# Patient Record
Sex: Male | Born: 1945 | Race: White | Hispanic: No | Marital: Married | Smoking: Former smoker
Health system: Southern US, Community
[De-identification: ages and names within clinical notes are randomized; demographics above are authoritative.]

## PROBLEM LIST (undated history)

## (undated) DIAGNOSIS — C3490 Malignant neoplasm of unspecified part of unspecified bronchus or lung: Secondary | ICD-10-CM

## (undated) DIAGNOSIS — Z9289 Personal history of other medical treatment: Secondary | ICD-10-CM

## (undated) DIAGNOSIS — IMO0001 Reserved for inherently not codable concepts without codable children: Secondary | ICD-10-CM

## (undated) DIAGNOSIS — M1990 Unspecified osteoarthritis, unspecified site: Secondary | ICD-10-CM

## (undated) DIAGNOSIS — IMO0002 Reserved for concepts with insufficient information to code with codable children: Secondary | ICD-10-CM

## (undated) DIAGNOSIS — J189 Pneumonia, unspecified organism: Secondary | ICD-10-CM

## (undated) DIAGNOSIS — R42 Dizziness and giddiness: Principal | ICD-10-CM

## (undated) DIAGNOSIS — H269 Unspecified cataract: Secondary | ICD-10-CM

## (undated) DIAGNOSIS — I10 Essential (primary) hypertension: Secondary | ICD-10-CM

## (undated) DIAGNOSIS — S83209A Unspecified tear of unspecified meniscus, current injury, unspecified knee, initial encounter: Secondary | ICD-10-CM

## (undated) DIAGNOSIS — D6481 Anemia due to antineoplastic chemotherapy: Secondary | ICD-10-CM

## (undated) DIAGNOSIS — T451X5A Adverse effect of antineoplastic and immunosuppressive drugs, initial encounter: Secondary | ICD-10-CM

## (undated) DIAGNOSIS — R1310 Dysphagia, unspecified: Secondary | ICD-10-CM

## (undated) DIAGNOSIS — I951 Orthostatic hypotension: Secondary | ICD-10-CM

## (undated) DIAGNOSIS — R269 Unspecified abnormalities of gait and mobility: Secondary | ICD-10-CM

## (undated) DIAGNOSIS — Z931 Gastrostomy status: Secondary | ICD-10-CM

## (undated) DIAGNOSIS — H9190 Unspecified hearing loss, unspecified ear: Secondary | ICD-10-CM

## (undated) DIAGNOSIS — N400 Enlarged prostate without lower urinary tract symptoms: Secondary | ICD-10-CM

## (undated) DIAGNOSIS — R251 Tremor, unspecified: Secondary | ICD-10-CM

## (undated) DIAGNOSIS — N179 Acute kidney failure, unspecified: Secondary | ICD-10-CM

## (undated) DIAGNOSIS — A419 Sepsis, unspecified organism: Secondary | ICD-10-CM

## (undated) HISTORY — PX: PORTACATH PLACEMENT: SHX2246

## (undated) HISTORY — PX: LARYNGOSCOPY: SUR817

## (undated) HISTORY — DX: Reserved for concepts with insufficient information to code with codable children: IMO0002

## (undated) HISTORY — PX: CATARACT EXTRACTION: SUR2

## (undated) HISTORY — DX: Pneumonia, unspecified organism: J18.9

## (undated) HISTORY — DX: Reserved for inherently not codable concepts without codable children: IMO0001

## (undated) HISTORY — DX: Unspecified hearing loss, unspecified ear: H91.90

## (undated) HISTORY — DX: Unspecified abnormalities of gait and mobility: R26.9

## (undated) HISTORY — PX: OTHER SURGICAL HISTORY: SHX169

## (undated) HISTORY — DX: Anemia due to antineoplastic chemotherapy: D64.81

## (undated) HISTORY — DX: Dizziness and giddiness: R42

## (undated) HISTORY — PX: TONSILLECTOMY: SUR1361

## (undated) HISTORY — DX: Unspecified osteoarthritis, unspecified site: M19.90

## (undated) HISTORY — DX: Tremor, unspecified: R25.1

## (undated) HISTORY — PX: BILE DUCT STENT PLACEMENT: SHX1227

## (undated) HISTORY — DX: Adverse effect of antineoplastic and immunosuppressive drugs, initial encounter: T45.1X5A

## (undated) HISTORY — DX: Sepsis, unspecified organism: A41.9

---

## 2003-08-28 ENCOUNTER — Encounter (INDEPENDENT_AMBULATORY_CARE_PROVIDER_SITE_OTHER): Payer: Self-pay | Admitting: *Deleted

## 2003-08-28 ENCOUNTER — Ambulatory Visit (HOSPITAL_COMMUNITY): Admission: RE | Admit: 2003-08-28 | Discharge: 2003-08-28 | Payer: Self-pay | Admitting: Gastroenterology

## 2006-03-29 HISTORY — PX: ROTATOR CUFF REPAIR: SHX139

## 2007-02-16 ENCOUNTER — Inpatient Hospital Stay (HOSPITAL_COMMUNITY): Admission: EM | Admit: 2007-02-16 | Discharge: 2007-02-18 | Payer: Self-pay | Admitting: Emergency Medicine

## 2007-02-16 HISTORY — PX: OTHER SURGICAL HISTORY: SHX169

## 2007-02-17 ENCOUNTER — Encounter (INDEPENDENT_AMBULATORY_CARE_PROVIDER_SITE_OTHER): Payer: Self-pay | Admitting: General Surgery

## 2008-12-05 ENCOUNTER — Ambulatory Visit (HOSPITAL_BASED_OUTPATIENT_CLINIC_OR_DEPARTMENT_OTHER): Admission: RE | Admit: 2008-12-05 | Discharge: 2008-12-05 | Payer: Self-pay | Admitting: Family Medicine

## 2008-12-05 ENCOUNTER — Encounter: Payer: Self-pay | Admitting: Pulmonary Disease

## 2008-12-05 ENCOUNTER — Ambulatory Visit: Payer: Self-pay | Admitting: Diagnostic Radiology

## 2008-12-10 DIAGNOSIS — M129 Arthropathy, unspecified: Secondary | ICD-10-CM | POA: Insufficient documentation

## 2008-12-11 ENCOUNTER — Ambulatory Visit: Payer: Self-pay | Admitting: Pulmonary Disease

## 2008-12-11 DIAGNOSIS — R599 Enlarged lymph nodes, unspecified: Secondary | ICD-10-CM | POA: Insufficient documentation

## 2008-12-11 DIAGNOSIS — C3492 Malignant neoplasm of unspecified part of left bronchus or lung: Secondary | ICD-10-CM

## 2008-12-17 ENCOUNTER — Ambulatory Visit (HOSPITAL_COMMUNITY): Admission: RE | Admit: 2008-12-17 | Discharge: 2008-12-17 | Payer: Self-pay | Admitting: Pulmonary Disease

## 2008-12-19 ENCOUNTER — Telehealth: Payer: Self-pay | Admitting: Internal Medicine

## 2008-12-20 ENCOUNTER — Ambulatory Visit: Payer: Self-pay | Admitting: Internal Medicine

## 2008-12-23 ENCOUNTER — Encounter: Payer: Self-pay | Admitting: Pulmonary Disease

## 2008-12-23 ENCOUNTER — Encounter (INDEPENDENT_AMBULATORY_CARE_PROVIDER_SITE_OTHER): Payer: Self-pay | Admitting: Interventional Radiology

## 2008-12-23 ENCOUNTER — Ambulatory Visit (HOSPITAL_COMMUNITY): Admission: RE | Admit: 2008-12-23 | Discharge: 2008-12-23 | Payer: Self-pay | Admitting: Pulmonary Disease

## 2008-12-26 ENCOUNTER — Ambulatory Visit: Payer: Self-pay | Admitting: Emergency Medicine

## 2008-12-26 ENCOUNTER — Encounter: Payer: Self-pay | Admitting: Pulmonary Disease

## 2008-12-26 ENCOUNTER — Ambulatory Visit: Payer: Self-pay | Admitting: Internal Medicine

## 2008-12-27 ENCOUNTER — Encounter: Payer: Self-pay | Admitting: Pulmonary Disease

## 2008-12-30 ENCOUNTER — Ambulatory Visit: Admission: RE | Admit: 2008-12-30 | Discharge: 2009-02-11 | Payer: Self-pay | Admitting: Radiation Oncology

## 2008-12-31 ENCOUNTER — Ambulatory Visit (HOSPITAL_COMMUNITY): Admission: RE | Admit: 2008-12-31 | Discharge: 2008-12-31 | Payer: Self-pay | Admitting: Internal Medicine

## 2008-12-31 ENCOUNTER — Encounter (INDEPENDENT_AMBULATORY_CARE_PROVIDER_SITE_OTHER): Payer: Self-pay | Admitting: Interventional Radiology

## 2008-12-31 ENCOUNTER — Encounter: Payer: Self-pay | Admitting: Pulmonary Disease

## 2009-01-01 ENCOUNTER — Ambulatory Visit (HOSPITAL_COMMUNITY): Admission: RE | Admit: 2009-01-01 | Discharge: 2009-01-01 | Payer: Self-pay | Admitting: Internal Medicine

## 2009-01-02 LAB — COMPREHENSIVE METABOLIC PANEL
ALT: 18 U/L (ref 0–53)
AST: 14 U/L (ref 0–37)
Albumin: 4 g/dL (ref 3.5–5.2)
BUN: 12 mg/dL (ref 6–23)
CO2: 25 mEq/L (ref 19–32)
Calcium: 9.4 mg/dL (ref 8.4–10.5)
Chloride: 102 mEq/L (ref 96–112)
Potassium: 4.2 mEq/L (ref 3.5–5.3)

## 2009-01-02 LAB — CBC WITH DIFFERENTIAL/PLATELET
BASO%: 0.6 % (ref 0.0–2.0)
Basophils Absolute: 0 10*3/uL (ref 0.0–0.1)
EOS%: 1.4 % (ref 0.0–7.0)
HCT: 33.1 % — ABNORMAL LOW (ref 38.4–49.9)
HGB: 11.3 g/dL — ABNORMAL LOW (ref 13.0–17.1)
MCH: 30.2 pg (ref 27.2–33.4)
MONO#: 1 10*3/uL — ABNORMAL HIGH (ref 0.1–0.9)
RDW: 12.6 % (ref 11.0–14.6)
WBC: 8 10*3/uL (ref 4.0–10.3)
lymph#: 1.5 10*3/uL (ref 0.9–3.3)

## 2009-01-03 ENCOUNTER — Ambulatory Visit: Payer: Self-pay | Admitting: Pulmonary Disease

## 2009-01-06 ENCOUNTER — Encounter: Payer: Self-pay | Admitting: Pulmonary Disease

## 2009-01-06 LAB — CBC WITH DIFFERENTIAL/PLATELET
BASO%: 0.1 % (ref 0.0–2.0)
EOS%: 0 % (ref 0.0–7.0)
HCT: 33.8 % — ABNORMAL LOW (ref 38.4–49.9)
MCH: 29.5 pg (ref 27.2–33.4)
MCHC: 33.7 g/dL (ref 32.0–36.0)
MONO#: 0.8 10*3/uL (ref 0.1–0.9)
RBC: 3.87 10*6/uL — ABNORMAL LOW (ref 4.20–5.82)
RDW: 12.8 % (ref 11.0–14.6)
WBC: 13.9 10*3/uL — ABNORMAL HIGH (ref 4.0–10.3)
lymph#: 1 10*3/uL (ref 0.9–3.3)
nRBC: 0 % (ref 0–0)

## 2009-01-06 LAB — COMPREHENSIVE METABOLIC PANEL
AST: 15 U/L (ref 0–37)
Albumin: 4.3 g/dL (ref 3.5–5.2)
Alkaline Phosphatase: 115 U/L (ref 39–117)
Potassium: 4.3 mEq/L (ref 3.5–5.3)
Sodium: 138 mEq/L (ref 135–145)
Total Bilirubin: 0.2 mg/dL — ABNORMAL LOW (ref 0.3–1.2)
Total Protein: 7.5 g/dL (ref 6.0–8.3)

## 2009-01-06 LAB — UA PROTEIN, DIPSTICK - CHCC: Protein, Urine: NEGATIVE mg/dL

## 2009-01-13 LAB — COMPREHENSIVE METABOLIC PANEL
AST: 23 U/L (ref 0–37)
Albumin: 3.4 g/dL — ABNORMAL LOW (ref 3.5–5.2)
Alkaline Phosphatase: 94 U/L (ref 39–117)
BUN: 14 mg/dL (ref 6–23)
Glucose, Bld: 117 mg/dL — ABNORMAL HIGH (ref 70–99)
Potassium: 3.7 mEq/L (ref 3.5–5.3)
Sodium: 134 mEq/L — ABNORMAL LOW (ref 135–145)
Total Bilirubin: 0.5 mg/dL (ref 0.3–1.2)

## 2009-01-13 LAB — CBC WITH DIFFERENTIAL/PLATELET
Basophils Absolute: 0 10*3/uL (ref 0.0–0.1)
EOS%: 8.6 % — ABNORMAL HIGH (ref 0.0–7.0)
Eosinophils Absolute: 0.4 10*3/uL (ref 0.0–0.5)
LYMPH%: 26.9 % (ref 14.0–49.0)
MCH: 30.1 pg (ref 27.2–33.4)
MCV: 86.4 fL (ref 79.3–98.0)
MONO%: 13.7 % (ref 0.0–14.0)
NEUT#: 2.1 10*3/uL (ref 1.5–6.5)
Platelets: 256 10*3/uL (ref 140–400)
RBC: 3.8 10*6/uL — ABNORMAL LOW (ref 4.20–5.82)
RDW: 12.7 % (ref 11.0–14.6)

## 2009-01-16 ENCOUNTER — Ambulatory Visit (HOSPITAL_COMMUNITY): Admission: RE | Admit: 2009-01-16 | Discharge: 2009-01-16 | Payer: Self-pay | Admitting: Internal Medicine

## 2009-01-20 ENCOUNTER — Ambulatory Visit: Payer: Self-pay | Admitting: Internal Medicine

## 2009-01-20 LAB — CBC WITH DIFFERENTIAL/PLATELET
Basophils Absolute: 0 10*3/uL (ref 0.0–0.1)
Eosinophils Absolute: 0.3 10*3/uL (ref 0.0–0.5)
HCT: 32 % — ABNORMAL LOW (ref 38.4–49.9)
HGB: 10.9 g/dL — ABNORMAL LOW (ref 13.0–17.1)
LYMPH%: 21.3 % (ref 14.0–49.0)
MCV: 88.3 fL (ref 79.3–98.0)
MONO#: 0.5 10*3/uL (ref 0.1–0.9)
MONO%: 10.1 % (ref 0.0–14.0)
NEUT#: 3 10*3/uL (ref 1.5–6.5)
Platelets: 111 10*3/uL — ABNORMAL LOW (ref 140–400)
WBC: 4.8 10*3/uL (ref 4.0–10.3)

## 2009-01-20 LAB — COMPREHENSIVE METABOLIC PANEL
Albumin: 3.7 g/dL (ref 3.5–5.2)
Alkaline Phosphatase: 99 U/L (ref 39–117)
BUN: 13 mg/dL (ref 6–23)
CO2: 26 mEq/L (ref 19–32)
Glucose, Bld: 123 mg/dL — ABNORMAL HIGH (ref 70–99)
Potassium: 3.8 mEq/L (ref 3.5–5.3)
Sodium: 138 mEq/L (ref 135–145)
Total Bilirubin: 0.5 mg/dL (ref 0.3–1.2)
Total Protein: 6.4 g/dL (ref 6.0–8.3)

## 2009-01-28 ENCOUNTER — Encounter: Payer: Self-pay | Admitting: Pulmonary Disease

## 2009-01-28 LAB — COMPREHENSIVE METABOLIC PANEL
Albumin: 4.5 g/dL (ref 3.5–5.2)
Alkaline Phosphatase: 74 U/L (ref 39–117)
BUN: 16 mg/dL (ref 6–23)
Creatinine, Ser: 0.72 mg/dL (ref 0.40–1.50)
Glucose, Bld: 164 mg/dL — ABNORMAL HIGH (ref 70–99)
Total Bilirubin: 0.4 mg/dL (ref 0.3–1.2)

## 2009-01-28 LAB — CBC WITH DIFFERENTIAL/PLATELET
Basophils Absolute: 0 10*3/uL (ref 0.0–0.1)
Eosinophils Absolute: 0 10*3/uL (ref 0.0–0.5)
HCT: 34.5 % — ABNORMAL LOW (ref 38.4–49.9)
HGB: 11.6 g/dL — ABNORMAL LOW (ref 13.0–17.1)
MCV: 87.1 fL (ref 79.3–98.0)
MONO%: 4.2 % (ref 0.0–14.0)
NEUT#: 5.3 10*3/uL (ref 1.5–6.5)
NEUT%: 89.7 % — ABNORMAL HIGH (ref 39.0–75.0)
Platelets: 186 10*3/uL (ref 140–400)
RDW: 15.1 % — ABNORMAL HIGH (ref 11.0–14.6)

## 2009-01-29 ENCOUNTER — Encounter: Payer: Self-pay | Admitting: Pulmonary Disease

## 2009-01-31 ENCOUNTER — Ambulatory Visit: Payer: Self-pay | Admitting: Internal Medicine

## 2009-02-04 LAB — COMPREHENSIVE METABOLIC PANEL
Alkaline Phosphatase: 74 U/L (ref 39–117)
BUN: 13 mg/dL (ref 6–23)
Glucose, Bld: 97 mg/dL (ref 70–99)
Sodium: 134 mEq/L — ABNORMAL LOW (ref 135–145)
Total Bilirubin: 0.7 mg/dL (ref 0.3–1.2)

## 2009-02-04 LAB — CBC WITH DIFFERENTIAL/PLATELET
Basophils Absolute: 0 10*3/uL (ref 0.0–0.1)
Eosinophils Absolute: 0.2 10*3/uL (ref 0.0–0.5)
LYMPH%: 25.6 % (ref 14.0–49.0)
MCV: 87.7 fL (ref 79.3–98.0)
MONO%: 16.9 % — ABNORMAL HIGH (ref 0.0–14.0)
NEUT#: 1.4 10*3/uL — ABNORMAL LOW (ref 1.5–6.5)
Platelets: 121 10*3/uL — ABNORMAL LOW (ref 140–400)
RBC: 3.69 10*6/uL — ABNORMAL LOW (ref 4.20–5.82)

## 2009-02-11 LAB — CBC WITH DIFFERENTIAL/PLATELET
Basophils Absolute: 0 10*3/uL (ref 0.0–0.1)
Eosinophils Absolute: 0.5 10*3/uL (ref 0.0–0.5)
HCT: 30.7 % — ABNORMAL LOW (ref 38.4–49.9)
HGB: 10.7 g/dL — ABNORMAL LOW (ref 13.0–17.1)
LYMPH%: 16.1 % (ref 14.0–49.0)
MCV: 87.8 fL (ref 79.3–98.0)
MONO%: 13.2 % (ref 0.0–14.0)
NEUT#: 2.8 10*3/uL (ref 1.5–6.5)
Platelets: 100 10*3/uL — ABNORMAL LOW (ref 140–400)

## 2009-02-11 LAB — UA PROTEIN, DIPSTICK - CHCC: Protein, Urine: NEGATIVE mg/dL

## 2009-02-11 LAB — COMPREHENSIVE METABOLIC PANEL
Albumin: 4 g/dL (ref 3.5–5.2)
Alkaline Phosphatase: 65 U/L (ref 39–117)
BUN: 14 mg/dL (ref 6–23)
CO2: 25 mEq/L (ref 19–32)
Glucose, Bld: 122 mg/dL — ABNORMAL HIGH (ref 70–99)
Total Bilirubin: 0.6 mg/dL (ref 0.3–1.2)

## 2009-02-13 ENCOUNTER — Encounter: Payer: Self-pay | Admitting: Pulmonary Disease

## 2009-02-18 ENCOUNTER — Encounter: Payer: Self-pay | Admitting: Pulmonary Disease

## 2009-02-18 LAB — CBC WITH DIFFERENTIAL/PLATELET
BASO%: 0.1 % (ref 0.0–2.0)
Eosinophils Absolute: 0 10*3/uL (ref 0.0–0.5)
HCT: 31.6 % — ABNORMAL LOW (ref 38.4–49.9)
LYMPH%: 6.2 % — ABNORMAL LOW (ref 14.0–49.0)
MONO#: 0.3 10*3/uL (ref 0.1–0.9)
NEUT#: 4.7 10*3/uL (ref 1.5–6.5)
Platelets: 217 10*3/uL (ref 140–400)
RBC: 3.49 10*6/uL — ABNORMAL LOW (ref 4.20–5.82)
WBC: 5.3 10*3/uL (ref 4.0–10.3)
lymph#: 0.3 10*3/uL — ABNORMAL LOW (ref 0.9–3.3)

## 2009-02-18 LAB — COMPREHENSIVE METABOLIC PANEL
ALT: 44 U/L (ref 0–53)
Albumin: 4.7 g/dL (ref 3.5–5.2)
CO2: 19 mEq/L (ref 19–32)
Calcium: 10.1 mg/dL (ref 8.4–10.5)
Chloride: 104 mEq/L (ref 96–112)
Glucose, Bld: 165 mg/dL — ABNORMAL HIGH (ref 70–99)
Sodium: 139 mEq/L (ref 135–145)
Total Bilirubin: 0.4 mg/dL (ref 0.3–1.2)
Total Protein: 6.8 g/dL (ref 6.0–8.3)

## 2009-02-18 LAB — UA PROTEIN, DIPSTICK - CHCC: Protein, Urine: NEGATIVE mg/dL

## 2009-02-25 LAB — COMPREHENSIVE METABOLIC PANEL
AST: 23 U/L (ref 0–37)
Albumin: 4.1 g/dL (ref 3.5–5.2)
BUN: 18 mg/dL (ref 6–23)
CO2: 25 mEq/L (ref 19–32)
Calcium: 9.8 mg/dL (ref 8.4–10.5)
Chloride: 105 mEq/L (ref 96–112)
Creatinine, Ser: 0.9 mg/dL (ref 0.40–1.50)
Glucose, Bld: 103 mg/dL — ABNORMAL HIGH (ref 70–99)
Potassium: 3.8 mEq/L (ref 3.5–5.3)

## 2009-02-25 LAB — CBC WITH DIFFERENTIAL/PLATELET
Basophils Absolute: 0 10*3/uL (ref 0.0–0.1)
EOS%: 8.8 % — ABNORMAL HIGH (ref 0.0–7.0)
Eosinophils Absolute: 0.3 10*3/uL (ref 0.0–0.5)
HCT: 30.9 % — ABNORMAL LOW (ref 38.4–49.9)
HGB: 11 g/dL — ABNORMAL LOW (ref 13.0–17.1)
MCH: 31.7 pg (ref 27.2–33.4)
MONO#: 0.5 10*3/uL (ref 0.1–0.9)
NEUT#: 1.3 10*3/uL — ABNORMAL LOW (ref 1.5–6.5)
NEUT%: 45.6 % (ref 39.0–75.0)
RDW: 20.4 % — ABNORMAL HIGH (ref 11.0–14.6)
lymph#: 0.8 10*3/uL — ABNORMAL LOW (ref 0.9–3.3)

## 2009-02-25 LAB — UA PROTEIN, DIPSTICK - CHCC: Protein, Urine: NEGATIVE mg/dL

## 2009-03-04 ENCOUNTER — Ambulatory Visit: Payer: Self-pay | Admitting: Internal Medicine

## 2009-03-04 LAB — CBC WITH DIFFERENTIAL/PLATELET
BASO%: 0.1 % (ref 0.0–2.0)
LYMPH%: 13.7 % — ABNORMAL LOW (ref 14.0–49.0)
MCH: 32.5 pg (ref 27.2–33.4)
MCHC: 35.5 g/dL (ref 32.0–36.0)
MCV: 91.6 fL (ref 79.3–98.0)
MONO%: 13.4 % (ref 0.0–14.0)
NEUT%: 64.4 % (ref 39.0–75.0)
Platelets: 114 10*3/uL — ABNORMAL LOW (ref 140–400)
RBC: 3.12 10*6/uL — ABNORMAL LOW (ref 4.20–5.82)

## 2009-03-04 LAB — COMPREHENSIVE METABOLIC PANEL
ALT: 38 U/L (ref 0–53)
Alkaline Phosphatase: 61 U/L (ref 39–117)
Creatinine, Ser: 1.29 mg/dL (ref 0.40–1.50)
Sodium: 140 mEq/L (ref 135–145)
Total Bilirubin: 0.6 mg/dL (ref 0.3–1.2)
Total Protein: 6.3 g/dL (ref 6.0–8.3)

## 2009-03-07 ENCOUNTER — Emergency Department (HOSPITAL_COMMUNITY): Admission: EM | Admit: 2009-03-07 | Discharge: 2009-03-07 | Payer: Self-pay | Admitting: Emergency Medicine

## 2009-03-11 ENCOUNTER — Ambulatory Visit (HOSPITAL_COMMUNITY): Admission: RE | Admit: 2009-03-11 | Discharge: 2009-03-11 | Payer: Self-pay | Admitting: Internal Medicine

## 2009-03-12 ENCOUNTER — Encounter: Payer: Self-pay | Admitting: Pulmonary Disease

## 2009-03-12 LAB — CBC WITH DIFFERENTIAL/PLATELET
Eosinophils Absolute: 0 10*3/uL (ref 0.0–0.5)
HCT: 29 % — ABNORMAL LOW (ref 38.4–49.9)
LYMPH%: 9.2 % — ABNORMAL LOW (ref 14.0–49.0)
MCV: 93.2 fL (ref 79.3–98.0)
MONO%: 10.4 % (ref 0.0–14.0)
NEUT#: 4.7 10*3/uL (ref 1.5–6.5)
NEUT%: 80.2 % — ABNORMAL HIGH (ref 39.0–75.0)
Platelets: 240 10*3/uL (ref 140–400)
RBC: 3.11 10*6/uL — ABNORMAL LOW (ref 4.20–5.82)
nRBC: 0 % (ref 0–0)

## 2009-03-12 LAB — COMPREHENSIVE METABOLIC PANEL
AST: 24 U/L (ref 0–37)
Albumin: 3.9 g/dL (ref 3.5–5.2)
BUN: 16 mg/dL (ref 6–23)
CO2: 25 mEq/L (ref 19–32)
Calcium: 9.8 mg/dL (ref 8.4–10.5)
Chloride: 103 mEq/L (ref 96–112)
Creatinine, Ser: 0.83 mg/dL (ref 0.40–1.50)
Glucose, Bld: 150 mg/dL — ABNORMAL HIGH (ref 70–99)
Potassium: 4 mEq/L (ref 3.5–5.3)

## 2009-03-18 LAB — CBC WITH DIFFERENTIAL/PLATELET
Basophils Absolute: 0 10*3/uL (ref 0.0–0.1)
EOS%: 7.5 % — ABNORMAL HIGH (ref 0.0–7.0)
Eosinophils Absolute: 0.3 10*3/uL (ref 0.0–0.5)
HCT: 27.2 % — ABNORMAL LOW (ref 38.4–49.9)
HGB: 9.6 g/dL — ABNORMAL LOW (ref 13.0–17.1)
MONO#: 0.3 10*3/uL (ref 0.1–0.9)
NEUT#: 2.2 10*3/uL (ref 1.5–6.5)
NEUT%: 62.8 % (ref 39.0–75.0)
RDW: 24 % — ABNORMAL HIGH (ref 11.0–14.6)
WBC: 3.6 10*3/uL — ABNORMAL LOW (ref 4.0–10.3)
lymph#: 0.7 10*3/uL — ABNORMAL LOW (ref 0.9–3.3)

## 2009-03-18 LAB — COMPREHENSIVE METABOLIC PANEL
AST: 21 U/L (ref 0–37)
Albumin: 3.8 g/dL (ref 3.5–5.2)
BUN: 12 mg/dL (ref 6–23)
CO2: 27 mEq/L (ref 19–32)
Calcium: 9.4 mg/dL (ref 8.4–10.5)
Chloride: 108 mEq/L (ref 96–112)
Creatinine, Ser: 0.76 mg/dL (ref 0.40–1.50)
Potassium: 3.9 mEq/L (ref 3.5–5.3)

## 2009-03-18 LAB — UA PROTEIN, DIPSTICK - CHCC: Protein, Urine: NEGATIVE mg/dL

## 2009-03-26 LAB — COMPREHENSIVE METABOLIC PANEL
ALT: 46 U/L (ref 0–53)
AST: 31 U/L (ref 0–37)
Albumin: 4 g/dL (ref 3.5–5.2)
Alkaline Phosphatase: 75 U/L (ref 39–117)
BUN: 9 mg/dL (ref 6–23)
CO2: 22 mEq/L (ref 19–32)
Calcium: 8.6 mg/dL (ref 8.4–10.5)
Chloride: 106 mEq/L (ref 96–112)
Creatinine, Ser: 0.82 mg/dL (ref 0.40–1.50)
Glucose, Bld: 99 mg/dL (ref 70–99)
Potassium: 4.2 mEq/L (ref 3.5–5.3)
Sodium: 139 mEq/L (ref 135–145)
Total Bilirubin: 0.6 mg/dL (ref 0.3–1.2)
Total Protein: 5.8 g/dL — ABNORMAL LOW (ref 6.0–8.3)

## 2009-03-26 LAB — CBC WITH DIFFERENTIAL/PLATELET
EOS%: 11.3 % — ABNORMAL HIGH (ref 0.0–7.0)
HGB: 10.2 g/dL — ABNORMAL LOW (ref 13.0–17.1)
MCH: 34.4 pg — ABNORMAL HIGH (ref 27.2–33.4)
MCV: 96.9 fL (ref 79.3–98.0)
MONO%: 13.1 % (ref 0.0–14.0)
NEUT#: 2.3 10*3/uL (ref 1.5–6.5)
RBC: 2.96 10*6/uL — ABNORMAL LOW (ref 4.20–5.82)
RDW: 24.2 % — ABNORMAL HIGH (ref 11.0–14.6)
lymph#: 0.5 10*3/uL — ABNORMAL LOW (ref 0.9–3.3)

## 2009-03-27 ENCOUNTER — Ambulatory Visit: Payer: Self-pay | Admitting: Internal Medicine

## 2009-04-02 ENCOUNTER — Encounter: Payer: Self-pay | Admitting: Pulmonary Disease

## 2009-04-02 LAB — COMPREHENSIVE METABOLIC PANEL
Albumin: 4.1 g/dL (ref 3.5–5.2)
Alkaline Phosphatase: 75 U/L (ref 39–117)
BUN: 15 mg/dL (ref 6–23)
CO2: 23 mEq/L (ref 19–32)
Calcium: 8.8 mg/dL (ref 8.4–10.5)
Chloride: 100 mEq/L (ref 96–112)
Glucose, Bld: 147 mg/dL — ABNORMAL HIGH (ref 70–99)
Potassium: 4.1 mEq/L (ref 3.5–5.3)
Sodium: 134 mEq/L — ABNORMAL LOW (ref 135–145)
Total Protein: 5.9 g/dL — ABNORMAL LOW (ref 6.0–8.3)

## 2009-04-02 LAB — CBC WITH DIFFERENTIAL/PLATELET
EOS%: 0.2 % (ref 0.0–7.0)
Eosinophils Absolute: 0 10*3/uL (ref 0.0–0.5)
MCH: 35.4 pg — ABNORMAL HIGH (ref 27.2–33.4)
MCV: 99.5 fL — ABNORMAL HIGH (ref 79.3–98.0)
MONO%: 9.9 % (ref 0.0–14.0)
NEUT#: 5.1 10*3/uL (ref 1.5–6.5)
RBC: 2.81 10*6/uL — ABNORMAL LOW (ref 4.20–5.82)
RDW: 24.7 % — ABNORMAL HIGH (ref 11.0–14.6)
lymph#: 0.5 10*3/uL — ABNORMAL LOW (ref 0.9–3.3)

## 2009-04-02 LAB — UA PROTEIN, DIPSTICK - CHCC: Protein, Urine: NEGATIVE mg/dL

## 2009-04-08 LAB — CBC WITH DIFFERENTIAL/PLATELET
BASO%: 0.5 % (ref 0.0–2.0)
Eosinophils Absolute: 0.5 10*3/uL (ref 0.0–0.5)
MCHC: 34.6 g/dL (ref 32.0–36.0)
MONO#: 0.3 10*3/uL (ref 0.1–0.9)
NEUT#: 2.2 10*3/uL (ref 1.5–6.5)
RBC: 2.89 10*6/uL — ABNORMAL LOW (ref 4.20–5.82)
WBC: 3.8 10*3/uL — ABNORMAL LOW (ref 4.0–10.3)
lymph#: 0.8 10*3/uL — ABNORMAL LOW (ref 0.9–3.3)

## 2009-04-08 LAB — COMPREHENSIVE METABOLIC PANEL
ALT: 17 U/L (ref 0–53)
Albumin: 3.9 g/dL (ref 3.5–5.2)
CO2: 24 mEq/L (ref 19–32)
Calcium: 9.3 mg/dL (ref 8.4–10.5)
Chloride: 108 mEq/L (ref 96–112)
Potassium: 3.7 mEq/L (ref 3.5–5.3)
Sodium: 143 mEq/L (ref 135–145)
Total Protein: 5.7 g/dL — ABNORMAL LOW (ref 6.0–8.3)

## 2009-04-08 LAB — UA PROTEIN, DIPSTICK - CHCC: Protein, Urine: NEGATIVE mg/dL

## 2009-04-15 ENCOUNTER — Inpatient Hospital Stay (HOSPITAL_COMMUNITY): Admission: EM | Admit: 2009-04-15 | Discharge: 2009-04-17 | Payer: Self-pay | Admitting: Emergency Medicine

## 2009-04-23 ENCOUNTER — Encounter: Payer: Self-pay | Admitting: Pulmonary Disease

## 2009-04-23 LAB — CBC WITH DIFFERENTIAL/PLATELET
BASO%: 0.2 % (ref 0.0–2.0)
EOS%: 0 % (ref 0.0–7.0)
HCT: 27.8 % — ABNORMAL LOW (ref 38.4–49.9)
MCH: 33.3 pg (ref 27.2–33.4)
MCHC: 33.1 g/dL (ref 32.0–36.0)
NEUT%: 82.9 % — ABNORMAL HIGH (ref 39.0–75.0)
RBC: 2.76 10*6/uL — ABNORMAL LOW (ref 4.20–5.82)
RDW: 17.4 % — ABNORMAL HIGH (ref 11.0–14.6)
lymph#: 0.4 10*3/uL — ABNORMAL LOW (ref 0.9–3.3)

## 2009-04-23 LAB — COMPREHENSIVE METABOLIC PANEL
Alkaline Phosphatase: 78 U/L (ref 39–117)
Glucose, Bld: 164 mg/dL — ABNORMAL HIGH (ref 70–99)
Sodium: 138 mEq/L (ref 135–145)
Total Bilirubin: 0.4 mg/dL (ref 0.3–1.2)
Total Protein: 6 g/dL (ref 6.0–8.3)

## 2009-04-28 ENCOUNTER — Ambulatory Visit: Payer: Self-pay | Admitting: Internal Medicine

## 2009-04-30 LAB — CBC WITH DIFFERENTIAL/PLATELET
Basophils Absolute: 0 10*3/uL (ref 0.0–0.1)
EOS%: 5.7 % (ref 0.0–7.0)
HCT: 28 % — ABNORMAL LOW (ref 38.4–49.9)
HGB: 10 g/dL — ABNORMAL LOW (ref 13.0–17.1)
MCH: 36.2 pg — ABNORMAL HIGH (ref 27.2–33.4)
MCV: 101.8 fL — ABNORMAL HIGH (ref 79.3–98.0)
NEUT%: 53.3 % (ref 39.0–75.0)
lymph#: 0.8 10*3/uL — ABNORMAL LOW (ref 0.9–3.3)

## 2009-04-30 LAB — UA PROTEIN, DIPSTICK - CHCC: Protein, Urine: NEGATIVE mg/dL

## 2009-04-30 LAB — COMPREHENSIVE METABOLIC PANEL
AST: 18 U/L (ref 0–37)
BUN: 22 mg/dL (ref 6–23)
Calcium: 10 mg/dL (ref 8.4–10.5)
Chloride: 105 mEq/L (ref 96–112)
Creatinine, Ser: 0.87 mg/dL (ref 0.40–1.50)
Glucose, Bld: 93 mg/dL (ref 70–99)

## 2009-05-07 LAB — CBC WITH DIFFERENTIAL/PLATELET
BASO%: 0.6 % (ref 0.0–2.0)
Eosinophils Absolute: 0.3 10*3/uL (ref 0.0–0.5)
MONO#: 0.7 10*3/uL (ref 0.1–0.9)
NEUT#: 2 10*3/uL (ref 1.5–6.5)
Platelets: 81 10*3/uL — ABNORMAL LOW (ref 140–400)
RBC: 2.57 10*6/uL — ABNORMAL LOW (ref 4.20–5.82)
RDW: 15.3 % — ABNORMAL HIGH (ref 11.0–14.6)
WBC: 3.8 10*3/uL — ABNORMAL LOW (ref 4.0–10.3)
lymph#: 0.8 10*3/uL — ABNORMAL LOW (ref 0.9–3.3)

## 2009-05-07 LAB — COMPREHENSIVE METABOLIC PANEL
ALT: 25 U/L (ref 0–53)
AST: 21 U/L (ref 0–37)
Albumin: 4.1 g/dL (ref 3.5–5.2)
Alkaline Phosphatase: 84 U/L (ref 39–117)
Glucose, Bld: 71 mg/dL (ref 70–99)
Potassium: 3.3 mEq/L — ABNORMAL LOW (ref 3.5–5.3)
Sodium: 141 mEq/L (ref 135–145)
Total Bilirubin: 0.5 mg/dL (ref 0.3–1.2)
Total Protein: 6.2 g/dL (ref 6.0–8.3)

## 2009-05-07 LAB — UA PROTEIN, DIPSTICK - CHCC: Protein, Urine: NEGATIVE mg/dL

## 2009-05-13 ENCOUNTER — Encounter: Payer: Self-pay | Admitting: Pulmonary Disease

## 2009-05-13 LAB — CBC WITH DIFFERENTIAL/PLATELET
Basophils Absolute: 0 10*3/uL (ref 0.0–0.1)
EOS%: 0 % (ref 0.0–7.0)
Eosinophils Absolute: 0 10*3/uL (ref 0.0–0.5)
HCT: 27.1 % — ABNORMAL LOW (ref 38.4–49.9)
HGB: 9.2 g/dL — ABNORMAL LOW (ref 13.0–17.1)
MCH: 34.5 pg — ABNORMAL HIGH (ref 27.2–33.4)
MCV: 101.5 fL — ABNORMAL HIGH (ref 79.3–98.0)
MONO%: 4.5 % (ref 0.0–14.0)
NEUT#: 3.9 10*3/uL (ref 1.5–6.5)
NEUT%: 87.9 % — ABNORMAL HIGH (ref 39.0–75.0)
RDW: 17.3 % — ABNORMAL HIGH (ref 11.0–14.6)

## 2009-05-13 LAB — COMPREHENSIVE METABOLIC PANEL
AST: 29 U/L (ref 0–37)
Albumin: 4.6 g/dL (ref 3.5–5.2)
Alkaline Phosphatase: 74 U/L (ref 39–117)
BUN: 13 mg/dL (ref 6–23)
Creatinine, Ser: 0.8 mg/dL (ref 0.40–1.50)
Glucose, Bld: 111 mg/dL — ABNORMAL HIGH (ref 70–99)
Total Bilirubin: 0.5 mg/dL (ref 0.3–1.2)

## 2009-05-21 LAB — CBC WITH DIFFERENTIAL/PLATELET
Eosinophils Absolute: 0.1 10*3/uL (ref 0.0–0.5)
HCT: 28.2 % — ABNORMAL LOW (ref 38.4–49.9)
LYMPH%: 38 % (ref 14.0–49.0)
MCHC: 34.8 g/dL (ref 32.0–36.0)
MCV: 99.6 fL — ABNORMAL HIGH (ref 79.3–98.0)
MONO#: 0.6 10*3/uL (ref 0.1–0.9)
MONO%: 26.8 % — ABNORMAL HIGH (ref 0.0–14.0)
NEUT#: 0.6 10*3/uL — ABNORMAL LOW (ref 1.5–6.5)
NEUT%: 28.3 % — ABNORMAL LOW (ref 39.0–75.0)
Platelets: 120 10*3/uL — ABNORMAL LOW (ref 140–400)
WBC: 2.1 10*3/uL — ABNORMAL LOW (ref 4.0–10.3)

## 2009-05-21 LAB — COMPREHENSIVE METABOLIC PANEL
BUN: 18 mg/dL (ref 6–23)
CO2: 27 mEq/L (ref 19–32)
Calcium: 9.8 mg/dL (ref 8.4–10.5)
Chloride: 102 mEq/L (ref 96–112)
Creatinine, Ser: 0.9 mg/dL (ref 0.40–1.50)
Total Bilirubin: 0.5 mg/dL (ref 0.3–1.2)

## 2009-05-30 ENCOUNTER — Ambulatory Visit: Payer: Self-pay | Admitting: Internal Medicine

## 2009-06-03 ENCOUNTER — Encounter: Payer: Self-pay | Admitting: Pulmonary Disease

## 2009-06-03 LAB — CBC WITH DIFFERENTIAL/PLATELET
BASO%: 0 % (ref 0.0–2.0)
LYMPH%: 8.4 % — ABNORMAL LOW (ref 14.0–49.0)
MCHC: 34.5 g/dL (ref 32.0–36.0)
MONO#: 0.3 10*3/uL (ref 0.1–0.9)
Platelets: 341 10*3/uL (ref 140–400)
RBC: 2.95 10*6/uL — ABNORMAL LOW (ref 4.20–5.82)
RDW: 17.7 % — ABNORMAL HIGH (ref 11.0–14.6)
WBC: 4.8 10*3/uL (ref 4.0–10.3)

## 2009-06-03 LAB — COMPREHENSIVE METABOLIC PANEL
ALT: 19 U/L (ref 0–53)
AST: 21 U/L (ref 0–37)
Calcium: 10.4 mg/dL (ref 8.4–10.5)
Chloride: 100 mEq/L (ref 96–112)
Creatinine, Ser: 0.93 mg/dL (ref 0.40–1.50)
Total Bilirubin: 0.4 mg/dL (ref 0.3–1.2)

## 2009-06-03 LAB — UA PROTEIN, DIPSTICK - CHCC: Protein, Urine: NEGATIVE mg/dL

## 2009-06-25 LAB — UA PROTEIN, DIPSTICK - CHCC: Protein, Urine: NEGATIVE mg/dL

## 2009-06-25 LAB — CBC WITH DIFFERENTIAL/PLATELET
Eosinophils Absolute: 0 10*3/uL (ref 0.0–0.5)
HCT: 30.9 % — ABNORMAL LOW (ref 38.4–49.9)
LYMPH%: 6.8 % — ABNORMAL LOW (ref 14.0–49.0)
MONO#: 0.2 10*3/uL (ref 0.1–0.9)
NEUT#: 6.5 10*3/uL (ref 1.5–6.5)
NEUT%: 89.8 % — ABNORMAL HIGH (ref 39.0–75.0)
Platelets: 253 10*3/uL (ref 140–400)
WBC: 7.2 10*3/uL (ref 4.0–10.3)

## 2009-06-25 LAB — COMPREHENSIVE METABOLIC PANEL
CO2: 23 mEq/L (ref 19–32)
Creatinine, Ser: 0.94 mg/dL (ref 0.40–1.50)
Glucose, Bld: 177 mg/dL — ABNORMAL HIGH (ref 70–99)
Total Bilirubin: 0.3 mg/dL (ref 0.3–1.2)

## 2009-07-14 ENCOUNTER — Ambulatory Visit (HOSPITAL_COMMUNITY): Admission: RE | Admit: 2009-07-14 | Discharge: 2009-07-14 | Payer: Self-pay | Admitting: Internal Medicine

## 2009-07-14 ENCOUNTER — Ambulatory Visit: Payer: Self-pay | Admitting: Internal Medicine

## 2009-07-16 ENCOUNTER — Encounter: Payer: Self-pay | Admitting: Pulmonary Disease

## 2009-07-16 LAB — COMPREHENSIVE METABOLIC PANEL
ALT: 24 U/L (ref 0–53)
Alkaline Phosphatase: 66 U/L (ref 39–117)
CO2: 23 mEq/L (ref 19–32)
Sodium: 137 mEq/L (ref 135–145)
Total Bilirubin: 0.5 mg/dL (ref 0.3–1.2)
Total Protein: 6.9 g/dL (ref 6.0–8.3)

## 2009-07-16 LAB — CBC WITH DIFFERENTIAL/PLATELET
BASO%: 0 % (ref 0.0–2.0)
LYMPH%: 8.7 % — ABNORMAL LOW (ref 14.0–49.0)
MCHC: 33 g/dL (ref 32.0–36.0)
MCV: 101.2 fL — ABNORMAL HIGH (ref 79.3–98.0)
MONO%: 3.4 % (ref 0.0–14.0)
Platelets: 285 10*3/uL (ref 140–400)
RBC: 3.23 10*6/uL — ABNORMAL LOW (ref 4.20–5.82)

## 2009-07-16 LAB — UA PROTEIN, DIPSTICK - CHCC: Protein, Urine: NEGATIVE mg/dL

## 2009-08-06 LAB — COMPREHENSIVE METABOLIC PANEL
AST: 33 U/L (ref 0–37)
Albumin: 4.2 g/dL (ref 3.5–5.2)
BUN: 13 mg/dL (ref 6–23)
CO2: 30 mEq/L (ref 19–32)
Calcium: 9.7 mg/dL (ref 8.4–10.5)
Chloride: 101 mEq/L (ref 96–112)
Glucose, Bld: 154 mg/dL — ABNORMAL HIGH (ref 70–99)
Potassium: 3.7 mEq/L (ref 3.5–5.3)

## 2009-08-06 LAB — CBC WITH DIFFERENTIAL/PLATELET
BASO%: 0.2 % (ref 0.0–2.0)
Basophils Absolute: 0 10*3/uL (ref 0.0–0.1)
EOS%: 0 % (ref 0.0–7.0)
HGB: 11.1 g/dL — ABNORMAL LOW (ref 13.0–17.1)
MCH: 33.8 pg — ABNORMAL HIGH (ref 27.2–33.4)
MONO#: 0 10*3/uL — ABNORMAL LOW (ref 0.1–0.9)
RDW: 15.4 % — ABNORMAL HIGH (ref 11.0–14.6)
WBC: 4.6 10*3/uL (ref 4.0–10.3)
lymph#: 0.3 10*3/uL — ABNORMAL LOW (ref 0.9–3.3)

## 2009-08-26 ENCOUNTER — Ambulatory Visit: Payer: Self-pay | Admitting: Internal Medicine

## 2009-08-27 LAB — COMPREHENSIVE METABOLIC PANEL
ALT: 31 U/L (ref 0–53)
AST: 26 U/L (ref 0–37)
CO2: 28 mEq/L (ref 19–32)
Calcium: 10.1 mg/dL (ref 8.4–10.5)
Chloride: 104 mEq/L (ref 96–112)
Creatinine, Ser: 1.12 mg/dL (ref 0.40–1.50)
Sodium: 140 mEq/L (ref 135–145)
Total Bilirubin: 0.7 mg/dL (ref 0.3–1.2)
Total Protein: 6.7 g/dL (ref 6.0–8.3)

## 2009-08-27 LAB — UA PROTEIN, DIPSTICK - CHCC: Protein, Urine: NEGATIVE mg/dL

## 2009-08-27 LAB — CBC WITH DIFFERENTIAL/PLATELET
BASO%: 0.2 % (ref 0.0–2.0)
EOS%: 0 % (ref 0.0–7.0)
MCH: 33.6 pg — ABNORMAL HIGH (ref 27.2–33.4)
MCV: 100.6 fL — ABNORMAL HIGH (ref 79.3–98.0)
MONO%: 7.8 % (ref 0.0–14.0)
NEUT#: 4.6 10*3/uL (ref 1.5–6.5)
RBC: 3.39 10*6/uL — ABNORMAL LOW (ref 4.20–5.82)
RDW: 15.9 % — ABNORMAL HIGH (ref 11.0–14.6)

## 2009-09-12 ENCOUNTER — Ambulatory Visit (HOSPITAL_COMMUNITY): Admission: RE | Admit: 2009-09-12 | Discharge: 2009-09-12 | Payer: Self-pay | Admitting: Internal Medicine

## 2009-09-12 ENCOUNTER — Encounter: Payer: Self-pay | Admitting: Pulmonary Disease

## 2009-09-17 ENCOUNTER — Encounter: Payer: Self-pay | Admitting: Pulmonary Disease

## 2009-09-17 LAB — COMPREHENSIVE METABOLIC PANEL
AST: 21 U/L (ref 0–37)
Alkaline Phosphatase: 66 U/L (ref 39–117)
BUN: 21 mg/dL (ref 6–23)
Creatinine, Ser: 0.93 mg/dL (ref 0.40–1.50)
Glucose, Bld: 154 mg/dL — ABNORMAL HIGH (ref 70–99)
Total Bilirubin: 0.3 mg/dL (ref 0.3–1.2)

## 2009-09-17 LAB — CBC WITH DIFFERENTIAL/PLATELET
BASO%: 0.2 % (ref 0.0–2.0)
EOS%: 0.2 % (ref 0.0–7.0)
HCT: 33.8 % — ABNORMAL LOW (ref 38.4–49.9)
LYMPH%: 12 % — ABNORMAL LOW (ref 14.0–49.0)
MCH: 34.1 pg — ABNORMAL HIGH (ref 27.2–33.4)
MCHC: 33.7 g/dL (ref 32.0–36.0)
NEUT%: 83.9 % — ABNORMAL HIGH (ref 39.0–75.0)
Platelets: 248 10*3/uL (ref 140–400)
RBC: 3.34 10*6/uL — ABNORMAL LOW (ref 4.20–5.82)
WBC: 6.2 10*3/uL (ref 4.0–10.3)
nRBC: 0 % (ref 0–0)

## 2009-10-06 ENCOUNTER — Ambulatory Visit: Payer: Self-pay | Admitting: Internal Medicine

## 2009-10-08 LAB — CBC WITH DIFFERENTIAL/PLATELET
Eosinophils Absolute: 0 10*3/uL (ref 0.0–0.5)
HGB: 11.2 g/dL — ABNORMAL LOW (ref 13.0–17.1)
MCV: 101.2 fL — ABNORMAL HIGH (ref 79.3–98.0)
MONO%: 10.3 % (ref 0.0–14.0)
NEUT#: 5.3 10*3/uL (ref 1.5–6.5)
RBC: 3.27 10*6/uL — ABNORMAL LOW (ref 4.20–5.82)
RDW: 16.1 % — ABNORMAL HIGH (ref 11.0–14.6)
WBC: 6.6 10*3/uL (ref 4.0–10.3)
lymph#: 0.6 10*3/uL — ABNORMAL LOW (ref 0.9–3.3)

## 2009-10-08 LAB — COMPREHENSIVE METABOLIC PANEL
AST: 27 U/L (ref 0–37)
Albumin: 4.5 g/dL (ref 3.5–5.2)
Alkaline Phosphatase: 61 U/L (ref 39–117)
Calcium: 9.9 mg/dL (ref 8.4–10.5)
Chloride: 104 mEq/L (ref 96–112)
Glucose, Bld: 120 mg/dL — ABNORMAL HIGH (ref 70–99)
Potassium: 3.5 mEq/L (ref 3.5–5.3)
Sodium: 140 mEq/L (ref 135–145)
Total Protein: 6.7 g/dL (ref 6.0–8.3)

## 2009-10-08 LAB — UA PROTEIN, DIPSTICK - CHCC: Protein, Urine: NEGATIVE mg/dL

## 2009-10-10 ENCOUNTER — Ambulatory Visit: Payer: Self-pay | Admitting: Pulmonary Disease

## 2009-10-10 DIAGNOSIS — R05 Cough: Secondary | ICD-10-CM

## 2009-10-10 DIAGNOSIS — J984 Other disorders of lung: Secondary | ICD-10-CM

## 2009-10-21 ENCOUNTER — Telehealth (INDEPENDENT_AMBULATORY_CARE_PROVIDER_SITE_OTHER): Payer: Self-pay | Admitting: *Deleted

## 2009-10-24 ENCOUNTER — Ambulatory Visit: Payer: Self-pay | Admitting: Cardiology

## 2009-10-27 ENCOUNTER — Encounter: Payer: Self-pay | Admitting: Pulmonary Disease

## 2009-10-27 ENCOUNTER — Ambulatory Visit (HOSPITAL_COMMUNITY): Admission: RE | Admit: 2009-10-27 | Discharge: 2009-10-27 | Payer: Self-pay | Admitting: Pulmonary Disease

## 2009-10-29 ENCOUNTER — Encounter: Payer: Self-pay | Admitting: Pulmonary Disease

## 2009-10-29 LAB — CBC WITH DIFFERENTIAL/PLATELET
BASO%: 0.2 % (ref 0.0–2.0)
Eosinophils Absolute: 0.1 10*3/uL (ref 0.0–0.5)
HCT: 33.8 % — ABNORMAL LOW (ref 38.4–49.9)
LYMPH%: 12.5 % — ABNORMAL LOW (ref 14.0–49.0)
MCHC: 34.6 g/dL (ref 32.0–36.0)
MONO#: 0.3 10*3/uL (ref 0.1–0.9)
NEUT#: 3.6 10*3/uL (ref 1.5–6.5)
Platelets: 202 10*3/uL (ref 140–400)
RBC: 3.39 10*6/uL — ABNORMAL LOW (ref 4.20–5.82)
WBC: 4.6 10*3/uL (ref 4.0–10.3)
lymph#: 0.6 10*3/uL — ABNORMAL LOW (ref 0.9–3.3)

## 2009-10-29 LAB — UA PROTEIN, DIPSTICK - CHCC: Protein, Urine: <30 mg/dL

## 2009-10-31 ENCOUNTER — Ambulatory Visit (HOSPITAL_COMMUNITY): Admission: RE | Admit: 2009-10-31 | Discharge: 2009-10-31 | Payer: Self-pay | Admitting: Internal Medicine

## 2009-10-31 ENCOUNTER — Ambulatory Visit: Payer: Self-pay | Admitting: Pulmonary Disease

## 2009-11-05 ENCOUNTER — Ambulatory Visit: Payer: Self-pay | Admitting: Internal Medicine

## 2009-11-05 ENCOUNTER — Encounter: Payer: Self-pay | Admitting: Pulmonary Disease

## 2009-11-05 LAB — CBC WITH DIFFERENTIAL/PLATELET
Basophils Absolute: 0.1 10*3/uL (ref 0.0–0.1)
Eosinophils Absolute: 0.8 10*3/uL — ABNORMAL HIGH (ref 0.0–0.5)
HCT: 33.5 % — ABNORMAL LOW (ref 38.4–49.9)
HGB: 11.8 g/dL — ABNORMAL LOW (ref 13.0–17.1)
MCH: 36 pg — ABNORMAL HIGH (ref 27.2–33.4)
MONO#: 0.8 10*3/uL (ref 0.1–0.9)
NEUT#: 3.4 10*3/uL (ref 1.5–6.5)
NEUT%: 53.3 % (ref 39.0–75.0)
RDW: 15.3 % — ABNORMAL HIGH (ref 11.0–14.6)
lymph#: 1.4 10*3/uL (ref 0.9–3.3)

## 2009-11-05 LAB — COMPREHENSIVE METABOLIC PANEL
AST: 22 U/L (ref 0–37)
Albumin: 4.6 g/dL (ref 3.5–5.2)
BUN: 15 mg/dL (ref 6–23)
CO2: 23 mEq/L (ref 19–32)
Calcium: 9.5 mg/dL (ref 8.4–10.5)
Chloride: 104 mEq/L (ref 96–112)
Creatinine, Ser: 1.15 mg/dL (ref 0.40–1.50)
Glucose, Bld: 121 mg/dL — ABNORMAL HIGH (ref 70–99)
Potassium: 3.8 mEq/L (ref 3.5–5.3)

## 2009-11-18 ENCOUNTER — Ambulatory Visit: Admission: RE | Admit: 2009-11-18 | Discharge: 2009-12-26 | Payer: Self-pay | Admitting: Radiation Oncology

## 2009-11-19 ENCOUNTER — Encounter: Payer: Self-pay | Admitting: Pulmonary Disease

## 2009-12-04 ENCOUNTER — Encounter: Payer: Self-pay | Admitting: Pulmonary Disease

## 2009-12-04 LAB — COMPREHENSIVE METABOLIC PANEL
ALT: 15 U/L (ref 0–53)
AST: 17 U/L (ref 0–37)
Albumin: 4.7 g/dL (ref 3.5–5.2)
Calcium: 9.9 mg/dL (ref 8.4–10.5)
Chloride: 101 mEq/L (ref 96–112)
Creatinine, Ser: 0.87 mg/dL (ref 0.40–1.50)
Potassium: 3.8 mEq/L (ref 3.5–5.3)

## 2009-12-04 LAB — CBC WITH DIFFERENTIAL/PLATELET
BASO%: 0.2 % (ref 0.0–2.0)
EOS%: 0 % (ref 0.0–7.0)
HCT: 39.1 % (ref 38.4–49.9)
MCH: 34.3 pg — ABNORMAL HIGH (ref 27.2–33.4)
MCHC: 34.5 g/dL (ref 32.0–36.0)
NEUT%: 87.1 % — ABNORMAL HIGH (ref 39.0–75.0)
lymph#: 0.9 10*3/uL (ref 0.9–3.3)

## 2009-12-10 ENCOUNTER — Telehealth: Payer: Self-pay | Admitting: Pulmonary Disease

## 2009-12-23 ENCOUNTER — Ambulatory Visit: Payer: Self-pay | Admitting: Internal Medicine

## 2009-12-25 ENCOUNTER — Encounter: Payer: Self-pay | Admitting: Pulmonary Disease

## 2009-12-25 LAB — COMPREHENSIVE METABOLIC PANEL
Alkaline Phosphatase: 60 U/L (ref 39–117)
BUN: 20 mg/dL (ref 6–23)
CO2: 26 mEq/L (ref 19–32)
Glucose, Bld: 123 mg/dL — ABNORMAL HIGH (ref 70–99)
Total Bilirubin: 0.5 mg/dL (ref 0.3–1.2)

## 2009-12-25 LAB — CBC WITH DIFFERENTIAL/PLATELET
Basophils Absolute: 0 10*3/uL (ref 0.0–0.1)
EOS%: 0 % (ref 0.0–7.0)
Eosinophils Absolute: 0 10*3/uL (ref 0.0–0.5)
HCT: 36.4 % — ABNORMAL LOW (ref 38.4–49.9)
HGB: 12.5 g/dL — ABNORMAL LOW (ref 13.0–17.1)
LYMPH%: 11.2 % — ABNORMAL LOW (ref 14.0–49.0)
MCH: 33.3 pg (ref 27.2–33.4)
MCV: 97.1 fL (ref 79.3–98.0)
MONO%: 13.5 % (ref 0.0–14.0)
NEUT%: 75.1 % — ABNORMAL HIGH (ref 39.0–75.0)
Platelets: 212 10*3/uL (ref 140–400)

## 2009-12-25 LAB — UA PROTEIN, DIPSTICK - CHCC: Protein, Urine: NEGATIVE mg/dL

## 2010-01-12 ENCOUNTER — Ambulatory Visit (HOSPITAL_COMMUNITY): Admission: RE | Admit: 2010-01-12 | Discharge: 2010-01-12 | Payer: Self-pay | Admitting: Internal Medicine

## 2010-01-15 ENCOUNTER — Encounter: Payer: Self-pay | Admitting: Pulmonary Disease

## 2010-01-15 LAB — CBC WITH DIFFERENTIAL/PLATELET
EOS%: 0 % (ref 0.0–7.0)
Eosinophils Absolute: 0 10*3/uL (ref 0.0–0.5)
LYMPH%: 6 % — ABNORMAL LOW (ref 14.0–49.0)
MCH: 34.3 pg — ABNORMAL HIGH (ref 27.2–33.4)
MCHC: 35.4 g/dL (ref 32.0–36.0)
MCV: 96.7 fL (ref 79.3–98.0)
MONO%: 6.4 % (ref 0.0–14.0)
NEUT#: 5.4 10*3/uL (ref 1.5–6.5)
Platelets: 241 10*3/uL (ref 140–400)
RBC: 3.94 10*6/uL — ABNORMAL LOW (ref 4.20–5.82)
nRBC: 0 % (ref 0–0)

## 2010-01-15 LAB — COMPREHENSIVE METABOLIC PANEL
AST: 23 U/L (ref 0–37)
BUN: 16 mg/dL (ref 6–23)
Calcium: 10.3 mg/dL (ref 8.4–10.5)
Chloride: 103 mEq/L (ref 96–112)
Creatinine, Ser: 0.89 mg/dL (ref 0.40–1.50)
Total Bilirubin: 0.6 mg/dL (ref 0.3–1.2)

## 2010-02-03 ENCOUNTER — Ambulatory Visit: Payer: Self-pay | Admitting: Internal Medicine

## 2010-02-05 ENCOUNTER — Encounter: Payer: Self-pay | Admitting: Pulmonary Disease

## 2010-02-05 LAB — COMPREHENSIVE METABOLIC PANEL
ALT: 32 U/L (ref 0–53)
AST: 24 U/L (ref 0–37)
CO2: 26 mEq/L (ref 19–32)
Calcium: 9.8 mg/dL (ref 8.4–10.5)
Chloride: 100 mEq/L (ref 96–112)
Potassium: 3.9 mEq/L (ref 3.5–5.3)
Sodium: 136 mEq/L (ref 135–145)
Total Protein: 6.8 g/dL (ref 6.0–8.3)

## 2010-02-05 LAB — CBC WITH DIFFERENTIAL/PLATELET
BASO%: 0.5 % (ref 0.0–2.0)
MCHC: 34.7 g/dL (ref 32.0–36.0)
MONO#: 0.3 10*3/uL (ref 0.1–0.9)
RBC: 3.8 10*6/uL — ABNORMAL LOW (ref 4.20–5.82)
RDW: 16 % — ABNORMAL HIGH (ref 11.0–14.6)
WBC: 6 10*3/uL (ref 4.0–10.3)
lymph#: 0.6 10*3/uL — ABNORMAL LOW (ref 0.9–3.3)
nRBC: 0 % (ref 0–0)

## 2010-02-05 LAB — UA PROTEIN, DIPSTICK - CHCC: Protein, Urine: NEGATIVE mg/dL

## 2010-02-26 ENCOUNTER — Encounter: Payer: Self-pay | Admitting: Pulmonary Disease

## 2010-02-26 ENCOUNTER — Ambulatory Visit: Payer: Self-pay | Admitting: Internal Medicine

## 2010-02-26 LAB — CBC WITH DIFFERENTIAL/PLATELET
BASO%: 0.1 % (ref 0.0–2.0)
Basophils Absolute: 0 10*3/uL (ref 0.0–0.1)
Eosinophils Absolute: 0 10*3/uL (ref 0.0–0.5)
HCT: 36.3 % — ABNORMAL LOW (ref 38.4–49.9)
HGB: 12.6 g/dL — ABNORMAL LOW (ref 13.0–17.1)
MCHC: 34.7 g/dL (ref 32.0–36.0)
MONO#: 0.8 10*3/uL (ref 0.1–0.9)
NEUT#: 6.1 10*3/uL (ref 1.5–6.5)
NEUT%: 77.5 % — ABNORMAL HIGH (ref 39.0–75.0)
WBC: 7.8 10*3/uL (ref 4.0–10.3)
lymph#: 1 10*3/uL (ref 0.9–3.3)

## 2010-03-13 ENCOUNTER — Ambulatory Visit (HOSPITAL_COMMUNITY)
Admission: RE | Admit: 2010-03-13 | Discharge: 2010-03-13 | Payer: Self-pay | Source: Home / Self Care | Attending: Internal Medicine | Admitting: Internal Medicine

## 2010-03-19 ENCOUNTER — Encounter: Payer: Self-pay | Admitting: Pulmonary Disease

## 2010-03-19 LAB — CBC WITH DIFFERENTIAL/PLATELET
EOS%: 0.2 % (ref 0.0–7.0)
Eosinophils Absolute: 0 10*3/uL (ref 0.0–0.5)
MCH: 34.9 pg — ABNORMAL HIGH (ref 27.2–33.4)
MCV: 99.2 fL — ABNORMAL HIGH (ref 79.3–98.0)
MONO%: 2.3 % (ref 0.0–14.0)
NEUT#: 4.7 10*3/uL (ref 1.5–6.5)
RBC: 3.7 10*6/uL — ABNORMAL LOW (ref 4.20–5.82)
RDW: 15.3 % — ABNORMAL HIGH (ref 11.0–14.6)
nRBC: 0 % (ref 0–0)

## 2010-03-19 LAB — COMPREHENSIVE METABOLIC PANEL
Albumin: 4.4 g/dL (ref 3.5–5.2)
Alkaline Phosphatase: 69 U/L (ref 39–117)
Chloride: 103 mEq/L (ref 96–112)
Glucose, Bld: 154 mg/dL — ABNORMAL HIGH (ref 70–99)
Potassium: 4.2 mEq/L (ref 3.5–5.3)
Sodium: 138 mEq/L (ref 135–145)
Total Protein: 6.6 g/dL (ref 6.0–8.3)

## 2010-04-07 ENCOUNTER — Ambulatory Visit (HOSPITAL_BASED_OUTPATIENT_CLINIC_OR_DEPARTMENT_OTHER): Payer: Medicare Other | Admitting: Internal Medicine

## 2010-04-09 LAB — COMPREHENSIVE METABOLIC PANEL
ALT: 35 U/L (ref 0–53)
AST: 27 U/L (ref 0–37)
Albumin: 4.8 g/dL (ref 3.5–5.2)
Alkaline Phosphatase: 71 U/L (ref 39–117)
BUN: 16 mg/dL (ref 6–23)
CO2: 24 mEq/L (ref 19–32)
Calcium: 9.6 mg/dL (ref 8.4–10.5)
Chloride: 101 mEq/L (ref 96–112)
Creatinine, Ser: 0.93 mg/dL (ref 0.40–1.50)
Glucose, Bld: 191 mg/dL — ABNORMAL HIGH (ref 70–99)
Potassium: 3.7 mEq/L (ref 3.5–5.3)
Sodium: 137 mEq/L (ref 135–145)
Total Bilirubin: 0.5 mg/dL (ref 0.3–1.2)
Total Protein: 6.8 g/dL (ref 6.0–8.3)

## 2010-04-09 LAB — CBC WITH DIFFERENTIAL/PLATELET
BASO%: 0.1 % (ref 0.0–2.0)
Basophils Absolute: 0 10*3/uL (ref 0.0–0.1)
EOS%: 0 % (ref 0.0–7.0)
Eosinophils Absolute: 0 10*3/uL (ref 0.0–0.5)
HCT: 37.6 % — ABNORMAL LOW (ref 38.4–49.9)
HGB: 13 g/dL (ref 13.0–17.1)
LYMPH%: 5.5 % — ABNORMAL LOW (ref 14.0–49.0)
MCH: 34.7 pg — ABNORMAL HIGH (ref 27.2–33.4)
MCHC: 34.6 g/dL (ref 32.0–36.0)
MCV: 100.3 fL — ABNORMAL HIGH (ref 79.3–98.0)
MONO#: 0.3 10*3/uL (ref 0.1–0.9)
MONO%: 3.1 % (ref 0.0–14.0)
NEUT#: 7.6 10*3/uL — ABNORMAL HIGH (ref 1.5–6.5)
NEUT%: 91.3 % — ABNORMAL HIGH (ref 39.0–75.0)
Platelets: 246 10*3/uL (ref 140–400)
RBC: 3.75 10*6/uL — ABNORMAL LOW (ref 4.20–5.82)
RDW: 15.5 % — ABNORMAL HIGH (ref 11.0–14.6)
WBC: 8.4 10*3/uL (ref 4.0–10.3)
lymph#: 0.5 10*3/uL — ABNORMAL LOW (ref 0.9–3.3)
nRBC: 0 % (ref 0–0)

## 2010-04-09 LAB — UA PROTEIN, DIPSTICK - CHCC: Protein, Urine: NEGATIVE mg/dL

## 2010-04-28 NOTE — Letter (Signed)
[facility]   Imported By: Sherian Rein 04/29/2009 09:33:59  _____________________________________________________________________  External Attachment:    Type:   Image     Comment:   External Document

## 2010-04-28 NOTE — Letter (Signed)
Summary: [facility]  [facility]   Imported By: Sherian Rein 01/19/2010 13:56:38  _____________________________________________________________________  External Attachment:    Type:   Image     Comment:   External Document

## 2010-04-28 NOTE — Letter (Signed)
[facility]   Imported By: Sherian Rein 08/11/2009 07:28:52  _____________________________________________________________________  External Attachment:    Type:   Image     Comment:   External Document

## 2010-04-28 NOTE — Letter (Signed)
[facility]   Imported By: Sherian Rein 06/05/2009 10:29:16  _____________________________________________________________________  External Attachment:    Type:   Image     Comment:   External Document

## 2010-04-28 NOTE — Progress Notes (Signed)
Summary: AFB finalized in error but will be corrected  Phone Note Other Incoming   Caller: Gasper Lloyd with [facility] Summary of Call: Lawson Fiscal with [facility] calling to inform Dr. Vassie Loll that AFB was finalized as no fungus in 4 wks by mistake.  This should actually be no AFB in 6 wks.  THis will be corrected and will also send correct report.  Will forward to RA so he is aware.   Initial call taken by: Gweneth Dimitri RN,  December 10, 2009 2:06 PM

## 2010-04-28 NOTE — Letter (Signed)
[facility]   Imported By: Lester [place] 04/15/2009 07:34:22  _____________________________________________________________________  External Attachment:    Type:   Image     Comment:   External Document

## 2010-04-28 NOTE — Progress Notes (Signed)
Summary: cough/ runny  Phone Note Call from Patient Call back at [phone]   Caller: Patient Call For: [name] Summary of Call: cough runny nose .dr Vassie Loll told pt if he wasn't better to call him Initial call taken by: Rickard Patience,  October 21, 2009 9:14 AM  Follow-up for Phone Call        called and spoke with pt.  pt recently saw RA on 10-10-2009.  Pt was told to call back if symptoms didn't improve or worsened.  Pt states there has been no change since last visit.  Pt states he still has a runny nose with clear drainage and a non-productive cough.  Pt states he is taking Zyrtec D, nasal spray ( azelastine) and promethazine with codeine cough syrup with no relief of symptoms.  Pt states RA mentioned possibly doing a sinus scan.  Please advise of recs.  Pt does have pending appt with RA on 10-31-2009.  Aundra Millet Reynolds LPN  October 21, 2009 9:22 AM     Additional Follow-up for Phone Call Additional follow up Details #1::        ok to proceed with limited ct sinuses Additional Follow-up by: Comer Locket. Vassie Loll MD,  October 21, 2009 10:39 AM    Additional Follow-up for Phone Call Additional follow up Details #2::    Called, spoke with pt. Pt informed RA would like to proceed with limited CT of sinuses.  Advised he would receive another call regarding CT appt date/time/location.  He verbalized understanding. Gweneth Dimitri RN  October 21, 2009 10:57 AM

## 2010-04-28 NOTE — Letter (Signed)
[facility]   Imported By: Sherian Rein 05/09/2009 14:56:01  _____________________________________________________________________  External Attachment:    Type:   Image     Comment:   External Document

## 2010-04-28 NOTE — Assessment & Plan Note (Signed)
Summary: follow up per dr mohamad/cb   Visit Type:  Follow-up Primary Provider/Referring Provider:  Dr. Lenise Arena  CC:  Pt c/o non-productive cough worse x 4 weeks after taking Avelox .  History of Present Illness: 63/M , ex smoker with metastatic adenocarcinoma lung. He developed pain in his Rt shoulder & suprascapular region in 2/10 . CXR and then CT scan showed apical RUL mass 4.0 x 2.6 cm in contact with Rt [place] artery with rt pratracheal & Rt hilar Lymphadenopathy.  PET scan shows mass abutting the sigmoid colon as well as enlarged portocaval & periaortic LN. Colonoscopy ( Dr Linda Hedges not show intraluminal lesion (looks extraluminal on PET).  Biopsy of porta hepatis LN >>adenoCA -MRI brain neg Underwent chemo-R T, last oct 2010.  Ct chest 09/12/09 for restaging showed new cavitary lesion in LUL 2.3 cm  & smaller cavitary lesion in the RUL measuring 9mm.  He c/o persistent cough & nasal drainage since jan 2011, mild relief with narcotic cough syrup but now requiring increasing doses. Claritin did not help. Zantac did not help. Completed 10 ds of avelox. denies fevers, productive cough     Preventive Screening-Counseling & Management  Alcohol-Tobacco     Smoking Status: quit     Packs/Day: 2.0     Year Started: 1965     Year Quit: 1987  Current Medications (verified): 1)  Aspirin 81 Mg  Tabs (Aspirin) .... Take 1 Tablet By Mouth Once A Day(On Hold As of 01-03-2009) 2)  Multivitamins  Tabs (Multiple Vitamin) .... Once Daily 3)  Folivane-Plus  Caps (Fefum-Fepoly-Fa-B Cmp-C-Biot) .... Take 1 Tablet By Mouth Once A Day 4)  Folic Acid 1 Mg Tabs (Folic Acid) .... Take 1 Tablet By Mouth Once A Day 5)  Metoprolol Tartrate 25 Mg Tabs (Metoprolol Tartrate) .... Take 1 Tablet By Mouth Once A Day 6)  Lamisil .... Take 1 Tablet By Mouth Once A Day 7)  Senokot S 8.6-50 Mg Tabs (Sennosides-Docusate Sodium) .... As Directed 8)  Ibuprofen 200 Mg Tabs (Ibuprofen) .... As Needed 9)  Zantac 150 Mg Tabs  (Ranitidine Hcl) .... Take 1 Tablet By Mouth Once A Day  Allergies (verified): 1)  Percocet 2)  * Ambien  Past History:  Social History: Last updated: 12/11/2008 3 children alcohol-5drinks/day occupation-electrician, Gwyndolyn Kaufman Patient states former smoker.  Married  Past Medical History: ARTHRITIS (ICD-716.90) Lung Cancer  Review of Systems       The patient complains of prolonged cough.  The patient denies anorexia, fever, weight loss, weight gain, vision loss, decreased hearing, hoarseness, chest pain, syncope, dyspnea on exertion, peripheral edema, headaches, hemoptysis, abdominal pain, melena, hematochezia, severe indigestion/heartburn, hematuria, muscle weakness, suspicious skin lesions, difficulty walking, depression, unusual weight change, and abnormal bleeding.    Vital Signs:  Patient profile:   65 year old male Height:      66 inches Weight:      138.6 pounds BMI:     22.45 O2 Sat:      98 % on Room air Temp:     98.3 degrees F oral Pulse rate:   62 / minute BP sitting:   110 / 72  (left arm) Cuff size:   regular  Vitals Entered By: Zackery Barefoot CMA (October 10, 2009 10:25 AM)  O2 Flow:  Room air CC: Pt c/o non-productive cough worse x 4 weeks after taking Avelox  Comments Medications reviewed with patient Verified contact number and pharmacy with patient Zackery Barefoot [facility]  October 10, 2009 10:25  AM    Physical Exam  Additional Exam:  Gen. Pleasant, well-nourished, in no distress, normal affect ENT - no lesions, no post nasal drip Neck: No JVD, no thyromegaly, no carotid bruits Lungs: no use of accessory muscles, no dullness to percussion, clear without rales or rhonchi  Cardiovascular: Rhythm regular, heart sounds  normal, no murmurs or gallops, no peripheral edema Musculoskeletal: No deformities, no cyanosis , clubbing + no supraclavicular LNs     Impression & Recommendations:  Problem # 1:  COUGH (ICD-786.2)  Will treat for usual suspects  including post nasal drip & GERD Take zyrtec-D x 2 weeks Nasal spray sample (azelastine) each nare at bedtime  Stay on cough syrup 5 ml by mouth two times a day as needed  Call if no better in 2 weeks - & if so, proceed with CT sinuses  Orders: Prescription Created Electronically [phone]) Est. 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  Medications Added to Medication List This Visit: 1)  Folivane-plus Caps (Fefum-fepoly-fa-b cmp-c-biot) .... Take 1 tablet by mouth once a day 2)  Folic Acid 1 Mg Tabs (Folic acid) .... Take 1 tablet by mouth once a day 3)  Metoprolol Tartrate 25 Mg Tabs (Metoprolol tartrate) .... Take 1 tablet by mouth once a day 4)  Lamisil  .... Take 1 tablet by mouth once a day 5)  Senokot S 8.6-50 Mg Tabs (Sennosides-docusate sodium) .... As directed 6)  Ibuprofen 200 Mg Tabs (Ibuprofen) .... As needed 7)  Zantac 150 Mg Tabs (Ranitidine hcl) .... Take 1 tablet by mouth once a day 8)  Omeprazole 40 Mg Cpdr (Omeprazole) .... Once daily  Other Orders: Radiology Referral (Radiology)  Patient Instructions: 1)  Copy sent to:Dr Mohammed 2)  Please schedule a follow-up appointment in 3 weeks. 3)  Take zyrtec-D x 2 weeks 4)  Nasal spray sample (azelastine) each nare at bedtime  5)  Stay on cough syrup 5 ml by mouth two times a day as needed  6)  Call if no better in 2 weeks 7)  Very likely, we will proceed with biopsy of lt nodule - after I discuss with Dr Shirline Frees Prescriptions: OMEPRAZOLE 40 MG CPDR (OMEPRAZOLE) once daily  #30 x 1   Entered and Authorized by:   Comer Locket Vassie Loll MD   Signed by:   Comer Locket Vassie Loll MD on 10/10/2009   Method used:   Electronically to        [facility]  [place] [address] (retail)       [address]       [place], [place]  [address]       Ph: [phone] or  [phone]       Fax: [phone]   RxID:   [id number]     Appended Document: follow up per dr mohamad/cb pl let him know - I have d/w Dr Shirline Frees & OK to proceed with biopsy

## 2010-04-28 NOTE — Letter (Signed)
[facility]   Imported By: Sherian Rein 10/24/2009 10:36:17  _____________________________________________________________________  External Attachment:    Type:   Image     Comment:   External Document

## 2010-04-28 NOTE — Letter (Signed)
[facility]   Imported By: Sherian Rein 10/07/2009 09:39:44  _____________________________________________________________________  External Attachment:    Type:   Image     Comment:   External Document

## 2010-04-28 NOTE — Letter (Signed)
Summary: [facility]  [facility]   Imported By: Sherian Rein 12/15/2009 08:40:47  _____________________________________________________________________  External Attachment:    Type:   Image     Comment:   External Document

## 2010-04-28 NOTE — Letter (Signed)
[facility]   Imported By: Sherian Rein 04/29/2009 09:33:00  _____________________________________________________________________  External Attachment:    Type:   Image     Comment:   External Document

## 2010-04-28 NOTE — Letter (Signed)
[facility]   Imported By: Sherian Rein 12/02/2009 09:03:55  _____________________________________________________________________  External Attachment:    Type:   Image     Comment:   External Document

## 2010-04-28 NOTE — Letter (Signed)
Summary: [facility]  [facility]   Imported By: Sherian Rein 02/16/2010 12:57:18  _____________________________________________________________________  External Attachment:    Type:   Image     Comment:   External Document

## 2010-04-28 NOTE — Letter (Signed)
Summary: [facility]  [facility]   Imported By: Sherian Rein 12/22/2009 08:29:48  _____________________________________________________________________  External Attachment:    Type:   Image     Comment:   External Document

## 2010-04-28 NOTE — Assessment & Plan Note (Signed)
Summary: 3 weeks/apc   Copy to:  [facility] Primary Provider/Referring Provider:  Dr. Lenise Arena  CC:  Pt is here for a f/u appt to discuss CT and biopsy results.  Pt denied any changes to breathing.   Pt states occ non-productive cough and clear nasal drainage.  Marland Kitchen  History of Present Illness: 63/M , ex smoker with metastatic adenocarcinoma lung. He developed pain in his Rt shoulder & suprascapular region in 2/10 . CXR and then CT scan showed apical RUL mass 4.0 x 2.6 cm in contact with Rt [place] artery with rt pratracheal & Rt hilar Lymphadenopathy.  PET scan shows mass abutting the sigmoid colon as well as enlarged portocaval & periaortic LN. Colonoscopy ( Dr Linda Hedges not show intraluminal lesion (looks extraluminal on PET).  Biopsy of porta hepatis LN >>adenoCA -MRI brain neg Underwent chemo-R T, last oct 2010.  Ct chest 09/12/09 for restaging showed new cavitary lesion in LUL 2.3 cm  & smaller cavitary lesion in the RUL measuring 9mm.  He c/o persistent cough & nasal drainage since jan 2011, mild relief with narcotic cough syrup but now requiring increasing doses. Claritin did not help. Zantac did not help. Completed 10 ds of avelox.  October 31, 2009 10:34 AM  Discussed bx results of LUL lesion - -> adenoCA denies fevers, c/o dry cough relieved for a few hours by hydrocodone syrup     Current Medications (verified): 1)  Aspirin 81 Mg  Tabs (Aspirin) .... Take 1 Tablet By Mouth Once A Day(On Hold As of 01-03-2009) 2)  Multivitamins  Tabs (Multiple Vitamin) .... Once Daily 3)  Folivane-Plus  Caps (Fefum-Fepoly-Fa-B Cmp-C-Biot) .... Take 1 Tablet By Mouth Once A Day 4)  Folic Acid 1 Mg Tabs (Folic Acid) .... Take 1 Tablet By Mouth Once A Day 5)  Metoprolol Tartrate 25 Mg Tabs (Metoprolol Tartrate) .... Take 1 Tablet By Mouth Once A Day 6)  Terbinafine Hcl 250 Mg Tabs (Terbinafine Hcl) .... Take 1 Tablet By Mouth Once A Day 7)  Senokot S 8.6-50 Mg Tabs (Sennosides-Docusate Sodium) ....  As Directed 8)  Ibuprofen 200 Mg Tabs (Ibuprofen) .... As Needed 9)  Omeprazole 40 Mg Cpdr (Omeprazole) .... Once Daily 10)  Tamsulosin Hcl 0.4 Mg Caps (Tamsulosin Hcl) .... Take 1 Tablet By Mouth Once A Day 11)  Dexamethasone 4 Mg Tabs (Dexamethasone) .... Take 2 Tabs By Mouth Daily 1 Day Before Chemo, Day of Chemo, and 1 Day After Chemo 12)  Prochlorperazine Maleate 10 Mg Tabs (Prochlorperazine Maleate) .... Take By Mouth As Needed For Nausea 13)  Hydrocodone-Homatropine 5-1.5 Mg/36ml Syrp (Hydrocodone-Homatropine) .... Take As Needed For Cough 14)  Astelin 137 Mcg/spray Soln (Azelastine Hcl) .... 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  Married  Risk Factors: Smoking Status: quit (10/10/2009) Packs/Day: 2.0 (10/10/2009)  Review of Systems       The patient complains of prolonged cough.  The patient denies anorexia, fever, weight loss, weight gain, vision loss, decreased hearing, hoarseness, chest pain, syncope, dyspnea on exertion, peripheral edema, headaches, hemoptysis, abdominal pain, melena, hematochezia, severe indigestion/heartburn, hematuria, muscle weakness, transient blindness, difficulty walking, depression, unusual weight change, and abnormal bleeding.    Vital Signs:  Patient profile:   65 year old male Height:  66 inches Weight:      137.25 pounds BMI:     22.23 O2 Sat:      100 % on Room air Temp:     97.8 degrees F oral Pulse rate:   58 / minute BP sitting:   106 / 68  (left arm) Cuff size:   regular  Vitals Entered By: Arman Filter LPN (October 31, 2009 10:03 AM)  O2 Flow:  Room air CC: Pt is here for a f/u appt to discuss CT and biopsy results.  Pt denied any changes  to breathing.   Pt states occ non-productive cough and clear nasal drainage.   Comments Medications reviewed with patient Arman Filter LPN  October 31, 2009 10:03 AM    Physical Exam  Additional Exam:  Gen. 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 Further chemoRx is planned by dr Shirline Frees based on restaging scan He will keep FU with [place] in 6 months  should further pulmonary diagnostic issues arise. Use AFRIN as needed for nasal congestion Take Omeprazole/ Zantac   Medications Added to Medication List This Visit: 1)  Terbinafine Hcl 250 Mg Tabs (Terbinafine hcl) .... Take 1 tablet by mouth once a day 2)  Tamsulosin Hcl 0.4 Mg Caps (Tamsulosin hcl) .... Take 1 tablet by mouth once a day 3)  Dexamethasone 4 Mg Tabs (Dexamethasone) .... Take 2 tabs by mouth daily 1 day before chemo, day of chemo, and 1 day after chemo 4)  Prochlorperazine Maleate 10 Mg Tabs (Prochlorperazine maleate) .... Take by mouth as needed for nausea 5)  Hydrocodone-homatropine 5-1.5 Mg/26ml Syrp (Hydrocodone-homatropine) .... Take as needed for cough 6)  Astelin 137 Mcg/spray Soln (Azelastine hcl) .... 2 sprays in each nostril once daily  Other Orders: Est. Patient Level III (52841)  Patient Instructions: 1)  Copy sent to: Dr Shirline Frees 2)  Please schedule a follow-up appointment in 6 months. 3)  Use AFRIN as needed for nasal congestion 4)  Take  Omeprazole/ Zantac

## 2010-04-28 NOTE — Letter (Signed)
Summary: [facility]  [facility]   Imported By: Sherian Rein 02/09/2010 13:20:21  _____________________________________________________________________  External Attachment:    Type:   Image     Comment:   External Document

## 2010-04-28 NOTE — Letter (Signed)
[facility]   Imported By: Sherian Rein 12/02/2009 09:04:51  _____________________________________________________________________  External Attachment:    Type:   Image     Comment:   External Document

## 2010-04-28 NOTE — Letter (Signed)
[facility]   Imported By: Lester [place] 04/15/2009 07:31:10  _____________________________________________________________________  External Attachment:    Type:   Image     Comment:   External Document

## 2010-04-30 ENCOUNTER — Ambulatory Visit (HOSPITAL_BASED_OUTPATIENT_CLINIC_OR_DEPARTMENT_OTHER): Payer: Medicare Other | Admitting: Internal Medicine

## 2010-04-30 DIAGNOSIS — Z5112 Encounter for antineoplastic immunotherapy: Secondary | ICD-10-CM

## 2010-04-30 DIAGNOSIS — C3410 Malignant neoplasm of upper lobe, unspecified bronchus or lung: Secondary | ICD-10-CM

## 2010-04-30 DIAGNOSIS — Z5111 Encounter for antineoplastic chemotherapy: Secondary | ICD-10-CM

## 2010-04-30 LAB — CBC WITH DIFFERENTIAL/PLATELET
Basophils Absolute: 0 10*3/uL (ref 0.0–0.1)
HCT: 36.7 % — ABNORMAL LOW (ref 38.4–49.9)
HGB: 12.7 g/dL — ABNORMAL LOW (ref 13.0–17.1)
MCH: 34.6 pg — ABNORMAL HIGH (ref 27.2–33.4)
MONO#: 0.5 10*3/uL (ref 0.1–0.9)
NEUT%: 75.1 % — ABNORMAL HIGH (ref 39.0–75.0)
WBC: 5.1 10*3/uL (ref 4.0–10.3)
lymph#: 0.7 10*3/uL — ABNORMAL LOW (ref 0.9–3.3)

## 2010-04-30 LAB — COMPREHENSIVE METABOLIC PANEL
Albumin: 4.5 g/dL (ref 3.5–5.2)
BUN: 16 mg/dL (ref 6–23)
Calcium: 9.7 mg/dL (ref 8.4–10.5)
Chloride: 101 mEq/L (ref 96–112)
Glucose, Bld: 133 mg/dL — ABNORMAL HIGH (ref 70–99)
Potassium: 3.9 mEq/L (ref 3.5–5.3)

## 2010-04-30 NOTE — Letter (Signed)
Summary: [facility]  [facility]   Imported By: Lester [place] 04/01/2010 10:19:57  _____________________________________________________________________  External Attachment:    Type:   Image     Comment:   External Document

## 2010-04-30 NOTE — Letter (Signed)
Summary: [facility]  [facility]   Imported By: Sherian Rein 03/13/2010 10:42:09  _____________________________________________________________________  External Attachment:    Type:   Image     Comment:   External Document

## 2010-05-21 ENCOUNTER — Encounter: Payer: Self-pay | Admitting: Pulmonary Disease

## 2010-05-21 ENCOUNTER — Encounter (HOSPITAL_BASED_OUTPATIENT_CLINIC_OR_DEPARTMENT_OTHER): Payer: Medicare Other | Admitting: Internal Medicine

## 2010-05-21 ENCOUNTER — Other Ambulatory Visit: Payer: Self-pay | Admitting: Internal Medicine

## 2010-05-21 DIAGNOSIS — Z5111 Encounter for antineoplastic chemotherapy: Secondary | ICD-10-CM

## 2010-05-21 DIAGNOSIS — Z1389 Encounter for screening for other disorder: Secondary | ICD-10-CM

## 2010-05-21 DIAGNOSIS — C3490 Malignant neoplasm of unspecified part of unspecified bronchus or lung: Secondary | ICD-10-CM

## 2010-05-21 DIAGNOSIS — C3410 Malignant neoplasm of upper lobe, unspecified bronchus or lung: Secondary | ICD-10-CM

## 2010-05-21 DIAGNOSIS — Z5112 Encounter for antineoplastic immunotherapy: Secondary | ICD-10-CM

## 2010-05-21 LAB — COMPREHENSIVE METABOLIC PANEL
AST: 30 U/L (ref 0–37)
Alkaline Phosphatase: 65 U/L (ref 39–117)
BUN: 16 mg/dL (ref 6–23)
Glucose, Bld: 102 mg/dL — ABNORMAL HIGH (ref 70–99)
Potassium: 3.8 mEq/L (ref 3.5–5.3)
Total Bilirubin: 0.7 mg/dL (ref 0.3–1.2)

## 2010-05-21 LAB — CBC WITH DIFFERENTIAL/PLATELET
BASO%: 1.2 % (ref 0.0–2.0)
Basophils Absolute: 0.1 10*3/uL (ref 0.0–0.1)
EOS%: 4.5 % (ref 0.0–7.0)
HGB: 13.1 g/dL (ref 13.0–17.1)
MCH: 34.6 pg — ABNORMAL HIGH (ref 27.2–33.4)
MCHC: 34.6 g/dL (ref 32.0–36.0)
MCV: 100 fL — ABNORMAL HIGH (ref 79.3–98.0)
MONO%: 17.5 % — ABNORMAL HIGH (ref 0.0–14.0)
NEUT%: 60.7 % (ref 39.0–75.0)
RDW: 15.6 % — ABNORMAL HIGH (ref 11.0–14.6)
lymph#: 1 10*3/uL (ref 0.9–3.3)

## 2010-05-21 LAB — UA PROTEIN, DIPSTICK - CHCC: Protein, Urine: NEGATIVE mg/dL

## 2010-06-04 ENCOUNTER — Other Ambulatory Visit: Payer: Self-pay | Admitting: Internal Medicine

## 2010-06-04 ENCOUNTER — Encounter (HOSPITAL_COMMUNITY): Payer: Self-pay

## 2010-06-04 ENCOUNTER — Ambulatory Visit (HOSPITAL_COMMUNITY)
Admission: RE | Admit: 2010-06-04 | Discharge: 2010-06-04 | Disposition: A | Discharge status: Home / Self Care | Payer: Medicare Other | Source: Ambulatory Visit | Attending: Internal Medicine | Admitting: Internal Medicine

## 2010-06-04 DIAGNOSIS — C3490 Malignant neoplasm of unspecified part of unspecified bronchus or lung: Secondary | ICD-10-CM

## 2010-06-04 DIAGNOSIS — I658 Occlusion and stenosis of other precerebral arteries: Secondary | ICD-10-CM | POA: Insufficient documentation

## 2010-06-04 DIAGNOSIS — Z923 Personal history of irradiation: Secondary | ICD-10-CM | POA: Insufficient documentation

## 2010-06-04 MED ORDER — IOHEXOL 300 MG/ML  SOLN
100.0000 mL | Freq: Once | INTRAMUSCULAR | Status: AC | PRN
  Administered 2010-06-04: 100 mL via INTRAVENOUS

## 2010-06-11 ENCOUNTER — Other Ambulatory Visit: Payer: Self-pay | Admitting: Internal Medicine

## 2010-06-11 ENCOUNTER — Encounter (HOSPITAL_BASED_OUTPATIENT_CLINIC_OR_DEPARTMENT_OTHER): Payer: Medicare Other | Admitting: Internal Medicine

## 2010-06-11 DIAGNOSIS — C3410 Malignant neoplasm of upper lobe, unspecified bronchus or lung: Secondary | ICD-10-CM

## 2010-06-11 DIAGNOSIS — C801 Malignant (primary) neoplasm, unspecified: Secondary | ICD-10-CM

## 2010-06-11 DIAGNOSIS — Z5111 Encounter for antineoplastic chemotherapy: Secondary | ICD-10-CM

## 2010-06-11 DIAGNOSIS — Z5112 Encounter for antineoplastic immunotherapy: Secondary | ICD-10-CM

## 2010-06-11 LAB — CBC WITH DIFFERENTIAL/PLATELET
Basophils Absolute: 0 10*3/uL (ref 0.0–0.1)
Eosinophils Absolute: 0 10*3/uL (ref 0.0–0.5)
HGB: 12.5 g/dL — ABNORMAL LOW (ref 13.0–17.1)
LYMPH%: 8.9 % — ABNORMAL LOW (ref 14.0–49.0)
MCV: 103.3 fL — ABNORMAL HIGH (ref 79.3–98.0)
MONO#: 0.2 10*3/uL (ref 0.1–0.9)
NEUT#: 5.5 10*3/uL (ref 1.5–6.5)
Platelets: 247 10*3/uL (ref 140–400)
RBC: 3.5 10*6/uL — ABNORMAL LOW (ref 4.20–5.82)
RDW: 16.5 % — ABNORMAL HIGH (ref 11.0–14.6)
WBC: 6.3 10*3/uL (ref 4.0–10.3)

## 2010-06-11 LAB — COMPREHENSIVE METABOLIC PANEL
Albumin: 4.7 g/dL (ref 3.5–5.2)
BUN: 19 mg/dL (ref 6–23)
CO2: 22 mEq/L (ref 19–32)
Glucose, Bld: 140 mg/dL — ABNORMAL HIGH (ref 70–99)
Potassium: 3.7 mEq/L (ref 3.5–5.3)
Sodium: 135 mEq/L (ref 135–145)
Total Protein: 6.6 g/dL (ref 6.0–8.3)

## 2010-06-12 LAB — AFB CULTURE WITH SMEAR (NOT AT ARMC)

## 2010-06-12 LAB — APTT: aPTT: 25 seconds (ref 24–37)

## 2010-06-12 LAB — BODY FLUID CULTURE: Gram Stain: NONE SEEN

## 2010-06-12 LAB — PROTIME-INR
INR: 1.05 (ref 0.00–1.49)
Prothrombin Time: 13.6 seconds (ref 11.6–15.2)

## 2010-06-12 LAB — CBC
Hemoglobin: 13.4 g/dL (ref 13.0–17.0)
Platelets: 205 10*3/uL (ref 150–400)
RBC: 3.72 MIL/uL — ABNORMAL LOW (ref 4.22–5.81)
WBC: 4.9 10*3/uL (ref 4.0–10.5)

## 2010-06-12 LAB — FUNGUS CULTURE W SMEAR: Fungal Smear: NONE SEEN

## 2010-06-14 LAB — COMPREHENSIVE METABOLIC PANEL
AST: 42 U/L — ABNORMAL HIGH (ref 0–37)
CO2: 23 mEq/L (ref 19–32)
Calcium: 9.2 mg/dL (ref 8.4–10.5)
Creatinine, Ser: 0.97 mg/dL (ref 0.4–1.5)
GFR calc Af Amer: >60 mL/min (ref >60)
GFR calc non Af Amer: >60 mL/min (ref >60)
Sodium: 139 mEq/L (ref 135–145)
Total Protein: 6.2 g/dL (ref 6.0–8.3)

## 2010-06-14 LAB — BASIC METABOLIC PANEL
BUN: 15 mg/dL (ref 6–23)
CO2: 25 mEq/L (ref 19–32)
Chloride: 105 mEq/L (ref 96–112)
Creatinine, Ser: 1 mg/dL (ref 0.4–1.5)
GFR calc Af Amer: >60 mL/min (ref >60)
Glucose, Bld: 109 mg/dL — ABNORMAL HIGH (ref 70–99)

## 2010-06-14 LAB — DIFFERENTIAL
Basophils Absolute: 0 10*3/uL (ref 0.0–0.1)
Basophils Relative: 0 % (ref 0–1)
Basophils Relative: 0 % (ref 0–1)
Eosinophils Absolute: 0.1 10*3/uL (ref 0.0–0.7)
Eosinophils Absolute: 0.1 10*3/uL (ref 0.0–0.7)
Eosinophils Relative: 4 % (ref 0–5)
Eosinophils Relative: 4 % (ref 0–5)
Lymphs Abs: 0.6 10*3/uL — ABNORMAL LOW (ref 0.7–4.0)
Monocytes Absolute: 0.5 10*3/uL (ref 0.1–1.0)
Monocytes Absolute: 0.6 10*3/uL (ref 0.1–1.0)
Monocytes Relative: 19 % — ABNORMAL HIGH (ref 3–12)
Neutrophils Relative %: 49 % (ref 43–77)

## 2010-06-14 LAB — CBC
Hemoglobin: 8.7 g/dL — ABNORMAL LOW (ref 13.0–17.0)
MCHC: 34.6 g/dL (ref 30.0–36.0)
MCHC: 34.8 g/dL (ref 30.0–36.0)
MCHC: 34.9 g/dL (ref 30.0–36.0)
MCHC: 35.2 g/dL (ref 30.0–36.0)
MCV: 99.5 fL (ref 78.0–100.0)
MCV: 99.6 fL (ref 78.0–100.0)
MCV: 99.7 fL (ref 78.0–100.0)
Platelets: 63 10*3/uL — ABNORMAL LOW (ref 150–400)
RBC: 2.49 MIL/uL — ABNORMAL LOW (ref 4.22–5.81)
RBC: 2.63 MIL/uL — ABNORMAL LOW (ref 4.22–5.81)
RBC: 2.69 MIL/uL — ABNORMAL LOW (ref 4.22–5.81)
RBC: 2.87 MIL/uL — ABNORMAL LOW (ref 4.22–5.81)
RDW: 19.4 % — ABNORMAL HIGH (ref 11.5–15.5)

## 2010-06-14 LAB — URINALYSIS, ROUTINE W REFLEX MICROSCOPIC
Bilirubin Urine: NEGATIVE
Nitrite: NEGATIVE
Specific Gravity, Urine: 1.013 (ref 1.005–1.030)
Urobilinogen, UA: 0.2 mg/dL (ref 0.0–1.0)
pH: 6 (ref 5.0–8.0)

## 2010-06-14 LAB — POCT CARDIAC MARKERS
CKMB, poc: <1 ng/mL — ABNORMAL LOW (ref 1.0–8.0)
Troponin i, poc: <0.05 ng/mL (ref 0.00–0.09)

## 2010-06-14 LAB — CK TOTAL AND CKMB (NOT AT ARMC)
CK, MB: 0.6 ng/mL (ref 0.3–4.0)
Relative Index: INVALID (ref 0.0–2.5)
Total CK: 40 U/L (ref 7–232)

## 2010-06-14 LAB — URINE CULTURE: Colony Count: NO GROWTH

## 2010-06-14 LAB — D-DIMER, QUANTITATIVE: D-Dimer, Quant: 0.41 ug/mL-FEU (ref 0.00–0.48)

## 2010-06-16 NOTE — Letter (Signed)
Summary: [facility]  [facility]   Imported By: Lennie Odor 06/11/2010 12:11:47  _____________________________________________________________________  External Attachment:    Type:   Image     Comment:   External Document

## 2010-06-30 LAB — DIFFERENTIAL
Basophils Absolute: 0 10*3/uL (ref 0.0–0.1)
Eosinophils Absolute: 0.4 10*3/uL (ref 0.0–0.7)
Lymphocytes Relative: 21 % (ref 12–46)
Neutrophils Relative %: 49 % (ref 43–77)

## 2010-06-30 LAB — CBC
MCHC: 33.8 g/dL (ref 30.0–36.0)
Platelets: 185 10*3/uL (ref 150–400)
RBC: 3.11 MIL/uL — ABNORMAL LOW (ref 4.22–5.81)
RDW: 23 % — ABNORMAL HIGH (ref 11.5–15.5)

## 2010-06-30 LAB — PROTIME-INR
INR: 1.08 (ref 0.00–1.49)
Prothrombin Time: 13.9 seconds (ref 11.6–15.2)

## 2010-07-02 ENCOUNTER — Other Ambulatory Visit: Payer: Self-pay | Admitting: Internal Medicine

## 2010-07-02 ENCOUNTER — Encounter (HOSPITAL_BASED_OUTPATIENT_CLINIC_OR_DEPARTMENT_OTHER): Payer: Medicare Other | Admitting: Internal Medicine

## 2010-07-02 DIAGNOSIS — C3410 Malignant neoplasm of upper lobe, unspecified bronchus or lung: Secondary | ICD-10-CM

## 2010-07-02 DIAGNOSIS — Z5112 Encounter for antineoplastic immunotherapy: Secondary | ICD-10-CM

## 2010-07-02 DIAGNOSIS — Z5111 Encounter for antineoplastic chemotherapy: Secondary | ICD-10-CM

## 2010-07-02 DIAGNOSIS — Z1389 Encounter for screening for other disorder: Secondary | ICD-10-CM

## 2010-07-02 LAB — CBC
HCT: 34.7 % — ABNORMAL LOW (ref 39.0–52.0)
Hemoglobin: 11.8 g/dL — ABNORMAL LOW (ref 13.0–17.0)
MCV: 89.9 fL (ref 78.0–100.0)
Platelets: 305 10*3/uL (ref 150–400)
WBC: 8.2 10*3/uL (ref 4.0–10.5)

## 2010-07-02 LAB — APTT: aPTT: 35 seconds (ref 24–37)

## 2010-07-02 LAB — COMPREHENSIVE METABOLIC PANEL
ALT: 39 U/L (ref 0–53)
BUN: 18 mg/dL (ref 6–23)
CO2: 23 mEq/L (ref 19–32)
Calcium: 9.8 mg/dL (ref 8.4–10.5)
Creatinine, Ser: 1.24 mg/dL (ref 0.40–1.50)
Total Bilirubin: 0.5 mg/dL (ref 0.3–1.2)

## 2010-07-02 LAB — CBC WITH DIFFERENTIAL/PLATELET
Basophils Absolute: 0 10*3/uL (ref 0.0–0.1)
EOS%: 2.4 % (ref 0.0–7.0)
LYMPH%: 13.1 % — ABNORMAL LOW (ref 14.0–49.0)
MCH: 33.9 pg — ABNORMAL HIGH (ref 27.2–33.4)
MCV: 101.3 fL — ABNORMAL HIGH (ref 79.3–98.0)
MONO%: 4.8 % (ref 0.0–14.0)
Platelets: 217 10*3/uL (ref 140–400)
RBC: 3.83 10*6/uL — ABNORMAL LOW (ref 4.20–5.82)
RDW: 15 % — ABNORMAL HIGH (ref 11.0–14.6)
nRBC: 0 % (ref 0–0)

## 2010-07-02 LAB — UA PROTEIN, DIPSTICK - CHCC: Protein, Urine: NEGATIVE mg/dL

## 2010-07-03 LAB — BASIC METABOLIC PANEL
BUN: 9 mg/dL (ref 6–23)
Chloride: 105 mEq/L (ref 96–112)
GFR calc Af Amer: >60 mL/min (ref >60)
GFR calc non Af Amer: >60 mL/min (ref >60)
Potassium: 3.9 mEq/L (ref 3.5–5.1)
Sodium: 140 mEq/L (ref 135–145)

## 2010-07-03 LAB — CBC
HCT: 36.5 % — ABNORMAL LOW (ref 39.0–52.0)
Hemoglobin: 12.2 g/dL — ABNORMAL LOW (ref 13.0–17.0)
MCV: 90.4 fL (ref 78.0–100.0)
Platelets: 351 10*3/uL (ref 150–400)
RBC: 4.04 MIL/uL — ABNORMAL LOW (ref 4.22–5.81)
WBC: 8.1 10*3/uL (ref 4.0–10.5)

## 2010-07-03 LAB — APTT: aPTT: 34 seconds (ref 24–37)

## 2010-07-03 LAB — GLUCOSE, CAPILLARY: Glucose-Capillary: 107 mg/dL — ABNORMAL HIGH (ref 70–99)

## 2010-07-23 ENCOUNTER — Other Ambulatory Visit: Payer: Self-pay | Admitting: Internal Medicine

## 2010-07-23 ENCOUNTER — Encounter (HOSPITAL_BASED_OUTPATIENT_CLINIC_OR_DEPARTMENT_OTHER): Payer: Medicare Other | Admitting: Internal Medicine

## 2010-07-23 DIAGNOSIS — C3410 Malignant neoplasm of upper lobe, unspecified bronchus or lung: Secondary | ICD-10-CM

## 2010-07-23 DIAGNOSIS — Z5112 Encounter for antineoplastic immunotherapy: Secondary | ICD-10-CM

## 2010-07-23 DIAGNOSIS — Z1389 Encounter for screening for other disorder: Secondary | ICD-10-CM

## 2010-07-23 DIAGNOSIS — C3490 Malignant neoplasm of unspecified part of unspecified bronchus or lung: Secondary | ICD-10-CM

## 2010-07-23 LAB — COMPREHENSIVE METABOLIC PANEL
ALT: 34 U/L (ref 0–53)
AST: 32 U/L (ref 0–37)
Albumin: 4.7 g/dL (ref 3.5–5.2)
BUN: 22 mg/dL (ref 6–23)
CO2: 24 mEq/L (ref 19–32)
Calcium: 10.7 mg/dL — ABNORMAL HIGH (ref 8.4–10.5)
Chloride: 102 mEq/L (ref 96–112)
Creatinine, Ser: 1.06 mg/dL (ref 0.40–1.50)
Potassium: 4.2 mEq/L (ref 3.5–5.3)

## 2010-07-23 LAB — CBC WITH DIFFERENTIAL/PLATELET
BASO%: 0.1 % (ref 0.0–2.0)
Eosinophils Absolute: 0 10*3/uL (ref 0.0–0.5)
HCT: 39.4 % (ref 38.4–49.9)
LYMPH%: 10.9 % — ABNORMAL LOW (ref 14.0–49.0)
MCHC: 34 g/dL (ref 32.0–36.0)
MONO#: 0.7 10*3/uL (ref 0.1–0.9)
NEUT#: 5.5 10*3/uL (ref 1.5–6.5)
NEUT%: 78.6 % — ABNORMAL HIGH (ref 39.0–75.0)
Platelets: 225 10*3/uL (ref 140–400)
WBC: 7.1 10*3/uL (ref 4.0–10.3)
lymph#: 0.8 10*3/uL — ABNORMAL LOW (ref 0.9–3.3)

## 2010-07-23 LAB — UA PROTEIN, DIPSTICK - CHCC: Protein, Urine: NEGATIVE mg/dL

## 2010-08-10 ENCOUNTER — Ambulatory Visit (HOSPITAL_COMMUNITY)
Admission: RE | Admit: 2010-08-10 | Discharge: 2010-08-10 | Disposition: A | Discharge status: Home / Self Care | Payer: Medicare Other | Source: Ambulatory Visit | Attending: Internal Medicine | Admitting: Internal Medicine

## 2010-08-10 ENCOUNTER — Encounter (HOSPITAL_COMMUNITY): Payer: Self-pay

## 2010-08-10 DIAGNOSIS — C3490 Malignant neoplasm of unspecified part of unspecified bronchus or lung: Secondary | ICD-10-CM | POA: Insufficient documentation

## 2010-08-10 MED ORDER — IOHEXOL 300 MG/ML  SOLN
100.0000 mL | Freq: Once | INTRAMUSCULAR | Status: AC | PRN
  Administered 2010-08-10: 100 mL via INTRAVENOUS

## 2010-08-11 NOTE — Op Note (Signed)
NAMESABIEN, UMLAND             ACCOUNT NO.:  [id number]   MEDICAL RECORD NO.:  [id number]          PATIENT TYPE:  INP   LOCATION:  1621                         FACILITY[facility]  [facility]   PHYSICIAN:  Johnette Abraham, MD    DATE OF BIRTH:  March 30, 1945   DATE OF PROCEDURE:  02/17/2007  DATE OF DISCHARGE:                               OPERATIVE REPORT   PREOPERATIVE DIAGNOSIS:  Status post degloving injury to the left ring  finger; status post revascularization of the left ring finger on  February 16, 2007, currently with ischemia of the left ring finger.   POSTOPERATIVE DIAGNOSIS:  Status post degloving injury to the left ring  finger status post revascularization of the left ring finger on February 16, 2007, currently with ischemia of the left ring finger.   PROCEDURE:  Exploration of the wound in the left ring finger and  amputation of the left ring finger through the proximal phalanx.   SURGEON:  Harrill C. Izora Ribas, MD.   ASSISTANT:  None.   ANESTHESIA:  General.   FINDINGS:  The skin both proximally and distally to the injury was  ischemic due to the nature of the degloving-type injury.  The artery,  which was anastomosed less than 24 hours ago, was thrombosed.  There  were no other suitable vessels identified on this exploration for  reanastomosis or revascularization.   SPECIMENS:  Left ring finger sent to Pathology for identification.   ESTIMATED BLOOD LOSS:  Minimal at 5 ml.   COMPLICATIONS:  No acute complications.  Patient taken to the PACU in  good condition.   INDICATIONS:  Mr. Moreland is a pleasant gentleman, who suffered a  devastating injury to his left ring finger on February 16, 2007.  Please  see the operative note for details.  Briefly, he had an ischemic  fingertip with near amputation, which was revascularized.  Overnight he  was watched and taken today for reexploration due to ischemic changes.  A long discussion was had with the patient and patient's  family both  after the procedure yesterday and before the procedure today regarding  the likelihood that this injury would result in amputation, all  questions were answered, consents obtained.   PROCEDURE:  The patient was taken to the operating room, general  anesthesia was administered.  The arm was prepped and draped in a normal  sterile fashion.  The tourniquet was placed on the arm, however, not  inflated to assess the blood flow to the extremity.  The external  appearance of the left ring finger was ischemic.  There were some  ischemic skin changes both proximally and distally to the injury.  The  sutures that were previously placed were removed.  The arterial  anastomosis was evaluated and found to be thrombosed.  The area of  thrombosis and the condition of that particular vessel on the radial  side was not suitable for repair or reanastomosis or revascularization.  The ulnar side was evaluated.  There was a smaller, proximally-based,  ulnar artery; however, the distal portion was mangled due to the injury  for this was  the side of more significant degloving of the skin and  subcutaneous tissues.  Therefore, no suitable vessels were found for  reanastomosis or revascularization.  The decision was then made to  proceed with amputation.  The skin and subcutaneous tissues were  debrided back to healthy, undamaged-appearing skin.  The neurovascular  bundles were cauterized and sharply divided proximally.  The flexor  tendon was retracted and cut sharply.  The bone was then resected back  to the proximal base of the proximal phalanx.  The condition of the skin  edges appeared to be good with some minor oozing.  Hemostasis was  obtained with direct pressure and then the skin flap both dorsally and  volarly was fashioned to create a nice closure.  Afterwards, a Xeroform  dressing as well as a sterile soft dressing was applied.  The patient  awakened from anesthesia and was taken to the  PACU in stable condition.      Johnette Abraham, MD  Electronically Signed     HCC/MEDQ  D:  02/17/2007  T:  02/18/2007  Job:  [id number]

## 2010-08-11 NOTE — Op Note (Signed)
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   PROCEDURE:  1. Exploration of complex wound.  2. Open reduction, internal fixation of the left ring finger middle      phalanx with a 0.45 K-wire.  3. Revascularization of the left ring finger, radial side.   SURGEON:  Dr. Izora Ribas   ANESTHESIA:  General.   FINDINGS:  Bilateral digital nerves were contused and stretched but  intact.  There is an open fracture of the proximal portion of the middle  phalanx.  The extensor tendon was completely lacerated.  The sagittal  bands were completely lacerated.  The finger was essentially held on by  the flexor tendon and the digital nerves.  Both vascular bundles on the  radial and ulnar side were transected.  The flexor tendon, again, was  intact.   SPECIMENS:  None.   ESTIMATED BLOOD LOSS:  20 mL.   INDICATIONS:  [name] is a pleasant 65 year old gentleman who was  working and fell and got his ring on his ring finger caught as he was  falling and suffered a degloving-type injury to his left ring finger.  He presented to the emergency department.  On examination, his finger  was insensate without any motor function present and was ischemic.  The  injury occurred, the patient states, approximately 2 p.m.  He was seen  in the emergency department approximately 6 p.m.  Risks, benefits, and  alternatives of surgery were discussed with the patient and the  patient's wife.  This injury was explained to the patient, and the  severity  was explained to the patient and the likelihood of not being  able to salvage the digit due to the avulsion and degloving-type injury  which stretched the neurovascular bundles.  The patient was then  consented for revascularization and possible amputation.   PROCEDURE:  The patient was taken to the operating room.  General  anesthesia was administered.  The arm was prepped and draped in the  normal sterile fashion.  An Esmarch was used.  The tourniquet was  inflated to 250 mmHg.  The wound was examined.  The patient's wedding  band was still on the finger.  This had to be cut off.  Following the  circumferential laceration was lengthened proximally and distally on  both the radial and ulnar sides to obtain exposure to the neurovascular  structures.  The radial side was isolated first.  The nerve appeared to  be contused and stretched, however, intact.  Both ends of the vessels  were isolated.  Following, the ulnar-sided neurovascular structures were  evaluated.  The nerve  was intact, however, contused and stretched.  The  vascular structures were less readily identifiable on the ulnar side.  The proximal portion was then identified, and the distal portion was  involved in a complex skin laceration avulsion, and the ends of the  vessel were much more distal with a gap of greater than 1 cm.  Following, the wound was irrigated.  The skin and subcutaneous tissue  were debrided.  The flexor tendons were intact.  On the dorsal side, the  extensor tendon was completely lacerated, and there was an open fracture  with comminution to the very proximal portion of the middle phalanx.  The distal portion of the bone was resected approximately 3 mm to aid in  revascularization and to more easily reduce the fracture.  With a  rongeur, this was performed.  Next, in a retrograde fashion, a 0.45 K-  wire was placed through the middle and proximal phalanx, reducing the  fracture.  Following, the hand was  turned, and a microscope was brought  into place.  The radial digital artery was again isolated and with  several interrupted 10-0 nylon sutures, the artery was approximated very  nicely.  The ulnar-sided vessels again were evaluated underneath the  microscope and again with the tissue loss, the revascularization on 1  side was not possible.  Afterwards, the tourniquet was released, and  there was flow through the anastomosis.  The hand was then turned, and  the dorsal side of the digit evaluated.  The skin flap extended from the  mid proximal phalanx essentially to the DIP joint.  This was a complex  laceration and skin flap that was denuded from bone.  Extensor tendon  was shredded.  Careful dissection proximally revealed 2 nice venous  structures; however, a prolonged attempt to find a suitable venous  structure distally was unsuccessful due to the complex nature of the  wound.  Skin edges circumferentially were then approximated with 5-0  nylon.  The K-wire was cut to length.  At the conclusion of the  procedure, the vascularity of the ring finger was questionable.  The  patient was placed in a large bulky dressing and posterior splint.  He  will be watched overnight and his family and the patient counseled on  the severity of the wound and the poor prognosis for successful  revascularization.      Johnette Abraham, MD  Electronically Signed     HCC/MEDQ  D:  02/16/2007  T:  02/17/2007  Job:  [id number]

## 2010-08-13 ENCOUNTER — Other Ambulatory Visit: Payer: Self-pay | Admitting: Internal Medicine

## 2010-08-13 ENCOUNTER — Encounter (HOSPITAL_BASED_OUTPATIENT_CLINIC_OR_DEPARTMENT_OTHER): Payer: Medicare Other | Admitting: Internal Medicine

## 2010-08-13 DIAGNOSIS — C3410 Malignant neoplasm of upper lobe, unspecified bronchus or lung: Secondary | ICD-10-CM

## 2010-08-13 DIAGNOSIS — Z5111 Encounter for antineoplastic chemotherapy: Secondary | ICD-10-CM

## 2010-08-13 DIAGNOSIS — Z5112 Encounter for antineoplastic immunotherapy: Secondary | ICD-10-CM

## 2010-08-13 DIAGNOSIS — Z1389 Encounter for screening for other disorder: Secondary | ICD-10-CM

## 2010-08-13 DIAGNOSIS — C3490 Malignant neoplasm of unspecified part of unspecified bronchus or lung: Secondary | ICD-10-CM

## 2010-08-13 LAB — COMPREHENSIVE METABOLIC PANEL
Albumin: 4.7 g/dL (ref 3.5–5.2)
Alkaline Phosphatase: 54 U/L (ref 39–117)
BUN: 21 mg/dL (ref 6–23)
Calcium: 10.4 mg/dL (ref 8.4–10.5)
Chloride: 101 mEq/L (ref 96–112)
Creatinine, Ser: 1.12 mg/dL (ref 0.40–1.50)
Glucose, Bld: 123 mg/dL — ABNORMAL HIGH (ref 70–99)
Potassium: 3.9 mEq/L (ref 3.5–5.3)

## 2010-08-13 LAB — CBC WITH DIFFERENTIAL/PLATELET
Basophils Absolute: 0 10*3/uL (ref 0.0–0.1)
EOS%: 0 % (ref 0.0–7.0)
HCT: 38.6 % (ref 38.4–49.9)
HGB: 13.4 g/dL (ref 13.0–17.1)
LYMPH%: 11.1 % — ABNORMAL LOW (ref 14.0–49.0)
MCH: 35 pg — ABNORMAL HIGH (ref 27.2–33.4)
MCHC: 34.7 g/dL (ref 32.0–36.0)
NEUT%: 78.7 % — ABNORMAL HIGH (ref 39.0–75.0)
Platelets: 219 10*3/uL (ref 140–400)
lymph#: 0.8 10*3/uL — ABNORMAL LOW (ref 0.9–3.3)

## 2010-08-13 LAB — UA PROTEIN, DIPSTICK - CHCC: Protein, Urine: <30 mg/dL

## 2010-09-03 ENCOUNTER — Other Ambulatory Visit: Payer: Self-pay | Admitting: Internal Medicine

## 2010-09-03 ENCOUNTER — Encounter (HOSPITAL_BASED_OUTPATIENT_CLINIC_OR_DEPARTMENT_OTHER): Payer: Medicare Other | Admitting: Internal Medicine

## 2010-09-03 DIAGNOSIS — Z1389 Encounter for screening for other disorder: Secondary | ICD-10-CM

## 2010-09-03 DIAGNOSIS — C3410 Malignant neoplasm of upper lobe, unspecified bronchus or lung: Secondary | ICD-10-CM

## 2010-09-03 DIAGNOSIS — C3490 Malignant neoplasm of unspecified part of unspecified bronchus or lung: Secondary | ICD-10-CM

## 2010-09-03 DIAGNOSIS — Z5111 Encounter for antineoplastic chemotherapy: Secondary | ICD-10-CM

## 2010-09-03 LAB — CBC WITH DIFFERENTIAL/PLATELET
Basophils Absolute: 0 10*3/uL (ref 0.0–0.1)
Eosinophils Absolute: 0 10*3/uL (ref 0.0–0.5)
HCT: 37.9 % — ABNORMAL LOW (ref 38.4–49.9)
HGB: 13.3 g/dL (ref 13.0–17.1)
NEUT#: 5.3 10*3/uL (ref 1.5–6.5)
RDW: 16.2 % — ABNORMAL HIGH (ref 11.0–14.6)
lymph#: 0.9 10*3/uL (ref 0.9–3.3)

## 2010-09-03 LAB — COMPREHENSIVE METABOLIC PANEL
AST: 31 U/L (ref 0–37)
Albumin: 4.4 g/dL (ref 3.5–5.2)
Alkaline Phosphatase: 59 U/L (ref 39–117)
BUN: 17 mg/dL (ref 6–23)
Calcium: 9.8 mg/dL (ref 8.4–10.5)
Chloride: 102 mEq/L (ref 96–112)
Creatinine, Ser: 1.06 mg/dL (ref 0.50–1.35)
Glucose, Bld: 102 mg/dL — ABNORMAL HIGH (ref 70–99)
Potassium: 4.1 mEq/L (ref 3.5–5.3)

## 2010-09-03 LAB — UA PROTEIN, DIPSTICK - CHCC: Protein, Urine: NEGATIVE mg/dL

## 2010-09-23 ENCOUNTER — Other Ambulatory Visit: Payer: Self-pay | Admitting: Internal Medicine

## 2010-09-23 ENCOUNTER — Encounter (HOSPITAL_BASED_OUTPATIENT_CLINIC_OR_DEPARTMENT_OTHER): Payer: Medicare Other | Admitting: Internal Medicine

## 2010-09-23 DIAGNOSIS — C3490 Malignant neoplasm of unspecified part of unspecified bronchus or lung: Secondary | ICD-10-CM

## 2010-09-23 DIAGNOSIS — Z5111 Encounter for antineoplastic chemotherapy: Secondary | ICD-10-CM

## 2010-09-23 DIAGNOSIS — Z1389 Encounter for screening for other disorder: Secondary | ICD-10-CM

## 2010-09-23 DIAGNOSIS — C3410 Malignant neoplasm of upper lobe, unspecified bronchus or lung: Secondary | ICD-10-CM

## 2010-09-23 DIAGNOSIS — Z5112 Encounter for antineoplastic immunotherapy: Secondary | ICD-10-CM

## 2010-09-23 LAB — COMPREHENSIVE METABOLIC PANEL
ALT: 35 U/L (ref 0–53)
Albumin: 4.1 g/dL (ref 3.5–5.2)
CO2: 27 mEq/L (ref 19–32)
Calcium: 10.4 mg/dL (ref 8.4–10.5)
Chloride: 99 mEq/L (ref 96–112)
Glucose, Bld: 135 mg/dL — ABNORMAL HIGH (ref 70–99)
Potassium: 3.6 mEq/L (ref 3.5–5.3)
Sodium: 139 mEq/L (ref 135–145)
Total Protein: 6.7 g/dL (ref 6.0–8.3)

## 2010-09-23 LAB — CBC WITH DIFFERENTIAL/PLATELET
Basophils Absolute: 0 10*3/uL (ref 0.0–0.1)
Eosinophils Absolute: 0 10*3/uL (ref 0.0–0.5)
HCT: 36.3 % — ABNORMAL LOW (ref 38.4–49.9)
HGB: 12.6 g/dL — ABNORMAL LOW (ref 13.0–17.1)
NEUT#: 4.7 10*3/uL (ref 1.5–6.5)
NEUT%: 85 % — ABNORMAL HIGH (ref 39.0–75.0)
RDW: 15.6 % — ABNORMAL HIGH (ref 11.0–14.6)
lymph#: 0.5 10*3/uL — ABNORMAL LOW (ref 0.9–3.3)

## 2010-09-23 LAB — UA PROTEIN, DIPSTICK - CHCC: Protein, Urine: 30 mg/dL

## 2010-10-12 ENCOUNTER — Ambulatory Visit (HOSPITAL_COMMUNITY)
Admission: RE | Admit: 2010-10-12 | Discharge: 2010-10-12 | Disposition: A | Discharge status: Home / Self Care | Payer: Medicare Other | Source: Ambulatory Visit | Attending: Internal Medicine | Admitting: Internal Medicine

## 2010-10-12 ENCOUNTER — Encounter (HOSPITAL_COMMUNITY): Payer: Self-pay

## 2010-10-12 DIAGNOSIS — C3490 Malignant neoplasm of unspecified part of unspecified bronchus or lung: Secondary | ICD-10-CM | POA: Insufficient documentation

## 2010-10-12 DIAGNOSIS — Q619 Cystic kidney disease, unspecified: Secondary | ICD-10-CM | POA: Insufficient documentation

## 2010-10-12 HISTORY — DX: Essential (primary) hypertension: I10

## 2010-10-12 MED ORDER — IOHEXOL 300 MG/ML  SOLN
100.0000 mL | Freq: Once | INTRAMUSCULAR | Status: AC | PRN
  Administered 2010-10-12: 100 mL via INTRAVENOUS

## 2010-10-15 ENCOUNTER — Other Ambulatory Visit: Payer: Self-pay | Admitting: Internal Medicine

## 2010-10-15 ENCOUNTER — Encounter (HOSPITAL_BASED_OUTPATIENT_CLINIC_OR_DEPARTMENT_OTHER): Payer: Medicare Other | Admitting: Internal Medicine

## 2010-10-15 DIAGNOSIS — C3410 Malignant neoplasm of upper lobe, unspecified bronchus or lung: Secondary | ICD-10-CM

## 2010-10-15 DIAGNOSIS — Z1389 Encounter for screening for other disorder: Secondary | ICD-10-CM

## 2010-10-15 DIAGNOSIS — Z5112 Encounter for antineoplastic immunotherapy: Secondary | ICD-10-CM

## 2010-10-15 DIAGNOSIS — Z5111 Encounter for antineoplastic chemotherapy: Secondary | ICD-10-CM

## 2010-10-15 LAB — COMPREHENSIVE METABOLIC PANEL
ALT: 27 U/L (ref 0–53)
BUN: 22 mg/dL (ref 6–23)
CO2: 22 mEq/L (ref 19–32)
Calcium: 9.8 mg/dL (ref 8.4–10.5)
Chloride: 97 mEq/L (ref 96–112)
Creatinine, Ser: 1.23 mg/dL (ref 0.50–1.35)

## 2010-10-15 LAB — CBC WITH DIFFERENTIAL/PLATELET
Basophils Absolute: 0 10*3/uL (ref 0.0–0.1)
Eosinophils Absolute: 0 10*3/uL (ref 0.0–0.5)
HGB: 13.4 g/dL (ref 13.0–17.1)
LYMPH%: 13.1 % — ABNORMAL LOW (ref 14.0–49.0)
MCV: 101.6 fL — ABNORMAL HIGH (ref 79.3–98.0)
MONO%: 4 % (ref 0.0–14.0)
NEUT#: 3.3 10*3/uL (ref 1.5–6.5)
NEUT%: 82.6 % — ABNORMAL HIGH (ref 39.0–75.0)
Platelets: 209 10*3/uL (ref 140–400)

## 2010-10-15 LAB — UA PROTEIN, DIPSTICK - CHCC: Protein, Urine: <30 mg/dL

## 2010-11-05 ENCOUNTER — Other Ambulatory Visit: Payer: Self-pay | Admitting: Internal Medicine

## 2010-11-05 ENCOUNTER — Encounter (HOSPITAL_BASED_OUTPATIENT_CLINIC_OR_DEPARTMENT_OTHER): Payer: Medicare Other | Admitting: Internal Medicine

## 2010-11-05 DIAGNOSIS — Z1389 Encounter for screening for other disorder: Secondary | ICD-10-CM

## 2010-11-05 DIAGNOSIS — Z5112 Encounter for antineoplastic immunotherapy: Secondary | ICD-10-CM

## 2010-11-05 DIAGNOSIS — Z5111 Encounter for antineoplastic chemotherapy: Secondary | ICD-10-CM

## 2010-11-05 DIAGNOSIS — C3410 Malignant neoplasm of upper lobe, unspecified bronchus or lung: Secondary | ICD-10-CM

## 2010-11-05 LAB — COMPREHENSIVE METABOLIC PANEL
ALT: 25 U/L (ref 0–53)
AST: 30 U/L (ref 0–37)
Albumin: 4.6 g/dL (ref 3.5–5.2)
Alkaline Phosphatase: 78 U/L (ref 39–117)
Glucose, Bld: 158 mg/dL — ABNORMAL HIGH (ref 70–99)
Potassium: 4.1 mEq/L (ref 3.5–5.3)
Sodium: 137 mEq/L (ref 135–145)
Total Protein: 6.5 g/dL (ref 6.0–8.3)

## 2010-11-05 LAB — UA PROTEIN, DIPSTICK - CHCC: Protein, Urine: <30 mg/dL

## 2010-11-05 LAB — CBC WITH DIFFERENTIAL/PLATELET
BASO%: 0.1 % (ref 0.0–2.0)
HCT: 35.6 % — ABNORMAL LOW (ref 38.4–49.9)
MCHC: 34.3 g/dL (ref 32.0–36.0)
MONO#: 0.3 10*3/uL (ref 0.1–0.9)
NEUT%: 88.9 % — ABNORMAL HIGH (ref 39.0–75.0)
WBC: 6.9 10*3/uL (ref 4.0–10.3)
lymph#: 0.5 10*3/uL — ABNORMAL LOW (ref 0.9–3.3)
nRBC: 0 % (ref 0–0)

## 2010-11-26 ENCOUNTER — Encounter (HOSPITAL_BASED_OUTPATIENT_CLINIC_OR_DEPARTMENT_OTHER): Payer: Medicare Other | Admitting: Internal Medicine

## 2010-11-26 ENCOUNTER — Other Ambulatory Visit: Payer: Self-pay | Admitting: Internal Medicine

## 2010-11-26 DIAGNOSIS — Z5111 Encounter for antineoplastic chemotherapy: Secondary | ICD-10-CM

## 2010-11-26 DIAGNOSIS — C3490 Malignant neoplasm of unspecified part of unspecified bronchus or lung: Secondary | ICD-10-CM

## 2010-11-26 DIAGNOSIS — C3410 Malignant neoplasm of upper lobe, unspecified bronchus or lung: Secondary | ICD-10-CM

## 2010-11-26 DIAGNOSIS — Z5112 Encounter for antineoplastic immunotherapy: Secondary | ICD-10-CM

## 2010-11-26 DIAGNOSIS — Z1389 Encounter for screening for other disorder: Secondary | ICD-10-CM

## 2010-11-26 LAB — CBC WITH DIFFERENTIAL/PLATELET
Basophils Absolute: 0 10*3/uL (ref 0.0–0.1)
Eosinophils Absolute: 0 10*3/uL (ref 0.0–0.5)
HGB: 12.3 g/dL — ABNORMAL LOW (ref 13.0–17.1)
MCV: 106.3 fL — ABNORMAL HIGH (ref 79.3–98.0)
MONO#: 0.7 10*3/uL (ref 0.1–0.9)
NEUT#: 5.5 10*3/uL (ref 1.5–6.5)
RBC: 3.34 10*6/uL — ABNORMAL LOW (ref 4.20–5.82)
RDW: 17.2 % — ABNORMAL HIGH (ref 11.0–14.6)
WBC: 6.8 10*3/uL (ref 4.0–10.3)

## 2010-11-26 LAB — COMPREHENSIVE METABOLIC PANEL
ALT: 27 U/L (ref 0–53)
Albumin: 4.5 g/dL (ref 3.5–5.2)
CO2: 22 mEq/L (ref 19–32)
Calcium: 10.1 mg/dL (ref 8.4–10.5)
Chloride: 103 mEq/L (ref 96–112)
Glucose, Bld: 126 mg/dL — ABNORMAL HIGH (ref 70–99)
Potassium: 4.2 mEq/L (ref 3.5–5.3)
Sodium: 137 mEq/L (ref 135–145)
Total Protein: 6.6 g/dL (ref 6.0–8.3)

## 2010-11-26 LAB — UA PROTEIN, DIPSTICK - CHCC: Protein, Urine: <30 mg/dL

## 2010-12-03 ENCOUNTER — Encounter: Payer: Self-pay | Admitting: *Deleted

## 2010-12-04 ENCOUNTER — Ambulatory Visit (INDEPENDENT_AMBULATORY_CARE_PROVIDER_SITE_OTHER): Payer: Medicare Other | Admitting: Pulmonary Disease

## 2010-12-04 ENCOUNTER — Encounter: Payer: Self-pay | Admitting: Pulmonary Disease

## 2010-12-04 VITALS — BP 120/82 | HR 64 | Temp 97.9°F | Ht 66.0 in | Wt 139.2 lb

## 2010-12-04 DIAGNOSIS — R0989 Other specified symptoms and signs involving the circulatory and respiratory systems: Secondary | ICD-10-CM

## 2010-12-04 DIAGNOSIS — C3410 Malignant neoplasm of upper lobe, unspecified bronchus or lung: Secondary | ICD-10-CM

## 2010-12-04 DIAGNOSIS — R05 Cough: Secondary | ICD-10-CM

## 2010-12-04 DIAGNOSIS — Z23 Encounter for immunization: Secondary | ICD-10-CM

## 2010-12-04 DIAGNOSIS — R0600 Dyspnea, unspecified: Secondary | ICD-10-CM

## 2010-12-04 NOTE — Progress Notes (Signed)
Addended by: Julaine Hua on: 12/04/2010 05:31 PM   Modules accepted: Orders

## 2010-12-04 NOTE — Progress Notes (Signed)
  Subjective:    Patient ID: [name], male    DOB: [birth year], 65 y.o.   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 He reports DOE x 1 yr , progressive, eg on climbing stairs,  cannot swing a golf club Last CT chest/abd 7/12 shows mild pericardial fluid thickening & radiation changes in medial lung fields , no evidence of metastatic disease C/o occ sinus congestion, no heartburn on omeprazole, no wheeze Spirometry showed mild obstruction but preserved FEv1 at 97%  Review of Systems Pt denies any significant  nasal congestion or excess secretions, fever, chills, sweats, unintended wt loss, pleuritic or exertional cp, orthopnea pnd or leg swelling.  Pt also denies any obvious fluctuation in symptoms with weather or environmental change or other alleviating or aggravating factors.    Pt denies any increase in rescue therapy over baseline, denies waking up needing it or having early am exacerbations or coughing/wheezing/ or dyspnea      Objective:   Physical Exam  Gen. Pleasant, well-nourished, in no distress ENT - no lesions, no post nasal drip Neck: No JVD, no thyromegaly, no carotid bruits Lungs: no use of accessory muscles, no dullness to percussion, clear without rales or rhonchi  Cardiovascular: Rhythm regular, heart sounds  normal, no murmurs or gallops, no peripheral edema Musculoskeletal: No deformities, no cyanosis or clubbing         Assessment & Plan:

## 2010-12-04 NOTE — Assessment & Plan Note (Signed)
Trial of albuterol MDI prn If no improvement, consider  pulm rehab for deconditioning - he is hesitant but will call back

## 2010-12-04 NOTE — Patient Instructions (Signed)
Lung functoion appears OK Trial of albuterol inhaler 2 puffs as needed upto thrice daily If nobetter, call back & we can enroll in rehab/ exercise program

## 2010-12-18 ENCOUNTER — Ambulatory Visit (HOSPITAL_COMMUNITY)
Admission: RE | Admit: 2010-12-18 | Discharge: 2010-12-18 | Disposition: A | Discharge status: Home / Self Care | Payer: Medicare Other | Source: Ambulatory Visit | Attending: Internal Medicine | Admitting: Internal Medicine

## 2010-12-18 ENCOUNTER — Encounter (HOSPITAL_COMMUNITY): Payer: Self-pay

## 2010-12-18 DIAGNOSIS — Q619 Cystic kidney disease, unspecified: Secondary | ICD-10-CM | POA: Insufficient documentation

## 2010-12-18 DIAGNOSIS — I700 Atherosclerosis of aorta: Secondary | ICD-10-CM | POA: Insufficient documentation

## 2010-12-18 DIAGNOSIS — I77811 Abdominal aortic ectasia: Secondary | ICD-10-CM | POA: Insufficient documentation

## 2010-12-18 DIAGNOSIS — I2510 Atherosclerotic heart disease of native coronary artery without angina pectoris: Secondary | ICD-10-CM | POA: Insufficient documentation

## 2010-12-18 DIAGNOSIS — C3490 Malignant neoplasm of unspecified part of unspecified bronchus or lung: Secondary | ICD-10-CM | POA: Insufficient documentation

## 2010-12-18 DIAGNOSIS — J438 Other emphysema: Secondary | ICD-10-CM | POA: Insufficient documentation

## 2010-12-18 MED ORDER — IOHEXOL 300 MG/ML  SOLN
100.0000 mL | Freq: Once | INTRAMUSCULAR | Status: AC | PRN
  Administered 2010-12-18: 100 mL via INTRAVENOUS

## 2010-12-22 ENCOUNTER — Other Ambulatory Visit: Payer: Self-pay | Admitting: Internal Medicine

## 2010-12-22 ENCOUNTER — Encounter (HOSPITAL_BASED_OUTPATIENT_CLINIC_OR_DEPARTMENT_OTHER): Payer: Medicare Other | Admitting: Internal Medicine

## 2010-12-22 DIAGNOSIS — Z1389 Encounter for screening for other disorder: Secondary | ICD-10-CM

## 2010-12-22 DIAGNOSIS — C3410 Malignant neoplasm of upper lobe, unspecified bronchus or lung: Secondary | ICD-10-CM

## 2010-12-22 DIAGNOSIS — Z5111 Encounter for antineoplastic chemotherapy: Secondary | ICD-10-CM

## 2010-12-22 DIAGNOSIS — Z5112 Encounter for antineoplastic immunotherapy: Secondary | ICD-10-CM

## 2010-12-22 LAB — CBC WITH DIFFERENTIAL/PLATELET
Basophils Absolute: 0 10*3/uL (ref 0.0–0.1)
Eosinophils Absolute: 0 10*3/uL (ref 0.0–0.5)
HGB: 12.4 g/dL — ABNORMAL LOW (ref 13.0–17.1)
NEUT#: 6 10*3/uL (ref 1.5–6.5)
RDW: 15.1 % — ABNORMAL HIGH (ref 11.0–14.6)
lymph#: 0.6 10*3/uL — ABNORMAL LOW (ref 0.9–3.3)

## 2010-12-22 LAB — UA PROTEIN, DIPSTICK - CHCC: Protein, Urine: NEGATIVE mg/dL

## 2010-12-22 LAB — COMPREHENSIVE METABOLIC PANEL
ALT: 23 U/L (ref 0–53)
AST: 30 U/L (ref 0–37)
Alkaline Phosphatase: 60 U/L (ref 39–117)
Creatinine, Ser: 1.19 mg/dL (ref 0.50–1.35)
Sodium: 136 mEq/L (ref 135–145)
Total Bilirubin: 0.7 mg/dL (ref 0.3–1.2)

## 2010-12-30 ENCOUNTER — Encounter: Payer: Self-pay | Admitting: *Deleted

## 2011-01-05 LAB — BASIC METABOLIC PANEL
BUN: 12
CO2: 22
Chloride: 111
Creatinine, Ser: 0.83
GFR calc Af Amer: >60
Glucose, Bld: 97

## 2011-01-05 LAB — DIFFERENTIAL
Basophils Relative: 0
Eosinophils Absolute: 0 — ABNORMAL LOW
Monocytes Relative: 6
Neutrophils Relative %: 80 — ABNORMAL HIGH

## 2011-01-05 LAB — CBC
MCHC: 35.7
MCV: 90.7
RBC: 3.63 — ABNORMAL LOW
RDW: 12.5

## 2011-01-06 ENCOUNTER — Encounter: Payer: Self-pay | Admitting: *Deleted

## 2011-01-08 ENCOUNTER — Telehealth: Payer: Self-pay | Admitting: Pulmonary Disease

## 2011-01-08 DIAGNOSIS — R0600 Dyspnea, unspecified: Secondary | ICD-10-CM

## 2011-01-08 DIAGNOSIS — C3410 Malignant neoplasm of upper lobe, unspecified bronchus or lung: Secondary | ICD-10-CM

## 2011-01-08 NOTE — Telephone Encounter (Signed)
[email or web address]  

## 2011-01-08 NOTE — Telephone Encounter (Signed)
PT RETURNED CALL . Kathleen W Perdue  °

## 2011-01-08 NOTE — Telephone Encounter (Signed)
Pt called to let RA know that using the Albuterol inhaler before activity did not help his sob. He would like to go ahead and get started in pulmonary rehab. Per OV note on 9/7/2012m RA stated this could be done if the inhaler did not help. Will send order to [facility] and forward msg to RA so he is aware.

## 2011-01-08 NOTE — Telephone Encounter (Signed)
ok 

## 2011-01-12 ENCOUNTER — Other Ambulatory Visit: Payer: Self-pay | Admitting: Internal Medicine

## 2011-01-12 ENCOUNTER — Encounter (HOSPITAL_BASED_OUTPATIENT_CLINIC_OR_DEPARTMENT_OTHER): Payer: Medicare Other | Admitting: Internal Medicine

## 2011-01-12 DIAGNOSIS — Z1389 Encounter for screening for other disorder: Secondary | ICD-10-CM

## 2011-01-12 DIAGNOSIS — Z5112 Encounter for antineoplastic immunotherapy: Secondary | ICD-10-CM

## 2011-01-12 DIAGNOSIS — Z5111 Encounter for antineoplastic chemotherapy: Secondary | ICD-10-CM

## 2011-01-12 DIAGNOSIS — C3410 Malignant neoplasm of upper lobe, unspecified bronchus or lung: Secondary | ICD-10-CM

## 2011-01-12 LAB — CBC WITH DIFFERENTIAL/PLATELET
BASO%: 0.2 % (ref 0.0–2.0)
Basophils Absolute: 0 10*3/uL (ref 0.0–0.1)
Eosinophils Absolute: 0 10*3/uL (ref 0.0–0.5)
HCT: 36 % — ABNORMAL LOW (ref 38.4–49.9)
HGB: 12.5 g/dL — ABNORMAL LOW (ref 13.0–17.1)
LYMPH%: 11.4 % — ABNORMAL LOW (ref 14.0–49.0)
MONO#: 0.8 10*3/uL (ref 0.1–0.9)
NEUT#: 4.9 10*3/uL (ref 1.5–6.5)
NEUT%: 76.4 % — ABNORMAL HIGH (ref 39.0–75.0)
Platelets: 241 10*3/uL (ref 140–400)
WBC: 6.4 10*3/uL (ref 4.0–10.3)
lymph#: 0.7 10*3/uL — ABNORMAL LOW (ref 0.9–3.3)

## 2011-01-12 LAB — COMPREHENSIVE METABOLIC PANEL
AST: 29 U/L (ref 0–37)
Albumin: 4.5 g/dL (ref 3.5–5.2)
BUN: 28 mg/dL — ABNORMAL HIGH (ref 6–23)
CO2: 20 mEq/L (ref 19–32)
Calcium: 9.9 mg/dL (ref 8.4–10.5)
Chloride: 101 mEq/L (ref 96–112)
Creatinine, Ser: 1.26 mg/dL (ref 0.50–1.35)
Glucose, Bld: 150 mg/dL — ABNORMAL HIGH (ref 70–99)
Potassium: 4.1 mEq/L (ref 3.5–5.3)

## 2011-01-12 LAB — UA PROTEIN, DIPSTICK - CHCC: Protein, Urine: <30 mg/dL

## 2011-02-01 ENCOUNTER — Other Ambulatory Visit: Payer: Self-pay | Admitting: Physician Assistant

## 2011-02-01 DIAGNOSIS — C3410 Malignant neoplasm of upper lobe, unspecified bronchus or lung: Secondary | ICD-10-CM

## 2011-02-02 ENCOUNTER — Other Ambulatory Visit: Payer: Self-pay | Admitting: Internal Medicine

## 2011-02-02 ENCOUNTER — Telehealth: Payer: Self-pay | Admitting: Internal Medicine

## 2011-02-02 ENCOUNTER — Ambulatory Visit (HOSPITAL_BASED_OUTPATIENT_CLINIC_OR_DEPARTMENT_OTHER): Payer: Medicare Other

## 2011-02-02 ENCOUNTER — Ambulatory Visit (HOSPITAL_BASED_OUTPATIENT_CLINIC_OR_DEPARTMENT_OTHER): Payer: Medicare Other | Admitting: Physician Assistant

## 2011-02-02 ENCOUNTER — Other Ambulatory Visit (HOSPITAL_BASED_OUTPATIENT_CLINIC_OR_DEPARTMENT_OTHER): Payer: Medicare Other | Admitting: Lab

## 2011-02-02 VITALS — BP 132/75 | HR 60 | Temp 97.5°F | Ht 67.0 in | Wt 142.2 lb

## 2011-02-02 DIAGNOSIS — C3410 Malignant neoplasm of upper lobe, unspecified bronchus or lung: Secondary | ICD-10-CM

## 2011-02-02 DIAGNOSIS — C3490 Malignant neoplasm of unspecified part of unspecified bronchus or lung: Secondary | ICD-10-CM

## 2011-02-02 DIAGNOSIS — Z5111 Encounter for antineoplastic chemotherapy: Secondary | ICD-10-CM

## 2011-02-02 DIAGNOSIS — I10 Essential (primary) hypertension: Secondary | ICD-10-CM

## 2011-02-02 DIAGNOSIS — R05 Cough: Secondary | ICD-10-CM

## 2011-02-02 DIAGNOSIS — Z5112 Encounter for antineoplastic immunotherapy: Secondary | ICD-10-CM

## 2011-02-02 LAB — CBC WITH DIFFERENTIAL/PLATELET
EOS%: 0 % (ref 0.0–7.0)
Eosinophils Absolute: 0 10*3/uL (ref 0.0–0.5)
HGB: 12.6 g/dL — ABNORMAL LOW (ref 13.0–17.1)
MCV: 104.9 fL — ABNORMAL HIGH (ref 79.3–98.0)
MONO%: 6 % (ref 0.0–14.0)
NEUT#: 5.9 10*3/uL (ref 1.5–6.5)
RBC: 3.47 10*6/uL — ABNORMAL LOW (ref 4.20–5.82)
RDW: 15.7 % — ABNORMAL HIGH (ref 11.0–14.6)
lymph#: 0.7 10*3/uL — ABNORMAL LOW (ref 0.9–3.3)
nRBC: 0 % (ref 0–0)

## 2011-02-02 LAB — COMPREHENSIVE METABOLIC PANEL
AST: 33 U/L (ref 0–37)
BUN: 29 mg/dL — ABNORMAL HIGH (ref 6–23)
Calcium: 10.3 mg/dL (ref 8.4–10.5)
Chloride: 102 mEq/L (ref 96–112)
Creatinine, Ser: 1.13 mg/dL (ref 0.50–1.35)
Total Bilirubin: 0.6 mg/dL (ref 0.3–1.2)

## 2011-02-02 LAB — UA PROTEIN, DIPSTICK - CHCC: Protein, Urine: <30 mg/dL

## 2011-02-02 MED ORDER — SODIUM CHLORIDE 0.9 % IJ SOLN
10.0000 mL | INTRAMUSCULAR | Status: DC | PRN
  Administered 2011-02-02: 10 mL
  Filled 2011-02-02: qty 10

## 2011-02-02 MED ORDER — SODIUM CHLORIDE 0.9 % IV SOLN
Freq: Once | INTRAVENOUS | Status: AC
  Administered 2011-02-02: 14:00:00 via INTRAVENOUS

## 2011-02-02 MED ORDER — SODIUM CHLORIDE 0.9 % IV SOLN
15.0000 mg/kg | Freq: Once | INTRAVENOUS | Status: AC
  Administered 2011-02-02: 975 mg via INTRAVENOUS
  Filled 2011-02-02: qty 39

## 2011-02-02 MED ORDER — DEXAMETHASONE SODIUM PHOSPHATE 10 MG/ML IJ SOLN
10.0000 mg | Freq: Once | INTRAMUSCULAR | Status: AC
  Administered 2011-02-02: 10 mg via INTRAVENOUS

## 2011-02-02 MED ORDER — FOLIC ACID 1 MG PO TABS: 1.0000 mg | ORAL_TABLET | Freq: Every day | ORAL | Status: AC

## 2011-02-02 MED ORDER — FOLIVANE-PLUS PO CAPS
1.0000 | ORAL_CAPSULE | Freq: Every day | ORAL | Status: DC
Start: 2011-02-02 — End: 2011-03-17

## 2011-02-02 MED ORDER — SODIUM CHLORIDE 0.9 % IV SOLN
500.0000 mg/m2 | Freq: Once | INTRAVENOUS | Status: AC
  Administered 2011-02-02: 875 mg via INTRAVENOUS
  Filled 2011-02-02: qty 35

## 2011-02-02 MED ORDER — OMEPRAZOLE 40 MG PO CPDR: 40.0000 mg | DELAYED_RELEASE_CAPSULE | Freq: Every day | ORAL | Status: DC

## 2011-02-02 MED ORDER — HEPARIN SOD (PORK) LOCK FLUSH 100 UNIT/ML IV SOLN
500.0000 [IU] | Freq: Once | INTRAVENOUS | Status: AC | PRN
  Administered 2011-02-02: 500 [IU]
  Filled 2011-02-02: qty 5

## 2011-02-02 MED ORDER — ONDANSETRON 8 MG/50ML IVPB (CHCC)
8.0000 mg | Freq: Once | INTRAVENOUS | Status: AC
  Administered 2011-02-02: 8 mg via INTRAVENOUS

## 2011-02-02 NOTE — Progress Notes (Signed)
Hematology and Oncology Follow Up Visit  [name] [id number] 1945-12-15 65 y.o. 02/02/2011 4:39 PM  Principle Diagnosis: Metastatic non-small cell lung cancer adenocarcinoma diagnosed in September 2010   Prior Therapy: #1 status post 6 cycles of systemic chemotherapy with carboplatin, Alimta and Avastin given every 3 weeks last dose given 04/23/2009 with disease stabilization. #2 status post palliative radiotherapy to the mediastinum under the care of Dr. Michell Heinrich. The patient received a total dose of 3000 CT Y. and 12 fractions completed 01/21/2009.  Current therapy: Maintenance chemotherapy with Alimta at 500 mg per meter squared and Avastin at 15 mg per kilogram given every 3 weeks status post 29 cycles  Interim History:  Patient presents accompanied by his wife Velna Hatchet for scheduled followup appointment. In the interim he was seen by his primary care physician Dr. Lenise Arena and was started on Norvasc at a dose of 2.5 mg daily for his hypertension. Thus far he is tolerating this well with no change in his baseline cough. His medication list is reviewed and updated. He reports that he has been accepted into the pulmonary rehabilitation program. This will take place on Tuesdays and Fridays. He requests that his appointments and chemotherapy be moved to Wednesdays. He continues to have some shortness of breath with exertion which is intermittent. He requests prescription is for his folic acid his iron tablet and his omeprazole. He will need a 90 day supply.  Medications: I have reviewed the patient's current medications.  Allergies:  Allergies  Allergen Reactions  . Percocet (Oxycodone-Acetaminophen)     REACTION: vomit  . Zolpidem Tartrate     REACTION: diarrhea    Past Medical History, Surgical history, Social history, and Family History were reviewed and updated.  Review of Systems: Constitutional:  Negative for fever, chills, night sweats, anorexia, weight loss,  pain. Cardiovascular: dyspnea on exertion which is intermittent  Respiratory: positive for - shortness of breath Neurological: negative Dermatological: negative ENT: negative Skin negative Gastrointestinal: no abdominal pain, change in bowel habits, or black or bloody stools Genito-Urinary: no dysuria, trouble voiding, or hematuria Hematological and Lymphatic: negative Breast: negative Musculoskeletal: negative Remaining ROS negative.  Physical Exam: Blood pressure 132/75, pulse 60, temperature 97.5 F (36.4 C), temperature source Oral, height 5\' 7"  (1.702 m), weight 142 lb 3.2 oz (64.501 kg). ECOG:  General appearance: alert, cooperative and no distress Head: Normocephalic, without obvious abnormality, atraumatic, scalp lesions Mouth:no evidence of thrush or mucositis Neck: no adenopathy, no carotid bruit, no JVD, supple, symmetrical, trachea midline and thyroid not enlarged, symmetric, no tenderness/mass/nodules Lymph nodes: Cervical, supraclavicular, and axillary nodes normal. Resp: clear to auscultation bilaterally Cardio: regular rate and rhythm, S1, S2 normal, no murmur, click, rub or gallop GI: soft, non-tender; bowel sounds normal; no masses,  no organomegaly Extremities: extremities normal, atraumatic, no cyanosis or edema    Lab Results: Lab Results  Component Value Date   WBC 4.9 10/27/2009   HGB 12.6* 02/02/2011   HCT 36.4* 02/02/2011   MCV 104.9* 02/02/2011   PLT 196 02/02/2011     Chemistry      Component Value Date/Time   NA 136 01/12/2011 1001   K 4.1 01/12/2011 1001   CL 101 01/12/2011 1001   CO2 20 01/12/2011 1001   BUN 28* 01/12/2011 1001   CREATININE 1.26 01/12/2011 1001      Component Value Date/Time   CALCIUM 9.9 01/12/2011 1001   ALKPHOS 69 01/12/2011 1001   AST 29 01/12/2011 1001   ALT 28 01/12/2011 1001  BILITOT 0.6 01/12/2011 1001       Radiological Studies: chest X-ray none  Impression and Plan: This is a  pleasant 65 year old  white male with metastatic non-small cell lung cancer currently being treated with maintenance chemotherapy in the form of Alimta at 500 mg per meter squared and Avastin at 15 mg per kilogram now status post 29 cycles He will proceed with a scheduled cycle of maintenance chemotherapy today.Marland KitchenHe will followup with Dr. Arbutus Ped in 3 weeks with repeat CBC differential CMET, urine protein dipstick and CT of the chest abdomen and pelvis with contrast to reevaluate his disease.  Spent more than half the time coordinating care.    Conni Slipper, PA-C 11/6/20124:39 PM

## 2011-02-02 NOTE — Progress Notes (Signed)
Addended by: Conni Slipper on: 02/02/2011 06:24 PM   Modules accepted: Orders

## 2011-02-02 NOTE — Telephone Encounter (Signed)
gve the pt his nov 2012 appt calendar along with the ct scan appt °

## 2011-02-10 ENCOUNTER — Encounter (HOSPITAL_COMMUNITY): Payer: Self-pay

## 2011-02-10 ENCOUNTER — Encounter (HOSPITAL_COMMUNITY): Payer: Medicare Other

## 2011-02-10 DIAGNOSIS — C3410 Malignant neoplasm of upper lobe, unspecified bronchus or lung: Secondary | ICD-10-CM | POA: Insufficient documentation

## 2011-02-10 DIAGNOSIS — R059 Cough, unspecified: Secondary | ICD-10-CM | POA: Insufficient documentation

## 2011-02-10 DIAGNOSIS — R05 Cough: Secondary | ICD-10-CM | POA: Insufficient documentation

## 2011-02-10 DIAGNOSIS — J984 Other disorders of lung: Secondary | ICD-10-CM | POA: Insufficient documentation

## 2011-02-10 DIAGNOSIS — Z5189 Encounter for other specified aftercare: Secondary | ICD-10-CM | POA: Insufficient documentation

## 2011-02-10 NOTE — Progress Notes (Signed)
Demonstration and practice of PLB using pulse oximeter.  Patient able to return demonstration satisfactorily. Safety and hand hygiene in the exercise area reviewed with patient.  Patient voices understanding. 

## 2011-02-11 ENCOUNTER — Encounter (HOSPITAL_COMMUNITY)
Admission: RE | Admit: 2011-02-11 | Discharge: 2011-02-11 | Disposition: A | Discharge status: Home / Self Care | Payer: Medicare Other | Source: Ambulatory Visit | Attending: Pulmonary Disease | Admitting: Pulmonary Disease

## 2011-02-11 NOTE — Progress Notes (Signed)
First day of exercise in [facility]. Orientation to equipment use and safety, RPE and Dyspnea scale, rest breaks. Demonstration and practice of PLB At each exercise station. Tolerated exercise well. VSS.

## 2011-02-11 NOTE — Progress Notes (Signed)
First day of exercise in [facility].  Patient oriented to equipment use, safety, RPE and Dyspnea scale and rest breaks.   Demonstration and practice PLB technique on each exercise station.  Tolerated exercise well, vital signs stable.

## 2011-02-16 ENCOUNTER — Encounter (HOSPITAL_COMMUNITY)
Admission: RE | Admit: 2011-02-16 | Discharge: 2011-02-16 | Disposition: A | Discharge status: Home / Self Care | Payer: Medicare Other | Source: Ambulatory Visit | Attending: Pulmonary Disease | Admitting: Pulmonary Disease

## 2011-02-18 ENCOUNTER — Encounter (HOSPITAL_COMMUNITY): Payer: Medicare Other

## 2011-02-22 ENCOUNTER — Ambulatory Visit (HOSPITAL_COMMUNITY)
Admission: RE | Admit: 2011-02-22 | Discharge: 2011-02-22 | Disposition: A | Discharge status: Home / Self Care | Payer: Medicare Other | Source: Ambulatory Visit | Attending: Physician Assistant | Admitting: Physician Assistant

## 2011-02-22 ENCOUNTER — Telehealth: Payer: Self-pay | Admitting: Internal Medicine

## 2011-02-22 DIAGNOSIS — I700 Atherosclerosis of aorta: Secondary | ICD-10-CM | POA: Insufficient documentation

## 2011-02-22 DIAGNOSIS — C3410 Malignant neoplasm of upper lobe, unspecified bronchus or lung: Secondary | ICD-10-CM

## 2011-02-22 DIAGNOSIS — N281 Cyst of kidney, acquired: Secondary | ICD-10-CM | POA: Insufficient documentation

## 2011-02-22 DIAGNOSIS — J984 Other disorders of lung: Secondary | ICD-10-CM | POA: Insufficient documentation

## 2011-02-22 DIAGNOSIS — C3490 Malignant neoplasm of unspecified part of unspecified bronchus or lung: Secondary | ICD-10-CM | POA: Insufficient documentation

## 2011-02-22 MED ORDER — IOHEXOL 300 MG/ML  SOLN
100.0000 mL | Freq: Once | INTRAMUSCULAR | Status: AC | PRN
  Administered 2011-02-22: 100 mL via INTRAVENOUS

## 2011-02-22 NOTE — Telephone Encounter (Signed)
Returned pt call who wants to postpone jan chemo to 1 week later due to going out of town.  He should be due for chemo on jan 9th but wants to schedule it jan 16th -OK per Dr. Donnald Garre -pt notified

## 2011-02-23 ENCOUNTER — Ambulatory Visit: Payer: Medicare Other

## 2011-02-23 ENCOUNTER — Other Ambulatory Visit: Payer: Medicare Other | Admitting: Lab

## 2011-02-24 ENCOUNTER — Ambulatory Visit (HOSPITAL_BASED_OUTPATIENT_CLINIC_OR_DEPARTMENT_OTHER): Payer: Medicare Other

## 2011-02-24 ENCOUNTER — Ambulatory Visit: Payer: Medicare Other

## 2011-02-24 ENCOUNTER — Other Ambulatory Visit: Payer: Self-pay | Admitting: Internal Medicine

## 2011-02-24 ENCOUNTER — Ambulatory Visit (HOSPITAL_BASED_OUTPATIENT_CLINIC_OR_DEPARTMENT_OTHER): Payer: Medicare Other | Admitting: Internal Medicine

## 2011-02-24 ENCOUNTER — Other Ambulatory Visit (HOSPITAL_BASED_OUTPATIENT_CLINIC_OR_DEPARTMENT_OTHER): Payer: Medicare Other | Admitting: Lab

## 2011-02-24 DIAGNOSIS — C3410 Malignant neoplasm of upper lobe, unspecified bronchus or lung: Secondary | ICD-10-CM

## 2011-02-24 DIAGNOSIS — Z5112 Encounter for antineoplastic immunotherapy: Secondary | ICD-10-CM

## 2011-02-24 DIAGNOSIS — C3490 Malignant neoplasm of unspecified part of unspecified bronchus or lung: Secondary | ICD-10-CM

## 2011-02-24 DIAGNOSIS — Z5111 Encounter for antineoplastic chemotherapy: Secondary | ICD-10-CM

## 2011-02-24 LAB — COMPREHENSIVE METABOLIC PANEL
ALT: 28 U/L (ref 0–53)
AST: 33 U/L (ref 0–37)
Alkaline Phosphatase: 71 U/L (ref 39–117)
BUN: 21 mg/dL (ref 6–23)
Calcium: 10.4 mg/dL (ref 8.4–10.5)
Chloride: 102 mEq/L (ref 96–112)
Creatinine, Ser: 1.2 mg/dL (ref 0.50–1.35)
Total Bilirubin: 0.6 mg/dL (ref 0.3–1.2)

## 2011-02-24 LAB — CBC WITH DIFFERENTIAL/PLATELET
BASO%: 0.7 % (ref 0.0–2.0)
Basophils Absolute: 0.1 10*3/uL (ref 0.0–0.1)
EOS%: 0.9 % (ref 0.0–7.0)
HCT: 35.5 % — ABNORMAL LOW (ref 38.4–49.9)
HGB: 12.3 g/dL — ABNORMAL LOW (ref 13.0–17.1)
LYMPH%: 12.4 % — ABNORMAL LOW (ref 14.0–49.0)
MCH: 36.9 pg — ABNORMAL HIGH (ref 27.2–33.4)
MCHC: 34.5 g/dL (ref 32.0–36.0)
MCV: 106.8 fL — ABNORMAL HIGH (ref 79.3–98.0)
MONO%: 10.8 % (ref 0.0–14.0)
NEUT%: 75.2 % — ABNORMAL HIGH (ref 39.0–75.0)
lymph#: 1 10*3/uL (ref 0.9–3.3)

## 2011-02-24 LAB — UA PROTEIN, DIPSTICK - CHCC: Protein, Urine: NEGATIVE mg/dL

## 2011-02-24 MED ORDER — HEPARIN SOD (PORK) LOCK FLUSH 100 UNIT/ML IV SOLN
500.0000 [IU] | Freq: Once | INTRAVENOUS | Status: AC | PRN
  Administered 2011-02-24: 500 [IU]
  Filled 2011-02-24: qty 5

## 2011-02-24 MED ORDER — DEXAMETHASONE SODIUM PHOSPHATE 10 MG/ML IJ SOLN
10.0000 mg | Freq: Once | INTRAMUSCULAR | Status: AC
  Administered 2011-02-24: 10 mg via INTRAVENOUS

## 2011-02-24 MED ORDER — SODIUM CHLORIDE 0.9 % IV SOLN
Freq: Once | INTRAVENOUS | Status: AC
  Administered 2011-02-24: 13:00:00 via INTRAVENOUS

## 2011-02-24 MED ORDER — SODIUM CHLORIDE 0.9 % IV SOLN
500.0000 mg/m2 | Freq: Once | INTRAVENOUS | Status: AC
  Administered 2011-02-24: 875 mg via INTRAVENOUS
  Filled 2011-02-24: qty 35

## 2011-02-24 MED ORDER — SODIUM CHLORIDE 0.9 % IJ SOLN
10.0000 mL | INTRAMUSCULAR | Status: DC | PRN
  Administered 2011-02-24: 10 mL
  Filled 2011-02-24: qty 10

## 2011-02-24 MED ORDER — BEVACIZUMAB CHEMO INJECTION 400 MG/16ML
15.0000 mg/kg | Freq: Once | INTRAVENOUS | Status: AC
  Administered 2011-02-24: 975 mg via INTRAVENOUS
  Filled 2011-02-24: qty 39

## 2011-02-24 MED ORDER — ONDANSETRON 8 MG/50ML IVPB (CHCC)
8.0000 mg | Freq: Once | INTRAVENOUS | Status: AC
  Administered 2011-02-24: 8 mg via INTRAVENOUS

## 2011-02-24 MED ORDER — CYANOCOBALAMIN 1000 MCG/ML IJ SOLN: 1000.0000 ug | Freq: Once | INTRAMUSCULAR | Status: DC

## 2011-02-24 NOTE — Progress Notes (Signed)
[facility] OFFICE PROGRESS NOTE  Cynda Familia, MD [address] [facility] [address] [facility] [place], [place]. [place] [address]  DIAGNOSIS: Metastatic non-small cell lung cancer adenocarcinoma diagnosed in September 2010  PRIOR THERAPY: #1 status post 6 cycles of systemic chemotherapy with carboplatin, Alimta and Avastin given every 3 weeks last dose given 04/23/2009 with disease stabilization.  #2 status post palliative radiotherapy to the mediastinum under the care of Dr. Michell Heinrich. The patient received a total dose of 3000 cGY and 12 fractions completed 01/21/2009.   CURRENT THERAPY: Maintenance chemotherapy with Alimta at 500 mg per meter squared and Avastin at 15 mg per kilogram given every 3 weeks status post 30 cycles.   INTERVAL HISTORY: [name] 65 y.o. male returns to the clinic today for followup visit. The patient has no complaints today. He denied having any significant chest pain, shortness breath, cough or hemoptysis. He has no nausea or vomiting and no significant weight loss. The patient tolerated the last cycle of his chemotherapy fairly well. He has repeat CT scan of the chest, abdomen and pelvis performed on 02/22/2011 and he is here for evaluation and discussion of his scan results.  MEDICAL HISTORY: Past Medical History  Diagnosis Date  . Hypertension   . Arthritis   . Lung cancer     lung ca dx 11/2008  . Syncope and collapse     ALLERGIES:  is allergic to percocet and zolpidem tartrate.  MEDICATIONS:  Current Outpatient Prescriptions  Medication Sig Dispense Refill  . amLODipine (NORVASC) 2.5 MG tablet Take 2.5 mg by mouth daily.        . B Complex-Biotin-FA (B-COMPLEX PO) Take by mouth. 1500mg  daily       . dexamethasone (DECADRON) 4 MG tablet Take 4 mg by mouth. 2 days before, day of, and after chemotherapy       . doxazosin (CARDURA) 2 MG tablet Take 2 mg by mouth at bedtime.        . FeFum-FePoly-FA-B Cmp-C-Biot (FOLIVANE-PLUS)  CAPS Take 1 capsule by mouth daily.  90 capsule  1  . fexofenadine (ALLEGRA) 180 MG tablet Take 180 mg by mouth daily.        . folic acid (FOLVITE) 1 MG tablet Take 1 tablet (1 mg total) by mouth daily.  90 tablet  3  . ibuprofen (ADVIL,MOTRIN) 200 MG tablet Take 200 mg by mouth every 6 (six) hours as needed.        . metoprolol-hydrochlorothiazide (LOPRESSOR HCT) 50-25 MG per tablet Take 1.5 tablets by mouth daily.      . Multiple Vitamin (MULTIVITAMIN) tablet Take 1 tablet by mouth daily.        Marland Kitchen omeprazole (PRILOSEC) 40 MG capsule Take 1 capsule (40 mg total) by mouth daily.  90 capsule  1  . prochlorperazine (COMPAZINE) 10 MG tablet Take 10 mg by mouth every 6 (six) hours as needed.        Bernadette Hoit Sodium (SENOKOT S PO) Take by mouth. 3 tabs daily       . Tamsulosin HCl (FLOMAX) 0.4 MG CAPS Take 1 tablet by mouth daily.        SURGICAL HISTORY:  Past Surgical History  Procedure Date  . 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 Rotator cuff repair     REVIEW OF SYSTEMS:  A comprehensive review of systems was negative.   PHYSICAL EXAMINATION: General appearance: alert, cooperative and no distress  Head: Normocephalic, without obvious abnormality, atraumatic Neck: no adenopathy Lymph nodes: Cervical, supraclavicular, and axillary nodes normal. Resp: clear to auscultation bilaterally Cardio: regular rate and rhythm, S1, S2 normal, no murmur, click, rub or gallop GI: soft, non-tender; bowel sounds normal; no masses,  no organomegaly Extremities: extremities normal, atraumatic, no cyanosis or edema Neurologic: Alert and oriented X 3, normal strength and tone. Normal symmetric reflexes. Normal coordination and gait  ECOG PERFORMANCE STATUS: 0 - Asymptomatic  Blood pressure 122/79, pulse 63, temperature 96.7 F (35.9 C), height 5\' 7"  (1.702 m), weight 139 lb 8 oz (63.277 kg).  LABORATORY DATA: Lab Results  Component Value Date   WBC 8.4 02/24/2011    HGB 12.3* 02/24/2011   HCT 35.5* 02/24/2011   MCV 106.8* 02/24/2011   PLT 235 02/24/2011      Chemistry      Component Value Date/Time   NA 138 02/02/2011 1123   NA 138 02/02/2011 1123   K 4.0 02/02/2011 1123   K 4.0 02/02/2011 1123   CL 102 02/02/2011 1123   CL 102 02/02/2011 1123   CO2 24 02/02/2011 1123   CO2 24 02/02/2011 1123   BUN 29* 02/02/2011 1123   BUN 29* 02/02/2011 1123   CREATININE 1.13 02/02/2011 1123   CREATININE 1.13 02/02/2011 1123      Component Value Date/Time   CALCIUM 10.3 02/02/2011 1123   CALCIUM 10.3 02/02/2011 1123   ALKPHOS 77 02/02/2011 1123   ALKPHOS 77 02/02/2011 1123   AST 33 02/02/2011 1123   AST 33 02/02/2011 1123   ALT 34 02/02/2011 1123   ALT 34 02/02/2011 1123   BILITOT 0.6 02/02/2011 1123   BILITOT 0.6 02/02/2011 1123       RADIOGRAPHIC STUDIES: Ct Chest W Contrast  02/22/2011  *RADIOLOGY REPORT*  Clinical Data:  Restaging lung cancer status post completion of radiation therapy.  Chemotherapy in progress.  CT CHEST, ABDOMEN AND PELVIS WITH CONTRAST  Technique:  Multidetector CT imaging of the chest, abdomen and pelvis was performed following the standard protocol during bolus administration of intravenous contrast.  Contrast: OMNIPAQUE IOHEXOL 300 MG/ML IV SOLN  Comparison:  Prior examinations 12/18/2010 and 10/12/2010.  CT CHEST  Findings:  There are stable paramediastinal changes medially in the right lung.  No recurrent mass or endobronchial lesion is demonstrated.  There is no confluent airspace opacity.  A small amount of pleural thickening appears unchanged.  There is no pleural or pericardial effusion.  The mediastinum appears unchanged.  There are no enlarged mediastinal or hilar lymph nodes.  Mild atherosclerosis appears stable.  A right IJ Port-A-Cath extends to the SVC right atrial junction.  There are no suspicious osseous findings.  IMPRESSION: Stable chest CT with stable paramediastinal radiation changes.  No evidence of local recurrence or  metastatic disease.  CT ABDOMEN AND PELVIS  Findings:  The liver, gallbladder, biliary system and pancreas remain unremarkable in appearance.  The spleen appears normal. There is no adrenal mass.  The kidneys are stable with a small cyst in the interpolar region of the left kidney and scarring in its upper pole.  The right kidney appears normal.  There is stable perinephric and generalized retroperitoneal soft tissue stranding.  There is no ascites or omental nodularity. There is questionable mild diffuse colonic wall thickening without associated distension.  The small bowel appears normal.  There is stable diffuse aortic atherosclerosis with irregular intramural thrombus.  No large vessel occlusion is identified. There are no enlarged abdominal pelvic  lymph nodes.  The urinary bladder, prostate gland and seminal vesicles appear unremarkable.  There are no acute or suspicious osseous findings.  IMPRESSION:  1.  Questionable mild diffuse colonic wall thickening may be infestation of incomplete distension.  If the patient has diarrhea, this could represent mild colitis. 2.  No evidence of abdominal pelvic metastatic disease. 3.  Stable left renal scarring and simple cyst. 4.  Stable diffuse atherosclerosis.  Original Report Authenticated By: Gerrianne Scale, M.D.   Ct Abdomen Pelvis W Contrast  02/22/2011  *RADIOLOGY REPORT*  Clinical Data:  Restaging lung cancer status post completion of radiation therapy.  Chemotherapy in progress.  CT CHEST, ABDOMEN AND PELVIS WITH CONTRAST  Technique:  Multidetector CT imaging of the chest, abdomen and pelvis was performed following the standard protocol during bolus administration of intravenous contrast.  Contrast: OMNIPAQUE IOHEXOL 300 MG/ML IV SOLN  Comparison:  Prior examinations 12/18/2010 and 10/12/2010.  CT CHEST  Findings:  There are stable paramediastinal changes medially in the right lung.  No recurrent mass or endobronchial lesion is demonstrated.  There  is no confluent airspace opacity.  A small amount of pleural thickening appears unchanged.  There is no pleural or pericardial effusion.  The mediastinum appears unchanged.  There are no enlarged mediastinal or hilar lymph nodes.  Mild atherosclerosis appears stable.  A right IJ Port-A-Cath extends to the SVC right atrial junction.  There are no suspicious osseous findings.  IMPRESSION: Stable chest CT with stable paramediastinal radiation changes.  No evidence of local recurrence or metastatic disease.  CT ABDOMEN AND PELVIS  Findings:  The liver, gallbladder, biliary system and pancreas remain unremarkable in appearance.  The spleen appears normal. There is no adrenal mass.  The kidneys are stable with a small cyst in the interpolar region of the left kidney and scarring in its upper pole.  The right kidney appears normal.  There is stable perinephric and generalized retroperitoneal soft tissue stranding.  There is no ascites or omental nodularity. There is questionable mild diffuse colonic wall thickening without associated distension.  The small bowel appears normal.  There is stable diffuse aortic atherosclerosis with irregular intramural thrombus.  No large vessel occlusion is identified. There are no enlarged abdominal pelvic lymph nodes.  The urinary bladder, prostate gland and seminal vesicles appear unremarkable.  There are no acute or suspicious osseous findings.  IMPRESSION:  1.  Questionable mild diffuse colonic wall thickening may be infestation of incomplete distension.  If the patient has diarrhea, this could represent mild colitis. 2.  No evidence of abdominal pelvic metastatic disease. 3.  Stable left renal scarring and simple cyst. 4.  Stable diffuse atherosclerosis.  Original Report Authenticated By: Gerrianne Scale, M.D.    ASSESSMENT: This is a very pleasant 65 years old white male with metastatic non-small cell lung cancer currently on maintenance treatment with Alimta and Avastin. The  patient is tolerating his treatment fairly well. He has no evidence for disease progression on his recent scans. I discussed the scan results with [name].  PLAN: recommended for him to continue on the current treatment for now. The patient will come back for followup visit in 3 weeks with the next cycle of his chemotherapy.   All questions were answered. The patient knows to call the clinic with any problems, questions or concerns. We can certainly see the patient much sooner if necessary.

## 2011-02-25 ENCOUNTER — Telehealth: Payer: Self-pay | Admitting: Internal Medicine

## 2011-02-25 ENCOUNTER — Encounter (HOSPITAL_COMMUNITY): Payer: Medicare Other

## 2011-02-25 NOTE — Telephone Encounter (Signed)
Called pt ,left message regarding appt for 12/18th and 12/19th

## 2011-02-27 ENCOUNTER — Telehealth: Payer: Self-pay | Admitting: Internal Medicine

## 2011-02-27 NOTE — Telephone Encounter (Signed)
Pt called and left vm Friday 11/30, called pt back, left message

## 2011-03-01 ENCOUNTER — Telehealth: Payer: Self-pay | Admitting: Internal Medicine

## 2011-03-01 NOTE — Telephone Encounter (Signed)
Called pt ,left message appt for 12/18th

## 2011-03-02 ENCOUNTER — Encounter (HOSPITAL_COMMUNITY)
Admission: RE | Admit: 2011-03-02 | Discharge: 2011-03-02 | Disposition: A | Discharge status: Home / Self Care | Payer: Medicare Other | Source: Ambulatory Visit | Attending: Pulmonary Disease | Admitting: Pulmonary Disease

## 2011-03-02 DIAGNOSIS — R059 Cough, unspecified: Secondary | ICD-10-CM | POA: Insufficient documentation

## 2011-03-02 DIAGNOSIS — C3410 Malignant neoplasm of upper lobe, unspecified bronchus or lung: Secondary | ICD-10-CM | POA: Insufficient documentation

## 2011-03-02 DIAGNOSIS — Z5189 Encounter for other specified aftercare: Secondary | ICD-10-CM | POA: Insufficient documentation

## 2011-03-02 DIAGNOSIS — J984 Other disorders of lung: Secondary | ICD-10-CM | POA: Insufficient documentation

## 2011-03-02 DIAGNOSIS — R05 Cough: Secondary | ICD-10-CM | POA: Insufficient documentation

## 2011-03-04 ENCOUNTER — Encounter (HOSPITAL_COMMUNITY)
Admission: RE | Admit: 2011-03-04 | Discharge: 2011-03-04 | Disposition: A | Discharge status: Home / Self Care | Payer: Medicare Other | Source: Ambulatory Visit | Attending: Pulmonary Disease | Admitting: Pulmonary Disease

## 2011-03-09 ENCOUNTER — Encounter (HOSPITAL_COMMUNITY)
Admission: RE | Admit: 2011-03-09 | Discharge: 2011-03-09 | Disposition: A | Discharge status: Home / Self Care | Payer: Medicare Other | Source: Ambulatory Visit | Attending: Pulmonary Disease | Admitting: Pulmonary Disease

## 2011-03-09 ENCOUNTER — Other Ambulatory Visit: Payer: Self-pay | Admitting: Certified Registered Nurse Anesthetist

## 2011-03-11 ENCOUNTER — Encounter (HOSPITAL_COMMUNITY)
Admission: RE | Admit: 2011-03-11 | Discharge: 2011-03-11 | Disposition: A | Discharge status: Home / Self Care | Payer: Medicare Other | Source: Ambulatory Visit | Attending: Pulmonary Disease | Admitting: Pulmonary Disease

## 2011-03-15 ENCOUNTER — Ambulatory Visit: Payer: Medicare Other | Admitting: Physician Assistant

## 2011-03-16 ENCOUNTER — Ambulatory Visit (HOSPITAL_BASED_OUTPATIENT_CLINIC_OR_DEPARTMENT_OTHER): Payer: Medicare Other | Admitting: Physician Assistant

## 2011-03-16 ENCOUNTER — Ambulatory Visit (HOSPITAL_BASED_OUTPATIENT_CLINIC_OR_DEPARTMENT_OTHER): Payer: Medicare Other

## 2011-03-16 ENCOUNTER — Other Ambulatory Visit (HOSPITAL_BASED_OUTPATIENT_CLINIC_OR_DEPARTMENT_OTHER): Payer: Medicare Other

## 2011-03-16 ENCOUNTER — Other Ambulatory Visit: Payer: Self-pay | Admitting: Physician Assistant

## 2011-03-16 ENCOUNTER — Encounter: Payer: Self-pay | Admitting: Physician Assistant

## 2011-03-16 ENCOUNTER — Encounter (HOSPITAL_COMMUNITY): Payer: Medicare Other

## 2011-03-16 ENCOUNTER — Other Ambulatory Visit: Payer: Medicare Other | Admitting: Lab

## 2011-03-16 ENCOUNTER — Other Ambulatory Visit: Payer: Self-pay | Admitting: Internal Medicine

## 2011-03-16 ENCOUNTER — Other Ambulatory Visit: Payer: Self-pay | Admitting: Certified Registered Nurse Anesthetist

## 2011-03-16 ENCOUNTER — Telehealth: Payer: Self-pay | Admitting: Internal Medicine

## 2011-03-16 VITALS — BP 131/83 | HR 72 | Temp 97.1°F | Ht 67.0 in | Wt 143.4 lb

## 2011-03-16 DIAGNOSIS — C3410 Malignant neoplasm of upper lobe, unspecified bronchus or lung: Secondary | ICD-10-CM

## 2011-03-16 DIAGNOSIS — C3490 Malignant neoplasm of unspecified part of unspecified bronchus or lung: Secondary | ICD-10-CM

## 2011-03-16 DIAGNOSIS — I10 Essential (primary) hypertension: Secondary | ICD-10-CM

## 2011-03-16 DIAGNOSIS — Z5111 Encounter for antineoplastic chemotherapy: Secondary | ICD-10-CM

## 2011-03-16 DIAGNOSIS — Z5112 Encounter for antineoplastic immunotherapy: Secondary | ICD-10-CM

## 2011-03-16 DIAGNOSIS — Z1389 Encounter for screening for other disorder: Secondary | ICD-10-CM

## 2011-03-16 LAB — CBC WITH DIFFERENTIAL/PLATELET
BASO%: 0.4 % (ref 0.0–2.0)
Basophils Absolute: 0 10*3/uL (ref 0.0–0.1)
EOS%: 3.5 % (ref 0.0–7.0)
HCT: 34 % — ABNORMAL LOW (ref 38.4–49.9)
HGB: 12 g/dL — ABNORMAL LOW (ref 13.0–17.1)
LYMPH%: 21 % (ref 14.0–49.0)
MCH: 37.1 pg — ABNORMAL HIGH (ref 27.2–33.4)
MCHC: 35.2 g/dL (ref 32.0–36.0)
MCV: 105.4 fL — ABNORMAL HIGH (ref 79.3–98.0)
NEUT%: 63.3 % (ref 39.0–75.0)
Platelets: 191 10*3/uL (ref 140–400)
lymph#: 1 10*3/uL (ref 0.9–3.3)

## 2011-03-16 LAB — COMPREHENSIVE METABOLIC PANEL
AST: 33 U/L (ref 0–37)
BUN: 20 mg/dL (ref 6–23)
Calcium: 9.1 mg/dL (ref 8.4–10.5)
Chloride: 102 mEq/L (ref 96–112)
Creatinine, Ser: 1.17 mg/dL (ref 0.50–1.35)
Total Bilirubin: <0.1 mg/dL — ABNORMAL LOW (ref 0.3–1.2)

## 2011-03-16 LAB — UA PROTEIN, DIPSTICK - CHCC: Protein, Urine: NEGATIVE mg/dL

## 2011-03-16 MED ORDER — CYANOCOBALAMIN 1000 MCG/ML IJ SOLN
1000.0000 ug | Freq: Once | INTRAMUSCULAR | Status: AC
  Administered 2011-03-16: 1000 ug via INTRAMUSCULAR

## 2011-03-16 MED ORDER — DEXAMETHASONE SODIUM PHOSPHATE 10 MG/ML IJ SOLN
10.0000 mg | Freq: Once | INTRAMUSCULAR | Status: AC
  Administered 2011-03-16: 10 mg via INTRAVENOUS

## 2011-03-16 MED ORDER — HEPARIN SOD (PORK) LOCK FLUSH 100 UNIT/ML IV SOLN
500.0000 [IU] | Freq: Once | INTRAVENOUS | Status: AC | PRN
  Administered 2011-03-16: 500 [IU]
  Filled 2011-03-16: qty 5

## 2011-03-16 MED ORDER — SODIUM CHLORIDE 0.9 % IV SOLN
500.0000 mg/m2 | Freq: Once | INTRAVENOUS | Status: AC
  Administered 2011-03-16: 875 mg via INTRAVENOUS
  Filled 2011-03-16: qty 35

## 2011-03-16 MED ORDER — SODIUM CHLORIDE 0.9 % IV SOLN
15.0000 mg/kg | Freq: Once | INTRAVENOUS | Status: AC
  Administered 2011-03-16: 975 mg via INTRAVENOUS
  Filled 2011-03-16: qty 39

## 2011-03-16 MED ORDER — ONDANSETRON 8 MG/50ML IVPB (CHCC)
8.0000 mg | Freq: Once | INTRAVENOUS | Status: AC
  Administered 2011-03-16: 8 mg via INTRAVENOUS

## 2011-03-16 MED ORDER — SODIUM CHLORIDE 0.9 % IV SOLN
Freq: Once | INTRAVENOUS | Status: AC
  Administered 2011-03-16: 11:00:00 via INTRAVENOUS

## 2011-03-16 MED ORDER — SODIUM CHLORIDE 0.9 % IJ SOLN
10.0000 mL | INTRAMUSCULAR | Status: DC | PRN
  Administered 2011-03-16: 10 mL
  Filled 2011-03-16: qty 10

## 2011-03-16 NOTE — Telephone Encounter (Signed)
gve the pt his jan 2013 appt calendar °

## 2011-03-16 NOTE — Patient Instructions (Signed)
[facility] Discharge Instructions for Patients Receiving Chemotherapy  Today you received the following chemotherapy agents: Alimta , Avastin  To help prevent nausea and vomiting after your treatment, we encourage you to take your nausea medication as instructed. Begin taking it as needed  and take it as often as prescribed .   If you develop nausea and vomiting that is not controlled by your nausea medication, call the clinic. If it is after clinic hours your family physician or the after hours number for the clinic or go to the Emergency Department.   BELOW ARE SYMPTOMS THAT SHOULD BE REPORTED IMMEDIATELY:  *FEVER GREATER THAN 100.5 F  *CHILLS WITH OR WITHOUT FEVER  NAUSEA AND VOMITING THAT IS NOT CONTROLLED WITH YOUR NAUSEA MEDICATION  *UNUSUAL SHORTNESS OF BREATH  *UNUSUAL BRUISING OR BLEEDING  TENDERNESS IN MOUTH AND THROAT WITH OR WITHOUT PRESENCE OF ULCERS  *URINARY PROBLEMS  *BOWEL PROBLEMS  UNUSUAL RASH Items with * indicate a potential emergency and should be followed up as soon as possible.  . Please let the nurse know about any problems that you may have experienced. Feel free to call the clinic you have any questions or concerns. The clinic phone number is [phone].   I have been informed and understand all the instructions given to me. I know to contact the clinic, my physician, or go to the Emergency Department if any problems should occur. I do not have any questions at this time, but understand that I may call the clinic during office hours   should I have any questions or need assistance in obtaining follow up care.    __________________________________________  _____________  __________ Signature of Patient or Authorized Representative            Date                   Time    __________________________________________ Nurse's Signature

## 2011-03-16 NOTE — Progress Notes (Signed)
[facility] OFFICE PROGRESS NOTE  Nathan Familia, MD [address] [facility] [address] [place], [place]. [place] [address]  DIAGNOSIS: Metastatic non-small cell lung cancer adenocarcinoma diagnosed in September 2010  PRIOR THERAPY: #1 status post 6 cycles of systemic chemotherapy with carboplatin, Alimta and Avastin given every 3 weeks last dose given 04/23/2009 with disease stabilization.  #2 status post palliative radiotherapy to the mediastinum under the care of Dr. Michell Heinrich. The patient received a total dose of 3000 cGY and 12 fractions completed 01/21/2009.   CURRENT THERAPY: Maintenance chemotherapy with Alimta at 500 mg per meter squared and Avastin at 15 mg per kilogram given every 3 weeks status post 31 cycles.   INTERVAL HISTORY: [name] 65 y.o. male returns to the clinic today for followup visit. The patient has no complaints today. He denied having any significant chest pain, shortness breath, cough or hemoptysis. He has no nausea or vomiting and no significant weight loss. He continues to tolerate his maintenance chemotherapy without difficulty. He denied any active bleeding or bruising. His blood pressure is under much better control. She reports that is soon as he runs out of his current prescription of metoprolol he will be changed to atenolol 50/25. He does not anticipate this atenolol being started and chills sometime in March. The changes being made primarily due to the rising cost of the metoprolol. He has changed mail-order prescription companies and is using [facility] however they  did not receive or seem to not have received his last refill on his [place]. He will call the mail-order company and a double check on this. If the prescription is indeed loss we will renew meds with the corrected information or if he's able to provide that for [place].  MEDICAL HISTORY: Past Medical History  Diagnosis Date  . Hypertension   . Arthritis   . Lung  cancer     lung ca dx 11/2008  . Syncope and collapse     ALLERGIES:  is allergic to percocet and zolpidem tartrate.  MEDICATIONS:  Current Outpatient Prescriptions  Medication Sig Dispense Refill  . amLODipine (NORVASC) 2.5 MG tablet Take 2.5 mg by mouth daily.        Marland Kitchen atenolol-chlorthalidone (TENORETIC) 50-25 MG per tablet       . B Complex-Biotin-FA (B-COMPLEX PO) Take by mouth. 1500mg  daily       . dexamethasone (DECADRON) 4 MG tablet Take 4 mg by mouth. 2 days before, day of, and after chemotherapy       . doxazosin (CARDURA) 2 MG tablet Take 2 mg by mouth at bedtime.        . FeFum-FePoly-FA-B Cmp-C-Biot (FOLIVANE-PLUS) CAPS Take 1 capsule by mouth daily.  90 capsule  1  . fexofenadine (ALLEGRA) 180 MG tablet Take 180 mg by mouth daily.        . folic acid (FOLVITE) 1 MG tablet Take 1 tablet (1 mg total) by mouth daily.  90 tablet  3  . HYDROcodone-acetaminophen (NORCO) 10-325 MG per tablet       . ibuprofen (ADVIL,MOTRIN) 200 MG tablet Take 200 mg by mouth every 6 (six) hours as needed.        . metoprolol-hydrochlorothiazide (LOPRESSOR HCT) 50-25 MG per tablet Take 1.5 tablets by mouth daily.      . Multiple Vitamin (MULTIVITAMIN) tablet Take 1 tablet by mouth daily.        Marland Kitchen omeprazole (PRILOSEC) 40 MG capsule Take 1 capsule (40 mg total)  by mouth daily.  90 capsule  1  . prochlorperazine (COMPAZINE) 10 MG tablet Take 10 mg by mouth every 6 (six) hours as needed.        Bernadette Hoit Sodium (SENOKOT S PO) Take by mouth. 3 tabs daily       . Tamsulosin HCl (FLOMAX) 0.4 MG CAPS Take 1 tablet by mouth daily.       No current facility-administered medications for this visit.   Facility-Administered Medications Ordered in Other Visits  Medication Dose Route Frequency Provider Last Rate Last Dose  . 0.9 %  sodium chloride infusion   Intravenous Once Mohamed K. Mohamed, MD      . bevacizumab (AVASTIN) 975 mg in sodium chloride 0.9 % 100 mL chemo infusion  15 mg/kg (Treatment  Plan Actual) Intravenous Once Mohamed K. Mohamed, MD   975 mg at 03/16/11 1304  . cyanocobalamin ((VITAMIN B-12)) injection 1,000 mcg  1,000 mcg Intramuscular Once Mohamed K. Mohamed, MD   1,000 mcg at 03/16/11 1145  . dexamethasone (DECADRON) injection 10 mg  10 mg Intravenous Once Mohamed K. Mohamed, MD   10 mg at 03/16/11 1140  . heparin lock flush 100 unit/mL  500 Units Intracatheter Once PRN Mohamed K. Mohamed, MD   500 Units at 03/16/11 1357  . ondansetron (ZOFRAN) IVPB 8 mg  8 mg Intravenous Once Mohamed K. Mohamed, MD   8 mg at 03/16/11 1140  . PEMEtrexed (ALIMTA) 875 mg in sodium chloride 0.9 % 100 mL chemo infusion  500 mg/m2 (Treatment Plan Actual) Intravenous Once Mohamed K. Mohamed, MD   875 mg at 03/16/11 1205  . sodium chloride 0.9 % injection 10 mL  10 mL Intracatheter PRN Mohamed K. Arbutus Ped, MD   10 mL at 03/16/11 1357    SURGICAL HISTORY:  Past Surgical History  Procedure Date  . 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 Rotator cuff repair     REVIEW OF SYSTEMS:  A comprehensive review of systems was negative.   PHYSICAL EXAMINATION: General appearance: alert, cooperative and no distress Head: Normocephalic, without obvious abnormality, atraumatic Neck: no adenopathy Lymph nodes: Cervical, supraclavicular, and axillary nodes normal. Resp: clear to auscultation bilaterally Cardio: regular rate and rhythm, S1, S2 normal, no murmur, click, rub or gallop GI: soft, non-tender; bowel sounds normal; no masses,  no organomegaly Extremities: extremities normal, atraumatic, no cyanosis or edema Neurologic: Alert and oriented X 3, normal strength and tone. Normal symmetric reflexes. Normal coordination and gait  ECOG PERFORMANCE STATUS: 0 - Asymptomatic  Blood pressure 131/83, pulse 72, temperature 97.1 F (36.2 C), temperature source Oral, height 5\' 7"  (1.702 m), weight 143 lb 6.4 oz (65.046 kg).  LABORATORY DATA: Lab Results  Component Value Date   WBC  4.8 03/16/2011   HGB 12.0* 03/16/2011   HCT 34.0* 03/16/2011   MCV 105.4* 03/16/2011   PLT 191 03/16/2011      Chemistry      Component Value Date/Time   NA 136 02/24/2011 1130   K 3.8 02/24/2011 1130   CL 102 02/24/2011 1130   CO2 26 02/24/2011 1130   BUN 21 02/24/2011 1130   CREATININE 1.20 02/24/2011 1130      Component Value Date/Time   CALCIUM 10.4 02/24/2011 1130   ALKPHOS 71 02/24/2011 1130   AST 33 02/24/2011 1130   ALT 28 02/24/2011 1130   BILITOT 0.6 02/24/2011 1130       RADIOGRAPHIC STUDIES: Ct Chest W Contrast  02/22/2011  *RADIOLOGY REPORT*  Clinical Data:  Restaging lung cancer status post completion of radiation therapy.  Chemotherapy in progress.  CT CHEST, ABDOMEN AND PELVIS WITH CONTRAST  Technique:  Multidetector CT imaging of the chest, abdomen and pelvis was performed following the standard protocol during bolus administration of intravenous contrast.  Contrast: OMNIPAQUE IOHEXOL 300 MG/ML IV SOLN  Comparison:  Prior examinations 12/18/2010 and 10/12/2010.  CT CHEST  Findings:  There are stable paramediastinal changes medially in the right lung.  No recurrent mass or endobronchial lesion is demonstrated.  There is no confluent airspace opacity.  A small amount of pleural thickening appears unchanged.  There is no pleural or pericardial effusion.  The mediastinum appears unchanged.  There are no enlarged mediastinal or hilar lymph nodes.  Mild atherosclerosis appears stable.  A right IJ Port-A-Cath extends to the SVC right atrial junction.  There are no suspicious osseous findings.  IMPRESSION: Stable chest CT with stable paramediastinal radiation changes.  No evidence of local recurrence or metastatic disease.  CT ABDOMEN AND PELVIS  Findings:  The liver, gallbladder, biliary system and pancreas remain unremarkable in appearance.  The spleen appears normal. There is no adrenal mass.  The kidneys are stable with a small cyst in the interpolar region of the  left kidney and scarring in its upper pole.  The right kidney appears normal.  There is stable perinephric and generalized retroperitoneal soft tissue stranding.  There is no ascites or omental nodularity. There is questionable mild diffuse colonic wall thickening without associated distension.  The small bowel appears normal.  There is stable diffuse aortic atherosclerosis with irregular intramural thrombus.  No large vessel occlusion is identified. There are no enlarged abdominal pelvic lymph nodes.  The urinary bladder, prostate gland and seminal vesicles appear unremarkable.  There are no acute or suspicious osseous findings.  IMPRESSION:  1.  Questionable mild diffuse colonic wall thickening may be infestation of incomplete distension.  If the patient has diarrhea, this could represent mild colitis. 2.  No evidence of abdominal pelvic metastatic disease. 3.  Stable left renal scarring and simple cyst. 4.  Stable diffuse atherosclerosis.  Original Report Authenticated By: Gerrianne Scale, M.D.   Ct Abdomen Pelvis W Contrast  02/22/2011  *RADIOLOGY REPORT*  Clinical Data:  Restaging lung cancer status post completion of radiation therapy.  Chemotherapy in progress.  CT CHEST, ABDOMEN AND PELVIS WITH CONTRAST  Technique:  Multidetector CT imaging of the chest, abdomen and pelvis was performed following the standard protocol during bolus administration of intravenous contrast.  Contrast: OMNIPAQUE IOHEXOL 300 MG/ML IV SOLN  Comparison:  Prior examinations 12/18/2010 and 10/12/2010.  CT CHEST  Findings:  There are stable paramediastinal changes medially in the right lung.  No recurrent mass or endobronchial lesion is demonstrated.  There is no confluent airspace opacity.  A small amount of pleural thickening appears unchanged.  There is no pleural or pericardial effusion.  The mediastinum appears unchanged.  There are no enlarged mediastinal or hilar lymph nodes.  Mild atherosclerosis appears stable.  A  right IJ Port-A-Cath extends to the SVC right atrial junction.  There are no suspicious osseous findings.  IMPRESSION: Stable chest CT with stable paramediastinal radiation changes.  No evidence of local recurrence or metastatic disease.  CT ABDOMEN AND PELVIS  Findings:  The liver, gallbladder, biliary system and pancreas remain unremarkable in appearance.  The spleen appears normal. There is no adrenal mass.  The kidneys are stable with a  small cyst in the interpolar region of the left kidney and scarring in its upper pole.  The right kidney appears normal.  There is stable perinephric and generalized retroperitoneal soft tissue stranding.  There is no ascites or omental nodularity. There is questionable mild diffuse colonic wall thickening without associated distension.  The small bowel appears normal.  There is stable diffuse aortic atherosclerosis with irregular intramural thrombus.  No large vessel occlusion is identified. There are no enlarged abdominal pelvic lymph nodes.  The urinary bladder, prostate gland and seminal vesicles appear unremarkable.  There are no acute or suspicious osseous findings.  IMPRESSION:  1.  Questionable mild diffuse colonic wall thickening may be infestation of incomplete distension.  If the patient has diarrhea, this could represent mild colitis. 2.  No evidence of abdominal pelvic metastatic disease. 3.  Stable left renal scarring and simple cyst. 4.  Stable diffuse atherosclerosis.  Original Report Authenticated By: Gerrianne Scale, M.D.    ASSESSMENT/PLAN: This is a very pleasant 65 years old white male with metastatic non-small cell lung cancer currently on maintenance treatment with Alimta and Avastin. The patient is tolerating his treatment fairly well. He has no evidence for disease progression on his recent scans. Patient was discussed with Dr. Gwenyth Bouillon and he will proceed with his next a scheduled cycle of maintenance chemotherapy with Alimta and Avastin as  scheduled today. He will followup in 3 weeks with repeat CBC differential C. met and urine protein dipstick prior to his next scheduled cycle of maintenance chemotherapy. As previously stated he will investigate what happened to the refill for his Renda Rolls and let us know.   All questions were answered. The patient knows to call the clinic with any problems, questions or concerns. We can certainly see the patient much sooner if necessary.    Conni Slipper, PA-C

## 2011-03-17 ENCOUNTER — Ambulatory Visit: Payer: Medicare Other

## 2011-03-17 ENCOUNTER — Other Ambulatory Visit: Payer: Self-pay | Admitting: *Deleted

## 2011-03-17 DIAGNOSIS — C3410 Malignant neoplasm of upper lobe, unspecified bronchus or lung: Secondary | ICD-10-CM

## 2011-03-17 MED ORDER — FOLIVANE-PLUS PO CAPS: 1.0000 | ORAL_CAPSULE | Freq: Every day | ORAL | Status: DC

## 2011-03-18 ENCOUNTER — Other Ambulatory Visit: Payer: Medicare Other | Admitting: Lab

## 2011-03-18 ENCOUNTER — Ambulatory Visit: Payer: Medicare Other | Admitting: Physician Assistant

## 2011-03-18 ENCOUNTER — Encounter (HOSPITAL_COMMUNITY)
Admission: RE | Admit: 2011-03-18 | Discharge: 2011-03-18 | Disposition: A | Discharge status: Home / Self Care | Payer: Medicare Other | Source: Ambulatory Visit | Attending: Pulmonary Disease | Admitting: Pulmonary Disease

## 2011-03-23 ENCOUNTER — Encounter (HOSPITAL_COMMUNITY): Payer: Medicare Other

## 2011-03-25 ENCOUNTER — Encounter (HOSPITAL_COMMUNITY): Payer: Medicare Other

## 2011-03-30 ENCOUNTER — Encounter (HOSPITAL_COMMUNITY): Payer: Medicare Other

## 2011-03-30 DIAGNOSIS — C3410 Malignant neoplasm of upper lobe, unspecified bronchus or lung: Secondary | ICD-10-CM | POA: Insufficient documentation

## 2011-03-30 DIAGNOSIS — R059 Cough, unspecified: Secondary | ICD-10-CM | POA: Insufficient documentation

## 2011-03-30 DIAGNOSIS — Z5189 Encounter for other specified aftercare: Secondary | ICD-10-CM | POA: Insufficient documentation

## 2011-03-30 DIAGNOSIS — R05 Cough: Secondary | ICD-10-CM | POA: Insufficient documentation

## 2011-03-30 DIAGNOSIS — J984 Other disorders of lung: Secondary | ICD-10-CM | POA: Insufficient documentation

## 2011-04-01 ENCOUNTER — Encounter (HOSPITAL_COMMUNITY)
Admission: RE | Admit: 2011-04-01 | Discharge: 2011-04-01 | Disposition: A | Discharge status: Home / Self Care | Payer: Medicare Other | Source: Ambulatory Visit | Attending: Pulmonary Disease | Admitting: Pulmonary Disease

## 2011-04-06 ENCOUNTER — Encounter (HOSPITAL_COMMUNITY): Admission: RE | Admit: 2011-04-06 | Payer: Medicare Other | Source: Ambulatory Visit

## 2011-04-07 ENCOUNTER — Other Ambulatory Visit: Payer: Medicare Other | Admitting: Lab

## 2011-04-08 ENCOUNTER — Encounter (HOSPITAL_COMMUNITY): Payer: Medicare Other

## 2011-04-12 ENCOUNTER — Telehealth: Payer: Self-pay | Admitting: *Deleted

## 2011-04-12 ENCOUNTER — Other Ambulatory Visit: Payer: Self-pay | Admitting: Internal Medicine

## 2011-04-12 NOTE — Telephone Encounter (Signed)
Pt called wanting advice about his cold and what to take for a decongestant.  Pt stated he went to PCP and they gave him and abx.  Per Dr Donnald Garre okay to use claritan as well. Pt verbalized understanding  SLJ

## 2011-04-13 ENCOUNTER — Encounter (HOSPITAL_COMMUNITY): Payer: Medicare Other

## 2011-04-13 ENCOUNTER — Ambulatory Visit (HOSPITAL_COMMUNITY): Payer: Medicare Other

## 2011-04-14 ENCOUNTER — Ambulatory Visit (HOSPITAL_BASED_OUTPATIENT_CLINIC_OR_DEPARTMENT_OTHER): Payer: Medicare Other

## 2011-04-14 ENCOUNTER — Other Ambulatory Visit (HOSPITAL_BASED_OUTPATIENT_CLINIC_OR_DEPARTMENT_OTHER): Payer: Medicare Other | Admitting: Lab

## 2011-04-14 ENCOUNTER — Telehealth: Payer: Self-pay | Admitting: Internal Medicine

## 2011-04-14 ENCOUNTER — Ambulatory Visit: Payer: Medicare Other | Admitting: Internal Medicine

## 2011-04-14 ENCOUNTER — Other Ambulatory Visit: Payer: Self-pay | Admitting: Internal Medicine

## 2011-04-14 VITALS — BP 149/93 | HR 64 | Temp 96.9°F | Ht 67.0 in | Wt 144.4 lb

## 2011-04-14 DIAGNOSIS — Z5111 Encounter for antineoplastic chemotherapy: Secondary | ICD-10-CM

## 2011-04-14 DIAGNOSIS — C3410 Malignant neoplasm of upper lobe, unspecified bronchus or lung: Secondary | ICD-10-CM

## 2011-04-14 DIAGNOSIS — C3490 Malignant neoplasm of unspecified part of unspecified bronchus or lung: Secondary | ICD-10-CM

## 2011-04-14 DIAGNOSIS — R0989 Other specified symptoms and signs involving the circulatory and respiratory systems: Secondary | ICD-10-CM

## 2011-04-14 DIAGNOSIS — J029 Acute pharyngitis, unspecified: Secondary | ICD-10-CM

## 2011-04-14 LAB — COMPREHENSIVE METABOLIC PANEL
AST: 30 U/L (ref 0–37)
Alkaline Phosphatase: 57 U/L (ref 39–117)
BUN: 26 mg/dL — ABNORMAL HIGH (ref 6–23)
Creatinine, Ser: 1.38 mg/dL — ABNORMAL HIGH (ref 0.50–1.35)
Potassium: 3.7 mEq/L (ref 3.5–5.3)
Total Bilirubin: 0.5 mg/dL (ref 0.3–1.2)

## 2011-04-14 LAB — CBC WITH DIFFERENTIAL/PLATELET
BASO%: 0.2 % (ref 0.0–2.0)
EOS%: 0 % (ref 0.0–7.0)
HCT: 36.6 % — ABNORMAL LOW (ref 38.4–49.9)
LYMPH%: 10.1 % — ABNORMAL LOW (ref 14.0–49.0)
MCH: 35.2 pg — ABNORMAL HIGH (ref 27.2–33.4)
MCHC: 34.2 g/dL (ref 32.0–36.0)
NEUT%: 71.2 % (ref 39.0–75.0)
lymph#: 0.5 10*3/uL — ABNORMAL LOW (ref 0.9–3.3)

## 2011-04-14 LAB — UA PROTEIN, DIPSTICK - CHCC: Protein, Urine: 30 mg/dL

## 2011-04-14 MED ORDER — SODIUM CHLORIDE 0.9 % IV SOLN
500.0000 mg/m2 | Freq: Once | INTRAVENOUS | Status: AC
  Administered 2011-04-14: 875 mg via INTRAVENOUS
  Filled 2011-04-14: qty 35

## 2011-04-14 MED ORDER — SODIUM CHLORIDE 0.9 % IJ SOLN
10.0000 mL | INTRAMUSCULAR | Status: DC | PRN
Start: 2011-04-14 — End: 2011-04-14
  Administered 2011-04-14: 10 mL
  Filled 2011-04-14: qty 10

## 2011-04-14 MED ORDER — HEPARIN SOD (PORK) LOCK FLUSH 100 UNIT/ML IV SOLN
500.0000 [IU] | Freq: Once | INTRAVENOUS | Status: AC | PRN
  Administered 2011-04-14: 500 [IU]
  Filled 2011-04-14: qty 5

## 2011-04-14 MED ORDER — SODIUM CHLORIDE 0.9 % IV SOLN
15.0000 mg/kg | Freq: Once | INTRAVENOUS | Status: AC
  Administered 2011-04-14: 975 mg via INTRAVENOUS
  Filled 2011-04-14: qty 39

## 2011-04-14 MED ORDER — SODIUM CHLORIDE 0.9 % IV SOLN
Freq: Once | INTRAVENOUS | Status: AC
  Administered 2011-04-14: 13:00:00 via INTRAVENOUS

## 2011-04-14 MED ORDER — ONDANSETRON 8 MG/50ML IVPB (CHCC)
8.0000 mg | Freq: Once | INTRAVENOUS | Status: AC
  Administered 2011-04-14: 8 mg via INTRAVENOUS

## 2011-04-14 MED ORDER — DEXAMETHASONE SODIUM PHOSPHATE 10 MG/ML IJ SOLN
10.0000 mg | Freq: Once | INTRAMUSCULAR | Status: AC
  Administered 2011-04-14: 10 mg via INTRAVENOUS

## 2011-04-14 NOTE — Progress Notes (Signed)
[facility] OFFICE PROGRESS NOTE  Cynda Familia, MD, MD [address] [place] [address]  DIAGNOSIS: Metastatic non-small cell lung cancer adenocarcinoma diagnosed in September 2010   PRIOR THERAPY:  #1 status post 6 cycles of systemic chemotherapy with carboplatin, Alimta and Avastin given every 3 weeks last dose given 04/23/2009 with disease stabilization.  #2 status post palliative radiotherapy to the mediastinum under the care of Dr. Michell Heinrich. The patient received a total dose of 3000 cGY and 12 fractions completed 01/21/2009.   CURRENT THERAPY: Maintenance chemotherapy with Alimta at 500 mg per meter squared and Avastin at 15 mg per kilogram given every 3 weeks status post 32 cycles.   INTERVAL HISTORY: [name] 66 y.o. male returns to the clinic today for followup visit. The patient came back from his trip to [place] with sore throat. He was seen by his primary care physician on Monday and was started on amoxicillin. He is feeling fine he denied having any specific complaints today. He has no fever or chills, no chest pain or shortness breath, no cough or hemoptysis. No nausea or vomiting. He is to start the next cycle of his chemotherapy  MEDICAL HISTORY: Past Medical History  Diagnosis Date  . Hypertension   . Arthritis   . Lung cancer     lung ca dx 11/2008  . Syncope and collapse     ALLERGIES:  is allergic to percocet and zolpidem tartrate.  MEDICATIONS:  Current Outpatient Prescriptions  Medication Sig Dispense Refill  . amLODipine (NORVASC) 2.5 MG tablet Take 2.5 mg by mouth daily.        Marland Kitchen atenolol-chlorthalidone (TENORETIC) 50-25 MG per tablet       . B Complex-Biotin-FA (B-COMPLEX PO) Take by mouth. 1500mg  daily       . dexamethasone (DECADRON) 4 MG tablet Take 4 mg by mouth. 2 days before, day of, and after chemotherapy       . doxazosin (CARDURA) 2 MG tablet Take 2 mg by mouth at bedtime.        . FeFum-FePoly-FA-B Cmp-C-Biot  (FOLIVANE-PLUS) CAPS Take 1 capsule by mouth daily.  90 capsule  1  . fexofenadine (ALLEGRA) 180 MG tablet Take 180 mg by mouth daily.        . folic acid (FOLVITE) 1 MG tablet Take 1 tablet (1 mg total) by mouth daily.  90 tablet  3  . HYDROcodone-acetaminophen (NORCO) 10-325 MG per tablet       . ibuprofen (ADVIL,MOTRIN) 200 MG tablet Take 200 mg by mouth every 6 (six) hours as needed.        . metoprolol-hydrochlorothiazide (LOPRESSOR HCT) 50-25 MG per tablet Take 1.5 tablets by mouth daily.      . Multiple Vitamin (MULTIVITAMIN) tablet Take 1 tablet by mouth daily.        Marland Kitchen omeprazole (PRILOSEC) 40 MG capsule Take 1 capsule (40 mg total) by mouth daily.  90 capsule  1  . prochlorperazine (COMPAZINE) 10 MG tablet Take 10 mg by mouth every 6 (six) hours as needed.        Bernadette Hoit Sodium (SENOKOT S PO) Take by mouth. 3 tabs daily       . Tamsulosin HCl (FLOMAX) 0.4 MG CAPS Take 1 tablet by mouth daily.        SURGICAL HISTORY:  Past Surgical History  Procedure Date  . 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 Rotator cuff repair  REVIEW OF SYSTEMS:  A comprehensive review of systems was negative except for: Constitutional: positive for fatigue Ears, nose, mouth, throat, and face: positive for sore throat   PHYSICAL EXAMINATION: General appearance: alert, cooperative and no distress Head: Normocephalic, without obvious abnormality, atraumatic Neck: no adenopathy Resp: clear to auscultation bilaterally Cardio: regular rate and rhythm, S1, S2 normal, no murmur, click, rub or gallop GI: soft, non-tender; bowel sounds normal; no masses,  no organomegaly Extremities: extremities normal, atraumatic, no cyanosis or edema  ECOG PERFORMANCE STATUS: 1 - Symptomatic but completely ambulatory  Blood pressure 149/93, pulse 64, temperature 96.9 F (36.1 C), temperature source Oral, height 5\' 7"  (1.702 m), weight 144 lb 6.4 oz (65.499 kg).  LABORATORY DATA: Lab  Results  Component Value Date   WBC 5.0 04/14/2011   HGB 12.5* 04/14/2011   HCT 36.6* 04/14/2011   MCV 103.1* 04/14/2011   PLT 143 04/14/2011      Chemistry      Component Value Date/Time   NA 133* 03/16/2011 1000   K 3.7 03/16/2011 1000   CL 102 03/16/2011 1000   CO2 29 03/16/2011 1000   BUN 20 03/16/2011 1000   CREATININE 1.17 03/16/2011 1000      Component Value Date/Time   CALCIUM 9.1 03/16/2011 1000   ALKPHOS 65 03/16/2011 1000   AST 33 03/16/2011 1000   ALT <8 03/16/2011 1000   BILITOT <0.1* 03/16/2011 1000       RADIOGRAPHIC STUDIES: No results found.  ASSESSMENT: This is a very pleasant 66 years old white male with metastatic non-small cell lung cancer, adenocarcinoma. He is currently on maintenance chemotherapy with Alimta status post 32 cycles. Patient is doing fine except for mild chest congestion and sore throat and he is currently on treatment with amoxicillin. He is feeling fine and ready to proceed with his systemic chemotherapy.  PLAN: He'll start cycle #33 today. The patient would come back for followup visit in 3 weeks with repeat CT scan of the chest, abdomen and pelvis for restaging of his disease. He was advised to call me immediately  If he has any concerning symptoms in the interval.   All questions were answered. The patient knows to call the clinic with any problems, questions or concerns. We can certainly see the patient much sooner if necessary.

## 2011-04-14 NOTE — Telephone Encounter (Signed)
appt made for 2/1 ct and 2/6 md visit  Printed for pt  aom

## 2011-04-14 NOTE — Patient Instructions (Signed)
1450 Pt ambulatory upon discharge and verbalized understanding of next appt date.

## 2011-04-15 ENCOUNTER — Ambulatory Visit (HOSPITAL_COMMUNITY): Payer: Medicare Other

## 2011-04-15 ENCOUNTER — Encounter (HOSPITAL_COMMUNITY): Payer: Medicare Other

## 2011-04-20 ENCOUNTER — Encounter (HOSPITAL_COMMUNITY): Payer: Medicare Other

## 2011-04-21 ENCOUNTER — Telehealth (HOSPITAL_COMMUNITY): Payer: Self-pay | Admitting: *Deleted

## 2011-04-22 ENCOUNTER — Encounter (HOSPITAL_COMMUNITY): Admission: RE | Admit: 2011-04-22 | Discharge: 2011-04-22 | Payer: Medicare Other | Source: Ambulatory Visit

## 2011-04-22 NOTE — Progress Notes (Signed)
Pulmonary Rehab Discharge   Patient did well exercising in the rehab program. Pt maintained oxygen sats above or equal to 90% RA. Unable to come back to finish final documentation and final walk test. Pleasant patient to work with and if needs to come to rehab, he is more than welcome to.

## 2011-04-27 ENCOUNTER — Encounter (HOSPITAL_COMMUNITY): Payer: Medicare Other

## 2011-04-28 ENCOUNTER — Other Ambulatory Visit: Payer: Medicare Other | Admitting: Lab

## 2011-04-29 ENCOUNTER — Encounter (HOSPITAL_COMMUNITY): Payer: Medicare Other

## 2011-04-30 ENCOUNTER — Ambulatory Visit (HOSPITAL_COMMUNITY)
Admission: RE | Admit: 2011-04-30 | Discharge: 2011-04-30 | Disposition: A | Discharge status: Home / Self Care | Payer: Medicare Other | Source: Ambulatory Visit | Attending: Internal Medicine | Admitting: Internal Medicine

## 2011-04-30 DIAGNOSIS — C3490 Malignant neoplasm of unspecified part of unspecified bronchus or lung: Secondary | ICD-10-CM | POA: Insufficient documentation

## 2011-04-30 DIAGNOSIS — N289 Disorder of kidney and ureter, unspecified: Secondary | ICD-10-CM | POA: Insufficient documentation

## 2011-04-30 MED ORDER — IOHEXOL 300 MG/ML  SOLN
100.0000 mL | Freq: Once | INTRAMUSCULAR | Status: AC | PRN
  Administered 2011-04-30: 100 mL via INTRAVENOUS

## 2011-05-04 ENCOUNTER — Encounter (HOSPITAL_COMMUNITY): Payer: Medicare Other

## 2011-05-05 ENCOUNTER — Telehealth: Payer: Self-pay | Admitting: Internal Medicine

## 2011-05-05 ENCOUNTER — Ambulatory Visit (HOSPITAL_BASED_OUTPATIENT_CLINIC_OR_DEPARTMENT_OTHER): Payer: Medicare Other | Admitting: Internal Medicine

## 2011-05-05 ENCOUNTER — Other Ambulatory Visit: Payer: Medicare Other | Admitting: Lab

## 2011-05-05 ENCOUNTER — Other Ambulatory Visit: Payer: Self-pay | Admitting: Internal Medicine

## 2011-05-05 ENCOUNTER — Ambulatory Visit: Payer: Medicare Other

## 2011-05-05 DIAGNOSIS — R0989 Other specified symptoms and signs involving the circulatory and respiratory systems: Secondary | ICD-10-CM

## 2011-05-05 DIAGNOSIS — C3490 Malignant neoplasm of unspecified part of unspecified bronchus or lung: Secondary | ICD-10-CM

## 2011-05-05 DIAGNOSIS — C3410 Malignant neoplasm of upper lobe, unspecified bronchus or lung: Secondary | ICD-10-CM

## 2011-05-05 DIAGNOSIS — R0600 Dyspnea, unspecified: Secondary | ICD-10-CM

## 2011-05-05 LAB — CBC WITH DIFFERENTIAL/PLATELET
BASO%: 0.2 % (ref 0.0–2.0)
HCT: 29.4 % — ABNORMAL LOW (ref 38.4–49.9)
MCHC: 34 g/dL (ref 32.0–36.0)
MONO#: 0.3 10*3/uL (ref 0.1–0.9)
RBC: 2.9 10*6/uL — ABNORMAL LOW (ref 4.20–5.82)
WBC: 5 10*3/uL (ref 4.0–10.3)
lymph#: 0.5 10*3/uL — ABNORMAL LOW (ref 0.9–3.3)
nRBC: 0 % (ref 0–0)

## 2011-05-05 LAB — UA PROTEIN, DIPSTICK - CHCC: Protein, Urine: <30 mg/dL

## 2011-05-05 MED ORDER — HYDROCODONE-HOMATROPINE 5-1.5 MG/5ML PO SYRP: ORAL_SOLUTION | ORAL | Status: DC

## 2011-05-05 NOTE — Progress Notes (Signed)
[facility] OFFICE PROGRESS NOTE  Cynda Familia, MD, MD [address] [place] [address]  DIAGNOSIS: Metastatic non-small cell lung cancer adenocarcinoma diagnosed in September 2010   PRIOR THERAPY:  #1 status post 6 cycles of systemic chemotherapy with carboplatin, Alimta and Avastin given every 3 weeks last dose given 04/23/2009 with disease stabilization.  #2 status post palliative radiotherapy to the mediastinum under the care of Dr. Michell Heinrich. The patient received a total dose of 3000 cGY and 12 fractions completed 01/21/2009.   CURRENT THERAPY: Maintenance chemotherapy with Alimta at 500 mg per meter squared and Avastin at 15 mg per kilogram given every 3 weeks status post 33 cycles.   INTERVAL HISTORY: [name] 66 y.o. male returns to the clinic today for followup visit accompanied by his wife. The patient is feeling fine today except for flulike symptoms and bronchitis. He was treated with a course of amoxicillin by his primary care physician almost 2 months ago but no significant improvement in his condition. He continues to have cough with significant fatigue. He has no chest pain but continues to have shortness of breath with exertion. No headache or blurry vision. The patient has repeat CT scan of the chest, abdomen and pelvis performed recently and he is here today for evaluation and discussion of his scan results.  MEDICAL HISTORY: Past Medical History  Diagnosis Date  . Hypertension   . Arthritis   . Lung cancer     lung ca dx 11/2008  . Syncope and collapse     ALLERGIES:  is allergic to percocet and zolpidem tartrate.  MEDICATIONS:  Current Outpatient Prescriptions  Medication Sig Dispense Refill  . amLODipine (NORVASC) 2.5 MG tablet Take 2.5 mg by mouth daily.        Marland Kitchen atenolol-chlorthalidone (TENORETIC) 50-25 MG per tablet       . B Complex-Biotin-FA (B-COMPLEX PO) Take by mouth. 1500mg  daily       . dexamethasone (DECADRON)  4 MG tablet Take 4 mg by mouth. 2 days before, day of, and after chemotherapy       . doxazosin (CARDURA) 2 MG tablet Take 2 mg by mouth at bedtime.        . FeFum-FePoly-FA-B Cmp-C-Biot (FOLIVANE-PLUS) CAPS Take 1 capsule by mouth daily.  90 capsule  1  . fexofenadine (ALLEGRA) 180 MG tablet Take 180 mg by mouth daily.        . folic acid (FOLVITE) 1 MG tablet Take 1 tablet (1 mg total) by mouth daily.  90 tablet  3  . HYDROcodone-acetaminophen (NORCO) 10-325 MG per tablet       . ibuprofen (ADVIL,MOTRIN) 200 MG tablet Take 200 mg by mouth every 6 (six) hours as needed.        . metoprolol-hydrochlorothiazide (LOPRESSOR HCT) 50-25 MG per tablet Take 1.5 tablets by mouth daily.      . Multiple Vitamin (MULTIVITAMIN) tablet Take 1 tablet by mouth daily.        Marland Kitchen omeprazole (PRILOSEC) 40 MG capsule Take 1 capsule (40 mg total) by mouth daily.  90 capsule  1  . prochlorperazine (COMPAZINE) 10 MG tablet Take 10 mg by mouth every 6 (six) hours as needed.        Bernadette Hoit Sodium (SENOKOT S PO) Take by mouth. 3 tabs daily       . Tamsulosin HCl (FLOMAX) 0.4 MG CAPS Take 1 tablet by mouth daily.  SURGICAL HISTORY:  Past Surgical History  Procedure Date  . 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 Rotator cuff repair     REVIEW OF SYSTEMS:  A comprehensive review of systems was negative except for: Respiratory: positive for chronic bronchitis and cough   PHYSICAL EXAMINATION: General appearance: alert, cooperative and no distress Head: Normocephalic, without obvious abnormality, atraumatic Neck: no adenopathy Lymph nodes: Cervical, supraclavicular, and axillary nodes normal. Resp: clear to auscultation bilaterally Cardio: regular rate and rhythm, S1, S2 normal, no murmur, click, rub or gallop GI: soft, non-tender; bowel sounds normal; no masses,  no organomegaly Extremities: extremities normal, atraumatic, no cyanosis or edema Neurologic: Alert and oriented X  3, normal strength and tone. Normal symmetric reflexes. Normal coordination and gait  ECOG PERFORMANCE STATUS: 1 - Symptomatic but completely ambulatory  There were no vitals taken for this visit.  LABORATORY DATA: Lab Results  Component Value Date   WBC 5.0 05/05/2011   HGB 10.0* 05/05/2011   HCT 29.4* 05/05/2011   MCV 101.4* 05/05/2011   PLT 248 05/05/2011      Chemistry      Component Value Date/Time   NA 137 04/14/2011 1117   K 3.7 04/14/2011 1117   CL 100 04/14/2011 1117   CO2 23 04/14/2011 1117   BUN 26* 04/14/2011 1117   CREATININE 1.38* 04/14/2011 1117      Component Value Date/Time   CALCIUM 9.7 04/14/2011 1117   ALKPHOS 57 04/14/2011 1117   AST 30 04/14/2011 1117   ALT 23 04/14/2011 1117   BILITOT 0.5 04/14/2011 1117       RADIOGRAPHIC STUDIES: Ct Chest W Contrast  04/30/2011  *RADIOLOGY REPORT*  Clinical Data:  Metastatic non-small cell lung cancer diagnosed September 2010.  Patient is status post six cycles chemo therapy completed and January 2011 and radiotherapy completed in 2010. The patient currently on maintenance chemotherapy.   CT CHEST, ABDOMEN AND PELVIS WITH CONTRAST  Technique:  Multidetector CT imaging of the chest, abdomen and pelvis was performed following the standard protocol during bolus administration of intravenous contrast.  Contrast: OMNIPAQUE IOHEXOL 300 MG/ML IV SOLN  Comparison:  CT 02/22/2011  CT CHEST  Findings:  Port right anterior chest wall.  No axillary or supraclavicular lymphadenopathy.  No mediastinal lymphadenopathy. No pericardial fluid.  Esophagus is normal.  Review of the lung windows demonstrates bilateral lower lobe ground- glass nodules of varying sizes which are new from prior.  These nodules number approximately 10-20 in the lower lobes and range in size from 5 mm.  There are similar nodules in the inferior aspect of the right middle lobe and lingula.  The more superior upper lobes are spared. These lesions were not present on comparison CT  from 02/22/2011.  There is paramediastinal fibrotic thickening consistent radiation change in the right upper lobe and a stable pleural based nodular thickening in the left upper lobe (image 11).  IMPRESSION:  1.  Multiple new ground-glass nodules in the lower lobes. The rapid onset of these lesions  suggest infectious or inflammatory etiology.  Cannot exclude multifocal adenocarcinoma although this is felt less likely.  Recommend short-term follow-up CT for reevaluation.  2.  Stable radiation change in the right upper lobe.   CT ABDOMEN AND PELVIS  Findings:  No focal hepatic lesion.  The gallbladder, pancreas, spleen, adrenal glands, and kidneys are unchanged.  There is a low density lesion in the left kidney likely representing cyst.  The  stomach, small bowel, and colon are normal.  Abdominal aorta is normal caliber. There is atherosclerotic change. No retroperitoneal lymphadenopathy.  No free fluid the pelvis.  Bladder prostate gland are normal.  No pelvic lymphadenopathy. Review of  bone windows demonstrates no aggressive osseous lesions.  IMPRESSION:  1.  No evidence of  metastasis within the abdomen or  pelvis. 2.  Atherosclerotic change in the aorta.  Original Report Authenticated By: Genevive Bi, M.D.    ASSESSMENT:  #1 metastatic non-small cell lung cancer, adenocarcinoma: He is currently on maintenance chemotherapy with Alimta and Avastin is status post 33 cycles. No evidence for disease progression on recent scan. 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 There is no significant evidence for disease progression but questionable inflammatory process in the lung. I will delay the next cycle of his chemotherapy for 2 weeks until his general condition improves. He is expected to start cycle #34 on 05/19/2011. #2 for the inflammatory lung disease, I recommended for the patient to see Dr. Vassie Loll  for evaluation in the next few days. I also gave the patient the option of treatment with Avelox 400 mg by mouth daily if he can't see Dr. Vassie Loll in the next few days. The patient will come back for followup visit in 2 weeks. She was advised to call me immediately if he has any concerning symptoms in the interval.    All questions were answered. The patient knows to call the clinic with any problems, questions or concerns. We can certainly see the patient much sooner if necessary.  I spent 20 minutes counseling the patient face to face. The total time spent in the appointment was 40 minutes.

## 2011-05-05 NOTE — Telephone Encounter (Signed)
gv pt appt for [id number].  scheduled appt for Dr. Vassie Loll for 05/06/2011

## 2011-05-06 ENCOUNTER — Encounter: Payer: Self-pay | Admitting: Pulmonary Disease

## 2011-05-06 ENCOUNTER — Ambulatory Visit (INDEPENDENT_AMBULATORY_CARE_PROVIDER_SITE_OTHER): Payer: Medicare Other | Admitting: Pulmonary Disease

## 2011-05-06 ENCOUNTER — Encounter (HOSPITAL_COMMUNITY): Payer: Medicare Other

## 2011-05-06 VITALS — BP 120/82 | HR 72 | Temp 97.9°F | Ht 68.0 in | Wt 137.8 lb

## 2011-05-06 DIAGNOSIS — R918 Other nonspecific abnormal finding of lung field: Secondary | ICD-10-CM

## 2011-05-06 MED ORDER — MOXIFLOXACIN HCL 400 MG PO TABS: 400.0000 mg | ORAL_TABLET | Freq: Every day | ORAL | Status: DC

## 2011-05-06 MED ORDER — PREDNISONE 10 MG PO TABS: ORAL_TABLET | ORAL | Status: DC

## 2011-05-06 NOTE — Progress Notes (Signed)
  Subjective:    Patient ID: Jeanpierre Thebeau, male    DOB: 06-04-1945, 66 y.o.   MRN: [id number]  HPI 65/M with metastatic adenoCA diagnosed in sep '10 s/p chemo  & RT to mediastinum last 10/10  He is on maintenance chemo alimta & avastin for evaluation of pulmonary infx  He reports DOE x 1 yr , progressive, eg on climbing stairs, cannot swing a golf club  C/o occ sinus congestion, no heartburn on omeprazole, no wheeze  Spirometry showed mild obstruction but preserved FEv1 at 97%   05/06/2011 4 mnth FU  CT chest 2/13 showed Multiple new ground-glass nodules in the lower lobes - not seen in 11/12. The rapid onset of these lesions suggest infectious or inflammatory etiology. Cannot exclude multifocal adenocarcinoma although this is felt less likely. Stable radiation change in the right upper lobe.  He opted out of pulm rehab Dyspnea persists    Review of Systems c/o cough relieved by hycodan Patient denies significant dyspnea, hemoptysis,  chest pain, palpitations, pedal edema, orthopnea, paroxysmal nocturnal dyspnea, lightheadedness, nausea, vomiting, abdominal or  leg pains      Objective:   Physical Exam  Gen. Pleasant, well-nourished, in no distress, normal affect ENT - no lesions, no post nasal drip Neck: No JVD, no thyromegaly, no carotid bruits Lungs: no use of accessory muscles, no dullness to percussion, clear without rales or rhonchi  Cardiovascular: Rhythm regular, heart sounds  normal, no murmurs or gallops, no peripheral edema Abdomen: soft and non-tender, no hepatosplenomegaly, BS normal. Musculoskeletal: No deformities, no cyanosis or clubbing Neuro:  alert, non focal       Assessment & Plan:

## 2011-05-06 NOTE — Assessment & Plan Note (Signed)
Unclear cause - favor infectious vs inflammatory given acute appearance within 2 months Avelox x 7 days Prednisone 40 mg daily x 7 days,  Then 30 mg x 7ds, then 20 mg x 7ds If no improvement in 2 weeks will proceed with bronchoscopy  Would suggest we hold off chemotherapy until these infiltrates are clarified.

## 2011-05-06 NOTE — Progress Notes (Signed)
Addended by: Julaine Hua on: 05/06/2011 04:06 PM   Modules accepted: Orders

## 2011-05-06 NOTE — Patient Instructions (Signed)
Avelox x 7 days Prednisone 40 mg daily x 7 days,  Then 30 mg x 7ds, then 20 mg x 7ds If no improvement in 2 weeks will proceed with bronchoscopy

## 2011-05-11 ENCOUNTER — Encounter (HOSPITAL_COMMUNITY): Payer: Medicare Other

## 2011-05-13 ENCOUNTER — Telehealth: Payer: Self-pay | Admitting: Pulmonary Disease

## 2011-05-13 ENCOUNTER — Encounter (HOSPITAL_COMMUNITY): Payer: Medicare Other

## 2011-05-13 DIAGNOSIS — R918 Other nonspecific abnormal finding of lung field: Secondary | ICD-10-CM

## 2011-05-13 MED ORDER — MOXIFLOXACIN HCL 400 MG PO TABS: 400.0000 mg | ORAL_TABLET | Freq: Every day | ORAL | Status: DC

## 2011-05-13 NOTE — Telephone Encounter (Signed)
Called, spoke with pt.    Pt states he is slightly better but "still feels pretty crappy from time to time."  States he is still having chest congestion, nonprod cough, chest tightness from time to time, and feels lethargic.  He believes these symptoms have improved some since last OV with RA.  He finished avelox yesterday.    He is scheduled to see Dr. Arville Go on Thursday, Feb 21.  He states RA had mentioned having a CXR done in 2 wks from last OV, which would be the time he is scheduled to see Mohommed.  He thinks this should be done prior to oncology appt and would like to know RA's thoughts on this.  He would also like to know if RA would like to extend the avelox.  Dr. Vassie Loll, please advise.  Thanks!

## 2011-05-13 NOTE — Telephone Encounter (Signed)
I spoke with Dr. Vassie Loll regarding this.   Per Dr. Vassie Loll, extend Avelox 400 mg qd for 5 more days.  Also, have pt come in on Feb 19 with CXR prior.     I have sent the Avelox rx to [facility] and scheduled OV on Feb 19 at 3pm with Dr. Vassie Loll -- pt aware of this and aware he will need to come in for CXR prior to OV.  He verbalized understanding and nothing further was needed at this time.  CXR order placed as well.

## 2011-05-18 ENCOUNTER — Ambulatory Visit (INDEPENDENT_AMBULATORY_CARE_PROVIDER_SITE_OTHER): Payer: Medicare Other | Admitting: Pulmonary Disease

## 2011-05-18 ENCOUNTER — Encounter: Payer: Self-pay | Admitting: Pulmonary Disease

## 2011-05-18 ENCOUNTER — Ambulatory Visit (INDEPENDENT_AMBULATORY_CARE_PROVIDER_SITE_OTHER)
Admission: RE | Admit: 2011-05-18 | Discharge: 2011-05-18 | Disposition: A | Discharge status: Home / Self Care | Payer: Medicare Other | Source: Ambulatory Visit | Attending: Pulmonary Disease | Admitting: Pulmonary Disease

## 2011-05-18 ENCOUNTER — Encounter (HOSPITAL_COMMUNITY): Payer: Medicare Other

## 2011-05-18 VITALS — BP 142/90 | HR 75 | Temp 97.6°F | Ht 68.0 in | Wt 136.2 lb

## 2011-05-18 DIAGNOSIS — R918 Other nonspecific abnormal finding of lung field: Secondary | ICD-10-CM

## 2011-05-18 MED ORDER — PREDNISONE 10 MG PO TABS: ORAL_TABLET | ORAL | Status: DC

## 2011-05-18 NOTE — Patient Instructions (Addendum)
Xray looks better Drop Prednisone to 10 mg on march 15 Schedule PFTS next visit OK to resume chemoRx OK to not take prednisone -days you take dexamethasone

## 2011-05-18 NOTE — Progress Notes (Signed)
  Subjective:    Patient ID: [name], male    DOB: 11/09/1945, 66 y.o.   MRN: [id number]  HPI 65/M, ex smoker with metastatic adenoCA diagnosed in sep '10 s/p chemo & RT to mediastinum last 10/10  He is on maintenance chemo alimta & avastin  He reports DOE x 1 yr , progressive, eg on climbing stairs, cannot swing a golf club  C/o occ sinus congestion, no heartburn on omeprazole, no wheeze  Spirometry showed mild obstruction but preserved FEv1 at 97%   05/06/2011 4 mnth FU for evaluation of pulmonary infx  CT chest 2/13 showed Multiple new ground-glass nodules in the lower lobes - not seen in 11/12. The rapid onset of these lesions suggest infectious or inflammatory etiology. Cannot exclude multifocal adenocarcinoma although this is felt less likely. Stable radiation change in the right upper lobe.  >> avelox & prednisone  05/18/2011  pt states cough is better, pt c/o feeling chest pressure when changing positions from laying down to sitting up and reverse. Is currently taking Prednisone 30mg  daily Foot Burn c/o tingling, cold, burning sensation in feet at night time x 1 week  CXR improved BL infiltrates He opted out of pulm rehab  Dyspnea persists  Review of Systems Patient denies significant dyspnea,cough, hemoptysis,  chest pain, palpitations, pedal edema, orthopnea, paroxysmal nocturnal dyspnea, lightheadedness, nausea, vomiting, abdominal or  leg pains      Objective:   Physical Exam  Gen. Pleasant, well-nourished, in no distress ENT - no lesions, no post nasal drip Neck: No JVD, no thyromegaly, no carotid bruits Lungs: no use of accessory muscles, no dullness to percussion, clear without rales or rhonchi  Cardiovascular: Rhythm regular, heart sounds  normal, no murmurs or gallops, no peripheral edema Musculoskeletal: No deformities, no cyanosis or clubbing        Assessment & Plan:

## 2011-05-19 ENCOUNTER — Ambulatory Visit: Payer: Medicare Other

## 2011-05-19 ENCOUNTER — Ambulatory Visit (HOSPITAL_BASED_OUTPATIENT_CLINIC_OR_DEPARTMENT_OTHER): Payer: Medicare Other

## 2011-05-19 ENCOUNTER — Ambulatory Visit (HOSPITAL_BASED_OUTPATIENT_CLINIC_OR_DEPARTMENT_OTHER): Payer: Medicare Other | Admitting: Internal Medicine

## 2011-05-19 ENCOUNTER — Other Ambulatory Visit: Payer: Medicare Other

## 2011-05-19 VITALS — BP 155/87 | HR 66 | Temp 94.6°F | Ht 68.0 in | Wt 134.4 lb

## 2011-05-19 DIAGNOSIS — C3490 Malignant neoplasm of unspecified part of unspecified bronchus or lung: Secondary | ICD-10-CM

## 2011-05-19 DIAGNOSIS — Z5111 Encounter for antineoplastic chemotherapy: Secondary | ICD-10-CM

## 2011-05-19 DIAGNOSIS — C3410 Malignant neoplasm of upper lobe, unspecified bronchus or lung: Secondary | ICD-10-CM

## 2011-05-19 LAB — CBC WITH DIFFERENTIAL/PLATELET
BASO%: 0.2 % (ref 0.0–2.0)
Eosinophils Absolute: 0 10*3/uL (ref 0.0–0.5)
MCHC: 34.6 g/dL (ref 32.0–36.0)
MONO#: 0.7 10*3/uL (ref 0.1–0.9)
NEUT#: 23 10*3/uL — ABNORMAL HIGH (ref 1.5–6.5)
RBC: 3.7 10*6/uL — ABNORMAL LOW (ref 4.20–5.82)
RDW: 15.5 % — ABNORMAL HIGH (ref 11.0–14.6)
WBC: 25.3 10*3/uL — ABNORMAL HIGH (ref 4.0–10.3)
nRBC: 0 % (ref 0–0)

## 2011-05-19 LAB — COMPREHENSIVE METABOLIC PANEL
ALT: 26 U/L (ref 0–53)
AST: 21 U/L (ref 0–37)
CO2: 21 mEq/L (ref 19–32)
Chloride: 101 mEq/L (ref 96–112)
Sodium: 134 mEq/L — ABNORMAL LOW (ref 135–145)
Total Bilirubin: 0.7 mg/dL (ref 0.3–1.2)
Total Protein: 6.9 g/dL (ref 6.0–8.3)

## 2011-05-19 LAB — TECHNOLOGIST REVIEW

## 2011-05-19 MED ORDER — ONDANSETRON 8 MG/50ML IVPB (CHCC)
8.0000 mg | Freq: Once | INTRAVENOUS | Status: AC
  Administered 2011-05-19: 8 mg via INTRAVENOUS

## 2011-05-19 MED ORDER — SODIUM CHLORIDE 0.9 % IV SOLN
500.0000 mg/m2 | Freq: Once | INTRAVENOUS | Status: AC
  Administered 2011-05-19: 875 mg via INTRAVENOUS
  Filled 2011-05-19: qty 35

## 2011-05-19 MED ORDER — DEXAMETHASONE SODIUM PHOSPHATE 10 MG/ML IJ SOLN
10.0000 mg | Freq: Once | INTRAMUSCULAR | Status: AC
  Administered 2011-05-19: 10 mg via INTRAVENOUS

## 2011-05-19 MED ORDER — SODIUM CHLORIDE 0.9 % IV SOLN
15.0000 mg/kg | Freq: Once | INTRAVENOUS | Status: AC
  Administered 2011-05-19: 975 mg via INTRAVENOUS
  Filled 2011-05-19: qty 39

## 2011-05-19 NOTE — Assessment & Plan Note (Addendum)
Unclear cause - favor infectious vs inflammatory given acute appearance within 2 months Improved with avelox + prednisone WIll taper pred gradually over next few weeks & reassess with CXR/ symptoms - Drop Prednisone to 10 mg on march 15 Schedule PFTS next visit OK to resume chemoRx OK to not take prednisone -days you take dexamethasone

## 2011-05-19 NOTE — Progress Notes (Signed)
[facility] Telephone:(336) [phone]   Fax:([phone]  OFFICE PROGRESS NOTE  Cynda Familia, MD, MD [address] [place] [address]  DIAGNOSIS: Metastatic non-small cell lung cancer adenocarcinoma diagnosed in September 2010   PRIOR THERAPY:  #1 status post 6 cycles of systemic chemotherapy with carboplatin, Alimta and Avastin given every 3 weeks last dose given 04/23/2009 with disease stabilization.  #2 status post palliative radiotherapy to the mediastinum under the care of Dr. Michell Heinrich. The patient received a total dose of 3000 cGY and 12 fractions completed 01/21/2009.   CURRENT THERAPY: Maintenance chemotherapy with Alimta at 500 mg per meter squared and Avastin at 15 mg per kilogram given every 3 weeks status post 33 cycles.   INTERVAL HISTORY: [name] 66 y.o. male returns to the clinic today for followup visit accompanied by his wife. The patient was seen recently by Dr. Vassie Loll and he was treated with a course of Avelox and prednisone for the questionable inflammatory lesions in his lung. He is feeling much better today but still has mild cough. He denied having any significant fever or chills. He is currently on prednisone 20 mg by mouth daily and this will be tapered gradually by Dr. Vassie Loll. He is here today to resume his systemic chemotherapy with single agent Alimta. He denied having any significant chest pain or shortness of breath except with exertion. No significant weight loss.  MEDICAL HISTORY: Past Medical History  Diagnosis Date  . Hypertension   . Arthritis   . Lung cancer     lung ca dx 11/2008  . Syncope and collapse     ALLERGIES:  is allergic to percocet and zolpidem tartrate.  MEDICATIONS:  Current Outpatient Prescriptions  Medication Sig Dispense Refill  . amLODipine (NORVASC) 2.5 MG tablet Take 2.5 mg by mouth daily.        . ATENOLOL PO Take 1 tablet by mouth daily.      . B Complex-Biotin-FA (B-COMPLEX PO) Take by  mouth. 1500mg  daily       . dexamethasone (DECADRON) 4 MG tablet Take 4 mg by mouth. 1 days before, day of, and after chemotherapy      . doxazosin (CARDURA) 2 MG tablet Take 2 mg by mouth at bedtime.        . FeFum-FePoly-FA-B Cmp-C-Biot (FOLIVANE-PLUS) CAPS Take 1 capsule by mouth daily.  90 capsule  1  . fexofenadine (ALLEGRA) 180 MG tablet Take 180 mg by mouth daily.        . folic acid (FOLVITE) 1 MG tablet Take 1 tablet (1 mg total) by mouth daily.  90 tablet  3  . HYDROcodone-homatropine (HYCODAN) 5-1.5 MG/5ML syrup take 1-2 tsp every 6 hours prn cough  120 mL  0  . ibuprofen (ADVIL,MOTRIN) 200 MG tablet Take 200 mg by mouth every 6 (six) hours as needed.        . Multiple Vitamin (MULTIVITAMIN) tablet Take 1 tablet by mouth daily.        Marland Kitchen omeprazole (PRILOSEC) 40 MG capsule Take 1 capsule (40 mg total) by mouth daily.  90 capsule  1  . predniSONE (DELTASONE) 10 MG tablet As directed.  60 tablet  0  . prochlorperazine (COMPAZINE) 10 MG tablet Take 10 mg by mouth every 6 (six) hours as needed.        Bernadette Hoit Sodium (SENOKOT S PO) Take by mouth. 3 tabs daily        No current  facility-administered medications for this visit.   Facility-Administered Medications Ordered in Other Visits  Medication Dose Route Frequency Provider Last Rate Last Dose  . bevacizumab (AVASTIN) 975 mg in sodium chloride 0.9 % 100 mL chemo infusion  15 mg/kg (Treatment Plan Actual) Intravenous Once Rashiya Lofland K. Pharell Rolfson, MD   975 mg at 05/19/11 1415  . dexamethasone (DECADRON) injection 10 mg  10 mg Intravenous Once Eyob Godlewski K. Barbra Miner, MD   10 mg at 05/19/11 1335  . ondansetron (ZOFRAN) IVPB 8 mg  8 mg Intravenous Once Prathik Aman K. Marjorie Deprey, MD   8 mg at 05/19/11 1335  . PEMEtrexed (ALIMTA) 875 mg in sodium chloride 0.9 % 100 mL chemo infusion  500 mg/m2 (Treatment Plan Actual) Intravenous Once Lynore Coscia K. Arbutus Ped, MD   875 mg at 05/19/11 1451    SURGICAL HISTORY:  Past Surgical History  Procedure Date    . 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 Rotator cuff repair     REVIEW OF SYSTEMS:  A comprehensive review of systems was negative except for: Constitutional: positive for fatigue Respiratory: positive for chronic bronchitis, cough and dyspnea on exertion   PHYSICAL EXAMINATION: General appearance: alert, cooperative and no distress Neck: no adenopathy Lymph nodes: Cervical, supraclavicular, and axillary nodes normal. Resp: clear to auscultation bilaterally Cardio: regular rate and rhythm, S1, S2 normal, no murmur, click, rub or gallop GI: soft, non-tender; bowel sounds normal; no masses,  no organomegaly Extremities: extremities normal, atraumatic, no cyanosis or edema Neurologic: Alert and oriented X 3, normal strength and tone. Normal symmetric reflexes. Normal coordination and gait  ECOG PERFORMANCE STATUS: 1 - Symptomatic but completely ambulatory  Blood pressure 155/87, pulse 66, temperature 94.6 F (34.8 C), temperature source Oral, height 5\' 8"  (1.727 m), weight 134 lb 6.4 oz (60.963 kg).  LABORATORY DATA: Lab Results  Component Value Date   WBC 25.3* 05/19/2011   HGB 12.8* 05/19/2011   HCT 37.0* 05/19/2011   MCV 100.0* 05/19/2011   PLT 192 05/19/2011      Chemistry      Component Value Date/Time   NA 134* 05/19/2011 1131   K 4.2 05/19/2011 1131   CL 101 05/19/2011 1131   CO2 21 05/19/2011 1131   BUN 34* 05/19/2011 1131   CREATININE 1.07 05/19/2011 1131      Component Value Date/Time   CALCIUM 9.6 05/19/2011 1131   ALKPHOS 85 05/19/2011 1131   AST 21 05/19/2011 1131   ALT 26 05/19/2011 1131   BILITOT 0.7 05/19/2011 1131       RADIOGRAPHIC STUDIES: Dg Chest 2 View  05/18/2011  *RADIOLOGY REPORT*  Clinical Data: Follow up pulmonary infiltrate, lung cancer  CHEST - 2 VIEW  Comparison: CT chest dated 04/30/2011  Findings: Mild patchy opacity at the left lung base.  Otherwise, the majority of the prior basilar nodular opacities are not radiographically  apparent on the current study. No pleural effusion or pneumothorax.  Cardiomediastinal silhouette is within normal limits.  Right chest power port.  Mild degenerative changes of the visualized thoracolumbar spine.  IMPRESSION: Mild patchy opacity at the left lung base.  Otherwise, the majority of the prior basilar nodular opacities are not radiographically apparent on the current study.  Original Report Authenticated By: Charline Bills, M.D.   Ct Chest W Contrast  04/30/2011  *RADIOLOGY REPORT*  Clinical Data:  Metastatic non-small cell lung cancer diagnosed September 2010.  Patient is status post six cycles chemo therapy completed and January 2011  and radiotherapy completed in 2010. The patient currently on maintenance chemotherapy.  CT CHEST, ABDOMEN AND PELVIS WITH CONTRAST  Technique:  Multidetector CT imaging of the chest, abdomen and pelvis was performed following the standard protocol during bolus administration of intravenous contrast.  Contrast: OMNIPAQUE IOHEXOL 300 MG/ML IV SOLN  Comparison:  CT 02/22/2011  CT CHEST  Findings:  Port right anterior chest wall.  No axillary or supraclavicular lymphadenopathy.  No mediastinal lymphadenopathy. No pericardial fluid.  Esophagus is normal.  Review of the lung windows demonstrates bilateral lower lobe ground- glass nodules of varying sizes which are new from prior.  These nodules number approximately 10-20 in the lower lobes and range in size from 5 mm.  There are similar nodules in the inferior aspect of the right middle lobe and lingula.  The more superior upper lobes are spared. These lesions were not present on comparison CT from 02/22/2011.  There is paramediastinal fibrotic thickening consistent radiation change in the right upper lobe and a stable pleural based nodular thickening in the left upper lobe (image 11).  IMPRESSION:  1.  Multiple new ground-glass nodules in the lower lobes. The rapid onset of these lesions  suggest infectious or  inflammatory etiology.  Cannot exclude multifocal adenocarcinoma although this is felt less likely.  Recommend short-term follow-up CT for reevaluation.  2.  Stable radiation change in the right upper lobe.  CT ABDOMEN AND PELVIS  Findings:  No focal hepatic lesion.  The gallbladder, pancreas, spleen, adrenal glands, and kidneys are unchanged.  There is a low density lesion in the left kidney likely representing cyst.  The stomach, small bowel, and colon are normal.  Abdominal aorta is normal caliber. There is atherosclerotic change. No retroperitoneal lymphadenopathy.  No free fluid the pelvis.  Bladder prostate gland are normal.  No pelvic lymphadenopathy. Review of  bone windows demonstrates no aggressive osseous lesions.  IMPRESSION:  1.  No evidence of  metastasis within the abdomen or  pelvis. 2.  Atherosclerotic change in the aorta.  Original Report Authenticated By: Genevive Bi, M.D.    ASSESSMENT: This is a very pleasant 66 years old white male with metastatic non-small cell lung cancer currently on maintenance chemotherapy with Alimta was no evidence for disease progression. He was found on recent scan to have inflammatory lesions in his lung and he was treated with a course of Avelox and prednisone. He is feeling much better today and ready to resume his systemic chemotherapy after seeing Dr. Vassie Loll yesterday.   PLAN: We will proceed with cycle #34 of his chemotherapy today as scheduled. For cough I gave the patient refill of Hycodan. The patient will come back for followup visit in 3 weeks for reevaluation before starting the next cycle of his chemotherapy. He will continue with the prednisone taper under the care of Dr. Vassie Loll. The patient was advised to call immediately if he has any concerning symptoms in the interval.  All questions were answered. The patient knows to call the clinic with any problems, questions or concerns. We can certainly see the patient much sooner if necessary.

## 2011-05-20 ENCOUNTER — Encounter (HOSPITAL_COMMUNITY): Payer: Medicare Other

## 2011-05-21 ENCOUNTER — Encounter: Payer: Self-pay | Admitting: Internal Medicine

## 2011-05-25 ENCOUNTER — Encounter (HOSPITAL_COMMUNITY): Payer: Medicare Other

## 2011-05-25 ENCOUNTER — Ambulatory Visit: Payer: Medicare Other | Admitting: Pulmonary Disease

## 2011-05-26 ENCOUNTER — Other Ambulatory Visit: Payer: Medicare Other | Admitting: Lab

## 2011-05-26 ENCOUNTER — Ambulatory Visit: Payer: Medicare Other

## 2011-05-27 ENCOUNTER — Encounter (HOSPITAL_COMMUNITY): Payer: Medicare Other

## 2011-06-01 ENCOUNTER — Encounter (HOSPITAL_COMMUNITY): Payer: Medicare Other

## 2011-06-09 ENCOUNTER — Ambulatory Visit: Payer: Medicare Other

## 2011-06-09 ENCOUNTER — Ambulatory Visit (HOSPITAL_BASED_OUTPATIENT_CLINIC_OR_DEPARTMENT_OTHER): Payer: Medicare Other

## 2011-06-09 ENCOUNTER — Encounter: Payer: Self-pay | Admitting: Physician Assistant

## 2011-06-09 ENCOUNTER — Ambulatory Visit (HOSPITAL_BASED_OUTPATIENT_CLINIC_OR_DEPARTMENT_OTHER): Payer: Medicare Other | Admitting: Physician Assistant

## 2011-06-09 ENCOUNTER — Other Ambulatory Visit (HOSPITAL_BASED_OUTPATIENT_CLINIC_OR_DEPARTMENT_OTHER): Payer: Medicare Other | Admitting: Lab

## 2011-06-09 VITALS — BP 150/88 | HR 70 | Temp 97.1°F | Ht 68.0 in | Wt 136.9 lb

## 2011-06-09 DIAGNOSIS — Z5111 Encounter for antineoplastic chemotherapy: Secondary | ICD-10-CM

## 2011-06-09 DIAGNOSIS — C3410 Malignant neoplasm of upper lobe, unspecified bronchus or lung: Secondary | ICD-10-CM

## 2011-06-09 DIAGNOSIS — J42 Unspecified chronic bronchitis: Secondary | ICD-10-CM

## 2011-06-09 DIAGNOSIS — R05 Cough: Secondary | ICD-10-CM

## 2011-06-09 DIAGNOSIS — R0989 Other specified symptoms and signs involving the circulatory and respiratory systems: Secondary | ICD-10-CM

## 2011-06-09 LAB — CBC WITH DIFFERENTIAL/PLATELET
BASO%: 0.2 % (ref 0.0–2.0)
MCHC: 35.2 g/dL (ref 32.0–36.0)
MONO#: 0.2 10*3/uL (ref 0.1–0.9)
RBC: 3.42 10*6/uL — ABNORMAL LOW (ref 4.20–5.82)
WBC: 6.5 10*3/uL (ref 4.0–10.3)
lymph#: 0.4 10*3/uL — ABNORMAL LOW (ref 0.9–3.3)
nRBC: 0 % (ref 0–0)

## 2011-06-09 LAB — COMPREHENSIVE METABOLIC PANEL
ALT: 54 U/L — ABNORMAL HIGH (ref 0–53)
AST: 32 U/L (ref 0–37)
Calcium: 9.8 mg/dL (ref 8.4–10.5)
Chloride: 98 mEq/L (ref 96–112)
Creatinine, Ser: 1.1 mg/dL (ref 0.50–1.35)
Potassium: 4 mEq/L (ref 3.5–5.3)

## 2011-06-09 MED ORDER — DEXAMETHASONE SODIUM PHOSPHATE 10 MG/ML IJ SOLN
10.0000 mg | Freq: Once | INTRAMUSCULAR | Status: AC
  Administered 2011-06-09: 10 mg via INTRAVENOUS

## 2011-06-09 MED ORDER — HEPARIN SOD (PORK) LOCK FLUSH 100 UNIT/ML IV SOLN
500.0000 [IU] | Freq: Once | INTRAVENOUS | Status: AC | PRN
  Administered 2011-06-09: 500 [IU]
  Filled 2011-06-09: qty 5

## 2011-06-09 MED ORDER — CYANOCOBALAMIN 1000 MCG/ML IJ SOLN
1000.0000 ug | Freq: Once | INTRAMUSCULAR | Status: AC
  Administered 2011-06-09: 1000 ug via INTRAMUSCULAR

## 2011-06-09 MED ORDER — SODIUM CHLORIDE 0.9 % IV SOLN
500.0000 mg/m2 | Freq: Once | INTRAVENOUS | Status: AC
  Administered 2011-06-09: 875 mg via INTRAVENOUS
  Filled 2011-06-09: qty 35

## 2011-06-09 MED ORDER — ONDANSETRON 8 MG/50ML IVPB (CHCC)
8.0000 mg | Freq: Once | INTRAVENOUS | Status: AC
  Administered 2011-06-09: 8 mg via INTRAVENOUS

## 2011-06-09 MED ORDER — SODIUM CHLORIDE 0.9 % IJ SOLN
10.0000 mL | INTRAMUSCULAR | Status: DC | PRN
  Administered 2011-06-09: 10 mL
  Filled 2011-06-09: qty 10

## 2011-06-09 MED ORDER — SODIUM CHLORIDE 0.9 % IV SOLN
Freq: Once | INTRAVENOUS | Status: AC
  Administered 2011-06-09: 15:00:00 via INTRAVENOUS

## 2011-06-09 MED ORDER — SODIUM CHLORIDE 0.9 % IV SOLN
15.0000 mg/kg | Freq: Once | INTRAVENOUS | Status: AC
  Administered 2011-06-09: 975 mg via INTRAVENOUS
  Filled 2011-06-09: qty 39

## 2011-06-10 ENCOUNTER — Ambulatory Visit: Payer: Medicare Other

## 2011-06-10 ENCOUNTER — Other Ambulatory Visit: Payer: Self-pay | Admitting: Certified Registered Nurse Anesthetist

## 2011-06-10 ENCOUNTER — Telehealth: Payer: Self-pay | Admitting: Internal Medicine

## 2011-06-10 NOTE — Telephone Encounter (Signed)
called pt lmovm for appt on 04/03 and i informed pt to pick up scheduled pt for [id number]

## 2011-06-14 NOTE — Progress Notes (Signed)
[facility] Telephone:(336) [phone]   Fax:([phone]  OFFICE PROGRESS NOTE  Cynda Familia, MD, MD [address] [place] [address]  DIAGNOSIS: Metastatic non-small cell lung cancer adenocarcinoma diagnosed in September 2010   PRIOR THERAPY:  #1 status post 6 cycles of systemic chemotherapy with carboplatin, Alimta and Avastin given every 3 weeks last dose given 04/23/2009 with disease stabilization.  #2 status post palliative radiotherapy to the mediastinum under the care of Dr. Michell Heinrich. The patient received a total dose of 3000 cGY and 12 fractions completed 01/21/2009.   CURRENT THERAPY: Maintenance chemotherapy with Alimta at 500 mg per meter squared and Avastin at 15 mg per kilogram given every 3 weeks status post 34 cycles.   INTERVAL HISTORY: [name] 66 y.o. male returns to the clinic today for followup visit accompanied by his wife. The patient remains of prednisone 10 mg per day per Dr. Vassie Loll through April when he is to follow up with him.He is feeling much better today but still has mild cough. He denied having any significant fever or chills. He has lost some weight through this illness. He denied having any significant chest pain or shortness of breath except with exertion.   MEDICAL HISTORY: Past Medical History  Diagnosis Date  . Hypertension   . Arthritis   . Lung cancer     lung ca dx 11/2008  . Syncope and collapse     ALLERGIES:  is allergic to percocet and zolpidem tartrate.  MEDICATIONS:  Current Outpatient Prescriptions  Medication Sig Dispense Refill  . amLODipine (NORVASC) 2.5 MG tablet Take 2.5 mg by mouth daily.        . ATENOLOL PO Take 1 tablet by mouth daily.      . B Complex-Biotin-FA (B-COMPLEX PO) Take by mouth. 1500mg  daily       . dexamethasone (DECADRON) 4 MG tablet Take 4 mg by mouth. 1 days before, day of, and after chemotherapy      . doxazosin (CARDURA) 2 MG tablet Take 2 mg by mouth at bedtime.         . FeFum-FePoly-FA-B Cmp-C-Biot (FOLIVANE-PLUS) CAPS Take 1 capsule by mouth daily.  90 capsule  1  . fexofenadine (ALLEGRA) 180 MG tablet Take 180 mg by mouth daily.        . folic acid (FOLVITE) 1 MG tablet Take 1 tablet (1 mg total) by mouth daily.  90 tablet  3  . HYDROcodone-homatropine (HYCODAN) 5-1.5 MG/5ML syrup Take 5 mLs by mouth every 6 (six) hours as needed.      Marland Kitchen ibuprofen (ADVIL,MOTRIN) 200 MG tablet Take 200 mg by mouth every 6 (six) hours as needed.        . Multiple Vitamin (MULTIVITAMIN) tablet Take 1 tablet by mouth daily.        Marland Kitchen omeprazole (PRILOSEC) 40 MG capsule Take 1 capsule (40 mg total) by mouth daily.  90 capsule  1  . predniSONE (DELTASONE) 10 MG tablet As directed.  60 tablet  0  . prochlorperazine (COMPAZINE) 10 MG tablet Take 10 mg by mouth every 6 (six) hours as needed.        Bernadette Hoit Sodium (SENOKOT S PO) Take by mouth. 3 tabs daily         SURGICAL HISTORY:  Past Surgical History  Procedure Date  . 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 Rotator cuff repair  REVIEW OF SYSTEMS:  A comprehensive review of systems was negative except for: Constitutional: positive for fatigue Respiratory: positive for chronic bronchitis, cough and dyspnea on exertion   PHYSICAL EXAMINATION: General appearance: alert, cooperative and no distress Neck: no adenopathy Lymph nodes: Cervical, supraclavicular, and axillary nodes normal. Resp: clear to auscultation bilaterally Cardio: regular rate and rhythm, S1, S2 normal, no murmur, click, rub or gallop GI: soft, non-tender; bowel sounds normal; no masses,  no organomegaly Extremities: extremities normal, atraumatic, no cyanosis or edema Neurologic: Alert and oriented X 3, normal strength and tone. Normal symmetric reflexes. Normal coordination and gait  ECOG PERFORMANCE STATUS: 1 - Symptomatic but completely ambulatory  Blood pressure 150/88, pulse 70, temperature 97.1 F (36.2 C),  temperature source Oral, height 5\' 8"  (1.727 m), weight 136 lb 14.4 oz (62.097 kg).  LABORATORY DATA: Lab Results  Component Value Date   WBC 6.5 06/09/2011   HGB 12.0* 06/09/2011   HCT 34.1* 06/09/2011   MCV 99.7* 06/09/2011   PLT 118* 06/09/2011      Chemistry      Component Value Date/Time   NA 134* 06/09/2011 1321   K 4.0 06/09/2011 1321   CL 98 06/09/2011 1321   CO2 28 06/09/2011 1321   BUN 22 06/09/2011 1321   CREATININE 1.10 06/09/2011 1321      Component Value Date/Time   CALCIUM 9.8 06/09/2011 1321   ALKPHOS 64 06/09/2011 1321   AST 32 06/09/2011 1321   ALT 54* 06/09/2011 1321   BILITOT 0.5 06/09/2011 1321       RADIOGRAPHIC STUDIES: Dg Chest 2 View  05/18/2011  *RADIOLOGY REPORT*  Clinical Data: Follow up pulmonary infiltrate, lung cancer  CHEST - 2 VIEW  Comparison: CT chest dated 04/30/2011  Findings: Mild patchy opacity at the left lung base.  Otherwise, the majority of the prior basilar nodular opacities are not radiographically apparent on the current study. No pleural effusion or pneumothorax.  Cardiomediastinal silhouette is within normal limits.  Right chest power port.  Mild degenerative changes of the visualized thoracolumbar spine.  IMPRESSION: Mild patchy opacity at the left lung base.  Otherwise, the majority of the prior basilar nodular opacities are not radiographically apparent on the current study.  Original Report Authenticated By: Charline Bills, M.D.   Ct Chest W Contrast  04/30/2011  *RADIOLOGY REPORT*  Clinical Data:  Metastatic non-small cell lung cancer diagnosed September 2010.  Patient is status post six cycles chemo therapy completed and January 2011 and radiotherapy completed in 2010. The patient currently on maintenance chemotherapy.  CT CHEST, ABDOMEN AND PELVIS WITH CONTRAST  Technique:  Multidetector CT imaging of the chest, abdomen and pelvis was performed following the standard protocol during bolus administration of intravenous contrast.  Contrast:  OMNIPAQUE IOHEXOL 300 MG/ML IV SOLN  Comparison:  CT 02/22/2011  CT CHEST  Findings:  Port right anterior chest wall.  No axillary or supraclavicular lymphadenopathy.  No mediastinal lymphadenopathy. No pericardial fluid.  Esophagus is normal.  Review of the lung windows demonstrates bilateral lower lobe ground- glass nodules of varying sizes which are new from prior.  These nodules number approximately 10-20 in the lower lobes and range in size from 5 mm.  There are similar nodules in the inferior aspect of the right middle lobe and lingula.  The more superior upper lobes are spared. These lesions were not present on comparison CT from 02/22/2011.  There is paramediastinal fibrotic thickening consistent radiation change in the right upper lobe and a  stable pleural based nodular thickening in the left upper lobe (image 11).  IMPRESSION:  1.  Multiple new ground-glass nodules in the lower lobes. The rapid onset of these lesions  suggest infectious or inflammatory etiology.  Cannot exclude multifocal adenocarcinoma although this is felt less likely.  Recommend short-term follow-up CT for reevaluation.  2.  Stable radiation change in the right upper lobe.  CT ABDOMEN AND PELVIS  Findings:  No focal hepatic lesion.  The gallbladder, pancreas, spleen, adrenal glands, and kidneys are unchanged.  There is a low density lesion in the left kidney likely representing cyst.  The stomach, small bowel, and colon are normal.  Abdominal aorta is normal caliber. There is atherosclerotic change. No retroperitoneal lymphadenopathy.  No free fluid the pelvis.  Bladder prostate gland are normal.  No pelvic lymphadenopathy. Review of  bone windows demonstrates no aggressive osseous lesions.  IMPRESSION:  1.  No evidence of  metastasis within the abdomen or  pelvis. 2.  Atherosclerotic change in the aorta.  Original Report Authenticated By: Genevive Bi, M.D.    ASSESSMENT/PLAN: This is a very pleasant 66 years old white male  with metastatic non-small cell lung cancer currently on maintenance chemotherapy with Alimta was no evidence for disease progression. He was found on recent scan to have inflammatory lesions in his lung and he was treated with a course of Avelox and prednisone. He is continuing his prednisone taper per Dr. Vassie Loll and will follow up with him as scheduled in April. The patient was discussed with Dr. Arbutus Ped. He will proceed with his next scheduled of maintenance chemotherapy with Alimta and Avastin today. He'll followup with Dr. Arbutus Ped in 3 weeks with repeat CBC differential C. met and urine protein dipstick and CT of the chest abdomen and pelvis with contrast to reevaluate his disease. He is encouraged to followup with Dr. Vassie Loll as scheduled.  Laural Benes, Areyana Leoni E, PA-C   All questions were answered. The patient knows to call the clinic with any problems, questions or concerns. We can certainly see the patient much sooner if necessary.

## 2011-06-16 ENCOUNTER — Other Ambulatory Visit: Payer: Medicare Other | Admitting: Lab

## 2011-06-16 ENCOUNTER — Ambulatory Visit: Payer: Medicare Other

## 2011-06-25 ENCOUNTER — Encounter (HOSPITAL_COMMUNITY): Payer: Self-pay

## 2011-06-25 ENCOUNTER — Ambulatory Visit (HOSPITAL_COMMUNITY)
Admission: RE | Admit: 2011-06-25 | Discharge: 2011-06-25 | Disposition: A | Discharge status: Home / Self Care | Payer: Medicare Other | Source: Ambulatory Visit | Attending: Physician Assistant | Admitting: Physician Assistant

## 2011-06-25 DIAGNOSIS — C3410 Malignant neoplasm of upper lobe, unspecified bronchus or lung: Secondary | ICD-10-CM | POA: Insufficient documentation

## 2011-06-25 MED ORDER — IOHEXOL 300 MG/ML  SOLN: 100.0000 mL | Freq: Once | INTRAMUSCULAR | Status: AC | PRN

## 2011-06-30 ENCOUNTER — Other Ambulatory Visit: Payer: Medicare Other | Admitting: Lab

## 2011-06-30 ENCOUNTER — Ambulatory Visit (HOSPITAL_BASED_OUTPATIENT_CLINIC_OR_DEPARTMENT_OTHER): Payer: Medicare Other | Admitting: Internal Medicine

## 2011-06-30 ENCOUNTER — Ambulatory Visit (HOSPITAL_BASED_OUTPATIENT_CLINIC_OR_DEPARTMENT_OTHER): Payer: Medicare Other

## 2011-06-30 ENCOUNTER — Other Ambulatory Visit (HOSPITAL_BASED_OUTPATIENT_CLINIC_OR_DEPARTMENT_OTHER): Payer: Medicare Other | Admitting: Lab

## 2011-06-30 DIAGNOSIS — C3410 Malignant neoplasm of upper lobe, unspecified bronchus or lung: Secondary | ICD-10-CM

## 2011-06-30 DIAGNOSIS — C3490 Malignant neoplasm of unspecified part of unspecified bronchus or lung: Secondary | ICD-10-CM

## 2011-06-30 DIAGNOSIS — Z5111 Encounter for antineoplastic chemotherapy: Secondary | ICD-10-CM

## 2011-06-30 LAB — CBC WITH DIFFERENTIAL/PLATELET
BASO%: 0.1 % (ref 0.0–2.0)
EOS%: 0 % (ref 0.0–7.0)
Eosinophils Absolute: 0 10*3/uL (ref 0.0–0.5)
LYMPH%: 6.1 % — ABNORMAL LOW (ref 14.0–49.0)
MCHC: 35 g/dL (ref 32.0–36.0)
MCV: 100 fL — ABNORMAL HIGH (ref 79.3–98.0)
MONO%: 8.4 % (ref 0.0–14.0)
NEUT#: 8 10*3/uL — ABNORMAL HIGH (ref 1.5–6.5)
Platelets: 210 10*3/uL (ref 140–400)
RBC: 3.26 10*6/uL — ABNORMAL LOW (ref 4.20–5.82)
RDW: 17.3 % — ABNORMAL HIGH (ref 11.0–14.6)
nRBC: 0 % (ref 0–0)

## 2011-06-30 LAB — COMPREHENSIVE METABOLIC PANEL
ALT: 23 U/L (ref 0–53)
AST: 24 U/L (ref 0–37)
Alkaline Phosphatase: 60 U/L (ref 39–117)
Creatinine, Ser: 1.14 mg/dL (ref 0.50–1.35)
Sodium: 138 mEq/L (ref 135–145)
Total Bilirubin: 0.6 mg/dL (ref 0.3–1.2)
Total Protein: 6.7 g/dL (ref 6.0–8.3)

## 2011-06-30 MED ORDER — DEXAMETHASONE SODIUM PHOSPHATE 10 MG/ML IJ SOLN
10.0000 mg | Freq: Once | INTRAMUSCULAR | Status: AC
  Administered 2011-06-30: 10 mg via INTRAVENOUS

## 2011-06-30 MED ORDER — SODIUM CHLORIDE 0.9 % IV SOLN
500.0000 mg/m2 | Freq: Once | INTRAVENOUS | Status: AC
  Administered 2011-06-30: 875 mg via INTRAVENOUS
  Filled 2011-06-30: qty 35

## 2011-06-30 MED ORDER — SODIUM CHLORIDE 0.9 % IV SOLN
Freq: Once | INTRAVENOUS | Status: AC
  Administered 2011-06-30: 14:00:00 via INTRAVENOUS

## 2011-06-30 MED ORDER — ONDANSETRON 8 MG/50ML IVPB (CHCC)
8.0000 mg | Freq: Once | INTRAVENOUS | Status: AC
Start: 2011-06-30 — End: 2011-06-30
  Administered 2011-06-30: 8 mg via INTRAVENOUS

## 2011-06-30 MED ORDER — SODIUM CHLORIDE 0.9 % IV SOLN
15.0000 mg/kg | Freq: Once | INTRAVENOUS | Status: AC
  Administered 2011-06-30: 975 mg via INTRAVENOUS
  Filled 2011-06-30: qty 39

## 2011-06-30 NOTE — Progress Notes (Signed)
[facility] [phone]   Fax:([phone]  OFFICE PROGRESS NOTE  Nathan Rua, MD, MD [address] [place] [address]  DIAGNOSIS: Metastatic non-small cell lung cancer adenocarcinoma diagnosed in September 2010   PRIOR THERAPY:  #1 status post 6 cycles of systemic chemotherapy with carboplatin, Alimta and Avastin given every 3 weeks last dose given 04/23/2009 with disease stabilization.  #2 status post palliative radiotherapy to the mediastinum under the care of Dr. Michell Heinrich. The patient received a total dose of 3000 cGY and 12 fractions completed 01/21/2009.  #3 Maintenance chemotherapy with Alimta at 500 mg per meter squared and Avastin at 15 mg per kilogram given every 3 weeks status post 35 cycles.  CURRENT THERAPY: The patient will start today the first cycle of maintenance chemotherapy with Alimta 500 mg/M2 and Avastin 15 mg/kg every 4 weeks.   INTERVAL HISTORY: [name] 66 y.o. male returns to the clinic today for followup visit accompanied by his wife. The patient is tolerating his treatment with maintenance Alimta and Avastin fairly well. He has no significant complaints except for fatigue. He has improvement in the previously inflammatory process his lung with less shortness of breath. The patient denied having any significant chest pain, no weight loss or night sweats. He is wondering if it would be okay to decrease the frequency of his chemotherapy cycles to every 4 weeks because of the fatigue and also to have less frequent scan monitoring because of concern about the radiation exposure. Otherwise he is doing fine. He has repeat CT scan of the chest, abdomen and pelvis performed recently and he is here today for evaluation and discussion of his scan results.  MEDICAL HISTORY: Past Medical History  Diagnosis Date  . Hypertension   . Arthritis   . Syncope and collapse   . Lung cancer     lung ca dx 11/2008     ALLERGIES:  is allergic to percocet and zolpidem tartrate.  MEDICATIONS:  Current Outpatient Prescriptions  Medication Sig Dispense Refill  . amLODipine (NORVASC) 2.5 MG tablet Take 2.5 mg by mouth daily.        . ATENOLOL PO Take 1 tablet by mouth daily.      . B Complex-Biotin-FA (B-COMPLEX PO) Take by mouth. 1500mg  daily       . dexamethasone (DECADRON) 4 MG tablet Take 4 mg by mouth. 1 days before, day of, and after chemotherapy      . doxazosin (CARDURA) 2 MG tablet Take 2 mg by mouth at bedtime.        . FeFum-FePoly-FA-B Cmp-C-Biot (FOLIVANE-PLUS) CAPS Take 1 capsule by mouth daily.  90 capsule  1  . fexofenadine (ALLEGRA) 180 MG tablet Take 180 mg by mouth daily.        . folic acid (FOLVITE) 1 MG tablet Take 1 tablet (1 mg total) by mouth daily.  90 tablet  3  . HYDROcodone-homatropine (HYCODAN) 5-1.5 MG/5ML syrup Take 5 mLs by mouth every 6 (six) hours as needed.      Marland Kitchen ibuprofen (ADVIL,MOTRIN) 200 MG tablet Take 200 mg by mouth every 6 (six) hours as needed.        . Multiple Vitamin (MULTIVITAMIN) tablet Take 1 tablet by mouth daily.        Marland Kitchen omeprazole (PRILOSEC) 40 MG capsule Take 1 capsule (40 mg total) by mouth daily.  90 capsule  1  . predniSONE (DELTASONE) 10 MG tablet As directed.  60 tablet  0  . prochlorperazine (COMPAZINE) 10 MG tablet Take 10 mg by mouth every 6 (six) hours as needed.        Bernadette Hoit Sodium (SENOKOT S PO) Take by mouth. 3 tabs daily        No current facility-administered medications for this visit.   Facility-Administered Medications Ordered in Other Visits  Medication Dose Route Frequency Provider Last Rate Last Dose  . 0.9 %  sodium chloride infusion   Intravenous Once Si Gaul, MD      . bevacizumab (AVASTIN) 975 mg in sodium chloride 0.9 % 100 mL chemo infusion  15 mg/kg (Treatment Plan Actual) Intravenous Once Si Gaul, MD   975 mg at 06/30/11 1436  . dexamethasone (DECADRON) injection 10 mg  10 mg Intravenous  Once Si Gaul, MD   10 mg at 06/30/11 1400  . ondansetron (ZOFRAN) IVPB 8 mg  8 mg Intravenous Once Si Gaul, MD   8 mg at 06/30/11 1400  . PEMEtrexed (ALIMTA) 875 mg in sodium chloride 0.9 % 100 mL chemo infusion  500 mg/m2 (Treatment Plan Actual) Intravenous Once Si Gaul, MD   875 mg at 06/30/11 1518    SURGICAL HISTORY:  Past Surgical History  Procedure Date  . 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 Rotator cuff repair     REVIEW OF SYSTEMS:  A comprehensive review of systems was negative except for: Constitutional: positive for fatigue   PHYSICAL EXAMINATION: General appearance: alert, cooperative and no distress Head: Normocephalic, without obvious abnormality, atraumatic Neck: no adenopathy Lymph nodes: Cervical, supraclavicular, and axillary nodes normal. Resp: clear to auscultation bilaterally Cardio: regular rate and rhythm, S1, S2 normal, no murmur, click, rub or gallop GI: soft, non-tender; bowel sounds normal; no masses,  no organomegaly Extremities: extremities normal, atraumatic, no cyanosis or edema Neurologic: Alert and oriented X 3, normal strength and tone. Normal symmetric reflexes. Normal coordination and gait  ECOG PERFORMANCE STATUS: 1 - Symptomatic but completely ambulatory  There were no vitals taken for this visit.  LABORATORY DATA: Lab Results  Component Value Date   WBC 9.4 06/30/2011   HGB 11.4* 06/30/2011   HCT 32.6* 06/30/2011   MCV 100.0* 06/30/2011   PLT 210 06/30/2011      Chemistry      Component Value Date/Time   NA 134* 06/09/2011 1321   K 4.0 06/09/2011 1321   CL 98 06/09/2011 1321   CO2 28 06/09/2011 1321   BUN 22 06/09/2011 1321   CREATININE 1.10 06/09/2011 1321      Component Value Date/Time   CALCIUM 9.8 06/09/2011 1321   ALKPHOS 64 06/09/2011 1321   AST 32 06/09/2011 1321   ALT 54* 06/09/2011 1321   BILITOT 0.5 06/09/2011 1321       RADIOGRAPHIC STUDIES: Ct Chest W Contrast  06/25/2011   *RADIOLOGY REPORT*  Clinical Data:  Lung cancer diagnosed 11/2008, chemotherapy ongoing, XRT completed in 02/2009.  CT CHEST, ABDOMEN AND PELVIS WITH CONTRAST  Technique:  Multidetector CT imaging of the chest, abdomen and pelvis was performed following the standard protocol during bolus administration of intravenous contrast.  Contrast:  100 ml Omnipaque-300 IV  Comparison:  04/30/2011   CT CHEST  Findings:  Stable right paramediastinal radiation changes.  Stable suspected pleural parenchymal nodularity in the left lung apex (series 5/image 11).  Prior right middle lobe and left lower lobe nodular opacities have resolved.  Mild residual ground-glass opacity in the  posterior right lower lobe and lingula, improved, likely infectious/inflammatory.  Underlying mild subpleural reticulation/paraseptal emphysematous changes in the lower lobes.  No new/suspicious pulmonary nodules. No pleural effusion or pneumothorax.  Visualized thyroid is unremarkable.  The heart is normal in size.  No pericardial effusion.   Coronary atherosclerosis.  Atherosclerotic calcifications of the aortic arch.  Right chest port.  Small mediastinal lymph nodes which do not meet pathologic CT size criteria.  Degenerative changes of the thoracic spine.  IMPRESSION: Stable right paramediastinal radiation changes.  No evidence of recurrent or metastatic disease in the chest.  Near complete resolution of prior lower lobe predominant nodular ground-glass opacities, likely infectious/inflammatory.   CT ABDOMEN AND PELVIS  Findings:  Liver, spleen, pancreas, and adrenal glands are within normal limits.  Gallbladder is unremarkable.  No intrahepatic or extrahepatic ductal dilatation.  1.4 cm left upper pole cysts.  Right kidney is unremarkable.  No hydronephrosis.  No evidence of bowel obstruction.  Atherosclerotic calcifications of the abdominal aorta and branch vessels.  No abdominopelvic ascites.  Small retroperitoneal lymph nodes which do not meet  pathologic CT size criteria.  No suspicious abdominopelvic lymphadenopathy.  Prostate is unremarkable.  Bladder is within normal limits.  Mild degenerative changes of the lumbar spine.  IMPRESSION: No evidence of metastatic disease in the abdomen/pelvis.  Original Report Authenticated By: Charline Bills, M.D.    ASSESSMENT: This is a very pleasant 66 years old white male with metastatic non-small cell lung cancer , adenocarcinoma. The patient is currently on maintenance chemotherapy with Alimta and Avastin every 3 weeks. He is doing fine and tolerating his treatment well except for the fatigue. The recent CT scan of the chest abdomen and pelvis showed no evidence for disease progression.  PLAN: I discussed the scan results with the patient and his wife. I agreed with his request to change the frequency of his maintenance therapy to every 4 weeks with Alimta 500 mg/M2 and Avastin 15 mg/kilogram. The patient to start the first cycle of this regimen today. He would come back for followup visit in 4 weeks with the next cycle of this treatment. He is monitoring scans will be done every three-month. The patient was advised to call me immediately if he has any concerning symptoms in the interval.  All questions were answered. The patient knows to call the clinic with any problems, questions or concerns. We can certainly see the patient much sooner if necessary.  I spent 20 minutes counseling the patient face to face. The total time spent in the appointment was 30 minutes.

## 2011-07-01 ENCOUNTER — Telehealth: Payer: Self-pay | Admitting: Internal Medicine

## 2011-07-01 NOTE — Telephone Encounter (Signed)
pt aware of 5/1 appt and will p/u sch at that time   aom

## 2011-07-05 ENCOUNTER — Other Ambulatory Visit: Payer: Self-pay | Admitting: Certified Registered Nurse Anesthetist

## 2011-07-07 ENCOUNTER — Encounter: Payer: Self-pay | Admitting: Pulmonary Disease

## 2011-07-07 ENCOUNTER — Ambulatory Visit (INDEPENDENT_AMBULATORY_CARE_PROVIDER_SITE_OTHER): Payer: Medicare Other | Admitting: Pulmonary Disease

## 2011-07-07 VITALS — BP 128/72 | HR 72 | Temp 97.9°F | Ht 68.0 in | Wt 135.0 lb

## 2011-07-07 DIAGNOSIS — R918 Other nonspecific abnormal finding of lung field: Secondary | ICD-10-CM

## 2011-07-07 DIAGNOSIS — Z23 Encounter for immunization: Secondary | ICD-10-CM

## 2011-07-07 DIAGNOSIS — R05 Cough: Secondary | ICD-10-CM

## 2011-07-07 MED ORDER — PREDNISONE 5 MG PO TABS: 5.0000 mg | ORAL_TABLET | Freq: Every day | ORAL | Status: AC

## 2011-07-07 NOTE — Progress Notes (Signed)
  Subjective:    Patient ID: [name], male    DOB: 1945/07/20, 66 y.o.   MRN: [id number]  HPI 65/M, ex smoker with metastatic adenoCA diagnosed in sep '10 s/p chemo & RT to mediastinum last 10/10  He is on maintenance chemo alimta & avastin  He reports DOE x 1 yr , progressive, eg on climbing stairs, cannot swing a golf club  C/o occ sinus congestion, no heartburn on omeprazole, no wheeze  Spirometry showed mild obstruction but preserved FEv1 at 97%   05/06/2011 4 mnth FU for evaluation of pulmonary infx  CT chest 2/13 showed Multiple new ground-glass nodules in the lower lobes - not seen in 11/12. The rapid onset of these lesions suggest infectious or inflammatory etiology. Cannot exclude multifocal adenocarcinoma although this is felt less likely. Stable radiation change in the right upper lobe.  >>symptoms & CXR improved BL infiltrates on avelox & prednisone   He opted out of pulm rehab  Dyspnea persists  07/07/2011 CT chest 3/29 showed resolution of infiltrates  Remains short of breath on minimal exertion, could feel it when he stopped prednisone x 1 day dropped to 10mg  on 3/15 Now on 4 wkly chemo No heartburn or sinus drainage, wt remains low Spirometry >> mild airway obstruction, FEv1 93%    Review of Systems Patient denies significant dyspnea,cough, hemoptysis,  chest pain, palpitations, pedal edema, orthopnea, paroxysmal nocturnal dyspnea, lightheadedness, nausea, vomiting, abdominal or  leg pains      Objective:   Physical Exam  Gen. Pleasant, well-nourished, in no distress ENT - no lesions, no post nasal drip Neck: No JVD, no thyromegaly, no carotid bruits Lungs: no use of accessory muscles, no dullness to percussion, decreased without rales or rhonchi  Cardiovascular: Rhythm regular, heart sounds  normal, no murmurs or gallops, no peripheral edema Musculoskeletal: No deformities, no cyanosis or clubbing        Assessment & Plan:

## 2011-07-07 NOTE — Assessment & Plan Note (Signed)
Favor infectious vs inflammatory given acute appearance within 2 months & resolution with prednisone Drop prednisone to 5 mg in April & alternate day in May Dyspnea seems tobe out of proportion to degree of airway obstruction on spirometry >>Trial of symbicort 160 2 puffs twice daily Use albuterol as needed only May have to consider alternative etiologies if persistent, deconditioning also contributing

## 2011-07-07 NOTE — Progress Notes (Signed)
Addended by: Darrell Jewel on: 07/07/2011 02:34 PM   Modules accepted: Orders

## 2011-07-07 NOTE — Patient Instructions (Signed)
Trial of symbicort 160 2 puffs twice daily Use albuterol as needed only Drop prednisone to 5 mg in April & alternate day in May

## 2011-07-07 NOTE — Progress Notes (Deleted)
Patient ID: [name], male   DOB: Jul 25, 1945, 66 y.o.   MRN: [id number]

## 2011-07-08 ENCOUNTER — Ambulatory Visit: Payer: Medicare Other | Admitting: Pulmonary Disease

## 2011-07-21 ENCOUNTER — Ambulatory Visit: Payer: Medicare Other

## 2011-07-21 ENCOUNTER — Other Ambulatory Visit: Payer: Medicare Other | Admitting: Lab

## 2011-07-28 ENCOUNTER — Ambulatory Visit (HOSPITAL_BASED_OUTPATIENT_CLINIC_OR_DEPARTMENT_OTHER): Payer: Medicare Other | Admitting: Physician Assistant

## 2011-07-28 ENCOUNTER — Encounter: Payer: Self-pay | Admitting: Physician Assistant

## 2011-07-28 ENCOUNTER — Other Ambulatory Visit (HOSPITAL_BASED_OUTPATIENT_CLINIC_OR_DEPARTMENT_OTHER): Payer: Medicare Other | Admitting: Lab

## 2011-07-28 ENCOUNTER — Telehealth: Payer: Self-pay | Admitting: Internal Medicine

## 2011-07-28 ENCOUNTER — Ambulatory Visit (HOSPITAL_BASED_OUTPATIENT_CLINIC_OR_DEPARTMENT_OTHER): Payer: Medicare Other

## 2011-07-28 VITALS — BP 136/86 | HR 68 | Temp 97.8°F | Ht 68.0 in | Wt 141.2 lb

## 2011-07-28 DIAGNOSIS — C3410 Malignant neoplasm of upper lobe, unspecified bronchus or lung: Secondary | ICD-10-CM

## 2011-07-28 DIAGNOSIS — C3490 Malignant neoplasm of unspecified part of unspecified bronchus or lung: Secondary | ICD-10-CM

## 2011-07-28 DIAGNOSIS — Z5111 Encounter for antineoplastic chemotherapy: Secondary | ICD-10-CM

## 2011-07-28 LAB — CBC WITH DIFFERENTIAL/PLATELET
BASO%: 0 % (ref 0.0–2.0)
HCT: 33.5 % — ABNORMAL LOW (ref 38.4–49.9)
LYMPH%: 8.6 % — ABNORMAL LOW (ref 14.0–49.0)
MCH: 35.9 pg — ABNORMAL HIGH (ref 27.2–33.4)
MCHC: 34.3 g/dL (ref 32.0–36.0)
MCV: 104.7 fL — ABNORMAL HIGH (ref 79.3–98.0)
MONO#: 0.6 10*3/uL (ref 0.1–0.9)
MONO%: 6.6 % (ref 0.0–14.0)
NEUT%: 84.7 % — ABNORMAL HIGH (ref 39.0–75.0)
Platelets: 168 10*3/uL (ref 140–400)
RBC: 3.2 10*6/uL — ABNORMAL LOW (ref 4.20–5.82)
WBC: 8.6 10*3/uL (ref 4.0–10.3)

## 2011-07-28 LAB — COMPREHENSIVE METABOLIC PANEL
AST: 26 U/L (ref 0–37)
BUN: 27 mg/dL — ABNORMAL HIGH (ref 6–23)
Calcium: 9.8 mg/dL (ref 8.4–10.5)
Chloride: 98 mEq/L (ref 96–112)
Creatinine, Ser: 1.29 mg/dL (ref 0.50–1.35)
Total Bilirubin: 0.5 mg/dL (ref 0.3–1.2)

## 2011-07-28 LAB — UA PROTEIN, DIPSTICK - CHCC: Protein, ur: 30 mg/dL

## 2011-07-28 MED ORDER — SODIUM CHLORIDE 0.9 % IJ SOLN
10.0000 mL | INTRAMUSCULAR | Status: DC | PRN
  Administered 2011-07-28: 10 mL
  Filled 2011-07-28: qty 10

## 2011-07-28 MED ORDER — SODIUM CHLORIDE 0.9 % IV SOLN
Freq: Once | INTRAVENOUS | Status: AC
  Administered 2011-07-28: 10:00:00 via INTRAVENOUS

## 2011-07-28 MED ORDER — PEMETREXED DISODIUM CHEMO INJECTION 500 MG
500.0000 mg/m2 | Freq: Once | INTRAVENOUS | Status: AC
  Administered 2011-07-28: 875 mg via INTRAVENOUS
  Filled 2011-07-28: qty 35

## 2011-07-28 MED ORDER — HEPARIN SOD (PORK) LOCK FLUSH 100 UNIT/ML IV SOLN
500.0000 [IU] | Freq: Once | INTRAVENOUS | Status: AC | PRN
  Administered 2011-07-28: 500 [IU]
  Filled 2011-07-28: qty 5

## 2011-07-28 MED ORDER — ONDANSETRON 8 MG/50ML IVPB (CHCC)
8.0000 mg | Freq: Once | INTRAVENOUS | Status: AC
  Administered 2011-07-28: 8 mg via INTRAVENOUS

## 2011-07-28 MED ORDER — DEXAMETHASONE SODIUM PHOSPHATE 10 MG/ML IJ SOLN
10.0000 mg | Freq: Once | INTRAMUSCULAR | Status: AC
  Administered 2011-07-28: 10 mg via INTRAVENOUS

## 2011-07-28 MED ORDER — SODIUM CHLORIDE 0.9 % IV SOLN
15.0000 mg/kg | Freq: Once | INTRAVENOUS | Status: AC
  Administered 2011-07-28: 975 mg via INTRAVENOUS
  Filled 2011-07-28: qty 39

## 2011-07-28 NOTE — Telephone Encounter (Signed)
gv pt appt for [id number] 

## 2011-07-28 NOTE — Patient Instructions (Signed)
[facility] Discharge Instructions for Patients Receiving Chemotherapy  Today you received the following chemotherapy agents; Avastin and Alimta  To help prevent nausea and vomiting after your treatment, we encourage you to take your nausea medication.  Begin taking it as often as prescribed by your physician.    If you develop nausea and vomiting that is not controlled by your nausea medication, call the clinic at [phone]. If it is after clinic hours your family physician or the after hours number for the clinic or go to the Emergency Department.   BELOW ARE SYMPTOMS THAT SHOULD BE REPORTED IMMEDIATELY:  *FEVER GREATER THAN 100.5 F  *CHILLS WITH OR WITHOUT FEVER  NAUSEA AND VOMITING THAT IS NOT CONTROLLED WITH YOUR NAUSEA MEDICATION  *UNUSUAL SHORTNESS OF BREATH  *UNUSUAL BRUISING OR BLEEDING  TENDERNESS IN MOUTH AND THROAT WITH OR WITHOUT PRESENCE OF ULCERS  *URINARY PROBLEMS  *BOWEL PROBLEMS  UNUSUAL RASH Items with * indicate a potential emergency and should be followed up as soon as possible.  One of the nurses will contact you 24 hours after your treatment. Please let the nurse know about any problems that you may have experienced. Feel free to call the clinic you have any questions or concerns. The clinic phone number is [phone].   I have been informed and understand all the instructions given to me. I know to contact the clinic, my physician, or go to the Emergency Department if any problems should occur. I do not have any questions at this time, but understand that I may call the clinic during office hours   should I have any questions or need assistance in obtaining follow up care.    __________________________________________  _____________  __________ Signature of Patient or Authorized Representative            Date                   Time    __________________________________________ Nurse's Signature

## 2011-07-29 NOTE — Progress Notes (Signed)
[facility] Telephone:([phone]   [phone]  OFFICE PROGRESS NOTE  [name], MD, MD [address] [place] [address]  DIAGNOSIS: Metastatic non-small cell lung cancer adenocarcinoma diagnosed in September 2010   PRIOR THERAPY:  #1 status post 6 cycles of systemic chemotherapy with carboplatin, Alimta and Avastin given every 3 weeks last dose given 04/23/2009 with disease stabilization.  #2 status post palliative radiotherapy to the mediastinum under the care of Dr. Michell Heinrich. The patient received a total dose of 3000 cGY and 12 fractions completed 01/21/2009.  #3 Maintenance chemotherapy with Alimta at 500 mg per meter squared and Avastin at 15 mg per kilogram given every 3 weeks status post 35 cycles.  CURRENT THERAPY:f maintenance chemotherapy with Alimta 500 mg/M2 and Avastin 15 mg/kg every 4 weeks,status post 1 cycle.   INTERVAL HISTORY: Jaeveon Ashland 66 y.o. male returns to the clinic today for followup visit. He is tolerating his maintenance chemotherapy well and much prefers the every 4 week interval. He does report some occasional tremors in his hands. He is currently on a steroid taper that was prescribed by Dr. Vassie Loll. He discuss these tremors with his primary care physician who felt like they were a result of the steroids. He reports that he has the beginnings of a cataract in his right eye but his doctors keeping an close watch on this. He hurt his right knee as he was turning towards the door. His primary care physician as evaluated as well and is keeping a watch on its resolution. He voices no other complaints his does not requiring prescription refills today. The patient denied having any significant chest pain, no weight loss or night sweats.   MEDICAL HISTORY: Past Medical History  Diagnosis Date  . Hypertension   . Arthritis   . Syncope and collapse   . Lung cancer     lung ca dx 11/2008    ALLERGIES:  is allergic to  percocet and zolpidem tartrate.  MEDICATIONS:  Current Outpatient Prescriptions  Medication Sig Dispense Refill  . amLODipine (NORVASC) 2.5 MG tablet Take 2.5 mg by mouth daily.        Marland Kitchen atenolol-chlorthalidone (TENORETIC) 50-25 MG per tablet Take 1 tablet by mouth Daily.      . B Complex-Biotin-FA (B-COMPLEX PO) Take by mouth. 1500mg  daily       . dexamethasone (DECADRON) 4 MG tablet Take 4 mg by mouth. 1 days before, day of, and after chemotherapy      . doxazosin (CARDURA) 2 MG tablet Take 2 mg by mouth at bedtime.        . FeFum-FePoly-FA-B Cmp-C-Biot (FOLIVANE-PLUS) CAPS Take 1 capsule by mouth daily.  90 capsule  1  . fexofenadine (ALLEGRA) 180 MG tablet Take 180 mg by mouth daily.        . folic acid (FOLVITE) 1 MG tablet Take 1 tablet (1 mg total) by mouth daily.  90 tablet  3  . HYDROcodone-homatropine (HYCODAN) 5-1.5 MG/5ML syrup Take 5 mLs by mouth every 6 (six) hours as needed.      Marland Kitchen ibuprofen (ADVIL,MOTRIN) 200 MG tablet Take 200 mg by mouth every 6 (six) hours as needed.        . Multiple Vitamin (MULTIVITAMIN) tablet Take 1 tablet by mouth daily.        Marland Kitchen omeprazole (PRILOSEC) 40 MG capsule Take 1 capsule (40 mg total) by mouth daily.  90 capsule  1  .  predniSONE (DELTASONE) 10 MG tablet Take 5 mg by mouth daily. As directed.      . prochlorperazine (COMPAZINE) 10 MG tablet Take 10 mg by mouth every 6 (six) hours as needed.        Bernadette Hoit Sodium (SENOKOT S PO) Take by mouth. 3 tabs daily        No current facility-administered medications for this visit.   Facility-Administered Medications Ordered in Other Visits  Medication Dose Route Frequency Provider Last Rate Last Dose  . 0.9 %  sodium chloride infusion   Intravenous Once Si Gaul, MD      . bevacizumab (AVASTIN) 975 mg in sodium chloride 0.9 % 100 mL chemo infusion  15 mg/kg (Treatment Plan Actual) Intravenous Once Si Gaul, MD   975 mg at 07/28/11 1056  . dexamethasone (DECADRON) injection  10 mg  10 mg Intravenous Once Si Gaul, MD   10 mg at 07/28/11 1027  . heparin lock flush 100 unit/mL  500 Units Intracatheter Once PRN Si Gaul, MD   500 Units at 07/28/11 1153  . ondansetron (ZOFRAN) IVPB 8 mg  8 mg Intravenous Once Si Gaul, MD   8 mg at 07/28/11 1027  . PEMEtrexed (ALIMTA) 875 mg in sodium chloride 0.9 % 100 mL chemo infusion  500 mg/m2 (Treatment Plan Actual) Intravenous Once Si Gaul, MD   875 mg at 07/28/11 1139  . DISCONTD: sodium chloride 0.9 % injection 10 mL  10 mL Intracatheter PRN Si Gaul, MD   10 mL at 07/28/11 1153    SURGICAL HISTORY:  Past Surgical History  Procedure Date  . 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 Rotator cuff repair     REVIEW OF SYSTEMS:  Pertinent items are noted in HPI.   PHYSICAL EXAMINATION: General appearance: alert, cooperative and no distress Head: Normocephalic, without obvious abnormality, atraumatic Neck: no adenopathy Lymph nodes: Cervical, supraclavicular, and axillary nodes normal. Resp: clear to auscultation bilaterally Cardio: regular rate and rhythm, S1, S2 normal, no murmur, click, rub or gallop GI: soft, non-tender; bowel sounds normal; no masses,  no organomegaly Extremities: extremities normal, atraumatic, no cyanosis or edema Neurologic: Alert and oriented X 3, normal strength and tone. Normal symmetric reflexes. Normal coordination and gait  ECOG PERFORMANCE STATUS: 1 - Symptomatic but completely ambulatory  Blood pressure 136/86, pulse 68, temperature 97.8 F (36.6 C), temperature source Oral, height 5\' 8"  (1.727 m), weight 141 lb 3.2 oz (64.048 kg).  LABORATORY DATA: Lab Results  Component Value Date   WBC 8.6 07/28/2011   HGB 11.5* 07/28/2011   HCT 33.5* 07/28/2011   MCV 104.7* 07/28/2011   PLT 168 07/28/2011      Chemistry      Component Value Date/Time   NA 132* 07/28/2011 0856   K 4.3 07/28/2011 0856   CL 98 07/28/2011 0856   CO2 22 07/28/2011 0856    BUN 27* 07/28/2011 0856   CREATININE 1.29 07/28/2011 0856      Component Value Date/Time   CALCIUM 9.8 07/28/2011 0856   ALKPHOS 70 07/28/2011 0856   AST 26 07/28/2011 0856   ALT 23 07/28/2011 0856   BILITOT 0.5 07/28/2011 0856       RADIOGRAPHIC STUDIES: Ct Chest W Contrast  06/25/2011  *RADIOLOGY REPORT*  Clinical Data:  Lung cancer diagnosed 11/2008, chemotherapy ongoing, XRT completed in 02/2009.  CT CHEST, ABDOMEN AND PELVIS WITH CONTRAST  Technique:  Multidetector CT imaging of the chest, abdomen  and pelvis was performed following the standard protocol during bolus administration of intravenous contrast.  Contrast:  100 ml Omnipaque-300 IV  Comparison:  04/30/2011   CT CHEST  Findings:  Stable right paramediastinal radiation changes.  Stable suspected pleural parenchymal nodularity in the left lung apex (series 5/image 11).  Prior right middle lobe and left lower lobe nodular opacities have resolved.  Mild residual ground-glass opacity in the posterior right lower lobe and lingula, improved, likely infectious/inflammatory.  Underlying mild subpleural reticulation/paraseptal emphysematous changes in the lower lobes.  No new/suspicious pulmonary nodules. No pleural effusion or pneumothorax.  Visualized thyroid is unremarkable.  The heart is normal in size.  No pericardial effusion.   Coronary atherosclerosis.  Atherosclerotic calcifications of the aortic arch.  Right chest port.  Small mediastinal lymph nodes which do not meet pathologic CT size criteria.  Degenerative changes of the thoracic spine.  IMPRESSION: Stable right paramediastinal radiation changes.  No evidence of recurrent or metastatic disease in the chest.  Near complete resolution of prior lower lobe predominant nodular ground-glass opacities, likely infectious/inflammatory.   CT ABDOMEN AND PELVIS  Findings:  Liver, spleen, pancreas, and adrenal glands are within normal limits.  Gallbladder is unremarkable.  No intrahepatic or extrahepatic  ductal dilatation.  1.4 cm left upper pole cysts.  Right kidney is unremarkable.  No hydronephrosis.  No evidence of bowel obstruction.  Atherosclerotic calcifications of the abdominal aorta and branch vessels.  No abdominopelvic ascites.  Small retroperitoneal lymph nodes which do not meet pathologic CT size criteria.  No suspicious abdominopelvic lymphadenopathy.  Prostate is unremarkable.  Bladder is within normal limits.  Mild degenerative changes of the lumbar spine.  IMPRESSION: No evidence of metastatic disease in the abdomen/pelvis.  Original Report Authenticated By: Charline Bills, M.D.    ASSESSMENT/PLAN: This is a very pleasant 66 years old white male with metastatic non-small cell lung cancer , adenocarcinoma. The patient is currently on maintenance chemotherapy with Alimta and Avastin, now every 4 weeks. He is doing fine and tolerating his treatment well with less fatigue now that his chemotherapy is every 4 weeks.. The recent CT scan of the chest abdomen and pelvis showed no evidence for disease progression. The patient was discussed with Dr. Arbutus Ped. He will proceed with his scheduled cycle of maintenance chemotherapy with Alimta and Avastin. He'll return in 4 weeks with repeat CBC differential C. met and urine protein dipstick prior to his next scheduled cycle of maintenance chemotherapy with Alimta and Avastin.  He is monitoring scans will be done every three-month.  Laural Benes, Zamiah Tollett E, PA-C   All questions were answered. The patient knows to call the clinic with any problems, questions or concerns. We can certainly see the patient much sooner if necessary.

## 2011-08-04 ENCOUNTER — Ambulatory Visit: Payer: Medicare Other | Admitting: Physician Assistant

## 2011-08-04 ENCOUNTER — Other Ambulatory Visit: Payer: Medicare Other | Admitting: Lab

## 2011-08-11 ENCOUNTER — Ambulatory Visit: Payer: Medicare Other

## 2011-08-11 ENCOUNTER — Other Ambulatory Visit: Payer: Medicare Other | Admitting: Lab

## 2011-08-25 ENCOUNTER — Ambulatory Visit (HOSPITAL_BASED_OUTPATIENT_CLINIC_OR_DEPARTMENT_OTHER): Payer: Medicare Other | Admitting: Physician Assistant

## 2011-08-25 ENCOUNTER — Other Ambulatory Visit (HOSPITAL_BASED_OUTPATIENT_CLINIC_OR_DEPARTMENT_OTHER): Payer: Medicare Other

## 2011-08-25 ENCOUNTER — Other Ambulatory Visit: Payer: Medicare Other | Admitting: Lab

## 2011-08-25 ENCOUNTER — Telehealth: Payer: Self-pay | Admitting: Internal Medicine

## 2011-08-25 ENCOUNTER — Ambulatory Visit (HOSPITAL_BASED_OUTPATIENT_CLINIC_OR_DEPARTMENT_OTHER): Payer: Medicare Other

## 2011-08-25 ENCOUNTER — Ambulatory Visit: Payer: Medicare Other

## 2011-08-25 VITALS — BP 146/95 | HR 77 | Temp 97.1°F | Ht 68.0 in | Wt 142.9 lb

## 2011-08-25 DIAGNOSIS — C3410 Malignant neoplasm of upper lobe, unspecified bronchus or lung: Secondary | ICD-10-CM

## 2011-08-25 DIAGNOSIS — M25569 Pain in unspecified knee: Secondary | ICD-10-CM

## 2011-08-25 DIAGNOSIS — Z5112 Encounter for antineoplastic immunotherapy: Secondary | ICD-10-CM

## 2011-08-25 DIAGNOSIS — Z5111 Encounter for antineoplastic chemotherapy: Secondary | ICD-10-CM

## 2011-08-25 LAB — COMPREHENSIVE METABOLIC PANEL
Albumin: 4.2 g/dL (ref 3.5–5.2)
BUN: 21 mg/dL (ref 6–23)
CO2: 25 mEq/L (ref 19–32)
Calcium: 9.1 mg/dL (ref 8.4–10.5)
Chloride: 102 mEq/L (ref 96–112)
Glucose, Bld: 116 mg/dL — ABNORMAL HIGH (ref 70–99)
Potassium: 3.9 mEq/L (ref 3.5–5.3)

## 2011-08-25 LAB — CBC WITH DIFFERENTIAL/PLATELET
Basophils Absolute: 0 10*3/uL (ref 0.0–0.1)
Eosinophils Absolute: 0.1 10*3/uL (ref 0.0–0.5)
HCT: 34.2 % — ABNORMAL LOW (ref 38.4–49.9)
HGB: 11.7 g/dL — ABNORMAL LOW (ref 13.0–17.1)
NEUT#: 3.7 10*3/uL (ref 1.5–6.5)
RDW: 15 % — ABNORMAL HIGH (ref 11.0–14.6)
lymph#: 1.4 10*3/uL (ref 0.9–3.3)

## 2011-08-25 LAB — UA PROTEIN, DIPSTICK - CHCC: Protein, ur: <30 mg/dL

## 2011-08-25 MED ORDER — FOLIVANE-PLUS PO CAPS: 1.0000 | ORAL_CAPSULE | Freq: Every day | ORAL | Status: DC

## 2011-08-25 MED ORDER — ONDANSETRON 8 MG/50ML IVPB (CHCC)
8.0000 mg | Freq: Once | INTRAVENOUS | Status: AC
  Administered 2011-08-25: 8 mg via INTRAVENOUS

## 2011-08-25 MED ORDER — DEXAMETHASONE SODIUM PHOSPHATE 10 MG/ML IJ SOLN
10.0000 mg | Freq: Once | INTRAMUSCULAR | Status: AC
  Administered 2011-08-25: 10 mg via INTRAVENOUS

## 2011-08-25 MED ORDER — SODIUM CHLORIDE 0.9 % IJ SOLN
10.0000 mL | INTRAMUSCULAR | Status: DC | PRN
  Administered 2011-08-25: 10 mL
  Filled 2011-08-25: qty 10

## 2011-08-25 MED ORDER — PEMETREXED DISODIUM CHEMO INJECTION 500 MG
500.0000 mg/m2 | Freq: Once | INTRAVENOUS | Status: AC
  Administered 2011-08-25: 875 mg via INTRAVENOUS
  Filled 2011-08-25: qty 35

## 2011-08-25 MED ORDER — HEPARIN SOD (PORK) LOCK FLUSH 100 UNIT/ML IV SOLN
500.0000 [IU] | Freq: Once | INTRAVENOUS | Status: AC | PRN
  Administered 2011-08-25: 500 [IU]
  Filled 2011-08-25: qty 5

## 2011-08-25 MED ORDER — SODIUM CHLORIDE 0.9 % IV SOLN
Freq: Once | INTRAVENOUS | Status: AC
  Administered 2011-08-25: 11:00:00 via INTRAVENOUS

## 2011-08-25 MED ORDER — CYANOCOBALAMIN 1000 MCG/ML IJ SOLN
1000.0000 ug | Freq: Once | INTRAMUSCULAR | Status: AC
  Administered 2011-08-25: 1000 ug via INTRAMUSCULAR

## 2011-08-25 MED ORDER — SODIUM CHLORIDE 0.9 % IV SOLN
15.0000 mg/kg | Freq: Once | INTRAVENOUS | Status: AC
  Administered 2011-08-25: 975 mg via INTRAVENOUS
  Filled 2011-08-25: qty 39

## 2011-08-25 NOTE — Patient Instructions (Signed)
[facility] Discharge Instructions for Patients Receiving Chemotherapy  Today you received the following chemotherapy agents avastin/alimta  To help prevent nausea and vomiting after your treatment, we encourage you to take your nausea medicatio and take it as often as prescribed   If you develop nausea and vomiting that is not controlled by your nausea medication, call the clinic. If it is after clinic hours your family physician or the after hours number for the clinic or go to the Emergency Department.   BELOW ARE SYMPTOMS THAT SHOULD BE REPORTED IMMEDIATELY:  *FEVER GREATER THAN 100.5 F  *CHILLS WITH OR WITHOUT FEVER  NAUSEA AND VOMITING THAT IS NOT CONTROLLED WITH YOUR NAUSEA MEDICATION  *UNUSUAL SHORTNESS OF BREATH  *UNUSUAL BRUISING OR BLEEDING  TENDERNESS IN MOUTH AND THROAT WITH OR WITHOUT PRESENCE OF ULCERS  *URINARY PROBLEMS  *BOWEL PROBLEMS  UNUSUAL RASH Items with * indicate a potential emergency and should be followed up as soon as possible.  One of the nurses will contact you 24 hours after your treatment. Please let the nurse know about any problems that you may have experienced. Feel free to call the clinic you have any questions or concerns. The clinic phone number is [phone].   I have been informed and understand all the instructions given to me. I know to contact the clinic, my physician, or go to the Emergency Department if any problems should occur. I do not have any questions at this time, but understand that I may call the clinic during office hours   should I have any questions or need assistance in obtaining follow up care.    __________________________________________  _____________  __________ Signature of Patient or Authorized Representative            Date                   Time    __________________________________________ Nurse's Signature

## 2011-08-25 NOTE — Progress Notes (Signed)
[facility] Telephone:([phone]   Fax:([phone]  OFFICE PROGRESS NOTE  [name], MD, MD [address] [place] [address]  DIAGNOSIS: Metastatic non-small cell lung cancer adenocarcinoma diagnosed in September 2010   PRIOR THERAPY:  #1 status post 6 cycles of systemic chemotherapy with carboplatin, Alimta and Avastin given every 3 weeks last dose given 04/23/2009 with disease stabilization.  #2 status post palliative radiotherapy to the mediastinum under the care of Dr. Michell Heinrich. The patient received a total dose of 3000 cGY and 12 fractions completed 01/21/2009.  #3 Maintenance chemotherapy with Alimta at 500 mg per meter squared and Avastin at 15 mg per kilogram given every 3 weeks status post 35 cycles.  CURRENT THERAPY:f maintenance chemotherapy with Alimta 500 mg/M2 and Avastin 15 mg/kg every 4 weeks,status post 2 cycles.   INTERVAL HISTORY: [name] 66 y.o. male returns to the clinic today for followup visit accompanied by his wife. He complains of right knee pain. He began in April when he was reaching for doorknob. He states that the knee just popped. Again on Saturday when he was shifting his weight he states that the popped again. He has an appointment on Saturday at [facility] to see about the knee. He and his wife are going to [place] for a week next Friday to bury his mother. Regarding his lung cancer he is generally feeling well. He is reports that he is breathing better. His prednisone taper is now down to 5 mg every other day and he has a followup appointment with Dr. Vassie Loll next week. He has not had any bleeding or bruising. Blood pressure is generally been under good control.Marland Kitchen He is tolerating his maintenance chemotherapy well and much prefers the every 4 week interval.  turning towards the door. The patient denied having any significant chest pain, no weight loss or night sweats. He requests a refill for his  folding plus. He gets a 90 day supply from his mail order pharmacy.  MEDICAL HISTORY: Past Medical History  Diagnosis Date  . Hypertension   . Arthritis   . Syncope and collapse   . Lung cancer     lung ca dx 11/2008    ALLERGIES:  is allergic to percocet and zolpidem tartrate.  MEDICATIONS:  Current Outpatient Prescriptions  Medication Sig Dispense Refill  . amLODipine (NORVASC) 2.5 MG tablet Take 2.5 mg by mouth daily.        Marland Kitchen atenolol-chlorthalidone (TENORETIC) 50-25 MG per tablet Take 1 tablet by mouth Daily.      . B Complex-Biotin-FA (B-COMPLEX PO) Take by mouth. 1500mg  daily       . dexamethasone (DECADRON) 4 MG tablet Take 4 mg by mouth. 1 days before, day of, and after chemotherapy      . doxazosin (CARDURA) 2 MG tablet Take 2 mg by mouth at bedtime.        . FeFum-FePoly-FA-B Cmp-C-Biot (FOLIVANE-PLUS) CAPS Take 1 capsule by mouth daily.  90 capsule  3  . fexofenadine (ALLEGRA) 180 MG tablet Take 180 mg by mouth daily.        . folic acid (FOLVITE) 1 MG tablet Take 1 tablet (1 mg total) by mouth daily.  90 tablet  3  . HYDROcodone-homatropine (HYCODAN) 5-1.5 MG/5ML syrup Take 5 mLs by mouth every 6 (six) hours as needed.      Marland Kitchen ibuprofen (ADVIL,MOTRIN) 200 MG tablet Take 200 mg by mouth every 6 (six) hours as  needed.        . Multiple Vitamin (MULTIVITAMIN) tablet Take 1 tablet by mouth daily.        Marland Kitchen omeprazole (PRILOSEC) 40 MG capsule Take 1 capsule (40 mg total) by mouth daily.  90 capsule  1  . predniSONE (DELTASONE) 10 MG tablet Take 5 mg by mouth daily. As directed.      . prochlorperazine (COMPAZINE) 10 MG tablet Take 10 mg by mouth every 6 (six) hours as needed.        Bernadette Hoit Sodium (SENOKOT S PO) Take by mouth. 3 tabs daily        No current facility-administered medications for this visit.   Facility-Administered Medications Ordered in Other Visits  Medication Dose Route Frequency Provider Last Rate Last Dose  . 0.9 %  sodium chloride infusion    Intravenous Once Si Gaul, MD      . bevacizumab (AVASTIN) 975 mg in sodium chloride 0.9 % 100 mL chemo infusion  15 mg/kg (Treatment Plan Actual) Intravenous Once Si Gaul, MD   975 mg at 08/25/11 1205  . cyanocobalamin ((VITAMIN B-12)) injection 1,000 mcg  1,000 mcg Intramuscular Once Si Gaul, MD   1,000 mcg at 08/25/11 1156  . dexamethasone (DECADRON) injection 10 mg  10 mg Intravenous Once Si Gaul, MD   10 mg at 08/25/11 1124  . heparin lock flush 100 unit/mL  500 Units Intracatheter Once PRN Si Gaul, MD   500 Units at 08/25/11 1236  . ondansetron (ZOFRAN) IVPB 8 mg  8 mg Intravenous Once Si Gaul, MD   8 mg at 08/25/11 1124  . PEMEtrexed (ALIMTA) 875 mg in sodium chloride 0.9 % 100 mL chemo infusion  500 mg/m2 (Treatment Plan Actual) Intravenous Once Si Gaul, MD   875 mg at 08/25/11 1151  . DISCONTD: sodium chloride 0.9 % injection 10 mL  10 mL Intracatheter PRN Si Gaul, MD   10 mL at 08/25/11 1236    SURGICAL HISTORY:  Past Surgical History  Procedure Date  . 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 Rotator cuff repair     REVIEW OF SYSTEMS:  Pertinent items are noted in HPI.   PHYSICAL EXAMINATION: General appearance: alert, cooperative and no distress Head: Normocephalic, without obvious abnormality, atraumatic Neck: no adenopathy Lymph nodes: Cervical, supraclavicular, and axillary nodes normal. Resp: clear to auscultation bilaterally Cardio: regular rate and rhythm, S1, S2 normal, no murmur, click, rub or gallop GI: soft, non-tender; bowel sounds normal; no masses,  no organomegaly Extremities: extremities normal, atraumatic, no cyanosis or edema Neurologic: Alert and oriented X 3, normal strength and tone. Normal symmetric reflexes. Normal coordination and gait  ECOG PERFORMANCE STATUS: 1 - Symptomatic but completely ambulatory  Blood pressure 146/95, pulse 77, temperature 97.1 F (36.2 C),  temperature source Oral, height 5\' 8"  (1.727 m), weight 142 lb 14.4 oz (64.819 kg).  LABORATORY DATA: Lab Results  Component Value Date   WBC 5.9 08/25/2011   HGB 11.7* 08/25/2011   HCT 34.2* 08/25/2011   MCV 104.3* 08/25/2011   PLT 156 08/25/2011      Chemistry      Component Value Date/Time   NA 137 08/25/2011 0916   K 3.9 08/25/2011 0916   CL 102 08/25/2011 0916   CO2 25 08/25/2011 0916   BUN 21 08/25/2011 0916   CREATININE 1.28 08/25/2011 0916      Component Value Date/Time   CALCIUM 9.1 08/25/2011 0916  ALKPHOS 71 08/25/2011 0916   AST 26 08/25/2011 0916   ALT 19 08/25/2011 0916   BILITOT 0.4 08/25/2011 0916       RADIOGRAPHIC STUDIES: Ct Chest W Contrast  06/25/2011  *RADIOLOGY REPORT*  Clinical Data:  Lung cancer diagnosed 11/2008, chemotherapy ongoing, XRT completed in 02/2009.  CT CHEST, ABDOMEN AND PELVIS WITH CONTRAST  Technique:  Multidetector CT imaging of the chest, abdomen and pelvis was performed following the standard protocol during bolus administration of intravenous contrast.  Contrast:  100 ml Omnipaque-300 IV  Comparison:  04/30/2011   CT CHEST  Findings:  Stable right paramediastinal radiation changes.  Stable suspected pleural parenchymal nodularity in the left lung apex (series 5/image 11).  Prior right middle lobe and left lower lobe nodular opacities have resolved.  Mild residual ground-glass opacity in the posterior right lower lobe and lingula, improved, likely infectious/inflammatory.  Underlying mild subpleural reticulation/paraseptal emphysematous changes in the lower lobes.  No new/suspicious pulmonary nodules. No pleural effusion or pneumothorax.  Visualized thyroid is unremarkable.  The heart is normal in size.  No pericardial effusion.   Coronary atherosclerosis.  Atherosclerotic calcifications of the aortic arch.  Right chest port.  Small mediastinal lymph nodes which do not meet pathologic CT size criteria.  Degenerative changes of the thoracic spine.   IMPRESSION: Stable right paramediastinal radiation changes.  No evidence of recurrent or metastatic disease in the chest.  Near complete resolution of prior lower lobe predominant nodular ground-glass opacities, likely infectious/inflammatory.   CT ABDOMEN AND PELVIS  Findings:  Liver, spleen, pancreas, and adrenal glands are within normal limits.  Gallbladder is unremarkable.  No intrahepatic or extrahepatic ductal dilatation.  1.4 cm left upper pole cysts.  Right kidney is unremarkable.  No hydronephrosis.  No evidence of bowel obstruction.  Atherosclerotic calcifications of the abdominal aorta and branch vessels.  No abdominopelvic ascites.  Small retroperitoneal lymph nodes which do not meet pathologic CT size criteria.  No suspicious abdominopelvic lymphadenopathy.  Prostate is unremarkable.  Bladder is within normal limits.  Mild degenerative changes of the lumbar spine.  IMPRESSION: No evidence of metastatic disease in the abdomen/pelvis.  Original Report Authenticated By: Charline Bills, M.D.    ASSESSMENT/PLAN: This is a very pleasant 66 years old white male with metastatic non-small cell lung cancer , adenocarcinoma. The patient is currently on maintenance chemotherapy with Alimta and Avastin, now every 4 weeks.  The patient was discussed with Dr. Arbutus Ped. He will proceed with his scheduled cycle of maintenance chemotherapy with Alimta and Avastin.Should he need knee surgery we will hold his next cycle of Avastin. He'll return in 4 weeks with repeat CBC differential C. met and urine protein dipstick as well as as restaging CT of the chest , abdomen and pelvis with contrast to re-evaluate his disease prior to his next scheduled cycle of maintenance chemotherapy with Alimta and Avastin.  His monitoring CT scans will be done every three-months.  Laural Benes, Leith Szafranski E, PA-C   All questions were answered. The patient knows to call the clinic with any problems, questions or concerns. We can certainly  see the patient much sooner if necessary.

## 2011-08-25 NOTE — Telephone Encounter (Signed)
Gv pt appt for june2013.  scheduled pt for ct scan on 06/24 @ [facility]

## 2011-08-26 ENCOUNTER — Telehealth: Payer: Self-pay | Admitting: Pulmonary Disease

## 2011-08-26 NOTE — Telephone Encounter (Signed)
Decrease to 5 mg every mon/fri in June Ok to stop in july

## 2011-08-26 NOTE — Telephone Encounter (Signed)
PT RETURNED CALL. HE ASKS THAT NURSE NOW CALL HIM ON HIS CELL [phone]. Hazel Sams

## 2011-08-26 NOTE — Telephone Encounter (Signed)
lmomtcb x1 for pt 

## 2011-08-26 NOTE — Telephone Encounter (Signed)
Patient returning call.

## 2011-08-26 NOTE — Telephone Encounter (Signed)
Spoke with pt. He states that he has been taking 5 mg prednisone every other day since last visit in April. He had to reschedule June appt and not scheduled to see RA again until mid July. He is asking if should go ahead and stop pred since the cough is gone, or should he continue to take the 5 mg every other day until next appt. Please advise, thanks!

## 2011-08-26 NOTE — Telephone Encounter (Signed)
lmomtcb x1 

## 2011-08-26 NOTE — Telephone Encounter (Signed)
LMTCB

## 2011-08-27 NOTE — Telephone Encounter (Signed)
Spoke with pt and notified of recs per RA. He verbalized understanding and states nothing further needed.  

## 2011-08-31 ENCOUNTER — Telehealth: Payer: Self-pay | Admitting: Medical Oncology

## 2011-08-31 DIAGNOSIS — C3410 Malignant neoplasm of upper lobe, unspecified bronchus or lung: Secondary | ICD-10-CM

## 2011-08-31 MED ORDER — OMEPRAZOLE 40 MG PO CPDR: 40.0000 mg | DELAYED_RELEASE_CAPSULE | Freq: Every day | ORAL | Status: DC

## 2011-08-31 NOTE — Telephone Encounter (Signed)
Pt requests refill for omeperazole- sent to [facility] for approval then to [facility] via fax.

## 2011-09-03 ENCOUNTER — Ambulatory Visit: Payer: Medicare Other | Admitting: Pulmonary Disease

## 2011-09-07 ENCOUNTER — Telehealth: Payer: Self-pay | Admitting: Medical Oncology

## 2011-09-07 NOTE — Telephone Encounter (Signed)
Fine to refill omeprazole.

## 2011-09-07 NOTE — Telephone Encounter (Signed)
omeperazole shipped out to pt last week

## 2011-09-15 ENCOUNTER — Telehealth: Payer: Self-pay | Admitting: Internal Medicine

## 2011-09-15 NOTE — Telephone Encounter (Signed)
L/M  WITH APPT CHANGE TO [facility] AND WITH NEW TIME

## 2011-09-20 ENCOUNTER — Ambulatory Visit (HOSPITAL_COMMUNITY)
Admission: RE | Admit: 2011-09-20 | Discharge: 2011-09-20 | Disposition: A | Discharge status: Home / Self Care | Payer: Medicare Other | Source: Ambulatory Visit | Attending: Physician Assistant | Admitting: Physician Assistant

## 2011-09-20 DIAGNOSIS — Z9221 Personal history of antineoplastic chemotherapy: Secondary | ICD-10-CM | POA: Insufficient documentation

## 2011-09-20 DIAGNOSIS — I700 Atherosclerosis of aorta: Secondary | ICD-10-CM | POA: Insufficient documentation

## 2011-09-20 DIAGNOSIS — Z923 Personal history of irradiation: Secondary | ICD-10-CM | POA: Insufficient documentation

## 2011-09-20 DIAGNOSIS — N281 Cyst of kidney, acquired: Secondary | ICD-10-CM | POA: Insufficient documentation

## 2011-09-20 DIAGNOSIS — C3410 Malignant neoplasm of upper lobe, unspecified bronchus or lung: Secondary | ICD-10-CM

## 2011-09-20 DIAGNOSIS — C3490 Malignant neoplasm of unspecified part of unspecified bronchus or lung: Secondary | ICD-10-CM | POA: Insufficient documentation

## 2011-09-20 MED ORDER — IOHEXOL 300 MG/ML  SOLN
100.0000 mL | Freq: Once | INTRAMUSCULAR | Status: AC | PRN
  Administered 2011-09-20: 100 mL via INTRAVENOUS

## 2011-09-22 ENCOUNTER — Other Ambulatory Visit: Payer: Medicare Other | Admitting: Lab

## 2011-09-22 ENCOUNTER — Ambulatory Visit (HOSPITAL_BASED_OUTPATIENT_CLINIC_OR_DEPARTMENT_OTHER): Payer: Medicare Other | Admitting: Physician Assistant

## 2011-09-22 ENCOUNTER — Telehealth: Payer: Self-pay | Admitting: *Deleted

## 2011-09-22 ENCOUNTER — Ambulatory Visit (HOSPITAL_BASED_OUTPATIENT_CLINIC_OR_DEPARTMENT_OTHER): Payer: Medicare Other

## 2011-09-22 ENCOUNTER — Other Ambulatory Visit (HOSPITAL_BASED_OUTPATIENT_CLINIC_OR_DEPARTMENT_OTHER): Payer: Medicare Other | Admitting: Lab

## 2011-09-22 ENCOUNTER — Telehealth: Payer: Self-pay | Admitting: Internal Medicine

## 2011-09-22 ENCOUNTER — Other Ambulatory Visit: Payer: Self-pay | Admitting: Medical Oncology

## 2011-09-22 ENCOUNTER — Ambulatory Visit: Payer: Medicare Other | Admitting: Internal Medicine

## 2011-09-22 ENCOUNTER — Encounter: Payer: Self-pay | Admitting: Physician Assistant

## 2011-09-22 VITALS — BP 129/75 | HR 56 | Temp 97.0°F | Ht 68.0 in | Wt 141.2 lb

## 2011-09-22 DIAGNOSIS — Z5111 Encounter for antineoplastic chemotherapy: Secondary | ICD-10-CM

## 2011-09-22 DIAGNOSIS — C3490 Malignant neoplasm of unspecified part of unspecified bronchus or lung: Secondary | ICD-10-CM

## 2011-09-22 DIAGNOSIS — C3410 Malignant neoplasm of upper lobe, unspecified bronchus or lung: Secondary | ICD-10-CM

## 2011-09-22 DIAGNOSIS — R599 Enlarged lymph nodes, unspecified: Secondary | ICD-10-CM

## 2011-09-22 LAB — COMPREHENSIVE METABOLIC PANEL
Albumin: 3.7 g/dL (ref 3.5–5.2)
BUN: 23 mg/dL (ref 6–23)
CO2: 23 mEq/L (ref 19–32)
Calcium: 9.2 mg/dL (ref 8.4–10.5)
Chloride: 103 mEq/L (ref 96–112)
Glucose, Bld: 150 mg/dL — ABNORMAL HIGH (ref 70–99)
Potassium: 3.7 mEq/L (ref 3.5–5.3)
Sodium: 138 mEq/L (ref 135–145)
Total Protein: 6.5 g/dL (ref 6.0–8.3)

## 2011-09-22 LAB — CBC WITH DIFFERENTIAL/PLATELET
Basophils Absolute: 0 10*3/uL (ref 0.0–0.1)
Eosinophils Absolute: 0 10*3/uL (ref 0.0–0.5)
HGB: 11.7 g/dL — ABNORMAL LOW (ref 13.0–17.1)
MCV: 102.1 fL — ABNORMAL HIGH (ref 79.3–98.0)
MONO#: 0.5 10*3/uL (ref 0.1–0.9)
NEUT#: 4 10*3/uL (ref 1.5–6.5)
RBC: 3.38 10*6/uL — ABNORMAL LOW (ref 4.20–5.82)
RDW: 14.4 % (ref 11.0–14.6)
WBC: 5.3 10*3/uL (ref 4.0–10.3)

## 2011-09-22 MED ORDER — HYDROCODONE-HOMATROPINE 5-1.5 MG/5ML PO SYRP: 5.0000 mL | ORAL_SOLUTION | Freq: Four times a day (QID) | ORAL | Status: DC | PRN

## 2011-09-22 MED ORDER — SODIUM CHLORIDE 0.9 % IV SOLN
500.0000 mg/m2 | Freq: Once | INTRAVENOUS | Status: AC
  Administered 2011-09-22: 875 mg via INTRAVENOUS
  Filled 2011-09-22: qty 35

## 2011-09-22 MED ORDER — ALTEPLASE 2 MG IJ SOLR
2.0000 mg | Freq: Once | INTRAMUSCULAR | Status: AC | PRN
  Administered 2011-09-22: 2 mg
  Filled 2011-09-22: qty 2

## 2011-09-22 MED ORDER — DEXAMETHASONE SODIUM PHOSPHATE 10 MG/ML IJ SOLN
10.0000 mg | Freq: Once | INTRAMUSCULAR | Status: AC
  Administered 2011-09-22: 10 mg via INTRAVENOUS

## 2011-09-22 MED ORDER — SODIUM CHLORIDE 0.9 % IV SOLN
15.0000 mg/kg | Freq: Once | INTRAVENOUS | Status: AC
  Administered 2011-09-22: 975 mg via INTRAVENOUS
  Filled 2011-09-22: qty 39

## 2011-09-22 MED ORDER — SODIUM CHLORIDE 0.9 % IV SOLN
Freq: Once | INTRAVENOUS | Status: AC
  Administered 2011-09-22: 14:00:00 via INTRAVENOUS

## 2011-09-22 MED ORDER — HEPARIN SOD (PORK) LOCK FLUSH 100 UNIT/ML IV SOLN
500.0000 [IU] | Freq: Once | INTRAVENOUS | Status: AC | PRN
  Administered 2011-09-22: 500 [IU]
  Filled 2011-09-22: qty 5

## 2011-09-22 MED ORDER — ONDANSETRON 8 MG/50ML IVPB (CHCC)
8.0000 mg | Freq: Once | INTRAVENOUS | Status: AC
  Administered 2011-09-22: 8 mg via INTRAVENOUS

## 2011-09-22 NOTE — Progress Notes (Signed)
Pt discharged home.  No complaints

## 2011-09-22 NOTE — Patient Instructions (Addendum)
[facility] Discharge Instructions for Patients Receiving Chemotherapy  Today you received the following chemotherapy agents avastin and alimta.  To help prevent nausea and vomiting after your treatment, we encourage you to take your nausea medication.  Compazine every 6 hours as needed.     If you develop nausea and vomiting that is not controlled by your nausea medication, call the clinic. If it is after clinic hours your family physician or the after hours number for the clinic or go to the Emergency Department.   BELOW ARE SYMPTOMS THAT SHOULD BE REPORTED IMMEDIATELY:  *FEVER GREATER THAN 100.5 F  *CHILLS WITH OR WITHOUT FEVER  NAUSEA AND VOMITING THAT IS NOT CONTROLLED WITH YOUR NAUSEA MEDICATION  *UNUSUAL SHORTNESS OF BREATH  *UNUSUAL BRUISING OR BLEEDING  TENDERNESS IN MOUTH AND THROAT WITH OR WITHOUT PRESENCE OF ULCERS  *URINARY PROBLEMS  *BOWEL PROBLEMS  UNUSUAL RASH Items with * indicate a potential emergency and should be followed up as soon as possible.  Feel free to call the clinic if you have any questions or concerns. The clinic phone number is [phone].   I have been informed and understand all the instructions given to me. I know to contact the clinic, my physician, or go to the Emergency Department if any problems should occur. I do not have any questions at this time, but understand that I may call the clinic during office hours   should I have any questions or need assistance in obtaining follow up care.    __________________________________________  _____________  __________ Signature of Patient or Authorized Representative            Date                   Time    __________________________________________ Nurse's Signature

## 2011-09-22 NOTE — Telephone Encounter (Signed)
appts made and mw to add chemo and print   aom

## 2011-09-22 NOTE — Telephone Encounter (Signed)
Called in hycodan to pharmacy and pt notified.

## 2011-09-22 NOTE — Telephone Encounter (Signed)
Per staff message I have scheduled appts. JMW  

## 2011-09-23 NOTE — Progress Notes (Signed)
[facility] [phone]   [phone]  OFFICE PROGRESS NOTE  [name], MD [address] [place] [address]  DIAGNOSIS: Metastatic non-small cell lung cancer adenocarcinoma diagnosed in September 2010   PRIOR THERAPY:  #1 status post 6 cycles of systemic chemotherapy with carboplatin, Alimta and Avastin given every 3 weeks last dose given 04/23/2009 with disease stabilization.  #2 status post palliative radiotherapy to the mediastinum under the care of Dr. Michell Heinrich. The patient received a total dose of 3000 cGY and 12 fractions completed 01/21/2009.  #3 Maintenance chemotherapy with Alimta at 500 mg per meter squared and Avastin at 15 mg per kilogram given every 3 weeks status post 35 cycles.  CURRENT THERAPY:f maintenance chemotherapy with Alimta 500 mg/M2 and Avastin 15 mg/kg every 4 weeks,status post 3 cycles.   INTERVAL HISTORY: Inioluwa Baris 66 y.o. male returns to the clinic today for followup visit accompanied by his wife.He notes increased changes in his vision since his last eye exam within the last year. He has a known early cataract in his right eye but has noted blurry vision in his left eye. He has been on prednisone since the first of the year and this has been tapered by his pulmonologist. He is down to 5 mg on Tuesdays and Fridays. He continues to have some right knee pain. He also has noticed some scaly lesions on his scalp. He is willing to see a dermatologist regarding these lesions. He continues to have some mild self limiting nose bleeds primarily first thing in the mornings when he blows his nose.   He is tolerating his maintenance chemotherapy well and much prefers the every 4 week interval.  turning towards the door. The patient denied having any significant chest pain, no weight loss or night sweats. He requests a refill for his Hycodan cough syrup. He had a restaging CT of the chest, abdomen and pelvis with  contrast and is here to discuss the results.  MEDICAL HISTORY: Past Medical History  Diagnosis Date  . Hypertension   . Arthritis   . Syncope and collapse   . Lung cancer     lung ca dx 11/2008    ALLERGIES:  is allergic to percocet and zolpidem tartrate.  MEDICATIONS:  Current Outpatient Prescriptions  Medication Sig Dispense Refill  . amLODipine (NORVASC) 2.5 MG tablet Take 2.5 mg by mouth daily.        Marland Kitchen atenolol-chlorthalidone (TENORETIC) 50-25 MG per tablet Take 1 tablet by mouth Daily.      . B Complex-Biotin-FA (B-COMPLEX PO) Take by mouth. 1500mg  daily       . dexamethasone (DECADRON) 4 MG tablet Take 4 mg by mouth. 1 days before, day of, and after chemotherapy      . doxazosin (CARDURA) 2 MG tablet Take 2 mg by mouth at bedtime.        . FeFum-FePoly-FA-B Cmp-C-Biot (FOLIVANE-PLUS) CAPS Take 1 capsule by mouth daily.  90 capsule  3  . fexofenadine (ALLEGRA) 180 MG tablet Take 180 mg by mouth daily.        . folic acid (FOLVITE) 1 MG tablet Take 1 tablet (1 mg total) by mouth daily.  90 tablet  3  . HYDROcodone-homatropine (HYCODAN) 5-1.5 MG/5ML syrup Take 5 mLs by mouth every 6 (six) hours as needed.  120 mL  0  . ibuprofen (ADVIL,MOTRIN) 200 MG tablet Take 200 mg by mouth every 6 (six) hours as needed.        Marland Kitchen  Multiple Vitamin (MULTIVITAMIN) tablet Take 1 tablet by mouth daily.        Marland Kitchen omeprazole (PRILOSEC) 40 MG capsule Take 1 capsule (40 mg total) by mouth daily.  90 capsule  1  . predniSONE (DELTASONE) 10 MG tablet Take 5 mg by mouth daily. As directed.      . prochlorperazine (COMPAZINE) 10 MG tablet Take 10 mg by mouth every 6 (six) hours as needed.        Bernadette Hoit Sodium (SENOKOT S PO) Take by mouth. 3 tabs daily         SURGICAL HISTORY:  Past Surgical History  Procedure Date  . 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 Rotator cuff repair     REVIEW OF SYSTEMS:  Pertinent items are noted in HPI.   PHYSICAL EXAMINATION:  General appearance: alert, cooperative and no distress Head: Normocephalic, without obvious abnormality, atraumatic Neck: no adenopathy Lymph nodes: Cervical, supraclavicular, and axillary nodes normal. Resp: clear to auscultation bilaterally Cardio: regular rate and rhythm, S1, S2 normal, no murmur, click, rub or gallop GI: soft, non-tender; bowel sounds normal; no masses,  no organomegaly Extremities: extremities normal, atraumatic, no cyanosis or edema Neurologic: Alert and oriented X 3, normal strength and tone. Normal symmetric reflexes. Normal coordination and gait  ECOG PERFORMANCE STATUS: 1 - Symptomatic but completely ambulatory  Blood pressure 129/75, pulse 56, temperature 97 F (36.1 C), temperature source Oral, height 5\' 8"  (1.727 m), weight 141 lb 3.2 oz (64.048 kg).  LABORATORY DATA: Lab Results  Component Value Date   WBC 5.3 09/22/2011   HGB 11.7* 09/22/2011   HCT 34.5* 09/22/2011   MCV 102.1* 09/22/2011   PLT 180 09/22/2011      Chemistry      Component Value Date/Time   NA 138 09/22/2011 0958   K 3.7 09/22/2011 0958   CL 103 09/22/2011 0958   CO2 23 09/22/2011 0958   BUN 23 09/22/2011 0958   CREATININE 0.89 09/22/2011 0958      Component Value Date/Time   CALCIUM 9.2 09/22/2011 0958   ALKPHOS 69 09/22/2011 0958   AST 28 09/22/2011 0958   ALT 18 09/22/2011 0958   BILITOT 0.6 09/22/2011 0958       RADIOGRAPHIC STUDIES: Ct Chest W Contrast  06/25/2011  *RADIOLOGY REPORT*  Clinical Data:  Lung cancer diagnosed 11/2008, chemotherapy ongoing, XRT completed in 02/2009.  CT CHEST, ABDOMEN AND PELVIS WITH CONTRAST  Technique:  Multidetector CT imaging of the chest, abdomen and pelvis was performed following the standard protocol during bolus administration of intravenous contrast.  Contrast:  100 ml Omnipaque-300 IV  Comparison:  04/30/2011   CT CHEST  Findings:  Stable right paramediastinal radiation changes.  Stable suspected pleural parenchymal nodularity in the left lung  apex (series 5/image 11).  Prior right middle lobe and left lower lobe nodular opacities have resolved.  Mild residual ground-glass opacity in the posterior right lower lobe and lingula, improved, likely infectious/inflammatory.  Underlying mild subpleural reticulation/paraseptal emphysematous changes in the lower lobes.  No new/suspicious pulmonary nodules. No pleural effusion or pneumothorax.  Visualized thyroid is unremarkable.  The heart is normal in size.  No pericardial effusion.   Coronary atherosclerosis.  Atherosclerotic calcifications of the aortic arch.  Right chest port.  Small mediastinal lymph nodes which do not meet pathologic CT size criteria.  Degenerative changes of the thoracic spine.  IMPRESSION: Stable right paramediastinal radiation changes.  No evidence of recurrent or  metastatic disease in the chest.  Near complete resolution of prior lower lobe predominant nodular ground-glass opacities, likely infectious/inflammatory.   CT ABDOMEN AND PELVIS  Findings:  Liver, spleen, pancreas, and adrenal glands are within normal limits.  Gallbladder is unremarkable.  No intrahepatic or extrahepatic ductal dilatation.  1.4 cm left upper pole cysts.  Right kidney is unremarkable.  No hydronephrosis.  No evidence of bowel obstruction.  Atherosclerotic calcifications of the abdominal aorta and branch vessels.  No abdominopelvic ascites.  Small retroperitoneal lymph nodes which do not meet pathologic CT size criteria.  No suspicious abdominopelvic lymphadenopathy.  Prostate is unremarkable.  Bladder is within normal limits.  Mild degenerative changes of the lumbar spine.  IMPRESSION: No evidence of metastatic disease in the abdomen/pelvis.  Original Report Authenticated By: Charline Bills, M.D.   Ct Chest W Contrast  09/20/2011  *RADIOLOGY REPORT*  Clinical Data:  Lung cancer diagnosed 2010, chemotherapy ongoing, prior XRT  CT CHEST, ABDOMEN AND PELVIS WITH CONTRAST  Technique:  Multidetector CT imaging  of the chest, abdomen and pelvis was performed following the standard protocol during bolus administration of intravenous contrast.  Contrast: , OMNIPAQUE IOHEXOL 300 MG/ML  SOLN  Comparison:  06/25/2011  CT CHEST  Findings:  Stable right paramediastinal radiation changes.  Mild paraseptal emphysematous changes / subpleural reticulation.  Stable mild nodularity in the posterior left lung apex (series 5/image 11), favored to reflect pleural parenchymal scarring.  No new/suspicious pulmonary nodules.  No pleural effusion or pneumothorax.  Visualized thyroid is unremarkable.  The heart is normal in size.  No pericardial effusion.  Coronary atherosclerosis.  Atherosclerotic calcifications of the aortic arch.  Right chest port.  Small right paratracheal lymph nodes measuring up to 8 mm short axis, grossly unchanged.  No suspicious hilar or axillary lymphadenopathy.  Degenerative changes of the thoracic spine.  IMPRESSION: Stable right paramediastinal radiation changes.  No evidence of recurrent or metastatic disease in the chest.  CT ABDOMEN AND PELVIS  Findings:  Liver, spleen, pancreas, and adrenal glands are within normal limits.  Gallbladder is unremarkable.  No intrahepatic or extrahepatic ductal dilatation.  12 mm left renal cyst (series 2/image 68).  Right kidney is unremarkable.  No hydronephrosis.  No evidence of bowel obstruction.  Atherosclerotic calcifications of the abdominal aorta and branch vessels.  No abdominopelvic ascites.  Borderline enlarged retroperitoneal lymph nodes, the largest a 12 mm short axis aortocaval node (series 2/image 61), previously 9 mm.  Prostate is top normal in size.  Bladder is within normal limits.  Mild degenerative changes of the lumbar spine.  IMPRESSION: Borderline enlarged retroperitoneal lymph nodes measuring up to 12 mm short axis, mildly increased, suspicious.  Attention on follow- up is suggested.  Original Report Authenticated By: Charline Bills, M.D.   Ct  Abdomen Pelvis W Contrast  09/20/2011  *RADIOLOGY REPORT*  Clinical Data:  Lung cancer diagnosed 2010, chemotherapy ongoing, prior XRT  CT CHEST, ABDOMEN AND PELVIS WITH CONTRAST  Technique:  Multidetector CT imaging of the chest, abdomen and pelvis was performed following the standard protocol during bolus administration of intravenous contrast.  Contrast: , OMNIPAQUE IOHEXOL 300 MG/ML  SOLN  Comparison:  06/25/2011  CT CHEST  Findings:  Stable right paramediastinal radiation changes.  Mild paraseptal emphysematous changes / subpleural reticulation.  Stable mild nodularity in the posterior left lung apex (series 5/image 11), favored to reflect pleural parenchymal scarring.  No new/suspicious pulmonary nodules.  No pleural effusion or pneumothorax.  Visualized thyroid is unremarkable.  The heart  is normal in size.  No pericardial effusion.  Coronary atherosclerosis.  Atherosclerotic calcifications of the aortic arch.  Right chest port.  Small right paratracheal lymph nodes measuring up to 8 mm short axis, grossly unchanged.  No suspicious hilar or axillary lymphadenopathy.  Degenerative changes of the thoracic spine.  IMPRESSION: Stable right paramediastinal radiation changes.  No evidence of recurrent or metastatic disease in the chest.  CT ABDOMEN AND PELVIS  Findings:  Liver, spleen, pancreas, and adrenal glands are within normal limits.  Gallbladder is unremarkable.  No intrahepatic or extrahepatic ductal dilatation.  12 mm left renal cyst (series 2/image 68).  Right kidney is unremarkable.  No hydronephrosis.  No evidence of bowel obstruction.  Atherosclerotic calcifications of the abdominal aorta and branch vessels.  No abdominopelvic ascites.  Borderline enlarged retroperitoneal lymph nodes, the largest a 12 mm short axis aortocaval node (series 2/image 61), previously 9 mm.  Prostate is top normal in size.  Bladder is within normal limits.  Mild degenerative changes of the lumbar spine.  IMPRESSION:  Borderline enlarged retroperitoneal lymph nodes measuring up to 12 mm short axis, mildly increased, suspicious.  Attention on follow- up is suggested.  Original Report Authenticated By: Charline Bills, M.D.    ASSESSMENT/PLAN: This is a very pleasant 66 years old white male with metastatic non-small cell lung cancer , adenocarcinoma. The patient is currently on maintenance chemotherapy with Alimta and Avastin, now every 4 weeks.  The patient was discussed with Dr. Darrold Span who also reviewed the CT scan results in Dr. Asa Lente absence. There was no evidence for disease progression within the chest. In the abdomen there was  borderline enlarged retroperitoneal lymph nodes, the largest previously 9 mm increased to 12 mm, It was felt that he should continue on his current maintenance chemotherapy and closely monitor this area on his next restaging CT scan. The patient and his wife were in agreement with this plan.  He will proceed with his scheduled cycle of maintenance chemotherapy with Alimta and Avastin.He will follow up in 1 month with a repeat CBC Differential, CMET and u/a protein prior to his next schedule cycle of maintenance chemotherapy with Alimta and Avastin.   Laural Benes, Gentry Seeber E, PA-C   All questions were answered. The patient knows to call the clinic with any problems, questions or concerns. We can certainly see the patient much sooner if necessary.

## 2011-09-28 ENCOUNTER — Telehealth: Payer: Self-pay | Admitting: Pulmonary Disease

## 2011-09-28 NOTE — Telephone Encounter (Signed)
I spoke with pt and he is requesting to come off the prednisone completely. He states since he has started taking this in feb. He has noticed change in his vision and increase joint pain. Pt has upcoming apt 10/12/11 with RA at 3:00 but pt is wanting to go ahead and stop this now. I advised will have to get the okay from RA and he will be back in the office tomorrow. He was fine with this. Please advise Dr. Vassie Loll, thanks

## 2011-09-28 NOTE — Telephone Encounter (Signed)
He was down to twice a week -Ok to stop

## 2011-09-28 NOTE — Telephone Encounter (Signed)
I spoke with pt and is aware ok to stop the prednisone. Nothing further was needed

## 2011-10-12 ENCOUNTER — Encounter: Payer: Self-pay | Admitting: Pulmonary Disease

## 2011-10-12 ENCOUNTER — Ambulatory Visit (INDEPENDENT_AMBULATORY_CARE_PROVIDER_SITE_OTHER): Payer: Medicare Other | Admitting: Pulmonary Disease

## 2011-10-12 VITALS — BP 118/80 | HR 64 | Temp 97.8°F | Ht 67.0 in | Wt 140.4 lb

## 2011-10-12 DIAGNOSIS — R918 Other nonspecific abnormal finding of lung field: Secondary | ICD-10-CM

## 2011-10-12 DIAGNOSIS — C3410 Malignant neoplasm of upper lobe, unspecified bronchus or lung: Secondary | ICD-10-CM

## 2011-10-12 MED ORDER — HYDROCODONE-HOMATROPINE 5-1.5 MG/5ML PO SYRP: 5.0000 mL | ORAL_SOLUTION | Freq: Four times a day (QID) | ORAL | Status: DC | PRN

## 2011-10-12 NOTE — Patient Instructions (Addendum)
Inflammation in lung has subsided

## 2011-10-12 NOTE — Progress Notes (Signed)
  Subjective:    Patient ID: [name], male    DOB: 1946/01/09, 66 y.o.   MRN: [id number]  HPI 65/M, ex smoker with metastatic adenoCA diagnosed in sep '10 s/p chemo & RT to mediastinum last 10/10 CT chest 2/13 showed Multiple new ground-glass nodules in the lower lobes - not seen in 11/12.  These resolved with prednisone ? inflammatory  He is on maintenance chemo alimta & avastin  Spirometry 2012 showed mild obstruction but preserved FEv1 at 97%   07/07/2011  CT chest 3/13 showed resolution of infiltrates   Spirometry >> mild airway obstruction, FEv1 93%    10/12/2011 Tapered off pred - 7/13  Cough has unchanged since last visit. breathing has slightly worsened due to heat. denies any wheezing, chest tx . Pt stopped the symbicort/albuterol bc he did not notice a difference Knee injury Dry cough Ct chest/ abd 5/13 stable para mediastinal changes, Borderline enlarged retroperitoneal lymph nodes  Continues on 4 wkly maintenance chemo  No heartburn or sinus drainage, wt remains low   Review of Systems Pt denies any significant  nasal congestion or excess secretions, fever, chills, sweats, unintended wt loss, pleuritic or exertional cp, orthopnea pnd or leg swelling.  Pt also denies any obvious fluctuation in symptoms with weather or environmental change or other alleviating or aggravating factors.    Pt denies any increase in rescue therapy over baseline, denies waking up needing it or having early am exacerbations or coughing/wheezing/ or dyspnea `     Objective:   Physical Exam  Gen. Pleasant, well-nourished, in no distress ENT - no lesions, no post nasal drip Neck: No JVD, no thyromegaly, no carotid bruits Lungs: no use of accessory muscles, no dullness to percussion, lt basal rales , no rhonchi  Cardiovascular: Rhythm regular, heart sounds  normal, no murmurs or gallops, no peripheral edema Musculoskeletal: No deformities, no cyanosis or clubbing         Assessment &  Plan:

## 2011-10-14 NOTE — Assessment & Plan Note (Signed)
Favor infectious vs inflammatory given acute appearance 2/13 within 2 months & resolution with prednisone Stable changes of radiation otherwise on his last imaging

## 2011-10-14 NOTE — Assessment & Plan Note (Signed)
Metastatic adenoCA diagnosed 9/10 on maintenance chemo Some concern for recurrence in abdominal lymph nodes on last CT. Symptomatic Rx for cough/ dyspnea He is considering arthroscopic knee surgery - Ok from pulmonary standpt since lung function is preserved.

## 2011-10-20 ENCOUNTER — Ambulatory Visit (HOSPITAL_BASED_OUTPATIENT_CLINIC_OR_DEPARTMENT_OTHER): Payer: Medicare Other

## 2011-10-20 ENCOUNTER — Encounter: Payer: Self-pay | Admitting: Physician Assistant

## 2011-10-20 ENCOUNTER — Telehealth: Payer: Self-pay | Admitting: *Deleted

## 2011-10-20 ENCOUNTER — Other Ambulatory Visit (HOSPITAL_BASED_OUTPATIENT_CLINIC_OR_DEPARTMENT_OTHER): Payer: Medicare Other | Admitting: Lab

## 2011-10-20 ENCOUNTER — Ambulatory Visit (HOSPITAL_BASED_OUTPATIENT_CLINIC_OR_DEPARTMENT_OTHER): Payer: Medicare Other | Admitting: Physician Assistant

## 2011-10-20 VITALS — BP 142/86 | HR 63 | Temp 96.7°F | Ht 67.0 in | Wt 139.9 lb

## 2011-10-20 DIAGNOSIS — C3410 Malignant neoplasm of upper lobe, unspecified bronchus or lung: Secondary | ICD-10-CM

## 2011-10-20 DIAGNOSIS — C3490 Malignant neoplasm of unspecified part of unspecified bronchus or lung: Secondary | ICD-10-CM

## 2011-10-20 DIAGNOSIS — Z5111 Encounter for antineoplastic chemotherapy: Secondary | ICD-10-CM

## 2011-10-20 DIAGNOSIS — Z5112 Encounter for antineoplastic immunotherapy: Secondary | ICD-10-CM

## 2011-10-20 LAB — CBC WITH DIFFERENTIAL/PLATELET
EOS%: 0 % (ref 0.0–7.0)
Eosinophils Absolute: 0 10*3/uL (ref 0.0–0.5)
LYMPH%: 4.8 % — ABNORMAL LOW (ref 14.0–49.0)
MCH: 34.4 pg — ABNORMAL HIGH (ref 27.2–33.4)
MCV: 99.1 fL — ABNORMAL HIGH (ref 79.3–98.0)
MONO%: 7.2 % (ref 0.0–14.0)
NEUT#: 6.2 10*3/uL (ref 1.5–6.5)
Platelets: 183 10*3/uL (ref 140–400)
RBC: 3.34 10*6/uL — ABNORMAL LOW (ref 4.20–5.82)
RDW: 14.9 % — ABNORMAL HIGH (ref 11.0–14.6)

## 2011-10-20 LAB — COMPREHENSIVE METABOLIC PANEL
AST: 22 U/L (ref 0–37)
Alkaline Phosphatase: 60 U/L (ref 39–117)
BUN: 24 mg/dL — ABNORMAL HIGH (ref 6–23)
Glucose, Bld: 126 mg/dL — ABNORMAL HIGH (ref 70–99)
Potassium: 3.7 mEq/L (ref 3.5–5.3)
Sodium: 140 mEq/L (ref 135–145)
Total Bilirubin: 0.5 mg/dL (ref 0.3–1.2)
Total Protein: 6.6 g/dL (ref 6.0–8.3)

## 2011-10-20 MED ORDER — SODIUM CHLORIDE 0.9 % IV SOLN
15.0000 mg/kg | Freq: Once | INTRAVENOUS | Status: AC
  Administered 2011-10-20: 975 mg via INTRAVENOUS
  Filled 2011-10-20: qty 39

## 2011-10-20 MED ORDER — ONDANSETRON 8 MG/50ML IVPB (CHCC)
8.0000 mg | Freq: Once | INTRAVENOUS | Status: AC
  Administered 2011-10-20: 8 mg via INTRAVENOUS

## 2011-10-20 MED ORDER — HEPARIN SOD (PORK) LOCK FLUSH 100 UNIT/ML IV SOLN
500.0000 [IU] | Freq: Once | INTRAVENOUS | Status: AC | PRN
  Administered 2011-10-20: 500 [IU]
  Filled 2011-10-20: qty 5

## 2011-10-20 MED ORDER — SODIUM CHLORIDE 0.9 % IJ SOLN
10.0000 mL | INTRAMUSCULAR | Status: DC | PRN
  Administered 2011-10-20: 10 mL
  Filled 2011-10-20: qty 10

## 2011-10-20 MED ORDER — DEXAMETHASONE SODIUM PHOSPHATE 10 MG/ML IJ SOLN
10.0000 mg | Freq: Once | INTRAMUSCULAR | Status: AC
  Administered 2011-10-20: 10 mg via INTRAVENOUS

## 2011-10-20 MED ORDER — SODIUM CHLORIDE 0.9 % IV SOLN
Freq: Once | INTRAVENOUS | Status: AC
  Administered 2011-10-20: 12:00:00 via INTRAVENOUS

## 2011-10-20 MED ORDER — SODIUM CHLORIDE 0.9 % IV SOLN
500.0000 mg/m2 | Freq: Once | INTRAVENOUS | Status: AC
  Administered 2011-10-20: 875 mg via INTRAVENOUS
  Filled 2011-10-20: qty 35

## 2011-10-20 NOTE — Patient Instructions (Signed)
[facility] Discharge Instructions for Patients Receiving Chemotherapy  Today you received the following chemotherapy agents Avastin and Alimta  To help prevent nausea and vomiting after your treatment, we encourage you to take your nausea medication as prescribed.   If you develop nausea and vomiting that is not controlled by your nausea medication, call the clinic. If it is after clinic hours your family physician or the after hours number for the clinic or go to the Emergency Department.   BELOW ARE SYMPTOMS THAT SHOULD BE REPORTED IMMEDIATELY:  *FEVER GREATER THAN 100.5 F  *CHILLS WITH OR WITHOUT FEVER  NAUSEA AND VOMITING THAT IS NOT CONTROLLED WITH YOUR NAUSEA MEDICATION  *UNUSUAL SHORTNESS OF BREATH  *UNUSUAL BRUISING OR BLEEDING  TENDERNESS IN MOUTH AND THROAT WITH OR WITHOUT PRESENCE OF ULCERS  *URINARY PROBLEMS  *BOWEL PROBLEMS  UNUSUAL RASH Items with * indicate a potential emergency and should be followed up as soon as possible.  One of the nurses will contact you 24 hours after your treatment. Please let the nurse know about any problems that you may have experienced. Feel free to call the clinic you have any questions or concerns. The clinic phone number is [phone].   I have been informed and understand all the instructions given to me. I know to contact the clinic, my physician, or go to the Emergency Department if any problems should occur. I do not have any questions at this time, but understand that I may call the clinic during office hours   should I have any questions or need assistance in obtaining follow up care.    __________________________________________  _____________  __________ Signature of Patient or Authorized Representative            Date                   Time    __________________________________________ Nurse's Signature    

## 2011-10-20 NOTE — Telephone Encounter (Signed)
Gave patient appointment for 11-18-2011 starting at 8:45am lab sent [facility] email to set up patient treatment

## 2011-10-20 NOTE — Telephone Encounter (Signed)
Per staff message and POF I have scheduled appts.  JMW  

## 2011-10-21 NOTE — Progress Notes (Signed)
[facility] [phone]   [phone]  OFFICE PROGRESS NOTE  [name], MD [address] [place] [address]  DIAGNOSIS: Metastatic non-small cell lung cancer adenocarcinoma diagnosed in September 2010   PRIOR THERAPY:  #1 status post 6 cycles of systemic chemotherapy with carboplatin, Alimta and Avastin given every 3 weeks last dose given 04/23/2009 with disease stabilization.  #2 status post palliative radiotherapy to the mediastinum under the care of Dr. Michell Heinrich. The patient received a total dose of 3000 cGY and 12 fractions completed 01/21/2009.  #3 Maintenance chemotherapy with Alimta at 500 mg per meter squared and Avastin at 15 mg per kilogram given every 3 weeks status post 35 cycles.  CURRENT THERAPY:f maintenance chemotherapy with Alimta 500 mg/M2 and Avastin 15 mg/kg every 4 weeks,status post 4 cycles.   INTERVAL HISTORY: Lyall Faciane 66 y.o. male returns to the clinic today for followup visit accompanied by his wife. He was recently seen by [facility], he is completely off of his steroids now. His cough syrup, Hycodan, was refilled by Dr. Vassie Loll at his last visit. He feels as though his breathing is about the same as when he was on the steroids. He continues to have a cough that is well-controlled with the Hycodan cough syrup. He will need cataract surgery and possibly arthroscopic knee surgery. He is wondering if all of this can be done around the same time that he has to come off of his Avastin in order to have  the surgery. He might as well arrange them so that they can be done to take advantage of the Avastin break. He has some slight increase in his retroperitoneal nodes and is wondering if any surgical intervention can be done in this area. He continues to have some mild self limiting nose bleeds primarily first thing in the mornings when he blows his nose.   He is tolerating his maintenance chemotherapy well and much  prefers the every 4 week interval.  turning towards the door. The patient denied having any significant chest pain, no weight loss or night sweats.   MEDICAL HISTORY: Past Medical History  Diagnosis Date  . Hypertension   . Arthritis   . Syncope and collapse   . Lung cancer     lung ca dx 11/2008    ALLERGIES:  is allergic to percocet and zolpidem tartrate.  MEDICATIONS:  Current Outpatient Prescriptions  Medication Sig Dispense Refill  . amLODipine (NORVASC) 2.5 MG tablet Take 2.5 mg by mouth daily.        Marland Kitchen atenolol-chlorthalidone (TENORETIC) 50-25 MG per tablet Take 1 tablet by mouth Daily.      . B Complex-Biotin-FA (B-COMPLEX PO) Take by mouth. 1500mg  daily       . dexamethasone (DECADRON) 4 MG tablet Take 4 mg by mouth. 1 tablet day before, day of and day after chemo      . doxazosin (CARDURA) 2 MG tablet Take 2 mg by mouth at bedtime.        . FeFum-FePoly-FA-B Cmp-C-Biot (FOLIVANE-PLUS) CAPS Take 1 capsule by mouth daily.  90 capsule  3  . fexofenadine (ALLEGRA) 180 MG tablet Take 180 mg by mouth daily.        . folic acid (FOLVITE) 1 MG tablet Take 1 tablet (1 mg total) by mouth daily.  90 tablet  3  . HYDROcodone-homatropine (HYCODAN) 5-1.5 MG/5ML syrup Take 5 mLs by mouth every 6 (six) hours as needed.  120 mL  0  . ibuprofen (ADVIL,MOTRIN) 200 MG tablet Take 200 mg by mouth every 6 (six) hours as needed.        . Multiple Vitamin (MULTIVITAMIN) tablet Take 1 tablet by mouth daily.        Marland Kitchen omeprazole (PRILOSEC) 40 MG capsule Take 1 capsule (40 mg total) by mouth daily.  90 capsule  1  . prochlorperazine (COMPAZINE) 10 MG tablet Take 10 mg by mouth every 6 (six) hours as needed.        Bernadette Hoit Sodium (SENOKOT S PO) Take by mouth. 2 tabs daily       No current facility-administered medications for this visit.   Facility-Administered Medications Ordered in Other Visits  Medication Dose Route Frequency Provider Last Rate Last Dose  . 0.9 %  sodium chloride  infusion   Intravenous Once Conni Slipper, PA      . bevacizumab (AVASTIN) 975 mg in sodium chloride 0.9 % 100 mL chemo infusion  15 mg/kg (Treatment Plan Actual) Intravenous Once Conni Slipper, PA   975 mg at 10/20/11 1310  . dexamethasone (DECADRON) injection 10 mg  10 mg Intravenous Once Conni Slipper, PA   10 mg at 10/20/11 1206  . heparin lock flush 100 unit/mL  500 Units Intracatheter Once PRN Conni Slipper, PA   500 Units at 10/20/11 1405  . ondansetron (ZOFRAN) IVPB 8 mg  8 mg Intravenous Once Conni Slipper, PA   8 mg at 10/20/11 1206  . PEMEtrexed (ALIMTA) 875 mg in sodium chloride 0.9 % 100 mL chemo infusion  500 mg/m2 (Treatment Plan Actual) Intravenous Once Conni Slipper, PA   875 mg at 10/20/11 1347  . DISCONTD: sodium chloride 0.9 % injection 10 mL  10 mL Intracatheter PRN Conni Slipper, PA   10 mL at 10/20/11 1405    SURGICAL HISTORY:  Past Surgical History  Procedure Date  . 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 Rotator cuff repair     REVIEW OF SYSTEMS:  Pertinent items are noted in HPI.   PHYSICAL EXAMINATION: General appearance: alert, cooperative and no distress Head: Normocephalic, without obvious abnormality, atraumatic Neck: no adenopathy Lymph nodes: Cervical, supraclavicular, and axillary nodes normal. Resp: clear to auscultation bilaterally Cardio: regular rate and rhythm, S1, S2 normal, no murmur, click, rub or gallop GI: soft, non-tender; bowel sounds normal; no masses,  no organomegaly Extremities: extremities normal, atraumatic, no cyanosis or edema Neurologic: Alert and oriented X 3, normal strength and tone. Normal symmetric reflexes. Normal coordination and gait  ECOG PERFORMANCE STATUS: 1 - Symptomatic but completely ambulatory  Blood pressure 142/86, pulse 63, temperature 96.7 F (35.9 C), temperature source Oral, height 5\' 7"  (1.702 m), weight 139 lb 14.4 oz (63.458 kg).  LABORATORY DATA: Lab Results    Component Value Date   WBC 7.1 10/20/2011   HGB 11.5* 10/20/2011   HCT 33.1* 10/20/2011   MCV 99.1* 10/20/2011   PLT 183 10/20/2011      Chemistry      Component Value Date/Time   NA 140 10/20/2011 1011   K 3.7 10/20/2011 1011   CL 103 10/20/2011 1011   CO2 23 10/20/2011 1011   BUN 24* 10/20/2011 1011   CREATININE 1.30 10/20/2011 1011      Component Value Date/Time   CALCIUM 9.8 10/20/2011 1011   ALKPHOS 60 10/20/2011 1011   AST 22 10/20/2011 1011   ALT 15  10/20/2011 1011   BILITOT 0.5 10/20/2011 1011       RADIOGRAPHIC STUDIES: Ct Chest W Contrast  06/25/2011  *RADIOLOGY REPORT*  Clinical Data:  Lung cancer diagnosed 11/2008, chemotherapy ongoing, XRT completed in 02/2009.  CT CHEST, ABDOMEN AND PELVIS WITH CONTRAST  Technique:  Multidetector CT imaging of the chest, abdomen and pelvis was performed following the standard protocol during bolus administration of intravenous contrast.  Contrast:  100 ml Omnipaque-300 IV  Comparison:  04/30/2011   CT CHEST  Findings:  Stable right paramediastinal radiation changes.  Stable suspected pleural parenchymal nodularity in the left lung apex (series 5/image 11).  Prior right middle lobe and left lower lobe nodular opacities have resolved.  Mild residual ground-glass opacity in the posterior right lower lobe and lingula, improved, likely infectious/inflammatory.  Underlying mild subpleural reticulation/paraseptal emphysematous changes in the lower lobes.  No new/suspicious pulmonary nodules. No pleural effusion or pneumothorax.  Visualized thyroid is unremarkable.  The heart is normal in size.  No pericardial effusion.   Coronary atherosclerosis.  Atherosclerotic calcifications of the aortic arch.  Right chest port.  Small mediastinal lymph nodes which do not meet pathologic CT size criteria.  Degenerative changes of the thoracic spine.  IMPRESSION: Stable right paramediastinal radiation changes.  No evidence of recurrent or metastatic disease in the chest.   Near complete resolution of prior lower lobe predominant nodular ground-glass opacities, likely infectious/inflammatory.   CT ABDOMEN AND PELVIS  Findings:  Liver, spleen, pancreas, and adrenal glands are within normal limits.  Gallbladder is unremarkable.  No intrahepatic or extrahepatic ductal dilatation.  1.4 cm left upper pole cysts.  Right kidney is unremarkable.  No hydronephrosis.  No evidence of bowel obstruction.  Atherosclerotic calcifications of the abdominal aorta and branch vessels.  No abdominopelvic ascites.  Small retroperitoneal lymph nodes which do not meet pathologic CT size criteria.  No suspicious abdominopelvic lymphadenopathy.  Prostate is unremarkable.  Bladder is within normal limits.  Mild degenerative changes of the lumbar spine.  IMPRESSION: No evidence of metastatic disease in the abdomen/pelvis.  Original Report Authenticated By: Charline Bills, M.D.   Ct Chest W Contrast  09/20/2011  *RADIOLOGY REPORT*  Clinical Data:  Lung cancer diagnosed 2010, chemotherapy ongoing, prior XRT  CT CHEST, ABDOMEN AND PELVIS WITH CONTRAST  Technique:  Multidetector CT imaging of the chest, abdomen and pelvis was performed following the standard protocol during bolus administration of intravenous contrast.  Contrast: , OMNIPAQUE IOHEXOL 300 MG/ML  SOLN  Comparison:  06/25/2011  CT CHEST  Findings:  Stable right paramediastinal radiation changes.  Mild paraseptal emphysematous changes / subpleural reticulation.  Stable mild nodularity in the posterior left lung apex (series 5/image 11), favored to reflect pleural parenchymal scarring.  No new/suspicious pulmonary nodules.  No pleural effusion or pneumothorax.  Visualized thyroid is unremarkable.  The heart is normal in size.  No pericardial effusion.  Coronary atherosclerosis.  Atherosclerotic calcifications of the aortic arch.  Right chest port.  Small right paratracheal lymph nodes measuring up to 8 mm short axis, grossly unchanged.  No  suspicious hilar or axillary lymphadenopathy.  Degenerative changes of the thoracic spine.  IMPRESSION: Stable right paramediastinal radiation changes.  No evidence of recurrent or metastatic disease in the chest.  CT ABDOMEN AND PELVIS  Findings:  Liver, spleen, pancreas, and adrenal glands are within normal limits.  Gallbladder is unremarkable.  No intrahepatic or extrahepatic ductal dilatation.  12 mm left renal cyst (series 2/image 68).  Right kidney is unremarkable.  No hydronephrosis.  No evidence of bowel obstruction.  Atherosclerotic calcifications of the abdominal aorta and branch vessels.  No abdominopelvic ascites.  Borderline enlarged retroperitoneal lymph nodes, the largest a 12 mm short axis aortocaval node (series 2/image 61), previously 9 mm.  Prostate is top normal in size.  Bladder is within normal limits.  Mild degenerative changes of the lumbar spine.  IMPRESSION: Borderline enlarged retroperitoneal lymph nodes measuring up to 12 mm short axis, mildly increased, suspicious.  Attention on follow- up is suggested.  Original Report Authenticated By: Charline Bills, M.D.   Ct Abdomen Pelvis W Contrast  09/20/2011  *RADIOLOGY REPORT*  Clinical Data:  Lung cancer diagnosed 2010, chemotherapy ongoing, prior XRT  CT CHEST, ABDOMEN AND PELVIS WITH CONTRAST  Technique:  Multidetector CT imaging of the chest, abdomen and pelvis was performed following the standard protocol during bolus administration of intravenous contrast.  Contrast: , OMNIPAQUE IOHEXOL 300 MG/ML  SOLN  Comparison:  06/25/2011  CT CHEST  Findings:  Stable right paramediastinal radiation changes.  Mild paraseptal emphysematous changes / subpleural reticulation.  Stable mild nodularity in the posterior left lung apex (series 5/image 11), favored to reflect pleural parenchymal scarring.  No new/suspicious pulmonary nodules.  No pleural effusion or pneumothorax.  Visualized thyroid is unremarkable.  The heart is normal in size.  No  pericardial effusion.  Coronary atherosclerosis.  Atherosclerotic calcifications of the aortic arch.  Right chest port.  Small right paratracheal lymph nodes measuring up to 8 mm short axis, grossly unchanged.  No suspicious hilar or axillary lymphadenopathy.  Degenerative changes of the thoracic spine.  IMPRESSION: Stable right paramediastinal radiation changes.  No evidence of recurrent or metastatic disease in the chest.  CT ABDOMEN AND PELVIS  Findings:  Liver, spleen, pancreas, and adrenal glands are within normal limits.  Gallbladder is unremarkable.  No intrahepatic or extrahepatic ductal dilatation.  12 mm left renal cyst (series 2/image 68).  Right kidney is unremarkable.  No hydronephrosis.  No evidence of bowel obstruction.  Atherosclerotic calcifications of the abdominal aorta and branch vessels.  No abdominopelvic ascites.  Borderline enlarged retroperitoneal lymph nodes, the largest a 12 mm short axis aortocaval node (series 2/image 61), previously 9 mm.  Prostate is top normal in size.  Bladder is within normal limits.  Mild degenerative changes of the lumbar spine.  IMPRESSION: Borderline enlarged retroperitoneal lymph nodes measuring up to 12 mm short axis, mildly increased, suspicious.  Attention on follow- up is suggested.  Original Report Authenticated By: Charline Bills, M.D.    ASSESSMENT/PLAN: This is a very pleasant 66 years old white male with metastatic non-small cell lung cancer , adenocarcinoma. The patient is currently on maintenance chemotherapy with Alimta and Avastin, now every 4 weeks.  The patient was discussed with Dr. Arbutus Ped. On his most recent restaging CT scan there was no evidence for disease progression within the chest. In the abdomen there was  borderline enlarged retroperitoneal lymph nodes, the largest previously 9 mm increased to 12 mm, It was felt that he should continue on his current maintenance chemotherapy and closely monitor this area on his next restaging CT  scan.  He will proceed with his scheduled cycle of maintenance chemotherapy with Alimta and Avastin.He will follow up in 1 month with a repeat CBC Differential, CMET and u/a protein prior to his next schedule cycle of maintenance chemotherapy with Alimta and Avastin. He'll be seen by Dr. Gwenyth Bouillon on his next visit and he will will discuss his upcoming surgical needs  and concerns with Dr. Arbutus Ped at that followup visit.   Laural Benes, Mckell Riecke E, PA-C   All questions were answered. The patient knows to call the clinic with any problems, questions or concerns. We can certainly see the patient much sooner if necessary.

## 2011-11-16 ENCOUNTER — Telehealth: Payer: Self-pay | Admitting: Internal Medicine

## 2011-11-16 NOTE — Telephone Encounter (Signed)
pt had called to ck on appts and his tx was put on 8/21 in error,left a vm for mw to move tx to 8/22 and l/m for pt to be here at 8;45 on 8/22

## 2011-11-17 ENCOUNTER — Ambulatory Visit: Payer: Medicare Other

## 2011-11-18 ENCOUNTER — Other Ambulatory Visit (HOSPITAL_BASED_OUTPATIENT_CLINIC_OR_DEPARTMENT_OTHER): Payer: Medicare Other | Admitting: Lab

## 2011-11-18 ENCOUNTER — Ambulatory Visit (HOSPITAL_BASED_OUTPATIENT_CLINIC_OR_DEPARTMENT_OTHER): Payer: Medicare Other | Admitting: Internal Medicine

## 2011-11-18 ENCOUNTER — Ambulatory Visit (HOSPITAL_BASED_OUTPATIENT_CLINIC_OR_DEPARTMENT_OTHER): Payer: Medicare Other

## 2011-11-18 VITALS — BP 141/88 | HR 63 | Temp 96.9°F | Resp 18 | Ht 67.0 in | Wt 141.3 lb

## 2011-11-18 DIAGNOSIS — Z5111 Encounter for antineoplastic chemotherapy: Secondary | ICD-10-CM

## 2011-11-18 DIAGNOSIS — C3490 Malignant neoplasm of unspecified part of unspecified bronchus or lung: Secondary | ICD-10-CM

## 2011-11-18 DIAGNOSIS — C3410 Malignant neoplasm of upper lobe, unspecified bronchus or lung: Secondary | ICD-10-CM

## 2011-11-18 LAB — CBC WITH DIFFERENTIAL/PLATELET
BASO%: 0.3 % (ref 0.0–2.0)
HCT: 37.7 % — ABNORMAL LOW (ref 38.4–49.9)
MCHC: 34.6 g/dL (ref 32.0–36.0)
MONO#: 0.1 10*3/uL (ref 0.1–0.9)
RBC: 3.61 10*6/uL — ABNORMAL LOW (ref 4.20–5.82)
RDW: 16.4 % — ABNORMAL HIGH (ref 11.0–14.6)
WBC: 4.6 10*3/uL (ref 4.0–10.3)
lymph#: 0.4 10*3/uL — ABNORMAL LOW (ref 0.9–3.3)

## 2011-11-18 LAB — COMPREHENSIVE METABOLIC PANEL
ALT: 17 U/L (ref 0–53)
CO2: 22 mEq/L (ref 19–32)
Calcium: 9.5 mg/dL (ref 8.4–10.5)
Chloride: 101 mEq/L (ref 96–112)
Potassium: 4.2 mEq/L (ref 3.5–5.3)
Sodium: 133 mEq/L — ABNORMAL LOW (ref 135–145)
Total Protein: 6.3 g/dL (ref 6.0–8.3)

## 2011-11-18 LAB — UA PROTEIN, DIPSTICK - CHCC: Protein, ur: 30 mg/dL

## 2011-11-18 MED ORDER — SODIUM CHLORIDE 0.9 % IJ SOLN
10.0000 mL | INTRAMUSCULAR | Status: DC | PRN
  Administered 2011-11-18: 10 mL
  Filled 2011-11-18: qty 10

## 2011-11-18 MED ORDER — DEXAMETHASONE SODIUM PHOSPHATE 10 MG/ML IJ SOLN
10.0000 mg | Freq: Once | INTRAMUSCULAR | Status: AC
Start: 2011-11-18 — End: 2011-11-18
  Administered 2011-11-18: 10 mg via INTRAVENOUS

## 2011-11-18 MED ORDER — CYANOCOBALAMIN 1000 MCG/ML IJ SOLN
1000.0000 ug | Freq: Once | INTRAMUSCULAR | Status: AC
  Administered 2011-11-18: 1000 ug via INTRAMUSCULAR

## 2011-11-18 MED ORDER — SODIUM CHLORIDE 0.9 % IV SOLN
15.0000 mg/kg | Freq: Once | INTRAVENOUS | Status: AC
  Administered 2011-11-18: 975 mg via INTRAVENOUS
  Filled 2011-11-18: qty 39

## 2011-11-18 MED ORDER — SODIUM CHLORIDE 0.9 % IV SOLN
500.0000 mg/m2 | Freq: Once | INTRAVENOUS | Status: AC
  Administered 2011-11-18: 875 mg via INTRAVENOUS
  Filled 2011-11-18: qty 35

## 2011-11-18 MED ORDER — HEPARIN SOD (PORK) LOCK FLUSH 100 UNIT/ML IV SOLN
500.0000 [IU] | Freq: Once | INTRAVENOUS | Status: AC | PRN
  Administered 2011-11-18: 500 [IU]
  Filled 2011-11-18: qty 5

## 2011-11-18 MED ORDER — ONDANSETRON 8 MG/50ML IVPB (CHCC)
8.0000 mg | Freq: Once | INTRAVENOUS | Status: AC
  Administered 2011-11-18: 8 mg via INTRAVENOUS

## 2011-11-18 MED ORDER — SODIUM CHLORIDE 0.9 % IV SOLN
Freq: Once | INTRAVENOUS | Status: AC
  Administered 2011-11-18: 10:00:00 via INTRAVENOUS

## 2011-11-18 NOTE — Progress Notes (Signed)
[facility] [phone]   Fax:([phone]  OFFICE PROGRESS NOTE  [name], MD [address] [place] [address]  DIAGNOSIS: Metastatic non-small cell lung cancer adenocarcinoma diagnosed in September 2010   PRIOR THERAPY:  #1 status post 6 cycles of systemic chemotherapy with carboplatin, Alimta and Avastin given every 3 weeks last dose given 04/23/2009 with disease stabilization.  #2 status post palliative radiotherapy to the mediastinum under the care of Dr. Michell Heinrich. The patient received a total dose of 3000 cGY and 12 fractions completed 01/21/2009.  #3 Maintenance chemotherapy with Alimta at 500 mg per meter squared and Avastin at 15 mg per kilogram given every 3 weeks status post 35 cycles.   CURRENT THERAPY:f maintenance chemotherapy with Alimta 500 mg/M2 and Avastin 15 mg/kg every 4 weeks,status post 5  cycles.  INTERVAL HISTORY: [name] 66 y.o. male returns to the clinic today for followup visit and to receive cycle #6 of his maintenance chemotherapy with monthly Alimta and Avastin. The patient is doing fine today with no specific complaints. He tolerated the last cycle of his systemic chemotherapy fairly well with no significant adverse effects. He denied having any significant weight loss or night sweats. He denied having any chest pain, shortness breath, cough or hemoptysis. He is expected to have surgical intervention was cataract surgery in 4-6 weeks and he would like to reschedule his treatment next time to avoid any interruption of his planned surgery.   MEDICAL HISTORY: Past Medical History  Diagnosis Date  . Hypertension   . Arthritis   . Syncope and collapse   . Lung cancer     lung ca dx 11/2008    ALLERGIES:  is allergic to percocet and zolpidem tartrate.  MEDICATIONS:  Current Outpatient Prescriptions  Medication Sig Dispense Refill  . amLODipine (NORVASC) 2.5 MG tablet Take 2.5 mg by mouth daily.         Marland Kitchen atenolol-chlorthalidone (TENORETIC) 50-25 MG per tablet Take 1 tablet by mouth Daily.      . B Complex-Biotin-FA (B-COMPLEX PO) Take by mouth. 1500mg  daily       . dexamethasone (DECADRON) 4 MG tablet Take 4 mg by mouth. 1 tablet day before, day of and day after chemo      . doxazosin (CARDURA) 2 MG tablet Take 2 mg by mouth at bedtime.        . FeFum-FePoly-FA-B Cmp-C-Biot (FOLIVANE-PLUS) CAPS Take 1 capsule by mouth daily.  90 capsule  3  . fexofenadine (ALLEGRA) 180 MG tablet Take 180 mg by mouth daily as needed.       . folic acid (FOLVITE) 1 MG tablet Take 1 tablet (1 mg total) by mouth daily.  90 tablet  3  . HYDROcodone-homatropine (HYCODAN) 5-1.5 MG/5ML syrup Take 5 mLs by mouth every 6 (six) hours as needed.  120 mL  0  . ibuprofen (ADVIL,MOTRIN) 200 MG tablet Take 200 mg by mouth every 6 (six) hours as needed.        . Multiple Vitamin (MULTIVITAMIN) tablet Take 1 tablet by mouth daily.        Marland Kitchen omeprazole (PRILOSEC) 40 MG capsule Take 1 capsule (40 mg total) by mouth daily.  90 capsule  1  . prochlorperazine (COMPAZINE) 10 MG tablet Take 10 mg by mouth every 6 (six) hours as needed.        Bernadette Hoit Sodium (SENOKOT S PO) Take by mouth. 2 tabs daily  SURGICAL HISTORY:  Past Surgical History  Procedure Date  . 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 Rotator cuff repair     REVIEW OF SYSTEMS:  A comprehensive review of systems was negative.   PHYSICAL EXAMINATION: General appearance: alert, cooperative and no distress Head: Normocephalic, without obvious abnormality, atraumatic Neck: no adenopathy Lymph nodes: Cervical, supraclavicular, and axillary nodes normal. Resp: clear to auscultation bilaterally Cardio: regular rate and rhythm, S1, S2 normal, no murmur, click, rub or gallop GI: soft, non-tender; bowel sounds normal; no masses,  no organomegaly Extremities: extremities normal, atraumatic, no cyanosis or edema Neurologic:  Alert and oriented X 3, normal strength and tone. Normal symmetric reflexes. Normal coordination and gait  ECOG PERFORMANCE STATUS: 0 - Asymptomatic  Blood pressure 141/88, pulse 63, temperature 96.9 F (36.1 C), temperature source Oral, resp. rate 18, height 5\' 7"  (1.702 m), weight 141 lb 4.8 oz (64.093 kg).  LABORATORY DATA: Lab Results  Component Value Date   WBC 4.6 11/18/2011   HGB 13.1 11/18/2011   HCT 37.7* 11/18/2011   MCV 104.3* 11/18/2011   PLT 166 11/18/2011      Chemistry      Component Value Date/Time   NA 140 10/20/2011 1011   K 3.7 10/20/2011 1011   CL 103 10/20/2011 1011   CO2 23 10/20/2011 1011   BUN 24* 10/20/2011 1011   CREATININE 1.30 10/20/2011 1011      Component Value Date/Time   CALCIUM 9.8 10/20/2011 1011   ALKPHOS 60 10/20/2011 1011   AST 22 10/20/2011 1011   ALT 15 10/20/2011 1011   BILITOT 0.5 10/20/2011 1011       RADIOGRAPHIC STUDIES: No results found.  ASSESSMENT: This is a very pleasant 67 years old white male with metastatic non-small cell lung cancer, adenocarcinoma status post systemic chemotherapy and has been on maintenance chemotherapy with Alimta and Avastin for a total of 41 cycle. The patient is tolerating his treatment fairly well.  PLAN: We'll proceed with cycle #42 today as scheduled. The patient would come back for followup visit in 4 weeks with the start of cycle #43 after repeating CT scan of the chest, abdomen and pelvis for restaging of his disease. We may consider holding his Avastin dose next cycle to avoid any interruption of his planned surgery. The patient was advised to call me immediately she has any concerning symptoms in the interval.  All questions were answered. The patient knows to call the clinic with any problems, questions or concerns. We can certainly see the patient much sooner if necessary.  I spent 15 minutes counseling the patient face to face. The total time spent in the appointment was 25 minutes.

## 2011-11-18 NOTE — Patient Instructions (Signed)
[facility] Discharge Instructions for Patients Receiving Chemotherapy  Today you received the following chemotherapy agents Avastin and Alimta  To help prevent nausea and vomiting after your treatment, we encourage you to take your nausea medication as prescribed.   If you develop nausea and vomiting that is not controlled by your nausea medication, call the clinic. If it is after clinic hours your family physician or the after hours number for the clinic or go to the Emergency Department.   BELOW ARE SYMPTOMS THAT SHOULD BE REPORTED IMMEDIATELY:  *FEVER GREATER THAN 100.5 F  *CHILLS WITH OR WITHOUT FEVER  NAUSEA AND VOMITING THAT IS NOT CONTROLLED WITH YOUR NAUSEA MEDICATION  *UNUSUAL SHORTNESS OF BREATH  *UNUSUAL BRUISING OR BLEEDING  TENDERNESS IN MOUTH AND THROAT WITH OR WITHOUT PRESENCE OF ULCERS  *URINARY PROBLEMS  *BOWEL PROBLEMS  UNUSUAL RASH Items with * indicate a potential emergency and should be followed up as soon as possible.  One of the nurses will contact you 24 hours after your treatment. Please let the nurse know about any problems that you may have experienced. Feel free to call the clinic you have any questions or concerns. The clinic phone number is [phone].   I have been informed and understand all the instructions given to me. I know to contact the clinic, my physician, or go to the Emergency Department if any problems should occur. I do not have any questions at this time, but understand that I may call the clinic during office hours   should I have any questions or need assistance in obtaining follow up care.    __________________________________________  _____________  __________ Signature of Patient or Authorized Representative            Date                   Time    __________________________________________ Nurse's Signature    

## 2011-11-22 ENCOUNTER — Telehealth: Payer: Self-pay | Admitting: *Deleted

## 2011-11-22 NOTE — Telephone Encounter (Signed)
Pt called wanting to know if Dr Donnald Garre was okay with him getting his tx in 3 weeks instead of in 4 weeks because the week he was due (9/19) he is going to a wedding.  Per Dr Donnald Garre, okay to give ALIMTA ONLY on 9/12 with lab and [facility] visit, and CT scan 2-3 days before.  Pt is having surgery at the beginning on October so he will not get Avastin his next cycle.  Sent msg to [place] in scheduling with changes.  SLJ

## 2011-11-24 ENCOUNTER — Telehealth: Payer: Self-pay | Admitting: Internal Medicine

## 2011-11-24 NOTE — Telephone Encounter (Signed)
lmonvm for pt re appt for appts for 9/19 and ct on 9/17. Schedule/referral mailed.

## 2011-11-26 ENCOUNTER — Telehealth: Payer: Self-pay | Admitting: Internal Medicine

## 2011-11-26 ENCOUNTER — Other Ambulatory Visit: Payer: Self-pay | Admitting: *Deleted

## 2011-11-26 NOTE — Telephone Encounter (Signed)
S/w pt re new appts for 9/5 ct and 9/10 lb/fu/tx. Pt given d/t/location/instructions and is aware he will receive more appt after seeing MM. appts for 9/19 were cx'd per Judeth Cornfield, desk nurse and appts for [place] will remain as they are.

## 2011-12-02 ENCOUNTER — Ambulatory Visit (HOSPITAL_COMMUNITY)
Admission: RE | Admit: 2011-12-02 | Discharge: 2011-12-02 | Disposition: A | Discharge status: Home / Self Care | Payer: Medicare Other | Source: Ambulatory Visit | Attending: Internal Medicine | Admitting: Internal Medicine

## 2011-12-02 DIAGNOSIS — C3490 Malignant neoplasm of unspecified part of unspecified bronchus or lung: Secondary | ICD-10-CM | POA: Insufficient documentation

## 2011-12-02 DIAGNOSIS — I2510 Atherosclerotic heart disease of native coronary artery without angina pectoris: Secondary | ICD-10-CM | POA: Insufficient documentation

## 2011-12-02 DIAGNOSIS — C3410 Malignant neoplasm of upper lobe, unspecified bronchus or lung: Secondary | ICD-10-CM

## 2011-12-02 DIAGNOSIS — R599 Enlarged lymph nodes, unspecified: Secondary | ICD-10-CM | POA: Insufficient documentation

## 2011-12-02 DIAGNOSIS — C779 Secondary and unspecified malignant neoplasm of lymph node, unspecified: Secondary | ICD-10-CM | POA: Insufficient documentation

## 2011-12-02 DIAGNOSIS — J984 Other disorders of lung: Secondary | ICD-10-CM | POA: Insufficient documentation

## 2011-12-02 DIAGNOSIS — I700 Atherosclerosis of aorta: Secondary | ICD-10-CM | POA: Insufficient documentation

## 2011-12-02 MED ORDER — IOHEXOL 300 MG/ML  SOLN
100.0000 mL | Freq: Once | INTRAMUSCULAR | Status: AC | PRN
  Administered 2011-12-02: 100 mL via INTRAVENOUS

## 2011-12-06 ENCOUNTER — Other Ambulatory Visit: Payer: Self-pay | Admitting: Medical Oncology

## 2011-12-06 NOTE — Progress Notes (Signed)
Orders reviewed

## 2011-12-07 ENCOUNTER — Ambulatory Visit (HOSPITAL_BASED_OUTPATIENT_CLINIC_OR_DEPARTMENT_OTHER): Payer: Medicare Other

## 2011-12-07 ENCOUNTER — Other Ambulatory Visit (HOSPITAL_BASED_OUTPATIENT_CLINIC_OR_DEPARTMENT_OTHER): Payer: Medicare Other | Admitting: Lab

## 2011-12-07 ENCOUNTER — Telehealth: Payer: Self-pay | Admitting: Internal Medicine

## 2011-12-07 ENCOUNTER — Ambulatory Visit (HOSPITAL_BASED_OUTPATIENT_CLINIC_OR_DEPARTMENT_OTHER): Payer: Medicare Other | Admitting: Internal Medicine

## 2011-12-07 VITALS — BP 143/85 | HR 64 | Temp 97.9°F | Resp 18 | Ht 67.0 in | Wt 141.2 lb

## 2011-12-07 DIAGNOSIS — C3410 Malignant neoplasm of upper lobe, unspecified bronchus or lung: Secondary | ICD-10-CM

## 2011-12-07 DIAGNOSIS — Z5111 Encounter for antineoplastic chemotherapy: Secondary | ICD-10-CM

## 2011-12-07 LAB — CBC WITH DIFFERENTIAL/PLATELET
BASO%: 0 % (ref 0.0–2.0)
EOS%: 0 % (ref 0.0–7.0)
HCT: 33.9 % — ABNORMAL LOW (ref 38.4–49.9)
LYMPH%: 7.3 % — ABNORMAL LOW (ref 14.0–49.0)
MCH: 34.7 pg — ABNORMAL HIGH (ref 27.2–33.4)
MCHC: 35.1 g/dL (ref 32.0–36.0)
MONO#: 0.2 10*3/uL (ref 0.1–0.9)
NEUT%: 89.2 % — ABNORMAL HIGH (ref 39.0–75.0)
Platelets: 185 10*3/uL (ref 140–400)
RBC: 3.43 10*6/uL — ABNORMAL LOW (ref 4.20–5.82)
WBC: 5.8 10*3/uL (ref 4.0–10.3)
nRBC: 0 % (ref 0–0)

## 2011-12-07 LAB — COMPREHENSIVE METABOLIC PANEL (CC13)
Albumin: 3.9 g/dL (ref 3.5–5.0)
Alkaline Phosphatase: 63 U/L (ref 40–150)
BUN: 23 mg/dL (ref 7.0–26.0)
CO2: 22 mEq/L (ref 22–29)
Calcium: 9.5 mg/dL (ref 8.4–10.4)
Chloride: 100 mEq/L (ref 98–107)
Glucose: 153 mg/dl — ABNORMAL HIGH (ref 70–99)
Potassium: 4.1 mEq/L (ref 3.5–5.1)
Total Protein: 6.4 g/dL (ref 6.4–8.3)

## 2011-12-07 MED ORDER — HEPARIN SOD (PORK) LOCK FLUSH 100 UNIT/ML IV SOLN
500.0000 [IU] | Freq: Once | INTRAVENOUS | Status: AC | PRN
  Administered 2011-12-07: 500 [IU]
  Filled 2011-12-07: qty 5

## 2011-12-07 MED ORDER — SODIUM CHLORIDE 0.9 % IJ SOLN
10.0000 mL | INTRAMUSCULAR | Status: DC | PRN
  Administered 2011-12-07: 10 mL
  Filled 2011-12-07: qty 10

## 2011-12-07 MED ORDER — SODIUM CHLORIDE 0.9 % IV SOLN
Freq: Once | INTRAVENOUS | Status: AC
  Administered 2011-12-07: 13:00:00 via INTRAVENOUS

## 2011-12-07 MED ORDER — SODIUM CHLORIDE 0.9 % IV SOLN
500.0000 mg/m2 | Freq: Once | INTRAVENOUS | Status: AC
  Administered 2011-12-07: 875 mg via INTRAVENOUS
  Filled 2011-12-07: qty 35

## 2011-12-07 MED ORDER — ONDANSETRON 8 MG/50ML IVPB (CHCC)
8.0000 mg | Freq: Once | INTRAVENOUS | Status: AC
  Administered 2011-12-07: 8 mg via INTRAVENOUS

## 2011-12-07 MED ORDER — DEXAMETHASONE SODIUM PHOSPHATE 10 MG/ML IJ SOLN
10.0000 mg | Freq: Once | INTRAMUSCULAR | Status: AC
  Administered 2011-12-07: 10 mg via INTRAVENOUS

## 2011-12-07 NOTE — Telephone Encounter (Signed)
Gave pt appt for October lab, chemo and MD, pt requested that all appts be moved to Wednesday , he wants to all appt same day so he dont have to come back the next day

## 2011-12-07 NOTE — Patient Instructions (Signed)
[facility] Discharge Instructions for Patients Receiving Chemotherapy  Today you received the following chemotherapy agents alimita  To help prevent nausea and vomiting after your treatment, we encourage you to take your nausea medication .   If you develop nausea and vomiting that is not controlled by your nausea medication, call the clinic. If it is after clinic hours your family physician or the after hours number for the clinic or go to the Emergency Department.   BELOW ARE SYMPTOMS THAT SHOULD BE REPORTED IMMEDIATELY:  *FEVER GREATER THAN 100.5 F  *CHILLS WITH OR WITHOUT FEVER  NAUSEA AND VOMITING THAT IS NOT CONTROLLED WITH YOUR NAUSEA MEDICATION  *UNUSUAL SHORTNESS OF BREATH  *UNUSUAL BRUISING OR BLEEDING  TENDERNESS IN MOUTH AND THROAT WITH OR WITHOUT PRESENCE OF ULCERS  *URINARY PROBLEMS  *BOWEL PROBLEMS  UNUSUAL RASH Items with * indicate a potential emergency and should be followed up as soon as possible.  One of the nurses will contact you 24 hours after your treatment. Please let the nurse know about any problems that you may have experienced. Feel free to call the clinic you have any questions or concerns. The clinic phone number is [phone].   I have been informed and understand all the instructions given to me. I know to contact the clinic, my physician, or go to the Emergency Department if any problems should occur. I do not have any questions at this time, but understand that I may call the clinic during office hours   should I have any questions or need assistance in obtaining follow up care.    __________________________________________  _____________  __________ Signature of Patient or Authorized Representative            Date                   Time    __________________________________________ Nurse's Signature

## 2011-12-07 NOTE — Patient Instructions (Addendum)
Her scan showed no evidence for disease progression. Next chemotherapy scheduled for 01/12/2012. No treatment with the Avastin today in preparation for your surgery

## 2011-12-07 NOTE — Progress Notes (Signed)
[facility] Telephone:([phone]) [phone]   Fax:([phone]  OFFICE PROGRESS NOTE  [name], MD [address] [place] [address]  DIAGNOSIS: Metastatic non-small cell lung cancer adenocarcinoma diagnosed in September 2010   PRIOR THERAPY:  #1 status post 6 cycles of systemic chemotherapy with carboplatin, Alimta and Avastin given every 3 weeks last dose given 04/23/2009 with disease stabilization.  #2 status post palliative radiotherapy to the mediastinum under the care of Dr. Michell Heinrich. The patient received a total dose of 3000 cGY and 12 fractions completed 01/21/2009.  #3 Maintenance chemotherapy with Alimta at 500 mg per meter squared and Avastin at 15 mg per kilogram given every 3 weeks status post 35 cycles.   CURRENT THERAPY:f maintenance chemotherapy with Alimta 500 mg/M2 and Avastin 15 mg/kg every 4 weeks,status post 6 cycles.  INTERVAL HISTORY: [name] 66 y.o. male returns to the clinic today for followup visit accompanied by his wife. The patient is feeling fine today with no specific complaints. He denied having any significant weight loss or night sweats. Has no chest pain, shortness breath, cough or hemoptysis. He plans to have an eye surgery as well as surgery of his knee in few weeks and he would like to delay the next cycle of his treatment after his surgery. The patient has repeat CT scan of the chest, abdomen and pelvis performed recently and he is here for evaluation and discussion of his scan results.  MEDICAL HISTORY: Past Medical History  Diagnosis Date  . Hypertension   . Arthritis   . Syncope and collapse   . Lung cancer     lung ca dx 11/2008    ALLERGIES:  is allergic to percocet and zolpidem tartrate.  MEDICATIONS:  Current Outpatient Prescriptions  Medication Sig Dispense Refill  . amLODipine (NORVASC) 2.5 MG tablet Take 2.5 mg by mouth daily.        Marland Kitchen atenolol-chlorthalidone (TENORETIC) 50-25 MG per tablet  Take 1 tablet by mouth Daily.      . B Complex-Biotin-FA (B-COMPLEX PO) Take by mouth. 1500mg  daily       . dexamethasone (DECADRON) 4 MG tablet Take 4 mg by mouth. 1 tablet day before, day of and day after chemo      . doxazosin (CARDURA) 2 MG tablet Take 2 mg by mouth at bedtime.        . FeFum-FePoly-FA-B Cmp-C-Biot (FOLIVANE-PLUS) CAPS Take 1 capsule by mouth daily.  90 capsule  3  . fexofenadine (ALLEGRA) 180 MG tablet Take 180 mg by mouth daily as needed.       . folic acid (FOLVITE) 1 MG tablet Take 1 tablet (1 mg total) by mouth daily.  90 tablet  3  . HYDROcodone-homatropine (HYCODAN) 5-1.5 MG/5ML syrup Take 5 mLs by mouth every 6 (six) hours as needed.  120 mL  0  . ibuprofen (ADVIL,MOTRIN) 200 MG tablet Take 200 mg by mouth every 6 (six) hours as needed.        . Multiple Vitamin (MULTIVITAMIN) tablet Take 1 tablet by mouth daily.        Marland Kitchen omeprazole (PRILOSEC) 40 MG capsule Take 1 capsule (40 mg total) by mouth daily.  90 capsule  1  . prochlorperazine (COMPAZINE) 10 MG tablet Take 10 mg by mouth every 6 (six) hours as needed.        Bernadette Hoit Sodium (SENOKOT S PO) Take by mouth. 2 tabs daily  SURGICAL HISTORY:  Past Surgical History  Procedure Date  . 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 Rotator cuff repair     REVIEW OF SYSTEMS:  A comprehensive review of systems was negative.   PHYSICAL EXAMINATION: General appearance: alert, cooperative and no distress Head: Normocephalic, without obvious abnormality, atraumatic Neck: no adenopathy Lymph nodes: Cervical, supraclavicular, and axillary nodes normal. Resp: clear to auscultation bilaterally Back: negative, symmetric, no curvature. ROM normal. No CVA tenderness. Cardio: regular rate and rhythm, S1, S2 normal, no murmur, click, rub or gallop GI: soft, non-tender; bowel sounds normal; no masses,  no organomegaly Extremities: extremities normal, atraumatic, no cyanosis or  edema Neurologic: Alert and oriented X 3, normal strength and tone. Normal symmetric reflexes. Normal coordination and gait  ECOG PERFORMANCE STATUS: 0 - Asymptomatic  Blood pressure 143/85, pulse 64, temperature 97.9 F (36.6 C), temperature source Oral, resp. rate 18, height 5\' 7"  (1.702 m), weight 141 lb 3.2 oz (64.048 kg).  LABORATORY DATA: Lab Results  Component Value Date   WBC 5.8 12/07/2011   HGB 11.9* 12/07/2011   HCT 33.9* 12/07/2011   MCV 98.8* 12/07/2011   PLT 185 12/07/2011      Chemistry      Component Value Date/Time   NA 133* 11/18/2011 0858   K 4.2 11/18/2011 0858   CL 101 11/18/2011 0858   CO2 22 11/18/2011 0858   BUN 19 11/18/2011 0858   CREATININE 1.34 11/18/2011 0858      Component Value Date/Time   CALCIUM 9.5 11/18/2011 0858   ALKPHOS 55 11/18/2011 0858   AST 25 11/18/2011 0858   ALT 17 11/18/2011 0858   BILITOT 0.5 11/18/2011 0858       RADIOGRAPHIC STUDIES: Ct Chest W Contrast  12/02/2011  *RADIOLOGY REPORT*  Clinical Data:  Lung cancer with nodal metastases, chemotherapy ongoing, XRT complete 02/2009  CT CHEST, ABDOMEN AND PELVIS WITH CONTRAST  Technique:  Multidetector CT imaging of the chest, abdomen and pelvis was performed following the standard protocol during bolus administration of intravenous contrast.  Contrast: OMNIPAQUE IOHEXOL 300 MG/ML  SOLN  Comparison:  09/20/2011  CT CHEST  Findings:  Right paramediastinal radiation changes.  Pleural parenchymal scarring in the posterior left upper lobe (series 4/image 11).  Mild paraseptal emphysematous changes.  No pleural effusion or pneumothorax.  Visualized thyroid is unremarkable.  Heart is normal in size.  No pericardial effusion.  Coronary atherosclerosis.  Atherosclerotic calcifications of the aortic arch.  Small right paratracheal lymph nodes measuring up to 7 mm short axis, unchanged.  No suspicious hilar or axillary lymphadenopathy.  Right chest port.  Degenerate changes of the thoracic spine.   IMPRESSION: No evidence of recurrent or metastatic disease in the chest.  Stable right paramediastinal radiation changes.  CT ABDOMEN AND PELVIS  Findings:  Liver, spleen, pancreas, and adrenal glands are within normal limits.  Gallbladder is underdistended.  No intrahepatic or extrahepatic ductal dilatation.  12 mm interpolar left renal cyst (series 2/image 70). Left upper pole cortical scarring.  Right kidney is unremarkable.  No hydronephrosis.  No evidence of bowel obstruction.  Mild colonic diverticulosis, without associated inflammatory changes.  Atherosclerotic calcifications of the abdominal aorta and branch vessels.  Mildly prominent/borderline enlarged retroperitoneal lymph nodes in the aortocaval region, measuring up to 10 mm short axis (series 2/image 64), stable versus minimally decreased.  Prostate is notable for enlargement of the central gland, which indents the base of the bladder.  Bladder is mildly thick-walled, possibly reflecting chronic outlet obstruction.  Mild degenerative changes of the lumbar spine.  IMPRESSION: Borderline enlarged retroperitoneal lymph nodes measuring up to 10 mm short axis, stable versus minimally decreased.  Otherwise, no evidence of metastatic disease in the abdomen/pelvis.   Original Report Authenticated By: Charline Bills, M.D.    ASSESSMENT: This is a very pleasant 66 years old white male with metastatic non-small cell lung cancer, adenocarcinoma currently on maintenance treatment with Alimta and Avastin status post total of 41 cycles. The patient is doing fine and he has no evidence for disease progression.  PLAN: I discussed the scan results with the patient and his wife. I recommended for him to proceed with his treatment today as scheduled but with only single agent Alimta. We will hold the Avastin today because of the expected surgery in few weeks. I will schedule his next cycle of chemotherapy to be on 01/12/2012.  He would come back for followup visit  at that time. He was advised to call me immediately she has any concerning symptoms in the interval.  All questions were answered. The patient knows to call the clinic with any problems, questions or concerns. We can certainly see the patient much sooner if necessary.  I spent 15 minutes counseling the patient face to face. The total time spent in the appointment was 25 minutes.

## 2011-12-13 ENCOUNTER — Telehealth: Payer: Self-pay | Admitting: Medical Oncology

## 2011-12-13 NOTE — Telephone Encounter (Addendum)
I faxed oncology clearance to [facility] and pt notified .

## 2011-12-13 NOTE — Telephone Encounter (Signed)
Needs Dr Donnald Garre to complete from for orthopedic- given to Dr Donnald Garre

## 2011-12-14 ENCOUNTER — Other Ambulatory Visit (HOSPITAL_COMMUNITY): Payer: Medicare Other

## 2011-12-14 ENCOUNTER — Telehealth: Payer: Self-pay | Admitting: Internal Medicine

## 2011-12-14 NOTE — Telephone Encounter (Signed)
Called patient left message regarding appt in October 2013 lab , chemo and MD

## 2011-12-15 ENCOUNTER — Encounter (HOSPITAL_BASED_OUTPATIENT_CLINIC_OR_DEPARTMENT_OTHER): Payer: Self-pay | Admitting: *Deleted

## 2011-12-16 ENCOUNTER — Other Ambulatory Visit: Payer: Medicare Other | Admitting: Lab

## 2011-12-16 ENCOUNTER — Ambulatory Visit: Payer: Medicare Other | Admitting: Internal Medicine

## 2011-12-16 ENCOUNTER — Ambulatory Visit: Payer: Medicare Other

## 2011-12-17 ENCOUNTER — Encounter (HOSPITAL_BASED_OUTPATIENT_CLINIC_OR_DEPARTMENT_OTHER): Payer: Self-pay | Admitting: *Deleted

## 2011-12-17 NOTE — Progress Notes (Signed)
NPO AFTER MN WITH EXCEPTION WATER/ GATORADE/ BLACK COFFEE (NO CREAM/ MILK PRODUCTS) UNTIL 0730. ARRIVES AT 1345. NEEDS ISTAT AND EKG. CURRENT CHEST CT IN EPIC AND CHART. WILL TAKE NORVASC AM OF SURG W/ SIP OF WATER. 

## 2011-12-20 ENCOUNTER — Telehealth: Payer: Self-pay | Admitting: Medical Oncology

## 2011-12-20 ENCOUNTER — Other Ambulatory Visit: Payer: Self-pay | Admitting: Medical Oncology

## 2011-12-20 NOTE — Telephone Encounter (Signed)
Pt asking if we received med clearance form from [facility]. I told him no . I tried [facility] and they were closed.

## 2011-12-21 ENCOUNTER — Telehealth: Payer: Self-pay | Admitting: Internal Medicine

## 2011-12-21 ENCOUNTER — Ambulatory Visit (HOSPITAL_BASED_OUTPATIENT_CLINIC_OR_DEPARTMENT_OTHER)
Admission: RE | Admit: 2011-12-21 | Discharge: 2011-12-21 | Disposition: A | Discharge status: Home / Self Care | Payer: Medicare Other | Source: Ambulatory Visit | Attending: Orthopedic Surgery | Admitting: Orthopedic Surgery

## 2011-12-21 ENCOUNTER — Encounter (HOSPITAL_BASED_OUTPATIENT_CLINIC_OR_DEPARTMENT_OTHER): Payer: Self-pay | Admitting: Anesthesiology

## 2011-12-21 ENCOUNTER — Other Ambulatory Visit: Payer: Self-pay

## 2011-12-21 ENCOUNTER — Ambulatory Visit (HOSPITAL_BASED_OUTPATIENT_CLINIC_OR_DEPARTMENT_OTHER): Payer: Medicare Other | Admitting: Anesthesiology

## 2011-12-21 ENCOUNTER — Encounter (HOSPITAL_BASED_OUTPATIENT_CLINIC_OR_DEPARTMENT_OTHER): Payer: Self-pay

## 2011-12-21 ENCOUNTER — Encounter (HOSPITAL_BASED_OUTPATIENT_CLINIC_OR_DEPARTMENT_OTHER)
Admission: RE | Disposition: A | Discharge status: Home / Self Care | Payer: Self-pay | Source: Ambulatory Visit | Attending: Orthopedic Surgery

## 2011-12-21 DIAGNOSIS — IMO0002 Reserved for concepts with insufficient information to code with codable children: Secondary | ICD-10-CM | POA: Insufficient documentation

## 2011-12-21 DIAGNOSIS — X58XXXA Exposure to other specified factors, initial encounter: Secondary | ICD-10-CM | POA: Insufficient documentation

## 2011-12-21 DIAGNOSIS — K219 Gastro-esophageal reflux disease without esophagitis: Secondary | ICD-10-CM | POA: Insufficient documentation

## 2011-12-21 DIAGNOSIS — M1710 Unilateral primary osteoarthritis, unspecified knee: Secondary | ICD-10-CM | POA: Insufficient documentation

## 2011-12-21 DIAGNOSIS — M23329 Other meniscus derangements, posterior horn of medial meniscus, unspecified knee: Secondary | ICD-10-CM | POA: Diagnosis present

## 2011-12-21 DIAGNOSIS — Z79899 Other long term (current) drug therapy: Secondary | ICD-10-CM | POA: Insufficient documentation

## 2011-12-21 DIAGNOSIS — M1711 Unilateral primary osteoarthritis, right knee: Secondary | ICD-10-CM | POA: Diagnosis present

## 2011-12-21 HISTORY — DX: Unspecified cataract: H26.9

## 2011-12-21 HISTORY — DX: Malignant neoplasm of unspecified part of unspecified bronchus or lung: C34.90

## 2011-12-21 HISTORY — DX: Benign prostatic hyperplasia without lower urinary tract symptoms: N40.0

## 2011-12-21 HISTORY — DX: Unspecified tear of unspecified meniscus, current injury, unspecified knee, initial encounter: S83.209A

## 2011-12-21 HISTORY — PX: KNEE ARTHROSCOPY: SHX127

## 2011-12-21 LAB — POCT I-STAT 4, (NA,K, GLUC, HGB,HCT)
HCT: 34 % — ABNORMAL LOW (ref 39.0–52.0)
Hemoglobin: 11.6 g/dL — ABNORMAL LOW (ref 13.0–17.0)

## 2011-12-21 SURGERY — ARTHROSCOPY, KNEE
Anesthesia: General | Site: Knee | Laterality: Right | Wound class: Clean

## 2011-12-21 MED ORDER — PROMETHAZINE HCL 25 MG/ML IJ SOLN: 6.2500 mg | INTRAMUSCULAR | Status: DC | PRN

## 2011-12-21 MED ORDER — BACITRACIN-NEOMYCIN-POLYMYXIN 400-5-5000 EX OINT
TOPICAL_OINTMENT | CUTANEOUS | Status: DC | PRN
  Administered 2011-12-21: 1 via TOPICAL

## 2011-12-21 MED ORDER — ONDANSETRON HCL 4 MG/2ML IJ SOLN
INTRAMUSCULAR | Status: DC | PRN
  Administered 2011-12-21: 4 mg via INTRAVENOUS

## 2011-12-21 MED ORDER — PROPOFOL 10 MG/ML IV BOLUS
INTRAVENOUS | Status: DC | PRN
  Administered 2011-12-21: 200 mg via INTRAVENOUS
  Administered 2011-12-21: 50 mg via INTRAVENOUS

## 2011-12-21 MED ORDER — BUPIVACAINE-EPINEPHRINE 0.25% -1:200000 IJ SOLN
INTRAMUSCULAR | Status: DC | PRN
  Administered 2011-12-21: 30 mL

## 2011-12-21 MED ORDER — SODIUM CHLORIDE 0.9 % IR SOLN
Status: DC | PRN
  Administered 2011-12-21: 6000 mL

## 2011-12-21 MED ORDER — DEXAMETHASONE SODIUM PHOSPHATE 4 MG/ML IJ SOLN
INTRAMUSCULAR | Status: DC | PRN
  Administered 2011-12-21: 10 mg via INTRAVENOUS

## 2011-12-21 MED ORDER — CEFAZOLIN SODIUM-DEXTROSE 2-3 GM-% IV SOLR: 2.0000 g | INTRAVENOUS | Status: DC

## 2011-12-21 MED ORDER — LACTATED RINGERS IV SOLN
INTRAVENOUS | Status: DC
  Administered 2011-12-21 (×3): via INTRAVENOUS

## 2011-12-21 MED ORDER — LIDOCAINE HCL (CARDIAC) 20 MG/ML IV SOLN
INTRAVENOUS | Status: DC | PRN
  Administered 2011-12-21: 75 mg via INTRAVENOUS

## 2011-12-21 MED ORDER — FENTANYL CITRATE 0.05 MG/ML IJ SOLN
INTRAMUSCULAR | Status: DC | PRN
  Administered 2011-12-21: 50 ug via INTRAVENOUS
  Administered 2011-12-21: 25 ug via INTRAVENOUS
  Administered 2011-12-21: 50 ug via INTRAVENOUS

## 2011-12-21 MED ORDER — FENTANYL CITRATE 0.05 MG/ML IJ SOLN: 25.0000 ug | INTRAMUSCULAR | Status: DC | PRN

## 2011-12-21 MED ORDER — KETOROLAC TROMETHAMINE 30 MG/ML IJ SOLN: 15.0000 mg | Freq: Once | INTRAMUSCULAR | Status: DC | PRN

## 2011-12-21 MED ORDER — LACTATED RINGERS IV SOLN: INTRAVENOUS | Status: DC

## 2011-12-21 MED ORDER — CHLORHEXIDINE GLUCONATE 4 % EX LIQD: 60.0000 mL | Freq: Once | CUTANEOUS | Status: DC

## 2011-12-21 SURGICAL SUPPLY — 35 items
BANDAGE ELASTIC 4 VELCRO ST LF (GAUZE/BANDAGES/DRESSINGS) ×2 IMPLANT
BANDAGE ELASTIC 6 VELCRO ST LF (GAUZE/BANDAGES/DRESSINGS) ×2 IMPLANT
BLADE GREAT WHITE 4.2 (BLADE) ×2 IMPLANT
BNDG COHESIVE 6X5 TAN NS LF (GAUZE/BANDAGES/DRESSINGS) ×2 IMPLANT
CANISTER SUCT LVC 12 LTR MEDI- (MISCELLANEOUS) ×2 IMPLANT
CANISTER SUCTION 2500CC (MISCELLANEOUS) ×2 IMPLANT
CLOTH BEACON ORANGE TIMEOUT ST (SAFETY) ×2 IMPLANT
DRAPE ARTHROSCOPY W/POUCH 114 (DRAPES) ×2 IMPLANT
DRAPE LG THREE QUARTER DISP (DRAPES) ×2 IMPLANT
DRSG EMULSION OIL 3X3 NADH (GAUZE/BANDAGES/DRESSINGS) ×2 IMPLANT
DRSG PAD ABDOMINAL 8X10 ST (GAUZE/BANDAGES/DRESSINGS) ×5 IMPLANT
DURAPREP 26ML APPLICATOR (WOUND CARE) ×2 IMPLANT
GAUZE SPONGE 4X4 12PLY STRL LF (GAUZE/BANDAGES/DRESSINGS) ×1 IMPLANT
GLOVE ECLIPSE 8.0 STRL XLNG CF (GLOVE) ×2 IMPLANT
GLOVE INDICATOR 6.5 STRL GRN (GLOVE) ×1 IMPLANT
GLOVE INDICATOR 7.0 STRL GRN (GLOVE) ×1 IMPLANT
GLOVE INDICATOR 8.5 STRL (GLOVE) ×4 IMPLANT
GLOVE SURG ORTHO 6.0 STRL STRW (GLOVE) ×2 IMPLANT
GOWN PREVENTION PLUS LG XLONG (DISPOSABLE) ×2 IMPLANT
GOWN STRL REIN XL XLG (GOWN DISPOSABLE) ×2 IMPLANT
IV NS IRRIG 3000ML ARTHROMATIC (IV SOLUTION) ×4 IMPLANT
KNEE WRAP E Z 3 GEL PACK (MISCELLANEOUS) ×2 IMPLANT
MINI VAC (SURGICAL WAND) ×1 IMPLANT
PACK ARTHROSCOPY DSU (CUSTOM PROCEDURE TRAY) ×2 IMPLANT
PACK BASIN DAY SURGERY FS (CUSTOM PROCEDURE TRAY) ×2 IMPLANT
PADDING CAST ABS 6INX4YD NS (CAST SUPPLIES) ×1
PADDING CAST ABS COTTON 6X4 NS (CAST SUPPLIES) IMPLANT
PADDING CAST COTTON 6X4 STRL (CAST SUPPLIES) ×2 IMPLANT
SET ARTHROSCOPY TUBING (MISCELLANEOUS) ×2
SET ARTHROSCOPY TUBING LN (MISCELLANEOUS) ×1 IMPLANT
SPONGE GAUZE 4X4 12PLY (GAUZE/BANDAGES/DRESSINGS) ×2 IMPLANT
SUT ETHILON 3 0 PS 1 (SUTURE) ×2 IMPLANT
TOWEL OR 17X24 6PK STRL BLUE (TOWEL DISPOSABLE) ×2 IMPLANT
WAND 90 DEG TURBOVAC W/CORD (SURGICAL WAND) ×2 IMPLANT
WATER STERILE IRR 500ML POUR (IV SOLUTION) ×2 IMPLANT

## 2011-12-21 NOTE — Telephone Encounter (Signed)
lm with new appt info for 10/22    aom

## 2011-12-21 NOTE — Brief Op Note (Signed)
12/21/2011  2:34 PM  PATIENT:  Nicoletta Ba  66 y.o. male  PRE-OPERATIVE DIAGNOSIS:  RIGHT KNEE MEDIAL MENISCAL TEAR and severe Osteoarthritis  POST-OPERATIVE DIAGNOSIS:  RIGHT KNEE MEDIAL MENISCAL TEAR and severe Osteoarthritis  PROCEDURE:  Procedure(s) (LRB) with comments: ARTHROSCOPY KNEE (Right) - WITH MEDIAL MENISECTOMY and Abraision Chondroplasty and Microfracture of Medial Femoral Condyle. SURGEON:  Surgeon(s) and Role:    * Jacki Cones, MD - Primary   ASSISTANTS: OR Tech  ANESTHESIA:   general  EBL:  Total I/O In: 1100 [I.V.:1100] Out: -   BLOOD ADMINISTERED:none  DRAINS: none   LOCAL MEDICATIONS USED:  MARCAINE 0.25% , 30cc with Epinephrine.     SPECIMEN:  No Specimen  DISPOSITION OF SPECIMEN:  N/A  COUNTS:  YES  TOURNIQUET:  * No tourniquets in log *  DICTATION: .Other Dictation: Dictation Number [id number]  PLAN OF CARE: Discharge to home after PACU  PATIENT DISPOSITION:  PACU - hemodynamically stable.   Delay start of Pharmacological VTE agent (>24hrs) due to surgical blood loss or risk of bleeding: yes

## 2011-12-21 NOTE — Telephone Encounter (Signed)
I called [facility] and requested form for Dr Donnald Garre

## 2011-12-21 NOTE — Transfer of Care (Signed)
Immediate Anesthesia Transfer of Care Note  Patient: [name]  Procedure(s) Performed: Procedure(s) (LRB): ARTHROSCOPY KNEE (Right)  Patient Location: Patient transported to PACU with oxygen via face mask at 4 Liters / Min  Anesthesia Type: General  Level of Consciousness: awake and alert   Airway & Oxygen Therapy: Patient Spontanous Breathing and Patient connected to face mask oxygen  Post-op Assessment: Report given to PACU RN and Post -op Vital signs reviewed and stable  Post vital signs: Reviewed and stable  Dentition: Teeth and oropharynx remain in pre-op condition  Complications: No apparent anesthesia complications

## 2011-12-21 NOTE — Progress Notes (Signed)
Dentures returned to pt 

## 2011-12-21 NOTE — H&P (View-Only) (Signed)
NPO AFTER MN WITH EXCEPTION WATER/ GATORADE/ BLACK COFFEE (NO CREAM/ MILK PRODUCTS) UNTIL 0730. ARRIVES AT 1345. NEEDS ISTAT AND EKG. CURRENT CHEST CT IN EPIC AND CHART. WILL TAKE NORVASC AM OF SURG W/ SIP OF WATER.

## 2011-12-21 NOTE — Interval H&P Note (Signed)
History and Physical Interval Note:  12/21/2011 1:34 PM  [name]  has presented today for surgery, with the diagnosis of RIGHT KNEE MEDIAL MENISCAL TEAR  The various methods of treatment have been discussed with the patient and family. After consideration of risks, benefits and other options for treatment, the patient has consented to  Procedure(s) (LRB) with comments: ARTHROSCOPY KNEE (Right) - WITH MEDIAL MENISECTOMY as a surgical intervention .  The patient's history has been reviewed, patient examined, no change in status, stable for surgery.  I have reviewed the patient's chart and labs.  Questions were answered to the patient's satisfaction.     Va Broadwell A

## 2011-12-21 NOTE — H&P (Signed)
  [name] DOB: 02-Mar-1946  Chief Complaint: right knee pain  History of Present Illness The patient is a 66 year old male who is scheduled for a right knee arthroscopy with medial menisectomy by Dr. Darrelyn Hillock on Tuesday December 21, 2011. He presented with knee complaints. The patient reported right knee symptoms including: pain which began 2 months ago without any known injury.  Symptoms wee reported to be located in the right knee and include knee pain, swelling and audible pop at the time of onset. MRI revealed torn medial meniscus.   Allergies AMBIEN.  PERCOCET  Past Medical History Gastroesophageal Reflux Disease Lung Cancer  Family History Cancer. father, sister, grandmother mothers side and grandfather mothers side Congestive Heart Failure. mother Hypertension. mother   Social History Alcohol use. current drinker; drinks beer; 8-14 per week Children. 3 Current work status. retired Exercise. Exercises never Illicit drug use. no Living situation. live with spouse Marital status. married Tobacco / smoke exposure. no Tobacco use. former smoker; smoke(d) 1 1/2 pack(s) per day; uses less than half 1/2 can(s) smokeless per week   Medication History PredniSONE (5MG  Tablet, Oral) Active. Omeprazole (40MG  Capsule DR, Oral) Active. Doxazosin Mesylate (2MG  Tablet, Oral) Active. AmLODIPine Besylate (2.5MG  Tablet, Oral) Active. Atenolol-Chlorthalidone (50-25MG  Tablet, Oral) Active. Folic Acid (1MG  Tablet, Oral) Active. Folivane-Plus ( Oral) Active. Allegra (180MG  Tablet, Oral) Active. Ibuprofen (200MG  Capsule, Oral) Active. Senokot ( Oral) Specific dose unknown - Active. B Complex ( Oral) Active. Multiple Vitamin (1 Oral) Active. chemotherapy Active.   Past Surgical History Arthroscopy of Shoulder. left Colon Polyp Removal - Colonoscopy Rotator Cuff Repair. left Tonsillectomy Vasectomy   Review of Systems General:Not  Present- Chills, Fever, Night Sweats, Appetite Loss, Fatigue, Feeling sick, Weight Gain and Weight Loss. Skin:Not Present- Itching, Rash, Skin Color Changes, Ulcer, Psoriasis and Change in Hair or Nails. HEENT:Present- Sensitivity to light and Hearing problems. Not Present- Nose Bleed and Ringing in the Ears. Neck:Not Present- Swollen Glands and Neck Mass. Respiratory:Present- Dyspnea. Not Present- Snoring, Chronic Cough and Bloody sputum. Cardiovascular:Present- Shortness of Breath. Not Present- Chest Pain, Swelling of Extremities, Leg Cramps and Palpitations. Gastrointestinal:Present- Heartburn. Not Present- Bloody Stool, Abdominal Pain, Vomiting, Nausea and Incontinence of Stool. Male Genitourinary:Present- Frequency and Nocturia. Not Present- Blood in Urine and Incontinence. Musculoskeletal:Present- Joint Pain. Not Present- Muscle Weakness, Muscle Pain, Joint Stiffness, Joint Swelling and Back Pain. Neurological:Not Present- Tingling, Numbness, Burning, Tremor, Headaches and Dizziness. Psychiatric:Not Present- Anxiety, Depression and Memory Loss. Endocrine:Not Present- Cold Intolerance, Heat Intolerance, Excessive hunger and Excessive Thirst. Hematology:Present- Easy Bruising. Not Present- Abnormal Bleeding, Anemia and Blood Clots.   Vitals Weight: 138 lb Height: 67.5 in Body Surface Area: 1.73 m Body Mass Index: 21.29 kg/m Pulse: 64 (Regular) BP: 128/85 (Sitting, Left Arm, Standard)    Physical Exam Examination of the knee today looks ok.  He has good motion, mild genu varus, no effusion. Cruciate and collateral ligaments are intact. Popliteal space is normal. Calf is fine, no phlebitis. Circulation is intact.  Lungs: On auscultating his lungs, the lungs are clear. Heart: Normal sinus rhythm and no murmur. Neck: Supple, no bruits Oral Cavity: Free of any lesions.   Assessment & Plan Acute Medial Meniscal Tear (836.0) Right knee arthroscopy with  medial meniscus tear     Dimitri Ped, PA-C

## 2011-12-21 NOTE — Anesthesia Preprocedure Evaluation (Signed)
Anesthesia Evaluation  Patient identified by MRN, date of birth, ID band Patient awake    Reviewed: Allergy & Precautions, H&P , NPO status , Patient's Chart, lab work & pertinent test results  Airway Mallampati: II TM Distance: <3 FB Neck ROM: Full    Dental No notable dental hx.    Pulmonary former smoker,  H/O lung Ca 2010 breath sounds clear to auscultation  Pulmonary exam normal       Cardiovascular hypertension, Pt. on medications Rhythm:Regular Rate:Normal     Neuro/Psych negative neurological ROS  negative psych ROS   GI/Hepatic Neg liver ROS, GERD-  Medicated,  Endo/Other  negative endocrine ROS  Renal/GU negative Renal ROS  negative genitourinary   Musculoskeletal negative musculoskeletal ROS (+)   Abdominal   Peds negative pediatric ROS (+)  Hematology negative hematology ROS (+)   Anesthesia Other Findings   Reproductive/Obstetrics negative OB ROS                           Anesthesia Physical Anesthesia Plan  ASA: III  Anesthesia Plan: General   Post-op Pain Management:    Induction: Intravenous  Airway Management Planned: LMA  Additional Equipment:   Intra-op Plan:   Post-operative Plan:   Informed Consent: I have reviewed the patients History and Physical, chart, labs and discussed the procedure including the risks, benefits and alternatives for the proposed anesthesia with the patient or authorized representative who has indicated his/her understanding and acceptance.   Dental advisory given  Plan Discussed with: CRNA and Surgeon  Anesthesia Plan Comments:         Anesthesia Quick Evaluation

## 2011-12-21 NOTE — Anesthesia Procedure Notes (Signed)
Procedure Name: LMA Insertion Date/Time: 12/21/2011 1:42 PM Performed by: Fran Lowes Pre-anesthesia Checklist: Patient identified, Emergency Drugs available, Suction available and Patient being monitored Patient Re-evaluated:Patient Re-evaluated prior to inductionOxygen Delivery Method: Circle System Utilized Preoxygenation: Pre-oxygenation with 100% oxygen Intubation Type: IV induction Ventilation: Mask ventilation without difficulty LMA: LMA inserted LMA Size: 4.0 Number of attempts: 1 Airway Equipment and Method: bite block Placement Confirmation: positive ETCO2 Tube secured with: Tape Dental Injury: Teeth and Oropharynx as per pre-operative assessment

## 2011-12-22 ENCOUNTER — Encounter (HOSPITAL_BASED_OUTPATIENT_CLINIC_OR_DEPARTMENT_OTHER): Payer: Self-pay | Admitting: Orthopedic Surgery

## 2011-12-22 ENCOUNTER — Telehealth: Payer: Self-pay | Admitting: *Deleted

## 2011-12-22 NOTE — Op Note (Signed)
NAMEBERLIN, MOKRY             ACCOUNT NO.:  [id number]  MEDICAL RECORD NO.:  [id number]  LOCATION:                                 FACILITY:  PHYSICIAN:  Georges Lynch. Trevion Hoben, M.D.DATE OF BIRTH:  [birth year]  DATE OF PROCEDURE:  12/21/2011 DATE OF DISCHARGE:                              OPERATIVE REPORT   PREOPERATIVE DIAGNOSES: 1. Degenerative arthritis, right knee. 2. Torn medial meniscus, right knee.  POSTOPERATIVE DIAGNOSES: 1. Degenerative arthritis, right knee. 2. Torn medial meniscus, right knee.  OPERATION: 1. Diagnostic arthroscopy, right knee. 2. Abrasion chondroplasty, medial femoral condyle, right knee. 3. Microfracture technique, medial femoral condyle, right knee. 4. Partial medial meniscectomy, right knee.  PROCEDURE:  Under general anesthesia, routine orthopedic prepping and draping of the right lower extremity was carried out.  The patient had 2 g of IV Ancef.  The appropriate time-out was carried out in the operating room prior to making incision.  I also marked the appropriate right leg in the holding area.  At this particular time, small punctate incision was made in the suprapatellar pouch.  Inflow cannula was inserted.  Knee was distended with saline.  Another small punctate incision was made in the anterolateral joint.  The arthroscope was entered from the lateral approach, and I did a complete diagnostic arthroscopy on up in the suprapatellar pouch.  The patellofemoral joint looked fine.  No synovectomy was necessary.  The lateral joint showed some early arthritis. Meniscus was intact.  Cruciates were intact.  I went over the medial joint, and he had a severe osteoarthritic changes of the distal femoral condyle.  I first did an abrasion chondroplasty followed by microfracture technique.  This was carried out to the bleeding bone.  Following that, I went back to the posterior horn of the medial meniscus.  I did a partial medial meniscectomy.  I  thoroughly irrigated out the knee.  There were no other abnormalities noted.  I closed all 3 punctate incisions with 3-0 nylon suture.  I injected 30 mL of 0.25% Marcaine with epinephrine in the knee joint and a sterile Neosporin bundle dressing was applied.  POSTOP ORDERS:  He will have aspirin 325 mg b.i.d. starting today and b.i.d. for 2 weeks, that will be as an anticoagulant.  He will be on Dilaudid 2 mg every 4 hours p.r.n. for pain.  The remaining instructions were written.          ______________________________ Georges Lynch. Darrelyn Hillock, M.D.     RAG/MEDQ  D:  12/21/2011  T:  12/22/2011  Job:  [id number]

## 2011-12-22 NOTE — Telephone Encounter (Signed)
Per staff message and POF I have scheduled appt.  JMW  

## 2011-12-22 NOTE — Anesthesia Postprocedure Evaluation (Signed)
Anesthesia Post Note  Patient: [name]  Procedure(s) Performed: Procedure(s) (LRB): ARTHROSCOPY KNEE (Right)  Anesthesia type: General  Patient location: PACU  Post pain: Pain level controlled  Post assessment: Post-op Vital signs reviewed  Last Vitals:  Filed Vitals:   12/21/11 1615  BP: 126/76  Pulse: 60  Temp: 36.2 C  Resp: 20    Post vital signs: Reviewed  Level of consciousness: sedated  Complications: No apparent anesthesia complications

## 2011-12-23 ENCOUNTER — Telehealth: Payer: Self-pay | Admitting: Medical Oncology

## 2011-12-23 NOTE — Telephone Encounter (Signed)
Faxed medical clearance form signed by Dr Arbutus Ped, sent form to be scanned.

## 2011-12-23 NOTE — Telephone Encounter (Signed)
FORM FAXED BACK TO [facility]

## 2011-12-27 ENCOUNTER — Encounter (HOSPITAL_BASED_OUTPATIENT_CLINIC_OR_DEPARTMENT_OTHER): Payer: Self-pay

## 2012-01-12 ENCOUNTER — Ambulatory Visit: Payer: Medicare Other | Admitting: Physician Assistant

## 2012-01-12 ENCOUNTER — Ambulatory Visit: Payer: Medicare Other

## 2012-01-12 ENCOUNTER — Other Ambulatory Visit: Payer: Medicare Other | Admitting: Lab

## 2012-01-12 ENCOUNTER — Ambulatory Visit: Payer: Medicare Other | Admitting: Internal Medicine

## 2012-01-13 ENCOUNTER — Other Ambulatory Visit: Payer: Medicare Other | Admitting: Lab

## 2012-01-13 ENCOUNTER — Ambulatory Visit: Payer: Medicare Other

## 2012-01-18 ENCOUNTER — Ambulatory Visit (HOSPITAL_BASED_OUTPATIENT_CLINIC_OR_DEPARTMENT_OTHER): Payer: Medicare Other | Admitting: Internal Medicine

## 2012-01-18 ENCOUNTER — Telehealth: Payer: Self-pay | Admitting: *Deleted

## 2012-01-18 ENCOUNTER — Ambulatory Visit (HOSPITAL_BASED_OUTPATIENT_CLINIC_OR_DEPARTMENT_OTHER): Payer: Medicare Other

## 2012-01-18 ENCOUNTER — Telehealth: Payer: Self-pay | Admitting: Internal Medicine

## 2012-01-18 ENCOUNTER — Other Ambulatory Visit (HOSPITAL_BASED_OUTPATIENT_CLINIC_OR_DEPARTMENT_OTHER): Payer: Medicare Other | Admitting: Lab

## 2012-01-18 VITALS — BP 131/70 | HR 55 | Resp 16

## 2012-01-18 VITALS — BP 135/87 | HR 54 | Temp 97.4°F | Resp 20 | Ht 67.0 in | Wt 143.0 lb

## 2012-01-18 DIAGNOSIS — Z5111 Encounter for antineoplastic chemotherapy: Secondary | ICD-10-CM

## 2012-01-18 DIAGNOSIS — C3410 Malignant neoplasm of upper lobe, unspecified bronchus or lung: Secondary | ICD-10-CM

## 2012-01-18 DIAGNOSIS — C3490 Malignant neoplasm of unspecified part of unspecified bronchus or lung: Secondary | ICD-10-CM

## 2012-01-18 DIAGNOSIS — Z5112 Encounter for antineoplastic immunotherapy: Secondary | ICD-10-CM

## 2012-01-18 LAB — COMPREHENSIVE METABOLIC PANEL (CC13)
ALT: 23 U/L (ref 0–55)
CO2: 23 mEq/L (ref 22–29)
Calcium: 9.9 mg/dL (ref 8.4–10.4)
Chloride: 105 mEq/L (ref 98–107)
Sodium: 139 mEq/L (ref 136–145)
Total Bilirubin: 0.7 mg/dL (ref 0.20–1.20)
Total Protein: 6.3 g/dL — ABNORMAL LOW (ref 6.4–8.3)

## 2012-01-18 LAB — CBC WITH DIFFERENTIAL/PLATELET
Basophils Absolute: 0.1 10*3/uL (ref 0.0–0.1)
Eosinophils Absolute: 0.2 10*3/uL (ref 0.0–0.5)
HGB: 13 g/dL (ref 13.0–17.1)
NEUT#: 3.6 10*3/uL (ref 1.5–6.5)
RDW: 14.5 % (ref 11.0–14.6)
lymph#: 2 10*3/uL (ref 0.9–3.3)

## 2012-01-18 LAB — UA PROTEIN, DIPSTICK - CHCC: Protein, ur: <30 mg/dL

## 2012-01-18 MED ORDER — HEPARIN SOD (PORK) LOCK FLUSH 100 UNIT/ML IV SOLN
500.0000 [IU] | Freq: Once | INTRAVENOUS | Status: AC | PRN
  Administered 2012-01-18: 500 [IU]
  Filled 2012-01-18: qty 5

## 2012-01-18 MED ORDER — SODIUM CHLORIDE 0.9 % IV SOLN
15.0000 mg/kg | Freq: Once | INTRAVENOUS | Status: AC
  Administered 2012-01-18: 975 mg via INTRAVENOUS
  Filled 2012-01-18: qty 39

## 2012-01-18 MED ORDER — ONDANSETRON 8 MG/50ML IVPB (CHCC)
8.0000 mg | Freq: Once | INTRAVENOUS | Status: AC
  Administered 2012-01-18: 8 mg via INTRAVENOUS

## 2012-01-18 MED ORDER — SODIUM CHLORIDE 0.9 % IV SOLN
500.0000 mg/m2 | Freq: Once | INTRAVENOUS | Status: AC
  Administered 2012-01-18: 875 mg via INTRAVENOUS
  Filled 2012-01-18: qty 35

## 2012-01-18 MED ORDER — SODIUM CHLORIDE 0.9 % IJ SOLN
10.0000 mL | INTRAMUSCULAR | Status: DC | PRN
  Administered 2012-01-18: 10 mL
  Filled 2012-01-18: qty 10

## 2012-01-18 MED ORDER — SODIUM CHLORIDE 0.9 % IV SOLN
Freq: Once | INTRAVENOUS | Status: AC
  Administered 2012-01-18: 11:00:00 via INTRAVENOUS

## 2012-01-18 MED ORDER — DEXAMETHASONE SODIUM PHOSPHATE 10 MG/ML IJ SOLN
10.0000 mg | Freq: Once | INTRAMUSCULAR | Status: AC
  Administered 2012-01-18: 10 mg via INTRAVENOUS

## 2012-01-18 NOTE — Progress Notes (Signed)
[facility] [phone]   [phone]  OFFICE PROGRESS NOTE  [name], MD [address] [place] [address]  DIAGNOSIS: Metastatic non-small cell lung cancer adenocarcinoma diagnosed in September 2010   PRIOR THERAPY:  #1 status post 6 cycles of systemic chemotherapy with carboplatin, Alimta and Avastin given every 3 weeks last dose given 04/23/2009 with disease stabilization.  #2 status post palliative radiotherapy to the mediastinum under the care of Dr. Michell Heinrich. The patient received a total dose of 3000 cGY and 12 fractions completed 01/21/2009.  #3 Maintenance chemotherapy with Alimta at 500 mg per meter squared and Avastin at 15 mg per kilogram given every 3 weeks status post 35 cycles.   CURRENT THERAPY: Maintenance chemotherapy with Alimta 500 mg/M2 and Avastin 15 mg/kg every 4 weeks,status post 7 cycles.   INTERVAL HISTORY: [name] 66 y.o. male returns to the clinic today for followup visit accompanied by his wife. The patient recently underwent 3 surgeries including 2 cataract surgery in addition to right knee arthroscopy with medial menisectomy. His knee surgery was on 12/21/2011. The patient has been off his treatment since 12/07/2011. He is here today to resume her systemic chemotherapy with maintenance Alimta and Avastin. He is feeling fine with no specific complaints today. He denied having any significant chest pain, shortness breath, cough or hemoptysis. He denied having any bleeding issues. He denied having any significant weight loss or night sweats.  MEDICAL HISTORY: Past Medical History  Diagnosis Date  . Hypertension   . Arthritis RIGHT SHOULDER  . Immature cataract BILATERAL  . Non-small cell lung cancer DX SEPT 2010  W/ CHEMORADIATION AT THAT TIME -- NOW  W/ METS--  CURRENTLY ON MAINTENANCE CHEMO TX  EVERY 30 DAYS    ONCOLOGIST- DR [facility]  . Acute meniscal tear of knee RIGHT KNEE  . BPH (benign  prostatic hypertrophy)     ALLERGIES:  is allergic to percocet and zolpidem tartrate.  MEDICATIONS:  Current Outpatient Prescriptions  Medication Sig Dispense Refill  . amLODipine (NORVASC) 2.5 MG tablet Take 2.5 mg by mouth every morning.       Marland Kitchen atenolol-chlorthalidone (TENORETIC) 50-25 MG per tablet Take 1 tablet by mouth Daily.      . B Complex-Biotin-FA (B-COMPLEX PO) Take 1 tablet by mouth daily. 1500mg  daily      . dexamethasone (DECADRON) 4 MG tablet Take 4 mg by mouth as directed. 1 tablet day before, day of and day after chemo      . doxazosin (CARDURA) 2 MG tablet Take 2 mg by mouth at bedtime.       . DUREZOL 0.05 % EMUL Use as directed      . FeFum-FePoly-FA-B Cmp-C-Biot (FOLIVANE-PLUS) CAPS Take 1 capsule by mouth daily.  90 capsule  3  . fexofenadine (ALLEGRA) 180 MG tablet Take 180 mg by mouth daily as needed.       . folic acid (FOLVITE) 1 MG tablet Take 1 tablet (1 mg total) by mouth daily.  90 tablet  3  . HYDROcodone-homatropine (HYCODAN) 5-1.5 MG/5ML syrup Take 5 mLs by mouth every 6 (six) hours as needed.  120 mL  0  . ibuprofen (ADVIL,MOTRIN) 200 MG tablet Take 200 mg by mouth every 6 (six) hours as needed.       . Multiple Vitamin (MULTIVITAMIN) tablet Take 1 tablet by mouth daily.       Marland Kitchen ofloxacin (OCUFLOX) 0.3 % ophthalmic solution As directed      .  omeprazole (PRILOSEC) 40 MG capsule Take 40 mg by mouth every evening.      . prochlorperazine (COMPAZINE) 10 MG tablet Take 10 mg by mouth every 6 (six) hours as needed.       Marland Kitchen PROLENSA 0.07 % SOLN Use as directed      . Sennosides-Docusate Sodium (SENOKOT S PO) Take 2 tablets by mouth daily. 2 tabs daily        SURGICAL HISTORY:  Past Surgical History  Procedure Date  . 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 Rotator cuff repair 2008    LEFT SHOULDER  . Bilateral ear drum surgery 1960'S  . Orif left ring finger  and revascularization of  radial side 02-16-2007    DEGLOVING INJURY  . Knee arthroscopy 12/21/2011    Procedure: ARTHROSCOPY KNEE;  Surgeon: Jacki Cones, MD;  Location: [facility];  Service: Orthopedics;  Laterality: Right;  WITH MEDIAL MENISECTOMY    REVIEW OF SYSTEMS:  A comprehensive review of systems was negative.   PHYSICAL EXAMINATION: General appearance: alert, cooperative and no distress Head: Normocephalic, without obvious abnormality, atraumatic Lymph nodes: Cervical, supraclavicular, and axillary nodes normal. Resp: clear to auscultation bilaterally Cardio: regular rate and rhythm, S1, S2 normal, no murmur, click, rub or gallop GI: soft, non-tender; bowel sounds normal; no masses,  no organomegaly Extremities: extremities normal, atraumatic, no cyanosis or edema Neurologic: Alert and oriented X 3, normal strength and tone. Normal symmetric reflexes. Normal coordination and gait  ECOG PERFORMANCE STATUS: 1 - Symptomatic but completely ambulatory  Blood pressure 135/87, pulse 54, temperature 97.4 F (36.3 C), temperature source Oral, resp. rate 20, height 5\' 7"  (1.702 m), weight 143 lb (64.864 kg).  LABORATORY DATA: Lab Results  Component Value Date   WBC 6.7 01/18/2012   HGB 13.0 01/18/2012   HCT 37.7* 01/18/2012   MCV 101.3* 01/18/2012   PLT 149 01/18/2012      Chemistry      Component Value Date/Time   NA 140 12/21/2011 1252   NA 133* 12/07/2011 1133   K 3.3* 12/21/2011 1252   K 4.1 12/07/2011 1133   CL 100 12/07/2011 1133   CL 101 11/18/2011 0858   CO2 22 12/07/2011 1133   CO2 22 11/18/2011 0858   BUN 23.0 12/07/2011 1133   BUN 19 11/18/2011 0858   CREATININE 1.3 12/07/2011 1133   CREATININE 1.34 11/18/2011 0858      Component Value Date/Time   CALCIUM 9.5 12/07/2011 1133   CALCIUM 9.5 11/18/2011 0858   ALKPHOS 63 12/07/2011 1133   ALKPHOS 55 11/18/2011 0858   AST 34 12/07/2011 1133   AST 25 11/18/2011 0858   ALT 26 12/07/2011 1133   ALT 17 11/18/2011 0858   BILITOT 0.70  12/07/2011 1133   BILITOT 0.5 11/18/2011 0858       RADIOGRAPHIC STUDIES: No results found.  ASSESSMENT: This is a very pleasant 66 years old white male with metastatic non-small cell lung cancer, adenocarcinoma currently on maintenance chemotherapy with Alimta and Avastin that has been on hold for the last 6 weeks to give the patient time to have his cataract and knee surgery done. He is feeling fine today  PLAN: We will resume his systemic chemotherapy with Alimta and Avastin today as scheduled. He would come back for followup visit in 4 weeks with the start of the next cycle of his treatment. He was advised to call  me immediately she has any concerning symptoms in the interval.  All questions were answered. The patient knows to call the clinic with any problems, questions or concerns. We can certainly see the patient much sooner if necessary.  I spent 15 minutes counseling the patient face to face. The total time spent in the appointment was 25 minutes.

## 2012-01-18 NOTE — Telephone Encounter (Signed)
Gave pt apt for November and December 2013 lab ,chemo and MD

## 2012-01-18 NOTE — Patient Instructions (Signed)
We will resume your chemotherapy with Alimta and Avastin today. Followup in 4 weeks

## 2012-01-18 NOTE — Telephone Encounter (Signed)
Per staff message and POF I have scheduled appts.  JMW  

## 2012-01-18 NOTE — Patient Instructions (Signed)
[facility] Discharge Instructions for Patients Receiving Chemotherapy  Today you received the following chemotherapy agents Avastin and Alimta.  To help prevent nausea and vomiting after your treatment, we encourage you to take your nausea medication.   If you develop nausea and vomiting that is not controlled by your nausea medication, call the clinic. If it is after clinic hours your family physician or the after hours number for the clinic or go to the Emergency Department.   BELOW ARE SYMPTOMS THAT SHOULD BE REPORTED IMMEDIATELY:  *FEVER GREATER THAN 100.5 F  *CHILLS WITH OR WITHOUT FEVER  NAUSEA AND VOMITING THAT IS NOT CONTROLLED WITH YOUR NAUSEA MEDICATION  *UNUSUAL SHORTNESS OF BREATH  *UNUSUAL BRUISING OR BLEEDING  TENDERNESS IN MOUTH AND THROAT WITH OR WITHOUT PRESENCE OF ULCERS  *URINARY PROBLEMS  *BOWEL PROBLEMS  UNUSUAL RASH Items with * indicate a potential emergency and should be followed up as soon as possible.  One of the nurses will contact you 24 hours after your treatment. Please let the nurse know about any problems that you may have experienced. Feel free to call the clinic you have any questions or concerns. The clinic phone number is [phone].   I have been informed and understand all the instructions given to me. I know to contact the clinic, my physician, or go to the Emergency Department if any problems should occur. I do not have any questions at this time, but understand that I may call the clinic during office hours   should I have any questions or need assistance in obtaining follow up care.    __________________________________________  _____________  __________ Signature of Patient or Authorized Representative            Date                   Time    __________________________________________ Nurse's Signature

## 2012-01-25 ENCOUNTER — Telehealth: Payer: Self-pay | Admitting: Internal Medicine

## 2012-01-25 NOTE — Telephone Encounter (Signed)
Talked to patient, reminded him of appt for November 2013 lab, ML and chemo and advised patient to get calendar

## 2012-02-09 ENCOUNTER — Other Ambulatory Visit: Payer: Medicare Other | Admitting: Lab

## 2012-02-09 ENCOUNTER — Ambulatory Visit: Payer: Medicare Other

## 2012-02-10 ENCOUNTER — Ambulatory Visit: Payer: Medicare Other

## 2012-02-10 ENCOUNTER — Other Ambulatory Visit: Payer: Medicare Other | Admitting: Lab

## 2012-02-15 ENCOUNTER — Telehealth: Payer: Self-pay | Admitting: Internal Medicine

## 2012-02-15 ENCOUNTER — Ambulatory Visit (HOSPITAL_BASED_OUTPATIENT_CLINIC_OR_DEPARTMENT_OTHER): Payer: Medicare Other

## 2012-02-15 ENCOUNTER — Other Ambulatory Visit (HOSPITAL_BASED_OUTPATIENT_CLINIC_OR_DEPARTMENT_OTHER): Payer: Medicare Other

## 2012-02-15 ENCOUNTER — Ambulatory Visit (HOSPITAL_BASED_OUTPATIENT_CLINIC_OR_DEPARTMENT_OTHER): Payer: Medicare Other | Admitting: Physician Assistant

## 2012-02-15 VITALS — BP 142/82 | HR 56

## 2012-02-15 VITALS — BP 134/81 | HR 58 | Temp 97.3°F | Resp 20 | Ht 67.0 in | Wt 144.8 lb

## 2012-02-15 DIAGNOSIS — C3410 Malignant neoplasm of upper lobe, unspecified bronchus or lung: Secondary | ICD-10-CM

## 2012-02-15 DIAGNOSIS — C3490 Malignant neoplasm of unspecified part of unspecified bronchus or lung: Secondary | ICD-10-CM

## 2012-02-15 DIAGNOSIS — Z5112 Encounter for antineoplastic immunotherapy: Secondary | ICD-10-CM

## 2012-02-15 DIAGNOSIS — Z5111 Encounter for antineoplastic chemotherapy: Secondary | ICD-10-CM

## 2012-02-15 LAB — CBC WITH DIFFERENTIAL/PLATELET
Basophils Absolute: 0 10*3/uL (ref 0.0–0.1)
Eosinophils Absolute: 0.1 10*3/uL (ref 0.0–0.5)
HCT: 38.5 % (ref 38.4–49.9)
HGB: 13.2 g/dL (ref 13.0–17.1)
NEUT#: 3.1 10*3/uL (ref 1.5–6.5)
NEUT%: 57.6 % (ref 39.0–75.0)
RDW: 15 % — ABNORMAL HIGH (ref 11.0–14.6)
lymph#: 1.5 10*3/uL (ref 0.9–3.3)

## 2012-02-15 LAB — COMPREHENSIVE METABOLIC PANEL (CC13)
ALT: 22 U/L (ref 0–55)
AST: 30 U/L (ref 5–34)
Alkaline Phosphatase: 82 U/L (ref 40–150)
BUN: 26 mg/dL (ref 7.0–26.0)
Calcium: 9.9 mg/dL (ref 8.4–10.4)
Creatinine: 1.2 mg/dL (ref 0.7–1.3)
Total Bilirubin: 0.82 mg/dL (ref 0.20–1.20)

## 2012-02-15 LAB — UA PROTEIN, DIPSTICK - CHCC: Protein, ur: <30 mg/dL

## 2012-02-15 MED ORDER — SODIUM CHLORIDE 0.9 % IV SOLN
15.0000 mg/kg | Freq: Once | INTRAVENOUS | Status: AC
  Administered 2012-02-15: 975 mg via INTRAVENOUS
  Filled 2012-02-15: qty 39

## 2012-02-15 MED ORDER — SODIUM CHLORIDE 0.9 % IV SOLN
Freq: Once | INTRAVENOUS | Status: AC
  Administered 2012-02-15: 10:00:00 via INTRAVENOUS

## 2012-02-15 MED ORDER — ONDANSETRON 8 MG/50ML IVPB (CHCC)
8.0000 mg | Freq: Once | INTRAVENOUS | Status: AC
  Administered 2012-02-15: 8 mg via INTRAVENOUS

## 2012-02-15 MED ORDER — SODIUM CHLORIDE 0.9 % IV SOLN
500.0000 mg/m2 | Freq: Once | INTRAVENOUS | Status: AC
  Administered 2012-02-15: 875 mg via INTRAVENOUS
  Filled 2012-02-15: qty 35

## 2012-02-15 MED ORDER — CYANOCOBALAMIN 1000 MCG/ML IJ SOLN
1000.0000 ug | Freq: Once | INTRAMUSCULAR | Status: AC
  Administered 2012-02-15: 1000 ug via INTRAMUSCULAR

## 2012-02-15 MED ORDER — DEXAMETHASONE SODIUM PHOSPHATE 10 MG/ML IJ SOLN
10.0000 mg | Freq: Once | INTRAMUSCULAR | Status: AC
  Administered 2012-02-15: 10 mg via INTRAVENOUS

## 2012-02-15 NOTE — Telephone Encounter (Signed)
appts made and pritned for pt,pt aware that cen. Sch. Will call with scan appt       aom

## 2012-02-15 NOTE — Patient Instructions (Addendum)
[facility] Discharge Instructions for Patients Receiving Chemotherapy  Today you received the following chemotherapy agents Alimta and Avastin.  To help prevent nausea and vomiting after your treatment, we encourage you to take your nausea medication as prescribed.   If you develop nausea and vomiting that is not controlled by your nausea medication, call the clinic. If it is after clinic hours your family physician or the after hours number for the clinic or go to the Emergency Department.   BELOW ARE SYMPTOMS THAT SHOULD BE REPORTED IMMEDIATELY:  *FEVER GREATER THAN 100.5 F  *CHILLS WITH OR WITHOUT FEVER  NAUSEA AND VOMITING THAT IS NOT CONTROLLED WITH YOUR NAUSEA MEDICATION  *UNUSUAL SHORTNESS OF BREATH  *UNUSUAL BRUISING OR BLEEDING  TENDERNESS IN MOUTH AND THROAT WITH OR WITHOUT PRESENCE OF ULCERS  *URINARY PROBLEMS  *BOWEL PROBLEMS  UNUSUAL RASH Items with * indicate a potential emergency and should be followed up as soon as possible.  One of the nurses will contact you 24 hours after your treatment. Please let the nurse know about any problems that you may have experienced. Feel free to call the clinic you have any questions or concerns. The clinic phone number is [phone].   I have been informed and understand all the instructions given to me. I know to contact the clinic, my physician, or go to the Emergency Department if any problems should occur. I do not have any questions at this time, but understand that I may call the clinic during office hours   should I have any questions or need assistance in obtaining follow up care.    __________________________________________  _____________  __________ Signature of Patient or Authorized Representative            Date                   Time    __________________________________________ Nurse's Signature

## 2012-02-15 NOTE — Patient Instructions (Addendum)
You may discontinue taking your Integra Plus Follow up with Dr. Arbutus Ped in 1 month with a restaging CT scan of your chest, abdomen and pelvis to re-evaluate your disease

## 2012-02-16 ENCOUNTER — Other Ambulatory Visit: Payer: Self-pay | Admitting: Certified Registered Nurse Anesthetist

## 2012-02-21 NOTE — Progress Notes (Signed)
[facility] Telephone:([phone]) [phone]   Fax:([phone]  OFFICE PROGRESS NOTE  [name], MD [address] [place] [address]  DIAGNOSIS: Metastatic non-small cell lung cancer adenocarcinoma diagnosed in September 2010   PRIOR THERAPY:  #1 status post 6 cycles of systemic chemotherapy with carboplatin, Alimta and Avastin given every 3 weeks last dose given 04/23/2009 with disease stabilization.  #2 status post palliative radiotherapy to the mediastinum under the care of Dr. Michell Heinrich. The patient received a total dose of 3000 cGY and 12 fractions completed 01/21/2009.  #3 Maintenance chemotherapy with Alimta at 500 mg per meter squared and Avastin at 15 mg per kilogram given every 3 weeks status post 35 cycles.   CURRENT THERAPY: Maintenance chemotherapy with Alimta 500 mg/M2 and Avastin 15 mg/kg every 4 weeks,status post 8 cycles.   INTERVAL HISTORY: [name] 66 y.o. male returns to the clinic today for followup visit.  The patient recently underwent 3 surgeries including 2 cataract surgery in addition to right knee arthroscopy with medial menisectomy. His knee surgery was on 12/21/2011. The patient has been off his treatment since 12/07/2011. He resumed treatment 01/18/2012. Since having a surgeries he states that his knee is somewhat stiff but is overall better. Reports that his vision is significantly better since his cataract surgery. He is wondering if he should continue taking the Integra plus as the cost of this medication has gone up significantly. Overall he is tolerating his maintenance chemotherapy with Alimta and Avastin without difficulty. He denies any bleeding or bruising. He is feeling fine with no specific complaints today. He denied having any significant chest pain, shortness breath, cough or hemoptysis.  He denied having any significant weight loss or night sweats.  MEDICAL HISTORY: Past Medical History  Diagnosis Date  .  Hypertension   . Arthritis RIGHT SHOULDER  . Immature cataract BILATERAL  . Non-small cell lung cancer DX SEPT 2010  W/ CHEMORADIATION AT THAT TIME -- NOW  W/ METS--  CURRENTLY ON MAINTENANCE CHEMO TX  EVERY 30 DAYS    ONCOLOGIST- DR [facility]  . Acute meniscal tear of knee RIGHT KNEE  . BPH (benign prostatic hypertrophy)     ALLERGIES:  is allergic to percocet and zolpidem tartrate.  MEDICATIONS:  Current Outpatient Prescriptions  Medication Sig Dispense Refill  . amLODipine (NORVASC) 2.5 MG tablet Take 2.5 mg by mouth every morning.       Marland Kitchen atenolol-chlorthalidone (TENORETIC) 50-25 MG per tablet Take 1 tablet by mouth Daily.      . B Complex-Biotin-FA (B-COMPLEX PO) Take 1 tablet by mouth daily. 1500mg  daily      . dexamethasone (DECADRON) 4 MG tablet Take 4 mg by mouth 2 (two) times daily with a meal. 1 tablet twice a day, the day before, day of and day after chemo      . doxazosin (CARDURA) 2 MG tablet Take 2 mg by mouth at bedtime.       . FeFum-FePoly-FA-B Cmp-C-Biot (FOLIVANE-PLUS) CAPS Take 1 capsule by mouth daily.  90 capsule  3  . fexofenadine (ALLEGRA) 180 MG tablet Take 180 mg by mouth daily as needed.       Marland Kitchen HYDROcodone-homatropine (HYCODAN) 5-1.5 MG/5ML syrup Take 5 mLs by mouth every 6 (six) hours as needed.  120 mL  0  . ibuprofen (ADVIL,MOTRIN) 200 MG tablet Take 200 mg by mouth every 6 (six) hours as needed.       . Multiple Vitamin (  MULTIVITAMIN) tablet Take 1 tablet by mouth daily.       Marland Kitchen omeprazole (PRILOSEC) 40 MG capsule Take 40 mg by mouth every evening.      . prochlorperazine (COMPAZINE) 10 MG tablet Take 10 mg by mouth every 6 (six) hours as needed.       Bernadette Hoit Sodium (SENOKOT S PO) Take 2 tablets by mouth daily. 2 tabs daily        SURGICAL HISTORY:  Past Surgical History  Procedure Date  . 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 Rotator cuff repair 2008    LEFT SHOULDER   . Bilateral ear drum surgery 1960'S  . Orif left ring finger  and revascularization of radial side 02-16-2007    DEGLOVING INJURY  . Knee arthroscopy 12/21/2011    Procedure: ARTHROSCOPY KNEE;  Surgeon: Jacki Cones, MD;  Location: [facility];  Service: Orthopedics;  Laterality: Right;  WITH MEDIAL MENISECTOMY    REVIEW OF SYSTEMS:  A comprehensive review of systems was negative.   PHYSICAL EXAMINATION: General appearance: alert, cooperative and no distress Head: Normocephalic, without obvious abnormality, atraumatic Lymph nodes: Cervical, supraclavicular, and axillary nodes normal. Resp: clear to auscultation bilaterally Cardio: regular rate and rhythm, S1, S2 normal, no murmur, click, rub or gallop GI: soft, non-tender; bowel sounds normal; no masses,  no organomegaly Extremities: extremities normal, atraumatic, no cyanosis or edema Neurologic: Alert and oriented X 3, normal strength and tone. Normal symmetric reflexes. Normal coordination and gait  ECOG PERFORMANCE STATUS: 1 - Symptomatic but completely ambulatory  Blood pressure 134/81, pulse 58, temperature 97.3 F (36.3 C), temperature source Oral, resp. rate 20, height 5\' 7"  (1.702 m), weight 144 lb 12.8 oz (65.681 kg).  LABORATORY DATA: Lab Results  Component Value Date   WBC 5.4 02/15/2012   HGB 13.2 02/15/2012   HCT 38.5 02/15/2012   MCV 100.5* 02/15/2012   PLT 164 02/15/2012      Chemistry      Component Value Date/Time   NA 135* 02/15/2012 0858   NA 140 12/21/2011 1252   K 4.1 02/15/2012 0858   K 3.3* 12/21/2011 1252   CL 102 02/15/2012 0858   CL 101 11/18/2011 0858   CO2 25 02/15/2012 0858   CO2 22 11/18/2011 0858   BUN 26.0 02/15/2012 0858   BUN 19 11/18/2011 0858   CREATININE 1.2 02/15/2012 0858   CREATININE 1.34 11/18/2011 0858      Component Value Date/Time   CALCIUM 9.9 02/15/2012 0858   CALCIUM 9.5 11/18/2011 0858   ALKPHOS 82 02/15/2012 0858   ALKPHOS 55 11/18/2011 0858   AST 30  02/15/2012 0858   AST 25 11/18/2011 0858   ALT 22 02/15/2012 0858   ALT 17 11/18/2011 0858   BILITOT 0.82 02/15/2012 0858   BILITOT 0.5 11/18/2011 0858       RADIOGRAPHIC STUDIES: No results found.  ASSESSMENT/PLAN: This is a very pleasant 66 years old white male with metastatic non-small cell lung cancer, adenocarcinoma currently on maintenance chemotherapy with Alimta and Avastin. Patient resumed his maintenance chemotherapy 01/18/2012, after it had been on hold so that he could have his cataract in the surgery. Patient was discussed with Dr. Arbutus Ped. He will proceed with his scheduled cycle of maintenance chemotherapy with Alimta and Avastin today. He'll followup with Dr. Arbutus Ped in one month with a repeat CBC differential, C. met and CT the chest abdomen and  pelvis with contrast to reevaluate his disease. After careful review the patient may discontinue the Integra plus at this time. We will continue to follow his hemoglobin and hematocrit closely and will reinstitute this medication at a later date if indicated.   Laural Benes, Shemeika Starzyk E, PA-C   All questions were answered. The patient knows to call the clinic with any problems, questions or concerns. We can certainly see the patient much sooner if necessary.  I spent 15 minutes counseling the patient face to face. The total time spent in the appointment was 25 minutes.

## 2012-03-02 ENCOUNTER — Telehealth: Payer: Self-pay | Admitting: *Deleted

## 2012-03-02 NOTE — Telephone Encounter (Signed)
Pt called stating he is seeing his PCP tomorrow and he may be getting an ingrown toenail removed from his left big toe.  He wants to make sure Dr Donnald Garre is okay with him having this done.  Per Dr Donnald Garre, okay for pt to have ingrown toenail removed if necessary.  Last avastin/alimta 11/19.  Informed pt, he verbalized understanding.  SLJ

## 2012-03-07 ENCOUNTER — Other Ambulatory Visit: Payer: Medicare Other | Admitting: Lab

## 2012-03-09 ENCOUNTER — Ambulatory Visit (HOSPITAL_COMMUNITY)
Admission: RE | Admit: 2012-03-09 | Discharge: 2012-03-09 | Disposition: A | Discharge status: Home / Self Care | Payer: Medicare Other | Source: Ambulatory Visit | Attending: Physician Assistant | Admitting: Physician Assistant

## 2012-03-09 DIAGNOSIS — Z79899 Other long term (current) drug therapy: Secondary | ICD-10-CM | POA: Insufficient documentation

## 2012-03-09 DIAGNOSIS — C3410 Malignant neoplasm of upper lobe, unspecified bronchus or lung: Secondary | ICD-10-CM | POA: Insufficient documentation

## 2012-03-09 DIAGNOSIS — Z923 Personal history of irradiation: Secondary | ICD-10-CM | POA: Insufficient documentation

## 2012-03-09 MED ORDER — IOHEXOL 300 MG/ML  SOLN
100.0000 mL | Freq: Once | INTRAMUSCULAR | Status: AC | PRN
  Administered 2012-03-09: 100 mL via INTRAVENOUS

## 2012-03-14 ENCOUNTER — Telehealth: Payer: Self-pay | Admitting: Internal Medicine

## 2012-03-14 ENCOUNTER — Ambulatory Visit (HOSPITAL_BASED_OUTPATIENT_CLINIC_OR_DEPARTMENT_OTHER): Payer: Medicare Other | Admitting: Internal Medicine

## 2012-03-14 ENCOUNTER — Telehealth: Payer: Self-pay | Admitting: *Deleted

## 2012-03-14 ENCOUNTER — Other Ambulatory Visit: Payer: Medicare Other | Admitting: Lab

## 2012-03-14 ENCOUNTER — Other Ambulatory Visit (HOSPITAL_BASED_OUTPATIENT_CLINIC_OR_DEPARTMENT_OTHER): Payer: Medicare Other

## 2012-03-14 ENCOUNTER — Ambulatory Visit (HOSPITAL_BASED_OUTPATIENT_CLINIC_OR_DEPARTMENT_OTHER): Payer: Medicare Other

## 2012-03-14 VITALS — BP 129/82 | HR 62 | Temp 96.5°F | Resp 20 | Ht 67.0 in | Wt 141.7 lb

## 2012-03-14 DIAGNOSIS — Z5111 Encounter for antineoplastic chemotherapy: Secondary | ICD-10-CM

## 2012-03-14 DIAGNOSIS — C3490 Malignant neoplasm of unspecified part of unspecified bronchus or lung: Secondary | ICD-10-CM

## 2012-03-14 DIAGNOSIS — C779 Secondary and unspecified malignant neoplasm of lymph node, unspecified: Secondary | ICD-10-CM

## 2012-03-14 DIAGNOSIS — Z5112 Encounter for antineoplastic immunotherapy: Secondary | ICD-10-CM

## 2012-03-14 DIAGNOSIS — C3410 Malignant neoplasm of upper lobe, unspecified bronchus or lung: Secondary | ICD-10-CM

## 2012-03-14 LAB — CBC WITH DIFFERENTIAL/PLATELET
Basophils Absolute: 0.1 10*3/uL (ref 0.0–0.1)
Eosinophils Absolute: 0.2 10*3/uL (ref 0.0–0.5)
HGB: 12.7 g/dL — ABNORMAL LOW (ref 13.0–17.1)
MONO#: 1 10*3/uL — ABNORMAL HIGH (ref 0.1–0.9)
NEUT#: 4.2 10*3/uL (ref 1.5–6.5)
RBC: 3.74 10*6/uL — ABNORMAL LOW (ref 4.20–5.82)
RDW: 15.6 % — ABNORMAL HIGH (ref 11.0–14.6)
WBC: 6.9 10*3/uL (ref 4.0–10.3)
lymph#: 1.5 10*3/uL (ref 0.9–3.3)
nRBC: 0 % (ref 0–0)

## 2012-03-14 LAB — COMPREHENSIVE METABOLIC PANEL (CC13)
AST: 27 U/L (ref 5–34)
Alkaline Phosphatase: 76 U/L (ref 40–150)
BUN: 26 mg/dL (ref 7.0–26.0)
CO2: 28 mEq/L (ref 22–29)
Calcium: 10 mg/dL (ref 8.4–10.4)
Creatinine: 1.4 mg/dL — ABNORMAL HIGH (ref 0.7–1.3)
Potassium: 3.9 mEq/L (ref 3.5–5.1)
Sodium: 140 mEq/L (ref 136–145)

## 2012-03-14 LAB — UA PROTEIN, DIPSTICK - CHCC: Protein, ur: <30 mg/dL

## 2012-03-14 MED ORDER — OMEPRAZOLE 40 MG PO CPDR: 40.0000 mg | DELAYED_RELEASE_CAPSULE | Freq: Every evening | ORAL | Status: DC

## 2012-03-14 MED ORDER — ONDANSETRON 8 MG/50ML IVPB (CHCC)
8.0000 mg | Freq: Once | INTRAVENOUS | Status: AC
  Administered 2012-03-14: 8 mg via INTRAVENOUS

## 2012-03-14 MED ORDER — SODIUM CHLORIDE 0.9 % IV SOLN
15.0000 mg/kg | Freq: Once | INTRAVENOUS | Status: AC
  Administered 2012-03-14: 975 mg via INTRAVENOUS
  Filled 2012-03-14: qty 39

## 2012-03-14 MED ORDER — SODIUM CHLORIDE 0.9 % IV SOLN
500.0000 mg/m2 | Freq: Once | INTRAVENOUS | Status: AC
  Administered 2012-03-14: 875 mg via INTRAVENOUS
  Filled 2012-03-14: qty 35

## 2012-03-14 MED ORDER — SODIUM CHLORIDE 0.9 % IJ SOLN
10.0000 mL | INTRAMUSCULAR | Status: DC | PRN
  Administered 2012-03-14: 10 mL
  Filled 2012-03-14: qty 10

## 2012-03-14 MED ORDER — SODIUM CHLORIDE 0.9 % IV SOLN
Freq: Once | INTRAVENOUS | Status: AC
  Administered 2012-03-14: 10:00:00 via INTRAVENOUS

## 2012-03-14 MED ORDER — DEXAMETHASONE SODIUM PHOSPHATE 10 MG/ML IJ SOLN
10.0000 mg | Freq: Once | INTRAMUSCULAR | Status: AC
  Administered 2012-03-14: 10 mg via INTRAVENOUS

## 2012-03-14 MED ORDER — HEPARIN SOD (PORK) LOCK FLUSH 100 UNIT/ML IV SOLN
500.0000 [IU] | Freq: Once | INTRAVENOUS | Status: AC | PRN
  Administered 2012-03-14: 500 [IU]
  Filled 2012-03-14: qty 5

## 2012-03-14 MED ORDER — FOLIC ACID 1 MG PO TABS: 1.0000 mg | ORAL_TABLET | Freq: Every day | ORAL | Status: DC

## 2012-03-14 MED ORDER — DEXAMETHASONE 4 MG PO TABS: 4.0000 mg | ORAL_TABLET | Freq: Two times a day (BID) | ORAL | Status: DC

## 2012-03-14 NOTE — Telephone Encounter (Signed)
Per staff message and POF I have scheduled appts.  JMW  

## 2012-03-14 NOTE — Progress Notes (Signed)
[facility] [phone]   Fax:([phone]  OFFICE PROGRESS NOTE  [name], MD [address] [place] [address]  DIAGNOSIS: Metastatic non-small cell lung cancer adenocarcinoma diagnosed in September 2010   PRIOR THERAPY:  #1 status post 6 cycles of systemic chemotherapy with carboplatin, Alimta and Avastin given every 3 weeks last dose given 04/23/2009 with disease stabilization.  #2 status post palliative radiotherapy to the mediastinum under the care of Dr. Michell Heinrich. The patient received a total dose of 3000 cGY and 12 fractions completed 01/21/2009.  #3 Maintenance chemotherapy with Alimta at 500 mg per meter squared and Avastin at 15 mg per kilogram given every 3 weeks status post 35 cycles.   CURRENT THERAPY: Maintenance chemotherapy with Alimta 500 mg/M2 and Avastin 15 mg/kg every 4 weeks,status post 9 cycles.  INTERVAL HISTORY: [name] 66 y.o. male returns to the clinic today for followup visit accompanied by his wife. The patient is doing fine today with no specific complaints. He is tolerating his treatment with maintenance Alimta and Avastin fairly well. He denied having any significant nausea or vomiting, no fever or chills. He denied having any significant weight loss or night sweats. The patient has no chest pain, shortness breath, cough or hemoptysis. He had repeat CT scan of the chest, abdomen and pelvis performed recently and he is here today for evaluation and discussion of his scan results.  MEDICAL HISTORY: Past Medical History  Diagnosis Date  . Hypertension   . Arthritis RIGHT SHOULDER  . Immature cataract BILATERAL  . Non-small cell lung cancer DX SEPT 2010  W/ CHEMORADIATION AT THAT TIME -- NOW  W/ METS--  CURRENTLY ON MAINTENANCE CHEMO TX  EVERY 30 DAYS    ONCOLOGIST- DR [facility]  . Acute meniscal tear of knee RIGHT KNEE  . BPH (benign prostatic hypertrophy)     ALLERGIES:  is allergic to percocet  and zolpidem tartrate.  MEDICATIONS:  Current Outpatient Prescriptions  Medication Sig Dispense Refill  . amLODipine (NORVASC) 2.5 MG tablet Take 2.5 mg by mouth every morning.       Marland Kitchen atenolol-chlorthalidone (TENORETIC) 50-25 MG per tablet Take 1 tablet by mouth Daily.      . B Complex-Biotin-FA (B-COMPLEX PO) Take 1 tablet by mouth daily. 1500mg  daily      . dexamethasone (DECADRON) 4 MG tablet Take 4 mg by mouth 2 (two) times daily with a meal. 1 tablet twice a day, the day before, day of and day after chemo      . doxazosin (CARDURA) 2 MG tablet Take 2 mg by mouth at bedtime.       . fexofenadine (ALLEGRA) 180 MG tablet Take 180 mg by mouth daily as needed.       Marland Kitchen HYDROcodone-homatropine (HYCODAN) 5-1.5 MG/5ML syrup Take 5 mLs by mouth every 6 (six) hours as needed.  120 mL  0  . Multiple Vitamin (MULTIVITAMIN) tablet Take 1 tablet by mouth daily.       Marland Kitchen omeprazole (PRILOSEC) 40 MG capsule Take 40 mg by mouth every evening.      . prochlorperazine (COMPAZINE) 10 MG tablet Take 10 mg by mouth every 6 (six) hours as needed.       Bernadette Hoit Sodium (SENOKOT S PO) Take 2 tablets by mouth daily. 2 tabs daily      . ibuprofen (ADVIL,MOTRIN) 200 MG tablet Take 200 mg by mouth every 6 (six) hours as needed.  SURGICAL HISTORY:  Past Surgical History  Procedure Date  . 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 Rotator cuff repair 2008    LEFT SHOULDER  . Bilateral ear drum surgery 1960'S  . Orif left ring finger  and revascularization of radial side 02-16-2007    DEGLOVING INJURY  . Knee arthroscopy 12/21/2011    Procedure: ARTHROSCOPY KNEE;  Surgeon: Jacki Cones, MD;  Location: [facility];  Service: Orthopedics;  Laterality: Right;  WITH MEDIAL MENISECTOMY    REVIEW OF SYSTEMS:  A comprehensive review of systems was negative.   PHYSICAL EXAMINATION: General appearance: alert,  cooperative and no distress Head: Normocephalic, without obvious abnormality, atraumatic Neck: no adenopathy Lymph nodes: Cervical, supraclavicular, and axillary nodes normal. Resp: clear to auscultation bilaterally Cardio: regular rate and rhythm, S1, S2 normal, no murmur, click, rub or gallop GI: soft, non-tender; bowel sounds normal; no masses,  no organomegaly Extremities: extremities normal, atraumatic, no cyanosis or edema Neurologic: Alert and oriented X 3, normal strength and tone. Normal symmetric reflexes. Normal coordination and gait  ECOG PERFORMANCE STATUS: 1 - Symptomatic but completely ambulatory  Blood pressure 129/82, pulse 62, temperature 96.5 F (35.8 C), temperature source Oral, resp. rate 20, height 5\' 7"  (1.702 m), weight 141 lb 11.2 oz (64.275 kg).  LABORATORY DATA: Lab Results  Component Value Date   WBC 6.9 03/14/2012   HGB 12.7* 03/14/2012   HCT 37.1* 03/14/2012   MCV 99.2* 03/14/2012   PLT 158 03/14/2012      Chemistry      Component Value Date/Time   NA 135* 02/15/2012 0858   NA 140 12/21/2011 1252   K 4.1 02/15/2012 0858   K 3.3* 12/21/2011 1252   CL 102 02/15/2012 0858   CL 101 11/18/2011 0858   CO2 25 02/15/2012 0858   CO2 22 11/18/2011 0858   BUN 26.0 02/15/2012 0858   BUN 19 11/18/2011 0858   CREATININE 1.2 02/15/2012 0858   CREATININE 1.34 11/18/2011 0858      Component Value Date/Time   CALCIUM 9.9 02/15/2012 0858   CALCIUM 9.5 11/18/2011 0858   ALKPHOS 82 02/15/2012 0858   ALKPHOS 55 11/18/2011 0858   AST 30 02/15/2012 0858   AST 25 11/18/2011 0858   ALT 22 02/15/2012 0858   ALT 17 11/18/2011 0858   BILITOT 0.82 02/15/2012 0858   BILITOT 0.5 11/18/2011 0858       RADIOGRAPHIC STUDIES: Ct Chest W Contrast  03/09/2012  *RADIOLOGY REPORT*  Clinical Data:  Lung cancer diagnosed September 2010.  Chemotherapy ongoing.  Radiation therapy complete.  CT CHEST, ABDOMEN AND PELVIS WITH CONTRAST  Technique:  Multidetector CT imaging of the  chest, abdomen and pelvis was performed following the standard protocol during bolus administration of intravenous contrast.  Contrast: OMNIPAQUE IOHEXOL 300 MG/ML  SOLN  Comparison:  CT scan 12/02/2011   CT CHEST  Findings:  There is a port in the right anterior chest wall.  No axillary or supraclavicular lymphadenopathy.  No mediastinal or hilar lymphadenopathy.  No pericardial fluid.  Coronary calcifications are noted.  Esophagus is normal.  There are small paratracheal lymph nodes which are not pathologically enlarged.  Review of the lung parenchyma demonstrates paramediastinal fibrotic change in linear pattern in the right upper lobe consistent radiation injury.  Nodular pleural thickening at the left apex is unchanged.  No new or suspicious pulmonary nodules.  IMPRESSION: 1.  Stable exam chest. 2.  Post therapy change in right upper lobe. 3.  No evidence of lung cancer recurrence.   CT ABDOMEN AND PELVIS  Findings:  No focal hepatic lesion.  Gallbladder, pancreas, spleen, adrenal glands, and kidneys are normal.  The stomach, small bowel, and colon are normal.  Abdominal aorta normal caliber.  No retroperitoneal periportal lymphadenopathy.  No free fluid the pelvis.  Bladder prostate gland normal.  No pelvic lymphadenopathy. Review of  bone windows demonstrates no aggressive osseous lesions.  IMPRESSION: No evidence metastasis in the abdomen or pelvis.   Original Report Authenticated By: Genevive Bi, M.D.     ASSESSMENT: This is a very pleasant 66 years old white male with metastatic non-small cell lung cancer, adenocarcinoma currently undergoing maintenance chemotherapy with Alimta and Avastin. The patient is tolerating his treatment fairly well and he has no evidence for disease progression on his recent scan.  PLAN: I discussed the scan results with the patient and his wife. I recommended for him to continue with the same maintenance regimen with Alimta and Avastin every 4 weeks. He would  come back for followup visit in 4 weeks with the next cycle of his chemotherapy. The patient was given a refill today for his medication with folic acid, Decadron as well as Prilosec. He was advised to call me immediately if he has any concerning symptoms in the interval.  All questions were answered. The patient knows to call the clinic with any problems, questions or concerns. We can certainly see the patient much sooner if necessary.  I spent 15 minutes counseling the patient face to face. The total time spent in the appointment was 25 minutes.

## 2012-03-14 NOTE — Patient Instructions (Signed)
The scan showed stable disease. Continue maintenance treatment with Alimta and Avastin. Followup in 4 week

## 2012-03-14 NOTE — Telephone Encounter (Signed)
gv and printed appt schedule for pt for Jan 2014...emailed michelle to add chemo...the patient aware °

## 2012-03-14 NOTE — Patient Instructions (Signed)
[facility] Discharge Instructions for Patients Receiving Chemotherapy  Today you received the following chemotherapy agents Avastin and Alimta  To help prevent nausea and vomiting after your treatment, we encourage you to take your nausea medication as prescribed.   If you develop nausea and vomiting that is not controlled by your nausea medication, call the clinic. If it is after clinic hours your family physician or the after hours number for the clinic or go to the Emergency Department.   BELOW ARE SYMPTOMS THAT SHOULD BE REPORTED IMMEDIATELY:  *FEVER GREATER THAN 100.5 F  *CHILLS WITH OR WITHOUT FEVER  NAUSEA AND VOMITING THAT IS NOT CONTROLLED WITH YOUR NAUSEA MEDICATION  *UNUSUAL SHORTNESS OF BREATH  *UNUSUAL BRUISING OR BLEEDING  TENDERNESS IN MOUTH AND THROAT WITH OR WITHOUT PRESENCE OF ULCERS  *URINARY PROBLEMS  *BOWEL PROBLEMS  UNUSUAL RASH Items with * indicate a potential emergency and should be followed up as soon as possible.  Feel free to call the clinic you have any questions or concerns. The clinic phone number is [phone].   I have been informed and understand all the instructions given to me. I know to contact the clinic, my physician, or go to the Emergency Department if any problems should occur. I do not have any questions at this time, but understand that I may call the clinic during office hours   should I have any questions or need assistance in obtaining follow up care.  

## 2012-03-15 ENCOUNTER — Other Ambulatory Visit: Payer: Self-pay | Admitting: *Deleted

## 2012-03-15 DIAGNOSIS — C3410 Malignant neoplasm of upper lobe, unspecified bronchus or lung: Secondary | ICD-10-CM

## 2012-03-15 MED ORDER — OMEPRAZOLE 40 MG PO CPDR: 40.0000 mg | DELAYED_RELEASE_CAPSULE | Freq: Every evening | ORAL | Status: DC

## 2012-03-15 MED ORDER — DEXAMETHASONE 4 MG PO TABS: 4.0000 mg | ORAL_TABLET | Freq: Two times a day (BID) | ORAL | Status: DC

## 2012-03-15 MED ORDER — FOLIC ACID 1 MG PO TABS: 1.0000 mg | ORAL_TABLET | Freq: Every day | ORAL | Status: DC

## 2012-03-23 ENCOUNTER — Telehealth: Payer: Self-pay | Admitting: Medical Oncology

## 2012-03-23 DIAGNOSIS — C3410 Malignant neoplasm of upper lobe, unspecified bronchus or lung: Secondary | ICD-10-CM

## 2012-03-23 NOTE — Telephone Encounter (Signed)
Asking if his meds will be sent to [facility] not [facility]. Faxed 3 RX to [facility]

## 2012-03-23 NOTE — Telephone Encounter (Signed)
refaxed rx to new fax number [phone]

## 2012-03-28 ENCOUNTER — Other Ambulatory Visit: Payer: Medicare Other | Admitting: Lab

## 2012-04-05 ENCOUNTER — Other Ambulatory Visit: Payer: Self-pay | Admitting: Medical Oncology

## 2012-04-05 DIAGNOSIS — C3490 Malignant neoplasm of unspecified part of unspecified bronchus or lung: Secondary | ICD-10-CM

## 2012-04-05 MED ORDER — HYDROCODONE-HOMATROPINE 5-1.5 MG/5ML PO SYRP: 5.0000 mL | ORAL_SOLUTION | Freq: Four times a day (QID) | ORAL | Status: DC | PRN

## 2012-04-05 NOTE — Telephone Encounter (Signed)
Requests refill -needs it prn cough. I called it in to [facility]

## 2012-04-06 ENCOUNTER — Telehealth: Payer: Self-pay | Admitting: Pulmonary Disease

## 2012-04-06 NOTE — Telephone Encounter (Signed)
Called pt x 3 to make next ov per recall.  Pt never returned our calls.  Mailed recall letter 04/06/12. Leanora Ivanoff

## 2012-04-11 ENCOUNTER — Encounter: Payer: Self-pay | Admitting: Internal Medicine

## 2012-04-11 ENCOUNTER — Ambulatory Visit (HOSPITAL_BASED_OUTPATIENT_CLINIC_OR_DEPARTMENT_OTHER): Payer: Medicare Other | Admitting: Internal Medicine

## 2012-04-11 ENCOUNTER — Telehealth: Payer: Self-pay | Admitting: Internal Medicine

## 2012-04-11 ENCOUNTER — Other Ambulatory Visit (HOSPITAL_BASED_OUTPATIENT_CLINIC_OR_DEPARTMENT_OTHER): Payer: Medicare Other | Admitting: Lab

## 2012-04-11 ENCOUNTER — Ambulatory Visit (HOSPITAL_BASED_OUTPATIENT_CLINIC_OR_DEPARTMENT_OTHER): Payer: Medicare Other

## 2012-04-11 ENCOUNTER — Telehealth: Payer: Self-pay | Admitting: *Deleted

## 2012-04-11 ENCOUNTER — Other Ambulatory Visit: Payer: Medicare Other | Admitting: Lab

## 2012-04-11 VITALS — BP 139/83 | HR 73 | Temp 97.2°F | Resp 20 | Ht 67.0 in | Wt 147.4 lb

## 2012-04-11 DIAGNOSIS — Z5112 Encounter for antineoplastic immunotherapy: Secondary | ICD-10-CM

## 2012-04-11 DIAGNOSIS — C3410 Malignant neoplasm of upper lobe, unspecified bronchus or lung: Secondary | ICD-10-CM

## 2012-04-11 DIAGNOSIS — Z5111 Encounter for antineoplastic chemotherapy: Secondary | ICD-10-CM

## 2012-04-11 DIAGNOSIS — C3490 Malignant neoplasm of unspecified part of unspecified bronchus or lung: Secondary | ICD-10-CM

## 2012-04-11 LAB — CBC WITH DIFFERENTIAL/PLATELET
Basophils Absolute: 0 10*3/uL (ref 0.0–0.1)
EOS%: 0 % (ref 0.0–7.0)
HCT: 36.7 % — ABNORMAL LOW (ref 38.4–49.9)
HGB: 12.4 g/dL — ABNORMAL LOW (ref 13.0–17.1)
MCH: 34.3 pg — ABNORMAL HIGH (ref 27.2–33.4)
MONO#: 0.1 10*3/uL (ref 0.1–0.9)
NEUT%: 87.6 % — ABNORMAL HIGH (ref 39.0–75.0)
lymph#: 0.6 10*3/uL — ABNORMAL LOW (ref 0.9–3.3)

## 2012-04-11 LAB — COMPREHENSIVE METABOLIC PANEL (CC13)
Albumin: 3.2 g/dL — ABNORMAL LOW (ref 3.5–5.0)
CO2: 21 mEq/L — ABNORMAL LOW (ref 22–29)
Calcium: 9.6 mg/dL (ref 8.4–10.4)
Chloride: 107 mEq/L (ref 98–107)
Glucose: 137 mg/dl — ABNORMAL HIGH (ref 70–99)
Potassium: 4.6 mEq/L (ref 3.5–5.1)
Sodium: 137 mEq/L (ref 136–145)
Total Protein: 6.6 g/dL (ref 6.4–8.3)

## 2012-04-11 MED ORDER — SODIUM CHLORIDE 0.9 % IV SOLN
15.0000 mg/kg | Freq: Once | INTRAVENOUS | Status: AC
  Administered 2012-04-11: 975 mg via INTRAVENOUS
  Filled 2012-04-11: qty 39

## 2012-04-11 MED ORDER — ONDANSETRON 8 MG/50ML IVPB (CHCC)
8.0000 mg | Freq: Once | INTRAVENOUS | Status: AC
  Administered 2012-04-11: 8 mg via INTRAVENOUS

## 2012-04-11 MED ORDER — SODIUM CHLORIDE 0.9 % IV SOLN
500.0000 mg/m2 | Freq: Once | INTRAVENOUS | Status: AC
  Administered 2012-04-11: 875 mg via INTRAVENOUS
  Filled 2012-04-11: qty 35

## 2012-04-11 MED ORDER — SODIUM CHLORIDE 0.9 % IJ SOLN
10.0000 mL | INTRAMUSCULAR | Status: DC | PRN
  Administered 2012-04-11: 10 mL
  Filled 2012-04-11: qty 10

## 2012-04-11 MED ORDER — DEXAMETHASONE SODIUM PHOSPHATE 10 MG/ML IJ SOLN
10.0000 mg | Freq: Once | INTRAMUSCULAR | Status: AC
Start: 2012-04-11 — End: 2012-04-11
  Administered 2012-04-11: 10 mg via INTRAVENOUS

## 2012-04-11 MED ORDER — SODIUM CHLORIDE 0.9 % IV SOLN
Freq: Once | INTRAVENOUS | Status: AC
  Administered 2012-04-11: 10:00:00 via INTRAVENOUS

## 2012-04-11 MED ORDER — HEPARIN SOD (PORK) LOCK FLUSH 100 UNIT/ML IV SOLN
500.0000 [IU] | Freq: Once | INTRAVENOUS | Status: AC | PRN
  Administered 2012-04-11: 500 [IU]
  Filled 2012-04-11: qty 5

## 2012-04-11 NOTE — Patient Instructions (Signed)
[facility] Discharge Instructions for Patients Receiving Chemotherapy  Today you received the following chemotherapy agents Avastin and Alimta  To help prevent nausea and vomiting after your treatment, we encourage you to take your nausea medication as prescribed.   If you develop nausea and vomiting that is not controlled by your nausea medication, call the clinic. If it is after clinic hours your family physician or the after hours number for the clinic or go to the Emergency Department.   BELOW ARE SYMPTOMS THAT SHOULD BE REPORTED IMMEDIATELY:  *FEVER GREATER THAN 100.5 F  *CHILLS WITH OR WITHOUT FEVER  NAUSEA AND VOMITING THAT IS NOT CONTROLLED WITH YOUR NAUSEA MEDICATION  *UNUSUAL SHORTNESS OF BREATH  *UNUSUAL BRUISING OR BLEEDING  TENDERNESS IN MOUTH AND THROAT WITH OR WITHOUT PRESENCE OF ULCERS  *URINARY PROBLEMS  *BOWEL PROBLEMS  UNUSUAL RASH Items with * indicate a potential emergency and should be followed up as soon as possible.  Feel free to call the clinic you have any questions or concerns. The clinic phone number is [phone].   I have been informed and understand all the instructions given to me. I know to contact the clinic, my physician, or go to the Emergency Department if any problems should occur. I do not have any questions at this time, but understand that I may call the clinic during office hours   should I have any questions or need assistance in obtaining follow up care.  

## 2012-04-11 NOTE — Progress Notes (Signed)
[facility] Telephone:(336) [phone]   Fax:([phone]  OFFICE PROGRESS NOTE  [name], MD [address] [place] [address]  DIAGNOSIS: Metastatic non-small cell lung cancer adenocarcinoma diagnosed in September 2010   PRIOR THERAPY:  #1 status post 6 cycles of systemic chemotherapy with carboplatin, Alimta and Avastin given every 3 weeks last dose given 04/23/2009 with disease stabilization.  #2 status post palliative radiotherapy to the mediastinum under the care of Dr. Michell Heinrich. The patient received a total dose of 3000 cGY and 12 fractions completed 01/21/2009.  #3 Maintenance chemotherapy with Alimta at 500 mg per meter squared and Avastin at 15 mg per kilogram given every 3 weeks status post 35 cycles.   CURRENT THERAPY: Maintenance chemotherapy with Alimta 500 mg/M2 and Avastin 15 mg/kg every 4 weeks,status post 10 cycles.  INTERVAL HISTORY: [name] 67 y.o. male returns to the clinic today for followup visit. The patient is feeling fine today with no specific complaints. He denied having any significant chest pain, shortness breath, cough or hemoptysis. He denied having any weight loss or night sweats. He is tolerating his maintenance treatment with Alimta and Avastin fairly well with no significant adverse effects.   MEDICAL HISTORY: Past Medical History  Diagnosis Date  . Hypertension   . Arthritis RIGHT SHOULDER  . Immature cataract BILATERAL  . Non-small cell lung cancer DX SEPT 2010  W/ CHEMORADIATION AT THAT TIME -- NOW  W/ METS--  CURRENTLY ON MAINTENANCE CHEMO TX  EVERY 30 DAYS    ONCOLOGIST- DR [facility]  . Acute meniscal tear of knee RIGHT KNEE  . BPH (benign prostatic hypertrophy)     ALLERGIES:  is allergic to percocet and zolpidem tartrate.  MEDICATIONS:  Current Outpatient Prescriptions  Medication Sig Dispense Refill  . amLODipine (NORVASC) 2.5 MG tablet Take 2.5 mg by mouth every morning.       Marland Kitchen atenolol  (TENORMIN) 100 MG tablet Take 100 mg by mouth daily.      . B Complex-Biotin-FA (B-COMPLEX PO) Take 1 tablet by mouth daily. 1500mg  daily      . cetirizine (ZYRTEC) 10 MG tablet Take 10 mg by mouth daily.      Marland Kitchen dexamethasone (DECADRON) 4 MG tablet Take 1 tablet (4 mg total) by mouth 2 (two) times daily with a meal. 1 tablet twice a day, the day before, day of and day after chemo  50 tablet  1  . doxazosin (CARDURA) 2 MG tablet Take 2 mg by mouth at bedtime.       . fexofenadine (ALLEGRA) 180 MG tablet Take 180 mg by mouth daily as needed.       . folic acid (FOLVITE) 1 MG tablet Take 1 tablet (1 mg total) by mouth daily.  90 tablet  3  . HYDROcodone-homatropine (HYCODAN) 5-1.5 MG/5ML syrup Take 5 mLs by mouth every 6 (six) hours as needed.  120 mL  0  . Multiple Vitamin (MULTIVITAMIN) tablet Take 1 tablet by mouth daily.       Marland Kitchen omeprazole (PRILOSEC) 40 MG capsule Take 1 capsule (40 mg total) by mouth every evening.  90 capsule  3  . Sennosides-Docusate Sodium (SENOKOT S PO) Take 2 tablets by mouth daily. 2 tabs daily        SURGICAL HISTORY:  Past Surgical History  Procedure Date  . 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  Rotator cuff repair 2008    LEFT SHOULDER  . Bilateral ear drum surgery 1960'S  . Orif left ring finger  and revascularization of radial side 02-16-2007    DEGLOVING INJURY  . Knee arthroscopy 12/21/2011    Procedure: ARTHROSCOPY KNEE;  Surgeon: Jacki Cones, MD;  Location: [facility];  Service: Orthopedics;  Laterality: Right;  WITH MEDIAL MENISECTOMY    REVIEW OF SYSTEMS:  A comprehensive review of systems was negative.   PHYSICAL EXAMINATION: General appearance: alert, cooperative and no distress Head: Normocephalic, without obvious abnormality, atraumatic Neck: no adenopathy Lymph nodes: Cervical, supraclavicular, and axillary nodes normal. Resp: clear to auscultation  bilaterally Cardio: regular rate and rhythm, S1, S2 normal, no murmur, click, rub or gallop GI: soft, non-tender; bowel sounds normal; no masses,  no organomegaly Extremities: extremities normal, atraumatic, no cyanosis or edema Neurologic: Alert and oriented X 3, normal strength and tone. Normal symmetric reflexes. Normal coordination and gait  ECOG PERFORMANCE STATUS: 0 - Asymptomatic  Blood pressure 139/83, pulse 73, temperature 97.2 F (36.2 C), temperature source Oral, resp. rate 20, height 5\' 7"  (1.702 m), weight 147 lb 6.4 oz (66.86 kg).  LABORATORY DATA: Lab Results  Component Value Date   WBC 5.4 04/11/2012   HGB 12.4* 04/11/2012   HCT 36.7* 04/11/2012   MCV 101.7* 04/11/2012   PLT 162 04/11/2012      Chemistry      Component Value Date/Time   NA 140 03/14/2012 0912   NA 140 12/21/2011 1252   K 3.9 03/14/2012 0912   K 3.3* 12/21/2011 1252   CL 102 03/14/2012 0912   CL 101 11/18/2011 0858   CO2 28 03/14/2012 0912   CO2 22 11/18/2011 0858   BUN 26.0 03/14/2012 0912   BUN 19 11/18/2011 0858   CREATININE 1.4* 03/14/2012 0912   CREATININE 1.34 11/18/2011 0858      Component Value Date/Time   CALCIUM 10.0 03/14/2012 0912   CALCIUM 9.5 11/18/2011 0858   ALKPHOS 76 03/14/2012 0912   ALKPHOS 55 11/18/2011 0858   AST 27 03/14/2012 0912   AST 25 11/18/2011 0858   ALT 18 03/14/2012 0912   ALT 17 11/18/2011 0858   BILITOT 0.67 03/14/2012 0912   BILITOT 0.5 11/18/2011 0858       RADIOGRAPHIC STUDIES: No results found.  ASSESSMENT: This is a very pleasant 67 years old white male with metastatic non-small cell lung cancer, adenocarcinoma currently on maintenance therapy with Alimta and Avastin is status post total of 45 cycles.   PLAN: The patient is doing fine and tolerating his treatment fairly well. I recommended for him to proceed with cycle #46 today as scheduled. He would come back for followup visit in 4 weeks with the start of cycle #47. He was advised to call immediately  if he has any concerning symptoms in the interval.  All questions were answered. The patient knows to call the clinic with any problems, questions or concerns. We can certainly see the patient much sooner if necessary.  I spent 15 minutes counseling the patient face to face. The total time spent in the appointment was 25 minutes.

## 2012-04-11 NOTE — Patient Instructions (Signed)
Continue chemotherapy today as scheduled. Followup visit in 4 weeks.

## 2012-04-11 NOTE — Telephone Encounter (Signed)
gv and printed appt schedule for pt...emailed michelle to add tx.

## 2012-04-11 NOTE — Telephone Encounter (Signed)
Per staff message and POF I have scheduled appts.  JMW  

## 2012-04-11 NOTE — Progress Notes (Signed)
Dr. Arbutus Ped aware of urine protein results.  Okay to treat with Avastin today per Dr. Cathie Hoops, rn

## 2012-04-11 NOTE — Telephone Encounter (Signed)
Left Message - lvm for pt regarding 2.11.14 appt .Marland KitchenMarland KitchenMarland KitchenMarland Kitchenpt was already gone from tx room    By Collier Salina

## 2012-04-18 ENCOUNTER — Other Ambulatory Visit: Payer: Medicare Other | Admitting: Lab

## 2012-05-09 ENCOUNTER — Telehealth: Payer: Self-pay | Admitting: *Deleted

## 2012-05-09 ENCOUNTER — Telehealth: Payer: Self-pay | Admitting: Internal Medicine

## 2012-05-09 ENCOUNTER — Encounter: Payer: Self-pay | Admitting: Physician Assistant

## 2012-05-09 ENCOUNTER — Other Ambulatory Visit: Payer: Medicare Other | Admitting: Lab

## 2012-05-09 ENCOUNTER — Other Ambulatory Visit (HOSPITAL_BASED_OUTPATIENT_CLINIC_OR_DEPARTMENT_OTHER): Payer: Medicare Other | Admitting: Lab

## 2012-05-09 ENCOUNTER — Ambulatory Visit (HOSPITAL_BASED_OUTPATIENT_CLINIC_OR_DEPARTMENT_OTHER): Payer: Medicare Other | Admitting: Physician Assistant

## 2012-05-09 ENCOUNTER — Ambulatory Visit (HOSPITAL_BASED_OUTPATIENT_CLINIC_OR_DEPARTMENT_OTHER): Payer: Medicare Other

## 2012-05-09 ENCOUNTER — Ambulatory Visit: Payer: Medicare Other

## 2012-05-09 VITALS — BP 144/81 | HR 69 | Temp 97.4°F | Resp 18 | Ht 67.0 in | Wt 143.3 lb

## 2012-05-09 DIAGNOSIS — C3410 Malignant neoplasm of upper lobe, unspecified bronchus or lung: Secondary | ICD-10-CM

## 2012-05-09 DIAGNOSIS — Z5111 Encounter for antineoplastic chemotherapy: Secondary | ICD-10-CM

## 2012-05-09 DIAGNOSIS — C3490 Malignant neoplasm of unspecified part of unspecified bronchus or lung: Secondary | ICD-10-CM

## 2012-05-09 DIAGNOSIS — Z5112 Encounter for antineoplastic immunotherapy: Secondary | ICD-10-CM

## 2012-05-09 LAB — COMPREHENSIVE METABOLIC PANEL (CC13)
Albumin: 3.4 g/dL — ABNORMAL LOW (ref 3.5–5.0)
Alkaline Phosphatase: 78 U/L (ref 40–150)
BUN: 18.3 mg/dL (ref 7.0–26.0)
Calcium: 10 mg/dL (ref 8.4–10.4)
Chloride: 106 mEq/L (ref 98–107)
Glucose: 94 mg/dl (ref 70–99)
Potassium: 4.1 mEq/L (ref 3.5–5.1)

## 2012-05-09 LAB — CBC WITH DIFFERENTIAL/PLATELET
BASO%: 1.2 % (ref 0.0–2.0)
Eosinophils Absolute: 0.2 10*3/uL (ref 0.0–0.5)
MONO#: 0.6 10*3/uL (ref 0.1–0.9)
NEUT#: 3.9 10*3/uL (ref 1.5–6.5)
RBC: 3.52 10*6/uL — ABNORMAL LOW (ref 4.20–5.82)
RDW: 16.1 % — ABNORMAL HIGH (ref 11.0–14.6)
WBC: 5.9 10*3/uL (ref 4.0–10.3)
nRBC: 0 % (ref 0–0)

## 2012-05-09 LAB — UA PROTEIN, DIPSTICK - CHCC: Protein, ur: 30 mg/dL

## 2012-05-09 MED ORDER — ONDANSETRON 8 MG/50ML IVPB (CHCC)
8.0000 mg | Freq: Once | INTRAVENOUS | Status: AC
  Administered 2012-05-09: 8 mg via INTRAVENOUS

## 2012-05-09 MED ORDER — DEXAMETHASONE SODIUM PHOSPHATE 10 MG/ML IJ SOLN
10.0000 mg | Freq: Once | INTRAMUSCULAR | Status: AC
  Administered 2012-05-09: 10 mg via INTRAVENOUS

## 2012-05-09 MED ORDER — SODIUM CHLORIDE 0.9 % IJ SOLN
10.0000 mL | INTRAMUSCULAR | Status: DC | PRN
  Administered 2012-05-09: 10 mL
  Filled 2012-05-09: qty 10

## 2012-05-09 MED ORDER — CYANOCOBALAMIN 1000 MCG/ML IJ SOLN
1000.0000 ug | Freq: Once | INTRAMUSCULAR | Status: AC
  Administered 2012-05-09: 1000 ug via INTRAMUSCULAR

## 2012-05-09 MED ORDER — SODIUM CHLORIDE 0.9 % IV SOLN
Freq: Once | INTRAVENOUS | Status: AC
  Administered 2012-05-09: 11:00:00 via INTRAVENOUS

## 2012-05-09 MED ORDER — SODIUM CHLORIDE 0.9 % IV SOLN
15.0000 mg/kg | Freq: Once | INTRAVENOUS | Status: AC
  Administered 2012-05-09: 975 mg via INTRAVENOUS
  Filled 2012-05-09: qty 39

## 2012-05-09 MED ORDER — HEPARIN SOD (PORK) LOCK FLUSH 100 UNIT/ML IV SOLN
500.0000 [IU] | Freq: Once | INTRAVENOUS | Status: AC | PRN
  Administered 2012-05-09: 500 [IU]
  Filled 2012-05-09: qty 5

## 2012-05-09 MED ORDER — SODIUM CHLORIDE 0.9 % IV SOLN
500.0000 mg/m2 | Freq: Once | INTRAVENOUS | Status: AC
  Administered 2012-05-09: 875 mg via INTRAVENOUS
  Filled 2012-05-09: qty 35

## 2012-05-09 NOTE — Patient Instructions (Addendum)
[facility] Discharge Instructions for Patients Receiving Chemotherapy  Today you received the following chemotherapy agents avastin/alimta  To help prevent nausea and vomiting after your treatment, we encourage you to take your nausea medication  and take it as often as prescribed   If you develop nausea and vomiting that is not controlled by your nausea medication, call the clinic. If it is after clinic hours your family physician or the after hours number for the clinic or go to the Emergency Department.   BELOW ARE SYMPTOMS THAT SHOULD BE REPORTED IMMEDIATELY:  *FEVER GREATER THAN 100.5 F  *CHILLS WITH OR WITHOUT FEVER  NAUSEA AND VOMITING THAT IS NOT CONTROLLED WITH YOUR NAUSEA MEDICATION  *UNUSUAL SHORTNESS OF BREATH  *UNUSUAL BRUISING OR BLEEDING  TENDERNESS IN MOUTH AND THROAT WITH OR WITHOUT PRESENCE OF ULCERS  *URINARY PROBLEMS  *BOWEL PROBLEMS  UNUSUAL RASH Items with * indicate a potential emergency and should be followed up as soon as possible.  One of the nurses will contact you 24 hours after your treatment. Please let the nurse know about any problems that you may have experienced. Feel free to call the clinic you have any questions or concerns. The clinic phone number is [phone].   I have been informed and understand all the instructions given to me. I know to contact the clinic, my physician, or go to the Emergency Department if any problems should occur. I do not have any questions at this time, but understand that I may call the clinic during office hours   should I have any questions or need assistance in obtaining follow up care.    __________________________________________  _____________  __________ Signature of Patient or Authorized Representative            Date                   Time    __________________________________________ Nurse's Signature

## 2012-05-09 NOTE — Telephone Encounter (Signed)
Per staff message and POF I have scheduled appts.  JMW  

## 2012-05-09 NOTE — Patient Instructions (Addendum)
Follow up with Dr. Mohamed in 1 month with a restaging CT scan of your chest, abdomen and pelvis to re-evaluate your disease 

## 2012-05-09 NOTE — Progress Notes (Signed)
[facility] Telephone:(336) [phone]   Fax:(336) [phone]  OFFICE PROGRESS NOTE  [name], MD [address] [place] [address]  DIAGNOSIS: Metastatic non-small cell lung cancer adenocarcinoma diagnosed in September 2010   PRIOR THERAPY:  #1 status post 6 cycles of systemic chemotherapy with carboplatin, Alimta and Avastin given every 3 weeks last dose given 04/23/2009 with disease stabilization.  #2 status post palliative radiotherapy to the mediastinum under the care of Dr. Michell Heinrich. The patient received a total dose of 3000 cGY and 12 fractions completed 01/21/2009.  #3 Maintenance chemotherapy with Alimta at 500 mg per meter squared and Avastin at 15 mg per kilogram given every 3 weeks status post 35 cycles.   CURRENT THERAPY: Maintenance chemotherapy with Alimta 500 mg/M2 and Avastin 15 mg/kg every 4 weeks,status post 11 cycles.  INTERVAL HISTORY: [name] 67 y.o. male returns to the clinic today for followup visit. The patient is feeling fine today with no specific complaints. He denied having any significant chest pain, shortness breath, cough or hemoptysis. He denied having any weight loss or night sweats. He reports he has resumed taking his folvane plus. He also was discontinued the Zyrtec and is now taking Allegra. He is tolerating his maintenance treatment with Alimta and Avastin fairly well with no significant adverse effects.   MEDICAL HISTORY: Past Medical History  Diagnosis Date  . Hypertension   . Arthritis RIGHT SHOULDER  . Immature cataract BILATERAL  . Non-small cell lung cancer DX SEPT 2010  W/ CHEMORADIATION AT THAT TIME -- NOW  W/ METS--  CURRENTLY ON MAINTENANCE CHEMO TX  EVERY 30 DAYS    ONCOLOGIST- DR [facility]  . Acute meniscal tear of knee RIGHT KNEE  . BPH (benign prostatic hypertrophy)     ALLERGIES:  is allergic to percocet and zolpidem tartrate.  MEDICATIONS:  Current Outpatient Prescriptions  Medication  Sig Dispense Refill  . Ascorbic Acid (VITAMIN C) 1000 MG tablet Take 1,000 mg by mouth daily.      Marland Kitchen FeFum-FePoly-FA-B Cmp-C-Biot (INTEGRA PLUS) CAPS Take 1 capsule by mouth daily.      Marland Kitchen amLODipine (NORVASC) 2.5 MG tablet Take 2.5 mg by mouth every morning.       Marland Kitchen atenolol (TENORMIN) 100 MG tablet Take 100 mg by mouth daily.      . B Complex-Biotin-FA (B-COMPLEX PO) Take 1 tablet by mouth daily. 1500mg  daily      . dexamethasone (DECADRON) 4 MG tablet Take 1 tablet (4 mg total) by mouth 2 (two) times daily with a meal. 1 tablet twice a day, the day before, day of and day after chemo  50 tablet  1  . doxazosin (CARDURA) 2 MG tablet Take 2 mg by mouth at bedtime.       . fexofenadine (ALLEGRA) 180 MG tablet Take 180 mg by mouth daily as needed.       . folic acid (FOLVITE) 1 MG tablet Take 1 tablet (1 mg total) by mouth daily.  90 tablet  3  . HYDROcodone-homatropine (HYCODAN) 5-1.5 MG/5ML syrup Take 5 mLs by mouth every 6 (six) hours as needed.  120 mL  0  . Multiple Vitamin (MULTIVITAMIN) tablet Take 1 tablet by mouth daily.       Marland Kitchen omeprazole (PRILOSEC) 40 MG capsule Take 1 capsule (40 mg total) by mouth every evening.  90 capsule  3  . Sennosides-Docusate Sodium (SENOKOT S PO) Take 2 tablets by mouth daily. 2  tabs daily       No current facility-administered medications for this visit.    SURGICAL HISTORY:  Past Surgical History  Procedure Laterality Date  . 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 Rotator cuff repair  2008    LEFT SHOULDER  . Bilateral ear drum surgery  1960'S  . Orif left ring finger  and revascularization of radial side  02-16-2007    DEGLOVING INJURY  . Knee arthroscopy  12/21/2011    Procedure: ARTHROSCOPY KNEE;  Surgeon: Jacki Cones, MD;  Location: [facility];  Service: Orthopedics;  Laterality: Right;  WITH MEDIAL MENISECTOMY    REVIEW OF SYSTEMS:  A comprehensive review  of systems was negative.   PHYSICAL EXAMINATION: General appearance: alert, cooperative and no distress Head: Normocephalic, without obvious abnormality, atraumatic Neck: no adenopathy Lymph nodes: Cervical, supraclavicular, and axillary nodes normal. Resp: clear to auscultation bilaterally Cardio: regular rate and rhythm, S1, S2 normal, no murmur, click, rub or gallop GI: soft, non-tender; bowel sounds normal; no masses,  no organomegaly Extremities: extremities normal, atraumatic, no cyanosis or edema Neurologic: Alert and oriented X 3, normal strength and tone. Normal symmetric reflexes. Normal coordination and gait  ECOG PERFORMANCE STATUS: 0 - Asymptomatic  Blood pressure 144/81, pulse 69, temperature 97.4 F (36.3 C), temperature source Oral, resp. rate 18, height 5\' 7"  (1.702 m), weight 143 lb 4.8 oz (65 kg).  LABORATORY DATA: Lab Results  Component Value Date   WBC 5.9 05/09/2012   HGB 11.9* 05/09/2012   HCT 35.6* 05/09/2012   MCV 101.1* 05/09/2012   PLT 142 05/09/2012      Chemistry      Component Value Date/Time   NA 137 04/11/2012 0836   NA 140 12/21/2011 1252   K 4.6 04/11/2012 0836   K 3.3* 12/21/2011 1252   CL 107 04/11/2012 0836   CL 101 11/18/2011 0858   CO2 21* 04/11/2012 0836   CO2 22 11/18/2011 0858   BUN 20.0 04/11/2012 0836   BUN 19 11/18/2011 0858   CREATININE 1.4* 04/11/2012 0836   CREATININE 1.34 11/18/2011 0858      Component Value Date/Time   CALCIUM 9.6 04/11/2012 0836   CALCIUM 9.5 11/18/2011 0858   ALKPHOS 89 04/11/2012 0836   ALKPHOS 55 11/18/2011 0858   AST 27 04/11/2012 0836   AST 25 11/18/2011 0858   ALT 18 04/11/2012 0836   ALT 17 11/18/2011 0858   BILITOT 0.47 04/11/2012 0836   BILITOT 0.5 11/18/2011 0858       RADIOGRAPHIC STUDIES: No results found.  ASSESSMENT/PLAN: This is a very pleasant 67 years old white male with metastatic non-small cell lung cancer, adenocarcinoma currently on maintenance therapy with Alimta and Avastin is status post  total of 46 cycles. The patient was discussed with Dr. Arbutus Ped. He'll proceed with cycle #47 today as scheduled. He will followup with Dr. Arbutus Ped in one month with a repeat CBC differential, C. met and CT of the chest, abdomen and pelvis with contrast to reevaluate his disease.  Alandria Butkiewicz E, PA-C   He was advised to call immediately if he has any concerning symptoms in the interval.  All questions were answered. The patient knows to call the clinic with any problems, questions or concerns. We can certainly see the patient much sooner if necessary.  I spent 20 minutes counseling the patient face to face. The total time spent  in the appointment was 30 minutes.

## 2012-05-13 ENCOUNTER — Other Ambulatory Visit: Payer: Self-pay

## 2012-05-30 ENCOUNTER — Other Ambulatory Visit: Payer: Medicare Other | Admitting: Lab

## 2012-06-02 ENCOUNTER — Ambulatory Visit (HOSPITAL_COMMUNITY)
Admission: RE | Admit: 2012-06-02 | Discharge: 2012-06-02 | Disposition: A | Discharge status: Home / Self Care | Payer: Medicare Other | Source: Ambulatory Visit | Attending: Physician Assistant | Admitting: Physician Assistant

## 2012-06-02 DIAGNOSIS — J701 Chronic and other pulmonary manifestations due to radiation: Secondary | ICD-10-CM | POA: Insufficient documentation

## 2012-06-02 DIAGNOSIS — C3490 Malignant neoplasm of unspecified part of unspecified bronchus or lung: Secondary | ICD-10-CM | POA: Insufficient documentation

## 2012-06-02 DIAGNOSIS — I700 Atherosclerosis of aorta: Secondary | ICD-10-CM | POA: Insufficient documentation

## 2012-06-02 DIAGNOSIS — C3410 Malignant neoplasm of upper lobe, unspecified bronchus or lung: Secondary | ICD-10-CM

## 2012-06-02 DIAGNOSIS — R599 Enlarged lymph nodes, unspecified: Secondary | ICD-10-CM | POA: Insufficient documentation

## 2012-06-02 DIAGNOSIS — Y842 Radiological procedure and radiotherapy as the cause of abnormal reaction of the patient, or of later complication, without mention of misadventure at the time of the procedure: Secondary | ICD-10-CM | POA: Insufficient documentation

## 2012-06-02 DIAGNOSIS — R911 Solitary pulmonary nodule: Secondary | ICD-10-CM | POA: Insufficient documentation

## 2012-06-02 MED ORDER — IOHEXOL 300 MG/ML  SOLN
100.0000 mL | Freq: Once | INTRAMUSCULAR | Status: AC | PRN
  Administered 2012-06-02: 100 mL via INTRAVENOUS

## 2012-06-06 ENCOUNTER — Ambulatory Visit (HOSPITAL_BASED_OUTPATIENT_CLINIC_OR_DEPARTMENT_OTHER): Payer: Medicare Other | Admitting: Internal Medicine

## 2012-06-06 ENCOUNTER — Ambulatory Visit (HOSPITAL_BASED_OUTPATIENT_CLINIC_OR_DEPARTMENT_OTHER): Payer: Medicare Other

## 2012-06-06 ENCOUNTER — Other Ambulatory Visit (HOSPITAL_BASED_OUTPATIENT_CLINIC_OR_DEPARTMENT_OTHER): Payer: Medicare Other | Admitting: Lab

## 2012-06-06 ENCOUNTER — Telehealth: Payer: Self-pay | Admitting: Internal Medicine

## 2012-06-06 ENCOUNTER — Encounter: Payer: Self-pay | Admitting: Internal Medicine

## 2012-06-06 VITALS — BP 151/89 | HR 62 | Temp 97.4°F | Resp 18 | Ht 67.0 in | Wt 145.1 lb

## 2012-06-06 DIAGNOSIS — Z5112 Encounter for antineoplastic immunotherapy: Secondary | ICD-10-CM

## 2012-06-06 DIAGNOSIS — Z452 Encounter for adjustment and management of vascular access device: Secondary | ICD-10-CM

## 2012-06-06 DIAGNOSIS — C7A090 Malignant carcinoid tumor of the bronchus and lung: Secondary | ICD-10-CM

## 2012-06-06 DIAGNOSIS — Z5111 Encounter for antineoplastic chemotherapy: Secondary | ICD-10-CM

## 2012-06-06 DIAGNOSIS — C3410 Malignant neoplasm of upper lobe, unspecified bronchus or lung: Secondary | ICD-10-CM

## 2012-06-06 DIAGNOSIS — C3490 Malignant neoplasm of unspecified part of unspecified bronchus or lung: Secondary | ICD-10-CM

## 2012-06-06 LAB — COMPREHENSIVE METABOLIC PANEL (CC13)
Alkaline Phosphatase: 81 U/L (ref 40–150)
Glucose: 141 mg/dl — ABNORMAL HIGH (ref 70–99)
Sodium: 140 mEq/L (ref 136–145)
Total Bilirubin: 0.68 mg/dL (ref 0.20–1.20)
Total Protein: 6.6 g/dL (ref 6.4–8.3)

## 2012-06-06 LAB — CBC WITH DIFFERENTIAL/PLATELET
BASO%: 0.7 % (ref 0.0–2.0)
HCT: 38.7 % (ref 38.4–49.9)
HGB: 12.8 g/dL — ABNORMAL LOW (ref 13.0–17.1)
MCHC: 33.1 g/dL (ref 32.0–36.0)
MONO#: 0.8 10*3/uL (ref 0.1–0.9)
NEUT#: 3.9 10*3/uL (ref 1.5–6.5)
NEUT%: 57.7 % (ref 39.0–75.0)
WBC: 6.7 10*3/uL (ref 4.0–10.3)
lymph#: 1.8 10*3/uL (ref 0.9–3.3)

## 2012-06-06 MED ORDER — ONDANSETRON 8 MG/50ML IVPB (CHCC)
8.0000 mg | Freq: Once | INTRAVENOUS | Status: AC
  Administered 2012-06-06: 8 mg via INTRAVENOUS

## 2012-06-06 MED ORDER — BEVACIZUMAB CHEMO INJECTION 400 MG/16ML
15.0000 mg/kg | Freq: Once | INTRAVENOUS | Status: AC
  Administered 2012-06-06: 975 mg via INTRAVENOUS
  Filled 2012-06-06: qty 39

## 2012-06-06 MED ORDER — ALTEPLASE 2 MG IJ SOLR
2.0000 mg | Freq: Once | INTRAMUSCULAR | Status: AC | PRN
  Administered 2012-06-06: 2 mg
  Filled 2012-06-06: qty 2

## 2012-06-06 MED ORDER — SODIUM CHLORIDE 0.9 % IV SOLN: Freq: Once | INTRAVENOUS | Status: DC

## 2012-06-06 MED ORDER — SODIUM CHLORIDE 0.9 % IJ SOLN
10.0000 mL | INTRAMUSCULAR | Status: DC | PRN
  Administered 2012-06-06: 10 mL
  Filled 2012-06-06: qty 10

## 2012-06-06 MED ORDER — HEPARIN SOD (PORK) LOCK FLUSH 100 UNIT/ML IV SOLN
500.0000 [IU] | Freq: Once | INTRAVENOUS | Status: AC | PRN
  Administered 2012-06-06: 500 [IU]
  Filled 2012-06-06: qty 5

## 2012-06-06 MED ORDER — DEXAMETHASONE SODIUM PHOSPHATE 10 MG/ML IJ SOLN
10.0000 mg | Freq: Once | INTRAMUSCULAR | Status: AC
  Administered 2012-06-06: 10 mg via INTRAVENOUS

## 2012-06-06 MED ORDER — SODIUM CHLORIDE 0.9 % IV SOLN
Freq: Once | INTRAVENOUS | Status: AC
  Administered 2012-06-06: 11:00:00 via INTRAVENOUS

## 2012-06-06 MED ORDER — SODIUM CHLORIDE 0.9 % IV SOLN
500.0000 mg/m2 | Freq: Once | INTRAVENOUS | Status: AC
  Administered 2012-06-06: 875 mg via INTRAVENOUS
  Filled 2012-06-06: qty 35

## 2012-06-06 NOTE — Patient Instructions (Addendum)
[facility] Discharge Instructions for Patients Receiving Chemotherapy  Today you received the following chemotherapy agents alimata, avastin  If you develop nausea and vomiting that is not controlled by your nausea medication, call the clinic. If it is after clinic hours your family physician or the after hours number for the clinic or go to the Emergency Department.   BELOW ARE SYMPTOMS THAT SHOULD BE REPORTED IMMEDIATELY:  *FEVER GREATER THAN 100.5 F  *CHILLS WITH OR WITHOUT FEVER  NAUSEA AND VOMITING THAT IS NOT CONTROLLED WITH YOUR NAUSEA MEDICATION  *UNUSUAL SHORTNESS OF BREATH  *UNUSUAL BRUISING OR BLEEDING  TENDERNESS IN MOUTH AND THROAT WITH OR WITHOUT PRESENCE OF ULCERS  *URINARY PROBLEMS  *BOWEL PROBLEMS  UNUSUAL RASH Items with * indicate a potential emergency and should be followed up as soon as possible.   Feel free to call the clinic you have any questions or concerns. The clinic phone number is [phone].   I have been informed and understand all the instructions given to me. I know to contact the clinic, my physician, or go to the Emergency Department if any problems should occur. I do not have any questions at this time, but understand that I may call the clinic during office hours   should I have any questions or need assistance in obtaining follow up care.    __________________________________________  _____________  __________ Signature of Patient or Authorized Representative            Date                   Time    __________________________________________ Nurse's Signature

## 2012-06-06 NOTE — Progress Notes (Signed)
[facility] Telephone:(336) [phone]   Fax:(336) [phone]  OFFICE PROGRESS NOTE  [name], MD [address] [place] [address]  DIAGNOSIS: Metastatic non-small cell lung cancer adenocarcinoma diagnosed in September 2010   PRIOR THERAPY:  #1 status post 6 cycles of systemic chemotherapy with carboplatin, Alimta and Avastin given every 3 weeks last dose given 04/23/2009 with disease stabilization.  #2 status post palliative radiotherapy to the mediastinum under the care of Dr. Michell Heinrich. The patient received a total dose of 3000 cGY and 12 fractions completed 01/21/2009.  #3 Maintenance chemotherapy with Alimta at 500 mg per meter squared and Avastin at 15 mg per kilogram given every 3 weeks status post 35 cycles.   CURRENT THERAPY: Maintenance chemotherapy with Alimta 500 mg/M2 and Avastin 15 mg/kg every 4 weeks,status post 12 cycles.   INTERVAL HISTORY: [name] 67 y.o. male returns to the clinic today for followup visit accompanied by his wife. The patient is feeling fine today with no specific complaints except for mild puffiness of his eyes. He denied having any significant chest pain, shortness breath, cough or hemoptysis. He denied having any significant weight loss or night sweats. The patient denied having any significant nausea or vomiting, no fever or chills. He is rating his chemotherapy with maintenance Alimta fairly well. He has repeat CT scan of the chest, abdomen and pelvis performed recently and he is here for evaluation and discussion of his scan results.  MEDICAL HISTORY: Past Medical History  Diagnosis Date  . Hypertension   . Arthritis RIGHT SHOULDER  . Immature cataract BILATERAL  . Non-small cell lung cancer DX SEPT 2010  W/ CHEMORADIATION AT THAT TIME -- NOW  W/ METS--  CURRENTLY ON MAINTENANCE CHEMO TX  EVERY 30 DAYS    ONCOLOGIST- DR [facility]  . Acute meniscal tear of knee RIGHT KNEE  . BPH (benign prostatic  hypertrophy)     ALLERGIES:  is allergic to percocet and zolpidem tartrate.  MEDICATIONS:  Current Outpatient Prescriptions  Medication Sig Dispense Refill  . amLODipine (NORVASC) 2.5 MG tablet Take 2.5 mg by mouth every morning.       . Ascorbic Acid (VITAMIN C) 1000 MG tablet Take 1,000 mg by mouth daily.      Marland Kitchen atenolol (TENORMIN) 100 MG tablet Take 100 mg by mouth daily.      . B Complex-Biotin-FA (B-COMPLEX PO) Take 1 tablet by mouth daily. 1500mg  daily      . doxazosin (CARDURA) 2 MG tablet Take 2 mg by mouth at bedtime.       . FeFum-FePoly-FA-B Cmp-C-Biot (INTEGRA PLUS) CAPS Take 1 capsule by mouth daily.      . fexofenadine (ALLEGRA) 180 MG tablet Take 180 mg by mouth daily as needed.       . folic acid (FOLVITE) 1 MG tablet Take 1 tablet (1 mg total) by mouth daily.  90 tablet  3  . HYDROcodone-homatropine (HYCODAN) 5-1.5 MG/5ML syrup Take 5 mLs by mouth every 6 (six) hours as needed.  120 mL  0  . Multiple Vitamin (MULTIVITAMIN) tablet Take 1 tablet by mouth daily.       Marland Kitchen omeprazole (PRILOSEC) 40 MG capsule Take 1 capsule (40 mg total) by mouth every evening.  90 capsule  3  . Sennosides-Docusate Sodium (SENOKOT S PO) Take 2 tablets by mouth daily. 2 tabs daily      . dexamethasone (DECADRON) 4 MG tablet Take 1 tablet (4 mg  total) by mouth 2 (two) times daily with a meal. 1 tablet twice a day, the day before, day of and day after chemo  50 tablet  1   No current facility-administered medications for this visit.    SURGICAL HISTORY:  Past Surgical History  Procedure Laterality Date  . 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 Rotator cuff repair  2008    LEFT SHOULDER  . Bilateral ear drum surgery  1960'S  . Orif left ring finger  and revascularization of radial side  02-16-2007    DEGLOVING INJURY  . Knee arthroscopy  12/21/2011    Procedure: ARTHROSCOPY KNEE;  Surgeon: Jacki Cones, MD;  Location:  [facility];  Service: Orthopedics;  Laterality: Right;  WITH MEDIAL MENISECTOMY    REVIEW OF SYSTEMS:  A comprehensive review of systems was negative.   PHYSICAL EXAMINATION: General appearance: alert, cooperative and no distress Head: Normocephalic, without obvious abnormality, atraumatic Neck: no adenopathy Lymph nodes: Cervical, supraclavicular, and axillary nodes normal. Resp: clear to auscultation bilaterally Cardio: regular rate and rhythm, S1, S2 normal, no murmur, click, rub or gallop GI: soft, non-tender; bowel sounds normal; no masses,  no organomegaly Extremities: extremities normal, atraumatic, no cyanosis or edema Neurologic: Alert and oriented X 3, normal strength and tone. Normal symmetric reflexes. Normal coordination and gait  ECOG PERFORMANCE STATUS: 1 - Symptomatic but completely ambulatory  Blood pressure 151/89, pulse 62, temperature 97.4 F (36.3 C), temperature source Oral, resp. rate 18, height 5\' 7"  (1.702 m), weight 145 lb 1.6 oz (65.817 kg).  LABORATORY DATA: Lab Results  Component Value Date   WBC 6.7 06/06/2012   HGB 12.8* 06/06/2012   HCT 38.7 06/06/2012   MCV 102.7* 06/06/2012   PLT 155 06/06/2012      Chemistry      Component Value Date/Time   NA 140 05/09/2012 0923   NA 140 12/21/2011 1252   K 4.1 05/09/2012 0923   K 3.3* 12/21/2011 1252   CL 106 05/09/2012 0923   CL 101 11/18/2011 0858   CO2 28 05/09/2012 0923   CO2 22 11/18/2011 0858   BUN 18.3 05/09/2012 0923   BUN 19 11/18/2011 0858   CREATININE 1.2 05/09/2012 0923   CREATININE 1.34 11/18/2011 0858      Component Value Date/Time   CALCIUM 10.0 05/09/2012 0923   CALCIUM 9.5 11/18/2011 0858   ALKPHOS 78 05/09/2012 0923   ALKPHOS 55 11/18/2011 0858   AST 26 05/09/2012 0923   AST 25 11/18/2011 0858   ALT 19 05/09/2012 0923   ALT 17 11/18/2011 0858   BILITOT 0.59 05/09/2012 0923   BILITOT 0.5 11/18/2011 0858       RADIOGRAPHIC STUDIES: Ct Chest W Contrast  06/02/2012  *RADIOLOGY  REPORT*  Clinical Data:  Lung cancer.  Restaging.  CT CHEST, ABDOMEN AND PELVIS WITH CONTRAST  Technique:  Multidetector CT imaging of the chest, abdomen and pelvis was performed following the standard protocol during bolus administration of intravenous contrast.  Contrast: OMNIPAQUE IOHEXOL 300 MG/ML  SOLN  Comparison:  03/09/2012  CT CHEST  Findings:  The tip of the right-sided Port-A-Cath is positioned at the junction of the SVC and RA.  There is no axillary lymphadenopathy.  Scattered small mediastinal lymph nodes are again noted and are stable in size.  A right paratracheal lymph node measures 8 mm in short axis, upper normal.  No hilar  lymphadenopathy.  Heart size is normal.  Trace pericardial fluid or thickening is noted inferiorly.  Post radiation fibrosis is seen in the medial right upper lung.  No new or progressive lung disease on the right.  Left lung is also stable.  Tiny posterior left apical subpleural nodule is unchanged.  Bone windows reveal no worrisome lytic or sclerotic osseous lesions.  IMPRESSION: Stable exam.  No new or progressive disease.  CT ABDOMEN AND PELVIS  Findings:  No focal abnormalities seen in the liver or spleen.  The stomach, duodenum, pancreas, gallbladder, and adrenal glands are normal.  There is some persistent subtle bilateral perinephric stranding which is stable and can be a chronic finding.  No change in the left-sided small renal cyst.  Atherosclerotic calcification noted in the wall of the abdominal aorta which measures up to 3.0 cm in diameter, consistent with aneurysm.  1.7 cm short-axis aorto caval lymph node has increased in size.  9 mm short-axis aorto caval lymph node seen on image 77 has also increased in size.  This size progression is most noticeable when comparing back to a more remote study of 04/30/2011.  Imaging through the pelvis shows no free intraperitoneal fluid. There is no pelvic sidewall lymphadenopathy.  The bladder is normal in appearance.  No  substantial diverticular change in the colon. No colonic diverticulitis.  The terminal ileum is normal. The appendix is not visualized, but there is no edema or inflammation in the region of the cecum.  Bone windows reveal no worrisome lytic or sclerotic osseous lesions.  IMPRESSION: Mild retroperitoneal lymphadenopathy now identified.  Comparing back over previous studies back to 04/30/2011 provides the best demonstration of these slowly progressive lymph nodes of which one now meets CT criteria for pathologic enlargement.  Given the continued progression, metastatic disease is a concern.  PET CT may prove helpful to further evaluate.   Original Report Authenticated By: Kennith Center, M.D.    Ct Abdomen Pelvis W Contrast  06/02/2012  *RADIOLOGY REPORT*  Clinical Data:  Lung cancer.  Restaging.  CT CHEST, ABDOMEN AND PELVIS WITH CONTRAST  Technique:  Multidetector CT imaging of the chest, abdomen and pelvis was performed following the standard protocol during bolus administration of intravenous contrast.  Contrast: OMNIPAQUE IOHEXOL 300 MG/ML  SOLN  Comparison:  03/09/2012   CT CHEST  Findings:  The tip of the right-sided Port-A-Cath is positioned at the junction of the SVC and RA.  There is no axillary lymphadenopathy.  Scattered small mediastinal lymph nodes are again noted and are stable in size.  A right paratracheal lymph node measures 8 mm in short axis, upper normal.  No hilar lymphadenopathy.  Heart size is normal.  Trace pericardial fluid or thickening is noted inferiorly.  Post radiation fibrosis is seen in the medial right upper lung.  No new or progressive lung disease on the right.  Left lung is also stable.  Tiny posterior left apical subpleural nodule is unchanged.  Bone windows reveal no worrisome lytic or sclerotic osseous lesions.  IMPRESSION: Stable exam.  No new or progressive disease.   CT ABDOMEN AND PELVIS  Findings:  No focal abnormalities seen in the liver or spleen.  The stomach,  duodenum, pancreas, gallbladder, and adrenal glands are normal.  There is some persistent subtle bilateral perinephric stranding which is stable and can be a chronic finding.  No change in the left-sided small renal cyst.  Atherosclerotic calcification noted in the wall of the abdominal aorta which measures up  to 3.0 cm in diameter, consistent with aneurysm.  1.7 cm short-axis aorto caval lymph node has increased in size.  9 mm short-axis aorto caval lymph node seen on image 77 has also increased in size.  This size progression is most noticeable when comparing back to a more remote study of 04/30/2011.  Imaging through the pelvis shows no free intraperitoneal fluid. There is no pelvic sidewall lymphadenopathy.  The bladder is normal in appearance.  No substantial diverticular change in the colon. No colonic diverticulitis.  The terminal ileum is normal. The appendix is not visualized, but there is no edema or inflammation in the region of the cecum.  Bone windows reveal no worrisome lytic or sclerotic osseous lesions.  IMPRESSION: Mild retroperitoneal lymphadenopathy now identified.  Comparing back over previous studies back to 04/30/2011 provides the best demonstration of these slowly progressive lymph nodes of which one now meets CT criteria for pathologic enlargement.  Given the continued progression, metastatic disease is a concern.  PET CT may prove helpful to further evaluate.   Original Report Authenticated By: Kennith Center, M.D.     ASSESSMENT: This is a very pleasant 67 years old white male with metastatic non-small cell lung cancer, adenocarcinoma currently on maintenance chemotherapy with Alimta 500 mg/M2 and Avastin 15 mg/kg every 4 weeks. The patient is doing fine and tolerating his treatment fairly well. CT scan of the chest, abdomen and pelvis showed no significant evidence for disease progression except for slowly growing abdominal lymph nodes compared to a the scan performed a year  ago.   PLAN: I discussed the scan results with the patient and his wife. I recommended for him to continue on maintenance treatment with Alimta and Avastin as scheduled. We'll continue to monitor the abdominal lymph nodes on the upcoming scan closely.  He was advised to call immediately if he has any concerning symptoms in the interval. His puffiness of the eye could be related to some of his blood pressure medication and advise him to discuss with his primary care physician to see if he change is needed. He was advised to call me immediately if he has any concerning symptoms in the interval.  All questions were answered. The patient knows to call the clinic with any problems, questions or concerns. We can certainly see the patient much sooner if necessary.  I spent 15 minutes counseling the patient face to face. The total time spent in the appointment was 25 minutes.

## 2012-06-06 NOTE — Patient Instructions (Signed)
No evidence for disease progression except for slowly increased size of abdominal lymph node. Continue maintenance chemotherapy with Alimta as scheduled. Followup in 4 weeks

## 2012-06-06 NOTE — Progress Notes (Signed)
Cath flo indewt at 1025, at 15 min and 30 min no blood return, PIV started for treatment. At 1230 10cc blood return discarded. Port flushed and deaccessed.

## 2012-06-06 NOTE — Telephone Encounter (Signed)
gv and printed appt schedule for pt for April...printed paper for tx for Melissa...pt aware

## 2012-07-04 ENCOUNTER — Other Ambulatory Visit (HOSPITAL_BASED_OUTPATIENT_CLINIC_OR_DEPARTMENT_OTHER): Payer: Medicare Other | Admitting: Lab

## 2012-07-04 ENCOUNTER — Telehealth: Payer: Self-pay | Admitting: Internal Medicine

## 2012-07-04 ENCOUNTER — Ambulatory Visit (HOSPITAL_BASED_OUTPATIENT_CLINIC_OR_DEPARTMENT_OTHER): Payer: Medicare Other

## 2012-07-04 ENCOUNTER — Encounter: Payer: Self-pay | Admitting: Physician Assistant

## 2012-07-04 ENCOUNTER — Ambulatory Visit (HOSPITAL_BASED_OUTPATIENT_CLINIC_OR_DEPARTMENT_OTHER): Payer: Medicare Other | Admitting: Physician Assistant

## 2012-07-04 DIAGNOSIS — R599 Enlarged lymph nodes, unspecified: Secondary | ICD-10-CM

## 2012-07-04 DIAGNOSIS — Z5112 Encounter for antineoplastic immunotherapy: Secondary | ICD-10-CM

## 2012-07-04 DIAGNOSIS — C3410 Malignant neoplasm of upper lobe, unspecified bronchus or lung: Secondary | ICD-10-CM

## 2012-07-04 DIAGNOSIS — C7A090 Malignant carcinoid tumor of the bronchus and lung: Secondary | ICD-10-CM

## 2012-07-04 DIAGNOSIS — R7309 Other abnormal glucose: Secondary | ICD-10-CM

## 2012-07-04 DIAGNOSIS — Z5111 Encounter for antineoplastic chemotherapy: Secondary | ICD-10-CM

## 2012-07-04 LAB — COMPREHENSIVE METABOLIC PANEL (CC13)
Albumin: 3.3 g/dL — ABNORMAL LOW (ref 3.5–5.0)
BUN: 18.8 mg/dL (ref 7.0–26.0)
CO2: 23 mEq/L (ref 22–29)
Calcium: 9.7 mg/dL (ref 8.4–10.4)
Chloride: 109 mEq/L — ABNORMAL HIGH (ref 98–107)
Glucose: 94 mg/dl (ref 70–99)
Potassium: 4.6 mEq/L (ref 3.5–5.1)

## 2012-07-04 LAB — CBC WITH DIFFERENTIAL/PLATELET
Basophils Absolute: 0.1 10*3/uL (ref 0.0–0.1)
EOS%: 2.6 % (ref 0.0–7.0)
HGB: 12 g/dL — ABNORMAL LOW (ref 13.0–17.1)
MCH: 34.2 pg — ABNORMAL HIGH (ref 27.2–33.4)
MCV: 103.1 fL — ABNORMAL HIGH (ref 79.3–98.0)
MONO%: 12.3 % (ref 0.0–14.0)
NEUT#: 3.8 10*3/uL (ref 1.5–6.5)
RBC: 3.51 10*6/uL — ABNORMAL LOW (ref 4.20–5.82)
RDW: 15.5 % — ABNORMAL HIGH (ref 11.0–14.6)
lymph#: 1.7 10*3/uL (ref 0.9–3.3)
nRBC: 0 % (ref 0–0)

## 2012-07-04 MED ORDER — DEXAMETHASONE SODIUM PHOSPHATE 10 MG/ML IJ SOLN
10.0000 mg | Freq: Once | INTRAMUSCULAR | Status: AC
  Administered 2012-07-04: 10 mg via INTRAVENOUS

## 2012-07-04 MED ORDER — ONDANSETRON 8 MG/50ML IVPB (CHCC)
8.0000 mg | Freq: Once | INTRAVENOUS | Status: AC
  Administered 2012-07-04: 8 mg via INTRAVENOUS

## 2012-07-04 MED ORDER — SODIUM CHLORIDE 0.9 % IJ SOLN
10.0000 mL | INTRAMUSCULAR | Status: DC | PRN
  Administered 2012-07-04: 10 mL
  Filled 2012-07-04: qty 10

## 2012-07-04 MED ORDER — HEPARIN SOD (PORK) LOCK FLUSH 100 UNIT/ML IV SOLN
500.0000 [IU] | Freq: Once | INTRAVENOUS | Status: AC | PRN
  Administered 2012-07-04: 500 [IU]
  Filled 2012-07-04: qty 5

## 2012-07-04 MED ORDER — SODIUM CHLORIDE 0.9 % IV SOLN
15.0000 mg/kg | Freq: Once | INTRAVENOUS | Status: AC
  Administered 2012-07-04: 975 mg via INTRAVENOUS
  Filled 2012-07-04: qty 39

## 2012-07-04 MED ORDER — SODIUM CHLORIDE 0.9 % IV SOLN
Freq: Once | INTRAVENOUS | Status: AC
  Administered 2012-07-04: 10:00:00 via INTRAVENOUS

## 2012-07-04 MED ORDER — SODIUM CHLORIDE 0.9 % IV SOLN
500.0000 mg/m2 | Freq: Once | INTRAVENOUS | Status: AC
  Administered 2012-07-04: 875 mg via INTRAVENOUS
  Filled 2012-07-04: qty 35

## 2012-07-04 NOTE — Telephone Encounter (Signed)
GAve pt appt for lab, Ml and chemo for MAy 2014

## 2012-07-04 NOTE — Patient Instructions (Addendum)
Follow up in 1 month prior to your next scheduled cycle of maintenance chemotherapy

## 2012-07-04 NOTE — Progress Notes (Signed)
OK TO TREAT WITH AVASTIN TODAY AND URINE PROTEIN OF 100 ,

## 2012-07-04 NOTE — Patient Instructions (Addendum)
[facility] Discharge Instructions for Patients Receiving Chemotherapy/biotherapy  Today you received the following chemotherapy agents -Alimta and Avastin To help prevent nausea and vomiting after your treatment, we encourage you to take your nausea medication   Take it as often as prescribed.   If you develop nausea and vomiting that is not controlled by your nausea medication, call the clinic. If it is after clinic hours your family physician or the after hours number for the clinic or go to the Emergency Department.   BELOW ARE SYMPTOMS THAT SHOULD BE REPORTED IMMEDIATELY:  *FEVER GREATER THAN 100.5 F  *CHILLS WITH OR WITHOUT FEVER  NAUSEA AND VOMITING THAT IS NOT CONTROLLED WITH YOUR NAUSEA MEDICATION  *UNUSUAL SHORTNESS OF BREATH  *UNUSUAL BRUISING OR BLEEDING  TENDERNESS IN MOUTH AND THROAT WITH OR WITHOUT PRESENCE OF ULCERS  *URINARY PROBLEMS  *BOWEL PROBLEMS  UNUSUAL RASH Items with * indicate a potential emergency and should be followed up as soon as possible.  Feel free to call the clinic you have any questions or concerns. The clinic phone number is [phone].   I have been informed and understand all the instructions given to me. I know to contact the clinic, my physician, or go to the Emergency Department if any problems should occur. I do not have any questions at this time, but understand that I may call the clinic during office hours   should I have any questions or need assistance in obtaining follow up care.    __________________________________________  _____________  __________ Signature of Patient or Authorized Representative            Date                   Time    __________________________________________ Nurse's Signature

## 2012-07-05 ENCOUNTER — Telehealth: Payer: Self-pay | Admitting: Internal Medicine

## 2012-07-05 ENCOUNTER — Telehealth: Payer: Self-pay | Admitting: *Deleted

## 2012-07-05 NOTE — Telephone Encounter (Signed)
Called pt and left message regrding appt for lab, ML and chemo for May and June  2014

## 2012-07-05 NOTE — Telephone Encounter (Signed)
Per staff message I have adjusted 5/6 appts.  JMW

## 2012-07-08 NOTE — Progress Notes (Signed)
[facility] [phone]   [phone]  OFFICE PROGRESS NOTE  [name], MD [address] [place] [address]  DIAGNOSIS: Metastatic non-small cell lung cancer adenocarcinoma diagnosed in September 2010   PRIOR THERAPY:  #1 status post 6 cycles of systemic chemotherapy with carboplatin, Alimta and Avastin given every 3 weeks last dose given 04/23/2009 with disease stabilization.  #2 status post palliative radiotherapy to the mediastinum under the care of Dr. Michell Heinrich. The patient received a total dose of 3000 cGY and 12 fractions completed 01/21/2009.  #3 Maintenance chemotherapy with Alimta at 500 mg per meter squared and Avastin at 15 mg per kilogram given every 3 weeks status post 35 cycles.   CURRENT THERAPY: Maintenance chemotherapy with Alimta 500 mg/M2 and Avastin 15 mg/kg every 4 weeks,status post 13 cycles.   INTERVAL HISTORY: Timmy Bubeck 67 y.o. male returns to the clinic today for followup visit.  The patient is feeling fine today with no specific complaints.  He denied having any significant chest pain, shortness breath, cough or hemoptysis. He denied having any significant weight loss or night sweats. The patient denied having any significant nausea or vomiting, no fever or chills. He is rating his chemotherapy with maintenance Alimta fairly well. He expresses some concerns regarding some elevations in his glucose readings. He does admit to drinking 1 or 2 beers with some regularity. He will follow up with his primary care physician regarding this matter.  MEDICAL HISTORY: Past Medical History  Diagnosis Date  . Hypertension   . Arthritis RIGHT SHOULDER  . Immature cataract BILATERAL  . Non-small cell lung cancer DX SEPT 2010  W/ CHEMORADIATION AT THAT TIME -- NOW  W/ METS--  CURRENTLY ON MAINTENANCE CHEMO TX  EVERY 30 DAYS    ONCOLOGIST- [facility]  . Acute meniscal tear of knee RIGHT KNEE  . BPH (benign  prostatic hypertrophy)     ALLERGIES:  is allergic to percocet and zolpidem tartrate.  MEDICATIONS:  Current Outpatient Prescriptions  Medication Sig Dispense Refill  . amLODipine (NORVASC) 2.5 MG tablet Take 2.5 mg by mouth every morning.       . Ascorbic Acid (VITAMIN C) 1000 MG tablet Take 1,000 mg by mouth daily.      Marland Kitchen atenolol (TENORMIN) 100 MG tablet Take 100 mg by mouth daily.      . B Complex-Biotin-FA (B-COMPLEX PO) Take 1 tablet by mouth daily. 1500mg  daily      . dexamethasone (DECADRON) 4 MG tablet Take 1 tablet (4 mg total) by mouth 2 (two) times daily with a meal. 1 tablet twice a day, the day before, day of and day after chemo  50 tablet  1  . doxazosin (CARDURA) 2 MG tablet Take 2 mg by mouth at bedtime.       . FeFum-FePoly-FA-B Cmp-C-Biot (INTEGRA PLUS) CAPS Take 1 capsule by mouth daily.      . fexofenadine (ALLEGRA) 180 MG tablet Take 180 mg by mouth daily as needed.       . folic acid (FOLVITE) 1 MG tablet Take 1 tablet (1 mg total) by mouth daily.  90 tablet  3  . HYDROcodone-homatropine (HYCODAN) 5-1.5 MG/5ML syrup Take 5 mLs by mouth every 6 (six) hours as needed.  120 mL  0  . Multiple Vitamin (MULTIVITAMIN) tablet Take 1 tablet by mouth daily.       Marland Kitchen omeprazole (PRILOSEC) 40 MG capsule Take 1 capsule (40 mg  total) by mouth every evening.  90 capsule  3  . Sennosides-Docusate Sodium (SENOKOT S PO) Take 2 tablets by mouth daily. 2 tabs daily       No current facility-administered medications for this visit.    SURGICAL HISTORY:  Past Surgical History  Procedure Laterality Date  . 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 Rotator cuff repair  2008    LEFT SHOULDER  . Bilateral ear drum surgery  1960'S  . Orif left ring finger  and revascularization of radial side  02-16-2007    DEGLOVING INJURY  . Knee arthroscopy  12/21/2011    Procedure: ARTHROSCOPY KNEE;  Surgeon: Jacki Cones, MD;   Location: [facility];  Service: Orthopedics;  Laterality: Right;  WITH MEDIAL MENISECTOMY    REVIEW OF SYSTEMS:  A comprehensive review of systems was negative.   PHYSICAL EXAMINATION: General appearance: alert, cooperative and no distress Head: Normocephalic, without obvious abnormality, atraumatic Neck: no adenopathy Lymph nodes: Cervical, supraclavicular, and axillary nodes normal. Resp: clear to auscultation bilaterally Cardio: regular rate and rhythm, S1, S2 normal, no murmur, click, rub or gallop GI: soft, non-tender; bowel sounds normal; no masses,  no organomegaly Extremities: extremities normal, atraumatic, no cyanosis or edema Neurologic: Alert and oriented X 3, normal strength and tone. Normal symmetric reflexes. Normal coordination and gait  ECOG PERFORMANCE STATUS: 1 - Symptomatic but completely ambulatory  Blood pressure 154/87, pulse 66, temperature 97 F (36.1 C), temperature source Oral, resp. rate 20, height 5\' 7"  (1.702 m), weight 147 lb 4.8 oz (66.815 kg).  LABORATORY DATA: Lab Results  Component Value Date   WBC 6.5 07/04/2012   HGB 12.0* 07/04/2012   HCT 36.2* 07/04/2012   MCV 103.1* 07/04/2012   PLT 136* 07/04/2012      Chemistry      Component Value Date/Time   NA 140 07/04/2012 0908   NA 140 12/21/2011 1252   K 4.6 07/04/2012 0908   K 3.3* 12/21/2011 1252   CL 109* 07/04/2012 0908   CL 101 11/18/2011 0858   CO2 23 07/04/2012 0908   CO2 22 11/18/2011 0858   BUN 18.8 07/04/2012 0908   BUN 19 11/18/2011 0858   CREATININE 1.3 07/04/2012 0908   CREATININE 1.34 11/18/2011 0858      Component Value Date/Time   CALCIUM 9.7 07/04/2012 0908   CALCIUM 9.5 11/18/2011 0858   ALKPHOS 91 07/04/2012 0908   ALKPHOS 55 11/18/2011 0858   AST 29 07/04/2012 0908   AST 25 11/18/2011 0858   ALT 18 07/04/2012 0908   ALT 17 11/18/2011 0858   BILITOT 0.54 07/04/2012 0908   BILITOT 0.5 11/18/2011 0858       RADIOGRAPHIC STUDIES: Ct Chest W Contrast  06/02/2012  *RADIOLOGY REPORT*   Clinical Data:  Lung cancer.  Restaging.  CT CHEST, ABDOMEN AND PELVIS WITH CONTRAST  Technique:  Multidetector CT imaging of the chest, abdomen and pelvis was performed following the standard protocol during bolus administration of intravenous contrast.  Contrast: OMNIPAQUE IOHEXOL 300 MG/ML  SOLN  Comparison:  03/09/2012  CT CHEST  Findings:  The tip of the right-sided Port-A-Cath is positioned at the junction of the SVC and RA.  There is no axillary lymphadenopathy.  Scattered small mediastinal lymph nodes are again noted and are stable in size.  A right paratracheal lymph node measures 8 mm in short axis, upper normal.  No  hilar lymphadenopathy.  Heart size is normal.  Trace pericardial fluid or thickening is noted inferiorly.  Post radiation fibrosis is seen in the medial right upper lung.  No new or progressive lung disease on the right.  Left lung is also stable.  Tiny posterior left apical subpleural nodule is unchanged.  Bone windows reveal no worrisome lytic or sclerotic osseous lesions.  IMPRESSION: Stable exam.  No new or progressive disease.  CT ABDOMEN AND PELVIS  Findings:  No focal abnormalities seen in the liver or spleen.  The stomach, duodenum, pancreas, gallbladder, and adrenal glands are normal.  There is some persistent subtle bilateral perinephric stranding which is stable and can be a chronic finding.  No change in the left-sided small renal cyst.  Atherosclerotic calcification noted in the wall of the abdominal aorta which measures up to 3.0 cm in diameter, consistent with aneurysm.  1.7 cm short-axis aorto caval lymph node has increased in size.  9 mm short-axis aorto caval lymph node seen on image 77 has also increased in size.  This size progression is most noticeable when comparing back to a more remote study of 04/30/2011.  Imaging through the pelvis shows no free intraperitoneal fluid. There is no pelvic sidewall lymphadenopathy.  The bladder is normal in appearance.  No  substantial diverticular change in the colon. No colonic diverticulitis.  The terminal ileum is normal. The appendix is not visualized, but there is no edema or inflammation in the region of the cecum.  Bone windows reveal no worrisome lytic or sclerotic osseous lesions.  IMPRESSION: Mild retroperitoneal lymphadenopathy now identified.  Comparing back over previous studies back to 04/30/2011 provides the best demonstration of these slowly progressive lymph nodes of which one now meets CT criteria for pathologic enlargement.  Given the continued progression, metastatic disease is a concern.  PET CT may prove helpful to further evaluate.   Original Report Authenticated By: Kennith Center, M.D.    Ct Abdomen Pelvis W Contrast  06/02/2012  *RADIOLOGY REPORT*  Clinical Data:  Lung cancer.  Restaging.  CT CHEST, ABDOMEN AND PELVIS WITH CONTRAST  Technique:  Multidetector CT imaging of the chest, abdomen and pelvis was performed following the standard protocol during bolus administration of intravenous contrast.  Contrast: OMNIPAQUE IOHEXOL 300 MG/ML  SOLN  Comparison:  03/09/2012   CT CHEST  Findings:  The tip of the right-sided Port-A-Cath is positioned at the junction of the SVC and RA.  There is no axillary lymphadenopathy.  Scattered small mediastinal lymph nodes are again noted and are stable in size.  A right paratracheal lymph node measures 8 mm in short axis, upper normal.  No hilar lymphadenopathy.  Heart size is normal.  Trace pericardial fluid or thickening is noted inferiorly.  Post radiation fibrosis is seen in the medial right upper lung.  No new or progressive lung disease on the right.  Left lung is also stable.  Tiny posterior left apical subpleural nodule is unchanged.  Bone windows reveal no worrisome lytic or sclerotic osseous lesions.  IMPRESSION: Stable exam.  No new or progressive disease.   CT ABDOMEN AND PELVIS  Findings:  No focal abnormalities seen in the liver or spleen.  The stomach,  duodenum, pancreas, gallbladder, and adrenal glands are normal.  There is some persistent subtle bilateral perinephric stranding which is stable and can be a chronic finding.  No change in the left-sided small renal cyst.  Atherosclerotic calcification noted in the wall of the abdominal aorta which measures  up to 3.0 cm in diameter, consistent with aneurysm.  1.7 cm short-axis aorto caval lymph node has increased in size.  9 mm short-axis aorto caval lymph node seen on image 77 has also increased in size.  This size progression is most noticeable when comparing back to a more remote study of 04/30/2011.  Imaging through the pelvis shows no free intraperitoneal fluid. There is no pelvic sidewall lymphadenopathy.  The bladder is normal in appearance.  No substantial diverticular change in the colon. No colonic diverticulitis.  The terminal ileum is normal. The appendix is not visualized, but there is no edema or inflammation in the region of the cecum.  Bone windows reveal no worrisome lytic or sclerotic osseous lesions.  IMPRESSION: Mild retroperitoneal lymphadenopathy now identified.  Comparing back over previous studies back to 04/30/2011 provides the best demonstration of these slowly progressive lymph nodes of which one now meets CT criteria for pathologic enlargement.  Given the continued progression, metastatic disease is a concern.  PET CT may prove helpful to further evaluate.   Original Report Authenticated By: Kennith Center, M.D.     ASSESSMENT/PLAN: This is a very pleasant 67 years old white male with metastatic non-small cell lung cancer, adenocarcinoma currently on maintenance chemotherapy with Alimta 500 mg/M2 and Avastin 15 mg/kg every 4 weeks. The patient is doing fine and tolerating his treatment fairly well. CT scan of the chest, abdomen and pelvis showed no significant evidence for disease progression except for slowly growing abdominal lymph nodes compared to a the scan performed a year ago.  We'll continue to monitor the abdominal lymph nodes on the upcoming scan closely.  The patient was discussed with Dr. Arbutus Ped. He will continue on his maintenance chemotherapy with Alimta and Avastin given every 4 weeks. He will return in 1 month with a repeat CBC Differential, CMET and urine protein dipstick. He will follow up with his primary care provider regarding his blood sugar elevations.  Terez Montee E, PA-C   He was advised to call immediately if he has any concerning symptoms in the interval.  All questions were answered. The patient knows to call the clinic with any problems, questions or concerns. We can certainly see the patient much sooner if necessary.  I spent 20 minutes counseling the patient face to face. The total time spent in the appointment was 30 minutes.

## 2012-08-01 ENCOUNTER — Ambulatory Visit (HOSPITAL_BASED_OUTPATIENT_CLINIC_OR_DEPARTMENT_OTHER): Payer: Medicare Other

## 2012-08-01 ENCOUNTER — Telehealth: Payer: Self-pay | Admitting: Internal Medicine

## 2012-08-01 ENCOUNTER — Other Ambulatory Visit (HOSPITAL_BASED_OUTPATIENT_CLINIC_OR_DEPARTMENT_OTHER): Payer: Medicare Other | Admitting: Lab

## 2012-08-01 ENCOUNTER — Other Ambulatory Visit: Payer: Self-pay | Admitting: Medical Oncology

## 2012-08-01 ENCOUNTER — Ambulatory Visit (HOSPITAL_BASED_OUTPATIENT_CLINIC_OR_DEPARTMENT_OTHER): Payer: Medicare Other | Admitting: Physician Assistant

## 2012-08-01 DIAGNOSIS — Z5112 Encounter for antineoplastic immunotherapy: Secondary | ICD-10-CM

## 2012-08-01 DIAGNOSIS — Z5111 Encounter for antineoplastic chemotherapy: Secondary | ICD-10-CM

## 2012-08-01 DIAGNOSIS — C3410 Malignant neoplasm of upper lobe, unspecified bronchus or lung: Secondary | ICD-10-CM

## 2012-08-01 DIAGNOSIS — C3490 Malignant neoplasm of unspecified part of unspecified bronchus or lung: Secondary | ICD-10-CM

## 2012-08-01 LAB — COMPREHENSIVE METABOLIC PANEL (CC13)
AST: 26 U/L (ref 5–34)
Albumin: 3.4 g/dL — ABNORMAL LOW (ref 3.5–5.0)
BUN: 14.9 mg/dL (ref 7.0–26.0)
CO2: 23 mEq/L (ref 22–29)
Calcium: 9.4 mg/dL (ref 8.4–10.4)
Chloride: 109 mEq/L — ABNORMAL HIGH (ref 98–107)
Creatinine: 1.3 mg/dL (ref 0.7–1.3)
Glucose: 104 mg/dl — ABNORMAL HIGH (ref 70–99)
Potassium: 4.5 mEq/L (ref 3.5–5.1)

## 2012-08-01 LAB — CBC WITH DIFFERENTIAL/PLATELET
Basophils Absolute: 0.1 10*3/uL (ref 0.0–0.1)
Eosinophils Absolute: 0.1 10*3/uL (ref 0.0–0.5)
HCT: 37.4 % — ABNORMAL LOW (ref 38.4–49.9)
HGB: 12.4 g/dL — ABNORMAL LOW (ref 13.0–17.1)
LYMPH%: 26.5 % (ref 14.0–49.0)
MCV: 103.3 fL — ABNORMAL HIGH (ref 79.3–98.0)
MONO#: 0.8 10*3/uL (ref 0.1–0.9)
MONO%: 12.4 % (ref 0.0–14.0)
NEUT#: 3.6 10*3/uL (ref 1.5–6.5)
NEUT%: 58.7 % (ref 39.0–75.0)
Platelets: 140 10*3/uL (ref 140–400)
RBC: 3.62 10*6/uL — ABNORMAL LOW (ref 4.20–5.82)
WBC: 6.1 10*3/uL (ref 4.0–10.3)
nRBC: 0 % (ref 0–0)

## 2012-08-01 MED ORDER — SODIUM CHLORIDE 0.9 % IJ SOLN
10.0000 mL | INTRAMUSCULAR | Status: DC | PRN
  Administered 2012-08-01: 10 mL
  Filled 2012-08-01: qty 10

## 2012-08-01 MED ORDER — HEPARIN SOD (PORK) LOCK FLUSH 100 UNIT/ML IV SOLN
500.0000 [IU] | Freq: Once | INTRAVENOUS | Status: AC | PRN
  Administered 2012-08-01: 500 [IU]
  Filled 2012-08-01: qty 5

## 2012-08-01 MED ORDER — SODIUM CHLORIDE 0.9 % IV SOLN
15.0000 mg/kg | Freq: Once | INTRAVENOUS | Status: AC
  Administered 2012-08-01: 975 mg via INTRAVENOUS
  Filled 2012-08-01: qty 39

## 2012-08-01 MED ORDER — DEXAMETHASONE SODIUM PHOSPHATE 10 MG/ML IJ SOLN
10.0000 mg | Freq: Once | INTRAMUSCULAR | Status: AC
  Administered 2012-08-01: 10 mg via INTRAVENOUS

## 2012-08-01 MED ORDER — SODIUM CHLORIDE 0.9 % IV SOLN
Freq: Once | INTRAVENOUS | Status: AC
  Administered 2012-08-01: 10:00:00 via INTRAVENOUS

## 2012-08-01 MED ORDER — ONDANSETRON 8 MG/50ML IVPB (CHCC)
8.0000 mg | Freq: Once | INTRAVENOUS | Status: AC
  Administered 2012-08-01: 8 mg via INTRAVENOUS

## 2012-08-01 MED ORDER — CYANOCOBALAMIN 1000 MCG/ML IJ SOLN
1000.0000 ug | Freq: Once | INTRAMUSCULAR | Status: AC
  Administered 2012-08-01: 1000 ug via INTRAMUSCULAR

## 2012-08-01 MED ORDER — SODIUM CHLORIDE 0.9 % IV SOLN
500.0000 mg/m2 | Freq: Once | INTRAVENOUS | Status: AC
  Administered 2012-08-01: 875 mg via INTRAVENOUS
  Filled 2012-08-01: qty 35

## 2012-08-01 NOTE — Patient Instructions (Addendum)
[facility] Discharge Instructions for Patients Receiving Chemotherapy  Today you received the following chemotherapy agents;  Alimta and Avastin  To help prevent nausea and vomiting after your treatment, we encourage you to take your nausea medication as directed.   If you develop nausea and vomiting that is not controlled by your nausea medication, call the clinic. If it is after clinic hours your family physician or the after hours number for the clinic or go to the Emergency Department.   BELOW ARE SYMPTOMS THAT SHOULD BE REPORTED IMMEDIATELY:  *FEVER GREATER THAN 100.5 F  *CHILLS WITH OR WITHOUT FEVER  NAUSEA AND VOMITING THAT IS NOT CONTROLLED WITH YOUR NAUSEA MEDICATION  *UNUSUAL SHORTNESS OF BREATH  *UNUSUAL BRUISING OR BLEEDING  TENDERNESS IN MOUTH AND THROAT WITH OR WITHOUT PRESENCE OF ULCERS  *URINARY PROBLEMS  *BOWEL PROBLEMS  UNUSUAL RASH Items with * indicate a potential emergency and should be followed up as soon as possible.  Feel free to call the clinic you have any questions or concerns. The clinic phone number is [phone].   I have been informed and understand all the instructions given to me. I know to contact the clinic, my physician, or go to the Emergency Department if any problems should occur. I do not have any questions at this time, but understand that I may call the clinic during office hours   should I have any questions or need assistance in obtaining follow up care.    __________________________________________  _____________  __________ Signature of Patient or Authorized Representative            Date                   Time    __________________________________________ Nurse's Signature

## 2012-08-01 NOTE — Patient Instructions (Addendum)
Followup with Dr. Arbutus Ped in one month with a restaging CT scan of your chest, abdomen and pelvis to reevaluate your disease prior to your next scheduled cycle of maintenance Alimta and Avastin

## 2012-08-01 NOTE — Telephone Encounter (Signed)
gv and printed appt sched and avs for pt for June...emaield MB to add tx and move 6.2 after MD visit...pt aware

## 2012-08-01 NOTE — Progress Notes (Signed)
[facility] Telephone:([phone]) [phone]   Fax:([phone]) [phone]  OFFICE PROGRESS NOTE  [name], MD [address] [place] [address]  DIAGNOSIS: Metastatic non-small cell lung cancer adenocarcinoma diagnosed in September 2010   PRIOR THERAPY:  #1 status post 6 cycles of systemic chemotherapy with carboplatin, Alimta and Avastin given every 3 weeks last dose given 04/23/2009 with disease stabilization.  #2 status post palliative radiotherapy to the mediastinum under the care of Dr. Michell Heinrich. The patient received a total dose of 3000 cGY and 12 fractions completed 01/21/2009.  #3 Maintenance chemotherapy with Alimta at 500 mg per meter squared and Avastin at 15 mg per kilogram given every 3 weeks status post 35 cycles.   CURRENT THERAPY: Maintenance chemotherapy with Alimta 500 mg/M2 and Avastin 15 mg/kg every 4 weeks,status post 14 cycles.   INTERVAL HISTORY: [name] 67 y.o. male returns to the clinic today for followup visit accompanied by his wife.  The patient is feeling fine today with no specific complaints.  He denied having any significant chest pain, shortness breath, cough or hemoptysis. He denied having any significant weight loss or night sweats. The patient denied having any significant nausea or vomiting, no fever or chills. He is rating his chemotherapy with maintenance Alimta fairly well. He reports that he is able to do a fair amount of yard work although he does have to take breaks due to the shortness of breath with exertion. He is also able to play some golf.  MEDICAL HISTORY: Past Medical History  Diagnosis Date  . Hypertension   . Arthritis RIGHT SHOULDER  . Immature cataract BILATERAL  . Non-small cell lung cancer DX SEPT 2010  W/ CHEMORADIATION AT THAT TIME -- NOW  W/ METS--  CURRENTLY ON MAINTENANCE CHEMO TX  EVERY 30 DAYS    ONCOLOGIST- DR [facility]  . Acute meniscal tear of knee RIGHT KNEE  . BPH (benign prostatic  hypertrophy)     ALLERGIES:  is allergic to percocet and zolpidem tartrate.  MEDICATIONS:  Current Outpatient Prescriptions  Medication Sig Dispense Refill  . amLODipine (NORVASC) 2.5 MG tablet Take 2.5 mg by mouth every morning.       . Ascorbic Acid (VITAMIN C) 1000 MG tablet Take 1,000 mg by mouth daily.      Marland Kitchen atenolol (TENORMIN) 100 MG tablet Take 100 mg by mouth daily.      . B Complex-Biotin-FA (B-COMPLEX PO) Take 1 tablet by mouth daily. 1500mg  daily      . dexamethasone (DECADRON) 4 MG tablet Take 1 tablet (4 mg total) by mouth 2 (two) times daily with a meal. 1 tablet twice a day, the day before, day of and day after chemo  50 tablet  1  . doxazosin (CARDURA) 2 MG tablet Take 2 mg by mouth at bedtime.       . FeFum-FePoly-FA-B Cmp-C-Biot (INTEGRA PLUS) CAPS Take 1 capsule by mouth daily.      . fexofenadine (ALLEGRA) 180 MG tablet Take 180 mg by mouth daily as needed.       . folic acid (FOLVITE) 1 MG tablet Take 1 tablet (1 mg total) by mouth daily.  90 tablet  3  . HYDROcodone-homatropine (HYCODAN) 5-1.5 MG/5ML syrup Take 5 mLs by mouth every 6 (six) hours as needed.  120 mL  0  . Multiple Vitamin (MULTIVITAMIN) tablet Take 1 tablet by mouth daily.       Marland Kitchen omeprazole (PRILOSEC) 40 MG  capsule Take 1 capsule (40 mg total) by mouth every evening.  90 capsule  3  . Sennosides-Docusate Sodium (SENOKOT S PO) Take 2 tablets by mouth daily. 2 tabs daily       No current facility-administered medications for this visit.    SURGICAL HISTORY:  Past Surgical History  Procedure Laterality Date  . 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 Rotator cuff repair  2008    LEFT SHOULDER  . Bilateral ear drum surgery  1960'S  . Orif left ring finger  and revascularization of radial side  02-16-2007    DEGLOVING INJURY  . Knee arthroscopy  12/21/2011    Procedure: ARTHROSCOPY KNEE;  Surgeon: Jacki Cones, MD;  Location:  [facility];  Service: Orthopedics;  Laterality: Right;  WITH MEDIAL MENISECTOMY    REVIEW OF SYSTEMS:  A comprehensive review of systems was negative.   PHYSICAL EXAMINATION: General appearance: alert, cooperative and no distress Head: Normocephalic, without obvious abnormality, atraumatic Neck: no adenopathy Lymph nodes: Cervical, supraclavicular, and axillary nodes normal. Resp: clear to auscultation bilaterally Cardio: regular rate and rhythm, S1, S2 normal, no murmur, click, rub or gallop GI: soft, non-tender; bowel sounds normal; no masses,  no organomegaly Extremities: extremities normal, atraumatic, no cyanosis or edema Neurologic: Alert and oriented X 3, normal strength and tone. Normal symmetric reflexes. Normal coordination and gait  ECOG PERFORMANCE STATUS: 1 - Symptomatic but completely ambulatory  Blood pressure 138/85, pulse 55, temperature 97.1 F (36.2 C), temperature source Oral, resp. rate 18, height 5\' 7"  (1.702 m), weight 145 lb 8 oz (65.998 kg).  LABORATORY DATA: Lab Results  Component Value Date   WBC 6.1 08/01/2012   HGB 12.4* 08/01/2012   HCT 37.4* 08/01/2012   MCV 103.3* 08/01/2012   PLT 140 08/01/2012      Chemistry      Component Value Date/Time   NA 140 07/04/2012 0908   NA 140 12/21/2011 1252   K 4.6 07/04/2012 0908   K 3.3* 12/21/2011 1252   CL 109* 07/04/2012 0908   CL 101 11/18/2011 0858   CO2 23 07/04/2012 0908   CO2 22 11/18/2011 0858   BUN 18.8 07/04/2012 0908   BUN 19 11/18/2011 0858   CREATININE 1.3 07/04/2012 0908   CREATININE 1.34 11/18/2011 0858      Component Value Date/Time   CALCIUM 9.7 07/04/2012 0908   CALCIUM 9.5 11/18/2011 0858   ALKPHOS 91 07/04/2012 0908   ALKPHOS 55 11/18/2011 0858   AST 29 07/04/2012 0908   AST 25 11/18/2011 0858   ALT 18 07/04/2012 0908   ALT 17 11/18/2011 0858   BILITOT 0.54 07/04/2012 0908   BILITOT 0.5 11/18/2011 0858       RADIOGRAPHIC STUDIES: Ct Chest W Contrast  06/02/2012  *RADIOLOGY REPORT*  Clinical  Data:  Lung cancer.  Restaging.  CT CHEST, ABDOMEN AND PELVIS WITH CONTRAST  Technique:  Multidetector CT imaging of the chest, abdomen and pelvis was performed following the standard protocol during bolus administration of intravenous contrast.  Contrast: OMNIPAQUE IOHEXOL 300 MG/ML  SOLN  Comparison:  03/09/2012  CT CHEST  Findings:  The tip of the right-sided Port-A-Cath is positioned at the junction of the SVC and RA.  There is no axillary lymphadenopathy.  Scattered small mediastinal lymph nodes are again noted and are stable in size.  A right paratracheal lymph node measures 8 mm in  short axis, upper normal.  No hilar lymphadenopathy.  Heart size is normal.  Trace pericardial fluid or thickening is noted inferiorly.  Post radiation fibrosis is seen in the medial right upper lung.  No new or progressive lung disease on the right.  Left lung is also stable.  Tiny posterior left apical subpleural nodule is unchanged.  Bone windows reveal no worrisome lytic or sclerotic osseous lesions.  IMPRESSION: Stable exam.  No new or progressive disease.  CT ABDOMEN AND PELVIS  Findings:  No focal abnormalities seen in the liver or spleen.  The stomach, duodenum, pancreas, gallbladder, and adrenal glands are normal.  There is some persistent subtle bilateral perinephric stranding which is stable and can be a chronic finding.  No change in the left-sided small renal cyst.  Atherosclerotic calcification noted in the wall of the abdominal aorta which measures up to 3.0 cm in diameter, consistent with aneurysm.  1.7 cm short-axis aorto caval lymph node has increased in size.  9 mm short-axis aorto caval lymph node seen on image 77 has also increased in size.  This size progression is most noticeable when comparing back to a more remote study of 04/30/2011.  Imaging through the pelvis shows no free intraperitoneal fluid. There is no pelvic sidewall lymphadenopathy.  The bladder is normal in appearance.  No substantial  diverticular change in the colon. No colonic diverticulitis.  The terminal ileum is normal. The appendix is not visualized, but there is no edema or inflammation in the region of the cecum.  Bone windows reveal no worrisome lytic or sclerotic osseous lesions.  IMPRESSION: Mild retroperitoneal lymphadenopathy now identified.  Comparing back over previous studies back to 04/30/2011 provides the best demonstration of these slowly progressive lymph nodes of which one now meets CT criteria for pathologic enlargement.  Given the continued progression, metastatic disease is a concern.  PET CT may prove helpful to further evaluate.   Original Report Authenticated By: Kennith Center, M.D.    Ct Abdomen Pelvis W Contrast  06/02/2012  *RADIOLOGY REPORT*  Clinical Data:  Lung cancer.  Restaging.  CT CHEST, ABDOMEN AND PELVIS WITH CONTRAST  Technique:  Multidetector CT imaging of the chest, abdomen and pelvis was performed following the standard protocol during bolus administration of intravenous contrast.  Contrast: OMNIPAQUE IOHEXOL 300 MG/ML  SOLN  Comparison:  03/09/2012   CT CHEST  Findings:  The tip of the right-sided Port-A-Cath is positioned at the junction of the SVC and RA.  There is no axillary lymphadenopathy.  Scattered small mediastinal lymph nodes are again noted and are stable in size.  A right paratracheal lymph node measures 8 mm in short axis, upper normal.  No hilar lymphadenopathy.  Heart size is normal.  Trace pericardial fluid or thickening is noted inferiorly.  Post radiation fibrosis is seen in the medial right upper lung.  No new or progressive lung disease on the right.  Left lung is also stable.  Tiny posterior left apical subpleural nodule is unchanged.  Bone windows reveal no worrisome lytic or sclerotic osseous lesions.  IMPRESSION: Stable exam.  No new or progressive disease.   CT ABDOMEN AND PELVIS  Findings:  No focal abnormalities seen in the liver or spleen.  The stomach, duodenum,  pancreas, gallbladder, and adrenal glands are normal.  There is some persistent subtle bilateral perinephric stranding which is stable and can be a chronic finding.  No change in the left-sided small renal cyst.  Atherosclerotic calcification noted in the wall  of the abdominal aorta which measures up to 3.0 cm in diameter, consistent with aneurysm.  1.7 cm short-axis aorto caval lymph node has increased in size.  9 mm short-axis aorto caval lymph node seen on image 77 has also increased in size.  This size progression is most noticeable when comparing back to a more remote study of 04/30/2011.  Imaging through the pelvis shows no free intraperitoneal fluid. There is no pelvic sidewall lymphadenopathy.  The bladder is normal in appearance.  No substantial diverticular change in the colon. No colonic diverticulitis.  The terminal ileum is normal. The appendix is not visualized, but there is no edema or inflammation in the region of the cecum.  Bone windows reveal no worrisome lytic or sclerotic osseous lesions.  IMPRESSION: Mild retroperitoneal lymphadenopathy now identified.  Comparing back over previous studies back to 04/30/2011 provides the best demonstration of these slowly progressive lymph nodes of which one now meets CT criteria for pathologic enlargement.  Given the continued progression, metastatic disease is a concern.  PET CT may prove helpful to further evaluate.   Original Report Authenticated By: Kennith Center, M.D.     ASSESSMENT/PLAN: This is a very pleasant 67 years old white male with metastatic non-small cell lung cancer, adenocarcinoma currently on maintenance chemotherapy with Alimta 500 mg/M2 and Avastin 15 mg/kg every 4 weeks. The patient is doing fine and tolerating his treatment fairly well. Last CT scan of the chest, abdomen and pelvis showed no significant evidence for disease progression except for slowly growing abdominal lymph nodes compared to a the scan performed a year ago. We'll  continue to monitor the abdominal lymph nodes on the upcoming scan closely.  The patient was discussed with Dr. Arbutus Ped. He will continue on his maintenance chemotherapy with Alimta and Avastin given every 4 weeks. He will followup with Dr. Arbutus Ped in 1 month with a repeat CBC Differential, CMET and urine protein dipstick as well as a restaging CT scan of the chest, abdomen and pelvis with contrast to reevaluate his disease.   Shereda Graw E, PA-C   He was advised to call immediately if he has any concerning symptoms in the interval.  All questions were answered. The patient knows to call the clinic with any problems, questions or concerns. We can certainly see the patient much sooner if necessary.  I spent 20 minutes counseling the patient face to face. The total time spent in the appointment was 30 minutes.

## 2012-08-25 ENCOUNTER — Ambulatory Visit (HOSPITAL_COMMUNITY)
Admission: RE | Admit: 2012-08-25 | Discharge: 2012-08-25 | Disposition: A | Discharge status: Home / Self Care | Payer: Medicare Other | Source: Ambulatory Visit | Attending: Physician Assistant | Admitting: Physician Assistant

## 2012-08-25 DIAGNOSIS — I700 Atherosclerosis of aorta: Secondary | ICD-10-CM | POA: Insufficient documentation

## 2012-08-25 DIAGNOSIS — C3490 Malignant neoplasm of unspecified part of unspecified bronchus or lung: Secondary | ICD-10-CM | POA: Insufficient documentation

## 2012-08-25 MED ORDER — IOHEXOL 300 MG/ML  SOLN
100.0000 mL | Freq: Once | INTRAMUSCULAR | Status: AC | PRN
  Administered 2012-08-25: 100 mL via INTRAVENOUS

## 2012-08-29 ENCOUNTER — Other Ambulatory Visit (HOSPITAL_BASED_OUTPATIENT_CLINIC_OR_DEPARTMENT_OTHER): Payer: Medicare Other | Admitting: Lab

## 2012-08-29 ENCOUNTER — Ambulatory Visit (HOSPITAL_BASED_OUTPATIENT_CLINIC_OR_DEPARTMENT_OTHER): Payer: Medicare Other

## 2012-08-29 ENCOUNTER — Telehealth: Payer: Self-pay | Admitting: Internal Medicine

## 2012-08-29 ENCOUNTER — Other Ambulatory Visit: Payer: Medicare Other | Admitting: Lab

## 2012-08-29 ENCOUNTER — Encounter: Payer: Self-pay | Admitting: Internal Medicine

## 2012-08-29 ENCOUNTER — Ambulatory Visit (HOSPITAL_BASED_OUTPATIENT_CLINIC_OR_DEPARTMENT_OTHER): Payer: Medicare Other | Admitting: Internal Medicine

## 2012-08-29 DIAGNOSIS — Z5112 Encounter for antineoplastic immunotherapy: Secondary | ICD-10-CM

## 2012-08-29 DIAGNOSIS — R5381 Other malaise: Secondary | ICD-10-CM

## 2012-08-29 DIAGNOSIS — R599 Enlarged lymph nodes, unspecified: Secondary | ICD-10-CM

## 2012-08-29 DIAGNOSIS — C3410 Malignant neoplasm of upper lobe, unspecified bronchus or lung: Secondary | ICD-10-CM

## 2012-08-29 DIAGNOSIS — C3490 Malignant neoplasm of unspecified part of unspecified bronchus or lung: Secondary | ICD-10-CM

## 2012-08-29 DIAGNOSIS — Z5111 Encounter for antineoplastic chemotherapy: Secondary | ICD-10-CM

## 2012-08-29 LAB — COMPREHENSIVE METABOLIC PANEL (CC13)
ALT: 17 U/L (ref 0–55)
AST: 27 U/L (ref 5–34)
Alkaline Phosphatase: 87 U/L (ref 40–150)
Creatinine: 1.3 mg/dL (ref 0.7–1.3)
Total Bilirubin: 0.76 mg/dL (ref 0.20–1.20)

## 2012-08-29 LAB — CBC WITH DIFFERENTIAL/PLATELET
EOS%: 1.6 % (ref 0.0–7.0)
Eosinophils Absolute: 0.1 10*3/uL (ref 0.0–0.5)
MCH: 34.2 pg — ABNORMAL HIGH (ref 27.2–33.4)
MCV: 101.7 fL — ABNORMAL HIGH (ref 79.3–98.0)
MONO%: 11.2 % (ref 0.0–14.0)
NEUT#: 4.2 10*3/uL (ref 1.5–6.5)
RBC: 3.57 10*6/uL — ABNORMAL LOW (ref 4.20–5.82)
RDW: 15.7 % — ABNORMAL HIGH (ref 11.0–14.6)
nRBC: 0 % (ref 0–0)

## 2012-08-29 LAB — UA PROTEIN, DIPSTICK - CHCC: Protein, ur: 30 mg/dL

## 2012-08-29 MED ORDER — SODIUM CHLORIDE 0.9 % IV SOLN
500.0000 mg/m2 | Freq: Once | INTRAVENOUS | Status: AC
  Administered 2012-08-29: 875 mg via INTRAVENOUS
  Filled 2012-08-29: qty 35

## 2012-08-29 MED ORDER — HEPARIN SOD (PORK) LOCK FLUSH 100 UNIT/ML IV SOLN
500.0000 [IU] | Freq: Once | INTRAVENOUS | Status: AC | PRN
  Administered 2012-08-29: 500 [IU]
  Filled 2012-08-29: qty 5

## 2012-08-29 MED ORDER — SODIUM CHLORIDE 0.9 % IV SOLN
Freq: Once | INTRAVENOUS | Status: AC
  Administered 2012-08-29: 11:00:00 via INTRAVENOUS

## 2012-08-29 MED ORDER — HYDROCODONE-HOMATROPINE 5-1.5 MG/5ML PO SYRP: 5.0000 mL | ORAL_SOLUTION | Freq: Four times a day (QID) | ORAL | Status: DC | PRN

## 2012-08-29 MED ORDER — SODIUM CHLORIDE 0.9 % IJ SOLN
10.0000 mL | INTRAMUSCULAR | Status: DC | PRN
  Administered 2012-08-29: 10 mL
  Filled 2012-08-29: qty 10

## 2012-08-29 MED ORDER — SODIUM CHLORIDE 0.9 % IV SOLN
15.0000 mg/kg | Freq: Once | INTRAVENOUS | Status: AC
  Administered 2012-08-29: 975 mg via INTRAVENOUS
  Filled 2012-08-29: qty 39

## 2012-08-29 MED ORDER — ONDANSETRON 8 MG/50ML IVPB (CHCC)
8.0000 mg | Freq: Once | INTRAVENOUS | Status: AC
  Administered 2012-08-29: 8 mg via INTRAVENOUS

## 2012-08-29 MED ORDER — DEXAMETHASONE SODIUM PHOSPHATE 10 MG/ML IJ SOLN
10.0000 mg | Freq: Once | INTRAMUSCULAR | Status: AC
  Administered 2012-08-29: 10 mg via INTRAVENOUS

## 2012-08-29 NOTE — Patient Instructions (Signed)
Your scan showed slowly enlarging abdominal lymph nodes. I will order a PET scan as well as MRI of the brain for restaging of her disease. Continue chemotherapy as scheduled. Followup visit in one month

## 2012-08-29 NOTE — Patient Instructions (Addendum)
[facility] Discharge Instructions for Patients Receiving Chemotherapy  Today you received the following chemotherapy agents: Avastin, Alimta  To help prevent nausea and vomiting after your treatment, we encourage you to take your nausea medication as directed by your MD.  If you develop nausea and vomiting that is not controlled by your nausea medication, call the clinic. If it is after clinic hours your family physician or the after hours number for the clinic or go to the Emergency Department.   BELOW ARE SYMPTOMS THAT SHOULD BE REPORTED IMMEDIATELY:  *FEVER GREATER THAN 100.5 F  *CHILLS WITH OR WITHOUT FEVER  NAUSEA AND VOMITING THAT IS NOT CONTROLLED WITH YOUR NAUSEA MEDICATION  *UNUSUAL SHORTNESS OF BREATH  *UNUSUAL BRUISING OR BLEEDING  TENDERNESS IN MOUTH AND THROAT WITH OR WITHOUT PRESENCE OF ULCERS  *URINARY PROBLEMS  *BOWEL PROBLEMS  UNUSUAL RASH Items with * indicate a potential emergency and should be followed up as soon as possible.   Feel free to call the clinic you have any questions or concerns. The clinic phone number is [phone].

## 2012-08-29 NOTE — Telephone Encounter (Signed)
gv and printed appt sched and avs for pt  °

## 2012-08-29 NOTE — Progress Notes (Signed)
[facility] [phone]   Fax:([phone]  OFFICE PROGRESS NOTE  [name], MD [address] [place] [address]  DIAGNOSIS: Metastatic non-small cell lung cancer adenocarcinoma diagnosed in September 2010   PRIOR THERAPY:  #1 status post 6 cycles of systemic chemotherapy with carboplatin, Alimta and Avastin given every 3 weeks last dose given 04/23/2009 with disease stabilization.  #2 status post palliative radiotherapy to the mediastinum under the care of Dr. Michell Heinrich. The patient received a total dose of 3000 cGY and 12 fractions completed 01/21/2009.  #3 Maintenance chemotherapy with Alimta at 500 mg per meter squared and Avastin at 15 mg per kilogram given every 3 weeks status post 35 cycles.   CURRENT THERAPY: Maintenance chemotherapy with Alimta 500 mg/M2 and Avastin 15 mg/kg every 4 weeks,status post 15 cycles.  INTERVAL HISTORY: Semisi Biela 67 y.o. male returns to the clinic today for followup visit accompanied his wife. The patient is feeling fine today with no specific complaints except for increasing fatigue and weakness as well as lack of energy. He is rating his systemic treatment with single agent Alimta and Avastin fairly well with no significant adverse effects. He denied having any significant fever or chills. He has no nausea or vomiting. He has no bleeding issues. The patient denied having any significant chest pain but continues to have shortness of breath with exertion, no cough or hemoptysis. He had repeat CT scan of the chest, abdomen and pelvis performed recently and he is here for evaluation and discussion of his scan results.  MEDICAL HISTORY: Past Medical History  Diagnosis Date  . Hypertension   . Arthritis RIGHT SHOULDER  . Immature cataract BILATERAL  . Non-small cell lung cancer DX SEPT 2010  W/ CHEMORADIATION AT THAT TIME -- NOW  W/ METS--  CURRENTLY ON MAINTENANCE CHEMO TX  EVERY 30 DAYS   ONCOLOGIST- DR [facility]  . Acute meniscal tear of knee RIGHT KNEE  . BPH (benign prostatic hypertrophy)     ALLERGIES:  is allergic to percocet and zolpidem tartrate.  MEDICATIONS:  Current Outpatient Prescriptions  Medication Sig Dispense Refill  . amLODipine (NORVASC) 2.5 MG tablet Take 2.5 mg by mouth every morning.       . Ascorbic Acid (VITAMIN C) 1000 MG tablet Take 1,000 mg by mouth daily.      Marland Kitchen atenolol (TENORMIN) 100 MG tablet Take 100 mg by mouth daily.      . B Complex-Biotin-FA (B-COMPLEX PO) Take 1 tablet by mouth daily. 1500mg  daily      . dexamethasone (DECADRON) 4 MG tablet Take 1 tablet (4 mg total) by mouth 2 (two) times daily with a meal. 1 tablet twice a day, the day before, day of and day after chemo  50 tablet  1  . doxazosin (CARDURA) 2 MG tablet Take 2 mg by mouth at bedtime.       . FeFum-FePoly-FA-B Cmp-C-Biot (INTEGRA PLUS) CAPS Take 1 capsule by mouth daily.      . fexofenadine (ALLEGRA) 180 MG tablet Take 180 mg by mouth daily as needed.       . folic acid (FOLVITE) 1 MG tablet Take 1 tablet (1 mg total) by mouth daily.  90 tablet  3  . HYDROcodone-homatropine (HYCODAN) 5-1.5 MG/5ML syrup Take 5 mLs by mouth every 6 (six) hours as needed.  120 mL  0  . Multiple Vitamin (MULTIVITAMIN) tablet Take 1 tablet by mouth daily.       Marland Kitchen  omeprazole (PRILOSEC) 40 MG capsule Take 1 capsule (40 mg total) by mouth every evening.  90 capsule  3  . Sennosides-Docusate Sodium (SENOKOT S PO) Take 2 tablets by mouth daily. 2 tabs daily       No current facility-administered medications for this visit.    SURGICAL HISTORY:  Past Surgical History  Procedure Laterality Date  . 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 Rotator cuff repair  2008    LEFT SHOULDER  . Bilateral ear drum surgery  1960'S  . Orif left ring finger  and revascularization of radial side  02-16-2007    DEGLOVING INJURY  . Knee  arthroscopy  12/21/2011    Procedure: ARTHROSCOPY KNEE;  Surgeon: Jacki Cones, MD;  Location: [facility];  Service: Orthopedics;  Laterality: Right;  WITH MEDIAL MENISECTOMY    REVIEW OF SYSTEMS:  A comprehensive review of systems was negative except for: Constitutional: positive for fatigue Respiratory: positive for dyspnea on exertion   PHYSICAL EXAMINATION: General appearance: alert, cooperative, fatigued and no distress Head: Normocephalic, without obvious abnormality, atraumatic Neck: no adenopathy Lymph nodes: Cervical, supraclavicular, and axillary nodes normal. Resp: clear to auscultation bilaterally Cardio: regular rate and rhythm, S1, S2 normal, no murmur, click, rub or gallop GI: soft, non-tender; bowel sounds normal; no masses,  no organomegaly Extremities: extremities normal, atraumatic, no cyanosis or edema Neurologic: Alert and oriented X 3, normal strength and tone. Normal symmetric reflexes. Normal coordination and gait  ECOG PERFORMANCE STATUS: 1 - Symptomatic but completely ambulatory  Blood pressure 157/92, pulse 61, temperature 96.9 F (36.1 C), temperature source Oral, resp. rate 18, height 5\' 7"  (1.702 m), weight 141 lb (63.957 kg).  LABORATORY DATA: Lab Results  Component Value Date   WBC 6.8 08/29/2012   HGB 12.2* 08/29/2012   HCT 36.3* 08/29/2012   MCV 101.7* 08/29/2012   PLT 139* 08/29/2012      Chemistry      Component Value Date/Time   NA 141 08/01/2012 0849   NA 140 12/21/2011 1252   K 4.5 08/01/2012 0849   K 3.3* 12/21/2011 1252   CL 109* 08/01/2012 0849   CL 101 11/18/2011 0858   CO2 23 08/01/2012 0849   CO2 22 11/18/2011 0858   BUN 14.9 08/01/2012 0849   BUN 19 11/18/2011 0858   CREATININE 1.3 08/01/2012 0849   CREATININE 1.34 11/18/2011 0858      Component Value Date/Time   CALCIUM 9.4 08/01/2012 0849   CALCIUM 9.5 11/18/2011 0858   ALKPHOS 89 08/01/2012 0849   ALKPHOS 55 11/18/2011 0858   AST 26 08/01/2012 0849   AST 25 11/18/2011 0858   ALT 16  08/01/2012 0849   ALT 17 11/18/2011 0858   BILITOT 0.57 08/01/2012 0849   BILITOT 0.5 11/18/2011 0858       RADIOGRAPHIC STUDIES: Ct Chest W Contrast  08/25/2012   *RADIOLOGY REPORT*  Clinical Data:  Restaging scan for metastatic non-small cell carcinoma of the lung.  CT CHEST, ABDOMEN AND PELVIS WITH CONTRAST  Technique:  Multidetector CT imaging of the chest, abdomen and pelvis was performed following the standard protocol during bolus administration of intravenous contrast.  Contrast: OMNIPAQUE IOHEXOL 300 MG/ML  SOLN  Comparison:  Multiple priors, most recently a CT of the chest abdomen pelvis 06/02/2012.   CT CHEST  Findings:  Mediastinum: Heart size is normal. Small amount of anterior pericardial fluid and/or  thickening, unlikely to be of any hemodynamic significance of this time, and similar to prior studies. There is atherosclerosis of the thoracic aorta, the great vessels of the mediastinum and the coronary arteries, including calcified atherosclerotic plaque in the left main, left anterior descending, left circumflex and right coronary arteries. Mild ectasia of the ascending thoracic aorta (4.1 cm in diameter), unchanged.  Multiple borderline enlarged mediastinal and right hilar lymph nodes, similar to the prior examination.  No definite pathologic nodal enlargement in the mediastinum.  Esophagus is unremarkable in appearance. Right internal jugular single lumen Port-A-Cath with tip terminating at the superior cavoatrial junction.  Lungs/Pleura: Multiple small pulmonary nodules scattered throughout the lungs bilaterally appear very similar compared to prior examinations.  Specific examples include a 1 cm ovoid shaped nodule in the right apex (image 9 of series 5), and a 4 mm nodule in the lingula (image 44 of series 5), all of which are unchanged compared to prior examinations dating back to at least 06/04/2010, favored to be benign.  Post procedural changes of radiation therapy are noted with  chronic volume loss and architectural distortion in the medial aspect of the right upper lobe where there is also some chronic pleural parenchymal calcification.  No definite new pulmonary nodules are identified.  No acute consolidative air space disease.  No pleural effusions.  There is a background of mild centrilobular and paraseptal emphysema.  Musculoskeletal: There are no aggressive appearing lytic or blastic lesions noted in the visualized portions of the skeleton.  IMPRESSION:  1.  Postradiation changes in the medial aspect of the right upper lobe are again noted, without evidence to suggest local recurrence of disease or new metastatic disease in the thorax. 2. Atherosclerosis, including left main and three-vessel coronary artery disease. In addition, there is mild ectasia of the ascending thoracic aorta.  Assessment for potential risk factor modification, dietary therapy or pharmacologic therapy may be warranted, if clinically indicated. 3.  Additional incidental findings, similar to prior examinations, as above.   CT ABDOMEN AND PELVIS  Findings:  Abdomen/Pelvis: The appearance of the liver, gallbladder, pancreas, spleen, bilateral adrenal glands in the right kidney is unremarkable.  A well-defined 1.2 cm low attenuation nonenhancing lesion in the interpolar region of the left kidney is unchanged, compatible with a small simple cyst.  As with the prior examination, there are numerous borderline enlarged and mildly enlarged retroperitoneal lymph nodes, with the largest single node measuring up to 1.7 x 1.3 cm (image 73 of series 2) between the infrarenal abdominal aorta and the inferior vena cava.  These nodes appear very similar to the recent prior examination but have clearly increased in size compared to more remote prior studies. No definite new lymphadenopathy is otherwise noted.  Circumaortic left renal vein (normal anatomical variant) incidentally noted. Extensive atherosclerosis throughout the  abdominal and pelvic vasculature, including mild fusiform aneurysmal dilatation of the infrarenal abdominal aorta which measures up to 2.5 x 3.0 cm shortly above the level of the bifurcation.  Extensive retroperitoneal stranding is unchanged and nonspecific. No significant volume of ascites.  No pneumoperitoneum.  No pathologic distension of small bowel.  Prostate gland the urinary bladder are unremarkable in appearance.  Musculoskeletal: There are no aggressive appearing lytic or blastic lesions noted in the visualized portions of the skeleton.  IMPRESSION:  1.  Again noted are multiple borderline enlarged and mildly enlarged retroperitoneal lymph nodes.  These are stable compared to the most recent prior study, but have clearly increased compared to more  remote prior examinations.  The etiology and significance of these lymph nodes is uncertain, however, their slow growth over time is concerning.  Continued attention on follow-up studies is recommended. This would be an unusual pattern of spread for a primary lung cancer; however, the possibility of a developing lymphoproliferative disorder is not excluded.  Clinical correlation is suggested. 2.  Extensive atherosclerosis, including a small unchanged fusiform infrarenal abdominal aortic aneurysm. 3.  Additional incidental findings, similar to prior studies, as above.   Original Report Authenticated By: Trudie Reed, M.D.   ASSESSMENT: This is a very pleasant 67 years old white male with metastatic non-small cell lung cancer, adenocarcinoma currently on maintenance chemotherapy with single agent Alimta and Avastin every 3 weeks status post 15 cycles. The patient is rating his treatment fairly well but he has increasing fatigue and weakness recently. He has no evidence for disease progression except for slowly enlarging retroperitoneal lymph nodes.   PLAN: I have a lengthy discussion with the patient and his wife today about his condition. I personally  reviewed the images of the scan and I have some concern about possible evidence for slow disease progression or a different malignancy in the abdomen. I recommended for the patient to continue his current treatment with Alimta and Avastin as scheduled. I will arrange for the patient to have a PET scan for evaluation of the retroperitoneal lymphadenopathy. I would also consider the patient for MRI of the brain to rule out any brain metastasis especially with his significant decline in energy and activity.  I would see the patient back for followup visit in one month as scheduled. He was advised to call me immediately if he has any concerning symptoms in the interval. All questions were answered. The patient knows to call the clinic with any problems, questions or concerns. We can certainly see the patient much sooner if necessary.  I spent 15 minutes counseling the patient face to face. The total time spent in the appointment was 25 minutes.

## 2012-09-19 ENCOUNTER — Encounter (HOSPITAL_COMMUNITY)
Admission: RE | Admit: 2012-09-19 | Discharge: 2012-09-19 | Disposition: A | Discharge status: Home / Self Care | Payer: Medicare Other | Source: Ambulatory Visit | Attending: Internal Medicine | Admitting: Internal Medicine

## 2012-09-19 ENCOUNTER — Other Ambulatory Visit: Payer: Self-pay | Admitting: Internal Medicine

## 2012-09-19 DIAGNOSIS — I77811 Abdominal aortic ectasia: Secondary | ICD-10-CM | POA: Insufficient documentation

## 2012-09-19 DIAGNOSIS — I2510 Atherosclerotic heart disease of native coronary artery without angina pectoris: Secondary | ICD-10-CM | POA: Insufficient documentation

## 2012-09-19 DIAGNOSIS — C3410 Malignant neoplasm of upper lobe, unspecified bronchus or lung: Secondary | ICD-10-CM | POA: Insufficient documentation

## 2012-09-19 DIAGNOSIS — R599 Enlarged lymph nodes, unspecified: Secondary | ICD-10-CM | POA: Insufficient documentation

## 2012-09-19 MED ORDER — FLUDEOXYGLUCOSE F - 18 (FDG) INJECTION
16.7000 | Freq: Once | INTRAVENOUS | Status: AC | PRN
  Administered 2012-09-19: 16.7 via INTRAVENOUS

## 2012-09-19 MED ORDER — GADOBENATE DIMEGLUMINE 529 MG/ML IV SOLN
13.0000 mL | Freq: Once | INTRAVENOUS | Status: AC | PRN
  Administered 2012-09-19: 13 mL via INTRAVENOUS

## 2012-09-26 ENCOUNTER — Ambulatory Visit (HOSPITAL_BASED_OUTPATIENT_CLINIC_OR_DEPARTMENT_OTHER): Payer: Medicare Other | Admitting: Internal Medicine

## 2012-09-26 ENCOUNTER — Ambulatory Visit (HOSPITAL_BASED_OUTPATIENT_CLINIC_OR_DEPARTMENT_OTHER): Payer: Medicare Other

## 2012-09-26 ENCOUNTER — Encounter: Payer: Self-pay | Admitting: Internal Medicine

## 2012-09-26 ENCOUNTER — Telehealth: Payer: Self-pay | Admitting: Internal Medicine

## 2012-09-26 ENCOUNTER — Other Ambulatory Visit: Payer: Medicare Other | Admitting: Lab

## 2012-09-26 ENCOUNTER — Other Ambulatory Visit (HOSPITAL_BASED_OUTPATIENT_CLINIC_OR_DEPARTMENT_OTHER): Payer: Medicare Other | Admitting: Lab

## 2012-09-26 DIAGNOSIS — C3410 Malignant neoplasm of upper lobe, unspecified bronchus or lung: Secondary | ICD-10-CM

## 2012-09-26 DIAGNOSIS — Z5111 Encounter for antineoplastic chemotherapy: Secondary | ICD-10-CM

## 2012-09-26 DIAGNOSIS — Z5112 Encounter for antineoplastic immunotherapy: Secondary | ICD-10-CM

## 2012-09-26 DIAGNOSIS — R599 Enlarged lymph nodes, unspecified: Secondary | ICD-10-CM

## 2012-09-26 DIAGNOSIS — C7A090 Malignant carcinoid tumor of the bronchus and lung: Secondary | ICD-10-CM

## 2012-09-26 LAB — CBC WITH DIFFERENTIAL/PLATELET
Basophils Absolute: 0 10*3/uL (ref 0.0–0.1)
EOS%: 1.9 % (ref 0.0–7.0)
HCT: 34.1 % — ABNORMAL LOW (ref 38.4–49.9)
HGB: 11.4 g/dL — ABNORMAL LOW (ref 13.0–17.1)
MCH: 34.8 pg — ABNORMAL HIGH (ref 27.2–33.4)
MCV: 104 fL — ABNORMAL HIGH (ref 79.3–98.0)
MONO%: 10.8 % (ref 0.0–14.0)
NEUT%: 62.5 % (ref 39.0–75.0)
lymph#: 1.6 10*3/uL (ref 0.9–3.3)

## 2012-09-26 LAB — COMPREHENSIVE METABOLIC PANEL (CC13)
CO2: 23 mEq/L (ref 22–29)
Calcium: 9.3 mg/dL (ref 8.4–10.4)
Chloride: 110 mEq/L — ABNORMAL HIGH (ref 98–109)
Glucose: 95 mg/dl (ref 70–140)
Sodium: 138 mEq/L (ref 136–145)
Total Bilirubin: 0.39 mg/dL (ref 0.20–1.20)
Total Protein: 6.6 g/dL (ref 6.4–8.3)

## 2012-09-26 MED ORDER — ONDANSETRON 8 MG/50ML IVPB (CHCC)
8.0000 mg | Freq: Once | INTRAVENOUS | Status: AC
  Administered 2012-09-26: 8 mg via INTRAVENOUS

## 2012-09-26 MED ORDER — HEPARIN SOD (PORK) LOCK FLUSH 100 UNIT/ML IV SOLN
500.0000 [IU] | Freq: Once | INTRAVENOUS | Status: AC | PRN
  Administered 2012-09-26: 500 [IU]
  Filled 2012-09-26: qty 5

## 2012-09-26 MED ORDER — SODIUM CHLORIDE 0.9 % IV SOLN
15.0000 mg/kg | Freq: Once | INTRAVENOUS | Status: AC
  Administered 2012-09-26: 975 mg via INTRAVENOUS
  Filled 2012-09-26: qty 39

## 2012-09-26 MED ORDER — SODIUM CHLORIDE 0.9 % IJ SOLN
10.0000 mL | INTRAMUSCULAR | Status: DC | PRN
  Administered 2012-09-26: 10 mL
  Filled 2012-09-26: qty 10

## 2012-09-26 MED ORDER — SODIUM CHLORIDE 0.9 % IV SOLN
Freq: Once | INTRAVENOUS | Status: AC
  Administered 2012-09-26: 10:00:00 via INTRAVENOUS

## 2012-09-26 MED ORDER — DEXAMETHASONE SODIUM PHOSPHATE 10 MG/ML IJ SOLN
10.0000 mg | Freq: Once | INTRAMUSCULAR | Status: AC
  Administered 2012-09-26: 10 mg via INTRAVENOUS

## 2012-09-26 MED ORDER — SODIUM CHLORIDE 0.9 % IV SOLN
500.0000 mg/m2 | Freq: Once | INTRAVENOUS | Status: AC
  Administered 2012-09-26: 875 mg via INTRAVENOUS
  Filled 2012-09-26: qty 35

## 2012-09-26 NOTE — Patient Instructions (Signed)
Continue maintenance chemotherapy with Alimta and Avastin. I will refer you to interventional radiology for consideration of core biopsy of the  Enlarging retroperitoneal lymph nodes. Followup visit in one month.

## 2012-09-26 NOTE — Telephone Encounter (Signed)
gv pt appt schedule for July. [facility] will contact pt re bx appt - pt aware.

## 2012-09-26 NOTE — Progress Notes (Signed)
[facility] [phone]   [phone]  OFFICE PROGRESS NOTE  [name], MD [address] [place] [address]  DIAGNOSIS: Metastatic non-small cell lung cancer adenocarcinoma diagnosed in September 2010   PRIOR THERAPY:  #1 status post 6 cycles of systemic chemotherapy with carboplatin, Alimta and Avastin given every 3 weeks last dose given 04/23/2009 with disease stabilization.  #2 status post palliative radiotherapy to the mediastinum under the care of Dr. Michell Heinrich. The patient received a total dose of 3000 cGY and 12 fractions completed 01/21/2009.  #3 Maintenance chemotherapy with Alimta at 500 mg per meter squared and Avastin at 15 mg per kilogram given every 3 weeks status post 35 cycles.   CURRENT THERAPY: Maintenance chemotherapy with Alimta 500 mg/M2 and Avastin 15 mg/kg every 4 weeks,status post 17 cycles.   INTERVAL HISTORY: Niklaus Mamaril 67 y.o. male returns to the clinic today for followup visit accompanied his wife. The patient is tolerating his current maintenance chemotherapy with single agent Alimta fairly well with no significant adverse effects. He denied having any significant weight loss or night sweats. He denied having any nausea or vomiting. The patient has no significant chest pain, shortness breath, cough or hemoptysis. He had a recent PET scan for evaluation of enlarging retroperitoneal lymphadenopathy and he is here for evaluation and discussion of his scan results.  MEDICAL HISTORY: Past Medical History  Diagnosis Date  . Hypertension   . Arthritis RIGHT SHOULDER  . Immature cataract BILATERAL  . Non-small cell lung cancer DX SEPT 2010  W/ CHEMORADIATION AT THAT TIME -- NOW  W/ METS--  CURRENTLY ON MAINTENANCE CHEMO TX  EVERY 30 DAYS    ONCOLOGIST- DR [facility]  . Acute meniscal tear of knee RIGHT KNEE  . BPH (benign prostatic hypertrophy)     ALLERGIES:  is allergic to percocet and zolpidem  tartrate.  MEDICATIONS:  Current Outpatient Prescriptions  Medication Sig Dispense Refill  . amLODipine (NORVASC) 2.5 MG tablet Take 2.5 mg by mouth every morning.       . Ascorbic Acid (VITAMIN C) 1000 MG tablet Take 1,000 mg by mouth daily.      Marland Kitchen atenolol (TENORMIN) 100 MG tablet Take 100 mg by mouth daily.      . B Complex-Biotin-FA (B-COMPLEX PO) Take 1 tablet by mouth daily. 1500mg  daily      . dexamethasone (DECADRON) 4 MG tablet Take 1 tablet (4 mg total) by mouth 2 (two) times daily with a meal. 1 tablet twice a day, the day before, day of and day after chemo  50 tablet  1  . doxazosin (CARDURA) 2 MG tablet Take 2 mg by mouth at bedtime.       . FeFum-FePoly-FA-B Cmp-C-Biot (INTEGRA PLUS) CAPS Take 1 capsule by mouth daily.      . fexofenadine (ALLEGRA) 180 MG tablet Take 180 mg by mouth daily as needed.       . folic acid (FOLVITE) 1 MG tablet Take 1 tablet (1 mg total) by mouth daily.  90 tablet  3  . HYDROcodone-homatropine (HYCODAN) 5-1.5 MG/5ML syrup Take 5 mLs by mouth every 6 (six) hours as needed.  120 mL  0  . Multiple Vitamin (MULTIVITAMIN) tablet Take 1 tablet by mouth daily.       Marland Kitchen omeprazole (PRILOSEC) 40 MG capsule Take 1 capsule (40 mg total) by mouth every evening.  90 capsule  3  . Sennosides-Docusate Sodium (SENOKOT S PO)  Take 2 tablets by mouth daily. 2 tabs daily       No current facility-administered medications for this visit.    SURGICAL HISTORY:  Past Surgical History  Procedure Laterality Date  . 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 Rotator cuff repair  2008    LEFT SHOULDER  . Bilateral ear drum surgery  1960'S  . Orif left ring finger  and revascularization of radial side  02-16-2007    DEGLOVING INJURY  . Knee arthroscopy  12/21/2011    Procedure: ARTHROSCOPY KNEE;  Surgeon: Jacki Cones, MD;  Location: [facility];  Service: Orthopedics;  Laterality: Right;   WITH MEDIAL MENISECTOMY    REVIEW OF SYSTEMS:  A comprehensive review of systems was negative.   PHYSICAL EXAMINATION: General appearance: alert, cooperative and no distress Head: Normocephalic, without obvious abnormality, atraumatic Neck: no adenopathy Lymph nodes: Cervical, supraclavicular, and axillary nodes normal. Resp: clear to auscultation bilaterally Cardio: regular rate and rhythm, S1, S2 normal, no murmur, click, rub or gallop GI: soft, non-tender; bowel sounds normal; no masses,  no organomegaly Extremities: extremities normal, atraumatic, no cyanosis or edema Neurologic: Alert and oriented X 3, normal strength and tone. Normal symmetric reflexes. Normal coordination and gait  ECOG PERFORMANCE STATUS: 1 - Symptomatic but completely ambulatory  Blood pressure 130/73, pulse 64, temperature 97.7 F (36.5 C), temperature source Oral, resp. rate 18, height 5\' 7"  (1.702 m), weight 137 lb 8 oz (62.37 kg), SpO2 99.00%.  LABORATORY DATA: Lab Results  Component Value Date   WBC 6.4 09/26/2012   HGB 11.4* 09/26/2012   HCT 34.1* 09/26/2012   MCV 104.0* 09/26/2012   PLT 122* 09/26/2012      Chemistry      Component Value Date/Time   NA 142 08/29/2012 0856   NA 140 12/21/2011 1252   K 4.1 08/29/2012 0856   K 3.3* 12/21/2011 1252   CL 109* 08/29/2012 0856   CL 101 11/18/2011 0858   CO2 23 08/29/2012 0856   CO2 22 11/18/2011 0858   BUN 19.0 08/29/2012 0856   BUN 19 11/18/2011 0858   CREATININE 1.3 08/29/2012 0856   CREATININE 1.34 11/18/2011 0858      Component Value Date/Time   CALCIUM 9.4 08/29/2012 0856   CALCIUM 9.5 11/18/2011 0858   ALKPHOS 87 08/29/2012 0856   ALKPHOS 55 11/18/2011 0858   AST 27 08/29/2012 0856   AST 25 11/18/2011 0858   ALT 17 08/29/2012 0856   ALT 17 11/18/2011 0858   BILITOT 0.76 08/29/2012 0856   BILITOT 0.5 11/18/2011 0858       RADIOGRAPHIC STUDIES: Mr Laqueta Jean WU Contrast  2012-10-17   *RADIOLOGY REPORT*  Clinical Data: 67 year old male with metastatic non-small cell  lung cancer. Staging.  MRI HEAD WITHOUT AND WITH CONTRAST  Technique:  Multiplanar, multiecho pulse sequences of the brain and surrounding structures were obtained according to standard protocol without and with intravenous contrast  Contrast: 13mL MULTIHANCE GADOBENATE DIMEGLUMINE 529 MG/ML IV SOLN  Comparison: Head CT 06/04/2010.  Findings: No abnormal enhancement identified.  No dural thickening. No midline shift, mass effect, or evidence of mass lesion.  No restricted diffusion to suggest acute infarction.  No ventriculomegaly.  No acute or chronic intracranial hemorrhage identified.  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  Other Major intracranial vascular flow voids are preserved.  Minimal or mild  for age nonspecific cerebral white matter T2 and FLAIR hyperintensity.  No cortical encephalomalacia or chronic infarct.  Negative pituitary, cervicomedullary junction and visualized cervical spinal cord.  The  Visualized bone marrow signal is within normal limits. Postoperative changes to the globes.  Mild paranasal sinus mucosal thickening.  Trace mastoid fluid.  Negative scalp soft tissues.  IMPRESSION: 1. No acute or metastatic intracranial abnormality. 2.  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   Original Report Authenticated By: Erskine Speed, M.D.   Nm Pet Image Restag (ps) Skull Base To Thigh  09/19/2012   *RADIOLOGY REPORT*  Clinical Data: Subsequent treatment strategy for right upper lobe primary bronchogenic carcinoma.  NUCLEAR MEDICINE PET SKULL BASE TO THIGH  Fasting Blood Glucose:  96  Technique:  16.7 mCi F-18 FDG was injected intravenously. CT data was obtained and used for attenuation correction and anatomic localization only.  (This was not acquired as a diagnostic CT examination.) Additional exam technical data entered on technologist worksheet.   Comparison:  Diagnostic CTs of 08/25/2012.  Most recent PET of 12/17/2008.  Findings:  Neck: Left supraclavicular hypermetabolism which is adjacent to the left jugular vein and could be physiologic or related to a small adjacent node.  The node measures 6 mm image 57/series 2. Hypermetabolism measures a S.U.V. max of 4.6.  Chest:  Hypermetabolic right paratracheal node at 9 mm and a S.U.V. max of 6.5 on image 18/series 2.  No pulmonary parenchymal hypermetabolism identified.  Abdomen/Pelvis:  A hepatoduodenal ligament node which measures 9 mm and a S.U.V. max of 8.5 on image 139/series 2. Retroperitoneal hypermetabolic adenopathy.  Index aortocaval node measures 1.4 x 1.7 cm and a S.U.V. max of 12.6 on image 154/series 2.  High pelvic nodal hypermetabolism.  Index left common iliac node measures 9 mm and  a S.U.V. max of 8.1 on image 188 image 187/series 2.  Skeleton:  Hypermetabolism within the right glenoid which is favored to be degenerative, and is without definite CT correlate. This measures a S.U.V. max of 6.6.  CT  images performed for attenuation correction demonstrate no further findings within the neck.  Chest, abdomen, and pelvic findings deferred to recent diagnostic CTs.  Cardiomegaly and coronary artery atherosclerosis.  Radiation changes in the right upper lobe.  Infrarenal aortic dilatation, as detailed previously.  IMPRESSION:  1.  Hypermetabolism corresponding to slowly enlarging nodes within the chest, abdomen, and pelvis.  Suspicious for a lymphoproliferative process.  An atypical appearance of metastatic disease could look similar.  Tissue sampling should be considered. 2.  Equivocal hypermetabolism within the left supraclavicular region.  Although this could be vascular, isolated nodal disease within the low left neck cannot be excluded. 3.  Probable degenerative right glenoid hypermetabolism. Recommend attention on follow-up.   Original Report Authenticated By: Jeronimo Greaves, M.D.     ASSESSMENT AND PLAN: This is a very pleasant 67 years old white male with metastatic non-small cell lung cancer, adenocarcinoma currently on maintenance chemotherapy with Alimta and Avastin every 4 weeks.  He is tolerating his treatment fairly well. The PET scan showed slowly enlarging hypermetabolic lymphadenopathy in the chest, abdomen and pelvis suspicious for lymphoproliferative disorder versus atypical appearance of metastatic disease. I have a lengthy discussion with the patient and his wife today about his scan results and further recommendation regarding evaluation of this abnormality. I recommended for the patient to proceed with CT guided  core biopsy of one of the enlarged retroperitoneal lymph nodes by interventional radiology. I would see the patient back for followup visit in one month for evaluation before starting the next cycle of his chemotherapy His MRI of the brain showed no evidence for metastatic disease. The patient was advised to call immediately if he has any concerning symptoms in the interval. All questions were answered. The patient knows to call the clinic with any problems, questions or concerns. We can certainly see the patient much sooner if necessary.  I spent 15 minutes counseling the patient face to face. The total time spent in the appointment was 25 minutes.

## 2012-09-26 NOTE — Patient Instructions (Addendum)
[facility] Discharge Instructions for Patients Receiving Chemotherapy  Today you received the following chemotherapy agents Avastin/Alimta  To help prevent nausea and vomiting after your treatment, we encourage you to take your nausea medication as prescribed.   If you develop nausea and vomiting that is not controlled by your nausea medication, call the clinic.   BELOW ARE SYMPTOMS THAT SHOULD BE REPORTED IMMEDIATELY:  *FEVER GREATER THAN 100.5 F  *CHILLS WITH OR WITHOUT FEVER  NAUSEA AND VOMITING THAT IS NOT CONTROLLED WITH YOUR NAUSEA MEDICATION  *UNUSUAL SHORTNESS OF BREATH  *UNUSUAL BRUISING OR BLEEDING  TENDERNESS IN MOUTH AND THROAT WITH OR WITHOUT PRESENCE OF ULCERS  *URINARY PROBLEMS  *BOWEL PROBLEMS  UNUSUAL RASH Items with * indicate a potential emergency and should be followed up as soon as possible.  Feel free to call the clinic you have any questions or concerns. The clinic phone number is [phone].    

## 2012-09-28 ENCOUNTER — Encounter (HOSPITAL_COMMUNITY): Payer: Self-pay | Admitting: Pharmacy Technician

## 2012-09-28 ENCOUNTER — Other Ambulatory Visit: Payer: Self-pay | Admitting: Radiology

## 2012-10-05 ENCOUNTER — Encounter (HOSPITAL_COMMUNITY): Payer: Self-pay

## 2012-10-05 ENCOUNTER — Ambulatory Visit (HOSPITAL_COMMUNITY)
Admission: RE | Admit: 2012-10-05 | Discharge: 2012-10-05 | Disposition: A | Discharge status: Home / Self Care | Payer: Medicare Other | Source: Ambulatory Visit | Attending: Internal Medicine | Admitting: Internal Medicine

## 2012-10-05 DIAGNOSIS — I10 Essential (primary) hypertension: Secondary | ICD-10-CM | POA: Insufficient documentation

## 2012-10-05 DIAGNOSIS — Z79899 Other long term (current) drug therapy: Secondary | ICD-10-CM | POA: Insufficient documentation

## 2012-10-05 DIAGNOSIS — C3490 Malignant neoplasm of unspecified part of unspecified bronchus or lung: Secondary | ICD-10-CM | POA: Insufficient documentation

## 2012-10-05 DIAGNOSIS — C770 Secondary and unspecified malignant neoplasm of lymph nodes of head, face and neck: Secondary | ICD-10-CM | POA: Insufficient documentation

## 2012-10-05 LAB — CBC
Hemoglobin: 10.5 g/dL — ABNORMAL LOW (ref 13.0–17.0)
MCH: 35 pg — ABNORMAL HIGH (ref 26.0–34.0)
MCV: 100.7 fL — ABNORMAL HIGH (ref 78.0–100.0)
Platelets: 71 10*3/uL — ABNORMAL LOW (ref 150–400)
RBC: 3 MIL/uL — ABNORMAL LOW (ref 4.22–5.81)
WBC: 2.4 10*3/uL — ABNORMAL LOW (ref 4.0–10.5)

## 2012-10-05 LAB — PROTIME-INR: Prothrombin Time: 13.4 seconds (ref 11.6–15.2)

## 2012-10-05 MED ORDER — FENTANYL CITRATE 0.05 MG/ML IJ SOLN
INTRAMUSCULAR | Status: AC | PRN
  Administered 2012-10-05: 100 ug via INTRAVENOUS
  Administered 2012-10-05: 50 ug via INTRAVENOUS

## 2012-10-05 MED ORDER — MIDAZOLAM HCL 2 MG/2ML IJ SOLN
INTRAMUSCULAR | Status: DC
Start: 2012-10-05 — End: 2012-10-06
  Filled 2012-10-05: qty 6

## 2012-10-05 MED ORDER — SODIUM CHLORIDE 0.9 % IV SOLN
Freq: Once | INTRAVENOUS | Status: AC
  Administered 2012-10-05: 11:00:00 via INTRAVENOUS

## 2012-10-05 MED ORDER — MIDAZOLAM HCL 2 MG/2ML IJ SOLN
INTRAMUSCULAR | Status: AC | PRN
  Administered 2012-10-05 (×3): 1 mg via INTRAVENOUS

## 2012-10-05 MED ORDER — FENTANYL CITRATE 0.05 MG/ML IJ SOLN
INTRAMUSCULAR | Status: AC
  Filled 2012-10-05: qty 6

## 2012-10-05 NOTE — Procedures (Signed)
SUCCESSFUL LT SUPRACLAVICULAR LN FNA NO COMP STABLE FULL REPORT IN PACS PATH PENDING

## 2012-10-05 NOTE — H&P (Signed)
Chief Complaint: "I'm here for a biopsy" Referring Physician:Mohamed HPI: [name] is an 67 y.o. male with hx of non-small cell adenocarcinoma of the lung. He had a recent PET which showed adenopathy. He is referred for bx of one of these nodes. Upon review of his imaging, he is scheduled for attempt at biopsy of a (L) supraclavicular node. PMHx and meds reviewed.   Past Medical History:  Past Medical History  Diagnosis Date  . Hypertension   . Arthritis RIGHT SHOULDER  . Immature cataract BILATERAL  . Non-small cell lung cancer DX SEPT 2010  W/ CHEMORADIATION AT THAT TIME -- NOW  W/ METS--  CURRENTLY ON MAINTENANCE CHEMO TX  EVERY 30 DAYS    ONCOLOGIST- DR [facility]  . Acute meniscal tear of knee RIGHT KNEE  . BPH (benign prostatic hypertrophy)     Past Surgical History:  Past Surgical History  Procedure Laterality Date  . 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 Rotator cuff repair  2008    LEFT SHOULDER  . Bilateral ear drum surgery  1960'S  . Orif left ring finger  and revascularization of radial side  02-16-2007    DEGLOVING INJURY  . Knee arthroscopy  12/21/2011    Procedure: ARTHROSCOPY KNEE;  Surgeon: Jacki Cones, MD;  Location: [facility];  Service: Orthopedics;  Laterality: Right;  WITH MEDIAL MENISECTOMY    Family History:  Family History  Problem Relation Age of Onset  . Colon cancer    . Heart attack    . Cancer    . Hypertension    . Hyperlipidemia      Social History:  reports that he quit smoking about 31 years ago. His smoking use included Cigarettes. He has a 40 pack-year smoking history. He has never used smokeless tobacco. He reports that he drinks about 3.5 ounces of alcohol per week. His drug history is not on file.  Allergies:  Allergies  Allergen Reactions  . Percocet (Oxycodone-Acetaminophen) Nausea And Vomiting  . Zolpidem Tartrate Diarrhea     Medications: amLODipine (NORVASC) 2.5 MG tablet (Taking) 01/13/2011 Sig - Route: Take 2.5 mg by mouth every morning. - Oral Class: Historical Med Number of times this order has been changed since signing: [address] Ascorbic Acid (VITAMIN C) 1000 MG tablet (Taking) Sig - Route: Take 1,000 mg by mouth daily. - Oral Class: Historical Med Number of times this order has been changed since signing: 1 [address] atenolol (TENORMIN) 100 MG tablet (Taking) Sig - Route: Take 100 mg by mouth every morning. - Oral Class: Historical Med Number of times this order has been changed since signing: 2 [address] B Complex-Biotin-FA (B-COMPLEX PO) (Taking) Sig - Route: Take 1 tablet by mouth daily. 1500mg  daily - Oral Class: Historical Med Number of times this order has been changed since signing: 4 [address] doxazosin (CARDURA) 2 MG tablet (Taking) 12/18/2010 Sig - Route: Take 2 mg by mouth at bedtime. - Oral Class: Historical Med Number of times this order has been changed since signing: 2 [address] FeFum-FePoly-FA-B Cmp-C-Biot (INTEGRA PLUS) CAPS (Taking) Sig - Route: Take 1 capsule by mouth daily. - Oral Class: Historical Med Number of times this order has been changed since signing: 1 [address] fexofenadine (ALLEGRA) 180 MG tablet (Taking) Sig - Route: Take 180 mg by mouth daily as needed. Allergies - Oral  Class: Historical Med Number of times this order has been changed since signing: 4 Order [address] Multiple Vitamin (MULTIVITAMIN) tablet (Taking) Sig - Route: Take 1 tablet by mouth daily. - Oral Class: Historical Med Number of times this order has been changed since signing: 3 Order [address] Sennosides-Docusate Sodium (SENOKOT S PO) (Taking) Sig - Route: Take 2 tablets by mouth daily. 2 tabs daily - Oral Class: Historical Med    Please HPI for pertinent positives, otherwise complete 10 system ROS negative.  Physical Exam: BP 130/87  Pulse 67  Temp(Src) 97.2  F (36.2 C) (Oral)  Resp 18  Ht 5\' 7"  (1.702 m)  Wt 137 lb (62.143 kg)  BMI 21.45 kg/m2  SpO2 99% Body mass index is 21.45 kg/(m^2).   General Appearance:  Alert, cooperative, no distress, appears stated age  Head:  Normocephalic, without obvious abnormality, atraumatic  ENT: Unremarkable  Neck: Supple, symmetrical, trachea midline. No palpable nodes  Lungs:   Clear to auscultation bilaterally, no w/r/r, respirations unlabored without use of accessory muscles.  Chest Wall:  No tenderness or deformity. Rt port palpable  Heart:  Regular rate and rhythm, S1, S2 normal, no murmur, rub or gallop.  Neurologic: Normal affect, no gross deficits.   Results for orders placed during the hospital encounter of 10/05/12 (from the past 48 hour(s))  APTT     Status: None   Collection Time    10/05/12 11:15 AM      Result Value Range   aPTT 35  24 - 37 seconds  CBC     Status: Abnormal   Collection Time    10/05/12 11:15 AM      Result Value Range   WBC 2.4 (*) 4.0 - 10.5 K/uL   RBC 3.00 (*) 4.22 - 5.81 MIL/uL   Hemoglobin 10.5 (*) 13.0 - 17.0 g/dL   HCT 16.1 (*) 09.6 - 04.5 %   MCV 100.7 (*) 78.0 - 100.0 fL   MCH 35.0 (*) 26.0 - 34.0 pg   MCHC 34.8  30.0 - 36.0 g/dL   RDW 40.9  81.1 - 91.4 %   Platelets 71 (*) 150 - 400 K/uL   Comment: REPEATED TO VERIFY     SPECIMEN CHECKED FOR CLOTS     PLATELET COUNT CONFIRMED BY SMEAR  PROTIME-INR     Status: None   Collection Time    10/05/12 11:15 AM      Result Value Range   Prothrombin Time 13.4  11.6 - 15.2 seconds   INR 1.04  0.00 - 1.49   No results found.  Assessment/Plan Hx of lung cancer New lymphadenopathy US guided biopsy of (L)[address] LN. Discussed procedure, risks, complications, use of sedation Labs reviewed, ok Consent signed in chart  Brayton El PA-C 10/05/2012, 12:45 PM

## 2012-10-24 ENCOUNTER — Ambulatory Visit: Payer: Medicare Other

## 2012-10-24 ENCOUNTER — Ambulatory Visit (HOSPITAL_BASED_OUTPATIENT_CLINIC_OR_DEPARTMENT_OTHER): Payer: Medicare Other | Admitting: Internal Medicine

## 2012-10-24 ENCOUNTER — Other Ambulatory Visit (HOSPITAL_BASED_OUTPATIENT_CLINIC_OR_DEPARTMENT_OTHER): Payer: Medicare Other | Admitting: Lab

## 2012-10-24 ENCOUNTER — Encounter: Payer: Self-pay | Admitting: Internal Medicine

## 2012-10-24 ENCOUNTER — Encounter: Payer: Self-pay | Admitting: *Deleted

## 2012-10-24 DIAGNOSIS — C3410 Malignant neoplasm of upper lobe, unspecified bronchus or lung: Secondary | ICD-10-CM

## 2012-10-24 DIAGNOSIS — C7A090 Malignant carcinoid tumor of the bronchus and lung: Secondary | ICD-10-CM

## 2012-10-24 LAB — CBC WITH DIFFERENTIAL/PLATELET
Basophils Absolute: 0 10*3/uL (ref 0.0–0.1)
Eosinophils Absolute: 0.1 10*3/uL (ref 0.0–0.5)
HGB: 11.4 g/dL — ABNORMAL LOW (ref 13.0–17.1)
MONO#: 0.7 10*3/uL (ref 0.1–0.9)
NEUT#: 2.8 10*3/uL (ref 1.5–6.5)
Platelets: 122 10*3/uL — ABNORMAL LOW (ref 140–400)
RBC: 3.24 10*6/uL — ABNORMAL LOW (ref 4.20–5.82)
RDW: 15.7 % — ABNORMAL HIGH (ref 11.0–14.6)
WBC: 5.2 10*3/uL (ref 4.0–10.3)

## 2012-10-24 LAB — UA PROTEIN, DIPSTICK - CHCC: Protein, ur: 100 mg/dL

## 2012-10-24 NOTE — Progress Notes (Addendum)
[facility] [phone]   Fax:(336) [phone]  OFFICE PROGRESS NOTE  [name], MD [address] [place] [address]  DIAGNOSIS AND DIAGNOSIS: Metastatic non-small cell lung cancer adenocarcinoma diagnosed in September 2010   PRIOR THERAPY:  #1 status post 6 cycles of systemic chemotherapy with carboplatin, Alimta and Avastin given every 3 weeks last dose given 04/23/2009 with disease stabilization.  #2 status post palliative radiotherapy to the mediastinum under the care of Dr. Michell Heinrich. The patient received a total dose of 3000 cGY and 12 fractions completed 01/21/2009.  #3 Maintenance chemotherapy with Alimta at 500 mg per meter squared and Avastin at 15 mg per kilogram given every 3 weeks status post 35 cycles.  #4 Maintenance chemotherapy with Alimta 500 mg/M2 and Avastin 15 mg/kg every 4 weeks,status post 17 cycles, discontinued secondary to disease progression.  CURRENT THERAPY: the patient is currently considered for clinical trial with anti-PD 1, Nivolumab.  CHEMOTHERAPY INTENT: palliative  CURRENT # OF CHEMOTHERAPY CYCLES: 0  CURRENT ANTIEMETICS: Compazine when necessary but clearly used.  CURRENT SMOKING STATUS: current nonsmoker  ORAL CHEMOTHERAPY AND CONSENT: None.  CURRENT BISPHOSPHONATES USE: None.  PAIN MANAGEMENT: No Pain  NARCOTICS INDUCED CONSTIPATION: N/A  LIVING WILL AND CODE STATUS: NCB  INTERVAL HISTORY: [name] 68 y.o. male returns to the clinic today for follow up visit accompanied by his wife. The patient is feeling fine today with no specific complaints except for mild fatigue. He denied having any significant chest pain, shortness of breath, cough or hemoptysis. He tolerated the last cycle of his systemic chemotherapy fairly well with no significant adverse effects. He denied having any nausea or vomiting. She has no fever or chills. The patient had ultrasound guided core biopsy of the left  supraclavicular lymph node and the final pathology was consistent with metastatic non-small cell carcinoma. He is here today for evaluation and discussion of his treatment options.  MEDICAL HISTORY: Past Medical History  Diagnosis Date  . Hypertension   . Arthritis RIGHT SHOULDER  . Immature cataract BILATERAL  . Non-small cell lung cancer DX SEPT 2010  W/ CHEMORADIATION AT THAT TIME -- NOW  W/ METS--  CURRENTLY ON MAINTENANCE CHEMO TX  EVERY 30 DAYS    ONCOLOGIST- DR [facility]  . Acute meniscal tear of knee RIGHT KNEE  . BPH (benign prostatic hypertrophy)     ALLERGIES:  is allergic to percocet and zolpidem tartrate.  MEDICATIONS:  Current Outpatient Prescriptions  Medication Sig Dispense Refill  . amLODipine (NORVASC) 2.5 MG tablet Take 2.5 mg by mouth every morning.       . Ascorbic Acid (VITAMIN C) 1000 MG tablet Take 1,000 mg by mouth daily.      Marland Kitchen atenolol (TENORMIN) 100 MG tablet Take 100 mg by mouth every morning.       . B Complex-Biotin-FA (B-COMPLEX PO) Take 1 tablet by mouth daily. 1500mg  daily      . dexamethasone (DECADRON) 4 MG tablet Take 4 mg by mouth 2 (two) times daily with a meal. 1 tablet twice a day, the day before, day of and day after chemo      . doxazosin (CARDURA) 2 MG tablet Take 2 mg by mouth at bedtime.       . FeFum-FePoly-FA-B Cmp-C-Biot (INTEGRA PLUS) CAPS Take 1 capsule by mouth daily.      . fexofenadine (ALLEGRA) 180 MG tablet Take 180 mg by mouth daily as needed. Allergies      .  folic acid (FOLVITE) 1 MG tablet Take 1 mg by mouth daily.      Marland Kitchen HYDROcodone-homatropine (HYCODAN) 5-1.5 MG/5ML syrup Take 5 mLs by mouth every 6 (six) hours as needed for cough.      . Multiple Vitamin (MULTIVITAMIN) tablet Take 1 tablet by mouth daily.       Marland Kitchen omeprazole (PRILOSEC) 40 MG capsule Take 40 mg by mouth every evening.      Bernadette Hoit Sodium (SENOKOT S PO) Take 2 tablets by mouth daily. 2 tabs daily       No current facility-administered medications  for this visit.    SURGICAL HISTORY:  Past Surgical History  Procedure Laterality Date  . 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 Rotator cuff repair  2008    LEFT SHOULDER  . Bilateral ear drum surgery  1960'S  . Orif left ring finger  and revascularization of radial side  02-16-2007    DEGLOVING INJURY  . Knee arthroscopy  12/21/2011    Procedure: ARTHROSCOPY KNEE;  Surgeon: Jacki Cones, MD;  Location: [facility];  Service: Orthopedics;  Laterality: Right;  WITH MEDIAL MENISECTOMY    REVIEW OF SYSTEMS:  A comprehensive review of systems was negative except for: Constitutional: positive for fatigue   PHYSICAL EXAMINATION: General appearance: alert, cooperative, fatigued and no distress Head: Normocephalic, without obvious abnormality, atraumatic Neck: no adenopathy Lymph nodes: Cervical, supraclavicular, and axillary nodes normal. Resp: clear to auscultation bilaterally Cardio: regular rate and rhythm, S1, S2 normal, no murmur, click, rub or gallop GI: soft, non-tender; bowel sounds normal; no masses,  no organomegaly Extremities: extremities normal, atraumatic, no cyanosis or edema Neurologic: Alert and oriented X 3, normal strength and tone. Normal symmetric reflexes. Normal coordination and gait  ECOG PERFORMANCE STATUS: 1 - Symptomatic but completely ambulatory  Blood pressure 153/76, pulse 63, temperature 97.3 F (36.3 C), temperature source Oral, resp. rate 20, height 5\' 7"  (1.702 m), weight 140 lb 12.8 oz (63.866 kg).  LABORATORY DATA: Lab Results  Component Value Date   WBC 5.2 10/24/2012   HGB 11.4* 10/24/2012   HCT 33.6* 10/24/2012   MCV 103.7* 10/24/2012   PLT 122* 10/24/2012      Chemistry      Component Value Date/Time   NA 138 09/26/2012 0901   NA 140 12/21/2011 1252   K 4.4 09/26/2012 0901   K 3.3* 12/21/2011 1252   CL 109* 08/29/2012 0856   CL 101 11/18/2011 0858   CO2 23  09/26/2012 0901   CO2 22 11/18/2011 0858   BUN 26.1* 09/26/2012 0901   BUN 19 11/18/2011 0858   CREATININE 1.5* 09/26/2012 0901   CREATININE 1.34 11/18/2011 0858      Component Value Date/Time   CALCIUM 9.3 09/26/2012 0901   CALCIUM 9.5 11/18/2011 0858   ALKPHOS 85 09/26/2012 0901   ALKPHOS 55 11/18/2011 0858   AST 24 09/26/2012 0901   AST 25 11/18/2011 0858   ALT 13 09/26/2012 0901   ALT 17 11/18/2011 0858   BILITOT 0.39 09/26/2012 0901   BILITOT 0.5 11/18/2011 0858       RADIOGRAPHIC STUDIES: US Biopsy  10/05/2012   *RADIOLOGY REPORT*  Clinical Data: Non-small cell lung cancer, PET positive left cervical lymph node.  ULTRASOUND LEFT SUPRACLAVICULAR LEFT NODE 20 GAUGE FNA BIOPSY  Date:  10/05/2012 13:00:00  Radiologist:  M. Ruel Favors, M.D.  Medications:  Versed Fentanyl  for conscious sedation  Guidance:  Ultrasound  Sedation time:  20 minutes  PROCEDURE/FINDINGS:  Informed consent was obtained from the patient following explanation of the procedure, risks, benefits and alternatives. The patient understands, agrees and consents for the procedure. All questions were addressed.  A time out was performed.  Maximal barrier sterile technique utilized including caps, mask, sterile gowns, sterile gloves, large sterile drape, hand hygiene, and betadine  Previous imaging reviewed. Preliminary ultrasound performed.  An irregular hypoechoic solid 10 mm left supraclavicular lymph node is demonstrated.  This correlates with the PET finding.  Under sterile conditions and local anesthesia, 20 gauge franseen needles were utilized to perform FNA biopsy three times.  Samples reviewed by pathology.  Malignant cells demonstrated.  Cellularity adequate. The patient tolerated the biopsy well.  No immediate complication.  IMPRESSION: Ultrasound left supraclavicular abnormal lymph node FNA biopsy.   Original Report Authenticated By: Judie Petit. Shick, M.D.    ASSESSMENT AND PLAN: this is a very pleasant 67 years old white male with metastatic  non-small cell lung cancer diagnosed in September of 2000 and status post systemic chemotherapy with carboplatin, Alimta and Avastin followed by maintenance chemotherapy with Alimta and Avastin for almost 4 years.  Unfortunately the patient has evidence for 6 to disease progression with increase in supraclavicular, mediastinal as well as abdominal lymphadenopathy. The biopsy of the left supraclavicular lymph node was consistent with metastatic non-small cell carcinoma. I discussed the biopsy results as well as the previous scan results with the patient and his wife. I  Give him a few options for management of his condition including adding carboplatin to his current maintenance therapy with Alimta and Avastin for 6 cycles versus considering the patient for the immunotherapy with Anti PD-1 antibody, Nivolumab which we just few days ago at the [facility]. The patient was interested in the clinical trial and he will be seen by the clinical research nurse today for evaluation of eligibility. His followup visit and treatment would be according to the protocol if the patient is eligible for the clinical trial. I gave the patient and his wife the time to ask questions and I answered them completely to their satisfaction.  The patient voices understanding of current disease status and treatment options and is in agreement with the current care plan.  All questions were answered. The patient knows to call the clinic with any problems, questions or concerns. We can certainly see the patient much sooner if necessary.  I spent 15 minutes counseling the patient face to face. The total time spent in the appointment was 25 minutes.

## 2012-10-24 NOTE — Patient Instructions (Signed)
CHEMOTHERAPY INTENT: palliative  CURRENT # OF CHEMOTHERAPY CYCLES: 0  CURRENT ANTIEMETICS: Compazine when necessary but clearly used.  CURRENT SMOKING STATUS: current nonsmoker  ORAL CHEMOTHERAPY AND CONSENT: None.  CURRENT BISPHOSPHONATES USE: None.  PAIN MANAGEMENT: No Pain  NARCOTICS INDUCED CONSTIPATION: N/A  LIVING WILL AND CODE STATUS: NCB

## 2012-10-25 ENCOUNTER — Encounter: Payer: Self-pay | Admitting: *Deleted

## 2012-10-26 ENCOUNTER — Ambulatory Visit (HOSPITAL_COMMUNITY)
Admission: RE | Admit: 2012-10-26 | Discharge: 2012-10-26 | Disposition: A | Discharge status: Home / Self Care | Payer: Medicare Other | Source: Ambulatory Visit | Attending: Internal Medicine | Admitting: Internal Medicine

## 2012-10-26 DIAGNOSIS — R599 Enlarged lymph nodes, unspecified: Secondary | ICD-10-CM | POA: Insufficient documentation

## 2012-10-26 DIAGNOSIS — C3490 Malignant neoplasm of unspecified part of unspecified bronchus or lung: Secondary | ICD-10-CM | POA: Insufficient documentation

## 2012-10-26 MED ORDER — IOHEXOL 300 MG/ML  SOLN
100.0000 mL | Freq: Once | INTRAMUSCULAR | Status: AC | PRN
  Administered 2012-10-26: 100 mL via INTRAVENOUS

## 2012-10-30 ENCOUNTER — Other Ambulatory Visit: Payer: Self-pay | Admitting: Internal Medicine

## 2012-10-31 ENCOUNTER — Telehealth: Payer: Self-pay | Admitting: Internal Medicine

## 2012-10-31 NOTE — Telephone Encounter (Signed)
lvm for pt regarding to 8.7.14 appt.Marland Kitchen

## 2012-11-02 ENCOUNTER — Ambulatory Visit: Payer: Medicare Other | Admitting: Internal Medicine

## 2012-11-02 ENCOUNTER — Other Ambulatory Visit: Payer: Medicare Other | Admitting: Lab

## 2012-11-07 ENCOUNTER — Telehealth: Payer: Self-pay | Admitting: Medical Oncology

## 2012-11-07 ENCOUNTER — Other Ambulatory Visit (HOSPITAL_BASED_OUTPATIENT_CLINIC_OR_DEPARTMENT_OTHER): Payer: Medicare Other | Admitting: Lab

## 2012-11-07 ENCOUNTER — Encounter: Payer: Self-pay | Admitting: Internal Medicine

## 2012-11-07 ENCOUNTER — Ambulatory Visit (HOSPITAL_BASED_OUTPATIENT_CLINIC_OR_DEPARTMENT_OTHER): Payer: Medicare Other | Admitting: Internal Medicine

## 2012-11-07 ENCOUNTER — Telehealth: Payer: Self-pay | Admitting: Internal Medicine

## 2012-11-07 ENCOUNTER — Telehealth: Payer: Self-pay | Admitting: *Deleted

## 2012-11-07 ENCOUNTER — Ambulatory Visit (HOSPITAL_BASED_OUTPATIENT_CLINIC_OR_DEPARTMENT_OTHER): Payer: Medicare Other

## 2012-11-07 VITALS — BP 171/82 | HR 59 | Resp 19 | Ht 67.0 in | Wt 139.8 lb

## 2012-11-07 DIAGNOSIS — R112 Nausea with vomiting, unspecified: Secondary | ICD-10-CM

## 2012-11-07 DIAGNOSIS — Z5112 Encounter for antineoplastic immunotherapy: Secondary | ICD-10-CM

## 2012-11-07 DIAGNOSIS — C3491 Malignant neoplasm of unspecified part of right bronchus or lung: Secondary | ICD-10-CM

## 2012-11-07 DIAGNOSIS — C3410 Malignant neoplasm of upper lobe, unspecified bronchus or lung: Secondary | ICD-10-CM

## 2012-11-07 DIAGNOSIS — Z5111 Encounter for antineoplastic chemotherapy: Secondary | ICD-10-CM

## 2012-11-07 DIAGNOSIS — C770 Secondary and unspecified malignant neoplasm of lymph nodes of head, face and neck: Secondary | ICD-10-CM

## 2012-11-07 DIAGNOSIS — C7A090 Malignant carcinoid tumor of the bronchus and lung: Secondary | ICD-10-CM

## 2012-11-07 LAB — COMPREHENSIVE METABOLIC PANEL (CC13)
AST: 24 U/L (ref 5–34)
BUN: 19.6 mg/dL (ref 7.0–26.0)
Calcium: 9.6 mg/dL (ref 8.4–10.4)
Chloride: 110 mEq/L — ABNORMAL HIGH (ref 98–109)
Creatinine: 1.3 mg/dL (ref 0.7–1.3)
Glucose: 99 mg/dl (ref 70–140)

## 2012-11-07 LAB — CBC WITH DIFFERENTIAL/PLATELET
BASO%: 0.6 % (ref 0.0–2.0)
Basophils Absolute: 0.1 10*3/uL (ref 0.0–0.1)
Eosinophils Absolute: 0.2 10*3/uL (ref 0.0–0.5)
HCT: 37.7 % — ABNORMAL LOW (ref 38.4–49.9)
HGB: 12.7 g/dL — ABNORMAL LOW (ref 13.0–17.1)
MONO#: 1 10*3/uL — ABNORMAL HIGH (ref 0.1–0.9)
NEUT#: 6 10*3/uL (ref 1.5–6.5)
NEUT%: 68.5 % (ref 39.0–75.0)
Platelets: 143 10*3/uL (ref 140–400)
WBC: 8.8 10*3/uL (ref 4.0–10.3)
lymph#: 1.5 10*3/uL (ref 0.9–3.3)

## 2012-11-07 MED ORDER — SODIUM CHLORIDE 0.9 % IJ SOLN
10.0000 mL | INTRAMUSCULAR | Status: DC | PRN
  Administered 2012-11-07: 10 mL
  Filled 2012-11-07: qty 10

## 2012-11-07 MED ORDER — DEXAMETHASONE SODIUM PHOSPHATE 20 MG/5ML IJ SOLN
20.0000 mg | Freq: Once | INTRAMUSCULAR | Status: AC
  Administered 2012-11-07: 20 mg via INTRAVENOUS

## 2012-11-07 MED ORDER — CYANOCOBALAMIN 1000 MCG/ML IJ SOLN
1000.0000 ug | Freq: Once | INTRAMUSCULAR | Status: AC
  Administered 2012-11-07: 1000 ug via INTRAMUSCULAR

## 2012-11-07 MED ORDER — SODIUM CHLORIDE 0.9 % IV SOLN
Freq: Once | INTRAVENOUS | Status: AC
  Administered 2012-11-07: 11:00:00 via INTRAVENOUS

## 2012-11-07 MED ORDER — CARBOPLATIN CHEMO INTRADERMAL TEST DOSE 100MCG/0.02ML
100.0000 ug | Freq: Once | INTRADERMAL | Status: AC
  Administered 2012-11-07: 100 ug via INTRADERMAL
  Filled 2012-11-07: qty 0.01

## 2012-11-07 MED ORDER — PROCHLORPERAZINE MALEATE 10 MG PO TABS: 10.0000 mg | ORAL_TABLET | Freq: Four times a day (QID) | ORAL | Status: DC | PRN

## 2012-11-07 MED ORDER — SODIUM CHLORIDE 0.9 % IV SOLN
370.0000 mg | Freq: Once | INTRAVENOUS | Status: AC
  Administered 2012-11-07: 370 mg via INTRAVENOUS
  Filled 2012-11-07: qty 37

## 2012-11-07 MED ORDER — ONDANSETRON 16 MG/50ML IVPB (CHCC)
16.0000 mg | Freq: Once | INTRAVENOUS | Status: AC
  Administered 2012-11-07: 16 mg via INTRAVENOUS

## 2012-11-07 MED ORDER — SODIUM CHLORIDE 0.9 % IV SOLN
500.0000 mg/m2 | Freq: Once | INTRAVENOUS | Status: AC
  Administered 2012-11-07: 875 mg via INTRAVENOUS
  Filled 2012-11-07: qty 35

## 2012-11-07 MED ORDER — SODIUM CHLORIDE 0.9 % IV SOLN
15.0000 mg/kg | Freq: Once | INTRAVENOUS | Status: AC
  Administered 2012-11-07: 975 mg via INTRAVENOUS
  Filled 2012-11-07: qty 39

## 2012-11-07 MED ORDER — HEPARIN SOD (PORK) LOCK FLUSH 100 UNIT/ML IV SOLN
500.0000 [IU] | Freq: Once | INTRAVENOUS | Status: AC | PRN
  Administered 2012-11-07: 500 [IU]
  Filled 2012-11-07: qty 5

## 2012-11-07 NOTE — Progress Notes (Signed)
[facility] Telephone:(336) [phone]   Fax:(336) [phone]  OFFICE PROGRESS NOTE  [name], MD [address] [place] [address]  DIAGNOSIS AND DIAGNOSIS: Metastatic non-small cell lung cancer adenocarcinoma diagnosed in September 2010   PRIOR THERAPY:  #1 status post 6 cycles of systemic chemotherapy with carboplatin, Alimta and Avastin given every 3 weeks last dose given 04/23/2009 with disease stabilization.  #2 status post palliative radiotherapy to the mediastinum under the care of Dr. Michell Heinrich. The patient received a total dose of 3000 cGY and 12 fractions completed 01/21/2009.  #3 Maintenance chemotherapy with Alimta at 500 mg per meter squared and Avastin at 15 mg per kilogram given every 3 weeks status post 35 cycles.  #4 Maintenance chemotherapy with Alimta 500 mg/M2 and Avastin 15 mg/kg every 4 weeks,status post 17 cycles, discontinued secondary to disease progression.   CURRENT THERAPY: Systemic chemotherapy with carboplatin for AUC of 5, Alimta 500 mg/M2 and Avastin 15 mg/kg every 3 weeks, first dose today.  CHEMOTHERAPY INTENT: palliative  CURRENT # OF CHEMOTHERAPY CYCLES: 0  CURRENT ANTIEMETICS: Compazine when necessary but clearly used.  CURRENT SMOKING STATUS: current nonsmoker  ORAL CHEMOTHERAPY AND CONSENT: None.  CURRENT BISPHOSPHONATES USE: None.  PAIN MANAGEMENT: No Pain  NARCOTICS INDUCED CONSTIPATION: N/A  LIVING WILL AND CODE STATUS: NCB   INTERVAL HISTORY: [name] 67 y.o. male returns to the clinic today for followup visit. The patient is feeling fine today with no specific complaints. He was evaluated for enrollment in the clinical trial with Anti PD-1, Nivolumab but he was not eligible for the trial because he does not have measurable disease that fits the criteria for the trial. He is here today for evaluation and discussion of other treatment options. He denied having any significant chest pain, shortness  breath, cough or hemoptysis. The patient denied having any significant weight loss or night sweats.  MEDICAL HISTORY: Past Medical History  Diagnosis Date  . Hypertension   . Arthritis RIGHT SHOULDER  . Immature cataract BILATERAL  . Non-small cell lung cancer DX SEPT 2010  W/ CHEMORADIATION AT THAT TIME -- NOW  W/ METS--  CURRENTLY ON MAINTENANCE CHEMO TX  EVERY 30 DAYS    ONCOLOGIST- DR [facility]  . Acute meniscal tear of knee RIGHT KNEE  . BPH (benign prostatic hypertrophy)     ALLERGIES:  is allergic to percocet and zolpidem tartrate.  MEDICATIONS:  Current Outpatient Prescriptions  Medication Sig Dispense Refill  . amLODipine (NORVASC) 2.5 MG tablet Take 2.5 mg by mouth every morning.       . Ascorbic Acid (VITAMIN C) 1000 MG tablet Take 1,000 mg by mouth daily.      Marland Kitchen atenolol (TENORMIN) 100 MG tablet Take 100 mg by mouth every morning.       . B Complex-Biotin-FA (B-COMPLEX PO) Take 1 tablet by mouth daily. 1500mg  daily      . doxazosin (CARDURA) 2 MG tablet Take 2 mg by mouth at bedtime.       . FeFum-FePoly-FA-B Cmp-C-Biot (INTEGRA PLUS) CAPS Take 1 capsule by mouth daily.      . fexofenadine (ALLEGRA) 180 MG tablet Take 180 mg by mouth daily as needed. Allergies      . folic acid (FOLVITE) 1 MG tablet Take 1 mg by mouth daily.      . Multiple Vitamin (MULTIVITAMIN) tablet Take 1 tablet by mouth daily.       Marland Kitchen omeprazole (PRILOSEC) 40 MG  capsule Take 40 mg by mouth every evening.      Bernadette Hoit Sodium (SENOKOT S PO) Take 2 tablets by mouth daily. 2 tabs daily      . HYDROcodone-homatropine (HYCODAN) 5-1.5 MG/5ML syrup Take 5 mLs by mouth every 6 (six) hours as needed for cough.       No current facility-administered medications for this visit.    SURGICAL HISTORY:  Past Surgical History  Procedure Laterality Date  . 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 Rotator cuff repair  2008    LEFT  SHOULDER  . Bilateral ear drum surgery  1960'S  . Orif left ring finger  and revascularization of radial side  02-16-2007    DEGLOVING INJURY  . Knee arthroscopy  12/21/2011    Procedure: ARTHROSCOPY KNEE;  Surgeon: Jacki Cones, MD;  Location: [facility];  Service: Orthopedics;  Laterality: Right;  WITH MEDIAL MENISECTOMY    REVIEW OF SYSTEMS:  A comprehensive review of systems was negative.   PHYSICAL EXAMINATION: General appearance: alert, cooperative and no distress Head: Normocephalic, without obvious abnormality, atraumatic Neck: no adenopathy Lymph nodes: Cervical, supraclavicular, and axillary nodes normal. Resp: clear to auscultation bilaterally Back: symmetric, no curvature. ROM normal. No CVA tenderness. Cardio: regular rate and rhythm, S1, S2 normal, no murmur, click, rub or gallop GI: soft, non-tender; bowel sounds normal; no masses,  no organomegaly Extremities: extremities normal, atraumatic, no cyanosis or edema Neurologic: Alert and oriented X 3, normal strength and tone. Normal symmetric reflexes. Normal coordination and gait  ECOG PERFORMANCE STATUS: 1 - Symptomatic but completely ambulatory  Blood pressure 171/82, pulse 59, resp. rate 19, height 5\' 7"  (1.702 m), weight 139 lb 12.8 oz (63.413 kg).  LABORATORY DATA: Lab Results  Component Value Date   WBC 8.8 11/07/2012   HGB 12.7* 11/07/2012   HCT 37.7* 11/07/2012   MCV 103.0* 11/07/2012   PLT 143 11/07/2012      Chemistry      Component Value Date/Time   NA 138 09/26/2012 0901   NA 140 12/21/2011 1252   K 4.4 09/26/2012 0901   K 3.3* 12/21/2011 1252   CL 109* 08/29/2012 0856   CL 101 11/18/2011 0858   CO2 23 09/26/2012 0901   CO2 22 11/18/2011 0858   BUN 26.1* 09/26/2012 0901   BUN 19 11/18/2011 0858   CREATININE 1.5* 09/26/2012 0901   CREATININE 1.34 11/18/2011 0858      Component Value Date/Time   CALCIUM 9.3 09/26/2012 0901   CALCIUM 9.5 11/18/2011 0858   ALKPHOS 85 09/26/2012 0901   ALKPHOS 55  11/18/2011 0858   AST 24 09/26/2012 0901   AST 25 11/18/2011 0858   ALT 13 09/26/2012 0901   ALT 17 11/18/2011 0858   BILITOT 0.39 09/26/2012 0901   BILITOT 0.5 11/18/2011 0858       RADIOGRAPHIC STUDIES: Ct Chest W Contrast  10/26/2012   *RADIOLOGY REPORT*  Clinical Data:  Restaging lung cancer.  CT CHEST AND ABDOMEN WITH CONTRAST  Technique:  Multidetector CT imaging of the chest and abdomen was performed following the standard protocol during bolus administration of intravenous contrast.  Contrast: OMNIPAQUE IOHEXOL 300 MG/ML  SOLN  Comparison:  CT 08/25/2012, PET CT 09/19/2012 of the  CT CHEST  Findings:  No axillary or supraclavicular lymphadenopathy.  No mediastinal or hilar lymphadenopathy.  Small paratracheal lymph nodes are less than 10 mm.  No pericardial fluid.  Review of the lung parenchyma demonstrates no suspicious pulmonary nodules.  IMPRESSION:  1.  No mediastinal or supraclavicular lymphadenopathy.  2.   No suspicious pulmonary nodules.    CT ABDOMEN  Findings:  No focal hepatic lesion.  The gallbladder, pancreas, spleen, adrenal glands, and kidneys are normal.  The stomach and limited view of small bowel and colon unremarkable.  The mildly enlarged retroperitoneal lymph nodes are again demonstrated.  Lymph node between the aorta and IVC on image 71 measures 10 mm compared to 14 mm on prior.  Lymph nodes posterior to the aorta on image 77 measures 8 mm compared to 9 mm on prior. These lymph nodes were hypermetabolic on comparison PET scan.  The gastrohepatic ligament lymph node is less well defined measuring 8 mm compared to a 9 mm on prior (image 62).  Review bone windows demonstrates no aggressive osseous lesions.  IMPRESSION: 1.  No evidence of metastatic disease progression in the abdomen. 2.  Retroperitoneally and gastrohepatic ligament lymph nodes are similar to slightly decreased in size compared to prior.  These were hypermetabolic on comparison PET CT scan.   Original Report  Authenticated By: Genevive Bi, M.D.   ASSESSMENT AND PLAN: This is a very pleasant 67 years old white male with metastatic non-small cell lung cancer, adenocarcinoma status post maintenance chemotherapy with Alimta and Avastin for several years but unfortunately has evidence for disease progression on the recent scans.  I discussed the treatment option with the patient today after he was found to be ineligible for the clinical trial. I recommended for him treatment with systemic chemotherapy the form of carboplatin for AUC of 5, Alimta 500 mg/M2 and Avastin 15 mg/kilogram every 3 weeks. He was started for a cycle of this treatment today. The patient received this treatment in the past and he is very familiar with the adverse effects including but not limited to mild alopecia, myelosuppression, nausea and vomiting, liver or renal dysfunction. He would come back for followup visit in 3 weeks for evaluation and management any adverse effect of his chemotherapy. He was advised to call immediately if he has any concerning symptoms in the interval.  The patient voices understanding of current disease status and treatment options and is in agreement with the current care plan.  All questions were answered. The patient knows to call the clinic with any problems, questions or concerns. We can certainly see the patient much sooner if necessary.  I spent 15 minutes counseling the patient face to face. The total time spent in the appointment was 25 minutes.

## 2012-11-07 NOTE — Telephone Encounter (Signed)
Compazine escribed to pt pharmacy 

## 2012-11-07 NOTE — Patient Instructions (Signed)
[facility] Discharge Instructions for Patients Receiving Chemotherapy  Today you received the following chemotherapy agents:  Avastin, Alimta and Carboplatin.  To help prevent nausea and vomiting after your treatment, we encourage you to take your nausea medication as ordered per MD.   If you develop nausea and vomiting that is not controlled by your nausea medication, call the clinic.   BELOW ARE SYMPTOMS THAT SHOULD BE REPORTED IMMEDIATELY:  *FEVER GREATER THAN 100.5 F  *CHILLS WITH OR WITHOUT FEVER  NAUSEA AND VOMITING THAT IS NOT CONTROLLED WITH YOUR NAUSEA MEDICATION  *UNUSUAL SHORTNESS OF BREATH  *UNUSUAL BRUISING OR BLEEDING  TENDERNESS IN MOUTH AND THROAT WITH OR WITHOUT PRESENCE OF ULCERS  *URINARY PROBLEMS  *BOWEL PROBLEMS  UNUSUAL RASH Items with * indicate a potential emergency and should be followed up as soon as possible.  Feel free to call the clinic you have any questions or concerns. The clinic phone number is [phone].

## 2012-11-07 NOTE — Progress Notes (Signed)
Vital signs stable pre and post Avastin 

## 2012-11-07 NOTE — Progress Notes (Signed)
1100-OK to give Avastin today with urine protein of 300 per Dr. Arbutus Ped.

## 2012-11-07 NOTE — Telephone Encounter (Signed)
Per staff message and POF I have scheduled appts.  JMW  

## 2012-11-07 NOTE — Telephone Encounter (Signed)
gv adn printed appt sched and avs for pt...emailed MW to add tx.   °

## 2012-11-07 NOTE — Patient Instructions (Signed)
We discussed treatment with carboplatin, Alimta and Avastin.  Followup visit in 3 weeks.

## 2012-11-08 ENCOUNTER — Telehealth: Payer: Self-pay | Admitting: *Deleted

## 2012-11-08 NOTE — Telephone Encounter (Signed)
Called & left message at pt's home # to call back & let us know how he did with his carboplatin yest.

## 2012-11-13 ENCOUNTER — Other Ambulatory Visit: Payer: Self-pay | Admitting: Certified Registered Nurse Anesthetist

## 2012-11-14 ENCOUNTER — Other Ambulatory Visit (HOSPITAL_BASED_OUTPATIENT_CLINIC_OR_DEPARTMENT_OTHER): Payer: Medicare Other | Admitting: Lab

## 2012-11-14 DIAGNOSIS — C3491 Malignant neoplasm of unspecified part of right bronchus or lung: Secondary | ICD-10-CM

## 2012-11-14 DIAGNOSIS — C3410 Malignant neoplasm of upper lobe, unspecified bronchus or lung: Secondary | ICD-10-CM

## 2012-11-14 LAB — COMPREHENSIVE METABOLIC PANEL (CC13)
BUN: 36.2 mg/dL — ABNORMAL HIGH (ref 7.0–26.0)
CO2: 19 mEq/L — ABNORMAL LOW (ref 22–29)
Creatinine: 1.7 mg/dL — ABNORMAL HIGH (ref 0.7–1.3)
Glucose: 107 mg/dl (ref 70–140)
Total Bilirubin: 0.98 mg/dL (ref 0.20–1.20)
Total Protein: 6.4 g/dL (ref 6.4–8.3)

## 2012-11-14 LAB — CBC WITH DIFFERENTIAL/PLATELET
Basophils Absolute: 0 10*3/uL (ref 0.0–0.1)
Eosinophils Absolute: 0.2 10*3/uL (ref 0.0–0.5)
HCT: 33.1 % — ABNORMAL LOW (ref 38.4–49.9)
LYMPH%: 34.3 % (ref 14.0–49.0)
MCV: 103.1 fL — ABNORMAL HIGH (ref 79.3–98.0)
MONO#: 0.4 10*3/uL (ref 0.1–0.9)
NEUT#: 1.8 10*3/uL (ref 1.5–6.5)
NEUT%: 49.6 % (ref 39.0–75.0)
Platelets: 77 10*3/uL — ABNORMAL LOW (ref 140–400)
WBC: 3.5 10*3/uL — ABNORMAL LOW (ref 4.0–10.3)

## 2012-11-21 ENCOUNTER — Other Ambulatory Visit (HOSPITAL_BASED_OUTPATIENT_CLINIC_OR_DEPARTMENT_OTHER): Payer: Medicare Other | Admitting: Lab

## 2012-11-21 DIAGNOSIS — C3491 Malignant neoplasm of unspecified part of right bronchus or lung: Secondary | ICD-10-CM

## 2012-11-21 DIAGNOSIS — C3490 Malignant neoplasm of unspecified part of unspecified bronchus or lung: Secondary | ICD-10-CM

## 2012-11-21 LAB — COMPREHENSIVE METABOLIC PANEL (CC13)
ALT: 17 U/L (ref 0–55)
AST: 23 U/L (ref 5–34)
BUN: 22.8 mg/dL (ref 7.0–26.0)
Calcium: 9.3 mg/dL (ref 8.4–10.4)
Chloride: 110 mEq/L — ABNORMAL HIGH (ref 98–109)
Creatinine: 1.5 mg/dL — ABNORMAL HIGH (ref 0.7–1.3)
Total Bilirubin: 0.98 mg/dL (ref 0.20–1.20)

## 2012-11-21 LAB — CBC WITH DIFFERENTIAL/PLATELET
Eosinophils Absolute: 0.1 10*3/uL (ref 0.0–0.5)
HCT: 31.1 % — ABNORMAL LOW (ref 38.4–49.9)
HGB: 10.9 g/dL — ABNORMAL LOW (ref 13.0–17.1)
LYMPH%: 26.3 % (ref 14.0–49.0)
MONO#: 0.5 10*3/uL (ref 0.1–0.9)
NEUT#: 2.2 10*3/uL (ref 1.5–6.5)
Platelets: 35 10*3/uL — ABNORMAL LOW (ref 140–400)
RBC: 3.08 10*6/uL — ABNORMAL LOW (ref 4.20–5.82)
WBC: 3.9 10*3/uL — ABNORMAL LOW (ref 4.0–10.3)

## 2012-11-28 ENCOUNTER — Encounter: Payer: Self-pay | Admitting: Physician Assistant

## 2012-11-28 ENCOUNTER — Telehealth: Payer: Self-pay | Admitting: *Deleted

## 2012-11-28 ENCOUNTER — Ambulatory Visit (HOSPITAL_BASED_OUTPATIENT_CLINIC_OR_DEPARTMENT_OTHER): Payer: Medicare Other | Admitting: Physician Assistant

## 2012-11-28 ENCOUNTER — Telehealth: Payer: Self-pay | Admitting: Internal Medicine

## 2012-11-28 ENCOUNTER — Other Ambulatory Visit (HOSPITAL_BASED_OUTPATIENT_CLINIC_OR_DEPARTMENT_OTHER): Payer: Medicare Other | Admitting: Lab

## 2012-11-28 ENCOUNTER — Ambulatory Visit: Payer: Medicare Other

## 2012-11-28 DIAGNOSIS — C3410 Malignant neoplasm of upper lobe, unspecified bronchus or lung: Secondary | ICD-10-CM

## 2012-11-28 DIAGNOSIS — C3490 Malignant neoplasm of unspecified part of unspecified bronchus or lung: Secondary | ICD-10-CM

## 2012-11-28 DIAGNOSIS — C3491 Malignant neoplasm of unspecified part of right bronchus or lung: Secondary | ICD-10-CM

## 2012-11-28 DIAGNOSIS — C7A090 Malignant carcinoid tumor of the bronchus and lung: Secondary | ICD-10-CM

## 2012-11-28 LAB — CBC WITH DIFFERENTIAL/PLATELET
Basophils Absolute: 0 10*3/uL (ref 0.0–0.1)
HCT: 30.9 % — ABNORMAL LOW (ref 38.4–49.9)
HGB: 10.7 g/dL — ABNORMAL LOW (ref 13.0–17.1)
LYMPH%: 30.7 % (ref 14.0–49.0)
MCHC: 34.6 g/dL (ref 32.0–36.0)
MONO#: 0.6 10*3/uL (ref 0.1–0.9)
NEUT%: 51.2 % (ref 39.0–75.0)
Platelets: 91 10*3/uL — ABNORMAL LOW (ref 140–400)
WBC: 3.6 10*3/uL — ABNORMAL LOW (ref 4.0–10.3)
lymph#: 1.1 10*3/uL (ref 0.9–3.3)

## 2012-11-28 NOTE — Patient Instructions (Addendum)
Chemotherapy has been postponed by 1 week due to your low platelet count  Follow up in 4 weeks prior to next scheduled cycle of chemotherapy

## 2012-11-28 NOTE — Progress Notes (Addendum)
[facility] [phone]   [phone]  SHARED VISIT PROGRESS NOTE  [name], MD [address] [place] [address]  DIAGNOSIS AND DIAGNOSIS: Metastatic non-small cell lung cancer adenocarcinoma diagnosed in September 2010   PRIOR THERAPY:  #1 status post 6 cycles of systemic chemotherapy with carboplatin, Alimta and Avastin given every 3 weeks last dose given 04/23/2009 with disease stabilization.  #2 status post palliative radiotherapy to the mediastinum under the care of Dr. Michell Heinrich. The patient received a total dose of 3000 cGY and 12 fractions completed 01/21/2009.  #3 Maintenance chemotherapy with Alimta at 500 mg per meter squared and Avastin at 15 mg per kilogram given every 3 weeks status post 35 cycles.  #4 Maintenance chemotherapy with Alimta 500 mg/M2 and Avastin 15 mg/kg every 4 weeks,status post 17 cycles, discontinued secondary to disease progression.   CURRENT THERAPY: Systemic chemotherapy with carboplatin for AUC of 5, Alimta 500 mg/M2 and Avastin 15 mg/kg every 3 weeks, status post 1 cycle.  CHEMOTHERAPY INTENT: palliative  CURRENT # OF CHEMOTHERAPY CYCLES: 1 CURRENT ANTIEMETICS: Compazine when necessary but clearly used.  CURRENT SMOKING STATUS: current nonsmoker  ORAL CHEMOTHERAPY AND CONSENT: None.  CURRENT BISPHOSPHONATES USE: None.  PAIN MANAGEMENT: No Pain  NARCOTICS INDUCED CONSTIPATION: N/A  LIVING WILL AND CODE STATUS: NCB   INTERVAL HISTORY: [name] 67 y.o. male returns to the clinic today for followup visit. The patient is feeling fine today with no specific complaints. He was evaluated for enrollment in the clinical trial with Anti PD-1, Nivolumab but he was not eligible for the trial because he does not have measurable disease that fits the criteria for the trial. He is now being treated with systemic chemotherapy in the form of carboplatin, Alimta and Avastin, status post 1 cycle. He  reports having fatigue but last 7-10 days after chemotherapy. Also notes that the area where he was given his test dose of carboplatin on his left forearm is somewhat discolored although not read itching or painful. He requests if possible if he can get his weekly labs at his primary care physician's office at [facility] in [facility] as it is a 50 mile round-trip for him to come to [place]. He denied having any significant chest pain, shortness breath, cough or hemoptysis. The patient denied having any significant weight loss or night sweats.  MEDICAL HISTORY: Past Medical History  Diagnosis Date  . Hypertension   . Arthritis RIGHT SHOULDER  . Immature cataract BILATERAL  . Non-small cell lung cancer DX SEPT 2010  W/ CHEMORADIATION AT THAT TIME -- NOW  W/ METS--  CURRENTLY ON MAINTENANCE CHEMO TX  EVERY 30 DAYS    ONCOLOGIST- DR [facility]  . Acute meniscal tear of knee RIGHT KNEE  . BPH (benign prostatic hypertrophy)     ALLERGIES:  is allergic to percocet and zolpidem tartrate.  MEDICATIONS:  Current Outpatient Prescriptions  Medication Sig Dispense Refill  . amLODipine (NORVASC) 2.5 MG tablet Take 2.5 mg by mouth every morning.       . Ascorbic Acid (VITAMIN C) 1000 MG tablet Take 1,000 mg by mouth daily.      Marland Kitchen atenolol (TENORMIN) 100 MG tablet Take 100 mg by mouth every morning.       . B Complex-Biotin-FA (B-COMPLEX PO) Take 1 tablet by mouth daily. 1500mg  daily      . doxazosin (CARDURA) 2 MG tablet Take 2 mg by mouth at bedtime.       Marland Kitchen  FeFum-FePoly-FA-B Cmp-C-Biot (INTEGRA PLUS) CAPS Take 1 capsule by mouth daily.      . fexofenadine (ALLEGRA) 180 MG tablet Take 180 mg by mouth daily as needed. Allergies      . folic acid (FOLVITE) 1 MG tablet Take 1 mg by mouth daily.      Marland Kitchen HYDROcodone-homatropine (HYCODAN) 5-1.5 MG/5ML syrup Take 5 mLs by mouth every 6 (six) hours as needed for cough.      . Multiple Vitamin (MULTIVITAMIN) tablet Take 1 tablet by mouth daily.        Marland Kitchen omeprazole (PRILOSEC) 40 MG capsule Take 40 mg by mouth every evening.      . prochlorperazine (COMPAZINE) 10 MG tablet Take 1 tablet (10 mg total) by mouth every 6 (six) hours as needed.  30 tablet  0  . Sennosides-Docusate Sodium (SENOKOT S PO) Take 2 tablets by mouth daily. 2 tabs daily       No current facility-administered medications for this visit.    SURGICAL HISTORY:  Past Surgical History  Procedure Laterality Date  . 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 Rotator cuff repair  2008    LEFT SHOULDER  . Bilateral ear drum surgery  1960'S  . Orif left ring finger  and revascularization of radial side  02-16-2007    DEGLOVING INJURY  . Knee arthroscopy  12/21/2011    Procedure: ARTHROSCOPY KNEE;  Surgeon: Jacki Cones, MD;  Location: [facility];  Service: Orthopedics;  Laterality: Right;  WITH MEDIAL MENISECTOMY    REVIEW OF SYSTEMS:  A comprehensive review of systems was negative.   PHYSICAL EXAMINATION: General appearance: alert, cooperative and no distress Head: Normocephalic, without obvious abnormality, atraumatic Neck: no adenopathy Lymph nodes: Cervical, supraclavicular, and axillary nodes normal. Resp: clear to auscultation bilaterally Back: symmetric, no curvature. ROM normal. No CVA tenderness. Cardio: regular rate and rhythm, S1, S2 normal, no murmur, click, rub or gallop GI: soft, non-tender; bowel sounds normal; no masses,  no organomegaly Extremities: extremities normal, atraumatic, no cyanosis or edema Neurologic: Alert and oriented X 3, normal strength and tone. Normal symmetric reflexes. Normal coordination and gait  ECOG PERFORMANCE STATUS: 1 - Symptomatic but completely ambulatory  Blood pressure 148/83, pulse 55, temperature 96.9 F (36.1 C), temperature source Oral, resp. rate 18, height 5\' 7"  (1.702 m), weight 136 lb 9.6 oz (61.961 kg).  LABORATORY DATA: Lab  Results  Component Value Date   WBC 3.6* 11/28/2012   HGB 10.7* 11/28/2012   HCT 30.9* 11/28/2012   MCV 100.7* 11/28/2012   PLT 91* 11/28/2012      Chemistry      Component Value Date/Time   NA 139 11/21/2012 0953   NA 140 12/21/2011 1252   K 4.6 11/21/2012 0953   K 3.3* 12/21/2011 1252   CL 109* 08/29/2012 0856   CL 101 11/18/2011 0858   CO2 20* 11/21/2012 0953   CO2 22 11/18/2011 0858   BUN 22.8 11/21/2012 0953   BUN 19 11/18/2011 0858   CREATININE 1.5* 11/21/2012 0953   CREATININE 1.34 11/18/2011 0858      Component Value Date/Time   CALCIUM 9.3 11/21/2012 0953   CALCIUM 9.5 11/18/2011 0858   ALKPHOS 72 11/21/2012 0953   ALKPHOS 55 11/18/2011 0858   AST 23 11/21/2012 0953   AST 25 11/18/2011 0858   ALT 17 11/21/2012 0953   ALT 17 11/18/2011 0858   BILITOT  0.98 11/21/2012 0953   BILITOT 0.5 11/18/2011 0858       RADIOGRAPHIC STUDIES: Ct Chest W Contrast  10/26/2012   *RADIOLOGY REPORT*  Clinical Data:  Restaging lung cancer.  CT CHEST AND ABDOMEN WITH CONTRAST  Technique:  Multidetector CT imaging of the chest and abdomen was performed following the standard protocol during bolus administration of intravenous contrast.  Contrast: OMNIPAQUE IOHEXOL 300 MG/ML  SOLN  Comparison:  CT 08/25/2012, PET CT 09/19/2012 of the  CT CHEST  Findings:  No axillary or supraclavicular lymphadenopathy.  No mediastinal or hilar lymphadenopathy.  Small paratracheal lymph nodes are less than 10 mm.  No pericardial fluid.  Review of the lung parenchyma demonstrates no suspicious pulmonary nodules.  IMPRESSION:  1.  No mediastinal or supraclavicular lymphadenopathy.  2.   No suspicious pulmonary nodules.    CT ABDOMEN  Findings:  No focal hepatic lesion.  The gallbladder, pancreas, spleen, adrenal glands, and kidneys are normal.  The stomach and limited view of small bowel and colon unremarkable.  The mildly enlarged retroperitoneal lymph nodes are again demonstrated.  Lymph node between the aorta and IVC on image 71  measures 10 mm compared to 14 mm on prior.  Lymph nodes posterior to the aorta on image 77 measures 8 mm compared to 9 mm on prior. These lymph nodes were hypermetabolic on comparison PET scan.  The gastrohepatic ligament lymph node is less well defined measuring 8 mm compared to a 9 mm on prior (image 62).  Review bone windows demonstrates no aggressive osseous lesions.  IMPRESSION: 1.  No evidence of metastatic disease progression in the abdomen. 2.  Retroperitoneally and gastrohepatic ligament lymph nodes are similar to slightly decreased in size compared to prior.  These were hypermetabolic on comparison PET CT scan.   Original Report Authenticated By: Genevive Bi, M.D.   ASSESSMENT AND PLAN: This is a very pleasant 67 years old white male with metastatic non-small cell lung cancer, adenocarcinoma status post maintenance chemotherapy with Alimta and Avastin for several years but unfortunately has evidence for disease progression on the recent scans. He was found to be ineligible for the clinical trial. He is currently being treated with systemic chemotherapy the form of carboplatin for AUC of 5, Alimta 500 mg/M2 and Avastin 15 mg/kilogram every 3 weeks, status post 1 cycle. The patient was discussed with also seen by Dr. Arbutus Ped. He had significant fatigue related to his first cycle of systemic chemotherapy with carboplatin, Alimta and Avastin. Also his platelet count has been slow to recover. We will postpone his chemotherapy by one week and also decrease his carboplatin to an AUC of 4. He'll return in one week with repeat labs and proceed with chemotherapy as long as his platelet and other parameters are within normal treatable ranges. He'll followup with Dr. Arbutus Ped in 4 weeks prior to his next scheduled cycle of chemotherapy with a repeat CBC differential and C. met. We will honor his request to have his weekly labs done at his primary care physician's office with them fax them the results to Dr.  Asa Lente attention.  Laural Benes, Pheonix Wisby E, PA-C   He was advised to call immediately if he has any concerning symptoms in the interval.  The patient voices understanding of current disease status and treatment options and is in agreement with the current care plan.  All questions were answered. The patient knows to call the clinic with any problems, questions or concerns. We can certainly see the patient much  sooner if necessary.  ADDENDUM: Hematology/Oncology Attending: I have a face to face encounter with the patient today. I recommended his care plan. He is currently undergoing systemic chemotherapy with carboplatin, Alimta and Avastin for metastatic non-small cell lung cancer. The patient tolerated the first cycle fairly well except for increasing fatigue and thrombocytopenia. His platelets count are still low today. I will delay his chemotherapy by one week to give him more time for platelet recovery.  He would come back for followup visit in 4 weeks with the next cycle of his chemotherapy. I will reduce the dose of carboplatin to AUC of 4 starting from cycle #2 secondary to his thrombocytopenia. Lajuana Matte., MD 11/28/2012

## 2012-11-28 NOTE — Telephone Encounter (Signed)
gv andprinted appt sched and avs for pt for Sept...emailed MW to add tx...pt is having labs at primary physian.Marland KitchenMarland KitchenMarland Kitchen

## 2012-11-28 NOTE — Telephone Encounter (Signed)
Per staff message and POF I have scheduled appts.  JMW  

## 2012-11-30 ENCOUNTER — Other Ambulatory Visit: Payer: Medicare Other | Admitting: Lab

## 2012-12-04 ENCOUNTER — Telehealth: Payer: Self-pay | Admitting: *Deleted

## 2012-12-04 ENCOUNTER — Telehealth: Payer: Self-pay | Admitting: Internal Medicine

## 2012-12-04 NOTE — Telephone Encounter (Signed)
Per staff message and POF I have scheduled appts.  JMW  

## 2012-12-04 NOTE — Telephone Encounter (Signed)
s.w. pt and he needed all of his weekly lab added back on due to primary physician not accepting prescription to have labs there.  pt needed to move 9.30.14 appts to 10.2.14 due to going out og town...emailed MW and Dr. Kerry Fort to make aware

## 2012-12-05 ENCOUNTER — Ambulatory Visit (HOSPITAL_BASED_OUTPATIENT_CLINIC_OR_DEPARTMENT_OTHER): Payer: Medicare Other

## 2012-12-05 ENCOUNTER — Other Ambulatory Visit (HOSPITAL_BASED_OUTPATIENT_CLINIC_OR_DEPARTMENT_OTHER): Payer: Medicare Other

## 2012-12-05 ENCOUNTER — Other Ambulatory Visit: Payer: Medicare Other | Admitting: Lab

## 2012-12-05 DIAGNOSIS — Z5111 Encounter for antineoplastic chemotherapy: Secondary | ICD-10-CM

## 2012-12-05 DIAGNOSIS — C3410 Malignant neoplasm of upper lobe, unspecified bronchus or lung: Secondary | ICD-10-CM

## 2012-12-05 DIAGNOSIS — C3491 Malignant neoplasm of unspecified part of right bronchus or lung: Secondary | ICD-10-CM

## 2012-12-05 DIAGNOSIS — Z5112 Encounter for antineoplastic immunotherapy: Secondary | ICD-10-CM

## 2012-12-05 LAB — CBC WITH DIFFERENTIAL/PLATELET
Basophils Absolute: 0 10*3/uL (ref 0.0–0.1)
Eosinophils Absolute: 0 10*3/uL (ref 0.0–0.5)
HCT: 32.1 % — ABNORMAL LOW (ref 38.4–49.9)
HGB: 11 g/dL — ABNORMAL LOW (ref 13.0–17.1)
LYMPH%: 26.1 % (ref 14.0–49.0)
MCV: 102.6 fL — ABNORMAL HIGH (ref 79.3–98.0)
MONO#: 0.6 10*3/uL (ref 0.1–0.9)
MONO%: 11.9 % (ref 0.0–14.0)
NEUT#: 3.2 10*3/uL (ref 1.5–6.5)
NEUT%: 60.8 % (ref 39.0–75.0)
Platelets: 133 10*3/uL — ABNORMAL LOW (ref 140–400)
WBC: 5.2 10*3/uL (ref 4.0–10.3)
nRBC: 0 % (ref 0–0)

## 2012-12-05 LAB — COMPREHENSIVE METABOLIC PANEL (CC13)
ALT: 13 U/L (ref 0–55)
BUN: 14.9 mg/dL (ref 7.0–26.0)
CO2: 21 mEq/L — ABNORMAL LOW (ref 22–29)
Calcium: 8.8 mg/dL (ref 8.4–10.4)
Chloride: 114 mEq/L — ABNORMAL HIGH (ref 98–109)
Creatinine: 1.2 mg/dL (ref 0.7–1.3)
Glucose: 141 mg/dl — ABNORMAL HIGH (ref 70–140)

## 2012-12-05 MED ORDER — SODIUM CHLORIDE 0.9 % IV SOLN
500.0000 mg/m2 | Freq: Once | INTRAVENOUS | Status: AC
  Administered 2012-12-05: 875 mg via INTRAVENOUS
  Filled 2012-12-05: qty 35

## 2012-12-05 MED ORDER — SODIUM CHLORIDE 0.9 % IV SOLN
15.0000 mg/kg | Freq: Once | INTRAVENOUS | Status: AC
  Administered 2012-12-05: 975 mg via INTRAVENOUS
  Filled 2012-12-05: qty 39

## 2012-12-05 MED ORDER — ONDANSETRON 16 MG/50ML IVPB (CHCC)
INTRAVENOUS | Status: AC
  Filled 2012-12-05: qty 16

## 2012-12-05 MED ORDER — DEXAMETHASONE SODIUM PHOSPHATE 20 MG/5ML IJ SOLN
INTRAMUSCULAR | Status: AC
  Filled 2012-12-05: qty 5

## 2012-12-05 MED ORDER — CARBOPLATIN CHEMO INTRADERMAL TEST DOSE 100MCG/0.02ML
100.0000 ug | Freq: Once | INTRADERMAL | Status: AC
  Administered 2012-12-05: 100 ug via INTRADERMAL
  Filled 2012-12-05: qty 0.01

## 2012-12-05 MED ORDER — ONDANSETRON 16 MG/50ML IVPB (CHCC)
16.0000 mg | Freq: Once | INTRAVENOUS | Status: AC
  Administered 2012-12-05: 16 mg via INTRAVENOUS

## 2012-12-05 MED ORDER — DIPHENHYDRAMINE HCL 25 MG PO TABS
25.0000 mg | ORAL_TABLET | Freq: Once | ORAL | Status: AC
  Administered 2012-12-05: 25 mg via ORAL
  Filled 2012-12-05: qty 1

## 2012-12-05 MED ORDER — DEXAMETHASONE SODIUM PHOSPHATE 20 MG/5ML IJ SOLN
20.0000 mg | Freq: Once | INTRAMUSCULAR | Status: AC
  Administered 2012-12-05: 20 mg via INTRAVENOUS

## 2012-12-05 MED ORDER — SODIUM CHLORIDE 0.9 % IJ SOLN
10.0000 mL | INTRAMUSCULAR | Status: DC | PRN
  Administered 2012-12-05: 10 mL
  Filled 2012-12-05: qty 10

## 2012-12-05 MED ORDER — HEPARIN SOD (PORK) LOCK FLUSH 100 UNIT/ML IV SOLN
500.0000 [IU] | Freq: Once | INTRAVENOUS | Status: AC | PRN
  Administered 2012-12-05: 500 [IU]
  Filled 2012-12-05: qty 5

## 2012-12-05 MED ORDER — DIPHENHYDRAMINE HCL 25 MG PO CAPS
ORAL_CAPSULE | ORAL | Status: AC
  Filled 2012-12-05: qty 1

## 2012-12-05 MED ORDER — SODIUM CHLORIDE 0.9 % IV SOLN
Freq: Once | INTRAVENOUS | Status: AC
  Administered 2012-12-05: 12:00:00 via INTRAVENOUS

## 2012-12-05 NOTE — Progress Notes (Signed)
Carbo test dose positive after 5 minutes.  Site raised, itching and thin red streak up arm ~ 4 inches. Carbo held per Dr Arbutus Ped

## 2012-12-05 NOTE — Patient Instructions (Addendum)
[facility] Discharge Instructions for Patients Receiving Chemotherapy  Today you received the following chemotherapy agents Alimta Avastin  To help prevent nausea and vomiting after your treatment, we encourage you to take your nausea medication as needed   If you develop nausea and vomiting that is not controlled by your nausea medication, call the clinic.   BELOW ARE SYMPTOMS THAT SHOULD BE REPORTED IMMEDIATELY:  *FEVER GREATER THAN 100.5 F  *CHILLS WITH OR WITHOUT FEVER  NAUSEA AND VOMITING THAT IS NOT CONTROLLED WITH YOUR NAUSEA MEDICATION  *UNUSUAL SHORTNESS OF BREATH  *UNUSUAL BRUISING OR BLEEDING  TENDERNESS IN MOUTH AND THROAT WITH OR WITHOUT PRESENCE OF ULCERS  *URINARY PROBLEMS  *BOWEL PROBLEMS  UNUSUAL RASH Items with * indicate a potential emergency and should be followed up as soon as possible.  Feel free to call the clinic you have any questions or concerns. The clinic phone number is [phone].

## 2012-12-12 ENCOUNTER — Other Ambulatory Visit (HOSPITAL_BASED_OUTPATIENT_CLINIC_OR_DEPARTMENT_OTHER): Payer: Medicare Other | Admitting: Lab

## 2012-12-12 ENCOUNTER — Other Ambulatory Visit: Payer: Medicare Other

## 2012-12-12 DIAGNOSIS — C3491 Malignant neoplasm of unspecified part of right bronchus or lung: Secondary | ICD-10-CM

## 2012-12-12 DIAGNOSIS — C3490 Malignant neoplasm of unspecified part of unspecified bronchus or lung: Secondary | ICD-10-CM

## 2012-12-12 LAB — CBC WITH DIFFERENTIAL/PLATELET
BASO%: 1.3 % (ref 0.0–2.0)
EOS%: 1.9 % (ref 0.0–7.0)
LYMPH%: 33.8 % (ref 14.0–49.0)
MCH: 36.2 pg — ABNORMAL HIGH (ref 27.2–33.4)
MCHC: 35.1 g/dL (ref 32.0–36.0)
MCV: 103.2 fL — ABNORMAL HIGH (ref 79.3–98.0)
MONO%: 5.9 % (ref 0.0–14.0)
Platelets: 75 10*3/uL — ABNORMAL LOW (ref 140–400)
RBC: 2.74 10*6/uL — ABNORMAL LOW (ref 4.20–5.82)
WBC: 2.2 10*3/uL — ABNORMAL LOW (ref 4.0–10.3)

## 2012-12-12 LAB — COMPREHENSIVE METABOLIC PANEL (CC13)
ALT: 23 U/L (ref 0–55)
Alkaline Phosphatase: 65 U/L (ref 40–150)
Sodium: 139 mEq/L (ref 136–145)
Total Bilirubin: 1.76 mg/dL — ABNORMAL HIGH (ref 0.20–1.20)
Total Protein: 6 g/dL — ABNORMAL LOW (ref 6.4–8.3)

## 2012-12-19 ENCOUNTER — Other Ambulatory Visit (HOSPITAL_BASED_OUTPATIENT_CLINIC_OR_DEPARTMENT_OTHER): Payer: Medicare Other

## 2012-12-19 ENCOUNTER — Other Ambulatory Visit: Payer: Medicare Other | Admitting: Lab

## 2012-12-19 DIAGNOSIS — C3410 Malignant neoplasm of upper lobe, unspecified bronchus or lung: Secondary | ICD-10-CM

## 2012-12-19 DIAGNOSIS — C3491 Malignant neoplasm of unspecified part of right bronchus or lung: Secondary | ICD-10-CM

## 2012-12-19 LAB — COMPREHENSIVE METABOLIC PANEL (CC13)
ALT: 23 U/L (ref 0–55)
AST: 33 U/L (ref 5–34)
Albumin: 3.5 g/dL (ref 3.5–5.0)
BUN: 15.9 mg/dL (ref 7.0–26.0)
CO2: 19 mEq/L — ABNORMAL LOW (ref 22–29)
Calcium: 9.5 mg/dL (ref 8.4–10.4)
Chloride: 112 mEq/L — ABNORMAL HIGH (ref 98–109)
Potassium: 4 mEq/L (ref 3.5–5.1)

## 2012-12-19 LAB — TECHNOLOGIST REVIEW

## 2012-12-19 LAB — CBC WITH DIFFERENTIAL/PLATELET
BASO%: 0.1 % (ref 0.0–2.0)
EOS%: 2 % (ref 0.0–7.0)
Eosinophils Absolute: 0.1 10*3/uL (ref 0.0–0.5)
HCT: 28.2 % — ABNORMAL LOW (ref 38.4–49.9)
MCHC: 35.4 g/dL (ref 32.0–36.0)
MCV: 103.1 fL — ABNORMAL HIGH (ref 79.3–98.0)
NEUT#: 1.2 10*3/uL — ABNORMAL LOW (ref 1.5–6.5)
Platelets: 79 10*3/uL — ABNORMAL LOW (ref 140–400)
RBC: 2.74 10*6/uL — ABNORMAL LOW (ref 4.20–5.82)

## 2012-12-22 ENCOUNTER — Other Ambulatory Visit: Payer: Self-pay | Admitting: *Deleted

## 2012-12-22 MED ORDER — FOLIC ACID 1 MG PO TABS: 1.0000 mg | ORAL_TABLET | Freq: Every day | ORAL | Status: DC

## 2012-12-22 MED ORDER — FOLIVANE-PLUS PO CAPS: 1.0000 | ORAL_CAPSULE | Freq: Every day | ORAL | Status: DC

## 2012-12-26 ENCOUNTER — Ambulatory Visit: Payer: Medicare Other

## 2012-12-26 ENCOUNTER — Other Ambulatory Visit: Payer: Medicare Other | Admitting: Lab

## 2012-12-26 ENCOUNTER — Ambulatory Visit: Payer: Medicare Other | Admitting: Internal Medicine

## 2012-12-28 ENCOUNTER — Ambulatory Visit (HOSPITAL_BASED_OUTPATIENT_CLINIC_OR_DEPARTMENT_OTHER): Payer: Medicare Other

## 2012-12-28 ENCOUNTER — Telehealth: Payer: Self-pay | Admitting: *Deleted

## 2012-12-28 ENCOUNTER — Encounter: Payer: Self-pay | Admitting: Internal Medicine

## 2012-12-28 ENCOUNTER — Ambulatory Visit (HOSPITAL_BASED_OUTPATIENT_CLINIC_OR_DEPARTMENT_OTHER): Payer: Medicare Other | Admitting: Internal Medicine

## 2012-12-28 ENCOUNTER — Telehealth: Payer: Self-pay | Admitting: Medical Oncology

## 2012-12-28 ENCOUNTER — Other Ambulatory Visit (HOSPITAL_BASED_OUTPATIENT_CLINIC_OR_DEPARTMENT_OTHER): Payer: Medicare Other

## 2012-12-28 DIAGNOSIS — Z5111 Encounter for antineoplastic chemotherapy: Secondary | ICD-10-CM

## 2012-12-28 DIAGNOSIS — C3410 Malignant neoplasm of upper lobe, unspecified bronchus or lung: Secondary | ICD-10-CM

## 2012-12-28 DIAGNOSIS — C3491 Malignant neoplasm of unspecified part of right bronchus or lung: Secondary | ICD-10-CM

## 2012-12-28 DIAGNOSIS — C7A090 Malignant carcinoid tumor of the bronchus and lung: Secondary | ICD-10-CM

## 2012-12-28 DIAGNOSIS — Z5112 Encounter for antineoplastic immunotherapy: Secondary | ICD-10-CM

## 2012-12-28 LAB — CBC WITH DIFFERENTIAL/PLATELET
BASO%: 0.2 % (ref 0.0–2.0)
EOS%: 2.9 % (ref 0.0–7.0)
LYMPH%: 27.3 % (ref 14.0–49.0)
MCHC: 33.5 g/dL (ref 32.0–36.0)
MCV: 106.9 fL — ABNORMAL HIGH (ref 79.3–98.0)
MONO%: 14.8 % — ABNORMAL HIGH (ref 0.0–14.0)
Platelets: 151 10*3/uL (ref 140–400)
RBC: 3.04 10*6/uL — ABNORMAL LOW (ref 4.20–5.82)
nRBC: 0 % (ref 0–0)

## 2012-12-28 LAB — COMPREHENSIVE METABOLIC PANEL (CC13)
ALT: 23 U/L (ref 0–55)
AST: 36 U/L — ABNORMAL HIGH (ref 5–34)
Alkaline Phosphatase: 73 U/L (ref 40–150)
CO2: 20 mEq/L — ABNORMAL LOW (ref 22–29)
Calcium: 9.5 mg/dL (ref 8.4–10.4)
Chloride: 112 mEq/L — ABNORMAL HIGH (ref 98–109)
Creatinine: 1.5 mg/dL — ABNORMAL HIGH (ref 0.7–1.3)
Sodium: 142 mEq/L (ref 136–145)

## 2012-12-28 MED ORDER — SODIUM CHLORIDE 0.9 % IV SOLN
Freq: Once | INTRAVENOUS | Status: AC
  Administered 2012-12-28: 11:00:00 via INTRAVENOUS

## 2012-12-28 MED ORDER — CYANOCOBALAMIN 1000 MCG/ML IJ SOLN
INTRAMUSCULAR | Status: AC
  Filled 2012-12-28: qty 1

## 2012-12-28 MED ORDER — SODIUM CHLORIDE 0.9 % IJ SOLN
10.0000 mL | INTRAMUSCULAR | Status: DC | PRN
  Administered 2012-12-28: 10 mL
  Filled 2012-12-28: qty 10

## 2012-12-28 MED ORDER — SODIUM CHLORIDE 0.9 % IV SOLN
500.0000 mg/m2 | Freq: Once | INTRAVENOUS | Status: AC
  Administered 2012-12-28: 875 mg via INTRAVENOUS
  Filled 2012-12-28: qty 35

## 2012-12-28 MED ORDER — DEXAMETHASONE SODIUM PHOSPHATE 20 MG/5ML IJ SOLN
20.0000 mg | Freq: Once | INTRAMUSCULAR | Status: AC
  Administered 2012-12-28: 20 mg via INTRAVENOUS

## 2012-12-28 MED ORDER — BEVACIZUMAB CHEMO INJECTION 400 MG/16ML
15.0000 mg/kg | Freq: Once | INTRAVENOUS | Status: AC
  Administered 2012-12-28: 975 mg via INTRAVENOUS
  Filled 2012-12-28: qty 39

## 2012-12-28 MED ORDER — CYANOCOBALAMIN 1000 MCG/ML IJ SOLN: 1000.0000 ug | Freq: Once | INTRAMUSCULAR | Status: DC

## 2012-12-28 MED ORDER — HEPARIN SOD (PORK) LOCK FLUSH 100 UNIT/ML IV SOLN
500.0000 [IU] | Freq: Once | INTRAVENOUS | Status: AC | PRN
  Administered 2012-12-28: 500 [IU]
  Filled 2012-12-28: qty 5

## 2012-12-28 MED ORDER — ONDANSETRON 16 MG/50ML IVPB (CHCC)
16.0000 mg | Freq: Once | INTRAVENOUS | Status: AC
  Administered 2012-12-28: 16 mg via INTRAVENOUS

## 2012-12-28 MED ORDER — ONDANSETRON 16 MG/50ML IVPB (CHCC)
INTRAVENOUS | Status: AC
  Filled 2012-12-28: qty 16

## 2012-12-28 MED ORDER — DEXAMETHASONE SODIUM PHOSPHATE 20 MG/5ML IJ SOLN
INTRAMUSCULAR | Status: AC
  Filled 2012-12-28: qty 5

## 2012-12-28 NOTE — Telephone Encounter (Signed)
Per staff message and POF I have scheduled appts.  JMW  

## 2012-12-28 NOTE — Patient Instructions (Signed)
[facility] Discharge Instructions for Patients Receiving Chemotherapy  Today you received the following chemotherapy agents:  Avastin and Alimta  To help prevent nausea and vomiting after your treatment, we encourage you to take your nausea medication as ordered per MD.   If you develop nausea and vomiting that is not controlled by your nausea medication, call the clinic.   BELOW ARE SYMPTOMS THAT SHOULD BE REPORTED IMMEDIATELY:  *FEVER GREATER THAN 100.5 F  *CHILLS WITH OR WITHOUT FEVER  NAUSEA AND VOMITING THAT IS NOT CONTROLLED WITH YOUR NAUSEA MEDICATION  *UNUSUAL SHORTNESS OF BREATH  *UNUSUAL BRUISING OR BLEEDING  TENDERNESS IN MOUTH AND THROAT WITH OR WITHOUT PRESENCE OF ULCERS  *URINARY PROBLEMS  *BOWEL PROBLEMS  UNUSUAL RASH Items with * indicate a potential emergency and should be followed up as soon as possible.  Feel free to call the clinic you have any questions or concerns. The clinic phone number is [phone].    

## 2012-12-28 NOTE — Progress Notes (Signed)
[facility] [phone]   Fax:([phone]  OFFICE PROGRESS NOTE  [name], MD [address] [place] [address]  DIAGNOSIS AND DIAGNOSIS: Metastatic non-small cell lung cancer adenocarcinoma diagnosed in September 2010   PRIOR THERAPY:  #1 status post 6 cycles of systemic chemotherapy with carboplatin, Alimta and Avastin given every 3 weeks last dose given 04/23/2009 with disease stabilization.  #2 status post palliative radiotherapy to the mediastinum under the care of Dr. Michell Heinrich. The patient received a total dose of 3000 cGY and 12 fractions completed 01/21/2009.  #3 Maintenance chemotherapy with Alimta at 500 mg per meter squared and Avastin at 15 mg per kilogram given every 3 weeks status post 35 cycles.  #4 Maintenance chemotherapy with Alimta 500 mg/M2 and Avastin 15 mg/kg every 4 weeks,status post 17 cycles, discontinued secondary to disease progression.   CURRENT THERAPY: Systemic chemotherapy with carboplatin for AUC of 5, Alimta 500 mg/M2 and Avastin 15 mg/kg every 3 weeks, status post 2 cycles. Carboplatin was discontinued starting cycle #2 secondary to hypersensitivity reaction  CHEMOTHERAPY INTENT: palliative  CURRENT # OF CHEMOTHERAPY CYCLES: 3 CURRENT ANTIEMETICS: Compazine when necessary but clearly used.  CURRENT SMOKING STATUS: current nonsmoker  ORAL CHEMOTHERAPY AND CONSENT: None.  CURRENT BISPHOSPHONATES USE: None.  PAIN MANAGEMENT: No Pain  NARCOTICS INDUCED CONSTIPATION: N/A  LIVING WILL AND CODE STATUS: NCB   INTERVAL HISTORY: Dannel Rafter 67 y.o. male returns to the clinic today for follow up visit. The patient is feeling fine today with no specific complaints except for mild fatigue. He tolerated the last cycle of his treatment with Alimta and Avastin fairly well with no significant adverse effects. The patient denied having any fever or chills. He has no nausea or vomiting. He denied having any  significant chest pain, shortness breath, cough or hemoptysis. He developed hypersensitivity reaction to the test dose of carboplatin with cycle #2 of his chemotherapy and carboplatin was discontinued at that time.  MEDICAL HISTORY: Past Medical History  Diagnosis Date  . Hypertension   . Arthritis RIGHT SHOULDER  . Immature cataract BILATERAL  . Non-small cell lung cancer DX SEPT 2010  W/ CHEMORADIATION AT THAT TIME -- NOW  W/ METS--  CURRENTLY ON MAINTENANCE CHEMO TX  EVERY 30 DAYS    ONCOLOGIST- DR [facility]  . Acute meniscal tear of knee RIGHT KNEE  . BPH (benign prostatic hypertrophy)     ALLERGIES:  is allergic to carboplatin; percocet; and zolpidem tartrate.  MEDICATIONS:  Current Outpatient Prescriptions  Medication Sig Dispense Refill  . amLODipine (NORVASC) 2.5 MG tablet Take 2.5 mg by mouth every morning.       . Ascorbic Acid (VITAMIN C) 1000 MG tablet Take 1,000 mg by mouth daily.      Marland Kitchen atenolol (TENORMIN) 100 MG tablet Take 100 mg by mouth every morning.       . B Complex-Biotin-FA (B-COMPLEX PO) Take 1 tablet by mouth daily. 1500mg  daily      . doxazosin (CARDURA) 2 MG tablet Take 2 mg by mouth at bedtime.       . FeFum-FePoly-FA-B Cmp-C-Biot (FOLIVANE-PLUS) CAPS Take 1 capsule by mouth daily.  90 capsule  3  . fexofenadine (ALLEGRA) 180 MG tablet Take 180 mg by mouth daily as needed. Allergies      . folic acid (FOLVITE) 1 MG tablet Take 1 tablet (1 mg total) by mouth daily.  90 tablet  1  . Multiple Vitamin (MULTIVITAMIN)  tablet Take 1 tablet by mouth daily.       Marland Kitchen omeprazole (PRILOSEC) 40 MG capsule Take 40 mg by mouth every evening.      Bernadette Hoit Sodium (SENOKOT S PO) Take 2 tablets by mouth daily. 2 tabs daily      . HYDROcodone-homatropine (HYCODAN) 5-1.5 MG/5ML syrup Take 5 mLs by mouth every 6 (six) hours as needed for cough.      . prochlorperazine (COMPAZINE) 10 MG tablet Take 1 tablet (10 mg total) by mouth every 6 (six) hours as needed.  30  tablet  0   No current facility-administered medications for this visit.    SURGICAL HISTORY:  Past Surgical History  Procedure Laterality Date  . 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 Rotator cuff repair  2008    LEFT SHOULDER  . Bilateral ear drum surgery  1960'S  . Orif left ring finger  and revascularization of radial side  02-16-2007    DEGLOVING INJURY  . Knee arthroscopy  12/21/2011    Procedure: ARTHROSCOPY KNEE;  Surgeon: Jacki Cones, MD;  Location: [facility];  Service: Orthopedics;  Laterality: Right;  WITH MEDIAL MENISECTOMY    REVIEW OF SYSTEMS:  A comprehensive review of systems was negative except for: Constitutional: positive for fatigue   PHYSICAL EXAMINATION: General appearance: alert, cooperative, fatigued and no distress Head: Normocephalic, without obvious abnormality, atraumatic Neck: no adenopathy, no JVD, supple, symmetrical, trachea midline and thyroid not enlarged, symmetric, no tenderness/mass/nodules Lymph nodes: Cervical, supraclavicular, and axillary nodes normal. Resp: clear to auscultation bilaterally Back: symmetric, no curvature. ROM normal. No CVA tenderness. Cardio: regular rate and rhythm, S1, S2 normal, no murmur, click, rub or gallop GI: soft, non-tender; bowel sounds normal; no masses,  no organomegaly Extremities: extremities normal, atraumatic, no cyanosis or edema  ECOG PERFORMANCE STATUS: 1 - Symptomatic but completely ambulatory  Blood pressure 143/90, pulse 53, temperature 97.6 F (36.4 C), temperature source Oral, resp. rate 18, height 5\' 7"  (1.702 m), weight 135 lb 14.4 oz (61.644 kg), SpO2 100.00%.  LABORATORY DATA: Lab Results  Component Value Date   WBC 5.6 12/28/2012   HGB 10.9* 12/28/2012   HCT 32.5* 12/28/2012   MCV 106.9* 12/28/2012   PLT 151 12/28/2012      Chemistry      Component Value Date/Time   NA 141 12/19/2012 0901   NA  140 12/21/2011 1252   K 4.0 12/19/2012 0901   K 3.3* 12/21/2011 1252   CL 109* 08/29/2012 0856   CL 101 11/18/2011 0858   CO2 19* 12/19/2012 0901   CO2 22 11/18/2011 0858   BUN 15.9 12/19/2012 0901   BUN 19 11/18/2011 0858   CREATININE 1.6* 12/19/2012 0901   CREATININE 1.34 11/18/2011 0858      Component Value Date/Time   CALCIUM 9.5 12/19/2012 0901   CALCIUM 9.5 11/18/2011 0858   ALKPHOS 78 12/19/2012 0901   ALKPHOS 55 11/18/2011 0858   AST 33 12/19/2012 0901   AST 25 11/18/2011 0858   ALT 23 12/19/2012 0901   ALT 17 11/18/2011 0858   BILITOT 0.78 12/19/2012 0901   BILITOT 0.5 11/18/2011 0858       RADIOGRAPHIC STUDIES: No results found.  ASSESSMENT AND PLAN: this is a very pleasant 67 years old white male with metastatic non-small cell lung cancer, adenocarcinoma currently undergoing systemic chemotherapy with Alimta and Avastin status post 2 cycles.  The patient tolerated the last cycle of his treatment fairly well. We'll proceed with cycle #3 today as scheduled. The patient would come back for follow up visit in 3 weeks with repeat CT scan of the chest, abdomen and pelvis for restaging of his disease. He was advised to call immediately if he has any concerning symptoms in the interval. The patient voices understanding of current disease status and treatment options and is in agreement with the current care plan.  All questions were answered. The patient knows to call the clinic with any problems, questions or concerns. We can certainly see the patient much sooner if necessary.

## 2012-12-29 ENCOUNTER — Telehealth: Payer: Self-pay | Admitting: *Deleted

## 2012-12-29 NOTE — Telephone Encounter (Signed)
Per staff message I have moved appts to 10/28.

## 2012-12-30 ENCOUNTER — Encounter: Payer: Self-pay | Admitting: Internal Medicine

## 2012-12-30 NOTE — Patient Instructions (Signed)
Followup visit in 3 weeks with repeat CT scan of the chest, abdomen and pelvis. 

## 2013-01-16 ENCOUNTER — Ambulatory Visit (HOSPITAL_COMMUNITY)
Admission: RE | Admit: 2013-01-16 | Discharge: 2013-01-16 | Disposition: A | Discharge status: Home / Self Care | Payer: Medicare Other | Source: Ambulatory Visit | Attending: Internal Medicine | Admitting: Internal Medicine

## 2013-01-16 ENCOUNTER — Encounter (HOSPITAL_COMMUNITY): Payer: Self-pay

## 2013-01-16 DIAGNOSIS — J438 Other emphysema: Secondary | ICD-10-CM | POA: Insufficient documentation

## 2013-01-16 DIAGNOSIS — Z79899 Other long term (current) drug therapy: Secondary | ICD-10-CM | POA: Insufficient documentation

## 2013-01-16 DIAGNOSIS — Z923 Personal history of irradiation: Secondary | ICD-10-CM | POA: Insufficient documentation

## 2013-01-16 DIAGNOSIS — I2510 Atherosclerotic heart disease of native coronary artery without angina pectoris: Secondary | ICD-10-CM | POA: Insufficient documentation

## 2013-01-16 DIAGNOSIS — I700 Atherosclerosis of aorta: Secondary | ICD-10-CM | POA: Insufficient documentation

## 2013-01-16 DIAGNOSIS — C3490 Malignant neoplasm of unspecified part of unspecified bronchus or lung: Secondary | ICD-10-CM | POA: Insufficient documentation

## 2013-01-16 DIAGNOSIS — N289 Disorder of kidney and ureter, unspecified: Secondary | ICD-10-CM | POA: Insufficient documentation

## 2013-01-16 MED ORDER — IOHEXOL 300 MG/ML  SOLN
100.0000 mL | Freq: Once | INTRAMUSCULAR | Status: AC | PRN
  Administered 2013-01-16: 100 mL via INTRAVENOUS

## 2013-01-18 ENCOUNTER — Ambulatory Visit: Payer: Medicare Other

## 2013-01-18 ENCOUNTER — Ambulatory Visit: Payer: Medicare Other | Admitting: Internal Medicine

## 2013-01-23 ENCOUNTER — Other Ambulatory Visit (HOSPITAL_BASED_OUTPATIENT_CLINIC_OR_DEPARTMENT_OTHER): Payer: Medicare Other | Admitting: Lab

## 2013-01-23 ENCOUNTER — Ambulatory Visit (HOSPITAL_BASED_OUTPATIENT_CLINIC_OR_DEPARTMENT_OTHER): Payer: Medicare Other

## 2013-01-23 ENCOUNTER — Encounter: Payer: Self-pay | Admitting: Internal Medicine

## 2013-01-23 ENCOUNTER — Ambulatory Visit (HOSPITAL_BASED_OUTPATIENT_CLINIC_OR_DEPARTMENT_OTHER): Payer: Medicare Other | Admitting: Internal Medicine

## 2013-01-23 ENCOUNTER — Telehealth: Payer: Self-pay | Admitting: *Deleted

## 2013-01-23 ENCOUNTER — Telehealth: Payer: Self-pay | Admitting: Internal Medicine

## 2013-01-23 DIAGNOSIS — Z5112 Encounter for antineoplastic immunotherapy: Secondary | ICD-10-CM

## 2013-01-23 DIAGNOSIS — C3410 Malignant neoplasm of upper lobe, unspecified bronchus or lung: Secondary | ICD-10-CM

## 2013-01-23 DIAGNOSIS — Z5111 Encounter for antineoplastic chemotherapy: Secondary | ICD-10-CM

## 2013-01-23 LAB — CBC WITH DIFFERENTIAL/PLATELET
BASO%: 0.5 % (ref 0.0–2.0)
Basophils Absolute: 0 10*3/uL (ref 0.0–0.1)
EOS%: 2 % (ref 0.0–7.0)
Eosinophils Absolute: 0.1 10*3/uL (ref 0.0–0.5)
HGB: 10.5 g/dL — ABNORMAL LOW (ref 13.0–17.1)
LYMPH%: 24.2 % (ref 14.0–49.0)
MCH: 36.3 pg — ABNORMAL HIGH (ref 27.2–33.4)
MCV: 110 fL — ABNORMAL HIGH (ref 79.3–98.0)
MONO%: 12.1 % (ref 0.0–14.0)
NEUT#: 3.7 10*3/uL (ref 1.5–6.5)
RBC: 2.89 10*6/uL — ABNORMAL LOW (ref 4.20–5.82)
RDW: 18 % — ABNORMAL HIGH (ref 11.0–14.6)
lymph#: 1.4 10*3/uL (ref 0.9–3.3)
nRBC: 0 % (ref 0–0)

## 2013-01-23 LAB — COMPREHENSIVE METABOLIC PANEL (CC13)
ALT: 17 U/L (ref 0–55)
AST: 28 U/L (ref 5–34)
Albumin: 3.4 g/dL — ABNORMAL LOW (ref 3.5–5.0)
Alkaline Phosphatase: 70 U/L (ref 40–150)
Anion Gap: 9 mEq/L (ref 3–11)
BUN: 17.9 mg/dL (ref 7.0–26.0)
CO2: 23 mEq/L (ref 22–29)
Creatinine: 1.5 mg/dL — ABNORMAL HIGH (ref 0.7–1.3)
Glucose: 88 mg/dl (ref 70–140)
Potassium: 4.4 mEq/L (ref 3.5–5.1)
Sodium: 142 mEq/L (ref 136–145)
Total Bilirubin: 0.53 mg/dL (ref 0.20–1.20)
Total Protein: 6.5 g/dL (ref 6.4–8.3)

## 2013-01-23 MED ORDER — SODIUM CHLORIDE 0.9 % IV SOLN
500.0000 mg/m2 | Freq: Once | INTRAVENOUS | Status: AC
  Administered 2013-01-23: 850 mg via INTRAVENOUS
  Filled 2013-01-23: qty 34

## 2013-01-23 MED ORDER — DEXAMETHASONE SODIUM PHOSPHATE 10 MG/ML IJ SOLN
INTRAMUSCULAR | Status: AC
  Filled 2013-01-23: qty 1

## 2013-01-23 MED ORDER — DEXAMETHASONE SODIUM PHOSPHATE 10 MG/ML IJ SOLN
10.0000 mg | Freq: Once | INTRAMUSCULAR | Status: AC
  Administered 2013-01-23: 10 mg via INTRAVENOUS

## 2013-01-23 MED ORDER — SODIUM CHLORIDE 0.9 % IV SOLN
15.0000 mg/kg | Freq: Once | INTRAVENOUS | Status: AC
  Administered 2013-01-23: 975 mg via INTRAVENOUS
  Filled 2013-01-23: qty 39

## 2013-01-23 MED ORDER — ONDANSETRON 8 MG/50ML IVPB (CHCC)
8.0000 mg | Freq: Once | INTRAVENOUS | Status: AC
  Administered 2013-01-23: 8 mg via INTRAVENOUS

## 2013-01-23 MED ORDER — ONDANSETRON 8 MG/NS 50 ML IVPB
INTRAVENOUS | Status: AC
  Filled 2013-01-23: qty 8

## 2013-01-23 NOTE — Telephone Encounter (Signed)
Per staff message and POF I have scheduled appts.  JMW  

## 2013-01-23 NOTE — Progress Notes (Signed)
[facility] [phone]   [phone]  OFFICE PROGRESS NOTE  [name], MD [address] [place] [address]  DIAGNOSIS AND DIAGNOSIS: Metastatic non-small cell lung cancer adenocarcinoma diagnosed in September 2010   PRIOR THERAPY:  #1 status post 6 cycles of systemic chemotherapy with carboplatin, Alimta and Avastin given every 3 weeks last dose given 04/23/2009 with disease stabilization.  #2 status post palliative radiotherapy to the mediastinum under the care of Dr. Michell Heinrich. The patient received a total dose of 3000 cGY and 12 fractions completed 01/21/2009.  #3 Maintenance chemotherapy with Alimta at 500 mg per meter squared and Avastin at 15 mg per kilogram given every 3 weeks status post 35 cycles.  #4 Maintenance chemotherapy with Alimta 500 mg/M2 and Avastin 15 mg/kg every 4 weeks,status post 17 cycles, discontinued secondary to disease progression.  #5 Systemic chemotherapy with carboplatin for AUC of 5, Alimta 500 mg/M2 and Avastin 15 mg/kg every 3 weeks, status post 3 cycles, last cycle was given 12/28/2012. Carboplatin was discontinued starting cycle #2 secondary to hypersensitivity reaction.   CURRENT THERAPY: Maintenance chemotherapy again with Alimta 500 mg/M2 and Avastin 15 mg/kg every 3 weeks, first cycle 01/23/2013.  CHEMOTHERAPY INTENT: palliative/maintenance.  CURRENT # OF CHEMOTHERAPY CYCLES: 1  CURRENT ANTIEMETICS: Compazine when necessary but clearly used.  CURRENT SMOKING STATUS: current nonsmoker  ORAL CHEMOTHERAPY AND CONSENT: None.  CURRENT BISPHOSPHONATES USE: None.  PAIN MANAGEMENT: No Pain  NARCOTICS INDUCED CONSTIPATION: N/A  LIVING WILL AND CODE STATUS: NCB  INTERVAL HISTORY:  Davit Vassar 67 y.o. male returns to the clinic today for follow up visit. The patient is feeling fine today with no specific complaints except for mild fatigue. He tolerated the last cycle of his treatment with Alimta  and Avastin fairly well with no significant adverse effects. The patient denied having any fever or chills. He has no nausea or vomiting. He denied having any significant chest pain, shortness breath, cough or hemoptysis. He developed hypersensitivity reaction to the test dose of carboplatin with cycle #2 of his chemotherapy and carboplatin was discontinued at that time. The patient had repeat CT scan of the chest, abdomen and pelvis performed recently and he is here for evaluation and discussion of his scan results.   MEDICAL HISTORY: Past Medical History  Diagnosis Date  . Hypertension   . Arthritis RIGHT SHOULDER  . Immature cataract BILATERAL  . Non-small cell lung cancer DX SEPT 2010  W/ CHEMORADIATION AT THAT TIME -- NOW  W/ METS--  CURRENTLY ON MAINTENANCE CHEMO TX  EVERY 30 DAYS    ONCOLOGIST- DR [facility]  . Acute meniscal tear of knee RIGHT KNEE  . BPH (benign prostatic hypertrophy)     ALLERGIES:  is allergic to carboplatin; percocet; and zolpidem tartrate.  MEDICATIONS:  Current Outpatient Prescriptions  Medication Sig Dispense Refill  . amLODipine (NORVASC) 2.5 MG tablet Take 2.5 mg by mouth every morning.       . Ascorbic Acid (VITAMIN C) 1000 MG tablet Take 1,000 mg by mouth daily.      Marland Kitchen atenolol (TENORMIN) 100 MG tablet Take 100 mg by mouth every morning.       . B Complex-Biotin-FA (B-COMPLEX PO) Take 1 tablet by mouth daily. 1500mg  daily      . doxazosin (CARDURA) 2 MG tablet Take 2 mg by mouth at bedtime.       . FeFum-FePoly-FA-B Cmp-C-Biot (FOLIVANE-PLUS) CAPS Take 1 capsule by mouth daily.  90 capsule  3  . fexofenadine (ALLEGRA) 180 MG tablet Take 180 mg by mouth daily as needed. Allergies      . folic acid (FOLVITE) 1 MG tablet Take 1 tablet (1 mg total) by mouth daily.  90 tablet  1  . Multiple Vitamin (MULTIVITAMIN) tablet Take 1 tablet by mouth daily.       Marland Kitchen omeprazole (PRILOSEC) 40 MG capsule Take 40 mg by mouth every evening.      Bernadette Hoit  Sodium (SENOKOT S PO) Take 2 tablets by mouth daily. 2 tabs daily      . HYDROcodone-homatropine (HYCODAN) 5-1.5 MG/5ML syrup Take 5 mLs by mouth every 6 (six) hours as needed for cough.      . prochlorperazine (COMPAZINE) 10 MG tablet Take 1 tablet (10 mg total) by mouth every 6 (six) hours as needed.  30 tablet  0   No current facility-administered medications for this visit.    SURGICAL HISTORY:  Past Surgical History  Procedure Laterality Date  . 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 Rotator cuff repair  2008    LEFT SHOULDER  . Bilateral ear drum surgery  1960'S  . Orif left ring finger  and revascularization of radial side  02-16-2007    DEGLOVING INJURY  . Knee arthroscopy  12/21/2011    Procedure: ARTHROSCOPY KNEE;  Surgeon: Jacki Cones, MD;  Location: [facility];  Service: Orthopedics;  Laterality: Right;  WITH MEDIAL MENISECTOMY    REVIEW OF SYSTEMS:  Constitutional: positive for fatigue Eyes: negative Ears, nose, mouth, throat, and face: negative Respiratory: negative Cardiovascular: negative Gastrointestinal: negative Genitourinary:negative Integument/breast: negative Hematologic/lymphatic: negative Musculoskeletal:negative Neurological: negative Behavioral/Psych: negative Endocrine: negative Allergic/Immunologic: negative   PHYSICAL EXAMINATION: General appearance: alert, cooperative, fatigued and no distress Head: Normocephalic, without obvious abnormality, atraumatic Neck: no adenopathy, no JVD, supple, symmetrical, trachea midline and thyroid not enlarged, symmetric, no tenderness/mass/nodules Lymph nodes: Cervical, supraclavicular, and axillary nodes normal. Resp: clear to auscultation bilaterally Back: symmetric, no curvature. ROM normal. No CVA tenderness. Cardio: regular rate and rhythm, S1, S2 normal, no murmur, click, rub or gallop GI: soft, non-tender; bowel  sounds normal; no masses,  no organomegaly Extremities: extremities normal, atraumatic, no cyanosis or edema Neurologic: Alert and oriented X 3, normal strength and tone. Normal symmetric reflexes. Normal coordination and gait  ECOG PERFORMANCE STATUS: 1 - Symptomatic but completely ambulatory  Blood pressure 144/83, pulse 57, temperature 97.4 F (36.3 C), temperature source Oral, resp. rate 18, weight 138 lb 6.4 oz (62.778 kg).  LABORATORY DATA: Lab Results  Component Value Date   WBC 6.0 01/23/2013   HGB 10.5* 01/23/2013   HCT 31.8* 01/23/2013   MCV 110.0* 01/23/2013   PLT 114* 01/23/2013      Chemistry      Component Value Date/Time   NA 142 12/28/2012 0940   NA 140 12/21/2011 1252   K 4.5 12/28/2012 0940   K 3.3* 12/21/2011 1252   CL 109* 08/29/2012 0856   CL 101 11/18/2011 0858   CO2 20* 12/28/2012 0940   CO2 22 11/18/2011 0858   BUN 23.2 12/28/2012 0940   BUN 19 11/18/2011 0858   CREATININE 1.5* 12/28/2012 0940   CREATININE 1.34 11/18/2011 0858      Component Value Date/Time   CALCIUM 9.5 12/28/2012 0940   CALCIUM 9.5 11/18/2011 0858   ALKPHOS 73 12/28/2012 0940   ALKPHOS 55 11/18/2011  0858   AST 36* 12/28/2012 0940   AST 25 11/18/2011 0858   ALT 23 12/28/2012 0940   ALT 17 11/18/2011 0858   BILITOT 1.01 12/28/2012 0940   BILITOT 0.5 11/18/2011 0858       RADIOGRAPHIC STUDIES: Ct Chest W Contrast  01/16/2013   CLINICAL DATA:  History of lung cancer status post chemotherapy and radiation therapy. Currently on chemotherapy.  EXAM: CT CHEST, ABDOMEN, AND PELVIS WITH CONTRAST  TECHNIQUE: Multidetector CT imaging of the chest, abdomen and pelvis was performed following the standard protocol during bolus administration of intravenous contrast.  CONTRAST:  OMNIPAQUE IOHEXOL 300 MG/ML  SOLN  COMPARISON:  CT of the chest, abdomen and pelvis 10/26/2012.  FINDINGS: CT CHEST FINDINGS  Mediastinum: Heart size is normal. Small amount of anterior pericardial fluid and or thickening,  unchanged compared to the prior study, and unlikely to be of any hemodynamic significance at this time. No associated pericardial calcification. There is atherosclerosis of the thoracic aorta, the great vessels of the mediastinum and the coronary arteries, including calcified atherosclerotic plaque in the left main, left anterior descending, left circumflex and right coronary arteries. No pathologically enlarged mediastinal or hilar lymph nodes. There are numerous nonenlarged mediastinal lymph nodes which are similar to the prior examination, and are nonspecific. Esophagus is unremarkable in appearance. Right internal jugular single-lumen porta cath with tip terminating at the superior cavoatrial junction.  Lungs/Pleura: Chronic volume loss and architectural distortion is again noted in the paramediastinal aspect of the right upper lobe where there is also a dystrophic calcification, related to prior mediastinal radiation therapy. No definite suspicious appearing pulmonary nodule or mass is identified on today's examination. No acute consolidative airspace disease. Mild paraseptal emphysema. Bilateral apical pleuroparenchymal thickening, similar to prior studies, compatible with chronic post infectious or inflammatory scarring.  Musculoskeletal: There are no aggressive appearing lytic or blastic lesions noted in the visualized portions of the skeleton.  CT ABDOMEN AND PELVIS FINDINGS  Abdomen/Pelvis: The appearance of the liver, gallbladder, pancreas, spleen and bilateral adrenal glands is unremarkable. There is extensive bilateral perinephric stranding which is nonspecific. Mild parenchymal atrophy in both of the kidneys. Left-sided extra renal pelvis again noted. There appears to be some enhancement of the urothelium in the left extra renal pelvis, without a definite new enhancing mural nodule. No hydroureteronephrosis. 1.2 cm low-attenuation nonenhancing lesion in the interpolar region of the left kidney  anteriorly is compatible with a small simple cyst.  Extensive atherosclerosis throughout the abdominal and pelvic vasculature, including a small ulcerated plaque in the distal abdominal aorta (image 86 of series 2) which is unchanged. No significant volume of ascites. No pneumoperitoneum. No pathologic distention of small bowel. No definite lymphadenopathy in the abdomen or pelvis. Urinary bladder is unremarkable in appearance. Prostate calcifications (nonspecific).  Musculoskeletal: There are no aggressive appearing lytic or blastic lesions noted in the visualized portions of the skeleton.  IMPRESSION: 1. No signs of residual or recurrent disease in the thorax, and no signs of metastatic disease to the abdomen or pelvis. 2. Extensive bilateral perinephric stranding is again noted, but is nonspecific. There does appear to be some avid enhancement of the left urothelium in the patient's extra renal pelvis. This is of uncertain etiology and significance. Correlation with urinalysis and urine cytology may be warranted. 3. Atherosclerosis, including left main and 3 vessel coronary artery disease. Please note that although the presence of coronary artery calcium documents the presence of coronary artery disease, the severity of this disease  and any potential stenosis cannot be assessed on this non-gated CT examination. Assessment for potential risk factor modification, dietary therapy or pharmacologic therapy may be warranted, if clinically indicated. 4. Small amount of pericardial fluid and or thickening, unchanged. This is unlikely to be of hemodynamic significance at this time. 5. Additional incidental findings, as above.   Electronically Signed   By: Trudie Reed M.D.   On: 01/16/2013 11:29   Ct Abdomen Pelvis W Contrast  01/16/2013   CLINICAL DATA:  History of lung cancer status post chemotherapy and radiation therapy. Currently on chemotherapy.  EXAM: CT CHEST, ABDOMEN, AND PELVIS WITH CONTRAST  TECHNIQUE:  Multidetector CT imaging of the chest, abdomen and pelvis was performed following the standard protocol during bolus administration of intravenous contrast.  CONTRAST:  OMNIPAQUE IOHEXOL 300 MG/ML  SOLN  COMPARISON:  CT of the chest, abdomen and pelvis 10/26/2012.  FINDINGS: CT CHEST FINDINGS  Mediastinum: Heart size is normal. Small amount of anterior pericardial fluid and or thickening, unchanged compared to the prior study, and unlikely to be of any hemodynamic significance at this time. No associated pericardial calcification. There is atherosclerosis of the thoracic aorta, the great vessels of the mediastinum and the coronary arteries, including calcified atherosclerotic plaque in the left main, left anterior descending, left circumflex and right coronary arteries. No pathologically enlarged mediastinal or hilar lymph nodes. There are numerous nonenlarged mediastinal lymph nodes which are similar to the prior examination, and are nonspecific. Esophagus is unremarkable in appearance. Right internal jugular single-lumen porta cath with tip terminating at the superior cavoatrial junction.  Lungs/Pleura: Chronic volume loss and architectural distortion is again noted in the paramediastinal aspect of the right upper lobe where there is also a dystrophic calcification, related to prior mediastinal radiation therapy. No definite suspicious appearing pulmonary nodule or mass is identified on today's examination. No acute consolidative airspace disease. Mild paraseptal emphysema. Bilateral apical pleuroparenchymal thickening, similar to prior studies, compatible with chronic post infectious or inflammatory scarring.  Musculoskeletal: There are no aggressive appearing lytic or blastic lesions noted in the visualized portions of the skeleton.  CT ABDOMEN AND PELVIS FINDINGS  Abdomen/Pelvis: The appearance of the liver, gallbladder, pancreas, spleen and bilateral adrenal glands is unremarkable. There is extensive  bilateral perinephric stranding which is nonspecific. Mild parenchymal atrophy in both of the kidneys. Left-sided extra renal pelvis again noted. There appears to be some enhancement of the urothelium in the left extra renal pelvis, without a definite new enhancing mural nodule. No hydroureteronephrosis. 1.2 cm low-attenuation nonenhancing lesion in the interpolar region of the left kidney anteriorly is compatible with a small simple cyst.  Extensive atherosclerosis throughout the abdominal and pelvic vasculature, including a small ulcerated plaque in the distal abdominal aorta (image 86 of series 2) which is unchanged. No significant volume of ascites. No pneumoperitoneum. No pathologic distention of small bowel. No definite lymphadenopathy in the abdomen or pelvis. Urinary bladder is unremarkable in appearance. Prostate calcifications (nonspecific).  Musculoskeletal: There are no aggressive appearing lytic or blastic lesions noted in the visualized portions of the skeleton.  IMPRESSION: 1. No signs of residual or recurrent disease in the thorax, and no signs of metastatic disease to the abdomen or pelvis. 2. Extensive bilateral perinephric stranding is again noted, but is nonspecific. There does appear to be some avid enhancement of the left urothelium in the patient's extra renal pelvis. This is of uncertain etiology and significance. Correlation with urinalysis and urine cytology may be warranted. 3. Atherosclerosis, including  left main and 3 vessel coronary artery disease. Please note that although the presence of coronary artery calcium documents the presence of coronary artery disease, the severity of this disease and any potential stenosis cannot be assessed on this non-gated CT examination. Assessment for potential risk factor modification, dietary therapy or pharmacologic therapy may be warranted, if clinically indicated. 4. Small amount of pericardial fluid and or thickening, unchanged. This is unlikely  to be of hemodynamic significance at this time. 5. Additional incidental findings, as above.   Electronically Signed   By: Trudie Reed M.D.   On: 01/16/2013 11:29    ASSESSMENT AND PLAN: this is a very pleasant 67 years old white male with metastatic non-small cell lung cancer, adenocarcinoma currently undergoing systemic chemotherapy with Alimta and Avastin status post 3 cycles. The patient tolerated the last cycle of his treatment fairly well. His recent CT scan of the chest, abdomen and pelvis showed no evidence for disease progression. I discussed the scan results with the patient and his wife and personally reviewed the images. I recommended for the patient to continue his current treatment with Alimta and Avastin. He would come back for follow up visit in 3 weeks with the next cycle of his chemotherapy.  He was advised to call immediately if he has any concerning symptoms in the interval. The patient voices understanding of current disease status and treatment options and is in agreement with the current care plan.  All questions were answered. The patient knows to call the clinic with any problems, questions or concerns. We can certainly see the patient much sooner if necessary.

## 2013-01-23 NOTE — Patient Instructions (Signed)
Cancer Center Discharge Instructions for Patients Receiving Chemotherapy  Today you received the following chemotherapy agents: Alimta and Avastin. To help prevent nausea and vomiting after your treatment, we encourage you to take your nausea medication.   If you develop nausea and vomiting that is not controlled by your nausea medication, call the clinic.   BELOW ARE SYMPTOMS THAT SHOULD BE REPORTED IMMEDIATELY:  *FEVER GREATER THAN 100.5 F  *CHILLS WITH OR WITHOUT FEVER  NAUSEA AND VOMITING THAT IS NOT CONTROLLED WITH YOUR NAUSEA MEDICATION  *UNUSUAL SHORTNESS OF BREATH  *UNUSUAL BRUISING OR BLEEDING  TENDERNESS IN MOUTH AND THROAT WITH OR WITHOUT PRESENCE OF ULCERS  *URINARY PROBLEMS  *BOWEL PROBLEMS  UNUSUAL RASH Items with * indicate a potential emergency and should be followed up as soon as possible.  Feel free to call the clinic you have any questions or concerns. The clinic phone number is [phone].    

## 2013-01-23 NOTE — Telephone Encounter (Signed)
gv and printed appt sched and avs for pt....MW added tx   °

## 2013-01-23 NOTE — Patient Instructions (Signed)
Continue chemotherapy with Alimta and Avastin as scheduled.  Followup visit in 3 weeks.

## 2013-01-25 ENCOUNTER — Ambulatory Visit: Payer: Medicare Other

## 2013-02-01 ENCOUNTER — Other Ambulatory Visit: Payer: Self-pay

## 2013-02-13 ENCOUNTER — Ambulatory Visit (HOSPITAL_BASED_OUTPATIENT_CLINIC_OR_DEPARTMENT_OTHER): Payer: Medicare Other

## 2013-02-13 ENCOUNTER — Other Ambulatory Visit: Payer: Self-pay | Admitting: Physician Assistant

## 2013-02-13 ENCOUNTER — Telehealth: Payer: Self-pay | Admitting: Internal Medicine

## 2013-02-13 ENCOUNTER — Other Ambulatory Visit (HOSPITAL_BASED_OUTPATIENT_CLINIC_OR_DEPARTMENT_OTHER): Payer: Medicare Other

## 2013-02-13 ENCOUNTER — Ambulatory Visit (HOSPITAL_BASED_OUTPATIENT_CLINIC_OR_DEPARTMENT_OTHER): Payer: Medicare Other | Admitting: Physician Assistant

## 2013-02-13 ENCOUNTER — Encounter: Payer: Self-pay | Admitting: Physician Assistant

## 2013-02-13 DIAGNOSIS — C3410 Malignant neoplasm of upper lobe, unspecified bronchus or lung: Secondary | ICD-10-CM

## 2013-02-13 DIAGNOSIS — C7A090 Malignant carcinoid tumor of the bronchus and lung: Secondary | ICD-10-CM

## 2013-02-13 DIAGNOSIS — R5381 Other malaise: Secondary | ICD-10-CM

## 2013-02-13 DIAGNOSIS — Z5112 Encounter for antineoplastic immunotherapy: Secondary | ICD-10-CM

## 2013-02-13 DIAGNOSIS — Z23 Encounter for immunization: Secondary | ICD-10-CM

## 2013-02-13 DIAGNOSIS — Z5111 Encounter for antineoplastic chemotherapy: Secondary | ICD-10-CM

## 2013-02-13 LAB — CBC WITH DIFFERENTIAL/PLATELET
BASO%: 0.3 % (ref 0.0–2.0)
EOS%: 2.1 % (ref 0.0–7.0)
Eosinophils Absolute: 0.1 10*3/uL (ref 0.0–0.5)
HCT: 29.3 % — ABNORMAL LOW (ref 38.4–49.9)
LYMPH%: 19.1 % (ref 14.0–49.0)
MCH: 37.1 pg — ABNORMAL HIGH (ref 27.2–33.4)
MCHC: 33.8 g/dL (ref 32.0–36.0)
MCV: 109.7 fL — ABNORMAL HIGH (ref 79.3–98.0)
MONO#: 0.7 10*3/uL (ref 0.1–0.9)
MONO%: 11.3 % (ref 0.0–14.0)
NEUT#: 4.2 10*3/uL (ref 1.5–6.5)
NEUT%: 67.2 % (ref 39.0–75.0)
Platelets: 132 10*3/uL — ABNORMAL LOW (ref 140–400)
RBC: 2.67 10*6/uL — ABNORMAL LOW (ref 4.20–5.82)
RDW: 17.8 % — ABNORMAL HIGH (ref 11.0–14.6)
WBC: 6.3 10*3/uL (ref 4.0–10.3)
nRBC: 0 % (ref 0–0)

## 2013-02-13 LAB — COMPREHENSIVE METABOLIC PANEL (CC13)
ALT: 15 U/L (ref 0–55)
AST: 28 U/L (ref 5–34)
Anion Gap: 9 mEq/L (ref 3–11)
BUN: 20.7 mg/dL (ref 7.0–26.0)
CO2: 22 mEq/L (ref 22–29)
Calcium: 9.9 mg/dL (ref 8.4–10.4)
Chloride: 110 mEq/L — ABNORMAL HIGH (ref 98–109)
Creatinine: 1.6 mg/dL — ABNORMAL HIGH (ref 0.7–1.3)
Sodium: 140 mEq/L (ref 136–145)
Total Bilirubin: 0.5 mg/dL (ref 0.20–1.20)

## 2013-02-13 LAB — MAGNESIUM (CC13): Magnesium: 2.1 mg/dl (ref 1.5–2.5)

## 2013-02-13 LAB — UA PROTEIN, DIPSTICK - CHCC: Protein, ur: 100 mg/dL

## 2013-02-13 MED ORDER — PNEUMOCOCCAL VAC POLYVALENT 25 MCG/0.5ML IJ INJ
0.5000 mL | INJECTION | INTRAMUSCULAR | Status: AC
  Administered 2013-02-13: 0.5 mL via INTRAMUSCULAR
  Filled 2013-02-13: qty 0.5

## 2013-02-13 MED ORDER — DEXAMETHASONE SODIUM PHOSPHATE 10 MG/ML IJ SOLN
10.0000 mg | Freq: Once | INTRAMUSCULAR | Status: AC
  Administered 2013-02-13: 10 mg via INTRAVENOUS

## 2013-02-13 MED ORDER — SODIUM CHLORIDE 0.9 % IV SOLN
Freq: Once | INTRAVENOUS | Status: AC
  Administered 2013-02-13: 11:00:00 via INTRAVENOUS

## 2013-02-13 MED ORDER — SODIUM CHLORIDE 0.9 % IV SOLN
15.0000 mg/kg | Freq: Once | INTRAVENOUS | Status: AC
  Administered 2013-02-13: 975 mg via INTRAVENOUS
  Filled 2013-02-13: qty 39

## 2013-02-13 MED ORDER — SODIUM CHLORIDE 0.9 % IV SOLN
500.0000 mg/m2 | Freq: Once | INTRAVENOUS | Status: AC
  Administered 2013-02-13: 850 mg via INTRAVENOUS
  Filled 2013-02-13: qty 34

## 2013-02-13 MED ORDER — ONDANSETRON 8 MG/NS 50 ML IVPB
INTRAVENOUS | Status: AC
  Filled 2013-02-13: qty 8

## 2013-02-13 MED ORDER — INFLUENZA VAC SPLIT QUAD 0.5 ML IM SUSP
0.5000 mL | Freq: Once | INTRAMUSCULAR | Status: AC
  Administered 2013-02-13: 0.5 mL via INTRAMUSCULAR
  Filled 2013-02-13: qty 0.5

## 2013-02-13 MED ORDER — HEPARIN SOD (PORK) LOCK FLUSH 100 UNIT/ML IV SOLN
500.0000 [IU] | Freq: Once | INTRAVENOUS | Status: AC | PRN
  Administered 2013-02-13: 500 [IU]
  Filled 2013-02-13: qty 5

## 2013-02-13 MED ORDER — ONDANSETRON 8 MG/50ML IVPB (CHCC)
8.0000 mg | Freq: Once | INTRAVENOUS | Status: AC
  Administered 2013-02-13: 8 mg via INTRAVENOUS

## 2013-02-13 MED ORDER — SODIUM CHLORIDE 0.9 % IJ SOLN
10.0000 mL | INTRAMUSCULAR | Status: DC | PRN
  Administered 2013-02-13: 10 mL
  Filled 2013-02-13: qty 10

## 2013-02-13 MED ORDER — DEXAMETHASONE SODIUM PHOSPHATE 10 MG/ML IJ SOLN
INTRAMUSCULAR | Status: AC
  Filled 2013-02-13: qty 1

## 2013-02-13 MED ORDER — OMEPRAZOLE 40 MG PO CPDR: 40.0000 mg | DELAYED_RELEASE_CAPSULE | Freq: Every evening | ORAL | Status: DC

## 2013-02-13 NOTE — Progress Notes (Addendum)
[facility] [phone]   [phone]  SHARED VISIT PROGRESS NOTE  [name], MD [address] [place] [address]  DIAGNOSIS AND DIAGNOSIS: Metastatic non-small cell lung cancer adenocarcinoma diagnosed in September 2010   PRIOR THERAPY:  #1 status post 6 cycles of systemic chemotherapy with carboplatin, Alimta and Avastin given every 3 weeks last dose given 04/23/2009 with disease stabilization.  #2 status post palliative radiotherapy to the mediastinum under the care of Dr. Michell Heinrich. The patient received a total dose of 3000 cGY and 12 fractions completed 01/21/2009.  #3 Maintenance chemotherapy with Alimta at 500 mg per meter squared and Avastin at 15 mg per kilogram given every 3 weeks status post 35 cycles.  #4 Maintenance chemotherapy with Alimta 500 mg/M2 and Avastin 15 mg/kg every 4 weeks,status post 17 cycles, discontinued secondary to disease progression.  #5 Systemic chemotherapy with carboplatin for AUC of 5, Alimta 500 mg/M2 and Avastin 15 mg/kg every 3 weeks, status post 3 cycles, last cycle was given 12/28/2012. Carboplatin was discontinued starting cycle #2 secondary to hypersensitivity reaction.   CURRENT THERAPY: Maintenance chemotherapy again with Alimta 500 mg/M2 and Avastin 15 mg/kg every 3 weeks, first cycle 01/23/2013. Status post 1 cycle  CHEMOTHERAPY INTENT: palliative/maintenance.  CURRENT # OF CHEMOTHERAPY CYCLES: 2 CURRENT ANTIEMETICS: Compazine when necessary but clearly used.  CURRENT SMOKING STATUS: current nonsmoker  ORAL CHEMOTHERAPY AND CONSENT: None.  CURRENT BISPHOSPHONATES USE: None.  PAIN MANAGEMENT: No Pain  NARCOTICS INDUCED CONSTIPATION: N/A  LIVING WILL AND CODE STATUS: NCB  INTERVAL HISTORY:  [name] 67 y.o. male returns to the clinic today for follow up visit. The patient is feeling fine today with no specific complaints except for mild fatigue. He also has mild cough and  shortness of breath that he states his baseline. He tolerated the last cycle of his treatment with Alimta and Avastin fairly well with no significant adverse effects. The patient denied having any fever or chills. He has no nausea or vomiting. He denied having any significant chest pain, worsening shortness breath, productive cough or hemoptysis. He was restarted on maintenance chemotherapy with Alimta and Avastin and is status post 1 cycle. He presents today to proceed to cycle #2 of his maintenance chemotherapy with Alimta 500 mg per meter squared and Avastin at 15 mg per meter squared given every 3 weeks. He requests a refill for his omeprazole. He would like a flu vaccine and Pneumovax vaccine today.   MEDICAL HISTORY: Past Medical History  Diagnosis Date  . Hypertension   . Arthritis RIGHT SHOULDER  . Immature cataract BILATERAL  . Non-small cell lung cancer DX SEPT 2010  W/ CHEMORADIATION AT THAT TIME -- NOW  W/ METS--  CURRENTLY ON MAINTENANCE CHEMO TX  EVERY 30 DAYS    ONCOLOGIST- [facility]  . Acute meniscal tear of knee RIGHT KNEE  . BPH (benign prostatic hypertrophy)     ALLERGIES:  is allergic to carboplatin; percocet; and zolpidem tartrate.  MEDICATIONS:  Current Outpatient Prescriptions  Medication Sig Dispense Refill  . amLODipine (NORVASC) 2.5 MG tablet Take 2.5 mg by mouth every morning.       . Ascorbic Acid (VITAMIN C) 1000 MG tablet Take 1,000 mg by mouth daily.      Marland Kitchen atenolol (TENORMIN) 100 MG tablet Take 100 mg by mouth every morning.       . B Complex-Biotin-FA (B-COMPLEX PO) Take 1 tablet by mouth daily. 1500mg  daily      .  doxazosin (CARDURA) 2 MG tablet Take 2 mg by mouth at bedtime.       . FeFum-FePoly-FA-B Cmp-C-Biot (FOLIVANE-PLUS) CAPS Take 1 capsule by mouth daily.  90 capsule  3  . fexofenadine (ALLEGRA) 180 MG tablet Take 180 mg by mouth daily as needed. Allergies      . folic acid (FOLVITE) 1 MG tablet Take 1 tablet (1 mg total) by mouth daily.  90 tablet   1  . HYDROcodone-homatropine (HYCODAN) 5-1.5 MG/5ML syrup Take 5 mLs by mouth every 6 (six) hours as needed for cough.      . Multiple Vitamin (MULTIVITAMIN) tablet Take 1 tablet by mouth daily.       Marland Kitchen omeprazole (PRILOSEC) 40 MG capsule Take 1 capsule (40 mg total) by mouth every evening.  90 capsule  3  . prochlorperazine (COMPAZINE) 10 MG tablet Take 1 tablet (10 mg total) by mouth every 6 (six) hours as needed.  30 tablet  0  . Sennosides-Docusate Sodium (SENOKOT S PO) Take 2 tablets by mouth daily. 2 tabs daily      . nystatin (MYCOSTATIN) 100000 UNIT/ML suspension        Current Facility-Administered Medications  Medication Dose Route Frequency Provider Last Rate Last Dose  . influenza vac split quadrivalent PF (FLUARIX) injection 0.5 mL  0.5 mL Intramuscular Once Nathan Seger [name] Wille Aubuchon, PA-C      . [START ON 02/14/2013] pneumococcal 23 valent vaccine (PNU-IMMUNE) injection 0.5 mL  0.5 mL Intramuscular Tomorrow-1000 Nathan Slipper, PA-C        SURGICAL HISTORY:  Past Surgical History  Procedure Laterality Date  . 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 Rotator cuff repair  2008    LEFT SHOULDER  . Bilateral ear drum surgery  1960'S  . Orif left ring finger  and revascularization of radial side  02-16-2007    DEGLOVING INJURY  . Knee arthroscopy  12/21/2011    Procedure: ARTHROSCOPY KNEE;  Surgeon: Jacki Cones, MD;  Location: [facility];  Service: Orthopedics;  Laterality: Right;  WITH MEDIAL MENISECTOMY    REVIEW OF SYSTEMS:  Constitutional: positive for fatigue Eyes: negative Ears, nose, mouth, throat, and face: negative Respiratory: positive for cough and dyspnea on exertion Cardiovascular: negative Gastrointestinal: negative Genitourinary:negative Integument/breast: negative Hematologic/lymphatic: negative Musculoskeletal:negative Neurological: negative Behavioral/Psych:  negative Endocrine: negative Allergic/Immunologic: negative   PHYSICAL EXAMINATION: General appearance: alert, cooperative, fatigued and no distress Head: Normocephalic, without obvious abnormality, atraumatic Neck: no adenopathy, no JVD, supple, symmetrical, trachea midline and thyroid not enlarged, symmetric, no tenderness/mass/nodules Lymph nodes: Cervical, supraclavicular, and axillary nodes normal. Resp: clear to auscultation bilaterally Back: symmetric, no curvature. ROM normal. No CVA tenderness. Cardio: regular rate and rhythm, S1, S2 normal, no murmur, click, rub or gallop GI: soft, non-tender; bowel sounds normal; no masses,  no organomegaly Extremities: extremities normal, atraumatic, no cyanosis or edema Neurologic: Alert and oriented X 3, normal strength and tone. Normal symmetric reflexes. Normal coordination and gait  ECOG PERFORMANCE STATUS: 1 - Symptomatic but completely ambulatory  Blood pressure 167/85, pulse 63, temperature 97.2 F (36.2 C), temperature source Oral, resp. rate 18, height 5\' 7"  (1.702 m), weight 138 lb 4.8 oz (62.732 kg).  LABORATORY DATA: Lab Results  Component Value Date   WBC 6.3 02/13/2013   HGB 9.9* 02/13/2013   HCT 29.3* 02/13/2013   MCV 109.7* 02/13/2013   PLT 132* 02/13/2013  Chemistry      Component Value Date/Time   NA 142 01/23/2013 0938   NA 140 12/21/2011 1252   K 4.4 01/23/2013 0938   K 3.3* 12/21/2011 1252   CL 109* 08/29/2012 0856   CL 101 11/18/2011 0858   CO2 23 01/23/2013 0938   CO2 22 11/18/2011 0858   BUN 17.9 01/23/2013 0938   BUN 19 11/18/2011 0858   CREATININE 1.5* 01/23/2013 0938   CREATININE 1.34 11/18/2011 0858      Component Value Date/Time   CALCIUM 9.6 01/23/2013 0938   CALCIUM 9.5 11/18/2011 0858   ALKPHOS 70 01/23/2013 0938   ALKPHOS 55 11/18/2011 0858   AST 28 01/23/2013 0938   AST 25 11/18/2011 0858   ALT 17 01/23/2013 0938   ALT 17 11/18/2011 0858   BILITOT 0.53 01/23/2013 0938   BILITOT 0.5  11/18/2011 0858       RADIOGRAPHIC STUDIES: Ct Chest W Contrast  01/16/2013   CLINICAL DATA:  History of lung cancer status post chemotherapy and radiation therapy. Currently on chemotherapy.  EXAM: CT CHEST, ABDOMEN, AND PELVIS WITH CONTRAST  TECHNIQUE: Multidetector CT imaging of the chest, abdomen and pelvis was performed following the standard protocol during bolus administration of intravenous contrast.  CONTRAST:  OMNIPAQUE IOHEXOL 300 MG/ML  SOLN  COMPARISON:  CT of the chest, abdomen and pelvis 10/26/2012.  FINDINGS: CT CHEST FINDINGS  Mediastinum: Heart size is normal. Small amount of anterior pericardial fluid and or thickening, unchanged compared to the prior study, and unlikely to be of any hemodynamic significance at this time. No associated pericardial calcification. There is atherosclerosis of the thoracic aorta, the great vessels of the mediastinum and the coronary arteries, including calcified atherosclerotic plaque in the left main, left anterior descending, left circumflex and right coronary arteries. No pathologically enlarged mediastinal or hilar lymph nodes. There are numerous nonenlarged mediastinal lymph nodes which are similar to the prior examination, and are nonspecific. Esophagus is unremarkable in appearance. Right internal jugular single-lumen porta cath with tip terminating at the superior cavoatrial junction.  Lungs/Pleura: Chronic volume loss and architectural distortion is again noted in the paramediastinal aspect of the right upper lobe where there is also a dystrophic calcification, related to prior mediastinal radiation therapy. No definite suspicious appearing pulmonary nodule or mass is identified on today's examination. No acute consolidative airspace disease. Mild paraseptal emphysema. Bilateral apical pleuroparenchymal thickening, similar to prior studies, compatible with chronic post infectious or inflammatory scarring.  Musculoskeletal: There are no aggressive  appearing lytic or blastic lesions noted in the visualized portions of the skeleton.  CT ABDOMEN AND PELVIS FINDINGS  Abdomen/Pelvis: The appearance of the liver, gallbladder, pancreas, spleen and bilateral adrenal glands is unremarkable. There is extensive bilateral perinephric stranding which is nonspecific. Mild parenchymal atrophy in both of the kidneys. Left-sided extra renal pelvis again noted. There appears to be some enhancement of the urothelium in the left extra renal pelvis, without a definite new enhancing mural nodule. No hydroureteronephrosis. 1.2 cm low-attenuation nonenhancing lesion in the interpolar region of the left kidney anteriorly is compatible with a small simple cyst.  Extensive atherosclerosis throughout the abdominal and pelvic vasculature, including a small ulcerated plaque in the distal abdominal aorta (image 86 of series 2) which is unchanged. No significant volume of ascites. No pneumoperitoneum. No pathologic distention of small bowel. No definite lymphadenopathy in the abdomen or pelvis. Urinary bladder is unremarkable in appearance. Prostate calcifications (nonspecific).  Musculoskeletal: There are no aggressive appearing lytic or  blastic lesions noted in the visualized portions of the skeleton.  IMPRESSION: 1. No signs of residual or recurrent disease in the thorax, and no signs of metastatic disease to the abdomen or pelvis. 2. Extensive bilateral perinephric stranding is again noted, but is nonspecific. There does appear to be some avid enhancement of the left urothelium in the patient's extra renal pelvis. This is of uncertain etiology and significance. Correlation with urinalysis and urine cytology may be warranted. 3. Atherosclerosis, including left main and 3 vessel coronary artery disease. Please note that although the presence of coronary artery calcium documents the presence of coronary artery disease, the severity of this disease and any potential stenosis cannot be  assessed on this non-gated CT examination. Assessment for potential risk factor modification, dietary therapy or pharmacologic therapy may be warranted, if clinically indicated. 4. Small amount of pericardial fluid and or thickening, unchanged. This is unlikely to be of hemodynamic significance at this time. 5. Additional incidental findings, as above.   Electronically Signed   By: Trudie Reed M.D.   On: 01/16/2013 11:29   Ct Abdomen Pelvis W Contrast  01/16/2013   CLINICAL DATA:  History of lung cancer status post chemotherapy and radiation therapy. Currently on chemotherapy.  EXAM: CT CHEST, ABDOMEN, AND PELVIS WITH CONTRAST  TECHNIQUE: Multidetector CT imaging of the chest, abdomen and pelvis was performed following the standard protocol during bolus administration of intravenous contrast.  CONTRAST:  OMNIPAQUE IOHEXOL 300 MG/ML  SOLN  COMPARISON:  CT of the chest, abdomen and pelvis 10/26/2012.  FINDINGS: CT CHEST FINDINGS  Mediastinum: Heart size is normal. Small amount of anterior pericardial fluid and or thickening, unchanged compared to the prior study, and unlikely to be of any hemodynamic significance at this time. No associated pericardial calcification. There is atherosclerosis of the thoracic aorta, the great vessels of the mediastinum and the coronary arteries, including calcified atherosclerotic plaque in the left main, left anterior descending, left circumflex and right coronary arteries. No pathologically enlarged mediastinal or hilar lymph nodes. There are numerous nonenlarged mediastinal lymph nodes which are similar to the prior examination, and are nonspecific. Esophagus is unremarkable in appearance. Right internal jugular single-lumen porta cath with tip terminating at the superior cavoatrial junction.  Lungs/Pleura: Chronic volume loss and architectural distortion is again noted in the paramediastinal aspect of the right upper lobe where there is also a dystrophic  calcification, related to prior mediastinal radiation therapy. No definite suspicious appearing pulmonary nodule or mass is identified on today's examination. No acute consolidative airspace disease. Mild paraseptal emphysema. Bilateral apical pleuroparenchymal thickening, similar to prior studies, compatible with chronic post infectious or inflammatory scarring.  Musculoskeletal: There are no aggressive appearing lytic or blastic lesions noted in the visualized portions of the skeleton.  CT ABDOMEN AND PELVIS FINDINGS  Abdomen/Pelvis: The appearance of the liver, gallbladder, pancreas, spleen and bilateral adrenal glands is unremarkable. There is extensive bilateral perinephric stranding which is nonspecific. Mild parenchymal atrophy in both of the kidneys. Left-sided extra renal pelvis again noted. There appears to be some enhancement of the urothelium in the left extra renal pelvis, without a definite new enhancing mural nodule. No hydroureteronephrosis. 1.2 cm low-attenuation nonenhancing lesion in the interpolar region of the left kidney anteriorly is compatible with a small simple cyst.  Extensive atherosclerosis throughout the abdominal and pelvic vasculature, including a small ulcerated plaque in the distal abdominal aorta (image 86 of series 2) which is unchanged. No significant volume of ascites. No pneumoperitoneum. No pathologic  distention of small bowel. No definite lymphadenopathy in the abdomen or pelvis. Urinary bladder is unremarkable in appearance. Prostate calcifications (nonspecific).  Musculoskeletal: There are no aggressive appearing lytic or blastic lesions noted in the visualized portions of the skeleton.  IMPRESSION: 1. No signs of residual or recurrent disease in the thorax, and no signs of metastatic disease to the abdomen or pelvis. 2. Extensive bilateral perinephric stranding is again noted, but is nonspecific. There does appear to be some avid enhancement of the left urothelium in the  patient's extra renal pelvis. This is of uncertain etiology and significance. Correlation with urinalysis and urine cytology may be warranted. 3. Atherosclerosis, including left main and 3 vessel coronary artery disease. Please note that although the presence of coronary artery calcium documents the presence of coronary artery disease, the severity of this disease and any potential stenosis cannot be assessed on this non-gated CT examination. Assessment for potential risk factor modification, dietary therapy or pharmacologic therapy may be warranted, if clinically indicated. 4. Small amount of pericardial fluid and or thickening, unchanged. This is unlikely to be of hemodynamic significance at this time. 5. Additional incidental findings, as above.   Electronically Signed   By: Trudie Reed M.D.   On: 01/16/2013 11:29    ASSESSMENT AND PLAN: this is a very pleasant 67 years old white male with metastatic non-small cell lung cancer, adenocarcinoma currently undergoing systemic chemotherapy with Alimta and Avastin status post 3 cycles. Carboplatin was removed after cycle 2 due to hypersensitivity reaction. The patient tolerated the last cycle of his treatment fairly well. His recent CT scan of the chest, abdomen and pelvis showed no evidence for disease progression. He is now receiving maintenance chemotherapy in the form of Alimta at 500 mg per meter squared and Avastin at 15 mg per meter squared given every 3 weeks status post 1 cycle. Patient was discussed with also seen by Dr. Arbutus Ped. He'll proceed with cycle #2 of his maintenance chemotherapy with Alimta and Avastin. He'll receive both the flu and Pneumovax vaccines today as requested. His omeprazole 40 mg capsule one by mouth daily total of 90 with 3 refills was sent to his mail-order pharmacy of record. As he is been on this medication for quite sometime we will check a magnesium level to ensure that this is not decreasing. He'll followup in 3 weeks  prior to cycle #3 of his resumed maintenance chemotherapy with Alimta and Avastin.   [name], [name], [name]   He was advised to call immediately if he has any concerning symptoms in the interval. The patient voices understanding of current disease status and treatment options and is in agreement with the current care plan.  All questions were answered. The patient knows to call the clinic with any problems, questions or concerns. We can certainly see the patient much sooner if necessary.  ADDENDUM: Hematology/Oncology Attending: I had a face to face encounter with the patient today. I recommended his care plan. This is a very pleasant 67 years old white male with history of metastatic non-small cell lung cancer currently undergoing maintenance therapy with Alimta and Avastin. The patient is tolerating his treatment fairly well with no significant adverse effects. He denied having any bleeding issues. He has no nausea or vomiting, no fever or chills. We will proceed with the treatment today as scheduled. The patient would come back for followup visit in 3 weeks with the next cycle of his chemotherapy. The patient will be getting flu vaccine as well  as Pneumovax today. He was advised to call immediately if he has any concerning symptoms in the interval. Lajuana Matte., MD 02/13/2013

## 2013-02-13 NOTE — Patient Instructions (Signed)
[facility] Discharge Instructions for Patients Receiving Chemotherapy  Today you received the following chemotherapy agents: Alimta and Avastin. To help prevent nausea and vomiting after your treatment, we encourage you to take your nausea medication.   If you develop nausea and vomiting that is not controlled by your nausea medication, call the clinic.   BELOW ARE SYMPTOMS THAT SHOULD BE REPORTED IMMEDIATELY:  *FEVER GREATER THAN 100.5 F  *CHILLS WITH OR WITHOUT FEVER  NAUSEA AND VOMITING THAT IS NOT CONTROLLED WITH YOUR NAUSEA MEDICATION  *UNUSUAL SHORTNESS OF BREATH  *UNUSUAL BRUISING OR BLEEDING  TENDERNESS IN MOUTH AND THROAT WITH OR WITHOUT PRESENCE OF ULCERS  *URINARY PROBLEMS  *BOWEL PROBLEMS  UNUSUAL RASH Items with * indicate a potential emergency and should be followed up as soon as possible.  Feel free to call the clinic you have any questions or concerns. The clinic phone number is [phone].    

## 2013-02-13 NOTE — Telephone Encounter (Signed)
gv and printed appt sched and avs for pt for pt for Dec...sed added tx.

## 2013-02-13 NOTE — Patient Instructions (Signed)
Follow-up in 3 weeks

## 2013-02-20 ENCOUNTER — Ambulatory Visit: Payer: Medicare Other

## 2013-02-20 ENCOUNTER — Other Ambulatory Visit: Payer: Medicare Other | Admitting: Lab

## 2013-03-06 ENCOUNTER — Telehealth: Payer: Self-pay | Admitting: *Deleted

## 2013-03-06 ENCOUNTER — Telehealth: Payer: Self-pay | Admitting: Internal Medicine

## 2013-03-06 ENCOUNTER — Ambulatory Visit: Payer: Medicare Other

## 2013-03-06 ENCOUNTER — Ambulatory Visit (HOSPITAL_BASED_OUTPATIENT_CLINIC_OR_DEPARTMENT_OTHER): Payer: Medicare Other | Admitting: Internal Medicine

## 2013-03-06 ENCOUNTER — Encounter: Payer: Self-pay | Admitting: Internal Medicine

## 2013-03-06 ENCOUNTER — Other Ambulatory Visit (HOSPITAL_BASED_OUTPATIENT_CLINIC_OR_DEPARTMENT_OTHER): Payer: Medicare Other

## 2013-03-06 ENCOUNTER — Other Ambulatory Visit: Payer: Self-pay | Admitting: Medical Oncology

## 2013-03-06 DIAGNOSIS — C3491 Malignant neoplasm of unspecified part of right bronchus or lung: Secondary | ICD-10-CM

## 2013-03-06 DIAGNOSIS — I10 Essential (primary) hypertension: Secondary | ICD-10-CM

## 2013-03-06 DIAGNOSIS — C3410 Malignant neoplasm of upper lobe, unspecified bronchus or lung: Secondary | ICD-10-CM

## 2013-03-06 LAB — CBC WITH DIFFERENTIAL/PLATELET
BASO%: 0.5 % (ref 0.0–2.0)
EOS%: 2.5 % (ref 0.0–7.0)
HGB: 8.2 g/dL — ABNORMAL LOW (ref 13.0–17.1)
MCH: 39.2 pg — ABNORMAL HIGH (ref 27.2–33.4)
MCHC: 34 g/dL (ref 32.0–36.0)
MONO#: 0.8 10*3/uL (ref 0.1–0.9)
RBC: 2.1 10*6/uL — ABNORMAL LOW (ref 4.20–5.82)
RDW: 17.8 % — ABNORMAL HIGH (ref 11.0–14.6)
WBC: 6.5 10*3/uL (ref 4.0–10.3)
lymph#: 1 10*3/uL (ref 0.9–3.3)

## 2013-03-06 LAB — COMPREHENSIVE METABOLIC PANEL (CC13)
ALT: 18 U/L (ref 0–55)
AST: 27 U/L (ref 5–34)
Albumin: 3 g/dL — ABNORMAL LOW (ref 3.5–5.0)
Alkaline Phosphatase: 85 U/L (ref 40–150)
CO2: 18 mEq/L — ABNORMAL LOW (ref 22–29)
Chloride: 110 mEq/L — ABNORMAL HIGH (ref 98–109)
Creatinine: 1.6 mg/dL — ABNORMAL HIGH (ref 0.7–1.3)
Glucose: 122 mg/dl (ref 70–140)
Potassium: 4.4 mEq/L (ref 3.5–5.1)
Sodium: 138 mEq/L (ref 136–145)
Total Protein: 6.3 g/dL — ABNORMAL LOW (ref 6.4–8.3)

## 2013-03-06 MED ORDER — AZITHROMYCIN 250 MG PO TABS: ORAL_TABLET | ORAL | Status: DC

## 2013-03-06 MED ORDER — HYDROCODONE-HOMATROPINE 5-1.5 MG/5ML PO SYRP: 5.0000 mL | ORAL_SOLUTION | Freq: Four times a day (QID) | ORAL | Status: DC | PRN

## 2013-03-06 NOTE — Telephone Encounter (Signed)
worked 12/9 POF adv MW no chemo today and rs for Jan 6 Cannot make lab appt due to lab schedule being worked on today will add this lab on tomorrow shh

## 2013-03-06 NOTE — Telephone Encounter (Signed)
Per staff message and POF I have scheduled appts.  JMW  

## 2013-03-06 NOTE — Telephone Encounter (Signed)
Dr Arbutus Ped escribed zpak to [facility] so I cancelled it and called it to [facility] [place]. Pt notified.

## 2013-03-06 NOTE — Patient Instructions (Signed)
We will delay chemotherapy until next week. Followup visit in 4 weeks with repeat CT scan of the chest, abdomen and pelvis.

## 2013-03-06 NOTE — Progress Notes (Signed)
[facility] [phone]   Fax:([phone]  OFFICE PROGRESS NOTE  [name], MD [address] [place] [address]  DIAGNOSIS AND DIAGNOSIS: Metastatic non-small cell lung cancer adenocarcinoma diagnosed in September 2010   PRIOR THERAPY:  #1 status post 6 cycles of systemic chemotherapy with carboplatin, Alimta and Avastin given every 3 weeks last dose given 04/23/2009 with disease stabilization.  #2 status post palliative radiotherapy to the mediastinum under the care of Dr. Michell Heinrich. The patient received a total dose of 3000 cGY and 12 fractions completed 01/21/2009.  #3 Maintenance chemotherapy with Alimta at 500 mg per meter squared and Avastin at 15 mg per kilogram given every 3 weeks status post 35 cycles.  #4 Maintenance chemotherapy with Alimta 500 mg/M2 and Avastin 15 mg/kg every 4 weeks,status post 17 cycles, discontinued secondary to disease progression.  #5 Systemic chemotherapy with carboplatin for AUC of 5, Alimta 500 mg/M2 and Avastin 15 mg/kg every 3 weeks, status post 3 cycles, last cycle was given 12/28/2012. Carboplatin was discontinued starting cycle #2 secondary to hypersensitivity reaction.   CURRENT THERAPY: Maintenance chemotherapy again with Alimta 500 mg/M2 and Avastin 15 mg/kg every 3 weeks, first cycle 01/23/2013. Status post 2 cycles.  CHEMOTHERAPY INTENT: palliative/maintenance.  CURRENT # OF CHEMOTHERAPY CYCLES: 2 CURRENT ANTIEMETICS: Compazine when necessary but clearly used.  CURRENT SMOKING STATUS: current nonsmoker  ORAL CHEMOTHERAPY AND CONSENT: None.  CURRENT BISPHOSPHONATES USE: None.  PAIN MANAGEMENT: No Pain  NARCOTICS INDUCED CONSTIPATION: N/A  LIVING WILL AND CODE STATUS: NCB  INTERVAL HISTORY:  [name] 67 y.o. male returns to the clinic today for follow up visit accompanied by his wife. The patient is complaining today of increasing fatigue and weakness as well as sore throat and  chest congestion. He also has mild headache. He was seen by his primary care physician as well as ENT and was treated with nasal spray. He denied having any significant fever or chills. He is to have mild cough and requested a refill of Hycodan. He tolerated the last cycle of his treatment with Alimta and Avastin fairly well with no significant adverse effects. He has no nausea or vomiting. He denied having any significant chest pain, shortness of breath, or hemoptysis.   MEDICAL HISTORY: Past Medical History  Diagnosis Date  . Hypertension   . Arthritis RIGHT SHOULDER  . Immature cataract BILATERAL  . Non-small cell lung cancer DX SEPT 2010  W/ CHEMORADIATION AT THAT TIME -- NOW  W/ METS--  CURRENTLY ON MAINTENANCE CHEMO TX  EVERY 30 DAYS    ONCOLOGIST- DR [facility]  . Acute meniscal tear of knee RIGHT KNEE  . BPH (benign prostatic hypertrophy)     ALLERGIES:  is allergic to carboplatin; percocet; and zolpidem tartrate.  MEDICATIONS:  Current Outpatient Prescriptions  Medication Sig Dispense Refill  . amLODipine (NORVASC) 2.5 MG tablet Take 2.5 mg by mouth every morning.       . Ascorbic Acid (VITAMIN C) 1000 MG tablet Take 1,000 mg by mouth daily.      Marland Kitchen atenolol (TENORMIN) 100 MG tablet Take 100 mg by mouth every morning.       . B Complex-Biotin-FA (B-COMPLEX PO) Take 1 tablet by mouth daily. 1500mg  daily      . doxazosin (CARDURA) 2 MG tablet Take 2 mg by mouth at bedtime.       . FeFum-FePoly-FA-B Cmp-C-Biot (FOLIVANE-PLUS) CAPS Take 1 capsule by mouth daily.  90 capsule  3  . fexofenadine (ALLEGRA) 180 MG tablet Take 180 mg by mouth daily as needed. Allergies      . folic acid (FOLVITE) 1 MG tablet Take 1 tablet (1 mg total) by mouth daily.  90 tablet  1  . HYDROcodone-homatropine (HYCODAN) 5-1.5 MG/5ML syrup Take 5 mLs by mouth every 6 (six) hours as needed for cough.      . Multiple Vitamin (MULTIVITAMIN) tablet Take 1 tablet by mouth daily.       Marland Kitchen omeprazole (PRILOSEC) 40 MG  capsule Take 1 capsule (40 mg total) by mouth every evening.  90 capsule  3  . Sennosides-Docusate Sodium (SENOKOT S PO) Take 2 tablets by mouth daily. 2 tabs daily      . prochlorperazine (COMPAZINE) 10 MG tablet Take 1 tablet (10 mg total) by mouth every 6 (six) hours as needed.  30 tablet  0   No current facility-administered medications for this visit.    SURGICAL HISTORY:  Past Surgical History  Procedure Laterality Date  . 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 Rotator cuff repair  2008    LEFT SHOULDER  . Bilateral ear drum surgery  1960'S  . Orif left ring finger  and revascularization of radial side  02-16-2007    DEGLOVING INJURY  . Knee arthroscopy  12/21/2011    Procedure: ARTHROSCOPY KNEE;  Surgeon: Jacki Cones, MD;  Location: [facility];  Service: Orthopedics;  Laterality: Right;  WITH MEDIAL MENISECTOMY    REVIEW OF SYSTEMS:  Constitutional: positive for fatigue Eyes: negative Ears, nose, mouth, throat, and face: negative Respiratory: negative Cardiovascular: negative Gastrointestinal: negative Genitourinary:negative Integument/breast: negative Hematologic/lymphatic: negative Musculoskeletal:negative Neurological: negative Behavioral/Psych: negative Endocrine: negative Allergic/Immunologic: negative   PHYSICAL EXAMINATION: General appearance: alert, cooperative, fatigued and no distress Head: Normocephalic, without obvious abnormality, atraumatic Neck: no adenopathy, no JVD, supple, symmetrical, trachea midline and thyroid not enlarged, symmetric, no tenderness/mass/nodules Lymph nodes: Cervical, supraclavicular, and axillary nodes normal. Resp: clear to auscultation bilaterally Back: symmetric, no curvature. ROM normal. No CVA tenderness. Cardio: regular rate and rhythm, S1, S2 normal, no murmur, click, rub or gallop GI: soft, non-tender; bowel sounds normal; no masses,   no organomegaly Extremities: extremities normal, atraumatic, no cyanosis or edema Neurologic: Alert and oriented X 3, normal strength and tone. Normal symmetric reflexes. Normal coordination and gait  ECOG PERFORMANCE STATUS: 1 - Symptomatic but completely ambulatory  Blood pressure 152/63, pulse 62, temperature 97.4 F (36.3 C), temperature source Oral, resp. rate 18, height 5\' 7"  (1.702 m), weight 136 lb 12.8 oz (62.052 kg), SpO2 100.00%.  LABORATORY DATA: Lab Results  Component Value Date   WBC 6.5 03/06/2013   HGB 8.2* 03/06/2013   HCT 24.2* 03/06/2013   MCV 115.3* 03/06/2013   PLT 178 03/06/2013      Chemistry      Component Value Date/Time   NA 138 03/06/2013 0929   NA 140 12/21/2011 1252   K 4.4 03/06/2013 0929   K 3.3* 12/21/2011 1252   CL 109* 08/29/2012 0856   CL 101 11/18/2011 0858   CO2 18* 03/06/2013 0929   CO2 22 11/18/2011 0858   BUN 19.2 03/06/2013 0929   BUN 19 11/18/2011 0858   CREATININE 1.6* 03/06/2013 0929   CREATININE 1.34 11/18/2011 0858      Component Value Date/Time   CALCIUM 9.5 03/06/2013 0929   CALCIUM 9.5 11/18/2011 0858   ALKPHOS  85 03/06/2013 0929   ALKPHOS 55 11/18/2011 0858   AST 27 03/06/2013 0929   AST 25 11/18/2011 0858   ALT 18 03/06/2013 0929   ALT 17 11/18/2011 0858   BILITOT 0.24 03/06/2013 0929   BILITOT 0.5 11/18/2011 0858       RADIOGRAPHIC STUDIES:   ASSESSMENT AND PLAN: This is a very pleasant 67 years old white male with metastatic non-small cell lung cancer, adenocarcinoma currently undergoing systemic chemotherapy with Alimta and Avastin status post 2 cycles. The patient tolerated the last cycle of his treatment fairly well. He is not feeling well today with chest congestion and sore throat. I will delay his treatment till next week. For this throat and chest congestion, I ordered Z-Pak. He would come back for followup visit in 4 weeks with repeat CT scan of the chest, abdomen and pelvis for restaging of his disease He was given a refill  for Hycodan for cough He was advised to call immediately if he has any concerning symptoms in the interval. The patient voices understanding of current disease status and treatment options and is in agreement with the current care plan.  All questions were answered. The patient knows to call the clinic with any problems, questions or concerns. We can certainly see the patient much sooner if necessary.  I spent 15 minutes counseling the patient face to face. The total time spent in the appointment was 25 minutes.

## 2013-03-12 ENCOUNTER — Other Ambulatory Visit: Payer: Self-pay | Admitting: *Deleted

## 2013-03-13 ENCOUNTER — Ambulatory Visit (HOSPITAL_BASED_OUTPATIENT_CLINIC_OR_DEPARTMENT_OTHER): Payer: Medicare Other

## 2013-03-13 ENCOUNTER — Other Ambulatory Visit (HOSPITAL_BASED_OUTPATIENT_CLINIC_OR_DEPARTMENT_OTHER): Payer: Medicare Other

## 2013-03-13 DIAGNOSIS — Z5111 Encounter for antineoplastic chemotherapy: Secondary | ICD-10-CM

## 2013-03-13 DIAGNOSIS — C3491 Malignant neoplasm of unspecified part of right bronchus or lung: Secondary | ICD-10-CM

## 2013-03-13 DIAGNOSIS — Z5112 Encounter for antineoplastic immunotherapy: Secondary | ICD-10-CM

## 2013-03-13 DIAGNOSIS — C3410 Malignant neoplasm of upper lobe, unspecified bronchus or lung: Secondary | ICD-10-CM

## 2013-03-13 LAB — CBC WITH DIFFERENTIAL/PLATELET
EOS%: 2.6 % (ref 0.0–7.0)
Eosinophils Absolute: 0.2 10*3/uL (ref 0.0–0.5)
HGB: 9.3 g/dL — ABNORMAL LOW (ref 13.0–17.1)
LYMPH%: 24.3 % (ref 14.0–49.0)
MCH: 37.3 pg — ABNORMAL HIGH (ref 27.2–33.4)
MCHC: 33.3 g/dL (ref 32.0–36.0)
MCV: 112 fL — ABNORMAL HIGH (ref 79.3–98.0)
MONO%: 9.6 % (ref 0.0–14.0)
NEUT#: 4.6 10*3/uL (ref 1.5–6.5)
Platelets: 171 10*3/uL (ref 140–400)
RBC: 2.49 10*6/uL — ABNORMAL LOW (ref 4.20–5.82)
lymph#: 1.8 10*3/uL (ref 0.9–3.3)
nRBC: 0 % (ref 0–0)

## 2013-03-13 LAB — COMPREHENSIVE METABOLIC PANEL (CC13)
ALT: 16 U/L (ref 0–55)
AST: 27 U/L (ref 5–34)
Albumin: 3.4 g/dL — ABNORMAL LOW (ref 3.5–5.0)
Anion Gap: 8 mEq/L (ref 3–11)
BUN: 21 mg/dL (ref 7.0–26.0)
Calcium: 9.8 mg/dL (ref 8.4–10.4)
Chloride: 109 mEq/L (ref 98–109)
Creatinine: 1.5 mg/dL — ABNORMAL HIGH (ref 0.7–1.3)
Sodium: 139 mEq/L (ref 136–145)

## 2013-03-13 LAB — UA PROTEIN, DIPSTICK - CHCC: Protein, ur: 30 mg/dL

## 2013-03-13 MED ORDER — SODIUM CHLORIDE 0.9 % IV SOLN
Freq: Once | INTRAVENOUS | Status: AC
  Administered 2013-03-13: 13:00:00 via INTRAVENOUS

## 2013-03-13 MED ORDER — DEXAMETHASONE SODIUM PHOSPHATE 10 MG/ML IJ SOLN
10.0000 mg | Freq: Once | INTRAMUSCULAR | Status: AC
  Administered 2013-03-13: 10 mg via INTRAVENOUS

## 2013-03-13 MED ORDER — SODIUM CHLORIDE 0.9 % IV SOLN
500.0000 mg/m2 | Freq: Once | INTRAVENOUS | Status: AC
  Administered 2013-03-13: 850 mg via INTRAVENOUS
  Filled 2013-03-13: qty 34

## 2013-03-13 MED ORDER — DEXAMETHASONE SODIUM PHOSPHATE 10 MG/ML IJ SOLN
INTRAMUSCULAR | Status: AC
  Filled 2013-03-13: qty 1

## 2013-03-13 MED ORDER — CYANOCOBALAMIN 1000 MCG/ML IJ SOLN: 1000.0000 ug | Freq: Once | INTRAMUSCULAR | Status: DC

## 2013-03-13 MED ORDER — ONDANSETRON 8 MG/50ML IVPB (CHCC)
8.0000 mg | Freq: Once | INTRAVENOUS | Status: AC
  Administered 2013-03-13: 8 mg via INTRAVENOUS

## 2013-03-13 MED ORDER — SODIUM CHLORIDE 0.9 % IJ SOLN
10.0000 mL | INTRAMUSCULAR | Status: DC | PRN
  Administered 2013-03-13: 10 mL
  Filled 2013-03-13: qty 10

## 2013-03-13 MED ORDER — BEVACIZUMAB CHEMO INJECTION 400 MG/16ML
15.0000 mg/kg | Freq: Once | INTRAVENOUS | Status: AC
  Administered 2013-03-13: 975 mg via INTRAVENOUS
  Filled 2013-03-13: qty 39

## 2013-03-13 MED ORDER — ONDANSETRON 8 MG/NS 50 ML IVPB
INTRAVENOUS | Status: AC
  Filled 2013-03-13: qty 8

## 2013-03-13 MED ORDER — HEPARIN SOD (PORK) LOCK FLUSH 100 UNIT/ML IV SOLN
500.0000 [IU] | Freq: Once | INTRAVENOUS | Status: AC | PRN
  Administered 2013-03-13: 500 [IU]
  Filled 2013-03-13: qty 5

## 2013-03-13 NOTE — Patient Instructions (Addendum)
[facility] Discharge Instructions for Patients Receiving Chemotherapy  Today you received the following chemotherapy agents: Alimta and Avastin.  To help prevent nausea and vomiting after your treatment, we encourage you to take your nausea medication Compazine 10 mg every 6 hours as needed. If you develop nausea and vomiting that is not controlled by your nausea medication, call the clinic.   BELOW ARE SYMPTOMS THAT SHOULD BE REPORTED IMMEDIATELY:  *FEVER GREATER THAN 100.5 F  *CHILLS WITH OR WITHOUT FEVER  NAUSEA AND VOMITING THAT IS NOT CONTROLLED WITH YOUR NAUSEA MEDICATION  *UNUSUAL SHORTNESS OF BREATH  *UNUSUAL BRUISING OR BLEEDING  TENDERNESS IN MOUTH AND THROAT WITH OR WITHOUT PRESENCE OF ULCERS  *URINARY PROBLEMS  *BOWEL PROBLEMS  UNUSUAL RASH Items with * indicate a potential emergency and should be followed up as soon as possible.  Feel free to call the clinic you have any questions or concerns. The clinic phone number is [phone].

## 2013-04-02 ENCOUNTER — Ambulatory Visit (HOSPITAL_COMMUNITY)
Admission: RE | Admit: 2013-04-02 | Discharge: 2013-04-02 | Disposition: A | Discharge status: Home / Self Care | Payer: Medicare Other | Source: Ambulatory Visit | Attending: Internal Medicine | Admitting: Internal Medicine

## 2013-04-02 ENCOUNTER — Encounter (HOSPITAL_COMMUNITY): Payer: Self-pay

## 2013-04-02 DIAGNOSIS — J984 Other disorders of lung: Secondary | ICD-10-CM | POA: Insufficient documentation

## 2013-04-02 DIAGNOSIS — C3410 Malignant neoplasm of upper lobe, unspecified bronchus or lung: Secondary | ICD-10-CM | POA: Insufficient documentation

## 2013-04-02 DIAGNOSIS — Y842 Radiological procedure and radiotherapy as the cause of abnormal reaction of the patient, or of later complication, without mention of misadventure at the time of the procedure: Secondary | ICD-10-CM | POA: Insufficient documentation

## 2013-04-02 DIAGNOSIS — Z923 Personal history of irradiation: Secondary | ICD-10-CM | POA: Insufficient documentation

## 2013-04-02 DIAGNOSIS — J438 Other emphysema: Secondary | ICD-10-CM | POA: Insufficient documentation

## 2013-04-02 DIAGNOSIS — Z9221 Personal history of antineoplastic chemotherapy: Secondary | ICD-10-CM | POA: Insufficient documentation

## 2013-04-02 DIAGNOSIS — N281 Cyst of kidney, acquired: Secondary | ICD-10-CM | POA: Insufficient documentation

## 2013-04-02 DIAGNOSIS — I77811 Abdominal aortic ectasia: Secondary | ICD-10-CM | POA: Insufficient documentation

## 2013-04-02 DIAGNOSIS — T66XXXS Radiation sickness, unspecified, sequela: Secondary | ICD-10-CM | POA: Insufficient documentation

## 2013-04-02 DIAGNOSIS — R599 Enlarged lymph nodes, unspecified: Secondary | ICD-10-CM | POA: Insufficient documentation

## 2013-04-02 DIAGNOSIS — R918 Other nonspecific abnormal finding of lung field: Secondary | ICD-10-CM | POA: Insufficient documentation

## 2013-04-02 DIAGNOSIS — I708 Atherosclerosis of other arteries: Secondary | ICD-10-CM | POA: Insufficient documentation

## 2013-04-02 DIAGNOSIS — I2510 Atherosclerotic heart disease of native coronary artery without angina pectoris: Secondary | ICD-10-CM | POA: Insufficient documentation

## 2013-04-03 ENCOUNTER — Ambulatory Visit (HOSPITAL_BASED_OUTPATIENT_CLINIC_OR_DEPARTMENT_OTHER): Payer: Medicare Other

## 2013-04-03 ENCOUNTER — Ambulatory Visit (HOSPITAL_BASED_OUTPATIENT_CLINIC_OR_DEPARTMENT_OTHER): Payer: Medicare Other | Admitting: Internal Medicine

## 2013-04-03 ENCOUNTER — Telehealth: Payer: Self-pay | Admitting: Internal Medicine

## 2013-04-03 ENCOUNTER — Encounter: Payer: Self-pay | Admitting: Internal Medicine

## 2013-04-03 ENCOUNTER — Other Ambulatory Visit (HOSPITAL_BASED_OUTPATIENT_CLINIC_OR_DEPARTMENT_OTHER): Payer: Medicare Other

## 2013-04-03 DIAGNOSIS — R0989 Other specified symptoms and signs involving the circulatory and respiratory systems: Secondary | ICD-10-CM

## 2013-04-03 DIAGNOSIS — M7989 Other specified soft tissue disorders: Secondary | ICD-10-CM

## 2013-04-03 DIAGNOSIS — Z5111 Encounter for antineoplastic chemotherapy: Secondary | ICD-10-CM

## 2013-04-03 DIAGNOSIS — R0609 Other forms of dyspnea: Secondary | ICD-10-CM

## 2013-04-03 DIAGNOSIS — Z5112 Encounter for antineoplastic immunotherapy: Secondary | ICD-10-CM

## 2013-04-03 DIAGNOSIS — C3410 Malignant neoplasm of upper lobe, unspecified bronchus or lung: Secondary | ICD-10-CM

## 2013-04-03 DIAGNOSIS — Z452 Encounter for adjustment and management of vascular access device: Secondary | ICD-10-CM

## 2013-04-03 LAB — CBC WITH DIFFERENTIAL/PLATELET
BASO%: 0.2 % (ref 0.0–2.0)
Basophils Absolute: 0 10*3/uL (ref 0.0–0.1)
EOS%: 1.7 % (ref 0.0–7.0)
Eosinophils Absolute: 0.1 10*3/uL (ref 0.0–0.5)
HEMATOCRIT: 30.6 % — AB (ref 38.4–49.9)
HGB: 10.1 g/dL — ABNORMAL LOW (ref 13.0–17.1)
LYMPH%: 18.8 % (ref 14.0–49.0)
MCH: 37 pg — AB (ref 27.2–33.4)
MCHC: 33 g/dL (ref 32.0–36.0)
MCV: 112.1 fL — ABNORMAL HIGH (ref 79.3–98.0)
MONO#: 0.6 10*3/uL (ref 0.1–0.9)
MONO%: 10.2 % (ref 0.0–14.0)
NEUT#: 4.2 10*3/uL (ref 1.5–6.5)
NEUT%: 69.1 % (ref 39.0–75.0)
NRBC: 0 % (ref 0–0)
PLATELETS: 126 10*3/uL — AB (ref 140–400)
RBC: 2.73 10*6/uL — AB (ref 4.20–5.82)
RDW: 16.7 % — ABNORMAL HIGH (ref 11.0–14.6)
WBC: 6.1 10*3/uL (ref 4.0–10.3)
lymph#: 1.1 10*3/uL (ref 0.9–3.3)

## 2013-04-03 LAB — COMPREHENSIVE METABOLIC PANEL (CC13)
ALK PHOS: 92 U/L (ref 40–150)
ALT: 23 U/L (ref 0–55)
AST: 33 U/L (ref 5–34)
Albumin: 3.8 g/dL (ref 3.5–5.0)
Anion Gap: 11 mEq/L (ref 3–11)
BILIRUBIN TOTAL: 0.44 mg/dL (ref 0.20–1.20)
BUN: 20.8 mg/dL (ref 7.0–26.0)
CO2: 20 mEq/L — ABNORMAL LOW (ref 22–29)
CREATININE: 1.6 mg/dL — AB (ref 0.7–1.3)
Calcium: 9.9 mg/dL (ref 8.4–10.4)
Chloride: 108 mEq/L (ref 98–109)
Glucose: 131 mg/dl (ref 70–140)
Potassium: 4.1 mEq/L (ref 3.5–5.1)
Sodium: 139 mEq/L (ref 136–145)
Total Protein: 7.4 g/dL (ref 6.4–8.3)

## 2013-04-03 LAB — UA PROTEIN, DIPSTICK - CHCC: Protein, ur: 300 mg/dL

## 2013-04-03 MED ORDER — ONDANSETRON 8 MG/NS 50 ML IVPB
INTRAVENOUS | Status: AC
  Filled 2013-04-03: qty 8

## 2013-04-03 MED ORDER — SODIUM CHLORIDE 0.9 % IV SOLN
Freq: Once | INTRAVENOUS | Status: AC
  Administered 2013-04-03: 13:00:00 via INTRAVENOUS

## 2013-04-03 MED ORDER — ALTEPLASE 2 MG IJ SOLR
2.0000 mg | Freq: Once | INTRAMUSCULAR | Status: AC | PRN
  Administered 2013-04-03: 2 mg
  Filled 2013-04-03: qty 2

## 2013-04-03 MED ORDER — HEPARIN SOD (PORK) LOCK FLUSH 100 UNIT/ML IV SOLN
500.0000 [IU] | Freq: Once | INTRAVENOUS | Status: AC | PRN
  Administered 2013-04-03: 500 [IU]
  Filled 2013-04-03: qty 5

## 2013-04-03 MED ORDER — DEXAMETHASONE SODIUM PHOSPHATE 10 MG/ML IJ SOLN
INTRAMUSCULAR | Status: AC
  Filled 2013-04-03: qty 1

## 2013-04-03 MED ORDER — SODIUM CHLORIDE 0.9 % IJ SOLN
10.0000 mL | INTRAMUSCULAR | Status: DC | PRN
  Administered 2013-04-03: 10 mL
  Filled 2013-04-03: qty 10

## 2013-04-03 MED ORDER — SODIUM CHLORIDE 0.9 % IV SOLN
500.0000 mg/m2 | Freq: Once | INTRAVENOUS | Status: AC
  Administered 2013-04-03: 850 mg via INTRAVENOUS
  Filled 2013-04-03: qty 34

## 2013-04-03 MED ORDER — DEXAMETHASONE SODIUM PHOSPHATE 10 MG/ML IJ SOLN
10.0000 mg | Freq: Once | INTRAMUSCULAR | Status: AC
  Administered 2013-04-03: 10 mg via INTRAVENOUS

## 2013-04-03 MED ORDER — ONDANSETRON 8 MG/50ML IVPB (CHCC)
8.0000 mg | Freq: Once | INTRAVENOUS | Status: AC
  Administered 2013-04-03: 8 mg via INTRAVENOUS

## 2013-04-03 MED ORDER — CYANOCOBALAMIN 1000 MCG/ML IJ SOLN
1000.0000 ug | Freq: Once | INTRAMUSCULAR | Status: AC
  Administered 2013-04-03: 1000 ug via INTRAMUSCULAR

## 2013-04-03 MED ORDER — CYANOCOBALAMIN 1000 MCG/ML IJ SOLN
INTRAMUSCULAR | Status: AC
  Filled 2013-04-03: qty 1

## 2013-04-03 MED ORDER — SODIUM CHLORIDE 0.9 % IV SOLN
15.0000 mg/kg | Freq: Once | INTRAVENOUS | Status: AC
  Administered 2013-04-03: 975 mg via INTRAVENOUS
  Filled 2013-04-03: qty 39

## 2013-04-03 NOTE — Progress Notes (Signed)
1311: Cathflo aspirated, brisk blood return noted. Lab obtained, PAC flushed without difficulty. PIV established by Lavella Lemons RN, prior to getting blood return from [facility], patient to receive treatment via R FA PIV. PAC to be flushed and deaccessed per protocol at the end of treatment.

## 2013-04-03 NOTE — Progress Notes (Signed)
[place] [phone]   Fax:(336) [place], MD [address] [place] [address]  DIAGNOSIS AND DIAGNOSIS: Metastatic non-small cell lung cancer adenocarcinoma diagnosed in September 2010   PRIOR THERAPY:  #1 status post 6 cycles of systemic chemotherapy with carboplatin, Alimta and Avastin given every 3 weeks last dose given 04/23/2009 with disease stabilization.  #2 status post palliative radiotherapy to the mediastinum under the care of Dr. Pablo Ledger. The patient received a total dose of 3000 cGY and 12 fractions completed 01/21/2009.  #3 Maintenance chemotherapy with Alimta at 500 mg per meter squared and Avastin at 15 mg per kilogram given every 3 weeks status post 35 cycles.  #4 Maintenance chemotherapy with Alimta 500 mg/M2 and Avastin 15 mg/kg every 4 weeks,status post 17 cycles, discontinued secondary to disease progression.  #5 Systemic chemotherapy with carboplatin for AUC of 5, Alimta 500 mg/M2 and Avastin 15 mg/kg every 3 weeks, status post 3 cycles, last cycle was given 12/28/2012. Carboplatin was discontinued starting cycle #2 secondary to hypersensitivity reaction.   CURRENT THERAPY: Maintenance chemotherapy again with Alimta 500 mg/M2 and Avastin 15 mg/kg every 3 weeks, first cycle 01/23/2013. Status post 3 cycles.  CHEMOTHERAPY INTENT: palliative/maintenance.  CURRENT # OF CHEMOTHERAPY CYCLES: 4 CURRENT ANTIEMETICS: Compazine when necessary but clearly used.  CURRENT SMOKING STATUS: current nonsmoker  ORAL CHEMOTHERAPY AND CONSENT: None.  CURRENT BISPHOSPHONATES USE: None.  PAIN MANAGEMENT: No Pain  NARCOTICS INDUCED CONSTIPATION: N/A  LIVING WILL AND CODE STATUS: NCB   INTERVAL HISTORY:  [name] 68 y.o. male returns to the clinic today for follow up visit accompanied by his wife. He has no significant complaints today and tolerating his treatment fairly well. He denied  having any significant chest pain but continues to have shortness breath with exertion with no cough or hemoptysis. He has mild swelling of the lower extremity especially by the end of the day. He denied having any significant fever or chills. He has no nausea or vomiting. He had repeat CT scan of the chest, abdomen and pelvis performed recently and he is here for evaluation and discussion of his scan results.  MEDICAL HISTORY: Past Medical History  Diagnosis Date  . Hypertension   . Arthritis RIGHT SHOULDER  . Immature cataract BILATERAL  . Non-small cell lung cancer DX SEPT 2010  W/ CHEMORADIATION AT THAT TIME -- NOW  W/ METS--  CURRENTLY ON MAINTENANCE CHEMO TX  EVERY 30 DAYS    ONCOLOGIST- DR [facility]  . Acute meniscal tear of knee RIGHT KNEE  . BPH (benign prostatic hypertrophy)     ALLERGIES:  is allergic to carboplatin; percocet; and zolpidem tartrate.  MEDICATIONS:  Current Outpatient Prescriptions  Medication Sig Dispense Refill  . amLODipine (NORVASC) 2.5 MG tablet Take 2.5 mg by mouth every morning.       . Ascorbic Acid (VITAMIN C) 1000 MG tablet Take 1,000 mg by mouth daily.      Marland Kitchen atenolol (TENORMIN) 100 MG tablet Take 100 mg by mouth every morning.       . B Complex-Biotin-FA (B-COMPLEX PO) Take 1 tablet by mouth daily. 1500mg  daily      . doxazosin (CARDURA) 2 MG tablet Take 2 mg by mouth at bedtime.       . FeFum-FePoly-FA-B Cmp-C-Biot (FOLIVANE-PLUS) CAPS Take 1 capsule by mouth daily.  90 capsule  3  . fexofenadine (ALLEGRA) 180 MG tablet Take 180 mg  by mouth daily as needed. Allergies      . folic acid (FOLVITE) 1 MG tablet Take 1 tablet (1 mg total) by mouth daily.  90 tablet  1  . HYDROcodone-homatropine (HYCODAN) 5-1.5 MG/5ML syrup Take 5 mLs by mouth every 6 (six) hours as needed for cough.  120 mL  0  . Multiple Vitamin (MULTIVITAMIN) tablet Take 1 tablet by mouth daily.       Marland Kitchen omeprazole (PRILOSEC) 40 MG capsule Take 1 capsule (40 mg total) by mouth every  evening.  90 capsule  3  . prochlorperazine (COMPAZINE) 10 MG tablet Take 1 tablet (10 mg total) by mouth every 6 (six) hours as needed.  30 tablet  0  . Sennosides-Docusate Sodium (SENOKOT S PO) Take 2 tablets by mouth daily. 2 tabs daily       No current facility-administered medications for this visit.    SURGICAL HISTORY:  Past Surgical History  Procedure Laterality Date  . 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 Rotator cuff repair  2008    LEFT SHOULDER  . Bilateral ear drum surgery  1960'S  . Orif left ring finger  and revascularization of radial side  02-16-2007    DEGLOVING INJURY  . Knee arthroscopy  12/21/2011    Procedure: ARTHROSCOPY KNEE;  Surgeon: Tobi Bastos, MD;  Location: [facility];  Service: Orthopedics;  Laterality: Right;  WITH MEDIAL MENISECTOMY    REVIEW OF SYSTEMS:  Constitutional: positive for fatigue Eyes: negative Ears, nose, mouth, throat, and face: negative Respiratory: negative Cardiovascular: negative Gastrointestinal: negative Genitourinary:negative Integument/breast: negative Hematologic/lymphatic: negative Musculoskeletal:negative Neurological: negative Behavioral/Psych: negative Endocrine: negative Allergic/Immunologic: negative   PHYSICAL EXAMINATION: General appearance: alert, cooperative, fatigued and no distress Head: Normocephalic, without obvious abnormality, atraumatic Neck: no adenopathy, no JVD, supple, symmetrical, trachea midline and thyroid not enlarged, symmetric, no tenderness/mass/nodules Lymph nodes: Cervical, supraclavicular, and axillary nodes normal. Resp: clear to auscultation bilaterally Back: symmetric, no curvature. ROM normal. No CVA tenderness. Cardio: regular rate and rhythm, S1, S2 normal, no murmur, click, rub or gallop GI: soft, non-tender; bowel sounds normal; no masses,  no organomegaly Extremities: extremities normal,  atraumatic, no cyanosis or edema Neurologic: Alert and oriented X 3, normal strength and tone. Normal symmetric reflexes. Normal coordination and gait  ECOG PERFORMANCE STATUS: 1 - Symptomatic but completely ambulatory  Blood pressure 143/85, pulse 78, temperature 96.8 F (36 C), resp. rate 18, height 5\' 7"  (1.702 m), weight 137 lb (62.143 kg), SpO2 100.00%.  LABORATORY DATA: Lab Results  Component Value Date   WBC 6.1 04/03/2013   HGB 10.1* 04/03/2013   HCT 30.6* 04/03/2013   MCV 112.1* 04/03/2013   PLT 126* 04/03/2013      Chemistry      Component Value Date/Time   NA 139 03/13/2013 1225   NA 140 12/21/2011 1252   K 4.3 03/13/2013 1225   K 3.3* 12/21/2011 1252   CL 109* 08/29/2012 0856   CL 101 11/18/2011 0858   CO2 22 03/13/2013 1225   CO2 22 11/18/2011 0858   BUN 21.0 03/13/2013 1225   BUN 19 11/18/2011 0858   CREATININE 1.5* 03/13/2013 1225   CREATININE 1.34 11/18/2011 0858      Component Value Date/Time   CALCIUM 9.8 03/13/2013 1225   CALCIUM 9.5 11/18/2011 0858   ALKPHOS 81 03/13/2013 1225   ALKPHOS 55 11/18/2011 0858   AST 27 03/13/2013 1225  AST 25 11/18/2011 0858   ALT 16 03/13/2013 1225   ALT 17 11/18/2011 0858   BILITOT 0.44 03/13/2013 1225   BILITOT 0.5 11/18/2011 0858       RADIOGRAPHIC STUDIES:  Ct Chest Wo Contrast  04/02/2013   CLINICAL DATA:  Right upper lobe lung cancer. Prior chemotherapy and radiation therapy. Current chemotherapy.  EXAM: CT CHEST, ABDOMEN AND PELVIS WITHOUT CONTRAST  TECHNIQUE: Multidetector CT imaging of the chest, abdomen and pelvis was performed following the standard protocol without IV contrast.  COMPARISON:  01/16/2013  FINDINGS:   CT CHEST FINDINGS  Right Port-A-Cath tip:  SVC.  Radiation therapy related scarring, right upper lobe medially, with dystrophic calcification. No change in contour or size of region of elevated density to suggest recurrent tumor in this location. Stable stranding in the mediastinum with scattered small mediastinal  lymph nodes, not changed. Coronary artery atherosclerosis. Stable small amount of anterior pericardial fluid. Faint opacity inferiorly in the right middle lobe likely reflects atelectasis but merits observation. There is some mild ground-glass nodularity peripherally in the right middle lobe on image 41 of series 4, including a 0.6 x 0.4 cm ground-glass density nodule.  Left apical scarring noted. Emphysema is present. Calcified granuloma, left lower lobe along the pleural margin, stable.    CT ABDOMEN AND PELVIS FINDINGS  Stable chronic bilateral para renal stranding. The noncontrast CT appearance of the liver, spleen, pancreas, and adrenal glands is within normal limits. Aortoiliac atherosclerotic calcification noted with mildly ectatic abdominal aorta. Indistinctly marginated aortic caval lymph node 1.0 cm in diameter, previously the same, this lymph node was previously hypermetabolic on the PET-CT from 09/19/2012. If additional smaller aortic caval and retroperitoneal lymph nodes are present including a 0.9 cm aortic caval node on image 77 of series 2  Urinary bladder unremarkable.  Slightly thickened wall of the left collecting system, stable the without an obvious focal mass. Stable appearance of small left renal cyst.    IMPRESSION: 1. Compared to the prior exam there is some faint ground-glass nodularity in the right middle lobe which is probably inflammatory but which warrants careful imaging observation to exclude progression/low grade malignancy. 2. Post therapy related findings medially in the right upper lobe, stable. 3. There is mild retroperitoneal adenopathy. On the prior PET-CT of 09/19/2012, this was shown to be hypermetabolic, suspicious for residual malignancy, but is not progressive. 4. Atherosclerosis. 5. Stable prints of chronic prominent perirenal stranding, and slight wall thickening in the collecting systems, left greater than right, of uncertain significance.   Electronically Signed    By: Sherryl Barters M.D.   On: 04/02/2013 08:37   ASSESSMENT AND PLAN: This is a very pleasant 68 years old white male with metastatic non-small cell lung cancer, adenocarcinoma currently undergoing systemic chemotherapy with Alimta and Avastin status post 3 cycles. The patient tolerated the last cycle of his treatment fairly well.  His recent CT scan of the chest, abdomen and pelvis showed no significant evidence for disease progression. I discussed the scan results with the patient and his wife. I recommended for him to continue his current treatment with maintenance Alimta and Avastin. The patient will start cycle #4 today. He was advised to call immediately if he has any concerning symptoms in the interval. The patient voices understanding of current disease status and treatment options and is in agreement with the current care plan.  All questions were answered. The patient knows to call the clinic with any problems, questions or concerns. We can  certainly see the patient much sooner if necessary.  I spent 15 minutes counseling the patient face to face. The total time spent in the appointment was 25 minutes.

## 2013-04-03 NOTE — Patient Instructions (Signed)
CURRENT THERAPY: Maintenance chemotherapy again with Alimta 500 mg/M2 and Avastin 15 mg/kg every 3 weeks, first cycle 01/23/2013. Status post 3 cycles.  CHEMOTHERAPY INTENT: palliative/maintenance.  CURRENT # OF CHEMOTHERAPY CYCLES: 4  CURRENT ANTIEMETICS: Compazine when necessary but clearly used.  CURRENT SMOKING STATUS: current nonsmoker  ORAL CHEMOTHERAPY AND CONSENT: None.  CURRENT BISPHOSPHONATES USE: None.  PAIN MANAGEMENT: No Pain  NARCOTICS INDUCED CONSTIPATION: N/A  LIVING WILL AND CODE STATUS: NCB

## 2013-04-03 NOTE — Telephone Encounter (Signed)
gv adn printed appt sched and avs fo rpt for Jan 2015....sed added tx.

## 2013-04-03 NOTE — Progress Notes (Signed)
Per Dr. [name] via Rudene Anda, RN, OK to treat with Avastin with urine protein 300.  1104: PAC accessed without difficulty, NS flushed without resistance or patient complaints, no blood return noted. cathflo instilled per policy. Will monitor patient.   1134: No blood return noted; Cathflo remains instilled. Will monitor.  1200: No blood return noted; cathflo remains instilled. Will continue to monitor.   1234: No blood return noted; Cathflo remains instilled per policy. Will continue to monitor.

## 2013-04-03 NOTE — Patient Instructions (Signed)
[place] Discharge Instructions for Patients Receiving Chemotherapy  Today you received the following chemotherapy agents: Avastin and Alimta   To help prevent nausea and vomiting after your treatment, we encourage you to take your nausea medication as prescribed.   If you develop nausea and vomiting that is not controlled by your nausea medication, call the clinic.   BELOW ARE SYMPTOMS THAT SHOULD BE REPORTED IMMEDIATELY:  *FEVER GREATER THAN 100.5 F  *CHILLS WITH OR WITHOUT FEVER  NAUSEA AND VOMITING THAT IS NOT CONTROLLED WITH YOUR NAUSEA MEDICATION  *UNUSUAL SHORTNESS OF BREATH  *UNUSUAL BRUISING OR BLEEDING  TENDERNESS IN MOUTH AND THROAT WITH OR WITHOUT PRESENCE OF ULCERS  *URINARY PROBLEMS  *BOWEL PROBLEMS  UNUSUAL RASH Items with * indicate a potential emergency and should be followed up as soon as possible.  Feel free to call the clinic you have any questions or concerns. The clinic phone number is [phone].

## 2013-04-05 ENCOUNTER — Ambulatory Visit: Payer: Medicare Other | Admitting: Internal Medicine

## 2013-04-06 ENCOUNTER — Telehealth: Payer: Self-pay | Admitting: *Deleted

## 2013-04-06 NOTE — Telephone Encounter (Signed)
Pt called stating he has a bump at the insertion site, no redness, no swelling in the forearm.  He said it feels like a little cyst.  Encouraged patient to apply heat prn and to call if it changes but just to keep watching it for now.  Dr Vista Mink aware.  Pt verbalized understanding.  SLJ

## 2013-04-09 ENCOUNTER — Telehealth: Payer: Self-pay | Admitting: Dietician

## 2013-04-09 NOTE — Telephone Encounter (Signed)
Brief Outpatient Oncology Nutrition Note  Patient has been identified to be at risk on malnutrition screen.  Wt Readings from Last 10 Encounters:  04/03/13 137 lb (62.143 kg)  03/06/13 136 lb 12.8 oz (62.052 kg)  02/13/13 138 lb 4.8 oz (62.732 kg)  01/23/13 138 lb 6.4 oz (62.778 kg)  12/28/12 135 lb 14.4 oz (61.644 kg)  11/28/12 136 lb 9.6 oz (61.961 kg)  11/07/12 139 lb 12.8 oz (63.413 kg)  10/24/12 140 lb 12.8 oz (63.866 kg)  10/05/12 137 lb (62.143 kg)  09/26/12 137 lb 8 oz (62.37 kg)    Patient with metastatic non-small cell lung cancer adenocarcinoma diagnosed in September 2010.  Patient of Dr. [name].    Called patient who states that he is eating as well as usual.  Loses weight easily.  Has Ensure but is not drinking this.  Discussed increased needs and tips to increase calories in diet.  Patient verbalized understanding.  Encouraged good intake of meals and snacks and adding Ensure or other su[pplement as needed daily.  Encouraged to call the [facility] as needed.  Antonieta Iba, RD, LDN

## 2013-04-24 ENCOUNTER — Ambulatory Visit: Payer: Medicare Other | Admitting: Physician Assistant

## 2013-04-24 ENCOUNTER — Encounter: Payer: Self-pay | Admitting: Physician Assistant

## 2013-04-24 ENCOUNTER — Ambulatory Visit (HOSPITAL_BASED_OUTPATIENT_CLINIC_OR_DEPARTMENT_OTHER): Payer: Medicare Other | Admitting: Physician Assistant

## 2013-04-24 ENCOUNTER — Telehealth: Payer: Self-pay | Admitting: *Deleted

## 2013-04-24 ENCOUNTER — Ambulatory Visit (HOSPITAL_BASED_OUTPATIENT_CLINIC_OR_DEPARTMENT_OTHER): Payer: Medicare Other

## 2013-04-24 ENCOUNTER — Other Ambulatory Visit (HOSPITAL_BASED_OUTPATIENT_CLINIC_OR_DEPARTMENT_OTHER): Payer: Medicare Other

## 2013-04-24 ENCOUNTER — Other Ambulatory Visit: Payer: Medicare Other

## 2013-04-24 ENCOUNTER — Telehealth: Payer: Self-pay | Admitting: Internal Medicine

## 2013-04-24 VITALS — BP 153/78 | HR 75 | Temp 97.3°F | Resp 18 | Ht 67.0 in | Wt 138.8 lb

## 2013-04-24 VITALS — BP 144/77 | HR 63

## 2013-04-24 DIAGNOSIS — C3491 Malignant neoplasm of unspecified part of right bronchus or lung: Secondary | ICD-10-CM

## 2013-04-24 DIAGNOSIS — C3410 Malignant neoplasm of upper lobe, unspecified bronchus or lung: Secondary | ICD-10-CM

## 2013-04-24 DIAGNOSIS — N400 Enlarged prostate without lower urinary tract symptoms: Secondary | ICD-10-CM

## 2013-04-24 DIAGNOSIS — R350 Frequency of micturition: Secondary | ICD-10-CM

## 2013-04-24 DIAGNOSIS — Z5111 Encounter for antineoplastic chemotherapy: Secondary | ICD-10-CM

## 2013-04-24 DIAGNOSIS — N401 Enlarged prostate with lower urinary tract symptoms: Secondary | ICD-10-CM

## 2013-04-24 DIAGNOSIS — Z5112 Encounter for antineoplastic immunotherapy: Secondary | ICD-10-CM

## 2013-04-24 LAB — COMPREHENSIVE METABOLIC PANEL (CC13)
ALT: 17 U/L (ref 0–55)
AST: 28 U/L (ref 5–34)
Albumin: 3.5 g/dL (ref 3.5–5.0)
Alkaline Phosphatase: 91 U/L (ref 40–150)
Anion Gap: 9 mEq/L (ref 3–11)
BILIRUBIN TOTAL: 0.45 mg/dL (ref 0.20–1.20)
BUN: 22.7 mg/dL (ref 7.0–26.0)
CO2: 22 meq/L (ref 22–29)
CREATININE: 1.8 mg/dL — AB (ref 0.7–1.3)
Calcium: 9.5 mg/dL (ref 8.4–10.4)
Chloride: 109 mEq/L (ref 98–109)
Glucose: 88 mg/dl (ref 70–140)
Potassium: 4.3 mEq/L (ref 3.5–5.1)
SODIUM: 140 meq/L (ref 136–145)
Total Protein: 6.6 g/dL (ref 6.4–8.3)

## 2013-04-24 LAB — CBC WITH DIFFERENTIAL/PLATELET
BASO%: 0.7 % (ref 0.0–2.0)
Basophils Absolute: 0 10*3/uL (ref 0.0–0.1)
EOS ABS: 0.1 10*3/uL (ref 0.0–0.5)
EOS%: 2.5 % (ref 0.0–7.0)
HCT: 27 % — ABNORMAL LOW (ref 38.4–49.9)
HGB: 8.9 g/dL — ABNORMAL LOW (ref 13.0–17.1)
LYMPH%: 29.1 % (ref 14.0–49.0)
MCH: 36.2 pg — ABNORMAL HIGH (ref 27.2–33.4)
MCHC: 33 g/dL (ref 32.0–36.0)
MCV: 109.8 fL — ABNORMAL HIGH (ref 79.3–98.0)
MONO#: 0.9 10*3/uL (ref 0.1–0.9)
MONO%: 15.1 % — AB (ref 0.0–14.0)
NEUT%: 52.6 % (ref 39.0–75.0)
NEUTROS ABS: 3 10*3/uL (ref 1.5–6.5)
Platelets: 165 10*3/uL (ref 140–400)
RBC: 2.46 10*6/uL — ABNORMAL LOW (ref 4.20–5.82)
RDW: 16.7 % — AB (ref 11.0–14.6)
WBC: 5.7 10*3/uL (ref 4.0–10.3)
lymph#: 1.7 10*3/uL (ref 0.9–3.3)

## 2013-04-24 LAB — UA PROTEIN, DIPSTICK - CHCC: PROTEIN: 100 mg/dL

## 2013-04-24 MED ORDER — ONDANSETRON 8 MG/NS 50 ML IVPB
INTRAVENOUS | Status: AC
Start: 2013-04-24 — End: 2013-04-24
  Filled 2013-04-24: qty 8

## 2013-04-24 MED ORDER — HEPARIN SOD (PORK) LOCK FLUSH 100 UNIT/ML IV SOLN
500.0000 [IU] | Freq: Once | INTRAVENOUS | Status: AC | PRN
  Administered 2013-04-24: 500 [IU]
  Filled 2013-04-24: qty 5

## 2013-04-24 MED ORDER — SODIUM CHLORIDE 0.9 % IJ SOLN
10.0000 mL | INTRAMUSCULAR | Status: DC | PRN
  Administered 2013-04-24: 10 mL
  Filled 2013-04-24: qty 10

## 2013-04-24 MED ORDER — SODIUM CHLORIDE 0.9 % IV SOLN
Freq: Once | INTRAVENOUS | Status: AC
  Administered 2013-04-24: 11:00:00 via INTRAVENOUS

## 2013-04-24 MED ORDER — BEVACIZUMAB CHEMO INJECTION 400 MG/16ML
15.0000 mg/kg | Freq: Once | INTRAVENOUS | Status: AC
  Administered 2013-04-24: 975 mg via INTRAVENOUS
  Filled 2013-04-24: qty 39

## 2013-04-24 MED ORDER — SODIUM CHLORIDE 0.9 % IV SOLN
500.0000 mg/m2 | Freq: Once | INTRAVENOUS | Status: AC
  Administered 2013-04-24: 850 mg via INTRAVENOUS
  Filled 2013-04-24: qty 34

## 2013-04-24 MED ORDER — DEXAMETHASONE SODIUM PHOSPHATE 10 MG/ML IJ SOLN
INTRAMUSCULAR | Status: AC
  Filled 2013-04-24: qty 1

## 2013-04-24 MED ORDER — DEXAMETHASONE SODIUM PHOSPHATE 10 MG/ML IJ SOLN
10.0000 mg | Freq: Once | INTRAMUSCULAR | Status: AC
Start: 2013-04-24 — End: 2013-04-24
  Administered 2013-04-24: 10 mg via INTRAVENOUS

## 2013-04-24 MED ORDER — ONDANSETRON 8 MG/50ML IVPB (CHCC)
8.0000 mg | Freq: Once | INTRAVENOUS | Status: AC
  Administered 2013-04-24: 8 mg via INTRAVENOUS

## 2013-04-24 NOTE — Patient Instructions (Signed)
You're being referred to a urologist to further evaluate your complaints of urinary frequency Followup in 3 weeks prior to her next scheduled cycle of maintenance chemotherapy

## 2013-04-24 NOTE — Progress Notes (Signed)
Ok to treat with urine protein 100 per Dr. [name]

## 2013-04-24 NOTE — Patient Instructions (Signed)
[facility] Discharge Instructions for Patients Receiving Chemotherapy  Today you received the following chemotherapy agents: Avastin/Alimta  To help prevent nausea and vomiting after your treatment, we encourage you to take your nausea medication as prescribed by your physician   If you develop nausea and vomiting that is not controlled by your nausea medication, call the clinic.   BELOW ARE SYMPTOMS THAT SHOULD BE REPORTED IMMEDIATELY:  *FEVER GREATER THAN 100.5 F  *CHILLS WITH OR WITHOUT FEVER  NAUSEA AND VOMITING THAT IS NOT CONTROLLED WITH YOUR NAUSEA MEDICATION  *UNUSUAL SHORTNESS OF BREATH  *UNUSUAL BRUISING OR BLEEDING  TENDERNESS IN MOUTH AND THROAT WITH OR WITHOUT PRESENCE OF ULCERS  *URINARY PROBLEMS  *BOWEL PROBLEMS  UNUSUAL RASH Items with * indicate a potential emergency and should be followed up as soon as possible.  Feel free to call the clinic you have any questions or concerns. The clinic phone number is [phone].

## 2013-04-24 NOTE — Progress Notes (Addendum)
[facility] [phone]   [phone]  SHARED VISIT PROGRESS NOTE  [name], MD [address] [place] [address]  DIAGNOSIS AND DIAGNOSIS: Metastatic non-small cell lung cancer adenocarcinoma diagnosed in September 2010   PRIOR THERAPY:  #1 status post 6 cycles of systemic chemotherapy with carboplatin, Alimta and Avastin given every 3 weeks last dose given 04/23/2009 with disease stabilization.  #2 status post palliative radiotherapy to the mediastinum under the care of Dr. Pablo Ledger. The patient received a total dose of 3000 cGY and 12 fractions completed 01/21/2009.  #3 Maintenance chemotherapy with Alimta at 500 mg per meter squared and Avastin at 15 mg per kilogram given every 3 weeks status post 35 cycles.  #4 Maintenance chemotherapy with Alimta 500 mg/M2 and Avastin 15 mg/kg every 4 weeks,status post 17 cycles, discontinued secondary to disease progression.  #5 Systemic chemotherapy with carboplatin for AUC of 5, Alimta 500 mg/M2 and Avastin 15 mg/kg every 3 weeks, status post 3 cycles, last cycle was given 12/28/2012. Carboplatin was discontinued starting cycle #2 secondary to hypersensitivity reaction.   CURRENT THERAPY: Maintenance chemotherapy again with Alimta 500 mg/M2 and Avastin 15 mg/kg every 3 weeks, first cycle 01/23/2013. Status post 4 cycles.  CHEMOTHERAPY INTENT: palliative/maintenance.  CURRENT # OF CHEMOTHERAPY CYCLES: 5 CURRENT ANTIEMETICS: Compazine when necessary but clearly used.  CURRENT SMOKING STATUS: current nonsmoker  ORAL CHEMOTHERAPY AND CONSENT: None.  CURRENT BISPHOSPHONATES USE: None.  PAIN MANAGEMENT: No Pain  NARCOTICS INDUCED CONSTIPATION: N/A  LIVING WILL AND CODE STATUS: NCB   INTERVAL HISTORY:  [name] 68 y.o. male returns to the clinic today for follow up visit accompanied by his wife. He complains of urinary frequency at night. He is on Cardura 2 mg at bedtime. He states  that he was told about 3 years ago that he had an enlarged prostate. He notes that he feels cold all the time. He also complains of increased fatigue and increased shortness of breath with exertion. He states that he has a history of hemorrhoids which seemed to be aggravated by his chemotherapy, likely the Avastin. He is not having any overt rectal bleeding. He denied chest pain, cough or hemoptysis.  He denied having any significant fever or chills. He has no nausea or vomiting.   MEDICAL HISTORY: Past Medical History  Diagnosis Date  . Hypertension   . Arthritis RIGHT SHOULDER  . Immature cataract BILATERAL  . Non-small cell lung cancer DX SEPT 2010  W/ CHEMORADIATION AT THAT TIME -- NOW  W/ METS--  CURRENTLY ON MAINTENANCE CHEMO TX  EVERY 30 DAYS    ONCOLOGIST- DR [facility]  . Acute meniscal tear of knee RIGHT KNEE  . BPH (benign prostatic hypertrophy)     ALLERGIES:  is allergic to carboplatin; percocet; and zolpidem tartrate.  MEDICATIONS:  Current Outpatient Prescriptions  Medication Sig Dispense Refill  . amLODipine (NORVASC) 2.5 MG tablet Take 2.5 mg by mouth every morning.       . Ascorbic Acid (VITAMIN C) 1000 MG tablet Take 1,000 mg by mouth daily.      Marland Kitchen atenolol (TENORMIN) 100 MG tablet Take 100 mg by mouth every morning.       . B Complex-Biotin-FA (B-COMPLEX PO) Take 1 tablet by mouth daily. 1500mg  daily      . cetirizine (ZYRTEC) 10 MG tablet Take 10 mg by mouth daily.      Marland Kitchen doxazosin (CARDURA) 2 MG tablet Take 2 mg  by mouth at bedtime.       . FeFum-FePoly-FA-B Cmp-C-Biot (FOLIVANE-PLUS) CAPS Take 1 capsule by mouth daily.  90 capsule  3  . folic acid (FOLVITE) 1 MG tablet Take 1 tablet (1 mg total) by mouth daily.  90 tablet  1  . HYDROcodone-homatropine (HYCODAN) 5-1.5 MG/5ML syrup Take 5 mLs by mouth every 6 (six) hours as needed for cough.  120 mL  0  . Multiple Vitamin (MULTIVITAMIN) tablet Take 1 tablet by mouth daily.       Marland Kitchen omeprazole (PRILOSEC) 40 MG capsule  Take 1 capsule (40 mg total) by mouth every evening.  90 capsule  3  . Sennosides-Docusate Sodium (SENOKOT S PO) Take 2 tablets by mouth daily. 2 tabs daily      . prochlorperazine (COMPAZINE) 10 MG tablet Take 1 tablet (10 mg total) by mouth every 6 (six) hours as needed.  30 tablet  0   No current facility-administered medications for this visit.    SURGICAL HISTORY:  Past Surgical History  Procedure Laterality Date  . 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 Rotator cuff repair  2008    LEFT SHOULDER  . Bilateral ear drum surgery  1960'S  . Orif left ring finger  and revascularization of radial side  02-16-2007    DEGLOVING INJURY  . Knee arthroscopy  12/21/2011    Procedure: ARTHROSCOPY KNEE;  Surgeon: Tobi Bastos, MD;  Location: [facility];  Service: Orthopedics;  Laterality: Right;  WITH MEDIAL MENISECTOMY    REVIEW OF SYSTEMS:  Constitutional: positive for fatigue Eyes: negative Ears, nose, mouth, throat, and face: negative Respiratory: positive for dyspnea on exertion Cardiovascular: negative Gastrointestinal: negative Genitourinary:positive for frequency and nocturia Integument/breast: negative Hematologic/lymphatic: negative Musculoskeletal:negative Neurological: negative Behavioral/Psych: negative Endocrine: negative Allergic/Immunologic: negative   PHYSICAL EXAMINATION: General appearance: alert, cooperative, fatigued and no distress Head: Normocephalic, without obvious abnormality, atraumatic Neck: no adenopathy, no JVD, supple, symmetrical, trachea midline and thyroid not enlarged, symmetric, no tenderness/mass/nodules Lymph nodes: Cervical, supraclavicular, and axillary nodes normal. Resp: clear to auscultation bilaterally Back: symmetric, no curvature. ROM normal. No CVA tenderness. Cardio: regular rate and rhythm, S1, S2 normal, no murmur, click, rub or gallop GI: soft,  non-tender; bowel sounds normal; no masses,  no organomegaly Extremities: extremities normal, atraumatic, no cyanosis or edema Neurologic: Alert and oriented X 3, normal strength and tone. Normal symmetric reflexes. Normal coordination and gait  ECOG PERFORMANCE STATUS: 1 - Symptomatic but completely ambulatory  Blood pressure 153/78, pulse 75, temperature 97.3 F (36.3 C), resp. rate 18, height 5\' 7"  (1.702 m), weight 138 lb 12.8 oz (62.959 kg), SpO2 100.00%.  LABORATORY DATA: Lab Results  Component Value Date   WBC 5.7 04/24/2013   HGB 8.9* 04/24/2013   HCT 27.0* 04/24/2013   MCV 109.8* 04/24/2013   PLT 165 04/24/2013      Chemistry      Component Value Date/Time   NA 140 04/24/2013 0953   NA 140 12/21/2011 1252   K 4.3 04/24/2013 0953   K 3.3* 12/21/2011 1252   CL 109* 08/29/2012 0856   CL 101 11/18/2011 0858   CO2 22 04/24/2013 0953   CO2 22 11/18/2011 0858   BUN 22.7 04/24/2013 0953   BUN 19 11/18/2011 0858   CREATININE 1.8* 04/24/2013 0953   CREATININE 1.34 11/18/2011 0858      Component Value Date/Time   CALCIUM 9.5  04/24/2013 0953   CALCIUM 9.5 11/18/2011 0858   ALKPHOS 91 04/24/2013 0953   ALKPHOS 55 11/18/2011 0858   AST 28 04/24/2013 0953   AST 25 11/18/2011 0858   ALT 17 04/24/2013 0953   ALT 17 11/18/2011 0858   BILITOT 0.45 04/24/2013 0953   BILITOT 0.5 11/18/2011 0858       RADIOGRAPHIC STUDIES:  Ct Chest Wo Contrast  04/02/2013   CLINICAL DATA:  Right upper lobe lung cancer. Prior chemotherapy and radiation therapy. Current chemotherapy.  EXAM: CT CHEST, ABDOMEN AND PELVIS WITHOUT CONTRAST  TECHNIQUE: Multidetector CT imaging of the chest, abdomen and pelvis was performed following the standard protocol without IV contrast.  COMPARISON:  01/16/2013  FINDINGS:   CT CHEST FINDINGS  Right Port-A-Cath tip:  SVC.  Radiation therapy related scarring, right upper lobe medially, with dystrophic calcification. No change in contour or size of region of elevated density to suggest  recurrent tumor in this location. Stable stranding in the mediastinum with scattered small mediastinal lymph nodes, not changed. Coronary artery atherosclerosis. Stable small amount of anterior pericardial fluid. Faint opacity inferiorly in the right middle lobe likely reflects atelectasis but merits observation. There is some mild ground-glass nodularity peripherally in the right middle lobe on image 41 of series 4, including a 0.6 x 0.4 cm ground-glass density nodule.  Left apical scarring noted. Emphysema is present. Calcified granuloma, left lower lobe along the pleural margin, stable.    CT ABDOMEN AND PELVIS FINDINGS  Stable chronic bilateral para renal stranding. The noncontrast CT appearance of the liver, spleen, pancreas, and adrenal glands is within normal limits. Aortoiliac atherosclerotic calcification noted with mildly ectatic abdominal aorta. Indistinctly marginated aortic caval lymph node 1.0 cm in diameter, previously the same, this lymph node was previously hypermetabolic on the PET-CT from 09/19/2012. If additional smaller aortic caval and retroperitoneal lymph nodes are present including a 0.9 cm aortic caval node on image 77 of series 2  Urinary bladder unremarkable.  Slightly thickened wall of the left collecting system, stable the without an obvious focal mass. Stable appearance of small left renal cyst.    IMPRESSION: 1. Compared to the prior exam there is some faint ground-glass nodularity in the right middle lobe which is probably inflammatory but which warrants careful imaging observation to exclude progression/low grade malignancy. 2. Post therapy related findings medially in the right upper lobe, stable. 3. There is mild retroperitoneal adenopathy. On the prior PET-CT of 09/19/2012, this was shown to be hypermetabolic, suspicious for residual malignancy, but is not progressive. 4. Atherosclerosis. 5. Stable prints of chronic prominent perirenal stranding, and slight wall thickening in  the collecting systems, left greater than right, of uncertain significance.   Electronically Signed   By: Sherryl Barters M.D.   On: 04/02/2013 08:37   ASSESSMENT AND PLAN: This is a very pleasant 68 years old white male with metastatic non-small cell lung cancer, adenocarcinoma currently undergoing systemic chemotherapy with Alimta and Avastin status post 4 cycles. The patient tolerated the last cycle of his treatment fairly well. The patient was discussed with and also seen by Dr. [name]. His recent CT scan of the chest, abdomen and pelvis showed no significant evidence for disease progression. He'll receive cycle #5 of his systemic chemotherapy with Alimta and Avastin today as scheduled. To further evaluate and possibly treat his nighttime urinary frequency with a history of prostate enlargement, the patient will be deferred to urology for further evaluation and management. The patient is in agreement with  this plan. He will return in 3 weeks prior to the next scheduled cycle of maintenance chemotherapy with Alimta and Avastin. He was advised to call immediately if he has any concerning symptoms in the interval. The patient voices understanding of current disease status and treatment options and is in agreement with the current care plan.  All questions were answered. The patient knows to call the clinic with any problems, questions or concerns. We can certainly see the patient much sooner if necessary.  Carlton Adam PA-C   ADDENDUM: Hematology/Oncology Attending: I had the face to face encounter with the patient. I recommended his care plan. This is a very pleasant 68 years old white male with metastatic non-small cell lung cancer, adenocarcinoma currently on maintenance treatment with Alimta and Avastin. He is tolerating his treatment fairly well with no significant adverse effects today. He has some issues with benign prostatic hypertrophy but he is very reluctant to see a urologist  because he doesn't feel that other doctors are doing much for him when he sees him. I have a lengthy discussion with the patient and was able to convince him to see a urologist for evaluation of his condition. He'll continue his current treatment with Alimta and Avastin as scheduled. He would come back for follow up visit in 3 weeks with the next cycle of his treatment. He was advised to call immediately if he has any concerning symptoms in the interval.  Disclaimer: This note was dictated with voice recognition software. Similar sounding words can inadvertently be transcribed and may not be corrected upon review. Eilleen Kempf., MD 04/24/2013

## 2013-04-24 NOTE — Telephone Encounter (Signed)
Per staff message and POF I have scheduled appts.  JMW  

## 2013-04-24 NOTE — Telephone Encounter (Signed)
Gave pt appt for lab and MD emailed  Sharyn Lull regarding chemo for February and March 2015

## 2013-04-25 ENCOUNTER — Telehealth: Payer: Self-pay | Admitting: Internal Medicine

## 2013-04-25 NOTE — Telephone Encounter (Signed)
Talked to pt , lab,md and chemo for February and March 2015

## 2013-05-07 ENCOUNTER — Encounter: Payer: Self-pay | Admitting: Adult Health

## 2013-05-07 ENCOUNTER — Ambulatory Visit (INDEPENDENT_AMBULATORY_CARE_PROVIDER_SITE_OTHER)
Admission: RE | Admit: 2013-05-07 | Discharge: 2013-05-07 | Disposition: A | Discharge status: Home / Self Care | Payer: Medicare Other | Source: Ambulatory Visit | Attending: Adult Health | Admitting: Adult Health

## 2013-05-07 ENCOUNTER — Ambulatory Visit (INDEPENDENT_AMBULATORY_CARE_PROVIDER_SITE_OTHER): Payer: Medicare Other | Admitting: Adult Health

## 2013-05-07 VITALS — BP 124/76 | HR 85 | Temp 97.7°F | Ht 68.0 in | Wt 139.0 lb

## 2013-05-07 DIAGNOSIS — R0600 Dyspnea, unspecified: Secondary | ICD-10-CM

## 2013-05-07 DIAGNOSIS — R0609 Other forms of dyspnea: Secondary | ICD-10-CM

## 2013-05-07 DIAGNOSIS — R0989 Other specified symptoms and signs involving the circulatory and respiratory systems: Secondary | ICD-10-CM

## 2013-05-07 DIAGNOSIS — J209 Acute bronchitis, unspecified: Secondary | ICD-10-CM

## 2013-05-07 MED ORDER — HYDROCODONE-HOMATROPINE 5-1.5 MG/5ML PO SYRP: 5.0000 mL | ORAL_SOLUTION | Freq: Four times a day (QID) | ORAL | Status: DC | PRN

## 2013-05-07 MED ORDER — AMOXICILLIN-POT CLAVULANATE 875-125 MG PO TABS: 1.0000 | ORAL_TABLET | Freq: Two times a day (BID) | ORAL | Status: AC

## 2013-05-07 MED ORDER — PREDNISONE 10 MG PO TABS: ORAL_TABLET | ORAL | Status: DC

## 2013-05-07 NOTE — Patient Instructions (Addendum)
Augmentin 875mg  Twice daily  For 7 days -take with food.  Prednisone taper. Over the next week. Mucinex DM twice daily as needed. For cough, congestion, and some fluids and rest. Hydromet 1/2-1 teaspoon every 6-8 hours as needed. For cough, may make you sleepy Return in 2 weeks with pulmonary function test with Dr. Elsworth Soho and as needed. Please contact office for sooner follow up if symptoms do not improve or worsen or seek emergency care  Labs today

## 2013-05-08 DIAGNOSIS — J209 Acute bronchitis, unspecified: Secondary | ICD-10-CM | POA: Insufficient documentation

## 2013-05-08 NOTE — Assessment & Plan Note (Signed)
Dyspnea progressively worsened in setting of underlying lung cancer stable w/ ongoing chemo. Suspect symptoms are multifactoral w/ deconditioning.  No significant desats with ambulation in office .  Will repeat PFT on return if able  Check labs today , check d dimer, if positive consider VQ scan as renal fxn if poor.   Plan  Return in 2 weeks with pulmonary function test with Dr. Elsworth Soho and as needed. Please contact office for sooner follow up if symptoms do not improve or worsen or seek emergency care  Labs today

## 2013-05-08 NOTE — Progress Notes (Signed)
  Subjective:    Patient ID: [name], male    DOB: 05/04/1945, 68 y.o.   MRN: [id number]  HPI  6M, ex smoker with metastatic adenoCA diagnosed in sep '10 s/p chemo & RT to mediastinum last 10/10 CT chest 2/13 showed Multiple new ground-glass nodules in the lower lobes - not seen in 11/12.  These resolved with prednisone ? inflammatory  He is on maintenance chemo alimta & avastin  Spirometry 2012 showed mild obstruction but preserved FEv1 at 97%   07/07/2011  CT chest 3/13 showed resolution of infiltrates   Spirometry >> mild airway obstruction, FEv1 93%    10/12/11  Tapered off pred - 7/13 Cough has unchanged since last visit. breathing has slightly worsened due to heat. denies any wheezing, chest tx . Pt stopped the symbicort/albuterol bc he did not notice a difference Knee injury Dry cough Ct chest/ abd 5/13 stable para mediastinal changes, Borderline enlarged retroperitoneal lymph nodes  Continues on 4 wkly maintenance chemo  No heartburn or sinus drainage, wt remains low >>no changes   05/08/2013 Acute OV  Complains of head congestion w/ clear drainage, PND, prod cough, chest tightness, increased SOB x1.5weeks.   Denies f/c/s, wheezing, hemoptysis, nausea, vomiting.   Has been exposed to paint fumes within the home, doing some repairs in home.   Currently followed by Oncology on Maintenance chemotherapy again with Alimta 500 mg/M2 and Avastin 15 mg/kg every 3 weeks, first cycle 01/23/2013. His recent CT scan (04/02/13)of the chest, abdomen and pelvis showed no significant evidence for disease progression.  He is a former smoker, previous spirometry in 2012 showed only mild aiflow obstruction w/ preserved FEV1. Says that over last year , breathing, DOE seem to be getting worse. Wears out easily with fatigue and dyspnea.  No chest pain, orthopnea, edema , fever, hemoptysis.  Wt is stable w/ no n/v/d.      Review of Systems  Pt denies any significant  nasal congestion or  excess secretions, fever, chills, sweats, unintended wt loss, pleuritic or exertional cp, orthopnea pnd or leg swelling.  Pt also denies any obvious fluctuation in symptoms with weather or environmental change or other alleviating or aggravating factors.    Pt denies any increase in rescue therapy over baseline, denies waking up needing it or having early am exacerbations or coughing/wheezing/ or dyspnea `     Objective:   Physical Exam   Gen. Pleasant, well-nourished, in no distress ENT - no lesions, no post nasal drip Neck: No JVD, no thyromegaly, no carotid bruits Lungs: no use of accessory muscles, no dullness to percussion, lt basal rales , no rhonchi  Cardiovascular: Rhythm regular, heart sounds  normal, no murmurs or gallops, no peripheral edema Musculoskeletal: No deformities, no cyanosis or clubbing         Assessment & Plan:

## 2013-05-08 NOTE — Assessment & Plan Note (Signed)
Augmentin 875mg  Twice daily  For 7 days -take with food.  Prednisone taper. Over the next week. Mucinex DM twice daily as needed. For cough, congestion, and some fluids and rest. Hydromet 1/2-1 teaspoon every 6-8 hours as needed. For cough, may make you sleepy Return in 2 weeks with pulmonary function test with Dr. Elsworth Soho and as needed. Please contact office for sooner follow up if symptoms do not improve or worsen or seek emergency care  Labs today

## 2013-05-09 ENCOUNTER — Telehealth: Payer: Self-pay | Admitting: Internal Medicine

## 2013-05-09 ENCOUNTER — Telehealth: Payer: Self-pay | Admitting: Pulmonary Disease

## 2013-05-09 NOTE — Telephone Encounter (Signed)
Spoke with pt. He reports when he went downstairs to have lab and cxr done both depts were open. After having CXR done he went to lab dept and they would not take him. He reports he was told by a lady they were closed and he would have to come back another day. They would not draw his blood. Pt lives in [place] and he reports he is not going to drive all the way here and then back total of 60 miles for labs done. He is scheduled for PFT on 05/22/13 and then appt with RA on 05/29/13. He wants to know if this is okay to wait until then? Please advise TP thanks

## 2013-05-09 NOTE — Telephone Encounter (Signed)
Error.[name] ° °

## 2013-05-09 NOTE — Telephone Encounter (Signed)
I offered pt to send order to [facility] or [facility] and he reports where he lives in [place] either way he goes it is about 30 miles for him to get there. He has pending appt 05/15/13 w/ cancer center and wants to know if he can just wait. Pt is not happy about this bc it has became a real pain for him. I apologized to pt. Please advise TP thanks

## 2013-05-10 NOTE — Telephone Encounter (Signed)
[email or web address].Reganne Messerschmidt, CMA  

## 2013-05-10 NOTE — Telephone Encounter (Signed)
Spoke with the pt and notified of recs per TP  He verbalized understanding  He states that he will go to the lab [facility] and have them call us

## 2013-05-10 NOTE — Telephone Encounter (Signed)
LMTCB

## 2013-05-10 NOTE — Telephone Encounter (Signed)
Pt returning triage nurse call.[name]

## 2013-05-10 NOTE — Telephone Encounter (Signed)
Unclear why this happened as he went downstairs prior to lab closing.  Glass blower/designer has been notified.  Would be better to have labs done sooner however if not possible can have done on 05/15/13 as he is going to cancer center .please have them faxed to our office  Thanks so much  Sorry for the inconvenience

## 2013-05-10 NOTE — Telephone Encounter (Signed)
Pt is returning call.  [name] ° °

## 2013-05-15 ENCOUNTER — Encounter: Payer: Self-pay | Admitting: Internal Medicine

## 2013-05-15 ENCOUNTER — Ambulatory Visit (HOSPITAL_BASED_OUTPATIENT_CLINIC_OR_DEPARTMENT_OTHER): Payer: Medicare Other | Admitting: Internal Medicine

## 2013-05-15 ENCOUNTER — Ambulatory Visit (HOSPITAL_BASED_OUTPATIENT_CLINIC_OR_DEPARTMENT_OTHER): Payer: Medicare Other

## 2013-05-15 ENCOUNTER — Other Ambulatory Visit (HOSPITAL_BASED_OUTPATIENT_CLINIC_OR_DEPARTMENT_OTHER): Payer: Medicare Other

## 2013-05-15 VITALS — BP 163/82 | HR 66 | Temp 97.8°F | Resp 18 | Ht 68.0 in | Wt 137.6 lb

## 2013-05-15 DIAGNOSIS — R0602 Shortness of breath: Secondary | ICD-10-CM

## 2013-05-15 DIAGNOSIS — C3410 Malignant neoplasm of upper lobe, unspecified bronchus or lung: Secondary | ICD-10-CM

## 2013-05-15 DIAGNOSIS — Z5112 Encounter for antineoplastic immunotherapy: Secondary | ICD-10-CM

## 2013-05-15 DIAGNOSIS — C3491 Malignant neoplasm of unspecified part of right bronchus or lung: Secondary | ICD-10-CM

## 2013-05-15 DIAGNOSIS — I10 Essential (primary) hypertension: Secondary | ICD-10-CM

## 2013-05-15 DIAGNOSIS — Z5111 Encounter for antineoplastic chemotherapy: Secondary | ICD-10-CM

## 2013-05-15 LAB — COMPREHENSIVE METABOLIC PANEL (CC13)
ALBUMIN: 3.5 g/dL (ref 3.5–5.0)
ALT: 24 U/L (ref 0–55)
ANION GAP: 7 meq/L (ref 3–11)
AST: 28 U/L (ref 5–34)
Alkaline Phosphatase: 84 U/L (ref 40–150)
BUN: 27.4 mg/dL — ABNORMAL HIGH (ref 7.0–26.0)
CALCIUM: 9.6 mg/dL (ref 8.4–10.4)
CHLORIDE: 113 meq/L — AB (ref 98–109)
CO2: 20 meq/L — AB (ref 22–29)
Creatinine: 1.5 mg/dL — ABNORMAL HIGH (ref 0.7–1.3)
GLUCOSE: 82 mg/dL (ref 70–140)
POTASSIUM: 4.2 meq/L (ref 3.5–5.1)
Sodium: 141 mEq/L (ref 136–145)
Total Bilirubin: 0.41 mg/dL (ref 0.20–1.20)
Total Protein: 5.9 g/dL — ABNORMAL LOW (ref 6.4–8.3)

## 2013-05-15 LAB — CBC WITH DIFFERENTIAL/PLATELET
BASO%: 0.1 % (ref 0.0–2.0)
BASOS ABS: 0 10*3/uL (ref 0.0–0.1)
EOS%: 1.6 % (ref 0.0–7.0)
Eosinophils Absolute: 0.1 10*3/uL (ref 0.0–0.5)
HEMATOCRIT: 25.7 % — AB (ref 38.4–49.9)
HGB: 8.5 g/dL — ABNORMAL LOW (ref 13.0–17.1)
LYMPH#: 1.1 10*3/uL (ref 0.9–3.3)
LYMPH%: 14.8 % (ref 14.0–49.0)
MCH: 37.4 pg — ABNORMAL HIGH (ref 27.2–33.4)
MCHC: 33.1 g/dL (ref 32.0–36.0)
MCV: 113.2 fL — ABNORMAL HIGH (ref 79.3–98.0)
MONO#: 1.3 10*3/uL — AB (ref 0.1–0.9)
MONO%: 17.3 % — ABNORMAL HIGH (ref 0.0–14.0)
NEUT#: 4.9 10*3/uL (ref 1.5–6.5)
NEUT%: 66.2 % (ref 39.0–75.0)
PLATELETS: 143 10*3/uL (ref 140–400)
RBC: 2.27 10*6/uL — ABNORMAL LOW (ref 4.20–5.82)
RDW: 19.3 % — ABNORMAL HIGH (ref 11.0–14.6)
WBC: 7.5 10*3/uL (ref 4.0–10.3)

## 2013-05-15 LAB — UA PROTEIN, DIPSTICK - CHCC: Protein, ur: 300 mg/dL

## 2013-05-15 MED ORDER — HEPARIN SOD (PORK) LOCK FLUSH 100 UNIT/ML IV SOLN
500.0000 [IU] | Freq: Once | INTRAVENOUS | Status: AC | PRN
  Administered 2013-05-15: 500 [IU]
  Filled 2013-05-15: qty 5

## 2013-05-15 MED ORDER — SODIUM CHLORIDE 0.9 % IJ SOLN
10.0000 mL | INTRAMUSCULAR | Status: DC | PRN
  Administered 2013-05-15: 10 mL
  Filled 2013-05-15: qty 10

## 2013-05-15 MED ORDER — ONDANSETRON 8 MG/50ML IVPB (CHCC)
8.0000 mg | Freq: Once | INTRAVENOUS | Status: AC
  Administered 2013-05-15: 8 mg via INTRAVENOUS

## 2013-05-15 MED ORDER — ONDANSETRON 8 MG/NS 50 ML IVPB
INTRAVENOUS | Status: AC
  Filled 2013-05-15: qty 8

## 2013-05-15 MED ORDER — SODIUM CHLORIDE 0.9 % IV SOLN
15.0000 mg/kg | Freq: Once | INTRAVENOUS | Status: AC
  Administered 2013-05-15: 975 mg via INTRAVENOUS
  Filled 2013-05-15: qty 39

## 2013-05-15 MED ORDER — SODIUM CHLORIDE 0.9 % IV SOLN
500.0000 mg/m2 | Freq: Once | INTRAVENOUS | Status: AC
  Administered 2013-05-15: 850 mg via INTRAVENOUS
  Filled 2013-05-15: qty 34

## 2013-05-15 MED ORDER — DEXAMETHASONE SODIUM PHOSPHATE 10 MG/ML IJ SOLN
10.0000 mg | Freq: Once | INTRAMUSCULAR | Status: AC
  Administered 2013-05-15: 10 mg via INTRAVENOUS

## 2013-05-15 MED ORDER — SODIUM CHLORIDE 0.9 % IV SOLN
Freq: Once | INTRAVENOUS | Status: AC
  Administered 2013-05-15: 10:00:00 via INTRAVENOUS

## 2013-05-15 MED ORDER — DEXAMETHASONE SODIUM PHOSPHATE 10 MG/ML IJ SOLN
INTRAMUSCULAR | Status: AC
  Filled 2013-05-15: qty 1

## 2013-05-15 NOTE — Patient Instructions (Signed)
[name] Discharge Instructions for Patients Receiving Chemotherapy  Today you received the following chemotherapy agents: Alimta, Avastin   To help prevent nausea and vomiting after your treatment, we encourage you to take your nausea medication as prescribed.    If you develop nausea and vomiting that is not controlled by your nausea medication, call the clinic.   BELOW ARE SYMPTOMS THAT SHOULD BE REPORTED IMMEDIATELY:  *FEVER GREATER THAN 100.5 F  *CHILLS WITH OR WITHOUT FEVER  NAUSEA AND VOMITING THAT IS NOT CONTROLLED WITH YOUR NAUSEA MEDICATION  *UNUSUAL SHORTNESS OF BREATH  *UNUSUAL BRUISING OR BLEEDING  TENDERNESS IN MOUTH AND THROAT WITH OR WITHOUT PRESENCE OF ULCERS  *URINARY PROBLEMS  *BOWEL PROBLEMS  UNUSUAL RASH Items with * indicate a potential emergency and should be followed up as soon as possible.  Feel free to call the clinic you have any questions or concerns. The clinic phone number is [phone].

## 2013-05-15 NOTE — Progress Notes (Signed)
Per Dr. [name], okay to treat today with Hgb 8.5 and urine protein 300. Cindi Carbon, RN

## 2013-05-15 NOTE — Progress Notes (Signed)
[place] Telephone:([phone]) [phone]   Fax:(336) [place], [place] [address] [place] [address]  DIAGNOSIS AND DIAGNOSIS: Metastatic non-small cell lung cancer adenocarcinoma diagnosed in September 2010   PRIOR THERAPY:  #1 status post 6 cycles of systemic chemotherapy with carboplatin, Alimta and Avastin given every 3 weeks last dose given 04/23/2009 with disease stabilization.  #2 status post palliative radiotherapy to the mediastinum under the care of Dr. Pablo Ledger. The patient received a total dose of 3000 cGY and 12 fractions completed 01/21/2009.  #3 Maintenance chemotherapy with Alimta at 500 mg per meter squared and Avastin at 15 mg per kilogram given every 3 weeks status post 35 cycles.  #4 Maintenance chemotherapy with Alimta 500 mg/M2 and Avastin 15 mg/kg every 4 weeks,status post 17 cycles, discontinued secondary to disease progression.  #5 Systemic chemotherapy with carboplatin for AUC of 5, Alimta 500 mg/M2 and Avastin 15 mg/kg every 3 weeks, status post 3 cycles, last cycle was given 12/28/2012. Carboplatin was discontinued starting cycle #2 secondary to hypersensitivity reaction.   CURRENT THERAPY: Maintenance chemotherapy again with Alimta 500 mg/M2 and Avastin 15 mg/kg every 3 weeks, first cycle 01/23/2013. Status post 5 cycles.  CHEMOTHERAPY INTENT: palliative/maintenance.  CURRENT # OF CHEMOTHERAPY CYCLES: 6 CURRENT ANTIEMETICS: Compazine when necessary but clearly used.  CURRENT SMOKING STATUS: current nonsmoker  ORAL CHEMOTHERAPY AND CONSENT: None.  CURRENT BISPHOSPHONATES USE: None.  PAIN MANAGEMENT: No Pain  NARCOTICS INDUCED CONSTIPATION: N/A  LIVING WILL AND CODE STATUS: NCB   INTERVAL HISTORY:  [name] 68 y.o. male returns to the clinic today for follow up visit accompanied by his wife. He has no significant complaints today and tolerating his treatment fairly well. He denied  having any significant chest pain but continues to have shortness breath with exertion with mild cough or hemoptysis. He was seen recently at Dr. Bari Mantis office and was started on treatment with prednisone taper in addition to Augmentin and Mucinex DM. He felt a little bit better few days after starting the treatment He denied having any significant fever or chills. He has no nausea or vomiting. He is here today to start cycle #6 of his chemotherapy.  MEDICAL HISTORY: Past Medical History  Diagnosis Date  . Hypertension   . Arthritis RIGHT SHOULDER  . Immature cataract BILATERAL  . Non-small cell lung cancer DX SEPT 2010  W/ CHEMORADIATION AT THAT TIME -- NOW  W/ METS--  CURRENTLY ON MAINTENANCE CHEMO TX  EVERY 30 DAYS    ONCOLOGIST- DR [facility]  . Acute meniscal tear of knee RIGHT KNEE  . BPH (benign prostatic hypertrophy)     ALLERGIES:  is allergic to carboplatin; percocet; and zolpidem tartrate.  MEDICATIONS:  Current Outpatient Prescriptions  Medication Sig Dispense Refill  . amLODipine (NORVASC) 2.5 MG tablet Take 2.5 mg by mouth every morning.       . Ascorbic Acid (VITAMIN C) 1000 MG tablet Take 1,000 mg by mouth daily.      Marland Kitchen atenolol (TENORMIN) 100 MG tablet Take 100 mg by mouth every morning.       . B Complex-Biotin-FA (B-COMPLEX PO) Take 1 tablet by mouth daily. 1500mg  daily      . cetirizine (ZYRTEC) 10 MG tablet Take 10 mg by mouth daily.      Marland Kitchen doxazosin (CARDURA) 2 MG tablet Take 2 mg by mouth at bedtime.       . FeFum-FePoly-FA-B Cmp-C-Biot (  FOLIVANE-PLUS) CAPS Take 1 capsule by mouth daily.  90 capsule  3  . folic acid (FOLVITE) 1 MG tablet Take 1 tablet (1 mg total) by mouth daily.  90 tablet  1  . HYDROcodone-homatropine (HYDROMET) 5-1.5 MG/5ML syrup Take 5 mLs by mouth every 6 (six) hours as needed for cough.  240 mL  0  . Multiple Vitamin (MULTIVITAMIN) tablet Take 1 tablet by mouth daily.       Marland Kitchen omeprazole (PRILOSEC) 40 MG capsule Take 1 capsule (40 mg total) by  mouth every evening.  90 capsule  3  . prochlorperazine (COMPAZINE) 10 MG tablet Take 1 tablet (10 mg total) by mouth every 6 (six) hours as needed.  30 tablet  0  . Sennosides-Docusate Sodium (SENOKOT S PO) Take 2 tablets by mouth daily. 2 tabs daily      . tamsulosin (FLOMAX) 0.4 MG CAPS capsule Take 1 capsule by mouth daily.       No current facility-administered medications for this visit.    SURGICAL HISTORY:  Past Surgical History  Procedure Laterality Date  . 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 Rotator cuff repair  2008    LEFT SHOULDER  . Bilateral ear drum surgery  1960'S  . Orif left ring finger  and revascularization of radial side  02-16-2007    DEGLOVING INJURY  . Knee arthroscopy  12/21/2011    Procedure: ARTHROSCOPY KNEE;  Surgeon: Tobi Bastos, MD;  Location: [facility];  Service: Orthopedics;  Laterality: Right;  WITH MEDIAL MENISECTOMY    REVIEW OF SYSTEMS:  Constitutional: positive for fatigue Eyes: negative Ears, nose, mouth, throat, and face: negative Respiratory: negative Cardiovascular: negative Gastrointestinal: negative Genitourinary:negative Integument/breast: negative Hematologic/lymphatic: negative Musculoskeletal:negative Neurological: negative Behavioral/Psych: negative Endocrine: negative Allergic/Immunologic: negative   PHYSICAL EXAMINATION: General appearance: alert, cooperative, fatigued and no distress Head: Normocephalic, without obvious abnormality, atraumatic Neck: no adenopathy, no JVD, supple, symmetrical, trachea midline and thyroid not enlarged, symmetric, no tenderness/mass/nodules Lymph nodes: Cervical, supraclavicular, and axillary nodes normal. Resp: clear to auscultation bilaterally Back: symmetric, no curvature. ROM normal. No CVA tenderness. Cardio: regular rate and rhythm, S1, S2 normal, no murmur, click, rub or gallop GI: soft,  non-tender; bowel sounds normal; no masses,  no organomegaly Extremities: extremities normal, atraumatic, no cyanosis or edema Neurologic: Alert and oriented X 3, normal strength and tone. Normal symmetric reflexes. Normal coordination and gait  ECOG PERFORMANCE STATUS: 1 - Symptomatic but completely ambulatory  Blood pressure 163/82, pulse 66, temperature 97.8 F (36.6 C), temperature source Oral, resp. rate 18, height 5\' 8"  (1.727 m), weight 137 lb 9.6 oz (62.415 kg).  LABORATORY DATA: Lab Results  Component Value Date   WBC 7.5 05/15/2013   HGB 8.5* 05/15/2013   HCT 25.7* 05/15/2013   MCV 113.2* 05/15/2013   PLT 143 05/15/2013      Chemistry      Component Value Date/Time   NA 140 04/24/2013 0953   NA 140 12/21/2011 1252   K 4.3 04/24/2013 0953   K 3.3* 12/21/2011 1252   CL 109* 08/29/2012 0856   CL 101 11/18/2011 0858   CO2 22 04/24/2013 0953   CO2 22 11/18/2011 0858   BUN 22.7 04/24/2013 0953   BUN 19 11/18/2011 0858   CREATININE 1.8* 04/24/2013 0953   CREATININE 1.34 11/18/2011 0858      Component Value Date/Time   CALCIUM 9.5 04/24/2013 0953  CALCIUM 9.5 11/18/2011 0858   ALKPHOS 91 04/24/2013 0953   ALKPHOS 55 11/18/2011 0858   AST 28 04/24/2013 0953   AST 25 11/18/2011 0858   ALT 17 04/24/2013 0953   ALT 17 11/18/2011 0858   BILITOT 0.45 04/24/2013 0953   BILITOT 0.5 11/18/2011 0858       RADIOGRAPHIC STUDIES:   ASSESSMENT AND PLAN: This is a very pleasant 68 years old white male with metastatic non-small cell lung cancer, adenocarcinoma currently undergoing systemic chemotherapy with Alimta and Avastin status post 5 cycles. The patient tolerated the last cycle of his treatment fairly well.  I recommended for him to continue his current treatment with maintenance Alimta and Avastin. The patient will start cycle #6 today. He would come back for followup visit in 3 weeks with repeat CT scan of the chest, abdomen and pelvis for restaging of his disease. He was advised to call  immediately if he has any concerning symptoms in the interval. The patient voices understanding of current disease status and treatment options and is in agreement with the current care plan.  All questions were answered. The patient knows to call the clinic with any problems, questions or concerns. We can certainly see the patient much sooner if necessary.  Disclaimer: This note was dictated with voice recognition software. Similar sounding words can inadvertently be transcribed and may not be corrected upon review.

## 2013-05-15 NOTE — Patient Instructions (Signed)
Followup visit in 3 weeks with repeat CT scan of the chest, abdomen and pelvis.

## 2013-05-16 ENCOUNTER — Ambulatory Visit: Payer: Medicare Other | Admitting: Internal Medicine

## 2013-05-17 ENCOUNTER — Other Ambulatory Visit (INDEPENDENT_AMBULATORY_CARE_PROVIDER_SITE_OTHER): Payer: Medicare Other

## 2013-05-17 DIAGNOSIS — R0609 Other forms of dyspnea: Secondary | ICD-10-CM

## 2013-05-17 DIAGNOSIS — R0989 Other specified symptoms and signs involving the circulatory and respiratory systems: Secondary | ICD-10-CM

## 2013-05-17 DIAGNOSIS — R0600 Dyspnea, unspecified: Secondary | ICD-10-CM

## 2013-05-17 LAB — BASIC METABOLIC PANEL
BUN: 31 mg/dL — ABNORMAL HIGH (ref 6–23)
CALCIUM: 9.5 mg/dL (ref 8.4–10.5)
CO2: 22 mEq/L (ref 19–32)
Chloride: 109 mEq/L (ref 96–112)
Creatinine, Ser: 1.7 mg/dL — ABNORMAL HIGH (ref 0.4–1.5)
GFR: 43.98 mL/min — AB (ref >60.00)
Glucose, Bld: 113 mg/dL — ABNORMAL HIGH (ref 70–99)
Potassium: 4.4 mEq/L (ref 3.5–5.1)
SODIUM: 137 meq/L (ref 135–145)

## 2013-05-17 LAB — BRAIN NATRIURETIC PEPTIDE: Pro B Natriuretic peptide (BNP): 222 pg/mL — ABNORMAL HIGH (ref 0.0–100.0)

## 2013-05-17 LAB — TSH: TSH: 3.51 u[IU]/mL (ref 0.35–5.50)

## 2013-05-18 ENCOUNTER — Encounter: Payer: Self-pay | Admitting: *Deleted

## 2013-05-18 ENCOUNTER — Encounter: Payer: Self-pay | Admitting: Adult Health

## 2013-05-18 LAB — D-DIMER, QUANTITATIVE: D-Dimer, Quant: 1.25 ug/mL-FEU — ABNORMAL HIGH (ref 0.00–0.48)

## 2013-05-18 NOTE — Progress Notes (Signed)
Quick Note:  Called, spoke with pt. Informed him of results and recs per TP. He verbalized understanding of this. Given the time of the day, the VQ scan will not be able to get scheduled until Monday. I have advised pt to go to the ED if breathing worsens. He verbalized understanding of this. Will hold msg in Jessica's box to ensure VQ scan gets scheduled on Monday. ______

## 2013-05-18 NOTE — Progress Notes (Signed)
Quick Note:  Pt never returned call. Mychart message sent to pt informing pt of results. ______

## 2013-05-21 ENCOUNTER — Other Ambulatory Visit: Payer: Self-pay | Admitting: Internal Medicine

## 2013-05-21 ENCOUNTER — Encounter (HOSPITAL_COMMUNITY)
Admission: RE | Admit: 2013-05-21 | Discharge: 2013-05-21 | Disposition: A | Discharge status: Home / Self Care | Payer: Medicare Other | Source: Ambulatory Visit | Attending: Adult Health | Admitting: Adult Health

## 2013-05-21 ENCOUNTER — Other Ambulatory Visit: Payer: Self-pay | Admitting: Adult Health

## 2013-05-21 ENCOUNTER — Ambulatory Visit (HOSPITAL_COMMUNITY)
Admission: RE | Admit: 2013-05-21 | Discharge: 2013-05-21 | Disposition: A | Discharge status: Home / Self Care | Payer: Medicare Other | Source: Ambulatory Visit | Attending: Adult Health | Admitting: Adult Health

## 2013-05-21 DIAGNOSIS — R0600 Dyspnea, unspecified: Secondary | ICD-10-CM

## 2013-05-21 DIAGNOSIS — R0602 Shortness of breath: Secondary | ICD-10-CM | POA: Insufficient documentation

## 2013-05-21 DIAGNOSIS — Z85118 Personal history of other malignant neoplasm of bronchus and lung: Secondary | ICD-10-CM | POA: Insufficient documentation

## 2013-05-21 DIAGNOSIS — R0989 Other specified symptoms and signs involving the circulatory and respiratory systems: Principal | ICD-10-CM

## 2013-05-21 DIAGNOSIS — R0609 Other forms of dyspnea: Secondary | ICD-10-CM

## 2013-05-21 MED ORDER — TECHNETIUM TC 99M DIETHYLENETRIAME-PENTAACETIC ACID: 40.0000 | Freq: Once | INTRAVENOUS | Status: DC | PRN

## 2013-05-21 MED ORDER — TECHNETIUM TO 99M ALBUMIN AGGREGATED
6.0000 | Freq: Once | INTRAVENOUS | Status: AC | PRN
  Administered 2013-05-21: 6 via INTRAVENOUS

## 2013-05-21 NOTE — Progress Notes (Signed)
Quick Note:  Orders only encounter created for STAT VQ scan. ______

## 2013-05-21 NOTE — Progress Notes (Signed)
Result Notes    Notes Recorded by Jonelle Sports, RN on 05/18/2013 at 5:38 PM Called, spoke with pt. Informed him of results and recs per TP. He verbalized understanding of this. Given the time of the day, the VQ scan will not be able to get scheduled until Monday. I have advised pt to go to the ED if breathing worsens. He verbalized understanding of this. Will hold msg in [place] box to ensure VQ scan gets scheduled on Monday. ------  Notes Recorded by Melvenia Needles, NP on 05/18/2013 at 2:45 PM D. Dimer is elevated.  Would set up for VQ scan -Dyspnea, and elevated d .dimer.  Has ov with Dr. Elsworth Soho In 2 weeks -keep this ov to review test.  Please contact office for sooner follow up if symptoms do not improve or worsen or seek emergency care     Orders only encounter created for STAT VQ scan order.

## 2013-05-21 NOTE — Progress Notes (Signed)
Per [facility], in order for VQ scan to be scheduled pt must have cxr order placed and be done prior to test. Cxr ordered at [facility].

## 2013-05-22 ENCOUNTER — Encounter: Payer: Self-pay | Admitting: Adult Health

## 2013-05-23 ENCOUNTER — Emergency Department (HOSPITAL_COMMUNITY): Payer: Medicare Other

## 2013-05-23 ENCOUNTER — Other Ambulatory Visit: Payer: Self-pay

## 2013-05-23 ENCOUNTER — Observation Stay (HOSPITAL_COMMUNITY)
Admission: EM | Admit: 2013-05-23 | Discharge: 2013-05-25 | Disposition: A | Discharge status: Home / Self Care | Payer: Medicare Other | Attending: Internal Medicine | Admitting: Internal Medicine

## 2013-05-23 DIAGNOSIS — D61810 Antineoplastic chemotherapy induced pancytopenia: Secondary | ICD-10-CM | POA: Insufficient documentation

## 2013-05-23 DIAGNOSIS — N400 Enlarged prostate without lower urinary tract symptoms: Secondary | ICD-10-CM | POA: Insufficient documentation

## 2013-05-23 DIAGNOSIS — C3492 Malignant neoplasm of unspecified part of left bronchus or lung: Secondary | ICD-10-CM | POA: Diagnosis present

## 2013-05-23 DIAGNOSIS — T451X5A Adverse effect of antineoplastic and immunosuppressive drugs, initial encounter: Secondary | ICD-10-CM | POA: Insufficient documentation

## 2013-05-23 DIAGNOSIS — R0609 Other forms of dyspnea: Principal | ICD-10-CM | POA: Insufficient documentation

## 2013-05-23 DIAGNOSIS — C3410 Malignant neoplasm of upper lobe, unspecified bronchus or lung: Secondary | ICD-10-CM | POA: Insufficient documentation

## 2013-05-23 DIAGNOSIS — Z923 Personal history of irradiation: Secondary | ICD-10-CM | POA: Insufficient documentation

## 2013-05-23 DIAGNOSIS — R0602 Shortness of breath: Secondary | ICD-10-CM

## 2013-05-23 DIAGNOSIS — R0989 Other specified symptoms and signs involving the circulatory and respiratory systems: Principal | ICD-10-CM | POA: Insufficient documentation

## 2013-05-23 DIAGNOSIS — S68118A Complete traumatic metacarpophalangeal amputation of other finger, initial encounter: Secondary | ICD-10-CM | POA: Insufficient documentation

## 2013-05-23 DIAGNOSIS — I10 Essential (primary) hypertension: Secondary | ICD-10-CM | POA: Diagnosis present

## 2013-05-23 DIAGNOSIS — N189 Chronic kidney disease, unspecified: Secondary | ICD-10-CM | POA: Insufficient documentation

## 2013-05-23 DIAGNOSIS — D61818 Other pancytopenia: Secondary | ICD-10-CM

## 2013-05-23 DIAGNOSIS — Z87891 Personal history of nicotine dependence: Secondary | ICD-10-CM | POA: Insufficient documentation

## 2013-05-23 DIAGNOSIS — J209 Acute bronchitis, unspecified: Secondary | ICD-10-CM

## 2013-05-23 DIAGNOSIS — N179 Acute kidney failure, unspecified: Secondary | ICD-10-CM | POA: Insufficient documentation

## 2013-05-23 DIAGNOSIS — R040 Epistaxis: Secondary | ICD-10-CM | POA: Insufficient documentation

## 2013-05-23 DIAGNOSIS — Z79899 Other long term (current) drug therapy: Secondary | ICD-10-CM | POA: Insufficient documentation

## 2013-05-23 DIAGNOSIS — D649 Anemia, unspecified: Secondary | ICD-10-CM | POA: Insufficient documentation

## 2013-05-23 DIAGNOSIS — I129 Hypertensive chronic kidney disease with stage 1 through stage 4 chronic kidney disease, or unspecified chronic kidney disease: Secondary | ICD-10-CM | POA: Insufficient documentation

## 2013-05-23 LAB — CBC
HEMATOCRIT: 22.9 % — AB (ref 39.0–52.0)
Hemoglobin: 7.9 g/dL — ABNORMAL LOW (ref 13.0–17.0)
MCH: 37.4 pg — ABNORMAL HIGH (ref 26.0–34.0)
MCHC: 34.5 g/dL (ref 30.0–36.0)
MCV: 108.5 fL — AB (ref 78.0–100.0)
Platelets: 59 10*3/uL — ABNORMAL LOW (ref 150–400)
RBC: 2.11 MIL/uL — AB (ref 4.22–5.81)
RDW: 16.5 % — ABNORMAL HIGH (ref 11.5–15.5)
WBC: 2.3 10*3/uL — ABNORMAL LOW (ref 4.0–10.5)

## 2013-05-23 LAB — I-STAT TROPONIN, ED: Troponin i, poc: 0 ng/mL (ref 0.00–0.08)

## 2013-05-23 LAB — BASIC METABOLIC PANEL
BUN: 44 mg/dL — ABNORMAL HIGH (ref 6–23)
CHLORIDE: 101 meq/L (ref 96–112)
CO2: 20 meq/L (ref 19–32)
Calcium: 9.4 mg/dL (ref 8.4–10.5)
Creatinine, Ser: 2.49 mg/dL — ABNORMAL HIGH (ref 0.50–1.35)
GFR calc Af Amer: 29 mL/min — ABNORMAL LOW (ref >90)
GFR calc non Af Amer: 25 mL/min — ABNORMAL LOW (ref >90)
Glucose, Bld: 111 mg/dL — ABNORMAL HIGH (ref 70–99)
Potassium: 4.6 mEq/L (ref 3.7–5.3)
SODIUM: 135 meq/L — AB (ref 137–147)

## 2013-05-23 NOTE — ED Notes (Signed)
Pt c/o SOB x 4 years.  Pt unable to finish sentences.  Seems very out of breath.  States he has been having doctor's appts trying to figure out what is wrong with him.  No CP.

## 2013-05-23 NOTE — ED Notes (Signed)
Patient transported to X-ray 

## 2013-05-24 ENCOUNTER — Ambulatory Visit: Payer: Medicare Other | Admitting: Adult Health

## 2013-05-24 ENCOUNTER — Encounter (HOSPITAL_COMMUNITY): Payer: Self-pay | Admitting: Emergency Medicine

## 2013-05-24 DIAGNOSIS — N179 Acute kidney failure, unspecified: Secondary | ICD-10-CM | POA: Diagnosis present

## 2013-05-24 DIAGNOSIS — I10 Essential (primary) hypertension: Secondary | ICD-10-CM | POA: Diagnosis present

## 2013-05-24 DIAGNOSIS — D61818 Other pancytopenia: Secondary | ICD-10-CM | POA: Diagnosis present

## 2013-05-24 DIAGNOSIS — N189 Chronic kidney disease, unspecified: Secondary | ICD-10-CM

## 2013-05-24 DIAGNOSIS — R0609 Other forms of dyspnea: Secondary | ICD-10-CM

## 2013-05-24 LAB — CBC
HCT: 21.5 % — ABNORMAL LOW (ref 39.0–52.0)
Hemoglobin: 7.5 g/dL — ABNORMAL LOW (ref 13.0–17.0)
MCH: 35.7 pg — ABNORMAL HIGH (ref 26.0–34.0)
MCHC: 34.9 g/dL (ref 30.0–36.0)
MCV: 102.4 fL — ABNORMAL HIGH (ref 78.0–100.0)
PLATELETS: 36 10*3/uL — AB (ref 150–400)
RBC: 2.1 MIL/uL — AB (ref 4.22–5.81)
RDW: 20 % — ABNORMAL HIGH (ref 11.5–15.5)
WBC: 1.8 10*3/uL — AB (ref 4.0–10.5)

## 2013-05-24 LAB — URINALYSIS, ROUTINE W REFLEX MICROSCOPIC
BILIRUBIN URINE: NEGATIVE
Glucose, UA: NEGATIVE mg/dL
Hgb urine dipstick: NEGATIVE
KETONES UR: NEGATIVE mg/dL
Leukocytes, UA: NEGATIVE
NITRITE: NEGATIVE
PROTEIN: 100 mg/dL — AB
Specific Gravity, Urine: 1.009 (ref 1.005–1.030)
UROBILINOGEN UA: 0.2 mg/dL (ref 0.0–1.0)
pH: 5.5 (ref 5.0–8.0)

## 2013-05-24 LAB — BLOOD GAS, ARTERIAL
Acid-base deficit: 5 mmol/L — ABNORMAL HIGH (ref 0.0–2.0)
Bicarbonate: 17.8 mEq/L — ABNORMAL LOW (ref 20.0–24.0)
DRAWN BY: 232811
FIO2: 0.21 %
O2 SAT: 96.9 %
PCO2 ART: 27.3 mmHg — AB (ref 35.0–45.0)
PO2 ART: 88.2 mmHg (ref 80.0–100.0)
Patient temperature: 98.6
TCO2: 16.3 mmol/L (ref 0–100)
pH, Arterial: 7.43 (ref 7.350–7.450)

## 2013-05-24 LAB — BASIC METABOLIC PANEL
BUN: 42 mg/dL — ABNORMAL HIGH (ref 6–23)
CALCIUM: 8.6 mg/dL (ref 8.4–10.5)
CHLORIDE: 105 meq/L (ref 96–112)
CO2: 19 mEq/L (ref 19–32)
Creatinine, Ser: 2.19 mg/dL — ABNORMAL HIGH (ref 0.50–1.35)
GFR calc Af Amer: 34 mL/min — ABNORMAL LOW (ref >90)
GFR calc non Af Amer: 29 mL/min — ABNORMAL LOW (ref >90)
Glucose, Bld: 105 mg/dL — ABNORMAL HIGH (ref 70–99)
Potassium: 4.4 mEq/L (ref 3.7–5.3)
SODIUM: 137 meq/L (ref 137–147)

## 2013-05-24 LAB — PREPARE RBC (CROSSMATCH)

## 2013-05-24 LAB — LACTIC ACID, PLASMA: Lactic Acid, Venous: 0.6 mmol/L (ref 0.5–2.2)

## 2013-05-24 LAB — PRO B NATRIURETIC PEPTIDE: PRO B NATRI PEPTIDE: 841.8 pg/mL — AB (ref 0–125)

## 2013-05-24 LAB — ABO/RH: ABO/RH(D): O POS

## 2013-05-24 LAB — URINE MICROSCOPIC-ADD ON

## 2013-05-24 LAB — TROPONIN I

## 2013-05-24 LAB — SALICYLATE LEVEL: Salicylate Lvl: <2 mg/dL — ABNORMAL LOW (ref 2.8–20.0)

## 2013-05-24 MED ORDER — ALBUTEROL SULFATE (2.5 MG/3ML) 0.083% IN NEBU
5.0000 mg | INHALATION_SOLUTION | Freq: Once | RESPIRATORY_TRACT | Status: AC
  Administered 2013-05-24: 5 mg via RESPIRATORY_TRACT
  Filled 2013-05-24: qty 6

## 2013-05-24 MED ORDER — SODIUM CHLORIDE 0.9 % IJ SOLN: 3.0000 mL | INTRAMUSCULAR | Status: DC | PRN

## 2013-05-24 MED ORDER — SODIUM CHLORIDE 0.9 % IV BOLUS (SEPSIS)
500.0000 mL | Freq: Once | INTRAVENOUS | Status: AC
  Administered 2013-05-24: 500 mL via INTRAVENOUS

## 2013-05-24 MED ORDER — SODIUM CHLORIDE 0.9 % IV SOLN: 250.0000 mL | INTRAVENOUS | Status: DC | PRN

## 2013-05-24 MED ORDER — HYDROCODONE-HOMATROPINE 5-1.5 MG/5ML PO SYRP
5.0000 mL | ORAL_SOLUTION | Freq: Four times a day (QID) | ORAL | Status: DC | PRN
  Administered 2013-05-24: 5 mL via ORAL
  Filled 2013-05-24: qty 5

## 2013-05-24 MED ORDER — DOXAZOSIN MESYLATE 2 MG PO TABS
2.0000 mg | ORAL_TABLET | Freq: Every day | ORAL | Status: DC
Start: 2013-05-24 — End: 2013-05-25
  Administered 2013-05-24: 2 mg via ORAL
  Filled 2013-05-24 (×3): qty 1

## 2013-05-24 MED ORDER — DEXTROSE 5 % IV SOLN
1.0000 g | Freq: Once | INTRAVENOUS | Status: AC
  Administered 2013-05-24: 1 g via INTRAVENOUS
  Filled 2013-05-24: qty 10

## 2013-05-24 MED ORDER — IPRATROPIUM BROMIDE 0.02 % IN SOLN
0.5000 mg | Freq: Once | RESPIRATORY_TRACT | Status: AC
  Administered 2013-05-24: 0.5 mg via RESPIRATORY_TRACT
  Filled 2013-05-24: qty 2.5

## 2013-05-24 MED ORDER — SODIUM CHLORIDE 0.9 % IJ SOLN
10.0000 mL | Freq: Two times a day (BID) | INTRAMUSCULAR | Status: DC
  Administered 2013-05-24: 10 mL

## 2013-05-24 MED ORDER — SODIUM CHLORIDE 0.9 % IJ SOLN
3.0000 mL | Freq: Two times a day (BID) | INTRAMUSCULAR | Status: DC
  Administered 2013-05-24: 3 mL via INTRAVENOUS

## 2013-05-24 MED ORDER — PROCHLORPERAZINE MALEATE 10 MG PO TABS
10.0000 mg | ORAL_TABLET | Freq: Four times a day (QID) | ORAL | Status: DC | PRN
  Filled 2013-05-24: qty 1

## 2013-05-24 MED ORDER — SODIUM CHLORIDE 0.9 % IV SOLN
INTRAVENOUS | Status: DC
  Administered 2013-05-24 – 2013-05-25 (×3): via INTRAVENOUS

## 2013-05-24 MED ORDER — TAMSULOSIN HCL 0.4 MG PO CAPS
0.4000 mg | ORAL_CAPSULE | Freq: Every day | ORAL | Status: DC
  Administered 2013-05-24 – 2013-05-25 (×2): 0.4 mg via ORAL
  Filled 2013-05-24 (×2): qty 1

## 2013-05-24 MED ORDER — ATENOLOL 100 MG PO TABS
100.0000 mg | ORAL_TABLET | Freq: Every morning | ORAL | Status: DC
  Administered 2013-05-24 – 2013-05-25 (×2): 100 mg via ORAL
  Filled 2013-05-24 (×2): qty 1

## 2013-05-24 MED ORDER — AMLODIPINE BESYLATE 2.5 MG PO TABS
2.5000 mg | ORAL_TABLET | Freq: Every morning | ORAL | Status: DC
  Administered 2013-05-24 – 2013-05-25 (×2): 2.5 mg via ORAL
  Filled 2013-05-24 (×2): qty 1

## 2013-05-24 MED ORDER — SODIUM CHLORIDE 0.9 % IJ SOLN
10.0000 mL | INTRAMUSCULAR | Status: DC | PRN
  Administered 2013-05-24 – 2013-05-25 (×3): 10 mL

## 2013-05-24 MED ORDER — SODIUM CHLORIDE 0.9 % IJ SOLN: 3.0000 mL | Freq: Two times a day (BID) | INTRAMUSCULAR | Status: DC

## 2013-05-24 MED ORDER — PANTOPRAZOLE SODIUM 40 MG PO TBEC
40.0000 mg | DELAYED_RELEASE_TABLET | Freq: Every day | ORAL | Status: DC
  Administered 2013-05-24 – 2013-05-25 (×2): 40 mg via ORAL
  Filled 2013-05-24 (×2): qty 1

## 2013-05-24 NOTE — Progress Notes (Signed)
Patient O2 sats 98% on RA at rest.  Patient O2 sats 100% during ambulation on RA.  No c/o SOB or dyspnea noted during ambulation.  Pt ambulated x1 around unit.

## 2013-05-24 NOTE — ED Provider Notes (Signed)
CSN: [id number]     Arrival date & time 05/23/13  2045 History   First MD Initiated Contact with Patient 05/23/13 2300     Chief Complaint  Patient presents with  . Shortness of Breath     (Consider location/radiation/quality/duration/timing/severity/associated sxs/prior Treatment) Patient is a 68 y.o. male presenting with shortness of breath. The history is provided by the patient and the spouse.  Shortness of Breath Severity:  Severe Onset quality:  Gradual Timing:  Constant Progression:  Worsening Context: activity   Context: not smoke exposure   Relieved by:  Nothing Worsened by:  Nothing tried Ineffective treatments:  None tried Associated symptoms: cough   Associated symptoms: no chest pain, no fever, no sputum production and no vomiting   Associated symptoms comment:  Night sweats and chills Cough:    Cough characteristics:  Non-productive   Severity:  Moderate   Onset quality:  Gradual Risk factors: no recent surgery   Has been seen by pulmonary had a "URI" at Thanksgiving.  Is getting chemo with Avastatin and Alimpta.  Has had a negative VQ this week, but can't even walk a few steps without getting winded.    Past Medical History  Diagnosis Date  . Hypertension   . Arthritis RIGHT SHOULDER  . Immature cataract BILATERAL  . Non-small cell lung cancer DX SEPT 2010  W/ CHEMORADIATION AT THAT TIME -- NOW  W/ METS--  CURRENTLY ON MAINTENANCE CHEMO TX  EVERY 30 DAYS    ONCOLOGIST- DR [facility]  . Acute meniscal tear of knee RIGHT KNEE  . BPH (benign prostatic hypertrophy)    Past Surgical History  Procedure Laterality Date  . 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 Rotator cuff repair  2008    LEFT SHOULDER  . Bilateral ear drum surgery  1960'S  . Orif left ring finger  and revascularization of radial side  02-16-2007    DEGLOVING INJURY  . Knee arthroscopy  12/21/2011    Procedure: ARTHROSCOPY  KNEE;  Surgeon: Tobi Bastos, MD;  Location: [facility];  Service: Orthopedics;  Laterality: Right;  WITH MEDIAL MENISECTOMY   Family History  Problem Relation Age of Onset  . Colon cancer    . Heart attack    . Cancer    . Hypertension    . Hyperlipidemia     History  Substance Use Topics  . Smoking status: Former Smoker -- 2.00 packs/day for 20 years    Types: Cigarettes    Quit date: 03/17/1981  . Smokeless tobacco: Never Used  . Alcohol Use: 3.5 oz/week    7 drink(s) per week     Comment: beer daily    Review of Systems  Constitutional: Positive for chills and appetite change. Negative for fever.  Respiratory: Positive for cough and shortness of breath. Negative for sputum production.   Cardiovascular: Negative for chest pain and palpitations.  Gastrointestinal: Negative for vomiting.  All other systems reviewed and are negative.      Allergies  Carboplatin; Percocet; and Zolpidem tartrate  Home Medications   Current Outpatient Rx  Name  Route  Sig  Dispense  Refill  . amLODipine (NORVASC) 2.5 MG tablet   Oral   Take 2.5 mg by mouth every morning.          . Ascorbic Acid (VITAMIN C) 1000 MG tablet   Oral   Take 1,000 mg by mouth daily.         Marland Kitchen  atenolol (TENORMIN) 100 MG tablet   Oral   Take 100 mg by mouth every morning.          . B Complex-Biotin-FA (B-COMPLEX PO)   Oral   Take 1 tablet by mouth daily. 1500mg  daily         . cetirizine (ZYRTEC) 10 MG tablet   Oral   Take 10 mg by mouth daily.         . diphenhydrAMINE (BENADRYL) 25 mg capsule   Oral   Take 25 mg by mouth every 6 (six) hours as needed.         . doxazosin (CARDURA) 2 MG tablet   Oral   Take 2 mg by mouth at bedtime.          . FeFum-FePoly-FA-B Cmp-C-Biot (FOLIVANE-PLUS) CAPS   Oral   Take 1 capsule by mouth daily.   90 capsule   3   . folic acid (FOLVITE) 1 MG tablet   Oral   Take 1 tablet (1 mg total) by mouth daily.   90 tablet    1   . HYDROcodone-homatropine (HYDROMET) 5-1.5 MG/5ML syrup   Oral   Take 5 mLs by mouth every 6 (six) hours as needed for cough.   240 mL   0   . Multiple Vitamin (MULTIVITAMIN) tablet   Oral   Take 1 tablet by mouth daily.          Marland Kitchen omeprazole (PRILOSEC) 40 MG capsule   Oral   Take 1 capsule (40 mg total) by mouth every evening.   90 capsule   3   . prochlorperazine (COMPAZINE) 10 MG tablet   Oral   Take 1 tablet (10 mg total) by mouth every 6 (six) hours as needed.   30 tablet   0   . Sennosides-Docusate Sodium (SENOKOT S PO)   Oral   Take 2 tablets by mouth daily. 2 tabs daily         . tamsulosin (FLOMAX) 0.4 MG CAPS capsule   Oral   Take 1 capsule by mouth daily.          BP 116/66  Pulse 85  Temp(Src) 98.3 F (36.8 C) (Oral)  Resp 20  SpO2 100% Physical Exam  Constitutional: He is oriented to person, place, and time. He appears well-developed and well-nourished.  HENT:  Head: Normocephalic and atraumatic.  Mouth/Throat: Oropharynx is clear and moist.  Eyes: Conjunctivae are normal. Pupils are equal, round, and reactive to light.  Neck: Normal range of motion. Neck supple.  Cardiovascular: Normal rate, regular rhythm and intact distal pulses.   Pulmonary/Chest: He has decreased breath sounds. He has no wheezes.  Abdominal: Soft. Bowel sounds are normal. There is no tenderness. There is no rebound and no guarding.  Musculoskeletal: He exhibits no edema.  Neurological: He is alert and oriented to person, place, and time. He has normal reflexes.  Skin: Skin is warm and dry. There is pallor.  Psychiatric: He has a normal mood and affect.    ED Course  Procedures (including critical care time) Labs Review Labs Reviewed  BASIC METABOLIC PANEL - Abnormal; Notable for the following:    Sodium 135 (*)    Glucose, Bld 111 (*)    BUN 44 (*)    Creatinine, Ser 2.49 (*)    GFR calc non Af Amer 25 (*)    GFR calc Af Amer 29 (*)    All other components  within normal limits  CBC -  Abnormal; Notable for the following:    WBC 2.3 (*)    RBC 2.11 (*)    Hemoglobin 7.9 (*)    HCT 22.9 (*)    MCV 108.5 (*)    MCH 37.4 (*)    RDW 16.5 (*)    Platelets 59 (*)    All other components within normal limits  BLOOD GAS, ARTERIAL  I-STAT TROPOININ, ED   Imaging Review Dg Chest 2 View  05/23/2013   CLINICAL DATA:  Shortness of breath and weakness.  EXAM: CHEST  2 VIEW  COMPARISON:  Chest radiograph performed 05/21/2013  FINDINGS: The lungs are well-aerated and appear grossly clear. There is no evidence of focal opacification, pleural effusion or pneumothorax.  The heart is normal in size; the mediastinal contour is within normal limits. No acute osseous abnormalities are seen. A right-sided chest port is noted ending about the distal SVC.  IMPRESSION: No acute cardiopulmonary process seen.   Electronically Signed   By: Garald Balding M.D.   On: 05/23/2013 23:41    EKG Interpretation   None       MDM   Final diagnoses:  None     Date: 05/24/2013  Rate: 94  Rhythm: normal sinus rhythm  QRS Axis: normal  Intervals: normal  ST/T Wave abnormalities: normal  Conduction Disutrbances: none  Narrative Interpretation: unremarkable  Becomes tachypneic and tachycardic and extremely dyspneic on ambulation  Case d/w Dr. [place] Bells, admit to medicine, pulmonology will see in am   Medications  albuterol (PROVENTIL) (2.5 MG/3ML) 0.083% nebulizer solution 5 mg (5 mg Nebulization Given 05/24/13 0022)  ipratropium (ATROVENT) nebulizer solution 0.5 mg (0.5 mg Nebulization Given 05/24/13 0022)  sodium chloride 0.9 % bolus 500 mL (0 mLs Intravenous Stopped 05/24/13 0107)     Kelly Ranieri Alfonso Patten, MD 05/24/13 6440

## 2013-05-24 NOTE — Consult Note (Signed)
PCCM  Name: [name] MRN: [id number] DOB: [birth year]    ADMISSION DATE:  05/23/2013 CONSULTATION DATE:  05/24/2013  REFERRING MD :  Rockne Menghini PRIMARY SERVICE:  TRH  CHIEF COMPLAINT:  dyspnea  BRIEF PATIENT DESCRIPTION: 68 year old male with metastatic non-small cell lung cancer adenocarcinoma diagnosed in September 2010. Currently getting  maintenance chemo. Has been followed by Dr Elsworth Soho in the past. Admitted 2/26 with increased dyspnea.    SIGNIFICANT EVENTS / STUDIES:  2/23 - VQ scan > No evidence of PE 2/26 - 2D echo >  LINES / TUBES: R chest Port  CULTURES: Blood 2/26 >>>  ANTIBIOTICS: None  HISTORY OF PRESENT ILLNESS:  68 year old male with PMH as below, which includes metastatic non-small cell lung cancer adenocarcinoma. He was diagnosed in 2010 is currently on maintenance chemo of Alimta 500 mg/M2 and Avastin 15 mg/kg every 3 weeks. His recent CT scan (04/02/13)of the chest, abdomen and pelvis showed no significant evidence for disease progression.  He was previously followed by Dr. Elsworth Soho, but saw Tammy in the office 2/9. He had cough for 2 years but it and dyspnea have gotten significantly worse over the last several months, and was recently placed on prednisone and augmentin. Initially he had improvement and felt the best he had for some time, he quickly declined back to feeling SOB and cough again while still on meds. Came to ED 2/26 after symptoms progressed further.   He reports that on a good day he has very little limitation due to dyspnea, but the last few months and especially this past week he has been unable to complete even simple tasks without becoming very SOB. The day before admission it took him 4 hours to take a small nightstand out of the box.  PAST MEDICAL HISTORY :  Past Medical History  Diagnosis Date  . Hypertension   . Arthritis RIGHT SHOULDER  . Immature cataract BILATERAL  . Non-small cell lung cancer DX SEPT 2010  W/ CHEMORADIATION AT THAT TIME --  NOW  W/ METS--  CURRENTLY ON MAINTENANCE CHEMO TX  EVERY 30 DAYS    ONCOLOGIST- [facility]  . Acute meniscal tear of knee RIGHT KNEE  . BPH (benign prostatic hypertrophy)    Past Surgical History  Procedure Laterality Date  . 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 Rotator cuff repair  2008    LEFT SHOULDER  . Bilateral ear drum surgery  1960'S  . Orif left ring finger  and revascularization of radial side  02-16-2007    DEGLOVING INJURY  . Knee arthroscopy  12/21/2011    Procedure: ARTHROSCOPY KNEE;  Surgeon: Tobi Bastos, MD;  Location: [facility];  Service: Orthopedics;  Laterality: Right;  WITH MEDIAL MENISECTOMY   Prior to Admission medications   Medication Sig Start Date End Date Taking? Authorizing Provider  amLODipine (NORVASC) 2.5 MG tablet Take 2.5 mg by mouth every morning.  01/13/11  Yes Historical Provider, MD  Ascorbic Acid (VITAMIN C) 1000 MG tablet Take 1,000 mg by mouth daily.   Yes Historical Provider, MD  atenolol (TENORMIN) 100 MG tablet Take 100 mg by mouth every morning.    Yes Historical Provider, MD  B Complex-Biotin-FA (B-COMPLEX PO) Take 1 tablet by mouth daily. 1500mg  daily   Yes Historical Provider, MD  cetirizine (ZYRTEC) 10 MG tablet Take 10 mg by mouth daily.   Yes Historical Provider,  MD  diphenhydrAMINE (BENADRYL) 25 mg capsule Take 25 mg by mouth every 6 (six) hours as needed.   Yes Historical Provider, MD  doxazosin (CARDURA) 2 MG tablet Take 2 mg by mouth at bedtime.  12/18/10  Yes Historical Provider, MD  FeFum-FePoly-FA-B Cmp-C-Biot (FOLIVANE-PLUS) CAPS Take 1 capsule by mouth daily. 12/22/12  Yes [name], MD  folic acid (FOLVITE) 1 MG tablet Take 1 tablet (1 mg total) by mouth daily. 12/22/12  Yes [name], MD  HYDROcodone-homatropine (HYDROMET) 5-1.5 MG/5ML syrup Take 5 mLs by mouth every 6 (six) hours as needed for cough. 05/07/13  Yes Tammy S Parrett,  NP  Multiple Vitamin (MULTIVITAMIN) tablet Take 1 tablet by mouth daily.    Yes Historical Provider, MD  omeprazole (PRILOSEC) 40 MG capsule Take 1 capsule (40 mg total) by mouth every evening. 02/13/13  Yes Carlton Adam, PA-C  prochlorperazine (COMPAZINE) 10 MG tablet Take 1 tablet (10 mg total) by mouth every 6 (six) hours as needed. 11/07/12  Yes [name], MD  Sennosides-Docusate Sodium (SENOKOT S PO) Take 2 tablets by mouth daily. 2 tabs daily   Yes Historical Provider, MD  tamsulosin (FLOMAX) 0.4 MG CAPS capsule Take 1 capsule by mouth daily. 05/02/13  Yes Historical Provider, MD   Allergies  Allergen Reactions  . Carboplatin     Rash and itching after carbo test dose  . Percocet [Oxycodone-Acetaminophen] Nausea And Vomiting  . Zolpidem Tartrate Diarrhea    FAMILY HISTORY:  Family History  Problem Relation Age of Onset  . Colon cancer    . Heart attack    . Cancer    . Hypertension    . Hyperlipidemia     SOCIAL HISTORY:  reports that he quit smoking about 32 years ago. His smoking use included Cigarettes. He has a 40 pack-year smoking history. He has never used smokeless tobacco. He reports that he drinks about 3.5 ounces of alcohol per week. His drug history is not on file.  REVIEW OF SYSTEMS:   Bolds are positive  Constitutional: weight loss, gain, night sweats, Fevers, chills, fatigue .  HEENT: headaches, Sore throat, sneezing, nasal congestion, post nasal drip, Difficulty swallowing, Tooth/dental problems, visual complaints visual changes, ear ache CV:  chest pain, radiates: ,Orthopnea, PND, trace pedal edema, dizziness, palpitations, syncope.  GI  heartburn, indigestion, abdominal pain, nausea, vomiting, diarrhea, change in bowel habits, loss of appetite, bloody stools.  Resp: cough, productive: , hemoptysis, dyspnea, chest pain, pleuritic.  Skin: rash or itching or icterus GU: dysuria, change in color of urine, urgency or frequency. flank pain, hematuria  MS:  joint pain or swelling. decreased range of motion  Psych: change in mood or affect. depression or anxiety.  Neuro: difficulty with speech, weakness, numbness, ataxia  .  SUBJECTIVE:   VITAL SIGNS: Temp:  [97.9 F (36.6 C)-99.3 F (37.4 C)] 97.9 F (36.6 C) (02/26 0837) Pulse Rate:  [80-88] 84 (02/26 0837) Resp:  [13-20] 14 (02/26 0837) BP: (116-142)/(66-81) 128/78 mmHg (02/26 0837) SpO2:  [97 %-100 %] 100 % (02/26 0837) Weight:  [61.054 kg (134 lb 9.6 oz)] 61.054 kg (134 lb 9.6 oz) (02/26 0425)  PHYSICAL EXAMINATION: General: male of normal body habitus in no acute distress Neuro:  Alert, oriented HEENT:  [place]/AT no JVD noted Cardiovascular:  RRR Lungs:  Clear to auscultation entire posterior chest Abdomen:  Soft, non-tender Musculoskeletal:  Intact , no acute deformity Skin:  intact   Recent Labs Lab 05/23/13 2100 05/24/13 1048  NA 135*  137  K 4.6 4.4  CL 101 105  CO2 20 19  BUN 44* 42*  CREATININE 2.49* 2.19*  GLUCOSE 111* 105*    Recent Labs Lab 05/23/13 2100 05/24/13 1048  HGB 7.9* 7.5*  HCT 22.9* 21.5*  WBC 2.3* 1.8*  PLT 59* 36*   BNP    Component Value Date/Time   PROBNP 841.8* 05/24/2013 0325    CXR:.   ASSESSMENT / PLAN:  Dyspnea - not noted to be hypoxic at all during this admission or with ambulation during previous office visits. Likely not related to PE as VQ scan from 3 days ago is negative and similar symptoms were present. BNP is elevated, but there does not appear to be pulmonary edema on CXR.  Rec's:  -Full PFT today -Recheck CXR in am -F/u Echo  Georgann Housekeeper, ACNP [facility] Pulmonology/Critical Care Pager [phone] or [phone]  05/24/2013, 12:29 PM  PCCM ATTENDING: I have interviewed and examined the patient and reviewed the database. I have formulated the assessment and plan as reflected in the note above with amendments made by me.   The cause of his profound dyspnea has eluded diagnosis for several months and  despite thorough evaluation by Dr Elsworth Soho. It is possible that there are multiple factors contributing inc anemia, mild (by previous PFTs) obstructive lung disease and other as yet undefined contributors. He has a chronically mildly low HCO3 on chemistry panels. His ABG seems to show chronic resp alkalosis with compensation. Could there be a component of anxiety/hyperventilation syndrome? A recent V/Q scan failed to demonstrate evidence of PE  Will get full PFTs and check for exertional desaturation. If these are unrevealing, he can likely be discharged to home 2/27 with plans to F/U with Dr Elsworth Soho. Would consider CPST to better direct any further evaluation.    Merton Border, MD;  PCCM service; Mobile [phone]

## 2013-05-24 NOTE — H&P (Signed)
PCP:   [name], MD   Chief Complaint:  doe  HPI: 68 yo male with sob for past 2 years, and worsening doe for at least 2 months.  He has lung cancer for past 4 years, on palliative chemotherapy q 3 weeks.  Denies fevers.  Coughing for months.  Recently put on amoxil and prednisone by his pulmonologist, really helped for a couple days then back to same.  No hemoptysis.  C/o chills and always cold.  His sob gets much worse with exertion.  He has had his oxygen sats checked several times with ambulation and they are always normal.  Denies any le swelling or edema.  No cp.  No cardiac problems that he knows of.  He was suppose to get some PFTs done and pulm appt this past tues but was cancelled due to snow.  He was rescheduled for that for Thursday, today, but it is snowing again.  He has gotten more weak in the last couple weeks also, and very fatigued easily.  No bleeding issues.  Sometimes has nosebleeds, but none now.  Has chronic hemorrhoids that occasionally bleed mildly, but none now.  We are asked to admit for his doe.  No pnd or orthopnea.  Has also not noted any wheezing.  Review of Systems:  Positive and negative as per HPI otherwise all other systems are negative  Past Medical History: Past Medical History  Diagnosis Date  . Hypertension   . Arthritis RIGHT SHOULDER  . Immature cataract BILATERAL  . Non-small cell lung cancer DX SEPT 2010  W/ CHEMORADIATION AT THAT TIME -- NOW  W/ METS--  CURRENTLY ON MAINTENANCE CHEMO TX  EVERY 30 DAYS    ONCOLOGIST- DR [facility]  . Acute meniscal tear of knee RIGHT KNEE  . BPH (benign prostatic hypertrophy)    Past Surgical History  Procedure Laterality Date  . 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 Rotator cuff repair  2008    LEFT SHOULDER  . Bilateral ear drum surgery  1960'S  . Orif left ring finger  and revascularization of radial side  02-16-2007    DEGLOVING  INJURY  . Knee arthroscopy  12/21/2011    Procedure: ARTHROSCOPY KNEE;  Surgeon: Tobi Bastos, MD;  Location: [facility];  Service: Orthopedics;  Laterality: Right;  WITH MEDIAL MENISECTOMY    Medications: Prior to Admission medications   Medication Sig Start Date End Date Taking? Authorizing Provider  amLODipine (NORVASC) 2.5 MG tablet Take 2.5 mg by mouth every morning.  01/13/11  Yes Historical Provider, MD  Ascorbic Acid (VITAMIN C) 1000 MG tablet Take 1,000 mg by mouth daily.   Yes Historical Provider, MD  atenolol (TENORMIN) 100 MG tablet Take 100 mg by mouth every morning.    Yes Historical Provider, MD  B Complex-Biotin-FA (B-COMPLEX PO) Take 1 tablet by mouth daily. 1500mg  daily   Yes Historical Provider, MD  cetirizine (ZYRTEC) 10 MG tablet Take 10 mg by mouth daily.   Yes Historical Provider, MD  diphenhydrAMINE (BENADRYL) 25 mg capsule Take 25 mg by mouth every 6 (six) hours as needed.   Yes Historical Provider, MD  doxazosin (CARDURA) 2 MG tablet Take 2 mg by mouth at bedtime.  12/18/10  Yes Historical Provider, MD  FeFum-FePoly-FA-B Cmp-C-Biot (FOLIVANE-PLUS) CAPS Take 1 capsule by mouth daily. 12/22/12  Yes [name], MD  folic acid (FOLVITE) 1 MG tablet Take  1 tablet (1 mg total) by mouth daily. 12/22/12  Yes [name], MD  HYDROcodone-homatropine (HYDROMET) 5-1.5 MG/5ML syrup Take 5 mLs by mouth every 6 (six) hours as needed for cough. 05/07/13  Yes Tammy S Parrett, NP  Multiple Vitamin (MULTIVITAMIN) tablet Take 1 tablet by mouth daily.    Yes Historical Provider, MD  omeprazole (PRILOSEC) 40 MG capsule Take 1 capsule (40 mg total) by mouth every evening. 02/13/13  Yes Carlton Adam, PA-C  prochlorperazine (COMPAZINE) 10 MG tablet Take 1 tablet (10 mg total) by mouth every 6 (six) hours as needed. 11/07/12  Yes [name], MD  Sennosides-Docusate Sodium (SENOKOT S PO) Take 2 tablets by mouth daily. 2 tabs daily   Yes Historical Provider, MD   tamsulosin (FLOMAX) 0.4 MG CAPS capsule Take 1 capsule by mouth daily. 05/02/13  Yes Historical Provider, MD    Allergies:   Allergies  Allergen Reactions  . Carboplatin     Rash and itching after carbo test dose  . Percocet [Oxycodone-Acetaminophen] Nausea And Vomiting  . Zolpidem Tartrate Diarrhea    Social History:  reports that he quit smoking about 32 years ago. His smoking use included Cigarettes. He has a 40 pack-year smoking history. He has never used smokeless tobacco. He reports that he drinks about 3.5 ounces of alcohol per week. His drug history is not on file.  Family History: Family History  Problem Relation Age of Onset  . Colon cancer    . Heart attack    . Cancer    . Hypertension    . Hyperlipidemia      Physical Exam: Filed Vitals:   05/24/13 0022 05/24/13 0100 05/24/13 0200 05/24/13 0224  BP:    132/74  Pulse:  88 88 86  Temp:    98.4 F (36.9 C)  TempSrc:    Oral  Resp:  16 19 18   SpO2: 100% 100% 97% 98%   General appearance: alert, cooperative and no distress Head: Normocephalic, without obvious abnormality, atraumatic Eyes: negative Nose: Nares normal. Septum midline. Mucosa normal. No drainage or sinus tenderness. Neck: no JVD and supple, symmetrical, trachea midline Lungs: clear to auscultation bilaterally Heart: regular rate and rhythm, S1, S2 normal, no murmur, click, rub or gallop Abdomen: soft, non-tender; bowel sounds normal; no masses,  no organomegaly Extremities: extremities normal, atraumatic, no cyanosis or edema Pulses: 2+ and symmetric Skin: Skin color, texture, turgor normal. No rashes or lesions Neurologic: Grossly normal    Labs on Admission:   Recent Labs  05/23/13 2100  NA 135*  K 4.6  CL 101  CO2 20  GLUCOSE 111*  BUN 44*  CREATININE 2.49*  CALCIUM 9.4    Recent Labs  05/23/13 2100  WBC 2.3*  HGB 7.9*  HCT 22.9*  MCV 108.5*  PLT 59*    Radiological Exams on Admission: Dg Chest 2 View  05/23/2013    CLINICAL DATA:  Shortness of breath and weakness.  EXAM: CHEST  2 VIEW  COMPARISON:  Chest radiograph performed 05/21/2013  FINDINGS: The lungs are well-aerated and appear grossly clear. There is no evidence of focal opacification, pleural effusion or pneumothorax.  The heart is normal in size; the mediastinal contour is within normal limits. No acute osseous abnormalities are seen. A right-sided chest port is noted ending about the distal SVC.  IMPRESSION: No acute cardiopulmonary process seen.   Electronically Signed   By: Garald Balding M.D.   On: 05/23/2013 23:41   Nm Pulmonary Per &  Vent  05/21/2013   CLINICAL DATA:  Shortness of breath.  History of lung cancer.  EXAM: NUCLEAR MEDICINE VENTILATION - PERFUSION LUNG SCAN  TECHNIQUE: Ventilation images were obtained in multiple projections using inhaled aerosol technetium 99 M DTPA. Perfusion images were obtained in multiple projections after intravenous injection of Tc-47m MAA.  COMPARISON:  PA and lateral chest 05/21/2013 and 1319 hr.  RADIOPHARMACEUTICALS:  40 mCi Tc-77m DTPA aerosol and 6 mCi Tc-28m MAA  FINDINGS: Ventilation: No focal ventilation defect.  Perfusion: No wedge shaped peripheral perfusion defects to suggest acute pulmonary embolism.  IMPRESSION: Negative for pulmonary embolus.   Electronically Signed   By: Inge Rise M.D.   On: 05/21/2013 14:16    Assessment/Plan  68 yo male with worsening doe, fatigue, also new pancytopenia with h/o lung cancer adenocarcinoma  Principal Problem:   DOE (dyspnea on exertion)-  Unclear if this is due to his worsening anemia, or underlying cardiac dysfunction or from pulm standpoint.  Will transfuse one unit prbc overnight.  Serial cardiac enzymes and ck echo in am.  pulm has also been consulted and will see in am.  cxr unrevealing for any acute infection.   He has been told by his oncologist that this is not due to side effects from the chemo he is on.  Was r/o for PE a couple of days ago, so  doubt this.  Further recommendations and w/u pending above results and pulm eval.  Will hold off on any abx or steroids at this time, just completed round of both which did not seem to do anything for long.     Active Problems:   LUNG/BRONCUS CANCER, UPPER LOBE   Hypertension   Acute on chronic renal failure   Pancytopenia    Shirelle Tootle A 05/24/2013, 3:31 AM

## 2013-05-24 NOTE — Progress Notes (Signed)
UR completed 

## 2013-05-24 NOTE — Progress Notes (Signed)
Echo Lab  2D Echocardiogram completed.  Canal Fulton, RDCS 05/24/2013 10:41 AM

## 2013-05-24 NOTE — Progress Notes (Addendum)
PROGRESS NOTE   Sevon Rotert ZOX:[id number] DOB: 1945-11-14 DOA: 05/23/2013 PCP: [name], MD  Brief narrative: Fouad Taul is an 68 y.o. male with a PMH of metastatic non-small cell lung cancer diagnosed 11/2008, currently receiving palliative chemotherapy, with last chemotherapy given 05/15/13 (Alimta and Avastin), recently treated with amoxicillin prednisone for worsening shortness of breath and cough, was admitted 05/24/13 with worsening dyspnea. Upon initial evaluation in the ED, chest x-ray showed no acute abnormalities. He had a negative VQ scan 05/21/13. His BNP was elevated at 841. He was pancytopenic with a hemoglobin of 7.9.  Assessment/Plan: Principal Problem:   DOE (dyspnea on exertion) Likely multifactorial with underlying lung cancer, anemia, and possible CHF contributory. 2-D echocardiogram has been done, EF 55-60% with no regional wall motion abnormalities. He was given one unit of packed red blood cells to treat his anemia. Pulmonology consultation has been performed with recommendations for full PFTs. Recently completed a course of steroids and antibiotics, so these were not empirically ordered at this time. Active Problems:   LUNG/BRONCUS CANCER, UPPER LOBE Last chemotherapy given 05/15/13. We'll notify Dr. [name] of the patient's admission.   Hypertension Continue Norvasc, atenolol, and Cardura. Blood pressure controlled.   Acute on chronic renal failure Baseline creatinine 1.7. Current creatinine 2.49. May be induced from Alimta.  At this point, there is no evidence of heart failure radiographically or on 2 D Echo, so will gently hydrate.   Anti-neoplastic induced pancytopenia Monitor counts. Received one unit of packed red blood cells. No current indication for platelet transfusion or Neupogen.   DVT Prophylaxis SCDs.  Code Status: Full. Family Communication: No family at bedside. Disposition Plan: Home when stable.   IV  access:  Port-A-Cath  Medical Consultants:  Pulmonology  Other Consultants:  None  Anti-infectives: Rocephin 05/24/13---> 05/24/13  HPI/Subjective: Raynelle Chary still feels short of breath, but not as bad as yesterday.  He gets extremely fatigued with limited activity.  He has a dry cough and nasal congestion.  No nausea or vomiting.  Objective: Filed Vitals:   05/24/13 0425 05/24/13 0515 05/24/13 0539 05/24/13 0639  BP: 137/81 135/76 142/72 134/73  Pulse: 86 80 86 82  Temp: 98.1 F (36.7 C) 98.4 F (36.9 C) 99.3 F (37.4 C) 97.9 F (36.6 C)  TempSrc: Oral Oral Oral Oral  Resp: 16 16 18 14   Height: 5\' 8"  (1.727 m)     Weight: 61.054 kg (134 lb 9.6 oz)     SpO2: 99% 100% 100% 99%    Intake/Output Summary (Last 24 hours) at 05/24/13 8119 Last data filed at 05/24/13 0524  Gross per 24 hour  Intake    250 ml  Output      0 ml  Net    250 ml    Exam: Gen:  NAD Cardiovascular:  RRR, No M/R/G Respiratory:  Lungs CTAB, no wheezes or rales. Gastrointestinal:  Abdomen soft, NT/ND, + BS Extremities:  No C/E/C  Data Reviewed: Basic Metabolic Panel:  Recent Labs Lab 05/17/13 1127 05/23/13 2100  NA 137 135*  K 4.4 4.6  CL 109 101  CO2 22 20  GLUCOSE 113* 111*  BUN 31* 44*  CREATININE 1.7* 2.49*  CALCIUM 9.5 9.4   GFR Estimated Creatinine Clearance: 24.5 ml/min (by C-G formula based on Cr of 2.49).  CBC:  Recent Labs Lab 05/23/13 2100  WBC 2.3*  HGB 7.9*  HCT 22.9*  MCV 108.5*  PLT 59*   Cardiac Enzymes:  Recent Labs Lab 05/24/13  0325  TROPONINI <0.30   BNP (last 3 results)  Recent Labs  05/17/13 1127 05/24/13 0325  PROBNP 222.0* 841.8*   Microbiology No results found for this or any previous visit (from the past 240 hour(s)).   Procedures and Diagnostic Studies: Dg Chest 2 View  05/23/2013   CLINICAL DATA:  Shortness of breath and weakness.  EXAM: CHEST  2 VIEW  COMPARISON:  Chest radiograph performed 05/21/2013  FINDINGS: The  lungs are well-aerated and appear grossly clear. There is no evidence of focal opacification, pleural effusion or pneumothorax.  The heart is normal in size; the mediastinal contour is within normal limits. No acute osseous abnormalities are seen. A right-sided chest port is noted ending about the distal SVC.  IMPRESSION: No acute cardiopulmonary process seen.   Electronically Signed   By: Garald Balding M.D.   On: 05/23/2013 23:41     Scheduled Meds: . amLODipine  2.5 mg Oral q morning - 10a  . atenolol  100 mg Oral q morning - 10a  . doxazosin  2 mg Oral QHS  . pantoprazole  40 mg Oral Daily  . sodium chloride  3 mL Intravenous Q12H  . sodium chloride  3 mL Intravenous Q12H  . tamsulosin  0.4 mg Oral Daily   Continuous Infusions:   Time spent: 30 minutes.   LOS: 1 day   Cougar Imel  [facility] Pager [phone]. If unable to reach me by pager, please call my cell phone at [phone].  *Please note that the hospitalists switch teams on Wednesdays. Please call the flow manager at [phone] if you are having difficulty reaching the hospitalist taking care of this patient as she can update you and provide the most up-to-date pager number of provider caring for the patient. If 8PM-8AM, please contact night-coverage at [email or web address], password [facility]  05/24/2013, 7:28 AM    **Disclaimer: This note was dictated with voice recognition software. Similar sounding words can inadvertently be transcribed and this note may contain transcription errors which may not have been corrected upon publication of note.**

## 2013-05-25 ENCOUNTER — Other Ambulatory Visit (HOSPITAL_COMMUNITY): Payer: Self-pay | Admitting: Pulmonary Disease

## 2013-05-25 ENCOUNTER — Observation Stay (HOSPITAL_COMMUNITY): Payer: Medicare Other

## 2013-05-25 ENCOUNTER — Telehealth: Payer: Self-pay | Admitting: Pulmonary Disease

## 2013-05-25 DIAGNOSIS — R0989 Other specified symptoms and signs involving the circulatory and respiratory systems: Principal | ICD-10-CM

## 2013-05-25 DIAGNOSIS — R0600 Dyspnea, unspecified: Secondary | ICD-10-CM

## 2013-05-25 DIAGNOSIS — D61818 Other pancytopenia: Secondary | ICD-10-CM

## 2013-05-25 DIAGNOSIS — R0609 Other forms of dyspnea: Principal | ICD-10-CM

## 2013-05-25 DIAGNOSIS — J209 Acute bronchitis, unspecified: Secondary | ICD-10-CM

## 2013-05-25 DIAGNOSIS — N179 Acute kidney failure, unspecified: Secondary | ICD-10-CM

## 2013-05-25 DIAGNOSIS — C3410 Malignant neoplasm of upper lobe, unspecified bronchus or lung: Secondary | ICD-10-CM

## 2013-05-25 DIAGNOSIS — N189 Chronic kidney disease, unspecified: Secondary | ICD-10-CM

## 2013-05-25 LAB — PULMONARY FUNCTION TEST
DL/VA % PRED: 49 %
DL/VA: 2.23 ml/min/mmHg/L
DLCO COR % PRED: 45 %
DLCO UNC: 9.82 ml/min/mmHg
DLCO cor: 13.55 ml/min/mmHg
DLCO unc % pred: 33 %
FEF 25-75 Post: 2.03 L/sec
FEF 25-75 Pre: 1.77 L/sec
FEF2575-%Change-Post: 14 %
FEF2575-%PRED-POST: 85 %
FEF2575-%PRED-PRE: 74 %
FEV1-%Change-Post: 4 %
FEV1-%PRED-POST: 100 %
FEV1-%PRED-PRE: 96 %
FEV1-POST: 3.09 L
FEV1-PRE: 2.95 L
FEV1FVC-%Change-Post: 1 %
FEV1FVC-%PRED-PRE: 92 %
FEV6-%CHANGE-POST: 2 %
FEV6-%PRED-PRE: 109 %
FEV6-%Pred-Post: 112 %
FEV6-Post: 4.4 L
FEV6-Pre: 4.29 L
FEV6FVC-%Change-Post: 0 %
FEV6FVC-%PRED-POST: 105 %
FEV6FVC-%Pred-Pre: 105 %
FVC-%CHANGE-POST: 2 %
FVC-%PRED-POST: 106 %
FVC-%Pred-Pre: 103 %
FVC-Post: 4.44 L
FVC-Pre: 4.33 L
POST FEV1/FVC RATIO: 69 %
PRE FEV1/FVC RATIO: 68 %
Post FEV6/FVC ratio: 99 %
Pre FEV6/FVC Ratio: 99 %
RV % pred: 56 %
RV: 1.3 L
TLC % pred: 91 %
TLC: 6.08 L

## 2013-05-25 LAB — BASIC METABOLIC PANEL
BUN: 30 mg/dL — ABNORMAL HIGH (ref 6–23)
CALCIUM: 8.7 mg/dL (ref 8.4–10.5)
CO2: 19 mEq/L (ref 19–32)
CREATININE: 1.95 mg/dL — AB (ref 0.50–1.35)
Chloride: 109 mEq/L (ref 96–112)
GFR calc Af Amer: 39 mL/min — ABNORMAL LOW (ref >90)
GFR, EST NON AFRICAN AMERICAN: 34 mL/min — AB (ref >90)
GLUCOSE: 97 mg/dL (ref 70–99)
Potassium: 4.2 mEq/L (ref 3.7–5.3)
SODIUM: 140 meq/L (ref 137–147)

## 2013-05-25 LAB — CBC
HCT: 22.2 % — ABNORMAL LOW (ref 39.0–52.0)
HEMOGLOBIN: 7.6 g/dL — AB (ref 13.0–17.0)
MCH: 35.3 pg — AB (ref 26.0–34.0)
MCHC: 34.2 g/dL (ref 30.0–36.0)
MCV: 103.3 fL — AB (ref 78.0–100.0)
Platelets: 41 10*3/uL — ABNORMAL LOW (ref 150–400)
RBC: 2.15 MIL/uL — AB (ref 4.22–5.81)
RDW: 19.7 % — ABNORMAL HIGH (ref 11.5–15.5)
WBC: 1.9 10*3/uL — ABNORMAL LOW (ref 4.0–10.5)

## 2013-05-25 LAB — TYPE AND SCREEN
ABO/RH(D): O POS
Antibody Screen: NEGATIVE
Unit division: 0

## 2013-05-25 MED ORDER — AMOXICILLIN-POT CLAVULANATE 875-125 MG PO TABS: 1.0000 | ORAL_TABLET | Freq: Two times a day (BID) | ORAL | Status: DC

## 2013-05-25 MED ORDER — ALBUTEROL SULFATE (2.5 MG/3ML) 0.083% IN NEBU
2.5000 mg | INHALATION_SOLUTION | Freq: Once | RESPIRATORY_TRACT | Status: AC
  Administered 2013-05-25: 2.5 mg via RESPIRATORY_TRACT

## 2013-05-25 MED ORDER — HEPARIN SOD (PORK) LOCK FLUSH 100 UNIT/ML IV SOLN
500.0000 [IU] | INTRAVENOUS | Status: AC | PRN
  Administered 2013-05-25: 500 [IU]

## 2013-05-25 NOTE — Telephone Encounter (Signed)
Spoke with the pt He states his question was already answered about breathing test  Nothing further needed

## 2013-05-25 NOTE — Discharge Instructions (Signed)

## 2013-05-25 NOTE — Discharge Summary (Signed)
Physician Discharge Summary  [name] QDI:[id number] DOB: [birth year] DOA: 05/23/2013  PCP: [name], MD  Admit date: 05/23/2013 Discharge date: 05/25/2013  Recommendations for Outpatient Follow-up:  1. Followup results of pulmonary function tests. 2. Patient referred to Dr. Erik Obey for assessment and evaluation of recurrent epistaxis which is contributing to his anemia and dyspnea. 3. Followup final blood culture results.  Discharge Diagnoses:  Principal Problem:    DOE (dyspnea on exertion) Active Problems:    LUNG/BRONCUS CANCER, UPPER LOBE    Hypertension    Acute on chronic renal failure    Anti-neoplastic related Pancytopenia   Discharge Condition: Improved.  Diet recommendation: Low-sodium, heart healthy.  History of present illness:  [name] is an 68 y.o. male with a PMH of metastatic non-small cell lung cancer diagnosed 11/2008, currently receiving palliative chemotherapy, with last chemotherapy given 05/15/13 (Alimta and Avastin), recently treated with amoxicillin prednisone for worsening shortness of breath and cough, was admitted 05/24/13 with worsening dyspnea. Upon initial evaluation in the ED, chest x-ray showed no acute abnormalities. He had a negative VQ scan 05/21/13. His BNP was elevated at 841. He was pancytopenic with a hemoglobin of 7.9.   Hospital Course by problem:  Principal Problem:  DOE (dyspnea on exertion)  Likely multifactorial with underlying lung cancer, anemia, and possible CHF contributory. 2-D echocardiogram has been done, EF 55-60% with no regional wall motion abnormalities. He was given one unit of packed red blood cells to treat his anemia. Pulmonology consultation has been performed with recommendations for full PFTs, which were done prior to discharge. Recently completed a course of steroids and antibiotics, so these were not empirically ordered on admission. Patient was discharged on a seven-day course of  amoxicillin. Active Problems:  LUNG/BRONCUS CANCER, UPPER LOBE  Last chemotherapy given 05/15/13. Seen by Dr. [name] 05/25/13 who he will followup with post discharge.  Hypertension  Continue Norvasc, atenolol, and Cardura. Blood pressure controlled.  Acute on chronic renal failure  Baseline creatinine 1.7. Current creatinine 1.95. Transient rise may have been related to Alimta. At this point, there is no evidence of heart failure radiographically or on 2 D Echo, so he was gently hydrated.  Anti-neoplastic induced pancytopenia  Patient's blood counts were closely monitored. Received one unit of packed red blood cells. No current indication for platelet transfusion or Neupogen.  Procedures:  2-D echo 05/24/13: EF 55-60% with no regional wall motion abnormalities.  Consultations:  Dr. Merton Border, Pulmonology  Discharge Exam: Filed Vitals:   05/25/13 1316  BP: 160/81  Pulse: 80  Temp: 97.9 F (36.6 C)  Resp: 18   Filed Vitals:   05/25/13 0403 05/25/13 0606 05/25/13 1013 05/25/13 1316  BP: 94/66 142/74 145/72 160/81  Pulse: 92 79  80  Temp: 98.6 F (37 C) 98.5 F (36.9 C)  97.9 F (36.6 C)  TempSrc: Oral Oral  Oral  Resp: 18 18  18   Height:      Weight:  61.417 kg (135 lb 6.4 oz)    SpO2: 96% 99%  98%    Gen:  NAD Cardiovascular:  RRR, No M/R/G Respiratory: Lungs CTAB Gastrointestinal: Abdomen soft, NT/ND with normal active bowel sounds. Extremities: No C/E/C   Discharge Instructions  Discharge Orders   Future Appointments Provider Department Dept Phone   06/04/2013 7:30 AM [facility] [phone]   Liquids only 4 hours prior to your exam. Any medications can be taken as usual. Please arrive 15 min prior to your  scheduled exam time.   06/05/2013 8:15 AM [facility] [phone]   06/05/2013 8:45 AM [name], MD [facility] [phone]    06/05/2013 9:45 AM [facility] [phone]   06/26/2013 9:15 AM [facility] [phone]   06/26/2013 9:45 AM [facility] [phone]   Future Orders Complete By Expires   Call MD for:  persistant nausea and vomiting  As directed    Call MD for:  temperature >100.4  As directed    Call MD for:  As directed    Scheduling Instructions:     Worsening shortness of breath.   Diet - low sodium heart healthy  As directed    Discharge instructions  As directed    Comments:     You were cared for by Dr. Jacquelynn Cree  (a hospitalist) during your hospital stay. If you have any questions about your discharge medications or the care you received while you were in the hospital after you are discharged, you can call the unit and ask to speak with the hospitalist on call if the hospitalist that took care of you is not available. Once you are discharged, your primary care physician will handle any further medical issues. Please note that NO REFILLS for any discharge medications will be authorized once you are discharged, as it is imperative that you return to your primary care physician (or establish a relationship with a primary care physician if you do not have one) for your aftercare needs so that they can reassess your need for medications and monitor your lab values.  Any outstanding tests can be reviewed by your PCP at your follow up visit.  It is also important to review any medicine changes with your PCP.  Please bring these d/c instructions with you to your next visit so your physician can review these changes with you.  If you do not have a primary care physician, you can call [phone] for a physician referral.  It is highly recommended that you obtain a PCP for hospital follow up.   Increase activity slowly  As directed        Medication List         amLODipine  2.5 MG tablet  Commonly known as:  NORVASC  Take 2.5 mg by mouth every morning.     amoxicillin-clavulanate 875-125 MG per tablet  Commonly known as:  AUGMENTIN  Take 1 tablet by mouth 2 (two) times daily.     atenolol 100 MG tablet  Commonly known as:  TENORMIN  Take 100 mg by mouth every morning.     B-COMPLEX PO  Take 1 tablet by mouth daily. 1500mg  daily     cetirizine 10 MG tablet  Commonly known as:  ZYRTEC  Take 10 mg by mouth daily.     diphenhydrAMINE 25 mg capsule  Commonly known as:  BENADRYL  Take 25 mg by mouth every 6 (six) hours as needed.     doxazosin 2 MG tablet  Commonly known as:  CARDURA  Take 2 mg by mouth at bedtime.     folic acid 1 MG tablet  Commonly known as:  FOLVITE  Take 1 tablet (1 mg total) by mouth daily.     FOLIVANE-PLUS Caps  Take 1 capsule by mouth daily.     HYDROcodone-homatropine 5-1.5 MG/5ML syrup  Commonly known as:  HYDROMET  Take 5 mLs by mouth every 6 (six) hours as needed for cough.     multivitamin tablet  Take 1 tablet by mouth daily.     omeprazole 40 MG capsule  Commonly known as:  PRILOSEC  Take 1 capsule (40 mg total) by mouth every evening.     prochlorperazine 10 MG tablet  Commonly known as:  COMPAZINE  Take 1 tablet (10 mg total) by mouth every 6 (six) hours as needed.     SENOKOT S PO  Take 2 tablets by mouth daily. 2 tabs daily     tamsulosin 0.4 MG Caps capsule  Commonly known as:  FLOMAX  Take 1 capsule by mouth daily.     vitamin C 1000 MG tablet  Take 1,000 mg by mouth daily.           Follow-up Information   Schedule an appointment as soon as possible for a visit with [name], MD. (As needed)    Specialty:  Family Medicine   Contact information:   [place] [address] [phone]       Follow up with Eilleen Kempf., MD. (At your scheduled appt in 2 weeks.)    Specialty:  Oncology   Contact information:   [place]  [address] [phone]       Follow up with Rigoberto Noel., MD. (At your scheduled appt time.)    Specialty:  Pulmonary Disease   Contact information:   [address] [place] [address] [phone]       Follow up with Jodi Marble, MD On 09/30/1698. (4:10 pm)    Specialty:  Otolaryngology   Contact information:   [address] [place] [address] [phone]        The results of significant diagnostics from this hospitalization (including imaging, microbiology, ancillary and laboratory) are listed below for reference.    Significant Diagnostic Studies: Dg Chest 2 View  05/23/2013   CLINICAL DATA:  Shortness of breath and weakness.  EXAM: CHEST  2 VIEW  COMPARISON:  Chest radiograph performed 05/21/2013  FINDINGS: The lungs are well-aerated and appear grossly clear. There is no evidence of focal opacification, pleural effusion or pneumothorax.  The heart is normal in size; the mediastinal contour is within normal limits. No acute osseous abnormalities are seen. A right-sided chest port is noted ending about the distal SVC.  IMPRESSION: No acute cardiopulmonary process seen.   Electronically Signed   By: Garald Balding M.D.   On: 05/23/2013 23:41   Dg Chest 2 View  05/21/2013   CLINICAL DATA:  Dyspnea.  Elevated D-dimer.  EXAM: CHEST  2 VIEW  COMPARISON:  Two-view chest x-ray 05/07/2013.  FINDINGS: The heart size is normal. Emphysematous changes are again noted. No focal airspace disease is present. A right IJ Port-A-Cath is stable.  IMPRESSION: 1. Emphysema. 2. No acute cardiopulmonary disease.   Electronically Signed   By: Lawrence Santiago M.D.   On: 05/21/2013 15:20   Dg Chest 2 View  05/07/2013   CLINICAL DATA:  Cough, shortness of breath  EXAM: CHEST  2 VIEW  COMPARISON:  CT CHEST W/O CM dated 04/02/2013; DG CHEST 2 VIEW dated 05/18/2011  FINDINGS: Right-sided porta catheter is appreciated with tip projecting in the region of the distal superior vena cava. Cardiac  silhouette within normal limits. Atherosclerotic calcifications identified within the aorta. There is mild flattening of the  hemidiaphragms and increased lucency in the apical regions of the lungs bilaterally. No focal regions of consolidation or focal infiltrates are appreciated. The osseous structures are unremarkable.  IMPRESSION: Findings consistent with COPD without evidence of acute cardiopulmonary disease.   Electronically Signed   By: Margaree Mackintosh M.D.   On: 05/07/2013 17:45   Nm Pulmonary Per & Vent  05/21/2013   CLINICAL DATA:  Shortness of breath.  History of lung cancer.  EXAM: NUCLEAR MEDICINE VENTILATION - PERFUSION LUNG SCAN  TECHNIQUE: Ventilation images were obtained in multiple projections using inhaled aerosol technetium 99 M DTPA. Perfusion images were obtained in multiple projections after intravenous injection of Tc-13m MAA.  COMPARISON:  PA and lateral chest 05/21/2013 and 1319 hr.  RADIOPHARMACEUTICALS:  40 mCi Tc-51m DTPA aerosol and 6 mCi Tc-22m MAA  FINDINGS: Ventilation: No focal ventilation defect.  Perfusion: No wedge shaped peripheral perfusion defects to suggest acute pulmonary embolism.  IMPRESSION: Negative for pulmonary embolus.   Electronically Signed   By: Inge Rise M.D.   On: 05/21/2013 14:16    Labs:  Basic Metabolic Panel:  Recent Labs Lab 05/23/13 2100 05/24/13 1048 05/25/13 0445  NA 135* 137 140  K 4.6 4.4 4.2  CL 101 105 109  CO2 20 19 19   GLUCOSE 111* 105* 97  BUN 44* 42* 30*  CREATININE 2.49* 2.19* 1.95*  CALCIUM 9.4 8.6 8.7   GFR Estimated Creatinine Clearance: 31.5 ml/min (by C-G formula based on Cr of 1.95).  CBC:  Recent Labs Lab 05/23/13 2100 05/24/13 1048 05/25/13 0445  WBC 2.3* 1.8* 1.9*  HGB 7.9* 7.5* 7.6*  HCT 22.9* 21.5* 22.2*  MCV 108.5* 102.4* 103.3*  PLT 59* 36* 41*   Cardiac Enzymes:  Recent Labs Lab 05/24/13 0325 05/24/13 1048 05/24/13 1610  TROPONINI <0.30 <0.30 <0.30   Microbiology Recent Results  (from the past 240 hour(s))  CULTURE, BLOOD (ROUTINE X 2)     Status: None   Collection Time    05/24/13 12:26 AM      Result Value Ref Range Status   Specimen Description BLOOD LEFT HAND   Final   Special Requests BOTTLES DRAWN AEROBIC AND ANAEROBIC 3CC   Final   Culture  Setup Time     Final   Value: 05/24/2013 04:05     Performed at [facility]   Culture     Final   Value:        BLOOD CULTURE RECEIVED NO GROWTH TO DATE CULTURE WILL BE HELD FOR 5 DAYS BEFORE ISSUING A FINAL NEGATIVE REPORT     Performed at [facility]   Report Status PENDING   Incomplete  CULTURE, BLOOD (ROUTINE X 2)     Status: None   Collection Time    05/24/13 12:37 AM      Result Value Ref Range Status   Specimen Description BLOOD PORT   Final   Special Requests BOTTLES DRAWN AEROBIC AND ANAEROBIC 5CC   Final   Culture  Setup Time     Final   Value: 05/24/2013 04:04     Performed at [facility]   Culture     Final   Value:        BLOOD CULTURE RECEIVED NO GROWTH TO DATE CULTURE WILL BE HELD FOR 5 DAYS BEFORE ISSUING A FINAL NEGATIVE REPORT     Performed at [facility]   Report Status PENDING   Incomplete    Time coordinating discharge: 35 minutes.  Signed:  Romaine Neville  Pager [phone] [facility] 05/25/2013, 3:39 PM

## 2013-05-28 ENCOUNTER — Telehealth: Payer: Self-pay | Admitting: Pulmonary Disease

## 2013-05-28 NOTE — Telephone Encounter (Signed)
Notes Recorded by Melvenia Needles, NP on 05/22/2013 at 10:55 AM Neg perfusion test  Cont w/ ov recs  Spoke with pt and notified of results per Rexene Edison, NP. Pt verbalized understanding and denied any questions.

## 2013-05-28 NOTE — Telephone Encounter (Signed)
lmtcb x1 I believe this is in regards to his VQ scan results.

## 2013-05-28 NOTE — Progress Notes (Signed)
Quick Note:  Pt aware ______ 

## 2013-05-28 NOTE — Telephone Encounter (Signed)
Pt returning call

## 2013-05-29 ENCOUNTER — Ambulatory Visit: Payer: Medicare Other | Admitting: Pulmonary Disease

## 2013-05-30 LAB — CULTURE, BLOOD (ROUTINE X 2)
CULTURE: NO GROWTH
Culture: NO GROWTH

## 2013-06-01 ENCOUNTER — Encounter: Payer: Self-pay | Admitting: Pulmonary Disease

## 2013-06-01 ENCOUNTER — Ambulatory Visit (INDEPENDENT_AMBULATORY_CARE_PROVIDER_SITE_OTHER): Payer: Medicare Other | Admitting: Pulmonary Disease

## 2013-06-01 VITALS — BP 160/112 | HR 85 | Temp 96.8°F | Wt 138.8 lb

## 2013-06-01 DIAGNOSIS — R918 Other nonspecific abnormal finding of lung field: Secondary | ICD-10-CM

## 2013-06-01 DIAGNOSIS — R0600 Dyspnea, unspecified: Secondary | ICD-10-CM

## 2013-06-01 DIAGNOSIS — R0989 Other specified symptoms and signs involving the circulatory and respiratory systems: Secondary | ICD-10-CM

## 2013-06-01 DIAGNOSIS — R0609 Other forms of dyspnea: Secondary | ICD-10-CM

## 2013-06-01 MED ORDER — PREDNISONE 10 MG PO TABS: 20.0000 mg | ORAL_TABLET | Freq: Every day | ORAL | Status: DC

## 2013-06-01 NOTE — Patient Instructions (Signed)
Prednisone 10 mg tabs  Take 2 tabs daily with food  Trial of spiriva respimat - call me for Rx if this works If not, we will go to [facility]bul[facility] with meds

## 2013-06-01 NOTE — Progress Notes (Signed)
   Subjective:    Patient ID: [name], male    DOB: June 26, 1945, 68 y.o.   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 These resolved with prednisone ? inflammatory   07/07/2011 Spirometry >> mild airway obstruction, FEv1 93%   Tapered off pred - 7/13  Pt stopped the symbicort/albuterol bc he did not notice a difference       Post hosp OV - Adm 2/25-2/27/15 with increased dyspnea.   [place]ravelled [place] of head congestion w/ clear drainage, PND, prod cough, chest tightness, increased SOB x1.5weeks.  Denies f/c/s, wheezing, hemoptysis, nausea, vomiting.  Has been exposed to paint fumes within the home, doing some repairs in home.  His recent CT scan (04/02/13)of the chest, abdomen and pelvis showed no significant evidence for disease progression.  Echo - nml LV fn, trivial pericardial effusion  over last year , breathing, DOE seem to be getting worse. Wears out easily with fatigue and dyspnea.  No chest pain, orthopnea, edema , fever, hemoptysis.  Wt is stable w/ no n/v/d.   PFTs 3/2 /15 nml spirometry, FEV 1 96%, DLCO 33% VQ neg Transfusion helped Amox helps  It is possible that there are multiple factors contributing inc anemia, mild (by previous PFTs) obstructive lung disease and other as yet undefined contributors. He has a chronically mildly low HCO3 on chemistry panels. His ABG seems to show chronic resp alkalosis with compensation. Could there be a component of anxiety/hyperventilation syndrome?    Past Medical History  Diagnosis Date  . Hypertension   . Arthritis RIGHT SHOULDER  . Immature cataract BILATERAL  . Non-small cell lung cancer DX SEPT 2010  W/ CHEMORADIATION AT THAT TIME -- NOW  W/ METS--  CURRENTLY ON  MAINTENANCE CHEMO TX  EVERY 30 DAYS    ONCOLOGIST- [facility]  . Acute meniscal tear of knee RIGHT KNEE  . BPH (benign prostatic hypertrophy)      Review of Systems neg for any significant sore throat, dysphagia, itching, sneezing, nasal congestion or excess/ purulent secretions, fever, chills, sweats, unintended wt loss, pleuritic or exertional cp, hempoptysis, orthopnea pnd or change in chronic leg swelling. Also denies presyncope, palpitations, heartburn, abdominal pain, nausea, vomiting, diarrhea or change in bowel or urinary habits, dysuria,hematuria, rash, arthralgias, visual complaints, headache, numbness weakness or ataxia.     Objective:   Physical Exam Gen. Pleasant, well-nourished, in no distress ENT - no lesions, no post nasal drip Neck: No JVD, no thyromegaly, no carotid bruits Lungs: no use of accessory muscles, no dullness to percussion, decreased without rales or rhonchi  Cardiovascular: Rhythm regular, heart sounds  normal, no murmurs or gallops, no peripheral edema Musculoskeletal: No deformities, no cyanosis or clubbing         Assessment & Plan:

## 2013-06-04 ENCOUNTER — Encounter (HOSPITAL_COMMUNITY): Payer: Self-pay

## 2013-06-04 ENCOUNTER — Ambulatory Visit (HOSPITAL_COMMUNITY)
Admission: RE | Admit: 2013-06-04 | Discharge: 2013-06-04 | Disposition: A | Discharge status: Home / Self Care | Payer: Medicare Other | Source: Ambulatory Visit | Attending: Internal Medicine | Admitting: Internal Medicine

## 2013-06-04 DIAGNOSIS — R599 Enlarged lymph nodes, unspecified: Secondary | ICD-10-CM | POA: Insufficient documentation

## 2013-06-04 DIAGNOSIS — Z923 Personal history of irradiation: Secondary | ICD-10-CM | POA: Insufficient documentation

## 2013-06-04 DIAGNOSIS — C3490 Malignant neoplasm of unspecified part of unspecified bronchus or lung: Secondary | ICD-10-CM | POA: Insufficient documentation

## 2013-06-04 DIAGNOSIS — C3410 Malignant neoplasm of upper lobe, unspecified bronchus or lung: Secondary | ICD-10-CM

## 2013-06-04 DIAGNOSIS — Z9221 Personal history of antineoplastic chemotherapy: Secondary | ICD-10-CM | POA: Insufficient documentation

## 2013-06-04 MED ORDER — IOHEXOL 300 MG/ML  SOLN
80.0000 mL | Freq: Once | INTRAMUSCULAR | Status: AC | PRN
  Administered 2013-06-04: 80 mL via INTRAVENOUS

## 2013-06-04 NOTE — Assessment & Plan Note (Addendum)
It is possible that there are multiple factors contributing inc anemia, mild (by previous PFTs) obstructive lung disease and other as yet undefined contributors. He has a chronically mildly low HCO3 on chemistry panels. His ABG seems to show chronic resp alkalosis with compensation.  More likely ,this is related to sideeffects of long term chemotherapy?  Empiric trial of prednisone Prednisone 10 mg tabs  Take 2 tabs daily with food  Trial of spiriva respimat - call me for Rx if this works If not, we will go to [facility]bul[facility] with meds

## 2013-06-04 NOTE — Assessment & Plan Note (Signed)
resolved 

## 2013-06-05 ENCOUNTER — Other Ambulatory Visit: Payer: Self-pay

## 2013-06-05 ENCOUNTER — Telehealth: Payer: Self-pay | Admitting: Internal Medicine

## 2013-06-05 ENCOUNTER — Other Ambulatory Visit: Payer: Self-pay | Admitting: *Deleted

## 2013-06-05 ENCOUNTER — Ambulatory Visit (HOSPITAL_COMMUNITY)
Admission: RE | Admit: 2013-06-05 | Discharge: 2013-06-05 | Disposition: A | Discharge status: Home / Self Care | Payer: Medicare Other | Source: Ambulatory Visit | Attending: Internal Medicine | Admitting: Internal Medicine

## 2013-06-05 ENCOUNTER — Ambulatory Visit (HOSPITAL_BASED_OUTPATIENT_CLINIC_OR_DEPARTMENT_OTHER): Payer: Medicare Other | Admitting: Internal Medicine

## 2013-06-05 ENCOUNTER — Other Ambulatory Visit (HOSPITAL_COMMUNITY): Payer: Self-pay | Admitting: Internal Medicine

## 2013-06-05 ENCOUNTER — Encounter: Payer: Self-pay | Admitting: Internal Medicine

## 2013-06-05 ENCOUNTER — Ambulatory Visit (HOSPITAL_BASED_OUTPATIENT_CLINIC_OR_DEPARTMENT_OTHER): Payer: Medicare Other

## 2013-06-05 ENCOUNTER — Other Ambulatory Visit (HOSPITAL_BASED_OUTPATIENT_CLINIC_OR_DEPARTMENT_OTHER): Payer: Medicare Other

## 2013-06-05 VITALS — BP 172/90 | HR 69 | Temp 97.6°F | Resp 16

## 2013-06-05 VITALS — BP 168/80 | HR 70 | Temp 97.0°F | Resp 18 | Ht 68.0 in | Wt 135.4 lb

## 2013-06-05 DIAGNOSIS — D6481 Anemia due to antineoplastic chemotherapy: Secondary | ICD-10-CM

## 2013-06-05 DIAGNOSIS — T451X5A Adverse effect of antineoplastic and immunosuppressive drugs, initial encounter: Secondary | ICD-10-CM

## 2013-06-05 DIAGNOSIS — C3491 Malignant neoplasm of unspecified part of right bronchus or lung: Secondary | ICD-10-CM

## 2013-06-05 DIAGNOSIS — D649 Anemia, unspecified: Secondary | ICD-10-CM

## 2013-06-05 DIAGNOSIS — C3410 Malignant neoplasm of upper lobe, unspecified bronchus or lung: Secondary | ICD-10-CM

## 2013-06-05 DIAGNOSIS — D61818 Other pancytopenia: Secondary | ICD-10-CM

## 2013-06-05 LAB — COMPREHENSIVE METABOLIC PANEL (CC13)
ALK PHOS: 93 U/L (ref 40–150)
ALT: 30 U/L (ref 0–55)
AST: 35 U/L — ABNORMAL HIGH (ref 5–34)
Albumin: 3.1 g/dL — ABNORMAL LOW (ref 3.5–5.0)
Anion Gap: 9 mEq/L (ref 3–11)
BILIRUBIN TOTAL: 0.31 mg/dL (ref 0.20–1.20)
BUN: 30.2 mg/dL — ABNORMAL HIGH (ref 7.0–26.0)
CO2: 20 mEq/L — ABNORMAL LOW (ref 22–29)
Calcium: 9.3 mg/dL (ref 8.4–10.4)
Chloride: 112 mEq/L — ABNORMAL HIGH (ref 98–109)
Creatinine: 1.8 mg/dL — ABNORMAL HIGH (ref 0.7–1.3)
Glucose: 102 mg/dl (ref 70–140)
Potassium: 4 mEq/L (ref 3.5–5.1)
SODIUM: 141 meq/L (ref 136–145)
TOTAL PROTEIN: 6 g/dL — AB (ref 6.4–8.3)

## 2013-06-05 LAB — CBC WITH DIFFERENTIAL/PLATELET
BASO%: 0.1 % (ref 0.0–2.0)
Basophils Absolute: 0 10*3/uL (ref 0.0–0.1)
EOS%: 0.6 % (ref 0.0–7.0)
Eosinophils Absolute: 0.1 10*3/uL (ref 0.0–0.5)
HCT: 25.5 % — ABNORMAL LOW (ref 38.4–49.9)
HGB: 8.3 g/dL — ABNORMAL LOW (ref 13.0–17.1)
LYMPH#: 1.7 10*3/uL (ref 0.9–3.3)
LYMPH%: 20 % (ref 14.0–49.0)
MCH: 35.2 pg — AB (ref 27.2–33.4)
MCHC: 32.5 g/dL (ref 32.0–36.0)
MCV: 108.1 fL — AB (ref 79.3–98.0)
MONO#: 0.9 10*3/uL (ref 0.1–0.9)
MONO%: 10.2 % (ref 0.0–14.0)
NEUT#: 5.9 10*3/uL (ref 1.5–6.5)
NEUT%: 69.1 % (ref 39.0–75.0)
Platelets: 147 10*3/uL (ref 140–400)
RBC: 2.36 10*6/uL — AB (ref 4.20–5.82)
RDW: 20.2 % — AB (ref 11.0–14.6)
WBC: 8.6 10*3/uL (ref 4.0–10.3)

## 2013-06-05 LAB — PREPARE RBC (CROSSMATCH)

## 2013-06-05 LAB — UA PROTEIN, DIPSTICK - CHCC: PROTEIN: 100 mg/dL

## 2013-06-05 LAB — TECHNOLOGIST REVIEW

## 2013-06-05 LAB — HOLD TUBE, BLOOD BANK

## 2013-06-05 MED ORDER — ACETAMINOPHEN 325 MG PO TABS
ORAL_TABLET | ORAL | Status: AC
  Filled 2013-06-05: qty 2

## 2013-06-05 MED ORDER — DIPHENHYDRAMINE HCL 25 MG PO CAPS
ORAL_CAPSULE | ORAL | Status: AC
  Filled 2013-06-05: qty 1

## 2013-06-05 MED ORDER — SODIUM CHLORIDE 0.9 % IV SOLN
250.0000 mL | Freq: Once | INTRAVENOUS | Status: AC
  Administered 2013-06-05: 250 mL via INTRAVENOUS

## 2013-06-05 MED ORDER — HEPARIN SOD (PORK) LOCK FLUSH 100 UNIT/ML IV SOLN
500.0000 [IU] | Freq: Every day | INTRAVENOUS | Status: AC | PRN
  Administered 2013-06-05: 500 [IU]
  Filled 2013-06-05: qty 5

## 2013-06-05 MED ORDER — SODIUM CHLORIDE 0.9 % IJ SOLN
10.0000 mL | INTRAMUSCULAR | Status: AC | PRN
  Administered 2013-06-05: 10 mL
  Filled 2013-06-05: qty 10

## 2013-06-05 MED ORDER — ACETAMINOPHEN 325 MG PO TABS
650.0000 mg | ORAL_TABLET | Freq: Once | ORAL | Status: AC
  Administered 2013-06-05: 650 mg via ORAL

## 2013-06-05 MED ORDER — DIPHENHYDRAMINE HCL 25 MG PO CAPS
25.0000 mg | ORAL_CAPSULE | Freq: Once | ORAL | Status: AC
  Administered 2013-06-05: 25 mg via ORAL

## 2013-06-05 NOTE — Patient Instructions (Signed)
Blood Transfusion Information WHAT IS A BLOOD TRANSFUSION? A transfusion is the replacement of blood or some of its parts. Blood is made up of multiple cells which provide different functions.  Red blood cells carry oxygen and are used for blood loss replacement.  White blood cells fight against infection.  Platelets control bleeding.  Plasma helps clot blood.  Other blood products are available for specialized needs, such as hemophilia or other clotting disorders. BEFORE THE TRANSFUSION  Who gives blood for transfusions?   You may be able to donate blood to be used at a later date on yourself (autologous donation).  Relatives can be asked to donate blood. This is generally not any safer than if you have received blood from a stranger. The same precautions are taken to ensure safety when a relative's blood is donated.  Healthy volunteers who are fully evaluated to make sure their blood is safe. This is blood bank blood. Transfusion therapy is the safest it has ever been in the practice of medicine. Before blood is taken from a donor, a complete history is taken to make sure that person has no history of diseases nor engages in risky social behavior (examples are intravenous drug use or sexual activity with multiple partners). The donor's travel history is screened to minimize risk of transmitting infections, such as malaria. The donated blood is tested for signs of infectious diseases, such as HIV and hepatitis. The blood is then tested to be sure it is compatible with you in order to minimize the chance of a transfusion reaction. If you or a relative donates blood, this is often done in anticipation of surgery and is not appropriate for emergency situations. It takes many days to process the donated blood. RISKS AND COMPLICATIONS Although transfusion therapy is very safe and saves many lives, the main dangers of transfusion include:   Getting an infectious disease.  Developing a  transfusion reaction. This is an allergic reaction to something in the blood you were given. Every precaution is taken to prevent this. The decision to have a blood transfusion has been considered carefully by your caregiver before blood is given. Blood is not given unless the benefits outweigh the risks. AFTER THE TRANSFUSION  Right after receiving a blood transfusion, you will usually feel much better and more energetic. This is especially true if your red blood cells have gotten low (anemic). The transfusion raises the level of the red blood cells which carry oxygen, and this usually causes an energy increase.  The nurse administering the transfusion will monitor you carefully for complications. HOME CARE INSTRUCTIONS  No special instructions are needed after a transfusion. You may find your energy is better. Speak with your caregiver about any limitations on activity for underlying diseases you may have. SEEK MEDICAL CARE IF:   Your condition is not improving after your transfusion.  You develop redness or irritation at the intravenous (IV) site. SEEK IMMEDIATE MEDICAL CARE IF:  Any of the following symptoms occur over the next 12 hours:  Shaking chills.  You have a temperature by mouth above 102 F (38.9 C), not controlled by medicine.  Chest, back, or muscle pain.  People around you feel you are not acting correctly or are confused.  Shortness of breath or difficulty breathing.  Dizziness and fainting.  You get a rash or develop hives.  You have a decrease in urine output.  Your urine turns a dark color or changes to pink, red, or brown. Any of the following   symptoms occur over the next 10 days:  You have a temperature by mouth above 102 F (38.9 C), not controlled by medicine.  Shortness of breath.  Weakness after normal activity.  The white part of the eye turns yellow (jaundice).  You have a decrease in the amount of urine or are urinating less often.  Your  urine turns a dark color or changes to pink, red, or brown. Document Released: 03/12/2000 Document Revised: 06/07/2011 Document Reviewed: 10/30/2007 Exit[employer] Patient Information 2014 [employer].  

## 2013-06-05 NOTE — Telephone Encounter (Signed)
Gave pt appt for MAy with oral contrast for CT today

## 2013-06-05 NOTE — Telephone Encounter (Signed)
trashed old mailing and mailed pt updated appt in May.Marland KitchenMarland Kitchen

## 2013-06-05 NOTE — Telephone Encounter (Signed)
lvm for pt regarding to May appt....mailed pt appt sched avs and letter

## 2013-06-05 NOTE — Progress Notes (Signed)
1225-Pt complaining of feeling lightheaded and "slight stabbing left chest pain"  "2" out of 10 on pain scale.  Rollene Rotunda PA notified and order received for EKG now.  1300-EKG results showed to Dr. [name].  No new orders at this time.  Pt.'s pain is unchanged from prior assessment.  MD aware of pt.'s BP.

## 2013-06-05 NOTE — Progress Notes (Signed)
[place] [phone]   Fax:([phone]) [place], MD [address] [place] [address]  DIAGNOSIS AND DIAGNOSIS: Metastatic non-small cell lung cancer adenocarcinoma diagnosed in September 2010   PRIOR THERAPY:  #1 status post 6 cycles of systemic chemotherapy with carboplatin, Alimta and Avastin given every 3 weeks last dose given 04/23/2009 with disease stabilization.  #2 status post palliative radiotherapy to the mediastinum under the care of Dr. Pablo Ledger. The patient received a total dose of 3000 cGY and 12 fractions completed 01/21/2009.  #3 Maintenance chemotherapy with Alimta at 500 mg per meter squared and Avastin at 15 mg per kilogram given every 3 weeks status post 35 cycles.  #4 Maintenance chemotherapy with Alimta 500 mg/M2 and Avastin 15 mg/kg every 4 weeks,status post 17 cycles, discontinued secondary to disease progression.  #5 Systemic chemotherapy with carboplatin for AUC of 5, Alimta 500 mg/M2 and Avastin 15 mg/kg every 3 weeks, status post 3 cycles, last cycle was given 12/28/2012. Carboplatin was discontinued starting cycle #2 secondary to hypersensitivity reaction. #6  Maintenance chemotherapy again with Alimta 500 mg/M2 and Avastin 15 mg/kg every 3 weeks, first cycle 01/23/2013. Status post 6 cycles.   CURRENT THERAPY: observation  CHEMOTHERAPY INTENT: palliative/maintenance.  CURRENT # OF CHEMOTHERAPY CYCLES: 0 CURRENT ANTIEMETICS: Compazine when necessary but clearly used.  CURRENT SMOKING STATUS: current nonsmoker  ORAL CHEMOTHERAPY AND CONSENT: None.  CURRENT BISPHOSPHONATES USE: None.  PAIN MANAGEMENT: No Pain  NARCOTICS INDUCED CONSTIPATION: N/A  LIVING WILL AND CODE STATUS: NCB   INTERVAL HISTORY:  [name] 68 y.o. male returns to the clinic today for follow up visit accompanied by his wife. The patient was recently admitted to [facility] complaining of  worsening dyspnea as well as fatigue and weakness. He also has recurrent epistaxis and anemia. His hemoglobin was down to 7.5 G./DL and the patient received 1 unit of PRBCs transfusion. He has 2-D echo during his hospitalization that showed normal ejection fraction of 55-60%. He has transient increase in his serum creatinine which improved with hydration. He was treated with steroids and antibiotics during his hospitalization. He still complaining of increasing fatigue and weakness. He is worried about continuing systemic chemotherapy with the same regimen. He had repeat CT scan of the chest, abdomen and pelvis performed recently and he is here for evaluation and discussion of his scan results.  MEDICAL HISTORY: Past Medical History  Diagnosis Date  . Hypertension   . Arthritis RIGHT SHOULDER  . Immature cataract BILATERAL  . Non-small cell lung cancer DX SEPT 2010  W/ CHEMORADIATION AT THAT TIME -- NOW  W/ METS--  CURRENTLY ON MAINTENANCE CHEMO TX  EVERY 30 DAYS    ONCOLOGIST- [facility]  . Acute meniscal tear of knee RIGHT KNEE  . BPH (benign prostatic hypertrophy)     ALLERGIES:  is allergic to carboplatin; percocet; and zolpidem tartrate.  MEDICATIONS:  Current Outpatient Prescriptions  Medication Sig Dispense Refill  . amLODipine (NORVASC) 2.5 MG tablet Take 2.5 mg by mouth every morning.       . Ascorbic Acid (VITAMIN C) 1000 MG tablet Take 1,000 mg by mouth daily.      Marland Kitchen atenolol (TENORMIN) 100 MG tablet Take 100 mg by mouth every morning.       . B Complex-Biotin-FA (B-COMPLEX PO) Take 1 tablet by mouth daily. 1500mg  daily      . doxazosin (CARDURA) 2 MG tablet  Take 2 mg by mouth at bedtime.       . FeFum-FePoly-FA-B Cmp-C-Biot (FOLIVANE-PLUS) CAPS Take 1 capsule by mouth daily.  90 capsule  3  . fexofenadine (ALLEGRA) 180 MG tablet Take 180 mg by mouth daily.      . folic acid (FOLVITE) 1 MG tablet Take 1 tablet (1 mg total) by mouth daily.  90 tablet  1  . HYDROcodone-homatropine  (HYDROMET) 5-1.5 MG/5ML syrup Take 5 mLs by mouth every 6 (six) hours as needed for cough.  240 mL  0  . Multiple Vitamin (MULTIVITAMIN) tablet Take 1 tablet by mouth daily.       Marland Kitchen omeprazole (PRILOSEC) 40 MG capsule Take 1 capsule (40 mg total) by mouth every evening.  90 capsule  3  . predniSONE (DELTASONE) 10 MG tablet Take 2 tablets (20 mg total) by mouth daily with breakfast.  60 tablet  0  . prochlorperazine (COMPAZINE) 10 MG tablet Take 1 tablet (10 mg total) by mouth every 6 (six) hours as needed.  30 tablet  0  . Sennosides-Docusate Sodium (SENOKOT S PO) Take 2 tablets by mouth daily. 2 tabs daily      . tamsulosin (FLOMAX) 0.4 MG CAPS capsule Take 1 capsule by mouth daily.      Marland Kitchen tiotropium (SPIRIVA) 18 MCG inhalation capsule Place 18 mcg into inhaler and inhale daily.       No current facility-administered medications for this visit.    SURGICAL HISTORY:  Past Surgical History  Procedure Laterality Date  . 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 Rotator cuff repair  2008    LEFT SHOULDER  . Bilateral ear drum surgery  1960'S  . Orif left ring finger  and revascularization of radial side  02-16-2007    DEGLOVING INJURY  . Knee arthroscopy  12/21/2011    Procedure: ARTHROSCOPY KNEE;  Surgeon: Tobi Bastos, MD;  Location: [facility];  Service: Orthopedics;  Laterality: Right;  WITH MEDIAL MENISECTOMY    REVIEW OF SYSTEMS:  Constitutional: positive for fatigue Eyes: negative Ears, nose, mouth, throat, and face: negative Respiratory: negative Cardiovascular: negative Gastrointestinal: negative Genitourinary:negative Integument/breast: negative Hematologic/lymphatic: negative Musculoskeletal:negative Neurological: negative Behavioral/Psych: negative Endocrine: negative Allergic/Immunologic: negative   PHYSICAL EXAMINATION: General appearance: alert, cooperative, fatigued and no  distress Head: Normocephalic, without obvious abnormality, atraumatic Neck: no adenopathy, no JVD, supple, symmetrical, trachea midline and thyroid not enlarged, symmetric, no tenderness/mass/nodules Lymph nodes: Cervical, supraclavicular, and axillary nodes normal. Resp: clear to auscultation bilaterally Back: symmetric, no curvature. ROM normal. No CVA tenderness. Cardio: regular rate and rhythm, S1, S2 normal, no murmur, click, rub or gallop GI: soft, non-tender; bowel sounds normal; no masses,  no organomegaly Extremities: extremities normal, atraumatic, no cyanosis or edema Neurologic: Alert and oriented X 3, normal strength and tone. Normal symmetric reflexes. Normal coordination and gait  ECOG PERFORMANCE STATUS: 1 - Symptomatic but completely ambulatory  Blood pressure 168/80, pulse 70, temperature 97 F (36.1 C), temperature source Oral, resp. rate 18, height 5\' 8"  (1.727 m), weight 135 lb 6.4 oz (61.417 kg), SpO2 100.00%.  LABORATORY DATA: Lab Results  Component Value Date   WBC 8.6 06/05/2013   HGB 8.3* 06/05/2013   HCT 25.5* 06/05/2013   MCV 108.1* 06/05/2013   PLT 147 06/05/2013      Chemistry      Component Value Date/Time   NA 140 05/25/2013 0445  NA 141 05/15/2013 0822   K 4.2 05/25/2013 0445   K 4.2 05/15/2013 0822   CL 109 05/25/2013 0445   CL 109* 08/29/2012 0856   CO2 19 05/25/2013 0445   CO2 20* 05/15/2013 0822   BUN 30* 05/25/2013 0445   BUN 27.4* 05/15/2013 0822   CREATININE 1.95* 05/25/2013 0445   CREATININE 1.5* 05/15/2013 0822      Component Value Date/Time   CALCIUM 8.7 05/25/2013 0445   CALCIUM 9.6 05/15/2013 0822   ALKPHOS 84 05/15/2013 0822   ALKPHOS 55 11/18/2011 0858   AST 28 05/15/2013 0822   AST 25 11/18/2011 0858   ALT 24 05/15/2013 0822   ALT 17 11/18/2011 0858   BILITOT 0.41 05/15/2013 0822   BILITOT 0.5 11/18/2011 0858       RADIOGRAPHIC STUDIES: Dg Chest 2 View  05/23/2013   CLINICAL DATA:  Shortness of breath and weakness.  EXAM: CHEST  2 VIEW   COMPARISON:  Chest radiograph performed 05/21/2013  FINDINGS: The lungs are well-aerated and appear grossly clear. There is no evidence of focal opacification, pleural effusion or pneumothorax.  The heart is normal in size; the mediastinal contour is within normal limits. No acute osseous abnormalities are seen. A right-sided chest port is noted ending about the distal SVC.  IMPRESSION: No acute cardiopulmonary process seen.   Electronically Signed   By: Garald Balding M.D.   On: 05/23/2013 23:41   Dg Chest 2 View  05/21/2013   CLINICAL DATA:  Dyspnea.  Elevated D-dimer.  EXAM: CHEST  2 VIEW  COMPARISON:  Two-view chest x-ray 05/07/2013.  FINDINGS: The heart size is normal. Emphysematous changes are again noted. No focal airspace disease is present. A right IJ Port-A-Cath is stable.  IMPRESSION: 1. Emphysema. 2. No acute cardiopulmonary disease.   Electronically Signed   By: Lawrence Santiago M.D.   On: 05/21/2013 15:20   Dg Chest 2 View  05/07/2013   CLINICAL DATA:  Cough, shortness of breath  EXAM: CHEST  2 VIEW  COMPARISON:  CT CHEST W/O CM dated 04/02/2013; DG CHEST 2 VIEW dated 05/18/2011  FINDINGS: Right-sided porta catheter is appreciated with tip projecting in the region of the distal superior vena cava. Cardiac silhouette within normal limits. Atherosclerotic calcifications identified within the aorta. There is mild flattening of the hemidiaphragms and increased lucency in the apical regions of the lungs bilaterally. No focal regions of consolidation or focal infiltrates are appreciated. The osseous structures are unremarkable.  IMPRESSION: Findings consistent with COPD without evidence of acute cardiopulmonary disease.   Electronically Signed   By: Margaree Mackintosh M.D.   On: 05/07/2013 17:45   Ct Chest W Contrast  06/04/2013   CLINICAL DATA:  History of metastatic non small cell lung cancer diagnosed in 2010. History of chemotherapy. Radiation therapy complete.  EXAM: CT CHEST, ABDOMEN, AND PELVIS WITH  CONTRAST  TECHNIQUE: Multidetector CT imaging of the chest, abdomen and pelvis was performed following the standard protocol during bolus administration of intravenous contrast.  CONTRAST:  49mL OMNIPAQUE IOHEXOL 300 MG/ML  SOLN  COMPARISON:  DG CHEST 2 VIEW dated 05/23/2013; CT CHEST W/O CM dated 04/02/2013; CT CHEST W/CM dated 12/02/2011; CT CHEST W/CM dated 06/25/2011; CT CHEST W/CM dated 09/12/2009  FINDINGS: CT CHEST FINDINGS  Right anterior chest wall Port-A-Cath is present with tip terminating in the superior vena cava. No enlarged axillary, mediastinal or hilar lymphadenopathy. Prominent sub centimeter mediastinal lymph nodes are unchanged from prior examination. Reference right paratracheal lymph node  measured 0.8 mm (image 23; series 2), previously the same. Normal heart size. Dense coronary arterial calcifications. Normal caliber aorta and main pulmonary artery.  Re- demonstrated scarring and postradiation change within the medial aspect of the right upper lobe. This is unchanged in appearance from prior examination.Interval development of peripheral consolidation and ground-glass opacity measuring up to 2 cm in the lingula and adjacent lower lobe. Stable 5 mm lingular nodule. Interval development of ground-glass and irregular linear opacities within the posterior dependent aspect of the bilateral lower lobes. New 3 mm subpleural nodule right upper lobe (image 19; series 4). Two new areas irregular ground-glass opacity within the right middle lobe with the larger area measuring 1.5 cm (image 35; series 4). Previously described irregular ground-glass opacity measuring 0.5 cm within the right middle lobe is stable (image 41; series 4). Suggestion of new 7 mm focus of ground-glass opacity within the right lower lobe (image 45; series 4). No pleural effusion or pneumothorax.  CT ABDOMEN AND PELVIS FINDINGS  Liver is normal in size and contour without focal hepatic lesion identified. Portal vein is patent.  Gallbladder is unremarkable. No intrahepatic or extrahepatic biliary ductal dilatation. The spleen, pancreas and bilateral adrenal glands are unremarkable. Kidneys enhance symmetrically with contrast. Unchanged nonspecific bilateral perinephric fat stranding. Unchanged simple cyst within the interpolar region of left kidney. Focal atrophy superior pole left kidney.  Scattered calcified atherosclerotic plaque of the abdominal aorta including at the origin of the celiac and superior mesenteric vessels. Unchanged 1 cm aortocaval lymph node (image 88; series 2) and 0.9 cm aortocaval lymph node along the posterior aspect (image 77; series 2). Urinary bladder is unremarkable. Prostate contains central dystrophic calcifications.  Oral contrast material is demonstrated throughout the bowel. No evidence for abnormal bowel wall thickening or bowel obstruction. No free fluid or free intraperitoneal air  No aggressive or acute appearing osseous lesions.  IMPRESSION: 1. Interval development of additional areas of ground-glass nodularity within the right middle and lower lobes. Additionally there is new peripheral ground-glass and consolidative opacity within the lingula and left lower lobe. While these are likely infectious/ inflammatory or posttreatment in etiology, progression of disease is not excluded. 2. Stable post therapeutic changes within the medial right upper lobe. 3. Unchanged mild retroperitoneal lymphadenopathy.   Electronically Signed   By: Lovey Newcomer M.D.   On: 06/04/2013 09:09   Nm Pulmonary Per & Vent  05/21/2013   CLINICAL DATA:  Shortness of breath.  History of lung cancer.  EXAM: NUCLEAR MEDICINE VENTILATION - PERFUSION LUNG SCAN  TECHNIQUE: Ventilation images were obtained in multiple projections using inhaled aerosol technetium 99 M DTPA. Perfusion images were obtained in multiple projections after intravenous injection of Tc-58m MAA.  COMPARISON:  PA and lateral chest 05/21/2013 and 1319 hr.   RADIOPHARMACEUTICALS:  40 mCi Tc-65m DTPA aerosol and 6 mCi Tc-21m MAA  FINDINGS: Ventilation: No focal ventilation defect.  Perfusion: No wedge shaped peripheral perfusion defects to suggest acute pulmonary embolism.  IMPRESSION: Negative for pulmonary embolus.   Electronically Signed   By: Inge Rise M.D.   On: 05/21/2013 14:16   ASSESSMENT AND PLAN: This is a very pleasant 68 years old white male with metastatic non-small cell lung cancer, adenocarcinoma currently undergoing systemic chemotherapy with Alimta and Avastin status post 6 cycles. The patient tolerated the last cycle of his treatment fairly well except for the increasing fatigue and weakness as well as pancytopenia. His recent scan showed no significant evidence for disease progression but continues  in the ground glass opacity at the lung bases questionable to be inflammatory in origin. I discussed the scan results and give the patient a copy of his report. I gave him several options for management of his condition including continuing systemic chemotherapy with the same regimen with Alimta and Avastin but reducing the dose, versus observation versus treatment with oral Tarceva. The patient is complaining of increasing fatigue and weakness and he would like to take a break of chemotherapy for the next few months. I will arrange for him to come back for follow up visit in 2 months with repeat CT scan of the chest, abdomen and pelvis for restaging of his disease. For the chemotherapy-induced anemia, I will arrange for the patient to receive 2 units of PRBCs transfusion today.  He was advised to call immediately if he has any concerning symptoms in the interval. The patient voices understanding of current disease status and treatment options and is in agreement with the current care plan.  All questions were answered. The patient knows to call the clinic with any problems, questions or concerns. We can certainly see the patient much  sooner if necessary. I spent 20 minutes of face-to-face counseling with the patient and his wife out of the total visit time 30 minutes. Disclaimer: This note was dictated with voice recognition software. Similar sounding words can inadvertently be transcribed and may not be corrected upon review.

## 2013-06-06 ENCOUNTER — Telehealth: Payer: Self-pay | Admitting: Pulmonary Disease

## 2013-06-06 DIAGNOSIS — C3410 Malignant neoplasm of upper lobe, unspecified bronchus or lung: Secondary | ICD-10-CM

## 2013-06-06 LAB — TYPE AND SCREEN
ABO/RH(D): O POS
Antibody Screen: NEGATIVE
UNIT DIVISION: 0
Unit division: 0

## 2013-06-06 MED ORDER — TIOTROPIUM BROMIDE MONOHYDRATE 18 MCG IN CAPS: 18.0000 ug | ORAL_CAPSULE | Freq: Every day | RESPIRATORY_TRACT | Status: DC

## 2013-06-06 NOTE — Telephone Encounter (Signed)
Called spoke with pt. Aware RX has been sent. Nothing further needed

## 2013-06-18 ENCOUNTER — Telehealth: Payer: Self-pay | Admitting: Pulmonary Disease

## 2013-06-18 MED ORDER — TIOTROPIUM BROMIDE MONOHYDRATE 2.5 MCG/ACT IN AERS: 2.0000 | INHALATION_SPRAY | Freq: Every day | RESPIRATORY_TRACT | Status: DC

## 2013-06-18 NOTE — Telephone Encounter (Signed)
LMTCB x 1 

## 2013-06-19 NOTE — Telephone Encounter (Signed)
OK with me. Use as needed only

## 2013-06-19 NOTE — Telephone Encounter (Signed)
Called and made pt aware of recs. He voiced his understanding and needed nothing further

## 2013-06-19 NOTE — Telephone Encounter (Signed)
Spoke with patient-states that RA gave him a sample of Spiriva Respimat but Rx was sent to his mail in pharmacy as original Spiriva. Pt is aware of how to use the Spiriva inhaler (egg shaped) he received as his mother used this as well.    Pt states that his breathing has gotten better since having his transfusion and wonders is RA will allow him to come off Spiriva as he feels he may not need it at all and does not want meds he doesn't need.   RA please advise. Thanks.

## 2013-06-22 ENCOUNTER — Telehealth: Payer: Self-pay | Admitting: Pulmonary Disease

## 2013-06-22 NOTE — Telephone Encounter (Signed)
Called and spoke with pt. He reports he has noticed since using the spiriva PRN. He wants to go back on the spiriva resp. I advised I will leave samples for him to pick up. He voiced his understanding and needed nothing further.

## 2013-06-26 ENCOUNTER — Ambulatory Visit: Payer: Medicare Other

## 2013-06-26 ENCOUNTER — Other Ambulatory Visit: Payer: Medicare Other

## 2013-07-02 ENCOUNTER — Encounter: Payer: Self-pay | Admitting: Pulmonary Disease

## 2013-07-02 ENCOUNTER — Ambulatory Visit: Payer: Medicare Other | Admitting: Pulmonary Disease

## 2013-07-02 VITALS — BP 124/76 | HR 68 | Temp 97.7°F | Ht 68.0 in | Wt 140.4 lb

## 2013-07-02 DIAGNOSIS — C3410 Malignant neoplasm of upper lobe, unspecified bronchus or lung: Secondary | ICD-10-CM

## 2013-07-02 DIAGNOSIS — R0600 Dyspnea, unspecified: Secondary | ICD-10-CM

## 2013-07-02 NOTE — Patient Instructions (Signed)
OK to stop prednisone Stay on spiriva Call if worse

## 2013-07-02 NOTE — Progress Notes (Signed)
   Subjective:    Patient ID: [name], male    DOB: [birth year], 68 y.o.   MRN: [id number]  HPI  74M, ex smoker with metastatic adenoCA diagnosed in sep '10 s/p chemo & RT to mediastinum last 10/10  He is on maintenance chemo alimta & avastin since  CT chest 2/13 showed Multiple new ground-glass nodules in the lower lobes - not seen in 11/12. These resolved with prednisone ? inflammatory  07/07/2011 Spirometry >> mild airway obstruction, FEv1 93%  Tapered off pred - 7/13  Pt stopped the symbicort/albuterol bc he did not notice a difference   Post hosp OV - Adm 2/25-2/27/15 with increased dyspnea.  Travelled to [place] for Tgiving   Has been exposed to paint fumes within the home, doing some repairs in home.  His CT scan (04/02/13)of the chest, abdomen and pelvis showed no significant evidence for disease progression.  Echo - nml LV fn, trivial pericardial effusion  over last year , breathing, DOE seem to be getting worse. Wears out easily with fatigue and dyspnea.  No chest pain, orthopnea, edema , fever, hemoptysis.  Wt is stable w/ no n/v/d.  PFTs 3/2 /15 nml spirometry, FEV 1 96%, DLCO 33% VQ neg  Transfusion helped    07/02/2013  Chief Complaint  Patient presents with  . Follow-up    per pt breathing is generally pretty good. No wheezing, no chest tx. C/o dry cough, nasal congestion, PND.    Breathing worse after stopping spiriva- restarted completed prednisone Hold off nebs Knee pain better, plts improved, got 1 U prbcs C/o hand cramps   Review of Systems neg for any significant sore throat, dysphagia, itching, sneezing, nasal congestion or excess/ purulent secretions, fever, chills, sweats, unintended wt loss, pleuritic or exertional cp, hempoptysis, orthopnea pnd or change in chronic leg swelling. Also denies presyncope, palpitations, heartburn, abdominal pain, nausea, vomiting, diarrhea or change in bowel or urinary habits, dysuria,hematuria, rash, arthralgias,  visual complaints, headache, numbness weakness or ataxia.     Objective:   Physical Exam  Gen. Pleasant, well-nourished, in no distress ENT - no lesions, no post nasal drip Neck: No JVD, no thyromegaly, no carotid bruits Lungs: no use of accessory muscles, no dullness to percussion, clear without rales or rhonchi  Cardiovascular: Rhythm regular, heart sounds  normal, no murmurs or gallops, no peripheral edema Musculoskeletal: No deformities, no cyanosis or clubbing        Assessment & Plan:

## 2013-07-03 ENCOUNTER — Other Ambulatory Visit: Payer: Self-pay | Admitting: *Deleted

## 2013-07-03 MED ORDER — TIOTROPIUM BROMIDE MONOHYDRATE 2.5 MCG/ACT IN AERS: 2.0000 | INHALATION_SPRAY | Freq: Every day | RESPIRATORY_TRACT | Status: DC

## 2013-07-04 NOTE — Assessment & Plan Note (Signed)
Clearly concern here is of disease progression with stopping/decreasing avastin He would prefer QOL without dyspnea rather than more chemo

## 2013-07-04 NOTE — Assessment & Plan Note (Signed)
Side effects of long term avastin OK to stop prednisone Stay on spiriva Call if worse, doubt that nebs would help him much

## 2013-07-11 ENCOUNTER — Telehealth: Payer: Self-pay | Admitting: *Deleted

## 2013-07-11 ENCOUNTER — Encounter: Payer: Self-pay | Admitting: *Deleted

## 2013-07-11 NOTE — Telephone Encounter (Signed)
Pt called stating that he has been using allegra and wanted to see if he could use flonase instead.  He has also been feeling much better and wanted to let Dr Vista Mink know.  Informed Dr Vista Mink, Per Dr Vista Mink, okay to take flonase.  Pt verbalized understanding.  SLJ

## 2013-07-23 ENCOUNTER — Other Ambulatory Visit: Payer: Self-pay | Admitting: Medical Oncology

## 2013-07-23 ENCOUNTER — Other Ambulatory Visit: Payer: Self-pay | Admitting: Internal Medicine

## 2013-07-23 ENCOUNTER — Telehealth: Payer: Self-pay | Admitting: Medical Oncology

## 2013-07-23 DIAGNOSIS — C3410 Malignant neoplasm of upper lobe, unspecified bronchus or lung: Secondary | ICD-10-CM

## 2013-07-23 NOTE — Telephone Encounter (Signed)
Ct scans  without contrast and pt notified.

## 2013-07-30 ENCOUNTER — Ambulatory Visit (HOSPITAL_COMMUNITY)
Admission: RE | Admit: 2013-07-30 | Discharge: 2013-07-30 | Disposition: A | Discharge status: Home / Self Care | Payer: Medicare Other | Source: Ambulatory Visit | Attending: Internal Medicine | Admitting: Internal Medicine

## 2013-07-30 ENCOUNTER — Other Ambulatory Visit (HOSPITAL_BASED_OUTPATIENT_CLINIC_OR_DEPARTMENT_OTHER): Payer: Medicare Other

## 2013-07-30 DIAGNOSIS — Z923 Personal history of irradiation: Secondary | ICD-10-CM | POA: Insufficient documentation

## 2013-07-30 DIAGNOSIS — Z79899 Other long term (current) drug therapy: Secondary | ICD-10-CM | POA: Insufficient documentation

## 2013-07-30 DIAGNOSIS — C3410 Malignant neoplasm of upper lobe, unspecified bronchus or lung: Secondary | ICD-10-CM

## 2013-07-30 LAB — COMPREHENSIVE METABOLIC PANEL (CC13)
ALK PHOS: 77 U/L (ref 40–150)
ALT: 12 U/L (ref 0–55)
AST: 23 U/L (ref 5–34)
Albumin: 3.1 g/dL — ABNORMAL LOW (ref 3.5–5.0)
Anion Gap: 9 mEq/L (ref 3–11)
BILIRUBIN TOTAL: 0.88 mg/dL (ref 0.20–1.20)
BUN: 28.8 mg/dL — ABNORMAL HIGH (ref 7.0–26.0)
CO2: 19 mEq/L — ABNORMAL LOW (ref 22–29)
CREATININE: 2 mg/dL — AB (ref 0.7–1.3)
Calcium: 9.5 mg/dL (ref 8.4–10.4)
Chloride: 108 mEq/L (ref 98–109)
Glucose: 95 mg/dl (ref 70–140)
Potassium: 4.7 mEq/L (ref 3.5–5.1)
Sodium: 136 mEq/L (ref 136–145)
Total Protein: 6.4 g/dL (ref 6.4–8.3)

## 2013-07-30 LAB — CBC WITH DIFFERENTIAL/PLATELET
BASO%: 1 % (ref 0.0–2.0)
BASOS ABS: 0.1 10*3/uL (ref 0.0–0.1)
EOS ABS: 0.2 10*3/uL (ref 0.0–0.5)
EOS%: 3 % (ref 0.0–7.0)
HCT: 30.4 % — ABNORMAL LOW (ref 38.4–49.9)
HEMOGLOBIN: 10.4 g/dL — AB (ref 13.0–17.1)
LYMPH%: 19.3 % (ref 14.0–49.0)
MCH: 35 pg — ABNORMAL HIGH (ref 27.2–33.4)
MCHC: 34.3 g/dL (ref 32.0–36.0)
MCV: 101.9 fL — AB (ref 79.3–98.0)
MONO#: 1.1 10*3/uL — ABNORMAL HIGH (ref 0.1–0.9)
MONO%: 18 % — ABNORMAL HIGH (ref 0.0–14.0)
NEUT%: 58.7 % (ref 39.0–75.0)
NEUTROS ABS: 3.6 10*3/uL (ref 1.5–6.5)
Platelets: 176 10*3/uL (ref 140–400)
RBC: 2.99 10*6/uL — ABNORMAL LOW (ref 4.20–5.82)
RDW: 17.1 % — ABNORMAL HIGH (ref 11.0–14.6)
WBC: 6.2 10*3/uL (ref 4.0–10.3)
lymph#: 1.2 10*3/uL (ref 0.9–3.3)

## 2013-07-31 ENCOUNTER — Ambulatory Visit: Payer: Medicare Other | Admitting: Internal Medicine

## 2013-07-31 ENCOUNTER — Other Ambulatory Visit: Payer: Medicare Other

## 2013-08-07 ENCOUNTER — Ambulatory Visit (HOSPITAL_BASED_OUTPATIENT_CLINIC_OR_DEPARTMENT_OTHER): Payer: Medicare Other

## 2013-08-07 ENCOUNTER — Encounter: Payer: Self-pay | Admitting: Internal Medicine

## 2013-08-07 ENCOUNTER — Ambulatory Visit (HOSPITAL_BASED_OUTPATIENT_CLINIC_OR_DEPARTMENT_OTHER): Payer: Medicare Other | Admitting: Internal Medicine

## 2013-08-07 ENCOUNTER — Telehealth: Payer: Self-pay | Admitting: Internal Medicine

## 2013-08-07 VITALS — BP 136/75 | HR 60 | Temp 97.6°F | Resp 18 | Ht 68.0 in | Wt 137.1 lb

## 2013-08-07 DIAGNOSIS — C3410 Malignant neoplasm of upper lobe, unspecified bronchus or lung: Secondary | ICD-10-CM

## 2013-08-07 DIAGNOSIS — R5381 Other malaise: Secondary | ICD-10-CM

## 2013-08-07 DIAGNOSIS — J984 Other disorders of lung: Secondary | ICD-10-CM

## 2013-08-07 DIAGNOSIS — N289 Disorder of kidney and ureter, unspecified: Secondary | ICD-10-CM

## 2013-08-07 DIAGNOSIS — R5383 Other fatigue: Secondary | ICD-10-CM

## 2013-08-07 DIAGNOSIS — Z95828 Presence of other vascular implants and grafts: Secondary | ICD-10-CM

## 2013-08-07 DIAGNOSIS — R0602 Shortness of breath: Secondary | ICD-10-CM

## 2013-08-07 MED ORDER — OMEPRAZOLE 40 MG PO CPDR: 40.0000 mg | DELAYED_RELEASE_CAPSULE | Freq: Every evening | ORAL | Status: DC

## 2013-08-07 MED ORDER — SODIUM CHLORIDE 0.9 % IJ SOLN
10.0000 mL | INTRAMUSCULAR | Status: DC | PRN
Start: 2013-08-07 — End: 2013-08-07
  Filled 2013-08-07: qty 10

## 2013-08-07 MED ORDER — HEPARIN SOD (PORK) LOCK FLUSH 100 UNIT/ML IV SOLN
500.0000 [IU] | Freq: Once | INTRAVENOUS | Status: AC
  Administered 2013-08-07: 500 [IU] via INTRAVENOUS
  Filled 2013-08-07: qty 5

## 2013-08-07 MED ORDER — HYDROCODONE-HOMATROPINE 5-1.5 MG/5ML PO SYRP: 5.0000 mL | ORAL_SOLUTION | Freq: Four times a day (QID) | ORAL | Status: DC | PRN

## 2013-08-07 MED ORDER — FOLIC ACID 1 MG PO TABS: 1.0000 mg | ORAL_TABLET | Freq: Every day | ORAL | Status: DC

## 2013-08-07 MED ORDER — FOLIVANE-PLUS PO CAPS: 1.0000 | ORAL_CAPSULE | Freq: Every day | ORAL | Status: DC

## 2013-08-07 NOTE — Patient Instructions (Signed)

## 2013-08-07 NOTE — Telephone Encounter (Signed)
gv and printed appts ched and avs for opt for June thru Dec..Marland Kitchen

## 2013-08-07 NOTE — Progress Notes (Signed)
[facility] [phone]   Fax:([phone]) [place], [place] [address] [place] [address]  DIAGNOSIS AND DIAGNOSIS: Metastatic non-small cell lung cancer adenocarcinoma diagnosed in September 2010   PRIOR THERAPY:  #1 status post 6 cycles of systemic chemotherapy with carboplatin, Alimta and Avastin given every 3 weeks last dose given 04/23/2009 with disease stabilization.  #2 status post palliative radiotherapy to the mediastinum under the care of Dr. Pablo Ledger. The patient received a total dose of 3000 cGY and 12 fractions completed 01/21/2009.  #3 Maintenance chemotherapy with Alimta at 500 mg per meter squared and Avastin at 15 mg per kilogram given every 3 weeks status post 35 cycles.  #4 Maintenance chemotherapy with Alimta 500 mg/M2 and Avastin 15 mg/kg every 4 weeks,status post 17 cycles, discontinued secondary to disease progression.  #5 Systemic chemotherapy with carboplatin for AUC of 5, Alimta 500 mg/M2 and Avastin 15 mg/kg every 3 weeks, status post 3 cycles, last cycle was given 12/28/2012. Carboplatin was discontinued starting cycle #2 secondary to hypersensitivity reaction. #6  Maintenance chemotherapy again with Alimta 500 mg/M2 and Avastin 15 mg/kg every 3 weeks, first cycle 01/23/2013. Status post 6 cycles.   CURRENT THERAPY: Observation  CHEMOTHERAPY INTENT: palliative/maintenance.  CURRENT # OF CHEMOTHERAPY CYCLES: 0 CURRENT ANTIEMETICS: Compazine when necessary but clearly used.  CURRENT SMOKING STATUS: current nonsmoker  ORAL CHEMOTHERAPY AND CONSENT: None.  CURRENT BISPHOSPHONATES USE: None.  PAIN MANAGEMENT: No Pain  NARCOTICS INDUCED CONSTIPATION: N/A  LIVING WILL AND CODE STATUS: NCB   INTERVAL HISTORY:  [name] 68 y.o. male returns to the clinic today for follow up visit accompanied by his wife. The patient has been observation for the last 2 months. He continues to  complain of increasing fatigue and weakness as well as shortness breath with exertion. He is followed closely by Dr. Elsworth Soho for his shortness of breath and he discontinued his treatment with prednisone recently. He denied having any significant chest pain, cough or hemoptysis. He denied having any nausea or vomiting, no fever or chills. He has no significant weight loss or night sweats. He had repeat CT scan of the chest, abdomen and pelvis performed recently and he is here for evaluation and discussion of his scan results.  MEDICAL HISTORY: Past Medical History  Diagnosis Date  . Hypertension   . Arthritis RIGHT SHOULDER  . Immature cataract BILATERAL  . Non-small cell lung cancer DX SEPT 2010  W/ CHEMORADIATION AT THAT TIME -- NOW  W/ METS--  CURRENTLY ON MAINTENANCE CHEMO TX  EVERY 30 DAYS    ONCOLOGIST- DR [facility]  . Acute meniscal tear of knee RIGHT KNEE  . BPH (benign prostatic hypertrophy)     ALLERGIES:  is allergic to carboplatin; percocet; and zolpidem tartrate.  MEDICATIONS:  Current Outpatient Prescriptions  Medication Sig Dispense Refill  . amLODipine (NORVASC) 2.5 MG tablet Take 2.5 mg by mouth every morning.       . Ascorbic Acid (VITAMIN C) 1000 MG tablet Take 1,000 mg by mouth daily.      Marland Kitchen atenolol (TENORMIN) 100 MG tablet Take 100 mg by mouth every morning.       . B Complex-Biotin-FA (B-COMPLEX PO) Take 1 tablet by mouth daily. 1500mg  daily      . doxazosin (CARDURA) 2 MG tablet Take 2 mg by mouth at bedtime.       . FeFum-FePoly-FA-B Cmp-C-Biot (FOLIVANE-PLUS) CAPS Take 1 capsule  by mouth daily.  90 capsule  3  . folic acid (FOLVITE) 1 MG tablet Take 1 tablet by mouth  daily  90 tablet  0  . HYDROcodone-homatropine (HYDROMET) 5-1.5 MG/5ML syrup Take 5 mLs by mouth every 6 (six) hours as needed for cough.  240 mL  0  . Multiple Vitamin (MULTIVITAMIN) tablet Take 1 tablet by mouth daily.       Marland Kitchen omeprazole (PRILOSEC) 40 MG capsule Take 1 capsule (40 mg total) by mouth  every evening.  90 capsule  3  . Sennosides-Docusate Sodium (SENOKOT S PO) Take 2 tablets by mouth daily. 2 tabs daily      . tamsulosin (FLOMAX) 0.4 MG CAPS capsule Take 1 capsule by mouth daily.      . Tiotropium Bromide Monohydrate (SPIRIVA RESPIMAT) 2.5 MCG/ACT AERS Inhale 2 puffs into the lungs daily.  3 Inhaler  1  . triamcinolone (NASACORT) 55 MCG/ACT AERO nasal inhaler Place 2 sprays into the nose daily.       No current facility-administered medications for this visit.    SURGICAL HISTORY:  Past Surgical History  Procedure Laterality Date  . 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 Rotator cuff repair  2008    LEFT SHOULDER  . Bilateral ear drum surgery  1960'S  . Orif left ring finger  and revascularization of radial side  02-16-2007    DEGLOVING INJURY  . Knee arthroscopy  12/21/2011    Procedure: ARTHROSCOPY KNEE;  Surgeon: Tobi Bastos, MD;  Location: [facility];  Service: Orthopedics;  Laterality: Right;  WITH MEDIAL MENISECTOMY    REVIEW OF SYSTEMS:  Constitutional: positive for fatigue Eyes: negative Ears, nose, mouth, throat, and face: negative Respiratory: negative Cardiovascular: negative Gastrointestinal: negative Genitourinary:negative Integument/breast: negative Hematologic/lymphatic: negative Musculoskeletal:negative Neurological: negative Behavioral/Psych: negative Endocrine: negative Allergic/Immunologic: negative   PHYSICAL EXAMINATION: General appearance: alert, cooperative, fatigued and no distress Head: Normocephalic, without obvious abnormality, atraumatic Neck: no adenopathy, no JVD, supple, symmetrical, trachea midline and thyroid not enlarged, symmetric, no tenderness/mass/nodules Lymph nodes: Cervical, supraclavicular, and axillary nodes normal. Resp: clear to auscultation bilaterally Back: symmetric, no curvature. ROM normal. No CVA tenderness. Cardio:  regular rate and rhythm, S1, S2 normal, no murmur, click, rub or gallop GI: soft, non-tender; bowel sounds normal; no masses,  no organomegaly Extremities: extremities normal, atraumatic, no cyanosis or edema Neurologic: Alert and oriented X 3, normal strength and tone. Normal symmetric reflexes. Normal coordination and gait  ECOG PERFORMANCE STATUS: 1 - Symptomatic but completely ambulatory  Blood pressure 136/75, pulse 60, temperature 97.6 F (36.4 C), temperature source Oral, resp. rate 18, height 5\' 8"  (1.727 m), weight 137 lb 1.6 oz (62.188 kg).  LABORATORY DATA: Lab Results  Component Value Date   WBC 6.2 07/30/2013   HGB 10.4* 07/30/2013   HCT 30.4* 07/30/2013   MCV 101.9* 07/30/2013   PLT 176 07/30/2013      Chemistry      Component Value Date/Time   NA 136 07/30/2013 1039   NA 140 05/25/2013 0445   K 4.7 07/30/2013 1039   K 4.2 05/25/2013 0445   CL 109 05/25/2013 0445   CL 109* 08/29/2012 0856   CO2 19* 07/30/2013 1039   CO2 19 05/25/2013 0445   BUN 28.8* 07/30/2013 1039   BUN 30* 05/25/2013 0445   CREATININE 2.0* 07/30/2013 1039   CREATININE 1.95* 05/25/2013 0445  Component Value Date/Time   CALCIUM 9.5 07/30/2013 1039   CALCIUM 8.7 05/25/2013 0445   ALKPHOS 77 07/30/2013 1039   ALKPHOS 55 11/18/2011 0858   AST 23 07/30/2013 1039   AST 25 11/18/2011 0858   ALT 12 07/30/2013 1039   ALT 17 11/18/2011 0858   BILITOT 0.88 07/30/2013 1039   BILITOT 0.5 11/18/2011 0858       RADIOGRAPHIC STUDIES: Ct Chest Wo Contrast  07/30/2013   CLINICAL DATA:  Non-small-cell lung cancer diagnosed in 2010. Chemotherapy in progress.  EXAM: CT CHEST AND ABDOMEN WITHOUT CONTRAST  TECHNIQUE: Multidetector CT imaging of the chest and abdomen was performed following the standard protocol without IV contrast.  COMPARISON:  Prior examinations 04/02/2013 and 06/04/2013.  FINDINGS:   CT CHEST FINDINGS  Right IJ Port-A-Cath appears unchanged with the tip at the SVC right atrial junction. Small pretracheal and right  paratracheal lymph nodes are stable. There are no pathologically enlarged mediastinal, hilar or axillary lymph nodes.  Diffuse atherosclerosis of the aorta, great vessels and coronary arteries is noted. The heart size is normal. There is no significant pleural or pericardial effusion.  Postradiation changes medially in the right hemithorax are stable with scarring, bronchiectasis and calcification, primarily in the upper lobe. Patchy ground-glass densities are again noted in both lower lobes and within the lingula. These appear somewhat progressive over the last 2 months, although there is no consolidation or focal mass.    CT ABDOMEN FINDINGS  As evaluated in the noncontrast state, the liver, spleen, gallbladder, pancreas and adrenal glands appear stable without suspicious findings. Both kidneys demonstrate moderate perinephric soft tissue stranding, although are stable. There is no hydronephrosis. A cyst in the upper pole of the left kidney is stable.  Diffuse aortoiliac atherosclerosis and multiple prominent retroperitoneal lymph nodes are grossly stable. There is no ascites or peritoneal nodularity. The stomach and visualized portions of the small bowel and colon demonstrate no significant findings. The pelvis was not imaged.  There are no worrisome osseous findings.    IMPRESSION: 1. Slightly progressive patchy ground-glass opacities at both lung bases, again likely inflammatory (question drug reaction). There is no focal nodularity to suggest metastatic disease. 2. Stable retroperitoneal lymphadenopathy. No thoracic adenopathy demonstrated. 3. Stable radiation changes in the right hemithorax. 4. Grossly stable atherosclerosis and bilateral perinephric soft tissue stranding.   Electronically Signed   By: Camie Patience M.D.   On: 07/30/2013 13:05   ASSESSMENT AND PLAN: This is a very pleasant 68 years old white male with metastatic non-small cell lung cancer, adenocarcinoma currently undergoing systemic  chemotherapy with Alimta and Avastin status post 6 cycles. The patient tolerated his previous treatment fairly well except for the increasing fatigue and weakness as well as pancytopenia. He has been on observation for the last 2 months but no significant improvement in his fatigue and shortness of breath. His recent scan showed no significant evidence for disease progression except for the patchy groundglass opacities in both lung bases likely inflammatory. I discussed the scan results and give the patient a copy of his report. He'll continue on observation for now with repeat CT scan of the chest, abdomen and pelvis in 3 months for restaging of his disease. For the persistent renal insufficiency, I will refer the patient to nephrology for evaluation of his condition. He was advised to call immediately if he has any concerning symptoms in the interval. The patient voices understanding of current disease status and treatment options and is in agreement with  the current care plan.  All questions were answered. The patient knows to call the clinic with any problems, questions or concerns. We can certainly see the patient much sooner if necessary.  Disclaimer: This note was dictated with voice recognition software. Similar sounding words can inadvertently be transcribed and may not be corrected upon review.

## 2013-08-09 ENCOUNTER — Telehealth: Payer: Self-pay | Admitting: Internal Medicine

## 2013-08-09 NOTE — Telephone Encounter (Signed)
Faxed pt medical records to [facility]

## 2013-08-17 ENCOUNTER — Telehealth: Payer: Self-pay | Admitting: Internal Medicine

## 2013-08-17 NOTE — Telephone Encounter (Signed)
Pt lvm for me regarding not being contacted by [facility] yet...i called [facility] and left another vm with pt info...i lvm for pt with [facility] # and advising that i left [facility] another msg.

## 2013-08-23 ENCOUNTER — Telehealth: Payer: Self-pay | Admitting: Internal Medicine

## 2013-08-23 ENCOUNTER — Telehealth: Payer: Self-pay

## 2013-08-23 NOTE — Telephone Encounter (Signed)
Faxed pt medical records to [facility]

## 2013-08-23 NOTE — Telephone Encounter (Signed)
Fax received from [facility] 08/23/13 notifying this ofc it may take 3-4 months before the patients referral can be scheduled.  Dr [name] notified.  Original sent to scan.

## 2013-09-18 ENCOUNTER — Ambulatory Visit (HOSPITAL_BASED_OUTPATIENT_CLINIC_OR_DEPARTMENT_OTHER): Payer: Medicare Other

## 2013-09-18 VITALS — BP 135/70 | HR 61 | Temp 97.0°F

## 2013-09-18 DIAGNOSIS — C3410 Malignant neoplasm of upper lobe, unspecified bronchus or lung: Secondary | ICD-10-CM

## 2013-09-18 DIAGNOSIS — Z452 Encounter for adjustment and management of vascular access device: Secondary | ICD-10-CM

## 2013-09-18 DIAGNOSIS — Z95828 Presence of other vascular implants and grafts: Secondary | ICD-10-CM

## 2013-09-18 MED ORDER — SODIUM CHLORIDE 0.9 % IJ SOLN
10.0000 mL | INTRAMUSCULAR | Status: DC | PRN
  Administered 2013-09-18: 10 mL via INTRAVENOUS
  Filled 2013-09-18: qty 10

## 2013-09-18 MED ORDER — HEPARIN SOD (PORK) LOCK FLUSH 100 UNIT/ML IV SOLN
500.0000 [IU] | Freq: Once | INTRAVENOUS | Status: AC
  Administered 2013-09-18: 500 [IU] via INTRAVENOUS
  Filled 2013-09-18: qty 5

## 2013-09-18 NOTE — Patient Instructions (Signed)

## 2013-10-01 ENCOUNTER — Telehealth: Payer: Self-pay | Admitting: Internal Medicine

## 2013-10-01 NOTE — Telephone Encounter (Signed)
s.w. pt and advised on 8.11 appt moved to 8.18 due to MD on pal....pt ok and aware

## 2013-10-30 ENCOUNTER — Ambulatory Visit (HOSPITAL_COMMUNITY)
Admission: RE | Admit: 2013-10-30 | Discharge: 2013-10-30 | Disposition: A | Discharge status: Home / Self Care | Payer: Medicare Other | Source: Ambulatory Visit | Attending: Internal Medicine | Admitting: Internal Medicine

## 2013-10-30 ENCOUNTER — Other Ambulatory Visit (HOSPITAL_BASED_OUTPATIENT_CLINIC_OR_DEPARTMENT_OTHER): Payer: Medicare Other

## 2013-10-30 ENCOUNTER — Ambulatory Visit: Payer: Medicare Other

## 2013-10-30 ENCOUNTER — Encounter (HOSPITAL_COMMUNITY): Payer: Self-pay

## 2013-10-30 VITALS — BP 146/74 | HR 54 | Temp 98.4°F

## 2013-10-30 DIAGNOSIS — C3490 Malignant neoplasm of unspecified part of unspecified bronchus or lung: Secondary | ICD-10-CM | POA: Insufficient documentation

## 2013-10-30 DIAGNOSIS — Z9221 Personal history of antineoplastic chemotherapy: Secondary | ICD-10-CM | POA: Diagnosis not present

## 2013-10-30 DIAGNOSIS — Y842 Radiological procedure and radiotherapy as the cause of abnormal reaction of the patient, or of later complication, without mention of misadventure at the time of the procedure: Secondary | ICD-10-CM | POA: Diagnosis not present

## 2013-10-30 DIAGNOSIS — N289 Disorder of kidney and ureter, unspecified: Secondary | ICD-10-CM | POA: Insufficient documentation

## 2013-10-30 DIAGNOSIS — T66XXXA Radiation sickness, unspecified, initial encounter: Secondary | ICD-10-CM | POA: Insufficient documentation

## 2013-10-30 DIAGNOSIS — I77811 Abdominal aortic ectasia: Secondary | ICD-10-CM | POA: Diagnosis not present

## 2013-10-30 DIAGNOSIS — J438 Other emphysema: Secondary | ICD-10-CM | POA: Diagnosis not present

## 2013-10-30 DIAGNOSIS — I2510 Atherosclerotic heart disease of native coronary artery without angina pectoris: Secondary | ICD-10-CM | POA: Insufficient documentation

## 2013-10-30 DIAGNOSIS — Z923 Personal history of irradiation: Secondary | ICD-10-CM | POA: Insufficient documentation

## 2013-10-30 DIAGNOSIS — C3491 Malignant neoplasm of unspecified part of right bronchus or lung: Secondary | ICD-10-CM

## 2013-10-30 DIAGNOSIS — C3410 Malignant neoplasm of upper lobe, unspecified bronchus or lung: Secondary | ICD-10-CM

## 2013-10-30 DIAGNOSIS — Z95828 Presence of other vascular implants and grafts: Secondary | ICD-10-CM

## 2013-10-30 LAB — COMPREHENSIVE METABOLIC PANEL (CC13)
ALT: 13 U/L (ref 0–55)
ANION GAP: 9 meq/L (ref 3–11)
AST: 23 U/L (ref 5–34)
Albumin: 3.7 g/dL (ref 3.5–5.0)
Alkaline Phosphatase: 70 U/L (ref 40–150)
BUN: 32 mg/dL — AB (ref 7.0–26.0)
CALCIUM: 9.5 mg/dL (ref 8.4–10.4)
CHLORIDE: 110 meq/L — AB (ref 98–109)
CO2: 21 mEq/L — ABNORMAL LOW (ref 22–29)
CREATININE: 1.7 mg/dL — AB (ref 0.7–1.3)
GLUCOSE: 103 mg/dL (ref 70–140)
Potassium: 4.6 mEq/L (ref 3.5–5.1)
Sodium: 140 mEq/L (ref 136–145)
Total Bilirubin: 0.71 mg/dL (ref 0.20–1.20)
Total Protein: 6.9 g/dL (ref 6.4–8.3)

## 2013-10-30 LAB — CBC WITH DIFFERENTIAL/PLATELET
BASO%: 0.5 % (ref 0.0–2.0)
BASOS ABS: 0 10*3/uL (ref 0.0–0.1)
EOS ABS: 0.2 10*3/uL (ref 0.0–0.5)
EOS%: 2.5 % (ref 0.0–7.0)
HEMATOCRIT: 33.3 % — AB (ref 38.4–49.9)
HEMOGLOBIN: 11 g/dL — AB (ref 13.0–17.1)
LYMPH%: 19.8 % (ref 14.0–49.0)
MCH: 32.7 pg (ref 27.2–33.4)
MCHC: 33.1 g/dL (ref 32.0–36.0)
MCV: 98.8 fL — ABNORMAL HIGH (ref 79.3–98.0)
MONO#: 0.8 10*3/uL (ref 0.1–0.9)
MONO%: 12 % (ref 0.0–14.0)
NEUT%: 65.2 % (ref 39.0–75.0)
NEUTROS ABS: 4.2 10*3/uL (ref 1.5–6.5)
PLATELETS: 155 10*3/uL (ref 140–400)
RBC: 3.37 10*6/uL — ABNORMAL LOW (ref 4.20–5.82)
RDW: 13.5 % (ref 11.0–14.6)
WBC: 6.4 10*3/uL (ref 4.0–10.3)
lymph#: 1.3 10*3/uL (ref 0.9–3.3)

## 2013-10-30 LAB — UA PROTEIN, DIPSTICK - CHCC: Protein, ur: NEGATIVE mg/dL

## 2013-10-30 MED ORDER — HEPARIN SOD (PORK) LOCK FLUSH 100 UNIT/ML IV SOLN
500.0000 [IU] | Freq: Once | INTRAVENOUS | Status: AC
  Administered 2013-10-30: 500 [IU] via INTRAVENOUS
  Filled 2013-10-30: qty 5

## 2013-10-30 MED ORDER — SODIUM CHLORIDE 0.9 % IJ SOLN
10.0000 mL | INTRAMUSCULAR | Status: DC | PRN
  Administered 2013-10-30: 10 mL via INTRAVENOUS
  Filled 2013-10-30: qty 10

## 2013-10-30 NOTE — Patient Instructions (Signed)

## 2013-11-06 ENCOUNTER — Ambulatory Visit: Payer: Medicare Other | Admitting: Internal Medicine

## 2013-11-13 ENCOUNTER — Telehealth: Payer: Self-pay | Admitting: Internal Medicine

## 2013-11-13 ENCOUNTER — Ambulatory Visit (HOSPITAL_BASED_OUTPATIENT_CLINIC_OR_DEPARTMENT_OTHER): Payer: Medicare Other | Admitting: Internal Medicine

## 2013-11-13 ENCOUNTER — Encounter: Payer: Self-pay | Admitting: Internal Medicine

## 2013-11-13 VITALS — BP 135/72 | HR 60 | Temp 97.6°F | Resp 18 | Ht 68.0 in | Wt 140.6 lb

## 2013-11-13 DIAGNOSIS — D61818 Other pancytopenia: Secondary | ICD-10-CM

## 2013-11-13 DIAGNOSIS — C3410 Malignant neoplasm of upper lobe, unspecified bronchus or lung: Secondary | ICD-10-CM

## 2013-11-13 DIAGNOSIS — R5383 Other fatigue: Secondary | ICD-10-CM

## 2013-11-13 DIAGNOSIS — N189 Chronic kidney disease, unspecified: Secondary | ICD-10-CM

## 2013-11-13 DIAGNOSIS — R42 Dizziness and giddiness: Secondary | ICD-10-CM

## 2013-11-13 DIAGNOSIS — R0602 Shortness of breath: Secondary | ICD-10-CM

## 2013-11-13 DIAGNOSIS — R5381 Other malaise: Secondary | ICD-10-CM

## 2013-11-13 DIAGNOSIS — N289 Disorder of kidney and ureter, unspecified: Secondary | ICD-10-CM

## 2013-11-13 DIAGNOSIS — N179 Acute kidney failure, unspecified: Secondary | ICD-10-CM

## 2013-11-13 MED ORDER — FOLIC ACID 1 MG PO TABS: 1.0000 mg | ORAL_TABLET | Freq: Every day | ORAL | Status: DC

## 2013-11-13 MED ORDER — FOLIVANE-PLUS PO CAPS: 1.0000 | ORAL_CAPSULE | Freq: Every day | ORAL | Status: DC

## 2013-11-13 MED ORDER — OMEPRAZOLE 40 MG PO CPDR: 40.0000 mg | DELAYED_RELEASE_CAPSULE | Freq: Every evening | ORAL | Status: DC

## 2013-11-13 NOTE — Progress Notes (Signed)
[place] Telephone:([phone]) [phone]   Fax:(336) [place], MD [address] [place] [address]  DIAGNOSIS AND DIAGNOSIS: Metastatic non-small cell lung cancer adenocarcinoma diagnosed in September 2010   PRIOR THERAPY:  #1 status post 6 cycles of systemic chemotherapy with carboplatin, Alimta and Avastin given every 3 weeks last dose given 04/23/2009 with disease stabilization.  #2 status post palliative radiotherapy to the mediastinum under the care of Dr. Pablo Ledger. The patient received a total dose of 3000 cGY and 12 fractions completed 01/21/2009.  #3 Maintenance chemotherapy with Alimta at 500 mg per meter squared and Avastin at 15 mg per kilogram given every 3 weeks status post 35 cycles.  #4 Maintenance chemotherapy with Alimta 500 mg/M2 and Avastin 15 mg/kg every 4 weeks,status post 17 cycles, discontinued secondary to disease progression.  #5 Systemic chemotherapy with carboplatin for AUC of 5, Alimta 500 mg/M2 and Avastin 15 mg/kg every 3 weeks, status post 3 cycles, last cycle was given 12/28/2012. Carboplatin was discontinued starting cycle #2 secondary to hypersensitivity reaction. #6  Maintenance chemotherapy again with Alimta 500 mg/M2 and Avastin 15 mg/kg every 3 weeks, first cycle 01/23/2013. Status post 6 cycles.  CURRENT THERAPY: Observation  CHEMOTHERAPY INTENT: palliative/maintenance.  CURRENT # OF CHEMOTHERAPY CYCLES: 0 CURRENT ANTIEMETICS: Compazine when necessary but clearly used.  CURRENT SMOKING STATUS: current nonsmoker  ORAL CHEMOTHERAPY AND CONSENT: None.  CURRENT BISPHOSPHONATES USE: None.  PAIN MANAGEMENT: No Pain  NARCOTICS INDUCED CONSTIPATION: N/A  LIVING WILL AND CODE STATUS: NCB   INTERVAL HISTORY:  [name] 68 y.o. male returns to the clinic today for follow up visit accompanied by his wife. The patient has been observation for the last 5 months. He has occasional  episodes of vertigo attributed it to some chronic hearing problems. He is scheduled to see his ENT physician in the next few weeks. He denied having any significant chest pain, cough or hemoptysis. He denied having any nausea or vomiting, no fever or chills. He has no significant weight loss or night sweats. He was referred to [place] [facility] several months ago for evaluation of his renal insufficiency but the patient was not given an appointment yet. He is not happy about it and requested referral to a different nephrology practice. He had repeat CT scan of the chest, abdomen and pelvis performed recently and he is here for evaluation and discussion of his scan results.  MEDICAL HISTORY: Past Medical History  Diagnosis Date  . Hypertension   . Arthritis RIGHT SHOULDER  . Immature cataract BILATERAL  . Non-small cell lung cancer DX SEPT 2010  W/ CHEMORADIATION AT THAT TIME -- NOW  W/ METS--  CURRENTLY ON MAINTENANCE CHEMO TX  EVERY 30 DAYS    ONCOLOGIST- [facility]  . Acute meniscal tear of knee RIGHT KNEE  . BPH (benign prostatic hypertrophy)     ALLERGIES:  is allergic to carboplatin; percocet; and zolpidem tartrate.  MEDICATIONS:  Current Outpatient Prescriptions  Medication Sig Dispense Refill  . amLODipine (NORVASC) 2.5 MG tablet Take 2.5 mg by mouth every morning.       . Ascorbic Acid (VITAMIN C) 1000 MG tablet Take 1,000 mg by mouth daily.      Marland Kitchen atenolol (TENORMIN) 100 MG tablet Take 100 mg by mouth every morning.       . B Complex-Biotin-FA (B-COMPLEX PO) Take 1 tablet by mouth daily. 1500mg  daily      .  doxazosin (CARDURA) 2 MG tablet Take 2 mg by mouth at bedtime.       . FeFum-FePoly-FA-B Cmp-C-Biot (FOLIVANE-PLUS) CAPS Take 1 capsule by mouth daily.  90 capsule  3  . folic acid (FOLVITE) 1 MG tablet Take 1 tablet (1 mg total) by mouth daily.  90 tablet  0  . HYDROcodone-homatropine (HYDROMET) 5-1.5 MG/5ML syrup Take 5 mLs by mouth every 6 (six) hours as needed for cough.  240  mL  0  . Multiple Vitamin (MULTIVITAMIN) tablet Take 1 tablet by mouth daily.       Marland Kitchen omeprazole (PRILOSEC) 40 MG capsule Take 1 capsule (40 mg total) by mouth every evening.  90 capsule  3  . Sennosides-Docusate Sodium (SENOKOT S PO) Take 2 tablets by mouth daily. 2 tabs daily      . tamsulosin (FLOMAX) 0.4 MG CAPS capsule Take 1 capsule by mouth daily.      Marland Kitchen triamcinolone (NASACORT) 55 MCG/ACT AERO nasal inhaler Place 2 sprays into the nose daily.       No current facility-administered medications for this visit.    SURGICAL HISTORY:  Past Surgical History  Procedure Laterality Date  . 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 Rotator cuff repair  2008    LEFT SHOULDER  . Bilateral ear drum surgery  1960'S  . Orif left ring finger  and revascularization of radial side  02-16-2007    DEGLOVING INJURY  . Knee arthroscopy  12/21/2011    Procedure: ARTHROSCOPY KNEE;  Surgeon: Tobi Bastos, MD;  Location[facility];  Service: Orthopedics;  Laterality: Right;  WITH MEDIAL MENISECTOMY    REVIEW OF SYSTEMS:  Constitutional: positive for fatigue Eyes: negative Ears, nose, mouth, throat, and face: negative Respiratory: negative Cardiovascular: negative Gastrointestinal: negative Genitourinary:negative Integument/breast: negative Hematologic/lymphatic: negative Musculoskeletal:negative Neurological: negative Behavioral/Psych: negative Endocrine: negative Allergic/Immunologic: negative   PHYSICAL EXAMINATION: General appearance: alert, cooperative, fatigued and no distress Head: Normocephalic, without obvious abnormality, atraumatic Neck: no adenopathy, no JVD, supple, symmetrical, trachea midline and thyroid not enlarged, symmetric, no tenderness/mass/nodules Lymph nodes: Cervical, supraclavicular, and axillary nodes normal. Resp: clear to auscultation bilaterally Back: symmetric, no curvature.  ROM normal. No CVA tenderness. Cardio: regular rate and rhythm, S1, S2 normal, no murmur, click, rub or gallop GI: soft, non-tender; bowel sounds normal; no masses,  no organomegaly Extremities: extremities normal, atraumatic, no cyanosis or edema Neurologic: Alert and oriented X 3, normal strength and tone. Normal symmetric reflexes. Normal coordination and gait  ECOG PERFORMANCE STATUS: 1 - Symptomatic but completely ambulatory  Blood pressure 135/72, pulse 60, temperature 97.6 F (36.4 C), temperature source Oral, resp. rate 18, height 5\' 8"  (1.727 m), weight 140 lb 9.6 oz (63.776 kg), SpO2 99.00%.  LABORATORY DATA: Lab Results  Component Value Date   WBC 6.4 10/30/2013   HGB 11.0* 10/30/2013   HCT 33.3* 10/30/2013   MCV 98.8* 10/30/2013   PLT 155 10/30/2013      Chemistry      Component Value Date/Time   NA 140 10/30/2013 1058   NA 140 05/25/2013 0445   K 4.6 10/30/2013 1058   K 4.2 05/25/2013 0445   CL 109 05/25/2013 0445   CL 109* 08/29/2012 0856   CO2 21* 10/30/2013 1058   CO2 19 05/25/2013 0445   BUN 32.0* 10/30/2013 1058   BUN 30* 05/25/2013 0445   CREATININE 1.7* 10/30/2013 1058   CREATININE  1.95* 05/25/2013 0445      Component Value Date/Time   CALCIUM 9.5 10/30/2013 1058   CALCIUM 8.7 05/25/2013 0445   ALKPHOS 70 10/30/2013 1058   ALKPHOS 55 11/18/2011 0858   AST 23 10/30/2013 1058   AST 25 11/18/2011 0858   ALT 13 10/30/2013 1058   ALT 17 11/18/2011 0858   BILITOT 0.71 10/30/2013 1058   BILITOT 0.5 11/18/2011 0858       RADIOGRAPHIC STUDIES: Ct Abdomen Pelvis Wo Contrast  10/30/2013   CLINICAL DATA:  Lung cancer diagnosed 2010. Prior radiation and chemotherapy.  EXAM: CT CHEST, ABDOMEN AND PELVIS WITHOUT CONTRAST  TECHNIQUE: Multidetector CT imaging of the chest, abdomen and pelvis was performed following the standard protocol without IV contrast.  COMPARISON:  None.  FINDINGS: CT CHEST FINDINGS  Right Port-A-Cath remains in place with the tip in the lower SVC. Heart is normal size. Densely  calcified coronary arteries, stable. Scattered aortic calcifications without aneurysm.  Small scattered mediastinal lymph nodes are stable. Index right paratracheal lymph node has a short axis diameter of 9 mm on image 26, stable. No mediastinal, hilar or visible axillary adenopathy on this unenhanced study.  Post radiation changes noted in the paramediastinal right upper lobe, stable. Mild paraseptal emphysema. No new or enlarging pulmonary nodules. 4 mm nodule in the lingula on image 46 is stable. Previously seen ground-glass opacities in the lower lobes not as well visualized on today's study. No pleural effusions.  No acute bony abnormality. Degenerative changes in the thoracic spine.  CT ABDOMEN AND PELVIS FINDINGS  Liver, gallbladder, spleen, pancreas, adrenals have an unremarkable unenhanced appearance. Small low-density lesion in the midpole of the left kidney is stable and cannot be characterized without intravenous contrast but most likely represents a small cyst. No hydronephrosis. Bilateral perinephric stranding noted, stable.  Mild ectasia of the abdominal aorta which is heavily calcified. No free fluid, free air or adenopathy. Urinary bladder and prostate grossly unremarkable. Stomach, large and small bowel grossly unremarkable.  No acute bony abnormality or focal bone lesion.  IMPRESSION: Postradiation changes in the right medial hemithorax.  Mild emphysema.  Stable 4 mm lingular nodule.  No evidence of recurrent or metastatic disease.   Electronically Signed   By: Rolm Baptise M.D.   On: 10/30/2013 14:57   Ct Chest Wo Contrast  10/30/2013   CLINICAL DATA:  Lung cancer diagnosed 2010. Prior radiation and chemotherapy.  EXAM: CT CHEST, ABDOMEN AND PELVIS WITHOUT CONTRAST  TECHNIQUE: Multidetector CT imaging of the chest, abdomen and pelvis was performed following the standard protocol without IV contrast.  COMPARISON:  None.  FINDINGS: CT CHEST FINDINGS  Right Port-A-Cath remains in place with the  tip in the lower SVC. Heart is normal size. Densely calcified coronary arteries, stable. Scattered aortic calcifications without aneurysm.  Small scattered mediastinal lymph nodes are stable. Index right paratracheal lymph node has a short axis diameter of 9 mm on image 26, stable. No mediastinal, hilar or visible axillary adenopathy on this unenhanced study.  Post radiation changes noted in the paramediastinal right upper lobe, stable. Mild paraseptal emphysema. No new or enlarging pulmonary nodules. 4 mm nodule in the lingula on image 46 is stable. Previously seen ground-glass opacities in the lower lobes not as well visualized on today's study. No pleural effusions.  No acute bony abnormality. Degenerative changes in the thoracic spine.  CT ABDOMEN AND PELVIS FINDINGS  Liver, gallbladder, spleen, pancreas, adrenals have an unremarkable unenhanced appearance. Small low-density lesion in the  midpole of the left kidney is stable and cannot be characterized without intravenous contrast but most likely represents a small cyst. No hydronephrosis. Bilateral perinephric stranding noted, stable.  Mild ectasia of the abdominal aorta which is heavily calcified. No free fluid, free air or adenopathy. Urinary bladder and prostate grossly unremarkable. Stomach, large and small bowel grossly unremarkable.  No acute bony abnormality or focal bone lesion.  IMPRESSION: Postradiation changes in the right medial hemithorax.  Mild emphysema.  Stable 4 mm lingular nodule.  No evidence of recurrent or metastatic disease.   Electronically Signed   By: Rolm Baptise M.D.   On: 10/30/2013 14:57   ASSESSMENT AND PLAN: This is a very pleasant 68 years old white male with metastatic non-small cell lung cancer, adenocarcinoma currently undergoing systemic chemotherapy with Alimta and Avastin status post 6 cycles. The patient tolerated his previous treatment fairly well except for the increasing fatigue and weakness as well as  pancytopenia. He has been on observation for the last 5 months but no significant improvement in his fatigue and shortness of breath. His recent CT scan of the chest, abdomen and pelvis showed no significant evidence for disease progression. I discussed the scan results with the patient and his wife I recommended for him to continue on observation with repeat CT scan of the chest, abdomen and pelvis without contrast in 3 months. For the vertigo, the patient would see his ENT physician soon and if there is no significant abnormalities I may consider him for repeat MRI of the brain to rule out brain metastasis. For the persistent renal insufficiency, I will refer the patient to a different neph[place] [facility] [place] since he was unable to get an appointment with [place] [facility]. He was advised to call immediately if he has any concerning symptoms in the interval. The patient voices understanding of current disease status and treatment options and is in agreement with the current care plan.  All questions were answered. The patient knows to call the clinic with any problems, questions or concerns. We can certainly see the patient much sooner if necessary.  Disclaimer: This note was dictated with voice recognition software. Similar sounding words can inadvertently be transcribed and may not be corrected upon review.

## 2013-11-13 NOTE — Telephone Encounter (Signed)
gv adn printed appt sched and avs for pt for Sept thru Dec

## 2013-11-23 ENCOUNTER — Telehealth: Payer: Self-pay | Admitting: Internal Medicine

## 2013-11-23 NOTE — Telephone Encounter (Signed)
Faxed pt medical records to [facility]. Nurse will review records to determine how soon pt needs to be seen and call pt with appt. Pt is aware.

## 2013-11-28 ENCOUNTER — Telehealth: Payer: Self-pay | Admitting: Internal Medicine

## 2013-11-28 NOTE — Telephone Encounter (Signed)
Pt appt. To see Dr. Durene Romans @ [facility]  Is 12/10/13@10 :00. Medical records faxed. Pt is aware

## 2013-11-29 ENCOUNTER — Telehealth: Payer: Self-pay | Admitting: Medical Oncology

## 2013-11-29 NOTE — Telephone Encounter (Signed)
Pt refused referral to [employer].

## 2013-12-06 ENCOUNTER — Other Ambulatory Visit: Payer: Self-pay | Admitting: Otolaryngology

## 2013-12-06 DIAGNOSIS — H9190 Unspecified hearing loss, unspecified ear: Secondary | ICD-10-CM

## 2013-12-06 DIAGNOSIS — R42 Dizziness and giddiness: Secondary | ICD-10-CM

## 2013-12-11 ENCOUNTER — Ambulatory Visit (HOSPITAL_BASED_OUTPATIENT_CLINIC_OR_DEPARTMENT_OTHER): Payer: Medicare Other

## 2013-12-11 VITALS — BP 145/80 | HR 72 | Temp 97.9°F

## 2013-12-11 DIAGNOSIS — Z95828 Presence of other vascular implants and grafts: Secondary | ICD-10-CM

## 2013-12-11 DIAGNOSIS — Z452 Encounter for adjustment and management of vascular access device: Secondary | ICD-10-CM

## 2013-12-11 DIAGNOSIS — C3410 Malignant neoplasm of upper lobe, unspecified bronchus or lung: Secondary | ICD-10-CM

## 2013-12-11 MED ORDER — SODIUM CHLORIDE 0.9 % IJ SOLN
10.0000 mL | INTRAMUSCULAR | Status: DC | PRN
  Administered 2013-12-11: 10 mL via INTRAVENOUS
  Filled 2013-12-11: qty 10

## 2013-12-11 MED ORDER — HEPARIN SOD (PORK) LOCK FLUSH 100 UNIT/ML IV SOLN
500.0000 [IU] | Freq: Once | INTRAVENOUS | Status: AC
  Administered 2013-12-11: 500 [IU] via INTRAVENOUS
  Filled 2013-12-11: qty 5

## 2013-12-11 NOTE — Patient Instructions (Signed)

## 2013-12-13 ENCOUNTER — Ambulatory Visit
Admission: RE | Admit: 2013-12-13 | Discharge: 2013-12-13 | Disposition: A | Discharge status: Home / Self Care | Payer: Medicare Other | Source: Ambulatory Visit | Attending: Otolaryngology | Admitting: Otolaryngology

## 2013-12-13 DIAGNOSIS — R42 Dizziness and giddiness: Secondary | ICD-10-CM

## 2013-12-13 DIAGNOSIS — H9190 Unspecified hearing loss, unspecified ear: Secondary | ICD-10-CM

## 2013-12-13 MED ORDER — GADOBENATE DIMEGLUMINE 529 MG/ML IV SOLN
7.0000 mL | Freq: Once | INTRAVENOUS | Status: AC | PRN
  Administered 2013-12-13: 7 mL via INTRAVENOUS

## 2014-01-22 ENCOUNTER — Ambulatory Visit (HOSPITAL_BASED_OUTPATIENT_CLINIC_OR_DEPARTMENT_OTHER): Payer: Medicare Other

## 2014-01-22 ENCOUNTER — Telehealth: Payer: Self-pay | Admitting: Medical Oncology

## 2014-01-22 VITALS — BP 151/84 | HR 68 | Temp 97.8°F | Resp 16

## 2014-01-22 DIAGNOSIS — Z95828 Presence of other vascular implants and grafts: Secondary | ICD-10-CM

## 2014-01-22 DIAGNOSIS — C3411 Malignant neoplasm of upper lobe, right bronchus or lung: Secondary | ICD-10-CM

## 2014-01-22 DIAGNOSIS — Z452 Encounter for adjustment and management of vascular access device: Secondary | ICD-10-CM

## 2014-01-22 MED ORDER — HEPARIN SOD (PORK) LOCK FLUSH 100 UNIT/ML IV SOLN
500.0000 [IU] | Freq: Once | INTRAVENOUS | Status: AC
  Administered 2014-01-22: 500 [IU] via INTRAVENOUS
  Filled 2014-01-22: qty 5

## 2014-01-22 MED ORDER — SODIUM CHLORIDE 0.9 % IJ SOLN
10.0000 mL | INTRAMUSCULAR | Status: DC | PRN
  Administered 2014-01-22: 10 mL via INTRAVENOUS
  Filled 2014-01-22: qty 10

## 2014-01-22 NOTE — Patient Instructions (Signed)

## 2014-01-22 NOTE — Telephone Encounter (Signed)
Wife reports pt still dizzy with "vertigo". She stated his MRI was negative . Asking what  to do next. Note to [place].

## 2014-01-23 ENCOUNTER — Telehealth: Payer: Self-pay | Admitting: Medical Oncology

## 2014-01-23 ENCOUNTER — Other Ambulatory Visit: Payer: Self-pay | Admitting: Nurse Practitioner

## 2014-01-23 NOTE — Telephone Encounter (Signed)
Message copied by Ardeen Garland on Wed Jan 23, 2014  2:37 PM ------      Message from: [name]      Created: Tue Jan 22, 2014  9:49 PM       ENT evaluation      ----- Message -----         From: Ardeen Garland, RN         Sent: 01/22/2014  12:43 PM           To: [name], MD            Wife reports pt still dizzy with "vertigo". She stated his MRI was negative . Asking what  to do next.             ------

## 2014-01-23 NOTE — Telephone Encounter (Signed)
Left message for pt to f/u with [facility]

## 2014-01-23 NOTE — Telephone Encounter (Signed)
Pt has follow up with Dr Warren Danes in 2 weeks. "My wife got a little over anxious and called you"

## 2014-02-05 ENCOUNTER — Ambulatory Visit (HOSPITAL_BASED_OUTPATIENT_CLINIC_OR_DEPARTMENT_OTHER): Payer: Medicare Other

## 2014-02-05 ENCOUNTER — Other Ambulatory Visit (HOSPITAL_BASED_OUTPATIENT_CLINIC_OR_DEPARTMENT_OTHER): Payer: Medicare Other

## 2014-02-05 ENCOUNTER — Ambulatory Visit (HOSPITAL_COMMUNITY)
Admission: RE | Admit: 2014-02-05 | Discharge: 2014-02-05 | Disposition: A | Discharge status: Home / Self Care | Payer: Medicare Other | Source: Ambulatory Visit | Attending: Internal Medicine | Admitting: Internal Medicine

## 2014-02-05 DIAGNOSIS — C3411 Malignant neoplasm of upper lobe, right bronchus or lung: Secondary | ICD-10-CM

## 2014-02-05 DIAGNOSIS — C3491 Malignant neoplasm of unspecified part of right bronchus or lung: Secondary | ICD-10-CM | POA: Diagnosis present

## 2014-02-05 DIAGNOSIS — N281 Cyst of kidney, acquired: Secondary | ICD-10-CM | POA: Insufficient documentation

## 2014-02-05 DIAGNOSIS — C3410 Malignant neoplasm of upper lobe, unspecified bronchus or lung: Secondary | ICD-10-CM | POA: Insufficient documentation

## 2014-02-05 DIAGNOSIS — J432 Centrilobular emphysema: Secondary | ICD-10-CM | POA: Diagnosis not present

## 2014-02-05 DIAGNOSIS — I700 Atherosclerosis of aorta: Secondary | ICD-10-CM | POA: Insufficient documentation

## 2014-02-05 DIAGNOSIS — N400 Enlarged prostate without lower urinary tract symptoms: Secondary | ICD-10-CM | POA: Diagnosis not present

## 2014-02-05 DIAGNOSIS — J8410 Pulmonary fibrosis, unspecified: Secondary | ICD-10-CM | POA: Insufficient documentation

## 2014-02-05 DIAGNOSIS — J984 Other disorders of lung: Secondary | ICD-10-CM | POA: Insufficient documentation

## 2014-02-05 DIAGNOSIS — I2510 Atherosclerotic heart disease of native coronary artery without angina pectoris: Secondary | ICD-10-CM | POA: Insufficient documentation

## 2014-02-05 DIAGNOSIS — Z95828 Presence of other vascular implants and grafts: Secondary | ICD-10-CM

## 2014-02-05 LAB — CBC WITH DIFFERENTIAL/PLATELET
BASO%: 0.9 % (ref 0.0–2.0)
Basophils Absolute: 0.1 10*3/uL (ref 0.0–0.1)
EOS ABS: 0.3 10*3/uL (ref 0.0–0.5)
EOS%: 3.6 % (ref 0.0–7.0)
HEMATOCRIT: 35.6 % — AB (ref 38.4–49.9)
HEMOGLOBIN: 11.8 g/dL — AB (ref 13.0–17.1)
LYMPH#: 1.8 10*3/uL (ref 0.9–3.3)
LYMPH%: 20.1 % (ref 14.0–49.0)
MCH: 31.4 pg (ref 27.2–33.4)
MCHC: 33.2 g/dL (ref 32.0–36.0)
MCV: 94.5 fL (ref 79.3–98.0)
MONO#: 0.9 10*3/uL (ref 0.1–0.9)
MONO%: 9.8 % (ref 0.0–14.0)
NEUT#: 5.9 10*3/uL (ref 1.5–6.5)
NEUT%: 65.6 % (ref 39.0–75.0)
Platelets: 200 10*3/uL (ref 140–400)
RBC: 3.77 10*6/uL — ABNORMAL LOW (ref 4.20–5.82)
RDW: 12.8 % (ref 11.0–14.6)
WBC: 9 10*3/uL (ref 4.0–10.3)

## 2014-02-05 LAB — COMPREHENSIVE METABOLIC PANEL (CC13)
ALT: 17 U/L (ref 0–55)
ANION GAP: 7 meq/L (ref 3–11)
AST: 18 U/L (ref 5–34)
Albumin: 3.8 g/dL (ref 3.5–5.0)
Alkaline Phosphatase: 92 U/L (ref 40–150)
BUN: 25.1 mg/dL (ref 7.0–26.0)
CALCIUM: 9.9 mg/dL (ref 8.4–10.4)
CHLORIDE: 107 meq/L (ref 98–109)
CO2: 26 meq/L (ref 22–29)
CREATININE: 1.5 mg/dL — AB (ref 0.7–1.3)
GLUCOSE: 102 mg/dL (ref 70–140)
Potassium: 4.3 mEq/L (ref 3.5–5.1)
Sodium: 140 mEq/L (ref 136–145)
Total Bilirubin: 0.71 mg/dL (ref 0.20–1.20)
Total Protein: 7.1 g/dL (ref 6.4–8.3)

## 2014-02-05 MED ORDER — HEPARIN SOD (PORK) LOCK FLUSH 100 UNIT/ML IV SOLN
500.0000 [IU] | Freq: Once | INTRAVENOUS | Status: AC
  Administered 2014-02-05: 500 [IU] via INTRAVENOUS
  Filled 2014-02-05: qty 5

## 2014-02-05 MED ORDER — SODIUM CHLORIDE 0.9 % IJ SOLN
10.0000 mL | INTRAMUSCULAR | Status: DC | PRN
  Administered 2014-02-05: 10 mL via INTRAVENOUS
  Filled 2014-02-05: qty 10

## 2014-02-05 NOTE — Patient Instructions (Signed)

## 2014-02-12 ENCOUNTER — Ambulatory Visit (HOSPITAL_BASED_OUTPATIENT_CLINIC_OR_DEPARTMENT_OTHER): Payer: Medicare Other | Admitting: Internal Medicine

## 2014-02-12 ENCOUNTER — Encounter: Payer: Self-pay | Admitting: Internal Medicine

## 2014-02-12 ENCOUNTER — Telehealth: Payer: Self-pay | Admitting: Internal Medicine

## 2014-02-12 VITALS — BP 131/81 | HR 58 | Temp 97.7°F | Resp 18 | Ht 68.0 in | Wt 135.2 lb

## 2014-02-12 DIAGNOSIS — D61818 Other pancytopenia: Secondary | ICD-10-CM

## 2014-02-12 DIAGNOSIS — C3410 Malignant neoplasm of upper lobe, unspecified bronchus or lung: Secondary | ICD-10-CM

## 2014-02-12 DIAGNOSIS — C3492 Malignant neoplasm of unspecified part of left bronchus or lung: Secondary | ICD-10-CM

## 2014-02-12 DIAGNOSIS — N289 Disorder of kidney and ureter, unspecified: Secondary | ICD-10-CM

## 2014-02-12 NOTE — Telephone Encounter (Signed)
Pt confirmed labs/ov per 11/17 POF, gave pt AVS..... KJ

## 2014-02-12 NOTE — Progress Notes (Signed)
[place] [phone]   Fax:([phone]) [place], [place] [address] [place] [address]  DIAGNOSIS AND DIAGNOSIS: Metastatic non-small cell lung cancer adenocarcinoma diagnosed in September 2010   PRIOR THERAPY:  #1 status post 6 cycles of systemic chemotherapy with carboplatin, Alimta and Avastin given every 3 weeks last dose given 04/23/2009 with disease stabilization.  #2 status post palliative radiotherapy to the mediastinum under the care of Dr. Pablo Ledger. The patient received a total dose of 3000 cGY and 12 fractions completed 01/21/2009.  #3 Maintenance chemotherapy with Alimta at 500 mg per meter squared and Avastin at 15 mg per kilogram given every 3 weeks status post 35 cycles.  #4 Maintenance chemotherapy with Alimta 500 mg/M2 and Avastin 15 mg/kg every 4 weeks,status post 17 cycles, discontinued secondary to disease progression.  #5 Systemic chemotherapy with carboplatin for AUC of 5, Alimta 500 mg/M2 and Avastin 15 mg/kg every 3 weeks, status post 3 cycles, last cycle was given 12/28/2012. Carboplatin was discontinued starting cycle #2 secondary to hypersensitivity reaction. #6  Maintenance chemotherapy again with Alimta 500 mg/M2 and Avastin 15 mg/kg every 3 weeks, first cycle 01/23/2013. Status post 6 cycles.  CURRENT THERAPY: Observation  CHEMOTHERAPY INTENT: palliative/maintenance.  CURRENT # OF CHEMOTHERAPY CYCLES: 0 CURRENT ANTIEMETICS: Compazine when necessary but clearly used.  CURRENT SMOKING STATUS: current nonsmoker  ORAL CHEMOTHERAPY AND CONSENT: None.  CURRENT BISPHOSPHONATES USE: None.  PAIN MANAGEMENT: No Pain  NARCOTICS INDUCED CONSTIPATION: N/A  LIVING WILL AND CODE STATUS: NCB   INTERVAL HISTORY:  [name] 68 y.o. male returns to the clinic today for follow up visit accompanied by his wife. The patient has been observation for the last 8 months. He has occasional  episodes of vertigo attributed it to some chronic hearing problems. He is currently under evaluation by Dr. Erik Obey and being treated for middle ear infection. He denied having any significant chest pain, cough or hemoptysis. He denied having any nausea or vomiting, no fever or chills. He has no significant weight loss or night sweats. He had repeat CT scan of the chest, abdomen and pelvis performed recently and he is here for evaluation and discussion of his scan results.  MEDICAL HISTORY: Past Medical History  Diagnosis Date  . Hypertension   . Arthritis RIGHT SHOULDER  . Immature cataract BILATERAL  . Non-small cell lung cancer DX SEPT 2010  W/ CHEMORADIATION AT THAT TIME -- NOW  W/ METS--  CURRENTLY ON MAINTENANCE CHEMO TX  EVERY 30 DAYS    ONCOLOGIST- DR [facility]  . Acute meniscal tear of knee RIGHT KNEE  . BPH (benign prostatic hypertrophy)     ALLERGIES:  is allergic to carboplatin; percocet; and zolpidem tartrate.  MEDICATIONS:  Current Outpatient Prescriptions  Medication Sig Dispense Refill  . amLODipine (NORVASC) 2.5 MG tablet Take 2.5 mg by mouth every morning.     . Ascorbic Acid (VITAMIN C) 1000 MG tablet Take 1,000 mg by mouth daily.    Marland Kitchen atenolol (TENORMIN) 100 MG tablet Take 100 mg by mouth every morning.     . B Complex-Biotin-FA (B-COMPLEX PO) Take 1 tablet by mouth daily. 1500mg  daily    . CIPRODEX otic suspension Place 5 drops into the left ear daily. X 2 weeks  5  . ciprofloxacin (CIPRO) 500 MG tablet Take 500 mg by mouth 2 (two) times daily.    Marland Kitchen FeFum-FePoly-FA-B Cmp-C-Biot (FOLIVANE-PLUS) CAPS Take 1 capsule  by mouth daily. 90 capsule 3  . HYDROcodone-homatropine (HYDROMET) 5-1.5 MG/5ML syrup Take 5 mLs by mouth every 6 (six) hours as needed for cough. 240 mL 0  . Multiple Vitamin (MULTIVITAMIN) tablet Take 1 tablet by mouth daily.     Marland Kitchen MYRBETRIQ 50 MG TB24 tablet Take 50 mg by mouth daily.    Marland Kitchen omeprazole (PRILOSEC) 40 MG capsule Take 1 capsule (40 mg total) by  mouth every evening. 90 capsule 3  . senna (SENOKOT) 8.6 MG tablet Take 1 tablet by mouth.    Orlie Dakin Sodium (SENOKOT S PO) Take 2 tablets by mouth daily. 2 tabs daily    . triamcinolone (NASACORT) 55 MCG/ACT AERO nasal inhaler Place 2 sprays into the nose daily.     No current facility-administered medications for this visit.    SURGICAL HISTORY:  Past Surgical History  Procedure Laterality Date  . 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 Rotator cuff repair  2008    LEFT SHOULDER  . Bilateral ear drum surgery  1960'S  . Orif left ring finger  and revascularization of radial side  02-16-2007    DEGLOVING INJURY  . Knee arthroscopy  12/21/2011    Procedure: ARTHROSCOPY KNEE;  Surgeon: Tobi Bastos, MD;  Location: [facility];  Service: Orthopedics;  Laterality: Right;  WITH MEDIAL MENISECTOMY    REVIEW OF SYSTEMS:  Constitutional: positive for fatigue Eyes: negative Ears, nose, mouth, throat, and face: negative Respiratory: negative Cardiovascular: negative Gastrointestinal: negative Genitourinary:negative Integument/breast: negative Hematologic/lymphatic: negative Musculoskeletal:negative Neurological: negative Behavioral/Psych: negative Endocrine: negative Allergic/Immunologic: negative   PHYSICAL EXAMINATION: General appearance: alert, cooperative, fatigued and no distress Head: Normocephalic, without obvious abnormality, atraumatic Neck: no adenopathy, no JVD, supple, symmetrical, trachea midline and thyroid not enlarged, symmetric, no tenderness/mass/nodules Lymph nodes: Cervical, supraclavicular, and axillary nodes normal. Resp: clear to auscultation bilaterally Back: symmetric, no curvature. ROM normal. No CVA tenderness. Cardio: regular rate and rhythm, S1, S2 normal, no murmur, click, rub or gallop GI: soft, non-tender; bowel sounds normal; no masses,  no  organomegaly Extremities: extremities normal, atraumatic, no cyanosis or edema Neurologic: Alert and oriented X 3, normal strength and tone. Normal symmetric reflexes. Normal coordination and gait  ECOG PERFORMANCE STATUS: 1 - Symptomatic but completely ambulatory  Blood pressure 131/81, pulse 58, temperature 97.7 F (36.5 C), temperature source Oral, resp. rate 18, height 5\' 8"  (1.727 m), weight 135 lb 3.2 oz (61.326 kg), SpO2 99 %.  LABORATORY DATA: Lab Results  Component Value Date   WBC 9.0 02/05/2014   HGB 11.8* 02/05/2014   HCT 35.6* 02/05/2014   MCV 94.5 02/05/2014   PLT 200 02/05/2014      Chemistry      Component Value Date/Time   NA 140 02/05/2014 0959   NA 140 05/25/2013 0445   K 4.3 02/05/2014 0959   K 4.2 05/25/2013 0445   CL 109 05/25/2013 0445   CL 109* 08/29/2012 0856   CO2 26 02/05/2014 0959   CO2 19 05/25/2013 0445   BUN 25.1 02/05/2014 0959   BUN 30* 05/25/2013 0445   CREATININE 1.5* 02/05/2014 0959   CREATININE 1.95* 05/25/2013 0445      Component Value Date/Time   CALCIUM 9.9 02/05/2014 0959   CALCIUM 8.7 05/25/2013 0445   ALKPHOS 92 02/05/2014 0959   ALKPHOS 55 11/18/2011 0858   AST 18 02/05/2014 0959   AST 25 11/18/2011 0858  ALT 17 02/05/2014 0959   ALT 17 11/18/2011 0858   BILITOT 0.71 02/05/2014 0959   BILITOT 0.5 11/18/2011 0858       RADIOGRAPHIC STUDIES: Ct Abdomen Pelvis Wo Contrast  02/05/2014   CLINICAL DATA:  Right-sided lung cancer diagnosed in 2010. Less chemotherapy earlier this year. Restaging.Malignant neoplasm of upper lobe of lung C34.10 (ICD-10-CM)  EXAM: CT CHEST, ABDOMEN AND PELVIS WITHOUT CONTRAST  TECHNIQUE: Multidetector CT imaging of the chest, abdomen and pelvis was performed following the standard protocol without IV contrast.  COMPARISON:  10/30/2013  FINDINGS: CT CHEST FINDINGS  Lungs/Pleura: Mild paraseptal and centrilobular emphysema.  Left apical pleural parenchymal scarring.  Calcified posterior left lower  lobe granuloma on image 49.  Right paramediastinal radiation fibrosis which is not significantly changed.  No pleural fluid.  Heart/Mediastinum: A left supraclavicular node measures 7 mm and is unchanged on image 7. Not pathologic by size criteria. Right-sided Port-A-Cath which terminates at the low SVC versus cavoatrial junction.  Aortic and branch vessel atherosclerosis. Normal heart size, without pericardial effusion. Multivessel coronary artery atherosclerosis. No change in a 1.0 cm right paratracheal node on image 19.  Hilar regions poorly evaluated without intravenous contrast.  CT ABDOMEN AND PELVIS FINDINGS  Hepatobiliary: Normal liver and gallbladder, without biliary ductal dilatation.  Pancreas: Normal, without mass or pancreatic ductal dilatation.  Spleen: Normal  Adrenals/Urinary tract: Normal adrenal glands. Mild renal cortical thinning. An interpolar left renal cyst. No hydronephrosis. No bladder calculi.  Stomach/Bowel: Normal stomach, without wall thickening. Normal colon and terminal ileum. Normal small bowel without abdominal ascites.  Vascular/Lymphatic: Advanced aortic and branch vessel atherosclerosis. Left periaortic node measures 1.3 cm on image 82 versus 1.1 cm on the prior.  A retrocaval node measures 8 mm on image 76 versus similar on the prior.  More cephalad left periaortic node measures 1.2 cm on image 75 and is newly enlarged since the prior exam. Prominent aortic caval node on image 71.  No pelvic adenopathy.  Reproductive: Mild prostatomegaly.  Other:  No significant free fluid.  Musculoskeletal: No focal osseous abnormality.  IMPRESSION: CT CHEST IMPRESSION  1. No significant change in right paramediastinal radiation fibrosis. 2. Similar borderline mediastinal adenopathy. Indeterminate. Recommend attention on follow-up. 3.  Atherosclerosis, including within the coronary arteries.  CT ABDOMEN AND PELVIS IMPRESSION  1. Progression of retroperitoneal adenopathy. As per the 04/02/2013  report, this was hypermetabolic on prior PET. This is therefore suspicious for metastatic disease. A lymphoproliferative process could look similar. 2. No other areas of metastatic disease identified within the abdomen or pelvis. 3. Advanced atherosclerosis. 4. Mild prostatomegaly.   Electronically Signed   By: Abigail Miyamoto M.D.   On: 02/05/2014 13:08   Ct Chest Wo Contrast  02/05/2014   CLINICAL DATA:  Right-sided lung cancer diagnosed in 2010. Less chemotherapy earlier this year. Restaging.Malignant neoplasm of upper lobe of lung C34.10 (ICD-10-CM)  EXAM: CT CHEST, ABDOMEN AND PELVIS WITHOUT CONTRAST  TECHNIQUE: Multidetector CT imaging of the chest, abdomen and pelvis was performed following the standard protocol without IV contrast.  COMPARISON:  10/30/2013  FINDINGS: CT CHEST FINDINGS  Lungs/Pleura: Mild paraseptal and centrilobular emphysema.  Left apical pleural parenchymal scarring.  Calcified posterior left lower lobe granuloma on image 49.  Right paramediastinal radiation fibrosis which is not significantly changed.  No pleural fluid.  Heart/Mediastinum: A left supraclavicular node measures 7 mm and is unchanged on image 7. Not pathologic by size criteria. Right-sided Port-A-Cath which terminates at the low SVC versus cavoatrial  junction.  Aortic and branch vessel atherosclerosis. Normal heart size, without pericardial effusion. Multivessel coronary artery atherosclerosis. No change in a 1.0 cm right paratracheal node on image 19.  Hilar regions poorly evaluated without intravenous contrast.  CT ABDOMEN AND PELVIS FINDINGS  Hepatobiliary: Normal liver and gallbladder, without biliary ductal dilatation.  Pancreas: Normal, without mass or pancreatic ductal dilatation.  Spleen: Normal  Adrenals/Urinary tract: Normal adrenal glands. Mild renal cortical thinning. An interpolar left renal cyst. No hydronephrosis. No bladder calculi.  Stomach/Bowel: Normal stomach, without wall thickening. Normal colon and  terminal ileum. Normal small bowel without abdominal ascites.  Vascular/Lymphatic: Advanced aortic and branch vessel atherosclerosis. Left periaortic node measures 1.3 cm on image 82 versus 1.1 cm on the prior.  A retrocaval node measures 8 mm on image 76 versus similar on the prior.  More cephalad left periaortic node measures 1.2 cm on image 75 and is newly enlarged since the prior exam. Prominent aortic caval node on image 71.  No pelvic adenopathy.  Reproductive: Mild prostatomegaly.  Other:  No significant free fluid.  Musculoskeletal: No focal osseous abnormality.  IMPRESSION: CT CHEST IMPRESSION  1. No significant change in right paramediastinal radiation fibrosis. 2. Similar borderline mediastinal adenopathy. Indeterminate. Recommend attention on follow-up. 3.  Atherosclerosis, including within the coronary arteries.  CT ABDOMEN AND PELVIS IMPRESSION  1. Progression of retroperitoneal adenopathy. As per the 04/02/2013 report, this was hypermetabolic on prior PET. This is therefore suspicious for metastatic disease. A lymphoproliferative process could look similar. 2. No other areas of metastatic disease identified within the abdomen or pelvis. 3. Advanced atherosclerosis. 4. Mild prostatomegaly.   Electronically Signed   By: Abigail Miyamoto M.D.   On: 02/05/2014 13:08   ASSESSMENT AND PLAN: This is a very pleasant 68 years old white male with metastatic non-small cell lung cancer, adenocarcinoma currently undergoing systemic chemotherapy with Alimta and Avastin status post 6 cycles. The patient tolerated his previous treatment fairly well except for the increasing fatigue and weakness as well as pancytopenia. He has been on observation for the last 8 months. He is feeling fine except for mild fatigue and vertigo. His recent CT scan of the chest, abdomen and pelvis showed stable disease in the chest but mild progression of the retroperitoneal adenopathy. I discussed the scan results with the patient and  his wife and showed them the images. To disease progression in the retroperitoneal lymph node is very minimal and you are not sure if it is related to his lung cancer or secondary to other myeloproliferative disorder. He is not interested in considering a biopsy of any of this lesion at this point. I recommended for him to continue on observation with repeat CT scan of the chest, abdomen and pelvis without contrast in 3 months. For the persistent renal insufficiency, he was seen by nephrology at [facility] and he is currently on observation and close monitoring. He was advised to call immediately if he has any concerning symptoms in the interval. The patient voices understanding of current disease status and treatment options and is in agreement with the current care plan.  All questions were answered. The patient knows to call the clinic with any problems, questions or concerns. We can certainly see the patient much sooner if necessary.  Disclaimer: This note was dictated with voice recognition software. Similar sounding words can inadvertently be transcribed and may not be corrected upon review.

## 2014-03-05 ENCOUNTER — Ambulatory Visit (HOSPITAL_BASED_OUTPATIENT_CLINIC_OR_DEPARTMENT_OTHER): Payer: Medicare Other

## 2014-03-05 DIAGNOSIS — Z452 Encounter for adjustment and management of vascular access device: Secondary | ICD-10-CM

## 2014-03-05 DIAGNOSIS — Z95828 Presence of other vascular implants and grafts: Secondary | ICD-10-CM

## 2014-03-05 DIAGNOSIS — C3411 Malignant neoplasm of upper lobe, right bronchus or lung: Secondary | ICD-10-CM

## 2014-03-05 MED ORDER — SODIUM CHLORIDE 0.9 % IJ SOLN
10.0000 mL | INTRAMUSCULAR | Status: DC | PRN
  Administered 2014-03-05: 10 mL via INTRAVENOUS
  Filled 2014-03-05: qty 10

## 2014-03-05 MED ORDER — HEPARIN SOD (PORK) LOCK FLUSH 100 UNIT/ML IV SOLN
500.0000 [IU] | Freq: Once | INTRAVENOUS | Status: AC
  Administered 2014-03-05: 500 [IU] via INTRAVENOUS
  Filled 2014-03-05: qty 5

## 2014-03-05 NOTE — Patient Instructions (Signed)

## 2014-04-09 ENCOUNTER — Ambulatory Visit (HOSPITAL_BASED_OUTPATIENT_CLINIC_OR_DEPARTMENT_OTHER): Payer: Medicare Other

## 2014-04-09 VITALS — BP 162/91 | HR 59

## 2014-04-09 DIAGNOSIS — Z95828 Presence of other vascular implants and grafts: Secondary | ICD-10-CM

## 2014-04-09 DIAGNOSIS — C3411 Malignant neoplasm of upper lobe, right bronchus or lung: Secondary | ICD-10-CM

## 2014-04-09 DIAGNOSIS — Z452 Encounter for adjustment and management of vascular access device: Secondary | ICD-10-CM

## 2014-04-09 MED ORDER — SODIUM CHLORIDE 0.9 % IJ SOLN
10.0000 mL | INTRAMUSCULAR | Status: DC | PRN
  Administered 2014-04-09: 10 mL via INTRAVENOUS
  Filled 2014-04-09: qty 10

## 2014-04-09 MED ORDER — HEPARIN SOD (PORK) LOCK FLUSH 100 UNIT/ML IV SOLN
500.0000 [IU] | Freq: Once | INTRAVENOUS | Status: AC
  Administered 2014-04-09: 500 [IU] via INTRAVENOUS
  Filled 2014-04-09: qty 5

## 2014-04-09 NOTE — Patient Instructions (Signed)

## 2014-04-18 ENCOUNTER — Other Ambulatory Visit: Payer: Self-pay | Admitting: Orthopedic Surgery

## 2014-04-18 ENCOUNTER — Ambulatory Visit (INDEPENDENT_AMBULATORY_CARE_PROVIDER_SITE_OTHER): Payer: Medicare Other

## 2014-04-18 ENCOUNTER — Other Ambulatory Visit: Payer: Medicare Other

## 2014-04-18 DIAGNOSIS — M25511 Pain in right shoulder: Secondary | ICD-10-CM

## 2014-04-18 DIAGNOSIS — R52 Pain, unspecified: Secondary | ICD-10-CM

## 2014-04-23 ENCOUNTER — Ambulatory Visit (INDEPENDENT_AMBULATORY_CARE_PROVIDER_SITE_OTHER): Payer: Medicare Other | Admitting: Neurology

## 2014-04-23 ENCOUNTER — Encounter: Payer: Self-pay | Admitting: Neurology

## 2014-04-23 VITALS — BP 143/78 | HR 58 | Ht 68.0 in | Wt 138.6 lb

## 2014-04-23 DIAGNOSIS — R251 Tremor, unspecified: Secondary | ICD-10-CM

## 2014-04-23 DIAGNOSIS — R42 Dizziness and giddiness: Secondary | ICD-10-CM

## 2014-04-23 HISTORY — DX: Tremor, unspecified: R25.1

## 2014-04-23 HISTORY — DX: Dizziness and giddiness: R42

## 2014-04-23 MED ORDER — DIAZEPAM 2 MG PO TABS: 2.0000 mg | ORAL_TABLET | Freq: Three times a day (TID) | ORAL | Status: DC

## 2014-04-23 NOTE — Progress Notes (Signed)
Reason for visit: Dizziness  [name] is a 69 y.o. male  History of present illness:  [name] is a 69 year old right-handed white male with a history of dizziness that began around April 2015. The patient had just completed chemotherapy for lung cancer 2 months prior, he was on Avastin. He had been on carboplatin previously. The patient indicated that the dizziness has been associated with rapid head movements, not any particular body position. If he turns his head side to side, looks down or looks up rapidly, he may have brief episodes of true vertigo that last only a few seconds. The patient has some sensation of a lightheaded, floaty feeling in between, and he has noted some slight gait instability, but no falls. The patient indicates that the dizziness is worse in the morning, better as the day goes on. He has not had to alter any of his activities of daily living because of the dizziness. The patient denies any slurred speech, difficulty swallowing, double vision, or focal numbness or weakness of the face, arms, or legs. Denies any issues controlling the bowels or the bladder. The patient has not had any blackout episodes. He was seen by Dr. Polly Cobia in June 0938, and he was found to have a Pseudomonas middle ear infection requiring antibiotic therapy. The patient did not improve with the dizziness. MRI evaluation of the brain was done, and this was unremarkable. The patient denies any headache, or neck stiffness. He has developed a tremor that involves the right arm over the last several months. He denies any nausea or vomiting with the dizziness. He does not relate the onset of the dizziness to any particular medication that was started or stopped around the time of onset. He is sent to this office for further evaluation.  Past Medical History  Diagnosis Date  . Hypertension   . Arthritis RIGHT SHOULDER  . Immature cataract BILATERAL  . Non-small cell lung cancer DX SEPT 2010   W/ CHEMORADIATION AT THAT TIME -- NOW  W/ METS--  CURRENTLY ON MAINTENANCE CHEMO TX  EVERY 30 DAYS    ONCOLOGIST- DR [facility]  . Acute meniscal tear of knee RIGHT KNEE  . BPH (benign prostatic hypertrophy)   . Dizziness and giddiness 04/23/2014  . Tremor 04/23/2014    Right hand  . HOH (hard of hearing)     bilateral hearing aids    Past Surgical History  Procedure Laterality Date  . 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 Rotator cuff repair  2008    LEFT SHOULDER  . Bilateral ear drum surgery  1960'S  . Orif left ring finger  and revascularization of radial side  02-16-2007    DEGLOVING INJURY  . Knee arthroscopy  12/21/2011    Procedure: ARTHROSCOPY KNEE;  Surgeon: Tobi Bastos, MD;  Location[facility];  Service: Orthopedics;  Laterality: Right;  WITH MEDIAL MENISECTOMY  . Cataract extraction Bilateral     Family History  Problem Relation Age of Onset  . Colon cancer    . Heart attack    . Cancer    . Hypertension    . Hyperlipidemia    . Congestive Heart Failure Mother   . Cancer Sister   . Cancer Maternal Grandfather   . Colon cancer Father   . Heart attack Brother     Social history:  reports that he quit smoking about 38  years ago. His smoking use included Cigarettes. He has a 40 pack-year smoking history. He has never used smokeless tobacco. He reports that he drinks about 3.5 oz of alcohol per week. He reports that he does not use illicit drugs.  Medications:  Current Outpatient Prescriptions on File Prior to Visit  Medication Sig Dispense Refill  . amLODipine (NORVASC) 2.5 MG tablet Take 2.5 mg by mouth every morning.     . Ascorbic Acid (VITAMIN C) 1000 MG tablet Take 1,000 mg by mouth daily.    Marland Kitchen atenolol (TENORMIN) 100 MG tablet Take 100 mg by mouth every morning.     . B Complex-Biotin-FA (B-COMPLEX PO) Take 1 tablet by mouth daily. 1500mg  daily    . FeFum-FePoly-FA-B  Cmp-C-Biot (FOLIVANE-PLUS) CAPS Take 1 capsule by mouth daily. 90 capsule 3  . HYDROcodone-homatropine (HYDROMET) 5-1.5 MG/5ML syrup Take 5 mLs by mouth every 6 (six) hours as needed for cough. 240 mL 0  . Multiple Vitamin (MULTIVITAMIN) tablet Take 1 tablet by mouth daily.     Marland Kitchen MYRBETRIQ 50 MG TB24 tablet Take 50 mg by mouth daily.    Marland Kitchen omeprazole (PRILOSEC) 40 MG capsule Take 1 capsule (40 mg total) by mouth every evening. 90 capsule 3  . Sennosides-Docusate Sodium (SENOKOT S PO) Take 2 tablets by mouth daily. 2 tabs daily     No current facility-administered medications on file prior to visit.      Allergies  Allergen Reactions  . Carboplatin     Rash and itching after carbo test dose  . Percocet [Oxycodone-Acetaminophen] Nausea And Vomiting  . Zolpidem Tartrate Diarrhea    ROS:  Out of a complete 14 system review of symptoms, the patient complains only of the following symptoms, and all other reviewed systems are negative.  Hearing loss, dizziness Skin rash, itching Blurred vision, double vision Urinary incontinence Easy bruising, joint pain Memory loss, dizziness Insomnia  Blood pressure 143/78, pulse 58, height 5\' 8"  (1.727 m), weight 138 lb 9.6 oz (62.869 kg).   Blood pressure, sitting, right arm is 164/84. Blood pressure, standing, right arm is 144/84.  Physical Exam  General: The patient is alert and cooperative at the time of the examination.  Eyes: Pupils are equal, round, and reactive to light. Discs are flat bilaterally.   Ears: Tympanic membranes are sclerotic bilaterally, left greater than right. The patient wears hearing aids bilaterally.  Neck: The neck is supple, no carotid bruits are noted.  Respiratory: The respiratory examination is clear.  Cardiovascular: The cardiovascular examination reveals a regular rate and rhythm, no obvious murmurs or rubs are noted.  Skin: Extremities are without significant edema.  Neurologic Exam  Mental status:  The patient is alert and oriented x 3 at the time of the examination. The patient has apparent normal recent and remote memory, with an apparently normal attention span and concentration ability.  Cranial nerves: Facial symmetry is present. There is good sensation of the face to pinprick and soft touch bilaterally on the forehead, decreased on the left lower face. The strength of the facial muscles and the muscles to head turning and shoulder shrug are normal bilaterally. Speech is well enunciated, no aphasia or dysarthria is noted. Extraocular movements are full. Visual fields are full. The tongue is midline, and the patient has symmetric elevation of the soft palate. No obvious hearing deficits are noted.  Motor: The motor testing reveals 5 over 5 strength of all 4 extremities. Good symmetric motor tone is noted throughout.  Sensory:  Sensory testing is intact to pinprick, soft touch, vibration sensation, and position sense on all 4 extremities, with exception that there is some decrease in pinprick sensation on the left arm. No evidence of extinction is noted.  Coordination: Cerebellar testing reveals good finger-nose-finger and heel-to-shin bilaterally. The Nyan-Barrany procedure revealed bilateral horizontal and rotatory nystagmus with the supine posture. No single head position induced nystagmus and dizziness. An intention tremor is seen with the right upper extremity greater than the left upper extremity.  Gait and station: Gait is normal. Tandem gait is unsteady. Romberg is negative. No drift is seen.  Reflexes: Deep tendon reflexes are symmetric and normal bilaterally. Toes are downgoing bilaterally.   MRI brain 12/13/13:  IMPRESSION:  Negative for metastatic disease to brain  No cause for hearing loss.    Assessment/Plan:  1. Dizziness   2. Right upper extremity tremor  The patient has had onset of dizziness. He was treated with antibiotics for a Pseudomonas infection of the  left ear, but this did not improve his symptoms. The patient has had MRI evaluation of the brain that was unremarkable. Clinical examination today does show some horizontal and rotatory nystagmus, without any single head position that brings on the dizziness. Any rapid head movement may induce dizziness. The patient likely has a peripheral source of his symptoms. The patient will be given a trial on diazepam, he claims that meclizine has not helped previously. The patient will undergo blood work evaluation today. He will follow-up in 4 months. Treatment of this dizziness may be difficult. Vestibular rehabilitation may be considered in the future.   Jill Alexanders MD 04/23/2014 6:50 PM  [facility] [address] [place], [place] [address]  Phone [phone] Fax [phone]

## 2014-04-23 NOTE — Patient Instructions (Signed)

## 2014-04-24 LAB — COPPER, SERUM

## 2014-04-24 LAB — SPECIMEN STATUS REPORT

## 2014-04-29 ENCOUNTER — Telehealth: Payer: Self-pay

## 2014-04-29 ENCOUNTER — Telehealth: Payer: Self-pay | Admitting: Pulmonary Disease

## 2014-04-29 LAB — VITAMIN B12: VITAMIN B 12: 494 pg/mL (ref 211–946)

## 2014-04-29 LAB — ANGIOTENSIN CONVERTING ENZYME: ANGIO CONVERT ENZYME: 55 U/L (ref 14–82)

## 2014-04-29 LAB — TSH: TSH: 2.91 u[IU]/mL (ref 0.450–4.500)

## 2014-04-29 LAB — ANA W/REFLEX: Anti Nuclear Antibody(ANA): NEGATIVE

## 2014-04-29 NOTE — Telephone Encounter (Signed)
LMOVM - returned pt call re: port.  Pt to return call to clinic.

## 2014-04-29 NOTE — Telephone Encounter (Signed)
Called [name] W/ Dr. Lorie Apley office. Pt is scheduled to have colonoscopy on 05/01/14. Wants okay to sedate pt for this. Please advise RA thanks  Fax # [phone]

## 2014-04-30 ENCOUNTER — Telehealth: Payer: Self-pay | Admitting: *Deleted

## 2014-04-30 ENCOUNTER — Encounter: Payer: Self-pay | Admitting: *Deleted

## 2014-04-30 NOTE — Telephone Encounter (Signed)
OK to proceed with due risk

## 2014-04-30 NOTE — Telephone Encounter (Signed)
Call received to [facility]AGE from pt inquiring about obtaining MRI per Dr Gladstone Lighter  " and he stated concern about the port interfering with the MRI "  " he mentioned something about the it being made of metal "  Pt has had port since 2010 and has had previous MRI without problems.  This RN informed pt ports are not an issue with MRIs.  No further concerns or questions.

## 2014-04-30 NOTE — Telephone Encounter (Signed)
Spoke with [employer]. Gave her the verbal per RA. States that she needs something sent over stating this. Will draft letter and have this sent to [phone].

## 2014-05-14 ENCOUNTER — Other Ambulatory Visit (HOSPITAL_BASED_OUTPATIENT_CLINIC_OR_DEPARTMENT_OTHER): Payer: Medicare Other

## 2014-05-14 ENCOUNTER — Ambulatory Visit (HOSPITAL_BASED_OUTPATIENT_CLINIC_OR_DEPARTMENT_OTHER): Payer: Medicare Other

## 2014-05-14 ENCOUNTER — Encounter (HOSPITAL_COMMUNITY): Payer: Self-pay

## 2014-05-14 ENCOUNTER — Ambulatory Visit (HOSPITAL_COMMUNITY)
Admission: RE | Admit: 2014-05-14 | Discharge: 2014-05-14 | Disposition: A | Discharge status: Home / Self Care | Payer: Medicare Other | Source: Ambulatory Visit | Attending: Internal Medicine | Admitting: Internal Medicine

## 2014-05-14 VITALS — BP 139/85 | HR 59 | Temp 97.8°F

## 2014-05-14 DIAGNOSIS — Z452 Encounter for adjustment and management of vascular access device: Secondary | ICD-10-CM

## 2014-05-14 DIAGNOSIS — C3492 Malignant neoplasm of unspecified part of left bronchus or lung: Secondary | ICD-10-CM | POA: Diagnosis not present

## 2014-05-14 DIAGNOSIS — Z95828 Presence of other vascular implants and grafts: Secondary | ICD-10-CM

## 2014-05-14 DIAGNOSIS — Z9221 Personal history of antineoplastic chemotherapy: Secondary | ICD-10-CM | POA: Diagnosis not present

## 2014-05-14 DIAGNOSIS — C3410 Malignant neoplasm of upper lobe, unspecified bronchus or lung: Secondary | ICD-10-CM

## 2014-05-14 LAB — CBC WITH DIFFERENTIAL/PLATELET
BASO%: 0.4 % (ref 0.0–2.0)
Basophils Absolute: 0 10*3/uL (ref 0.0–0.1)
EOS%: 5.2 % (ref 0.0–7.0)
Eosinophils Absolute: 0.4 10*3/uL (ref 0.0–0.5)
HCT: 34.9 % — ABNORMAL LOW (ref 38.4–49.9)
HGB: 11.7 g/dL — ABNORMAL LOW (ref 13.0–17.1)
LYMPH#: 1.6 10*3/uL (ref 0.9–3.3)
LYMPH%: 23.4 % (ref 14.0–49.0)
MCH: 31.3 pg (ref 27.2–33.4)
MCHC: 33.5 g/dL (ref 32.0–36.0)
MCV: 93.3 fL (ref 79.3–98.0)
MONO#: 0.8 10*3/uL (ref 0.1–0.9)
MONO%: 11.3 % (ref 0.0–14.0)
NEUT%: 59.7 % (ref 39.0–75.0)
NEUTROS ABS: 4.1 10*3/uL (ref 1.5–6.5)
PLATELETS: 159 10*3/uL (ref 140–400)
RBC: 3.74 10*6/uL — ABNORMAL LOW (ref 4.20–5.82)
RDW: 13.7 % (ref 11.0–14.6)
WBC: 6.9 10*3/uL (ref 4.0–10.3)

## 2014-05-14 LAB — COMPREHENSIVE METABOLIC PANEL (CC13)
ALBUMIN: 3.7 g/dL (ref 3.5–5.0)
ALT: 22 U/L (ref 0–55)
AST: 20 U/L (ref 5–34)
Alkaline Phosphatase: 104 U/L (ref 40–150)
Anion Gap: 10 mEq/L (ref 3–11)
BUN: 22.5 mg/dL (ref 7.0–26.0)
CHLORIDE: 107 meq/L (ref 98–109)
CO2: 24 mEq/L (ref 22–29)
Calcium: 9.7 mg/dL (ref 8.4–10.4)
Creatinine: 1.5 mg/dL — ABNORMAL HIGH (ref 0.7–1.3)
EGFR: 48 mL/min/{1.73_m2} — ABNORMAL LOW (ref >90)
GLUCOSE: 104 mg/dL (ref 70–140)
Potassium: 4.2 mEq/L (ref 3.5–5.1)
Sodium: 141 mEq/L (ref 136–145)
Total Bilirubin: 0.85 mg/dL (ref 0.20–1.20)
Total Protein: 7.1 g/dL (ref 6.4–8.3)

## 2014-05-14 MED ORDER — SODIUM CHLORIDE 0.9 % IJ SOLN
10.0000 mL | INTRAMUSCULAR | Status: DC | PRN
  Administered 2014-05-14: 10 mL via INTRAVENOUS
  Filled 2014-05-14: qty 10

## 2014-05-14 MED ORDER — HEPARIN SOD (PORK) LOCK FLUSH 100 UNIT/ML IV SOLN
500.0000 [IU] | Freq: Once | INTRAVENOUS | Status: AC
  Administered 2014-05-14: 500 [IU] via INTRAVENOUS
  Filled 2014-05-14: qty 5

## 2014-05-14 NOTE — Patient Instructions (Signed)

## 2014-05-20 ENCOUNTER — Telehealth: Payer: Self-pay | Admitting: Pulmonary Disease

## 2014-05-20 NOTE — Telephone Encounter (Signed)
Spoke with pt, states he is having rotator cuff sx, but orthopedist states pt needs clearance from either PCP or RA for this.  Sx isn't scheduled yet but is expected within the next 2-3 weeks.  I advised pt that RA's next available appt isn't until 4/7 in [place] and 4/11 in [place].  I advised we can get pt in to see TP in a reasonable time for sx clearance if needed.  Pt states he'd prefer to go through his PCP at this time but wanted to know what would be needed on our end.  Nothing further needed at this time.

## 2014-05-21 ENCOUNTER — Encounter: Payer: Self-pay | Admitting: Internal Medicine

## 2014-05-21 ENCOUNTER — Ambulatory Visit (HOSPITAL_BASED_OUTPATIENT_CLINIC_OR_DEPARTMENT_OTHER): Payer: Medicare Other | Admitting: Internal Medicine

## 2014-05-21 ENCOUNTER — Telehealth: Payer: Self-pay | Admitting: Internal Medicine

## 2014-05-21 VITALS — BP 126/75 | HR 58 | Temp 97.7°F | Resp 18 | Ht 68.0 in | Wt 140.1 lb

## 2014-05-21 DIAGNOSIS — C3492 Malignant neoplasm of unspecified part of left bronchus or lung: Secondary | ICD-10-CM

## 2014-05-21 DIAGNOSIS — C3411 Malignant neoplasm of upper lobe, right bronchus or lung: Secondary | ICD-10-CM

## 2014-05-21 NOTE — Progress Notes (Signed)
[place] [phone]   Fax:([phone]) Victor, MD [address] [place] [address]  DIAGNOSIS AND DIAGNOSIS: Metastatic non-small cell lung cancer adenocarcinoma diagnosed in September 2010   PRIOR THERAPY:  #1 status post 6 cycles of systemic chemotherapy with carboplatin, Alimta and Avastin given every 3 weeks last dose given 04/23/2009 with disease stabilization.  #2 status post palliative radiotherapy to the mediastinum under the care of Dr. Pablo Ledger. The patient received a total dose of 3000 cGY and 12 fractions completed 01/21/2009.  #3 Maintenance chemotherapy with Alimta at 500 mg per meter squared and Avastin at 15 mg per kilogram given every 3 weeks status post 35 cycles.  #4 Maintenance chemotherapy with Alimta 500 mg/M2 and Avastin 15 mg/kg every 4 weeks,status post 17 cycles, discontinued secondary to disease progression.  #5 Systemic chemotherapy with carboplatin for AUC of 5, Alimta 500 mg/M2 and Avastin 15 mg/kg every 3 weeks, status post 3 cycles, last cycle was given 12/28/2012. Carboplatin was discontinued starting cycle #2 secondary to hypersensitivity reaction. #6  Maintenance chemotherapy again with Alimta 500 mg/M2 and Avastin 15 mg/kg every 3 weeks, first cycle 01/23/2013. Status post 6 cycles.  CURRENT THERAPY: Observation  CHEMOTHERAPY INTENT: palliative/maintenance.  CURRENT # OF CHEMOTHERAPY CYCLES: 0 CURRENT ANTIEMETICS: Compazine when necessary but clearly used.  CURRENT SMOKING STATUS: current nonsmoker  ORAL CHEMOTHERAPY AND CONSENT: None.  CURRENT BISPHOSPHONATES USE: None.  PAIN MANAGEMENT: No Pain  NARCOTICS INDUCED CONSTIPATION: N/A  LIVING WILL AND CODE STATUS: NCB   INTERVAL HISTORY:  [name] 69 y.o. male returns to the clinic today for follow up visit accompanied by his wife. The patient has a right shoulder pain and he is expected to have rotator cuff  surgery soon. He denied having any significant chest pain, cough or hemoptysis. He denied having any nausea or vomiting, no fever or chills. He has no significant weight loss or night sweats. He had repeat CT scan of the chest, abdomen and pelvis performed recently and he is here for evaluation and discussion of his scan results.   MEDICAL HISTORY: Past Medical History  Diagnosis Date  . Hypertension   . Arthritis RIGHT SHOULDER  . Immature cataract BILATERAL  . Non-small cell lung cancer DX SEPT 2010  W/ CHEMORADIATION AT THAT TIME -- NOW  W/ METS--  CURRENTLY ON MAINTENANCE CHEMO TX  EVERY 30 DAYS    ONCOLOGIST- DR [facility]  . Acute meniscal tear of knee RIGHT KNEE  . BPH (benign prostatic hypertrophy)   . Dizziness and giddiness 04/23/2014  . Tremor 04/23/2014    Right hand  . HOH (hard of hearing)     bilateral hearing aids    ALLERGIES:  is allergic to carboplatin; percocet; and zolpidem tartrate.  MEDICATIONS:  Current Outpatient Prescriptions  Medication Sig Dispense Refill  . amLODipine (NORVASC) 2.5 MG tablet Take 2.5 mg by mouth every morning.     . Ascorbic Acid (VITAMIN C) 1000 MG tablet Take 1,000 mg by mouth daily.    Marland Kitchen atenolol (TENORMIN) 100 MG tablet Take 100 mg by mouth every morning.     . B Complex-Biotin-FA (B-COMPLEX PO) Take 1 tablet by mouth daily. 1500mg  daily    . diazepam (VALIUM) 2 MG tablet Take 1 tablet (2 mg total) by mouth 3 (three) times daily. 90 tablet 1  . FeFum-FePoly-FA-B Cmp-C-Biot (FOLIVANE-PLUS) CAPS Take 1 capsule by mouth daily. 90 capsule 3  .  HYDROcodone-homatropine (HYDROMET) 5-1.5 MG/5ML syrup Take 5 mLs by mouth every 6 (six) hours as needed for cough. 240 mL 0  . Multiple Vitamin (MULTIVITAMIN) tablet Take 1 tablet by mouth daily.     Marland Kitchen MYRBETRIQ 50 MG TB24 tablet Take 50 mg by mouth daily.    Marland Kitchen omeprazole (PRILOSEC) 40 MG capsule Take 1 capsule (40 mg total) by mouth every evening. 90 capsule 3  . Sennosides-Docusate Sodium (SENOKOT S  PO) Take 2 tablets by mouth daily. 2 tabs daily     No current facility-administered medications for this visit.    SURGICAL HISTORY:  Past Surgical History  Procedure Laterality Date  . 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 Rotator cuff repair  2008    LEFT SHOULDER  . Bilateral ear drum surgery  1960'S  . Orif left ring finger  and revascularization of radial side  02-16-2007    DEGLOVING INJURY  . Knee arthroscopy  12/21/2011    Procedure: ARTHROSCOPY KNEE;  Surgeon: Tobi Bastos, MD;  Location: [facility];  Service: Orthopedics;  Laterality: Right;  WITH MEDIAL MENISECTOMY  . Cataract extraction Bilateral     REVIEW OF SYSTEMS:  A comprehensive review of systems was negative.   PHYSICAL EXAMINATION: General appearance: alert, cooperative, fatigued and no distress Head: Normocephalic, without obvious abnormality, atraumatic Neck: no adenopathy, no JVD, supple, symmetrical, trachea midline and thyroid not enlarged, symmetric, no tenderness/mass/nodules Lymph nodes: Cervical, supraclavicular, and axillary nodes normal. Resp: clear to auscultation bilaterally Back: symmetric, no curvature. ROM normal. No CVA tenderness. Cardio: regular rate and rhythm, S1, S2 normal, no murmur, click, rub or gallop GI: soft, non-tender; bowel sounds normal; no masses,  no organomegaly Extremities: extremities normal, atraumatic, no cyanosis or edema Neurologic: Alert and oriented X 3, normal strength and tone. Normal symmetric reflexes. Normal coordination and gait  ECOG PERFORMANCE STATUS: 1 - Symptomatic but completely ambulatory  Blood pressure 126/75, pulse 58, temperature 97.7 F (36.5 C), temperature source Oral, resp. rate 18, height 5\' 8"  (1.727 m), weight 140 lb 1.6 oz (63.549 kg), SpO2 100 %.  LABORATORY DATA: Lab Results  Component Value Date   WBC 6.9 05/14/2014   HGB 11.7* 05/14/2014   HCT  34.9* 05/14/2014   MCV 93.3 05/14/2014   PLT 159 05/14/2014      Chemistry      Component Value Date/Time   NA 141 05/14/2014 1021   NA 140 05/25/2013 0445   K 4.2 05/14/2014 1021   K 4.2 05/25/2013 0445   CL 109 05/25/2013 0445   CL 109* 08/29/2012 0856   CO2 24 05/14/2014 1021   CO2 19 05/25/2013 0445   BUN 22.5 05/14/2014 1021   BUN 30* 05/25/2013 0445   CREATININE 1.5* 05/14/2014 1021   CREATININE 1.95* 05/25/2013 0445      Component Value Date/Time   CALCIUM 9.7 05/14/2014 1021   CALCIUM 8.7 05/25/2013 0445   ALKPHOS 104 05/14/2014 1021   ALKPHOS 55 11/18/2011 0858   AST 20 05/14/2014 1021   AST 25 11/18/2011 0858   ALT 22 05/14/2014 1021   ALT 17 11/18/2011 0858   BILITOT 0.85 05/14/2014 1021   BILITOT 0.5 11/18/2011 0858       RADIOGRAPHIC STUDIES: Ct Abdomen Pelvis Wo Contrast  05/14/2014   CLINICAL DATA:  Followup non-small cell left lung carcinoma. Status post chemotherapy.  EXAM:  CT CHEST, ABDOMEN AND PELVIS  WITHOUT CONTRAST  TECHNIQUE: Multidetector CT imaging of the chest, abdomen and pelvis was performed following the standard protocol without IV contrast.  COMPARISON:  02/05/2014  FINDINGS: CT CHEST FINDINGS  Mediastinum/Lymph Nodes: Mild lymphadenopathy in right paratracheal region shows no significant change with largest lymph node measuring approximately 11 mm. No definite hilar lymphadenopathy seen on this noncontrast study. No other sites of thoracic lymphadenopathy identified.  Lungs/Pleura: Mild paramediastinal radiation changes are stable. No suspicious pulmonary nodules or masses are identified. No evidence of pulmonary infiltrate or pleural effusion.  Musculoskeletal/Soft Tissues: No suspicious bone lesions or other significant chest wall abnormality.  CT ABDOMEN AND PELVIS FINDINGS  Hepatobiliary: No masses or other significant abnormality identified without IV contrast.  Pancreas: No mass, inflammatory changes, or other significant abnormality  identified without IV contrast.  Spleen:  Within normal limits in size.  Adrenals:  No masses identified.  Kidneys/Urinary Tract: No evidence of urolithiasis or hydronephrosis.  Stomach/Bowel/Peritoneum: No evidence of wall thickening, mass, or obstruction.  Vascular/Lymphatic: Mild retroperitoneal lymphadenopathy in the aorta caval and left paraaortic space is shows no significant change, with largest lymph nodes measuring 12 mm. No new sites of lymphadenopathy identified within the abdomen or pelvis.  Reproductive:  No mass or other significant abnormality identified.  Other:  None.  Musculoskeletal:  No suspicious bone lesions identified.  IMPRESSION: Stable mild mediastinal lymphadenopathy.  Stable mild abdominal retroperitoneal lymphadenopathy.  No new or progressive disease identified within the chest, abdomen or pelvis.   Electronically Signed   By: Earle Gell M.D.   On: 05/14/2014 13:22   Ct Chest Wo Contrast  05/14/2014   CLINICAL DATA:  Followup non-small cell left lung carcinoma. Status post chemotherapy.  EXAM:  CT CHEST, ABDOMEN AND PELVIS WITHOUT CONTRAST  TECHNIQUE: Multidetector CT imaging of the chest, abdomen and pelvis was performed following the standard protocol without IV contrast.  COMPARISON:  02/05/2014  FINDINGS: CT CHEST FINDINGS  Mediastinum/Lymph Nodes: Mild lymphadenopathy in right paratracheal region shows no significant change with largest lymph node measuring approximately 11 mm. No definite hilar lymphadenopathy seen on this noncontrast study. No other sites of thoracic lymphadenopathy identified.  Lungs/Pleura: Mild paramediastinal radiation changes are stable. No suspicious pulmonary nodules or masses are identified. No evidence of pulmonary infiltrate or pleural effusion.  Musculoskeletal/Soft Tissues: No suspicious bone lesions or other significant chest wall abnormality.  CT ABDOMEN AND PELVIS FINDINGS  Hepatobiliary: No masses or other significant abnormality identified  without IV contrast.  Pancreas: No mass, inflammatory changes, or other significant abnormality identified without IV contrast.  Spleen:  Within normal limits in size.  Adrenals:  No masses identified.  Kidneys/Urinary Tract: No evidence of urolithiasis or hydronephrosis.  Stomach/Bowel/Peritoneum: No evidence of wall thickening, mass, or obstruction.  Vascular/Lymphatic: Mild retroperitoneal lymphadenopathy in the aorta caval and left paraaortic space is shows no significant change, with largest lymph nodes measuring 12 mm. No new sites of lymphadenopathy identified within the abdomen or pelvis.  Reproductive:  No mass or other significant abnormality identified.  Other:  None.  Musculoskeletal:  No suspicious bone lesions identified.  IMPRESSION: Stable mild mediastinal lymphadenopathy.  Stable mild abdominal retroperitoneal lymphadenopathy.  No new or progressive disease identified within the chest, abdomen or pelvis.   Electronically Signed   By: Earle Gell M.D.   On: 05/14/2014 13:22   ASSESSMENT AND PLAN: This is a very pleasant 69 years old white male with metastatic non-small cell lung cancer, adenocarcinoma underwent several chemotherapy regimens including recent treatment with  systemic chemotherapy with Alimta and Avastin status post 6 cycles. The patient tolerated his previous treatment fairly well except for the increasing fatigue and weakness as well as pancytopenia. He has been observation for the last 12 months. The patient is feeling fine and the recent CT scan of the chest, abdomen and pelvis showed no evidence for disease progression. I discussed the scan results with the patient and his wife. I recommended for him to continue on observation with repeat CT scan of the chest, abdomen and pelvis without contrast in 3 months. He was advised to call immediately if he has any concerning symptoms in the interval. The patient voices understanding of current disease status and treatment options and  is in agreement with the current care plan.  All questions were answered. The patient knows to call the clinic with any problems, questions or concerns. We can certainly see the patient much sooner if necessary.  Disclaimer: This note was dictated with voice recognition software. Similar sounding words can inadvertently be transcribed and may not be corrected upon review.

## 2014-05-21 NOTE — Telephone Encounter (Signed)
Pt confirmed labs/ov per 02/23 POF, gave pt AVS... KJ, pt request water base not barium.Marland KitchenMarland KitchenMarland Kitchen

## 2014-06-03 ENCOUNTER — Encounter (HOSPITAL_COMMUNITY): Payer: Self-pay

## 2014-06-03 ENCOUNTER — Encounter (HOSPITAL_COMMUNITY)
Admission: RE | Admit: 2014-06-03 | Discharge: 2014-06-03 | Disposition: A | Discharge status: Home / Self Care | Payer: Medicare Other | Source: Ambulatory Visit | Attending: Orthopedic Surgery | Admitting: Orthopedic Surgery

## 2014-06-03 ENCOUNTER — Other Ambulatory Visit: Payer: Self-pay | Admitting: Surgical

## 2014-06-03 DIAGNOSIS — M75101 Unspecified rotator cuff tear or rupture of right shoulder, not specified as traumatic: Secondary | ICD-10-CM | POA: Diagnosis not present

## 2014-06-03 DIAGNOSIS — N400 Enlarged prostate without lower urinary tract symptoms: Secondary | ICD-10-CM | POA: Diagnosis not present

## 2014-06-03 DIAGNOSIS — K219 Gastro-esophageal reflux disease without esophagitis: Secondary | ICD-10-CM | POA: Diagnosis not present

## 2014-06-03 DIAGNOSIS — I10 Essential (primary) hypertension: Secondary | ICD-10-CM | POA: Diagnosis not present

## 2014-06-03 DIAGNOSIS — Z881 Allergy status to other antibiotic agents status: Secondary | ICD-10-CM | POA: Diagnosis not present

## 2014-06-03 DIAGNOSIS — M7541 Impingement syndrome of right shoulder: Secondary | ICD-10-CM | POA: Diagnosis not present

## 2014-06-03 DIAGNOSIS — Z85118 Personal history of other malignant neoplasm of bronchus and lung: Secondary | ICD-10-CM | POA: Diagnosis not present

## 2014-06-03 DIAGNOSIS — Z87891 Personal history of nicotine dependence: Secondary | ICD-10-CM | POA: Diagnosis not present

## 2014-06-03 DIAGNOSIS — Z888 Allergy status to other drugs, medicaments and biological substances status: Secondary | ICD-10-CM | POA: Diagnosis not present

## 2014-06-03 DIAGNOSIS — R0602 Shortness of breath: Secondary | ICD-10-CM | POA: Diagnosis not present

## 2014-06-03 DIAGNOSIS — Z886 Allergy status to analgesic agent status: Secondary | ICD-10-CM | POA: Diagnosis not present

## 2014-06-03 LAB — PROTIME-INR
INR: 1.17 (ref 0.00–1.49)
Prothrombin Time: 15 seconds (ref 11.6–15.2)

## 2014-06-03 LAB — URINALYSIS, ROUTINE W REFLEX MICROSCOPIC
Bilirubin Urine: NEGATIVE
Glucose, UA: NEGATIVE mg/dL
Hgb urine dipstick: NEGATIVE
Ketones, ur: NEGATIVE mg/dL
Leukocytes, UA: NEGATIVE
Nitrite: NEGATIVE
Protein, ur: NEGATIVE mg/dL
Specific Gravity, Urine: 1.012 (ref 1.005–1.030)
Urobilinogen, UA: 0.2 mg/dL (ref 0.0–1.0)
pH: 5.5 (ref 5.0–8.0)

## 2014-06-03 LAB — APTT: aPTT: 37 seconds (ref 24–37)

## 2014-06-03 NOTE — Patient Instructions (Addendum)
[name]  06/03/2014   Your procedure is scheduled on: [facility] 06/05/2014  Report to [name].  Call this number if you have problems the morning of surgery [phone]   Remember:  Do not eat food  :After Midnight. MAY HAVE CLEAR LIQUIDS FROM MIDNIGHT UP UNTIL 0730 AM THEN NOTHING UNTIL AFTER SURGERY!     Take these medicines the morning of surgery with A SIP OF WATER: Amlodipine, Atenolol                               You may not have any metal on your body including hair pins and              piercings  Do not wear jewelry, make-up, lotions, powders or perfumes.             Do not wear nail polish.  Do not shave  48 hours prior to surgery.              Men may shave face and neck.   Do not bring valuables to the hospital. [place].  Contacts, dentures or bridgework may not be worn into surgery.  Leave suitcase in the car. After surgery it may be brought to your room.     Patients discharged the day of surgery will not be allowed to drive home.  Name and phone number of your driver:  Special Instructions: N/A              Please read over the following fact sheets you were given: _____________________________________________________________________             [name] Before surgery, you can play an important role.  Because skin is not sterile, your skin needs to be as free of germs as possible.  You can reduce the number of germs on your skin by washing with CHG (chlorahexidine gluconate) soap before surgery.  CHG is an antiseptic cleaner which kills germs and bonds with the skin to continue killing germs even after washing. Please DO NOT use if you have an allergy to CHG or antibacterial soaps.  If your skin becomes reddened/irritated stop using the CHG and inform your nurse when you  arrive at Short Stay. Do not shave (including legs and underarms) for at least 48 hours prior to the first CHG shower.  You may shave your face/neck. Please follow these instructions carefully:  1.  Shower with CHG Soap the night before surgery and the  morning of Surgery.  2.  If you choose to wash your hair, wash your hair first as usual with your  normal  shampoo.  3.  After you shampoo, rinse your hair and body thoroughly to remove the  shampoo.                           4.  Use CHG as you would any other liquid soap.  You can apply chg directly  to the skin and wash  Gently with a scrungie or clean washcloth.  5.  Apply the CHG Soap to your body ONLY FROM THE NECK DOWN.   Do not use on face/ open                           Wound or open sores. Avoid contact with eyes, ears mouth and genitals (private parts).                       Wash face,  Genitals (private parts) with your normal soap.             6.  Wash thoroughly, paying special attention to the area where your surgery  will be performed.  7.  Thoroughly rinse your body with warm water from the neck down.  8.  DO NOT shower/wash with your normal soap after using and rinsing off  the CHG Soap.                9.  Pat yourself dry with a clean towel.            10.  Wear clean pajamas.            11.  Place clean sheets on your bed the night of your first shower and do not  sleep with pets. Day of Surgery : Do not apply any lotions/deodorants the morning of surgery.  Please wear clean clothes to the hospital/surgery center.  FAILURE TO FOLLOW THESE INSTRUCTIONS MAY RESULT IN THE CANCELLATION OF YOUR SURGERY PATIENT SIGNATURE_________________________________  NURSE SIGNATURE__________________________________  ________________________________________________________________________   [name]  An incentive spirometer is a tool that can help keep your lungs clear and active. This tool measures how  well you are filling your lungs with each breath. Taking long deep breaths may help reverse or decrease the chance of developing breathing (pulmonary) problems (especially infection) following:  A long period of time when you are unable to move or be active. BEFORE THE PROCEDURE   If the spirometer includes an indicator to show your best effort, your nurse or respiratory therapist will set it to a desired goal.  If possible, sit up straight or lean slightly forward. Try not to slouch.  Hold the incentive spirometer in an upright position. INSTRUCTIONS FOR USE  1. Sit on the edge of your bed if possible, or sit up as far as you can in bed or on a chair. 2. Hold the incentive spirometer in an upright position. 3. Breathe out normally. 4. Place the mouthpiece in your mouth and seal your lips tightly around it. 5. Breathe in slowly and as deeply as possible, raising the piston or the ball toward the top of the column. 6. Hold your breath for 3-5 seconds or for as long as possible. Allow the piston or ball to fall to the bottom of the column. 7. Remove the mouthpiece from your mouth and breathe out normally. 8. Rest for a few seconds and repeat Steps 1 through 7 at least 10 times every 1-2 hours when you are awake. Take your time and take a few normal breaths between deep breaths. 9. The spirometer may include an indicator to show your best effort. Use the indicator as a goal to work toward during each repetition. 10. After each set of 10 deep breaths, practice coughing to be sure your lungs are clear. If you have an incision (the cut made at the time of surgery),  support your incision when coughing by placing a pillow or rolled up towels firmly against it. Once you are able to get out of bed, walk around indoors and cough well. You may stop using the incentive spirometer when instructed by your caregiver.  RISKS AND COMPLICATIONS  Take your time so you do not get dizzy or light-headed.  If you  are in pain, you may need to take or ask for pain medication before doing incentive spirometry. It is harder to take a deep breath if you are having pain. AFTER USE  Rest and breathe slowly and easily.  It can be helpful to keep track of a log of your progress. Your caregiver can provide you with a simple table to help with this. If you are using the spirometer at home, follow these instructions: [place] IF:   You are having difficultly using the spirometer.  You have trouble using the spirometer as often as instructed.  Your pain medication is not giving enough relief while using the spirometer.  You develop fever of 100.5 F (38.1 C) or higher. SEEK IMMEDIATE MEDICAL CARE IF:   You cough up bloody sputum that had not been present before.  You develop fever of 102 F (38.9 C) or greater.  You develop worsening pain at or near the incision site. MAKE SURE YOU:   Understand these instructions.  Will watch your condition.  Will get help right away if you are not doing well or get worse. Document Released: 07/26/2006 Document Revised: 06/07/2011 Document Reviewed: 09/26/2006 ExitCare Patient Information 2014 [place].   ________________________________________________________________________    CLEAR LIQUID DIET   Foods Allowed                                                                     Foods Excluded  Coffee and tea, regular and decaf                             liquids that you cannot  Plain Jell-O in any flavor                                             see through such as: Fruit ices (not with fruit pulp)                                     milk, soups, orange juice  Iced Popsicles                                    All solid food Carbonated beverages, regular and diet                                    Cranberry, grape and apple juices Sports drinks like Gatorade Lightly seasoned clear broth or consume(fat free) Sugar, honey  syrup  Sample Menu Breakfast  Lunch                                     Supper Cranberry juice                    Beef broth                            Chicken broth Jell-O                                     Grape juice                           Apple juice Coffee or tea                        Jell-O                                      Popsicle                                                Coffee or tea                        Coffee or tea  _____________________________________________________________________

## 2014-06-03 NOTE — Progress Notes (Addendum)
05/23/2014-EKG from [facility] (Dr. [name]) on chart along with last office note which includes pre-operative clearance in note.

## 2014-06-03 NOTE — Progress Notes (Signed)
Pleas put order in [place] [facility] 06-05-14 pre op 06-03-14 Thanks

## 2014-06-04 NOTE — H&P (Signed)
[name] is an 69 y.o. male.   Chief Complaint: right shoulder pain HPI: The patient presented with the chief complaint of right shoulder pain. He reports that his right shoulder has given him trouble over the past five years. No specific injury. He reports that in the past few months the pain has worsened. No change in activity. He reports pain and stiffness in the right shoulder. No numbness or tingling in the arm. MRI of the right shoulder showed a tear of the right rotator cuff. He has a history of lung cancer.  Past Medical History  Diagnosis Date  . Hypertension   . Arthritis RIGHT SHOULDER  . Immature cataract BILATERAL  . Non-small cell lung cancer DX SEPT 2010  W/ CHEMORADIATION AT THAT TIME -- NOW  W/ METS--  CURRENTLY ON MAINTENANCE CHEMO TX  EVERY 30 DAYS    ONCOLOGIST- DR [facility]  . Acute meniscal tear of knee RIGHT KNEE  . BPH (benign prostatic hypertrophy)   . Dizziness and giddiness 04/23/2014  . Tremor 04/23/2014    Right hand  . HOH (hard of hearing)     bilateral hearing aids    Past Surgical History  Procedure Laterality Date  . 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 Rotator cuff repair  2008    LEFT SHOULDER  . Bilateral ear drum surgery  1960'S  . Orif left ring finger  and revascularization of radial side  02-16-2007    DEGLOVING INJURY  . Knee arthroscopy  12/21/2011    Procedure: ARTHROSCOPY KNEE;  Surgeon: Tobi Bastos, MD;  Location: [facility];  Service: Orthopedics;  Laterality: Right;  WITH MEDIAL MENISECTOMY  . Cataract extraction Bilateral   . Tonsillectomy      as child  . Ovarian cyst surgery      removed from left jaw area- benign    Family History  Problem Relation Age of Onset  . Colon cancer    . Heart attack    . Cancer    . Hypertension    . Hyperlipidemia    . Congestive Heart Failure Mother   . Cancer Sister   . Cancer Maternal  Grandfather   . Colon cancer Father   . Heart attack Brother    Social History:  reports that he quit smoking about 33 years ago. His smoking use included Cigarettes. He has a 40 pack-year smoking history. He has never used smokeless tobacco. He reports that he drinks about 3.5 oz of alcohol per week. He reports that he does not use illicit drugs.  Allergies:  Allergies  Allergen Reactions  . Augmentin [Amoxicillin-Pot Clavulanate] Diarrhea  . Carboplatin     Rash and itching after carbo test dose  . Levaquin [Levofloxacin In D5w]     Knee pain  . Percocet [Oxycodone-Acetaminophen] Nausea And Vomiting  . Zolpidem Tartrate Diarrhea    Current outpatient prescriptions:  .  amLODipine (NORVASC) 2.5 MG tablet, Take 2.5 mg by mouth every morning. , Disp: , Rfl:  .  Ascorbic Acid (VITAMIN C) 1000 MG tablet, Take 1,000 mg by mouth daily., Disp: , Rfl:  .  atenolol (TENORMIN) 100 MG tablet, Take 100 mg by mouth every morning. , Disp: , Rfl:  .  B Complex-Biotin-FA (B-COMPLEX PO), Take 1 tablet by mouth daily. 1500mg  daily, Disp: , Rfl:  .  clotrimazole-betamethasone (LOTRISONE) cream, Apply 1 application topically 2 (two) times  daily. , Disp: , Rfl: 0 .  FeFum-FePoly-FA-B Cmp-C-Biot (FOLIVANE-PLUS) CAPS, Take 1 capsule by mouth daily., Disp: 90 capsule, Rfl: 3 .  Multiple Vitamin (MULTIVITAMIN) tablet, Take 1 tablet by mouth daily. , Disp: , Rfl:  .  MYRBETRIQ 50 MG TB24 tablet, Take 50 mg by mouth daily., Disp: , Rfl:  .  omeprazole (PRILOSEC) 40 MG capsule, Take 1 capsule (40 mg total) by mouth every evening., Disp: 90 capsule, Rfl: 3 .  Sennosides-Docusate Sodium (SENOKOT S PO), Take 1 tablet by mouth daily. 2 tabs daily, Disp: , Rfl:    Results for orders placed or performed during the hospital encounter of 06/03/14 (from the past 48 hour(s))  APTT     Status: None   Collection Time: 06/03/14  1:35 PM  Result Value Ref Range   aPTT 37 24 - 37 seconds    Comment:        IF BASELINE  aPTT IS ELEVATED, SUGGEST PATIENT RISK ASSESSMENT BE USED TO DETERMINE APPROPRIATE ANTICOAGULANT THERAPY.   Protime-INR     Status: None   Collection Time: 06/03/14  1:35 PM  Result Value Ref Range   Prothrombin Time 15.0 11.6 - 15.2 seconds   INR 1.17 0.00 - 1.49  Urinalysis, Routine w reflex microscopic     Status: None   Collection Time: 06/03/14  2:06 PM  Result Value Ref Range   Color, Urine YELLOW YELLOW   APPearance CLEAR CLEAR   Specific Gravity, Urine 1.012 1.005 - 1.030   pH 5.5 5.0 - 8.0   Glucose, UA NEGATIVE NEGATIVE mg/dL   Hgb urine dipstick NEGATIVE NEGATIVE   Bilirubin Urine NEGATIVE NEGATIVE   Ketones, ur NEGATIVE NEGATIVE mg/dL   Protein, ur NEGATIVE NEGATIVE mg/dL   Urobilinogen, UA 0.2 0.0 - 1.0 mg/dL   Nitrite NEGATIVE NEGATIVE   Leukocytes, UA NEGATIVE NEGATIVE    Comment: MICROSCOPIC NOT DONE ON URINES WITH NEGATIVE PROTEIN, BLOOD, LEUKOCYTES, NITRITE, OR GLUCOSE <1000 mg/dL.    Review of Systems  Constitutional: Negative.   HENT: Positive for hearing loss. Negative for congestion, ear discharge, ear pain, nosebleeds, sore throat and tinnitus.   Eyes: Positive for photophobia. Negative for blurred vision, double vision, pain, discharge and redness.  Respiratory: Positive for shortness of breath. Negative for cough, hemoptysis, sputum production, wheezing and stridor.        SOB with exertion  Cardiovascular: Negative.   Gastrointestinal: Positive for heartburn.  Genitourinary: Negative.   Musculoskeletal: Positive for joint pain. Negative for myalgias, back pain, falls and neck pain.       Right shoulder pain  Skin: Negative.   Neurological: Negative.  Negative for headaches.  Endo/Heme/Allergies: Negative for environmental allergies and polydipsia. Bruises/bleeds easily.  Psychiatric/Behavioral: Negative.     Physical Exam  Constitutional: He is oriented to person, place, and time. He appears well-developed and well-nourished. No distress.   HENT:  Head: Normocephalic and atraumatic.  Right Ear: External ear normal.  Left Ear: External ear normal.  Nose: Nose normal.  Mouth/Throat: Oropharynx is clear and moist.  Eyes: Conjunctivae and EOM are normal.  Neck: Normal range of motion. Neck supple.  Cardiovascular: Normal rate, regular rhythm, normal heart sounds and intact distal pulses.   No murmur heard. Respiratory: Effort normal and breath sounds normal. No respiratory distress. He has no wheezes.  GI: Soft. Bowel sounds are normal. He exhibits no distension. There is no tenderness.  Musculoskeletal:       Right shoulder: He exhibits decreased range of  motion, tenderness, crepitus, pain and decreased strength.       Left shoulder: Normal.       Right elbow: Normal.      Left elbow: Normal.       Right wrist: Normal.       Left wrist: Normal.  Neurological: He is alert and oriented to person, place, and time. He has normal reflexes. No sensory deficit.  Skin: No rash noted. He is not diaphoretic. No erythema.  Psychiatric: He has a normal mood and affect. His behavior is normal.     Assessment/Plan Right shoulder, rotator cuff tear. He needs an open rotator cuff repair with graft and anchors as well as acrominonectomy. We may or may not need to use a graft material that is made from calf skin. This is approved by the FDA and I have not had a rejection of that graft yet. Also, we may need to use anchors. These are polyethylene anchors that stay in the bone and we use those anchors to suture the tendon down in certain cases where the tendon is completely pulled off the bone. There is always a chance of a secondary infection obviously with any surgery but we do use antibiotics preop.   H&P performed by Dr. Latanya Maudlin Documented by Ardeen Jourdain, PA-C   [place], Fortune Brannigan Ander Purpura 06/04/2014, 9:56 AM

## 2014-06-05 ENCOUNTER — Observation Stay (HOSPITAL_COMMUNITY)
Admission: RE | Admit: 2014-06-05 | Discharge: 2014-06-06 | Disposition: A | Discharge status: Home / Self Care | Payer: Medicare Other | Source: Ambulatory Visit | Attending: Orthopedic Surgery | Admitting: Orthopedic Surgery

## 2014-06-05 ENCOUNTER — Ambulatory Visit (HOSPITAL_COMMUNITY): Payer: Medicare Other | Admitting: Anesthesiology

## 2014-06-05 ENCOUNTER — Encounter (HOSPITAL_COMMUNITY)
Admission: RE | Disposition: A | Discharge status: Home / Self Care | Payer: Self-pay | Source: Ambulatory Visit | Attending: Orthopedic Surgery

## 2014-06-05 ENCOUNTER — Encounter (HOSPITAL_COMMUNITY): Payer: Self-pay | Admitting: *Deleted

## 2014-06-05 DIAGNOSIS — Z881 Allergy status to other antibiotic agents status: Secondary | ICD-10-CM | POA: Insufficient documentation

## 2014-06-05 DIAGNOSIS — M7541 Impingement syndrome of right shoulder: Secondary | ICD-10-CM | POA: Insufficient documentation

## 2014-06-05 DIAGNOSIS — Z888 Allergy status to other drugs, medicaments and biological substances status: Secondary | ICD-10-CM | POA: Insufficient documentation

## 2014-06-05 DIAGNOSIS — R0602 Shortness of breath: Secondary | ICD-10-CM | POA: Insufficient documentation

## 2014-06-05 DIAGNOSIS — Z85118 Personal history of other malignant neoplasm of bronchus and lung: Secondary | ICD-10-CM | POA: Insufficient documentation

## 2014-06-05 DIAGNOSIS — M75101 Unspecified rotator cuff tear or rupture of right shoulder, not specified as traumatic: Secondary | ICD-10-CM | POA: Diagnosis not present

## 2014-06-05 DIAGNOSIS — Z886 Allergy status to analgesic agent status: Secondary | ICD-10-CM | POA: Insufficient documentation

## 2014-06-05 DIAGNOSIS — N400 Enlarged prostate without lower urinary tract symptoms: Secondary | ICD-10-CM | POA: Insufficient documentation

## 2014-06-05 DIAGNOSIS — K219 Gastro-esophageal reflux disease without esophagitis: Secondary | ICD-10-CM | POA: Insufficient documentation

## 2014-06-05 DIAGNOSIS — I10 Essential (primary) hypertension: Secondary | ICD-10-CM | POA: Diagnosis not present

## 2014-06-05 DIAGNOSIS — M75100 Unspecified rotator cuff tear or rupture of unspecified shoulder, not specified as traumatic: Secondary | ICD-10-CM | POA: Diagnosis present

## 2014-06-05 DIAGNOSIS — Z87891 Personal history of nicotine dependence: Secondary | ICD-10-CM | POA: Insufficient documentation

## 2014-06-05 HISTORY — PX: SHOULDER OPEN ROTATOR CUFF REPAIR: SHX2407

## 2014-06-05 SURGERY — REPAIR, ROTATOR CUFF, OPEN
Anesthesia: General | Site: Shoulder | Laterality: Right

## 2014-06-05 MED ORDER — ATENOLOL 100 MG PO TABS
100.0000 mg | ORAL_TABLET | Freq: Every morning | ORAL | Status: DC
  Administered 2014-06-06: 100 mg via ORAL
  Filled 2014-06-05: qty 1

## 2014-06-05 MED ORDER — DEXTROSE 5 % IV SOLN
500.0000 mg | Freq: Four times a day (QID) | INTRAVENOUS | Status: DC | PRN
  Administered 2014-06-05: 500 mg via INTRAVENOUS
  Filled 2014-06-05 (×2): qty 5

## 2014-06-05 MED ORDER — HYDROMORPHONE HCL 2 MG PO TABS
2.0000 mg | ORAL_TABLET | ORAL | Status: DC | PRN
  Administered 2014-06-05 – 2014-06-06 (×6): 2 mg via ORAL
  Filled 2014-06-05 (×6): qty 1

## 2014-06-05 MED ORDER — FENTANYL CITRATE 0.05 MG/ML IJ SOLN
INTRAMUSCULAR | Status: DC | PRN
  Administered 2014-06-05 (×2): 50 ug via INTRAVENOUS

## 2014-06-05 MED ORDER — LIDOCAINE HCL (CARDIAC) 20 MG/ML IV SOLN
INTRAVENOUS | Status: AC
  Filled 2014-06-05: qty 5

## 2014-06-05 MED ORDER — HYDROMORPHONE HCL 1 MG/ML IJ SOLN: 1.0000 mg | INTRAMUSCULAR | Status: DC | PRN

## 2014-06-05 MED ORDER — MIDAZOLAM HCL 2 MG/2ML IJ SOLN
INTRAMUSCULAR | Status: AC
  Filled 2014-06-05: qty 2

## 2014-06-05 MED ORDER — ACETAMINOPHEN 650 MG RE SUPP: 650.0000 mg | Freq: Four times a day (QID) | RECTAL | Status: DC | PRN

## 2014-06-05 MED ORDER — AMLODIPINE BESYLATE 2.5 MG PO TABS
2.5000 mg | ORAL_TABLET | Freq: Every morning | ORAL | Status: DC
  Administered 2014-06-06: 2.5 mg via ORAL
  Filled 2014-06-05: qty 1

## 2014-06-05 MED ORDER — ASPIRIN EC 325 MG PO TBEC
325.0000 mg | DELAYED_RELEASE_TABLET | Freq: Every day | ORAL | Status: DC
  Administered 2014-06-06: 325 mg via ORAL
  Filled 2014-06-05: qty 1

## 2014-06-05 MED ORDER — PHENYLEPHRINE HCL 10 MG/ML IJ SOLN
INTRAMUSCULAR | Status: DC | PRN
  Administered 2014-06-05 (×2): 80 ug via INTRAVENOUS

## 2014-06-05 MED ORDER — PROPOFOL 10 MG/ML IV BOLUS
INTRAVENOUS | Status: AC
  Filled 2014-06-05: qty 20

## 2014-06-05 MED ORDER — ONDANSETRON HCL 4 MG PO TABS: 4.0000 mg | ORAL_TABLET | Freq: Four times a day (QID) | ORAL | Status: DC | PRN

## 2014-06-05 MED ORDER — HYDROMORPHONE HCL 1 MG/ML IJ SOLN
0.2500 mg | INTRAMUSCULAR | Status: DC | PRN
  Administered 2014-06-05 (×2): 0.5 mg via INTRAVENOUS

## 2014-06-05 MED ORDER — METHOCARBAMOL 500 MG PO TABS
500.0000 mg | ORAL_TABLET | Freq: Four times a day (QID) | ORAL | Status: DC | PRN
  Administered 2014-06-05 – 2014-06-06 (×2): 500 mg via ORAL
  Filled 2014-06-05 (×2): qty 1

## 2014-06-05 MED ORDER — ONDANSETRON HCL 4 MG/2ML IJ SOLN: 4.0000 mg | Freq: Four times a day (QID) | INTRAMUSCULAR | Status: DC | PRN

## 2014-06-05 MED ORDER — ONDANSETRON HCL 4 MG/2ML IJ SOLN
INTRAMUSCULAR | Status: DC | PRN
  Administered 2014-06-05: 4 mg via INTRAVENOUS

## 2014-06-05 MED ORDER — BISACODYL 5 MG PO TBEC: 5.0000 mg | DELAYED_RELEASE_TABLET | Freq: Every day | ORAL | Status: DC | PRN

## 2014-06-05 MED ORDER — LIDOCAINE HCL (CARDIAC) 20 MG/ML IV SOLN
INTRAVENOUS | Status: DC | PRN
  Administered 2014-06-05: 10 mg via INTRAVENOUS

## 2014-06-05 MED ORDER — POLYETHYLENE GLYCOL 3350 17 G PO PACK: 17.0000 g | PACK | Freq: Every day | ORAL | Status: DC | PRN

## 2014-06-05 MED ORDER — MIDAZOLAM HCL 5 MG/5ML IJ SOLN
INTRAMUSCULAR | Status: DC | PRN
  Administered 2014-06-05: 2 mg via INTRAVENOUS

## 2014-06-05 MED ORDER — ACETAMINOPHEN 325 MG PO TABS: 650.0000 mg | ORAL_TABLET | Freq: Four times a day (QID) | ORAL | Status: DC | PRN

## 2014-06-05 MED ORDER — DEXAMETHASONE SODIUM PHOSPHATE 10 MG/ML IJ SOLN
INTRAMUSCULAR | Status: DC | PRN
  Administered 2014-06-05: 10 mg via INTRAVENOUS

## 2014-06-05 MED ORDER — LACTATED RINGERS IV SOLN: INTRAVENOUS | Status: DC

## 2014-06-05 MED ORDER — MENTHOL 3 MG MT LOZG: 1.0000 | LOZENGE | OROMUCOSAL | Status: DC | PRN

## 2014-06-05 MED ORDER — PANTOPRAZOLE SODIUM 40 MG PO TBEC
40.0000 mg | DELAYED_RELEASE_TABLET | Freq: Every day | ORAL | Status: DC
  Administered 2014-06-05: 40 mg via ORAL
  Filled 2014-06-05 (×2): qty 1

## 2014-06-05 MED ORDER — PROPOFOL 10 MG/ML IV BOLUS
INTRAVENOUS | Status: DC | PRN
  Administered 2014-06-05: 160 mg via INTRAVENOUS

## 2014-06-05 MED ORDER — FOLIVANE-PLUS PO CAPS: 1.0000 | ORAL_CAPSULE | Freq: Every day | ORAL | Status: DC

## 2014-06-05 MED ORDER — LACTATED RINGERS IV SOLN
INTRAVENOUS | Status: DC
  Administered 2014-06-05: 15:00:00 via INTRAVENOUS
  Administered 2014-06-05: 1000 mL via INTRAVENOUS

## 2014-06-05 MED ORDER — THROMBIN 5000 UNITS EX SOLR
CUTANEOUS | Status: AC
  Filled 2014-06-05: qty 5000

## 2014-06-05 MED ORDER — PHENOL 1.4 % MT LIQD
1.0000 | OROMUCOSAL | Status: DC | PRN
  Filled 2014-06-05: qty 177

## 2014-06-05 MED ORDER — THROMBIN 5000 UNITS EX SOLR
OROMUCOSAL | Status: DC | PRN
  Administered 2014-06-05: 15:00:00 via TOPICAL

## 2014-06-05 MED ORDER — SODIUM CHLORIDE 0.9 % IR SOLN
Status: DC | PRN
  Administered 2014-06-05: 500 mL

## 2014-06-05 MED ORDER — FENTANYL CITRATE 0.05 MG/ML IJ SOLN
INTRAMUSCULAR | Status: AC
  Filled 2014-06-05: qty 2

## 2014-06-05 MED ORDER — HYDROMORPHONE HCL 1 MG/ML IJ SOLN
INTRAMUSCULAR | Status: AC
  Filled 2014-06-05: qty 1

## 2014-06-05 MED ORDER — FLEET ENEMA 7-19 GM/118ML RE ENEM: 1.0000 | ENEMA | Freq: Once | RECTAL | Status: AC | PRN

## 2014-06-05 MED ORDER — SODIUM CHLORIDE 0.9 % IR SOLN
Status: AC
  Filled 2014-06-05: qty 1

## 2014-06-05 MED ORDER — SUCCINYLCHOLINE CHLORIDE 20 MG/ML IJ SOLN
INTRAMUSCULAR | Status: DC | PRN
  Administered 2014-06-05: 100 mg via INTRAVENOUS

## 2014-06-05 MED ORDER — EPHEDRINE SULFATE 50 MG/ML IJ SOLN
INTRAMUSCULAR | Status: DC | PRN
  Administered 2014-06-05: 10 mg via INTRAVENOUS

## 2014-06-05 MED ORDER — MIRABEGRON ER 50 MG PO TB24
50.0000 mg | ORAL_TABLET | Freq: Every day | ORAL | Status: DC
  Administered 2014-06-05: 50 mg via ORAL
  Filled 2014-06-05 (×2): qty 1

## 2014-06-05 MED ORDER — BUPIVACAINE LIPOSOME 1.3 % IJ SUSP
20.0000 mL | Freq: Once | INTRAMUSCULAR | Status: AC
  Administered 2014-06-05: 20 mL
  Filled 2014-06-05: qty 20

## 2014-06-05 MED ORDER — CEFAZOLIN SODIUM-DEXTROSE 2-3 GM-% IV SOLR
2.0000 g | INTRAVENOUS | Status: AC
  Administered 2014-06-05: 2 g via INTRAVENOUS

## 2014-06-05 MED ORDER — ADULT MULTIVITAMIN W/MINERALS CH
1.0000 | ORAL_TABLET | Freq: Every day | ORAL | Status: DC
  Filled 2014-06-05: qty 1

## 2014-06-05 MED ORDER — CEFAZOLIN SODIUM-DEXTROSE 2-3 GM-% IV SOLR
INTRAVENOUS | Status: AC
  Filled 2014-06-05: qty 50

## 2014-06-05 MED ORDER — CEFAZOLIN SODIUM 1-5 GM-% IV SOLN
1.0000 g | Freq: Four times a day (QID) | INTRAVENOUS | Status: AC
  Administered 2014-06-05 – 2014-06-06 (×3): 1 g via INTRAVENOUS
  Filled 2014-06-05 (×3): qty 50

## 2014-06-05 MED ORDER — HYDROCODONE-HOMATROPINE 5-1.5 MG/5ML PO SYRP: 5.0000 mL | ORAL_SOLUTION | Freq: Four times a day (QID) | ORAL | Status: DC | PRN

## 2014-06-05 SURGICAL SUPPLY — 45 items
ATTRACTOMAT 16X20 MAGNETIC DRP (DRAPES) ×3 IMPLANT
BAG SPEC THK2 15X12 ZIP CLS (MISCELLANEOUS)
BAG ZIPLOCK 12X15 (MISCELLANEOUS) IMPLANT
BLADE OSCILLATING/SAGITTAL (BLADE) ×3
BLADE SW THK.38XMED LNG THN (BLADE) ×1 IMPLANT
BNDG COHESIVE 6X5 TAN STRL LF (GAUZE/BANDAGES/DRESSINGS) ×3 IMPLANT
BUR OVAL CARBIDE 4.0 (BURR) ×3 IMPLANT
DRAPE POUCH INSTRU U-SHP 10X18 (DRAPES) ×3 IMPLANT
DRSG AQUACEL AG ADV 3.5X 4 (GAUZE/BANDAGES/DRESSINGS) ×2 IMPLANT
DRSG AQUACEL AG ADV 3.5X 6 (GAUZE/BANDAGES/DRESSINGS) ×3 IMPLANT
DURAPREP 26ML APPLICATOR (WOUND CARE) ×3 IMPLANT
ELECT BLADE TIP CTD 4 INCH (ELECTRODE) IMPLANT
ELECT REM PT RETURN 9FT ADLT (ELECTROSURGICAL) ×3
ELECTRODE REM PT RTRN 9FT ADLT (ELECTROSURGICAL) ×1 IMPLANT
GLOVE BIOGEL PI IND STRL 6.5 (GLOVE) ×1 IMPLANT
GLOVE BIOGEL PI IND STRL 8 (GLOVE) ×1 IMPLANT
GLOVE BIOGEL PI INDICATOR 6.5 (GLOVE) ×2
GLOVE BIOGEL PI INDICATOR 8 (GLOVE) ×2
GLOVE ECLIPSE 8.0 STRL XLNG CF (GLOVE) ×3 IMPLANT
GLOVE SURG SS PI 6.5 STRL IVOR (GLOVE) ×3 IMPLANT
GOWN STRL REUS W/TWL LRG LVL3 (GOWN DISPOSABLE) ×3 IMPLANT
GOWN STRL REUS W/TWL XL LVL3 (GOWN DISPOSABLE) ×3 IMPLANT
KIT BASIN OR (CUSTOM PROCEDURE TRAY) ×3 IMPLANT
KIT POSITION SHOULDER SCHLEI (MISCELLANEOUS) ×3 IMPLANT
LIQUID BAND (GAUZE/BANDAGES/DRESSINGS) ×3 IMPLANT
MANIFOLD NEPTUNE II (INSTRUMENTS) ×3 IMPLANT
NEEDLE MA TROC 1/2 (NEEDLE) IMPLANT
NS IRRIG 1000ML POUR BTL (IV SOLUTION) IMPLANT
PACK SHOULDER (CUSTOM PROCEDURE TRAY) ×3 IMPLANT
POSITIONER SURGICAL ARM (MISCELLANEOUS) ×3 IMPLANT
SLING ARM IMMOBILIZER LRG (SOFTGOODS) ×3 IMPLANT
SPONGE LAP 4X18 X RAY DECT (DISPOSABLE) IMPLANT
SPONGE SURGIFOAM ABS GEL 100 (HEMOSTASIS) ×3 IMPLANT
STAPLER VISISTAT 35W (STAPLE) IMPLANT
SUCTION FRAZIER 12FR DISP (SUCTIONS) ×3 IMPLANT
SUT BONE WAX W31G (SUTURE) ×3 IMPLANT
SUT ETHIBOND NAB CT1 #1 30IN (SUTURE) ×3 IMPLANT
SUT MNCRL AB 4-0 PS2 18 (SUTURE) ×3 IMPLANT
SUT VIC AB 0 CT1 27 (SUTURE)
SUT VIC AB 0 CT1 27XBRD ANTBC (SUTURE) IMPLANT
SUT VIC AB 1 CT1 27 (SUTURE) ×6
SUT VIC AB 1 CT1 27XBRD ANTBC (SUTURE) ×2 IMPLANT
SUT VIC AB 2-0 CT1 27 (SUTURE) ×6
SUT VIC AB 2-0 CT1 TAPERPNT 27 (SUTURE) ×2 IMPLANT
TOWEL OR 17X26 10 PK STRL BLUE (TOWEL DISPOSABLE) ×3 IMPLANT

## 2014-06-05 NOTE — H&P (View-Only) (Signed)
Pleas put order in [facility] 06-05-14 pre op 06-03-14 Thanks

## 2014-06-05 NOTE — Plan of Care (Signed)
Problem: Consults Goal: Diagnosis - Shoulder Surgery Rotator Cuff

## 2014-06-05 NOTE — Transfer of Care (Signed)
Immediate Anesthesia Transfer of Care Note  Patient: [name]  Procedure(s) Performed: Procedure(s): RIGHT ROTATOR CUFF REPAIR SHOULDER OPEN (Right)  Patient Location: PACU  Anesthesia Type:General  Level of Consciousness: sedated  Airway & Oxygen Therapy: Patient Spontanous Breathing and Patient connected to face mask oxygen  Post-op Assessment: Report given to RN and Post -op Vital signs reviewed and stable  Post vital signs: Reviewed and stable  Last Vitals:  Filed Vitals:   06/05/14 1131  BP: 165/83  Pulse: 57  Temp: 36.2 C  Resp: 18    Complications: No apparent anesthesia complications

## 2014-06-05 NOTE — Anesthesia Preprocedure Evaluation (Addendum)
Anesthesia Evaluation  Patient identified by MRN, date of birth, ID band Patient awake    Reviewed: Allergy & Precautions, H&P , NPO status , Patient's Chart, lab work & pertinent test results  Airway Mallampati: II  TM Distance: >3 FB Neck ROM: full    Dental  (+) Missing, Dental Advisory Given All front upper teeth are missing:   Pulmonary shortness of breath and with exertion, former smoker,  Lung cancer with mets left lung.  On chemo breath sounds clear to auscultation  Pulmonary exam normal       Cardiovascular Exercise Tolerance: Good hypertension, Pt. on medications and Pt. on home beta blockers Rhythm:regular Rate:Normal     Neuro/Psych tremor negative neurological ROS  negative psych ROS   GI/Hepatic negative GI ROS, Neg liver ROS, GERD-  Medicated and Controlled,  Endo/Other  negative endocrine ROS  Renal/GU negative Renal ROS  negative genitourinary   Musculoskeletal   Abdominal   Peds  Hematology negative hematology ROS (+) pancytopenia   Anesthesia Other Findings   Reproductive/Obstetrics negative OB ROS                            Anesthesia Physical Anesthesia Plan  ASA: III  Anesthesia Plan: General   Post-op Pain Management:    Induction: Intravenous  Airway Management Planned: Oral ETT  Additional Equipment:   Intra-op Plan:   Post-operative Plan: Extubation in OR  Informed Consent: I have reviewed the patients History and Physical, chart, labs and discussed the procedure including the risks, benefits and alternatives for the proposed anesthesia with the patient or authorized representative who has indicated his/her understanding and acceptance.   Dental Advisory Given  Plan Discussed with: CRNA and Surgeon  Anesthesia Plan Comments:         Anesthesia Quick Evaluation

## 2014-06-05 NOTE — Interval H&P Note (Signed)
History and Physical Interval Note:  06/05/2014 1:54 PM  [name]  has presented today for surgery, with the diagnosis of RIGHT SHOULDER ROTATOR CUFF TEAR  The various methods of treatment have been discussed with the patient and family. After consideration of risks, benefits and other options for treatment, the patient has consented to  Procedure(s): [place] (Right) as a surgical intervention .  The patient's history has been reviewed, patient examined, no change in status, stable for surgery.  I have reviewed the patient's chart and labs.  Questions were answered to the patient's satisfaction.     [name] A

## 2014-06-05 NOTE — Anesthesia Postprocedure Evaluation (Signed)
  Anesthesia Post-op Note  Patient: [name]  Procedure(s) Performed: Procedure(s) (LRB): RIGHT ROTATOR CUFF REPAIR SHOULDER OPEN (Right)  Patient Location: PACU  Anesthesia Type: General  Level of Consciousness: awake and alert   Airway and Oxygen Therapy: Patient Spontanous Breathing  Post-op Pain: mild  Post-op Assessment: Post-op Vital signs reviewed, Patient's Cardiovascular Status Stable, Respiratory Function Stable, Patent Airway and No signs of Nausea or vomiting  Last Vitals:  Filed Vitals:   06/05/14 1131  BP: 165/83  Pulse: 57  Temp: 36.2 C  Resp: 18    Post-op Vital Signs: stable   Complications: No apparent anesthesia complications

## 2014-06-05 NOTE — Brief Op Note (Signed)
06/05/2014  3:19 PM  PATIENT:  [name]  69 y.o. male  PRE-OPERATIVE DIAGNOSIS:  RIGHT SHOULDER ROTATOR CUFF TEAR and Severe Impingement Syndrome.  POST-OPERATIVE DIAGNOSIS:  RIGHT SHOULDER ROTATOR CUFF TEAR and Severe Impingement Syndrome  PROCEDURE:  Procedure(s): RIGHT ROTATOR CUFF REPAIR SHOULDER OPEN (Right) and Open Acromionectomy  SURGEON:  Surgeon(s) and Role:    * Latanya Maudlin, MD - Primary  PHYSICIAN ASSISTANT: Ardeen Jourdain PA  ASSISTANTS:Amber Stanton PA   ANESTHESIA:   general  EBL:  Total I/O In: 1000 [I.V.:1000] Out: -   BLOOD ADMINISTERED:none  DRAINS: none   LOCAL MEDICATIONS USED:  BUPIVICAINE   SPECIMEN:  No Specimen  DISPOSITION OF SPECIMEN:  N/A  COUNTS:  YES  TOURNIQUET:  * No tourniquets in log *  DICTATION: .Other Dictation: Dictation Number [id number]  PLAN OF CARE: Admit for overnight observation  PATIENT DISPOSITION:  Stable in OR   Delay start of Pharmacological VTE agent (>24hrs) due to surgical blood loss or risk of bleeding: yes

## 2014-06-06 ENCOUNTER — Encounter (HOSPITAL_COMMUNITY): Payer: Self-pay | Admitting: Orthopedic Surgery

## 2014-06-06 DIAGNOSIS — M75101 Unspecified rotator cuff tear or rupture of right shoulder, not specified as traumatic: Secondary | ICD-10-CM | POA: Diagnosis not present

## 2014-06-06 MED ORDER — HYDROMORPHONE HCL 2 MG PO TABS: 2.0000 mg | ORAL_TABLET | ORAL | Status: DC | PRN

## 2014-06-06 MED ORDER — METHOCARBAMOL 500 MG PO TABS: 500.0000 mg | ORAL_TABLET | Freq: Four times a day (QID) | ORAL | Status: DC | PRN

## 2014-06-06 NOTE — Op Note (Signed)
[name], [name]             ACCOUNT NO.:  [id number]  MEDICAL RECORD NO.:  [id number]  LOCATION:  9622                         FACILITY:  [facility]  PHYSICIAN:  Kipp Brood. Sharda Keddy, M.D.DATE OF BIRTH:  May 08, 1945  DATE OF PROCEDURE:  06/05/2014 DATE OF DISCHARGE:                              OPERATIVE REPORT   SURGEON:  Kipp Brood. Gladstone Lighter, M.D.  ASSISTANT:  Ardeen Jourdain, [place].  PREOPERATIVE DIAGNOSIS: 1. Severe impingement syndrome, right shoulder. 2. Tear of the rotator cuff tendon, right shoulder.  POSTOPERATIVE DIAGNOSIS: 1. Severe impingement syndrome, right shoulder. 2. Tear of the rotator cuff tendon, right shoulder.  OPERATION: 1. Open acromionectomy and acromioplasty of right shoulder. 2. Repair of rotator cuff tendon, right shoulder by primary suture.  DESCRIPTION OF PROCEDURE:  Under general anesthesia, the patient in a beach chair position, a routine orthopedic prep and draping of the right shoulder was carried out.  He had 2 g of IV Ancef.  The appropriate time- out was first carried out.  I also marked the appropriate right shoulder in the holding area.  At this time, an incision was made over the anterior aspect of the right shoulder.  Bleeders were identified and cauterized.  At this particular time, I partially detached the deltoid tendon and split a small proximal part of the deltoid muscle in the usual fashion.  I then identified a large overhanging acromion.  I protected the underlying cuff with a Bennett retractor.  Utilized an oscillating saw, I did an open acromionectomy followed by acromioplasty utilizing a bur.  Thoroughly irrigated out the area and then bone waxed the undersurface of the acromion.  At this particular time, we went back and identified a large tear in the cuff just at the musculotendinous junction region and that was sutured in place.  No graft or anchor was necessary.  Thoroughly irrigated out the area, reinspected the shoulder by  internally and externally rotating the shoulder.  At that time, I then inserted some thrombin-soaked Gelfoam and reapproximated deltoid tendon and muscle in usual fashion.  Subcutaneous suture was utilized to close the wound with a running Monocryl suture.  He will be placed in a shoulder immobilizer.         ______________________________ Kipp Brood Gladstone Lighter, M.D.    RAG/MEDQ  D:  06/05/2014  T:  06/06/2014  Job:  [id number]

## 2014-06-06 NOTE — Progress Notes (Signed)
UR completed 

## 2014-06-06 NOTE — Progress Notes (Signed)
Subjective: 1 Day Post-Op Procedure(s) (LRB): RIGHT ROTATOR CUFF REPAIR SHOULDER OPEN (Right) Patient reports pain as 1 on 0-10 scale.Doing very well. Will DC    Objective: Vital signs in last 24 hours: Temp:  [97.2 F (36.2 C)-98.2 F (36.8 C)] 97.9 F (36.6 C) (03/10 0506) Pulse Rate:  [57-78] 59 (03/10 0506) Resp:  [13-18] 16 (03/10 0506) BP: (119-171)/(63-129) 125/77 mmHg (03/10 0506) SpO2:  [93 %-100 %] 98 % (03/10 0506) Weight:  [63.504 kg (140 lb)] 63.504 kg (140 lb) (03/09 1139)  Intake/Output from previous day: 03/09 0701 - 03/10 0700 In: 2180 [P.O.:240; I.V.:1890; IV Piggyback:50] Out: 2375 [Urine:2375] Intake/Output this shift:    No results for input(s): HGB in the last 72 hours. No results for input(s): WBC, RBC, HCT, PLT in the last 72 hours. No results for input(s): NA, K, CL, CO2, BUN, CREATININE, GLUCOSE, CALCIUM in the last 72 hours.  Recent Labs  06/03/14 1335  INR 1.17    Dorsiflexion/Plantar flexion intact  Assessment/Plan: 1 Day Post-Op Procedure(s) (LRB): RIGHT ROTATOR CUFF REPAIR SHOULDER OPEN (Right) Up with therapy discontinued tomorrow.  Adalaya Irion A 06/06/2014, 7:12 AM

## 2014-06-06 NOTE — Discharge Instructions (Signed)
Keep your sling on at all times, including sleeping in your sling. The only time you should remove your sling is to shower only but you need to keep your hand against your chest while you shower.  You may shower with the dressing on. Do not remove the dressing unless it appears compromised. Call Dr. Gladstone Lighter if any wound complications or temperature of 101 degrees F or over.  Call the office for an appointment to see Dr. Gladstone Lighter in two weeks: [phone] and ask for Dr. Charlestine Night nurse, Brunilda Payor.

## 2014-06-10 NOTE — Discharge Summary (Signed)
Physician Discharge Summary   Patient ID: [name] MRN: [id number] DOB/AGE: 1945/12/03 69 y.o.  Admit date: 06/05/2014 Discharge date: 06/06/2014  Primary Diagnosis: Right shoulder, impingement and partial rotator cuff tear   Admission Diagnoses:  Past Medical History  Diagnosis Date  . Hypertension   . Arthritis RIGHT SHOULDER  . Immature cataract BILATERAL  . Non-small cell lung cancer DX SEPT 2010  W/ CHEMORADIATION AT THAT TIME -- NOW  W/ METS--  CURRENTLY ON MAINTENANCE CHEMO TX  EVERY 30 DAYS    ONCOLOGIST- DR [facility]  . Acute meniscal tear of knee RIGHT KNEE  . BPH (benign prostatic hypertrophy)   . Dizziness and giddiness 04/23/2014  . Tremor 04/23/2014    Right hand  . HOH (hard of hearing)     bilateral hearing aids   Discharge Diagnoses:   Active Problems:   Rotator cuff tear, non-traumatic  Estimated body mass index is 21.29 kg/(m^2) as calculated from the following:   Height as of this encounter: 5' 8" (1.727 m).   Weight as of this encounter: 63.504 kg (140 lb).  Procedure:  Procedure(s) (LRB): RIGHT ROTATOR CUFF REPAIR SHOULDER OPEN (Right)   Consults: None  HPI: The patient presented with the chief complaint of right shoulder pain. He reports that his right shoulder has given him trouble over the past five years. No specific injury. He reports that in the past few months the pain has worsened. No change in activity. He reports pain and stiffness in the right shoulder. No numbness or tingling in the arm. MRI of the right shoulder showed a tear of the right rotator cuff. He has a history of lung cancer.  Laboratory Data: Hospital Outpatient Visit on 06/03/2014  Component Date Value Ref Range Status  . aPTT 06/03/2014 37  24 - 37 seconds Final   Comment:        IF BASELINE aPTT IS ELEVATED, SUGGEST PATIENT RISK ASSESSMENT BE USED TO DETERMINE APPROPRIATE ANTICOAGULANT THERAPY.   . Prothrombin Time 06/03/2014 15.0  11.6 - 15.2 seconds Final  . INR  06/03/2014 1.17  0.00 - 1.49 Final  . Color, Urine 06/03/2014 YELLOW  YELLOW Final  . APPearance 06/03/2014 CLEAR  CLEAR Final  . Specific Gravity, Urine 06/03/2014 1.012  1.005 - 1.030 Final  . pH 06/03/2014 5.5  5.0 - 8.0 Final  . Glucose, UA 06/03/2014 NEGATIVE  NEGATIVE mg/dL Final  . Hgb urine dipstick 06/03/2014 NEGATIVE  NEGATIVE Final  . Bilirubin Urine 06/03/2014 NEGATIVE  NEGATIVE Final  . Ketones, ur 06/03/2014 NEGATIVE  NEGATIVE mg/dL Final  . Protein, ur 06/03/2014 NEGATIVE  NEGATIVE mg/dL Final  . Urobilinogen, UA 06/03/2014 0.2  0.0 - 1.0 mg/dL Final  . Nitrite 06/03/2014 NEGATIVE  NEGATIVE Final  . Leukocytes, UA 06/03/2014 NEGATIVE  NEGATIVE Final   MICROSCOPIC NOT DONE ON URINES WITH NEGATIVE PROTEIN, BLOOD, LEUKOCYTES, NITRITE, OR GLUCOSE <1000 mg/dL.  Appointment on 05/14/2014  Component Date Value Ref Range Status  . Sodium 05/14/2014 141  136 - 145 mEq/L Final  . Potassium 05/14/2014 4.2  3.5 - 5.1 mEq/L Final  . Chloride 05/14/2014 107  98 - 109 mEq/L Final  . CO2 05/14/2014 24  22 - 29 mEq/L Final  . Glucose 05/14/2014 104  70 - 140 mg/dl Final  . BUN 05/14/2014 22.5  7.0 - 26.0 mg/dL Final  . Creatinine 05/14/2014 1.5* 0.7 - 1.3 mg/dL Final  . Total Bilirubin 05/14/2014 0.85  0.20 - 1.20 mg/dL Final  . Alkaline Phosphatase 05/14/2014  104  40 - 150 U/L Final  . AST 05/14/2014 20  5 - 34 U/L Final  . ALT 05/14/2014 22  0 - 55 U/L Final  . Total Protein 05/14/2014 7.1  6.4 - 8.3 g/dL Final  . Albumin 05/14/2014 3.7  3.5 - 5.0 g/dL Final  . Calcium 05/14/2014 9.7  8.4 - 10.4 mg/dL Final  . Anion Gap 05/14/2014 10  3 - 11 mEq/L Final  . EGFR 05/14/2014 48* >90 ml/min/1.73 m2 Final   eGFR is calculated using the CKD-EPI Creatinine Equation (2009)  . WBC 05/14/2014 6.9  4.0 - 10.3 10e3/uL Final  . NEUT# 05/14/2014 4.1  1.5 - 6.5 10e3/uL Final  . HGB 05/14/2014 11.7* 13.0 - 17.1 g/dL Final  . HCT 05/14/2014 34.9* 38.4 - 49.9 % Final  . Platelets 05/14/2014 159   140 - 400 10e3/uL Final  . MCV 05/14/2014 93.3  79.3 - 98.0 fL Final  . MCH 05/14/2014 31.3  27.2 - 33.4 pg Final  . MCHC 05/14/2014 33.5  32.0 - 36.0 g/dL Final  . RBC 05/14/2014 3.74* 4.20 - 5.82 10e6/uL Final  . RDW 05/14/2014 13.7  11.0 - 14.6 % Final  . lymph# 05/14/2014 1.6  0.9 - 3.3 10e3/uL Final  . MONO# 05/14/2014 0.8  0.1 - 0.9 10e3/uL Final  . Eosinophils Absolute 05/14/2014 0.4  0.0 - 0.5 10e3/uL Final  . Basophils Absolute 05/14/2014 0.0  0.0 - 0.1 10e3/uL Final  . NEUT% 05/14/2014 59.7  39.0 - 75.0 % Final  . LYMPH% 05/14/2014 23.4  14.0 - 49.0 % Final  . MONO% 05/14/2014 11.3  0.0 - 14.0 % Final  . EOS% 05/14/2014 5.2  0.0 - 7.0 % Final  . BASO% 05/14/2014 0.4  0.0 - 2.0 % Final  Office Visit on 04/23/2014  Component Date Value Ref Range Status  . Copper 04/23/2014 CANCELED   Final-Edited   Comment: [facility] was unable to obtain a suitable specimen for the following test(s), and is providing the patient with recollection instructions. received: royal blue requires: to be separated from cells                                 Detection Limit = 5  Result canceled by the ancillary   . Vitamin B-12 04/23/2014 494  211 - 946 pg/mL Final  . Angio Convert Enzyme 04/23/2014 55  14 - 82 U/L Final  . Anit Nuclear Antibody(ANA) 04/23/2014 Negative  Negative Final  . TSH 04/23/2014 2.910  0.450 - 4.500 uIU/mL Final  . specimen status report 04/23/2014 Comment   Preliminary   [facility]     X-Rays:Ct Abdomen Pelvis Wo Contrast  05/14/2014   CLINICAL DATA:  Followup non-small cell left lung carcinoma. Status post chemotherapy.  EXAM:  CT CHEST, ABDOMEN AND PELVIS WITHOUT CONTRAST  TECHNIQUE: Multidetector CT imaging of the chest, abdomen and pelvis was performed following the standard protocol without IV contrast.  COMPARISON:  02/05/2014  FINDINGS: CT CHEST FINDINGS  Mediastinum/Lymph Nodes: Mild lymphadenopathy in right paratracheal region shows no significant change with  largest lymph node measuring approximately 11 mm. No definite hilar lymphadenopathy seen on this noncontrast study. No other sites of thoracic lymphadenopathy identified.  Lungs/Pleura: Mild paramediastinal radiation changes are stable. No suspicious pulmonary nodules or masses are identified. No evidence of pulmonary infiltrate or pleural effusion.  Musculoskeletal/Soft Tissues: No suspicious bone lesions or other significant chest wall abnormality.  CT ABDOMEN AND PELVIS FINDINGS  Hepatobiliary: No masses or other significant abnormality identified without IV contrast.  Pancreas: No mass, inflammatory changes, or other significant abnormality identified without IV contrast.  Spleen:  Within normal limits in size.  Adrenals:  No masses identified.  Kidneys/Urinary Tract: No evidence of urolithiasis or hydronephrosis.  Stomach/Bowel/Peritoneum: No evidence of wall thickening, mass, or obstruction.  Vascular/Lymphatic: Mild retroperitoneal lymphadenopathy in the aorta caval and left paraaortic space is shows no significant change, with largest lymph nodes measuring 12 mm. No new sites of lymphadenopathy identified within the abdomen or pelvis.  Reproductive:  No mass or other significant abnormality identified.  Other:  None.  Musculoskeletal:  No suspicious bone lesions identified.  IMPRESSION: Stable mild mediastinal lymphadenopathy.  Stable mild abdominal retroperitoneal lymphadenopathy.  No new or progressive disease identified within the chest, abdomen or pelvis.   Electronically Signed   By: Earle Gell M.D.   On: 05/14/2014 13:22   Ct Chest Wo Contrast  05/14/2014   CLINICAL DATA:  Followup non-small cell left lung carcinoma. Status post chemotherapy.  EXAM:  CT CHEST, ABDOMEN AND PELVIS WITHOUT CONTRAST  TECHNIQUE: Multidetector CT imaging of the chest, abdomen and pelvis was performed following the standard protocol without IV contrast.  COMPARISON:  02/05/2014  FINDINGS: CT CHEST FINDINGS   Mediastinum/Lymph Nodes: Mild lymphadenopathy in right paratracheal region shows no significant change with largest lymph node measuring approximately 11 mm. No definite hilar lymphadenopathy seen on this noncontrast study. No other sites of thoracic lymphadenopathy identified.  Lungs/Pleura: Mild paramediastinal radiation changes are stable. No suspicious pulmonary nodules or masses are identified. No evidence of pulmonary infiltrate or pleural effusion.  Musculoskeletal/Soft Tissues: No suspicious bone lesions or other significant chest wall abnormality.  CT ABDOMEN AND PELVIS FINDINGS  Hepatobiliary: No masses or other significant abnormality identified without IV contrast.  Pancreas: No mass, inflammatory changes, or other significant abnormality identified without IV contrast.  Spleen:  Within normal limits in size.  Adrenals:  No masses identified.  Kidneys/Urinary Tract: No evidence of urolithiasis or hydronephrosis.  Stomach/Bowel/Peritoneum: No evidence of wall thickening, mass, or obstruction.  Vascular/Lymphatic: Mild retroperitoneal lymphadenopathy in the aorta caval and left paraaortic space is shows no significant change, with largest lymph nodes measuring 12 mm. No new sites of lymphadenopathy identified within the abdomen or pelvis.  Reproductive:  No mass or other significant abnormality identified.  Other:  None.  Musculoskeletal:  No suspicious bone lesions identified.  IMPRESSION: Stable mild mediastinal lymphadenopathy.  Stable mild abdominal retroperitoneal lymphadenopathy.  No new or progressive disease identified within the chest, abdomen or pelvis.   Electronically Signed   By: Earle Gell M.D.   On: 05/14/2014 13:22    EKG: Orders placed or performed in visit on 06/05/13  . EKG 12-Lead     Hospital Course: [name] is a 69 y.o. who was admitted to [facility]. They were brought to the operating room on 06/05/2014 and underwent Procedure(s): [place].  Patient tolerated the procedure well and was later transferred to the recovery room and then to the orthopaedic floor for postoperative care.  They were given PO and IV analgesics for pain control following their surgery.  They were given 24 hours of postoperative antibiotics of  Anti-infectives    Start     Dose/Rate Route Frequency Ordered Stop   06/05/14 2000  ceFAZolin (ANCEF) IVPB 1 g/50 mL premix     1 g 100 mL/hr over  30 Minutes Intravenous Every 6 hours 06/05/14 1643 06/06/14 0751   06/05/14 1459  polymyxin B 500,000 Units, bacitracin 50,000 Units in sodium chloride irrigation 0.9 % 500 mL irrigation  Status:  Discontinued       As needed 06/05/14 1459 06/05/14 1535   06/05/14 1137  ceFAZolin (ANCEF) IVPB 2 g/50 mL premix     2 g 100 mL/hr over 30 Minutes Intravenous On call to O.R. 06/05/14 1137 06/05/14 1434     and started on DVT prophylaxis in the form of Aspirin.  Discharge planning consulted to help with postop disposition and equipment needs.  Patient had a fair night on the evening of surgery. Patient was seen in rounds and was ready to go home after discussion of restrictions and follow up instructons.   Diet: Cardiac diet Activity:Wear sling at all times Follow-up:in 2 weeks Disposition - Home Discharged Condition: stable   Discharge Instructions    Call MD / Call 911    Complete by:  As directed   If you experience chest pain or shortness of breath, CALL 911 and be transported to the hospital emergency room.  If you develope a fever above 101 F, pus (white drainage) or increased drainage or redness at the wound, or calf pain, call your surgeon's office.     Constipation Prevention    Complete by:  As directed   Drink plenty of fluids.  Prune juice may be helpful.  You may use a stool softener, such as Colace (over the counter) 100 mg twice a day.  Use MiraLax (over the counter) for constipation as needed.     Diet - low sodium heart healthy    Complete  by:  As directed      Discharge instructions    Complete by:  As directed   Keep your sling on at all times, including sleeping in your sling. The only time you should remove your sling is to shower only but you need to keep your hand against your chest while you shower.  You may shower with the dressing on. Do not remove the dressing unless it appears compromised. Call Dr. Gladstone Lighter if any wound complications or temperature of 101 degrees F or over.  Call the office for an appointment to see Dr. Gladstone Lighter in two weeks: [phone] and ask for Dr. Charlestine Night nurse, Brunilda Payor.     Driving restrictions    Complete by:  As directed   No driving     Increase activity slowly as tolerated    Complete by:  As directed      Lifting restrictions    Complete by:  As directed   No lifting            Medication List    STOP taking these medications        HYDROcodone-homatropine 5-1.5 MG/5ML syrup  Commonly known as:  HYDROMET      TAKE these medications        amLODipine 2.5 MG tablet  Commonly known as:  NORVASC  Take 2.5 mg by mouth every morning.     atenolol 100 MG tablet  Commonly known as:  TENORMIN  Take 100 mg by mouth every morning.     B-COMPLEX PO  Take 1 tablet by mouth daily. 1511m daily     clotrimazole-betamethasone cream  Commonly known as:  LOTRISONE  Apply 1 application topically 2 (two) times daily.     FOLIVANE-PLUS Caps  Take 1 capsule by mouth daily.  HYDROmorphone 2 MG tablet  Commonly known as:  DILAUDID  Take 1 tablet (2 mg total) by mouth every 3 (three) hours as needed for severe pain (Q4-6 hours PRN).     methocarbamol 500 MG tablet  Commonly known as:  ROBAXIN  Take 1 tablet (500 mg total) by mouth every 6 (six) hours as needed for muscle spasms.     multivitamin tablet  Take 1 tablet by mouth daily.     MYRBETRIQ 50 MG Tb24 tablet  Generic drug:  mirabegron ER  Take 50 mg by mouth daily.     omeprazole 40 MG capsule  Commonly  known as:  PRILOSEC  Take 1 capsule (40 mg total) by mouth every evening.     SENOKOT S PO  Take 1 tablet by mouth daily. 2 tabs daily     vitamin C 1000 MG tablet  Take 1,000 mg by mouth daily.           Follow-up Information    Follow up with GIOFFRE,RONALD A, MD. Schedule an appointment as soon as possible for a visit in 2 weeks.   Specialty:  Orthopedic Surgery   Contact information:   [address] [place] [address] [phone]       Signed: Ardeen Jourdain, PA-C Orthopaedic Surgery 06/10/2014, 8:57 AM

## 2014-07-02 ENCOUNTER — Other Ambulatory Visit (HOSPITAL_BASED_OUTPATIENT_CLINIC_OR_DEPARTMENT_OTHER): Payer: Medicare Other

## 2014-07-02 ENCOUNTER — Other Ambulatory Visit: Payer: Self-pay | Admitting: *Deleted

## 2014-07-02 ENCOUNTER — Ambulatory Visit (HOSPITAL_BASED_OUTPATIENT_CLINIC_OR_DEPARTMENT_OTHER): Payer: Medicare Other

## 2014-07-02 VITALS — BP 142/69 | HR 55 | Temp 97.8°F

## 2014-07-02 DIAGNOSIS — C3492 Malignant neoplasm of unspecified part of left bronchus or lung: Secondary | ICD-10-CM

## 2014-07-02 DIAGNOSIS — C3410 Malignant neoplasm of upper lobe, unspecified bronchus or lung: Secondary | ICD-10-CM

## 2014-07-02 DIAGNOSIS — C3411 Malignant neoplasm of upper lobe, right bronchus or lung: Secondary | ICD-10-CM | POA: Diagnosis present

## 2014-07-02 DIAGNOSIS — Z95828 Presence of other vascular implants and grafts: Secondary | ICD-10-CM

## 2014-07-02 LAB — CBC WITH DIFFERENTIAL/PLATELET
BASO%: 0.2 % (ref 0.0–2.0)
Basophils Absolute: 0 10*3/uL (ref 0.0–0.1)
EOS%: 3.3 % (ref 0.0–7.0)
Eosinophils Absolute: 0.3 10*3/uL (ref 0.0–0.5)
HEMATOCRIT: 34.9 % — AB (ref 38.4–49.9)
HEMOGLOBIN: 11.5 g/dL — AB (ref 13.0–17.1)
LYMPH%: 14.8 % (ref 14.0–49.0)
MCH: 30.8 pg (ref 27.2–33.4)
MCHC: 33 g/dL (ref 32.0–36.0)
MCV: 93.6 fL (ref 79.3–98.0)
MONO#: 1 10*3/uL — ABNORMAL HIGH (ref 0.1–0.9)
MONO%: 12.2 % (ref 0.0–14.0)
NEUT%: 69.5 % (ref 39.0–75.0)
NEUTROS ABS: 5.7 10*3/uL (ref 1.5–6.5)
Platelets: 161 10*3/uL (ref 140–400)
RBC: 3.73 10*6/uL — AB (ref 4.20–5.82)
RDW: 13.6 % (ref 11.0–14.6)
WBC: 8.2 10*3/uL (ref 4.0–10.3)
lymph#: 1.2 10*3/uL (ref 0.9–3.3)

## 2014-07-02 LAB — COMPREHENSIVE METABOLIC PANEL (CC13)
ALT: 23 U/L (ref 0–55)
ANION GAP: 10 meq/L (ref 3–11)
AST: 18 U/L (ref 5–34)
Albumin: 3.6 g/dL (ref 3.5–5.0)
Alkaline Phosphatase: 116 U/L (ref 40–150)
BILIRUBIN TOTAL: 0.57 mg/dL (ref 0.20–1.20)
BUN: 19.3 mg/dL (ref 7.0–26.0)
CALCIUM: 9.3 mg/dL (ref 8.4–10.4)
CHLORIDE: 106 meq/L (ref 98–109)
CO2: 23 mEq/L (ref 22–29)
CREATININE: 1.4 mg/dL — AB (ref 0.7–1.3)
EGFR: 51 mL/min/{1.73_m2} — ABNORMAL LOW (ref >90)
Glucose: 101 mg/dl (ref 70–140)
POTASSIUM: 4.2 meq/L (ref 3.5–5.1)
SODIUM: 140 meq/L (ref 136–145)
Total Protein: 7.1 g/dL (ref 6.4–8.3)

## 2014-07-02 MED ORDER — SODIUM CHLORIDE 0.9 % IJ SOLN
10.0000 mL | INTRAMUSCULAR | Status: DC | PRN
  Administered 2014-07-02: 10 mL via INTRAVENOUS
  Filled 2014-07-02: qty 10

## 2014-07-02 MED ORDER — HEPARIN SOD (PORK) LOCK FLUSH 100 UNIT/ML IV SOLN
500.0000 [IU] | Freq: Once | INTRAVENOUS | Status: AC
  Administered 2014-07-02: 500 [IU] via INTRAVENOUS
  Filled 2014-07-02: qty 5

## 2014-07-02 NOTE — Patient Instructions (Signed)

## 2014-08-01 ENCOUNTER — Telehealth: Payer: Self-pay | Admitting: *Deleted

## 2014-08-01 NOTE — Telephone Encounter (Signed)
Just pain medication for now until we repeat the scan. If getting worse, we may need to repeat the scan earlier.

## 2014-08-01 NOTE — Telephone Encounter (Signed)
Called pt per MD pt to continue pain medication until scan results. If pain gets worse prior to Scan, may need to move scan up. Pt verbalized he doesn't really take medication for pain. Pt states I'm not a big fan of drugs, but I do have some pain medicine I can take. No further concerns at this time.

## 2014-08-01 NOTE — Telephone Encounter (Signed)
Patient called stating that he's having abdominal pain since last week. Patient is rating his abdominal pain 2/10 currently with sharp like sensations. Patient knows that he has an upcoming CT scan on 08/21/14 but would like to know if MD [name] recommends anything for his abdominal pain. Message forwarded to MD and RN

## 2014-08-06 ENCOUNTER — Inpatient Hospital Stay (HOSPITAL_COMMUNITY)
Admission: EM | Admit: 2014-08-06 | Discharge: 2014-08-16 | DRG: 829 | Disposition: A | Discharge status: Home Health | Payer: Medicare Other | Attending: Internal Medicine | Admitting: Internal Medicine

## 2014-08-06 ENCOUNTER — Telehealth: Payer: Self-pay | Admitting: *Deleted

## 2014-08-06 ENCOUNTER — Emergency Department (HOSPITAL_COMMUNITY): Payer: Medicare Other

## 2014-08-06 ENCOUNTER — Encounter (HOSPITAL_COMMUNITY): Payer: Self-pay | Admitting: Emergency Medicine

## 2014-08-06 DIAGNOSIS — Z923 Personal history of irradiation: Secondary | ICD-10-CM | POA: Diagnosis not present

## 2014-08-06 DIAGNOSIS — Z9221 Personal history of antineoplastic chemotherapy: Secondary | ICD-10-CM | POA: Diagnosis not present

## 2014-08-06 DIAGNOSIS — R1900 Intra-abdominal and pelvic swelling, mass and lump, unspecified site: Secondary | ICD-10-CM | POA: Diagnosis not present

## 2014-08-06 DIAGNOSIS — I129 Hypertensive chronic kidney disease with stage 1 through stage 4 chronic kidney disease, or unspecified chronic kidney disease: Secondary | ICD-10-CM | POA: Diagnosis present

## 2014-08-06 DIAGNOSIS — Z85118 Personal history of other malignant neoplasm of bronchus and lung: Secondary | ICD-10-CM | POA: Diagnosis not present

## 2014-08-06 DIAGNOSIS — Z79899 Other long term (current) drug therapy: Secondary | ICD-10-CM | POA: Diagnosis not present

## 2014-08-06 DIAGNOSIS — Z8249 Family history of ischemic heart disease and other diseases of the circulatory system: Secondary | ICD-10-CM | POA: Diagnosis not present

## 2014-08-06 DIAGNOSIS — K5900 Constipation, unspecified: Secondary | ICD-10-CM | POA: Diagnosis present

## 2014-08-06 DIAGNOSIS — N400 Enlarged prostate without lower urinary tract symptoms: Secondary | ICD-10-CM | POA: Diagnosis present

## 2014-08-06 DIAGNOSIS — C3492 Malignant neoplasm of unspecified part of left bronchus or lung: Secondary | ICD-10-CM | POA: Diagnosis present

## 2014-08-06 DIAGNOSIS — Z888 Allergy status to other drugs, medicaments and biological substances status: Secondary | ICD-10-CM | POA: Diagnosis not present

## 2014-08-06 DIAGNOSIS — C801 Malignant (primary) neoplasm, unspecified: Secondary | ICD-10-CM | POA: Diagnosis present

## 2014-08-06 DIAGNOSIS — R1013 Epigastric pain: Secondary | ICD-10-CM | POA: Diagnosis present

## 2014-08-06 DIAGNOSIS — R945 Abnormal results of liver function studies: Secondary | ICD-10-CM

## 2014-08-06 DIAGNOSIS — D638 Anemia in other chronic diseases classified elsewhere: Secondary | ICD-10-CM | POA: Diagnosis present

## 2014-08-06 DIAGNOSIS — Z6820 Body mass index (BMI) 20.0-20.9, adult: Secondary | ICD-10-CM

## 2014-08-06 DIAGNOSIS — Z79891 Long term (current) use of opiate analgesic: Secondary | ICD-10-CM

## 2014-08-06 DIAGNOSIS — N183 Chronic kidney disease, stage 3 (moderate): Secondary | ICD-10-CM | POA: Diagnosis not present

## 2014-08-06 DIAGNOSIS — H9190 Unspecified hearing loss, unspecified ear: Secondary | ICD-10-CM | POA: Diagnosis present

## 2014-08-06 DIAGNOSIS — Z87891 Personal history of nicotine dependence: Secondary | ICD-10-CM

## 2014-08-06 DIAGNOSIS — K831 Obstruction of bile duct: Secondary | ICD-10-CM | POA: Diagnosis present

## 2014-08-06 DIAGNOSIS — K838 Other specified diseases of biliary tract: Secondary | ICD-10-CM | POA: Diagnosis not present

## 2014-08-06 DIAGNOSIS — R1010 Upper abdominal pain, unspecified: Secondary | ICD-10-CM

## 2014-08-06 DIAGNOSIS — C480 Malignant neoplasm of retroperitoneum: Principal | ICD-10-CM | POA: Diagnosis present

## 2014-08-06 DIAGNOSIS — N184 Chronic kidney disease, stage 4 (severe): Secondary | ICD-10-CM | POA: Diagnosis not present

## 2014-08-06 DIAGNOSIS — R935 Abnormal findings on diagnostic imaging of other abdominal regions, including retroperitoneum: Secondary | ICD-10-CM | POA: Diagnosis not present

## 2014-08-06 DIAGNOSIS — I10 Essential (primary) hypertension: Secondary | ICD-10-CM | POA: Diagnosis not present

## 2014-08-06 DIAGNOSIS — C3411 Malignant neoplasm of upper lobe, right bronchus or lung: Secondary | ICD-10-CM | POA: Diagnosis not present

## 2014-08-06 DIAGNOSIS — R1084 Generalized abdominal pain: Secondary | ICD-10-CM | POA: Diagnosis not present

## 2014-08-06 DIAGNOSIS — K869 Disease of pancreas, unspecified: Secondary | ICD-10-CM | POA: Diagnosis present

## 2014-08-06 DIAGNOSIS — Z885 Allergy status to narcotic agent status: Secondary | ICD-10-CM | POA: Diagnosis not present

## 2014-08-06 DIAGNOSIS — R17 Unspecified jaundice: Secondary | ICD-10-CM | POA: Diagnosis present

## 2014-08-06 DIAGNOSIS — L299 Pruritus, unspecified: Secondary | ICD-10-CM | POA: Diagnosis present

## 2014-08-06 DIAGNOSIS — E440 Moderate protein-calorie malnutrition: Secondary | ICD-10-CM | POA: Diagnosis not present

## 2014-08-06 DIAGNOSIS — C7989 Secondary malignant neoplasm of other specified sites: Secondary | ICD-10-CM | POA: Diagnosis not present

## 2014-08-06 DIAGNOSIS — D696 Thrombocytopenia, unspecified: Secondary | ICD-10-CM | POA: Diagnosis present

## 2014-08-06 DIAGNOSIS — R64 Cachexia: Secondary | ICD-10-CM | POA: Diagnosis not present

## 2014-08-06 DIAGNOSIS — Z800 Family history of malignant neoplasm of digestive organs: Secondary | ICD-10-CM

## 2014-08-06 DIAGNOSIS — M1990 Unspecified osteoarthritis, unspecified site: Secondary | ICD-10-CM | POA: Diagnosis present

## 2014-08-06 DIAGNOSIS — R100 Acute abdomen: Secondary | ICD-10-CM | POA: Diagnosis not present

## 2014-08-06 DIAGNOSIS — R109 Unspecified abdominal pain: Secondary | ICD-10-CM

## 2014-08-06 DIAGNOSIS — Z881 Allergy status to other antibiotic agents status: Secondary | ICD-10-CM

## 2014-08-06 DIAGNOSIS — R1011 Right upper quadrant pain: Secondary | ICD-10-CM | POA: Diagnosis not present

## 2014-08-06 DIAGNOSIS — N189 Chronic kidney disease, unspecified: Secondary | ICD-10-CM

## 2014-08-06 DIAGNOSIS — R7989 Other specified abnormal findings of blood chemistry: Secondary | ICD-10-CM

## 2014-08-06 LAB — URINALYSIS, ROUTINE W REFLEX MICROSCOPIC
Glucose, UA: NEGATIVE mg/dL
Hgb urine dipstick: NEGATIVE
KETONES UR: NEGATIVE mg/dL
LEUKOCYTES UA: NEGATIVE
NITRITE: NEGATIVE
Protein, ur: NEGATIVE mg/dL
Specific Gravity, Urine: 1.013 (ref 1.005–1.030)
Urobilinogen, UA: 1 mg/dL (ref 0.0–1.0)
pH: 6 (ref 5.0–8.0)

## 2014-08-06 LAB — CBC WITH DIFFERENTIAL/PLATELET
Basophils Absolute: 0 10*3/uL (ref 0.0–0.1)
Basophils Relative: 1 % (ref 0–1)
Eosinophils Absolute: 0.1 10*3/uL (ref 0.0–0.7)
Eosinophils Relative: 2 % (ref 0–5)
HCT: 35.8 % — ABNORMAL LOW (ref 39.0–52.0)
HEMOGLOBIN: 11.6 g/dL — AB (ref 13.0–17.0)
Lymphocytes Relative: 22 % (ref 12–46)
Lymphs Abs: 1.3 10*3/uL (ref 0.7–4.0)
MCH: 30.4 pg (ref 26.0–34.0)
MCHC: 32.4 g/dL (ref 30.0–36.0)
MCV: 94 fL (ref 78.0–100.0)
Monocytes Absolute: 0.9 10*3/uL (ref 0.1–1.0)
Monocytes Relative: 15 % — ABNORMAL HIGH (ref 3–12)
NEUTROS ABS: 3.7 10*3/uL (ref 1.7–7.7)
NEUTROS PCT: 60 % (ref 43–77)
Platelets: 163 10*3/uL (ref 150–400)
RBC: 3.81 MIL/uL — AB (ref 4.22–5.81)
RDW: 14.5 % (ref 11.5–15.5)
WBC: 6.1 10*3/uL (ref 4.0–10.5)

## 2014-08-06 LAB — COMPREHENSIVE METABOLIC PANEL
ALK PHOS: 749 U/L — AB (ref 38–126)
ALT: 520 U/L — ABNORMAL HIGH (ref 17–63)
AST: 178 U/L — ABNORMAL HIGH (ref 15–41)
Albumin: 3.7 g/dL (ref 3.5–5.0)
Anion gap: 7 (ref 5–15)
BUN: 22 mg/dL — AB (ref 6–20)
CO2: 26 mmol/L (ref 22–32)
Calcium: 9.4 mg/dL (ref 8.9–10.3)
Chloride: 105 mmol/L (ref 101–111)
Creatinine, Ser: 1.4 mg/dL — ABNORMAL HIGH (ref 0.61–1.24)
GFR calc Af Amer: 58 mL/min — ABNORMAL LOW (ref >60)
GFR, EST NON AFRICAN AMERICAN: 50 mL/min — AB (ref >60)
GLUCOSE: 117 mg/dL — AB (ref 70–99)
POTASSIUM: 3.6 mmol/L (ref 3.5–5.1)
Sodium: 138 mmol/L (ref 135–145)
TOTAL PROTEIN: 7.5 g/dL (ref 6.5–8.1)
Total Bilirubin: 5 mg/dL — ABNORMAL HIGH (ref 0.3–1.2)

## 2014-08-06 LAB — LIPASE, BLOOD: Lipase: 49 U/L (ref 22–51)

## 2014-08-06 LAB — BILIRUBIN, DIRECT: BILIRUBIN DIRECT: 3.5 mg/dL — AB (ref 0.1–0.5)

## 2014-08-06 MED ORDER — SODIUM CHLORIDE 0.9 % IV BOLUS (SEPSIS)
500.0000 mL | Freq: Once | INTRAVENOUS | Status: AC
  Administered 2014-08-06: 500 mL via INTRAVENOUS

## 2014-08-06 MED ORDER — IOHEXOL 300 MG/ML  SOLN: 80.0000 mL | Freq: Once | INTRAMUSCULAR | Status: AC | PRN

## 2014-08-06 MED ORDER — HYDROMORPHONE HCL 1 MG/ML IJ SOLN
1.0000 mg | Freq: Once | INTRAMUSCULAR | Status: AC
  Administered 2014-08-06: 1 mg via INTRAVENOUS
  Filled 2014-08-06: qty 1

## 2014-08-06 MED ORDER — SODIUM CHLORIDE 0.9 % IV SOLN
INTRAVENOUS | Status: DC
  Administered 2014-08-07: via INTRAVENOUS

## 2014-08-06 MED ORDER — ONDANSETRON HCL 4 MG/2ML IJ SOLN: 4.0000 mg | Freq: Three times a day (TID) | INTRAMUSCULAR | Status: DC | PRN

## 2014-08-06 NOTE — ED Notes (Signed)
Pt stated that he cannot urinate at this time.   Will try again in 1hr.

## 2014-08-06 NOTE — ED Provider Notes (Signed)
CSN: [id number]     Arrival date & time 08/06/14  1841 History   First MD Initiated Contact with Patient 08/06/14 1904     Chief Complaint  Patient presents with  . Abdominal Pain  . 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 Patient was seen in gastroenterology [place] office and was called to come to the ER due to abnormal liver function. This is new for the patient no history of this, mild jaundice recently. No fevers or chills. Mild pain currently. Past smoker. Mild alcohol use, intermittent pain with eating.  Patient is a 69 y.o. male presenting with abdominal pain. The history is provided by the patient.  Abdominal Pain Associated symptoms: nausea and vomiting   Associated symptoms: no chest pain, no chills, no dysuria, no fever and no shortness of breath     Past Medical History  Diagnosis Date  . Hypertension   . Arthritis RIGHT SHOULDER  . Immature cataract BILATERAL  . Non-small cell lung cancer DX SEPT 2010  W/ CHEMORADIATION AT THAT TIME -- NOW  W/ METS--  CURRENTLY ON MAINTENANCE CHEMO TX  EVERY 30 DAYS    ONCOLOGIST- DR [facility]  . Acute meniscal tear of knee RIGHT KNEE  . BPH (benign prostatic hypertrophy)   . Dizziness and giddiness 04/23/2014  . Tremor 04/23/2014    Right hand  . HOH (hard of hearing)     bilateral hearing aids   Past Surgical History  Procedure Laterality Date  . 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 Rotator cuff repair  2008    LEFT SHOULDER  . Bilateral ear drum surgery  1960'S  . Orif left ring finger  and revascularization of radial side  02-16-2007    DEGLOVING INJURY   . Knee arthroscopy  12/21/2011    Procedure: ARTHROSCOPY KNEE;  Surgeon: Tobi Bastos, MD;  Location: [facility];  Service: Orthopedics;  Laterality: Right;  WITH MEDIAL MENISECTOMY  . Cataract extraction Bilateral   . Tonsillectomy      as child  . Ovarian cyst surgery      removed from left jaw area- benign  . Ovarian cyst surgery       NOTE; pt unaware of why this is in his chart  . Shoulder open rotator cuff repair Right 06/05/2014    Procedure: RIGHT ROTATOR CUFF REPAIR SHOULDER OPEN;  Surgeon: Latanya Maudlin, MD;  Location: [facility];  Service: Orthopedics;  Laterality: Right;   Family History  Problem Relation Age of Onset  . Colon cancer    . Heart attack    . Cancer    . Hypertension    . Hyperlipidemia    . Congestive Heart Failure Mother   . Cancer Sister   . Cancer Maternal Grandfather   . Colon cancer Father   . Heart attack Brother    History  Substance Use Topics  . Smoking status: Former Smoker -- 2.00 packs/day for 20 years    Types: Cigarettes    Quit date: 03/17/1981  . Smokeless tobacco: Never Used  . Alcohol Use: 3.5 oz/week    7 Standard drinks or equivalent per week     Comment: beer daily  Review of Systems  Constitutional: Positive for appetite change. Negative for fever and chills.  HENT: Negative for congestion.   Eyes: Negative for visual disturbance.  Respiratory: Negative for shortness of breath.   Cardiovascular: Negative for chest pain.  Gastrointestinal: Positive for nausea, vomiting and abdominal pain.  Genitourinary: Negative for dysuria and flank pain.  Musculoskeletal: Positive for back pain. Negative for neck pain and neck stiffness.  Skin: Negative for rash.  Neurological: Positive for light-headedness. Negative for headaches.      Allergies  Augmentin; Carboplatin; Levaquin; Percocet; and Zolpidem tartrate  Home Medications   Prior to Admission medications   Medication Sig Start Date End Date Taking?  Authorizing Provider  amLODipine (NORVASC) 2.5 MG tablet Take 2.5 mg by mouth every morning.  01/13/11  Yes Historical Provider, MD  ampicillin (PRINCIPEN) 250 MG capsule Take 250 mg by mouth 4 (four) times daily.  07/30/14  Yes Historical Provider, MD  Ascorbic Acid (VITAMIN C) 1000 MG tablet Take 1,000 mg by mouth daily.   Yes Historical Provider, MD  atenolol (TENORMIN) 100 MG tablet Take 100 mg by mouth every morning.    Yes Historical Provider, MD  B Complex-Biotin-FA (B-COMPLEX PO) Take 1 tablet by mouth daily. '1500mg'$  daily   Yes Historical Provider, MD  FeFum-FePoly-FA-B Cmp-C-Biot (FOLIVANE-PLUS) CAPS Take 1 capsule by mouth daily. 11/13/13  Yes [name], MD  HYDROmorphone (DILAUDID) 2 MG tablet Take 1 tablet (2 mg total) by mouth every 3 (three) hours as needed for severe pain (Q4-6 hours PRN). 06/06/14  Yes Amber Cecilio Asper, PA-C  methocarbamol (ROBAXIN) 500 MG tablet Take 1 tablet (500 mg total) by mouth every 6 (six) hours as needed for muscle spasms. 06/06/14  Yes Amber Cecilio Asper, PA-C  Multiple Vitamin (MULTIVITAMIN) tablet Take 1 tablet by mouth daily.    Yes Historical Provider, MD  MYRBETRIQ 50 MG TB24 tablet Take 50 mg by mouth daily. 01/09/14  Yes Historical Provider, MD  omeprazole (PRILOSEC) 40 MG capsule Take 1 capsule (40 mg total) by mouth every evening. 11/13/13  Yes [name], MD  Sennosides-Docusate Sodium (SENOKOT S PO) Take 1 tablet by mouth daily. 2 tabs daily   Yes Historical Provider, MD   BP 174/88 mmHg  Pulse 62  Temp(Src) 97.9 F (36.6 C) (Oral)  Resp 18  SpO2 99% Physical Exam  Constitutional: He is oriented to person, place, and time. He appears well-developed and well-nourished.  HENT:  Head: Normocephalic and atraumatic.  Mild dry mucous membranes  Eyes: Right eye exhibits no discharge. Left eye exhibits no discharge. Scleral icterus is present.  Neck: Normal range of motion. Neck supple. No tracheal deviation present.  Cardiovascular: Normal  rate and regular rhythm.   Pulmonary/Chest: Effort normal and breath sounds normal.  Abdominal: Soft. He exhibits no distension. There is tenderness (mild central tenderness). There is no guarding.  Musculoskeletal: He exhibits no edema.  Neurological: He is alert and oriented to person, place, and time.  Skin: Skin is warm. No rash (jaundice) noted.  Psychiatric: He has a normal mood and affect.  Nursing note and vitals reviewed.   ED Course  Procedures (including critical care time) Labs Review Labs Reviewed  COMPREHENSIVE METABOLIC PANEL - Abnormal; Notable for the following:    Glucose, Bld 117 (*)    BUN 22 (*)    Creatinine, Ser 1.40 (*)    AST 178 (*)    ALT 520 (*)    Alkaline Phosphatase 749 (*)    Total Bilirubin 5.0 (*)  GFR calc non Af Amer 50 (*)    GFR calc Af Amer 58 (*)    All other components within normal limits  CBC WITH DIFFERENTIAL/PLATELET - Abnormal; Notable for the following:    RBC 3.81 (*)    Hemoglobin 11.6 (*)    HCT 35.8 (*)    Monocytes Relative 15 (*)    All other components within normal limits  BILIRUBIN, DIRECT - Abnormal; Notable for the following:    Bilirubin, Direct 3.5 (*)    All other components within normal limits  URINALYSIS, ROUTINE W REFLEX MICROSCOPIC - Abnormal; Notable for the following:    Color, Urine AMBER (*)    Bilirubin Urine MODERATE (*)    All other components within normal limits  LIPASE, BLOOD    Imaging Review Ct Abdomen Pelvis Wo Contrast  08/06/2014   CLINICAL DATA:  Abdominal pain. Abdominal pain with onset of symptoms 1 week ago. History of non-small cell lung carcinoma on maintenance chemotherapy.  EXAM: CT CHEST, ABDOMEN AND PELVIS WITHOUT CONTRAST  TECHNIQUE: Multidetector CT imaging of the chest, abdomen and pelvis was performed following the standard protocol without IV contrast.  COMPARISON:  CT 05/14/2014.  CT 02/05/2014.  FINDINGS: CT CHEST FINDINGS  Musculoskeletal: No thoracic spine compression  fractures. No aggressive osseous lesions.  Lungs: Scarring is present extending to the RIGHT apex with dystrophic calcifications. LEFT pleural apical nodularity is chronic and compatible with scarring. Emphysema.  Central airways: Patent.  Vasculature: Atherosclerosis and coronary artery disease. RIGHT IJ Port-A-Cath. 42 mm ascending aorta.  Effusions: None.  Lymphadenopathy: There is no axillary adenopathy. The high RIGHT pretracheal nodal mass has enlarged and now measures 27 mm x 20 mm (image 19 series 2).  Esophagus: Grossly normal.  CT ABDOMEN AND PELVIS FINDINGS  Musculoskeletal: Lumbar vertebral body height is preserved. No aggressive osseous lesions identified.  Liver: Unenhanced CT was performed per clinician order. Lack of IV contrast limits sensitivity and specificity, especially for evaluation of abdominal/pelvic solid viscera.  There is a mass at the porta hepatis. It is unclear whether this arises from the porta hepatis nodes, the caudate lobe of the liver or the head of the pancreas. Favor a nodal mass or from the head of the pancreas (image 64 series 2). This demonstrates central low attenuation consistent with central necrosis. The mass measures 5 cm x 5 cm on axial imaging and this is a new finding  Spleen:  Normal.  Gallbladder:  Contracted.  No calcified stones.  Common bile duct:  Obscured by a mass in the porta hepatis.  Pancreas: Body and tail the pancreas appear normal. The head and neck are difficult to evaluate because of mass and lack of IV contrast.  Adrenal glands:  Normal bilaterally.  Kidneys:  Within normal limits.  Stomach: Antrum and pylorus anteriorly displaced by the mass in the porta hepatis.  Small bowel:  No small bowel obstruction or inflammatory changes.  Colon:   Within normal limits.  Pelvic Genitourinary:  Normal bladder.  Peritoneum: No free fluid or free air.  Vascular/lymphatic: Tortuous aortoiliac system with dense atherosclerosis. No gross acute abnormality.  Body  Wall: Tiny fat containing periumbilical hernia.  IMPRESSION: 1. Enlarging RIGHT paratracheal nodal mass now measuring 27 mm x 20 mm. This is compatible with malignant adenopathy in a patient with non-small cell lung carcinoma. 2. 42 mm ascending aorta. Recommend annual imaging followup by CTA or MRA. This recommendation follows 2010 ACCF/AHA/AATS/ACR/ASA/SCA/SCAI/SIR/STS/SVM Guidelines for the Diagnosis and Management of Patients with  Thoracic Aortic Disease. Circulation. 2010; 121: K917-H150 3. New 5 cm x 5 cm abdominal mass centered in the porta hepatis, either arising from a node in the region or from the pancreatic head. This probably represents metastatic disease given the increased adenopathy in the chest.   Electronically Signed   By: Dereck Ligas M.D.   On: 08/06/2014 21:05   Ct Chest Wo Contrast  08/06/2014   CLINICAL DATA:  Abdominal pain. Abdominal pain with onset of symptoms 1 week ago. History of non-small cell lung carcinoma on maintenance chemotherapy.  EXAM: CT CHEST, ABDOMEN AND PELVIS WITHOUT CONTRAST  TECHNIQUE: Multidetector CT imaging of the chest, abdomen and pelvis was performed following the standard protocol without IV contrast.  COMPARISON:  CT 05/14/2014.  CT 02/05/2014.  FINDINGS: CT CHEST FINDINGS  Musculoskeletal: No thoracic spine compression fractures. No aggressive osseous lesions.  Lungs: Scarring is present extending to the RIGHT apex with dystrophic calcifications. LEFT pleural apical nodularity is chronic and compatible with scarring. Emphysema.  Central airways: Patent.  Vasculature: Atherosclerosis and coronary artery disease. RIGHT IJ Port-A-Cath. 42 mm ascending aorta.  Effusions: None.  Lymphadenopathy: There is no axillary adenopathy. The high RIGHT pretracheal nodal mass has enlarged and now measures 27 mm x 20 mm (image 19 series 2).  Esophagus: Grossly normal.  CT ABDOMEN AND PELVIS FINDINGS  Musculoskeletal: Lumbar vertebral body height is preserved. No aggressive  osseous lesions identified.  Liver: Unenhanced CT was performed per clinician order. Lack of IV contrast limits sensitivity and specificity, especially for evaluation of abdominal/pelvic solid viscera.  There is a mass at the porta hepatis. It is unclear whether this arises from the porta hepatis nodes, the caudate lobe of the liver or the head of the pancreas. Favor a nodal mass or from the head of the pancreas (image 64 series 2). This demonstrates central low attenuation consistent with central necrosis. The mass measures 5 cm x 5 cm on axial imaging and this is a new finding  Spleen:  Normal.  Gallbladder:  Contracted.  No calcified stones.  Common bile duct:  Obscured by a mass in the porta hepatis.  Pancreas: Body and tail the pancreas appear normal. The head and neck are difficult to evaluate because of mass and lack of IV contrast.  Adrenal glands:  Normal bilaterally.  Kidneys:  Within normal limits.  Stomach: Antrum and pylorus anteriorly displaced by the mass in the porta hepatis.  Small bowel:  No small bowel obstruction or inflammatory changes.  Colon:   Within normal limits.  Pelvic Genitourinary:  Normal bladder.  Peritoneum: No free fluid or free air.  Vascular/lymphatic: Tortuous aortoiliac system with dense atherosclerosis. No gross acute abnormality.  Body Wall: Tiny fat containing periumbilical hernia.  IMPRESSION: 1. Enlarging RIGHT paratracheal nodal mass now measuring 27 mm x 20 mm. This is compatible with malignant adenopathy in a patient with non-small cell lung carcinoma. 2. 42 mm ascending aorta. Recommend annual imaging followup by CTA or MRA. This recommendation follows 2010 ACCF/AHA/AATS/ACR/ASA/SCA/SCAI/SIR/STS/SVM Guidelines for the Diagnosis and Management of Patients with Thoracic Aortic Disease. Circulation. 2010; 121: V697-X480 3. New 5 cm x 5 cm abdominal mass centered in the porta hepatis, either arising from a node in the region or from the pancreatic head. This probably  represents metastatic disease given the increased adenopathy in the chest.   Electronically Signed   By: Dereck Ligas M.D.   On: 08/06/2014 21:05     EKG Interpretation None  MDM   Final diagnoses:  CRF (chronic renal failure), unspecified stage  LFT elevation  Elevated bilirubin  Upper abdominal pain  Jaundice  Bronchogenic cancer of left lung   Patient presents with concern for obstructive jaundice likely from cancer with history of malignancy. CT scan results reviewed consistent with 5 x 5 cm mass. Discussed with gastroenterology on-call for [place] will see the patient the morning. Discussed with [facility] hospitalist for admission. Pain meds in the ER and IV fluids.  The patients results and plan were reviewed and discussed.   Any x-rays performed were personally reviewed by myself.   Differential diagnosis were considered with the presenting HPI.  Medications  iohexol (OMNIPAQUE) 300 MG/ML solution 80 mL (not administered)  0.9 %  sodium chloride infusion (not administered)  ondansetron (ZOFRAN) injection 4 mg (not administered)  sodium chloride 0.9 % bolus 500 mL (500 mLs Intravenous New Bag/Given 08/06/14 2120)  HYDROmorphone (DILAUDID) injection 1 mg (1 mg Intravenous Given 08/06/14 2139)    Filed Vitals:   08/06/14 1853  BP: 174/88  Pulse: 62  Temp: 97.9 F (36.6 C)  TempSrc: Oral  Resp: 18  SpO2: 99%    Final diagnoses:  CRF (chronic renal failure), unspecified stage  LFT elevation  Elevated bilirubin  Upper abdominal pain  Jaundice  Bronchogenic cancer of left lung    Admission/ observation were discussed with the admitting physician, patient and/or family and they are comfortable with the plan.      Elnora Morrison, MD 08/06/14 2217

## 2014-08-06 NOTE — ED Notes (Signed)
Pt states his abdominal pain started a week ago. Went to his PCP where they did blood work and gave him some medicine to help ease the symptoms. Blood work came back to show an elevated bilirubin, and PCP stated to go to the ER. Abdominal pain a 6/10 at this time, pt does not appear to be in any obvious distress. Mild yellowing of the skin and sclera.

## 2014-08-06 NOTE — Telephone Encounter (Signed)
Message received from Triage, POF to scheduling to move scan to sooner date.

## 2014-08-06 NOTE — Telephone Encounter (Signed)
TC from patient stating he spoke with Dr. Collene Mares, who conferred with Dr. [name]. They both agree to re-schedule pt's next scan to a sooner date than is currently scheduled (08/21/14)

## 2014-08-06 NOTE — ED Notes (Signed)
Pt has back pain 4/10 ,

## 2014-08-06 NOTE — ED Notes (Signed)
Right chest port accessed, 19 gauge used, good blood return and flushed . Pt tolerated well. Noticed that there is no documentation on port placement in pt's chart, yet in chart review, it shows that it was placed on 10/2013

## 2014-08-07 ENCOUNTER — Telehealth: Payer: Self-pay | Admitting: Internal Medicine

## 2014-08-07 ENCOUNTER — Inpatient Hospital Stay (HOSPITAL_COMMUNITY): Payer: Medicare Other

## 2014-08-07 ENCOUNTER — Encounter (HOSPITAL_COMMUNITY): Payer: Self-pay | Admitting: Internal Medicine

## 2014-08-07 ENCOUNTER — Telehealth: Payer: Self-pay | Admitting: *Deleted

## 2014-08-07 DIAGNOSIS — K838 Other specified diseases of biliary tract: Secondary | ICD-10-CM

## 2014-08-07 DIAGNOSIS — E440 Moderate protein-calorie malnutrition: Secondary | ICD-10-CM | POA: Insufficient documentation

## 2014-08-07 DIAGNOSIS — R1084 Generalized abdominal pain: Secondary | ICD-10-CM

## 2014-08-07 DIAGNOSIS — Z85118 Personal history of other malignant neoplasm of bronchus and lung: Secondary | ICD-10-CM

## 2014-08-07 DIAGNOSIS — R17 Unspecified jaundice: Secondary | ICD-10-CM

## 2014-08-07 DIAGNOSIS — N183 Chronic kidney disease, stage 3 (moderate): Secondary | ICD-10-CM

## 2014-08-07 DIAGNOSIS — K831 Obstruction of bile duct: Secondary | ICD-10-CM | POA: Diagnosis present

## 2014-08-07 DIAGNOSIS — I10 Essential (primary) hypertension: Secondary | ICD-10-CM

## 2014-08-07 DIAGNOSIS — R1011 Right upper quadrant pain: Secondary | ICD-10-CM

## 2014-08-07 DIAGNOSIS — R935 Abnormal findings on diagnostic imaging of other abdominal regions, including retroperitoneum: Secondary | ICD-10-CM

## 2014-08-07 DIAGNOSIS — N189 Chronic kidney disease, unspecified: Secondary | ICD-10-CM

## 2014-08-07 LAB — BASIC METABOLIC PANEL
Anion gap: 11 (ref 5–15)
BUN: 17 mg/dL (ref 6–20)
CO2: 25 mmol/L (ref 22–32)
Calcium: 9 mg/dL (ref 8.9–10.3)
Chloride: 104 mmol/L (ref 101–111)
Creatinine, Ser: 1.25 mg/dL — ABNORMAL HIGH (ref 0.61–1.24)
GFR calc Af Amer: >60 mL/min (ref >60)
GFR calc non Af Amer: 57 mL/min — ABNORMAL LOW (ref >60)
Glucose, Bld: 115 mg/dL — ABNORMAL HIGH (ref 70–99)
Potassium: 3.7 mmol/L (ref 3.5–5.1)
SODIUM: 140 mmol/L (ref 135–145)

## 2014-08-07 LAB — GLUCOSE, CAPILLARY
Glucose-Capillary: 147 mg/dL — ABNORMAL HIGH (ref 70–99)
Glucose-Capillary: 100 mg/dL — ABNORMAL HIGH (ref 70–99)

## 2014-08-07 LAB — PROTIME-INR
INR: 1.04 (ref 0.00–1.49)
Prothrombin Time: 13.8 seconds (ref 11.6–15.2)

## 2014-08-07 LAB — CBC WITH DIFFERENTIAL/PLATELET
Basophils Absolute: 0 10*3/uL (ref 0.0–0.1)
Basophils Relative: 0 % (ref 0–1)
Eosinophils Absolute: 0.1 10*3/uL (ref 0.0–0.7)
Eosinophils Relative: 2 % (ref 0–5)
HCT: 31 % — ABNORMAL LOW (ref 39.0–52.0)
Hemoglobin: 10.4 g/dL — ABNORMAL LOW (ref 13.0–17.0)
LYMPHS PCT: 20 % (ref 12–46)
Lymphs Abs: 1.2 10*3/uL (ref 0.7–4.0)
MCH: 31.3 pg (ref 26.0–34.0)
MCHC: 33.5 g/dL (ref 30.0–36.0)
MCV: 93.4 fL (ref 78.0–100.0)
Monocytes Absolute: 0.9 10*3/uL (ref 0.1–1.0)
Monocytes Relative: 16 % — ABNORMAL HIGH (ref 3–12)
NEUTROS PCT: 62 % (ref 43–77)
Neutro Abs: 3.6 10*3/uL (ref 1.7–7.7)
PLATELETS: 148 10*3/uL — AB (ref 150–400)
RBC: 3.32 MIL/uL — AB (ref 4.22–5.81)
RDW: 14.5 % (ref 11.5–15.5)
WBC: 5.9 10*3/uL (ref 4.0–10.5)

## 2014-08-07 LAB — HEPATIC FUNCTION PANEL
ALT: 425 U/L — ABNORMAL HIGH (ref 17–63)
AST: 153 U/L — ABNORMAL HIGH (ref 15–41)
Albumin: 3.2 g/dL — ABNORMAL LOW (ref 3.5–5.0)
Alkaline Phosphatase: 667 U/L — ABNORMAL HIGH (ref 38–126)
BILIRUBIN TOTAL: 5.6 mg/dL — AB (ref 0.3–1.2)
Bilirubin, Direct: 3.8 mg/dL — ABNORMAL HIGH (ref 0.1–0.5)
Indirect Bilirubin: 1.8 mg/dL — ABNORMAL HIGH (ref 0.3–0.9)
Total Protein: 6.6 g/dL (ref 6.5–8.1)

## 2014-08-07 MED ORDER — MIRABEGRON ER 50 MG PO TB24
50.0000 mg | ORAL_TABLET | Freq: Every day | ORAL | Status: DC
  Administered 2014-08-07 – 2014-08-16 (×9): 50 mg via ORAL
  Filled 2014-08-07 (×10): qty 1

## 2014-08-07 MED ORDER — METHOCARBAMOL 500 MG PO TABS: 500.0000 mg | ORAL_TABLET | Freq: Four times a day (QID) | ORAL | Status: DC | PRN

## 2014-08-07 MED ORDER — DEXTROSE-NACL 5-0.9 % IV SOLN
INTRAVENOUS | Status: AC
  Administered 2014-08-07: 1000 mL via INTRAVENOUS
  Administered 2014-08-07: 01:00:00 via INTRAVENOUS

## 2014-08-07 MED ORDER — ATENOLOL 100 MG PO TABS
100.0000 mg | ORAL_TABLET | Freq: Every morning | ORAL | Status: DC
  Administered 2014-08-07 – 2014-08-16 (×9): 100 mg via ORAL
  Filled 2014-08-07 (×10): qty 1

## 2014-08-07 MED ORDER — ACETAMINOPHEN 650 MG RE SUPP: 650.0000 mg | Freq: Four times a day (QID) | RECTAL | Status: DC | PRN

## 2014-08-07 MED ORDER — ACETAMINOPHEN 325 MG PO TABS: 650.0000 mg | ORAL_TABLET | Freq: Four times a day (QID) | ORAL | Status: DC | PRN

## 2014-08-07 MED ORDER — AMPICILLIN 250 MG PO CAPS
250.0000 mg | ORAL_CAPSULE | Freq: Four times a day (QID) | ORAL | Status: DC
  Administered 2014-08-07 (×2): 250 mg via ORAL
  Filled 2014-08-07 (×4): qty 1

## 2014-08-07 MED ORDER — HYDROMORPHONE HCL 1 MG/ML IJ SOLN
1.0000 mg | INTRAMUSCULAR | Status: DC | PRN
  Administered 2014-08-07 – 2014-08-11 (×13): 1 mg via INTRAVENOUS
  Filled 2014-08-07 (×12): qty 1

## 2014-08-07 MED ORDER — ONDANSETRON HCL 4 MG/2ML IJ SOLN: 4.0000 mg | Freq: Four times a day (QID) | INTRAMUSCULAR | Status: DC | PRN

## 2014-08-07 MED ORDER — PANTOPRAZOLE SODIUM 40 MG PO TBEC
40.0000 mg | DELAYED_RELEASE_TABLET | Freq: Every day | ORAL | Status: DC
Start: 2014-08-07 — End: 2014-08-16
  Administered 2014-08-07 – 2014-08-16 (×9): 40 mg via ORAL
  Filled 2014-08-07 (×10): qty 1

## 2014-08-07 MED ORDER — ONDANSETRON HCL 4 MG PO TABS: 4.0000 mg | ORAL_TABLET | Freq: Four times a day (QID) | ORAL | Status: DC | PRN

## 2014-08-07 MED ORDER — ADULT MULTIVITAMIN W/MINERALS CH
ORAL_TABLET | Freq: Every day | ORAL | Status: DC
  Administered 2014-08-07 – 2014-08-16 (×9): 1 via ORAL
  Filled 2014-08-07 (×10): qty 1

## 2014-08-07 MED ORDER — AMLODIPINE BESYLATE 2.5 MG PO TABS
2.5000 mg | ORAL_TABLET | Freq: Every morning | ORAL | Status: DC
  Administered 2014-08-07 – 2014-08-16 (×9): 2.5 mg via ORAL
  Filled 2014-08-07 (×10): qty 1

## 2014-08-07 NOTE — Progress Notes (Signed)
Initial Nutrition Assessment  DOCUMENTATION CODES:  Non-severe (moderate) malnutrition in context of chronic illness  INTERVENTION:  Provide [employer] Instant Breakfast- each provides 220 kcal and 13 g of protein. Provide Magic cup TID with meals, each supplement provides 290 kcal and 9 grams of protein  Encourage PO intake RD to continue to monitor  NUTRITION DIAGNOSIS:  Malnutrition related to chronic illness as evidenced by energy intake < or equal to 75% for > or equal to 1 month, mild depletion of muscle mass.  GOAL:  Patient will meet greater than or equal to 90% of their needs   MONITOR:  PO intake, Supplement acceptance, Labs, Weight trends, Skin, I & O's  REASON FOR ASSESSMENT:  Malnutrition Screening Tool    ASSESSMENT: 69 y.o. male with known history of metastatic non-small cell lung cancer presently under observation has been experiencing abdominal pain over the last 1 week. Patient also has been having poor appetite with at least 10 pounds weight loss over the last 1 month.  Pt in room with wife at bedside. Pt just finished breakfast tray, ate 100%. Pt states he had pancakes and scrambled eggs. PTA pt was eating less d/t poor appetite and abdominal pain. Pt was able to tolerate eggs, pudding, milk, ice cream.  Pt with 7 lb weight loss over the last 2 months (5% weight loss x 2 months, insignificant for time frame).  Pt's wife states that he will drink Ensure supplements but only if they are regular Ensure (he does not like Ensure Complete, Plus or Enlive). Pt willing to try [employer] drinks and Magic cups. RD to order.   Labs reviewed:  Elevated LFTs Elevated Creatinine  Height:  Ht Readings from Last 1 Encounters:  08/06/14 '5\' 8"'$  (1.727 m)    Weight:  Wt Readings from Last 1 Encounters:  08/06/14 133 lb 2.5 oz (60.4 kg)    Ideal Body Weight:  70 kg  Wt Readings from Last 10 Encounters:  08/06/14 133 lb 2.5 oz (60.4 kg)   06/03/14 140 lb (63.504 kg)  05/21/14 140 lb 1.6 oz (63.549 kg)  04/23/14 138 lb 9.6 oz (62.869 kg)  02/12/14 135 lb 3.2 oz (61.326 kg)  11/13/13 140 lb 9.6 oz (63.776 kg)  08/07/13 137 lb 1.6 oz (62.188 kg)  07/02/13 140 lb 6.4 oz (63.685 kg)  06/05/13 135 lb 6.4 oz (61.417 kg)  06/01/13 138 lb 12.8 oz (62.959 kg)    BMI:  Body mass index is 20.25 kg/(m^2).  Estimated Nutritional Needs:  Kcal:  1600-1800  Protein:  75-85g  Fluid:  1.6L/day    Skin:  Reviewed, no issues  Diet Order:  Diet regular Room service appropriate?: Yes; Fluid consistency:: Thin  EDUCATION NEEDS:  No education needs identified at this time   Intake/Output Summary (Last 24 hours) at 08/07/14 1035 Last data filed at 08/07/14 0514  Gross per 24 hour  Intake      0 ml  Output    275 ml  Net   -275 ml    Last BM:  5/9  Clayton Bibles, MS, RD, LDN Pager: [phone] After Hours Pager: [phone]

## 2014-08-07 NOTE — Progress Notes (Signed)
DIAGNOSIS AND DIAGNOSIS: Metastatic non-small cell lung cancer adenocarcinoma diagnosed in September 2010   PRIOR THERAPY:  #1 status post 6 cycles of systemic chemotherapy with carboplatin, Alimta and Avastin given every 3 weeks last dose given 04/23/2009 with disease stabilization.  #2 status post palliative radiotherapy to the mediastinum under the care of Dr. Pablo Ledger. The patient received a total dose of 3000 cGY and 12 fractions completed 01/21/2009.  #3 Maintenance chemotherapy with Alimta at 500 mg per meter squared and Avastin at 15 mg per kilogram given every 3 weeks status post 35 cycles.  #4 Maintenance chemotherapy with Alimta 500 mg/M2 and Avastin 15 mg/kg every 4 weeks,status post 17 cycles, discontinued secondary to disease progression.  #5 Systemic chemotherapy with carboplatin for AUC of 5, Alimta 500 mg/M2 and Avastin 15 mg/kg every 3 weeks, status post 3 cycles, last cycle was given 12/28/2012. Carboplatin was discontinued starting cycle #2 secondary to hypersensitivity reaction. #6 Maintenance chemotherapy again with Alimta 500 mg/M2 and Avastin 15 mg/kg every 3 weeks, first cycle 01/23/2013. Status post 6 cycles.  CURRENT THERAPY: Observation  CHEMOTHERAPY INTENT: palliative/maintenance.  CURRENT # OF CHEMOTHERAPY CYCLES: 0 CURRENT ANTIEMETICS: Compazine when necessary but clearly used.  CURRENT SMOKING STATUS: current nonsmoker  ORAL CHEMOTHERAPY AND CONSENT: None.  CURRENT BISPHOSPHONATES USE: None.  PAIN MANAGEMENT: No Pain  NARCOTICS INDUCED CONSTIPATION: N/A  LIVING WILL AND CODE STATUS: NCB   Subjective: Mr. [name] is seen and examined today. He is feeling a little bit better but continues to have jaundice and pain on the right upper quadrant of his abdomen. He has a history of stage IV non-small cell lung cancer, adenocarcinoma diagnosed in September 2010 and has been on several treatment regimen including maintenance treatment with Alimta and  Avastin which was discontinued based on his request because of increasing fatigue and weakness.The patient has been observation for the last 15 months with no significant evidence for disease progression. He was last seen in February 2016 and CT scan of the chest, abdomen and pelvis at that time showed no significant findings for disease progression. Over the last 1-2 weeks the patient has been complaining of bloating as well as abdominal pain. He was seen by his primary care physician and treated with proton pump inhibitor was no improvement. He was referred to Dr. Collene [name] yesterday for consideration of upper endoscopy. During the visit he was noted to be jaundiced and repeat comprehensive metabolic panel showed significant elevation of the alkaline phosphatase and liver enzymes. He was admitted to [facility] for further evaluation and management of his condition. CT scan of the chest, abdomen and pelvis performed yesterday showed evidence for disease progression and the chest as well as a large mass between the pancreas and the duodenum. MRI of the abdomen confirmed this finding. He denied having any other significant complaints today.  Objective: Vital signs in last 24 hours: Temp:  [97.9 F (36.6 C)-98.1 F (36.7 C)] 98.1 F (36.7 C) (05/11 1430) Pulse Rate:  [60-70] 70 (05/11 1430) Resp:  [16-18] 18 (05/11 1430) BP: (105-174)/(71-88) 140/72 mmHg (05/11 1430) SpO2:  [99 %-100 %] 100 % (05/11 1430) Weight:  [133 lb 2.5 oz (60.4 kg)] 133 lb 2.5 oz (60.4 kg) (05/10 2247)  Intake/Output from previous day: 05/10 0701 - 05/11 0700 In: -  Out: 275 [Urine:275] Intake/Output this shift: Total I/O In: -  Out: 900 [Urine:900]  General appearance: alert, cooperative, fatigued, icteric and no distress Resp: clear to auscultation bilaterally Cardio: regular rate and  rhythm, S1, S2 normal, no murmur, click, rub or gallop GI: Tenderness to palpation in the epigastric and right upper  quadrant Extremities: extremities normal, atraumatic, no cyanosis or edema  Lab Results:   Recent Labs  08/06/14 2001 08/07/14 0530  WBC 6.1 5.9  HGB 11.6* 10.4*  HCT 35.8* 31.0*  PLT 163 148*   BMET  Recent Labs  08/06/14 2001 08/07/14 0530  NA 138 140  K 3.6 3.7  CL 105 104  CO2 26 25  GLUCOSE 117* 115*  BUN 22* 17  CREATININE 1.40* 1.25*  CALCIUM 9.4 9.0    Studies/Results: Ct Abdomen Pelvis Wo Contrast  08/06/2014   CLINICAL DATA:  Abdominal pain. Abdominal pain with onset of symptoms 1 week ago. History of non-small cell lung carcinoma on maintenance chemotherapy.  EXAM: CT CHEST, ABDOMEN AND PELVIS WITHOUT CONTRAST  TECHNIQUE: Multidetector CT imaging of the chest, abdomen and pelvis was performed following the standard protocol without IV contrast.  COMPARISON:  CT 05/14/2014.  CT 02/05/2014.  FINDINGS: CT CHEST FINDINGS  Musculoskeletal: No thoracic spine compression fractures. No aggressive osseous lesions.  Lungs: Scarring is present extending to the RIGHT apex with dystrophic calcifications. LEFT pleural apical nodularity is chronic and compatible with scarring. Emphysema.  Central airways: Patent.  Vasculature: Atherosclerosis and coronary artery disease. RIGHT IJ Port-A-Cath. 42 mm ascending aorta.  Effusions: None.  Lymphadenopathy: There is no axillary adenopathy. The high RIGHT pretracheal nodal mass has enlarged and now measures 27 mm x 20 mm (image 19 series 2).  Esophagus: Grossly normal.  CT ABDOMEN AND PELVIS FINDINGS  Musculoskeletal: Lumbar vertebral body height is preserved. No aggressive osseous lesions identified.  Liver: Unenhanced CT was performed per clinician order. Lack of IV contrast limits sensitivity and specificity, especially for evaluation of abdominal/pelvic solid viscera.  There is a mass at the porta hepatis. It is unclear whether this arises from the porta hepatis nodes, the caudate lobe of the liver or the head of the pancreas. Favor a nodal  mass or from the head of the pancreas (image 64 series 2). This demonstrates central low attenuation consistent with central necrosis. The mass measures 5 cm x 5 cm on axial imaging and this is a new finding  Spleen:  Normal.  Gallbladder:  Contracted.  No calcified stones.  Common bile duct:  Obscured by a mass in the porta hepatis.  Pancreas: Body and tail the pancreas appear normal. The head and neck are difficult to evaluate because of mass and lack of IV contrast.  Adrenal glands:  Normal bilaterally.  Kidneys:  Within normal limits.  Stomach: Antrum and pylorus anteriorly displaced by the mass in the porta hepatis.  Small bowel:  No small bowel obstruction or inflammatory changes.  Colon:   Within normal limits.  Pelvic Genitourinary:  Normal bladder.  Peritoneum: No free fluid or free air.  Vascular/lymphatic: Tortuous aortoiliac system with dense atherosclerosis. No gross acute abnormality.  Body Wall: Tiny fat containing periumbilical hernia.  IMPRESSION: 1. Enlarging RIGHT paratracheal nodal mass now measuring 27 mm x 20 mm. This is compatible with malignant adenopathy in a patient with non-small cell lung carcinoma. 2. 42 mm ascending aorta. Recommend annual imaging followup by CTA or MRA. This recommendation follows 2010 ACCF/AHA/AATS/ACR/ASA/SCA/SCAI/SIR/STS/SVM Guidelines for the Diagnosis and Management of Patients with Thoracic Aortic Disease. Circulation. 2010; 121: V616-W737 3. New 5 cm x 5 cm abdominal mass centered in the porta hepatis, either arising from a node in the region or from the pancreatic  head. This probably represents metastatic disease given the increased adenopathy in the chest.   Electronically Signed   By: Dereck Ligas M.D.   On: 08/06/2014 21:05   Ct Chest Wo Contrast  08/06/2014   CLINICAL DATA:  Abdominal pain. Abdominal pain with onset of symptoms 1 week ago. History of non-small cell lung carcinoma on maintenance chemotherapy.  EXAM: CT CHEST, ABDOMEN AND PELVIS WITHOUT  CONTRAST  TECHNIQUE: Multidetector CT imaging of the chest, abdomen and pelvis was performed following the standard protocol without IV contrast.  COMPARISON:  CT 05/14/2014.  CT 02/05/2014.  FINDINGS: CT CHEST FINDINGS  Musculoskeletal: No thoracic spine compression fractures. No aggressive osseous lesions.  Lungs: Scarring is present extending to the RIGHT apex with dystrophic calcifications. LEFT pleural apical nodularity is chronic and compatible with scarring. Emphysema.  Central airways: Patent.  Vasculature: Atherosclerosis and coronary artery disease. RIGHT IJ Port-A-Cath. 42 mm ascending aorta.  Effusions: None.  Lymphadenopathy: There is no axillary adenopathy. The high RIGHT pretracheal nodal mass has enlarged and now measures 27 mm x 20 mm (image 19 series 2).  Esophagus: Grossly normal.  CT ABDOMEN AND PELVIS FINDINGS  Musculoskeletal: Lumbar vertebral body height is preserved. No aggressive osseous lesions identified.  Liver: Unenhanced CT was performed per clinician order. Lack of IV contrast limits sensitivity and specificity, especially for evaluation of abdominal/pelvic solid viscera.  There is a mass at the porta hepatis. It is unclear whether this arises from the porta hepatis nodes, the caudate lobe of the liver or the head of the pancreas. Favor a nodal mass or from the head of the pancreas (image 64 series 2). This demonstrates central low attenuation consistent with central necrosis. The mass measures 5 cm x 5 cm on axial imaging and this is a new finding  Spleen:  Normal.  Gallbladder:  Contracted.  No calcified stones.  Common bile duct:  Obscured by a mass in the porta hepatis.  Pancreas: Body and tail the pancreas appear normal. The head and neck are difficult to evaluate because of mass and lack of IV contrast.  Adrenal glands:  Normal bilaterally.  Kidneys:  Within normal limits.  Stomach: Antrum and pylorus anteriorly displaced by the mass in the porta hepatis.  Small bowel:  No small  bowel obstruction or inflammatory changes.  Colon:   Within normal limits.  Pelvic Genitourinary:  Normal bladder.  Peritoneum: No free fluid or free air.  Vascular/lymphatic: Tortuous aortoiliac system with dense atherosclerosis. No gross acute abnormality.  Body Wall: Tiny fat containing periumbilical hernia.  IMPRESSION: 1. Enlarging RIGHT paratracheal nodal mass now measuring 27 mm x 20 mm. This is compatible with malignant adenopathy in a patient with non-small cell lung carcinoma. 2. 42 mm ascending aorta. Recommend annual imaging followup by CTA or MRA. This recommendation follows 2010 ACCF/AHA/AATS/ACR/ASA/SCA/SCAI/SIR/STS/SVM Guidelines for the Diagnosis and Management of Patients with Thoracic Aortic Disease. Circulation. 2010; 121: V785-Y850 3. New 5 cm x 5 cm abdominal mass centered in the porta hepatis, either arising from a node in the region or from the pancreatic head. This probably represents metastatic disease given the increased adenopathy in the chest.   Electronically Signed   By: Dereck Ligas M.D.   On: 08/06/2014 21:05   Mr Abdomen Mrcp Wo Cm  08/07/2014   CLINICAL DATA:  Evaluate abdominal mass  EXAM: MRI ABDOMEN WITHOUT CONTRAST  (INCLUDING MRCP)  TECHNIQUE: Multiplanar multisequence MR imaging of the abdomen was performed. Heavily T2-weighted images of the biliary and pancreatic  ducts were obtained, and three-dimensional MRCP images were rendered by post processing.  COMPARISON:  08/06/2014  FINDINGS: Lower chest:  No pleural or pericardial effusion noted.  Hepatobiliary: No focal liver abnormality identified. There is intrahepatic bile duct dilatation. There is fusiform dilatation of the common bile duct which measures up to 11 mm, image 24/series 10. The gallbladder appears normal. There is a large round mass within the right upper quadrant of the abdomen situated between the descending duodenum and head of pancreas. This measures 4.8 x 4.8 x 5.1 cm. This has mass effect upon the  distal common bile duct, portal vein at the venous confluence, IVC and descending duodenum.  Pancreas: There is no abnormal dilatation of the pancreatic duct. Body and tail of pancreas appear normal.  Spleen: Negative.  Adrenals/Urinary Tract: The adrenal glands are both normal. Normal appearance of the right kidney. Cyst in the left kidney measures 1.4 cm.  Stomach/Bowel: Unremarkable appearance of the stomach. Right upper quadrant mass exerts mass effect upon the descending duodenum. The visualize jejunum and colon are unremarkable.  Vascular/Lymphatic: Calcified atherosclerotic plaque involves the abdominal aorta. Multiple enlarged upper abdominal lymph nodes are identified. Index periaortic lymph node measures 1.3 cm, image 40/series 10.  Other: No free fluid or fluid collections identified within the upper abdomen.  Musculoskeletal: Normal signal from within the bone marrow.  IMPRESSION: 1. Large mass within the right upper quadrant of the abdomen is of on certain origin. This is situated between the head of pancreas and descending duodenum. This likely represents either a large nodal mass or lesion arising from the head of pancreas. The mass is partially obstructing the common bile duct and exerts mass effect upon the portal venous confluence, IVC, and descending duodenum. 2. Metastatic adenopathy noted within the left periaortic region.   Electronically Signed   By: Kerby Moors M.D.   On: 08/07/2014 09:58   Mr 3d Recon At Scanner  08/07/2014   CLINICAL DATA:  Evaluate abdominal mass  EXAM: MRI ABDOMEN WITHOUT CONTRAST  (INCLUDING MRCP)  TECHNIQUE: Multiplanar multisequence MR imaging of the abdomen was performed. Heavily T2-weighted images of the biliary and pancreatic ducts were obtained, and three-dimensional MRCP images were rendered by post processing.  COMPARISON:  08/06/2014  FINDINGS: Lower chest:  No pleural or pericardial effusion noted.  Hepatobiliary: No focal liver abnormality identified.  There is intrahepatic bile duct dilatation. There is fusiform dilatation of the common bile duct which measures up to 11 mm, image 24/series 10. The gallbladder appears normal. There is a large round mass within the right upper quadrant of the abdomen situated between the descending duodenum and head of pancreas. This measures 4.8 x 4.8 x 5.1 cm. This has mass effect upon the distal common bile duct, portal vein at the venous confluence, IVC and descending duodenum.  Pancreas: There is no abnormal dilatation of the pancreatic duct. Body and tail of pancreas appear normal.  Spleen: Negative.  Adrenals/Urinary Tract: The adrenal glands are both normal. Normal appearance of the right kidney. Cyst in the left kidney measures 1.4 cm.  Stomach/Bowel: Unremarkable appearance of the stomach. Right upper quadrant mass exerts mass effect upon the descending duodenum. The visualize jejunum and colon are unremarkable.  Vascular/Lymphatic: Calcified atherosclerotic plaque involves the abdominal aorta. Multiple enlarged upper abdominal lymph nodes are identified. Index periaortic lymph node measures 1.3 cm, image 40/series 10.  Other: No free fluid or fluid collections identified within the upper abdomen.  Musculoskeletal: Normal signal from within the bone  marrow.  IMPRESSION: 1. Large mass within the right upper quadrant of the abdomen is of on certain origin. This is situated between the head of pancreas and descending duodenum. This likely represents either a large nodal mass or lesion arising from the head of pancreas. The mass is partially obstructing the common bile duct and exerts mass effect upon the portal venous confluence, IVC, and descending duodenum. 2. Metastatic adenopathy noted within the left periaortic region.   Electronically Signed   By: Kerby Moors M.D.   On: 08/07/2014 09:58    Medications: I have reviewed the patient's current medications.  CODE STATUS: No CODE BLUE  Assessment/Plan: This is a  very pleasant 69 years old white male with metastatic non-small cell lung cancer initially diagnosed in September 2010 status post several chemotherapy regimens and has been observation for the last 15 months. Unfortunately the recent CT scan of the chest, abdomen and pelvis showed some evidence for disease progression in the chest but there was a new large mass located between the pancreatic head and the duodenum. This finding is concerning for metastatic lung cancer versus a lymphoproliferative disorder like lymphoma. I had a lengthy discussion with the patient today about his condition. He is scheduled for upper endoscopy later this week. I also recommended for the patient to undergo a CT-guided core biopsy of the large abdominal mass by interventional radiology for tissue diagnosis. I will discuss with the patient further recommendation regarding treatment of his condition once final tissue diagnosis is confirmed. The patient agreed to the current plan. For pain management, continue the current pain medication with Dilaudid. Thank you so much for taking good care of Mr. [name], I will continue to follow up the patient with you and assist in his management on as-needed basis.   LOS: 1 day    Shantavia Jha K. 08/07/2014

## 2014-08-07 NOTE — Progress Notes (Signed)
69 YEAR OLD male with h/ometastatic non small cell lung cancer admitted for abdominal pain. He was found to have a porta hepatis mass and he was admitted earlier this am. Please see Dr Moise Boring note for detailed H&P.  GI consulted and plan for MRCP today.  Continue to monitor.   Hosie Poisson, MD [phone]

## 2014-08-07 NOTE — Consult Note (Signed)
Reason for Consult:Jaundice with a mass on CT scan ?pancreatic mass. Referring Physician: THP.  [name] is an 69 y.o. male.  HPI: 69 year old white male, with a history of non-small cell lung cancer of the RUL diagnosed in 2010, was doing fair;y well, under the care of Dr. Betsey Holiday with chemo and radiation] , till about 2 months ago when he started having RUQ pain, worse over the last 2 weeks. When he was seen in the office he was found to be jaundiced and labs revealed abnormal LFT's with an alkaline phosphatase close to 900 prompting admission to the hospital. CT on admission revealed a nodal mass in the porta hepatis.   Past Medical History  Diagnosis Date  . Hypertension   . Arthritis RIGHT SHOULDER  . Immature cataract BILATERAL  . Non-small cell lung cancer DX SEPT 2010  W/ CHEMORADIATION AT THAT TIME -- NOW  W/ METS--  CURRENTLY ON MAINTENANCE CHEMO TX  EVERY 30 DAYS    ONCOLOGIST- DR [facility]  . Acute meniscal tear of knee RIGHT KNEE  . BPH (benign prostatic hypertrophy)   . Dizziness and giddiness 04/23/2014  . Tremor 04/23/2014    Right hand  . HOH (hard of hearing)     bilateral hearing aids   Past Surgical History  Procedure Laterality Date  . 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 Rotator cuff repair  2008    LEFT SHOULDER  . Bilateral ear drum surgery  1960'S  . Orif left ring finger  and revascularization of radial side  02-16-2007    DEGLOVING INJURY  . Knee arthroscopy  12/21/2011    Procedure: ARTHROSCOPY KNEE;  Surgeon: Tobi Bastos, MD;  Location: [facility];  Service: Orthopedics;  Laterality: Right;  WITH MEDIAL MENISECTOMY  . Cataract extraction Bilateral   . Tonsillectomy      as child  . Ovarian cyst surgery      removed from left jaw area- benign  . Ovarian cyst surgery       NOTE; pt unaware of why this is in his chart  . Shoulder open rotator cuff  repair Right 06/05/2014    Procedure: RIGHT ROTATOR CUFF REPAIR SHOULDER OPEN;  Surgeon: Latanya Maudlin, MD;  Location: [facility];  Service: Orthopedics;  Laterality: Right;   Family History  Problem Relation Age of Onset  . Colon cancer    . Heart attack    . Cancer    . Hypertension    . Hyperlipidemia    . Congestive Heart Failure Mother   . Cancer Sister   . Cancer Maternal Grandfather   . Colon cancer Father   . Heart attack Brother     Social History:  reports that he quit smoking about 33 years ago. His smoking use included Cigarettes. He has a 40 pack-year smoking history. He has never used smokeless tobacco. He reports that he drinks about 3.5 oz of alcohol per week. He reports that he does not use illicit drugs.  Allergies:  Allergies  Allergen Reactions  . Augmentin [Amoxicillin-Pot Clavulanate] Diarrhea  . Carboplatin     Rash and itching after carbo test dose  . Levaquin [Levofloxacin In D5w]     Knee pain  . Percocet [Oxycodone-Acetaminophen] Nausea And Vomiting  . Zolpidem Tartrate Diarrhea   Medications: I have reviewed the patient's current medications.  Results for orders placed or performed during  the hospital encounter of 08/06/14 (from the past 48 hour(s))  Comprehensive metabolic panel     Status: Abnormal   Collection Time: 08/06/14  8:01 PM  Result Value Ref Range   Sodium 138 135 - 145 mmol/L   Potassium 3.6 3.5 - 5.1 mmol/L   Chloride 105 101 - 111 mmol/L   CO2 26 22 - 32 mmol/L   Glucose, Bld 117 (H) 70 - 99 mg/dL   BUN 22 (H) 6 - 20 mg/dL   Creatinine, Ser 1.40 (H) 0.61 - 1.24 mg/dL   Calcium 9.4 8.9 - 10.3 mg/dL   Total Protein 7.5 6.5 - 8.1 g/dL   Albumin 3.7 3.5 - 5.0 g/dL   AST 178 (H) 15 - 41 U/L   ALT 520 (H) 17 - 63 U/L   Alkaline Phosphatase 749 (H) 38 - 126 U/L   Total Bilirubin 5.0 (H) 0.3 - 1.2 mg/dL   GFR calc non Af Amer 50 (L) >60 mL/min   GFR calc Af Amer 58 (L) >60 mL/min    Comment: (NOTE) The eGFR has been calculated using  the CKD EPI equation. This calculation has not been validated in all clinical situations. eGFR's persistently <60 mL/min signify possible Chronic Kidney Disease.    Anion gap 7 5 - 15  CBC with Differential/Platelet     Status: Abnormal   Collection Time: 08/06/14  8:01 PM  Result Value Ref Range   WBC 6.1 4.0 - 10.5 K/uL   RBC 3.81 (L) 4.22 - 5.81 MIL/uL   Hemoglobin 11.6 (L) 13.0 - 17.0 g/dL   HCT 35.8 (L) 39.0 - 52.0 %   MCV 94.0 78.0 - 100.0 fL   MCH 30.4 26.0 - 34.0 pg   MCHC 32.4 30.0 - 36.0 g/dL   RDW 14.5 11.5 - 15.5 %   Platelets 163 150 - 400 K/uL   Neutrophils Relative % 60 43 - 77 %   Neutro Abs 3.7 1.7 - 7.7 K/uL   Lymphocytes Relative 22 12 - 46 %   Lymphs Abs 1.3 0.7 - 4.0 K/uL   Monocytes Relative 15 (H) 3 - 12 %   Monocytes Absolute 0.9 0.1 - 1.0 K/uL   Eosinophils Relative 2 0 - 5 %   Eosinophils Absolute 0.1 0.0 - 0.7 K/uL   Basophils Relative 1 0 - 1 %   Basophils Absolute 0.0 0.0 - 0.1 K/uL  Lipase, blood     Status: None   Collection Time: 08/06/14  8:01 PM  Result Value Ref Range   Lipase 49 22 - 51 U/L  Bilirubin, direct     Status: Abnormal   Collection Time: 08/06/14  8:01 PM  Result Value Ref Range   Bilirubin, Direct 3.5 (H) 0.1 - 0.5 mg/dL  Urinalysis, Routine w reflex microscopic     Status: Abnormal   Collection Time: 08/06/14  9:42 PM  Result Value Ref Range   Color, Urine AMBER (A) YELLOW    Comment: BIOCHEMICALS MAY BE AFFECTED BY COLOR   APPearance CLEAR CLEAR   Specific Gravity, Urine 1.013 1.005 - 1.030   pH 6.0 5.0 - 8.0   Glucose, UA NEGATIVE NEGATIVE mg/dL   Hgb urine dipstick NEGATIVE NEGATIVE   Bilirubin Urine MODERATE (A) NEGATIVE   Ketones, ur NEGATIVE NEGATIVE mg/dL   Protein, ur NEGATIVE NEGATIVE mg/dL   Urobilinogen, UA 1.0 0.0 - 1.0 mg/dL   Nitrite NEGATIVE NEGATIVE   Leukocytes, UA NEGATIVE NEGATIVE    Comment: MICROSCOPIC NOT DONE ON  URINES WITH NEGATIVE PROTEIN, BLOOD, LEUKOCYTES, NITRITE, OR GLUCOSE <1000 mg/dL.   Hepatic function panel     Status: Abnormal   Collection Time: 08/07/14  5:30 AM  Result Value Ref Range   Total Protein 6.6 6.5 - 8.1 g/dL   Albumin 3.2 (L) 3.5 - 5.0 g/dL   AST 153 (H) 15 - 41 U/L   ALT 425 (H) 17 - 63 U/L   Alkaline Phosphatase 667 (H) 38 - 126 U/L   Total Bilirubin 5.6 (H) 0.3 - 1.2 mg/dL   Bilirubin, Direct 3.8 (H) 0.1 - 0.5 mg/dL   Indirect Bilirubin 1.8 (H) 0.3 - 0.9 mg/dL  Basic metabolic panel     Status: Abnormal   Collection Time: 08/07/14  5:30 AM  Result Value Ref Range   Sodium 140 135 - 145 mmol/L   Potassium 3.7 3.5 - 5.1 mmol/L   Chloride 104 101 - 111 mmol/L   CO2 25 22 - 32 mmol/L   Glucose, Bld 115 (H) 70 - 99 mg/dL   BUN 17 6 - 20 mg/dL   Creatinine, Ser 1.25 (H) 0.61 - 1.24 mg/dL   Calcium 9.0 8.9 - 10.3 mg/dL   GFR calc non Af Amer 57 (L) >60 mL/min   GFR calc Af Amer >60 >60 mL/min    Comment: (NOTE) The eGFR has been calculated using the CKD EPI equation. This calculation has not been validated in all clinical situations. eGFR's persistently <60 mL/min signify possible Chronic Kidney Disease.    Anion gap 11 5 - 15  CBC with Differential/Platelet     Status: Abnormal   Collection Time: 08/07/14  5:30 AM  Result Value Ref Range   WBC 5.9 4.0 - 10.5 K/uL   RBC 3.32 (L) 4.22 - 5.81 MIL/uL   Hemoglobin 10.4 (L) 13.0 - 17.0 g/dL   HCT 31.0 (L) 39.0 - 52.0 %   MCV 93.4 78.0 - 100.0 fL   MCH 31.3 26.0 - 34.0 pg   MCHC 33.5 30.0 - 36.0 g/dL   RDW 14.5 11.5 - 15.5 %   Platelets 148 (L) 150 - 400 K/uL   Neutrophils Relative % 62 43 - 77 %   Neutro Abs 3.6 1.7 - 7.7 K/uL   Lymphocytes Relative 20 12 - 46 %   Lymphs Abs 1.2 0.7 - 4.0 K/uL   Monocytes Relative 16 (H) 3 - 12 %   Monocytes Absolute 0.9 0.1 - 1.0 K/uL   Eosinophils Relative 2 0 - 5 %   Eosinophils Absolute 0.1 0.0 - 0.7 K/uL   Basophils Relative 0 0 - 1 %   Basophils Absolute 0.0 0.0 - 0.1 K/uL  Protime-INR     Status: None   Collection Time: 08/07/14  5:30 AM  Result  Value Ref Range   Prothrombin Time 13.8 11.6 - 15.2 seconds   INR 1.04 0.00 - 1.49  Glucose, capillary     Status: Abnormal   Collection Time: 08/07/14 12:11 PM  Result Value Ref Range   Glucose-Capillary 147 (H) 70 - 99 mg/dL   Comment 1 Notify RN    Comment 2 Document in Chart   Glucose, capillary     Status: Abnormal   Collection Time: 08/07/14  6:10 PM  Result Value Ref Range   Glucose-Capillary 100 (H) 70 - 99 mg/dL   Ct Abdomen Pelvis Wo Contrast  08/06/2014   CLINICAL DATA:  Abdominal pain. Abdominal pain with onset of symptoms 1 week ago. History of non-small cell lung  carcinoma on maintenance chemotherapy.  EXAM: CT CHEST, ABDOMEN AND PELVIS WITHOUT CONTRAST  TECHNIQUE: Multidetector CT imaging of the chest, abdomen and pelvis was performed following the standard protocol without IV contrast.  COMPARISON:  CT 05/14/2014.  CT 02/05/2014.  FINDINGS: CT CHEST FINDINGS  Musculoskeletal: No thoracic spine compression fractures. No aggressive osseous lesions.  Lungs: Scarring is present extending to the RIGHT apex with dystrophic calcifications. LEFT pleural apical nodularity is chronic and compatible with scarring. Emphysema.  Central airways: Patent.  Vasculature: Atherosclerosis and coronary artery disease. RIGHT IJ Port-A-Cath. 42 mm ascending aorta.  Effusions: None.  Lymphadenopathy: There is no axillary adenopathy. The high RIGHT pretracheal nodal mass has enlarged and now measures 27 mm x 20 mm (image 19 series 2).  Esophagus: Grossly normal.  CT ABDOMEN AND PELVIS FINDINGS  Musculoskeletal: Lumbar vertebral body height is preserved. No aggressive osseous lesions identified.  Liver: Unenhanced CT was performed per clinician order. Lack of IV contrast limits sensitivity and specificity, especially for evaluation of abdominal/pelvic solid viscera.  There is a mass at the porta hepatis. It is unclear whether this arises from the porta hepatis nodes, the caudate lobe of the liver or the head of  the pancreas. Favor a nodal mass or from the head of the pancreas (image 64 series 2). This demonstrates central low attenuation consistent with central necrosis. The mass measures 5 cm x 5 cm on axial imaging and this is a new finding  Spleen:  Normal.  Gallbladder:  Contracted.  No calcified stones.  Common bile duct:  Obscured by a mass in the porta hepatis.  Pancreas: Body and tail the pancreas appear normal. The head and neck are difficult to evaluate because of mass and lack of IV contrast.  Adrenal glands:  Normal bilaterally.  Kidneys:  Within normal limits.  Stomach: Antrum and pylorus anteriorly displaced by the mass in the porta hepatis.  Small bowel:  No small bowel obstruction or inflammatory changes.  Colon:   Within normal limits.  Pelvic Genitourinary:  Normal bladder.  Peritoneum: No free fluid or free air.  Vascular/lymphatic: Tortuous aortoiliac system with dense atherosclerosis. No gross acute abnormality.  Body Wall: Tiny fat containing periumbilical hernia.  IMPRESSION: 1. Enlarging RIGHT paratracheal nodal mass now measuring 27 mm x 20 mm. This is compatible with malignant adenopathy in a patient with non-small cell lung carcinoma. 2. 42 mm ascending aorta. Recommend annual imaging followup by CTA or MRA. This recommendation follows 2010 ACCF/AHA/AATS/ACR/ASA/SCA/SCAI/SIR/STS/SVM Guidelines for the Diagnosis and Management of Patients with Thoracic Aortic Disease. Circulation. 2010; 121: J194-R740 3. New 5 cm x 5 cm abdominal mass centered in the porta hepatis, either arising from a node in the region or from the pancreatic head. This probably represents metastatic disease given the increased adenopathy in the chest.   Electronically Signed   By: Dereck Ligas M.D.   On: 08/06/2014 21:05   Ct Chest Wo Contrast  08/06/2014   CLINICAL DATA:  Abdominal pain. Abdominal pain with onset of symptoms 1 week ago. History of non-small cell lung carcinoma on maintenance chemotherapy.  EXAM: CT  CHEST, ABDOMEN AND PELVIS WITHOUT CONTRAST  TECHNIQUE: Multidetector CT imaging of the chest, abdomen and pelvis was performed following the standard protocol without IV contrast.  COMPARISON:  CT 05/14/2014.  CT 02/05/2014.  FINDINGS: CT CHEST FINDINGS  Musculoskeletal: No thoracic spine compression fractures. No aggressive osseous lesions.  Lungs: Scarring is present extending to the RIGHT apex with dystrophic calcifications. LEFT pleural apical nodularity  is chronic and compatible with scarring. Emphysema.  Central airways: Patent.  Vasculature: Atherosclerosis and coronary artery disease. RIGHT IJ Port-A-Cath. 42 mm ascending aorta.  Effusions: None.  Lymphadenopathy: There is no axillary adenopathy. The high RIGHT pretracheal nodal mass has enlarged and now measures 27 mm x 20 mm (image 19 series 2).  Esophagus: Grossly normal.  CT ABDOMEN AND PELVIS FINDINGS  Musculoskeletal: Lumbar vertebral body height is preserved. No aggressive osseous lesions identified.  Liver: Unenhanced CT was performed per clinician order. Lack of IV contrast limits sensitivity and specificity, especially for evaluation of abdominal/pelvic solid viscera.  There is a mass at the porta hepatis. It is unclear whether this arises from the porta hepatis nodes, the caudate lobe of the liver or the head of the pancreas. Favor a nodal mass or from the head of the pancreas (image 64 series 2). This demonstrates central low attenuation consistent with central necrosis. The mass measures 5 cm x 5 cm on axial imaging and this is a new finding  Spleen:  Normal.  Gallbladder:  Contracted.  No calcified stones.  Common bile duct:  Obscured by a mass in the porta hepatis.  Pancreas: Body and tail the pancreas appear normal. The head and neck are difficult to evaluate because of mass and lack of IV contrast.  Adrenal glands:  Normal bilaterally.  Kidneys:  Within normal limits.  Stomach: Antrum and pylorus anteriorly displaced by the mass in the porta  hepatis.  Small bowel:  No small bowel obstruction or inflammatory changes.  Colon:   Within normal limits.  Pelvic Genitourinary:  Normal bladder.  Peritoneum: No free fluid or free air.  Vascular/lymphatic: Tortuous aortoiliac system with dense atherosclerosis. No gross acute abnormality.  Body Wall: Tiny fat containing periumbilical hernia.  IMPRESSION: 1. Enlarging RIGHT paratracheal nodal mass now measuring 27 mm x 20 mm. This is compatible with malignant adenopathy in a patient with non-small cell lung carcinoma. 2. 42 mm ascending aorta. Recommend annual imaging followup by CTA or MRA. This recommendation follows 2010 ACCF/AHA/AATS/ACR/ASA/SCA/SCAI/SIR/STS/SVM Guidelines for the Diagnosis and Management of Patients with Thoracic Aortic Disease. Circulation. 2010; 121: [id number] 3. New 5 cm x 5 cm abdominal mass centered in the porta hepatis, either arising from a node in the region or from the pancreatic head. This probably represents metastatic disease given the increased adenopathy in the chest.   Electronically Signed   By: Dereck Ligas M.D.   On: 08/06/2014 21:05   Mr Abdomen Mrcp Wo Cm  08/07/2014   CLINICAL DATA:  Evaluate abdominal mass  EXAM: MRI ABDOMEN WITHOUT CONTRAST  (INCLUDING MRCP)  TECHNIQUE: Multiplanar multisequence MR imaging of the abdomen was performed. Heavily T2-weighted images of the biliary and pancreatic ducts were obtained, and three-dimensional MRCP images were rendered by post processing.  COMPARISON:  08/06/2014  FINDINGS: Lower chest:  No pleural or pericardial effusion noted.  Hepatobiliary: No focal liver abnormality identified. There is intrahepatic bile duct dilatation. There is fusiform dilatation of the common bile duct which measures up to 11 mm, image 24/series 10. The gallbladder appears normal. There is a large round mass within the right upper quadrant of the abdomen situated between the descending duodenum and head of pancreas. This measures 4.8 x 4.8 x 5.1 cm.  This has mass effect upon the distal common bile duct, portal vein at the venous confluence, IVC and descending duodenum.  Pancreas: There is no abnormal dilatation of the pancreatic duct. Body and tail of pancreas appear normal.  Spleen: Negative.  Adrenals/Urinary Tract: The adrenal glands are both normal. Normal appearance of the right kidney. Cyst in the left kidney measures 1.4 cm.  Stomach/Bowel: Unremarkable appearance of the stomach. Right upper quadrant mass exerts mass effect upon the descending duodenum. The visualize jejunum and colon are unremarkable.  Vascular/Lymphatic: Calcified atherosclerotic plaque involves the abdominal aorta. Multiple enlarged upper abdominal lymph nodes are identified. Index periaortic lymph node measures 1.3 cm, image 40/series 10.  Other: No free fluid or fluid collections identified within the upper abdomen.  Musculoskeletal: Normal signal from within the bone marrow.  IMPRESSION: 1. Large mass within the right upper quadrant of the abdomen is of on certain origin. This is situated between the head of pancreas and descending duodenum. This likely represents either a large nodal mass or lesion arising from the head of pancreas. The mass is partially obstructing the common bile duct and exerts mass effect upon the portal venous confluence, IVC, and descending duodenum. 2. Metastatic adenopathy noted within the left periaortic region.   Electronically Signed   By: Kerby Moors M.D.   On: 08/07/2014 09:58   Mr 3d Recon At Scanner  08/07/2014   CLINICAL DATA:  Evaluate abdominal mass  EXAM: MRI ABDOMEN WITHOUT CONTRAST  (INCLUDING MRCP)  TECHNIQUE: Multiplanar multisequence MR imaging of the abdomen was performed. Heavily T2-weighted images of the biliary and pancreatic ducts were obtained, and three-dimensional MRCP images were rendered by post processing.  COMPARISON:  08/06/2014  FINDINGS: Lower chest:  No pleural or pericardial effusion noted.  Hepatobiliary: No focal  liver abnormality identified. There is intrahepatic bile duct dilatation. There is fusiform dilatation of the common bile duct which measures up to 11 mm, image 24/series 10. The gallbladder appears normal. There is a large round mass within the right upper quadrant of the abdomen situated between the descending duodenum and head of pancreas. This measures 4.8 x 4.8 x 5.1 cm. This has mass effect upon the distal common bile duct, portal vein at the venous confluence, IVC and descending duodenum.  Pancreas: There is no abnormal dilatation of the pancreatic duct. Body and tail of pancreas appear normal.  Spleen: Negative.  Adrenals/Urinary Tract: The adrenal glands are both normal. Normal appearance of the right kidney. Cyst in the left kidney measures 1.4 cm.  Stomach/Bowel: Unremarkable appearance of the stomach. Right upper quadrant mass exerts mass effect upon the descending duodenum. The visualize jejunum and colon are unremarkable.  Vascular/Lymphatic: Calcified atherosclerotic plaque involves the abdominal aorta. Multiple enlarged upper abdominal lymph nodes are identified. Index periaortic lymph node measures 1.3 cm, image 40/series 10.  Other: No free fluid or fluid collections identified within the upper abdomen.  Musculoskeletal: Normal signal from within the bone marrow.  IMPRESSION: 1. Large mass within the right upper quadrant of the abdomen is of on certain origin. This is situated between the head of pancreas and descending duodenum. This likely represents either a large nodal mass or lesion arising from the head of pancreas. The mass is partially obstructing the common bile duct and exerts mass effect upon the portal venous confluence, IVC, and descending duodenum. 2. Metastatic adenopathy noted within the left periaortic region.   Electronically Signed   By: Kerby Moors M.D.   On: 08/07/2014 09:58   Review of Systems  Constitutional: Positive for weight loss. Negative for fever, chills,  malaise/fatigue and diaphoresis.  HENT: Negative.   Eyes: Negative.   Respiratory: Negative.   Cardiovascular: Negative.   Gastrointestinal: Positive for nausea  and abdominal pain. Negative for heartburn, vomiting, diarrhea, constipation, blood in stool and melena.  Musculoskeletal: Positive for joint pain.  Skin: Negative.   Neurological: Positive for weakness.  Psychiatric/Behavioral: Negative.    Blood pressure 140/72, pulse 70, temperature 98.1 F (36.7 C), temperature source Oral, resp. rate 18, height 5' 8"  (1.727 m), weight 60.4 kg (133 lb 2.5 oz), SpO2 100 %. Physical Exam  Constitutional: He is oriented to person, place, and time. He appears ill.  HENT:  Head: Normocephalic and atraumatic.  Eyes: Lids are normal.  Scleral icterus  Neck: Trachea normal and normal range of motion. Neck supple.  Cardiovascular: Normal rate and regular rhythm.   Respiratory: Effort normal and breath sounds normal.  GI: Soft. Normal appearance. There is tenderness in the right upper quadrant and periumbilical area.  Neurological: He is alert and oriented to person, place, and time.   Assessment/Plan: 1?Metastatic lung cancer with mass in the porta hepatis causing obstructive jaundice: ERCP planned for 08/09/14.  2) Hypertension.  3) Malnutrition.Marland Kitchen  [name] 08/07/2014, 6:45 PM

## 2014-08-07 NOTE — H&P (Addendum)
[facility] History and Physical  Tage Feggins LPF:[id number] DOB: 14-Dec-1945 DOA: 08/06/2014  Referring physician: Dr. Reather Converse. PCP: [name], MD  Specialists: Dr. [name].  Chief Complaint: Abdominal pain.  HPI: [name] is a 69 y.o. male with known history of metastatic non-small cell lung cancer presently under observation has been experiencing abdominal pain over the last 1 week. Patient also has been having poor appetite with at least 10 pounds weight loss over the last 1 month. Patient's pain is mostly epigastric in area with nausea denies any vomiting or diarrhea. Patient was referred to gastroenterologist Dr. Collene Mares yesterday who referred patient to ER after patient's LFTs were found to be elevated. CT abdomen and pelvis done in the ER shows abdominal mass and patient has been admitted for further management. Patient had required IV Dilaudid for pain relief medications. Patient denies any chest pain or shortness of breath.   Review of Systems: As presented in the history of presenting illness, rest negative.  Past Medical History  Diagnosis Date  . Hypertension   . Arthritis RIGHT SHOULDER  . Immature cataract BILATERAL  . Non-small cell lung cancer DX SEPT 2010  W/ CHEMORADIATION AT THAT TIME -- NOW  W/ METS--  CURRENTLY ON MAINTENANCE CHEMO TX  EVERY 30 DAYS    ONCOLOGIST- DR [facility]  . Acute meniscal tear of knee RIGHT KNEE  . BPH (benign prostatic hypertrophy)   . Dizziness and giddiness 04/23/2014  . Tremor 04/23/2014    Right hand  . HOH (hard of hearing)     bilateral hearing aids   Past Surgical History  Procedure Laterality Date  . 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 Rotator cuff repair  2008    LEFT SHOULDER  . Bilateral ear drum surgery  1960'S  . Orif left ring finger  and revascularization of radial side  02-16-2007    DEGLOVING INJURY  . Knee arthroscopy   12/21/2011    Procedure: ARTHROSCOPY KNEE;  Surgeon: Tobi Bastos, MD;  Location: [facility];  Service: Orthopedics;  Laterality: Right;  WITH MEDIAL MENISECTOMY  . Cataract extraction Bilateral   . Tonsillectomy      as child  . Ovarian cyst surgery      removed from left jaw area- benign  . Ovarian cyst surgery       NOTE; pt unaware of why this is in his chart  . Shoulder open rotator cuff repair Right 06/05/2014    Procedure: RIGHT ROTATOR CUFF REPAIR SHOULDER OPEN;  Surgeon: Latanya Maudlin, MD;  Location: [facility];  Service: Orthopedics;  Laterality: Right;   Social History:  reports that he quit smoking about 33 years ago. His smoking use included Cigarettes. He has a 40 pack-year smoking history. He has never used smokeless tobacco. He reports that he drinks about 3.5 oz of alcohol per week. He reports that he does not use illicit drugs. Where does patient live home. Can patient participate in ADLs? Yes.  Allergies  Allergen Reactions  . Augmentin [Amoxicillin-Pot Clavulanate] Diarrhea  . Carboplatin     Rash and itching after carbo test dose  . Levaquin [Levofloxacin In D5w]     Knee pain  . Percocet [Oxycodone-Acetaminophen] Nausea And Vomiting  . Zolpidem Tartrate Diarrhea    Family History:  Family History  Problem Relation Age of Onset  . Colon cancer    . Heart attack    .  Cancer    . Hypertension    . Hyperlipidemia    . Congestive Heart Failure Mother   . Cancer Sister   . Cancer Maternal Grandfather   . Colon cancer Father   . Heart attack Brother       Prior to Admission medications   Medication Sig Start Date End Date Taking? Authorizing Provider  amLODipine (NORVASC) 2.5 MG tablet Take 2.5 mg by mouth every morning.  01/13/11  Yes Historical Provider, MD  ampicillin (PRINCIPEN) 250 MG capsule Take 250 mg by mouth 4 (four) times daily.  07/30/14  Yes Historical Provider, MD  Ascorbic Acid (VITAMIN C) 1000 MG tablet Take 1,000 mg by mouth  daily.   Yes Historical Provider, MD  atenolol (TENORMIN) 100 MG tablet Take 100 mg by mouth every morning.    Yes Historical Provider, MD  B Complex-Biotin-FA (B-COMPLEX PO) Take 1 tablet by mouth daily. '1500mg'$  daily   Yes Historical Provider, MD  FeFum-FePoly-FA-B Cmp-C-Biot (FOLIVANE-PLUS) CAPS Take 1 capsule by mouth daily. 11/13/13  Yes [name], MD  HYDROmorphone (DILAUDID) 2 MG tablet Take 1 tablet (2 mg total) by mouth every 3 (three) hours as needed for severe pain (Q4-6 hours PRN). 06/06/14  Yes Amber Cecilio Asper, PA-C  methocarbamol (ROBAXIN) 500 MG tablet Take 1 tablet (500 mg total) by mouth every 6 (six) hours as needed for muscle spasms. 06/06/14  Yes Amber Cecilio Asper, PA-C  Multiple Vitamin (MULTIVITAMIN) tablet Take 1 tablet by mouth daily.    Yes Historical Provider, MD  MYRBETRIQ 50 MG TB24 tablet Take 50 mg by mouth daily. 01/09/14  Yes Historical Provider, MD  omeprazole (PRILOSEC) 40 MG capsule Take 1 capsule (40 mg total) by mouth every evening. 11/13/13  Yes [name], MD  Sennosides-Docusate Sodium (SENOKOT S PO) Take 1 tablet by mouth daily. 2 tabs daily   Yes Historical Provider, MD    Physical Exam: Filed Vitals:   08/06/14 1853 08/06/14 2247  BP: 174/88 105/72  Pulse: 62 62  Temp: 97.9 F (36.6 C) 98 F (36.7 C)  TempSrc: Oral Oral  Resp: 18 18  Height:  '5\' 8"'$  (1.727 m)  Weight:  60.4 kg (133 lb 2.5 oz)  SpO2: 99% 100%     General:  Moderately built and nourished.  Eyes: Anicteric no pallor.  ENT: No discharge from the ears eyes nose or mouth.  Neck: No mass felt.  Cardiovascular: S1 and S2 heard.  Respiratory: No rhonchi or crepitations.  Abdomen: Soft mild epigastric tenderness no guarding or rigidity.  Skin: No rash.  Musculoskeletal: No edema.  Psychiatric: Appears normal.  Neurologic: Alert awake oriented to time place and person. Moves all extremities.  Labs on Admission:  Basic Metabolic Panel:  Recent Labs Lab  08/06/14 2001  NA 138  K 3.6  CL 105  CO2 26  GLUCOSE 117*  BUN 22*  CREATININE 1.40*  CALCIUM 9.4   Liver Function Tests:  Recent Labs Lab 08/06/14 2001  AST 178*  ALT 520*  ALKPHOS 749*  BILITOT 5.0*  PROT 7.5  ALBUMIN 3.7    Recent Labs Lab 08/06/14 2001  LIPASE 49   No results for input(s): AMMONIA in the last 168 hours. CBC:  Recent Labs Lab 08/06/14 2001  WBC 6.1  NEUTROABS 3.7  HGB 11.6*  HCT 35.8*  MCV 94.0  PLT 163   Cardiac Enzymes: No results for input(s): CKTOTAL, CKMB, CKMBINDEX, TROPONINI in the last 168 hours.  BNP (last 3 results) No results for  input(s): BNP in the last 8760 hours.  ProBNP (last 3 results) No results for input(s): PROBNP in the last 8760 hours.  CBG: No results for input(s): GLUCAP in the last 168 hours.  Radiological Exams on Admission: Ct Abdomen Pelvis Wo Contrast  08/06/2014   CLINICAL DATA:  Abdominal pain. Abdominal pain with onset of symptoms 1 week ago. History of non-small cell lung carcinoma on maintenance chemotherapy.  EXAM: CT CHEST, ABDOMEN AND PELVIS WITHOUT CONTRAST  TECHNIQUE: Multidetector CT imaging of the chest, abdomen and pelvis was performed following the standard protocol without IV contrast.  COMPARISON:  CT 05/14/2014.  CT 02/05/2014.  FINDINGS: CT CHEST FINDINGS  Musculoskeletal: No thoracic spine compression fractures. No aggressive osseous lesions.  Lungs: Scarring is present extending to the RIGHT apex with dystrophic calcifications. LEFT pleural apical nodularity is chronic and compatible with scarring. Emphysema.  Central airways: Patent.  Vasculature: Atherosclerosis and coronary artery disease. RIGHT IJ Port-A-Cath. 42 mm ascending aorta.  Effusions: None.  Lymphadenopathy: There is no axillary adenopathy. The high RIGHT pretracheal nodal mass has enlarged and now measures 27 mm x 20 mm (image 19 series 2).  Esophagus: Grossly normal.  CT ABDOMEN AND PELVIS FINDINGS  Musculoskeletal: Lumbar  vertebral body height is preserved. No aggressive osseous lesions identified.  Liver: Unenhanced CT was performed per clinician order. Lack of IV contrast limits sensitivity and specificity, especially for evaluation of abdominal/pelvic solid viscera.  There is a mass at the porta hepatis. It is unclear whether this arises from the porta hepatis nodes, the caudate lobe of the liver or the head of the pancreas. Favor a nodal mass or from the head of the pancreas (image 64 series 2). This demonstrates central low attenuation consistent with central necrosis. The mass measures 5 cm x 5 cm on axial imaging and this is a new finding  Spleen:  Normal.  Gallbladder:  Contracted.  No calcified stones.  Common bile duct:  Obscured by a mass in the porta hepatis.  Pancreas: Body and tail the pancreas appear normal. The head and neck are difficult to evaluate because of mass and lack of IV contrast.  Adrenal glands:  Normal bilaterally.  Kidneys:  Within normal limits.  Stomach: Antrum and pylorus anteriorly displaced by the mass in the porta hepatis.  Small bowel:  No small bowel obstruction or inflammatory changes.  Colon:   Within normal limits.  Pelvic Genitourinary:  Normal bladder.  Peritoneum: No free fluid or free air.  Vascular/lymphatic: Tortuous aortoiliac system with dense atherosclerosis. No gross acute abnormality.  Body Wall: Tiny fat containing periumbilical hernia.  IMPRESSION: 1. Enlarging RIGHT paratracheal nodal mass now measuring 27 mm x 20 mm. This is compatible with malignant adenopathy in a patient with non-small cell lung carcinoma. 2. 42 mm ascending aorta. Recommend annual imaging followup by CTA or MRA. This recommendation follows 2010 ACCF/AHA/AATS/ACR/ASA/SCA/SCAI/SIR/STS/SVM Guidelines for the Diagnosis and Management of Patients with Thoracic Aortic Disease. Circulation. 2010; 121: P295-J884 3. New 5 cm x 5 cm abdominal mass centered in the porta hepatis, either arising from a node in the region  or from the pancreatic head. This probably represents metastatic disease given the increased adenopathy in the chest.   Electronically Signed   By: Dereck Ligas M.D.   On: 08/06/2014 21:05   Ct Chest Wo Contrast  08/06/2014   CLINICAL DATA:  Abdominal pain. Abdominal pain with onset of symptoms 1 week ago. History of non-small cell lung carcinoma on maintenance chemotherapy.  EXAM: CT  CHEST, ABDOMEN AND PELVIS WITHOUT CONTRAST  TECHNIQUE: Multidetector CT imaging of the chest, abdomen and pelvis was performed following the standard protocol without IV contrast.  COMPARISON:  CT 05/14/2014.  CT 02/05/2014.  FINDINGS: CT CHEST FINDINGS  Musculoskeletal: No thoracic spine compression fractures. No aggressive osseous lesions.  Lungs: Scarring is present extending to the RIGHT apex with dystrophic calcifications. LEFT pleural apical nodularity is chronic and compatible with scarring. Emphysema.  Central airways: Patent.  Vasculature: Atherosclerosis and coronary artery disease. RIGHT IJ Port-A-Cath. 42 mm ascending aorta.  Effusions: None.  Lymphadenopathy: There is no axillary adenopathy. The high RIGHT pretracheal nodal mass has enlarged and now measures 27 mm x 20 mm (image 19 series 2).  Esophagus: Grossly normal.  CT ABDOMEN AND PELVIS FINDINGS  Musculoskeletal: Lumbar vertebral body height is preserved. No aggressive osseous lesions identified.  Liver: Unenhanced CT was performed per clinician order. Lack of IV contrast limits sensitivity and specificity, especially for evaluation of abdominal/pelvic solid viscera.  There is a mass at the porta hepatis. It is unclear whether this arises from the porta hepatis nodes, the caudate lobe of the liver or the head of the pancreas. Favor a nodal mass or from the head of the pancreas (image 64 series 2). This demonstrates central low attenuation consistent with central necrosis. The mass measures 5 cm x 5 cm on axial imaging and this is a new finding  Spleen:  Normal.   Gallbladder:  Contracted.  No calcified stones.  Common bile duct:  Obscured by a mass in the porta hepatis.  Pancreas: Body and tail the pancreas appear normal. The head and neck are difficult to evaluate because of mass and lack of IV contrast.  Adrenal glands:  Normal bilaterally.  Kidneys:  Within normal limits.  Stomach: Antrum and pylorus anteriorly displaced by the mass in the porta hepatis.  Small bowel:  No small bowel obstruction or inflammatory changes.  Colon:   Within normal limits.  Pelvic Genitourinary:  Normal bladder.  Peritoneum: No free fluid or free air.  Vascular/lymphatic: Tortuous aortoiliac system with dense atherosclerosis. No gross acute abnormality.  Body Wall: Tiny fat containing periumbilical hernia.  IMPRESSION: 1. Enlarging RIGHT paratracheal nodal mass now measuring 27 mm x 20 mm. This is compatible with malignant adenopathy in a patient with non-small cell lung carcinoma. 2. 42 mm ascending aorta. Recommend annual imaging followup by CTA or MRA. This recommendation follows 2010 ACCF/AHA/AATS/ACR/ASA/SCA/SCAI/SIR/STS/SVM Guidelines for the Diagnosis and Management of Patients with Thoracic Aortic Disease. Circulation. 2010; 121: [id number] 3. New 5 cm x 5 cm abdominal mass centered in the porta hepatis, either arising from a node in the region or from the pancreatic head. This probably represents metastatic disease given the increased adenopathy in the chest.   Electronically Signed   By: Dereck Ligas M.D.   On: 08/06/2014 21:05     Assessment/Plan Principal Problem:   Obstructive jaundice due to cancer Active Problems:   Bronchogenic cancer of left lung   Hypertension   Abdominal pain, generalized   CRF (chronic renal failure)   Obstructive jaundice   1. Obstructive jaundice with abdominal mass in a patient with known history of non-small cell lung cancer - at this time I have placed patient nothing by mouth in anticipation of possible GI procedure. Follow LFTs. I  have continued patient on gentle hydration and pain medications. Gastroenterologist Dr. Lorie Apley office is aware of patient's admission. 2. Hypertension - continue home medications. Patient is on metoprolol and Norvasc.  3. Chronic kidney disease stage IV - creatinine appears to be at baseline. Continue to monitor. 4. Recent oral infection and patient is on ampicillin has 2 more doses left.  Addendum - discussed with Dr. Benson Norway, on-call gastroenterologist who at this time advised to order MRCP. And they will be seeing patient in consult. GI is not planning to do any procedures so patient can eat.   DVT Prophylaxis SCDs in anticipation of procedure.  Code Status: Full code.  Family Communication: Patient's wife at the bedside.  Disposition Plan: Admit to inpatient. Likely stay 3-4 days.    Hailie Searight N. [facility] Pager [phone].  If 7PM-7AM, please contact night-coverage [email or web address] Password [facility] 08/07/2014, 12:43 AM

## 2014-08-07 NOTE — Telephone Encounter (Signed)
Patient family member states that patient is currently admitted into hospital and received a CT of chest  without contrast. Patient family member wants to cancel CT of chest scheduled for 08/14/14.

## 2014-08-07 NOTE — Telephone Encounter (Signed)
Left message to confirm lab/ct scan 05/18

## 2014-08-08 LAB — GLUCOSE, CAPILLARY
GLUCOSE-CAPILLARY: 112 mg/dL — AB (ref 65–99)
GLUCOSE-CAPILLARY: 152 mg/dL — AB (ref 65–99)
GLUCOSE-CAPILLARY: 94 mg/dL (ref 65–99)
Glucose-Capillary: 115 mg/dL — ABNORMAL HIGH (ref 65–99)

## 2014-08-08 MED ORDER — POLYETHYLENE GLYCOL 3350 17 G PO PACK
17.0000 g | PACK | Freq: Two times a day (BID) | ORAL | Status: DC
  Administered 2014-08-08 – 2014-08-12 (×3): 17 g via ORAL
  Filled 2014-08-08 (×17): qty 1

## 2014-08-08 MED ORDER — LACTULOSE 10 GM/15ML PO SOLN: 30.0000 g | Freq: Every day | ORAL | Status: DC | PRN

## 2014-08-08 NOTE — Progress Notes (Addendum)
PATIENT DETAILS Name: [name] Age: 69 y.o. Sex: male Date of Birth: 1945/09/01 Admit Date: 08/06/2014 Admitting Physician Rise Patience, MD [id number], Annie Main, MD  Subjective: Itching, still with scleral icterus. No other complaints.  Assessment/Plan: Principal Problem:   Obstructive jaundice: Secondary to a mass around the porta hepatis-given history of non-small cell lung cancer-suspicion for metastatic disease. Seen by GI, plans are for ERCP with stenting on 5/13. In interim continue with supportive care.   Active Problems:    History of non-small cell lung cancer: Previously was on several chemotherapy regimens-however for the past 15 months on just observation. Oncology consulted. See above.    Hypertension: Controlled, continue with atenolol and Norvasc    Constipation: Add MiraLAX. Suspect from narcotic use.      Abdominal pain, generalized: Chronic issue-suspect secondary to malignancy-continue with as needed narcotics.     Chronic kidney disease stage III : Creatinine close to her usual baseline. Monitor periodically     Malnutrition of moderate degree: Continue with supplements   Disposition: Remain inpatient-home likely on 5/14  Antimicrobial agents  See below  Anti-infectives    Start     Dose/Rate Route Frequency Ordered Stop   08/07/14 1000  ampicillin (PRINCIPEN) capsule 250 mg  Status:  Discontinued     250 mg Oral 4 times daily 08/07/14 0042 08/07/14 1551      DVT Prophylaxis: SCD's-resume pharmacological agents post ERCP   Code Status: Full code   Family Communication Spouse at bedside  Procedures: None  CONSULTS:  GI and hematology/oncology  Time spent 20 minutes-Greater than 50% of this time was spent in counseling, explanation of diagnosis, planning of further management, and coordination of care.  MEDICATIONS: Scheduled Meds: . amLODipine  2.5 mg Oral q morning - 10a  . atenolol  100 mg Oral q  morning - 10a  . mirabegron ER  50 mg Oral Daily  . multivitamin with minerals   Oral Daily  . pantoprazole  40 mg Oral Daily  . polyethylene glycol  17 g Oral BID   Continuous Infusions:  PRN Meds:.HYDROmorphone (DILAUDID) injection, lactulose, methocarbamol, ondansetron **OR** ondansetron (ZOFRAN) IV    PHYSICAL EXAM: Vital signs in last 24 hours: Filed Vitals:   08/07/14 1430 08/07/14 2125 08/08/14 0508 08/08/14 1021  BP: 140/72 129/71 136/79 148/79  Pulse: 70 73 71 70  Temp: 98.1 F (36.7 C) 98.2 F (36.8 C) 98.4 F (36.9 C)   TempSrc: Oral Oral Oral   Resp: '18 18 18   '$ Height:      Weight:      SpO2: 100% 98% 96%     Weight change:  Filed Weights   08/06/14 2247  Weight: 60.4 kg (133 lb 2.5 oz)   Body mass index is 20.25 kg/(m^2).   Gen Exam: Awake and alert with clear speech.  Scleral icterus Neck: Supple, No JVD.   Chest: B/L Clear.   CVS: S1 S2 Regular, no murmurs.  Abdomen: soft, BS +, non tender, non distended.  Extremities: no edema, lower extremities warm to touch. Neurologic: Non Focal.   Skin: No Rash.   Wounds: N/A.    Intake/Output from previous day:  Intake/Output Summary (Last 24 hours) at 08/08/14 1335 Last data filed at 08/08/14 0300  Gross per 24 hour  Intake    240 ml  Output   2100 ml  Net  -1860 ml     LAB  RESULTS: CBC  Recent Labs Lab 08/06/14 2001 08/07/14 0530  WBC 6.1 5.9  HGB 11.6* 10.4*  HCT 35.8* 31.0*  PLT 163 148*  MCV 94.0 93.4  MCH 30.4 31.3  MCHC 32.4 33.5  RDW 14.5 14.5  LYMPHSABS 1.3 1.2  MONOABS 0.9 0.9  EOSABS 0.1 0.1  BASOSABS 0.0 0.0    Chemistries   Recent Labs Lab 08/06/14 2001 08/07/14 0530  NA 138 140  K 3.6 3.7  CL 105 104  CO2 26 25  GLUCOSE 117* 115*  BUN 22* 17  CREATININE 1.40* 1.25*  CALCIUM 9.4 9.0    CBG:  Recent Labs Lab 08/07/14 1211 08/07/14 1810 08/08/14 0011 08/08/14 0623 08/08/14 1208  GLUCAP 147* 100* 115* 112* 152*    GFR Estimated Creatinine  Clearance: 47.6 mL/min (by C-G formula based on Cr of 1.25).  Coagulation profile  Recent Labs Lab 08/07/14 0530  INR 1.04    Cardiac Enzymes No results for input(s): CKMB, TROPONINI, MYOGLOBIN in the last 168 hours.  Invalid input(s): CK  Invalid input(s): POCBNP No results for input(s): DDIMER in the last 72 hours. No results for input(s): HGBA1C in the last 72 hours. No results for input(s): CHOL, HDL, LDLCALC, TRIG, CHOLHDL, LDLDIRECT in the last 72 hours. No results for input(s): TSH, T4TOTAL, T3FREE, THYROIDAB in the last 72 hours.  Invalid input(s): FREET3 No results for input(s): VITAMINB12, FOLATE, FERRITIN, TIBC, IRON, RETICCTPCT in the last 72 hours.  Recent Labs  08/06/14 2001  LIPASE 49    Urine Studies No results for input(s): UHGB, CRYS in the last 72 hours.  Invalid input(s): UACOL, UAPR, USPG, UPH, UTP, UGL, UKET, UBIL, UNIT, UROB, ULEU, UEPI, UWBC, URBC, UBAC, CAST, UCOM, BILUA  MICROBIOLOGY: No results found for this or any previous visit (from the past 240 hour(s)).  RADIOLOGY STUDIES/RESULTS: Ct Abdomen Pelvis Wo Contrast  08/06/2014   CLINICAL DATA:  Abdominal pain. Abdominal pain with onset of symptoms 1 week ago. History of non-small cell lung carcinoma on maintenance chemotherapy.  EXAM: CT CHEST, ABDOMEN AND PELVIS WITHOUT CONTRAST  TECHNIQUE: Multidetector CT imaging of the chest, abdomen and pelvis was performed following the standard protocol without IV contrast.  COMPARISON:  CT 05/14/2014.  CT 02/05/2014.  FINDINGS: CT CHEST FINDINGS  Musculoskeletal: No thoracic spine compression fractures. No aggressive osseous lesions.  Lungs: Scarring is present extending to the RIGHT apex with dystrophic calcifications. LEFT pleural apical nodularity is chronic and compatible with scarring. Emphysema.  Central airways: Patent.  Vasculature: Atherosclerosis and coronary artery disease. RIGHT IJ Port-A-Cath. 42 mm ascending aorta.  Effusions: None.   Lymphadenopathy: There is no axillary adenopathy. The high RIGHT pretracheal nodal mass has enlarged and now measures 27 mm x 20 mm (image 19 series 2).  Esophagus: Grossly normal.  CT ABDOMEN AND PELVIS FINDINGS  Musculoskeletal: Lumbar vertebral body height is preserved. No aggressive osseous lesions identified.  Liver: Unenhanced CT was performed per clinician order. Lack of IV contrast limits sensitivity and specificity, especially for evaluation of abdominal/pelvic solid viscera.  There is a mass at the porta hepatis. It is unclear whether this arises from the porta hepatis nodes, the caudate lobe of the liver or the head of the pancreas. Favor a nodal mass or from the head of the pancreas (image 64 series 2). This demonstrates central low attenuation consistent with central necrosis. The mass measures 5 cm x 5 cm on axial imaging and this is a new finding  Spleen:  Normal.  Gallbladder:  Contracted.  No calcified stones.  Common bile duct:  Obscured by a mass in the porta hepatis.  Pancreas: Body and tail the pancreas appear normal. The head and neck are difficult to evaluate because of mass and lack of IV contrast.  Adrenal glands:  Normal bilaterally.  Kidneys:  Within normal limits.  Stomach: Antrum and pylorus anteriorly displaced by the mass in the porta hepatis.  Small bowel:  No small bowel obstruction or inflammatory changes.  Colon:   Within normal limits.  Pelvic Genitourinary:  Normal bladder.  Peritoneum: No free fluid or free air.  Vascular/lymphatic: Tortuous aortoiliac system with dense atherosclerosis. No gross acute abnormality.  Body Wall: Tiny fat containing periumbilical hernia.  IMPRESSION: 1. Enlarging RIGHT paratracheal nodal mass now measuring 27 mm x 20 mm. This is compatible with malignant adenopathy in a patient with non-small cell lung carcinoma. 2. 42 mm ascending aorta. Recommend annual imaging followup by CTA or MRA. This recommendation follows 2010  ACCF/AHA/AATS/ACR/ASA/SCA/SCAI/SIR/STS/SVM Guidelines for the Diagnosis and Management of Patients with Thoracic Aortic Disease. Circulation. 2010; 121: B638-L373 3. New 5 cm x 5 cm abdominal mass centered in the porta hepatis, either arising from a node in the region or from the pancreatic head. This probably represents metastatic disease given the increased adenopathy in the chest.   Electronically Signed   By: Dereck Ligas M.D.   On: 08/06/2014 21:05   Ct Chest Wo Contrast  08/06/2014   CLINICAL DATA:  Abdominal pain. Abdominal pain with onset of symptoms 1 week ago. History of non-small cell lung carcinoma on maintenance chemotherapy.  EXAM: CT CHEST, ABDOMEN AND PELVIS WITHOUT CONTRAST  TECHNIQUE: Multidetector CT imaging of the chest, abdomen and pelvis was performed following the standard protocol without IV contrast.  COMPARISON:  CT 05/14/2014.  CT 02/05/2014.  FINDINGS: CT CHEST FINDINGS  Musculoskeletal: No thoracic spine compression fractures. No aggressive osseous lesions.  Lungs: Scarring is present extending to the RIGHT apex with dystrophic calcifications. LEFT pleural apical nodularity is chronic and compatible with scarring. Emphysema.  Central airways: Patent.  Vasculature: Atherosclerosis and coronary artery disease. RIGHT IJ Port-A-Cath. 42 mm ascending aorta.  Effusions: None.  Lymphadenopathy: There is no axillary adenopathy. The high RIGHT pretracheal nodal mass has enlarged and now measures 27 mm x 20 mm (image 19 series 2).  Esophagus: Grossly normal.  CT ABDOMEN AND PELVIS FINDINGS  Musculoskeletal: Lumbar vertebral body height is preserved. No aggressive osseous lesions identified.  Liver: Unenhanced CT was performed per clinician order. Lack of IV contrast limits sensitivity and specificity, especially for evaluation of abdominal/pelvic solid viscera.  There is a mass at the porta hepatis. It is unclear whether this arises from the porta hepatis nodes, the caudate lobe of the liver  or the head of the pancreas. Favor a nodal mass or from the head of the pancreas (image 64 series 2). This demonstrates central low attenuation consistent with central necrosis. The mass measures 5 cm x 5 cm on axial imaging and this is a new finding  Spleen:  Normal.  Gallbladder:  Contracted.  No calcified stones.  Common bile duct:  Obscured by a mass in the porta hepatis.  Pancreas: Body and tail the pancreas appear normal. The head and neck are difficult to evaluate because of mass and lack of IV contrast.  Adrenal glands:  Normal bilaterally.  Kidneys:  Within normal limits.  Stomach: Antrum and pylorus anteriorly displaced by the mass in the porta hepatis.  Small bowel:  No  small bowel obstruction or inflammatory changes.  Colon:   Within normal limits.  Pelvic Genitourinary:  Normal bladder.  Peritoneum: No free fluid or free air.  Vascular/lymphatic: Tortuous aortoiliac system with dense atherosclerosis. No gross acute abnormality.  Body Wall: Tiny fat containing periumbilical hernia.  IMPRESSION: 1. Enlarging RIGHT paratracheal nodal mass now measuring 27 mm x 20 mm. This is compatible with malignant adenopathy in a patient with non-small cell lung carcinoma. 2. 42 mm ascending aorta. Recommend annual imaging followup by CTA or MRA. This recommendation follows 2010 ACCF/AHA/AATS/ACR/ASA/SCA/SCAI/SIR/STS/SVM Guidelines for the Diagnosis and Management of Patients with Thoracic Aortic Disease. Circulation. 2010; 121: R443-X540 3. New 5 cm x 5 cm abdominal mass centered in the porta hepatis, either arising from a node in the region or from the pancreatic head. This probably represents metastatic disease given the increased adenopathy in the chest.   Electronically Signed   By: Dereck Ligas M.D.   On: 08/06/2014 21:05   Mr Abdomen Mrcp Wo Cm  08/07/2014   CLINICAL DATA:  Evaluate abdominal mass  EXAM: MRI ABDOMEN WITHOUT CONTRAST  (INCLUDING MRCP)  TECHNIQUE: Multiplanar multisequence MR imaging of the  abdomen was performed. Heavily T2-weighted images of the biliary and pancreatic ducts were obtained, and three-dimensional MRCP images were rendered by post processing.  COMPARISON:  08/06/2014  FINDINGS: Lower chest:  No pleural or pericardial effusion noted.  Hepatobiliary: No focal liver abnormality identified. There is intrahepatic bile duct dilatation. There is fusiform dilatation of the common bile duct which measures up to 11 mm, image 24/series 10. The gallbladder appears normal. There is a large round mass within the right upper quadrant of the abdomen situated between the descending duodenum and head of pancreas. This measures 4.8 x 4.8 x 5.1 cm. This has mass effect upon the distal common bile duct, portal vein at the venous confluence, IVC and descending duodenum.  Pancreas: There is no abnormal dilatation of the pancreatic duct. Body and tail of pancreas appear normal.  Spleen: Negative.  Adrenals/Urinary Tract: The adrenal glands are both normal. Normal appearance of the right kidney. Cyst in the left kidney measures 1.4 cm.  Stomach/Bowel: Unremarkable appearance of the stomach. Right upper quadrant mass exerts mass effect upon the descending duodenum. The visualize jejunum and colon are unremarkable.  Vascular/Lymphatic: Calcified atherosclerotic plaque involves the abdominal aorta. Multiple enlarged upper abdominal lymph nodes are identified. Index periaortic lymph node measures 1.3 cm, image 40/series 10.  Other: No free fluid or fluid collections identified within the upper abdomen.  Musculoskeletal: Normal signal from within the bone marrow.  IMPRESSION: 1. Large mass within the right upper quadrant of the abdomen is of on certain origin. This is situated between the head of pancreas and descending duodenum. This likely represents either a large nodal mass or lesion arising from the head of pancreas. The mass is partially obstructing the common bile duct and exerts mass effect upon the portal  venous confluence, IVC, and descending duodenum. 2. Metastatic adenopathy noted within the left periaortic region.   Electronically Signed   By: Kerby Moors M.D.   On: 08/07/2014 09:58   Mr 3d Recon At Scanner  08/07/2014   CLINICAL DATA:  Evaluate abdominal mass  EXAM: MRI ABDOMEN WITHOUT CONTRAST  (INCLUDING MRCP)  TECHNIQUE: Multiplanar multisequence MR imaging of the abdomen was performed. Heavily T2-weighted images of the biliary and pancreatic ducts were obtained, and three-dimensional MRCP images were rendered by post processing.  COMPARISON:  08/06/2014  FINDINGS: Lower chest:  No pleural or pericardial effusion noted.  Hepatobiliary: No focal liver abnormality identified. There is intrahepatic bile duct dilatation. There is fusiform dilatation of the common bile duct which measures up to 11 mm, image 24/series 10. The gallbladder appears normal. There is a large round mass within the right upper quadrant of the abdomen situated between the descending duodenum and head of pancreas. This measures 4.8 x 4.8 x 5.1 cm. This has mass effect upon the distal common bile duct, portal vein at the venous confluence, IVC and descending duodenum.  Pancreas: There is no abnormal dilatation of the pancreatic duct. Body and tail of pancreas appear normal.  Spleen: Negative.  Adrenals/Urinary Tract: The adrenal glands are both normal. Normal appearance of the right kidney. Cyst in the left kidney measures 1.4 cm.  Stomach/Bowel: Unremarkable appearance of the stomach. Right upper quadrant mass exerts mass effect upon the descending duodenum. The visualize jejunum and colon are unremarkable.  Vascular/Lymphatic: Calcified atherosclerotic plaque involves the abdominal aorta. Multiple enlarged upper abdominal lymph nodes are identified. Index periaortic lymph node measures 1.3 cm, image 40/series 10.  Other: No free fluid or fluid collections identified within the upper abdomen.  Musculoskeletal: Normal signal from  within the bone marrow.  IMPRESSION: 1. Large mass within the right upper quadrant of the abdomen is of on certain origin. This is situated between the head of pancreas and descending duodenum. This likely represents either a large nodal mass or lesion arising from the head of pancreas. The mass is partially obstructing the common bile duct and exerts mass effect upon the portal venous confluence, IVC, and descending duodenum. 2. Metastatic adenopathy noted within the left periaortic region.   Electronically Signed   By: Kerby Moors M.D.   On: 08/07/2014 09:58    Oren Binet, MD  [facility] Pager:[phone]  If 7PM-7AM, please contact night-coverage [email or web address] Password [email or web address] 08/08/2014, 1:35 PM   LOS: 2 days

## 2014-08-08 NOTE — Progress Notes (Signed)
I had a long discussion with the patient and his wife, approximately 30 minutes.  I discussed the details of both the EUS with FNA and the ERCP with stent placement.  The risks of bleeding, infection, perforation, and pancreatitis were discussed and they wish to proceed with the procedures tomorrow.  My main goal is to place a plastic biliary stent.  I feel this is the best option before placing a metallic stent as he may respond to treatment, if he is a candidate.  A metallic stent can always be placed in the future.  The right paratracheal node has enlarged, but it was previously noted.  The new lesion is the porta hepatis mass.  I will focus my biopsy efforts on this lesion.

## 2014-08-09 ENCOUNTER — Inpatient Hospital Stay (HOSPITAL_COMMUNITY): Payer: Medicare Other | Admitting: Anesthesiology

## 2014-08-09 ENCOUNTER — Encounter (HOSPITAL_COMMUNITY): Payer: Self-pay

## 2014-08-09 ENCOUNTER — Encounter (HOSPITAL_COMMUNITY)
Admission: EM | Disposition: A | Discharge status: Home Health | Payer: Self-pay | Source: Home / Self Care | Attending: Internal Medicine

## 2014-08-09 ENCOUNTER — Inpatient Hospital Stay (HOSPITAL_COMMUNITY): Payer: Medicare Other

## 2014-08-09 DIAGNOSIS — R1084 Generalized abdominal pain: Secondary | ICD-10-CM

## 2014-08-09 HISTORY — PX: ERCP: SHX5425

## 2014-08-09 HISTORY — PX: ESOPHAGOGASTRODUODENOSCOPY (EGD) WITH PROPOFOL: SHX5813

## 2014-08-09 HISTORY — PX: EUS: SHX5427

## 2014-08-09 HISTORY — PX: FINE NEEDLE ASPIRATION: SHX5430

## 2014-08-09 LAB — COMPREHENSIVE METABOLIC PANEL
ALBUMIN: 3.3 g/dL — AB (ref 3.5–5.0)
ALT: 321 U/L — AB (ref 17–63)
AST: 127 U/L — ABNORMAL HIGH (ref 15–41)
Alkaline Phosphatase: 671 U/L — ABNORMAL HIGH (ref 38–126)
Anion gap: 10 (ref 5–15)
BUN: 14 mg/dL (ref 6–20)
CO2: 24 mmol/L (ref 22–32)
CREATININE: 1.31 mg/dL — AB (ref 0.61–1.24)
Calcium: 9.1 mg/dL (ref 8.9–10.3)
Chloride: 107 mmol/L (ref 101–111)
GFR calc Af Amer: >60 mL/min (ref >60)
GFR calc non Af Amer: 54 mL/min — ABNORMAL LOW (ref >60)
Glucose, Bld: 104 mg/dL — ABNORMAL HIGH (ref 65–99)
POTASSIUM: 3.7 mmol/L (ref 3.5–5.1)
Sodium: 141 mmol/L (ref 135–145)
Total Bilirubin: 6.8 mg/dL — ABNORMAL HIGH (ref 0.3–1.2)
Total Protein: 6.9 g/dL (ref 6.5–8.1)

## 2014-08-09 LAB — GLUCOSE, CAPILLARY
Glucose-Capillary: 109 mg/dL — ABNORMAL HIGH (ref 65–99)
Glucose-Capillary: 129 mg/dL — ABNORMAL HIGH (ref 65–99)

## 2014-08-09 SURGERY — ERCP, WITH INTERVENTION IF INDICATED
Anesthesia: General

## 2014-08-09 MED ORDER — LIDOCAINE HCL (CARDIAC) 20 MG/ML IV SOLN
INTRAVENOUS | Status: AC
  Filled 2014-08-09: qty 5

## 2014-08-09 MED ORDER — LACTATED RINGERS IV SOLN
INTRAVENOUS | Status: DC
  Administered 2014-08-09: 10:00:00 via INTRAVENOUS

## 2014-08-09 MED ORDER — SODIUM CHLORIDE 0.9 % IV SOLN
1.5000 g | Freq: Three times a day (TID) | INTRAVENOUS | Status: DC
  Administered 2014-08-09 – 2014-08-15 (×18): 1.5 g via INTRAVENOUS
  Filled 2014-08-09 (×24): qty 1.5

## 2014-08-09 MED ORDER — HYDROMORPHONE HCL 1 MG/ML IJ SOLN
INTRAMUSCULAR | Status: AC
  Filled 2014-08-09: qty 1

## 2014-08-09 MED ORDER — PROPOFOL 10 MG/ML IV BOLUS
INTRAVENOUS | Status: AC
  Filled 2014-08-09: qty 20

## 2014-08-09 MED ORDER — PHENYLEPHRINE HCL 10 MG/ML IJ SOLN
INTRAMUSCULAR | Status: DC | PRN
  Administered 2014-08-09 (×3): 40 ug via INTRAVENOUS
  Administered 2014-08-09: 80 ug via INTRAVENOUS

## 2014-08-09 MED ORDER — GLUCAGON HCL RDNA (DIAGNOSTIC) 1 MG IJ SOLR
INTRAMUSCULAR | Status: DC | PRN
  Administered 2014-08-09: 1 mg via INTRAVENOUS

## 2014-08-09 MED ORDER — REMIFENTANIL HCL 1 MG IV SOLR
INTRAVENOUS | Status: AC
  Filled 2014-08-09: qty 1000

## 2014-08-09 MED ORDER — ONDANSETRON HCL 4 MG/2ML IJ SOLN
INTRAMUSCULAR | Status: AC
Start: 2014-08-09 — End: 2014-08-09
  Filled 2014-08-09: qty 2

## 2014-08-09 MED ORDER — FENTANYL CITRATE (PF) 100 MCG/2ML IJ SOLN
INTRAMUSCULAR | Status: AC
  Filled 2014-08-09: qty 2

## 2014-08-09 MED ORDER — SUCCINYLCHOLINE CHLORIDE 20 MG/ML IJ SOLN
INTRAMUSCULAR | Status: DC | PRN
  Administered 2014-08-09: 80 mg via INTRAVENOUS

## 2014-08-09 MED ORDER — FENTANYL CITRATE (PF) 100 MCG/2ML IJ SOLN
INTRAMUSCULAR | Status: DC | PRN
  Administered 2014-08-09 (×2): 50 ug via INTRAVENOUS
  Administered 2014-08-09: 100 ug via INTRAVENOUS

## 2014-08-09 MED ORDER — ONDANSETRON HCL 4 MG/2ML IJ SOLN
4.0000 mg | INTRAMUSCULAR | Status: AC
  Administered 2014-08-09: 4 mg via INTRAVENOUS

## 2014-08-09 MED ORDER — GLUCAGON HCL RDNA (DIAGNOSTIC) 1 MG IJ SOLR
INTRAMUSCULAR | Status: AC
  Filled 2014-08-09: qty 1

## 2014-08-09 MED ORDER — SODIUM CHLORIDE 0.9 % IV SOLN
1.5000 g | Freq: Four times a day (QID) | INTRAVENOUS | Status: DC
  Administered 2014-08-09: 1.5 g via INTRAVENOUS
  Filled 2014-08-09 (×8): qty 1.5

## 2014-08-09 MED ORDER — PHENYLEPHRINE 40 MCG/ML (10ML) SYRINGE FOR IV PUSH (FOR BLOOD PRESSURE SUPPORT)
PREFILLED_SYRINGE | INTRAVENOUS | Status: AC
  Filled 2014-08-09: qty 10

## 2014-08-09 MED ORDER — SODIUM CHLORIDE 0.9 % IV SOLN
INTRAVENOUS | Status: DC
  Administered 2014-08-09 (×2): via INTRAVENOUS

## 2014-08-09 MED ORDER — PHENYLEPHRINE HCL 10 MG/ML IJ SOLN
10.0000 mg | INTRAMUSCULAR | Status: DC | PRN
  Administered 2014-08-09: 10 ug/min via INTRAVENOUS

## 2014-08-09 MED ORDER — HYDROMORPHONE HCL 1 MG/ML IJ SOLN
1.0000 mg | INTRAMUSCULAR | Status: AC
Start: 2014-08-09 — End: 2014-08-09
  Administered 2014-08-09: 1 mg via INTRAVENOUS

## 2014-08-09 MED ORDER — PROPOFOL 10 MG/ML IV BOLUS
INTRAVENOUS | Status: DC | PRN
  Administered 2014-08-09: 130 mg via INTRAVENOUS

## 2014-08-09 MED ORDER — SODIUM CHLORIDE 0.9 % IV SOLN
INTRAVENOUS | Status: DC | PRN
  Administered 2014-08-09: 110 mL

## 2014-08-09 NOTE — Interval H&P Note (Signed)
History and Physical Interval Note:  08/09/2014 9:24 AM  [name]  has presented today for surgery, with the diagnosis of Obstructive jaundice  The various methods of treatment have been discussed with the patient and family. After consideration of risks, benefits and other options for treatment, the patient has consented to  Procedure(s): ENDOSCOPIC RETROGRADE CHOLANGIOPANCREATOGRAPHY (ERCP) (N/A) UPPER ENDOSCOPIC ULTRASOUND (EUS) RADIAL (N/A) FINE NEEDLE ASPIRATION (FNA) LINEAR (N/A) as a surgical intervention .  The patient's history has been reviewed, patient examined, no change in status, stable for surgery.  I have reviewed the patient's chart and labs.  Questions were answered to the patient's satisfaction.     Drey Shaff D

## 2014-08-09 NOTE — H&P (View-Only) (Signed)
I had a long discussion with the patient and his wife, approximately 30 minutes.  I discussed the details of both the EUS with FNA and the ERCP with stent placement.  The risks of bleeding, infection, perforation, and pancreatitis were discussed and they wish to proceed with the procedures tomorrow.  My main goal is to place a plastic biliary stent.  I feel this is the best option before placing a metallic stent as he may respond to treatment, if he is a candidate.  A metallic stent can always be placed in the future.  The right paratracheal node has enlarged, but it was previously noted.  The new lesion is the porta hepatis mass.  I will focus my biopsy efforts on this lesion.

## 2014-08-09 NOTE — Anesthesia Postprocedure Evaluation (Signed)
Anesthesia Post Note  Patient: [name]  Procedure(s) Performed: Procedure(s) (LRB): ENDOSCOPIC RETROGRADE CHOLANGIOPANCREATOGRAPHY (ERCP) (N/A) UPPER ENDOSCOPIC ULTRASOUND (EUS) RADIAL (N/A) FINE NEEDLE ASPIRATION (FNA) LINEAR (N/A)  Anesthesia type: General  Patient location: PACU  Post pain: Pain level controlled  Post assessment: Post-op Vital signs reviewed  Last Vitals: BP 160/74 mmHg  Pulse 61  Temp(Src) 36.5 C (Oral)  Resp 16  Ht '5\' 8"'$  (1.727 m)  Wt 133 lb (60.328 kg)  BMI 20.23 kg/m2  SpO2 99%  Post vital signs: Reviewed  Level of consciousness: sedated  Complications: No apparent anesthesia complications

## 2014-08-09 NOTE — Op Note (Addendum)
[facility] [place], [id number]   UPPER ENDOSCOPIC ULTRASOUND PROCEDURE REPORT     EXAM DATE: 08/09/2014  PATIENT NAME:          [name], [name]          MR#: [id number] BIRTHDATE:       03/24/1946     VISIT #:     [phone] ATTENDING:     Carol Ada, MD     STATUS:     inpatient ASSISTANT:      Alena Bills and Cletis Athens  REFERRING MD: ASA CLASS:        Class III  INDICATIONS:  The patient is a 69 yr old male here for a lower endoscopic ultrasound due to abnormal MRI of the GI tract. PROCEDURE PERFORMED:     EUS with FNA and attempted ERCP with stent placement  MEDICATIONS:     General Anesthesia, Unasyn, glucagon  CONSENT: The patient understands the risks and benefits of the procedure and understands that these risks include, but are not limited to: sedation, allergic reaction, infection, perforation and/or bleeding. Alternative means of evaluation and treatment include, among others: physical exam, x-rays, and/or surgical intervention. The patient elects to proceed with this endoscopic procedure.  DESCRIPTION OF PROCEDURE: During intra-op preparation period all mechanical & medical equipment was checked for proper function. Hand hygiene and appropriate measures for infection prevention was taken. After the risks, benefits and alternatives of the procedure were thoroughly explained, Informed consent was verified, confirmed and timeout was successfully executed by the treatment team. The patient was then placed in the left, lateral, decubitus position and IV sedation was administered. Throughout the procedure, the patients blood pressure, pulse and oxygen saturations were monitored continuously. Under direct visualization, the    was introduced through the mouth and advanced to the   .  Water was used as necessary to provide an acoustic interface. The pulse, BP, and O2 saturation were monitored and documented  by the physician and nursing staff throughout the procedure. Upon completion of the imaging, water was removed and the patient was then discharged to recovery in stable condition with the appropriate post procedure care. Estimated blood loss is zero unless otherwise noted in this procedure report.  FINDINGS: The echoendoscope was advanced to the gastric lumen without difficulty.  The gastric lumen was significant for residual gastric contents.  EUS revealed a large well-circumscribed hypoechoic mass.  The mass measured 43 x 72 mm.  there was compression of the CBD and the proximal portion of of the CBD was measured to be 11 mm.  The mass did not appear to be arising from the pancreas.  Because of the large mass and the regional distortion I was not able to obtain an adequate image of the IVC, aorta, and portal vein, however, one narrow view of the portal confluence appeared to be free of mass involvement.  Five passes with the 25 gauge FNA needle were performed and excellent samples were obtained.  Care was taken to avoid needle perforation of the CBD.  The celiac axis was normal.  The right paratracheal node was not adequately evaluated.  The procedure was converted to an ERCP and the ampulla was identified.  There was some difficulty with advancement of the duodenoscope as a result of the mass effect.  On multiple tries the CBD was able to be cannulated.  Guidewire advancement became difficult in the proximal portion of the distal CBD.  Initial contrast injection  did confirm correct cannulation of the CBD as there was some filling of the biliary tree.  Along the course of the CBD the duct was dilated wtih a 4 mm balloon.  The sphincterotome was then reinserted and contrast was injected, which revealed an abnormal dispersement of the contrast.  As a result the procedure was concluded.  Radiology evaluated the fluoroscopic images and the delayed follow up with conclusion that it  was intraparenchymal extravasation (intravasation).    ADVERSE EVENTS:     Parenchymal extravasation.  IMPRESSIONS:     1) 43 X 72 mm well-circumscribed porta hepatis mass. 2) CBD obstruction. 3) Parenchymal extravasation of contrast.  RECOMMENDATIONS:     1) Continue with Unasyn. 2) Follow up liver panel. 3) IR to attempt percutaneous drainage Monday.  I discussed the case with IR. 4) Await FNA.  ___________________________________ Carol Ada, MD eSigned:  Carol Ada, MD 08/09/2014 1:45 PM   cc:      PATIENT NAME:  Jamorion, Gomillion MR#: [id number]

## 2014-08-09 NOTE — Anesthesia Procedure Notes (Signed)
Procedure Name: Intubation Date/Time: 08/09/2014 11:36 AM Performed by: Noralyn Pick D Pre-anesthesia Checklist: Patient identified, Emergency Drugs available, Suction available and Patient being monitored Patient Re-evaluated:Patient Re-evaluated prior to inductionOxygen Delivery Method: Circle System Utilized Preoxygenation: Pre-oxygenation with 100% oxygen Intubation Type: IV induction Ventilation: Mask ventilation without difficulty Laryngoscope Size: Mac and 3 Grade View: Grade II Tube type: Oral Tube size: 7.5 mm Number of attempts: 1 Airway Equipment and Method: Stylet and Oral airway Placement Confirmation: ETT inserted through vocal cords under direct vision,  positive ETCO2 and breath sounds checked- equal and bilateral Secured at: 22 cm Tube secured with: Tape Dental Injury: Teeth and Oropharynx as per pre-operative assessment

## 2014-08-09 NOTE — Progress Notes (Signed)
PATIENT DETAILS Name: [name] Age: 69 y.o. Sex: male Date of Birth: 1945-11-23 Admit Date: 08/06/2014 Admitting Physician Rise Patience, MD B[facility]:SLHTDS, Annie Main, MD  Subjective: Itching, still with scleral icterus. No other complaints. Pain is controlled.   Assessment/Plan: Principal Problem:   Obstructive jaundice: Secondary to a mass around the porta hepatis-given history of non-small cell lung cancer-suspicion for metastatic disease.plan for ERCP with stenting on 5/13. Reports his pain is better controlled.   Active Problems:    History of non-small cell lung cancer: Previously was on several chemotherapy regimens-however for the past 15 months on just observation. Oncology consulted. Plan for IR guided biopsy of the mass this admission after the ERCP.    Hypertension: Controlled, continue with atenolol and Norvasc    Constipation: Add MiraLAX. Suspect from narcotic use. Improved.      Abdominal pain, generalized: Chronic issue-suspect secondary to malignancy-continue with as needed narcotics. Controlled with pain meds.     Chronic kidney disease stage III : Creatinine close to her usual baseline, much improved when compared to admission. Monitor periodically .    Malnutrition of moderate degree: Continue with supplements   Disposition: Remain inpatient-home likely on 5/14  Antimicrobial agents  See below  Anti-infectives    Start     Dose/Rate Route Frequency Ordered Stop   08/09/14 2000  ampicillin-sulbactam (UNASYN) 1.5 g in sodium chloride 0.9 % 50 mL IVPB     1.5 g 100 mL/hr over 30 Minutes Intravenous 3 times per day 08/09/14 1400     08/09/14 0945  ampicillin-sulbactam (UNASYN) 1.5 g in sodium chloride 0.9 % 50 mL IVPB  Status:  Discontinued     1.5 g 100 mL/hr over 30 Minutes Intravenous Every 6 hours 08/09/14 0944 08/09/14 1404   08/07/14 1000  ampicillin (PRINCIPEN) capsule 250 mg  Status:  Discontinued     250 mg Oral 4 times  daily 08/07/14 0042 08/07/14 1551      DVT Prophylaxis: SCD's-resume pharmacological agents post ERCP   Code Status: Full code   Family Communication Spouse at bedside  Procedures: ERCP  CONSULTS:  GI and hematology/oncology  Time spent 20 minutes-Greater than 50% of this time was spent in counseling, explanation of diagnosis, planning of further management, and coordination of care.  MEDICATIONS: Scheduled Meds: . [MAR Hold] amLODipine  2.5 mg Oral q morning - 10a  . ampicillin-sulbactam (UNASYN) IV  1.5 g Intravenous 3 times per day  . [MAR Hold] atenolol  100 mg Oral q morning - 10a  . [MAR Hold] mirabegron ER  50 mg Oral Daily  . [MAR Hold] multivitamin with minerals   Oral Daily  . [MAR Hold] pantoprazole  40 mg Oral Daily  . [MAR Hold] polyethylene glycol  17 g Oral BID   Continuous Infusions: . sodium chloride    . lactated ringers 20 mL/hr at 08/09/14 0941   PRN Meds:.[MAR Hold]  HYDROmorphone (DILAUDID) injection, [MAR Hold] lactulose, [MAR Hold] methocarbamol, [MAR Hold] ondansetron **OR** [MAR Hold] ondansetron (ZOFRAN) IV    PHYSICAL EXAM: Vital signs in last 24 hours: Filed Vitals:   08/08/14 2106 08/09/14 0646 08/09/14 0931 08/09/14 1324  BP: 93/64 125/70 166/82 177/71  Pulse: 74 63 66 62  Temp: 98.2 F (36.8 C) 98.4 F (36.9 C) 99.2 F (37.3 C) 97.7 F (36.5 C)  TempSrc: Oral Oral Oral Oral  Resp: '18 18 16 12  '$ Height:  $'5\' 8"'Z$  (1.727 m)   Weight:   60.328 kg (133 lb)   SpO2: 97% 98% 98% 100%    Weight change:  Filed Weights   08/06/14 2247 08/09/14 0931  Weight: 60.4 kg (133 lb 2.5 oz) 60.328 kg (133 lb)   Body mass index is 20.23 kg/(m^2).   Gen Exam: Awake and alert with clear speech.  Scleral icterus Neck: Supple, No JVD.   Chest: B/L Clear.   CVS: S1 S2 Regular, no murmurs.  Abdomen: soft, BS +, non tender, non distended.  Extremities: no edema, lower extremities warm to touch. Neurologic: Non Focal.   Skin: No Rash.   Wounds:  N/A.    Intake/Output from previous day:  Intake/Output Summary (Last 24 hours) at 08/09/14 1415 Last data filed at 08/09/14 1319  Gross per 24 hour  Intake    850 ml  Output   2050 ml  Net  -1200 ml     LAB RESULTS: CBC  Recent Labs Lab 08/06/14 2001 08/07/14 0530  WBC 6.1 5.9  HGB 11.6* 10.4*  HCT 35.8* 31.0*  PLT 163 148*  MCV 94.0 93.4  MCH 30.4 31.3  MCHC 32.4 33.5  RDW 14.5 14.5  LYMPHSABS 1.3 1.2  MONOABS 0.9 0.9  EOSABS 0.1 0.1  BASOSABS 0.0 0.0    Chemistries   Recent Labs Lab 08/06/14 2001 08/07/14 0530 08/09/14 0630  NA 138 140 141  K 3.6 3.7 3.7  CL 105 104 107  CO2 '26 25 24  '$ GLUCOSE 117* 115* 104*  BUN 22* 17 14  CREATININE 1.40* 1.25* 1.31*  CALCIUM 9.4 9.0 9.1    CBG:  Recent Labs Lab 08/08/14 0011 08/08/14 0623 08/08/14 1208 08/08/14 1843 08/09/14 0010  GLUCAP 115* 112* 152* 94 109*    GFR Estimated Creatinine Clearance: 45.4 mL/min (by C-G formula based on Cr of 1.31).  Coagulation profile  Recent Labs Lab 08/07/14 0530  INR 1.04    Cardiac Enzymes No results for input(s): CKMB, TROPONINI, MYOGLOBIN in the last 168 hours.  Invalid input(s): CK  Invalid input(s): POCBNP No results for input(s): DDIMER in the last 72 hours. No results for input(s): HGBA1C in the last 72 hours. No results for input(s): CHOL, HDL, LDLCALC, TRIG, CHOLHDL, LDLDIRECT in the last 72 hours. No results for input(s): TSH, T4TOTAL, T3FREE, THYROIDAB in the last 72 hours.  Invalid input(s): FREET3 No results for input(s): VITAMINB12, FOLATE, FERRITIN, TIBC, IRON, RETICCTPCT in the last 72 hours.  Recent Labs  08/06/14 2001  LIPASE 49    Urine Studies No results for input(s): UHGB, CRYS in the last 72 hours.  Invalid input(s): UACOL, UAPR, USPG, UPH, UTP, UGL, UKET, UBIL, UNIT, UROB, ULEU, UEPI, UWBC, URBC, UBAC, CAST, UCOM, BILUA  MICROBIOLOGY: No results found for this or any previous visit (from the past 240  hour(s)).  RADIOLOGY STUDIES/RESULTS: Ct Abdomen Pelvis Wo Contrast  08/06/2014   CLINICAL DATA:  Abdominal pain. Abdominal pain with onset of symptoms 1 week ago. History of non-small cell lung carcinoma on maintenance chemotherapy.  EXAM: CT CHEST, ABDOMEN AND PELVIS WITHOUT CONTRAST  TECHNIQUE: Multidetector CT imaging of the chest, abdomen and pelvis was performed following the standard protocol without IV contrast.  COMPARISON:  CT 05/14/2014.  CT 02/05/2014.  FINDINGS: CT CHEST FINDINGS  Musculoskeletal: No thoracic spine compression fractures. No aggressive osseous lesions.  Lungs: Scarring is present extending to the RIGHT apex with dystrophic calcifications. LEFT pleural apical nodularity is chronic and compatible with scarring. Emphysema.  Central airways: Patent.  Vasculature: Atherosclerosis and coronary artery disease. RIGHT IJ Port-A-Cath. 42 mm ascending aorta.  Effusions: None.  Lymphadenopathy: There is no axillary adenopathy. The high RIGHT pretracheal nodal mass has enlarged and now measures 27 mm x 20 mm (image 19 series 2).  Esophagus: Grossly normal.  CT ABDOMEN AND PELVIS FINDINGS  Musculoskeletal: Lumbar vertebral body height is preserved. No aggressive osseous lesions identified.  Liver: Unenhanced CT was performed per clinician order. Lack of IV contrast limits sensitivity and specificity, especially for evaluation of abdominal/pelvic solid viscera.  There is a mass at the porta hepatis. It is unclear whether this arises from the porta hepatis nodes, the caudate lobe of the liver or the head of the pancreas. Favor a nodal mass or from the head of the pancreas (image 64 series 2). This demonstrates central low attenuation consistent with central necrosis. The mass measures 5 cm x 5 cm on axial imaging and this is a new finding  Spleen:  Normal.  Gallbladder:  Contracted.  No calcified stones.  Common bile duct:  Obscured by a mass in the porta hepatis.  Pancreas: Body and tail the  pancreas appear normal. The head and neck are difficult to evaluate because of mass and lack of IV contrast.  Adrenal glands:  Normal bilaterally.  Kidneys:  Within normal limits.  Stomach: Antrum and pylorus anteriorly displaced by the mass in the porta hepatis.  Small bowel:  No small bowel obstruction or inflammatory changes.  Colon:   Within normal limits.  Pelvic Genitourinary:  Normal bladder.  Peritoneum: No free fluid or free air.  Vascular/lymphatic: Tortuous aortoiliac system with dense atherosclerosis. No gross acute abnormality.  Body Wall: Tiny fat containing periumbilical hernia.  IMPRESSION: 1. Enlarging RIGHT paratracheal nodal mass now measuring 27 mm x 20 mm. This is compatible with malignant adenopathy in a patient with non-small cell lung carcinoma. 2. 42 mm ascending aorta. Recommend annual imaging followup by CTA or MRA. This recommendation follows 2010 ACCF/AHA/AATS/ACR/ASA/SCA/SCAI/SIR/STS/SVM Guidelines for the Diagnosis and Management of Patients with Thoracic Aortic Disease. Circulation. 2010; 121: A213-Y865 3. New 5 cm x 5 cm abdominal mass centered in the porta hepatis, either arising from a node in the region or from the pancreatic head. This probably represents metastatic disease given the increased adenopathy in the chest.   Electronically Signed   By: Dereck Ligas M.D.   On: 08/06/2014 21:05   Ct Chest Wo Contrast  08/06/2014   CLINICAL DATA:  Abdominal pain. Abdominal pain with onset of symptoms 1 week ago. History of non-small cell lung carcinoma on maintenance chemotherapy.  EXAM: CT CHEST, ABDOMEN AND PELVIS WITHOUT CONTRAST  TECHNIQUE: Multidetector CT imaging of the chest, abdomen and pelvis was performed following the standard protocol without IV contrast.  COMPARISON:  CT 05/14/2014.  CT 02/05/2014.  FINDINGS: CT CHEST FINDINGS  Musculoskeletal: No thoracic spine compression fractures. No aggressive osseous lesions.  Lungs: Scarring is present extending to the RIGHT  apex with dystrophic calcifications. LEFT pleural apical nodularity is chronic and compatible with scarring. Emphysema.  Central airways: Patent.  Vasculature: Atherosclerosis and coronary artery disease. RIGHT IJ Port-A-Cath. 42 mm ascending aorta.  Effusions: None.  Lymphadenopathy: There is no axillary adenopathy. The high RIGHT pretracheal nodal mass has enlarged and now measures 27 mm x 20 mm (image 19 series 2).  Esophagus: Grossly normal.  CT ABDOMEN AND PELVIS FINDINGS  Musculoskeletal: Lumbar vertebral body height is preserved. No aggressive osseous lesions identified.  Liver: Unenhanced CT  was performed per clinician order. Lack of IV contrast limits sensitivity and specificity, especially for evaluation of abdominal/pelvic solid viscera.  There is a mass at the porta hepatis. It is unclear whether this arises from the porta hepatis nodes, the caudate lobe of the liver or the head of the pancreas. Favor a nodal mass or from the head of the pancreas (image 64 series 2). This demonstrates central low attenuation consistent with central necrosis. The mass measures 5 cm x 5 cm on axial imaging and this is a new finding  Spleen:  Normal.  Gallbladder:  Contracted.  No calcified stones.  Common bile duct:  Obscured by a mass in the porta hepatis.  Pancreas: Body and tail the pancreas appear normal. The head and neck are difficult to evaluate because of mass and lack of IV contrast.  Adrenal glands:  Normal bilaterally.  Kidneys:  Within normal limits.  Stomach: Antrum and pylorus anteriorly displaced by the mass in the porta hepatis.  Small bowel:  No small bowel obstruction or inflammatory changes.  Colon:   Within normal limits.  Pelvic Genitourinary:  Normal bladder.  Peritoneum: No free fluid or free air.  Vascular/lymphatic: Tortuous aortoiliac system with dense atherosclerosis. No gross acute abnormality.  Body Wall: Tiny fat containing periumbilical hernia.  IMPRESSION: 1. Enlarging RIGHT paratracheal  nodal mass now measuring 27 mm x 20 mm. This is compatible with malignant adenopathy in a patient with non-small cell lung carcinoma. 2. 42 mm ascending aorta. Recommend annual imaging followup by CTA or MRA. This recommendation follows 2010 ACCF/AHA/AATS/ACR/ASA/SCA/SCAI/SIR/STS/SVM Guidelines for the Diagnosis and Management of Patients with Thoracic Aortic Disease. Circulation. 2010; 121: Y563-S937 3. New 5 cm x 5 cm abdominal mass centered in the porta hepatis, either arising from a node in the region or from the pancreatic head. This probably represents metastatic disease given the increased adenopathy in the chest.   Electronically Signed   By: Dereck Ligas M.D.   On: 08/06/2014 21:05   Mr Abdomen Mrcp Wo Cm  08/07/2014   CLINICAL DATA:  Evaluate abdominal mass  EXAM: MRI ABDOMEN WITHOUT CONTRAST  (INCLUDING MRCP)  TECHNIQUE: Multiplanar multisequence MR imaging of the abdomen was performed. Heavily T2-weighted images of the biliary and pancreatic ducts were obtained, and three-dimensional MRCP images were rendered by post processing.  COMPARISON:  08/06/2014  FINDINGS: Lower chest:  No pleural or pericardial effusion noted.  Hepatobiliary: No focal liver abnormality identified. There is intrahepatic bile duct dilatation. There is fusiform dilatation of the common bile duct which measures up to 11 mm, image 24/series 10. The gallbladder appears normal. There is a large round mass within the right upper quadrant of the abdomen situated between the descending duodenum and head of pancreas. This measures 4.8 x 4.8 x 5.1 cm. This has mass effect upon the distal common bile duct, portal vein at the venous confluence, IVC and descending duodenum.  Pancreas: There is no abnormal dilatation of the pancreatic duct. Body and tail of pancreas appear normal.  Spleen: Negative.  Adrenals/Urinary Tract: The adrenal glands are both normal. Normal appearance of the right kidney. Cyst in the left kidney measures 1.4 cm.   Stomach/Bowel: Unremarkable appearance of the stomach. Right upper quadrant mass exerts mass effect upon the descending duodenum. The visualize jejunum and colon are unremarkable.  Vascular/Lymphatic: Calcified atherosclerotic plaque involves the abdominal aorta. Multiple enlarged upper abdominal lymph nodes are identified. Index periaortic lymph node measures 1.3 cm, image 40/series 10.  Other: No free fluid  or fluid collections identified within the upper abdomen.  Musculoskeletal: Normal signal from within the bone marrow.  IMPRESSION: 1. Large mass within the right upper quadrant of the abdomen is of on certain origin. This is situated between the head of pancreas and descending duodenum. This likely represents either a large nodal mass or lesion arising from the head of pancreas. The mass is partially obstructing the common bile duct and exerts mass effect upon the portal venous confluence, IVC, and descending duodenum. 2. Metastatic adenopathy noted within the left periaortic region.   Electronically Signed   By: Kerby Moors M.D.   On: 08/07/2014 09:58   Mr 3d Recon At Scanner  08/07/2014   CLINICAL DATA:  Evaluate abdominal mass  EXAM: MRI ABDOMEN WITHOUT CONTRAST  (INCLUDING MRCP)  TECHNIQUE: Multiplanar multisequence MR imaging of the abdomen was performed. Heavily T2-weighted images of the biliary and pancreatic ducts were obtained, and three-dimensional MRCP images were rendered by post processing.  COMPARISON:  08/06/2014  FINDINGS: Lower chest:  No pleural or pericardial effusion noted.  Hepatobiliary: No focal liver abnormality identified. There is intrahepatic bile duct dilatation. There is fusiform dilatation of the common bile duct which measures up to 11 mm, image 24/series 10. The gallbladder appears normal. There is a large round mass within the right upper quadrant of the abdomen situated between the descending duodenum and head of pancreas. This measures 4.8 x 4.8 x 5.1 cm. This has mass  effect upon the distal common bile duct, portal vein at the venous confluence, IVC and descending duodenum.  Pancreas: There is no abnormal dilatation of the pancreatic duct. Body and tail of pancreas appear normal.  Spleen: Negative.  Adrenals/Urinary Tract: The adrenal glands are both normal. Normal appearance of the right kidney. Cyst in the left kidney measures 1.4 cm.  Stomach/Bowel: Unremarkable appearance of the stomach. Right upper quadrant mass exerts mass effect upon the descending duodenum. The visualize jejunum and colon are unremarkable.  Vascular/Lymphatic: Calcified atherosclerotic plaque involves the abdominal aorta. Multiple enlarged upper abdominal lymph nodes are identified. Index periaortic lymph node measures 1.3 cm, image 40/series 10.  Other: No free fluid or fluid collections identified within the upper abdomen.  Musculoskeletal: Normal signal from within the bone marrow.  IMPRESSION: 1. Large mass within the right upper quadrant of the abdomen is of on certain origin. This is situated between the head of pancreas and descending duodenum. This likely represents either a large nodal mass or lesion arising from the head of pancreas. The mass is partially obstructing the common bile duct and exerts mass effect upon the portal venous confluence, IVC, and descending duodenum. 2. Metastatic adenopathy noted within the left periaortic region.   Electronically Signed   By: Kerby Moors M.D.   On: 08/07/2014 09:58   Dg Ercp Biliary & Pancreatic Ducts  08/09/2014   CLINICAL DATA:  Tumor obstructing the common bile duct.  EXAM: ERCP performed by Dr. Benson Norway.  TECHNIQUE: Multiple spot images obtained with the fluoroscopic device and submitted for interpretation post-procedure.  FLUOROSCOPY TIME:  Fluoroscopy Time:  5 minutes 20 seconds  Number of Acquired Images:  8  COMPARISON:  MRI dated 08/07/2014 and CT scan dated 08/06/2014  FINDINGS: Images demonstrate contrast in the distal common bile duct.  Twelve wire was passed to the area stricture. Contrast was injected after balloon dilatation. The contrast appears to be in trans stated into the liver parenchyma. Delayed imaging demonstrates slight diffusion of the contrast into the liver parenchyma  but there does not appear to be extravasation of the contrast from the liver.  IMPRESSION: Extravasated contrast in the liver parenchyma. No evidence of leakage into the peritoneal cavity.  These images were submitted for radiologic interpretation only. Please see the procedural report for the amount of contrast and the fluoroscopy time utilized.   Electronically Signed   By: Lorriane Shire M.D.   On: 08/09/2014 14:09    Robyn Nohr, MD  [facility] Pager:[phone]  If 7PM-7AM, please contact night-coverage [email or web address] Password [email or web address] 08/09/2014, 2:15 PM   LOS: 3 days

## 2014-08-09 NOTE — Transfer of Care (Signed)
Immediate Anesthesia Transfer of Care Note  Patient: [name]  Procedure(s) Performed: Procedure(s): ENDOSCOPIC RETROGRADE CHOLANGIOPANCREATOGRAPHY (ERCP) (N/A) UPPER ENDOSCOPIC ULTRASOUND (EUS) RADIAL (N/A) FINE NEEDLE ASPIRATION (FNA) LINEAR (N/A)  Patient Location: Endoscopy Unit  Anesthesia Type:General  Level of Consciousness: awake and alert   Airway & Oxygen Therapy: Patient Spontanous Breathing and Patient connected to face mask oxygen  Post-op Assessment: Report given to RN and Post -op Vital signs reviewed and stable  Post vital signs: Reviewed and stable  Last Vitals:  Filed Vitals:   08/09/14 0931  BP: 166/82  Pulse: 66  Temp: 37.3 C  Resp: 16    Complications: No apparent anesthesia complications

## 2014-08-09 NOTE — Anesthesia Preprocedure Evaluation (Addendum)
Anesthesia Evaluation  Patient identified by MRN, date of birth, ID band Patient awake    Reviewed: Allergy & Precautions, H&P , NPO status , Patient's Chart, lab work & pertinent test results  Airway Mallampati: II  TM Distance: >3 FB Neck ROM: full    Dental  (+) Missing, Dental Advisory Given All front upper teeth are missing:   Pulmonary shortness of breath and with exertion, former smoker,  Lung cancer with mets left lung.  On chemo breath sounds clear to auscultation  Pulmonary exam normal       Cardiovascular Exercise Tolerance: Good hypertension, Pt. on medications and Pt. on home beta blockers Normal cardiovascular examRhythm:regular Rate:Normal     Neuro/Psych tremor negative neurological ROS  negative psych ROS   GI/Hepatic negative GI ROS, Neg liver ROS, GERD-  Medicated and Controlled,  Endo/Other  negative endocrine ROS  Renal/GU Renal diseasenegative Renal ROS  negative genitourinary   Musculoskeletal  (+) Arthritis -,   Abdominal   Peds  Hematology  (+) anemia , pancytopenia   Anesthesia Other Findings   Reproductive/Obstetrics negative OB ROS                            Anesthesia Physical  Anesthesia Plan  ASA: III  Anesthesia Plan: General   Post-op Pain Management:    Induction: Intravenous  Airway Management Planned: Oral ETT  Additional Equipment:   Intra-op Plan:   Post-operative Plan: Extubation in OR  Informed Consent: I have reviewed the patients History and Physical, chart, labs and discussed the procedure including the risks, benefits and alternatives for the proposed anesthesia with the patient or authorized representative who has indicated his/her understanding and acceptance.   Dental Advisory Given  Plan Discussed with: CRNA and Surgeon  Anesthesia Plan Comments:         Anesthesia Quick Evaluation

## 2014-08-10 DIAGNOSIS — C3492 Malignant neoplasm of unspecified part of left bronchus or lung: Secondary | ICD-10-CM

## 2014-08-10 LAB — GLUCOSE, CAPILLARY
GLUCOSE-CAPILLARY: 109 mg/dL — AB (ref 65–99)
GLUCOSE-CAPILLARY: 163 mg/dL — AB (ref 65–99)
Glucose-Capillary: 114 mg/dL — ABNORMAL HIGH (ref 65–99)

## 2014-08-10 NOTE — Progress Notes (Signed)
PATIENT DETAILS Name: [name] Age: 69 y.o. Sex: male Date of Birth: 02-25-1946 Admit Date: 08/06/2014 Admitting Physician Rise Patience, MD DGL:OVFIEP, Annie Main, MD  Subjective: Itching, still with scleral icterus. No other complaints. Pain is controlled. Occasional nausea.   Assessment/Plan: Principal Problem:   Obstructive jaundice: Secondary to a mass around the porta hepatis-given history of non-small cell lung cancer-suspicion for metastatic disease.underwent ERCP with stenting on 5/13, and FNA samples taken, plan for IR guided biopsy on monday. Reports his pain is better controlled.   Active Problems:    History of non-small cell lung cancer: Previously was on several chemotherapy regimens-however for the past 15 months on just observation. Oncology consulted. Plan for IR guided biopsy of the mass this admission on Monday,.    Hypertension: Controlled, continue with atenolol and Norvasc    Constipation: Add MiraLAX. Suspect from narcotic use. Improved.      Abdominal pain, generalized: Chronic issue-suspect secondary to malignancy-continue with as needed narcotics. Controlled with pain meds.     Chronic kidney disease stage III : Creatinine close to her usual baseline, much improved when compared to admission. Monitor periodically .    Malnutrition of moderate degree: Continue with supplements   Disposition: Remain inpatient-home likely on 5/14  Antimicrobial agents  See below  Anti-infectives    Start     Dose/Rate Route Frequency Ordered Stop   08/09/14 2000  ampicillin-sulbactam (UNASYN) 1.5 g in sodium chloride 0.9 % 50 mL IVPB     1.5 g 100 mL/hr over 30 Minutes Intravenous 3 times per day 08/09/14 1400     08/09/14 0945  ampicillin-sulbactam (UNASYN) 1.5 g in sodium chloride 0.9 % 50 mL IVPB  Status:  Discontinued     1.5 g 100 mL/hr over 30 Minutes Intravenous Every 6 hours 08/09/14 0944 08/09/14 1404   08/07/14 1000  ampicillin  (PRINCIPEN) capsule 250 mg  Status:  Discontinued     250 mg Oral 4 times daily 08/07/14 0042 08/07/14 1551      DVT Prophylaxis: SCD's-resume pharmacological agents post ERCP   Code Status: Full code   Family Communication Spouse at bedside  Procedures: ERCP  CONSULTS:  GI and hematology/oncology  Time spent 20 minutes-Greater than 50% of this time was spent in counseling, explanation of diagnosis, planning of further management, and coordination of care.  MEDICATIONS: Scheduled Meds: . amLODipine  2.5 mg Oral q morning - 10a  . ampicillin-sulbactam (UNASYN) IV  1.5 g Intravenous 3 times per day  . atenolol  100 mg Oral q morning - 10a  . mirabegron ER  50 mg Oral Daily  . multivitamin with minerals   Oral Daily  . pantoprazole  40 mg Oral Daily  . polyethylene glycol  17 g Oral BID   Continuous Infusions:   PRN Meds:.HYDROmorphone (DILAUDID) injection, lactulose, methocarbamol, ondansetron **OR** ondansetron (ZOFRAN) IV    PHYSICAL EXAM: Vital signs in last 24 hours: Filed Vitals:   08/09/14 2102 08/10/14 0523 08/10/14 0922 08/10/14 1458  BP: 112/64 137/70 136/79 111/67  Pulse: 80 81  60  Temp: 98 F (36.7 C) 98.2 F (36.8 C)  98.1 F (36.7 C)  TempSrc: Oral Oral  Oral  Resp: '16 18  18  '$ Height:      Weight:      SpO2: 98% 99%  96%    Weight change:  Filed Weights   08/06/14 2247 08/09/14 0931  Weight: 60.4 kg (133 lb 2.5 oz) 60.328 kg (133 lb)   Body mass index is 20.23 kg/(m^2).   Gen Exam: Awake and alert with clear speech.  Scleral icterus Neck: Supple, No JVD.   Chest: B/L Clear.   CVS: S1 S2 Regular, no murmurs.  Abdomen: soft, BS +, non tender, non distended.  Extremities: no edema, lower extremities warm to touch. Neurologic: Non Focal.   Skin: No Rash.   Wounds: N/A.    Intake/Output from previous day:  Intake/Output Summary (Last 24 hours) at 08/10/14 1543 Last data filed at 08/10/14 1300  Gross per 24 hour  Intake    830 ml    Output   1150 ml  Net   -320 ml     LAB RESULTS: CBC  Recent Labs Lab 08/06/14 2001 08/07/14 0530  WBC 6.1 5.9  HGB 11.6* 10.4*  HCT 35.8* 31.0*  PLT 163 148*  MCV 94.0 93.4  MCH 30.4 31.3  MCHC 32.4 33.5  RDW 14.5 14.5  LYMPHSABS 1.3 1.2  MONOABS 0.9 0.9  EOSABS 0.1 0.1  BASOSABS 0.0 0.0    Chemistries   Recent Labs Lab 08/06/14 2001 08/07/14 0530 08/09/14 0630  NA 138 140 141  K 3.6 3.7 3.7  CL 105 104 107  CO2 '26 25 24  '$ GLUCOSE 117* 115* 104*  BUN 22* 17 14  CREATININE 1.40* 1.25* 1.31*  CALCIUM 9.4 9.0 9.1    CBG:  Recent Labs Lab 08/08/14 1208 08/08/14 1843 08/09/14 0010 08/09/14 1812 08/10/14 1203  GLUCAP 152* 94 109* 129* 163*    GFR Estimated Creatinine Clearance: 45.4 mL/min (by C-G formula based on Cr of 1.31).  Coagulation profile  Recent Labs Lab 08/07/14 0530  INR 1.04    Cardiac Enzymes No results for input(s): CKMB, TROPONINI, MYOGLOBIN in the last 168 hours.  Invalid input(s): CK  Invalid input(s): POCBNP No results for input(s): DDIMER in the last 72 hours. No results for input(s): HGBA1C in the last 72 hours. No results for input(s): CHOL, HDL, LDLCALC, TRIG, CHOLHDL, LDLDIRECT in the last 72 hours. No results for input(s): TSH, T4TOTAL, T3FREE, THYROIDAB in the last 72 hours.  Invalid input(s): FREET3 No results for input(s): VITAMINB12, FOLATE, FERRITIN, TIBC, IRON, RETICCTPCT in the last 72 hours. No results for input(s): LIPASE, AMYLASE in the last 72 hours.  Urine Studies No results for input(s): UHGB, CRYS in the last 72 hours.  Invalid input(s): UACOL, UAPR, USPG, UPH, UTP, UGL, UKET, UBIL, UNIT, UROB, ULEU, UEPI, UWBC, URBC, UBAC, CAST, UCOM, BILUA  MICROBIOLOGY: No results found for this or any previous visit (from the past 240 hour(s)).  RADIOLOGY STUDIES/RESULTS: Ct Abdomen Pelvis Wo Contrast  08/06/2014   CLINICAL DATA:  Abdominal pain. Abdominal pain with onset of symptoms 1 week ago. History  of non-small cell lung carcinoma on maintenance chemotherapy.  EXAM: CT CHEST, ABDOMEN AND PELVIS WITHOUT CONTRAST  TECHNIQUE: Multidetector CT imaging of the chest, abdomen and pelvis was performed following the standard protocol without IV contrast.  COMPARISON:  CT 05/14/2014.  CT 02/05/2014.  FINDINGS: CT CHEST FINDINGS  Musculoskeletal: No thoracic spine compression fractures. No aggressive osseous lesions.  Lungs: Scarring is present extending to the RIGHT apex with dystrophic calcifications. LEFT pleural apical nodularity is chronic and compatible with scarring. Emphysema.  Central airways: Patent.  Vasculature: Atherosclerosis and coronary artery disease. RIGHT IJ Port-A-Cath. 42 mm ascending aorta.  Effusions: None.  Lymphadenopathy: There is no axillary adenopathy. The high RIGHT pretracheal nodal mass  has enlarged and now measures 27 mm x 20 mm (image 19 series 2).  Esophagus: Grossly normal.  CT ABDOMEN AND PELVIS FINDINGS  Musculoskeletal: Lumbar vertebral body height is preserved. No aggressive osseous lesions identified.  Liver: Unenhanced CT was performed per clinician order. Lack of IV contrast limits sensitivity and specificity, especially for evaluation of abdominal/pelvic solid viscera.  There is a mass at the porta hepatis. It is unclear whether this arises from the porta hepatis nodes, the caudate lobe of the liver or the head of the pancreas. Favor a nodal mass or from the head of the pancreas (image 64 series 2). This demonstrates central low attenuation consistent with central necrosis. The mass measures 5 cm x 5 cm on axial imaging and this is a new finding  Spleen:  Normal.  Gallbladder:  Contracted.  No calcified stones.  Common bile duct:  Obscured by a mass in the porta hepatis.  Pancreas: Body and tail the pancreas appear normal. The head and neck are difficult to evaluate because of mass and lack of IV contrast.  Adrenal glands:  Normal bilaterally.  Kidneys:  Within normal limits.   Stomach: Antrum and pylorus anteriorly displaced by the mass in the porta hepatis.  Small bowel:  No small bowel obstruction or inflammatory changes.  Colon:   Within normal limits.  Pelvic Genitourinary:  Normal bladder.  Peritoneum: No free fluid or free air.  Vascular/lymphatic: Tortuous aortoiliac system with dense atherosclerosis. No gross acute abnormality.  Body Wall: Tiny fat containing periumbilical hernia.  IMPRESSION: 1. Enlarging RIGHT paratracheal nodal mass now measuring 27 mm x 20 mm. This is compatible with malignant adenopathy in a patient with non-small cell lung carcinoma. 2. 42 mm ascending aorta. Recommend annual imaging followup by CTA or MRA. This recommendation follows 2010 ACCF/AHA/AATS/ACR/ASA/SCA/SCAI/SIR/STS/SVM Guidelines for the Diagnosis and Management of Patients with Thoracic Aortic Disease. Circulation. 2010; 121: O270-J500 3. New 5 cm x 5 cm abdominal mass centered in the porta hepatis, either arising from a node in the region or from the pancreatic head. This probably represents metastatic disease given the increased adenopathy in the chest.   Electronically Signed   By: Dereck Ligas M.D.   On: 08/06/2014 21:05   Ct Chest Wo Contrast  08/06/2014   CLINICAL DATA:  Abdominal pain. Abdominal pain with onset of symptoms 1 week ago. History of non-small cell lung carcinoma on maintenance chemotherapy.  EXAM: CT CHEST, ABDOMEN AND PELVIS WITHOUT CONTRAST  TECHNIQUE: Multidetector CT imaging of the chest, abdomen and pelvis was performed following the standard protocol without IV contrast.  COMPARISON:  CT 05/14/2014.  CT 02/05/2014.  FINDINGS: CT CHEST FINDINGS  Musculoskeletal: No thoracic spine compression fractures. No aggressive osseous lesions.  Lungs: Scarring is present extending to the RIGHT apex with dystrophic calcifications. LEFT pleural apical nodularity is chronic and compatible with scarring. Emphysema.  Central airways: Patent.  Vasculature: Atherosclerosis and  coronary artery disease. RIGHT IJ Port-A-Cath. 42 mm ascending aorta.  Effusions: None.  Lymphadenopathy: There is no axillary adenopathy. The high RIGHT pretracheal nodal mass has enlarged and now measures 27 mm x 20 mm (image 19 series 2).  Esophagus: Grossly normal.  CT ABDOMEN AND PELVIS FINDINGS  Musculoskeletal: Lumbar vertebral body height is preserved. No aggressive osseous lesions identified.  Liver: Unenhanced CT was performed per clinician order. Lack of IV contrast limits sensitivity and specificity, especially for evaluation of abdominal/pelvic solid viscera.  There is a mass at the porta hepatis. It is unclear whether  this arises from the porta hepatis nodes, the caudate lobe of the liver or the head of the pancreas. Favor a nodal mass or from the head of the pancreas (image 64 series 2). This demonstrates central low attenuation consistent with central necrosis. The mass measures 5 cm x 5 cm on axial imaging and this is a new finding  Spleen:  Normal.  Gallbladder:  Contracted.  No calcified stones.  Common bile duct:  Obscured by a mass in the porta hepatis.  Pancreas: Body and tail the pancreas appear normal. The head and neck are difficult to evaluate because of mass and lack of IV contrast.  Adrenal glands:  Normal bilaterally.  Kidneys:  Within normal limits.  Stomach: Antrum and pylorus anteriorly displaced by the mass in the porta hepatis.  Small bowel:  No small bowel obstruction or inflammatory changes.  Colon:   Within normal limits.  Pelvic Genitourinary:  Normal bladder.  Peritoneum: No free fluid or free air.  Vascular/lymphatic: Tortuous aortoiliac system with dense atherosclerosis. No gross acute abnormality.  Body Wall: Tiny fat containing periumbilical hernia.  IMPRESSION: 1. Enlarging RIGHT paratracheal nodal mass now measuring 27 mm x 20 mm. This is compatible with malignant adenopathy in a patient with non-small cell lung carcinoma. 2. 42 mm ascending aorta. Recommend annual  imaging followup by CTA or MRA. This recommendation follows 2010 ACCF/AHA/AATS/ACR/ASA/SCA/SCAI/SIR/STS/SVM Guidelines for the Diagnosis and Management of Patients with Thoracic Aortic Disease. Circulation. 2010; 121: X735-H299 3. New 5 cm x 5 cm abdominal mass centered in the porta hepatis, either arising from a node in the region or from the pancreatic head. This probably represents metastatic disease given the increased adenopathy in the chest.   Electronically Signed   By: Dereck Ligas M.D.   On: 08/06/2014 21:05   Mr Abdomen Mrcp Wo Cm  08/07/2014   CLINICAL DATA:  Evaluate abdominal mass  EXAM: MRI ABDOMEN WITHOUT CONTRAST  (INCLUDING MRCP)  TECHNIQUE: Multiplanar multisequence MR imaging of the abdomen was performed. Heavily T2-weighted images of the biliary and pancreatic ducts were obtained, and three-dimensional MRCP images were rendered by post processing.  COMPARISON:  08/06/2014  FINDINGS: Lower chest:  No pleural or pericardial effusion noted.  Hepatobiliary: No focal liver abnormality identified. There is intrahepatic bile duct dilatation. There is fusiform dilatation of the common bile duct which measures up to 11 mm, image 24/series 10. The gallbladder appears normal. There is a large round mass within the right upper quadrant of the abdomen situated between the descending duodenum and head of pancreas. This measures 4.8 x 4.8 x 5.1 cm. This has mass effect upon the distal common bile duct, portal vein at the venous confluence, IVC and descending duodenum.  Pancreas: There is no abnormal dilatation of the pancreatic duct. Body and tail of pancreas appear normal.  Spleen: Negative.  Adrenals/Urinary Tract: The adrenal glands are both normal. Normal appearance of the right kidney. Cyst in the left kidney measures 1.4 cm.  Stomach/Bowel: Unremarkable appearance of the stomach. Right upper quadrant mass exerts mass effect upon the descending duodenum. The visualize jejunum and colon are  unremarkable.  Vascular/Lymphatic: Calcified atherosclerotic plaque involves the abdominal aorta. Multiple enlarged upper abdominal lymph nodes are identified. Index periaortic lymph node measures 1.3 cm, image 40/series 10.  Other: No free fluid or fluid collections identified within the upper abdomen.  Musculoskeletal: Normal signal from within the bone marrow.  IMPRESSION: 1. Large mass within the right upper quadrant of the abdomen is of on  certain origin. This is situated between the head of pancreas and descending duodenum. This likely represents either a large nodal mass or lesion arising from the head of pancreas. The mass is partially obstructing the common bile duct and exerts mass effect upon the portal venous confluence, IVC, and descending duodenum. 2. Metastatic adenopathy noted within the left periaortic region.   Electronically Signed   By: Kerby Moors M.D.   On: 08/07/2014 09:58   Mr 3d Recon At Scanner  08/07/2014   CLINICAL DATA:  Evaluate abdominal mass  EXAM: MRI ABDOMEN WITHOUT CONTRAST  (INCLUDING MRCP)  TECHNIQUE: Multiplanar multisequence MR imaging of the abdomen was performed. Heavily T2-weighted images of the biliary and pancreatic ducts were obtained, and three-dimensional MRCP images were rendered by post processing.  COMPARISON:  08/06/2014  FINDINGS: Lower chest:  No pleural or pericardial effusion noted.  Hepatobiliary: No focal liver abnormality identified. There is intrahepatic bile duct dilatation. There is fusiform dilatation of the common bile duct which measures up to 11 mm, image 24/series 10. The gallbladder appears normal. There is a large round mass within the right upper quadrant of the abdomen situated between the descending duodenum and head of pancreas. This measures 4.8 x 4.8 x 5.1 cm. This has mass effect upon the distal common bile duct, portal vein at the venous confluence, IVC and descending duodenum.  Pancreas: There is no abnormal dilatation of the  pancreatic duct. Body and tail of pancreas appear normal.  Spleen: Negative.  Adrenals/Urinary Tract: The adrenal glands are both normal. Normal appearance of the right kidney. Cyst in the left kidney measures 1.4 cm.  Stomach/Bowel: Unremarkable appearance of the stomach. Right upper quadrant mass exerts mass effect upon the descending duodenum. The visualize jejunum and colon are unremarkable.  Vascular/Lymphatic: Calcified atherosclerotic plaque involves the abdominal aorta. Multiple enlarged upper abdominal lymph nodes are identified. Index periaortic lymph node measures 1.3 cm, image 40/series 10.  Other: No free fluid or fluid collections identified within the upper abdomen.  Musculoskeletal: Normal signal from within the bone marrow.  IMPRESSION: 1. Large mass within the right upper quadrant of the abdomen is of on certain origin. This is situated between the head of pancreas and descending duodenum. This likely represents either a large nodal mass or lesion arising from the head of pancreas. The mass is partially obstructing the common bile duct and exerts mass effect upon the portal venous confluence, IVC, and descending duodenum. 2. Metastatic adenopathy noted within the left periaortic region.   Electronically Signed   By: Kerby Moors M.D.   On: 08/07/2014 09:58   Dg Ercp Biliary & Pancreatic Ducts  08/09/2014   CLINICAL DATA:  Tumor obstructing the common bile duct.  EXAM: ERCP performed by Dr. Benson Norway.  TECHNIQUE: Multiple spot images obtained with the fluoroscopic device and submitted for interpretation post-procedure.  FLUOROSCOPY TIME:  Fluoroscopy Time:  5 minutes 20 seconds  Number of Acquired Images:  8  COMPARISON:  MRI dated 08/07/2014 and CT scan dated 08/06/2014  FINDINGS: Images demonstrate contrast in the distal common bile duct. Twelve wire was passed to the area stricture. Contrast was injected after balloon dilatation. The contrast appears to be in trans stated into the liver  parenchyma. Delayed imaging demonstrates slight diffusion of the contrast into the liver parenchyma but there does not appear to be extravasation of the contrast from the liver.  IMPRESSION: Extravasated contrast in the liver parenchyma. No evidence of leakage into the peritoneal cavity.  These images  were submitted for radiologic interpretation only. Please see the procedural report for the amount of contrast and the fluoroscopy time utilized.   Electronically Signed   By: Lorriane Shire M.D.   On: 08/09/2014 14:09    Ladora Osterberg, MD  [facility] Pager:[phone]  If 7PM-7AM, please contact night-coverage [email or web address] Password [email or web address] 08/10/2014, 3:43 PM   LOS: 4 days

## 2014-08-10 NOTE — Progress Notes (Signed)
Subjective: Since I last evaluated the patient, he seems to be doing about the same. He is awaiting biopsy results. His wife is at bedside. He has an EUS with FNA and a failed ERCP as the stent could not be placed. He has had 3 BM's today. He took some Miralax to prevent narcotic induced constipation. Still has a lot of RUQ pain.   Objective: Vital signs in last 24 hours: Temp:  [98 F (36.7 C)-98.2 F (36.8 C)] 98.1 F (36.7 C) (05/14 1458) Pulse Rate:  [60-81] 60 (05/14 1458) Resp:  [16-18] 18 (05/14 1458) BP: (111-137)/(64-79) 111/67 mmHg (05/14 1458) SpO2:  [96 %-99 %] 96 % (05/14 1458) Last BM Date: 08/09/14  Intake/Output from previous day: 05/13 0701 - 05/14 0700 In: 1350 [P.O.:350; I.V.:1000] Out: 1450 [Urine:1450] Intake/Output this shift: Total I/O In: 530 [P.O.:480; IV Piggyback:50] Out: -   General appearance: alert, cooperative, icteric and mild distress Resp: clear to auscultation bilaterally Cardio: regular rate and rhythm, S1, S2 normal, no murmur, click, rub or gallop GI: soft with RUQ tenderness on palpation with gaurding but without rebound or rigiodity; bowel sounds normal; no masses,  no organomegaly Extremities: extremities normal, atraumatic, no cyanosis or edema  Lab Results: No results for input(s): WBC, HGB, HCT, PLT in the last 72 hours. BMET  Recent Labs  08/09/14 0630  NA 141  K 3.7  CL 107  CO2 24  GLUCOSE 104*  BUN 14  CREATININE 1.31*  CALCIUM 9.1   LFT  Recent Labs  08/09/14 0630  PROT 6.9  ALBUMIN 3.3*  AST 127*  ALT 321*  ALKPHOS 671*  BILITOT 6.8*   Studies/Results: Dg Ercp Biliary & Pancreatic Ducts  08/09/2014   CLINICAL DATA:  Tumor obstructing the common bile duct.  EXAM: ERCP performed by Dr. Benson Norway.  TECHNIQUE: Multiple spot images obtained with the fluoroscopic device and submitted for interpretation post-procedure.  FLUOROSCOPY TIME:  Fluoroscopy Time:  5 minutes 20 seconds  Number of Acquired Images:  8   COMPARISON:  MRI dated 08/07/2014 and CT scan dated 08/06/2014  FINDINGS: Images demonstrate contrast in the distal common bile duct. Twelve wire was passed to the area stricture. Contrast was injected after balloon dilatation. The contrast appears to be in trans stated into the liver parenchyma. Delayed imaging demonstrates slight diffusion of the contrast into the liver parenchyma but there does not appear to be extravasation of the contrast from the liver.  IMPRESSION: Extravasated contrast in the liver parenchyma. No evidence of leakage into the peritoneal cavity.  These images were submitted for radiologic interpretation only. Please see the procedural report for the amount of contrast and the fluoroscopy time utilized.   Electronically Signed   By: Lorriane Shire M.D.   On: 08/09/2014 14:09   Medications: I have reviewed the patient's current medications.  Assessment/Plan: 1) RUQ pain with jaundice-TB of 6.8 and a mass in the porta hepatis-biopsies pending. 2) Lung cancer diagnosed in 2010-s/p treatment with chemo and radiation. 3) HTN,.  LOS: 4 days   Kalika Smay 08/10/2014, 5:23 PM

## 2014-08-11 ENCOUNTER — Encounter (HOSPITAL_COMMUNITY): Payer: Self-pay | Admitting: Radiology

## 2014-08-11 LAB — GLUCOSE, CAPILLARY
GLUCOSE-CAPILLARY: 81 mg/dL (ref 65–99)
Glucose-Capillary: 131 mg/dL — ABNORMAL HIGH (ref 65–99)
Glucose-Capillary: 105 mg/dL — ABNORMAL HIGH (ref 65–99)

## 2014-08-11 MED ORDER — HYDROMORPHONE HCL 2 MG/ML IJ SOLN
2.0000 mg | INTRAMUSCULAR | Status: DC | PRN
  Administered 2014-08-11 – 2014-08-15 (×15): 2 mg via INTRAVENOUS
  Filled 2014-08-11 (×15): qty 1

## 2014-08-11 MED ORDER — DIPHENHYDRAMINE HCL 50 MG/ML IJ SOLN
25.0000 mg | Freq: Four times a day (QID) | INTRAMUSCULAR | Status: DC | PRN
  Administered 2014-08-11 – 2014-08-15 (×9): 25 mg via INTRAVENOUS
  Filled 2014-08-11 (×9): qty 1

## 2014-08-11 NOTE — Progress Notes (Addendum)
PATIENT DETAILS Name: [name] Age: 69 y.o. Sex: male Date of Birth: [birth year] Admit Date: 08/06/2014 Admitting Physician Rise Patience, MD [id number], Annie Main, MD  Subjective: Itching, still with scleral icterus. Appears more jaundiced today and sick. Reports worsening abdominal pain since last night, '1mg'$  of dilaudid not helping, increased the dose to 2 mg of dilaudid more frequently to every 3 hours.   Assessment/Plan:    Obstructive jaundice: Secondary to a mass around the porta hepatis-given history of non-small cell lung cancer-suspicion for metastatic disease.underwent ERCP with stenting on 5/13, and FNA samples taken, plan for IR guided biopsy on monday.  Recheck cmp . Active Problems:    History of non-small cell lung cancer: Previously was on several chemotherapy regimens-however for the past 15 months on just observation. Oncology consulted. Plan for IR guided biopsy of the mass this admission on Monday,.    Hypertension:  Sub optimal Controlled, probably from pain , would  continue with atenolol and Norvasc    Constipation: Add MiraLAX. Suspect from narcotic use. Improved.      Abdominal pain, generalized: Chronic issue-suspect secondary to malignancy-continue with as needed narcotics. Controlled with pain meds.     Chronic kidney disease stage III : Creatinine close to her usual baseline, much improved when compared to admission. Monitor periodically .    Malnutrition of moderate degree: Continue with supplements   Disposition: Remain inpatient-  Antimicrobial agents  See below  Anti-infectives    Start     Dose/Rate Route Frequency Ordered Stop   08/09/14 2000  ampicillin-sulbactam (UNASYN) 1.5 g in sodium chloride 0.9 % 50 mL IVPB     1.5 g 100 mL/hr over 30 Minutes Intravenous 3 times per day 08/09/14 1400     08/09/14 0945  ampicillin-sulbactam (UNASYN) 1.5 g in sodium chloride 0.9 % 50 mL IVPB  Status:  Discontinued     1.5  g 100 mL/hr over 30 Minutes Intravenous Every 6 hours 08/09/14 0944 08/09/14 1404   08/07/14 1000  ampicillin (PRINCIPEN) capsule 250 mg  Status:  Discontinued     250 mg Oral 4 times daily 08/07/14 0042 08/07/14 1551      DVT Prophylaxis: SCD's-resume pharmacological agents post ERCP   Code Status: Full code   Family Communication Spouse at bedside  Procedures: ERCP  CONSULTS:  GI and hematology/oncology  Time spent 20 minutes-Greater than 50% of this time was spent in counseling, explanation of diagnosis, planning of further management, and coordination of care.  MEDICATIONS: Scheduled Meds: . amLODipine  2.5 mg Oral q morning - 10a  . ampicillin-sulbactam (UNASYN) IV  1.5 g Intravenous 3 times per day  . atenolol  100 mg Oral q morning - 10a  . mirabegron ER  50 mg Oral Daily  . multivitamin with minerals   Oral Daily  . pantoprazole  40 mg Oral Daily  . polyethylene glycol  17 g Oral BID   Continuous Infusions:   PRN Meds:.HYDROmorphone (DILAUDID) injection, lactulose, methocarbamol, ondansetron **OR** ondansetron (ZOFRAN) IV    PHYSICAL EXAM: Vital signs in last 24 hours: Filed Vitals:   08/10/14 0523 08/10/14 0922 08/10/14 1458 08/10/14 2036  BP: 137/70 136/79 111/67 151/74  Pulse: 81  60 64  Temp: 98.2 F (36.8 C)  98.1 F (36.7 C)   TempSrc: Oral  Oral   Resp: '18  18 16  '$ Height:      Weight:  SpO2: 99%  96% 99%    Weight change:  Filed Weights   08/06/14 2247 08/09/14 0931  Weight: 60.4 kg (133 lb 2.5 oz) 60.328 kg (133 lb)   Body mass index is 20.23 kg/(m^2).   Gen Exam: Awake and alert with clear speech.  Scleral icterus Neck: Supple, No JVD.   Chest: B/L Clear.   CVS: S1 S2 Regular, no murmurs.  Abdomen: soft, BS +, non tender, non distended.  Extremities: no edema, lower extremities warm to touch. Neurologic: Non Focal.   Skin: No Rash.   Wounds: N/A.    Intake/Output from previous day:  Intake/Output Summary (Last 24 hours)  at 08/11/14 1118 Last data filed at 08/11/14 0605  Gross per 24 hour  Intake    790 ml  Output   1800 ml  Net  -1010 ml     LAB RESULTS: CBC  Recent Labs Lab 08/06/14 2001 08/07/14 0530  WBC 6.1 5.9  HGB 11.6* 10.4*  HCT 35.8* 31.0*  PLT 163 148*  MCV 94.0 93.4  MCH 30.4 31.3  MCHC 32.4 33.5  RDW 14.5 14.5  LYMPHSABS 1.3 1.2  MONOABS 0.9 0.9  EOSABS 0.1 0.1  BASOSABS 0.0 0.0    Chemistries   Recent Labs Lab 08/06/14 2001 08/07/14 0530 08/09/14 0630  NA 138 140 141  K 3.6 3.7 3.7  CL 105 104 107  CO2 '26 25 24  '$ GLUCOSE 117* 115* 104*  BUN 22* 17 14  CREATININE 1.40* 1.25* 1.31*  CALCIUM 9.4 9.0 9.1    CBG:  Recent Labs Lab 08/09/14 1812 08/10/14 1203 08/10/14 1721 08/10/14 2339 08/11/14 0601  GLUCAP 129* 163* 114* 109* 81    GFR Estimated Creatinine Clearance: 45.4 mL/min (by C-G formula based on Cr of 1.31).  Coagulation profile  Recent Labs Lab 08/07/14 0530  INR 1.04    Cardiac Enzymes No results for input(s): CKMB, TROPONINI, MYOGLOBIN in the last 168 hours.  Invalid input(s): CK  Invalid input(s): POCBNP No results for input(s): DDIMER in the last 72 hours. No results for input(s): HGBA1C in the last 72 hours. No results for input(s): CHOL, HDL, LDLCALC, TRIG, CHOLHDL, LDLDIRECT in the last 72 hours. No results for input(s): TSH, T4TOTAL, T3FREE, THYROIDAB in the last 72 hours.  Invalid input(s): FREET3 No results for input(s): VITAMINB12, FOLATE, FERRITIN, TIBC, IRON, RETICCTPCT in the last 72 hours. No results for input(s): LIPASE, AMYLASE in the last 72 hours.  Urine Studies No results for input(s): UHGB, CRYS in the last 72 hours.  Invalid input(s): UACOL, UAPR, USPG, UPH, UTP, UGL, UKET, UBIL, UNIT, UROB, ULEU, UEPI, UWBC, URBC, UBAC, CAST, UCOM, BILUA  MICROBIOLOGY: No results found for this or any previous visit (from the past 240 hour(s)).  RADIOLOGY STUDIES/RESULTS: Ct Abdomen Pelvis Wo Contrast  08/06/2014    CLINICAL DATA:  Abdominal pain. Abdominal pain with onset of symptoms 1 week ago. History of non-small cell lung carcinoma on maintenance chemotherapy.  EXAM: CT CHEST, ABDOMEN AND PELVIS WITHOUT CONTRAST  TECHNIQUE: Multidetector CT imaging of the chest, abdomen and pelvis was performed following the standard protocol without IV contrast.  COMPARISON:  CT 05/14/2014.  CT 02/05/2014.  FINDINGS: CT CHEST FINDINGS  Musculoskeletal: No thoracic spine compression fractures. No aggressive osseous lesions.  Lungs: Scarring is present extending to the RIGHT apex with dystrophic calcifications. LEFT pleural apical nodularity is chronic and compatible with scarring. Emphysema.  Central airways: Patent.  Vasculature: Atherosclerosis and coronary artery disease. RIGHT IJ Port-A-Cath. 42 mm  ascending aorta.  Effusions: None.  Lymphadenopathy: There is no axillary adenopathy. The high RIGHT pretracheal nodal mass has enlarged and now measures 27 mm x 20 mm (image 19 series 2).  Esophagus: Grossly normal.  CT ABDOMEN AND PELVIS FINDINGS  Musculoskeletal: Lumbar vertebral body height is preserved. No aggressive osseous lesions identified.  Liver: Unenhanced CT was performed per clinician order. Lack of IV contrast limits sensitivity and specificity, especially for evaluation of abdominal/pelvic solid viscera.  There is a mass at the porta hepatis. It is unclear whether this arises from the porta hepatis nodes, the caudate lobe of the liver or the head of the pancreas. Favor a nodal mass or from the head of the pancreas (image 64 series 2). This demonstrates central low attenuation consistent with central necrosis. The mass measures 5 cm x 5 cm on axial imaging and this is a new finding  Spleen:  Normal.  Gallbladder:  Contracted.  No calcified stones.  Common bile duct:  Obscured by a mass in the porta hepatis.  Pancreas: Body and tail the pancreas appear normal. The head and neck are difficult to evaluate because of mass and lack  of IV contrast.  Adrenal glands:  Normal bilaterally.  Kidneys:  Within normal limits.  Stomach: Antrum and pylorus anteriorly displaced by the mass in the porta hepatis.  Small bowel:  No small bowel obstruction or inflammatory changes.  Colon:   Within normal limits.  Pelvic Genitourinary:  Normal bladder.  Peritoneum: No free fluid or free air.  Vascular/lymphatic: Tortuous aortoiliac system with dense atherosclerosis. No gross acute abnormality.  Body Wall: Tiny fat containing periumbilical hernia.  IMPRESSION: 1. Enlarging RIGHT paratracheal nodal mass now measuring 27 mm x 20 mm. This is compatible with malignant adenopathy in a patient with non-small cell lung carcinoma. 2. 42 mm ascending aorta. Recommend annual imaging followup by CTA or MRA. This recommendation follows 2010 ACCF/AHA/AATS/ACR/ASA/SCA/SCAI/SIR/STS/SVM Guidelines for the Diagnosis and Management of Patients with Thoracic Aortic Disease. Circulation. 2010; 121: C588-F027 3. New 5 cm x 5 cm abdominal mass centered in the porta hepatis, either arising from a node in the region or from the pancreatic head. This probably represents metastatic disease given the increased adenopathy in the chest.   Electronically Signed   By: Dereck Ligas M.D.   On: 08/06/2014 21:05   Ct Chest Wo Contrast  08/06/2014   CLINICAL DATA:  Abdominal pain. Abdominal pain with onset of symptoms 1 week ago. History of non-small cell lung carcinoma on maintenance chemotherapy.  EXAM: CT CHEST, ABDOMEN AND PELVIS WITHOUT CONTRAST  TECHNIQUE: Multidetector CT imaging of the chest, abdomen and pelvis was performed following the standard protocol without IV contrast.  COMPARISON:  CT 05/14/2014.  CT 02/05/2014.  FINDINGS: CT CHEST FINDINGS  Musculoskeletal: No thoracic spine compression fractures. No aggressive osseous lesions.  Lungs: Scarring is present extending to the RIGHT apex with dystrophic calcifications. LEFT pleural apical nodularity is chronic and compatible  with scarring. Emphysema.  Central airways: Patent.  Vasculature: Atherosclerosis and coronary artery disease. RIGHT IJ Port-A-Cath. 42 mm ascending aorta.  Effusions: None.  Lymphadenopathy: There is no axillary adenopathy. The high RIGHT pretracheal nodal mass has enlarged and now measures 27 mm x 20 mm (image 19 series 2).  Esophagus: Grossly normal.  CT ABDOMEN AND PELVIS FINDINGS  Musculoskeletal: Lumbar vertebral body height is preserved. No aggressive osseous lesions identified.  Liver: Unenhanced CT was performed per clinician order. Lack of IV contrast limits sensitivity and specificity, especially for  evaluation of abdominal/pelvic solid viscera.  There is a mass at the porta hepatis. It is unclear whether this arises from the porta hepatis nodes, the caudate lobe of the liver or the head of the pancreas. Favor a nodal mass or from the head of the pancreas (image 64 series 2). This demonstrates central low attenuation consistent with central necrosis. The mass measures 5 cm x 5 cm on axial imaging and this is a new finding  Spleen:  Normal.  Gallbladder:  Contracted.  No calcified stones.  Common bile duct:  Obscured by a mass in the porta hepatis.  Pancreas: Body and tail the pancreas appear normal. The head and neck are difficult to evaluate because of mass and lack of IV contrast.  Adrenal glands:  Normal bilaterally.  Kidneys:  Within normal limits.  Stomach: Antrum and pylorus anteriorly displaced by the mass in the porta hepatis.  Small bowel:  No small bowel obstruction or inflammatory changes.  Colon:   Within normal limits.  Pelvic Genitourinary:  Normal bladder.  Peritoneum: No free fluid or free air.  Vascular/lymphatic: Tortuous aortoiliac system with dense atherosclerosis. No gross acute abnormality.  Body Wall: Tiny fat containing periumbilical hernia.  IMPRESSION: 1. Enlarging RIGHT paratracheal nodal mass now measuring 27 mm x 20 mm. This is compatible with malignant adenopathy in a patient  with non-small cell lung carcinoma. 2. 42 mm ascending aorta. Recommend annual imaging followup by CTA or MRA. This recommendation follows 2010 ACCF/AHA/AATS/ACR/ASA/SCA/SCAI/SIR/STS/SVM Guidelines for the Diagnosis and Management of Patients with Thoracic Aortic Disease. Circulation. 2010; 121: K998-P382 3. New 5 cm x 5 cm abdominal mass centered in the porta hepatis, either arising from a node in the region or from the pancreatic head. This probably represents metastatic disease given the increased adenopathy in the chest.   Electronically Signed   By: Dereck Ligas M.D.   On: 08/06/2014 21:05   Mr Abdomen Mrcp Wo Cm  08/07/2014   CLINICAL DATA:  Evaluate abdominal mass  EXAM: MRI ABDOMEN WITHOUT CONTRAST  (INCLUDING MRCP)  TECHNIQUE: Multiplanar multisequence MR imaging of the abdomen was performed. Heavily T2-weighted images of the biliary and pancreatic ducts were obtained, and three-dimensional MRCP images were rendered by post processing.  COMPARISON:  08/06/2014  FINDINGS: Lower chest:  No pleural or pericardial effusion noted.  Hepatobiliary: No focal liver abnormality identified. There is intrahepatic bile duct dilatation. There is fusiform dilatation of the common bile duct which measures up to 11 mm, image 24/series 10. The gallbladder appears normal. There is a large round mass within the right upper quadrant of the abdomen situated between the descending duodenum and head of pancreas. This measures 4.8 x 4.8 x 5.1 cm. This has mass effect upon the distal common bile duct, portal vein at the venous confluence, IVC and descending duodenum.  Pancreas: There is no abnormal dilatation of the pancreatic duct. Body and tail of pancreas appear normal.  Spleen: Negative.  Adrenals/Urinary Tract: The adrenal glands are both normal. Normal appearance of the right kidney. Cyst in the left kidney measures 1.4 cm.  Stomach/Bowel: Unremarkable appearance of the stomach. Right upper quadrant mass exerts mass  effect upon the descending duodenum. The visualize jejunum and colon are unremarkable.  Vascular/Lymphatic: Calcified atherosclerotic plaque involves the abdominal aorta. Multiple enlarged upper abdominal lymph nodes are identified. Index periaortic lymph node measures 1.3 cm, image 40/series 10.  Other: No free fluid or fluid collections identified within the upper abdomen.  Musculoskeletal: Normal signal from within the  bone marrow.  IMPRESSION: 1. Large mass within the right upper quadrant of the abdomen is of on certain origin. This is situated between the head of pancreas and descending duodenum. This likely represents either a large nodal mass or lesion arising from the head of pancreas. The mass is partially obstructing the common bile duct and exerts mass effect upon the portal venous confluence, IVC, and descending duodenum. 2. Metastatic adenopathy noted within the left periaortic region.   Electronically Signed   By: Kerby Moors M.D.   On: 08/07/2014 09:58   Mr 3d Recon At Scanner  08/07/2014   CLINICAL DATA:  Evaluate abdominal mass  EXAM: MRI ABDOMEN WITHOUT CONTRAST  (INCLUDING MRCP)  TECHNIQUE: Multiplanar multisequence MR imaging of the abdomen was performed. Heavily T2-weighted images of the biliary and pancreatic ducts were obtained, and three-dimensional MRCP images were rendered by post processing.  COMPARISON:  08/06/2014  FINDINGS: Lower chest:  No pleural or pericardial effusion noted.  Hepatobiliary: No focal liver abnormality identified. There is intrahepatic bile duct dilatation. There is fusiform dilatation of the common bile duct which measures up to 11 mm, image 24/series 10. The gallbladder appears normal. There is a large round mass within the right upper quadrant of the abdomen situated between the descending duodenum and head of pancreas. This measures 4.8 x 4.8 x 5.1 cm. This has mass effect upon the distal common bile duct, portal vein at the venous confluence, IVC and  descending duodenum.  Pancreas: There is no abnormal dilatation of the pancreatic duct. Body and tail of pancreas appear normal.  Spleen: Negative.  Adrenals/Urinary Tract: The adrenal glands are both normal. Normal appearance of the right kidney. Cyst in the left kidney measures 1.4 cm.  Stomach/Bowel: Unremarkable appearance of the stomach. Right upper quadrant mass exerts mass effect upon the descending duodenum. The visualize jejunum and colon are unremarkable.  Vascular/Lymphatic: Calcified atherosclerotic plaque involves the abdominal aorta. Multiple enlarged upper abdominal lymph nodes are identified. Index periaortic lymph node measures 1.3 cm, image 40/series 10.  Other: No free fluid or fluid collections identified within the upper abdomen.  Musculoskeletal: Normal signal from within the bone marrow.  IMPRESSION: 1. Large mass within the right upper quadrant of the abdomen is of on certain origin. This is situated between the head of pancreas and descending duodenum. This likely represents either a large nodal mass or lesion arising from the head of pancreas. The mass is partially obstructing the common bile duct and exerts mass effect upon the portal venous confluence, IVC, and descending duodenum. 2. Metastatic adenopathy noted within the left periaortic region.   Electronically Signed   By: Kerby Moors M.D.   On: 08/07/2014 09:58   Dg Ercp Biliary & Pancreatic Ducts  08/09/2014   CLINICAL DATA:  Tumor obstructing the common bile duct.  EXAM: ERCP performed by Dr. Benson Norway.  TECHNIQUE: Multiple spot images obtained with the fluoroscopic device and submitted for interpretation post-procedure.  FLUOROSCOPY TIME:  Fluoroscopy Time:  5 minutes 20 seconds  Number of Acquired Images:  8  COMPARISON:  MRI dated 08/07/2014 and CT scan dated 08/06/2014  FINDINGS: Images demonstrate contrast in the distal common bile duct. Twelve wire was passed to the area stricture. Contrast was injected after balloon  dilatation. The contrast appears to be in trans stated into the liver parenchyma. Delayed imaging demonstrates slight diffusion of the contrast into the liver parenchyma but there does not appear to be extravasation of the contrast from the liver.  IMPRESSION: Extravasated contrast in the liver parenchyma. No evidence of leakage into the peritoneal cavity.  These images were submitted for radiologic interpretation only. Please see the procedural report for the amount of contrast and the fluoroscopy time utilized.   Electronically Signed   By: Lorriane Shire M.D.   On: 08/09/2014 14:09    Verle Brillhart, MD  [facility] Pager:[phone]  If 7PM-7AM, please contact night-coverage [email or web address] Password [email or web address] 08/11/2014, 11:18 AM   LOS: 5 days

## 2014-08-11 NOTE — Progress Notes (Signed)
Subjective: Since I last evaluated the patient, he has been doing fairly well. Quite emotional about the new findings on the recent CT scan. He is wondering when is time for "diminishing returns" on his workup. He claims he wants to be realistic about his expectations. Radiology PA is in the room discussing the procedure PTC/with a biliary drain. Having some pruritis.   Objective: Vital signs in last 24 hours: Temp:  [98.1 F (36.7 C)] 98.1 F (36.7 C) (05/14 1458) Pulse Rate:  [60-64] 64 (05/14 2036) Resp:  [16-18] 16 (05/14 2036) BP: (111-151)/(67-74) 151/74 mmHg (05/14 2036) SpO2:  [96 %-99 %] 99 % (05/14 2036) Last BM Date: 08/10/14  Intake/Output from previous day: 05/14 0701 - 05/15 0700 In: 1030 [P.O.:980; IV Piggyback:50] Out: 1800 [Urine:1800] Intake/Output this shift:   General appearance: alert, cooperative, appears older than stated age, cachectic and icteric Resp: clear to auscultation bilaterally Cardio: regular rate and rhythm, S1, S2 normal, no murmur, click, rub or gallop GI: soft with RUQ tenderness on palpation; bowel sounds normal; no masses,  no organomegaly Extremities: extremities normal, atraumatic, no cyanosis or edema  Lab Results:  BMET  Recent Labs  08/09/14 0630  NA 141  K 3.7  CL 107  CO2 24  GLUCOSE 104*  BUN 14  CREATININE 1.31*  CALCIUM 9.1   LFT  Recent Labs  08/09/14 0630  PROT 6.9  ALBUMIN 3.3*  AST 127*  ALT 321*  ALKPHOS 671*  BILITOT 6.8*   Studies/Results: Dg Ercp Biliary & Pancreatic Ducts  08/09/2014   CLINICAL DATA:  Tumor obstructing the common bile duct.  EXAM: ERCP performed by Dr. Benson Norway.  TECHNIQUE: Multiple spot images obtained with the fluoroscopic device and submitted for interpretation post-procedure.  FLUOROSCOPY TIME:  Fluoroscopy Time:  5 minutes 20 seconds  Number of Acquired Images:  8  COMPARISON:  MRI dated 08/07/2014 and CT scan dated 08/06/2014  FINDINGS: Images demonstrate contrast in the distal common  bile duct. Twelve wire was passed to the area stricture. Contrast was injected after balloon dilatation. The contrast appears to be in trans stated into the liver parenchyma. Delayed imaging demonstrates slight diffusion of the contrast into the liver parenchyma but there does not appear to be extravasation of the contrast from the liver.  IMPRESSION: Extravasated contrast in the liver parenchyma. No evidence of leakage into the peritoneal cavity.  These images were submitted for radiologic interpretation only. Please see the procedural report for the amount of contrast and the fluoroscopy time utilized.   Electronically Signed   By: Lorriane Shire M.D.   On: 08/09/2014 14:09   Medications: I have reviewed the patient's current medications.  Assessment/Plan: 1) Jaundice with a mass in the porta hepatitis. PTC with placement of biliary drain planned for tomorrow by Dr. Wallene Dales.  2) History of lung cancer-s/p chemo and radiation.  3) Pruritis: patient does not want to take any medications for this at the present time. 4) Malnutrition.  LOS: 5 days   [name] 08/11/2014, 10:47 AM

## 2014-08-11 NOTE — Progress Notes (Signed)
.   Referring Physician(s): Dr. Benson Norway and Dr. Karleen Hampshire  Subjective: 69 yo male with obstructive jaundice. S/p ERCP with biopsies pending. IR requested to place perc biliary drain. LFTs/TBili cont to trend up. Imaging initially reviewed by Dr. Pascal Lux. Chart, PMHx, meds, labs, imaging reviewed.  Past Medical History  Diagnosis Date  . Hypertension   . Arthritis RIGHT SHOULDER  . Immature cataract BILATERAL  . Non-small cell lung cancer DX SEPT 2010  W/ CHEMORADIATION AT THAT TIME -- NOW  W/ METS--  CURRENTLY ON MAINTENANCE CHEMO TX  EVERY 30 DAYS    ONCOLOGIST- DR [facility]  . Acute meniscal tear of knee RIGHT KNEE  . BPH (benign prostatic hypertrophy)   . Dizziness and giddiness 04/23/2014  . Tremor 04/23/2014    Right hand  . HOH (hard of hearing)     bilateral hearing aids   Past Surgical History  Procedure Laterality Date  . 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 Rotator cuff repair  2008    LEFT SHOULDER  . Bilateral ear drum surgery  1960'S  . Orif left ring finger  and revascularization of radial side  02-16-2007    DEGLOVING INJURY  . Knee arthroscopy  12/21/2011    Procedure: ARTHROSCOPY KNEE;  Surgeon: Tobi Bastos, MD;  Location: [facility];  Service: Orthopedics;  Laterality: Right;  WITH MEDIAL MENISECTOMY  . Cataract extraction Bilateral   . Tonsillectomy      as child  . Ovarian cyst surgery      removed from left jaw area- benign  . Ovarian cyst surgery       NOTE; pt unaware of why this is in his chart  . Shoulder open rotator cuff repair Right 06/05/2014    Procedure: RIGHT ROTATOR CUFF REPAIR SHOULDER OPEN;  Surgeon: Latanya Maudlin, MD;  Location: [facility];  Service: Orthopedics;  Laterality: Right;   History   Social History  . Marital Status: Married    Spouse Name: N/A  . Number of Children: 3  . Years of Education: college   Occupational History  . ELECTRICIAN       Retired   Social History Main Topics  . Smoking status: Former Smoker -- 2.00 packs/day for 20 years    Types: Cigarettes    Quit date: 03/17/1981  . Smokeless tobacco: Never Used  . Alcohol Use: 3.5 oz/week    7 Standard drinks or equivalent per week     Comment: beer daily  . Drug Use: No  . Sexual Activity: Not on file   Other Topics Concern  . Not on file   Social History Narrative   Patient is right handed.   Patient drinks 1-2 cups caffeine daily.    Allergies: Augmentin; Carboplatin; Levaquin; Percocet; and Zolpidem tartrate  Medications:  Current facility-administered medications:  .  amLODipine (NORVASC) tablet 2.5 mg, 2.5 mg, Oral, q morning - 10a, Rise Patience, MD, 2.5 mg at 08/11/14 1012 .  ampicillin-sulbactam (UNASYN) 1.5 g in sodium chloride 0.9 % 50 mL IVPB, 1.5 g, Intravenous, 3 times per day, Carol Ada, MD, 1.5 g at 08/11/14 0600 .  atenolol (TENORMIN) tablet 100 mg, 100 mg, Oral, q morning - 10a, Rise Patience, MD, 100 mg at 08/11/14 1011 .  HYDROmorphone (DILAUDID) injection 2 mg, 2 mg, Intravenous, Q3H PRN, Hosie Poisson, MD, 2 mg at 08/11/14 1048 .  lactulose (CHRONULAC) 10 GM/15ML  solution 30 g, 30 g, Oral, Daily PRN, Jonetta Osgood, MD .  methocarbamol (ROBAXIN) tablet 500 mg, 500 mg, Oral, Q6H PRN, Rise Patience, MD .  mirabegron ER [facility]) tablet 50 mg, 50 mg, Oral, Daily, Rise Patience, MD, 50 mg at 08/11/14 1011 .  multivitamin with minerals tablet, , Oral, Daily, Rise Patience, MD, 1 tablet at 08/11/14 1011 .  ondansetron (ZOFRAN) tablet 4 mg, 4 mg, Oral, Q6H PRN **OR** ondansetron (ZOFRAN) injection 4 mg, 4 mg, Intravenous, Q6H PRN, Rise Patience, MD .  pantoprazole (PROTONIX) EC tablet 40 mg, 40 mg, Oral, Daily, Rise Patience, MD, 40 mg at 08/11/14 1011 .  polyethylene glycol (MIRALAX / GLYCOLAX) packet 17 g, 17 g, Oral, BID, Jonetta Osgood, MD, 17 g at 08/11/14 1011   Review of Systems   Constitutional: Positive for unexpected weight change. Negative for fever and activity change.  HENT: Negative.   Respiratory: Negative.   Cardiovascular: Negative.   Gastrointestinal:       Mild abd pain  Genitourinary: Negative.   Skin: Positive for color change.       Progressive jaundice  Neurological: Negative.      Vital Signs: BP 151/74 mmHg  Pulse 64  Temp(Src) 98.1 F (36.7 C) (Oral)  Resp 16  Ht 5' 8"  (1.727 m)  Wt 133 lb (60.328 kg)  BMI 20.23 kg/m2  SpO2 99%  Physical Exam  Constitutional: He is oriented to person, place, and time. He appears well-developed.  Eyes: Scleral icterus is present.  Neck: Normal range of motion. Neck supple. No tracheal deviation present. No thyromegaly present.  Cardiovascular: Normal rate, regular rhythm and normal heart sounds.   Pulmonary/Chest: Effort normal and breath sounds normal. No respiratory distress.  Abdominal: Soft. He exhibits no distension. There is no tenderness.  Neurological: He is alert and oriented to person, place, and time. No cranial nerve deficit.  Skin: He is not diaphoretic.  Jaundiced   Psychiatric: He has a normal mood and affect. Judgment normal.    Imaging: Dg Ercp Biliary & Pancreatic Ducts  08/09/2014   CLINICAL DATA:  Tumor obstructing the common bile duct.  EXAM: ERCP performed by Dr. Benson Norway.  TECHNIQUE: Multiple spot images obtained with the fluoroscopic device and submitted for interpretation post-procedure.  FLUOROSCOPY TIME:  Fluoroscopy Time:  5 minutes 20 seconds  Number of Acquired Images:  8  COMPARISON:  MRI dated 08/07/2014 and CT scan dated 08/06/2014  FINDINGS: Images demonstrate contrast in the distal common bile duct. Twelve wire was passed to the area stricture. Contrast was injected after balloon dilatation. The contrast appears to be in trans stated into the liver parenchyma. Delayed imaging demonstrates slight diffusion of the contrast into the liver parenchyma but there does not  appear to be extravasation of the contrast from the liver.  IMPRESSION: Extravasated contrast in the liver parenchyma. No evidence of leakage into the peritoneal cavity.  These images were submitted for radiologic interpretation only. Please see the procedural report for the amount of contrast and the fluoroscopy time utilized.   Electronically Signed   By: Lorriane Shire M.D.   On: 08/09/2014 14:09    Labs:  CBC:  Recent Labs  05/14/14 1021 07/02/14 1036 08/06/14 2001 08/07/14 0530  WBC 6.9 8.2 6.1 5.9  HGB 11.7* 11.5* 11.6* 10.4*  HCT 34.9* 34.9* 35.8* 31.0*  PLT 159 161 163 148*    COAGS:  Recent Labs  06/03/14 1335 08/07/14 0530  INR 1.17  1.04  APTT 37  --     BMP:  Recent Labs  07/02/14 1015 08/06/14 2001 08/07/14 0530 08/09/14 0630  NA 140 138 140 141  K 4.2 3.6 3.7 3.7  CL  --  105 104 107  CO2 23 26 25 24   GLUCOSE 101 117* 115* 104*  BUN 19.3 22* 17 14  CALCIUM 9.3 9.4 9.0 9.1  CREATININE 1.4* 1.40* 1.25* 1.31*  GFRNONAA  --  50* 57* 54*  GFRAA  --  58* >60 >60    LIVER FUNCTION TESTS:  Recent Labs  07/02/14 1015 08/06/14 2001 08/07/14 0530 08/09/14 0630  BILITOT 0.57 5.0* 5.6* 6.8*  AST 18 178* 153* 127*  ALT 23 520* 425* 321*  ALKPHOS 116 749* 667* 671*  PROT 7.1 7.5 6.6 6.9  ALBUMIN 3.6 3.7 3.2* 3.3*    Assessment and Plan: 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 Tentative plan for PTC/Bili drain placement tomorrow. Labs reviewed. Procedure, risks, complications, use of sedation discussed. Consent signed in chart    I spent a total of 20 minutes face to face in clinical consultation/evaluation, greater than 50% of which was counseling/coordinating care for obstructive jaundice requiring biliary drain.  SignedAscencion Dike 08/11/2014, 11:40 AM

## 2014-08-12 ENCOUNTER — Encounter (HOSPITAL_COMMUNITY): Payer: Self-pay | Admitting: Gastroenterology

## 2014-08-12 ENCOUNTER — Inpatient Hospital Stay (HOSPITAL_COMMUNITY): Payer: Medicare Other

## 2014-08-12 DIAGNOSIS — C7989 Secondary malignant neoplasm of other specified sites: Secondary | ICD-10-CM

## 2014-08-12 DIAGNOSIS — C3411 Malignant neoplasm of upper lobe, right bronchus or lung: Secondary | ICD-10-CM

## 2014-08-12 LAB — COMPREHENSIVE METABOLIC PANEL
ALBUMIN: 2.8 g/dL — AB (ref 3.5–5.0)
ALT: 178 U/L — ABNORMAL HIGH (ref 17–63)
AST: 96 U/L — AB (ref 15–41)
Alkaline Phosphatase: 615 U/L — ABNORMAL HIGH (ref 38–126)
Anion gap: 9 (ref 5–15)
BUN: 17 mg/dL (ref 6–20)
CALCIUM: 8.8 mg/dL — AB (ref 8.9–10.3)
CO2: 23 mmol/L (ref 22–32)
CREATININE: 1.25 mg/dL — AB (ref 0.61–1.24)
Chloride: 109 mmol/L (ref 101–111)
GFR calc Af Amer: >60 mL/min (ref >60)
GFR, EST NON AFRICAN AMERICAN: 57 mL/min — AB (ref >60)
Glucose, Bld: 97 mg/dL (ref 65–99)
Potassium: 3.6 mmol/L (ref 3.5–5.1)
Sodium: 141 mmol/L (ref 135–145)
Total Bilirubin: 9 mg/dL — ABNORMAL HIGH (ref 0.3–1.2)
Total Protein: 6.5 g/dL (ref 6.5–8.1)

## 2014-08-12 LAB — GLUCOSE, CAPILLARY
GLUCOSE-CAPILLARY: 93 mg/dL (ref 65–99)
GLUCOSE-CAPILLARY: 109 mg/dL — AB (ref 65–99)
Glucose-Capillary: 105 mg/dL — ABNORMAL HIGH (ref 65–99)
Glucose-Capillary: 83 mg/dL (ref 65–99)
Glucose-Capillary: 97 mg/dL (ref 65–99)

## 2014-08-12 MED ORDER — IOHEXOL 300 MG/ML  SOLN
50.0000 mL | Freq: Once | INTRAMUSCULAR | Status: AC | PRN
  Administered 2014-08-12: 20 mL

## 2014-08-12 MED ORDER — LIDOCAINE HCL 1 % IJ SOLN
INTRAMUSCULAR | Status: AC
  Filled 2014-08-12: qty 20

## 2014-08-12 MED ORDER — MIDAZOLAM HCL 2 MG/2ML IJ SOLN
INTRAMUSCULAR | Status: AC | PRN
  Administered 2014-08-12: 1 mg via INTRAVENOUS
  Administered 2014-08-12: 0.5 mg via INTRAVENOUS
  Administered 2014-08-12: 1 mg via INTRAVENOUS
  Administered 2014-08-12 (×3): 0.5 mg via INTRAVENOUS

## 2014-08-12 MED ORDER — MIDAZOLAM HCL 2 MG/2ML IJ SOLN
INTRAMUSCULAR | Status: AC
  Filled 2014-08-12: qty 6

## 2014-08-12 MED ORDER — FENTANYL CITRATE (PF) 100 MCG/2ML IJ SOLN
INTRAMUSCULAR | Status: AC | PRN
  Administered 2014-08-12 (×3): 25 ug via INTRAVENOUS

## 2014-08-12 MED ORDER — FENTANYL CITRATE (PF) 100 MCG/2ML IJ SOLN
INTRAMUSCULAR | Status: AC
  Filled 2014-08-12: qty 4

## 2014-08-12 NOTE — Progress Notes (Signed)
PATIENT DETAILS Name: [name] Age: 69 y.o. Sex: male Date of Birth: [birth year] Admit Date: 08/06/2014 Admitting Physician Rise Patience, MD IFO:YDXAJO, Annie Main, MD  Subjective: Itching, still with scleral icterus. Appears more jaundiced today and sick. Reports worsening abdominal pain since last night, '1mg'$  of dilaudid not helping, increased the dose to 2 mg of dilaudid more frequently to every 3 hours.   Assessment/Plan:    Obstructive jaundice: Secondary to a mass around the porta hepatis-given history of non-small cell lung cancer-suspicion for metastatic disease.underwent ERCP with stenting on 5/13, and FNA samples taken, plan for IR guided percutaneous transhepatic cholangiogram today.   Recheck cmp shows improvement in transminases but elevation in bilirubin. . Active Problems:    History of non-small cell lung cancer: Previously was on several chemotherapy regimens-however for the past 15 months on just observation. Oncology consulted. Plan for IR guided  Percutaneous transhepatic cholangiogram and drain put in .    Hypertension:  Sub optimal Controlled, probably from pain , would  continue with atenolol and Norvasc    Constipation: Add MiraLAX. Suspect from narcotic use. Improved.      Abdominal pain, generalized: Chronic issue-suspect secondary to malignancy-continue with as needed narcotics. Controlled with pain meds.     Chronic kidney disease stage III : Creatinine close to her usual baseline, much improved when compared to admission. Monitor periodically .    Malnutrition of moderate degree: Continue with supplements   Disposition: Remain inpatient-  Antimicrobial agents  See below  Anti-infectives    Start     Dose/Rate Route Frequency Ordered Stop   08/09/14 2000  ampicillin-sulbactam (UNASYN) 1.5 g in sodium chloride 0.9 % 50 mL IVPB     1.5 g 100 mL/hr over 30 Minutes Intravenous 3 times per day 08/09/14 1400     08/09/14 0945   ampicillin-sulbactam (UNASYN) 1.5 g in sodium chloride 0.9 % 50 mL IVPB  Status:  Discontinued     1.5 g 100 mL/hr over 30 Minutes Intravenous Every 6 hours 08/09/14 0944 08/09/14 1404   08/07/14 1000  ampicillin (PRINCIPEN) capsule 250 mg  Status:  Discontinued     250 mg Oral 4 times daily 08/07/14 0042 08/07/14 1551      DVT Prophylaxis: SCD's-resume pharmacological agents post ERCP   Code Status: Full code   Family Communication Spouse at bedside  Procedures: ERCP  CONSULTS:  GI and hematology/oncology  Time spent 20 minutes-Greater than 50% of this time was spent in counseling, explanation of diagnosis, planning of further management, and coordination of care.  MEDICATIONS: Scheduled Meds: . amLODipine  2.5 mg Oral q morning - 10a  . ampicillin-sulbactam (UNASYN) IV  1.5 g Intravenous 3 times per day  . atenolol  100 mg Oral q morning - 10a  . lidocaine      . mirabegron ER  50 mg Oral Daily  . multivitamin with minerals   Oral Daily  . pantoprazole  40 mg Oral Daily  . polyethylene glycol  17 g Oral BID   Continuous Infusions:   PRN Meds:.diphenhydrAMINE, HYDROmorphone (DILAUDID) injection, lactulose, methocarbamol, ondansetron **OR** ondansetron (ZOFRAN) IV    PHYSICAL EXAM: Vital signs in last 24 hours: Filed Vitals:   08/12/14 1131 08/12/14 1201 08/12/14 1232 08/12/14 1411  BP: 142/66 138/66 140/72 121/69  Pulse: 70 88 90 62  Temp: 98.9 F (37.2 C) 98.6 F (37 C) 98.8 F (37.1 C) 97.8 F (  36.6 C)  TempSrc: Oral Oral Oral Axillary  Resp: '16 18 16 16  '$ Height:      Weight:      SpO2: 98% 96% 98% 96%    Weight change:  Filed Weights   08/06/14 2247 08/09/14 0931  Weight: 60.4 kg (133 lb 2.5 oz) 60.328 kg (133 lb)   Body mass index is 20.23 kg/(m^2).   Gen Exam: Awake and alert with clear speech.  Scleral icterus Neck: Supple, No JVD.   Chest: B/L Clear.   CVS: S1 S2 Regular, no murmurs.  Abdomen: soft, BS +, tender int he right upper  quadrant, non distended.  Extremities: no edema, lower extremities warm to touch. Neurologic: Non Focal.   Skin: No Rash.   Wounds: N/A.    Intake/Output from previous day:  Intake/Output Summary (Last 24 hours) at 08/12/14 1426 Last data filed at 08/12/14 1412  Gross per 24 hour  Intake    340 ml  Output   1350 ml  Net  -1010 ml     LAB RESULTS: CBC  Recent Labs Lab 08/06/14 2001 08/07/14 0530  WBC 6.1 5.9  HGB 11.6* 10.4*  HCT 35.8* 31.0*  PLT 163 148*  MCV 94.0 93.4  MCH 30.4 31.3  MCHC 32.4 33.5  RDW 14.5 14.5  LYMPHSABS 1.3 1.2  MONOABS 0.9 0.9  EOSABS 0.1 0.1  BASOSABS 0.0 0.0    Chemistries   Recent Labs Lab 08/06/14 2001 08/07/14 0530 08/09/14 0630 08/12/14 0510  NA 138 140 141 141  K 3.6 3.7 3.7 3.6  CL 105 104 107 109  CO2 '26 25 24 23  '$ GLUCOSE 117* 115* 104* 97  BUN 22* '17 14 17  '$ CREATININE 1.40* 1.25* 1.31* 1.25*  CALCIUM 9.4 9.0 9.1 8.8*    CBG:  Recent Labs Lab 08/11/14 1219 08/11/14 1655 08/12/14 0006 08/12/14 0524 08/12/14 1143  GLUCAP 131* 105* 105* 93 83    GFR Estimated Creatinine Clearance: 47.6 mL/min (by C-G formula based on Cr of 1.25).  Coagulation profile  Recent Labs Lab 08/07/14 0530  INR 1.04    Cardiac Enzymes No results for input(s): CKMB, TROPONINI, MYOGLOBIN in the last 168 hours.  Invalid input(s): CK  Invalid input(s): POCBNP No results for input(s): DDIMER in the last 72 hours. No results for input(s): HGBA1C in the last 72 hours. No results for input(s): CHOL, HDL, LDLCALC, TRIG, CHOLHDL, LDLDIRECT in the last 72 hours. No results for input(s): TSH, T4TOTAL, T3FREE, THYROIDAB in the last 72 hours.  Invalid input(s): FREET3 No results for input(s): VITAMINB12, FOLATE, FERRITIN, TIBC, IRON, RETICCTPCT in the last 72 hours. No results for input(s): LIPASE, AMYLASE in the last 72 hours.  Urine Studies No results for input(s): UHGB, CRYS in the last 72 hours.  Invalid input(s): UACOL, UAPR,  USPG, UPH, UTP, UGL, UKET, UBIL, UNIT, UROB, ULEU, UEPI, UWBC, URBC, UBAC, CAST, UCOM, BILUA  MICROBIOLOGY: No results found for this or any previous visit (from the past 240 hour(s)).  RADIOLOGY STUDIES/RESULTS: Ct Abdomen Pelvis Wo Contrast  08/06/2014   CLINICAL DATA:  Abdominal pain. Abdominal pain with onset of symptoms 1 week ago. History of non-small cell lung carcinoma on maintenance chemotherapy.  EXAM: CT CHEST, ABDOMEN AND PELVIS WITHOUT CONTRAST  TECHNIQUE: Multidetector CT imaging of the chest, abdomen and pelvis was performed following the standard protocol without IV contrast.  COMPARISON:  CT 05/14/2014.  CT 02/05/2014.  FINDINGS: CT CHEST FINDINGS  Musculoskeletal: No thoracic spine compression fractures. No aggressive osseous  lesions.  Lungs: Scarring is present extending to the RIGHT apex with dystrophic calcifications. LEFT pleural apical nodularity is chronic and compatible with scarring. Emphysema.  Central airways: Patent.  Vasculature: Atherosclerosis and coronary artery disease. RIGHT IJ Port-A-Cath. 42 mm ascending aorta.  Effusions: None.  Lymphadenopathy: There is no axillary adenopathy. The high RIGHT pretracheal nodal mass has enlarged and now measures 27 mm x 20 mm (image 19 series 2).  Esophagus: Grossly normal.  CT ABDOMEN AND PELVIS FINDINGS  Musculoskeletal: Lumbar vertebral body height is preserved. No aggressive osseous lesions identified.  Liver: Unenhanced CT was performed per clinician order. Lack of IV contrast limits sensitivity and specificity, especially for evaluation of abdominal/pelvic solid viscera.  There is a mass at the porta hepatis. It is unclear whether this arises from the porta hepatis nodes, the caudate lobe of the liver or the head of the pancreas. Favor a nodal mass or from the head of the pancreas (image 64 series 2). This demonstrates central low attenuation consistent with central necrosis. The mass measures 5 cm x 5 cm on axial imaging and this is  a new finding  Spleen:  Normal.  Gallbladder:  Contracted.  No calcified stones.  Common bile duct:  Obscured by a mass in the porta hepatis.  Pancreas: Body and tail the pancreas appear normal. The head and neck are difficult to evaluate because of mass and lack of IV contrast.  Adrenal glands:  Normal bilaterally.  Kidneys:  Within normal limits.  Stomach: Antrum and pylorus anteriorly displaced by the mass in the porta hepatis.  Small bowel:  No small bowel obstruction or inflammatory changes.  Colon:   Within normal limits.  Pelvic Genitourinary:  Normal bladder.  Peritoneum: No free fluid or free air.  Vascular/lymphatic: Tortuous aortoiliac system with dense atherosclerosis. No gross acute abnormality.  Body Wall: Tiny fat containing periumbilical hernia.  IMPRESSION: 1. Enlarging RIGHT paratracheal nodal mass now measuring 27 mm x 20 mm. This is compatible with malignant adenopathy in a patient with non-small cell lung carcinoma. 2. 42 mm ascending aorta. Recommend annual imaging followup by CTA or MRA. This recommendation follows 2010 ACCF/AHA/AATS/ACR/ASA/SCA/SCAI/SIR/STS/SVM Guidelines for the Diagnosis and Management of Patients with Thoracic Aortic Disease. Circulation. 2010; 121: P102-H852 3. New 5 cm x 5 cm abdominal mass centered in the porta hepatis, either arising from a node in the region or from the pancreatic head. This probably represents metastatic disease given the increased adenopathy in the chest.   Electronically Signed   By: Dereck Ligas M.D.   On: 08/06/2014 21:05   Ct Chest Wo Contrast  08/06/2014   CLINICAL DATA:  Abdominal pain. Abdominal pain with onset of symptoms 1 week ago. History of non-small cell lung carcinoma on maintenance chemotherapy.  EXAM: CT CHEST, ABDOMEN AND PELVIS WITHOUT CONTRAST  TECHNIQUE: Multidetector CT imaging of the chest, abdomen and pelvis was performed following the standard protocol without IV contrast.  COMPARISON:  CT 05/14/2014.  CT 02/05/2014.   FINDINGS: CT CHEST FINDINGS  Musculoskeletal: No thoracic spine compression fractures. No aggressive osseous lesions.  Lungs: Scarring is present extending to the RIGHT apex with dystrophic calcifications. LEFT pleural apical nodularity is chronic and compatible with scarring. Emphysema.  Central airways: Patent.  Vasculature: Atherosclerosis and coronary artery disease. RIGHT IJ Port-A-Cath. 42 mm ascending aorta.  Effusions: None.  Lymphadenopathy: There is no axillary adenopathy. The high RIGHT pretracheal nodal mass has enlarged and now measures 27 mm x 20 mm (image 19 series 2).  Esophagus: Grossly normal.  CT ABDOMEN AND PELVIS FINDINGS  Musculoskeletal: Lumbar vertebral body height is preserved. No aggressive osseous lesions identified.  Liver: Unenhanced CT was performed per clinician order. Lack of IV contrast limits sensitivity and specificity, especially for evaluation of abdominal/pelvic solid viscera.  There is a mass at the porta hepatis. It is unclear whether this arises from the porta hepatis nodes, the caudate lobe of the liver or the head of the pancreas. Favor a nodal mass or from the head of the pancreas (image 64 series 2). This demonstrates central low attenuation consistent with central necrosis. The mass measures 5 cm x 5 cm on axial imaging and this is a new finding  Spleen:  Normal.  Gallbladder:  Contracted.  No calcified stones.  Common bile duct:  Obscured by a mass in the porta hepatis.  Pancreas: Body and tail the pancreas appear normal. The head and neck are difficult to evaluate because of mass and lack of IV contrast.  Adrenal glands:  Normal bilaterally.  Kidneys:  Within normal limits.  Stomach: Antrum and pylorus anteriorly displaced by the mass in the porta hepatis.  Small bowel:  No small bowel obstruction or inflammatory changes.  Colon:   Within normal limits.  Pelvic Genitourinary:  Normal bladder.  Peritoneum: No free fluid or free air.  Vascular/lymphatic: Tortuous  aortoiliac system with dense atherosclerosis. No gross acute abnormality.  Body Wall: Tiny fat containing periumbilical hernia.  IMPRESSION: 1. Enlarging RIGHT paratracheal nodal mass now measuring 27 mm x 20 mm. This is compatible with malignant adenopathy in a patient with non-small cell lung carcinoma. 2. 42 mm ascending aorta. Recommend annual imaging followup by CTA or MRA. This recommendation follows 2010 ACCF/AHA/AATS/ACR/ASA/SCA/SCAI/SIR/STS/SVM Guidelines for the Diagnosis and Management of Patients with Thoracic Aortic Disease. Circulation. 2010; 121: F621-H086 3. New 5 cm x 5 cm abdominal mass centered in the porta hepatis, either arising from a node in the region or from the pancreatic head. This probably represents metastatic disease given the increased adenopathy in the chest.   Electronically Signed   By: Dereck Ligas M.D.   On: 08/06/2014 21:05   Mr Abdomen Mrcp Wo Cm  08/07/2014   CLINICAL DATA:  Evaluate abdominal mass  EXAM: MRI ABDOMEN WITHOUT CONTRAST  (INCLUDING MRCP)  TECHNIQUE: Multiplanar multisequence MR imaging of the abdomen was performed. Heavily T2-weighted images of the biliary and pancreatic ducts were obtained, and three-dimensional MRCP images were rendered by post processing.  COMPARISON:  08/06/2014  FINDINGS: Lower chest:  No pleural or pericardial effusion noted.  Hepatobiliary: No focal liver abnormality identified. There is intrahepatic bile duct dilatation. There is fusiform dilatation of the common bile duct which measures up to 11 mm, image 24/series 10. The gallbladder appears normal. There is a large round mass within the right upper quadrant of the abdomen situated between the descending duodenum and head of pancreas. This measures 4.8 x 4.8 x 5.1 cm. This has mass effect upon the distal common bile duct, portal vein at the venous confluence, IVC and descending duodenum.  Pancreas: There is no abnormal dilatation of the pancreatic duct. Body and tail of pancreas  appear normal.  Spleen: Negative.  Adrenals/Urinary Tract: The adrenal glands are both normal. Normal appearance of the right kidney. Cyst in the left kidney measures 1.4 cm.  Stomach/Bowel: Unremarkable appearance of the stomach. Right upper quadrant mass exerts mass effect upon the descending duodenum. The visualize jejunum and colon are unremarkable.  Vascular/Lymphatic: Calcified atherosclerotic plaque involves  the abdominal aorta. Multiple enlarged upper abdominal lymph nodes are identified. Index periaortic lymph node measures 1.3 cm, image 40/series 10.  Other: No free fluid or fluid collections identified within the upper abdomen.  Musculoskeletal: Normal signal from within the bone marrow.  IMPRESSION: 1. Large mass within the right upper quadrant of the abdomen is of on certain origin. This is situated between the head of pancreas and descending duodenum. This likely represents either a large nodal mass or lesion arising from the head of pancreas. The mass is partially obstructing the common bile duct and exerts mass effect upon the portal venous confluence, IVC, and descending duodenum. 2. Metastatic adenopathy noted within the left periaortic region.   Electronically Signed   By: Kerby Moors M.D.   On: 08/07/2014 09:58   Mr 3d Recon At Scanner  08/07/2014   CLINICAL DATA:  Evaluate abdominal mass  EXAM: MRI ABDOMEN WITHOUT CONTRAST  (INCLUDING MRCP)  TECHNIQUE: Multiplanar multisequence MR imaging of the abdomen was performed. Heavily T2-weighted images of the biliary and pancreatic ducts were obtained, and three-dimensional MRCP images were rendered by post processing.  COMPARISON:  08/06/2014  FINDINGS: Lower chest:  No pleural or pericardial effusion noted.  Hepatobiliary: No focal liver abnormality identified. There is intrahepatic bile duct dilatation. There is fusiform dilatation of the common bile duct which measures up to 11 mm, image 24/series 10. The gallbladder appears normal. There is a  large round mass within the right upper quadrant of the abdomen situated between the descending duodenum and head of pancreas. This measures 4.8 x 4.8 x 5.1 cm. This has mass effect upon the distal common bile duct, portal vein at the venous confluence, IVC and descending duodenum.  Pancreas: There is no abnormal dilatation of the pancreatic duct. Body and tail of pancreas appear normal.  Spleen: Negative.  Adrenals/Urinary Tract: The adrenal glands are both normal. Normal appearance of the right kidney. Cyst in the left kidney measures 1.4 cm.  Stomach/Bowel: Unremarkable appearance of the stomach. Right upper quadrant mass exerts mass effect upon the descending duodenum. The visualize jejunum and colon are unremarkable.  Vascular/Lymphatic: Calcified atherosclerotic plaque involves the abdominal aorta. Multiple enlarged upper abdominal lymph nodes are identified. Index periaortic lymph node measures 1.3 cm, image 40/series 10.  Other: No free fluid or fluid collections identified within the upper abdomen.  Musculoskeletal: Normal signal from within the bone marrow.  IMPRESSION: 1. Large mass within the right upper quadrant of the abdomen is of on certain origin. This is situated between the head of pancreas and descending duodenum. This likely represents either a large nodal mass or lesion arising from the head of pancreas. The mass is partially obstructing the common bile duct and exerts mass effect upon the portal venous confluence, IVC, and descending duodenum. 2. Metastatic adenopathy noted within the left periaortic region.   Electronically Signed   By: Kerby Moors M.D.   On: 08/07/2014 09:58   Dg Ercp Biliary & Pancreatic Ducts  08/09/2014   CLINICAL DATA:  Tumor obstructing the common bile duct.  EXAM: ERCP performed by Dr. Benson [place].  TECHNIQUE: Multiple spot images obtained with the fluoroscopic device and submitted for interpretation post-procedure.  FLUOROSCOPY TIME:  Fluoroscopy Time:  5 minutes 20  seconds  Number of Acquired Images:  8  COMPARISON:  MRI dated 08/07/2014 and CT scan dated 08/06/2014  FINDINGS: Images demonstrate contrast in the distal common bile duct. Twelve wire was passed to the area stricture. Contrast was injected after  balloon dilatation. The contrast appears to be in trans stated into the liver parenchyma. Delayed imaging demonstrates slight diffusion of the contrast into the liver parenchyma but there does not appear to be extravasation of the contrast from the liver.  IMPRESSION: Extravasated contrast in the liver parenchyma. No evidence of leakage into the peritoneal cavity.  These images were submitted for radiologic interpretation only. Please see the procedural report for the amount of contrast and the fluoroscopy time utilized.   Electronically Signed   By: Lorriane Shire M.D.   On: 08/09/2014 14:09   Ir Int Lianne Cure Biliary Drain With Cholangiogram  08/12/2014   INDICATION: Biliary obstruction secondary to peripancreatic mass. There was inability to place a biliary stent via ERCP and request has been made to perform a percutaneous cholangiogram and biliary drainage procedure.  EXAM: 1. ULTRASOUND AND FLUOROSCOPIC GUIDED PERCUTANEOUS TRANSHEPATIC CHOLANGIOGRAM  2. INTERNAL/EXTERNAL PERCUTANEOUS BILIARY DRAINAGE CATHETER PLACEMENT  COMPARISON:  MRI and MRCP study on 08/07/2014  MEDICATIONS: The patient had received a scheduled dose of IV Unasyn approximately 3 hours prior to the procedure. No additional antibiotics were administered for the procedure itself.  CONTRAST:  92m OMNIPAQUE IOHEXOL 300 MG/ML  SOLN  ANESTHESIA/SEDATION: 4.0 mg IV Versed and 75 mcg IV fentanyl.  Total Moderate Sedation Time  39 minutes.  FLUOROSCOPY TIME:  4 minutes and 42 seconds.  COMPLICATIONS: None.  TECHNIQUE: Informed written consent was obtained from [place] a discussion of the risks, benefits and alternatives to treatment. Questions regarding the procedure were encouraged and  answered. A timeout was performed prior to the initiation of the procedure.  The right upper abdominal quadrant was prepped and draped in the usual sterile fashion, and a sterile drape was applied covering the operative field. Maximum barrier sterile technique with sterile gowns and gloves were used for the procedure. A timeout was performed prior to the initiation of the procedure.  Ultrasound scanning of the right upper abdominal quadrant was performed to delineate the anatomy and avoid transgression of the gallbladder or the pleura. After the overlying soft tissues were anesthetized with 1% Lidocaine, a 22 gauge Chiba needle was advanced under ultrasound guidance into the right lobe of the liver. Access was performed of a right lobe bile duct and contrast injected. A cholangiogram was performed. Multiple images were saved. A guidewire was then advanced into the bile ducts. A transitional dilator was placed. Additional contrast was injected and further central cholangiogram performed of the common bile duct.  The transitional dilator was exchanged for a Kumpe catheter over a Benson wire. The catheter was further advanced over a wire through the common bile duct and into the duodenum. The percutaneous tract was dilated and a 10.2 [place] drainage catheter was advanced with coil ultimately locked within the duodenum.  Contrast was injected and a completion radiograph was obtained. The catheter was connected to a gravity drainage bag. The catheter was secured to the skin with a Prolene retention suture.  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 A trickle of contrast was noted to enter the distal duct and duodenum. The stricture was able to be traversed with a catheter and guidewire, allowing placement of an internal/external biliary drainage catheter. This was initially  connected to gravity bag drainage. Output and laboratory tests will be followed.  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 A 10 French percutaneous internal/external biliary drainage catheter was able to be placed across the stricture and was formed at the level of the duodenum. The catheter was attached to gravity bag drainage.   Electronically Signed   By: Aletta Edouard M.D.   On: 08/12/2014 12:55    [place], MD  [facility] Pager:[phone]  If 7PM-7AM, please contact night-coverage [email or web address] Password [email or web address] 08/12/2014, 2:26 PM   LOS: 6 days

## 2014-08-12 NOTE — Progress Notes (Signed)
Subjective: The patient is seen and examined today. He is feeling a little bit better but continues to be jaundice. 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 He also has percutaneous biliary drainage. He denied having any significant fever or chills. He has no nausea or vomiting.  Objective: Vital signs in last 24 hours: Temp:  [97.8 F (36.6 C)-98.9 F (37.2 C)] 98.3 F (36.8 C) (05/16 2055) Pulse Rate:  [61-90] 70 (05/16 2055) Resp:  [8-18] 16 (05/16 2055) BP: (110-157)/(62-87) 114/62 mmHg (05/16 2055) SpO2:  [96 %-100 %] 98 % (05/16 2055)  Intake/Output from previous day: 05/15 0701 - 05/16 0700 In: 580 [P.O.:480; IV Piggyback:100] Out: 1100 [Urine:1100] Intake/Output this shift:    General appearance: alert, cooperative, appears stated age and icteric Resp: clear to auscultation bilaterally Cardio: regular rate and rhythm, S1, S2 normal, no murmur, click, rub or gallop GI: soft, non-tender; bowel sounds normal; no masses,  no organomegaly Extremities: extremities normal, atraumatic, no cyanosis or edema  Lab Results:  No results for input(s): WBC, HGB, HCT, PLT in the last 72 hours. BMET  Recent Labs  08/12/14 0510  NA 141  K 3.6  CL 109  CO2 23  GLUCOSE 97  BUN 17  CREATININE 1.25*  CALCIUM 8.8*    Studies/Results: Ir Int Ext Biliary Drain With Cholangiogram  08/12/2014   INDICATION: Biliary obstruction secondary to peripancreatic mass. There was inability to place a biliary stent via ERCP and request has been made to perform a percutaneous cholangiogram and biliary drainage procedure.  EXAM: 1. ULTRASOUND AND FLUOROSCOPIC GUIDED PERCUTANEOUS TRANSHEPATIC CHOLANGIOGRAM  2. INTERNAL/EXTERNAL PERCUTANEOUS BILIARY DRAINAGE CATHETER PLACEMENT  COMPARISON:  MRI and MRCP study on 08/07/2014  MEDICATIONS: The patient had received a scheduled dose of IV Unasyn  approximately 3 hours prior to the procedure. No additional antibiotics were administered for the procedure itself.  CONTRAST:  55m OMNIPAQUE IOHEXOL 300 MG/ML  SOLN  ANESTHESIA/SEDATION: 4.0 mg IV Versed and 75 mcg IV fentanyl.  Total Moderate Sedation Time  39 minutes.  FLUOROSCOPY TIME:  4 minutes and 42 seconds.  COMPLICATIONS: None.  TECHNIQUE: Informed written consent was obtained from [place] discussion of the risks, benefits and alternatives to treatment. Questions regarding the procedure were encouraged and answered. A timeout was performed prior to the initiation of the procedure.  The right upper abdominal quadrant was prepped and draped in the usual sterile fashion, and a sterile drape was applied covering the operative field. Maximum barrier sterile technique with sterile gowns and gloves were used for the procedure. A timeout was performed prior to the initiation of the procedure.  Ultrasound scanning of the right upper abdominal quadrant was performed to delineate the anatomy and avoid transgression of the gallbladder or the pleura. After the overlying soft tissues were anesthetized with 1% Lidocaine, a 22 gauge Chiba needle was advanced under ultrasound guidance into the right lobe of the liver. Access was performed of a right lobe bile duct and contrast injected. A cholangiogram was performed. Multiple images were saved. A guidewire was then advanced into the bile ducts. A transitional dilator was placed. Additional contrast was injected and further central cholangiogram performed of the common bile duct.  The transitional dilator was exchanged for a Kumpe catheter over a Benson wire. The catheter was further advanced over a wire through the common bile duct and into the duodenum. The percutaneous tract was dilated and  a 10.2 [place] biliary drainage catheter was advanced with coil ultimately locked within the duodenum.  Contrast was injected and a completion radiograph was  obtained. The catheter was connected to a gravity drainage bag. The catheter was secured to the skin with a Prolene retention suture.  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 A trickle of contrast was noted to enter the distal duct and duodenum. The stricture was able to be traversed with a catheter and guidewire, allowing placement of an internal/external biliary drainage catheter. This was initially connected to gravity bag drainage. Output and laboratory tests will be followed.  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 A 10 French percutaneous internal/external biliary drainage catheter was able to be placed across the stricture and was formed at the level of the duodenum. The catheter was attached to gravity bag drainage.   Electronically Signed   By: Aletta Edouard M.D.   On: 08/12/2014 12:55    Medications: I have reviewed the patient's current medications.  CODE STATUS: No CODE BLUE  Assessment/Plan: This is a very pleasant 69 years old white male with metastatic non-small cell lung cancer, adenocarcinoma diagnosed in September 2010 status post induction chemotherapy followed by maintenance treatment for several years and this was discontinued more than a year ago secondary to intolerance and request of the patient. He presented with progressive disease in the chest as well as abdomen with a large abdominal mass located between the pancreatic head and duodenum with obstructive jaundice. The pathology was consistent with metastatic carcinoma questionable adenocarcinoma consistent with his previous diagnosis of the lung cancer. I had a lengthy discussion with the patient today about his condition. I would consider the  patient to start systemic chemotherapy eventually after improvement of his condition. He has an appointment scheduled with me in 2 weeks for reevaluation and discussion of this option. I also discussed with the patient palliative radiotherapy to the large metastatic abdominal mass for relief of the obstructive jaundice. I will place a consult for Dr. Pablo Ledger to evaluate the patient during his hospitalization for discussion of this option. Thank you for taking good care of Mr. Paolo. I will continue to follow up the patient with you and assist in his management an as-needed basis.     LOS: 6 days    Andree Golphin K. 08/12/2014

## 2014-08-12 NOTE — Procedures (Signed)
Procedure:  PTC and internal/external biliary drainage Findings:  Moderate biliary dilatation by ultrasound.  Peripheral access obtained in a right lobe bile duct.  Cholangiogram shows high grade stricture of mid to distal common bile duct. 10 Fr biliary drainage catheter placed and advanced beyond stricture into duodenum.  Attached to gravity bag. Will follow catheter output and bilirubin.

## 2014-08-13 ENCOUNTER — Ambulatory Visit
Admit: 2014-08-13 | Discharge: 2014-08-13 | Disposition: A | Discharge status: Home / Self Care | Payer: Medicare Other | Attending: Radiation Oncology | Admitting: Radiation Oncology

## 2014-08-13 ENCOUNTER — Ambulatory Visit
Admit: 2014-08-13 | Discharge: 2014-08-13 | Disposition: A | Discharge status: Home / Self Care | Payer: Medicare Other | Source: Ambulatory Visit | Attending: Radiation Oncology | Admitting: Radiation Oncology

## 2014-08-13 DIAGNOSIS — Z87891 Personal history of nicotine dependence: Secondary | ICD-10-CM | POA: Insufficient documentation

## 2014-08-13 DIAGNOSIS — R1900 Intra-abdominal and pelvic swelling, mass and lump, unspecified site: Secondary | ICD-10-CM | POA: Insufficient documentation

## 2014-08-13 DIAGNOSIS — C3492 Malignant neoplasm of unspecified part of left bronchus or lung: Secondary | ICD-10-CM | POA: Insufficient documentation

## 2014-08-13 LAB — GLUCOSE, CAPILLARY
Glucose-Capillary: 121 mg/dL — ABNORMAL HIGH (ref 65–99)
Glucose-Capillary: 124 mg/dL — ABNORMAL HIGH (ref 65–99)

## 2014-08-13 NOTE — Progress Notes (Signed)
PATIENT DETAILS Name: Abdulmalik Darco Age: 69 y.o. Sex: male Date of Birth: Nov 19, 1945 Admit Date: 08/06/2014 Admitting Physician Rise Patience, MD AJO:INOMVE, Annie Main, MD Interim summary: Mostyn Varnell is a 69 y.o. male with known history of metastatic non-small cell lung cancer came in for abdominal pain. Was found to have obstructive jaundice secondary to a mass around the porta hepatis. FNA Revealed poorly differentiated ca. Underwent percutaneous, transhepatic drain and plan for radiation oncology evaluation.  Subjective: Itching, still with scleral icterus. Pain is better controlled. Wants to know when he can go home.   Assessment/Plan:    Obstructive jaundice: Secondary to a mass around the porta hepatis-given history of non-small cell lung cancer-suspicion for metastatic disease.underwent ERCP with stenting on 5/13, and FNA samples taken show poorly differentiated cancer, underwent IR guided percutaneous transhepatic cholangiogram and drain placement on 5.16  Recheck cmp shows improvement in transminases but elevation in bilirubin. .  Active Problems:    History of non-small cell lung cancer: Previously was on several chemotherapy regimens-however for the past 15 months on just observation. Oncology consulted. Plan for IR guided  Percutaneous transhepatic cholangiogram and drain put in on 5/16. Radiation oncology consult and plan for radiation treatment.      Hypertension: Controlled, , would  continue with atenolol and Norvasc    Constipation: Add MiraLAX. Suspect from narcotic use. Improved.       Abdominal pain, generalized: Chronic issue-suspect secondary to malignancy-continue with as needed narcotics. Controlled with pain meds.     Chronic kidney disease stage III : Creatinine close to her usual baseline, much improved when compared to admission. Monitor periodically .    Malnutrition of moderate degree: Continue with supplements    Disposition: Remain inpatient-  Antimicrobial agents  See below  Anti-infectives    Start     Dose/Rate Route Frequency Ordered Stop   08/09/14 2000  ampicillin-sulbactam (UNASYN) 1.5 g in sodium chloride 0.9 % 50 mL IVPB     1.5 g 100 mL/hr over 30 Minutes Intravenous 3 times per day 08/09/14 1400     08/09/14 0945  ampicillin-sulbactam (UNASYN) 1.5 g in sodium chloride 0.9 % 50 mL IVPB  Status:  Discontinued     1.5 g 100 mL/hr over 30 Minutes Intravenous Every 6 hours 08/09/14 0944 08/09/14 1404   08/07/14 1000  ampicillin (PRINCIPEN) capsule 250 mg  Status:  Discontinued     250 mg Oral 4 times daily 08/07/14 0042 08/07/14 1551      DVT Prophylaxis: SCD's-resume pharmacological agents post ERCP   Code Status: Full code   Family Communication Spouse at bedside  Procedures: ERCP  CONSULTS:  GI and hematology/oncology  Time spent 20 minutes-Greater than 50% of this time was spent in counseling, explanation of diagnosis, planning of further management, and coordination of care.  MEDICATIONS: Scheduled Meds: . amLODipine  2.5 mg Oral q morning - 10a  . ampicillin-sulbactam (UNASYN) IV  1.5 g Intravenous 3 times per day  . atenolol  100 mg Oral q morning - 10a  . mirabegron ER  50 mg Oral Daily  . multivitamin with minerals   Oral Daily  . pantoprazole  40 mg Oral Daily  . polyethylene glycol  17 g Oral BID   Continuous Infusions:   PRN Meds:.diphenhydrAMINE, HYDROmorphone (DILAUDID) injection, lactulose, methocarbamol, ondansetron **OR** ondansetron (ZOFRAN) IV    PHYSICAL EXAM: Vital signs in last 24 hours: Filed Vitals:  08/12/14 1411 08/12/14 2055 08/13/14 0518 08/13/14 1451  BP: 121/69 114/62 115/70 132/82  Pulse: 62 70 68 68  Temp: 97.8 F (36.6 C) 98.3 F (36.8 C) 97.9 F (36.6 C) 98.4 F (36.9 C)  TempSrc: Axillary Oral Oral Oral  Resp: '16 16 16 18  '$ Height:      Weight:      SpO2: 96% 98% 100% 96%    Weight change:  Filed Weights    08/06/14 2247 08/09/14 0931  Weight: 60.4 kg (133 lb 2.5 oz) 60.328 kg (133 lb)   Body mass index is 20.23 kg/(m^2).   Gen Exam: Awake and alert with clear speech.  Scleral icterus Neck: Supple, No JVD.   Chest: B/L Clear.   CVS: S1 S2 Regular, no murmurs.  Abdomen: soft, BS +, tender int he right upper quadrant, non distended.  Extremities: no edema, lower extremities warm to touch. Neurologic: Non Focal.   Skin: No Rash.   Wounds: N/A.    Intake/Output from previous day:  Intake/Output Summary (Last 24 hours) at 08/13/14 1833 Last data filed at 08/13/14 1803  Gross per 24 hour  Intake    240 ml  Output   1175 ml  Net   -935 ml     LAB RESULTS: CBC  Recent Labs Lab 08/06/14 2001 08/07/14 0530  WBC 6.1 5.9  HGB 11.6* 10.4*  HCT 35.8* 31.0*  PLT 163 148*  MCV 94.0 93.4  MCH 30.4 31.3  MCHC 32.4 33.5  RDW 14.5 14.5  LYMPHSABS 1.3 1.2  MONOABS 0.9 0.9  EOSABS 0.1 0.1  BASOSABS 0.0 0.0    Chemistries   Recent Labs Lab 08/06/14 2001 08/07/14 0530 08/09/14 0630 08/12/14 0510  NA 138 140 141 141  K 3.6 3.7 3.7 3.6  CL 105 104 107 109  CO2 '26 25 24 23  '$ GLUCOSE 117* 115* 104* 97  BUN 22* '17 14 17  '$ CREATININE 1.40* 1.25* 1.31* 1.25*  CALCIUM 9.4 9.0 9.1 8.8*    CBG:  Recent Labs Lab 08/12/14 1143 08/12/14 1704 08/12/14 2341 08/13/14 1150 08/13/14 1703  GLUCAP 83 109* 97 121* 124*    GFR Estimated Creatinine Clearance: 47.6 mL/min (by C-G formula based on Cr of 1.25).  Coagulation profile  Recent Labs Lab 08/07/14 0530  INR 1.04    Cardiac Enzymes No results for input(s): CKMB, TROPONINI, MYOGLOBIN in the last 168 hours.  Invalid input(s): CK  Invalid input(s): POCBNP No results for input(s): DDIMER in the last 72 hours. No results for input(s): HGBA1C in the last 72 hours. No results for input(s): CHOL, HDL, LDLCALC, TRIG, CHOLHDL, LDLDIRECT in the last 72 hours. No results for input(s): TSH, T4TOTAL, T3FREE, THYROIDAB in the last  72 hours.  Invalid input(s): FREET3 No results for input(s): VITAMINB12, FOLATE, FERRITIN, TIBC, IRON, RETICCTPCT in the last 72 hours. No results for input(s): LIPASE, AMYLASE in the last 72 hours.  Urine Studies No results for input(s): UHGB, CRYS in the last 72 hours.  Invalid input(s): UACOL, UAPR, USPG, UPH, UTP, UGL, UKET, UBIL, UNIT, UROB, ULEU, UEPI, UWBC, URBC, UBAC, CAST, UCOM, BILUA  MICROBIOLOGY: No results found for this or any previous visit (from the past 240 hour(s)).  RADIOLOGY STUDIES/RESULTS: Ct Abdomen Pelvis Wo Contrast  08/06/2014   CLINICAL DATA:  Abdominal pain. Abdominal pain with onset of symptoms 1 week ago. History of non-small cell lung carcinoma on maintenance chemotherapy.  EXAM: CT CHEST, ABDOMEN AND PELVIS WITHOUT CONTRAST  TECHNIQUE: Multidetector CT imaging of  the chest, abdomen and pelvis was performed following the standard protocol without IV contrast.  COMPARISON:  CT 05/14/2014.  CT 02/05/2014.  FINDINGS: CT CHEST FINDINGS  Musculoskeletal: No thoracic spine compression fractures. No aggressive osseous lesions.  Lungs: Scarring is present extending to the RIGHT apex with dystrophic calcifications. LEFT pleural apical nodularity is chronic and compatible with scarring. Emphysema.  Central airways: Patent.  Vasculature: Atherosclerosis and coronary artery disease. RIGHT IJ Port-A-Cath. 42 mm ascending aorta.  Effusions: None.  Lymphadenopathy: There is no axillary adenopathy. The high RIGHT pretracheal nodal mass has enlarged and now measures 27 mm x 20 mm (image 19 series 2).  Esophagus: Grossly normal.  CT ABDOMEN AND PELVIS FINDINGS  Musculoskeletal: Lumbar vertebral body height is preserved. No aggressive osseous lesions identified.  Liver: Unenhanced CT was performed per clinician order. Lack of IV contrast limits sensitivity and specificity, especially for evaluation of abdominal/pelvic solid viscera.  There is a mass at the porta hepatis. It is unclear  whether this arises from the porta hepatis nodes, the caudate lobe of the liver or the head of the pancreas. Favor a nodal mass or from the head of the pancreas (image 64 series 2). This demonstrates central low attenuation consistent with central necrosis. The mass measures 5 cm x 5 cm on axial imaging and this is a new finding  Spleen:  Normal.  Gallbladder:  Contracted.  No calcified stones.  Common bile duct:  Obscured by a mass in the porta hepatis.  Pancreas: Body and tail the pancreas appear normal. The head and neck are difficult to evaluate because of mass and lack of IV contrast.  Adrenal glands:  Normal bilaterally.  Kidneys:  Within normal limits.  Stomach: Antrum and pylorus anteriorly displaced by the mass in the porta hepatis.  Small bowel:  No small bowel obstruction or inflammatory changes.  Colon:   Within normal limits.  Pelvic Genitourinary:  Normal bladder.  Peritoneum: No free fluid or free air.  Vascular/lymphatic: Tortuous aortoiliac system with dense atherosclerosis. No gross acute abnormality.  Body Wall: Tiny fat containing periumbilical hernia.  IMPRESSION: 1. Enlarging RIGHT paratracheal nodal mass now measuring 27 mm x 20 mm. This is compatible with malignant adenopathy in a patient with non-small cell lung carcinoma. 2. 42 mm ascending aorta. Recommend annual imaging followup by CTA or MRA. This recommendation follows 2010 ACCF/AHA/AATS/ACR/ASA/SCA/SCAI/SIR/STS/SVM Guidelines for the Diagnosis and Management of Patients with Thoracic Aortic Disease. Circulation. 2010; 121: D622-W979 3. New 5 cm x 5 cm abdominal mass centered in the porta hepatis, either arising from a node in the region or from the pancreatic head. This probably represents metastatic disease given the increased adenopathy in the chest.   Electronically Signed   By: Dereck Ligas M.D.   On: 08/06/2014 21:05   Ct Chest Wo Contrast  08/06/2014   CLINICAL DATA:  Abdominal pain. Abdominal pain with onset of symptoms 1  week ago. History of non-small cell lung carcinoma on maintenance chemotherapy.  EXAM: CT CHEST, ABDOMEN AND PELVIS WITHOUT CONTRAST  TECHNIQUE: Multidetector CT imaging of the chest, abdomen and pelvis was performed following the standard protocol without IV contrast.  COMPARISON:  CT 05/14/2014.  CT 02/05/2014.  FINDINGS: CT CHEST FINDINGS  Musculoskeletal: No thoracic spine compression fractures. No aggressive osseous lesions.  Lungs: Scarring is present extending to the RIGHT apex with dystrophic calcifications. LEFT pleural apical nodularity is chronic and compatible with scarring. Emphysema.  Central airways: Patent.  Vasculature: Atherosclerosis and coronary artery disease. RIGHT  IJ Port-A-Cath. 42 mm ascending aorta.  Effusions: None.  Lymphadenopathy: There is no axillary adenopathy. The high RIGHT pretracheal nodal mass has enlarged and now measures 27 mm x 20 mm (image 19 series 2).  Esophagus: Grossly normal.  CT ABDOMEN AND PELVIS FINDINGS  Musculoskeletal: Lumbar vertebral body height is preserved. No aggressive osseous lesions identified.  Liver: Unenhanced CT was performed per clinician order. Lack of IV contrast limits sensitivity and specificity, especially for evaluation of abdominal/pelvic solid viscera.  There is a mass at the porta hepatis. It is unclear whether this arises from the porta hepatis nodes, the caudate lobe of the liver or the head of the pancreas. Favor a nodal mass or from the head of the pancreas (image 64 series 2). This demonstrates central low attenuation consistent with central necrosis. The mass measures 5 cm x 5 cm on axial imaging and this is a new finding  Spleen:  Normal.  Gallbladder:  Contracted.  No calcified stones.  Common bile duct:  Obscured by a mass in the porta hepatis.  Pancreas: Body and tail the pancreas appear normal. The head and neck are difficult to evaluate because of mass and lack of IV contrast.  Adrenal glands:  Normal bilaterally.  Kidneys:  Within  normal limits.  Stomach: Antrum and pylorus anteriorly displaced by the mass in the porta hepatis.  Small bowel:  No small bowel obstruction or inflammatory changes.  Colon:   Within normal limits.  Pelvic Genitourinary:  Normal bladder.  Peritoneum: No free fluid or free air.  Vascular/lymphatic: Tortuous aortoiliac system with dense atherosclerosis. No gross acute abnormality.  Body Wall: Tiny fat containing periumbilical hernia.  IMPRESSION: 1. Enlarging RIGHT paratracheal nodal mass now measuring 27 mm x 20 mm. This is compatible with malignant adenopathy in a patient with non-small cell lung carcinoma. 2. 42 mm ascending aorta. Recommend annual imaging followup by CTA or MRA. This recommendation follows 2010 ACCF/AHA/AATS/ACR/ASA/SCA/SCAI/SIR/STS/SVM Guidelines for the Diagnosis and Management of Patients with Thoracic Aortic Disease. Circulation. 2010; 121: O270-J500 3. New 5 cm x 5 cm abdominal mass centered in the porta hepatis, either arising from a node in the region or from the pancreatic head. This probably represents metastatic disease given the increased adenopathy in the chest.   Electronically Signed   By: Dereck Ligas M.D.   On: 08/06/2014 21:05   Mr Abdomen Mrcp Wo Cm  08/07/2014   CLINICAL DATA:  Evaluate abdominal mass  EXAM: MRI ABDOMEN WITHOUT CONTRAST  (INCLUDING MRCP)  TECHNIQUE: Multiplanar multisequence MR imaging of the abdomen was performed. Heavily T2-weighted images of the biliary and pancreatic ducts were obtained, and three-dimensional MRCP images were rendered by post processing.  COMPARISON:  08/06/2014  FINDINGS: Lower chest:  No pleural or pericardial effusion noted.  Hepatobiliary: No focal liver abnormality identified. There is intrahepatic bile duct dilatation. There is fusiform dilatation of the common bile duct which measures up to 11 mm, image 24/series 10. The gallbladder appears normal. There is a large round mass within the right upper quadrant of the abdomen  situated between the descending duodenum and head of pancreas. This measures 4.8 x 4.8 x 5.1 cm. This has mass effect upon the distal common bile duct, portal vein at the venous confluence, IVC and descending duodenum.  Pancreas: There is no abnormal dilatation of the pancreatic duct. Body and tail of pancreas appear normal.  Spleen: Negative.  Adrenals/Urinary Tract: The adrenal glands are both normal. Normal appearance of the right kidney. Cyst in  the left kidney measures 1.4 cm.  Stomach/Bowel: Unremarkable appearance of the stomach. Right upper quadrant mass exerts mass effect upon the descending duodenum. The visualize jejunum and colon are unremarkable.  Vascular/Lymphatic: Calcified atherosclerotic plaque involves the abdominal aorta. Multiple enlarged upper abdominal lymph nodes are identified. Index periaortic lymph node measures 1.3 cm, image 40/series 10.  Other: No free fluid or fluid collections identified within the upper abdomen.  Musculoskeletal: Normal signal from within the bone marrow.  IMPRESSION: 1. Large mass within the right upper quadrant of the abdomen is of on certain origin. This is situated between the head of pancreas and descending duodenum. This likely represents either a large nodal mass or lesion arising from the head of pancreas. The mass is partially obstructing the common bile duct and exerts mass effect upon the portal venous confluence, IVC, and descending duodenum. 2. Metastatic adenopathy noted within the left periaortic region.   Electronically Signed   By: Kerby Moors M.D.   On: 08/07/2014 09:58   Mr 3d Recon At Scanner  08/07/2014   CLINICAL DATA:  Evaluate abdominal mass  EXAM: MRI ABDOMEN WITHOUT CONTRAST  (INCLUDING MRCP)  TECHNIQUE: Multiplanar multisequence MR imaging of the abdomen was performed. Heavily T2-weighted images of the biliary and pancreatic ducts were obtained, and three-dimensional MRCP images were rendered by post processing.  COMPARISON:   08/06/2014  FINDINGS: Lower chest:  No pleural or pericardial effusion noted.  Hepatobiliary: No focal liver abnormality identified. There is intrahepatic bile duct dilatation. There is fusiform dilatation of the common bile duct which measures up to 11 mm, image 24/series 10. The gallbladder appears normal. There is a large round mass within the right upper quadrant of the abdomen situated between the descending duodenum and head of pancreas. This measures 4.8 x 4.8 x 5.1 cm. This has mass effect upon the distal common bile duct, portal vein at the venous confluence, IVC and descending duodenum.  Pancreas: There is no abnormal dilatation of the pancreatic duct. Body and tail of pancreas appear normal.  Spleen: Negative.  Adrenals/Urinary Tract: The adrenal glands are both normal. Normal appearance of the right kidney. Cyst in the left kidney measures 1.4 cm.  Stomach/Bowel: Unremarkable appearance of the stomach. Right upper quadrant mass exerts mass effect upon the descending duodenum. The visualize jejunum and colon are unremarkable.  Vascular/Lymphatic: Calcified atherosclerotic plaque involves the abdominal aorta. Multiple enlarged upper abdominal lymph nodes are identified. Index periaortic lymph node measures 1.3 cm, image 40/series 10.  Other: No free fluid or fluid collections identified within the upper abdomen.  Musculoskeletal: Normal signal from within the bone marrow.  IMPRESSION: 1. Large mass within the right upper quadrant of the abdomen is of on certain origin. This is situated between the head of pancreas and descending duodenum. This likely represents either a large nodal mass or lesion arising from the head of pancreas. The mass is partially obstructing the common bile duct and exerts mass effect upon the portal venous confluence, IVC, and descending duodenum. 2. Metastatic adenopathy noted within the left periaortic region.   Electronically Signed   By: Kerby Moors M.D.   On: 08/07/2014  09:58   Dg Ercp Biliary & Pancreatic Ducts  08/09/2014   CLINICAL DATA:  Tumor obstructing the common bile duct.  EXAM: ERCP performed by Dr. Benson [place].  TECHNIQUE: Multiple spot images obtained with the fluoroscopic device and submitted for interpretation post-procedure.  FLUOROSCOPY TIME:  Fluoroscopy Time:  5 minutes 20 seconds  Number of Acquired  Images:  8  COMPARISON:  MRI dated 08/07/2014 and CT scan dated 08/06/2014  FINDINGS: Images demonstrate contrast in the distal common bile duct. Twelve wire was passed to the area stricture. Contrast was injected after balloon dilatation. The contrast appears to be in trans stated into the liver parenchyma. Delayed imaging demonstrates slight diffusion of the contrast into the liver parenchyma but there does not appear to be extravasation of the contrast from the liver.  IMPRESSION: Extravasated contrast in the liver parenchyma. No evidence of leakage into the peritoneal cavity.  These images were submitted for radiologic interpretation only. Please see the procedural report for the amount of contrast and the fluoroscopy time utilized.   Electronically Signed   By: Lorriane Shire M.D.   On: 08/09/2014 14:09   Ir Int Lianne Cure Biliary Drain With Cholangiogram  08/12/2014   INDICATION: Biliary obstruction secondary to peripancreatic mass. There was inability to place a biliary stent via ERCP and request has been made to perform a percutaneous cholangiogram and biliary drainage procedure.  EXAM: 1. ULTRASOUND AND FLUOROSCOPIC GUIDED PERCUTANEOUS TRANSHEPATIC CHOLANGIOGRAM  2. INTERNAL/EXTERNAL PERCUTANEOUS BILIARY DRAINAGE CATHETER PLACEMENT  COMPARISON:  MRI and MRCP study on 08/07/2014  MEDICATIONS: The patient had received a scheduled dose of IV Unasyn approximately 3 hours prior to the procedure. No additional antibiotics were administered for the procedure itself.  CONTRAST:  67m OMNIPAQUE IOHEXOL 300 MG/ML  SOLN  ANESTHESIA/SEDATION: 4.0 mg IV Versed and 75 mcg IV  fentanyl.  Total Moderate Sedation Time  39 minutes.  FLUOROSCOPY TIME:  4 minutes and 42 seconds.  COMPLICATIONS: None.  TECHNIQUE: Informed written consent was obtained from [place] of the risks, benefits and alternatives to treatment. Questions regarding the procedure were encouraged and answered. A timeout was performed prior to the initiation of the procedure.  The right upper abdominal quadrant was prepped and draped in the usual sterile fashion, and a sterile drape was applied covering the operative field. Maximum barrier sterile technique with sterile gowns and gloves were used for the procedure. A timeout was performed prior to the initiation of the procedure.  Ultrasound scanning of the right upper abdominal quadrant was performed to delineate the anatomy and avoid transgression of the gallbladder or the pleura. After the overlying soft tissues were anesthetized with 1% Lidocaine, a 22 gauge Chiba needle was advanced under ultrasound guidance into the right lobe of the liver. Access was performed of a right lobe bile duct and contrast injected. A cholangiogram was performed. Multiple images were saved. A guidewire was then advanced into the bile ducts. A transitional dilator was placed. Additional contrast was injected and further central cholangiogram performed of the common bile duct.  The transitional dilator was exchanged for a Kumpe catheter over a Benson wire. The catheter was further advanced over a wire through the common bile duct and into the duodenum. The percutaneous tract was dilated and a 10.2 [place] drainage catheter was advanced with coil ultimately locked within the duodenum.  Contrast was injected and a completion radiograph was obtained. The catheter was connected to a gravity drainage bag. The catheter was secured to the skin with a Prolene retention suture.  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 A trickle of contrast was noted to enter the distal duct and duodenum. The stricture was able to be traversed with a catheter and guidewire,  allowing placement of an internal/external biliary drainage catheter. This was initially connected to gravity bag drainage. Output and laboratory tests will be followed.  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 A 10 French percutaneous internal/external biliary drainage catheter was able to be placed across the stricture and was formed at the level of the duodenum. The catheter was attached to gravity bag drainage.   Electronically Signed   By: Aletta Edouard M.D.   On: 08/12/2014 12:55    [place], MD  [facility] Pager:[phone]  If 7PM-7AM, please contact night-coverage [email or web address] Password [facility] 08/13/2014, 6:33 PM   LOS: 7 days

## 2014-08-13 NOTE — Progress Notes (Signed)
Patient ID: [name], male   DOB: 1945-05-28, 69 y.o.   MRN: [id number]    Referring Physician(s): Benson Norway  Subjective: Pt doing ok; jaundice persists; does have some epigastric discomfort, occ nausea, no vomiting; has had breakfast   Allergies: Augmentin; Carboplatin; Levaquin; Percocet; and Zolpidem tartrate  Medications: Prior to Admission medications   Medication Sig Start Date End Date Taking? Authorizing Provider  amLODipine (NORVASC) 2.5 MG tablet Take 2.5 mg by mouth every morning.  01/13/11  Yes Historical Provider, MD  ampicillin (PRINCIPEN) 250 MG capsule Take 250 mg by mouth 4 (four) times daily.  07/30/14  Yes Historical Provider, MD  Ascorbic Acid (VITAMIN C) 1000 MG tablet Take 1,000 mg by mouth daily.   Yes Historical Provider, MD  atenolol (TENORMIN) 100 MG tablet Take 100 mg by mouth every morning.    Yes Historical Provider, MD  B Complex-Biotin-FA (B-COMPLEX PO) Take 1 tablet by mouth daily. 158m daily   Yes Historical Provider, MD  FeFum-FePoly-FA-B Cmp-C-Biot (FOLIVANE-PLUS) CAPS Take 1 capsule by mouth daily. 11/13/13  Yes MCurt Bears MD  HYDROmorphone (DILAUDID) 2 MG tablet Take 1 tablet (2 mg total) by mouth every 3 (three) hours as needed for severe pain (Q4-6 hours PRN). 06/06/14  Yes Amber CCecilio Asper PA-C  methocarbamol (ROBAXIN) 500 MG tablet Take 1 tablet (500 mg total) by mouth every 6 (six) hours as needed for muscle spasms. 06/06/14  Yes Amber CCecilio Asper PA-C  Multiple Vitamin (MULTIVITAMIN) tablet Take 1 tablet by mouth daily.    Yes Historical Provider, MD  MYRBETRIQ 50 MG TB24 tablet Take 50 mg by mouth daily. 01/09/14  Yes Historical Provider, MD  omeprazole (PRILOSEC) 40 MG capsule Take 1 capsule (40 mg total) by mouth every evening. 11/13/13  Yes MCurt Bears MD  Sennosides-Docusate Sodium (SENOKOT S PO) Take 1 tablet by mouth daily. 2 tabs daily   Yes Historical Provider, MD     Vital Signs: BP 115/70 mmHg  Pulse 68  Temp(Src) 97.9 F  (36.6 C) (Oral)  Resp 16  Ht 5' 8"  (1.727 m)  Wt 133 lb (60.328 kg)  BMI 20.23 kg/m2  SpO2 100%  Physical Exam awake/alert; Biliary drain intact, insertion site ok, mildly tender; output 200 cc's today; cx's pend  Imaging: Dg Ercp Biliary & Pancreatic Ducts  08/09/2014   CLINICAL DATA:  Tumor obstructing the common bile duct.  EXAM: ERCP performed by Dr. HBenson Norway  TECHNIQUE: Multiple spot images obtained with the fluoroscopic device and submitted for interpretation post-procedure.  FLUOROSCOPY TIME:  Fluoroscopy Time:  5 minutes 20 seconds  Number of Acquired Images:  8  COMPARISON:  MRI dated 08/07/2014 and CT scan dated 08/06/2014  FINDINGS: Images demonstrate contrast in the distal common bile duct. Twelve wire was passed to the area stricture. Contrast was injected after balloon dilatation. The contrast appears to be in trans stated into the liver parenchyma. Delayed imaging demonstrates slight diffusion of the contrast into the liver parenchyma but there does not appear to be extravasation of the contrast from the liver.  IMPRESSION: Extravasated contrast in the liver parenchyma. No evidence of leakage into the peritoneal cavity.  These images were submitted for radiologic interpretation only. Please see the procedural report for the amount of contrast and the fluoroscopy time utilized.   Electronically Signed   By: JLorriane ShireM.D.   On: 08/09/2014 14:09   Ir Int ELianne CureBiliary Drain With Cholangiogram  08/12/2014   INDICATION: Biliary obstruction secondary to peripancreatic mass. There was inability to  place a biliary stent via ERCP and request has been made to perform a percutaneous cholangiogram and biliary drainage procedure.  EXAM: 1. ULTRASOUND AND FLUOROSCOPIC GUIDED PERCUTANEOUS TRANSHEPATIC CHOLANGIOGRAM  2. INTERNAL/EXTERNAL PERCUTANEOUS BILIARY DRAINAGE CATHETER PLACEMENT  COMPARISON:  MRI and MRCP study on 08/07/2014  MEDICATIONS: The patient had received a scheduled dose of IV Unasyn  approximately 3 hours prior to the procedure. No additional antibiotics were administered for the procedure itself.  CONTRAST:  63m OMNIPAQUE IOHEXOL 300 MG/ML  SOLN  ANESTHESIA/SEDATION: 4.0 mg IV Versed and 75 mcg IV fentanyl.  Total Moderate Sedation Time  39 minutes.  FLUOROSCOPY TIME:  4 minutes and 42 seconds.  COMPLICATIONS: None.  TECHNIQUE: Informed written consent was obtained from BHardyafter a discussion of the risks, benefits and alternatives to treatment. Questions regarding the procedure were encouraged and answered. A timeout was performed prior to the initiation of the procedure.  The right upper abdominal quadrant was prepped and draped in the usual sterile fashion, and a sterile drape was applied covering the operative field. Maximum barrier sterile technique with sterile gowns and gloves were used for the procedure. A timeout was performed prior to the initiation of the procedure.  Ultrasound scanning of the right upper abdominal quadrant was performed to delineate the anatomy and avoid transgression of the gallbladder or the pleura. After the overlying soft tissues were anesthetized with 1% Lidocaine, a 22 gauge Chiba needle was advanced under ultrasound guidance into the right lobe of the liver. Access was performed of a right lobe bile duct and contrast injected. A cholangiogram was performed. Multiple images were saved. A guidewire was then advanced into the bile ducts. A transitional dilator was placed. Additional contrast was injected and further central cholangiogram performed of the common bile duct.  The transitional dilator was exchanged for a Kumpe catheter over a Benson wire. The catheter was further advanced over a wire through the common bile duct and into the duodenum. The percutaneous tract was dilated and a 10.2 FPak[place]biliary drainage catheter was advanced with coil ultimately locked within the duodenum.  Contrast was injected and a completion radiograph was  obtained. The catheter was connected to a gravity drainage bag. The catheter was secured to the skin with a Prolene retention suture.  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 A trickle of contrast was noted to enter the distal duct and duodenum. The stricture was able to be traversed with a catheter and guidewire, allowing placement of an internal/external biliary drainage catheter. This was initially connected to gravity bag drainage. Output and laboratory tests will be followed.  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 A 10 French percutaneous internal/external biliary drainage catheter was able to be placed across the stricture and was formed at the level of the duodenum. The catheter was attached to gravity bag drainage.   Electronically Signed   By: GAletta EdouardM.D.   On: 08/12/2014 12:55    Labs:  CBC:  Recent Labs  05/14/14 1021 07/02/14 1036 08/06/14 2001 08/07/14 0530  WBC 6.9 8.2 6.1 5.9  HGB 11.7* 11.5* 11.6* 10.4*  HCT 34.9* 34.9* 35.8* 31.0*  PLT 159 161 163 148*    COAGS:  Recent Labs  06/03/14 1335 08/07/14 0530  INR 1.17 1.04  APTT 37  --     BMP:  Recent Labs  08/06/14 2001 08/07/14 0530 08/09/14 0630 08/12/14 0510  NA 138 140 141 141  K 3.6 3.7 3.7 3.6  CL 105 104 107 109  CO2 26 25 24 23   GLUCOSE 117* 115* 104* 97  BUN 22* 17 14 17   CALCIUM 9.4 9.0 9.1 8.8*  CREATININE 1.40* 1.25* 1.31* 1.25*  GFRNONAA 50* 57* 54* 57*  GFRAA 58* >60 >60 >60    LIVER FUNCTION TESTS:  Recent Labs  08/06/14 2001 08/07/14 0530 08/09/14 0630 08/12/14 0510  BILITOT 5.0* 5.6* 6.8* 9.0*  AST 178* 153* 127* 96*  ALT 520* 425* 321* 178*  ALKPHOS 749* 667* 671* 615*  PROT 7.5 6.6 6.9  6.5  ALBUMIN 3.7 3.2* 3.3* 2.8*    Assessment and Plan:  S/p PTC with I/E biliary drain 5/16 (distal CBD stricture/peripancreatic mass- poorly diff ca); afebrile; t bili still elevated at 9, other LFT's sl lower; drain irrigated this am; creat 1.25; hydrate, cont to monitor lytes/LFT's/CBC; other plans as per GI/Onc/IM  Signed: D. Rowe Robert 08/13/2014, 9:48 AM   I spent a total of 15 minutes  in clinical consultation/evaluation, greater than 50% of which was counseling/coordinating care for biliary drain

## 2014-08-13 NOTE — Progress Notes (Signed)
  Radiation Oncology         [phone]) [phone] ________________________________  Initial Inpatient Consultation - Date: 08/13/2014   Name: [name] MRN: [id number]   DOB: 1945/10/13  REFERRING PHYSICIAN: Hosie Poisson, MD  DIAGNOSIS AND STAGE: Bronchogenic cancer of left lung   Staging form: Lung, AJCC 7th Edition     Clinical: Metastatic non-small cell lung cancer, adenocarcinoma with negative EGFR mutation and negative ALK gene translocation - Signed by [name], MD on 05/15/2013     Pathologic: No stage assigned - Unsigned  HISTORY OF PRESENT ILLNESS::[name] is a 69 y.o. male  Who I know from 6 years ago when we completed 30 Gy in 12 fractions to his mediastinum.  He has done remarkably well since that time until recently when he developed jaundice.  He was found to have a porta hepatic mass. ON CT this measured 5 cm and had central necrosis. The common bile duct was dilated on MR. The mass appeared to be a porta hepatic lymph node. ERCP with stent placement was attempted but the stent could not be placed.  An external drain was placed to provide symptomatic relief and he is feeling better. I was asked to consult for consideration of radiation in the management of his disease.   PREVIOUS RADIATION THERAPY: Yes Right chest/mediastinum  Past medical, social and family history were reviewed in the electronic chart. Review of symptoms was reviewed in the electronic chart. Medications were reviewed in the electronic chart.   PHYSICAL EXAM: There were no vitals filed for this visit.. . Jaundice. Alert and oriented. No abdominal pain. 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 Risks and benefits discussed with patient and family. Simulation today. Start next week. Hope is to decompress duct for stent placement or permanently. Consent signed.    I spent 30 minutes  face to face  with the patient and more than 50% of that time was spent in counseling and/or coordination of care.   ------------------------------------------------  Thea Silversmith, MD

## 2014-08-13 NOTE — Progress Notes (Signed)
[facility] Simulation and Treatment Planning Note   Name: [name] MRN: [id number]  Date: 08/13/2014  DOB: Oct 03, 1945  Status: outpatient    DIAGNOSIS: Bronchogenic cancer of left lung   Staging form: Lung, AJCC 7th Edition     Clinical: Metastatic non-small cell lung cancer, adenocarcinoma with negative EGFR mutation and negative ALK gene translocation - Signed by [name], MD on 05/15/2013     Pathologic: No stage assigned - Unsigned   CONSENT VERIFIED: yes   SET UP AND IMMOBILIZATION: Patient is setup supine with arms in a wing board and legs in a vacloc.  NARRATIVE: The patient was brought to the [facility].  Identity was confirmed.  All relevant records and images related to the planned course of therapy were reviewed.  Then, the patient was positioned in a stable reproducible clinical set-up for radiation therapy.  CT images were obtained.  Skin markings were placed.  The CT images were loaded into the planning software where the target and avoidance structures were contoured.  The radiation prescription was entered and confirmed.   TREATMENT PLANNING NOTE:  Treatment planning then occurred. I have requested 3D simulation with [facility] of the spinal cord, total lungs and gross tumor volume. I have also requested mlcs and an isodose plan.   A total of 5 complex treatment devices will be utilized in the form of MLCs and the vacloc

## 2014-08-13 NOTE — Care Management Note (Signed)
Case Management Note  Patient Details  Name: Danon Lograsso MRN: [id number] Date of Birth: 04-21-1945    Expected Discharge Date:                  Expected Discharge Plan:  Home/Self Care  In-House Referral:     Discharge planning Services  CM Consult  Post Acute Care Choice:    Choice offered to:     DME Arranged:    DME Agency:     HH Arranged:    [place] Agency:     Status of Service:  In process, will continue to follow  Medicare Important Message Given:    Date Medicare IM Given:  08/13/14 Medicare IM give by:  Marney Doctor RN,BSN,NCM Date Additional Medicare IM Given:    Additional Medicare Important Message give by:     If discussed at [place] of Stay Meetings, dates discussed:    Additional Comments: Pt had drain placed on 08/12/14. This CM spoke to pt at bedside to evaluate for [facility] needs. Pt states that he does not need a HHRN to take care of the drain, that he could handle it himself at home. No other HH needs identified.  Lynnell Catalan, RN 08/13/2014, 10:42 AM

## 2014-08-14 ENCOUNTER — Other Ambulatory Visit: Payer: No Typology Code available for payment source

## 2014-08-14 ENCOUNTER — Ambulatory Visit (HOSPITAL_COMMUNITY): Payer: Medicare Other

## 2014-08-14 DIAGNOSIS — E440 Moderate protein-calorie malnutrition: Secondary | ICD-10-CM

## 2014-08-14 LAB — CBC
HCT: 26.6 % — ABNORMAL LOW (ref 39.0–52.0)
HEMATOCRIT: 25.6 % — AB (ref 39.0–52.0)
Hemoglobin: 8.7 g/dL — ABNORMAL LOW (ref 13.0–17.0)
Hemoglobin: 8.4 g/dL — ABNORMAL LOW (ref 13.0–17.0)
MCH: 30.5 pg (ref 26.0–34.0)
MCH: 30.7 pg (ref 26.0–34.0)
MCHC: 32.7 g/dL (ref 30.0–36.0)
MCHC: 32.8 g/dL (ref 30.0–36.0)
MCV: 93.3 fL (ref 78.0–100.0)
MCV: 93.4 fL (ref 78.0–100.0)
PLATELETS: 138 10*3/uL — AB (ref 150–400)
Platelets: 141 10*3/uL — ABNORMAL LOW (ref 150–400)
RBC: 2.85 MIL/uL — AB (ref 4.22–5.81)
RBC: 2.74 MIL/uL — ABNORMAL LOW (ref 4.22–5.81)
RDW: 16.2 % — ABNORMAL HIGH (ref 11.5–15.5)
RDW: 16.4 % — ABNORMAL HIGH (ref 11.5–15.5)
WBC: 7.7 10*3/uL (ref 4.0–10.5)
WBC: 6.4 10*3/uL (ref 4.0–10.5)

## 2014-08-14 LAB — GLUCOSE, CAPILLARY
GLUCOSE-CAPILLARY: 103 mg/dL — AB (ref 65–99)
Glucose-Capillary: 137 mg/dL — ABNORMAL HIGH (ref 65–99)
Glucose-Capillary: 119 mg/dL — ABNORMAL HIGH (ref 65–99)
Glucose-Capillary: 128 mg/dL — ABNORMAL HIGH (ref 65–99)

## 2014-08-14 LAB — COMPREHENSIVE METABOLIC PANEL
ALT: 131 U/L — ABNORMAL HIGH (ref 17–63)
ANION GAP: 5 (ref 5–15)
AST: 64 U/L — ABNORMAL HIGH (ref 15–41)
Albumin: 2.9 g/dL — ABNORMAL LOW (ref 3.5–5.0)
Alkaline Phosphatase: 536 U/L — ABNORMAL HIGH (ref 38–126)
BUN: 22 mg/dL — AB (ref 6–20)
CALCIUM: 8.8 mg/dL — AB (ref 8.9–10.3)
CO2: 22 mmol/L (ref 22–32)
Chloride: 113 mmol/L — ABNORMAL HIGH (ref 101–111)
Creatinine, Ser: 1.35 mg/dL — ABNORMAL HIGH (ref 0.61–1.24)
GFR calc Af Amer: >60 mL/min (ref >60)
GFR calc non Af Amer: 52 mL/min — ABNORMAL LOW (ref >60)
Glucose, Bld: 121 mg/dL — ABNORMAL HIGH (ref 65–99)
POTASSIUM: 3.6 mmol/L (ref 3.5–5.1)
SODIUM: 140 mmol/L (ref 135–145)
TOTAL PROTEIN: 6.6 g/dL (ref 6.5–8.1)
Total Bilirubin: 8.6 mg/dL — ABNORMAL HIGH (ref 0.3–1.2)

## 2014-08-14 MED ORDER — HYDROMORPHONE HCL 2 MG PO TABS
2.0000 mg | ORAL_TABLET | ORAL | Status: DC | PRN
  Administered 2014-08-14 – 2014-08-15 (×2): 2 mg via ORAL
  Filled 2014-08-14 (×2): qty 1

## 2014-08-14 NOTE — Progress Notes (Addendum)
PROGRESS NOTE    [name] YOV:[id number] DOB: 1945-08-30 DOA: 08/06/2014 PCP: [name], MD  HPI/Brief narrative 69 y.o. male with known history of metastatic non-small cell lung cancer came in for abdominal pain. Was found to have obstructive jaundice secondary to a mass around the porta hepatis. FNA Revealed poorly differentiated ca. Underwent percutaneous, transhepatic drain and plan for radiation oncology evaluation.    Assessment/Plan:  Obstructive jaundice secondary to malignant mass around the porta hepatis - Failed ERCP and unable to place stent 5/13.  - FNA showed poorly differentiated cancer  - Status post percutaneous transhepatic cholangiogram and drain placement 5/16  - LFTs gradually improving but patient still with substantial amount of pain  - Radiation oncology consultation appreciated: for palliative RT to large metastatic abdominal mass for relief of obstructive jaundice  - Patient was started on Unasyn perry ERCP attempt on 5/13: Will discuss with GI regarding discontinuing same.  Metastatic non-small cell lung cancer, adenocarcinoma  - Medical oncologist input appreciated: Will consider systemic chemotherapy eventually after improvement of his condition.  - Patient has follow-up appointment with oncology in 2 weeks.   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 - Stable  Thrombocytopenia - Stable  Malnutrition of moderate degree - Continue nutritional supplements   Code Status: Full Family Communication: None at bedside Disposition Plan: Charge home possibly 5/19 pending adequate pain control   Consultants:  GI  Medical and radiation oncology  Interventional radiology    Procedures:  Failed ERCP and stent  placement  Percutaneous biliary drain  Antibiotics:  Ampicillin 1 dose on 5/11  Unasyn 5/13 >   Subjective: Intermittent right upper quadrant pain rated at 6-7/10 in severity. No nausea or vomiting.   Objective: Filed Vitals:   08/13/14 1451 08/13/14 2127 08/14/14 0518 08/14/14 1415  BP: 132/82 127/73 122/71 115/76  Pulse: 68 70 70 67  Temp: 98.4 F (36.9 C) 98.5 F (36.9 C) 97.9 F (36.6 C) 97.9 F (36.6 C)  TempSrc: Oral Oral Oral Oral  Resp: '18 18 18 18  '$ Height:      Weight:      SpO2: 96% 97% 96% 97%    Intake/Output Summary (Last 24 hours) at 08/14/14 1704 Last data filed at 08/14/14 1416  Gross per 24 hour  Intake    580 ml  Output   2525 ml  Net  -1945 ml   Filed Weights   08/06/14 2247 08/09/14 0931  Weight: 60.4 kg (133 lb 2.5 oz) 60.328 kg (133 lb)     Exam:  General exam: Greatly built and nourished middle-aged male lying comfortably in bed Respiratory system: Clear. No increased work of breathing. Cardiovascular system: S1 & S2 heard, RRR. No JVD, murmurs, gallops, clicks or pedal edema. Gastrointestinal system: Abdomen is nondistended, soft. Mild RUQ & epigastric tenderness without peritoneal signs. Normal bowel sounds heard. right percutaneous biliary drain in place-draining bilious liquids Central nervous system: Alert and oriented. No focal neurological deficits. Extremities: Symmetric 5 x 5 power.   Data Reviewed: Basic Metabolic Panel:  Recent Labs Lab 08/09/14 0630 08/12/14 0510 08/14/14 0520  NA 141 141 140  K 3.7 3.6 3.6  CL 107 109 113*  CO2 '24 23 22  '$ GLUCOSE 104* 97 121*  BUN 14 17 22*  CREATININE 1.31* 1.25* 1.35*  CALCIUM 9.1 8.8* 8.8*   Liver Function Tests:  Recent Labs Lab 08/09/14 0630 08/12/14 0510 08/14/14 0520  AST 127* 96* 64*  ALT 321* 178* 131*  ALKPHOS 671* 615* 536*  BILITOT 6.8* 9.0* 8.6*  PROT 6.9 6.5 6.6  ALBUMIN 3.3* 2.8* 2.9*   No results for input(s): LIPASE, AMYLASE in the last 168  hours. No results for input(s): AMMONIA in the last 168 hours. CBC:  Recent Labs Lab 08/14/14 0520 08/14/14 1250  WBC 7.7 6.4  HGB 8.7* 8.4*  HCT 26.6* 25.6*  MCV 93.3 93.4  PLT 141* 138*   Cardiac Enzymes: No results for input(s): CKTOTAL, CKMB, CKMBINDEX, TROPONINI in the last 168 hours. BNP (last 3 results) No results for input(s): PROBNP in the last 8760 hours. CBG:  Recent Labs Lab 08/13/14 1150 08/13/14 1703 08/13/14 2311 08/14/14 0515 08/14/14 1204  GLUCAP 121* 124* 137* 119* 128*    No results found for this or any previous visit (from the past 240 hour(s)).        Studies: No results found.      Scheduled Meds: . amLODipine  2.5 mg Oral q morning - 10a  . ampicillin-sulbactam (UNASYN) IV  1.5 g Intravenous 3 times per day  . atenolol  100 mg Oral q morning - 10a  . mirabegron ER  50 mg Oral Daily  . multivitamin with minerals   Oral Daily  . pantoprazole  40 mg Oral Daily  . polyethylene glycol  17 g Oral BID   Continuous Infusions:   Principal Problem:   Obstructive jaundice due to cancer Active Problems:   Bronchogenic cancer of left lung   Hypertension   Abdominal pain, generalized   CRF (chronic renal failure)   Obstructive jaundice   Malnutrition of moderate degree   Abdominal mass    Time spent: 20 minutes.    Vernell Leep, MD, FACP, FHM. [facility] Pager [phone]  If 7PM-7AM, please contact night-coverage [email or web address] Password [email or web address] 08/14/2014, 5:04 PM    LOS: 8 days

## 2014-08-14 NOTE — Progress Notes (Signed)
Patient ID: [name], male   DOB: [birth year], 69 y.o.   MRN: [id number]     Referring Physician(s): Dr. Mayme Genta  Subjective:  Pt doing well today.  Feels pretty good.  States he feels he could probably go home tomorrow, only concerned about how to care for drain.  Allergies: Augmentin; Carboplatin; Levaquin; Percocet; and Zolpidem tartrate  Medications: Prior to Admission medications   Medication Sig Start Date End Date Taking? Authorizing Provider  amLODipine (NORVASC) 2.5 MG tablet Take 2.5 mg by mouth every morning.  01/13/11  Yes Historical Provider, MD  ampicillin (PRINCIPEN) 250 MG capsule Take 250 mg by mouth 4 (four) times daily.  07/30/14  Yes Historical Provider, MD  Ascorbic Acid (VITAMIN C) 1000 MG tablet Take 1,000 mg by mouth daily.   Yes Historical Provider, MD  atenolol (TENORMIN) 100 MG tablet Take 100 mg by mouth every morning.    Yes Historical Provider, MD  B Complex-Biotin-FA (B-COMPLEX PO) Take 1 tablet by mouth daily. 154m daily   Yes Historical Provider, MD  FeFum-FePoly-FA-B Cmp-C-Biot (FOLIVANE-PLUS) CAPS Take 1 capsule by mouth daily. 11/13/13  Yes MCurt Bears MD  HYDROmorphone (DILAUDID) 2 MG tablet Take 1 tablet (2 mg total) by mouth every 3 (three) hours as needed for severe pain (Q4-6 hours PRN). 06/06/14  Yes Amber CCecilio Asper PA-C  methocarbamol (ROBAXIN) 500 MG tablet Take 1 tablet (500 mg total) by mouth every 6 (six) hours as needed for muscle spasms. 06/06/14  Yes Amber CCecilio Asper PA-C  Multiple Vitamin (MULTIVITAMIN) tablet Take 1 tablet by mouth daily.    Yes Historical Provider, MD  MYRBETRIQ 50 MG TB24 tablet Take 50 mg by mouth daily. 01/09/14  Yes Historical Provider, MD  omeprazole (PRILOSEC) 40 MG capsule Take 1 capsule (40 mg total) by mouth every evening. 11/13/13  Yes MCurt Bears MD  Sennosides-Docusate Sodium (SENOKOT S PO) Take 1 tablet by mouth daily. 2 tabs daily   Yes Historical Provider, MD     Vital Signs: BP  122/71 mmHg  Pulse 70  Temp(Src) 97.9 F (36.6 C) (Oral)  Resp 18  Ht 5' 8"  (1.727 m)  Wt 133 lb (60.328 kg)  BMI 20.23 kg/m2  SpO2 96%  Physical Exam   A & O x 3 Abdomen soft, ND. Mild tenderness at drain entrance and epig. Area. Drain in place, no erythema at entrance site. Output ~ 875 mls bilious.  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 There was inability to place a biliary stent via ERCP and request has been made to perform a percutaneous cholangiogram and biliary drainage procedure.  EXAM: 1. ULTRASOUND AND FLUOROSCOPIC GUIDED PERCUTANEOUS TRANSHEPATIC CHOLANGIOGRAM  2. INTERNAL/EXTERNAL PERCUTANEOUS BILIARY DRAINAGE CATHETER PLACEMENT  COMPARISON:  MRI and MRCP study on 08/07/2014  MEDICATIONS: The patient had received a scheduled dose of IV Unasyn approximately 3 hours prior to the procedure. No additional antibiotics were administered for the procedure itself.  CONTRAST:  275mOMNIPAQUE IOHEXOL 300 MG/ML  SOLN  ANESTHESIA/SEDATION: 4.0 mg IV Versed and 75 mcg IV fentanyl.  Total Moderate Sedation Time  39 minutes.  FLUOROSCOPY TIME:  4 minutes and 42 seconds.  COMPLICATIONS: None.  TECHNIQUE: Informed written consent was obtained from [place] a discussion of the risks, benefits and alternatives to treatment. Questions regarding the procedure were encouraged and answered. A timeout was performed prior to the initiation of the procedure.  The right upper abdominal quadrant was  prepped and draped in the usual sterile fashion, and a sterile drape was applied covering the operative field. Maximum barrier sterile technique with sterile gowns and gloves were used for the procedure. A timeout was performed prior to the initiation of the procedure.  Ultrasound scanning of the right upper abdominal quadrant was performed to delineate the anatomy and avoid transgression of the gallbladder or  the pleura. After the overlying soft tissues were anesthetized with 1% Lidocaine, a 22 gauge Chiba needle was advanced under ultrasound guidance into the right lobe of the liver. Access was performed of a right lobe bile duct and contrast injected. A cholangiogram was performed. Multiple images were saved. A guidewire was then advanced into the bile ducts. A transitional dilator was placed. Additional contrast was injected and further central cholangiogram performed of the common bile duct.  The transitional dilator was exchanged for a Kumpe catheter over a Benson wire. The catheter was further advanced over a wire through the common bile duct and into the duodenum. The percutaneous tract was dilated and a 10.2 [place] biliary drainage catheter was advanced with coil ultimately locked within the duodenum.  Contrast was injected and a completion radiograph was obtained. The catheter was connected to a gravity drainage bag. The catheter was secured to the skin with a Prolene retention suture.  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 A trickle of contrast was noted to enter the distal duct and duodenum. The stricture was able to be traversed with a catheter and guidewire, allowing placement of an internal/external biliary drainage catheter. This was initially connected to gravity bag drainage. Output and laboratory tests will be followed.  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 A 10 French percutaneous internal/external biliary drainage catheter was able to be placed across the stricture and was formed at the level of the duodenum. The catheter was attached to gravity bag drainage.   Electronically Signed   By: Aletta Edouard M.D.   On:  08/12/2014 12:55    Labs:  CBC:  Recent Labs  07/02/14 1036 08/06/14 2001 08/07/14 0530 08/14/14 0520  WBC 8.2 6.1 5.9 7.7  HGB 11.5* 11.6* 10.4* 8.7*  HCT 34.9* 35.8* 31.0* 26.6*  PLT 161 163 148* 141*    COAGS:  Recent Labs  06/03/14 1335 08/07/14 0530  INR 1.17 1.04  APTT 37  --     BMP:  Recent Labs  08/07/14 0530 08/09/14 0630 08/12/14 0510 08/14/14 0520  NA 140 141 141 140  K 3.7 3.7 3.6 3.6  CL 104 107 109 113*  CO2 25 24 23 22   GLUCOSE 115* 104* 97 121*  BUN 17 14 17  22*  CALCIUM 9.0 9.1 8.8* 8.8*  CREATININE 1.25* 1.31* 1.25* 1.35*  GFRNONAA 57* 54* 57* 52*  GFRAA >60 >60 >60 >60    LIVER FUNCTION TESTS:  Recent Labs  08/07/14 0530 08/09/14 0630 08/12/14 0510 08/14/14 0520  BILITOT 5.6* 6.8* 9.0* 8.6*  AST 153* 127* 96* 64*  ALT 425* 321* 178* 131*  ALKPHOS 667* 671* 615* 536*  PROT 6.6 6.9 6.5 6.6  ALBUMIN 3.2* 3.3* 2.8* 2.9*    Assessment and Plan: S/p PTC with I/E biliary drain 5/16 (distal CBD stricture/peripancreatic mass- poorly diff ca) Afebrile Tbili still elevated at 8.6, other LFT's  lower Will need to f/u with Dr. [name] at D/C.  Will need to f/u with IR in about 2 weeks (after visit with Dr. [name]) and discuss possible  Need for stent. Nursing to instruct patient how to care for drain at home.  SignedNevin Bloodgood 08/14/2014, 10:22 AM   I spent a total of 15 minutes in face to face in clinical follow up of PTC w/ I/E Bil Drain placed 5/16.

## 2014-08-14 NOTE — Progress Notes (Signed)
Nutrition Follow-up  DOCUMENTATION CODES:  Non-severe (moderate) malnutrition in context of chronic illness  INTERVENTION:  Provide Carnation Instant Breakfast BID- each supplement provides 220 kcal and 13 g of protein. Provide Magic cup TID with meals, each supplement provides 290 kcal and 9 grams of protein  Encourage PO intake RD to continue to monitor  NUTRITION DIAGNOSIS:  Malnutrition related to chronic illness as evidenced by energy intake < or equal to 75% for > or equal to 1 month, mild depletion of muscle mass.  ongoing  GOAL:  Patient will meet greater than or equal to 90% of their needs  improved  MONITOR:  PO intake, Supplement acceptance, Labs, Weight trends, Skin, I & O's  REASON FOR ASSESSMENT:  Malnutrition Screening Tool    ASSESSMENT: 69 y.o. male with known history of metastatic non-small cell lung cancer presently under observation has been experiencing abdominal pain over the last 1 week. Patient also has been having poor appetite with at least 10 pounds weight loss over the last 1 month.  PTA pt was eating less d/t poor appetite and abdominal pain. Pt was able to tolerate eggs, pudding, milk, ice cream.  Pt with 7 lb weight loss over the last 2 months (5% weight loss x 2 months, insignificant for time frame).  Pt says that he has not been receiving nutritional supplements consistently. He does consume them when he receives them. Spoke with kitchen who will make sure supplements are sent with trays and between meals.   Pt with 100% po intake at meals. RD will continue to monitor.   Height:  Ht Readings from Last 1 Encounters:  08/09/14 '5\' 8"'$  (1.727 m)    Weight:  Wt Readings from Last 1 Encounters:  08/09/14 133 lb (60.328 kg)    Ideal Body Weight:  70 kg  Wt Readings from Last 10 Encounters:  08/09/14 133 lb (60.328 kg)  06/03/14 140 lb (63.504 kg)  05/21/14 140 lb 1.6 oz (63.549 kg)  04/23/14 138 lb 9.6 oz (62.869 kg)  02/12/14  135 lb 3.2 oz (61.326 kg)  11/13/13 140 lb 9.6 oz (63.776 kg)  08/07/13 137 lb 1.6 oz (62.188 kg)  07/02/13 140 lb 6.4 oz (63.685 kg)  06/05/13 135 lb 6.4 oz (61.417 kg)  06/01/13 138 lb 12.8 oz (62.959 kg)    BMI:  Body mass index is 20.23 kg/(m^2).  Estimated Nutritional Needs:  Kcal:  1600-1800  Protein:  75-85g  Fluid:  1.6L/day  Skin:  Reviewed, no issues  Diet Order:  Diet regular Room service appropriate?: Yes; Fluid consistency:: Thin  EDUCATION NEEDS:  No education needs identified at this time   Intake/Output Summary (Last 24 hours) at 08/14/14 1311 Last data filed at 08/14/14 1028  Gross per 24 hour  Intake    100 ml  Output   2175 ml  Net  -2075 ml    Last BM:  Prior to admission  [place], [place], [place]

## 2014-08-15 DIAGNOSIS — R100 Acute abdomen: Secondary | ICD-10-CM

## 2014-08-15 DIAGNOSIS — R109 Unspecified abdominal pain: Secondary | ICD-10-CM | POA: Insufficient documentation

## 2014-08-15 LAB — COMPREHENSIVE METABOLIC PANEL
ALBUMIN: 3 g/dL — AB (ref 3.5–5.0)
ALT: 116 U/L — ABNORMAL HIGH (ref 17–63)
ANION GAP: 14 (ref 5–15)
AST: 65 U/L — ABNORMAL HIGH (ref 15–41)
Alkaline Phosphatase: 486 U/L — ABNORMAL HIGH (ref 38–126)
BUN: 21 mg/dL — AB (ref 6–20)
CHLORIDE: 108 mmol/L (ref 101–111)
CO2: 22 mmol/L (ref 22–32)
Calcium: 9.4 mg/dL (ref 8.9–10.3)
Creatinine, Ser: 1.22 mg/dL (ref 0.61–1.24)
GFR calc non Af Amer: 59 mL/min — ABNORMAL LOW (ref >60)
GLUCOSE: 105 mg/dL — AB (ref 65–99)
POTASSIUM: 3.5 mmol/L (ref 3.5–5.1)
Sodium: 144 mmol/L (ref 135–145)
TOTAL PROTEIN: 7 g/dL (ref 6.5–8.1)
Total Bilirubin: 9 mg/dL — ABNORMAL HIGH (ref 0.3–1.2)

## 2014-08-15 MED ORDER — MUPIROCIN CALCIUM 2 % EX CREA
1.0000 "application " | TOPICAL_CREAM | Freq: Two times a day (BID) | CUTANEOUS | Status: DC
  Administered 2014-08-15 – 2014-08-16 (×2): 1 via TOPICAL
  Filled 2014-08-15: qty 15

## 2014-08-15 MED ORDER — HEPARIN SODIUM (PORCINE) 5000 UNIT/ML IJ SOLN
5000.0000 [IU] | Freq: Three times a day (TID) | INTRAMUSCULAR | Status: DC
  Administered 2014-08-15 – 2014-08-16 (×2): 5000 [IU] via SUBCUTANEOUS
  Filled 2014-08-15 (×5): qty 1

## 2014-08-15 MED ORDER — MORPHINE SULFATE ER 15 MG PO TBCR
15.0000 mg | EXTENDED_RELEASE_TABLET | Freq: Two times a day (BID) | ORAL | Status: DC
  Administered 2014-08-15 – 2014-08-16 (×2): 15 mg via ORAL
  Filled 2014-08-15 (×2): qty 1

## 2014-08-15 MED ORDER — HYDROMORPHONE HCL 4 MG PO TABS
4.0000 mg | ORAL_TABLET | ORAL | Status: DC | PRN
  Administered 2014-08-15 – 2014-08-16 (×3): 4 mg via ORAL
  Filled 2014-08-15 (×3): qty 1

## 2014-08-15 MED ORDER — HYDROMORPHONE HCL 2 MG PO TABS: 2.0000 mg | ORAL_TABLET | ORAL | Status: AC

## 2014-08-15 NOTE — Progress Notes (Signed)
PROGRESS NOTE    [name] VAN:[id number] DOB: 01/13/1946 DOA: 08/06/2014 PCP: [name], MD  HPI/Brief narrative 68 y.o. male with known history of metastatic non-small cell lung cancer came in for abdominal pain. Was found to have obstructive jaundice secondary to a mass around the porta hepatis. FNA Revealed poorly differentiated ca. Underwent percutaneous, transhepatic drain and plan for radiation oncology evaluation.    Assessment/Plan:  Obstructive jaundice secondary to malignant mass around the porta hepatis - Failed ERCP and unable to place stent 5/13.  - FNA showed poorly differentiated cancer  - Status post percutaneous transhepatic cholangiogram and drain placement 5/16  - LFTs gradually improving but patient still with substantial amount of pain - started MS Contin 5/20  - Radiation oncology consultation appreciated: for palliative RT to large metastatic abdominal mass for relief of obstructive jaundice  - Patient was started on Unasyn peri ERCP attempt on 5/13: Discussed with GI/Dr. Roxana Hires to DC Unasyn and no need for outpatient GI follow-up.  Metastatic non-small cell lung cancer, adenocarcinoma  - Medical oncologist input appreciated: Will consider systemic chemotherapy eventually after improvement of his condition.  - Patient has follow-up appointment with oncology in 2 weeks.   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 - Stable  Thrombocytopenia - Stable  Malnutrition of moderate degree - Continue nutritional supplements   Code Status: Full Family Communication: Discussed with spouse at bedside Disposition Plan: DC home when pain better controlled-possibly 5/20   Consultants:  GI  Medical and radiation  oncology  Interventional radiology    Procedures:  Failed ERCP and stent placement  Percutaneous biliary drain  Antibiotics:  Ampicillin 1 dose on 5/11  Unasyn 5/13 > 5/19  Subjective: Complains of significant RUQ/epigastric abdominal pain rated at 7/10, unable to sleep at night, associated with nausea but no vomiting, decreased appetite. Had BM today.  Objective: Filed Vitals:   08/14/14 1415 08/14/14 2058 08/15/14 0510 08/15/14 1347  BP: 115/76 121/71 142/66 114/62  Pulse: 67 73 69 70  Temp: 97.9 F (36.6 C) 98.6 F (37 C) 97.9 F (36.6 C) 98.4 F (36.9 C)  TempSrc: Oral Oral Oral Oral  Resp: '18 16 17 16  '$ Height:      Weight:      SpO2: 97% 98% 97% 99%    Intake/Output Summary (Last 24 hours) at 08/15/14 1637 Last data filed at 08/15/14 1348  Gross per 24 hour  Intake    440 ml  Output   2150 ml  Net  -1710 ml   Filed Weights   08/06/14 2247 08/09/14 0931  Weight: 60.4 kg (133 lb 2.5 oz) 60.328 kg (133 lb)     Exam:  General exam: moderately built and nourished middle-aged male lying comfortably in bed Respiratory system: Clear. No increased work of breathing. Cardiovascular system: S1 & S2 heard, RRR. No JVD, murmurs, gallops, clicks or pedal edema. Gastrointestinal system: Abdomen is nondistended, soft. Mild RUQ & epigastric tenderness without peritoneal signs. Normal bowel sounds heard. right percutaneous biliary drain in place-draining bilious liquids. Minimal erythema around the drain site entering skin Central nervous system: Alert and oriented. No focal neurological deficits. Extremities: Symmetric 5 x 5 power.   Data Reviewed: Basic Metabolic Panel:  Recent Labs Lab 08/09/14 0630 08/12/14 0510 08/14/14 0520 08/15/14 0514  NA 141  141 140 144  K 3.7 3.6 3.6 3.5  CL 107 109 113* 108  CO2 '24 23 22 22  '$ GLUCOSE 104* 97 121* 105*  BUN 14 17 22* 21*  CREATININE 1.31* 1.25* 1.35* 1.22  CALCIUM 9.1 8.8* 8.8* 9.4   Liver Function  Tests:  Recent Labs Lab 08/09/14 0630 08/12/14 0510 08/14/14 0520 08/15/14 0514  AST 127* 96* 64* 65*  ALT 321* 178* 131* 116*  ALKPHOS 671* 615* 536* 486*  BILITOT 6.8* 9.0* 8.6* 9.0*  PROT 6.9 6.5 6.6 7.0  ALBUMIN 3.3* 2.8* 2.9* 3.0*   No results for input(s): LIPASE, AMYLASE in the last 168 hours. No results for input(s): AMMONIA in the last 168 hours. CBC:  Recent Labs Lab 08/14/14 0520 08/14/14 1250  WBC 7.7 6.4  HGB 8.7* 8.4*  HCT 26.6* 25.6*  MCV 93.3 93.4  PLT 141* 138*   Cardiac Enzymes: No results for input(s): CKTOTAL, CKMB, CKMBINDEX, TROPONINI in the last 168 hours. BNP (last 3 results) No results for input(s): PROBNP in the last 8760 hours. CBG:  Recent Labs Lab 08/13/14 1703 08/13/14 2311 08/14/14 0515 08/14/14 1204 08/14/14 1736  GLUCAP 124* 137* 119* 128* 103*    No results found for this or any previous visit (from the past 240 hour(s)).        Studies: No results found.      Scheduled Meds: . amLODipine  2.5 mg Oral q morning - 10a  . ampicillin-sulbactam (UNASYN) IV  1.5 g Intravenous 3 times per day  . atenolol  100 mg Oral q morning - 10a  . HYDROmorphone  2 mg Oral NOW  . mirabegron ER  50 mg Oral Daily  . morphine  15 mg Oral Q12H  . multivitamin with minerals   Oral Daily  . pantoprazole  40 mg Oral Daily  . polyethylene glycol  17 g Oral BID   Continuous Infusions:   Principal Problem:   Obstructive jaundice due to cancer Active Problems:   Bronchogenic cancer of left lung   Hypertension   Abdominal pain, generalized   CRF (chronic renal failure)   Obstructive jaundice   Malnutrition of moderate degree   Abdominal mass    Time spent: 20 minutes.    Vernell Leep, MD, FACP, FHM. [facility] Pager [phone]  If 7PM-7AM, please contact night-coverage [email or web address] Password [facility] 08/15/2014, 4:37 PM    LOS: 9 days

## 2014-08-15 NOTE — Care Management Note (Signed)
Case Management Note  Patient Details  Name: Kevyn Boquet MRN: [id number] Date of Birth: 04-08-1945     Expected Discharge Date:                  Expected Discharge Plan:  [place]  In-House Referral:     Discharge planning Services  CM Consult  Post Acute Care Choice:  Home Health Choice offered to:  Patient  DME Arranged:    DME Agency:     HH Arranged:  RN [place] Agency:  [place]  Status of Service:  In process, will continue to follow  Medicare Important Message Given:    Date Medicare IM Given:  08/13/14 Medicare IM give by:  Marney Doctor RN,BSN,NCM Date Additional Medicare IM Given:    Additional Medicare Important Message give by:     If discussed at [place] of Stay Meetings, dates discussed:    Additional Comments: Pt to DC home with percutaneous drain. Nursing staff to teach pt how to flush, change dressing and empty/record drainage prior to DC. Pt offered choice for [facility]HR[facility]. Pt chose [facility]. [facility] rep called for referral. Pt appreciative of CM assistance. Lynnell Catalan, RN 08/15/2014, 3:44 PM

## 2014-08-15 NOTE — Progress Notes (Signed)
Referring Physician(s): GI  Subjective: Patient with continued RUQ sharp pain, jaundice persists, denies any fever or chills.   Allergies: Augmentin; Carboplatin; Levaquin; Percocet; and Zolpidem tartrate  Medications: Prior to Admission medications   Medication Sig Start Date End Date Taking? Authorizing Provider  amLODipine (NORVASC) 2.5 MG tablet Take 2.5 mg by mouth every morning.  01/13/11  Yes Historical Provider, MD  ampicillin (PRINCIPEN) 250 MG capsule Take 250 mg by mouth 4 (four) times daily.  07/30/14  Yes Historical Provider, MD  Ascorbic Acid (VITAMIN C) 1000 MG tablet Take 1,000 mg by mouth daily.   Yes Historical Provider, MD  atenolol (TENORMIN) 100 MG tablet Take 100 mg by mouth every morning.    Yes Historical Provider, MD  B Complex-Biotin-FA (B-COMPLEX PO) Take 1 tablet by mouth daily. '1500mg'$  daily   Yes Historical Provider, MD  FeFum-FePoly-FA-B Cmp-C-Biot (FOLIVANE-PLUS) CAPS Take 1 capsule by mouth daily. 11/13/13  Yes [name], MD  HYDROmorphone (DILAUDID) 2 MG tablet Take 1 tablet (2 mg total) by mouth every 3 (three) hours as needed for severe pain (Q4-6 hours PRN). 06/06/14  Yes Amber Cecilio Asper, PA-C  methocarbamol (ROBAXIN) 500 MG tablet Take 1 tablet (500 mg total) by mouth every 6 (six) hours as needed for muscle spasms. 06/06/14  Yes Amber Cecilio Asper, PA-C  Multiple Vitamin (MULTIVITAMIN) tablet Take 1 tablet by mouth daily.    Yes Historical Provider, MD  MYRBETRIQ 50 MG TB24 tablet Take 50 mg by mouth daily. 01/09/14  Yes Historical Provider, MD  omeprazole (PRILOSEC) 40 MG capsule Take 1 capsule (40 mg total) by mouth every evening. 11/13/13  Yes [name], MD  Sennosides-Docusate Sodium (SENOKOT S PO) Take 1 tablet by mouth daily. 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 There was inability to place a biliary stent via ERCP and request has been made to perform a percutaneous cholangiogram and biliary drainage procedure.  EXAM: 1. ULTRASOUND AND FLUOROSCOPIC GUIDED PERCUTANEOUS TRANSHEPATIC CHOLANGIOGRAM  2. INTERNAL/EXTERNAL PERCUTANEOUS BILIARY DRAINAGE CATHETER PLACEMENT  COMPARISON:  MRI and MRCP study on 08/07/2014  MEDICATIONS: The patient had received a scheduled dose of IV Unasyn approximately 3 hours prior to the procedure. No additional antibiotics were administered for the procedure itself.  CONTRAST:  60m OMNIPAQUE IOHEXOL 300 MG/ML  SOLN  ANESTHESIA/SEDATION: 4.0 mg IV Versed and 75 mcg IV fentanyl.  Total Moderate Sedation Time  39 minutes.  FLUOROSCOPY TIME:  4 minutes and 42 seconds.  COMPLICATIONS: None.  TECHNIQUE: Informed written consent was obtained from [place] a discussion of the risks, benefits and alternatives to treatment. Questions regarding the procedure were encouraged and answered. A timeout was performed prior to the initiation of the procedure.  The right upper abdominal quadrant was prepped and draped in the usual sterile fashion, and a sterile drape was applied covering the operative field. Maximum barrier sterile technique with sterile gowns and gloves were used for the procedure. A timeout was performed prior to the initiation of the procedure.  Ultrasound scanning of the right upper abdominal quadrant was performed to delineate the anatomy and avoid transgression of the gallbladder or the pleura. After the overlying soft tissues were anesthetized with 1% Lidocaine, a 22 gauge Chiba needle was advanced under ultrasound guidance into the right lobe of the  liver. Access was performed of a right lobe bile duct and contrast injected. A cholangiogram was performed. Multiple images were saved. A guidewire was then advanced into the bile ducts. A transitional dilator was placed. Additional contrast was injected and further central cholangiogram performed of the common bile duct.  The transitional dilator was exchanged for a Kumpe catheter over a Benson wire. The catheter was further advanced over a wire through the common bile duct and into the duodenum. The percutaneous tract was dilated and a 10.2 [place] biliary drainage catheter was advanced with coil ultimately locked within the duodenum.  Contrast was injected and a completion radiograph was obtained. The catheter was connected to a gravity drainage bag. The catheter was secured to the skin with a Prolene retention suture.  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 A trickle of contrast was noted to enter the distal duct and duodenum. The stricture was able to be traversed with a catheter and guidewire, allowing placement of an internal/external biliary drainage catheter. This was initially connected to gravity bag drainage. Output and laboratory tests will be followed.  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 A 10 French percutaneous internal/external biliary drainage catheter was able to be placed across the stricture and was formed at the level of the duodenum. The catheter was attached to gravity bag drainage.   Electronically Signed   By: Aletta Edouard M.D.   On: 08/12/2014 12:55    Labs:  CBC:  Recent Labs  08/06/14 2001 08/07/14 0530 08/14/14 0520 08/14/14 1250  WBC 6.1 5.9 7.7 6.4  HGB 11.6* 10.4* 8.7* 8.4*  HCT 35.8*  31.0* 26.6* 25.6*  PLT 163 148* 141* 138*    COAGS:  Recent Labs  06/03/14 1335 08/07/14 0530  INR 1.17 1.04  APTT 37  --     BMP:  Recent Labs  08/09/14 0630 08/12/14 0510 08/14/14 0520 08/15/14 0514  NA 141 141 140 144  K 3.7 3.6 3.6 3.5  CL 107 109 113* 108  CO2 '24 23 22 22  '$ GLUCOSE 104* 97 121* 105*  BUN 14 17 22* 21*  CALCIUM 9.1 8.8* 8.8* 9.4  CREATININE 1.31* 1.25* 1.35* 1.22  GFRNONAA 54* 57* 52* 59*  GFRAA >60 >60 >60 >60    LIVER FUNCTION TESTS:  Recent Labs  08/09/14 0630 08/12/14 0510 08/14/14 0520 08/15/14 0514  BILITOT 6.8* 9.0* 8.6* 9.0*  AST 127* 96* 64* 65*  ALT 321* 178* 131* 116*  ALKPHOS 671* 615* 536* 486*  PROT 6.9 6.5 6.6 7.0  ALBUMIN 3.3* 2.8* 2.9* 3.0*    Assessment and Plan: 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 Bili still elevated 9 (8.6), LFT's minimal trend down, wbc wnl, afberile Continue to follow labs closely, IR will follow Plans per Oncology and GI   Signed: Hedy Jacob 08/15/2014, 11:13 AM   I spent a total of  15 Minutes in face to face in clinical consultation/evaluation, greater than 50% of which was counseling/coordinating care for biliary obstruction secondary to peripancreatic tumor.

## 2014-08-16 ENCOUNTER — Other Ambulatory Visit: Payer: Self-pay | Admitting: Radiology

## 2014-08-16 DIAGNOSIS — K831 Obstruction of bile duct: Secondary | ICD-10-CM

## 2014-08-16 LAB — COMPREHENSIVE METABOLIC PANEL
ALT: 104 U/L — ABNORMAL HIGH (ref 17–63)
ANION GAP: 9 (ref 5–15)
AST: 76 U/L — ABNORMAL HIGH (ref 15–41)
Albumin: 2.9 g/dL — ABNORMAL LOW (ref 3.5–5.0)
Alkaline Phosphatase: 413 U/L — ABNORMAL HIGH (ref 38–126)
BUN: 20 mg/dL (ref 6–20)
CALCIUM: 9.2 mg/dL (ref 8.9–10.3)
CO2: 24 mmol/L (ref 22–32)
Chloride: 109 mmol/L (ref 101–111)
Creatinine, Ser: 1.29 mg/dL — ABNORMAL HIGH (ref 0.61–1.24)
GFR, EST NON AFRICAN AMERICAN: 55 mL/min — AB (ref >60)
GLUCOSE: 113 mg/dL — AB (ref 65–99)
Potassium: 3.7 mmol/L (ref 3.5–5.1)
SODIUM: 142 mmol/L (ref 135–145)
Total Bilirubin: 9.2 mg/dL — ABNORMAL HIGH (ref 0.3–1.2)
Total Protein: 7.1 g/dL (ref 6.5–8.1)

## 2014-08-16 LAB — CBC
HCT: 27.9 % — ABNORMAL LOW (ref 39.0–52.0)
Hemoglobin: 9.4 g/dL — ABNORMAL LOW (ref 13.0–17.0)
MCH: 31.3 pg (ref 26.0–34.0)
MCHC: 33.7 g/dL (ref 30.0–36.0)
MCV: 93 fL (ref 78.0–100.0)
PLATELETS: 220 10*3/uL (ref 150–400)
RBC: 3 MIL/uL — AB (ref 4.22–5.81)
RDW: 16.7 % — AB (ref 11.5–15.5)
WBC: 8.1 10*3/uL (ref 4.0–10.5)

## 2014-08-16 MED ORDER — POLYETHYLENE GLYCOL 3350 17 G PO PACK: 17.0000 g | PACK | Freq: Two times a day (BID) | ORAL | Status: DC

## 2014-08-16 MED ORDER — MUPIROCIN CALCIUM 2 % EX CREA: 1.0000 "application " | TOPICAL_CREAM | Freq: Two times a day (BID) | CUTANEOUS | Status: DC

## 2014-08-16 MED ORDER — HYDROMORPHONE HCL 4 MG PO TABS: 4.0000 mg | ORAL_TABLET | Freq: Four times a day (QID) | ORAL | Status: DC | PRN

## 2014-08-16 MED ORDER — DIPHENHYDRAMINE HCL 25 MG PO TABS: 25.0000 mg | ORAL_TABLET | Freq: Four times a day (QID) | ORAL | Status: DC | PRN

## 2014-08-16 MED ORDER — MORPHINE SULFATE ER 15 MG PO TBCR: 15.0000 mg | EXTENDED_RELEASE_TABLET | Freq: Two times a day (BID) | ORAL | Status: DC

## 2014-08-16 MED ORDER — HEPARIN SOD (PORK) LOCK FLUSH 100 UNIT/ML IV SOLN
500.0000 [IU] | INTRAVENOUS | Status: AC | PRN
  Administered 2014-08-16: 500 [IU]
  Filled 2014-08-16: qty 5

## 2014-08-16 NOTE — Progress Notes (Signed)
Nursing Discharge Summary  Patient ID: [name] MRN: [id number] DOB/AGE: 1945-07-07 69 y.o.  Admit date: 08/06/2014 Discharge date: 08/16/2014  Discharged Condition: good  Disposition: 01-Home or Self Care  Follow-up Information    Follow up with [name], MD.   Specialty:  Family Medicine   Why:  Flu appt is with Dr. Lysle Rubens on Wednesday May 25 at 10:45. The office was notified of the repeat labs which are a cbc and a cmet. Have pt bring his discharge papers to the f/u appt.. Thank you.   Contact information:   [address] [place] [address] [phone]       Follow up with [facility].   Contact information:   Our office will call the patient to setup a 1 week F/U biliary drain injection and CMP labs [phone]      Follow up with [facility].      Prescriptions Given: Prescriptions for dilaudid and ms contin given. Patient follow up appointments and medications discussed and patient verbalized understanding with wife at bedside. No further questions.   Means of Discharge: Patient to be taken downstairs via wheelchair to be discharged home.  Signed: Buel Ream 08/16/2014, 3:12 PM

## 2014-08-16 NOTE — Discharge Summary (Signed)
Physician Discharge Summary  [name] CBJ:[id number] DOB: [birth year] DOA: 08/06/2014  PCP: [name], MD  Admit date: 08/06/2014 Discharge date: 08/16/2014  Time spent: Greater than 30 minutes  Recommendations for Outpatient Follow-up:  1. Dr. [name], PCP in 3-5 days with repeat labs (CBC & CMP). Patient will need refill for his opioid pain medications either through PCP or his Oncologist. 2. Interventional radiology: There are office will call patient with follow-up appointment to be seen in one week 3. Dr. Thea Silversmith, Radiation oncology: Has appointment for 08/19/14 at 4:20 PM to start radiation treatment 4. Dr. [name], Medical oncology: Has appointment for 08/27/14 at 9 AM 5. Home health RN to assist with percutaneous biliary drain  Discharge Diagnoses:  Principal Problem:   Obstructive jaundice due to cancer Active Problems:   Bronchogenic cancer of left lung   Hypertension   Abdominal pain, generalized   CRF (chronic renal failure)   Obstructive jaundice   Malnutrition of moderate degree   Abdominal mass   Abdominal pain, acute   Discharge Condition: Improved & Stable  Diet recommendation: Regular diet  Filed Weights   08/06/14 2247 08/09/14 0931  Weight: 60.4 kg (133 lb 2.5 oz) 60.328 kg (133 lb)    History of present illness:  69 y.o. male with known history of metastatic non-small cell lung cancer came in for abdominal pain. Was found to have obstructive jaundice secondary to a mass around the porta hepatis. FNA Revealed poorly differentiated ca. Underwent percutaneous, transhepatic drain and plan for radiation oncology evaluation.   Hospital Course:   Obstructive jaundice secondary to malignant mass around the porta hepatis - Failed ERCP and unable to place stent 5/13.  - FNA showed poorly differentiated cancer  - Status post percutaneous transhepatic cholangiogram and drain placement 5/16  - LFTs gradually improving but  patient still with substantial amount of pain on 5/19 - started MS Contin 5/19. Pain is much better controlled. - Radiation oncology consultation appreciated: for palliative RT to large metastatic abdominal mass for relief of obstructive jaundice  - Patient was started on Unasyn peri ERCP attempt on 5/13: Discussed with GI/Dr. Roxana Hires to DC Unasyn and no need for outpatient GI follow-up. - Discussed with interventional radiology PA Ms. Tsosie Billing who has evaluated patient and provided instructions for management of percutaneous biliary drain and will arrange outpatient follow-up with their service in 1 week. - Home health RN also arranged for drain management. - Patient is supposed to start radiation treatment on 5/23  Metastatic non-small cell lung cancer, adenocarcinoma  - Medical oncologist input appreciated: Will consider systemic chemotherapy eventually after improvement of his condition.  - Patient has follow-up appointment with oncology in 2 weeks.   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 Some issues with biliary drain overnight which improved after flushing-interventional radiology has reassessed this  morning and discussed with patient.  Filed Vitals:   08/15/14  0510 08/15/14 1347 08/15/14 2034 08/16/14 0434  BP: 142/66 114/62 108/66 124/74  Pulse: 69 70 63 64  Temp: 97.9 F (36.6 C) 98.4 F (36.9 C) 98.8 F (37.1 C) 98.4 F (36.9 C)  TempSrc: Oral Oral Oral Oral  Resp: '17 16 16 16  '$ Height:      Weight:      SpO2: 97% 99% 99% 97%    General exam: moderately built and nourished middle-aged male lying comfortably in bed Respiratory system: Clear. No increased work of breathing. Cardiovascular system: S1 & S2 heard, RRR. No JVD, murmurs, gallops, clicks or pedal edema. Gastrointestinal system: Abdomen is nondistended, soft. Mild RUQ & epigastric tenderness without peritoneal signs. Normal bowel sounds heard. right percutaneous biliary drain in place-draining clear bilious liquids. Minimal erythema around the drain site entering skin-improved. Central nervous system: Alert and oriented. No focal neurological deficits. Extremities: Symmetric 5 x 5 power.  Discharge Instructions      Discharge Instructions    Call MD for:  difficulty breathing, headache or visual disturbances    Complete by:  As directed      Call MD for:  extreme fatigue    Complete by:  As directed      Call MD for:  persistant dizziness or light-headedness    Complete by:  As directed      Call MD for:  persistant nausea and vomiting    Complete by:  As directed      Call MD for:  redness, tenderness, or signs of infection (pain, swelling, redness, odor or green/yellow discharge around incision site)    Complete by:  As directed      Call MD for:  severe uncontrolled pain    Complete by:  As directed      Call MD for:  temperature >100.4    Complete by:  As directed      Diet general    Complete by:  As directed      Increase activity slowly    Complete by:  As directed             Medication List    STOP taking these medications        ampicillin 250 MG capsule  Commonly known as:  PRINCIPEN      TAKE these medications        amLODipine 2.5 MG tablet   Commonly known as:  NORVASC  Take 2.5 mg by mouth every morning.     atenolol 100 MG tablet  Commonly known as:  TENORMIN  Take 100 mg by mouth every morning.     B-COMPLEX PO  Take 1 tablet by mouth daily. '1500mg'$  daily     diphenhydrAMINE 25 MG tablet  Commonly known as:  BENADRYL  Take 1 tablet (25 mg total) by mouth every 6 (six) hours as needed for itching.     FOLIVANE-PLUS Caps  Take 1 capsule by mouth daily.     HYDROmorphone 4 MG tablet  Commonly known as:  DILAUDID  Take 1 tablet (4 mg total) by mouth every 6 (six) hours as needed for moderate pain or severe pain.     methocarbamol 500 MG tablet  Commonly known as:  ROBAXIN  Take 1 tablet (500 mg total) by mouth every 6 (six) hours as needed for muscle spasms.     morphine 15 MG 12 hr tablet  Commonly known as:  MS CONTIN  Take 1 tablet (15 mg total) by mouth every 12 (twelve) hours.     multivitamin tablet  Take 1 tablet by mouth daily.     mupirocin cream 2 %  Commonly known as:  BACTROBAN  Apply 1 application topically 2 (two) times daily. Apply around Percutaneous Biliary drain site.     MYRBETRIQ 50 MG Tb24 tablet  Generic drug:  mirabegron ER  Take 50 mg by mouth daily.     omeprazole 40 MG capsule  Commonly known as:  PRILOSEC  Take 1 capsule (40 mg total) by mouth every evening.     polyethylene glycol packet  Commonly known as:  MIRALAX / GLYCOLAX  Take 17 g by mouth 2 (two) times daily.     SENOKOT S PO  Take 1 tablet by mouth daily. 2 tabs daily     vitamin C 1000 MG tablet  Take 1,000 mg by mouth daily.          The results of significant diagnostics from this hospitalization (including imaging, microbiology, ancillary and laboratory) are listed below for reference.    Significant Diagnostic Studies: Ct Abdomen Pelvis Wo Contrast  08/06/2014   CLINICAL DATA:  Abdominal pain. Abdominal pain with onset of symptoms 1 week ago. History of non-small cell lung carcinoma on maintenance  chemotherapy.  EXAM: CT CHEST, ABDOMEN AND PELVIS WITHOUT CONTRAST  TECHNIQUE: Multidetector CT imaging of the chest, abdomen and pelvis was performed following the standard protocol without IV contrast.  COMPARISON:  CT 05/14/2014.  CT 02/05/2014.  FINDINGS: CT CHEST FINDINGS  Musculoskeletal: No thoracic spine compression fractures. No aggressive osseous lesions.  Lungs: Scarring is present extending to the RIGHT apex with dystrophic calcifications. LEFT pleural apical nodularity is chronic and compatible with scarring. Emphysema.  Central airways: Patent.  Vasculature: Atherosclerosis and coronary artery disease. RIGHT IJ Port-A-Cath. 42 mm ascending aorta.  Effusions: None.  Lymphadenopathy: There is no axillary adenopathy. The high RIGHT pretracheal nodal mass has enlarged and now measures 27 mm x 20 mm (image 19 series 2).  Esophagus: Grossly normal.  CT ABDOMEN AND PELVIS FINDINGS  Musculoskeletal: Lumbar vertebral body height is preserved. No aggressive osseous lesions identified.  Liver: Unenhanced CT was performed per clinician order. Lack of IV contrast limits sensitivity and specificity, especially for evaluation of abdominal/pelvic solid viscera.  There is a mass at the porta hepatis. It is unclear whether this arises from the porta hepatis nodes, the caudate lobe of the liver or the head of the pancreas. Favor a nodal mass or from the head of the pancreas (image 64 series 2). This demonstrates central low attenuation consistent with central necrosis. The mass measures 5 cm x 5 cm on axial imaging and this is a new finding  Spleen:  Normal.  Gallbladder:  Contracted.  No calcified stones.  Common bile duct:  Obscured by a mass in the porta hepatis.  Pancreas: Body and tail the pancreas appear normal. The head and neck are difficult to evaluate because of mass and lack of IV contrast.  Adrenal glands:  Normal bilaterally.  Kidneys:  Within normal limits.  Stomach: Antrum and pylorus anteriorly displaced  by the mass in the porta hepatis.  Small bowel:  No small bowel obstruction or inflammatory changes.  Colon:   Within normal limits.  Pelvic Genitourinary:  Normal bladder.  Peritoneum: No free fluid or free air.  Vascular/lymphatic: Tortuous aortoiliac system with dense atherosclerosis. No gross acute abnormality.  Body Wall: Tiny fat containing periumbilical hernia.  IMPRESSION: 1. Enlarging RIGHT paratracheal nodal mass now measuring 27 mm x 20 mm. This is compatible with malignant adenopathy in a patient with non-small cell lung carcinoma. 2. 42 mm ascending aorta. Recommend annual imaging followup by CTA or MRA. This recommendation follows 2010 ACCF/AHA/AATS/ACR/ASA/SCA/SCAI/SIR/STS/SVM Guidelines for the Diagnosis and Management of Patients with Thoracic Aortic Disease. Circulation. 2010; 121: Z169-C789 3. New 5 cm x 5 cm abdominal mass centered in the porta hepatis, either arising from a node in the region or from the pancreatic head. This probably represents metastatic disease given the increased adenopathy in the chest.   Electronically Signed   By: Dereck Ligas M.D.   On: 08/06/2014 21:05   Ct Chest Wo Contrast  08/06/2014   CLINICAL DATA:  Abdominal pain. Abdominal pain with onset of symptoms 1 week ago. History of non-small cell lung carcinoma on maintenance chemotherapy.  EXAM: CT CHEST, ABDOMEN AND PELVIS WITHOUT CONTRAST  TECHNIQUE: Multidetector CT imaging of the chest, abdomen and pelvis was performed following the standard protocol without IV contrast.  COMPARISON:  CT 05/14/2014.  CT 02/05/2014.  FINDINGS: CT CHEST FINDINGS  Musculoskeletal: No thoracic spine compression fractures. No aggressive osseous lesions.  Lungs: Scarring is present extending to the RIGHT apex with dystrophic calcifications. LEFT pleural apical nodularity is chronic and compatible with scarring. Emphysema.  Central airways: Patent.  Vasculature: Atherosclerosis and coronary artery disease. RIGHT IJ Port-A-Cath. 42 mm  ascending aorta.  Effusions: None.  Lymphadenopathy: There is no axillary adenopathy. The high RIGHT pretracheal nodal mass has enlarged and now measures 27 mm x 20 mm (image 19 series 2).  Esophagus: Grossly normal.  CT ABDOMEN AND PELVIS FINDINGS  Musculoskeletal: Lumbar vertebral body height is preserved. No aggressive osseous lesions identified.  Liver: Unenhanced CT was performed per clinician order. Lack of IV contrast limits sensitivity and specificity, especially for evaluation of abdominal/pelvic solid viscera.  There is a mass at the porta hepatis. It is unclear whether this arises from the porta hepatis nodes, the caudate lobe of the liver or the head of the pancreas. Favor a nodal mass or from the head of the pancreas (image 64 series 2). This demonstrates central low attenuation consistent with central necrosis. The mass measures 5 cm x 5 cm on axial imaging and this is a new finding  Spleen:  Normal.  Gallbladder:  Contracted.  No calcified stones.  Common bile duct:  Obscured by a mass in the porta hepatis.  Pancreas: Body and tail the pancreas appear normal. The head and neck are difficult to evaluate because of mass and lack of IV contrast.  Adrenal glands:  Normal bilaterally.  Kidneys:  Within normal limits.  Stomach: Antrum and pylorus anteriorly displaced by the mass in the porta hepatis.  Small bowel:  No small bowel obstruction or inflammatory changes.  Colon:   Within normal limits.  Pelvic Genitourinary:  Normal bladder.  Peritoneum: No free fluid or free air.  Vascular/lymphatic: Tortuous aortoiliac system with dense atherosclerosis. No gross acute abnormality.  Body Wall: Tiny fat containing periumbilical hernia.  IMPRESSION: 1. Enlarging RIGHT paratracheal nodal mass now measuring 27 mm x 20 mm. This is compatible with malignant adenopathy in a patient with non-small cell lung carcinoma. 2. 42 mm ascending aorta. Recommend annual imaging followup by CTA or MRA. This recommendation follows  2010 ACCF/AHA/AATS/ACR/ASA/SCA/SCAI/SIR/STS/SVM Guidelines for the Diagnosis and Management of Patients with Thoracic Aortic Disease. Circulation. 2010; 121: F810-F751 3. New 5 cm x 5 cm abdominal mass centered in the  porta hepatis, either arising from a node in the region or from the pancreatic head. This probably represents metastatic disease given the increased adenopathy in the chest.   Electronically Signed   By: Dereck Ligas M.D.   On: 08/06/2014 21:05   Mr Abdomen Mrcp Wo Cm  08/07/2014   CLINICAL DATA:  Evaluate abdominal mass  EXAM: MRI ABDOMEN WITHOUT CONTRAST  (INCLUDING MRCP)  TECHNIQUE: Multiplanar multisequence MR imaging of the abdomen was performed. Heavily T2-weighted images of the biliary and pancreatic ducts were obtained, and three-dimensional MRCP images were rendered by post processing.  COMPARISON:  08/06/2014  FINDINGS: Lower chest:  No pleural or pericardial effusion noted.  Hepatobiliary: No focal liver abnormality identified. There is intrahepatic bile duct dilatation. There is fusiform dilatation of the common bile duct which measures up to 11 mm, image 24/series 10. The gallbladder appears normal. There is a large round mass within the right upper quadrant of the abdomen situated between the descending duodenum and head of pancreas. This measures 4.8 x 4.8 x 5.1 cm. This has mass effect upon the distal common bile duct, portal vein at the venous confluence, IVC and descending duodenum.  Pancreas: There is no abnormal dilatation of the pancreatic duct. Body and tail of pancreas appear normal.  Spleen: Negative.  Adrenals/Urinary Tract: The adrenal glands are both normal. Normal appearance of the right kidney. Cyst in the left kidney measures 1.4 cm.  Stomach/Bowel: Unremarkable appearance of the stomach. Right upper quadrant mass exerts mass effect upon the descending duodenum. The visualize jejunum and colon are unremarkable.  Vascular/Lymphatic: Calcified atherosclerotic plaque  involves the abdominal aorta. Multiple enlarged upper abdominal lymph nodes are identified. Index periaortic lymph node measures 1.3 cm, image 40/series 10.  Other: No free fluid or fluid collections identified within the upper abdomen.  Musculoskeletal: Normal signal from within the bone marrow.  IMPRESSION: 1. Large mass within the right upper quadrant of the abdomen is of on certain origin. This is situated between the head of pancreas and descending duodenum. This likely represents either a large nodal mass or lesion arising from the head of pancreas. The mass is partially obstructing the common bile duct and exerts mass effect upon the portal venous confluence, IVC, and descending duodenum. 2. Metastatic adenopathy noted within the left periaortic region.   Electronically Signed   By: Kerby Moors M.D.   On: 08/07/2014 09:58   Mr 3d Recon At Scanner  08/07/2014   CLINICAL DATA:  Evaluate abdominal mass  EXAM: MRI ABDOMEN WITHOUT CONTRAST  (INCLUDING MRCP)  TECHNIQUE: Multiplanar multisequence MR imaging of the abdomen was performed. Heavily T2-weighted images of the biliary and pancreatic ducts were obtained, and three-dimensional MRCP images were rendered by post processing.  COMPARISON:  08/06/2014  FINDINGS: Lower chest:  No pleural or pericardial effusion noted.  Hepatobiliary: No focal liver abnormality identified. There is intrahepatic bile duct dilatation. There is fusiform dilatation of the common bile duct which measures up to 11 mm, image 24/series 10. The gallbladder appears normal. There is a large round mass within the right upper quadrant of the abdomen situated between the descending duodenum and head of pancreas. This measures 4.8 x 4.8 x 5.1 cm. This has mass effect upon the distal common bile duct, portal vein at the venous confluence, IVC and descending duodenum.  Pancreas: There is no abnormal dilatation of the pancreatic duct. Body and tail of pancreas appear normal.  Spleen: Negative.   Adrenals/Urinary Tract: The adrenal glands are both  normal. Normal appearance of the right kidney. Cyst in the left kidney measures 1.4 cm.  Stomach/Bowel: Unremarkable appearance of the stomach. Right upper quadrant mass exerts mass effect upon the descending duodenum. The visualize jejunum and colon are unremarkable.  Vascular/Lymphatic: Calcified atherosclerotic plaque involves the abdominal aorta. Multiple enlarged upper abdominal lymph nodes are identified. Index periaortic lymph node measures 1.3 cm, image 40/series 10.  Other: No free fluid or fluid collections identified within the upper abdomen.  Musculoskeletal: Normal signal from within the bone marrow.  IMPRESSION: 1. Large mass within the right upper quadrant of the abdomen is of on certain origin. This is situated between the head of pancreas and descending duodenum. This likely represents either a large nodal mass or lesion arising from the head of pancreas. The mass is partially obstructing the common bile duct and exerts mass effect upon the portal venous confluence, IVC, and descending duodenum. 2. Metastatic adenopathy noted within the left periaortic region.   Electronically Signed   By: Kerby Moors M.D.   On: 08/07/2014 09:58   Dg Ercp Biliary & Pancreatic Ducts  08/09/2014   CLINICAL DATA:  Tumor obstructing the common bile duct.  EXAM: ERCP performed by Dr. Benson [place].  TECHNIQUE: Multiple spot images obtained with the fluoroscopic device and submitted for interpretation post-procedure.  FLUOROSCOPY TIME:  Fluoroscopy Time:  5 minutes 20 seconds  Number of Acquired Images:  8  COMPARISON:  MRI dated 08/07/2014 and CT scan dated 08/06/2014  FINDINGS: Images demonstrate contrast in the distal common bile duct. Twelve wire was passed to the area stricture. Contrast was injected after balloon dilatation. The contrast appears to be in trans stated into the liver parenchyma. Delayed imaging demonstrates slight diffusion of the contrast into the  liver parenchyma but there does not appear to be extravasation of the contrast from the liver.  IMPRESSION: Extravasated contrast in the liver parenchyma. No evidence of leakage into the peritoneal cavity.  These images were submitted for radiologic interpretation only. Please see the procedural report for the amount of contrast and the fluoroscopy time utilized.   Electronically Signed   By: Lorriane Shire M.D.   On: 08/09/2014 14:09   Ir Int Lianne Cure Biliary Drain With Cholangiogram  08/12/2014   INDICATION: Biliary obstruction secondary to peripancreatic mass. There was inability to place a biliary stent via ERCP and request has been made to perform a percutaneous cholangiogram and biliary drainage procedure.  EXAM: 1. ULTRASOUND AND FLUOROSCOPIC GUIDED PERCUTANEOUS TRANSHEPATIC CHOLANGIOGRAM  2. INTERNAL/EXTERNAL PERCUTANEOUS BILIARY DRAINAGE CATHETER PLACEMENT  COMPARISON:  MRI and MRCP study on 08/07/2014  MEDICATIONS: The patient had received a scheduled dose of IV Unasyn approximately 3 hours prior to the procedure. No additional antibiotics were administered for the procedure itself.  CONTRAST:  67m OMNIPAQUE IOHEXOL 300 MG/ML  SOLN  ANESTHESIA/SEDATION: 4.0 mg IV Versed and 75 mcg IV fentanyl.  Total Moderate Sedation Time  39 minutes.  FLUOROSCOPY TIME:  4 minutes and 42 seconds.  COMPLICATIONS: None.  TECHNIQUE: Informed written consent was obtained from BHarrisafter a discussion of the risks, benefits and alternatives to treatment. Questions regarding the procedure were encouraged and answered. A timeout was performed prior to the initiation of the procedure.  The right upper abdominal quadrant was prepped and draped in the usual sterile fashion, and a sterile drape was applied covering the operative field. Maximum barrier sterile technique with sterile gowns and gloves were used for the procedure. A timeout was performed prior to the initiation  of the procedure.  Ultrasound scanning of the  right upper abdominal quadrant was performed to delineate the anatomy and avoid transgression of the gallbladder or the pleura. After the overlying soft tissues were anesthetized with 1% Lidocaine, a 22 gauge Chiba needle was advanced under ultrasound guidance into the right lobe of the liver. Access was performed of a right lobe bile duct and contrast injected. A cholangiogram was performed. Multiple images were saved. A guidewire was then advanced into the bile ducts. A transitional dilator was placed. Additional contrast was injected and further central cholangiogram performed of the common bile duct.  The transitional dilator was exchanged for a Kumpe catheter over a Benson wire. The catheter was further advanced over a wire through the common bile duct and into the duodenum. The percutaneous tract was dilated and a 10.2 [place] biliary drainage catheter was advanced with coil ultimately locked within the duodenum.  Contrast was injected and a completion radiograph was obtained. The catheter was connected to a gravity drainage bag. The catheter was secured to the skin with a Prolene retention suture.  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 A trickle of contrast was noted to enter the distal duct and duodenum. The stricture was able to be traversed with a catheter and guidewire, allowing placement of an internal/external biliary drainage catheter. This was initially connected to gravity bag drainage. Output and laboratory tests will be followed.  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 A 10 French percutaneous internal/external biliary drainage catheter was able to be placed across the stricture and was formed at the level of the  duodenum. The catheter was attached to gravity bag drainage.   Electronically Signed   By: Aletta Edouard M.D.   On: 08/12/2014 12:55    Microbiology: No results found for this or any previous visit (from the past 240 hour(s)).   Labs: Basic Metabolic Panel:  Recent Labs Lab 08/12/14 0510 08/14/14 0520 08/15/14 0514 08/16/14 0635  NA 141 140 144 142  K 3.6 3.6 3.5 3.7  CL 109 113* 108 109  CO2 '23 22 22 24  '$ GLUCOSE 97 121* 105* 113*  BUN 17 22* 21* 20  CREATININE 1.25* 1.35* 1.22 1.29*  CALCIUM 8.8* 8.8* 9.4 9.2   Liver Function Tests:  Recent Labs Lab 08/12/14 0510 08/14/14 0520 08/15/14 0514 08/16/14 0635  AST 96* 64* 65* 76*  ALT 178* 131* 116* 104*  ALKPHOS 615* 536* 486* 413*  BILITOT 9.0* 8.6* 9.0* 9.2*  PROT 6.5 6.6 7.0 7.1  ALBUMIN 2.8* 2.9* 3.0* 2.9*   No results for input(s): LIPASE, AMYLASE in the last 168 hours. No results for input(s): AMMONIA in the last 168 hours. CBC:  Recent Labs Lab 08/14/14 0520 08/14/14 1250 08/16/14 0635  WBC 7.7 6.4 8.1  HGB 8.7* 8.4* 9.4*  HCT 26.6* 25.6* 27.9*  MCV 93.3 93.4 93.0  PLT 141* 138* 220   Cardiac Enzymes: No results for input(s): CKTOTAL, CKMB, CKMBINDEX, TROPONINI in the last 168 hours. BNP: BNP (last 3 results) No results for input(s): BNP in the last 8760 hours.  ProBNP (last 3 results) No results for input(s): PROBNP in the last 8760 hours.  CBG:  Recent Labs Lab 08/13/14 1703 08/13/14 2311 08/14/14 0515 08/14/14 1204 08/14/14 1736  GLUCAP 124*  137* 119* 128* 103*        Signed:  Vernell Leep, MD, FACP, FHM. [facility] Pager [phone]  If 7PM-7AM, please contact night-coverage [email or web address] Password [email or web address] 08/16/2014, 11:22 AM

## 2014-08-16 NOTE — Discharge Instructions (Signed)
Jaundice Jaundice is a yellowish discoloration of the skin, whites of the eyes, and mucous membranes. It is caused by increased levels of bilirubin in the blood (hyperbilirubinemia). Bilirubin is produced by the normal breakdown of red blood cells. Jaundice may mean the liver or bile system is not working normally. CAUSES  The most common causes include:  Viral hepatitis.  Gallstones.  Excess use of alcohol.  Liver disease.  Certain cancers. SYMPTOMS   Yellow color to the skin, whites of the eyes, or mucous membranes.  Dark brown colored urine.  Stomach pain.  Light or clay colored stool.  Itchy skin. DIAGNOSIS   Your history will be taken along with a physical exam.  Urine and blood tests.  Abdominal ultrasound.  CT scans.  MRI.  Liver biopsy if the liver disease is suspected.  Endoscopic retrograde cholangiopancreatography (ERCP). TREATMENT  Treatment depends on the cause or related to the treatment of an underlying condition. For example, if jaundice is caused by gallstones, the stones or gallbladder may need to be removed. Other treatments may include:  Rest.  Stopping a certain medicine if it is causing the jaundice.  Giving fluid through the vein (IV fluids).  Surgery (removing gallstones, cancers). Some conditions that cause jaundice can be fatal if not treated. HOME CARE INSTRUCTIONS   Rest.  Drink enough fluids to keep your urine clear or pale yellow.  Avoid all alcoholic drinks.  Only take over-the-counter or prescription medicines for nausea, vomiting, itching, pain, discomfort, or fever as directed by your caregiver.  If jaundice is due to viral hepatitis or an infection:  Avoid close contact with people.  Avoid preparing food for others.  Avoid sharing utensils with others.  Wash your hands often.  Keep all follow-up appointments with your caregiver.  Use skin lotions to relieve itching. SEEK IMMEDIATE MEDICAL CARE IF:   You  have increased pain.  You have repeated vomiting.  You become dehydrated.  You have a fever or persistent symptoms for more than 72 hours.  You have a fever and your symptoms suddenly get worse.  You become weak or confused.  You develop a severe headache. MAKE SURE YOU:   Understand these instructions.  Will watch your condition.  Will get help right away if you are not doing well or get worse. Document Released: 03/15/2005 Document Revised: 06/07/2011 Document Reviewed: 02/27/2010 [facility] Patient Information 2015 [place], [place]. This information is not intended to replace advice given to you by your health care provider. Make sure you discuss any questions you have with your health care provider.

## 2014-08-16 NOTE — Progress Notes (Signed)
PT Cancellation Note  Patient Details Name: [name] MRN: [id number] DOB: 1945/05/02   Cancelled Treatment:    Reason Eval/Treat Not Completed: PT screened, no needs identified, will sign off. Spoke briefly with pt who denied need for PT services. Pt reports ambulating in room to and from bathroom unassisted. Will sign off. Thanks.    Weston Anna, MPT Pager: [phone]

## 2014-08-16 NOTE — Progress Notes (Signed)
Biliary drain flushed with 43m normal saline, tolerated well. Biliary drainage is thick and rusty brown in color. Will cont to monitor patient.

## 2014-08-16 NOTE — Progress Notes (Signed)
Patient will need biliary drain injection in approximately 1 week to evaluate for possible internalization. Since his T. Bili is still elevated (9.2), he will need CMP that day prior to decision of internalization. Discussed this with the patient today and also discussed flushing biliary drain TID with 81m of sterile saline.  KTsosie BillingPA-C Interventional Radiology  08/16/14  11:52 AM

## 2014-08-19 ENCOUNTER — Ambulatory Visit
Admit: 2014-08-19 | Discharge: 2014-08-19 | Disposition: A | Discharge status: Home / Self Care | Payer: Medicare Other | Attending: Radiation Oncology | Admitting: Radiation Oncology

## 2014-08-19 ENCOUNTER — Other Ambulatory Visit: Payer: Self-pay | Admitting: *Deleted

## 2014-08-19 DIAGNOSIS — Z87891 Personal history of nicotine dependence: Secondary | ICD-10-CM | POA: Diagnosis not present

## 2014-08-19 DIAGNOSIS — C3492 Malignant neoplasm of unspecified part of left bronchus or lung: Secondary | ICD-10-CM | POA: Diagnosis present

## 2014-08-19 MED ORDER — MORPHINE SULFATE ER 15 MG PO TBCR: 15.0000 mg | EXTENDED_RELEASE_TABLET | Freq: Two times a day (BID) | ORAL | Status: DC

## 2014-08-19 MED ORDER — HYDROMORPHONE HCL 4 MG PO TABS: 4.0000 mg | ORAL_TABLET | Freq: Four times a day (QID) | ORAL | Status: DC | PRN

## 2014-08-19 NOTE — Telephone Encounter (Signed)
Pt request refill on pain medication, reviewed with MD, ok to refill. Notified pt rx refills ready for pick up.

## 2014-08-20 ENCOUNTER — Telehealth: Payer: Self-pay | Admitting: Internal Medicine

## 2014-08-20 ENCOUNTER — Ambulatory Visit
Admit: 2014-08-20 | Discharge: 2014-08-20 | Disposition: A | Discharge status: Home / Self Care | Payer: Medicare Other | Attending: Radiation Oncology | Admitting: Radiation Oncology

## 2014-08-20 ENCOUNTER — Other Ambulatory Visit: Payer: No Typology Code available for payment source

## 2014-08-20 ENCOUNTER — Ambulatory Visit (HOSPITAL_COMMUNITY): Payer: No Typology Code available for payment source

## 2014-08-20 VITALS — BP 106/71 | HR 62 | Temp 97.8°F | Wt 125.1 lb

## 2014-08-20 DIAGNOSIS — C3492 Malignant neoplasm of unspecified part of left bronchus or lung: Secondary | ICD-10-CM

## 2014-08-20 NOTE — Progress Notes (Signed)
Weekly assessment of radiation to abdomen for pancreatic cancer.Completed 2 of 12 treatments.Denies pain.Had 3 to 4 episodes of nausea/vomiting on yesterday.He did take 1 compazine prior to treatment that was left over from chemotherapy which was not very effective.Requests zofran as this helped him before. Reviewed clinic routine and side effects of radiation.Patient is jaundiced and has a biliary drain.Marland Kitchen

## 2014-08-20 NOTE — Progress Notes (Signed)
Weekly Management Note Current Dose: 5 Gy  Projected Dose: 30 Gy   Narrative:  The patient presents for routine under treatment assessment.  CBCT/MVCT images/Port film x-rays were reviewed.  The chart was checked. Doing well. Nausea after treatment. Feeling ok today post compazine. No BM for 3-4 days. No appetite. Dry mouth. MSContin controlling pain.   Physical Findings:  Unchanged  Vitals:  Filed Vitals:   08/20/14 1609  BP: 106/71  Pulse: 62  Temp: 97.8 F (36.6 C)   Weight:  Wt Readings from Last 3 Encounters:  08/20/14 125 lb 1.6 oz (56.745 kg)  08/09/14 133 lb (60.328 kg)  06/03/14 140 lb (63.504 kg)   Lab Results  Component Value Date   WBC 8.1 08/16/2014   HGB 9.4* 08/16/2014   HCT 27.9* 08/16/2014   MCV 93.0 08/16/2014   PLT 220 08/16/2014   Lab Results  Component Value Date   CREATININE 1.29* 08/16/2014   BUN 20 08/16/2014   NA 142 08/16/2014   K 3.7 08/16/2014   CL 109 08/16/2014   CO2 24 08/16/2014     Impression:  The patient is tolerating radiation.  Plan:  Continue treatment as planned. Discussed side effects of narcotics.

## 2014-08-20 NOTE — Telephone Encounter (Signed)
returned call and s.w. pt and confirmed 5.31 MD visit....pt ok and aware

## 2014-08-21 ENCOUNTER — Ambulatory Visit (HOSPITAL_COMMUNITY): Payer: No Typology Code available for payment source

## 2014-08-21 ENCOUNTER — Other Ambulatory Visit: Payer: No Typology Code available for payment source

## 2014-08-21 ENCOUNTER — Encounter: Payer: Self-pay | Admitting: *Deleted

## 2014-08-21 ENCOUNTER — Ambulatory Visit
Admission: RE | Admit: 2014-08-21 | Discharge: 2014-08-21 | Disposition: A | Discharge status: Home / Self Care | Payer: Medicare Other | Source: Ambulatory Visit | Attending: Radiation Oncology | Admitting: Radiation Oncology

## 2014-08-21 DIAGNOSIS — C3492 Malignant neoplasm of unspecified part of left bronchus or lung: Secondary | ICD-10-CM | POA: Diagnosis not present

## 2014-08-21 NOTE — Progress Notes (Signed)
Mr. 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  He was unable to tolerate Miralax.  Advised to try an enema to evacuate the lower colon and he appeared reluctant. He took 2 Senokot-S today with no results. Other suggestions are warm prune,  in addition to starting Movantik which can take 3 days for results according to the [place].

## 2014-08-22 ENCOUNTER — Ambulatory Visit
Admit: 2014-08-22 | Discharge: 2014-08-22 | Disposition: A | Discharge status: Home / Self Care | Payer: Medicare Other | Attending: Radiation Oncology | Admitting: Radiation Oncology

## 2014-08-22 ENCOUNTER — Telehealth: Payer: Self-pay

## 2014-08-22 DIAGNOSIS — C3492 Malignant neoplasm of unspecified part of left bronchus or lung: Secondary | ICD-10-CM | POA: Diagnosis not present

## 2014-08-22 NOTE — Telephone Encounter (Signed)
Called patient to check on status as he hadn't had a bowel movement in several days.He had an enema yesterday with good results.Reiterated that patient push po fluids and continue to take miralax.

## 2014-08-23 ENCOUNTER — Encounter (HOSPITAL_COMMUNITY): Payer: Self-pay

## 2014-08-23 ENCOUNTER — Ambulatory Visit
Admit: 2014-08-23 | Discharge: 2014-08-23 | Disposition: A | Discharge status: Home / Self Care | Payer: Medicare Other | Attending: Radiation Oncology | Admitting: Radiation Oncology

## 2014-08-23 ENCOUNTER — Emergency Department (HOSPITAL_COMMUNITY): Payer: Medicare Other

## 2014-08-23 ENCOUNTER — Other Ambulatory Visit: Payer: Self-pay | Admitting: Medical Oncology

## 2014-08-23 ENCOUNTER — Telehealth: Payer: Self-pay

## 2014-08-23 ENCOUNTER — Emergency Department (HOSPITAL_COMMUNITY)
Admission: EM | Admit: 2014-08-23 | Discharge: 2014-08-23 | Disposition: A | Discharge status: Home / Self Care | Payer: Medicare Other | Attending: Emergency Medicine | Admitting: Emergency Medicine

## 2014-08-23 DIAGNOSIS — Z792 Long term (current) use of antibiotics: Secondary | ICD-10-CM | POA: Insufficient documentation

## 2014-08-23 DIAGNOSIS — C3492 Malignant neoplasm of unspecified part of left bronchus or lung: Secondary | ICD-10-CM | POA: Diagnosis not present

## 2014-08-23 DIAGNOSIS — Z9842 Cataract extraction status, left eye: Secondary | ICD-10-CM | POA: Insufficient documentation

## 2014-08-23 DIAGNOSIS — H9190 Unspecified hearing loss, unspecified ear: Secondary | ICD-10-CM | POA: Insufficient documentation

## 2014-08-23 DIAGNOSIS — I10 Essential (primary) hypertension: Secondary | ICD-10-CM | POA: Diagnosis not present

## 2014-08-23 DIAGNOSIS — E860 Dehydration: Secondary | ICD-10-CM | POA: Diagnosis not present

## 2014-08-23 DIAGNOSIS — Z87448 Personal history of other diseases of urinary system: Secondary | ICD-10-CM | POA: Insufficient documentation

## 2014-08-23 DIAGNOSIS — Z923 Personal history of irradiation: Secondary | ICD-10-CM | POA: Insufficient documentation

## 2014-08-23 DIAGNOSIS — R17 Unspecified jaundice: Secondary | ICD-10-CM | POA: Diagnosis not present

## 2014-08-23 DIAGNOSIS — Z79899 Other long term (current) drug therapy: Secondary | ICD-10-CM | POA: Insufficient documentation

## 2014-08-23 DIAGNOSIS — Z87891 Personal history of nicotine dependence: Secondary | ICD-10-CM | POA: Insufficient documentation

## 2014-08-23 DIAGNOSIS — Z9841 Cataract extraction status, right eye: Secondary | ICD-10-CM | POA: Insufficient documentation

## 2014-08-23 DIAGNOSIS — Z8739 Personal history of other diseases of the musculoskeletal system and connective tissue: Secondary | ICD-10-CM | POA: Insufficient documentation

## 2014-08-23 DIAGNOSIS — K5900 Constipation, unspecified: Secondary | ICD-10-CM | POA: Insufficient documentation

## 2014-08-23 DIAGNOSIS — R531 Weakness: Secondary | ICD-10-CM | POA: Diagnosis present

## 2014-08-23 DIAGNOSIS — C3490 Malignant neoplasm of unspecified part of unspecified bronchus or lung: Secondary | ICD-10-CM | POA: Insufficient documentation

## 2014-08-23 LAB — PROTIME-INR
INR: 1.33 (ref 0.00–1.49)
PROTHROMBIN TIME: 16.6 s — AB (ref 11.6–15.2)

## 2014-08-23 LAB — COMPREHENSIVE METABOLIC PANEL
ALT: 152 U/L — ABNORMAL HIGH (ref 17–63)
AST: 99 U/L — ABNORMAL HIGH (ref 15–41)
Albumin: 3.3 g/dL — ABNORMAL LOW (ref 3.5–5.0)
Alkaline Phosphatase: 339 U/L — ABNORMAL HIGH (ref 38–126)
Anion gap: 12 (ref 5–15)
BILIRUBIN TOTAL: 9.3 mg/dL — AB (ref 0.3–1.2)
BUN: 51 mg/dL — AB (ref 6–20)
CALCIUM: 9.6 mg/dL (ref 8.9–10.3)
CO2: 23 mmol/L (ref 22–32)
CREATININE: 2.06 mg/dL — AB (ref 0.61–1.24)
Chloride: 104 mmol/L (ref 101–111)
GFR calc Af Amer: 36 mL/min — ABNORMAL LOW (ref >60)
GFR calc non Af Amer: 31 mL/min — ABNORMAL LOW (ref >60)
Glucose, Bld: 159 mg/dL — ABNORMAL HIGH (ref 65–99)
POTASSIUM: 4.2 mmol/L (ref 3.5–5.1)
Sodium: 139 mmol/L (ref 135–145)
TOTAL PROTEIN: 7.8 g/dL (ref 6.5–8.1)

## 2014-08-23 LAB — CBC WITH DIFFERENTIAL/PLATELET
BASOS ABS: 0 10*3/uL (ref 0.0–0.1)
BASOS PCT: 0 % (ref 0–1)
EOS ABS: 0 10*3/uL (ref 0.0–0.7)
Eosinophils Relative: 0 % (ref 0–5)
HEMATOCRIT: 32.6 % — AB (ref 39.0–52.0)
Hemoglobin: 10.5 g/dL — ABNORMAL LOW (ref 13.0–17.0)
Lymphocytes Relative: 6 % — ABNORMAL LOW (ref 12–46)
Lymphs Abs: 0.6 10*3/uL — ABNORMAL LOW (ref 0.7–4.0)
MCH: 30.4 pg (ref 26.0–34.0)
MCHC: 32.2 g/dL (ref 30.0–36.0)
MCV: 94.5 fL (ref 78.0–100.0)
MONOS PCT: 12 % (ref 3–12)
Monocytes Absolute: 1.1 10*3/uL — ABNORMAL HIGH (ref 0.1–1.0)
NEUTROS ABS: 7.4 10*3/uL (ref 1.7–7.7)
NEUTROS PCT: 82 % — AB (ref 43–77)
PLATELETS: 303 10*3/uL (ref 150–400)
RBC: 3.45 MIL/uL — AB (ref 4.22–5.81)
RDW: 16.5 % — ABNORMAL HIGH (ref 11.5–15.5)
WBC: 9.1 10*3/uL (ref 4.0–10.5)

## 2014-08-23 MED ORDER — HYDROMORPHONE HCL 1 MG/ML IJ SOLN
1.0000 mg | Freq: Once | INTRAMUSCULAR | Status: AC
  Administered 2014-08-23: 1 mg via INTRAVENOUS
  Filled 2014-08-23: qty 1

## 2014-08-23 MED ORDER — SODIUM CHLORIDE 0.9 % IV BOLUS (SEPSIS)
2000.0000 mL | Freq: Once | INTRAVENOUS | Status: AC
  Administered 2014-08-23: 2000 mL via INTRAVENOUS

## 2014-08-23 MED ORDER — HEPARIN SOD (PORK) LOCK FLUSH 100 UNIT/ML IV SOLN
500.0000 [IU] | Freq: Once | INTRAVENOUS | Status: AC
  Administered 2014-08-23: 500 [IU]
  Filled 2014-08-23: qty 5

## 2014-08-23 NOTE — Telephone Encounter (Signed)
I called  [facility] per Dr Lindi Adie order for IVF. They  will give fluids to pt on Sunday 5/29 and Tuesday 08/27/14. Order faxed to Duwayne Heck.

## 2014-08-23 NOTE — ED Notes (Signed)
Patient transported to X-ray 

## 2014-08-23 NOTE — ED Provider Notes (Signed)
CSN: [id number]     Arrival date & time 08/23/14  0945 History   First MD Initiated Contact with Patient 08/23/14 [phone]     Chief Complaint  Patient presents with  . Weakness  . Nausea  . not eating      (Consider location/radiation/quality/duration/timing/severity/associated sxs/prior Treatment) HPI Complains of vague abdominal discomfort, nausea and diminished appetite for approximately 2 weeks. Associated symptoms include constipation though he does not feel constipated presently he's been treated with MiraLAX and Senokot. He has stopped his MS Contin due to constipating effects. Takes hydromorphone which intermittently helps his pain. He presently feels pain is under control. He is here today for diminished appetite and feels dehydrated. No fever. No other associated symptoms. Nothing makes symptoms better or worse. He does admit to diminished output in biliary drain over the past 2 weeks Past Medical History  Diagnosis Date  . Hypertension   . Arthritis RIGHT SHOULDER  . Immature cataract BILATERAL  . Non-small cell lung cancer DX SEPT 2010  W/ CHEMORADIATION AT THAT TIME -- NOW  W/ METS--  CURRENTLY ON MAINTENANCE CHEMO TX  EVERY 30 DAYS    ONCOLOGIST- [facility]  . Acute meniscal tear of knee RIGHT KNEE  . BPH (benign prostatic hypertrophy)   . Dizziness and giddiness 04/23/2014  . Tremor 04/23/2014    Right hand  . HOH (hard of hearing)     bilateral hearing aids  . Radiation 01/08/09-01/21/09    Mediastinum 30 Gy x 12 fractions   Past Surgical History  Procedure Laterality Date  . 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 Rotator cuff repair  2008    LEFT SHOULDER  . Bilateral ear drum surgery  1960'S  . Orif left ring finger  and revascularization of radial side  02-16-2007    DEGLOVING INJURY  . Knee arthroscopy  12/21/2011    Procedure: ARTHROSCOPY KNEE;  Surgeon: Tobi Bastos, MD;  Location:  [facility];  Service: Orthopedics;  Laterality: Right;  WITH MEDIAL MENISECTOMY  . Cataract extraction Bilateral   . Tonsillectomy      as child  . Ovarian cyst surgery      removed from left jaw area- benign  . Ovarian cyst surgery       NOTE; pt unaware of why this is in his chart  . Shoulder open rotator cuff repair Right 06/05/2014    Procedure: RIGHT ROTATOR CUFF REPAIR SHOULDER OPEN;  Surgeon: Latanya Maudlin, MD;  Location: [facility];  Service: Orthopedics;  Laterality: Right;  . Ercp N/A 08/09/2014    Procedure: ENDOSCOPIC RETROGRADE CHOLANGIOPANCREATOGRAPHY (ERCP);  Surgeon: Carol Ada, MD;  Location: [facility];  Service: Endoscopy;  Laterality: N/A;  . Eus N/A 08/09/2014    Procedure: UPPER ENDOSCOPIC ULTRASOUND (EUS) RADIAL;  Surgeon: Carol Ada, MD;  Location: [facility];  Service: Endoscopy;  Laterality: N/A;  . Fine needle aspiration N/A 08/09/2014    Procedure: FINE NEEDLE ASPIRATION (FNA) LINEAR;  Surgeon: Carol Ada, MD;  Location: [facility];  Service: Endoscopy;  Laterality: N/A;  . Esophagogastroduodenoscopy (egd) with propofol N/A 08/09/2014    Procedure: ESOPHAGOGASTRODUODENOSCOPY (EGD) WITH PROPOFOL;  Surgeon: Carol Ada, MD;  Location: [facility];  Service: Endoscopy;  Laterality: N/A;   Family History  Problem Relation Age of Onset  . Colon cancer    . Heart attack    . Cancer    . Hypertension    .  Hyperlipidemia    . Congestive Heart Failure Mother   . Cancer Sister   . Cancer Maternal Grandfather   . Colon cancer Father   . Heart attack Brother    History  Substance Use Topics  . Smoking status: Former Smoker -- 2.00 packs/day for 20 years    Types: Cigarettes    Quit date: 03/17/1981  . Smokeless tobacco: Never Used  . Alcohol Use: 3.5 oz/week    7 Standard drinks or equivalent per week     Comment: beer daily    Review of Systems  Constitutional: Positive for appetite change.  Gastrointestinal: Positive for nausea,  abdominal pain and constipation.  Allergic/Immunologic: Positive for immunocompromised state.       Cancer  Neurological: Positive for weakness.       Generalized weakness, feels dehydrated      Allergies  Augmentin; Carboplatin; Levaquin; Percocet; and Zolpidem tartrate  Home Medications   Prior to Admission medications   Medication Sig Start Date End Date Taking? Authorizing Provider  amLODipine (NORVASC) 2.5 MG tablet Take 2.5 mg by mouth every morning.  01/13/11   Historical Provider, MD  Ascorbic Acid (VITAMIN C) 1000 MG tablet Take 1,000 mg by mouth daily.    Historical Provider, MD  atenolol (TENORMIN) 100 MG tablet Take 100 mg by mouth every morning.     Historical Provider, MD  B Complex-Biotin-FA (B-COMPLEX PO) Take 1 tablet by mouth daily. '1500mg'$  daily    Historical Provider, MD  diphenhydrAMINE (BENADRYL) 25 MG tablet Take 1 tablet (25 mg total) by mouth every 6 (six) hours as needed for itching. 08/16/14   Modena Jansky, MD  FeFum-FePoly-FA-B Cmp-C-[facility]) CAPS Take 1 capsule by mouth daily. 11/13/13   [name], MD  HYDROmorphone (DILAUDID) 4 MG tablet Take 1 tablet (4 mg total) by mouth every 6 (six) hours as needed for moderate pain or severe pain. 08/19/14   [name], MD  methocarbamol (ROBAXIN) 500 MG tablet Take 1 tablet (500 mg total) by mouth every 6 (six) hours as needed for muscle spasms. Patient not taking: Reported on 08/20/2014 06/06/14   Ardeen Jourdain, PA-C  morphine (MS CONTIN) 15 MG 12 hr tablet Take 1 tablet (15 mg total) by mouth every 12 (twelve) hours. 08/19/14   [name], MD  Multiple Vitamin (MULTIVITAMIN) tablet Take 1 tablet by mouth daily.     Historical Provider, MD  mupirocin cream (BACTROBAN) 2 % Apply 1 application topically 2 (two) times daily. Apply around Percutaneous Biliary drain site. 08/16/14   Modena Jansky, MD  MYRBETRIQ 50 MG TB24 tablet Take 50 mg by mouth daily. 01/09/14   Historical Provider, MD   omeprazole (PRILOSEC) 40 MG capsule Take 1 capsule (40 mg total) by mouth every evening. 11/13/13   [name], MD  polyethylene glycol [facility] / GLYCOLAX) packet Take 17 g by mouth 2 (two) times daily. 08/16/14   Modena Jansky, MD  Sennosides-Docusate Sodium (SENOKOT S PO) Take 1 tablet by mouth daily. 2 tabs daily    Historical Provider, MD   BP 149/75 mmHg  Pulse 62  Temp(Src) 98 F (36.7 C) (Oral)  Resp 16  Ht '5\' 8"'$  (1.727 m)  Wt 116 lb (52.617 kg)  BMI 17.64 kg/m2  SpO2 99% Physical Exam  Constitutional: He is oriented to person, place, and time.  Chronically ill-appearing  HENT:  Head: Normocephalic and atraumatic.  Mucous membranes dry  Eyes: Conjunctivae are normal. Pupils are equal, round, and reactive to  light. Scleral icterus is present.  Neck: Neck supple. No tracheal deviation present. No thyromegaly present.  Cardiovascular: Normal rate and regular rhythm.   No murmur heard. Pulmonary/Chest: Effort normal and breath sounds normal.  Port-A-Cath at right anterior chest, not red warm or tender  Abdominal: Soft. Bowel sounds are normal. He exhibits no distension. There is no tenderness.  Drained from right abdomen, draining brownish bile  Genitourinary: Penis normal.  Musculoskeletal: Normal range of motion. He exhibits no edema or tenderness.  Neurological: He is alert and oriented to person, place, and time. No cranial nerve deficit. Coordination normal.  Skin: Skin is warm and dry. No rash noted.  Psychiatric: He has a normal mood and affect.  Nursing note and vitals reviewed.   ED Course  Procedures (including critical care time) Labs Review Labs Reviewed  COMPREHENSIVE METABOLIC PANEL  CBC WITH DIFFERENTIAL/PLATELET  PROTIME-INR    Imaging Review No results found.   EKG Interpretation None     11:15 AM requesting pain medicine for abdominal pain. Intravenous hydromorphone ordered. 11:55 AM pain much improved.  1:05 PM patient resting  comfortably  At after treatment with intravenous fluids Results for orders placed or performed during the hospital encounter of 08/23/14  Comprehensive metabolic panel  Result Value Ref Range   Sodium 139 135 - 145 mmol/L   Potassium 4.2 3.5 - 5.1 mmol/L   Chloride 104 101 - 111 mmol/L   CO2 23 22 - 32 mmol/L   Glucose, Bld 159 (H) 65 - 99 mg/dL   BUN 51 (H) 6 - 20 mg/dL   Creatinine, Ser 2.06 (H) 0.61 - 1.24 mg/dL   Calcium 9.6 8.9 - 10.3 mg/dL   Total Protein 7.8 6.5 - 8.1 g/dL   Albumin 3.3 (L) 3.5 - 5.0 g/dL   AST 99 (H) 15 - 41 U/L   ALT 152 (H) 17 - 63 U/L   Alkaline Phosphatase 339 (H) 38 - 126 U/L   Total Bilirubin 9.3 (H) 0.3 - 1.2 mg/dL   GFR calc non Af Amer 31 (L) >60 mL/min   GFR calc Af Amer 36 (L) >60 mL/min   Anion gap 12 5 - 15  CBC with Differential/Platelet  Result Value Ref Range   WBC 9.1 4.0 - 10.5 K/uL   RBC 3.45 (L) 4.22 - 5.81 MIL/uL   Hemoglobin 10.5 (L) 13.0 - 17.0 g/dL   HCT 32.6 (L) 39.0 - 52.0 %   MCV 94.5 78.0 - 100.0 fL   MCH 30.4 26.0 - 34.0 pg   MCHC 32.2 30.0 - 36.0 g/dL   RDW 16.5 (H) 11.5 - 15.5 %   Platelets 303 150 - 400 K/uL   Neutrophils Relative % 82 (H) 43 - 77 %   Neutro Abs 7.4 1.7 - 7.7 K/uL   Lymphocytes Relative 6 (L) 12 - 46 %   Lymphs Abs 0.6 (L) 0.7 - 4.0 K/uL   Monocytes Relative 12 3 - 12 %   Monocytes Absolute 1.1 (H) 0.1 - 1.0 K/uL   Eosinophils Relative 0 0 - 5 %   Eosinophils Absolute 0.0 0.0 - 0.7 K/uL   Basophils Relative 0 0 - 1 %   Basophils Absolute 0.0 0.0 - 0.1 K/uL  Protime-INR  Result Value Ref Range   Prothrombin Time 16.6 (H) 11.6 - 15.2 seconds   INR 1.33 0.00 - 1.49   Ct Abdomen Pelvis Wo Contrast  08/06/2014   CLINICAL DATA:  Abdominal pain. Abdominal pain with onset of symptoms  1 week ago. History of non-small cell lung carcinoma on maintenance chemotherapy.  EXAM: CT CHEST, ABDOMEN AND PELVIS WITHOUT CONTRAST  TECHNIQUE: Multidetector CT imaging of the chest, abdomen and pelvis was performed  following the standard protocol without IV contrast.  COMPARISON:  CT 05/14/2014.  CT 02/05/2014.  FINDINGS: CT CHEST FINDINGS  Musculoskeletal: No thoracic spine compression fractures. No aggressive osseous lesions.  Lungs: Scarring is present extending to the RIGHT apex with dystrophic calcifications. LEFT pleural apical nodularity is chronic and compatible with scarring. Emphysema.  Central airways: Patent.  Vasculature: Atherosclerosis and coronary artery disease. RIGHT IJ Port-A-Cath. 42 mm ascending aorta.  Effusions: None.  Lymphadenopathy: There is no axillary adenopathy. The high RIGHT pretracheal nodal mass has enlarged and now measures 27 mm x 20 mm (image 19 series 2).  Esophagus: Grossly normal.  CT ABDOMEN AND PELVIS FINDINGS  Musculoskeletal: Lumbar vertebral body height is preserved. No aggressive osseous lesions identified.  Liver: Unenhanced CT was performed per clinician order. Lack of IV contrast limits sensitivity and specificity, especially for evaluation of abdominal/pelvic solid viscera.  There is a mass at the porta hepatis. It is unclear whether this arises from the porta hepatis nodes, the caudate lobe of the liver or the head of the pancreas. Favor a nodal mass or from the head of the pancreas (image 64 series 2). This demonstrates central low attenuation consistent with central necrosis. The mass measures 5 cm x 5 cm on axial imaging and this is a new finding  Spleen:  Normal.  Gallbladder:  Contracted.  No calcified stones.  Common bile duct:  Obscured by a mass in the porta hepatis.  Pancreas: Body and tail the pancreas appear normal. The head and neck are difficult to evaluate because of mass and lack of IV contrast.  Adrenal glands:  Normal bilaterally.  Kidneys:  Within normal limits.  Stomach: Antrum and pylorus anteriorly displaced by the mass in the porta hepatis.  Small bowel:  No small bowel obstruction or inflammatory changes.  Colon:   Within normal limits.  Pelvic  Genitourinary:  Normal bladder.  Peritoneum: No free fluid or free air.  Vascular/lymphatic: Tortuous aortoiliac system with dense atherosclerosis. No gross acute abnormality.  Body Wall: Tiny fat containing periumbilical hernia.  IMPRESSION: 1. Enlarging RIGHT paratracheal nodal mass now measuring 27 mm x 20 mm. This is compatible with malignant adenopathy in a patient with non-small cell lung carcinoma. 2. 42 mm ascending aorta. Recommend annual imaging followup by CTA or MRA. This recommendation follows 2010 ACCF/AHA/AATS/ACR/ASA/SCA/SCAI/SIR/STS/SVM Guidelines for the Diagnosis and Management of Patients with Thoracic Aortic Disease. Circulation. 2010; 121: Z169-C789 3. New 5 cm x 5 cm abdominal mass centered in the porta hepatis, either arising from a node in the region or from the pancreatic head. This probably represents metastatic disease given the increased adenopathy in the chest.   Electronically Signed   By: Dereck Ligas M.D.   On: 08/06/2014 21:05   Ct Chest Wo Contrast  08/06/2014   CLINICAL DATA:  Abdominal pain. Abdominal pain with onset of symptoms 1 week ago. History of non-small cell lung carcinoma on maintenance chemotherapy.  EXAM: CT CHEST, ABDOMEN AND PELVIS WITHOUT CONTRAST  TECHNIQUE: Multidetector CT imaging of the chest, abdomen and pelvis was performed following the standard protocol without IV contrast.  COMPARISON:  CT 05/14/2014.  CT 02/05/2014.  FINDINGS: CT CHEST FINDINGS  Musculoskeletal: No thoracic spine compression fractures. No aggressive osseous lesions.  Lungs: Scarring is present extending to the RIGHT  apex with dystrophic calcifications. LEFT pleural apical nodularity is chronic and compatible with scarring. Emphysema.  Central airways: Patent.  Vasculature: Atherosclerosis and coronary artery disease. RIGHT IJ Port-A-Cath. 42 mm ascending aorta.  Effusions: None.  Lymphadenopathy: There is no axillary adenopathy. The high RIGHT pretracheal nodal mass has enlarged and  now measures 27 mm x 20 mm (image 19 series 2).  Esophagus: Grossly normal.  CT ABDOMEN AND PELVIS FINDINGS  Musculoskeletal: Lumbar vertebral body height is preserved. No aggressive osseous lesions identified.  Liver: Unenhanced CT was performed per clinician order. Lack of IV contrast limits sensitivity and specificity, especially for evaluation of abdominal/pelvic solid viscera.  There is a mass at the porta hepatis. It is unclear whether this arises from the porta hepatis nodes, the caudate lobe of the liver or the head of the pancreas. Favor a nodal mass or from the head of the pancreas (image 64 series 2). This demonstrates central low attenuation consistent with central necrosis. The mass measures 5 cm x 5 cm on axial imaging and this is a new finding  Spleen:  Normal.  Gallbladder:  Contracted.  No calcified stones.  Common bile duct:  Obscured by a mass in the porta hepatis.  Pancreas: Body and tail the pancreas appear normal. The head and neck are difficult to evaluate because of mass and lack of IV contrast.  Adrenal glands:  Normal bilaterally.  Kidneys:  Within normal limits.  Stomach: Antrum and pylorus anteriorly displaced by the mass in the porta hepatis.  Small bowel:  No small bowel obstruction or inflammatory changes.  Colon:   Within normal limits.  Pelvic Genitourinary:  Normal bladder.  Peritoneum: No free fluid or free air.  Vascular/lymphatic: Tortuous aortoiliac system with dense atherosclerosis. No gross acute abnormality.  Body Wall: Tiny fat containing periumbilical hernia.  IMPRESSION: 1. Enlarging RIGHT paratracheal nodal mass now measuring 27 mm x 20 mm. This is compatible with malignant adenopathy in a patient with non-small cell lung carcinoma. 2. 42 mm ascending aorta. Recommend annual imaging followup by CTA or MRA. This recommendation follows 2010 ACCF/AHA/AATS/ACR/ASA/SCA/SCAI/SIR/STS/SVM Guidelines for the Diagnosis and Management of Patients with Thoracic Aortic Disease.  Circulation. 2010; 121: [id number] 3. New 5 cm x 5 cm abdominal mass centered in the porta hepatis, either arising from a node in the region or from the pancreatic head. This probably represents metastatic disease given the increased adenopathy in the chest.   Electronically Signed   By: Dereck Ligas M.D.   On: 08/06/2014 21:05   Mr Abdomen Mrcp Wo Cm  08/07/2014   CLINICAL DATA:  Evaluate abdominal mass  EXAM: MRI ABDOMEN WITHOUT CONTRAST  (INCLUDING MRCP)  TECHNIQUE: Multiplanar multisequence MR imaging of the abdomen was performed. Heavily T2-weighted images of the biliary and pancreatic ducts were obtained, and three-dimensional MRCP images were rendered by post processing.  COMPARISON:  08/06/2014  FINDINGS: Lower chest:  No pleural or pericardial effusion noted.  Hepatobiliary: No focal liver abnormality identified. There is intrahepatic bile duct dilatation. There is fusiform dilatation of the common bile duct which measures up to 11 mm, image 24/series 10. The gallbladder appears normal. There is a large round mass within the right upper quadrant of the abdomen situated between the descending duodenum and head of pancreas. This measures 4.8 x 4.8 x 5.1 cm. This has mass effect upon the distal common bile duct, portal vein at the venous confluence, IVC and descending duodenum.  Pancreas: There is no abnormal dilatation of the pancreatic duct.  Body and tail of pancreas appear normal.  Spleen: Negative.  Adrenals/Urinary Tract: The adrenal glands are both normal. Normal appearance of the right kidney. Cyst in the left kidney measures 1.4 cm.  Stomach/Bowel: Unremarkable appearance of the stomach. Right upper quadrant mass exerts mass effect upon the descending duodenum. The visualize jejunum and colon are unremarkable.  Vascular/Lymphatic: Calcified atherosclerotic plaque involves the abdominal aorta. Multiple enlarged upper abdominal lymph nodes are identified. Index periaortic lymph node measures 1.3 cm,  image 40/series 10.  Other: No free fluid or fluid collections identified within the upper abdomen.  Musculoskeletal: Normal signal from within the bone marrow.  IMPRESSION: 1. Large mass within the right upper quadrant of the abdomen is of on certain origin. This is situated between the head of pancreas and descending duodenum. This likely represents either a large nodal mass or lesion arising from the head of pancreas. The mass is partially obstructing the common bile duct and exerts mass effect upon the portal venous confluence, IVC, and descending duodenum. 2. Metastatic adenopathy noted within the left periaortic region.   Electronically Signed   By: Kerby Moors M.D.   On: 08/07/2014 09:58   Mr 3d Recon At Scanner  08/07/2014   CLINICAL DATA:  Evaluate abdominal mass  EXAM: MRI ABDOMEN WITHOUT CONTRAST  (INCLUDING MRCP)  TECHNIQUE: Multiplanar multisequence MR imaging of the abdomen was performed. Heavily T2-weighted images of the biliary and pancreatic ducts were obtained, and three-dimensional MRCP images were rendered by post processing.  COMPARISON:  08/06/2014  FINDINGS: Lower chest:  No pleural or pericardial effusion noted.  Hepatobiliary: No focal liver abnormality identified. There is intrahepatic bile duct dilatation. There is fusiform dilatation of the common bile duct which measures up to 11 mm, image 24/series 10. The gallbladder appears normal. There is a large round mass within the right upper quadrant of the abdomen situated between the descending duodenum and head of pancreas. This measures 4.8 x 4.8 x 5.1 cm. This has mass effect upon the distal common bile duct, portal vein at the venous confluence, IVC and descending duodenum.  Pancreas: There is no abnormal dilatation of the pancreatic duct. Body and tail of pancreas appear normal.  Spleen: Negative.  Adrenals/Urinary Tract: The adrenal glands are both normal. Normal appearance of the right kidney. Cyst in the left kidney measures 1.4  cm.  Stomach/Bowel: Unremarkable appearance of the stomach. Right upper quadrant mass exerts mass effect upon the descending duodenum. The visualize jejunum and colon are unremarkable.  Vascular/Lymphatic: Calcified atherosclerotic plaque involves the abdominal aorta. Multiple enlarged upper abdominal lymph nodes are identified. Index periaortic lymph node measures 1.3 cm, image 40/series 10.  Other: No free fluid or fluid collections identified within the upper abdomen.  Musculoskeletal: Normal signal from within the bone marrow.  IMPRESSION: 1. Large mass within the right upper quadrant of the abdomen is of on certain origin. This is situated between the head of pancreas and descending duodenum. This likely represents either a large nodal mass or lesion arising from the head of pancreas. The mass is partially obstructing the common bile duct and exerts mass effect upon the portal venous confluence, IVC, and descending duodenum. 2. Metastatic adenopathy noted within the left periaortic region.   Electronically Signed   By: Kerby Moors M.D.   On: 08/07/2014 09:58   Dg Ercp Biliary & Pancreatic Ducts  08/09/2014   CLINICAL DATA:  Tumor obstructing the common bile duct.  EXAM: ERCP performed by Dr. Benson Norway.  TECHNIQUE: Multiple  spot images obtained with the fluoroscopic device and submitted for interpretation post-procedure.  FLUOROSCOPY TIME:  Fluoroscopy Time:  5 minutes 20 seconds  Number of Acquired Images:  8  COMPARISON:  MRI dated 08/07/2014 and CT scan dated 08/06/2014  FINDINGS: Images demonstrate contrast in the distal common bile duct. Twelve wire was passed to the area stricture. Contrast was injected after balloon dilatation. The contrast appears to be in trans stated into the liver parenchyma. Delayed imaging demonstrates slight diffusion of the contrast into the liver parenchyma but there does not appear to be extravasation of the contrast from the liver.  IMPRESSION: Extravasated contrast in the  liver parenchyma. No evidence of leakage into the peritoneal cavity.  These images were submitted for radiologic interpretation only. Please see the procedural report for the amount of contrast and the fluoroscopy time utilized.   Electronically Signed   By: Lorriane Shire M.D.   On: 08/09/2014 14:09   Dg Abd Acute W/chest  08/23/2014   CLINICAL DATA:  Mid abdominal pain. Nausea and vomiting. 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  EXAM: DG ABDOMEN ACUTE W/ 1V CHEST  COMPARISON:  CTs of 08/06/2014  FINDINGS: Frontal view of the chest demonstrates a right Port-A-Cath which terminates at the low SVC. Right hemidiaphragm elevation.  Emphysema at the apices, greater on the right. No pleural effusion or pneumothorax. No lobar consolidation. Midline trachea. Normal heart size. Atherosclerosis in the transverse aorta.  Abdominal films demonstrate right upper quadrant percutaneous biliary drain. But no free intraperitoneal air. Scattered small bowel air-fluid levels within the right side of the abdomen.  No bowel distension on supine imaging. Distal gas identified. Vascular calcifications.  IMPRESSION: Nonspecific small bowel air-fluid levels within the right-sided abdomen. No bowel distention to strongly suggest obstruction.  Right upper quadrant biliary drain.  No acute cardiopulmonary disease.   Electronically Signed   By: Abigail Miyamoto M.D.   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 There was inability to place a biliary stent via ERCP and request has been made to perform a percutaneous cholangiogram and biliary drainage procedure.  EXAM: 1. ULTRASOUND AND FLUOROSCOPIC GUIDED PERCUTANEOUS TRANSHEPATIC CHOLANGIOGRAM  2. INTERNAL/EXTERNAL PERCUTANEOUS BILIARY DRAINAGE CATHETER PLACEMENT  COMPARISON:  MRI and MRCP study on 08/07/2014  MEDICATIONS: The patient had received a scheduled dose of IV Unasyn  approximately 3 hours prior to the procedure. No additional antibiotics were administered for the procedure itself.  CONTRAST:  11m OMNIPAQUE IOHEXOL 300 MG/ML  SOLN  ANESTHESIA/SEDATION: 4.0 mg IV Versed and 75 mcg IV fentanyl.  Total Moderate Sedation Time  39 minutes.  FLUOROSCOPY TIME:  4 minutes and 42 seconds.  COMPLICATIONS: None.  TECHNIQUE: Informed written consent was obtained from [place] of the risks, benefits and alternatives to treatment. Questions regarding the procedure were encouraged and answered. A timeout was performed prior to the initiation of the procedure.  The right upper abdominal quadrant was prepped and draped in the usual sterile fashion, and a sterile drape was applied covering the operative field. Maximum barrier sterile technique with sterile gowns and gloves were used for the procedure. A timeout was performed prior to the initiation of the procedure.  Ultrasound scanning of the right upper abdominal quadrant was performed to delineate the anatomy and avoid transgression of the gallbladder or the pleura. After the overlying soft tissues were anesthetized with 1% Lidocaine, a 22 gauge  Chiba needle was advanced under ultrasound guidance into the right lobe of the liver. Access was performed of a right lobe bile duct and contrast injected. A cholangiogram was performed. Multiple images were saved. A guidewire was then advanced into the bile ducts. A transitional dilator was placed. Additional contrast was injected and further central cholangiogram performed of the common bile duct.  The transitional dilator was exchanged for a Kumpe catheter over a Benson wire. The catheter was further advanced over a wire through the common bile duct and into the duodenum. The percutaneous tract was dilated and a 10.2 [place] biliary drainage catheter was advanced with coil ultimately locked within the duodenum.  Contrast was injected and a completion radiograph was  obtained. The catheter was connected to a gravity drainage bag. The catheter was secured to the skin with a Prolene retention suture.  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 A trickle of contrast was noted to enter the distal duct and duodenum. The stricture was able to be traversed with a catheter and guidewire, allowing placement of an internal/external biliary drainage catheter. This was initially connected to gravity bag drainage. Output and laboratory tests will be followed.  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 A 10 French percutaneous internal/external biliary drainage catheter was able to be placed across the stricture and was formed at the level of the duodenum. The catheter was attached to gravity bag drainage.   Electronically Signed   By: Aletta Edouard M.D.   On: 08/12/2014 12:55    MDM  Spoke with dr Estanislado Spire, on call for dr Inda Merlin. He will arrange for home health nurse to arrange for intravenous hydration at home in 2 days. Patient is clinically dehydrated. He has appointment with Dr.Mohaamad in 4 days in the office. I also spoke with Dr.Yamagata who states that her bilirubin is not climbing significantly and patient has no signs of cholangitis. He can wait to have biliary stent adjusted at appointment in 6 days. Clinically patient has no signs of cholangitis. Bilirubin is stable Final diagnoses:  None  Dx #1 dehydration  #2 renal insufficiency #3 elevated liver function tests #4 hyperglycemia #5 anemia      Orlie Dakin, MD 08/23/14 1312

## 2014-08-23 NOTE — Telephone Encounter (Signed)
Patient's wife called to ask about taking patient to emergency department as his appetite is diminsihed, continued weight loss and weakness as well as sleeping excessively.Sympom management nurse not available today.Thought it best to speak with patient and decide whether to just go to ED.Mrs.Matteo did call back while I was with another patient and left message that they were leaving for the ED at 9:00 am.I will check status later if able to come for radiation.

## 2014-08-23 NOTE — Discharge Instructions (Signed)
The cancer center will arrange for you to have intravenous fluids in your home in 2 days. Keep your scheduled appointment with Dr.Mohammad in 4 days. Also keep your scheduled appointment at interventional radiology in 6 days. Return if your condition worsens for any reason.

## 2014-08-23 NOTE — Progress Notes (Addendum)
Pt active with advance home care for [facility] after recent d/c from [place] sent information to Advanced home care coordinator and iv therapy coordinator  Return call from [place] of advanced to state services set up through [facility] cancer center for pt

## 2014-08-23 NOTE — ED Notes (Signed)
Patient's wife reports that the patient was released from hospital a week ago and has had poor po intake and weakness x 1 week.

## 2014-08-25 ENCOUNTER — Emergency Department (HOSPITAL_COMMUNITY): Payer: Medicare Other

## 2014-08-25 ENCOUNTER — Encounter (HOSPITAL_COMMUNITY): Payer: Self-pay | Admitting: *Deleted

## 2014-08-25 ENCOUNTER — Emergency Department (HOSPITAL_COMMUNITY)
Admission: EM | Admit: 2014-08-25 | Discharge: 2014-08-25 | Disposition: A | Discharge status: Home / Self Care | Payer: Medicare Other | Attending: Emergency Medicine | Admitting: Emergency Medicine

## 2014-08-25 DIAGNOSIS — Z79899 Other long term (current) drug therapy: Secondary | ICD-10-CM | POA: Diagnosis not present

## 2014-08-25 DIAGNOSIS — Z9849 Cataract extraction status, unspecified eye: Secondary | ICD-10-CM | POA: Diagnosis not present

## 2014-08-25 DIAGNOSIS — H9190 Unspecified hearing loss, unspecified ear: Secondary | ICD-10-CM | POA: Insufficient documentation

## 2014-08-25 DIAGNOSIS — C787 Secondary malignant neoplasm of liver and intrahepatic bile duct: Secondary | ICD-10-CM | POA: Diagnosis not present

## 2014-08-25 DIAGNOSIS — Z87828 Personal history of other (healed) physical injury and trauma: Secondary | ICD-10-CM | POA: Insufficient documentation

## 2014-08-25 DIAGNOSIS — Z974 Presence of external hearing-aid: Secondary | ICD-10-CM | POA: Insufficient documentation

## 2014-08-25 DIAGNOSIS — Z87448 Personal history of other diseases of urinary system: Secondary | ICD-10-CM | POA: Diagnosis not present

## 2014-08-25 DIAGNOSIS — Z792 Long term (current) use of antibiotics: Secondary | ICD-10-CM | POA: Insufficient documentation

## 2014-08-25 DIAGNOSIS — R52 Pain, unspecified: Secondary | ICD-10-CM

## 2014-08-25 DIAGNOSIS — Z87891 Personal history of nicotine dependence: Secondary | ICD-10-CM | POA: Insufficient documentation

## 2014-08-25 DIAGNOSIS — R1012 Left upper quadrant pain: Secondary | ICD-10-CM

## 2014-08-25 DIAGNOSIS — Z8739 Personal history of other diseases of the musculoskeletal system and connective tissue: Secondary | ICD-10-CM | POA: Diagnosis not present

## 2014-08-25 DIAGNOSIS — I10 Essential (primary) hypertension: Secondary | ICD-10-CM | POA: Diagnosis not present

## 2014-08-25 DIAGNOSIS — R109 Unspecified abdominal pain: Secondary | ICD-10-CM

## 2014-08-25 DIAGNOSIS — C7800 Secondary malignant neoplasm of unspecified lung: Secondary | ICD-10-CM | POA: Insufficient documentation

## 2014-08-25 LAB — COMPREHENSIVE METABOLIC PANEL
ALT: 132 U/L — AB (ref 17–63)
ANION GAP: 9 (ref 5–15)
AST: 78 U/L — ABNORMAL HIGH (ref 15–41)
Albumin: 3.1 g/dL — ABNORMAL LOW (ref 3.5–5.0)
Alkaline Phosphatase: 287 U/L — ABNORMAL HIGH (ref 38–126)
BUN: 34 mg/dL — ABNORMAL HIGH (ref 6–20)
CO2: 23 mmol/L (ref 22–32)
Calcium: 9.4 mg/dL (ref 8.9–10.3)
Chloride: 108 mmol/L (ref 101–111)
Creatinine, Ser: 1.56 mg/dL — ABNORMAL HIGH (ref 0.61–1.24)
GFR calc Af Amer: 51 mL/min — ABNORMAL LOW (ref >60)
GFR, EST NON AFRICAN AMERICAN: 44 mL/min — AB (ref >60)
GLUCOSE: 122 mg/dL — AB (ref 65–99)
Potassium: 4 mmol/L (ref 3.5–5.1)
Sodium: 140 mmol/L (ref 135–145)
TOTAL PROTEIN: 7.1 g/dL (ref 6.5–8.1)
Total Bilirubin: 8.1 mg/dL — ABNORMAL HIGH (ref 0.3–1.2)

## 2014-08-25 LAB — CBC WITH DIFFERENTIAL/PLATELET
Basophils Absolute: 0 10*3/uL (ref 0.0–0.1)
Basophils Relative: 0 % (ref 0–1)
EOS ABS: 0 10*3/uL (ref 0.0–0.7)
Eosinophils Relative: 1 % (ref 0–5)
HEMATOCRIT: 30.8 % — AB (ref 39.0–52.0)
HEMOGLOBIN: 10 g/dL — AB (ref 13.0–17.0)
LYMPHS ABS: 0.5 10*3/uL — AB (ref 0.7–4.0)
LYMPHS PCT: 6 % — AB (ref 12–46)
MCH: 31 pg (ref 26.0–34.0)
MCHC: 32.5 g/dL (ref 30.0–36.0)
MCV: 95.4 fL (ref 78.0–100.0)
MONOS PCT: 13 % — AB (ref 3–12)
Monocytes Absolute: 1 10*3/uL (ref 0.1–1.0)
NEUTROS PCT: 80 % — AB (ref 43–77)
Neutro Abs: 6.2 10*3/uL (ref 1.7–7.7)
Platelets: 194 10*3/uL (ref 150–400)
RBC: 3.23 MIL/uL — AB (ref 4.22–5.81)
RDW: 16.4 % — ABNORMAL HIGH (ref 11.5–15.5)
WBC: 7.7 10*3/uL (ref 4.0–10.5)

## 2014-08-25 MED ORDER — FENTANYL CITRATE (PF) 100 MCG/2ML IJ SOLN
50.0000 ug | Freq: Once | INTRAMUSCULAR | Status: AC
  Administered 2014-08-25: 50 ug via INTRAVENOUS
  Filled 2014-08-25: qty 2

## 2014-08-25 MED ORDER — SODIUM CHLORIDE 0.9 % IV SOLN
Freq: Once | INTRAVENOUS | Status: AC
  Administered 2014-08-25: 01:00:00 via INTRAVENOUS

## 2014-08-25 MED ORDER — FENTANYL CITRATE (PF) 100 MCG/2ML IJ SOLN
50.0000 ug | Freq: Once | INTRAMUSCULAR | Status: AC
Start: 2014-08-25 — End: 2014-08-25
  Administered 2014-08-25: 50 ug via INTRAVENOUS

## 2014-08-25 NOTE — ED Notes (Signed)
Bed: TT27 Expected date:  Expected time:  Means of arrival:  Comments: EMS 69 yo M abd pain / CA

## 2014-08-25 NOTE — Discharge Instructions (Signed)
Abdominal Pain Many things can cause belly (abdominal) pain. Most times, the belly pain is not dangerous. Many cases of belly pain can be watched and treated at home. HOME CARE   Do not take medicines that help you go poop (laxatives) unless told to by your doctor.  Only take medicine as told by your doctor.  Eat or drink as told by your doctor. Your doctor will tell you if you should be on a special diet. GET HELP IF:  You do not know what is causing your belly pain.  You have belly pain while you are sick to your stomach (nauseous) or have runny poop (diarrhea).  You have pain while you pee or poop.  Your belly pain wakes you up at night.  You have belly pain that gets worse or better when you eat.  You have belly pain that gets worse when you eat fatty foods.  You have a fever. GET HELP RIGHT AWAY IF:   The pain does not go away within 2 hours.  You keep throwing up (vomiting).  The pain changes and is only in the right or left part of the belly.  You have bloody or tarry looking poop. MAKE SURE YOU:   Understand these instructions.  Will watch your condition.  Will get help right away if you are not doing well or get worse. Document Released: 09/01/2007 Document Revised: 03/20/2013 Document Reviewed: 11/22/2012 [facility] Patient Information 2015 [place], [place]. This information is not intended to replace advice given to you by your health care provider. Make sure you discuss any questions you have with your health care provider. Today your evaluated for a new worsening sharp pain in your left mid abdomen your x-ray shows that you have no obstruction or free air lab work is unchanged. You did get moderately in the with some IV medication I would recommend that you take your MS Contin as prescribed and increase the frequency that you use your Dilaudid from every 6 hours to 1 tablet every 4-6 hours as needed for breakthrough pain. Please make sure to keep your appointment  as scheduled next week with your oncologist

## 2014-08-25 NOTE — ED Provider Notes (Signed)
CSN: [id number]     Arrival date & time 08/25/14  0002 History   First MD Initiated Contact with Patient 08/25/14 0029     Chief Complaint  Patient presents with  . Abdominal Pain     (Consider location/radiation/quality/duration/timing/severity/associated sxs/prior Treatment) HPI Comments: This is a 69 year old male with a history of metastatic lung cancer with metastases to his liver he recently had a biliary drain placed he's been taking MS Contin and oxycodone for pain this last doses were approximately 9 PM he was awakened from sleep at 10 with acute stabbing left abdominal pain not relieved by his normal pain medicine he denies any fever nausea vomiting diarrhea constipation Patient reports no change in his biliary drainage  Patient is a 69 y.o. male presenting with abdominal pain. The history is provided by the patient.  Abdominal Pain Pain location:  LUQ and LLQ Pain quality: aching   Pain radiates to:  Does not radiate Pain severity:  Moderate Onset quality:  Gradual Duration:  2 hours Timing:  Constant Progression:  Unchanged Chronicity:  New Relieved by:  Nothing Associated symptoms: no chest pain, no constipation, no cough, no diarrhea, no fever, no nausea, no shortness of breath and no vomiting     Past Medical History  Diagnosis Date  . Hypertension   . Arthritis RIGHT SHOULDER  . Immature cataract BILATERAL  . Non-small cell lung cancer DX SEPT 2010  W/ CHEMORADIATION AT THAT TIME -- NOW  W/ METS--  CURRENTLY ON MAINTENANCE CHEMO TX  EVERY 30 DAYS    ONCOLOGIST- DR [facility]  . Acute meniscal tear of knee RIGHT KNEE  . BPH (benign prostatic hypertrophy)   . Dizziness and giddiness 04/23/2014  . Tremor 04/23/2014    Right hand  . HOH (hard of hearing)     bilateral hearing aids  . Radiation 01/08/09-01/21/09    Mediastinum 30 Gy x 12 fractions   Past Surgical History  Procedure Laterality Date  . Amputation finger / thumb  02-17-2007    THROUGH PROXIMAL  PHALANX OF LEFT RING  FINGER (DEGLOVING INJURY)  . Rotator cuff repair  2008    LEFT SHOULDER  . Bilateral ear drum surgery  1960'S  . Orif left ring finger  and revascularization of radial side  02-16-2007    DEGLOVING INJURY  . Knee arthroscopy  12/21/2011    Procedure: ARTHROSCOPY KNEE;  Surgeon: Tobi Bastos, MD;  Location: [facility];  Service: Orthopedics;  Laterality: Right;  WITH MEDIAL MENISECTOMY  . Cataract extraction Bilateral   . Tonsillectomy      as child  . Ovarian cyst surgery      removed from left jaw area- benign  . Ovarian cyst surgery       NOTE; pt unaware of why this is in his chart  . Shoulder open rotator cuff repair Right 06/05/2014    Procedure: RIGHT ROTATOR CUFF REPAIR SHOULDER OPEN;  Surgeon: Latanya Maudlin, MD;  Location: [facility];  Service: Orthopedics;  Laterality: Right;  . Ercp N/A 08/09/2014    Procedure: ENDOSCOPIC RETROGRADE CHOLANGIOPANCREATOGRAPHY (ERCP);  Surgeon: Carol Ada, MD;  Location: [facility];  Service: Endoscopy;  Laterality: N/A;  . Eus N/A 08/09/2014    Procedure: UPPER ENDOSCOPIC ULTRASOUND (EUS) RADIAL;  Surgeon: Carol Ada, MD;  Location: [facility];  Service: Endoscopy;  Laterality: N/A;  . Fine needle aspiration N/A 08/09/2014    Procedure: FINE NEEDLE ASPIRATION (FNA) LINEAR;  Surgeon: Carol Ada, MD;  Location: [facility];  Service: Endoscopy;  Laterality: N/A;  . Esophagogastroduodenoscopy (egd) with propofol N/A 08/09/2014    Procedure: ESOPHAGOGASTRODUODENOSCOPY (EGD) WITH PROPOFOL;  Surgeon: Carol Ada, MD;  Location: [facility];  Service: Endoscopy;  Laterality: N/A;   Family History  Problem Relation Age of Onset  . Colon cancer    . Heart attack    . Cancer    . Hypertension    . Hyperlipidemia    . Congestive Heart Failure Mother   . Cancer Sister   . Cancer Maternal Grandfather   . Colon cancer Father   . Heart attack Brother    History  Substance Use Topics  . Smoking status:  Former Smoker -- 2.00 packs/day for 20 years    Types: Cigarettes    Quit date: 03/17/1981  . Smokeless tobacco: Never Used  . Alcohol Use: 3.5 oz/week    7 Standard drinks or equivalent per week     Comment: beer daily    Review of Systems  Constitutional: Negative for fever.  Respiratory: Negative for cough and shortness of breath.   Cardiovascular: Negative for chest pain.  Gastrointestinal: Positive for abdominal pain. Negative for nausea, vomiting, diarrhea, constipation and abdominal distention.  Skin: Positive for color change.  All other systems reviewed and are negative.     Allergies  Augmentin; Carboplatin; Levaquin; Percocet; and Zolpidem tartrate  Home Medications   Prior to Admission medications   Medication Sig Start Date End Date Taking? Authorizing Provider  Ascorbic Acid (VITAMIN C) 1000 MG tablet Take 1,000 mg by mouth daily.   Yes Historical Provider, MD  atenolol (TENORMIN) 100 MG tablet Take 100 mg by mouth every morning.    Yes Historical Provider, MD  B Complex-Biotin-FA (B-COMPLEX PO) Take 1 tablet by mouth daily. '1500mg'$  daily   Yes Historical Provider, MD  diphenhydrAMINE (BENADRYL) 25 MG tablet Take 1 tablet (25 mg total) by mouth every 6 (six) hours as needed for itching. 08/16/14  Yes Modena Jansky, MD  docusate sodium (COLACE) 100 MG capsule Take 100-200 mg by mouth 2 (two) times daily as needed for mild constipation.   Yes Historical Provider, MD  FeFum-FePoly-FA-B Cmp-C-Biot (FOLIVANE-PLUS) CAPS Take 1 capsule by mouth daily. 11/13/13  Yes [name], MD  HYDROmorphone (DILAUDID) 4 MG tablet Take 1 tablet (4 mg total) by mouth every 6 (six) hours as needed for moderate pain or severe pain. 08/19/14  Yes [name], MD  morphine (MS CONTIN) 15 MG 12 hr tablet Take 1 tablet (15 mg total) by mouth every 12 (twelve) hours. 08/19/14  Yes [name], MD  Multiple Vitamin (MULTIVITAMIN) tablet Take 1 tablet by mouth daily.    Yes Historical  Provider, MD  mupirocin cream (BACTROBAN) 2 % Apply 1 application topically 2 (two) times daily. Apply around Percutaneous Biliary drain site. 08/16/14  Yes Modena Jansky, MD  MYRBETRIQ 50 MG TB24 tablet Take 50 mg by mouth every evening.  01/09/14  Yes Historical Provider, MD  omeprazole (PRILOSEC) 40 MG capsule Take 1 capsule (40 mg total) by mouth every evening. Patient taking differently: Take 40 mg by mouth every morning.  11/13/13  Yes [name], MD  polyethylene glycol [facility] / GLYCOLAX) packet Take 17 g by mouth 2 (two) times daily. Patient taking differently: Take 17 g by mouth 2 (two) times daily as needed for mild constipation or moderate constipation.  08/16/14  Yes Modena Jansky, MD  prochlorperazine (COMPAZINE) 10 MG tablet Take 10 mg by mouth every 6 (six)  hours as needed for nausea or vomiting.   Yes Historical Provider, MD  Sennosides-Docusate Sodium (SENOKOT S PO) Take 2 tablets by mouth daily as needed (constipation).    Yes Historical Provider, MD  methocarbamol (ROBAXIN) 500 MG tablet Take 1 tablet (500 mg total) by mouth every 6 (six) hours as needed for muscle spasms. Patient not taking: Reported on 08/20/2014 06/06/14   Amber Constable, PA-C   BP 117/70 mmHg  Pulse 67  Temp(Src) 97.7 F (36.5 C) (Oral)  Resp 16  SpO2 96% Physical Exam  Constitutional: He appears well-developed and well-nourished.  HENT:  Head: Normocephalic and atraumatic.  Eyes: Pupils are equal, round, and reactive to light.  Neck: Normal range of motion.  Cardiovascular: Normal rate.   Pulmonary/Chest: Effort normal.  Abdominal: He exhibits no distension. There is tenderness.  Musculoskeletal: Normal range of motion.  Neurological: He is alert.  Skin: Skin is warm and dry.  Nursing note and vitals reviewed.   ED Course  Procedures (including critical care time) Labs Review Labs Reviewed  CBC WITH DIFFERENTIAL/PLATELET - Abnormal; Notable for the following:    RBC 3.23 (*)     Hemoglobin 10.0 (*)    HCT 30.8 (*)    RDW 16.4 (*)    Neutrophils Relative % 80 (*)    Lymphocytes Relative 6 (*)    Lymphs Abs 0.5 (*)    Monocytes Relative 13 (*)    All other components within normal limits  COMPREHENSIVE METABOLIC PANEL - Abnormal; Notable for the following:    Glucose, Bld 122 (*)    BUN 34 (*)    Creatinine, Ser 1.56 (*)    Albumin 3.1 (*)    AST 78 (*)    ALT 132 (*)    Alkaline Phosphatase 287 (*)    Total Bilirubin 8.1 (*)    GFR calc non Af Amer 44 (*)    GFR calc Af Amer 51 (*)    All other components within normal limits    Imaging Review Dg Abd Acute W/chest  08/25/2014   CLINICAL DATA:  History of lung cancer, pancreatic mass with pain at hepatic drain site.  EXAM: DG ABDOMEN ACUTE W/ 1V CHEST  COMPARISON:  Acute abdominal series Aug 23, 2014  FINDINGS: Cardiomediastinal silhouette is unremarkable. Similarly elevated RIGHT hemidiaphragm. Lungs are clear, no pleural effusions. No pneumothorax. Soft tissue planes and included osseous structures are non acute; small calcifications projecting in LEFT humeral head may reflect calcific tendinopathy. Single lumen RIGHT chest Port-A-Cath with distal tip projecting at cavoatrial junction.  Bowel gas pattern is nondilated and nonobstructive. Intact pigtail drainage catheter consistent with biliary drain. Ovoid density in RIGHT upper quadrant is unchanged. No intra-abdominal mass effect, pathologic calcifications or free air. Soft tissue planes and included osseous structures are nonsuspicious. Mild aortoiliac vascular calcifications.  IMPRESSION: No acute cardiopulmonary process.  Stable appearance of biliary drain.  Normal bowel gas pattern.  No acute cardiopulmonary process.   Electronically Signed   By: Elon Alas M.D.   On: 08/25/2014 04:10     EKG Interpretation None     Instructed to increase frequency of pain medication to keep appontment as scheduled on Tuesday  MDM   Final diagnoses:  Pain   Left upper quadrant pain         Junius Creamer, NP 08/25/14 [place], NP 08/25/14 1956  Shanon Rosser, MD 08/25/14 2237

## 2014-08-25 NOTE — ED Notes (Signed)
Pt called EMS due to pain at site of drain for his liver,  Pt was asleep and it woke him up hurting,  Pt is being treated for lung CA presently,

## 2014-08-27 ENCOUNTER — Ambulatory Visit
Admission: RE | Admit: 2014-08-27 | Discharge: 2014-08-27 | Disposition: A | Discharge status: Home / Self Care | Payer: Medicare Other | Source: Ambulatory Visit | Attending: Radiation Oncology | Admitting: Radiation Oncology

## 2014-08-27 ENCOUNTER — Other Ambulatory Visit: Payer: Self-pay | Admitting: *Deleted

## 2014-08-27 ENCOUNTER — Ambulatory Visit (HOSPITAL_BASED_OUTPATIENT_CLINIC_OR_DEPARTMENT_OTHER): Payer: Medicare Other | Admitting: Internal Medicine

## 2014-08-27 ENCOUNTER — Encounter: Payer: Self-pay | Admitting: Internal Medicine

## 2014-08-27 ENCOUNTER — Telehealth: Payer: Self-pay | Admitting: Internal Medicine

## 2014-08-27 VITALS — BP 110/63 | HR 63 | Temp 97.7°F | Resp 18 | Ht 68.0 in | Wt 123.8 lb

## 2014-08-27 VITALS — BP 120/71 | HR 61 | Temp 97.7°F | Wt 123.9 lb

## 2014-08-27 DIAGNOSIS — C3411 Malignant neoplasm of upper lobe, right bronchus or lung: Secondary | ICD-10-CM | POA: Diagnosis present

## 2014-08-27 DIAGNOSIS — C3492 Malignant neoplasm of unspecified part of left bronchus or lung: Secondary | ICD-10-CM

## 2014-08-27 DIAGNOSIS — R5383 Other fatigue: Secondary | ICD-10-CM | POA: Insufficient documentation

## 2014-08-27 DIAGNOSIS — D61810 Antineoplastic chemotherapy induced pancytopenia: Secondary | ICD-10-CM | POA: Diagnosis not present

## 2014-08-27 DIAGNOSIS — R5382 Chronic fatigue, unspecified: Secondary | ICD-10-CM | POA: Diagnosis not present

## 2014-08-27 DIAGNOSIS — C3490 Malignant neoplasm of unspecified part of unspecified bronchus or lung: Secondary | ICD-10-CM

## 2014-08-27 MED ORDER — PROCHLORPERAZINE MALEATE 10 MG PO TABS: 10.0000 mg | ORAL_TABLET | Freq: Four times a day (QID) | ORAL | Status: DC | PRN

## 2014-08-27 NOTE — Progress Notes (Signed)
Weekly Management Note Current Dose:15 Gy  Projected Dose:30 Gy   Narrative:  The patient presents for routine under treatment assessment.  CBCT/MVCT images/Port film x-rays were reviewed.  The chart was checked. In ER Saturday night with severe abdominal pain. Since then, all symptoms have improved. Receiving IVF at home. Feels much much better. No nausea.   Physical Findings:  Unchanged. Less jaundice.  Vitals:  Filed Vitals:   08/27/14 1134  BP: 120/71  Pulse: 61  Temp: 97.7 F (36.5 C)   Weight:  Wt Readings from Last 3 Encounters:  08/27/14 123 lb 14.4 oz (56.201 kg)  08/27/14 123 lb 12.8 oz (56.155 kg)  08/23/14 116 lb (52.617 kg)   Lab Results  Component Value Date   WBC 7.7 08/25/2014   HGB 10.0* 08/25/2014   HCT 30.8* 08/25/2014   MCV 95.4 08/25/2014   PLT 194 08/25/2014   Lab Results  Component Value Date   CREATININE 1.56* 08/25/2014   BUN 34* 08/25/2014   NA 140 08/25/2014   K 4.0 08/25/2014   CL 108 08/25/2014   CO2 23 08/25/2014     Impression:  The patient is tolerating radiation.  Plan:  Continue treatment as planned. Glad he is feeling better.

## 2014-08-27 NOTE — Telephone Encounter (Signed)
Gave patient avs report and appointments for June. Per 5/31 pof lab every 2 weeks.

## 2014-08-27 NOTE — Progress Notes (Signed)
Telephone call received from spouse reporting the [facility] in [place] does not have a record of the compazine order on file.  This nurse sent order in and notified patient.  Wouldl ike it sent to [facility] in [place].  Re-sent order to [facility] and called [facility] to cancell.

## 2014-08-27 NOTE — Progress Notes (Addendum)
Weekly assessment of radiation to abdomen.Patient doing much better this week.Nausea and pain controlled.Getting intravenous fluids at home.Bowels working good.

## 2014-08-27 NOTE — Progress Notes (Signed)
[facility] [phone]   Fax:([phone]) [place], [place] [address] [place] [address]  DIAGNOSIS AND DIAGNOSIS: Metastatic non-small cell lung cancer adenocarcinoma diagnosed in September 2010   PRIOR THERAPY:  #1 status post 6 cycles of systemic chemotherapy with carboplatin, Alimta and Avastin given every 3 weeks last dose given 04/23/2009 with disease stabilization.  #2 status post palliative radiotherapy to the mediastinum under the care of Dr. Pablo Ledger. The patient received a total dose of 3000 cGY and 12 fractions completed 01/21/2009.  #3 Maintenance chemotherapy with Alimta at 500 mg per meter squared and Avastin at 15 mg per kilogram given every 3 weeks status post 35 cycles.  #4 Maintenance chemotherapy with Alimta 500 mg/M2 and Avastin 15 mg/kg every 4 weeks,status post 17 cycles, discontinued secondary to disease progression.  #5 Systemic chemotherapy with carboplatin for AUC of 5, Alimta 500 mg/M2 and Avastin 15 mg/kg every 3 weeks, status post 3 cycles, last cycle was given 12/28/2012. Carboplatin was discontinued starting cycle #2 secondary to hypersensitivity reaction. #6  Maintenance chemotherapy again with Alimta 500 mg/M2 and Avastin 15 mg/kg every 3 weeks, first cycle 01/23/2013. Status post 6 cycles.  CURRENT THERAPY:  1) palliative radiotherapy to the large abdominal mass under the care of Dr. Pablo Ledger. 2) immunotherapy with Nivolumab 3 MG/KG every 2 weeks. First dose 09/09/2014  CHEMOTHERAPY INTENT: palliative.  CURRENT # OF CHEMOTHERAPY CYCLES: 1 CURRENT ANTIEMETICS: Compazine when necessary but clearly used.  CURRENT SMOKING STATUS: current nonsmoker  ORAL CHEMOTHERAPY AND CONSENT: None.  CURRENT BISPHOSPHONATES USE: None.  PAIN MANAGEMENT: No Pain  NARCOTICS INDUCED CONSTIPATION: N/A  LIVING WILL AND CODE STATUS: NCB   INTERVAL HISTORY:  [name] 69 y.o. male returns  to the clinic today for follow up visit accompanied by his wife. The patient has been observation for the last 16 months with no significant evidence for disease progression. He was last seen in February 2016 and CT scan of the chest, abdomen and pelvis at that time showed no significant findings for disease progression. Over the last 1-2 weeks the patient has been complaining of bloating as well as abdominal pain. He was seen by his primary care physician and treated with proton pump inhibitor was no improvement. He was referred to Dr. Collene Mares for consideration of upper endoscopy. During the visit he was noted to be jaundiced and repeat comprehensive metabolic panel showed significant elevation of the alkaline phosphatase and liver enzymes. He was admitted to [facility] for further evaluation and management of his condition. CT scan of the chest, abdomen and pelvis performed during his admission showed evidence for disease progression and the chest as well as a large mass between the pancreas and the duodenum. MRI of the abdomen confirmed this finding. He underwent endoscopic ultrasound with biopsy of the large abdominal mass by Dr. Benson Norway. The final pathology was consistent with malignant cells consistent with poorly differentiated carcinoma with suggestion of adenocarcinoma. I requested consultation with Dr. Pablo Ledger for consideration of palliative radiotherapy to the large abdominal mass and the patient is currently undergoing palliative radiotherapy to that area. He has 7 more fractions of radiotherapy. He also has biliary drainage and feeling much better with less jaundice. His pain is also well controlled with the current pain medication. He denied having any significant chest pain, cough or hemoptysis. He denied having any nausea or vomiting, no fever or chills. He is here  today for evaluation and discussion of his treatment options.   MEDICAL HISTORY: Past Medical History  Diagnosis Date  .  Hypertension   . Arthritis RIGHT SHOULDER  . Immature cataract BILATERAL  . Non-small cell lung cancer DX SEPT 2010  W/ CHEMORADIATION AT THAT TIME -- NOW  W/ METS--  CURRENTLY ON MAINTENANCE CHEMO TX  EVERY 30 DAYS    ONCOLOGIST- DR [facility]  . Acute meniscal tear of knee RIGHT KNEE  . BPH (benign prostatic hypertrophy)   . Dizziness and giddiness 04/23/2014  . Tremor 04/23/2014    Right hand  . HOH (hard of hearing)     bilateral hearing aids  . Radiation 01/08/09-01/21/09    Mediastinum 30 Gy x 12 fractions    ALLERGIES:  is allergic to augmentin; carboplatin; levaquin; percocet; and zolpidem tartrate.  MEDICATIONS:  Current Outpatient Prescriptions  Medication Sig Dispense Refill  . Ascorbic Acid (VITAMIN C) 1000 MG tablet Take 1,000 mg by mouth daily.    Marland Kitchen atenolol (TENORMIN) 100 MG tablet Take 100 mg by mouth every morning.     . docusate sodium (COLACE) 100 MG capsule Take 100-200 mg by mouth 2 (two) times daily as needed for mild constipation.    . FeFum-FePoly-FA-B Cmp-C-Biot (FOLIVANE-PLUS) CAPS Take 1 capsule by mouth daily. 90 capsule 3  . HYDROmorphone (DILAUDID) 4 MG tablet Take 1 tablet (4 mg total) by mouth every 6 (six) hours as needed for moderate pain or severe pain. 30 tablet 0  . morphine (MS CONTIN) 15 MG 12 hr tablet Take 1 tablet (15 mg total) by mouth every 12 (twelve) hours. 15 tablet 0  . Multiple Vitamin (MULTIVITAMIN) tablet Take 1 tablet by mouth daily.     Marland Kitchen MYRBETRIQ 50 MG TB24 tablet Take 50 mg by mouth every evening.     Marland Kitchen omeprazole (PRILOSEC) 40 MG capsule Take 1 capsule (40 mg total) by mouth every evening. (Patient taking differently: Take 40 mg by mouth every morning. ) 90 capsule 3  . polyethylene glycol (MIRALAX / GLYCOLAX) packet Take 17 g by mouth 2 (two) times daily. (Patient taking differently: Take 17 g by mouth 2 (two) times daily as needed for mild constipation or moderate constipation. ) 14 each 0  . Sennosides-Docusate Sodium (SENOKOT S  PO) Take 2 tablets by mouth daily as needed (constipation).     . diphenhydrAMINE (BENADRYL) 25 MG tablet Take 1 tablet (25 mg total) by mouth every 6 (six) hours as needed for itching. (Patient not taking: Reported on 08/27/2014)    . mupirocin cream (BACTROBAN) 2 % Apply 1 application topically 2 (two) times daily. Apply around Percutaneous Biliary drain site. (Patient not taking: Reported on 08/27/2014) 15 g 0  . prochlorperazine (COMPAZINE) 10 MG tablet Take 10 mg by mouth every 6 (six) hours as needed for nausea or vomiting.     No current facility-administered medications for this visit.    SURGICAL HISTORY:  Past Surgical History  Procedure Laterality Date  . 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 Rotator cuff repair  2008    LEFT SHOULDER  . Bilateral ear drum surgery  1960'S  . Orif left ring finger  and revascularization of radial side  02-16-2007    DEGLOVING INJURY  . Knee arthroscopy  12/21/2011    Procedure: ARTHROSCOPY KNEE;  Surgeon: Tobi Bastos, MD;  Location: [facility];  Service: Orthopedics;  Laterality: Right;  WITH MEDIAL MENISECTOMY  . Cataract extraction Bilateral   . Tonsillectomy      as child  . Ovarian cyst surgery      removed from left jaw area- benign  . Ovarian cyst surgery       NOTE; pt unaware of why this is in his chart  . Shoulder open rotator cuff repair Right 06/05/2014    Procedure: RIGHT ROTATOR CUFF REPAIR SHOULDER OPEN;  Surgeon: Latanya Maudlin, MD;  Location: [facility];  Service: Orthopedics;  Laterality: Right;  . Ercp N/A 08/09/2014    Procedure: ENDOSCOPIC RETROGRADE CHOLANGIOPANCREATOGRAPHY (ERCP);  Surgeon: Carol Ada, MD;  Location: [facility];  Service: Endoscopy;  Laterality: N/A;  . Eus N/A 08/09/2014    Procedure: UPPER ENDOSCOPIC ULTRASOUND (EUS) RADIAL;  Surgeon: Carol Ada, MD;  Location: [facility];  Service: Endoscopy;  Laterality: N/A;    . Fine needle aspiration N/A 08/09/2014    Procedure: FINE NEEDLE ASPIRATION (FNA) LINEAR;  Surgeon: Carol Ada, MD;  Location: [facility];  Service: Endoscopy;  Laterality: N/A;  . Esophagogastroduodenoscopy (egd) with propofol N/A 08/09/2014    Procedure: ESOPHAGOGASTRODUODENOSCOPY (EGD) WITH PROPOFOL;  Surgeon: Carol Ada, MD;  Location: [facility];  Service: Endoscopy;  Laterality: N/A;    REVIEW OF SYSTEMS:  Constitutional: positive for anorexia, fatigue and weight loss Eyes: negative Ears, nose, mouth, throat, and face: negative Respiratory: negative Cardiovascular: negative Gastrointestinal: negative Genitourinary:negative Integument/breast: negative Hematologic/lymphatic: negative Musculoskeletal:negative Neurological: negative Behavioral/Psych: negative Endocrine: negative Allergic/Immunologic: negative   PHYSICAL EXAMINATION: General appearance: alert, cooperative, fatigued and no distress Head: Normocephalic, without obvious abnormality, atraumatic Neck: no adenopathy, no JVD, supple, symmetrical, trachea midline and thyroid not enlarged, symmetric, no tenderness/mass/nodules Lymph nodes: Cervical, supraclavicular, and axillary nodes normal. Resp: clear to auscultation bilaterally Back: symmetric, no curvature. ROM normal. No CVA tenderness. Cardio: regular rate and rhythm, S1, S2 normal, no murmur, click, rub or gallop GI: soft, non-tender; bowel sounds normal; no masses,  no organomegaly Extremities: extremities normal, atraumatic, no cyanosis or edema Neurologic: Alert and oriented X 3, normal strength and tone. Normal symmetric reflexes. Normal coordination and gait  ECOG PERFORMANCE STATUS: 1 - Symptomatic but completely ambulatory  Blood pressure 110/63, pulse 63, temperature 97.7 F (36.5 C), temperature source Oral, resp. rate 18, height '5\' 8"'$  (1.727 m), weight 123 lb 12.8 oz (56.155 kg), SpO2 99 %.  LABORATORY DATA: Lab Results  Component Value Date    WBC 7.7 08/25/2014   HGB 10.0* 08/25/2014   HCT 30.8* 08/25/2014   MCV 95.4 08/25/2014   PLT 194 08/25/2014      Chemistry      Component Value Date/Time   NA 140 08/25/2014 0123   NA 140 07/02/2014 1015   K 4.0 08/25/2014 0123   K 4.2 07/02/2014 1015   CL 108 08/25/2014 0123   CL 109* 08/29/2012 0856   CO2 23 08/25/2014 0123   CO2 23 07/02/2014 1015   BUN 34* 08/25/2014 0123   BUN 19.3 07/02/2014 1015   CREATININE 1.56* 08/25/2014 0123   CREATININE 1.4* 07/02/2014 1015      Component Value Date/Time   CALCIUM 9.4 08/25/2014 0123   CALCIUM 9.3 07/02/2014 1015   ALKPHOS 287* 08/25/2014 0123   ALKPHOS 116 07/02/2014 1015   AST 78* 08/25/2014 0123   AST 18 07/02/2014 1015   ALT 132* 08/25/2014 0123   ALT 23 07/02/2014 1015   BILITOT 8.1* 08/25/2014 0123   BILITOT 0.57 07/02/2014 1015  RADIOGRAPHIC STUDIES: Ct Abdomen Pelvis Wo Contrast  08/06/2014   CLINICAL DATA:  Abdominal pain. Abdominal pain with onset of symptoms 1 week ago. History of non-small cell lung carcinoma on maintenance chemotherapy.  EXAM: CT CHEST, ABDOMEN AND PELVIS WITHOUT CONTRAST  TECHNIQUE: Multidetector CT imaging of the chest, abdomen and pelvis was performed following the standard protocol without IV contrast.  COMPARISON:  CT 05/14/2014.  CT 02/05/2014.  FINDINGS: CT CHEST FINDINGS  Musculoskeletal: No thoracic spine compression fractures. No aggressive osseous lesions.  Lungs: Scarring is present extending to the RIGHT apex with dystrophic calcifications. LEFT pleural apical nodularity is chronic and compatible with scarring. Emphysema.  Central airways: Patent.  Vasculature: Atherosclerosis and coronary artery disease. RIGHT IJ Port-A-Cath. 42 mm ascending aorta.  Effusions: None.  Lymphadenopathy: There is no axillary adenopathy. The high RIGHT pretracheal nodal mass has enlarged and now measures 27 mm x 20 mm (image 19 series 2).  Esophagus: Grossly normal.  CT ABDOMEN AND PELVIS FINDINGS   Musculoskeletal: Lumbar vertebral body height is preserved. No aggressive osseous lesions identified.  Liver: Unenhanced CT was performed per clinician order. Lack of IV contrast limits sensitivity and specificity, especially for evaluation of abdominal/pelvic solid viscera.  There is a mass at the porta hepatis. It is unclear whether this arises from the porta hepatis nodes, the caudate lobe of the liver or the head of the pancreas. Favor a nodal mass or from the head of the pancreas (image 64 series 2). This demonstrates central low attenuation consistent with central necrosis. The mass measures 5 cm x 5 cm on axial imaging and this is a new finding  Spleen:  Normal.  Gallbladder:  Contracted.  No calcified stones.  Common bile duct:  Obscured by a mass in the porta hepatis.  Pancreas: Body and tail the pancreas appear normal. The head and neck are difficult to evaluate because of mass and lack of IV contrast.  Adrenal glands:  Normal bilaterally.  Kidneys:  Within normal limits.  Stomach: Antrum and pylorus anteriorly displaced by the mass in the porta hepatis.  Small bowel:  No small bowel obstruction or inflammatory changes.  Colon:   Within normal limits.  Pelvic Genitourinary:  Normal bladder.  Peritoneum: No free fluid or free air.  Vascular/lymphatic: Tortuous aortoiliac system with dense atherosclerosis. No gross acute abnormality.  Body Wall: Tiny fat containing periumbilical hernia.  IMPRESSION: 1. Enlarging RIGHT paratracheal nodal mass now measuring 27 mm x 20 mm. This is compatible with malignant adenopathy in a patient with non-small cell lung carcinoma. 2. 42 mm ascending aorta. Recommend annual imaging followup by CTA or MRA. This recommendation follows 2010 ACCF/AHA/AATS/ACR/ASA/SCA/SCAI/SIR/STS/SVM Guidelines for the Diagnosis and Management of Patients with Thoracic Aortic Disease. Circulation. 2010; 121: N170-Y174 3. New 5 cm x 5 cm abdominal mass centered in the porta hepatis, either arising  from a node in the region or from the pancreatic head. This probably represents metastatic disease given the increased adenopathy in the chest.   Electronically Signed   By: Dereck Ligas M.D.   On: 08/06/2014 21:05   Ct Chest Wo Contrast  08/06/2014   CLINICAL DATA:  Abdominal pain. Abdominal pain with onset of symptoms 1 week ago. History of non-small cell lung carcinoma on maintenance chemotherapy.  EXAM: CT CHEST, ABDOMEN AND PELVIS WITHOUT CONTRAST  TECHNIQUE: Multidetector CT imaging of the chest, abdomen and pelvis was performed following the standard protocol without IV contrast.  COMPARISON:  CT 05/14/2014.  CT 02/05/2014.  FINDINGS: CT  CHEST FINDINGS  Musculoskeletal: No thoracic spine compression fractures. No aggressive osseous lesions.  Lungs: Scarring is present extending to the RIGHT apex with dystrophic calcifications. LEFT pleural apical nodularity is chronic and compatible with scarring. Emphysema.  Central airways: Patent.  Vasculature: Atherosclerosis and coronary artery disease. RIGHT IJ Port-A-Cath. 42 mm ascending aorta.  Effusions: None.  Lymphadenopathy: There is no axillary adenopathy. The high RIGHT pretracheal nodal mass has enlarged and now measures 27 mm x 20 mm (image 19 series 2).  Esophagus: Grossly normal.  CT ABDOMEN AND PELVIS FINDINGS  Musculoskeletal: Lumbar vertebral body height is preserved. No aggressive osseous lesions identified.  Liver: Unenhanced CT was performed per clinician order. Lack of IV contrast limits sensitivity and specificity, especially for evaluation of abdominal/pelvic solid viscera.  There is a mass at the porta hepatis. It is unclear whether this arises from the porta hepatis nodes, the caudate lobe of the liver or the head of the pancreas. Favor a nodal mass or from the head of the pancreas (image 64 series 2). This demonstrates central low attenuation consistent with central necrosis. The mass measures 5 cm x 5 cm on axial imaging and this is a new  finding  Spleen:  Normal.  Gallbladder:  Contracted.  No calcified stones.  Common bile duct:  Obscured by a mass in the porta hepatis.  Pancreas: Body and tail the pancreas appear normal. The head and neck are difficult to evaluate because of mass and lack of IV contrast.  Adrenal glands:  Normal bilaterally.  Kidneys:  Within normal limits.  Stomach: Antrum and pylorus anteriorly displaced by the mass in the porta hepatis.  Small bowel:  No small bowel obstruction or inflammatory changes.  Colon:   Within normal limits.  Pelvic Genitourinary:  Normal bladder.  Peritoneum: No free fluid or free air.  Vascular/lymphatic: Tortuous aortoiliac system with dense atherosclerosis. No gross acute abnormality.  Body Wall: Tiny fat containing periumbilical hernia.  IMPRESSION: 1. Enlarging RIGHT paratracheal nodal mass now measuring 27 mm x 20 mm. This is compatible with malignant adenopathy in a patient with non-small cell lung carcinoma. 2. 42 mm ascending aorta. Recommend annual imaging followup by CTA or MRA. This recommendation follows 2010 ACCF/AHA/AATS/ACR/ASA/SCA/SCAI/SIR/STS/SVM Guidelines for the Diagnosis and Management of Patients with Thoracic Aortic Disease. Circulation. 2010; 121: [id number] 3. New 5 cm x 5 cm abdominal mass centered in the porta hepatis, either arising from a node in the region or from the pancreatic head. This probably represents metastatic disease given the increased adenopathy in the chest.   Electronically Signed   By: Dereck Ligas M.D.   On: 08/06/2014 21:05   Mr Abdomen Mrcp Wo Cm  08/07/2014   CLINICAL DATA:  Evaluate abdominal mass  EXAM: MRI ABDOMEN WITHOUT CONTRAST  (INCLUDING MRCP)  TECHNIQUE: Multiplanar multisequence MR imaging of the abdomen was performed. Heavily T2-weighted images of the biliary and pancreatic ducts were obtained, and three-dimensional MRCP images were rendered by post processing.  COMPARISON:  08/06/2014  FINDINGS: Lower chest:  No pleural or pericardial  effusion noted.  Hepatobiliary: No focal liver abnormality identified. There is intrahepatic bile duct dilatation. There is fusiform dilatation of the common bile duct which measures up to 11 mm, image 24/series 10. The gallbladder appears normal. There is a large round mass within the right upper quadrant of the abdomen situated between the descending duodenum and head of pancreas. This measures 4.8 x 4.8 x 5.1 cm. This has mass effect upon the distal common bile  duct, portal vein at the venous confluence, IVC and descending duodenum.  Pancreas: There is no abnormal dilatation of the pancreatic duct. Body and tail of pancreas appear normal.  Spleen: Negative.  Adrenals/Urinary Tract: The adrenal glands are both normal. Normal appearance of the right kidney. Cyst in the left kidney measures 1.4 cm.  Stomach/Bowel: Unremarkable appearance of the stomach. Right upper quadrant mass exerts mass effect upon the descending duodenum. The visualize jejunum and colon are unremarkable.  Vascular/Lymphatic: Calcified atherosclerotic plaque involves the abdominal aorta. Multiple enlarged upper abdominal lymph nodes are identified. Index periaortic lymph node measures 1.3 cm, image 40/series 10.  Other: No free fluid or fluid collections identified within the upper abdomen.  Musculoskeletal: Normal signal from within the bone marrow.  IMPRESSION: 1. Large mass within the right upper quadrant of the abdomen is of on certain origin. This is situated between the head of pancreas and descending duodenum. This likely represents either a large nodal mass or lesion arising from the head of pancreas. The mass is partially obstructing the common bile duct and exerts mass effect upon the portal venous confluence, IVC, and descending duodenum. 2. Metastatic adenopathy noted within the left periaortic region.   Electronically Signed   By: Kerby Moors M.D.   On: 08/07/2014 09:58   Mr 3d Recon At Scanner  08/07/2014   CLINICAL DATA:   Evaluate abdominal mass  EXAM: MRI ABDOMEN WITHOUT CONTRAST  (INCLUDING MRCP)  TECHNIQUE: Multiplanar multisequence MR imaging of the abdomen was performed. Heavily T2-weighted images of the biliary and pancreatic ducts were obtained, and three-dimensional MRCP images were rendered by post processing.  COMPARISON:  08/06/2014  FINDINGS: Lower chest:  No pleural or pericardial effusion noted.  Hepatobiliary: No focal liver abnormality identified. There is intrahepatic bile duct dilatation. There is fusiform dilatation of the common bile duct which measures up to 11 mm, image 24/series 10. The gallbladder appears normal. There is a large round mass within the right upper quadrant of the abdomen situated between the descending duodenum and head of pancreas. This measures 4.8 x 4.8 x 5.1 cm. This has mass effect upon the distal common bile duct, portal vein at the venous confluence, IVC and descending duodenum.  Pancreas: There is no abnormal dilatation of the pancreatic duct. Body and tail of pancreas appear normal.  Spleen: Negative.  Adrenals/Urinary Tract: The adrenal glands are both normal. Normal appearance of the right kidney. Cyst in the left kidney measures 1.4 cm.  Stomach/Bowel: Unremarkable appearance of the stomach. Right upper quadrant mass exerts mass effect upon the descending duodenum. The visualize jejunum and colon are unremarkable.  Vascular/Lymphatic: Calcified atherosclerotic plaque involves the abdominal aorta. Multiple enlarged upper abdominal lymph nodes are identified. Index periaortic lymph node measures 1.3 cm, image 40/series 10.  Other: No free fluid or fluid collections identified within the upper abdomen.  Musculoskeletal: Normal signal from within the bone marrow.  IMPRESSION: 1. Large mass within the right upper quadrant of the abdomen is of on certain origin. This is situated between the head of pancreas and descending duodenum. This likely represents either a large nodal mass or  lesion arising from the head of pancreas. The mass is partially obstructing the common bile duct and exerts mass effect upon the portal venous confluence, IVC, and descending duodenum. 2. Metastatic adenopathy noted within the left periaortic region.   Electronically Signed   By: Kerby Moors M.D.   On: 08/07/2014 09:58   Dg Ercp Biliary & Pancreatic Ducts  08/09/2014   CLINICAL DATA:  Tumor obstructing the common bile duct.  EXAM: ERCP performed by Dr. Benson Norway.  TECHNIQUE: Multiple spot images obtained with the fluoroscopic device and submitted for interpretation post-procedure.  FLUOROSCOPY TIME:  Fluoroscopy Time:  5 minutes 20 seconds  Number of Acquired Images:  8  COMPARISON:  MRI dated 08/07/2014 and CT scan dated 08/06/2014  FINDINGS: Images demonstrate contrast in the distal common bile duct. Twelve wire was passed to the area stricture. Contrast was injected after balloon dilatation. The contrast appears to be in trans stated into the liver parenchyma. Delayed imaging demonstrates slight diffusion of the contrast into the liver parenchyma but there does not appear to be extravasation of the contrast from the liver.  IMPRESSION: Extravasated contrast in the liver parenchyma. No evidence of leakage into the peritoneal cavity.  These images were submitted for radiologic interpretation only. Please see the procedural report for the amount of contrast and the fluoroscopy time utilized.   Electronically Signed   By: Lorriane Shire M.D.   On: 08/09/2014 14:09   Dg Abd Acute W/chest  08/25/2014   CLINICAL DATA:  History of lung cancer, pancreatic mass with pain at hepatic drain site.  EXAM: DG ABDOMEN ACUTE W/ 1V CHEST  COMPARISON:  Acute abdominal series Aug 23, 2014  FINDINGS: Cardiomediastinal silhouette is unremarkable. Similarly elevated RIGHT hemidiaphragm. Lungs are clear, no pleural effusions. No pneumothorax. Soft tissue planes and included osseous structures are non acute; small calcifications  projecting in LEFT humeral head may reflect calcific tendinopathy. Single lumen RIGHT chest Port-A-Cath with distal tip projecting at cavoatrial junction.  Bowel gas pattern is nondilated and nonobstructive. Intact pigtail drainage catheter consistent with biliary drain. Ovoid density in RIGHT upper quadrant is unchanged. No intra-abdominal mass effect, pathologic calcifications or free air. Soft tissue planes and included osseous structures are nonsuspicious. Mild aortoiliac vascular calcifications.  IMPRESSION: No acute cardiopulmonary process.  Stable appearance of biliary drain.  Normal bowel gas pattern.  No acute cardiopulmonary process.   Electronically Signed   By: Elon Alas M.D.   On: 08/25/2014 04:10   Dg Abd Acute W/chest  08/23/2014   CLINICAL DATA:  Mid abdominal pain. Nausea and vomiting. 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  EXAM: DG ABDOMEN ACUTE W/ 1V CHEST  COMPARISON:  CTs of 08/06/2014  FINDINGS: Frontal view of the chest demonstrates a right Port-A-Cath which terminates at the low SVC. Right hemidiaphragm elevation.  Emphysema at the apices, greater on the right. No pleural effusion or pneumothorax. No lobar consolidation. Midline trachea. Normal heart size. Atherosclerosis in the transverse aorta.  Abdominal films demonstrate right upper quadrant percutaneous biliary drain. But no free intraperitoneal air. Scattered small bowel air-fluid levels within the right side of the abdomen.  No bowel distension on supine imaging. Distal gas identified. Vascular calcifications.  IMPRESSION: Nonspecific small bowel air-fluid levels within the right-sided abdomen. No bowel distention to strongly suggest obstruction.  Right upper quadrant biliary drain.  No acute cardiopulmonary disease.   Electronically Signed   By: Abigail Miyamoto M.D.   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  There was inability to place a biliary stent via ERCP and request has been made to perform a percutaneous cholangiogram and biliary drainage procedure.  EXAM: 1. ULTRASOUND AND FLUOROSCOPIC GUIDED PERCUTANEOUS TRANSHEPATIC CHOLANGIOGRAM  2. INTERNAL/EXTERNAL PERCUTANEOUS BILIARY DRAINAGE CATHETER PLACEMENT  COMPARISON:  MRI and MRCP  study on 08/07/2014  MEDICATIONS: The patient had received a scheduled dose of IV Unasyn approximately 3 hours prior to the procedure. No additional antibiotics were administered for the procedure itself.  CONTRAST:  73m OMNIPAQUE IOHEXOL 300 MG/ML  SOLN  ANESTHESIA/SEDATION: 4.0 mg IV Versed and 75 mcg IV fentanyl.  Total Moderate Sedation Time  39 minutes.  FLUOROSCOPY TIME:  4 minutes and 42 seconds.  COMPLICATIONS: None.  TECHNIQUE: Informed written consent was obtained from [place] a discussion of the risks, benefits and alternatives to treatment. Questions regarding the procedure were encouraged and answered. A timeout was performed prior to the initiation of the procedure.  The right upper abdominal quadrant was prepped and draped in the usual sterile fashion, and a sterile drape was applied covering the operative field. Maximum barrier sterile technique with sterile gowns and gloves were used for the procedure. A timeout was performed prior to the initiation of the procedure.  Ultrasound scanning of the right upper abdominal quadrant was performed to delineate the anatomy and avoid transgression of the gallbladder or the pleura. After the overlying soft tissues were anesthetized with 1% Lidocaine, a 22 gauge Chiba needle was advanced under ultrasound guidance into the right lobe of the liver. Access was performed of a right lobe bile duct and contrast injected. A cholangiogram was performed. Multiple images were saved. A guidewire was then advanced into the bile ducts. A transitional dilator was placed. Additional contrast was injected and further central  cholangiogram performed of the common bile duct.  The transitional dilator was exchanged for a Kumpe catheter over a Benson wire. The catheter was further advanced over a wire through the common bile duct and into the duodenum. The percutaneous tract was dilated and a 10.2 [place] drainage catheter was advanced with coil ultimately locked within the duodenum.  Contrast was injected and a completion radiograph was obtained. The catheter was connected to a gravity drainage bag. The catheter was secured to the skin with a Prolene retention suture.  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 A trickle of contrast was noted to enter the distal duct and duodenum. The stricture was able to be traversed with a catheter and guidewire, allowing placement of an internal/external biliary drainage catheter. This was initially connected to gravity bag drainage. Output and laboratory tests will be followed.  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 A 10 French percutaneous internal/external biliary drainage catheter was able to be placed across the stricture and was formed at the level of the duodenum. The catheter was attached to gravity bag drainage.   Electronically Signed   By: GAletta EdouardM.D.   On: 08/12/2014 12:55   ASSESSMENT AND PLAN: This is a very pleasant 69years old white male with metastatic non-small cell lung cancer, adenocarcinoma underwent several chemotherapy regimens including recent treatment with systemic chemotherapy with Alimta and Avastin status post 6 cycles. The patient tolerated his previous treatment fairly well except for the increasing fatigue and weakness as well as pancytopenia. He has been observation for the  last 16 months.  Unfortunately the recent imaging studies showed evidence for significant disease progression involving the chest as well as abdominal masses that was biopsy proven to be poorly differentiated carcinoma favoring adenocarcinoma. He is currently undergoing  palliative radiotherapy to the large abdominal mass. I had a lengthy discussion with the patient and his wife about his current disease status and treatment options. I recommended for the patient to complete the course of palliative radiotherapy first. I discussed with him systemic treatment options including treatment with immunotherapy with Nivolumab 3 MG/KG every 2 weeks versus systemic chemotherapy with docetaxel and Cyramza.  The patient and his wife are interested in the immunotherapy. I discussed with them the adverse effect of this treatment including but not limited to immune mediated skin rash, diarrhea, inflammation of the lung, liver, kidney, hypothyroidism, hyperthyroidism as well as other endocrine dysfunction. He is expected to start the first dose of immunotherapy on 09/09/2014 after completion of the radiotherapy. He would come back for follow-up visit at that time for reevaluation. For pain management the patient will continue on the current pain medication. He was advised to call immediately if he has any concerning symptoms in the interval. The patient voices understanding of current disease status and treatment options and is in agreement with the current care plan.  All questions were answered. The patient knows to call the clinic with any problems, questions or concerns. We can certainly see the patient much sooner if necessary.  Disclaimer: This note was dictated with voice recognition software. Similar sounding words can inadvertently be transcribed and may not be corrected upon review.

## 2014-08-28 ENCOUNTER — Ambulatory Visit
Admit: 2014-08-28 | Discharge: 2014-08-28 | Disposition: A | Discharge status: Home / Self Care | Payer: Medicare Other | Attending: Radiation Oncology | Admitting: Radiation Oncology

## 2014-08-28 DIAGNOSIS — C3492 Malignant neoplasm of unspecified part of left bronchus or lung: Secondary | ICD-10-CM | POA: Diagnosis not present

## 2014-08-29 ENCOUNTER — Ambulatory Visit
Admission: RE | Admit: 2014-08-29 | Discharge: 2014-08-29 | Disposition: A | Discharge status: Home / Self Care | Payer: Medicare Other | Source: Ambulatory Visit | Attending: Radiation Oncology | Admitting: Radiation Oncology

## 2014-08-29 ENCOUNTER — Ambulatory Visit (HOSPITAL_COMMUNITY)
Admission: RE | Admit: 2014-08-29 | Discharge: 2014-08-29 | Disposition: A | Discharge status: Home / Self Care | Payer: Medicare Other | Source: Ambulatory Visit | Attending: Radiology | Admitting: Radiology

## 2014-08-29 ENCOUNTER — Encounter (HOSPITAL_COMMUNITY): Payer: Self-pay

## 2014-08-29 ENCOUNTER — Other Ambulatory Visit: Payer: Self-pay | Admitting: Physician Assistant

## 2014-08-29 DIAGNOSIS — K831 Obstruction of bile duct: Secondary | ICD-10-CM

## 2014-08-29 DIAGNOSIS — C3492 Malignant neoplasm of unspecified part of left bronchus or lung: Secondary | ICD-10-CM | POA: Diagnosis not present

## 2014-08-29 DIAGNOSIS — Z539 Procedure and treatment not carried out, unspecified reason: Secondary | ICD-10-CM | POA: Insufficient documentation

## 2014-08-29 MED ORDER — HEPARIN SOD (PORK) LOCK FLUSH 100 UNIT/ML IV SOLN
500.0000 [IU] | Freq: Once | INTRAVENOUS | Status: AC
  Administered 2014-08-29: 500 [IU] via INTRAVENOUS
  Filled 2014-08-29: qty 5

## 2014-08-29 MED ORDER — SODIUM CHLORIDE 0.9 % IV SOLN: INTRAVENOUS | Status: DC

## 2014-08-29 MED ORDER — SODIUM CHLORIDE 0.9 % IJ SOLN
10.0000 mL | INTRAMUSCULAR | Status: DC | PRN
  Administered 2014-08-29: 30 mL via INTRAVENOUS
  Filled 2014-08-29: qty 10

## 2014-08-29 NOTE — Progress Notes (Signed)
Patient's procedure is cancelled. Was already attempting to draw blood through accessed port when IR staff came in and told him so. Continued to attempt to draw enough blood for labs for rescheduled appt. Unable so PAC flushed and patient left   w needle intact. Out w RN via w/c to cancer center

## 2014-08-30 ENCOUNTER — Other Ambulatory Visit: Payer: Self-pay | Admitting: Radiology

## 2014-08-30 ENCOUNTER — Ambulatory Visit
Admission: RE | Admit: 2014-08-30 | Discharge: 2014-08-30 | Disposition: A | Discharge status: Home / Self Care | Payer: Medicare Other | Source: Ambulatory Visit | Attending: Radiation Oncology | Admitting: Radiation Oncology

## 2014-08-30 DIAGNOSIS — C3492 Malignant neoplasm of unspecified part of left bronchus or lung: Secondary | ICD-10-CM | POA: Diagnosis not present

## 2014-08-30 DIAGNOSIS — K831 Obstruction of bile duct: Secondary | ICD-10-CM

## 2014-09-02 ENCOUNTER — Ambulatory Visit
Admission: RE | Admit: 2014-09-02 | Discharge: 2014-09-02 | Disposition: A | Discharge status: Home / Self Care | Payer: Medicare Other | Source: Ambulatory Visit | Attending: Radiation Oncology | Admitting: Radiation Oncology

## 2014-09-02 ENCOUNTER — Telehealth: Payer: Self-pay | Admitting: *Deleted

## 2014-09-02 ENCOUNTER — Other Ambulatory Visit: Payer: Self-pay | Admitting: Physician Assistant

## 2014-09-02 DIAGNOSIS — C3492 Malignant neoplasm of unspecified part of left bronchus or lung: Secondary | ICD-10-CM | POA: Diagnosis not present

## 2014-09-02 MED ORDER — MORPHINE SULFATE ER 15 MG PO TBCR: 15.0000 mg | EXTENDED_RELEASE_TABLET | Freq: Two times a day (BID) | ORAL | Status: DC

## 2014-09-02 NOTE — Telephone Encounter (Signed)
Encounter closed.  Observed prescription ready for patient pick up.

## 2014-09-02 NOTE — Telephone Encounter (Signed)
Called requesting refill on MS Contin 15 mg.  Last refill was 08-19-2014 for 15 tablets.  Will notifyProvider.

## 2014-09-03 ENCOUNTER — Ambulatory Visit
Admission: RE | Admit: 2014-09-03 | Discharge: 2014-09-03 | Disposition: A | Discharge status: Home / Self Care | Payer: Medicare Other | Source: Ambulatory Visit | Attending: Radiation Oncology | Admitting: Radiation Oncology

## 2014-09-03 ENCOUNTER — Ambulatory Visit
Admit: 2014-09-03 | Discharge: 2014-09-03 | Disposition: A | Discharge status: Home / Self Care | Payer: Medicare Other | Attending: Radiation Oncology | Admitting: Radiation Oncology

## 2014-09-03 VITALS — BP 121/70 | HR 60 | Temp 97.7°F | Wt 121.2 lb

## 2014-09-03 DIAGNOSIS — C3492 Malignant neoplasm of unspecified part of left bronchus or lung: Secondary | ICD-10-CM

## 2014-09-03 NOTE — Progress Notes (Signed)
Weekly assessment of radiation to abdomen for pancreatic cancer.Completed 11 of 12 treatments.Pain well controlled with dilaudid.Denies nausea.Skin unchanged.Given card to schedule one month follow up.Knows to call sooner if needed.

## 2014-09-03 NOTE — Progress Notes (Signed)
Weekly Management Note Current Dose:27.5 Gy  Projected Dose:30 Gy   Narrative:  The patient presents for routine under treatment assessment.  CBCT/MVCT images/Port film x-rays were reviewed.  The chart was checked. Doing well. No nausea. Pain well controlled on dialudid and mscontin. Chemo on Monday.   Physical Findings:  Less jaundice. Alert and oriented.  Vitals:  Filed Vitals:   09/03/14 1612  BP: 121/70  Pulse: 60  Temp: 97.7 F (36.5 C)   Weight:  Wt Readings from Last 3 Encounters:  09/03/14 121 lb 3.2 oz (54.976 kg)  08/27/14 123 lb 14.4 oz (56.201 kg)  08/27/14 123 lb 12.8 oz (56.155 kg)   Lab Results  Component Value Date   WBC 7.7 08/25/2014   HGB 10.0* 08/25/2014   HCT 30.8* 08/25/2014   MCV 95.4 08/25/2014   PLT 194 08/25/2014   Lab Results  Component Value Date   CREATININE 1.56* 08/25/2014   BUN 34* 08/25/2014   NA 140 08/25/2014   K 4.0 08/25/2014   CL 108 08/25/2014   CO2 23 08/25/2014     Impression:  The patient is tolerating radiation.  Plan:  Continue treatment as planned. Follow up in 1 month. Discussed weaning off dilaudid.

## 2014-09-04 ENCOUNTER — Other Ambulatory Visit: Payer: Self-pay | Admitting: Physician Assistant

## 2014-09-04 ENCOUNTER — Other Ambulatory Visit (HOSPITAL_COMMUNITY): Payer: Self-pay | Admitting: Radiology

## 2014-09-04 ENCOUNTER — Encounter: Payer: Self-pay | Admitting: Radiation Oncology

## 2014-09-04 ENCOUNTER — Ambulatory Visit
Admit: 2014-09-04 | Discharge: 2014-09-04 | Disposition: A | Discharge status: Home / Self Care | Payer: Medicare Other | Attending: Radiation Oncology | Admitting: Radiation Oncology

## 2014-09-04 DIAGNOSIS — C3492 Malignant neoplasm of unspecified part of left bronchus or lung: Secondary | ICD-10-CM | POA: Diagnosis not present

## 2014-09-04 DIAGNOSIS — C772 Secondary and unspecified malignant neoplasm of intra-abdominal lymph nodes: Secondary | ICD-10-CM

## 2014-09-05 ENCOUNTER — Observation Stay (HOSPITAL_COMMUNITY)
Admission: RE | Admit: 2014-09-05 | Discharge: 2014-09-06 | Disposition: A | Discharge status: Home / Self Care | Payer: Medicare Other | Source: Ambulatory Visit | Attending: Interventional Radiology | Admitting: Interventional Radiology

## 2014-09-05 ENCOUNTER — Ambulatory Visit (HOSPITAL_COMMUNITY)
Admission: RE | Admit: 2014-09-05 | Discharge: 2014-09-05 | Disposition: A | Discharge status: Home / Self Care | Payer: Medicare Other | Source: Ambulatory Visit | Attending: Interventional Radiology | Admitting: Interventional Radiology

## 2014-09-05 ENCOUNTER — Ambulatory Visit (HOSPITAL_COMMUNITY)
Admission: RE | Admit: 2014-09-05 | Discharge: 2014-09-05 | Disposition: A | Discharge status: Home / Self Care | Payer: Medicare Other | Source: Ambulatory Visit

## 2014-09-05 ENCOUNTER — Encounter (HOSPITAL_COMMUNITY): Payer: Self-pay

## 2014-09-05 DIAGNOSIS — Z79899 Other long term (current) drug therapy: Secondary | ICD-10-CM | POA: Diagnosis not present

## 2014-09-05 DIAGNOSIS — K831 Obstruction of bile duct: Principal | ICD-10-CM | POA: Insufficient documentation

## 2014-09-05 DIAGNOSIS — I10 Essential (primary) hypertension: Secondary | ICD-10-CM | POA: Diagnosis not present

## 2014-09-05 DIAGNOSIS — N400 Enlarged prostate without lower urinary tract symptoms: Secondary | ICD-10-CM | POA: Diagnosis not present

## 2014-09-05 DIAGNOSIS — H269 Unspecified cataract: Secondary | ICD-10-CM | POA: Diagnosis not present

## 2014-09-05 DIAGNOSIS — Z85118 Personal history of other malignant neoplasm of bronchus and lung: Secondary | ICD-10-CM | POA: Diagnosis not present

## 2014-09-05 DIAGNOSIS — Z87828 Personal history of other (healed) physical injury and trauma: Secondary | ICD-10-CM | POA: Insufficient documentation

## 2014-09-05 DIAGNOSIS — M1990 Unspecified osteoarthritis, unspecified site: Secondary | ICD-10-CM | POA: Insufficient documentation

## 2014-09-05 DIAGNOSIS — H9190 Unspecified hearing loss, unspecified ear: Secondary | ICD-10-CM | POA: Insufficient documentation

## 2014-09-05 LAB — COMPREHENSIVE METABOLIC PANEL
ALK PHOS: 188 U/L — AB (ref 38–126)
ALT: 122 U/L — ABNORMAL HIGH (ref 17–63)
AST: 96 U/L — ABNORMAL HIGH (ref 15–41)
Albumin: 2.9 g/dL — ABNORMAL LOW (ref 3.5–5.0)
Anion gap: 11 (ref 5–15)
BUN: 13 mg/dL (ref 6–20)
CO2: 24 mmol/L (ref 22–32)
Calcium: 9.1 mg/dL (ref 8.9–10.3)
Chloride: 108 mmol/L (ref 101–111)
Creatinine, Ser: 1.14 mg/dL (ref 0.61–1.24)
GFR calc Af Amer: >60 mL/min (ref >60)
GFR calc non Af Amer: >60 mL/min (ref >60)
Glucose, Bld: 118 mg/dL — ABNORMAL HIGH (ref 65–99)
Potassium: 3.7 mmol/L (ref 3.5–5.1)
Sodium: 143 mmol/L (ref 135–145)
TOTAL PROTEIN: 6.9 g/dL (ref 6.5–8.1)
Total Bilirubin: 7 mg/dL — ABNORMAL HIGH (ref 0.3–1.2)

## 2014-09-05 LAB — CBC WITH DIFFERENTIAL/PLATELET
Basophils Absolute: 0 10*3/uL (ref 0.0–0.1)
Basophils Relative: 0 % (ref 0–1)
EOS ABS: 0 10*3/uL (ref 0.0–0.7)
EOS PCT: 1 % (ref 0–5)
HCT: 29.2 % — ABNORMAL LOW (ref 39.0–52.0)
Hemoglobin: 9.4 g/dL — ABNORMAL LOW (ref 13.0–17.0)
Lymphocytes Relative: 5 % — ABNORMAL LOW (ref 12–46)
Lymphs Abs: 0.2 10*3/uL — ABNORMAL LOW (ref 0.7–4.0)
MCH: 30.9 pg (ref 26.0–34.0)
MCHC: 32.2 g/dL (ref 30.0–36.0)
MCV: 96.1 fL (ref 78.0–100.0)
MONO ABS: 0.6 10*3/uL (ref 0.1–1.0)
MONOS PCT: 14 % — AB (ref 3–12)
NEUTROS PCT: 80 % — AB (ref 43–77)
Neutro Abs: 3.8 10*3/uL (ref 1.7–7.7)
Platelets: 111 10*3/uL — ABNORMAL LOW (ref 150–400)
RBC: 3.04 MIL/uL — ABNORMAL LOW (ref 4.22–5.81)
RDW: 16.2 % — ABNORMAL HIGH (ref 11.5–15.5)
WBC: 4.7 10*3/uL (ref 4.0–10.5)

## 2014-09-05 LAB — PROTIME-INR
INR: 1.32 (ref 0.00–1.49)
Prothrombin Time: 16.5 seconds — ABNORMAL HIGH (ref 11.6–15.2)

## 2014-09-05 LAB — APTT: APTT: 30 s (ref 24–37)

## 2014-09-05 MED ORDER — ENSURE ENLIVE PO LIQD: 237.0000 mL | Freq: Two times a day (BID) | ORAL | Status: DC

## 2014-09-05 MED ORDER — HYDROCODONE-ACETAMINOPHEN 5-325 MG PO TABS
1.0000 | ORAL_TABLET | ORAL | Status: DC | PRN
  Administered 2014-09-05: 1 via ORAL
  Administered 2014-09-05: 2 via ORAL
  Filled 2014-09-05: qty 1
  Filled 2014-09-05 (×2): qty 2

## 2014-09-05 MED ORDER — FENTANYL CITRATE (PF) 100 MCG/2ML IJ SOLN
INTRAMUSCULAR | Status: AC
  Filled 2014-09-05: qty 2

## 2014-09-05 MED ORDER — DIPHENHYDRAMINE HCL 50 MG/ML IJ SOLN: 12.5000 mg | Freq: Four times a day (QID) | INTRAMUSCULAR | Status: DC | PRN

## 2014-09-05 MED ORDER — SENNOSIDES-DOCUSATE SODIUM 8.6-50 MG PO TABS
2.0000 | ORAL_TABLET | Freq: Every day | ORAL | Status: DC
  Administered 2014-09-05: 2 via ORAL
  Filled 2014-09-05 (×2): qty 2

## 2014-09-05 MED ORDER — IOHEXOL 300 MG/ML  SOLN
25.0000 mL | Freq: Once | INTRAMUSCULAR | Status: AC | PRN
  Administered 2014-09-05: 25 mL

## 2014-09-05 MED ORDER — MIDAZOLAM HCL 2 MG/2ML IJ SOLN
INTRAMUSCULAR | Status: AC | PRN
  Administered 2014-09-05 (×3): 0.5 mg via INTRAVENOUS

## 2014-09-05 MED ORDER — ATENOLOL 100 MG PO TABS
100.0000 mg | ORAL_TABLET | Freq: Every morning | ORAL | Status: DC
  Administered 2014-09-06: 100 mg via ORAL
  Filled 2014-09-05: qty 1

## 2014-09-05 MED ORDER — HEPARIN SOD (PORK) LOCK FLUSH 100 UNIT/ML IV SOLN
500.0000 [IU] | Freq: Once | INTRAVENOUS | Status: DC
  Filled 2014-09-05: qty 5

## 2014-09-05 MED ORDER — VITAMIN C 500 MG PO TABS: 1000.0000 mg | ORAL_TABLET | Freq: Every day | ORAL | Status: DC

## 2014-09-05 MED ORDER — MORPHINE SULFATE 2 MG/ML IJ SOLN
4.0000 mg | INTRAMUSCULAR | Status: AC
  Administered 2014-09-05: 4 mg via INTRAVENOUS

## 2014-09-05 MED ORDER — MORPHINE SULFATE (PF) 1 MG/ML IV SOLN
INTRAVENOUS | Status: DC
  Administered 2014-09-05: 15 mg via INTRAVENOUS
  Administered 2014-09-05 – 2014-09-06 (×2): via INTRAVENOUS
  Administered 2014-09-06: 29.9 mg via INTRAVENOUS
  Filled 2014-09-05 (×3): qty 25

## 2014-09-05 MED ORDER — SODIUM CHLORIDE 0.9 % IV SOLN
INTRAVENOUS | Status: DC
  Administered 2014-09-05: 19:00:00 via INTRAVENOUS

## 2014-09-05 MED ORDER — ONDANSETRON HCL 4 MG/2ML IJ SOLN: 4.0000 mg | Freq: Four times a day (QID) | INTRAMUSCULAR | Status: DC

## 2014-09-05 MED ORDER — SENNOSIDES-DOCUSATE SODIUM 8.6-50 MG PO TABS: 2.0000 | ORAL_TABLET | Freq: Two times a day (BID) | ORAL | Status: DC | PRN

## 2014-09-05 MED ORDER — VITAMIN C 500 MG PO TABS
1000.0000 mg | ORAL_TABLET | Freq: Every day | ORAL | Status: DC
  Administered 2014-09-06: 1000 mg via ORAL
  Filled 2014-09-05: qty 2

## 2014-09-05 MED ORDER — MIDAZOLAM HCL 2 MG/2ML IJ SOLN
INTRAMUSCULAR | Status: AC
  Filled 2014-09-05: qty 4

## 2014-09-05 MED ORDER — ADULT MULTIVITAMIN W/MINERALS CH
1.0000 | ORAL_TABLET | Freq: Every day | ORAL | Status: DC
  Administered 2014-09-06: 1 via ORAL
  Filled 2014-09-05: qty 1

## 2014-09-05 MED ORDER — VANCOMYCIN HCL IN DEXTROSE 1-5 GM/200ML-% IV SOLN
1000.0000 mg | INTRAVENOUS | Status: AC
  Administered 2014-09-05: 1000 mg via INTRAVENOUS
  Filled 2014-09-05: qty 200

## 2014-09-05 MED ORDER — ONDANSETRON HCL 4 MG/2ML IJ SOLN: 4.0000 mg | Freq: Four times a day (QID) | INTRAMUSCULAR | Status: DC | PRN

## 2014-09-05 MED ORDER — SODIUM CHLORIDE 0.9 % IJ SOLN
9.0000 mL | INTRAMUSCULAR | Status: DC | PRN
  Administered 2014-09-06: 10 mL via INTRAVENOUS
  Filled 2014-09-05: qty 9

## 2014-09-05 MED ORDER — MIRABEGRON ER 50 MG PO TB24
50.0000 mg | ORAL_TABLET | Freq: Every evening | ORAL | Status: DC
  Administered 2014-09-05: 50 mg via ORAL
  Filled 2014-09-05 (×3): qty 1

## 2014-09-05 MED ORDER — ONE-DAILY MULTI VITAMINS PO TABS: 1.0000 | ORAL_TABLET | Freq: Every day | ORAL | Status: DC

## 2014-09-05 MED ORDER — MORPHINE SULFATE ER 15 MG PO TBCR
15.0000 mg | EXTENDED_RELEASE_TABLET | Freq: Two times a day (BID) | ORAL | Status: DC
  Administered 2014-09-05 – 2014-09-06 (×2): 15 mg via ORAL
  Filled 2014-09-05 (×2): qty 1

## 2014-09-05 MED ORDER — FENTANYL CITRATE (PF) 100 MCG/2ML IJ SOLN
INTRAMUSCULAR | Status: AC | PRN
  Administered 2014-09-05 (×3): 25 ug via INTRAVENOUS

## 2014-09-05 MED ORDER — SODIUM CHLORIDE 0.9 % IV SOLN
INTRAVENOUS | Status: DC
  Administered 2014-09-05: 14:00:00 via INTRAVENOUS

## 2014-09-05 MED ORDER — DIPHENHYDRAMINE HCL 12.5 MG/5ML PO ELIX: 12.5000 mg | ORAL_SOLUTION | Freq: Four times a day (QID) | ORAL | Status: DC | PRN

## 2014-09-05 MED ORDER — LIDOCAINE HCL 1 % IJ SOLN
INTRAMUSCULAR | Status: AC
Start: 2014-09-05 — End: 2014-09-06
  Filled 2014-09-05: qty 20

## 2014-09-05 MED ORDER — FOLIVANE-PLUS PO CAPS: 1.0000 | ORAL_CAPSULE | Freq: Every day | ORAL | Status: DC

## 2014-09-05 MED ORDER — PANTOPRAZOLE SODIUM 40 MG PO TBEC
40.0000 mg | DELAYED_RELEASE_TABLET | Freq: Every day | ORAL | Status: DC
  Administered 2014-09-06: 40 mg via ORAL
  Filled 2014-09-05: qty 1

## 2014-09-05 MED ORDER — NALOXONE HCL 0.4 MG/ML IJ SOLN: 0.4000 mg | INTRAMUSCULAR | Status: DC | PRN

## 2014-09-05 MED ORDER — BOOST / RESOURCE BREEZE PO LIQD
1.0000 | Freq: Three times a day (TID) | ORAL | Status: DC
  Administered 2014-09-05: 1 via ORAL

## 2014-09-05 MED ORDER — MORPHINE SULFATE 2 MG/ML IJ SOLN
INTRAMUSCULAR | Status: AC
  Filled 2014-09-05: qty 2

## 2014-09-05 NOTE — H&P (Signed)
Patient ID: [name], male   DOB: October 03, 1945, 69 y.o.   MRN: [id number]    Referring Physician(s): Hung,P Subjective: Patient familiar to IR service from prior PTC with internal/external biliary drain catheter placement on 08/12/14. Patient has known history of metastatic non-small cell lung cancer diagnosed  initially in September 2010.He presented recently with biliary obstruction secondary to a peripancreatic mass and inability to place a biliary stent via ERCP. 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 He presents today for follow-up cholangiogram and possible biliary stent placement. Patient is currently asymptomatic.    Allergies: Augmentin; Carboplatin; Levaquin; Percocet; and Zolpidem tartrate  Medications: Prior to Admission medications   Medication Sig Start Date End Date Taking? Authorizing Provider  Ascorbic Acid (VITAMIN C) 1000 MG tablet Take 1,000 mg by mouth daily.   Yes Historical Provider, MD  atenolol (TENORMIN) 100 MG tablet Take 100 mg by mouth every morning.    Yes Historical Provider, MD  FeFum-FePoly-FA-B Cmp-C-Biot (FOLIVANE-PLUS) CAPS Take 1 capsule by mouth daily. 11/13/13  Yes [name], MD  HYDROmorphone (DILAUDID) 4 MG tablet Take 1 tablet (4 mg total) by mouth every 6 (six) hours as needed for moderate pain or severe pain. 08/19/14  Yes [name], MD  morphine (MS CONTIN) 15 MG 12 hr tablet Take 1 tablet (15 mg total) by mouth every 12 (twelve) hours. 09/02/14  Yes Carlton Adam, PA-C  Multiple Vitamin (MULTIVITAMIN) tablet Take 1 tablet by mouth daily.    Yes Historical Provider, MD  MYRBETRIQ 50 MG TB24 tablet Take 50 mg by mouth every evening.  01/09/14  Yes Historical Provider, MD  omeprazole (PRILOSEC) 40 MG capsule Take 1 capsule (40 mg total) by mouth every evening. Patient taking differently: Take 40 mg by mouth every morning.  11/13/13  Yes [name], MD  prochlorperazine (COMPAZINE)  10 MG tablet Take 1 tablet (10 mg total) by mouth every 6 (six) hours as needed. Patient taking differently: Take 10 mg by mouth every 6 (six) hours as needed for nausea or vomiting.  08/27/14  Yes [name], MD  Sennosides-Docusate Sodium (SENOKOT S PO) Take 2 tablets by mouth daily as needed (constipation).    Yes Historical Provider, MD  diphenhydrAMINE (BENADRYL) 25 MG tablet Take 1 tablet (25 mg total) by mouth every 6 (six) hours as needed for itching. Patient not taking: Reported on 08/27/2014 08/16/14   Modena Jansky, MD  mupirocin cream (BACTROBAN) 2 % Apply 1 application topically 2 (two) times daily. Apply around Percutaneous Biliary drain site. Patient not taking: Reported on 08/27/2014 08/16/14   Modena Jansky, MD  polyethylene glycol (MIRALAX / Floria Raveling) packet Take 17 g by mouth 2 (two) times daily. Patient not taking: Reported on 09/03/2014 08/16/14   Modena Jansky, MD     Vital Signs: BP 148/68 mmHg  Pulse 60  Temp(Src) 97.7 F (36.5 C) (Oral)  Resp 18  SpO2 100%  Physical Exam patient awake, alert, jaundiced. Chest with distant breath sounds bilaterally,  no wheezes ;  heart with regular rate and rhythm. Abdomen soft, positive bowel sounds, nontender, intact right upper quadrant internal/external biliary drain; extremities with full range of motion and no edema   Imaging: No results found.  Labs:  CBC:  Recent Labs  08/16/14 0635 08/23/14 1045 08/25/14 0123 09/05/14 1330  WBC 8.1 9.1 7.7 4.7  HGB 9.4* 10.5* 10.0* 9.4*  HCT 27.9* 32.6* 30.8* 29.2*  PLT 220 303 194 PENDING  COAGS:  Recent Labs  06/03/14 1335 08/07/14 0530 08/23/14 1045 09/05/14 1330  INR 1.17 1.04 1.33 1.32  APTT 37  --   --  30    BMP:  Recent Labs  08/16/14 0635 08/23/14 1045 08/25/14 0123 09/05/14 1330  NA 142 139 140 143  K 3.7 4.2 4.0 3.7  CL 109 104 108 108  CO2 24 23 23 24   GLUCOSE 113* 159* 122* 118*  BUN 20 51* 34* 13  CALCIUM 9.2 9.6 9.4 9.1    CREATININE 1.29* 2.06* 1.56* 1.14  GFRNONAA 55* 31* 44* >60  GFRAA >60 36* 51* >60    LIVER FUNCTION TESTS:  Recent Labs  08/16/14 0635 08/23/14 1045 08/25/14 0123 09/05/14 1330  BILITOT 9.2* 9.3* 8.1* 7.0*  AST 76* 99* 78* 96*  ALT 104* 152* 132* 122*  ALKPHOS 413* 339* 287* 188*  PROT 7.1 7.8 7.1 6.9  ALBUMIN 2.9* 3.3* 3.1* 2.9*    Assessment and Plan: Patient familiar to IR service from prior PTC with internal/external biliary drain catheter placement on 08/12/14. Patient has known history of metastatic non-small cell lung cancer diagnosed  initially in September 2010.He presented recently with biliary obstruction secondary to a peripancreatic mass and inability to place a biliary stent via ERCP. 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 He presents today for follow-up cholangiogram and possible biliary stent placement. Patient is currently asymptomatic. Details/risks of procedure, including but not limited to, internal bleeding, infection, inability to place stent and need for prolonged external drainage discussed with patient and wife with their understanding and consent.    Signed: D. Lennette Bihari Allred 09/05/2014, 2:16 PM  15 minutes were spent evaluating patient for cholangiogram and possible biliary stent placement

## 2014-09-05 NOTE — Progress Notes (Addendum)
Pt here today for biliary stent placement in interventional radiology.  Procedure complete at 1535 and recovery started in short stay.  Pt c/o pain 7/10 at 1629, gave 1 vicoden. Pt c/o pain worse now, 10/10 to rt flank.  Pt states I can't breathe.  o2 sat. 100% room air, hr-70s.  bp elevated, 170s/110s.  Pt was able to void at this time.  But still bent over in pain.  Dr. Vernard Gambles notified at this time.

## 2014-09-05 NOTE — Progress Notes (Signed)
Pt and his wife were notified that he would be admitted to [facility], 1329.  Informed pt that [place] would be transferring him and she was his nurse now. Pt and his wife voiced understanding. Pt states his pain is 7/10.

## 2014-09-05 NOTE — Progress Notes (Signed)
Called bed control to see if a bed request had been made by radiology.  It had not, so made request and gave information.  [address] was assigned to pt.at this time.

## 2014-09-05 NOTE — Progress Notes (Signed)
Kris Mouton RN handoff report about pt.

## 2014-09-05 NOTE — Progress Notes (Signed)
Dr. Vernard Gambles at bedside.  Ordered now dose of morphine IV '4mg'$ .  Dr. Vernard Gambles assessed pt at this time.  Ordered for pt to be observed over night, he states he will put orders in.

## 2014-09-05 NOTE — Procedures (Signed)
Internal bili stent placement 10x60, removal int/ext drain No complication No blood loss. See complete dictation in [facility].

## 2014-09-05 NOTE — Discharge Instructions (Signed)
Conscious Sedation °Sedation is the use of medicines to promote relaxation and relieve discomfort and anxiety. Conscious sedation is a type of sedation. Under conscious sedation you are less alert than normal but are still able to respond to instructions or stimulation. Conscious sedation is used during short medical and dental procedures. It is milder than deep sedation or general anesthesia and allows you to return to your regular activities sooner.  °LET YOUR HEALTH CARE PROVIDER KNOW ABOUT:  °· Any allergies you have. °· All medicines you are taking, including vitamins, herbs, eye drops, creams, and over-the-counter medicines. °· Use of steroids (by mouth or creams). °· Previous problems you or members of your family have had with the use of anesthetics. °· Any blood disorders you have. °· Previous surgeries you have had. °· Medical conditions you have. °· Possibility of pregnancy, if this applies. °· Use of cigarettes, alcohol, or illegal drugs. °RISKS AND COMPLICATIONS °Generally, this is a safe procedure. However, as with any procedure, problems can occur. Possible problems include: °· Oversedation. °· Trouble breathing on your own. You may need to have a breathing tube until you are awake and breathing on your own. °· Allergic reaction to any of the medicines used for the procedure. °BEFORE THE PROCEDURE °· You may have blood tests done. These tests can help show how well your kidneys and liver are working. They can also show how well your blood clots. °· A physical exam will be done.   °· Only take medicines as directed by your health care provider. You may need to stop taking medicines (such as blood thinners, aspirin, or nonsteroidal anti-inflammatory drugs) before the procedure.   °· Do not eat or drink at least 6 hours before the procedure or as directed by your health care provider. °· Arrange for a responsible adult, family member, or friend to take you home after the procedure. He or she should stay  with you for at least 24 hours after the procedure, until the medicine has worn off. °PROCEDURE  °· An intravenous (IV) catheter will be inserted into one of your veins. Medicine will be able to flow directly into your body through this catheter. You may be given medicine through this tube to help prevent pain and help you relax. °· The medical or dental procedure will be done. °AFTER THE PROCEDURE °· You will stay in a recovery area until the medicine has worn off. Your blood pressure and pulse will be checked.   °·  Depending on the procedure you had, you may be allowed to go home when you can tolerate liquids and your pain is under control. °Document Released: 12/08/2000 Document Revised: 03/20/2013 Document Reviewed: 11/20/2012 °[employer]® Patient Information ©2015 [employer]. This information is not intended to replace advice given to you by your health care provider. Make sure you discuss any questions you have with your health care provider. ° °

## 2014-09-06 DIAGNOSIS — K831 Obstruction of bile duct: Secondary | ICD-10-CM | POA: Diagnosis not present

## 2014-09-06 LAB — COMPREHENSIVE METABOLIC PANEL
ALBUMIN: 2.4 g/dL — AB (ref 3.5–5.0)
ALT: 112 U/L — ABNORMAL HIGH (ref 17–63)
AST: 90 U/L — ABNORMAL HIGH (ref 15–41)
Alkaline Phosphatase: 188 U/L — ABNORMAL HIGH (ref 38–126)
Anion gap: 6 (ref 5–15)
BILIRUBIN TOTAL: 7.4 mg/dL — AB (ref 0.3–1.2)
BUN: 11 mg/dL (ref 6–20)
CHLORIDE: 110 mmol/L (ref 101–111)
CO2: 26 mmol/L (ref 22–32)
Calcium: 8.5 mg/dL — ABNORMAL LOW (ref 8.9–10.3)
Creatinine, Ser: 1.06 mg/dL (ref 0.61–1.24)
GFR calc Af Amer: >60 mL/min (ref >60)
Glucose, Bld: 115 mg/dL — ABNORMAL HIGH (ref 65–99)
POTASSIUM: 3.4 mmol/L — AB (ref 3.5–5.1)
Sodium: 142 mmol/L (ref 135–145)
Total Protein: 5.7 g/dL — ABNORMAL LOW (ref 6.5–8.1)

## 2014-09-06 LAB — CBC
HEMATOCRIT: 26.2 % — AB (ref 39.0–52.0)
HEMOGLOBIN: 8.6 g/dL — AB (ref 13.0–17.0)
MCH: 31.4 pg (ref 26.0–34.0)
MCHC: 32.8 g/dL (ref 30.0–36.0)
MCV: 95.6 fL (ref 78.0–100.0)
Platelets: 90 10*3/uL — ABNORMAL LOW (ref 150–400)
RBC: 2.74 MIL/uL — AB (ref 4.22–5.81)
RDW: 16.2 % — ABNORMAL HIGH (ref 11.5–15.5)
WBC: 7.5 10*3/uL (ref 4.0–10.5)

## 2014-09-06 MED ORDER — HEPARIN SOD (PORK) LOCK FLUSH 100 UNIT/ML IV SOLN
500.0000 [IU] | INTRAVENOUS | Status: DC | PRN
Start: 2014-09-06 — End: 2014-09-06
  Filled 2014-09-06: qty 5

## 2014-09-06 NOTE — Discharge Summary (Signed)
Patient ID: [name] MRN: [id number] DOB/AGE: [id number] y.o.  Admit date: 09/05/2014 Discharge date: 09/06/2014  Admission Diagnoses: Metastatic lung cancer, biliary obstruction  Discharge Diagnoses: Metastatic lung cancer, biliary obstruction, status post wall flex stent placement across distal common bile duct obstruction on 09/05/14 Active Problems:   Biliary obstruction  Past Medical History  Diagnosis Date  . Hypertension   . Arthritis RIGHT SHOULDER  . Immature cataract BILATERAL  . Non-small cell lung cancer DX SEPT 2010  W/ CHEMORADIATION AT THAT TIME -- NOW  W/ METS--  CURRENTLY ON MAINTENANCE CHEMO TX  EVERY 30 DAYS    ONCOLOGIST- DR [facility]  . Acute meniscal tear of knee RIGHT KNEE  . BPH (benign prostatic hypertrophy)   . Dizziness and giddiness 04/23/2014  . Tremor 04/23/2014    Right hand  . HOH (hard of hearing)     bilateral hearing aids  . Radiation 01/08/09-01/21/09    Mediastinum 30 Gy x 12 fractions   Past Surgical History  Procedure Laterality Date  . 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 Rotator cuff repair  2008    LEFT SHOULDER  . Bilateral ear drum surgery  1960'S  . Orif left ring finger  and revascularization of radial side  02-16-2007    DEGLOVING INJURY  . Knee arthroscopy  12/21/2011    Procedure: ARTHROSCOPY KNEE;  Surgeon: Tobi Bastos, MD;  Location: [facility];  Service: Orthopedics;  Laterality: Right;  WITH MEDIAL MENISECTOMY  . Cataract extraction Bilateral   . Tonsillectomy      as child  . Ovarian cyst surgery      removed from left jaw area- benign  . Ovarian cyst surgery       NOTE; pt unaware of why this is in his chart  . Shoulder open rotator cuff repair Right 06/05/2014    Procedure: RIGHT ROTATOR CUFF REPAIR SHOULDER OPEN;  Surgeon: Latanya Maudlin, MD;  Location: [facility];  Service: Orthopedics;  Laterality: Right;  . Ercp  N/A 08/09/2014    Procedure: ENDOSCOPIC RETROGRADE CHOLANGIOPANCREATOGRAPHY (ERCP);  Surgeon: Carol Ada, MD;  Location: [facility];  Service: Endoscopy;  Laterality: N/A;  . Eus N/A 08/09/2014    Procedure: UPPER ENDOSCOPIC ULTRASOUND (EUS) RADIAL;  Surgeon: Carol Ada, MD;  Location: [facility];  Service: Endoscopy;  Laterality: N/A;  . Fine needle aspiration N/A 08/09/2014    Procedure: FINE NEEDLE ASPIRATION (FNA) LINEAR;  Surgeon: Carol Ada, MD;  Location: [facility];  Service: Endoscopy;  Laterality: N/A;  . Esophagogastroduodenoscopy (egd) with propofol N/A 08/09/2014    Procedure: ESOPHAGOGASTRODUODENOSCOPY (EGD) WITH PROPOFOL;  Surgeon: Carol Ada, MD;  Location: [facility];  Service: Endoscopy;  Laterality: N/A;     Discharged Condition: good  Hospital Course: Mr. [name] is a 69 year old white male, patient of Dr. [name], with known history of metastatic non-small cell lung cancer diagnosed initially in September 2010. He presented recently with biliary obstruction secondary to a peripancreatic mass and inability to place a biliary stent via ERCP. 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 He underwent PTC with placement of an external/internal biliary drain catheter on 08/12/14 and returned on 09/05/14 for follow-up cholangiogram with stent placement. A 10 x 60 wall flex stent was placed across the distal common bile duct obstruction and the patient's external drain was removed. Prior to discharge the patient began to  complain of moderate to severe right upper quadrant pain. Vital signs were stable and he was afebrile. Secondary to persistent pain the patient was admitted for overnight observation and placed on morphine PCA pump. He did fairly well overnight with exception of some mild nausea. By the morning of discharge he was doing well. He was able to tolerate his diet without difficulty. He denied acute chest pain,  dyspnea, nausea, vomiting or significant abdominal pain. Follow-up laboratory values were stable and listed as below The patient was seen by Dr. Kathlene Cote and deemed stable for discharge at this time. He has an appointment to follow-up with Dr. Inda Merlin on 6/13 for chemotherapy. He will continue his current home medications.  Consults: none  Significant Diagnostic Studies:  Results for orders placed or performed during the hospital encounter of 09/05/14  APTT  Result Value Ref Range   aPTT 30 24 - 37 seconds  CBC with Differential/Platelet  Result Value Ref Range   WBC 4.7 4.0 - 10.5 K/uL   RBC 3.04 (L) 4.22 - 5.81 MIL/uL   Hemoglobin 9.4 (L) 13.0 - 17.0 g/dL   HCT 29.2 (L) 39.0 - 52.0 %   MCV 96.1 78.0 - 100.0 fL   MCH 30.9 26.0 - 34.0 pg   MCHC 32.2 30.0 - 36.0 g/dL   RDW 16.2 (H) 11.5 - 15.5 %   Platelets 111 (L) 150 - 400 K/uL   Neutrophils Relative % 80 (H) 43 - 77 %   Neutro Abs 3.8 1.7 - 7.7 K/uL   Lymphocytes Relative 5 (L) 12 - 46 %   Lymphs Abs 0.2 (L) 0.7 - 4.0 K/uL   Monocytes Relative 14 (H) 3 - 12 %   Monocytes Absolute 0.6 0.1 - 1.0 K/uL   Eosinophils Relative 1 0 - 5 %   Eosinophils Absolute 0.0 0.0 - 0.7 K/uL   Basophils Relative 0 0 - 1 %   Basophils Absolute 0.0 0.0 - 0.1 K/uL  Comprehensive metabolic panel  Result Value Ref Range   Sodium 143 135 - 145 mmol/L   Potassium 3.7 3.5 - 5.1 mmol/L   Chloride 108 101 - 111 mmol/L   CO2 24 22 - 32 mmol/L   Glucose, Bld 118 (H) 65 - 99 mg/dL   BUN 13 6 - 20 mg/dL   Creatinine, Ser 1.14 0.61 - 1.24 mg/dL   Calcium 9.1 8.9 - 10.3 mg/dL   Total Protein 6.9 6.5 - 8.1 g/dL   Albumin 2.9 (L) 3.5 - 5.0 g/dL   AST 96 (H) 15 - 41 U/L   ALT 122 (H) 17 - 63 U/L   Alkaline Phosphatase 188 (H) 38 - 126 U/L   Total Bilirubin 7.0 (H) 0.3 - 1.2 mg/dL   GFR calc non Af Amer >60 >60 mL/min   GFR calc Af Amer >60 >60 mL/min   Anion gap 11 5 - 15  Protime-INR  Result Value Ref Range   Prothrombin Time 16.5 (H) 11.6 - 15.2  seconds   INR 1.32 0.00 - 1.49  CBC  Result Value Ref Range   WBC 7.5 4.0 - 10.5 K/uL   RBC 2.74 (L) 4.22 - 5.81 MIL/uL   Hemoglobin 8.6 (L) 13.0 - 17.0 g/dL   HCT 26.2 (L) 39.0 - 52.0 %   MCV 95.6 78.0 - 100.0 fL   MCH 31.4 26.0 - 34.0 pg   MCHC 32.8 30.0 - 36.0 g/dL   RDW 16.2 (H) 11.5 - 15.5 %   Platelets 90 (L)  150 - 400 K/uL  Comprehensive metabolic panel  Result Value Ref Range   Sodium 142 135 - 145 mmol/L   Potassium 3.4 (L) 3.5 - 5.1 mmol/L   Chloride 110 101 - 111 mmol/L   CO2 26 22 - 32 mmol/L   Glucose, Bld 115 (H) 65 - 99 mg/dL   BUN 11 6 - 20 mg/dL   Creatinine, Ser 1.06 0.61 - 1.24 mg/dL   Calcium 8.5 (L) 8.9 - 10.3 mg/dL   Total Protein 5.7 (L) 6.5 - 8.1 g/dL   Albumin 2.4 (L) 3.5 - 5.0 g/dL   AST 90 (H) 15 - 41 U/L   ALT 112 (H) 17 - 63 U/L   Alkaline Phosphatase 188 (H) 38 - 126 U/L   Total Bilirubin 7.4 (H) 0.3 - 1.2 mg/dL   GFR calc non Af Amer >60 >60 mL/min   GFR calc Af Amer >60 >60 mL/min   Anion gap 6 5 - 15     Treatments: Ir Biliary Stent(s) Existing Access Inc Dilation Cath Exchange  09/05/2014   INDICATION: Common duct obstruction secondary arm metastatic adenopathy, status post internal external biliary drain catheter placement and radiation therapy.  EXAM: CHOLANGIOGRAM THROUGH EXISTING CATHETER  FLUOROSCOPIC GUIDED PERCUTANEOUS INTERNAL BILIARY STENT PLACEMENT  COMPARISON:  08/12/2014  MEDICATIONS: LIDOCAINE 1% SUBCUTANEOUS  CONTRAST:  39m OMNIPAQUE IOHEXOL 300 MG/ML  SOLN  ANESTHESIA/SEDATION: Intravenous Fentanyl and Versed were administered as conscious sedation during continuous cardiorespiratory monitoring by the radiology RN, with a total moderate sedation time of 18 minutes.  FLUOROSCOPY TIME:  3 MINUTES 36 SECONDS, 43 MGY  COMPLICATIONS: None immediate  TECHNIQUE: Informed written consent was obtained from [place] a discussion of the risks, benefits and alternatives to treatment. Questions regarding the procedure were  encouraged and answered. A timeout was performed prior to the initiation of the procedure.  Contrast was injected through the previously placed internal external biliary drainage catheter.  Catheter and surrounding skin were then prepped and draped in usual sterile fashion. Maximum barrier sterile technique with sterile gowns and gloves were used for the procedure. A timeout was performed prior to the initiation of the procedure.  The catheter was cut and exchanged over a Bentson wire for a 9 [place] sheath for additional cholangiographic images. A 5 [place] the catheter was advanced to the ligament Treitz. The length of the residual CBD obstruction was measured. An Amplatz wire was placed. Over this, a 10 mm x 60 mm wall flex stent was deployed across the CBD obstruction. Contrast injection was performed. The sheath was removed over a guidewire, and once it was clear that hemostasis had been achieved, the guidewire removed. Site was dressed with a sterile dressing.  The patient tolerated the procedure well without immediate postprocedural complication.  FINDINGS: Injection of the internal external drain demonstrated decompression of the biliary tree and good flow into the duodenum. Additional cholangiographic images after removal of the stent showed Residual long tapered narrowing of the mid and distal CBD, incompletely occlusive. Because of his persistently elevated biliRubin and long residual narrowing of the CBD, it was thought prudent to go ahead open this segment up completely, so the 10 x 60 wall flex stent was deployed. Post deployment imaging shows excellent flow through the length of the CBD without residual narrowing or obstruction. The intrahepatic biliary tree remains decompressed. Cystic duct is patent. Percutaneous access was therefore removed.  IMPRESSION: 1. Persistent narrowing in the mid and distal CBD. 2. 10 x 60  Wallflex stent placement across distal CBD obstruction, and removal of  percutaneous access.   Electronically Signed   By: Lucrezia Europe M.D.   On: 09/05/2014 16:14     Discharge Exam: Blood pressure 120/60, pulse 66, temperature 98.4 F (36.9 C), temperature source Oral, resp. rate 14, height _0  (1.727 m), weight 118 lb (53.524 kg), SpO2 94 %. Patient is awake, alert. Jaundiced. Chest with distant breath sounds bilaterally, no wheezes. Heart with regular rate and rhythm. Abdomen soft, positive bowel sounds, minimal tenderness at right upper quadrant puncture site, Tegaderm dressing intact; extremities with full range of motion and no edema.  Disposition: home  Discharge Instructions    Call MD for:  difficulty breathing, headache or visual disturbances    Complete by:  As directed      Call MD for:  extreme fatigue    Complete by:  As directed      Call MD for:  hives    Complete by:  As directed      Call MD for:  persistant dizziness or light-headedness    Complete by:  As directed      Call MD for:  persistant nausea and vomiting    Complete by:  As directed      Call MD for:  redness, tenderness, or signs of infection (pain, swelling, redness, odor or green/yellow discharge around incision site)    Complete by:  As directed      Call MD for:  severe uncontrolled pain    Complete by:  As directed      Call MD for:  temperature >100.4    Complete by:  As directed      Change dressing (specify)    Complete by:  As directed   Keep puncture site right upper abdomen clean and dry. Keep area covered with dressing and change daily . Avoid sitting in bathtub until puncture site is completely healed and closed.     Diet - low sodium heart healthy    Complete by:  As directed      Driving Restrictions    Complete by:  As directed   No driving for next 24 hours     Increase activity slowly    Complete by:  As directed      Lifting restrictions    Complete by:  As directed   No heavy lifting for the next 3-4 days     May shower / Bathe    Complete by:   As directed      May walk up steps    Complete by:  As directed             Medication List    STOP taking these medications        diphenhydrAMINE 25 MG tablet  Commonly known as:  BENADRYL     mupirocin cream 2 %  Commonly known as:  BACTROBAN     polyethylene glycol packet  Commonly known as:  MIRALAX / GLYCOLAX      TAKE these medications        atenolol 100 MG tablet  Commonly known as:  TENORMIN  Take 100 mg by mouth every morning.     FOLIVANE-PLUS Caps  Take 1 capsule by mouth daily.     HYDROmorphone 4 MG tablet  Commonly known as:  DILAUDID  Take 1 tablet (4 mg total) by mouth every 6 (six) hours as needed for moderate pain or severe pain.  morphine 15 MG 12 hr tablet  Commonly known as:  MS CONTIN  Take 1 tablet (15 mg total) by mouth every 12 (twelve) hours.     multivitamin tablet  Take 1 tablet by mouth daily.     MYRBETRIQ 50 MG Tb24 tablet  Generic drug:  mirabegron ER  Take 50 mg by mouth every evening.     omeprazole 40 MG capsule  Commonly known as:  PRILOSEC  Take 1 capsule (40 mg total) by mouth every evening.     prochlorperazine 10 MG tablet  Commonly known as:  COMPAZINE  Take 1 tablet (10 mg total) by mouth every 6 (six) hours as needed.     SENOKOT S PO  Take 2 tablets by mouth daily as needed (constipation).     vitamin C 1000 MG tablet  Take 1,000 mg by mouth daily.           Follow-up Information    Follow up with Eilleen Kempf., MD.   Specialty:  Oncology   Why:  Follow-up with Dr. [name] on 6/13 as previously scheduled   Contact information:   [place] [address] [phone]       I have spent (less than 30 minutes) coordinating discharge for Raynelle Chary.    SignedRowe Robert, PAC        09/06/2014, 10:18 AM

## 2014-09-08 NOTE — Progress Notes (Signed)
  Radiation Oncology         [phone] ________________________________  Name: [name] MRN: [id number]  Date: 09/04/2014  DOB: [birth year]  End of Treatment Note  Diagnosis:   Bronchogenic cancer of left lung   Staging form: Lung, AJCC 7th Edition     Clinical: Metastatic non-small cell lung cancer, adenocarcinoma with negative EGFR mutation and negative ALK gene translocation - Signed by [name], MD on 05/15/2013     Pathologic: No stage assigned - Unsigned  Indication for treatment:  Palliative      Radiation treatment dates:   08/19/2014-09/04/2014  Site/dose:   Porta hepatic lymph nodes/ 30 Gy in 10 fractions  Beams/energy:   30 Gy in 12 fractions at 2.5 Gy per fraction with 6 MV photons via a 3D conformal plan delivered with Tomotherapy.   Narrative: The patient tolerated radiation treatment relatively well.   His abdominal pain was getting better and his drain was putting out less bile at the end of his treatment.   Plan: The patient has completed radiation treatment. The patient will return to radiation oncology clinic for routine followup in one month. I advised them to call or return sooner if they have any questions or concerns related to their recovery or treatment.  ------------------------------------------------  Thea Silversmith, MD

## 2014-09-09 ENCOUNTER — Ambulatory Visit (HOSPITAL_COMMUNITY)
Admission: RE | Admit: 2014-09-09 | Discharge: 2014-09-09 | Disposition: A | Discharge status: Home / Self Care | Payer: Medicare Other | Source: Ambulatory Visit | Attending: Physician Assistant | Admitting: Physician Assistant

## 2014-09-09 ENCOUNTER — Encounter (HOSPITAL_COMMUNITY): Payer: Self-pay

## 2014-09-09 ENCOUNTER — Ambulatory Visit (HOSPITAL_BASED_OUTPATIENT_CLINIC_OR_DEPARTMENT_OTHER): Payer: Medicare Other | Admitting: Physician Assistant

## 2014-09-09 ENCOUNTER — Other Ambulatory Visit (HOSPITAL_BASED_OUTPATIENT_CLINIC_OR_DEPARTMENT_OTHER): Payer: Medicare Other

## 2014-09-09 ENCOUNTER — Ambulatory Visit: Payer: Medicare Other

## 2014-09-09 ENCOUNTER — Encounter: Payer: Self-pay | Admitting: Physician Assistant

## 2014-09-09 VITALS — BP 109/69 | HR 71 | Temp 97.8°F | Resp 17 | Ht 68.0 in | Wt 121.4 lb

## 2014-09-09 DIAGNOSIS — J90 Pleural effusion, not elsewhere classified: Secondary | ICD-10-CM | POA: Diagnosis not present

## 2014-09-09 DIAGNOSIS — C3492 Malignant neoplasm of unspecified part of left bronchus or lung: Secondary | ICD-10-CM

## 2014-09-09 DIAGNOSIS — C3411 Malignant neoplasm of upper lobe, right bronchus or lung: Secondary | ICD-10-CM | POA: Diagnosis present

## 2014-09-09 DIAGNOSIS — C772 Secondary and unspecified malignant neoplasm of intra-abdominal lymph nodes: Secondary | ICD-10-CM | POA: Insufficient documentation

## 2014-09-09 DIAGNOSIS — I714 Abdominal aortic aneurysm, without rupture: Secondary | ICD-10-CM | POA: Diagnosis not present

## 2014-09-09 DIAGNOSIS — R1084 Generalized abdominal pain: Secondary | ICD-10-CM

## 2014-09-09 DIAGNOSIS — R112 Nausea with vomiting, unspecified: Secondary | ICD-10-CM | POA: Diagnosis not present

## 2014-09-09 DIAGNOSIS — R945 Abnormal results of liver function studies: Secondary | ICD-10-CM

## 2014-09-09 DIAGNOSIS — C801 Malignant (primary) neoplasm, unspecified: Secondary | ICD-10-CM

## 2014-09-09 DIAGNOSIS — K838 Other specified diseases of biliary tract: Secondary | ICD-10-CM | POA: Insufficient documentation

## 2014-09-09 DIAGNOSIS — K831 Obstruction of bile duct: Secondary | ICD-10-CM

## 2014-09-09 DIAGNOSIS — Z08 Encounter for follow-up examination after completed treatment for malignant neoplasm: Secondary | ICD-10-CM | POA: Diagnosis not present

## 2014-09-09 DIAGNOSIS — C7802 Secondary malignant neoplasm of left lung: Secondary | ICD-10-CM | POA: Diagnosis not present

## 2014-09-09 DIAGNOSIS — R5382 Chronic fatigue, unspecified: Secondary | ICD-10-CM

## 2014-09-09 DIAGNOSIS — D61810 Antineoplastic chemotherapy induced pancytopenia: Secondary | ICD-10-CM

## 2014-09-09 DIAGNOSIS — R7989 Other specified abnormal findings of blood chemistry: Secondary | ICD-10-CM

## 2014-09-09 DIAGNOSIS — R1900 Intra-abdominal and pelvic swelling, mass and lump, unspecified site: Secondary | ICD-10-CM

## 2014-09-09 LAB — COMPREHENSIVE METABOLIC PANEL (CC13)
ALBUMIN: 2.1 g/dL — AB (ref 3.5–5.0)
ALK PHOS: 434 U/L — AB (ref 40–150)
ALT: 154 U/L — AB (ref 0–55)
AST: 156 U/L — ABNORMAL HIGH (ref 5–34)
Anion Gap: 9 mEq/L (ref 3–11)
BUN: 12.2 mg/dL (ref 7.0–26.0)
CO2: 24 meq/L (ref 22–29)
Calcium: 8.4 mg/dL (ref 8.4–10.4)
Chloride: 109 mEq/L (ref 98–109)
Creatinine: 1 mg/dL (ref 0.7–1.3)
EGFR: 73 mL/min/{1.73_m2} — ABNORMAL LOW (ref >90)
GLUCOSE: 133 mg/dL (ref 70–140)
POTASSIUM: 3.7 meq/L (ref 3.5–5.1)
Sodium: 142 mEq/L (ref 136–145)
TOTAL PROTEIN: 5.9 g/dL — AB (ref 6.4–8.3)
Total Bilirubin: 8.93 mg/dL (ref 0.20–1.20)

## 2014-09-09 LAB — CBC WITH DIFFERENTIAL/PLATELET
BASO%: 0.3 % (ref 0.0–2.0)
Basophils Absolute: 0 10*3/uL (ref 0.0–0.1)
EOS ABS: 0.1 10*3/uL (ref 0.0–0.5)
EOS%: 1.1 % (ref 0.0–7.0)
HEMATOCRIT: 27.9 % — AB (ref 38.4–49.9)
HGB: 9.2 g/dL — ABNORMAL LOW (ref 13.0–17.1)
LYMPH%: 3.1 % — AB (ref 14.0–49.0)
MCH: 31.2 pg (ref 27.2–33.4)
MCHC: 33.1 g/dL (ref 32.0–36.0)
MCV: 94.3 fL (ref 79.3–98.0)
MONO#: 0.7 10*3/uL (ref 0.1–0.9)
MONO%: 10.1 % (ref 0.0–14.0)
NEUT#: 5.7 10*3/uL (ref 1.5–6.5)
NEUT%: 85.4 % — AB (ref 39.0–75.0)
PLATELETS: 145 10*3/uL (ref 140–400)
RBC: 2.95 10*6/uL — AB (ref 4.20–5.82)
RDW: 16.8 % — ABNORMAL HIGH (ref 11.0–14.6)
WBC: 6.6 10*3/uL (ref 4.0–10.3)
lymph#: 0.2 10*3/uL — ABNORMAL LOW (ref 0.9–3.3)

## 2014-09-09 LAB — TSH CHCC: TSH: 1.473 m[IU]/L (ref 0.320–4.118)

## 2014-09-09 MED ORDER — IOHEXOL 300 MG/ML  SOLN
25.0000 mL | Freq: Once | INTRAMUSCULAR | Status: AC | PRN
  Administered 2014-09-09: 25 mL via ORAL

## 2014-09-09 MED ORDER — IOHEXOL 300 MG/ML  SOLN
100.0000 mL | Freq: Once | INTRAMUSCULAR | Status: AC | PRN
  Administered 2014-09-09: 75 mL via INTRAVENOUS

## 2014-09-09 NOTE — Progress Notes (Addendum)
[place] [phone]   Fax:([phone]) [place], MD [address] [place] [address]  DIAGNOSIS AND DIAGNOSIS: Metastatic non-small cell lung cancer adenocarcinoma diagnosed in September 2010   PRIOR THERAPY:  #1 status post 6 cycles of systemic chemotherapy with carboplatin, Alimta and Avastin given every 3 weeks last dose given 04/23/2009 with disease stabilization.  #2 status post palliative radiotherapy to the mediastinum under the care of Dr. Pablo Ledger. The patient received a total dose of 3000 cGY and 12 fractions completed 01/21/2009.  #3 Maintenance chemotherapy with Alimta at 500 mg per meter squared and Avastin at 15 mg per kilogram given every 3 weeks status post 35 cycles.  #4 Maintenance chemotherapy with Alimta 500 mg/M2 and Avastin 15 mg/kg every 4 weeks,status post 17 cycles, discontinued secondary to disease progression.  #5 Systemic chemotherapy with carboplatin for AUC of 5, Alimta 500 mg/M2 and Avastin 15 mg/kg every 3 weeks, status post 3 cycles, last cycle was given 12/28/2012. Carboplatin was discontinued starting cycle #2 secondary to hypersensitivity reaction. #6  Maintenance chemotherapy again with Alimta 500 mg/M2 and Avastin 15 mg/kg every 3 weeks, first cycle 01/23/2013. Status post 6 cycles.  CURRENT THERAPY:  1) palliative radiotherapy to the large abdominal mass under the care of Dr. Pablo Ledger. 2) immunotherapy with Nivolumab 3 MG/KG every 2 weeks. First dose expected 09/09/2014  CHEMOTHERAPY INTENT: palliative.  CURRENT # OF CHEMOTHERAPY CYCLES: 1 CURRENT ANTIEMETICS: Compazine when necessary but clearly used.  CURRENT SMOKING STATUS: current nonsmoker  ORAL CHEMOTHERAPY AND CONSENT: None.  CURRENT BISPHOSPHONATES USE: None.  PAIN MANAGEMENT: No Pain  NARCOTICS INDUCED CONSTIPATION: N/A  LIVING WILL AND CODE STATUS: NCB   INTERVAL HISTORY:  [name] 69 y.o. male  returns to the clinic today for follow up visit accompanied by his wife. The patient has been observation for the last 16 months with no significant evidence for disease progression. He was last seen in February 2016 and CT scan of the chest, abdomen and pelvis at that time showed no significant findings for disease progression. Over the last 1-2 weeks the patient has been complaining of bloating as well as abdominal pain. He was seen by his primary care physician and treated with proton pump inhibitor was no improvement. He was referred to Dr. Collene Mares for consideration of upper endoscopy. During the visit he was noted to be jaundiced and repeat comprehensive metabolic panel showed significant elevation of the alkaline phosphatase and liver enzymes. He was admitted to [facility] for further evaluation and management of his condition. CT scan of the chest, abdomen and pelvis performed during his admission showed evidence for disease progression in the chest as well as a large mass between the pancreas and the duodenum. MRI of the abdomen confirmed this finding. He underwent endoscopic ultrasound with biopsy of the large abdominal mass by Dr. Benson Norway. The final pathology was consistent with malignant cells consistent with poorly differentiated carcinoma with suggestion of adenocarcinoma. Dr. [name] requested consultation with Dr. Pablo Ledger for consideration of palliative radiotherapy to the large abdominal mass and the patient is recently completed palliative radiotherapy to that area.  He also had a biliary drain. This was recently removed and a stent was placed under the care of Dr. Milta Deiters. He was was kept overnight due to issues with pain control after the stent placement. Today he continues to complain of some pain related to the stent.  His pain  is also well controlled with the current pain medication.He also presents generally icteric both sclera as well as his overall skin pigmentation.   He denied having  any significant chest pain, cough or hemoptysis. He denied having any nausea or vomiting, no fever or chills. He presents to proceed with his first cycle of immunotherapy with nivolumab.  MEDICAL HISTORY: Past Medical History  Diagnosis Date  . Hypertension   . Arthritis RIGHT SHOULDER  . Immature cataract BILATERAL  . Non-small cell lung cancer DX SEPT 2010  W/ CHEMORADIATION AT THAT TIME -- NOW  W/ METS--  CURRENTLY ON MAINTENANCE CHEMO TX  EVERY 30 DAYS    ONCOLOGIST- DR [facility]  . Acute meniscal tear of knee RIGHT KNEE  . BPH (benign prostatic hypertrophy)   . Dizziness and giddiness 04/23/2014  . Tremor 04/23/2014    Right hand  . HOH (hard of hearing)     bilateral hearing aids  . Radiation 01/08/09-01/21/09    Mediastinum 30 Gy x 12 fractions    ALLERGIES:  is allergic to augmentin; carboplatin; levaquin; percocet; and zolpidem tartrate.  MEDICATIONS:  Current Outpatient Prescriptions  Medication Sig Dispense Refill  . Ascorbic Acid (VITAMIN C) 1000 MG tablet Take 1,000 mg by mouth daily.    Marland Kitchen atenolol (TENORMIN) 100 MG tablet Take 100 mg by mouth every morning.     Marland Kitchen FeFum-FePoly-FA-B Cmp-C-Biot (FOLIVANE-PLUS) CAPS Take 1 capsule by mouth daily. 90 capsule 3  . HYDROmorphone (DILAUDID) 4 MG tablet Take 1 tablet (4 mg total) by mouth every 6 (six) hours as needed for moderate pain or severe pain. 30 tablet 0  . morphine (MS CONTIN) 15 MG 12 hr tablet Take 1 tablet (15 mg total) by mouth every 12 (twelve) hours. 60 tablet 0  . Multiple Vitamin (MULTIVITAMIN) tablet Take 1 tablet by mouth daily.     Marland Kitchen MYRBETRIQ 50 MG TB24 tablet Take 50 mg by mouth every evening.     Marland Kitchen omeprazole (PRILOSEC) 40 MG capsule Take 1 capsule (40 mg total) by mouth every evening. (Patient taking differently: Take 40 mg by mouth every morning. ) 90 capsule 3  . prochlorperazine (COMPAZINE) 10 MG tablet Take 1 tablet (10 mg total) by mouth every 6 (six) hours as needed. (Patient taking differently: Take  10 mg by mouth every 6 (six) hours as needed for nausea or vomiting. ) 30 tablet 1  . Sennosides-Docusate Sodium (SENOKOT S PO) Take 2 tablets by mouth daily as needed (constipation).      No current facility-administered medications for this visit.    SURGICAL HISTORY:  Past Surgical History  Procedure Laterality Date  . 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 Rotator cuff repair  2008    LEFT SHOULDER  . Bilateral ear drum surgery  1960'S  . Orif left ring finger  and revascularization of radial side  02-16-2007    DEGLOVING INJURY  . Knee arthroscopy  12/21/2011    Procedure: ARTHROSCOPY KNEE;  Surgeon: Tobi Bastos, MD;  Location: [facility];  Service: Orthopedics;  Laterality: Right;  WITH MEDIAL MENISECTOMY  . Cataract extraction Bilateral   . Tonsillectomy      as child  . Ovarian cyst surgery      removed from left jaw area- benign  . Ovarian cyst surgery       NOTE; pt unaware of why this is in his chart  .  Shoulder open rotator cuff repair Right 06/05/2014    Procedure: RIGHT ROTATOR CUFF REPAIR SHOULDER OPEN;  Surgeon: Latanya Maudlin, MD;  Location: [facility];  Service: Orthopedics;  Laterality: Right;  . Ercp N/A 08/09/2014    Procedure: ENDOSCOPIC RETROGRADE CHOLANGIOPANCREATOGRAPHY (ERCP);  Surgeon: Carol Ada, MD;  Location: [facility];  Service: Endoscopy;  Laterality: N/A;  . Eus N/A 08/09/2014    Procedure: UPPER ENDOSCOPIC ULTRASOUND (EUS) RADIAL;  Surgeon: Carol Ada, MD;  Location: [facility];  Service: Endoscopy;  Laterality: N/A;  . Fine needle aspiration N/A 08/09/2014    Procedure: FINE NEEDLE ASPIRATION (FNA) LINEAR;  Surgeon: Carol Ada, MD;  Location: [facility];  Service: Endoscopy;  Laterality: N/A;  . Esophagogastroduodenoscopy (egd) with propofol N/A 08/09/2014    Procedure: ESOPHAGOGASTRODUODENOSCOPY (EGD) WITH PROPOFOL;  Surgeon: Carol Ada, MD;  Location:  [facility];  Service: Endoscopy;  Laterality: N/A;    REVIEW OF SYSTEMS:  Constitutional: positive for anorexia, fatigue and weight loss Eyes: negative Ears, nose, mouth, throat, and face: negative Respiratory: negative Cardiovascular: negative Gastrointestinal: negative Genitourinary:negative Integument/breast: negative Hematologic/lymphatic: negative Musculoskeletal:negative Neurological: negative Behavioral/Psych: negative Endocrine: negative Allergic/Immunologic: negative   PHYSICAL EXAMINATION: General appearance: alert, cooperative, fatigued, no distress and Icteric Head: Normocephalic, without obvious abnormality, atraumatic, Sclera are anicteric Neck: no adenopathy, no JVD, supple, symmetrical, trachea midline and thyroid not enlarged, symmetric, no tenderness/mass/nodules Lymph nodes: Cervical, supraclavicular, and axillary nodes normal. Resp: clear to auscultation bilaterally Back: symmetric, no curvature. ROM normal. No CVA tenderness. Cardio: regular rate and rhythm, S1, S2 normal, no murmur, click, rub or gallop GI: soft, non-tender; bowel sounds normal; no masses,  no organomegaly Extremities: extremities normal, atraumatic, no cyanosis or edema Neurologic: Alert and oriented X 3, normal strength and tone. Normal symmetric reflexes. Normal coordination and gait  ECOG PERFORMANCE STATUS: 1 - Symptomatic but completely ambulatory  Blood pressure 109/69, pulse 71, temperature 97.8 F (36.6 C), temperature source Oral, resp. rate 17, height '5\' 8"'$  (1.727 m), weight 121 lb 6.4 oz (55.067 kg), SpO2 99 %.  LABORATORY DATA: Lab Results  Component Value Date   WBC 6.6 09/09/2014   HGB 9.2* 09/09/2014   HCT 27.9* 09/09/2014   MCV 94.3 09/09/2014   PLT 145 09/09/2014      Chemistry      Component Value Date/Time   NA 142 09/09/2014 1307   NA 142 09/06/2014 0720   K 3.7 09/09/2014 1307   K 3.4* 09/06/2014 0720   CL 110 09/06/2014 0720   CL 109* 08/29/2012 0856     CO2 24 09/09/2014 1307   CO2 26 09/06/2014 0720   BUN 12.2 09/09/2014 1307   BUN 11 09/06/2014 0720   CREATININE 1.0 09/09/2014 1307   CREATININE 1.06 09/06/2014 0720      Component Value Date/Time   CALCIUM 8.4 09/09/2014 1307   CALCIUM 8.5* 09/06/2014 0720   ALKPHOS 434* 09/09/2014 1307   ALKPHOS 188* 09/06/2014 0720   AST 156* 09/09/2014 1307   AST 90* 09/06/2014 0720   ALT 154* 09/09/2014 1307   ALT 112* 09/06/2014 0720   BILITOT 8.93 Repeated and Verified* 09/09/2014 1307   BILITOT 7.4* 09/06/2014 0720       RADIOGRAPHIC STUDIES: Ct Abdomen W Contrast  09/09/2014   CLINICAL DATA:  Generalized abdominal pain and obstructive jaundice due to cancer. Metastatic left lung non-small-cell carcinoma. Recently completed chemotherapy.  EXAM: CT ABDOMEN WITH CONTRAST  TECHNIQUE: Multidetector CT imaging of the abdomen was performed using the standard protocol following bolus administration  of intravenous contrast.  CONTRAST:  65m OMNIPAQUE IOHEXOL 300 MG/ML  SOLN  COMPARISON:  Noncontrast CT on 08/06/2014  FINDINGS: Lower chest: New small right pleural effusion and right basilar atelectasis is seen since prior study.  Hepatobiliary: Internal biliary stent is now seen within the common bile duct. Mild intrahepatic biliary dilatation and pneumobilia noted. No definite liver masses are identified.  Pancreas:  No definite mass arising within the pancreas.  Spleen:  Within normal limits in size and appearance.  Adrenal Glands:  No mass identified.  Kidneys: No masses identified. No evidence of hydronephrosis. Small benign-appearing left renal cysts again noted.  Stomach/Bowel/Peritoneum: Visualized portions within the abdomen are unremarkable.  Vascular/Lymphatic: Soft tissue mass is again seen in the porta hepatis and portacaval space which abuts if not encases the internal common bile duct stent. This mass measures 5.5 x 5.4 cm on image 28/series 2 which is mildly increased in size from 5.1 x 5.1  cm on prior study. Other mild areas of retroperitoneal lymphadenopathy within the aorta caval and left paraaortic spaces shows no significant change, with largest lymph node measuring 1.6 cm on image 39/series 2.  3.1 cm infrarenal abdominal aortic aneurysm shows no significant change.  Other:  None.  Musculoskeletal:  No suspicious bone lesions identified.  IMPRESSION: Mild dilatation of intrahepatic bile ducts, with internal common bile duct stent now seen in place.  Mild increase in size of lymphadenopathy in the porta hepatis and portacaval space, consistent with metastatic disease. Other mild abdominal retroperitoneal metastatic lymphadenopathy shows no significant change.  New small right pleural effusion and right basilar atelectasis.  Stable 3.1 cm infrarenal abdominal aortic aneurysm. Recommend followup by ultrasound in 3 years. This recommendation follows ACR consensus guidelines: White Paper of the ACR Incidental Findings Committee II on Vascular Findings. JNatasha MeadColl Radiol 2013; 10:789-794   Electronically Signed   By: JEarle GellM.D.   On: 09/09/2014 16:32   Dg Abd Acute W/chest  08/25/2014   CLINICAL DATA:  History of lung cancer, pancreatic mass with pain at hepatic drain site.  EXAM: DG ABDOMEN ACUTE W/ 1V CHEST  COMPARISON:  Acute abdominal series Aug 23, 2014  FINDINGS: Cardiomediastinal silhouette is unremarkable. Similarly elevated RIGHT hemidiaphragm. Lungs are clear, no pleural effusions. No pneumothorax. Soft tissue planes and included osseous structures are non acute; small calcifications projecting in LEFT humeral head may reflect calcific tendinopathy. Single lumen RIGHT chest Port-A-Cath with distal tip projecting at cavoatrial junction.  Bowel gas pattern is nondilated and nonobstructive. Intact pigtail drainage catheter consistent with biliary drain. Ovoid density in RIGHT upper quadrant is unchanged. No intra-abdominal mass effect, pathologic calcifications or free air. Soft tissue  planes and included osseous structures are nonsuspicious. Mild aortoiliac vascular calcifications.  IMPRESSION: No acute cardiopulmonary process.  Stable appearance of biliary drain.  Normal bowel gas pattern.  No acute cardiopulmonary process.   Electronically Signed   By: CElon AlasM.D.   On: 08/25/2014 04:10   Dg Abd Acute W/chest  08/23/2014   CLINICAL DATA:  Mid abdominal pain. Nausea and vomiting. 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  EXAM: DG ABDOMEN ACUTE W/ 1V CHEST  COMPARISON:  CTs of 08/06/2014  FINDINGS: Frontal view of the chest demonstrates a right Port-A-Cath which terminates at the low SVC. Right hemidiaphragm elevation.  Emphysema at the apices, greater on the right. No pleural effusion or pneumothorax. No lobar consolidation. Midline trachea. Normal heart size. Atherosclerosis in the transverse aorta.  Abdominal films demonstrate  right upper quadrant percutaneous biliary drain. But no free intraperitoneal air. Scattered small bowel air-fluid levels within the right side of the abdomen.  No bowel distension on supine imaging. Distal gas identified. Vascular calcifications.  IMPRESSION: Nonspecific small bowel air-fluid levels within the right-sided abdomen. No bowel distention to strongly suggest obstruction.  Right upper quadrant biliary drain.  No acute cardiopulmonary disease.   Electronically Signed   By: Abigail Miyamoto M.D.   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 There was inability to place a biliary stent via ERCP and request has been made to perform a percutaneous cholangiogram and biliary drainage procedure.  EXAM: 1. ULTRASOUND AND FLUOROSCOPIC GUIDED PERCUTANEOUS TRANSHEPATIC CHOLANGIOGRAM  2. INTERNAL/EXTERNAL PERCUTANEOUS BILIARY DRAINAGE CATHETER PLACEMENT  COMPARISON:  MRI and MRCP study on 08/07/2014  MEDICATIONS: The patient had received a scheduled dose  of IV Unasyn approximately 3 hours prior to the procedure. No additional antibiotics were administered for the procedure itself.  CONTRAST:  39m OMNIPAQUE IOHEXOL 300 MG/ML  SOLN  ANESTHESIA/SEDATION: 4.0 mg IV Versed and 75 mcg IV fentanyl.  Total Moderate Sedation Time  39 minutes.  FLUOROSCOPY TIME:  4 minutes and 42 seconds.  COMPLICATIONS: None.  TECHNIQUE: Informed written consent was obtained from BCommerceafter a discussion of the risks, benefits and alternatives to treatment. Questions regarding the procedure were encouraged and answered. A timeout was performed prior to the initiation of the procedure.  The right upper abdominal quadrant was prepped and draped in the usual sterile fashion, and a sterile drape was applied covering the operative field. Maximum barrier sterile technique with sterile gowns and gloves were used for the procedure. A timeout was performed prior to the initiation of the procedure.  Ultrasound scanning of the right upper abdominal quadrant was performed to delineate the anatomy and avoid transgression of the gallbladder or the pleura. After the overlying soft tissues were anesthetized with 1% Lidocaine, a 22 gauge Chiba needle was advanced under ultrasound guidance into the right lobe of the liver. Access was performed of a right lobe bile duct and contrast injected. A cholangiogram was performed. Multiple images were saved. A guidewire was then advanced into the bile ducts. A transitional dilator was placed. Additional contrast was injected and further central cholangiogram performed of the common bile duct.  The transitional dilator was exchanged for a Kumpe catheter over a Benson wire. The catheter was further advanced over a wire through the common bile duct and into the duodenum. The percutaneous tract was dilated and a 10.2 FPakistanbiliary drainage catheter was advanced with coil ultimately locked within the duodenum.  Contrast was injected and a completion radiograph  was obtained. The catheter was connected to a gravity drainage bag. The catheter was secured to the skin with a Prolene retention suture.  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 A trickle of contrast was noted to enter the distal duct and duodenum. The stricture was able to be traversed with a catheter and guidewire, allowing placement of an internal/external biliary drainage catheter. This was initially connected to gravity bag drainage. Output and laboratory tests will be followed.  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 A 10 French percutaneous  internal/external biliary drainage catheter was able to be placed across the stricture and was formed at the level of the duodenum. The catheter was attached to gravity bag drainage.   Electronically Signed   By: Aletta Edouard M.D.   On: 08/12/2014 12:55   Ir Biliary Stent(s) Existing Access Inc Dilation Cath Exchange  09/05/2014   INDICATION: Common duct obstruction secondary arm metastatic adenopathy, status post internal external biliary drain catheter placement and radiation therapy.  EXAM: CHOLANGIOGRAM THROUGH EXISTING CATHETER  FLUOROSCOPIC GUIDED PERCUTANEOUS INTERNAL BILIARY STENT PLACEMENT  COMPARISON:  08/12/2014  MEDICATIONS: LIDOCAINE 1% SUBCUTANEOUS  CONTRAST:  39m OMNIPAQUE IOHEXOL 300 MG/ML  SOLN  ANESTHESIA/SEDATION: Intravenous Fentanyl and Versed were administered as conscious sedation during continuous cardiorespiratory monitoring by the radiology RN, with a total moderate sedation time of 18 minutes.  FLUOROSCOPY TIME:  3 MINUTES 36 SECONDS, 43 MGY  COMPLICATIONS: None immediate  TECHNIQUE: Informed written consent was obtained from B[place] Springafter a discussion  of the risks, benefits and alternatives to treatment. Questions regarding the procedure were encouraged and answered. A timeout was performed prior to the initiation of the procedure.  Contrast was injected through the previously placed internal external biliary drainage catheter.  Catheter and surrounding skin were then prepped and draped in usual sterile fashion. Maximum barrier sterile technique with sterile gowns and gloves were used for the procedure. A timeout was performed prior to the initiation of the procedure.  The catheter was cut and exchanged over a Bentson wire for a 9 FPakistanvascular sheath for additional cholangiographic images. A 5 FP[place]Kumpe the catheter was advanced to the ligament Treitz. The length of the residual CBD obstruction was measured. An Amplatz wire was placed. Over this, a 10 mm x 60 mm wall flex stent was deployed across the CBD obstruction. Contrast injection was performed. The sheath was removed over a guidewire, and once it was clear that hemostasis had been achieved, the guidewire removed. Site was dressed with a sterile dressing.  The patient tolerated the procedure well without immediate postprocedural complication.  FINDINGS: Injection of the internal external drain demonstrated decompression of the biliary tree and good flow into the duodenum. Additional cholangiographic images after removal of the stent showed Residual long tapered narrowing of the mid and distal CBD, incompletely occlusive. Because of his persistently elevated biliRubin and long residual narrowing of the CBD, it was thought prudent to go ahead open this segment up completely, so the 10 x 60 wall flex stent was deployed. Post deployment imaging shows excellent flow through the length of the CBD without residual narrowing or obstruction. The intrahepatic biliary tree remains decompressed. Cystic duct is patent. Percutaneous access was therefore removed.  IMPRESSION: 1. Persistent narrowing in the mid and  distal CBD. 2. 10 x 60 Wallflex stent placement across distal CBD obstruction, and removal of percutaneous access.   Electronically Signed   By: DLucrezia EuropeM.D.   On: 09/05/2014 16:14   ASSESSMENT AND PLAN: This is a very pleasant 69years old white male with metastatic non-small cell lung cancer, adenocarcinoma underwent several chemotherapy regimens including recent treatment with systemic chemotherapy with Alimta and Avastin status post 6 cycles. The patient tolerated his previous treatment fairly well except for the increasing fatigue and weakness as well as pancytopenia. He has been observation for the last 16 months.  Unfortunately the recent imaging studies showed evidence for significant disease progression involving the chest as well as abdominal masses that was biopsy proven to be poorly  differentiated carcinoma favoring adenocarcinoma. He recently completed a course of palliative radiation to the large abdominal mass. The patient was discussed with and also seen by Dr. [name]. He was to have started his first cycle of immunotherapy with Nivolumab 3 MG/KG every 2 weeks today however with his abnormal LFTs of this will have to be delayed and possibly not instituted if we are unable to get his liver function studies under control. His total bilirubin has increased to 8.93 and this is after the stent placement. His alkaline phosphatase is 434 with an AST of 156 ALT of 154. I personally spoke with Dr. Vernard Gambles who recommended a stat CT of the abdomen with contrast to ensure that the stent was indeed patent and to look for any other possible will causes of the abnormal LFTs. This study was obtained and the stent was patent without any evidence of a glioma. There was a new small right pleural effusion. Possibilities included checking the pleural fluid for bilirubin or perhaps a core liver biopsy to further evaluate the abnormal liver function studies. Dr. Vernard Gambles wondered if the old biliary drain tract was  draining bile into the pleural space. This was again discussed with Dr. [name]. We will refer the patient back to Dr. Benson Norway for further evaluation and possible core liver biopsy or evaluation as he sees fit regarding the persistent abnormal LFTs. Initiation of immunotherapy is on hold for now. He is given a refill for his Dilaudid. He reports the Compazine is really not managing his nausea and vomiting and we will switch him to Zofran 8 mg by mouth every 8 hours as needed. We'll plan to have him return in 2 weeks for reevaluation and discussion of treatment options. For pain management the patient will continue on the current pain medication. He was advised to call immediately if he has any concerning symptoms in the interval. The patient voices understanding of current disease status and treatment options and is in agreement with the current care plan.  All questions were answered. The patient knows to call the clinic with any problems, questions or concerns. We can certainly see the patient much sooner if necessary.  Carlton Adam, PA-C 09/09/2014  ADDENDUM: Hematology/Oncology Attending: I had a face to face encounter with the patient. I recommended his care plan. This is a very pleasant 69 years old white male with metastatic non-small cell lung cancer, adenocarcinoma who was recently found to have evidence for disease progression was large abdominal mass as well as progression in the chest. He underwent stenting by interventional radiology for biliary drainage. The patient is feeling fine today but looks icteric and his serum bilirubin has increased to 8.93. The patient was here today with his wife for discussion of his treatment options. I will plan was to start the patient on immunotherapy with Nivolumab but with the elevated serum bilirubin I'm concerned about toxicity in this patient. We will with custom his case with Dr. Vernard Gambles as well as Dr. Benson Norway to see if there is any intervention to  improve his biliary drainage and liver function before starting the patient on systemic therapy. We will see the patient back for follow-up visit in 2 weeks for reevaluation and repeat blood work. He was advised to call immediately if he has any other concerning symptoms in the interval.  Disclaimer: This note was dictated with voice recognition software. Similar sounding words can inadvertently be transcribed and may not be corrected upon review. Eilleen Kempf., MD 09/11/2014

## 2014-09-10 ENCOUNTER — Telehealth: Payer: Self-pay | Admitting: Internal Medicine

## 2014-09-10 ENCOUNTER — Telehealth: Payer: Self-pay

## 2014-09-10 ENCOUNTER — Telehealth: Payer: Self-pay | Admitting: *Deleted

## 2014-09-10 ENCOUNTER — Other Ambulatory Visit: Payer: Self-pay | Admitting: *Deleted

## 2014-09-10 DIAGNOSIS — C3490 Malignant neoplasm of unspecified part of unspecified bronchus or lung: Secondary | ICD-10-CM

## 2014-09-10 MED ORDER — ONDANSETRON HCL 8 MG PO TABS: 8.0000 mg | ORAL_TABLET | Freq: Three times a day (TID) | ORAL | Status: DC | PRN

## 2014-09-10 MED ORDER — HYDROMORPHONE HCL 4 MG PO TABS: 4.0000 mg | ORAL_TABLET | Freq: Four times a day (QID) | ORAL | Status: DC | PRN

## 2014-09-10 NOTE — Telephone Encounter (Signed)
Called and let pt know dilaudid rx is ready for pickup. In the meantime shiela had called Dr Ulyses Amor office and they have not received the referral. Will inbasket schedulers.

## 2014-09-10 NOTE — Telephone Encounter (Signed)
Voicemail retrieved by Ubaldo Glassing RN to call patient's wife about need for GI referral.  Called patient informing him referral was made yesterday and this nurse has sent P.O.F. To schedulers.  "But they don't know what they're seeing me for."  Informed him of reason on the referral.  Scheduler can tell Dr. Benson Norway and this nurse will send a note to the office as well.  Referral reason reads: "Refer back to Dr. Carol Ada increase elevation of LFTs despite recent stent ( patent per CT of abdomen 09/09/2014) Please evaluate and manage"

## 2014-09-10 NOTE — Telephone Encounter (Signed)
S/w [name] that GI consult will be with Dr Carol Ada. [name] also mentioned Zofran being discussed as a change from compazine. S/w Adrena and received order for zofran. Also Dilaudid refill paperwork prepared for signature.

## 2014-09-10 NOTE — Telephone Encounter (Signed)
s.w. pt and advised on June appt he will pick up new sched at next visit

## 2014-09-10 NOTE — Telephone Encounter (Signed)
Wife called asking about GI consult, who will be contacting them?

## 2014-09-11 ENCOUNTER — Telehealth: Payer: Self-pay | Admitting: Internal Medicine

## 2014-09-11 ENCOUNTER — Other Ambulatory Visit: Payer: Self-pay | Admitting: Medical Oncology

## 2014-09-11 ENCOUNTER — Telehealth: Payer: Self-pay | Admitting: Medical Oncology

## 2014-09-11 NOTE — Telephone Encounter (Signed)
Faxed records to Dr. Ulyses Amor office

## 2014-09-11 NOTE — Patient Instructions (Signed)
We have Center for a stat CT to evaluate your stent. He was found to be patent. We're sending you back to Dr. Carol Ada to further evaluate your liver as your liver enzymes remain elevated of unclear etiology The initiation of immunotherapy has been put on hold Follow-up in 2 weeks

## 2014-09-11 NOTE — Telephone Encounter (Signed)
I called Dr. Minerva Areola office the pts wife had already called the office and they s.w. Desk nurse that was supposed to have Dr MM contact Dr. Benson Norway to give information and get appt.

## 2014-09-11 NOTE — Telephone Encounter (Signed)
Shelia called as instructed by Dr. Ulyses Amor office this morning to call.  "They do not know what to do, have not received scans and expect a doctor to doctor call.  We need to get this done before his bilirubin goes up."  Dr. Ulyses Amor office number is [phone].  Will notify Dr. [name] and Awilda Metro PA-C.

## 2014-09-11 NOTE — Telephone Encounter (Signed)
Dr [name] notified to call Dr Benson Norway

## 2014-09-11 NOTE — Telephone Encounter (Signed)
Per Dr Minerva Areola nurse , "please have [facility] call Dr Benson Norway.". Note to Dr [name].

## 2014-09-13 ENCOUNTER — Telehealth: Payer: Self-pay | Admitting: *Deleted

## 2014-09-13 NOTE — Telephone Encounter (Signed)
Pt called with concerns regarding treatment. Pt states" everything is at a dead stop, I got a call from Dr. Benson Norway and he said there is nothing he can do. What do I do now?" Discussed with pt I will review his concern with MD and call back with additional information Reviewed above concern with MD, returned call to pt per MD pt to return in 2 weeks for repeat bloodwork and evaluation. Pt verbalized understanding,states" At least now I have a direction" No further concerns.

## 2014-09-17 ENCOUNTER — Telehealth: Payer: Self-pay | Admitting: *Deleted

## 2014-09-17 MED ORDER — CIPROFLOXACIN HCL 500 MG PO TABS: 500.0000 mg | ORAL_TABLET | Freq: Two times a day (BID) | ORAL | Status: DC

## 2014-09-17 NOTE — Telephone Encounter (Signed)
-----   Message from [name], MD sent at 09/16/2014  6:11 PM EDT ----- Regarding: RE: IVF Contact: [phone] Yes. ----- Message -----    From: Lucile Crater, RN    Sent: 09/16/2014   4:45 PM      To: [name], MD Subject: IVF                                            Dr. Debbora Lacrosse- [facility] RN called advised current orders for: IVF NS 1L 3x week x 3 weeks.  This will be pts third week, would MD like to continue IVF for any additional weeks.  Pt has MD appt 6/27  Please advise thanks, [facility]

## 2014-09-17 NOTE — Telephone Encounter (Signed)
Start cipro 500 mg po bid for 7 days.. Go to The ED if Temp over 101

## 2014-09-17 NOTE — Telephone Encounter (Signed)
Call to [place] at [place], VO to continue IVF 3x week. Pt to see MD on 6/27, [facility] will be notified with any changes. Hoyle Sauer verbalized understanding.

## 2014-09-17 NOTE — Telephone Encounter (Signed)
Called pt Per MD instructed pt to begin siprom '500mg'$  BID x 7 Days, proceed to ED if Temp over 101

## 2014-09-17 NOTE — Telephone Encounter (Signed)
Reviewed pt allergies with pt, pt reports he had knee pain with Levaquin, however it was not that bad.  Discussed with pt to call office with any concerns or side effects, reactions to Cipro. Pt verabalized understanding no further concerns.

## 2014-09-17 NOTE — Addendum Note (Signed)
Addended by: Lucile Crater on: 09/17/2014 03:15 PM   Modules accepted: Orders

## 2014-09-17 NOTE — Telephone Encounter (Addendum)
PT. HAD AN ABDOMINAL STENT PLACED ON 09/05/14 AT [facility]. THE FOLLOWING WEEKEND PT. BEGAN RUNNING AN INTERMITTENT FEVER. PT.'S WIFE HAS BEEN APPLYING THE ANTIBIOTIC CREAM WHICH WAS GIVEN TO THEM BY IR.  THIS WEEKEND THERE WAS "PUS" AROUND THE DRAINAGE TUBE. PT. IS HAVING OCCASIONAL SHARP PAIN BUT IT IS BETTER SINCE THE STENT WAS PLACED. HE ALSO BECOMES NAUSEATED IN THE EVENING WHEN HIS TEMPERATURE IS ELEVATED. [facility] N[facility], CAROLYN, [place] AND IS TO CALL DR.MOHAMED'S OFFICE TODAY. SPOKE TO KEVIN AT [facility]. HE ASKED THAT THE [facility] NURSE GIVE HER ASSESSMENT. CALLED [facility] AND SPOKE WITH TINA SHE WILL HAVE CAROLYN CALL KEVIN CONCERNING PT.'S CONDITION. 2) PT.'S ORDERS FOR IV FLUIDS ENDS Friday. PT.'S FLUID INTAKE IS USUALLY 24 OUNCES IN A 24 HOUR PERIOD. HIS APPETITE IS POOR. BLOOD PRESSURE RUNS LOW AT 100/60 TO 90/60. WOULD LIKE TO EXTEND IV FLUIDS ORDER.

## 2014-09-18 DIAGNOSIS — C772 Secondary and unspecified malignant neoplasm of intra-abdominal lymph nodes: Secondary | ICD-10-CM | POA: Insufficient documentation

## 2014-09-20 ENCOUNTER — Encounter (HOSPITAL_COMMUNITY): Payer: Self-pay | Admitting: Emergency Medicine

## 2014-09-20 ENCOUNTER — Emergency Department (HOSPITAL_COMMUNITY): Payer: Medicare Other

## 2014-09-20 ENCOUNTER — Inpatient Hospital Stay (HOSPITAL_COMMUNITY)
Admission: EM | Admit: 2014-09-20 | Discharge: 2014-09-24 | DRG: 919 | Disposition: A | Discharge status: Home / Self Care | Payer: Medicare Other | Attending: Family Medicine | Admitting: Family Medicine

## 2014-09-20 ENCOUNTER — Other Ambulatory Visit: Payer: Self-pay | Admitting: *Deleted

## 2014-09-20 DIAGNOSIS — I10 Essential (primary) hypertension: Secondary | ICD-10-CM | POA: Diagnosis present

## 2014-09-20 DIAGNOSIS — Z8249 Family history of ischemic heart disease and other diseases of the circulatory system: Secondary | ICD-10-CM

## 2014-09-20 DIAGNOSIS — Z880 Allergy status to penicillin: Secondary | ICD-10-CM

## 2014-09-20 DIAGNOSIS — D509 Iron deficiency anemia, unspecified: Secondary | ICD-10-CM | POA: Diagnosis present

## 2014-09-20 DIAGNOSIS — Z885 Allergy status to narcotic agent status: Secondary | ICD-10-CM

## 2014-09-20 DIAGNOSIS — Z79891 Long term (current) use of opiate analgesic: Secondary | ICD-10-CM

## 2014-09-20 DIAGNOSIS — C3492 Malignant neoplasm of unspecified part of left bronchus or lung: Secondary | ICD-10-CM

## 2014-09-20 DIAGNOSIS — R251 Tremor, unspecified: Secondary | ICD-10-CM | POA: Diagnosis present

## 2014-09-20 DIAGNOSIS — H9190 Unspecified hearing loss, unspecified ear: Secondary | ICD-10-CM | POA: Diagnosis present

## 2014-09-20 DIAGNOSIS — E440 Moderate protein-calorie malnutrition: Secondary | ICD-10-CM | POA: Diagnosis present

## 2014-09-20 DIAGNOSIS — R64 Cachexia: Secondary | ICD-10-CM | POA: Diagnosis present

## 2014-09-20 DIAGNOSIS — A419 Sepsis, unspecified organism: Secondary | ICD-10-CM | POA: Diagnosis present

## 2014-09-20 DIAGNOSIS — R109 Unspecified abdominal pain: Secondary | ICD-10-CM

## 2014-09-20 DIAGNOSIS — Z881 Allergy status to other antibiotic agents status: Secondary | ICD-10-CM

## 2014-09-20 DIAGNOSIS — M1990 Unspecified osteoarthritis, unspecified site: Secondary | ICD-10-CM | POA: Diagnosis present

## 2014-09-20 DIAGNOSIS — E43 Unspecified severe protein-calorie malnutrition: Secondary | ICD-10-CM | POA: Diagnosis present

## 2014-09-20 DIAGNOSIS — C7989 Secondary malignant neoplasm of other specified sites: Secondary | ICD-10-CM | POA: Diagnosis present

## 2014-09-20 DIAGNOSIS — C3490 Malignant neoplasm of unspecified part of unspecified bronchus or lung: Secondary | ICD-10-CM | POA: Diagnosis present

## 2014-09-20 DIAGNOSIS — R509 Fever, unspecified: Secondary | ICD-10-CM | POA: Diagnosis not present

## 2014-09-20 DIAGNOSIS — Z800 Family history of malignant neoplasm of digestive organs: Secondary | ICD-10-CM

## 2014-09-20 DIAGNOSIS — J690 Pneumonitis due to inhalation of food and vomit: Secondary | ICD-10-CM | POA: Diagnosis present

## 2014-09-20 DIAGNOSIS — Y838 Other surgical procedures as the cause of abnormal reaction of the patient, or of later complication, without mention of misadventure at the time of the procedure: Secondary | ICD-10-CM | POA: Diagnosis present

## 2014-09-20 DIAGNOSIS — R1900 Intra-abdominal and pelvic swelling, mass and lump, unspecified site: Secondary | ICD-10-CM | POA: Diagnosis present

## 2014-09-20 DIAGNOSIS — Z681 Body mass index (BMI) 19 or less, adult: Secondary | ICD-10-CM

## 2014-09-20 DIAGNOSIS — K831 Obstruction of bile duct: Secondary | ICD-10-CM | POA: Diagnosis present

## 2014-09-20 DIAGNOSIS — Z888 Allergy status to other drugs, medicaments and biological substances status: Secondary | ICD-10-CM

## 2014-09-20 DIAGNOSIS — T8579XA Infection and inflammatory reaction due to other internal prosthetic devices, implants and grafts, initial encounter: Secondary | ICD-10-CM | POA: Diagnosis not present

## 2014-09-20 DIAGNOSIS — R6510 Systemic inflammatory response syndrome (SIRS) of non-infectious origin without acute organ dysfunction: Secondary | ICD-10-CM | POA: Diagnosis present

## 2014-09-20 DIAGNOSIS — N400 Enlarged prostate without lower urinary tract symptoms: Secondary | ICD-10-CM | POA: Diagnosis present

## 2014-09-20 DIAGNOSIS — J189 Pneumonia, unspecified organism: Secondary | ICD-10-CM | POA: Diagnosis present

## 2014-09-20 DIAGNOSIS — Z87891 Personal history of nicotine dependence: Secondary | ICD-10-CM

## 2014-09-20 DIAGNOSIS — Z79899 Other long term (current) drug therapy: Secondary | ICD-10-CM

## 2014-09-20 LAB — CBC WITH DIFFERENTIAL/PLATELET
Basophils Absolute: 0 10*3/uL (ref 0.0–0.1)
Basophils Relative: 0 % (ref 0–1)
EOS ABS: 0.1 10*3/uL (ref 0.0–0.7)
Eosinophils Relative: 1 % (ref 0–5)
HEMATOCRIT: 31.3 % — AB (ref 39.0–52.0)
Hemoglobin: 10.1 g/dL — ABNORMAL LOW (ref 13.0–17.0)
Lymphocytes Relative: 12 % (ref 12–46)
Lymphs Abs: 0.9 10*3/uL (ref 0.7–4.0)
MCH: 31.9 pg (ref 26.0–34.0)
MCHC: 32.3 g/dL (ref 30.0–36.0)
MCV: 98.7 fL (ref 78.0–100.0)
MONO ABS: 0.6 10*3/uL (ref 0.1–1.0)
MONOS PCT: 8 % (ref 3–12)
NEUTROS ABS: 6.3 10*3/uL (ref 1.7–7.7)
NEUTROS PCT: 79 % — AB (ref 43–77)
Platelets: 224 10*3/uL (ref 150–400)
RBC: 3.17 MIL/uL — AB (ref 4.22–5.81)
RDW: 17.1 % — ABNORMAL HIGH (ref 11.5–15.5)
WBC: 8 10*3/uL (ref 4.0–10.5)

## 2014-09-20 LAB — COMPREHENSIVE METABOLIC PANEL
ALBUMIN: 2.7 g/dL — AB (ref 3.5–5.0)
ALT: 88 U/L — ABNORMAL HIGH (ref 17–63)
AST: 91 U/L — ABNORMAL HIGH (ref 15–41)
Alkaline Phosphatase: 636 U/L — ABNORMAL HIGH (ref 38–126)
Anion gap: 7 (ref 5–15)
BILIRUBIN TOTAL: 4.8 mg/dL — AB (ref 0.3–1.2)
BUN: 12 mg/dL (ref 6–20)
CALCIUM: 8.8 mg/dL — AB (ref 8.9–10.3)
CO2: 25 mmol/L (ref 22–32)
Chloride: 107 mmol/L (ref 101–111)
Creatinine, Ser: 1.28 mg/dL — ABNORMAL HIGH (ref 0.61–1.24)
GFR calc Af Amer: >60 mL/min (ref >60)
GFR calc non Af Amer: 55 mL/min — ABNORMAL LOW (ref >60)
Glucose, Bld: 150 mg/dL — ABNORMAL HIGH (ref 65–99)
Potassium: 4.9 mmol/L (ref 3.5–5.1)
Sodium: 139 mmol/L (ref 135–145)
TOTAL PROTEIN: 6.7 g/dL (ref 6.5–8.1)

## 2014-09-20 LAB — LACTIC ACID, PLASMA: LACTIC ACID, VENOUS: 1.6 mmol/L (ref 0.5–2.0)

## 2014-09-20 MED ORDER — VANCOMYCIN HCL IN DEXTROSE 1-5 GM/200ML-% IV SOLN
1000.0000 mg | Freq: Once | INTRAVENOUS | Status: AC
  Administered 2014-09-21: 1000 mg via INTRAVENOUS
  Filled 2014-09-20: qty 200

## 2014-09-20 MED ORDER — IOHEXOL 300 MG/ML  SOLN
50.0000 mL | Freq: Once | INTRAMUSCULAR | Status: AC | PRN
  Administered 2014-09-20: 50 mL via ORAL

## 2014-09-20 MED ORDER — PIPERACILLIN-TAZOBACTAM 3.375 G IVPB 30 MIN
3.3750 g | Freq: Once | INTRAVENOUS | Status: AC
  Administered 2014-09-20: 3.375 g via INTRAVENOUS
  Filled 2014-09-20: qty 50

## 2014-09-20 MED ORDER — HYDROMORPHONE HCL 1 MG/ML IJ SOLN
1.0000 mg | Freq: Once | INTRAMUSCULAR | Status: AC
  Administered 2014-09-20: 1 mg via INTRAVENOUS
  Filled 2014-09-20: qty 1

## 2014-09-20 NOTE — ED Notes (Signed)
Nurse starting IV 

## 2014-09-20 NOTE — ED Notes (Signed)
Patient has lung cancer and currently has O2 Sat in the low 90s on assessment

## 2014-09-20 NOTE — ED Notes (Signed)
Delay in lab draw, pt now in [place]

## 2014-09-20 NOTE — ED Notes (Signed)
Pt from home reports he was taking Cipro since Tuesday for a possible infection. The pt had a drain that is in his left side that developed an infection. He had a stint put in the place of the drain and was given antibiotic cream to put on it. Pt has right sided abdominal pain and had a fever of 101.7 at home. He is scheduled to restart chemotherapy this Monday.

## 2014-09-20 NOTE — ED Notes (Signed)
Port flushed, but could not get blood return. Pt stated that this is a common problem with his port. Unable to draw labs at this time.

## 2014-09-20 NOTE — ED Provider Notes (Signed)
CSN: [id number]     Arrival date & time 09/20/14  2103 History   First MD Initiated Contact with Patient 09/20/14 2154     Chief Complaint  Patient presents with  . Fever  . Abdominal Pain     (Consider location/radiation/quality/duration/timing/severity/associated sxs/prior Treatment) HPI Comments: Patient is stage IV metastatic lung cancer and is currently on palliative radiotherapy and immunotherapy  Patient is a 69 y.o. male presenting with fever. The history is provided by the patient.  Fever Max temp prior to arrival:  101.7 Temp source:  Oral Severity:  Moderate Onset quality:  Gradual Duration:  1 day Timing:  Constant Progression:  Unchanged Chronicity:  New Relieved by:  Nothing Worsened by:  Nothing tried Associated symptoms: chills   Associated symptoms: no chest pain and no vomiting   Risk factors: hx of cancer, immunosuppression and recent surgery (recent biliary stent due to obstructive jaundice)     Past Medical History  Diagnosis Date  . Hypertension   . Arthritis RIGHT SHOULDER  . Immature cataract BILATERAL  . Non-small cell lung cancer DX SEPT 2010  W/ CHEMORADIATION AT THAT TIME -- NOW  W/ METS--  CURRENTLY ON MAINTENANCE CHEMO TX  EVERY 30 DAYS    ONCOLOGIST- DR [facility]  . Acute meniscal tear of knee RIGHT KNEE  . BPH (benign prostatic hypertrophy)   . Dizziness and giddiness 04/23/2014  . Tremor 04/23/2014    Right hand  . HOH (hard of hearing)     bilateral hearing aids  . Radiation 01/08/09-01/21/09    Mediastinum 30 Gy x 12 fractions   Past Surgical History  Procedure Laterality Date  . 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 Rotator cuff repair  2008    LEFT SHOULDER  . Bilateral ear drum surgery  1960'S  . Orif left ring finger  and revascularization of radial side  02-16-2007    DEGLOVING INJURY  . Knee arthroscopy  12/21/2011    Procedure: ARTHROSCOPY KNEE;   Surgeon: Tobi Bastos, MD;  Location: [facility];  Service: Orthopedics;  Laterality: Right;  WITH MEDIAL MENISECTOMY  . Cataract extraction Bilateral   . Tonsillectomy      as child  . Ovarian cyst surgery      removed from left jaw area- benign  . Ovarian cyst surgery       NOTE; pt unaware of why this is in his chart  . Shoulder open rotator cuff repair Right 06/05/2014    Procedure: RIGHT ROTATOR CUFF REPAIR SHOULDER OPEN;  Surgeon: Latanya Maudlin, MD;  Location: [facility];  Service: Orthopedics;  Laterality: Right;  . Ercp N/A 08/09/2014    Procedure: ENDOSCOPIC RETROGRADE CHOLANGIOPANCREATOGRAPHY (ERCP);  Surgeon: Carol Ada, MD;  Location: [facility];  Service: Endoscopy;  Laterality: N/A;  . Eus N/A 08/09/2014    Procedure: UPPER ENDOSCOPIC ULTRASOUND (EUS) RADIAL;  Surgeon: Carol Ada, MD;  Location: [facility];  Service: Endoscopy;  Laterality: N/A;  . Fine needle aspiration N/A 08/09/2014    Procedure: FINE NEEDLE ASPIRATION (FNA) LINEAR;  Surgeon: Carol Ada, MD;  Location: [facility];  Service: Endoscopy;  Laterality: N/A;  . Esophagogastroduodenoscopy (egd) with propofol N/A 08/09/2014    Procedure: ESOPHAGOGASTRODUODENOSCOPY (EGD) WITH PROPOFOL;  Surgeon: Carol Ada, MD;  Location: [facility];  Service: Endoscopy;  Laterality: N/A;   Family History  Problem Relation Age of Onset  . Colon cancer    .  Heart attack    . Cancer    . Hypertension    . Hyperlipidemia    . Congestive Heart Failure Mother   . Cancer Sister   . Cancer Maternal Grandfather   . Colon cancer Father   . Heart attack Brother    History  Substance Use Topics  . Smoking status: Former Smoker -- 2.00 packs/day for 20 years    Types: Cigarettes    Quit date: 03/17/1981  . Smokeless tobacco: Never Used  . Alcohol Use: 3.5 oz/week    7 Standard drinks or equivalent per week     Comment: beer daily    Review of Systems  Constitutional: Positive for fever and chills.   Cardiovascular: Negative for chest pain.  Gastrointestinal: Negative for vomiting and abdominal pain.  All other systems reviewed and are negative.     Allergies  Augmentin; Carboplatin; Levaquin; Percocet; and Zolpidem tartrate  Home Medications   Prior to Admission medications   Medication Sig Start Date End Date Taking? Authorizing Provider  Ascorbic Acid (VITAMIN C) 1000 MG tablet Take 1,000 mg by mouth daily.   Yes Historical Provider, MD  atenolol (TENORMIN) 100 MG tablet Take 100 mg by mouth every morning.    Yes Historical Provider, MD  ciprofloxacin (CIPRO) 500 MG tablet Take 1 tablet (500 mg total) by mouth 2 (two) times daily. 09/17/14  Yes [name], MD  FeFum-FePoly-FA-B Cmp-C-Biot [facility]) CAPS Take 1 capsule by mouth daily. 11/13/13  Yes [name], MD  HYDROmorphone (DILAUDID) 4 MG tablet Take 1 tablet (4 mg total) by mouth every 6 (six) hours as needed for moderate pain or severe pain. 09/10/14  Yes Adrena E Johnson, PA-C  morphine (MS CONTIN) 15 MG 12 hr tablet Take 1 tablet (15 mg total) by mouth every 12 (twelve) hours. 09/02/14  Yes Carlton Adam, PA-C  Multiple Vitamin (MULTIVITAMIN) tablet Take 1 tablet by mouth daily.    Yes Historical Provider, MD  MYRBETRIQ 50 MG TB24 tablet Take 50 mg by mouth every evening.  01/09/14  Yes Historical Provider, MD  ondansetron (ZOFRAN) 8 MG tablet Take 1 tablet (8 mg total) by mouth every 8 (eight) hours as needed for nausea or vomiting. 09/10/14  Yes Adrena E Johnson, PA-C  Sennosides-Docusate Sodium (SENOKOT S PO) Take 2 tablets by mouth daily as needed (constipation).    Yes Historical Provider, MD  omeprazole (PRILOSEC) 40 MG capsule Take 1 capsule (40 mg total) by mouth every evening. Patient taking differently: Take 40 mg by mouth every morning.  11/13/13   [name], MD  prochlorperazine (COMPAZINE) 10 MG tablet Take 1 tablet (10 mg total) by mouth every 6 (six) hours as needed. Patient taking  differently: Take 10 mg by mouth every 6 (six) hours as needed for nausea or vomiting.  08/27/14   [name], MD   BP 118/72 mmHg  Pulse 77  Temp(Src) 98.2 F (36.8 C) (Oral)  Resp 18  Ht '5\' 8"'$  (1.727 m)  Wt 115 lb (52.164 kg)  BMI 17.49 kg/m2  SpO2 95% Physical Exam  Constitutional: He is oriented to person, place, and time. He appears well-developed and well-nourished. No distress.  HENT:  Head: Normocephalic and atraumatic.  Mouth/Throat: No oropharyngeal exudate.  Eyes: EOM are normal. Pupils are equal, round, and reactive to light.  Neck: Normal range of motion. Neck supple.  Cardiovascular: Normal rate and regular rhythm.  Exam reveals no friction rub.   No murmur heard. Pulmonary/Chest: Effort normal and  breath sounds normal. No respiratory distress. He has no wheezes. He has no rales.  Abdominal: He exhibits no distension. There is tenderness (RUQ). There is no rebound.  Musculoskeletal: Normal range of motion. He exhibits no edema.  Neurological: He is alert and oriented to person, place, and time.  Skin: He is not diaphoretic.  Nursing note and vitals reviewed.   ED Course  Procedures (including critical care time) Labs Review Labs Reviewed  CBC WITH DIFFERENTIAL/PLATELET - Abnormal; Notable for the following:    RBC 3.17 (*)    Hemoglobin 10.1 (*)    HCT 31.3 (*)    RDW 17.1 (*)    Neutrophils Relative % 79 (*)    All other components within normal limits  COMPREHENSIVE METABOLIC PANEL - Abnormal; Notable for the following:    Glucose, Bld 150 (*)    Creatinine, Ser 1.28 (*)    Calcium 8.8 (*)    Albumin 2.7 (*)    AST 91 (*)    ALT 88 (*)    Alkaline Phosphatase 636 (*)    Total Bilirubin 4.8 (*)    GFR calc non Af Amer 55 (*)    All other components within normal limits  CULTURE, BLOOD (ROUTINE X 2)  CULTURE, BLOOD (ROUTINE X 2)  URINE CULTURE  LACTIC ACID, PLASMA  URINALYSIS, ROUTINE W REFLEX MICROSCOPIC (NOT AT [facility])  LIPASE, BLOOD     Imaging Review Dg Chest 2 View  09/20/2014   CLINICAL DATA:  Left-sided drain infection. Right-sided abdominal pain and fever. Initial encounter.  EXAM: CHEST  2 VIEW  COMPARISON:  Chest radiograph performed 08/25/2014  FINDINGS: The lungs are well-aerated. A new small right pleural effusion is noted, with associated airspace opacity. The left lung appears clear. No pneumothorax is identified.  The heart remains normal in size. A right-sided chest port is noted ending about the distal SVC. No acute osseous abnormalities are identified.  IMPRESSION: New small right pleural effusion, with associated airspace opacity.   Electronically Signed   By: Garald Balding M.D.   On: 09/20/2014 22:13     EKG Interpretation None      MDM   Final diagnoses:  Abdominal pain    69 year old male here with fever and right-sided abdominal pain. History of metastatic lung cancer. He recently had obstructive jaundice that required a stent placement by interventional radiology. He's currently going palliative radiotherapy for abdominal mass and immunotherapy with Nivolumab.  His recent stent placed for obstructive jaundice was preceded by right upper quadrant pain and jaundice. He started having a fever today at 101.7 at home. His color is worsening per the family. Here he is well appearing, afebrile. He has some right upper quadrant pain here. Vitals are stable. He is not febrile here. He has no white count. Chest x-ray shows a right lower lobe pneumonia which could be because upper quadrant pain. Will start on broad-spectrum manner biotics. Will plan for admission. CMP shows decreased bilirubin of 4.8. Will proceed with CT scan to look for other possible issues.    Evelina Bucy, MD 09/21/14 [phone]

## 2014-09-21 DIAGNOSIS — J189 Pneumonia, unspecified organism: Secondary | ICD-10-CM

## 2014-09-21 DIAGNOSIS — I10 Essential (primary) hypertension: Secondary | ICD-10-CM | POA: Diagnosis present

## 2014-09-21 DIAGNOSIS — Z87891 Personal history of nicotine dependence: Secondary | ICD-10-CM | POA: Diagnosis not present

## 2014-09-21 DIAGNOSIS — C3490 Malignant neoplasm of unspecified part of unspecified bronchus or lung: Secondary | ICD-10-CM | POA: Diagnosis present

## 2014-09-21 DIAGNOSIS — H9190 Unspecified hearing loss, unspecified ear: Secondary | ICD-10-CM | POA: Diagnosis present

## 2014-09-21 DIAGNOSIS — A419 Sepsis, unspecified organism: Secondary | ICD-10-CM

## 2014-09-21 DIAGNOSIS — R509 Fever, unspecified: Secondary | ICD-10-CM | POA: Insufficient documentation

## 2014-09-21 DIAGNOSIS — Z888 Allergy status to other drugs, medicaments and biological substances status: Secondary | ICD-10-CM | POA: Diagnosis not present

## 2014-09-21 DIAGNOSIS — R251 Tremor, unspecified: Secondary | ICD-10-CM | POA: Diagnosis present

## 2014-09-21 DIAGNOSIS — K5669 Other intestinal obstruction: Secondary | ICD-10-CM | POA: Diagnosis not present

## 2014-09-21 DIAGNOSIS — Z880 Allergy status to penicillin: Secondary | ICD-10-CM | POA: Diagnosis not present

## 2014-09-21 DIAGNOSIS — R64 Cachexia: Secondary | ICD-10-CM | POA: Diagnosis present

## 2014-09-21 DIAGNOSIS — Z681 Body mass index (BMI) 19 or less, adult: Secondary | ICD-10-CM | POA: Diagnosis not present

## 2014-09-21 DIAGNOSIS — C3492 Malignant neoplasm of unspecified part of left bronchus or lung: Secondary | ICD-10-CM

## 2014-09-21 DIAGNOSIS — C7989 Secondary malignant neoplasm of other specified sites: Secondary | ICD-10-CM | POA: Diagnosis present

## 2014-09-21 DIAGNOSIS — Z8249 Family history of ischemic heart disease and other diseases of the circulatory system: Secondary | ICD-10-CM | POA: Diagnosis not present

## 2014-09-21 DIAGNOSIS — R6510 Systemic inflammatory response syndrome (SIRS) of non-infectious origin without acute organ dysfunction: Secondary | ICD-10-CM | POA: Diagnosis present

## 2014-09-21 DIAGNOSIS — R1011 Right upper quadrant pain: Secondary | ICD-10-CM | POA: Diagnosis not present

## 2014-09-21 DIAGNOSIS — J690 Pneumonitis due to inhalation of food and vomit: Secondary | ICD-10-CM | POA: Diagnosis present

## 2014-09-21 DIAGNOSIS — Z79891 Long term (current) use of opiate analgesic: Secondary | ICD-10-CM | POA: Diagnosis not present

## 2014-09-21 DIAGNOSIS — R1900 Intra-abdominal and pelvic swelling, mass and lump, unspecified site: Secondary | ICD-10-CM | POA: Diagnosis present

## 2014-09-21 DIAGNOSIS — T8579XA Infection and inflammatory reaction due to other internal prosthetic devices, implants and grafts, initial encounter: Secondary | ICD-10-CM | POA: Diagnosis present

## 2014-09-21 DIAGNOSIS — K831 Obstruction of bile duct: Secondary | ICD-10-CM | POA: Diagnosis present

## 2014-09-21 DIAGNOSIS — Z800 Family history of malignant neoplasm of digestive organs: Secondary | ICD-10-CM | POA: Diagnosis not present

## 2014-09-21 DIAGNOSIS — Z881 Allergy status to other antibiotic agents status: Secondary | ICD-10-CM | POA: Diagnosis not present

## 2014-09-21 DIAGNOSIS — R109 Unspecified abdominal pain: Secondary | ICD-10-CM | POA: Insufficient documentation

## 2014-09-21 DIAGNOSIS — M1990 Unspecified osteoarthritis, unspecified site: Secondary | ICD-10-CM | POA: Diagnosis present

## 2014-09-21 DIAGNOSIS — R17 Unspecified jaundice: Secondary | ICD-10-CM | POA: Diagnosis not present

## 2014-09-21 DIAGNOSIS — E43 Unspecified severe protein-calorie malnutrition: Secondary | ICD-10-CM | POA: Diagnosis present

## 2014-09-21 DIAGNOSIS — Y838 Other surgical procedures as the cause of abnormal reaction of the patient, or of later complication, without mention of misadventure at the time of the procedure: Secondary | ICD-10-CM | POA: Diagnosis present

## 2014-09-21 DIAGNOSIS — D509 Iron deficiency anemia, unspecified: Secondary | ICD-10-CM | POA: Diagnosis present

## 2014-09-21 DIAGNOSIS — Z79899 Other long term (current) drug therapy: Secondary | ICD-10-CM | POA: Diagnosis not present

## 2014-09-21 DIAGNOSIS — K838 Other specified diseases of biliary tract: Secondary | ICD-10-CM

## 2014-09-21 DIAGNOSIS — N400 Enlarged prostate without lower urinary tract symptoms: Secondary | ICD-10-CM | POA: Diagnosis present

## 2014-09-21 DIAGNOSIS — K829 Disease of gallbladder, unspecified: Secondary | ICD-10-CM | POA: Diagnosis not present

## 2014-09-21 DIAGNOSIS — Z885 Allergy status to narcotic agent status: Secondary | ICD-10-CM | POA: Diagnosis not present

## 2014-09-21 LAB — CBC
HCT: 23.3 % — ABNORMAL LOW (ref 39.0–52.0)
Hemoglobin: 7.5 g/dL — ABNORMAL LOW (ref 13.0–17.0)
MCH: 31.6 pg (ref 26.0–34.0)
MCHC: 32.2 g/dL (ref 30.0–36.0)
MCV: 98.3 fL (ref 78.0–100.0)
Platelets: 183 10*3/uL (ref 150–400)
RBC: 2.37 MIL/uL — ABNORMAL LOW (ref 4.22–5.81)
RDW: 17 % — ABNORMAL HIGH (ref 11.5–15.5)
WBC: 6.5 10*3/uL (ref 4.0–10.5)

## 2014-09-21 LAB — BASIC METABOLIC PANEL
ANION GAP: 6 (ref 5–15)
BUN: 12 mg/dL (ref 6–20)
CO2: 26 mmol/L (ref 22–32)
Calcium: 8.2 mg/dL — ABNORMAL LOW (ref 8.9–10.3)
Chloride: 103 mmol/L (ref 101–111)
Creatinine, Ser: 1.18 mg/dL (ref 0.61–1.24)
GFR calc Af Amer: >60 mL/min (ref >60)
GFR calc non Af Amer: >60 mL/min (ref >60)
Glucose, Bld: 110 mg/dL — ABNORMAL HIGH (ref 65–99)
Potassium: 3.8 mmol/L (ref 3.5–5.1)
Sodium: 135 mmol/L (ref 135–145)

## 2014-09-21 LAB — URINALYSIS, ROUTINE W REFLEX MICROSCOPIC
Glucose, UA: NEGATIVE mg/dL
Hgb urine dipstick: NEGATIVE
Ketones, ur: NEGATIVE mg/dL
Leukocytes, UA: NEGATIVE
NITRITE: NEGATIVE
Protein, ur: NEGATIVE mg/dL
SPECIFIC GRAVITY, URINE: 1.016 (ref 1.005–1.030)
Urobilinogen, UA: 0.2 mg/dL (ref 0.0–1.0)
pH: 6 (ref 5.0–8.0)

## 2014-09-21 LAB — LIPASE, BLOOD: Lipase: 17 U/L — ABNORMAL LOW (ref 22–51)

## 2014-09-21 MED ORDER — MIRABEGRON ER 50 MG PO TB24
50.0000 mg | ORAL_TABLET | Freq: Every evening | ORAL | Status: DC
  Administered 2014-09-21 – 2014-09-23 (×3): 50 mg via ORAL
  Filled 2014-09-21 (×4): qty 1

## 2014-09-21 MED ORDER — ACETAMINOPHEN 650 MG RE SUPP: 650.0000 mg | Freq: Four times a day (QID) | RECTAL | Status: DC | PRN

## 2014-09-21 MED ORDER — VANCOMYCIN HCL 500 MG IV SOLR
500.0000 mg | Freq: Two times a day (BID) | INTRAVENOUS | Status: DC
  Administered 2014-09-21: 500 mg via INTRAVENOUS
  Filled 2014-09-21: qty 500

## 2014-09-21 MED ORDER — ONDANSETRON HCL 4 MG PO TABS: 4.0000 mg | ORAL_TABLET | Freq: Four times a day (QID) | ORAL | Status: DC | PRN

## 2014-09-21 MED ORDER — CEFAZOLIN SODIUM 1-5 GM-% IV SOLN
1.0000 g | Freq: Three times a day (TID) | INTRAVENOUS | Status: DC
  Administered 2014-09-21 – 2014-09-24 (×9): 1 g via INTRAVENOUS
  Filled 2014-09-21 (×10): qty 50

## 2014-09-21 MED ORDER — HYDROMORPHONE HCL 1 MG/ML IJ SOLN
0.5000 mg | INTRAMUSCULAR | Status: DC | PRN
  Administered 2014-09-21 – 2014-09-22 (×7): 0.5 mg via INTRAVENOUS
  Filled 2014-09-21 (×7): qty 1

## 2014-09-21 MED ORDER — ATENOLOL 100 MG PO TABS
100.0000 mg | ORAL_TABLET | Freq: Every morning | ORAL | Status: DC
  Filled 2014-09-21: qty 1

## 2014-09-21 MED ORDER — PIPERACILLIN-TAZOBACTAM 3.375 G IVPB 30 MIN
3.3750 g | Freq: Three times a day (TID) | INTRAVENOUS | Status: DC
  Administered 2014-09-21: 3.375 g via INTRAVENOUS
  Filled 2014-09-21 (×2): qty 50

## 2014-09-21 MED ORDER — ATENOLOL 50 MG PO TABS
50.0000 mg | ORAL_TABLET | Freq: Every morning | ORAL | Status: DC
  Administered 2014-09-22 – 2014-09-24 (×3): 50 mg via ORAL
  Filled 2014-09-21 (×3): qty 1

## 2014-09-21 MED ORDER — SODIUM CHLORIDE 0.9 % IV SOLN
INTRAVENOUS | Status: DC
  Administered 2014-09-21: 18:00:00 via INTRAVENOUS
  Administered 2014-09-21: 75 mL/h via INTRAVENOUS
  Administered 2014-09-22: 09:00:00 via INTRAVENOUS
  Administered 2014-09-23: 1000 mL via INTRAVENOUS
  Administered 2014-09-24: 10:00:00 via INTRAVENOUS

## 2014-09-21 MED ORDER — IOHEXOL 300 MG/ML  SOLN
100.0000 mL | Freq: Once | INTRAMUSCULAR | Status: AC | PRN
  Administered 2014-09-21: 100 mL via INTRAVENOUS

## 2014-09-21 MED ORDER — PANTOPRAZOLE SODIUM 40 MG PO TBEC
40.0000 mg | DELAYED_RELEASE_TABLET | Freq: Every day | ORAL | Status: DC
  Administered 2014-09-21 – 2014-09-24 (×4): 40 mg via ORAL
  Filled 2014-09-21 (×4): qty 1

## 2014-09-21 MED ORDER — ALUM & MAG HYDROXIDE-SIMETH 200-200-20 MG/5ML PO SUSP: 30.0000 mL | Freq: Four times a day (QID) | ORAL | Status: DC | PRN

## 2014-09-21 MED ORDER — ADULT MULTIVITAMIN W/MINERALS CH
1.0000 | ORAL_TABLET | Freq: Every day | ORAL | Status: DC
  Administered 2014-09-21 – 2014-09-24 (×4): 1 via ORAL
  Filled 2014-09-21 (×4): qty 1

## 2014-09-21 MED ORDER — ENOXAPARIN SODIUM 30 MG/0.3ML ~~LOC~~ SOLN
30.0000 mg | SUBCUTANEOUS | Status: DC
  Filled 2014-09-21 (×2): qty 0.3

## 2014-09-21 MED ORDER — ENOXAPARIN SODIUM 40 MG/0.4ML ~~LOC~~ SOLN
40.0000 mg | SUBCUTANEOUS | Status: DC
  Administered 2014-09-21 – 2014-09-24 (×4): 40 mg via SUBCUTANEOUS
  Filled 2014-09-21 (×4): qty 0.4

## 2014-09-21 MED ORDER — SENNOSIDES-DOCUSATE SODIUM 8.6-50 MG PO TABS: 2.0000 | ORAL_TABLET | Freq: Every day | ORAL | Status: DC | PRN

## 2014-09-21 MED ORDER — MORPHINE SULFATE ER 15 MG PO TBCR
15.0000 mg | EXTENDED_RELEASE_TABLET | Freq: Two times a day (BID) | ORAL | Status: DC
  Administered 2014-09-21 – 2014-09-24 (×7): 15 mg via ORAL
  Filled 2014-09-21 (×7): qty 1

## 2014-09-21 MED ORDER — ACETAMINOPHEN 325 MG PO TABS: 650.0000 mg | ORAL_TABLET | Freq: Four times a day (QID) | ORAL | Status: DC | PRN

## 2014-09-21 MED ORDER — VITAMIN C 500 MG PO TABS
1000.0000 mg | ORAL_TABLET | Freq: Every day | ORAL | Status: DC
  Administered 2014-09-21 – 2014-09-24 (×4): 1000 mg via ORAL
  Filled 2014-09-21 (×4): qty 2

## 2014-09-21 MED ORDER — OCUVITE-LUTEIN PO CAPS
1.0000 | ORAL_CAPSULE | Freq: Every day | ORAL | Status: DC
  Administered 2014-09-21 – 2014-09-24 (×4): 1 via ORAL
  Filled 2014-09-21 (×5): qty 1

## 2014-09-21 MED ORDER — ONDANSETRON HCL 4 MG/2ML IJ SOLN
4.0000 mg | Freq: Four times a day (QID) | INTRAMUSCULAR | Status: DC | PRN
  Administered 2014-09-21 – 2014-09-23 (×3): 4 mg via INTRAVENOUS
  Filled 2014-09-21 (×3): qty 2

## 2014-09-21 NOTE — Consult Note (Signed)
[place] for Infectious Disease     Reason for Consult: fever     Referring Physician: Dr. Wendee Beavers  Principal Problem:   SIRS (systemic inflammatory response syndrome) Active Problems:   Bronchogenic cancer of left lung   Obstructive jaundice   Malnutrition of moderate degree   Pneumonia   Non-small cell lung cancer   Abdominal pain   Fever   . [START ON 09/22/2014] atenolol  50 mg Oral q morning - 10a  . enoxaparin (LOVENOX) injection  40 mg Subcutaneous Q24H  . mirabegron ER  50 mg Oral QPM  . morphine  15 mg Oral Q12H  . multivitamin with minerals  1 tablet Oral Daily  . multivitamin-lutein  1 capsule Oral Daily  . pantoprazole  40 mg Oral Daily  . piperacillin-tazobactam  3.375 g Intravenous 3 times per day  . vitamin C  1,000 mg Oral Daily    Recommendations: Cefazolin pending cultures   Assessment: He has had a fever, was on cipro several days and persisted.  Diagnosed with 'SIRS' this am but lactate nl, WBC not elevated, not hypotensive and no AMS so do not feel broad spectrum antibiotics indicated.   Not c/w pneumonia with no SOB, no hypoxia, no cough.    No signs of line infection but will wait for blood cultures.    Antibiotics: Vancomycin and zosyn x 1 dose  HPI: [name] is a 69 y.o. male with lung cancer on palliative chemotherapy came to ED with fever.  He has a biliary stent placed due to obstruction with persistently elevated bilirubin, LFTs and concern with infection and started 4 days ago on cipro.  His fever persisted, some chills and came to ED.  Feels somewhat better this am.  No fever here.  Cultures sent.  No pus out of port a cath, just difficulty getting blood out.  Tolerating a small amount of po intake.     Review of Systems: A comprehensive review of systems was negative.  Past Medical History  Diagnosis Date  . Hypertension   . Arthritis RIGHT SHOULDER  . Immature cataract BILATERAL  . Non-small cell lung cancer DX SEPT  2010  W/ CHEMORADIATION AT THAT TIME -- NOW  W/ METS--  CURRENTLY ON MAINTENANCE CHEMO TX  EVERY 30 DAYS    ONCOLOGIST- DR [facility]  . Acute meniscal tear of knee RIGHT KNEE  . BPH (benign prostatic hypertrophy)   . Dizziness and giddiness 04/23/2014  . Tremor 04/23/2014    Right hand  . HOH (hard of hearing)     bilateral hearing aids  . Radiation 01/08/09-01/21/09    Mediastinum 30 Gy x 12 fractions    History  Substance Use Topics  . Smoking status: Former Smoker -- 2.00 packs/day for 20 years    Types: Cigarettes    Quit date: 03/17/1981  . Smokeless tobacco: Never Used  . Alcohol Use: 3.5 oz/week    7 Standard drinks or equivalent per week     Comment: beer daily    Family History  Problem Relation Age of Onset  . Colon cancer    . Heart attack    . Cancer    . Hypertension    . Hyperlipidemia    . Congestive Heart Failure Mother   . Cancer Sister   . Cancer Maternal Grandfather   . Colon cancer Father   . Heart attack Brother    Allergies  Allergen Reactions  . Augmentin [Amoxicillin-Pot Clavulanate] Diarrhea  .  Carboplatin     Rash and itching after carbo test dose  . Levaquin [Levofloxacin In D5w]     Knee pain  . Percocet [Oxycodone-Acetaminophen] Nausea And Vomiting  . Zolpidem Tartrate Diarrhea    OBJECTIVE: Blood pressure 124/68, pulse 80, temperature 98.9 F (37.2 C), temperature source Oral, resp. rate 16, height '5\' 8"'$  (1.727 m), weight 120 lb 5.9 oz (54.6 kg), SpO2 100 %. General: awake, alert, nad, chronically ill appearing Skin: no rashes Lungs: CTA B Cor: RRR without m Abdomen: soft, tenderness right side Chest: port a cath with no erythema, no pus, non tender  Microbiology: No results found for this or any previous visit (from the past 240 hour(s)).  Scharlene Gloss, [place] for Infectious Disease [place] [email or web address] [phone] pager  [phone] cell 09/21/2014, 1:20 PM

## 2014-09-21 NOTE — H&P (Signed)
[facility] Admission History and Physical       Murat Rideout IOX:[id number] DOB: September 03, 1945 DOA: 09/20/2014  Referring physician: EDP PCP: [name], MD  Specialists:   Chief Complaint: Fever and ABD Pain  HPI: [name] is a 69 y.o. male with a history of Metastatic Lung Cancer dx in 11/2008 currently on Palliative Radiotherapy and Immunotherapy presenting to the Ed with complaints of  Fevers  And chills x 10 days and recent biliary stent site infection for which he was started on oral Cipro therapy.   He reports having a temperature to 101.7 today.   He also reports having increased pain in the RUQ ABD just beneath his rib edge along with nausea.   He denies any vomiting.  He has has poor intake of foods and liquids.     Review of Systems:  Constitutional: No Weight Loss, No Weight Gain, Night Sweats, +Fevers, Chills, Dizziness, Light Headedness, +Fatigue,  +Generalized Weakness HEENT: No Headaches, Difficulty Swallowing,Tooth/Dental Problems,Sore Throat,  No Sneezing, Rhinitis, Ear Ache, Nasal Congestion, or Post Nasal Drip,  Cardio-vascular:  No Chest pain, Orthopnea, PND, Edema in Lower Extremities, Anasarca, Dizziness, Palpitations  Resp: No Dyspnea, No DOE, No Productive Cough, No Non-Productive Cough, No Hemoptysis, No Wheezing.    GI: No Heartburn, Indigestion, +Abdominal Pain, Nausea, Vomiting, Diarrhea, Constipation, Hematemesis, Hematochezia, Melena, Change in Bowel Habits,  +Loss of Appetite  GU: No Dysuria, No Change in Color of Urine, No Urgency or Urinary Frequency, No Flank pain.  Musculoskeletal: No Joint Pain or Swelling, No Decreased Range of Motion, No Back Pain.  Neurologic: No Syncope, No Seizures, Muscle Weakness, Paresthesia, Vision Disturbance or Loss, No Diplopia, No Vertigo, No Difficulty Walking,  Skin: No Rash or Lesions. Psych: No Change in Mood or Affect, No Depression or Anxiety, No Memory loss, No Confusion, or  Hallucinations   Past Medical History  Diagnosis Date  . Hypertension   . Arthritis RIGHT SHOULDER  . Immature cataract BILATERAL  . Non-small cell lung cancer DX SEPT 2010  W/ CHEMORADIATION AT THAT TIME -- NOW  W/ METS--  CURRENTLY ON MAINTENANCE CHEMO TX  EVERY 30 DAYS    ONCOLOGIST- [facility]  . Acute meniscal tear of knee RIGHT KNEE  . BPH (benign prostatic hypertrophy)   . Dizziness and giddiness 04/23/2014  . Tremor 04/23/2014    Right hand  . HOH (hard of hearing)     bilateral hearing aids  . Radiation 01/08/09-01/21/09    Mediastinum 30 Gy x 12 fractions     Past Surgical History  Procedure Laterality Date  . 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 Rotator cuff repair  2008    LEFT SHOULDER  . Bilateral ear drum surgery  1960'S  . Orif left ring finger  and revascularization of radial side  02-16-2007    DEGLOVING INJURY  . Knee arthroscopy  12/21/2011    Procedure: ARTHROSCOPY KNEE;  Surgeon: Tobi Bastos, MD;  Location: [facility];  Service: Orthopedics;  Laterality: Right;  WITH MEDIAL MENISECTOMY  . Cataract extraction Bilateral   . Tonsillectomy      as child  . Ovarian cyst surgery      removed from left jaw area- benign  . Ovarian cyst surgery       NOTE; pt unaware of why this is in his chart  . Shoulder open rotator cuff repair Right 06/05/2014  Procedure: RIGHT ROTATOR CUFF REPAIR SHOULDER OPEN;  Surgeon: Latanya Maudlin, MD;  Location: [facility];  Service: Orthopedics;  Laterality: Right;  . Ercp N/A 08/09/2014    Procedure: ENDOSCOPIC RETROGRADE CHOLANGIOPANCREATOGRAPHY (ERCP);  Surgeon: Carol Ada, MD;  Location: [facility];  Service: Endoscopy;  Laterality: N/A;  . Eus N/A 08/09/2014    Procedure: UPPER ENDOSCOPIC ULTRASOUND (EUS) RADIAL;  Surgeon: Carol Ada, MD;  Location: [facility];  Service: Endoscopy;  Laterality: N/A;  . Fine needle aspiration N/A  08/09/2014    Procedure: FINE NEEDLE ASPIRATION (FNA) LINEAR;  Surgeon: Carol Ada, MD;  Location: [facility];  Service: Endoscopy;  Laterality: N/A;  . Esophagogastroduodenoscopy (egd) with propofol N/A 08/09/2014    Procedure: ESOPHAGOGASTRODUODENOSCOPY (EGD) WITH PROPOFOL;  Surgeon: Carol Ada, MD;  Location: [facility];  Service: Endoscopy;  Laterality: N/A;      Prior to Admission medications   Medication Sig Start Date End Date Taking? Authorizing Provider  Ascorbic Acid (VITAMIN C) 1000 MG tablet Take 1,000 mg by mouth daily.   Yes Historical Provider, MD  atenolol (TENORMIN) 100 MG tablet Take 100 mg by mouth every morning.    Yes Historical Provider, MD  ciprofloxacin (CIPRO) 500 MG tablet Take 1 tablet (500 mg total) by mouth 2 (two) times daily. 09/17/14  Yes [name], MD  FeFum-FePoly-FA-B Cmp-C-[facility]) CAPS Take 1 capsule by mouth daily. 11/13/13  Yes [name], MD  HYDROmorphone (DILAUDID) 4 MG tablet Take 1 tablet (4 mg total) by mouth every 6 (six) hours as needed for moderate pain or severe pain. 09/10/14  Yes Adrena E Johnson, PA-C  morphine (MS CONTIN) 15 MG 12 hr tablet Take 1 tablet (15 mg total) by mouth every 12 (twelve) hours. 09/02/14  Yes Carlton Adam, PA-C  Multiple Vitamin (MULTIVITAMIN) tablet Take 1 tablet by mouth daily.    Yes Historical Provider, MD  MYRBETRIQ 50 MG TB24 tablet Take 50 mg by mouth every evening.  01/09/14  Yes Historical Provider, MD  ondansetron (ZOFRAN) 8 MG tablet Take 1 tablet (8 mg total) by mouth every 8 (eight) hours as needed for nausea or vomiting. 09/10/14  Yes Adrena E Johnson, PA-C  Sennosides-Docusate Sodium (SENOKOT S PO) Take 2 tablets by mouth daily as needed (constipation).    Yes Historical Provider, MD  omeprazole (PRILOSEC) 40 MG capsule Take 1 capsule (40 mg total) by mouth every evening. Patient taking differently: Take 40 mg by mouth every morning.  11/13/13   [name], MD   prochlorperazine (COMPAZINE) 10 MG tablet Take 1 tablet (10 mg total) by mouth every 6 (six) hours as needed. Patient taking differently: Take 10 mg by mouth every 6 (six) hours as needed for nausea or vomiting.  08/27/14   [name], MD     Allergies  Allergen Reactions  . Augmentin [Amoxicillin-Pot Clavulanate] Diarrhea  . Carboplatin     Rash and itching after carbo test dose  . Levaquin [Levofloxacin In D5w]     Knee pain  . Percocet [Oxycodone-Acetaminophen] Nausea And Vomiting  . Zolpidem Tartrate Diarrhea    Social History:  reports that he quit smoking about 33 years ago. His smoking use included Cigarettes. He has a 40 pack-year smoking history. He has never used smokeless tobacco. He reports that he drinks about 3.5 oz of alcohol per week. He reports that he does not use illicit drugs.    Family History  Problem Relation Age of Onset  . Colon cancer    . Heart  attack    . Cancer    . Hypertension    . Hyperlipidemia    . Congestive Heart Failure Mother   . Cancer Sister   . Cancer Maternal Grandfather   . Colon cancer Father   . Heart attack Brother        Physical Exam:  GEN:  Pleasant  Cachectic  69 y.o. Caucasian male examined and in no acute distress; cooperative with exam Filed Vitals:   09/20/14 2115 09/20/14 2246  BP: 118/72   Pulse: 77   Temp: 98.2 F (36.8 C)   TempSrc: Oral   Resp: 18   Height:  '5\' 8"'$  (1.727 m)  Weight: 52.164 kg (115 lb) 52.164 kg (115 lb)  SpO2: 95%    Blood pressure 118/72, pulse 77, temperature 98.2 F (36.8 C), temperature source Oral, resp. rate 18, height '5\' 8"'$  (1.727 m), weight 52.164 kg (115 lb), SpO2 95 %. PSYCH: He is alert and oriented x4; does not appear anxious does not appear depressed; affect is normal HEENT: Normocephalic and Atraumatic, Mucous membranes pink; PERRLA; EOM intact; Fundi:  Benign;  + Scleral icterus, Nares: Patent, Oropharynx: Clear, Sparse Fair Dentition,    Neck:  FROM, No Cervical  Lymphadenopathy nor Thyromegaly or Carotid Bruit; No JVD; Breasts:: Not examined CHEST WALL: No tenderness CHEST: Normal respiration, clear to auscultation bilaterally HEART: Regular rate and rhythm; no murmurs rubs or gallops BACK: No kyphosis or scoliosis; No CVA tenderness ABDOMEN: Positive Bowel Sounds, Scaphoid, Soft Non-Tender, No Rebound or Guarding; No Masses, No Organomegaly Rectal Exam: Not done EXTREMITIES: No Cyanosis, Clubbing, or Edema; No Ulcerations. Genitalia: not examined PULSES: 2+ and symmetric SKIN: Normal hydration no rash or ulceration CNS:  Alert and Oriented x 4, No Focal Deficits except Decreased hearing LEft Ear Vascular: pulses palpable throughout    Labs on Admission:  Basic Metabolic Panel:  Recent Labs Lab 09/20/14 2205  NA 139  K 4.9  CL 107  CO2 25  GLUCOSE 150*  BUN 12  CREATININE 1.28*  CALCIUM 8.8*   Liver Function Tests:  Recent Labs Lab 09/20/14 2205  AST 91*  ALT 88*  ALKPHOS 636*  BILITOT 4.8*  PROT 6.7  ALBUMIN 2.7*    Recent Labs Lab 09/20/14 2353  LIPASE 17*   No results for input(s): AMMONIA in the last 168 hours. CBC:  Recent Labs Lab 09/20/14 2205  WBC 8.0  NEUTROABS 6.3  HGB 10.1*  HCT 31.3*  MCV 98.7  PLT 224   Cardiac Enzymes: No results for input(s): CKTOTAL, CKMB, CKMBINDEX, TROPONINI in the last 168 hours.  BNP (last 3 results) No results for input(s): BNP in the last 8760 hours.  ProBNP (last 3 results) No results for input(s): PROBNP in the last 8760 hours.  CBG: No results for input(s): GLUCAP in the last 168 hours.  Radiological Exams on Admission: Dg Chest 2 View  09/20/2014   CLINICAL DATA:  Left-sided drain infection. Right-sided abdominal pain and fever. Initial encounter.  EXAM: CHEST  2 VIEW  COMPARISON:  Chest radiograph performed 08/25/2014  FINDINGS: The lungs are well-aerated. A new small right pleural effusion is noted, with associated airspace opacity. The left lung appears  clear. No pneumothorax is identified.  The heart remains normal in size. A right-sided chest port is noted ending about the distal SVC. No acute osseous abnormalities are identified.  IMPRESSION: New small right pleural effusion, with associated airspace opacity.   Electronically Signed   By: Francoise Schaumann.D.  On: 09/20/2014 22:13   Ct Abdomen Pelvis W Contrast  09/21/2014   CLINICAL DATA:  Acute onset of right-sided abdominal pain. Fever. Initial encounter.  EXAM: CT ABDOMEN AND PELVIS WITH CONTRAST  TECHNIQUE: Multidetector CT imaging of the abdomen and pelvis was performed using the standard protocol following bolus administration of intravenous contrast.  CONTRAST:  170m OMNIPAQUE IOHEXOL 300 MG/ML  SOLN  COMPARISON:  CT of the abdomen and pelvis from 09/09/2014  FINDINGS: A small right-sided pleural effusion is noted, with associated atelectasis. Diffuse coronary artery calcifications are seen. Trace pericardial fluid remains within normal limits.  There is mild stable underlying prominence of the intrahepatic biliary ducts. A biliary stent appears patent, and is filled with air. Mild left-sided pneumobilia is again noted. An underlying large mass is again seen at the porta hepatis, measuring approximately 6.0 x 4.5 x 4.6 cm, compatible with metastatic nodal disease as previously described.  Trace pericholecystic fluid is nonspecific given the adjacent metastasis, and appears stable from the prior study. The gallbladder is otherwise unremarkable. The pancreas and adrenal glands are within normal limits.  Additional periaortic and paracaval nodes, measuring up to 1.5 cm in short axis, are also concerning for metastatic disease, and appear grossly stable from the prior study.  A 1.8 cm cyst is noted at the interpole region of the left kidney, with mild scarring at the upper pole of the right kidney. Mild nonspecific perinephric stranding is noted bilaterally. There is no evidence of hydronephrosis. No  renal or ureteral stones are seen.  No free fluid is identified. The small bowel is unremarkable in appearance. The stomach is within normal limits. No acute vascular abnormalities are seen. Diffuse calcification is seen along the abdominal aorta, with mild associated mural thrombus.  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  The appendix is not definitely characterized; there is no evidence of appendicitis. The colon is unremarkable in appearance.  The bladder is mildly distended and grossly unremarkable in appearance. The prostate is normal in size, with scattered calcification. No inguinal lymphadenopathy is seen.  No acute osseous abnormalities are identified.  IMPRESSION: 1. No new abnormality seen to explain the patient's symptoms. 2. Relatively stable appearance to marked lymphadenopathy at the porta hepatis, and persistent retroperitoneal metastatic lymphadenopathy. 3. Common bile duct stent is stable in appearance, with mild stable dilatation of intrahepatic biliary ducts and left-sided pneumobilia. 4. Persistent small right pleural effusion and associated atelectasis, slightly increased from the prior study. 5. Diffuse coronary artery calcifications seen. 6. Trace pericholecystic fluid is stable from the prior study, and is nonspecific given the adjacent metastasis at the porta hepatis. 7. 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 Recommend interval follow-up was discussed on the prior study. 8. Diffuse calcification along the abdominal aorta and its branches, with mild associated mural thrombus.   Electronically Signed   By: JGarald BaldingM.D.   On: 09/21/2014 01:15          Assessment/Plan:      69y.o. male with  Principal Problem:   1.    SIRS (systemic inflammatory response syndrome)- Early Sepsis   Sites- Pneumonia, and From Biliary  Stent site   IV Vancomycin and Zosyn   Active Problems:   2.    Pneumonia   IV Vancomycin and Zosyn     3.    Bronchogenic cancer of left  lung/ Non-small cell lung cancer/ Metastatic Disease   Notify Oncology in AM     4.   Obstructive jaundice- due to Cancer, Previous Biliary Stent   Bilirubin Elevated but lower than previous        5.   Malnutrition of moderate degree   Nutrition consult for needs     6.  DVT Prophylaxis   Lovenox    Code Status:     FULL CODE       Family Communication:   Wife and Daughter at Bedside     Disposition Plan:    Inpatient  Status        Time spent:  [facility] Pager [phone]   If [place] Please Contact the Day Rounding Team MD for [facility]  If 7PM-7AM, Please Contact Night-Floor Coverage  [email or web address] Password [email or web address] 09/21/2014, 1:34 AM     ADDENDUM:   Patient was seen and examined on 09/21/2014

## 2014-09-21 NOTE — Progress Notes (Signed)
Patient seen earlier this AM by my associate. Please refer to his H and P regarding assessment and plan.  For now will continue broad spectrum antibiotics. Source most likely from recent infected biliary stent. Will consult ID to assist with recommendations regarding choice of antibiotic and length of treatment.  Rickayla Wieland, [employer]

## 2014-09-21 NOTE — Progress Notes (Signed)
ANTIBIOTIC CONSULT NOTE - INITIAL  Pharmacy Consult for Vancomycin and Zosyn  Indication: pneumonia, sepsis  Allergies  Allergen Reactions  . Augmentin [Amoxicillin-Pot Clavulanate] Diarrhea  . Carboplatin     Rash and itching after carbo test dose  . Levaquin [Levofloxacin In D5w]     Knee pain  . Percocet [Oxycodone-Acetaminophen] Nausea And Vomiting  . Zolpidem Tartrate Diarrhea    Patient Measurements: Height: '5\' 8"'$  (172.7 cm) Weight: 120 lb 5.9 oz (54.6 kg) IBW/kg (Calculated) : 68.4 Adjusted Body Weight:   Vital Signs: Temp: 98.1 F (36.7 C) (06/25 0154) Temp Source: Oral (06/25 0154) BP: 137/83 mmHg (06/25 0154) Pulse Rate: 85 (06/25 0154) Intake/Output from previous day:   Intake/Output from this shift:    Labs:  Recent Labs  09/20/14 2205  WBC 8.0  HGB 10.1*  PLT 224  CREATININE 1.28*   Estimated Creatinine Clearance: 42.1 mL/min (by C-G formula based on Cr of 1.28). No results for input(s): VANCOTROUGH, VANCOPEAK, VANCORANDOM, GENTTROUGH, GENTPEAK, GENTRANDOM, TOBRATROUGH, TOBRAPEAK, TOBRARND, AMIKACINPEAK, AMIKACINTROU, AMIKACIN in the last 72 hours.   Microbiology: No results found for this or any previous visit (from the past 720 hour(s)).  Medical History: Past Medical History  Diagnosis Date  . Hypertension   . Arthritis RIGHT SHOULDER  . Immature cataract BILATERAL  . Non-small cell lung cancer DX SEPT 2010  W/ CHEMORADIATION AT THAT TIME -- NOW  W/ METS--  CURRENTLY ON MAINTENANCE CHEMO TX  EVERY 30 DAYS    ONCOLOGIST- DR [facility]  . Acute meniscal tear of knee RIGHT KNEE  . BPH (benign prostatic hypertrophy)   . Dizziness and giddiness 04/23/2014  . Tremor 04/23/2014    Right hand  . HOH (hard of hearing)     bilateral hearing aids  . Radiation 01/08/09-01/21/09    Mediastinum 30 Gy x 12 fractions    Medications:  Anti-infectives    Start     Dose/Rate Route Frequency Ordered Stop   09/21/14 1200  vancomycin (VANCOCIN) 500 mg in  sodium chloride 0.9 % 100 mL IVPB     500 mg 100 mL/hr over 60 Minutes Intravenous Every 12 hours 09/21/14 0430     09/21/14 0600  piperacillin-tazobactam (ZOSYN) IVPB 3.375 g     3.375 g 12.5 mL/hr over 240 Minutes Intravenous 3 times per day 09/21/14 0156     09/20/14 2245  vancomycin (VANCOCIN) IVPB 1000 mg/200 mL premix     1,000 mg 200 mL/hr over 60 Minutes Intravenous  Once 09/20/14 2234 09/21/14 0100   09/20/14 2245  piperacillin-tazobactam (ZOSYN) IVPB 3.375 g     3.375 g 100 mL/hr over 30 Minutes Intravenous  Once 09/20/14 2234 09/21/14 0000     Assessment: Patient with metastatic lung cancer with recent biliary stent infection.  Now with PNA and sepsis.  First dose of antibiotics already given.  Goal of Therapy:  Vancomycin trough level 15-20 mcg/ml  Zosyn based on renal function   Plan:  Measure antibiotic drug levels at steady state Follow up culture results vancomycin '500mg'$  iv q12hr  Zosyn 3.375g IV Q8H infused over 4hrs.   Tyler Deis, Shea Stakes [place] 09/21/2014,4:31 AM

## 2014-09-21 NOTE — Progress Notes (Signed)
ANTIBIOTIC CONSULT NOTE - INITIAL  Pharmacy Consult for Cefazolin Indication: biliary infection  Allergies  Allergen Reactions  . Augmentin [Amoxicillin-Pot Clavulanate] Diarrhea  . Carboplatin     Rash and itching after carbo test dose  . Levaquin [Levofloxacin In D5w]     Knee pain  . Percocet [Oxycodone-Acetaminophen] Nausea And Vomiting  . Zolpidem Tartrate Diarrhea    Patient Measurements: Height: '5\' 8"'$  (172.7 cm) Weight: 120 lb 5.9 oz (54.6 kg) IBW/kg (Calculated) : 68.4 Adjusted Body Weight:   Vital Signs: Temp: 98.9 F (37.2 C) (06/25 0629) Temp Source: Oral (06/25 0629) BP: 124/68 mmHg (06/25 0629) Pulse Rate: 80 (06/25 0629) Intake/Output from previous day: 06/24 0701 - 06/25 0700 In: -  Out: 450 [Urine:450] Intake/Output from this shift:    Labs:  Recent Labs  09/20/14 2205 09/21/14 0406  WBC 8.0 6.5  HGB 10.1* 7.5*  PLT 224 183  CREATININE 1.28* 1.18   Estimated Creatinine Clearance: 45.6 mL/min (by C-G formula based on Cr of 1.18). No results for input(s): VANCOTROUGH, VANCOPEAK, VANCORANDOM, GENTTROUGH, GENTPEAK, GENTRANDOM, TOBRATROUGH, TOBRAPEAK, TOBRARND, AMIKACINPEAK, AMIKACINTROU, AMIKACIN in the last 72 hours.   Microbiology: No results found for this or any previous visit (from the past 720 hour(s)).  Medical History: Past Medical History  Diagnosis Date  . Hypertension   . Arthritis RIGHT SHOULDER  . Immature cataract BILATERAL  . Non-small cell lung cancer DX SEPT 2010  W/ CHEMORADIATION AT THAT TIME -- NOW  W/ METS--  CURRENTLY ON MAINTENANCE CHEMO TX  EVERY 30 DAYS    ONCOLOGIST- DR [facility]  . Acute meniscal tear of knee RIGHT KNEE  . BPH (benign prostatic hypertrophy)   . Dizziness and giddiness 04/23/2014  . Tremor 04/23/2014    Right hand  . HOH (hard of hearing)     bilateral hearing aids  . Radiation 01/08/09-01/21/09    Mediastinum 30 Gy x 12 fractions   Medications:  Anti-infectives    Start     Dose/Rate Route  Frequency Ordered Stop   09/21/14 1200  vancomycin (VANCOCIN) 500 mg in sodium chloride 0.9 % 100 mL IVPB  Status:  Discontinued     500 mg 100 mL/hr over 60 Minutes Intravenous Every 12 hours 09/21/14 0430 09/21/14 1320   09/21/14 0600  piperacillin-tazobactam (ZOSYN) IVPB 3.375 g     3.375 g 12.5 mL/hr over 240 Minutes Intravenous 3 times per day 09/21/14 0156     09/20/14 2245  vancomycin (VANCOCIN) IVPB 1000 mg/200 mL premix     1,000 mg 200 mL/hr over 60 Minutes Intravenous  Once 09/20/14 2234 09/21/14 0100   09/20/14 2245  piperacillin-tazobactam (ZOSYN) IVPB 3.375 g     3.375 g 100 mL/hr over 30 Minutes Intravenous  Once 09/20/14 2234 09/21/14 0000     Assessment: Patient with recent biliary stent infection, treated with Cipro.  Started on Vancomycin & Zosyn for SIRS, rule out PNA. Other hx: metastatic Lung Ca on palliative XRT, Opdivo. Did not meet criteria for SIRS, doubt PNA, begin Cefazolin per ID.  Goal of Therapy:  Appropriate antibiotic for treatment, dose and schedule appropriate for renal function  Plan:   Ancef 1gm q8hr  Follow cultures  Minda Ditto PharmD Pager [phone] 09/21/2014, 2:21 PM

## 2014-09-21 NOTE — Progress Notes (Signed)
Pt on a liquid diet and  requesting regular food to eat. MD made aware. New order given. Vwilliams,rn.

## 2014-09-21 NOTE — Progress Notes (Signed)
Pt reports he takes 50 mg of atenolol and not a 100 mg as has been ordered this admission. MD made aware. New order given for 50 mg atenolol. Order placed by this RN and pharmacy made aware. Vwilliams,rn.

## 2014-09-22 DIAGNOSIS — K829 Disease of gallbladder, unspecified: Secondary | ICD-10-CM

## 2014-09-22 LAB — CBC
HEMATOCRIT: 22.5 % — AB (ref 39.0–52.0)
HEMATOCRIT: 27.1 % — AB (ref 39.0–52.0)
Hemoglobin: 7.3 g/dL — ABNORMAL LOW (ref 13.0–17.0)
Hemoglobin: 8.9 g/dL — ABNORMAL LOW (ref 13.0–17.0)
MCH: 31.3 pg (ref 26.0–34.0)
MCH: 31.3 pg (ref 26.0–34.0)
MCHC: 32.4 g/dL (ref 30.0–36.0)
MCHC: 32.8 g/dL (ref 30.0–36.0)
MCV: 96.6 fL (ref 78.0–100.0)
MCV: 95.4 fL (ref 78.0–100.0)
PLATELETS: 139 10*3/uL — AB (ref 150–400)
Platelets: 175 10*3/uL (ref 150–400)
RBC: 2.33 MIL/uL — AB (ref 4.22–5.81)
RBC: 2.84 MIL/uL — AB (ref 4.22–5.81)
RDW: 16.8 % — ABNORMAL HIGH (ref 11.5–15.5)
RDW: 16.7 % — ABNORMAL HIGH (ref 11.5–15.5)
WBC: 5.8 10*3/uL (ref 4.0–10.5)
WBC: 7.3 10*3/uL (ref 4.0–10.5)

## 2014-09-22 LAB — URINE CULTURE: Culture: NO GROWTH

## 2014-09-22 LAB — IRON AND TIBC: IRON: 16 ug/dL — AB (ref 45–182)

## 2014-09-22 LAB — RETICULOCYTES
RBC.: 2.33 MIL/uL — ABNORMAL LOW (ref 4.22–5.81)
Retic Count, Absolute: 62.9 10*3/uL (ref 19.0–186.0)
Retic Ct Pct: 2.7 % (ref 0.4–3.1)

## 2014-09-22 LAB — FOLATE: FOLATE: 26 ng/mL (ref >5.9)

## 2014-09-22 LAB — VITAMIN B12: Vitamin B-12: 754 pg/mL (ref 180–914)

## 2014-09-22 LAB — FERRITIN: Ferritin: 1447 ng/mL — ABNORMAL HIGH (ref 24–336)

## 2014-09-22 LAB — PREPARE RBC (CROSSMATCH)

## 2014-09-22 MED ORDER — SODIUM CHLORIDE 0.9 % IV SOLN
Freq: Once | INTRAVENOUS | Status: AC
  Administered 2014-09-22: 17:00:00 via INTRAVENOUS

## 2014-09-22 NOTE — Progress Notes (Signed)
[facility]  Date of Admission:  09/20/2014  Antibiotics: cefazolin  Subjective: Feels "lousy"  Objective: Temp:  [98.2 F (36.8 C)-99.9 F (37.7 C)] 98.2 F (36.8 C) (06/26 0448) Pulse Rate:  [76-86] 81 (06/26 0448) Resp:  [16-18] 17 (06/26 0448) BP: (135-148)/(67-81) 146/81 mmHg (06/26 0448) SpO2:  [93 %-97 %] 97 % (06/26 0448) Weight:  [116 lb 2.9 oz (52.7 kg)] 116 lb 2.9 oz (52.7 kg) (06/26 0448)  General: awake, alert, nad, chronically ill appearing Skin: No rashes Lungs: CTA  Cor: RRR Abdomen: soft, diffusely tender, nd Ext: no edema  Lab Results Lab Results  Component Value Date   WBC 6.5 09/21/2014   HGB 7.5* 09/21/2014   HCT 23.3* 09/21/2014   MCV 98.3 09/21/2014   PLT 183 09/21/2014    Lab Results  Component Value Date   CREATININE 1.18 09/21/2014   BUN 12 09/21/2014   NA 135 09/21/2014   K 3.8 09/21/2014   CL 103 09/21/2014   CO2 26 09/21/2014    Lab Results  Component Value Date   ALT 88* 09/20/2014   AST 91* 09/20/2014   ALKPHOS 636* 09/20/2014   BILITOT 4.8* 09/20/2014      Microbiology: Recent Results (from the past 240 hour(s))  Culture, blood (routine x 2)     Status: None (Preliminary result)   Collection Time: 09/20/14 10:05 PM  Result Value Ref Range Status   Specimen Description BLOOD RIGHT ANTECUBITAL  Final   Special Requests BOTTLES DRAWN AEROBIC AND ANAEROBIC 5ML  Final   Culture   Final    NO GROWTH 1 DAY Performed at [facility]    Report Status PENDING  Incomplete  Culture, blood (routine x 2)     Status: None (Preliminary result)   Collection Time: 09/20/14 10:40 PM  Result Value Ref Range Status   Specimen Description BLOOD RHAND  Final   Special Requests BOTTLES DRAWN AEROBIC AND ANAEROBIC 5CC  Final   Culture   Final    NO GROWTH 1 DAY Performed at [facility]    Report Status PENDING  Incomplete  Urine culture     Status: None   Collection Time: 09/21/14 12:35 AM   Result Value Ref Range Status   Specimen Description URINE, CLEAN CATCH  Final   Special Requests NONE  Final   Culture   Final    NO GROWTH 1 DAY Performed at [facility]    Report Status 09/22/2014 FINAL  Final    Studies/Results: Dg Chest 2 View  09/20/2014   CLINICAL DATA:  Left-sided drain infection. Right-sided abdominal pain and fever. Initial encounter.  EXAM: CHEST  2 VIEW  COMPARISON:  Chest radiograph performed 08/25/2014  FINDINGS: The lungs are well-aerated. A new small right pleural effusion is noted, with associated airspace opacity. The left lung appears clear. No pneumothorax is identified.  The heart remains normal in size. A right-sided chest port is noted ending about the distal SVC. No acute osseous abnormalities are identified.  IMPRESSION: New small right pleural effusion, with associated airspace opacity.   Electronically Signed   By: Garald Balding M.D.   On: 09/20/2014 22:13   Ct Abdomen Pelvis W Contrast  09/21/2014   CLINICAL DATA:  Acute onset of right-sided abdominal pain. Fever. Initial encounter.  EXAM: CT ABDOMEN AND PELVIS WITH CONTRAST  TECHNIQUE: Multidetector CT imaging of the abdomen and pelvis was performed using the standard protocol following bolus administration of  intravenous contrast.  CONTRAST:  189m OMNIPAQUE IOHEXOL 300 MG/ML  SOLN  COMPARISON:  CT of the abdomen and pelvis from 09/09/2014  FINDINGS: A small right-sided pleural effusion is noted, with associated atelectasis. Diffuse coronary artery calcifications are seen. Trace pericardial fluid remains within normal limits.  There is mild stable underlying prominence of the intrahepatic biliary ducts. A biliary stent appears patent, and is filled with air. Mild left-sided pneumobilia is again noted. An underlying large mass is again seen at the porta hepatis, measuring approximately 6.0 x 4.5 x 4.6 cm, compatible with metastatic nodal disease as previously described.  Trace pericholecystic  fluid is nonspecific given the adjacent metastasis, and appears stable from the prior study. The gallbladder is otherwise unremarkable. The pancreas and adrenal glands are within normal limits.  Additional periaortic and paracaval nodes, measuring up to 1.5 cm in short axis, are also concerning for metastatic disease, and appear grossly stable from the prior study.  A 1.8 cm cyst is noted at the interpole region of the left kidney, with mild scarring at the upper pole of the right kidney. Mild nonspecific perinephric stranding is noted bilaterally. There is no evidence of hydronephrosis. No renal or ureteral stones are seen.  No free fluid is identified. The small bowel is unremarkable in appearance. The stomach is within normal limits. No acute vascular abnormalities are seen. Diffuse calcification is seen along the abdominal aorta, with mild associated mural thrombus.  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  The appendix is not definitely characterized; there is no evidence of appendicitis. The colon is unremarkable in appearance.  The bladder is mildly distended and grossly unremarkable in appearance. The prostate is normal in size, with scattered calcification. No inguinal lymphadenopathy is seen.  No acute osseous abnormalities are identified.  IMPRESSION: 1. No new abnormality seen to explain the patient's symptoms. 2. Relatively stable appearance to marked lymphadenopathy at the porta hepatis, and persistent retroperitoneal metastatic lymphadenopathy. 3. Common bile duct stent is stable in appearance, with mild stable dilatation of intrahepatic biliary ducts and left-sided pneumobilia. 4. Persistent small right pleural effusion and associated atelectasis, slightly increased from the prior study. 5. Diffuse coronary artery calcifications seen. 6. Trace pericholecystic fluid is stable from the prior study, and is  nonspecific given the adjacent metastasis at the porta hepatis. 7. 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 Recommend interval follow-up was discussed on the prior study. 8. Diffuse calcification along the abdominal aorta and its branches, with mild associated mural thrombus.   Electronically Signed   By: JGarald BaldingM.D.   On: 09/21/2014 01:15    Assessment/Plan:  1) biliary infection - has remained afebrile on cefazolin.  Feeling lousy now but no specific complaints.  Likely a process of his underlying illness.  Could change to keflex po at discharge.  Would treat about 4 more days.   Dr. HJohnnye Simais available tomorrow if needed but otherwise will sign off. thanks  CScharlene Gloss MMelfafor Infectious Disease [email or web address] [phone]   [phone] 09/22/2014, 2:00 PM

## 2014-09-22 NOTE — Progress Notes (Signed)
Initial Nutrition Assessment  DOCUMENTATION CODES:  Severe malnutrition in context of chronic illness, Underweight  INTERVENTION:  Provide Magic cup TID with meals, each supplement provides 290 kcal and 9 grams of protein Provide [employer] Breakfast daily, provides 220 kcal and 13g of protein Encourage PO intake RD to continue to monitor  NUTRITION DIAGNOSIS:  Malnutrition related to chronic illness as evidenced by energy intake < or equal to 75% for > or equal to 1 month, moderate depletion of body fat, severe depletion of muscle mass, percent weight loss.  GOAL:  Patient will meet greater than or equal to 90% of their needs  MONITOR:  PO intake, Supplement acceptance, Labs, Weight trends, Skin, I & O's  REASON FOR ASSESSMENT:  Malnutrition Screening Tool    ASSESSMENT: 69 y.o. male with a history of Metastatic Lung Cancer dx in 11/2008 currently on Palliative Radiotherapy and Immunotherapy presenting to the Ed with complaints of Fevers. He has has poor intake of foods and liquids.  Pt reports continued poor PO intake and further weight loss. PO intake: 25-65% Pt reports he has been drinking supplements at home. Pt prefers regular Ensure vs complete or Enlive. Pt will drink [employer]. Pt has lost 17 lb since 5/13 (13% weight loss x 1.5 months, significant from time frame).  Nutrition-Focused physical exam completed. Findings are mild fat depletion, severe muscle depletion, and no edema.   Labs reviewed.  Height:  Ht Readings from Last 1 Encounters:  09/21/14 '5\' 8"'$  (1.727 m)    Weight:  Wt Readings from Last 1 Encounters:  09/22/14 116 lb 2.9 oz (52.7 kg)    Ideal Body Weight:  70 kg  Wt Readings from Last 10 Encounters:  09/22/14 116 lb 2.9 oz (52.7 kg)  09/09/14 121 lb 6.4 oz (55.067 kg)  09/05/14 118 lb (53.524 kg)  09/03/14 121 lb 3.2 oz (54.976 kg)  08/29/14 121 lb (54.885 kg)  08/27/14 123 lb 14.4 oz (56.201 kg)  08/27/14  123 lb 12.8 oz (56.155 kg)  08/23/14 116 lb (52.617 kg)  08/20/14 125 lb 1.6 oz (56.745 kg)  08/09/14 133 lb (60.328 kg)    BMI:  Body mass index is 17.67 kg/(m^2).  Estimated Nutritional Needs:  Kcal:  1600-1800  Protein:  80-90g  Fluid:  1.6L/day    Skin:  Reviewed, no issues  Diet Order:  Diet regular Room service appropriate?: Yes; Fluid consistency:: Thin  EDUCATION NEEDS:  No education needs identified at this time   Intake/Output Summary (Last 24 hours) at 09/22/14 1425 Last data filed at 09/22/14 1300  Gross per 24 hour  Intake 1583.75 ml  Output   4100 ml  Net -2516.25 ml    Last BM:  6/24  Clayton Bibles, MS, RD, LDN Pager: [phone] After Hours Pager: [phone]

## 2014-09-22 NOTE — Plan of Care (Signed)
Problem: Phase II Progression Outcomes Goal: Review culture results with MD if needed Outcome: Not Met (add Reason) Results pending. No growth to date. Day 1

## 2014-09-22 NOTE — Progress Notes (Signed)
[facility] PROGRESS NOTE  [name] ACZ:[id number] DOB: 08/27/1945 DOA: 09/20/2014 PCP: [name], MD  Assessment/Plan: Principal Problem:   Fever - Consulted ID who place patient on cefazolin. - Follow-up with cultures. Urine culture reports no growth. Blood cultures negative to date  Active Problems:   Bronchogenic cancer of left lung - Patient to follow-up with oncologist after discharge    Obstructive jaundice - Patient is status post biliary stent placement    Malnutrition of moderate degree - Continue multivitamin  Anemia - Patient has history of malnutrition and poor oral intake patient has received IV fluid hydration. But has had significant decrease in hemoglobin levels. I suspect there is some dilutional effect given IV fluid hydration. Will reassess hemoglobin levels next a.m. and stool guaiac  Code Status: Full code Family Communication: No family at bedside Disposition Plan: Pending improvement in condition   Consultants:  None  Procedures:  None  Antibiotics:  Cefazolin  HPI/Subjective: The patient has no new complaints currently. He was inquiring about discharge  Objective: Filed Vitals:   09/22/14 0448  BP: 146/81  Pulse: 81  Temp: 98.2 F (36.8 C)  Resp: 17    Intake/Output Summary (Last 24 hours) at 09/22/14 1307 Last data filed at 09/22/14 0900  Gross per 24 hour  Intake 1583.75 ml  Output   3600 ml  Net -2016.25 ml   Filed Weights   09/20/14 2246 09/21/14 0154 09/22/14 0448  Weight: 52.164 kg (115 lb) 54.6 kg (120 lb 5.9 oz) 52.7 kg (116 lb 2.9 oz)    Exam:   General:  Patient in no acute distress, alert and awake  Cardiovascular: Regular rate and rhythm, no murmurs or rubs  Respiratory: no increased wob, no wheezes, equal chest rise  Abdomen: ND, no guarding, soft  Musculoskeletal: no cyanosis or clubbing   Data Reviewed: Basic Metabolic Panel:  Recent Labs Lab 09/20/14 2205 09/21/14 0406  NA  139 135  K 4.9 3.8  CL 107 103  CO2 25 26  GLUCOSE 150* 110*  BUN 12 12  CREATININE 1.28* 1.18  CALCIUM 8.8* 8.2*   Liver Function Tests:  Recent Labs Lab 09/20/14 2205  AST 91*  ALT 88*  ALKPHOS 636*  BILITOT 4.8*  PROT 6.7  ALBUMIN 2.7*    Recent Labs Lab 09/20/14 2353  LIPASE 17*   No results for input(s): AMMONIA in the last 168 hours. CBC:  Recent Labs Lab 09/20/14 2205 09/21/14 0406  WBC 8.0 6.5  NEUTROABS 6.3  --   HGB 10.1* 7.5*  HCT 31.3* 23.3*  MCV 98.7 98.3  PLT 224 183   Cardiac Enzymes: No results for input(s): CKTOTAL, CKMB, CKMBINDEX, TROPONINI in the last 168 hours. BNP (last 3 results) No results for input(s): BNP in the last 8760 hours.  ProBNP (last 3 results) No results for input(s): PROBNP in the last 8760 hours.  CBG: No results for input(s): GLUCAP in the last 168 hours.  Recent Results (from the past 240 hour(s))  Culture, blood (routine x 2)     Status: None (Preliminary result)   Collection Time: 09/20/14 10:05 PM  Result Value Ref Range Status   Specimen Description BLOOD RIGHT ANTECUBITAL  Final   Special Requests BOTTLES DRAWN AEROBIC AND ANAEROBIC 5ML  Final   Culture   Final    NO GROWTH 1 DAY Performed at [facility]    Report Status PENDING  Incomplete  Culture, blood (routine x 2)     Status: None (  Preliminary result)   Collection Time: 09/20/14 10:40 PM  Result Value Ref Range Status   Specimen Description BLOOD RHAND  Final   Special Requests BOTTLES DRAWN AEROBIC AND ANAEROBIC 5CC  Final   Culture   Final    NO GROWTH 1 DAY Performed at [facility]    Report Status PENDING  Incomplete  Urine culture     Status: None   Collection Time: 09/21/14 12:35 AM  Result Value Ref Range Status   Specimen Description URINE, CLEAN CATCH  Final   Special Requests NONE  Final   Culture   Final    NO GROWTH 1 DAY Performed at [facility]    Report Status 09/22/2014 FINAL  Final      Studies: Dg Chest 2 View  09/20/2014   CLINICAL DATA:  Left-sided drain infection. Right-sided abdominal pain and fever. Initial encounter.  EXAM: CHEST  2 VIEW  COMPARISON:  Chest radiograph performed 08/25/2014  FINDINGS: The lungs are well-aerated. A new small right pleural effusion is noted, with associated airspace opacity. The left lung appears clear. No pneumothorax is identified.  The heart remains normal in size. A right-sided chest port is noted ending about the distal SVC. No acute osseous abnormalities are identified.  IMPRESSION: New small right pleural effusion, with associated airspace opacity.   Electronically Signed   By: Garald Balding M.D.   On: 09/20/2014 22:13   Ct Abdomen Pelvis W Contrast  09/21/2014   CLINICAL DATA:  Acute onset of right-sided abdominal pain. Fever. Initial encounter.  EXAM: CT ABDOMEN AND PELVIS WITH CONTRAST  TECHNIQUE: Multidetector CT imaging of the abdomen and pelvis was performed using the standard protocol following bolus administration of intravenous contrast.  CONTRAST:  161m OMNIPAQUE IOHEXOL 300 MG/ML  SOLN  COMPARISON:  CT of the abdomen and pelvis from 09/09/2014  FINDINGS: A small right-sided pleural effusion is noted, with associated atelectasis. Diffuse coronary artery calcifications are seen. Trace pericardial fluid remains within normal limits.  There is mild stable underlying prominence of the intrahepatic biliary ducts. A biliary stent appears patent, and is filled with air. Mild left-sided pneumobilia is again noted. An underlying large mass is again seen at the porta hepatis, measuring approximately 6.0 x 4.5 x 4.6 cm, compatible with metastatic nodal disease as previously described.  Trace pericholecystic fluid is nonspecific given the adjacent metastasis, and appears stable from the prior study. The gallbladder is otherwise unremarkable. The pancreas and adrenal glands are within normal limits.  Additional periaortic and paracaval nodes,  measuring up to 1.5 cm in short axis, are also concerning for metastatic disease, and appear grossly stable from the prior study.  A 1.8 cm cyst is noted at the interpole region of the left kidney, with mild scarring at the upper pole of the right kidney. Mild nonspecific perinephric stranding is noted bilaterally. There is no evidence of hydronephrosis. No renal or ureteral stones are seen.  No free fluid is identified. The small bowel is unremarkable in appearance. The stomach is within normal limits. No acute vascular abnormalities are seen. Diffuse calcification is seen along the abdominal aorta, with mild associated mural thrombus.  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  The appendix is not definitely characterized; there is no evidence of appendicitis. The colon is unremarkable in appearance.  The bladder is mildly distended and grossly unremarkable in appearance. The prostate is normal in size, with  scattered calcification. No inguinal lymphadenopathy is seen.  No acute osseous abnormalities are identified.  IMPRESSION: 1. No new abnormality seen to explain the patient's symptoms. 2. Relatively stable appearance to marked lymphadenopathy at the porta hepatis, and persistent retroperitoneal metastatic lymphadenopathy. 3. Common bile duct stent is stable in appearance, with mild stable dilatation of intrahepatic biliary ducts and left-sided pneumobilia. 4. Persistent small right pleural effusion and associated atelectasis, slightly increased from the prior study. 5. Diffuse coronary artery calcifications seen. 6. Trace pericholecystic fluid is stable from the prior study, and is nonspecific given the adjacent metastasis at the porta hepatis. 7. 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 Recommend interval follow-up was discussed on the prior study. 8.  Diffuse calcification along the abdominal aorta and its branches, with mild associated mural thrombus.   Electronically Signed   By: Garald Balding M.D.   On: 09/21/2014 01:15    Scheduled Meds: . atenolol  50 mg Oral q morning - 10a  .  ceFAZolin (ANCEF) IV  1 g Intravenous 3 times per day  . enoxaparin (LOVENOX) injection  40 mg Subcutaneous Q24H  . mirabegron ER  50 mg Oral QPM  . morphine  15 mg Oral Q12H  . multivitamin with minerals  1 tablet Oral Daily  . multivitamin-lutein  1 capsule Oral Daily  . pantoprazole  40 mg Oral Daily  . vitamin C  1,000 mg Oral Daily   Continuous Infusions: . sodium chloride 75 mL/hr at 09/22/14 0904    Time spent: > 35 minutes    Velvet Bathe  [facility] Pager [phone]. If 7PM-7AM, please contact night-coverage at [email or web address], password [facility] 09/22/2014, 1:07 PM  LOS: 1 day

## 2014-09-23 ENCOUNTER — Telehealth: Payer: Self-pay | Admitting: Medical Oncology

## 2014-09-23 ENCOUNTER — Other Ambulatory Visit: Payer: Self-pay

## 2014-09-23 ENCOUNTER — Ambulatory Visit: Payer: Medicare Other | Admitting: Neurology

## 2014-09-23 ENCOUNTER — Encounter: Payer: Medicare Other | Admitting: Nutrition

## 2014-09-23 ENCOUNTER — Other Ambulatory Visit: Payer: Medicare Other

## 2014-09-23 ENCOUNTER — Ambulatory Visit: Payer: Medicare Other

## 2014-09-23 ENCOUNTER — Ambulatory Visit: Payer: Medicare Other | Admitting: Oncology

## 2014-09-23 DIAGNOSIS — K5669 Other intestinal obstruction: Secondary | ICD-10-CM

## 2014-09-23 DIAGNOSIS — C3411 Malignant neoplasm of upper lobe, right bronchus or lung: Secondary | ICD-10-CM

## 2014-09-23 DIAGNOSIS — R17 Unspecified jaundice: Secondary | ICD-10-CM

## 2014-09-23 LAB — TYPE AND SCREEN
ABO/RH(D): O POS
Antibody Screen: NEGATIVE
Unit division: 0

## 2014-09-23 LAB — CBC
HEMATOCRIT: 25.3 % — AB (ref 39.0–52.0)
Hemoglobin: 8.3 g/dL — ABNORMAL LOW (ref 13.0–17.0)
MCH: 30.9 pg (ref 26.0–34.0)
MCHC: 32.8 g/dL (ref 30.0–36.0)
MCV: 94.1 fL (ref 78.0–100.0)
Platelets: 150 10*3/uL (ref 150–400)
RBC: 2.69 MIL/uL — ABNORMAL LOW (ref 4.22–5.81)
RDW: 17.1 % — AB (ref 11.5–15.5)
WBC: 6.1 10*3/uL (ref 4.0–10.5)

## 2014-09-23 LAB — BASIC METABOLIC PANEL
ANION GAP: 6 (ref 5–15)
BUN: 11 mg/dL (ref 6–20)
CO2: 24 mmol/L (ref 22–32)
Calcium: 7.7 mg/dL — ABNORMAL LOW (ref 8.9–10.3)
Chloride: 110 mmol/L (ref 101–111)
Creatinine, Ser: 0.9 mg/dL (ref 0.61–1.24)
GFR calc non Af Amer: >60 mL/min (ref >60)
Glucose, Bld: 159 mg/dL — ABNORMAL HIGH (ref 65–99)
Potassium: 3.1 mmol/L — ABNORMAL LOW (ref 3.5–5.1)
Sodium: 140 mmol/L (ref 135–145)

## 2014-09-23 LAB — OCCULT BLOOD X 1 CARD TO LAB, STOOL: Fecal Occult Bld: NEGATIVE

## 2014-09-23 MED ORDER — FERROUS SULFATE 325 (65 FE) MG PO TABS
325.0000 mg | ORAL_TABLET | Freq: Three times a day (TID) | ORAL | Status: DC
  Administered 2014-09-23 – 2014-09-24 (×3): 325 mg via ORAL
  Filled 2014-09-23 (×6): qty 1

## 2014-09-23 NOTE — Progress Notes (Signed)
[facility]  Patient Status: Active (receiving services up to time of hospitalization)  [facility] is providing the following services: RN  If patient discharges after hours, please call [phone].   [name] 09/23/2014, 12:25 PM

## 2014-09-23 NOTE — Telephone Encounter (Signed)
Cancelled.  

## 2014-09-23 NOTE — Care Management (Signed)
IM LETTER GIVEN TO RN Doristine Counter

## 2014-09-23 NOTE — Progress Notes (Signed)
Subjective: The patient is seen and examined today. He is feeling a little bit better today. He is very pleasant 69 years old white male with history of metastatic non-small cell lung cancer, adenocarcinoma with recent disease progression and large mass in the abdomen causing obstruction of the biliary system with significant jaundice. He was admitted with fever and chills as well as right upper quadrant abdominal pain with nausea and was found to have infection of the stent. He was started on outpatient basis on Cipro but after admission his and hepatic hemorrhage was changed to Ancef. History better and his serum bilirubin is lower than 2 weeks ago. The patient is expected to be discharged from the hospital in the next few days. He denied having any current nausea or vomiting. He has no fever or chills.  Objective: Vital signs in last 24 hours: Temp:  [98.3 F (36.8 C)-99.5 F (37.5 C)] 98.3 F (36.8 C) (06/27 1229) Pulse Rate:  [74-77] 74 (06/27 1229) Resp:  [16] 16 (06/27 1229) BP: (141-161)/(76-82) 161/82 mmHg (06/27 1229) SpO2:  [96 %-98 %] 98 % (06/27 1229) Weight:  [115 lb 4.8 oz (52.3 kg)] 115 lb 4.8 oz (52.3 kg) (06/27 0529)  Intake/Output from previous day: 06/26 0701 - 06/27 0700 In: 2085 [I.V.:1720; Blood:365] Out: 2875 [Urine:2875] Intake/Output this shift:    General appearance: alert, cooperative, fatigued, icteric and no distress Resp: clear to auscultation bilaterally Cardio: regular rate and rhythm, S1, S2 normal, no murmur, click, rub or gallop GI: soft, non-tender; bowel sounds normal; no masses,  no organomegaly Extremities: extremities normal, atraumatic, no cyanosis or edema  Lab Results:   Recent Labs  09/22/14 2257 09/23/14 0637  WBC 7.3 6.1  HGB 8.9* 8.3*  HCT 27.1* 25.3*  PLT 175 150   BMET  Recent Labs  09/21/14 0406 09/23/14 0950  NA 135 140  K 3.8 3.1*  CL 103 110  CO2 26 24  GLUCOSE 110* 159*  BUN 12 11  CREATININE 1.18 0.90  CALCIUM  8.2* 7.7*    Studies/Results: No results found.  Medications: I have reviewed the patient's current medications.   Assessment/Plan: This is a very pleasant 69 years old white male with metastatic non-small cell lung cancer, adenocarcinoma and significant recent progression. The patient is considered for treatment with immunotherapy but unfortunately his serum bilirubin was significantly elevated. It is much better now but still in the range of 4.0. He was admitted for treatment of biliary stent infection and the patient is feeling a little bit better. I will arrange for the patient a follow-up appointment with me in the clinic in the next few days for reevaluation and consideration of starting his treatment with immunotherapy with Nivolumab. The patient agreed to the current plan. Thank you so much for taking good care of Mr. Juncaj, I will continue to follow up the patient with you and assist in his management on as-needed basis.  LOS: 2 days    Makyiah Lie K. 09/23/2014

## 2014-09-23 NOTE — Progress Notes (Signed)
[facility] PROGRESS NOTE  [name] CNO:[id number] DOB: [birth year] DOA: 09/20/2014 PCP: [name], MD  Assessment/Plan: Principal Problem:   Fever - Consulted ID who place patient on cefazolin. - Follow-up with cultures. Urine culture reports no growth. Blood cultures negative to date  Active Problems:   Bronchogenic cancer of left lung - Patient to follow-up with oncologist after discharge    Obstructive jaundice - Patient is status post biliary stent placement    Malnutrition of moderate degree - Continue multivitamin  Anemia - At this point most likely related to myelosuppression secondary to infectious etiology as well as iron deficiency and intravascular volume expansion given recent fluid administration.   - stool guaiac - Iron replacement - Reassess CBC next a.m. and steady we'll plan on discharging  Code Status: Full code Family Communication: Discussed with patient and family members at bedside Disposition Plan: Pending improvement in condition   Consultants:  None  Procedures:  None  Antibiotics:  Cefazolin  HPI/Subjective: The patient has no new complaints currently. Discussed disposition recommendations in lieu of decreased hemoglobin  Objective: Filed Vitals:   09/23/14 1229  BP: 161/82  Pulse: 74  Temp: 98.3 F (36.8 C)  Resp: 16    Intake/Output Summary (Last 24 hours) at 09/23/14 1417 Last data filed at 09/23/14 1100  Gross per 24 hour  Intake   2085 ml  Output   1975 ml  Net    110 ml   Filed Weights   09/21/14 0154 09/22/14 0448 09/23/14 0529  Weight: 54.6 kg (120 lb 5.9 oz) 52.7 kg (116 lb 2.9 oz) 52.3 kg (115 lb 4.8 oz)    Exam:   General:  Patient in no acute distress, alert and awake  Cardiovascular: Regular rate and rhythm, no murmurs or rubs  Respiratory: no increased wob, no wheezes, equal chest rise  Abdomen: ND, no guarding, soft  Musculoskeletal: no cyanosis or clubbing   Data  Reviewed: Basic Metabolic Panel:  Recent Labs Lab 09/20/14 2205 09/21/14 0406 09/23/14 0950  NA 139 135 140  K 4.9 3.8 3.1*  CL 107 103 110  CO2 '25 26 24  '$ GLUCOSE 150* 110* 159*  BUN '12 12 11  '$ CREATININE 1.28* 1.18 0.90  CALCIUM 8.8* 8.2* 7.7*   Liver Function Tests:  Recent Labs Lab 09/20/14 2205  AST 91*  ALT 88*  ALKPHOS 636*  BILITOT 4.8*  PROT 6.7  ALBUMIN 2.7*    Recent Labs Lab 09/20/14 2353  LIPASE 17*   No results for input(s): AMMONIA in the last 168 hours. CBC:  Recent Labs Lab 09/20/14 2205 09/21/14 0406 09/22/14 1523 09/22/14 2257 09/23/14 0637  WBC 8.0 6.5 5.8 7.3 6.1  NEUTROABS 6.3  --   --   --   --   HGB 10.1* 7.5* 7.3* 8.9* 8.3*  HCT 31.3* 23.3* 22.5* 27.1* 25.3*  MCV 98.7 98.3 96.6 95.4 94.1  PLT 224 183 139* 175 150   Cardiac Enzymes: No results for input(s): CKTOTAL, CKMB, CKMBINDEX, TROPONINI in the last 168 hours. BNP (last 3 results) No results for input(s): BNP in the last 8760 hours.  ProBNP (last 3 results) No results for input(s): PROBNP in the last 8760 hours.  CBG: No results for input(s): GLUCAP in the last 168 hours.  Recent Results (from the past 240 hour(s))  Culture, blood (routine x 2)     Status: None (Preliminary result)   Collection Time: 09/20/14 10:05 PM  Result Value Ref Range Status   Specimen Description  BLOOD RIGHT ANTECUBITAL  Final   Special Requests BOTTLES DRAWN AEROBIC AND ANAEROBIC 5ML  Final   Culture   Final    NO GROWTH 1 DAY Performed at [facility]    Report Status PENDING  Incomplete  Culture, blood (routine x 2)     Status: None (Preliminary result)   Collection Time: 09/20/14 10:40 PM  Result Value Ref Range Status   Specimen Description BLOOD RHAND  Final   Special Requests BOTTLES DRAWN AEROBIC AND ANAEROBIC 5CC  Final   Culture   Final    NO GROWTH 1 DAY Performed at [facility]    Report Status PENDING  Incomplete  Urine culture     Status: None    Collection Time: 09/21/14 12:35 AM  Result Value Ref Range Status   Specimen Description URINE, CLEAN CATCH  Final   Special Requests NONE  Final   Culture   Final    NO GROWTH 1 DAY Performed at [facility]    Report Status 09/22/2014 FINAL  Final     Studies: No results found.  Scheduled Meds: . atenolol  50 mg Oral q morning - 10a  .  ceFAZolin (ANCEF) IV  1 g Intravenous 3 times per day  . enoxaparin (LOVENOX) injection  40 mg Subcutaneous Q24H  . ferrous sulfate  325 mg Oral TID WC  . mirabegron ER  50 mg Oral QPM  . morphine  15 mg Oral Q12H  . multivitamin with minerals  1 tablet Oral Daily  . multivitamin-lutein  1 capsule Oral Daily  . pantoprazole  40 mg Oral Daily  . vitamin C  1,000 mg Oral Daily   Continuous Infusions: . sodium chloride 75 mL/hr at 09/22/14 0904    Time spent: > 35 minutes    Velvet Bathe  [facility] Pager [phone]. If 7PM-7AM, please contact night-coverage at [email or web address], password [facility] 09/23/2014, 2:17 PM  LOS: 2 days

## 2014-09-24 ENCOUNTER — Other Ambulatory Visit: Payer: Self-pay | Admitting: Medical Oncology

## 2014-09-24 ENCOUNTER — Telehealth: Payer: Self-pay | Admitting: Internal Medicine

## 2014-09-24 ENCOUNTER — Telehealth: Payer: Self-pay | Admitting: *Deleted

## 2014-09-24 DIAGNOSIS — C3492 Malignant neoplasm of unspecified part of left bronchus or lung: Secondary | ICD-10-CM

## 2014-09-24 DIAGNOSIS — C772 Secondary and unspecified malignant neoplasm of intra-abdominal lymph nodes: Secondary | ICD-10-CM

## 2014-09-24 LAB — CBC
HCT: 25.9 % — ABNORMAL LOW (ref 39.0–52.0)
HEMOGLOBIN: 8.4 g/dL — AB (ref 13.0–17.0)
MCH: 30.5 pg (ref 26.0–34.0)
MCHC: 32.4 g/dL (ref 30.0–36.0)
MCV: 94.2 fL (ref 78.0–100.0)
PLATELETS: 148 10*3/uL — AB (ref 150–400)
RBC: 2.75 MIL/uL — AB (ref 4.22–5.81)
RDW: 16.9 % — ABNORMAL HIGH (ref 11.5–15.5)
WBC: 6.4 10*3/uL (ref 4.0–10.5)

## 2014-09-24 LAB — BASIC METABOLIC PANEL
ANION GAP: 6 (ref 5–15)
BUN: 10 mg/dL (ref 6–20)
CO2: 25 mmol/L (ref 22–32)
Calcium: 7.9 mg/dL — ABNORMAL LOW (ref 8.9–10.3)
Chloride: 107 mmol/L (ref 101–111)
Creatinine, Ser: 0.9 mg/dL (ref 0.61–1.24)
Glucose, Bld: 100 mg/dL — ABNORMAL HIGH (ref 65–99)
POTASSIUM: 3.1 mmol/L — AB (ref 3.5–5.1)
SODIUM: 138 mmol/L (ref 135–145)

## 2014-09-24 MED ORDER — POTASSIUM CHLORIDE CRYS ER 20 MEQ PO TBCR: 40.0000 meq | EXTENDED_RELEASE_TABLET | Freq: Once | ORAL | Status: DC

## 2014-09-24 MED ORDER — CEPHALEXIN 500 MG PO CAPS: 500.0000 mg | ORAL_CAPSULE | Freq: Two times a day (BID) | ORAL | Status: DC

## 2014-09-24 MED ORDER — HEPARIN SOD (PORK) LOCK FLUSH 100 UNIT/ML IV SOLN
500.0000 [IU] | INTRAVENOUS | Status: AC | PRN
  Administered 2014-09-24: 500 [IU]
  Filled 2014-09-24: qty 5

## 2014-09-24 MED ORDER — FERROUS SULFATE 325 (65 FE) MG PO TABS: 325.0000 mg | ORAL_TABLET | Freq: Three times a day (TID) | ORAL | Status: DC

## 2014-09-24 NOTE — Progress Notes (Signed)
ANTIBIOTIC CONSULT NOTE - Follow up  Pharmacy Consult for Cefazolin Indication: biliary infection  Allergies  Allergen Reactions  . Augmentin [Amoxicillin-Pot Clavulanate] Diarrhea  . Carboplatin     Rash and itching after carbo test dose  . Levaquin [Levofloxacin] Other (See Comments)    Knee pain  . Percocet [Oxycodone-Acetaminophen] Nausea And Vomiting  . Zolpidem Tartrate Diarrhea    Patient Measurements: Height: '5\' 8"'$  (172.7 cm) Weight: 115 lb 15.4 oz (52.6 kg) IBW/kg (Calculated) : 68.4  Vital Signs: Temp: 98.4 F (36.9 C) (06/28 0521) Temp Source: Oral (06/28 0521) BP: 140/84 mmHg (06/28 0521) Pulse Rate: 73 (06/28 0521) Intake/Output from previous day: 06/27 0701 - 06/28 0700 In: 2871.3 [P.O.:120; I.V.:2701.3; IV Piggyback:50] Out: 2100 [Urine:2100]  Labs:  Recent Labs  09/22/14 2257 09/23/14 0637 09/23/14 0950 09/24/14 0530  WBC 7.3 6.1  --  6.4  HGB 8.9* 8.3*  --  8.4*  PLT 175 150  --  148*  CREATININE  --   --  0.90 0.90   Estimated Creatinine Clearance: 57.6 mL/min (by C-G formula based on Cr of 0.9). No results for input(s): VANCOTROUGH, VANCOPEAK, VANCORANDOM, GENTTROUGH, GENTPEAK, GENTRANDOM, TOBRATROUGH, TOBRAPEAK, TOBRARND, AMIKACINPEAK, AMIKACINTROU, AMIKACIN in the last 72 hours.    Assessment: 89 yoM admitted 6/24 with recent biliary stent infection, treated with Cipro.  Pharmacy was initially consulted to dose Vancomycin & Zosyn for SIRS, rule out PNA.  PMH also includes metastatic Lung Ca on palliative XRT, Opdivo.  Per ID, change to cefazolin.  6/24 >> vanc >> 6/25 6/24 >> zosyn >> 6/25 6/25 >> Cefazolin >>  Today, 09/24/2014:  Afebrile  WBC wnl  SCr stable with CrCl ~ 57 ml/min.    Blood and urine cultures unrevealing to date.  Goal of Therapy:  Appropriate abx dosing, eradication of infection.   Plan:   Continue cefazolin 1gm IV q8h  Follow up renal fxn, culture results, and clinical course.   Gretta Arab PharmD,  BCPS Pager [phone] 09/24/2014 10:29 AM

## 2014-09-24 NOTE — Telephone Encounter (Signed)
I can see him this Friday Morning around 10:00 AM

## 2014-09-24 NOTE — Discharge Summary (Signed)
Physician Discharge Summary  [name] WLN:[id number] DOB: [birth year] DOA: 09/20/2014  PCP: [name], MD  Admit date: 09/20/2014 Discharge date: 09/24/2014  Time spent: > 35 minutes  Recommendations for Outpatient Follow-up:  1. Please reassess hemoglobin levels 2. Patient will be discharged on Keflex 3. Reassess potassium levels  Discharge Diagnoses:  Principal Problem:   SIRS (systemic inflammatory response syndrome) Active Problems:   Bronchogenic cancer of left lung   Obstructive jaundice   Malnutrition of moderate degree   Pneumonia   Non-small cell lung cancer   Abdominal pain   Fever   Discharge Condition: stable  Diet recommendation: regular diet  Filed Weights   09/22/14 0448 09/23/14 0529 09/24/14 0521  Weight: 52.7 kg (116 lb 2.9 oz) 52.3 kg (115 lb 4.8 oz) 52.6 kg (115 lb 15.4 oz)    History of present illness:  He is very pleasant 69 years old white male with history of metastatic non-small cell lung cancer, adenocarcinoma with recent disease progression and large mass in the abdomen causing obstruction of the biliary system with significant jaundice. He was admitted with fever and chills as well as right upper quadrant abdominal pain with nausea and was found to have infection of the stent.  Hospital Course:  Biliary stent infection  - We'll place on Keflex patient will complete a 6 day treatment course - Much improved on cefazolin sodium  Iron deficiency anemia -We'll discharge on iron supplementation  Procedures:  None  Consultations:  Oncology  Discharge Exam: Filed Vitals:   09/24/14 0521  BP: 140/84  Pulse: 73  Temp: 98.4 F (36.9 C)  Resp: 16    General: Patient in no acute distress, alert and awake Cardiovascular: Regular rate and rhythm, no murmurs or rubs Respiratory: Clear to auscultation bilaterally, no wheezes  Discharge Instructions   Discharge Instructions    Call MD for:  severe uncontrolled pain     Complete by:  As directed      Call MD for:  temperature >100.4    Complete by:  As directed      Diet - low sodium heart healthy    Complete by:  As directed      Discharge instructions    Complete by:  As directed   Follow up with your primary care physician     Increase activity slowly    Complete by:  As directed           Current Discharge Medication List    START taking these medications   Details  cephALEXin (KEFLEX) 500 MG capsule Take 1 capsule (500 mg total) by mouth 2 (two) times daily. Qty: 4 capsule, Refills: 0    ferrous sulfate 325 (65 FE) MG tablet Take 1 tablet (325 mg total) by mouth 3 (three) times daily with meals. Qty: 90 tablet, Refills: 0      CONTINUE these medications which have NOT CHANGED   Details  Ascorbic Acid (VITAMIN C) 1000 MG tablet Take 1,000 mg by mouth daily.    atenolol (TENORMIN) 100 MG tablet Take 100 mg by mouth every morning.     ciprofloxacin (CIPRO) 500 MG tablet Take 1 tablet (500 mg total) by mouth 2 (two) times daily. Qty: 14 tablet, Refills: 0    HYDROmorphone (DILAUDID) 4 MG tablet Take 1 tablet (4 mg total) by mouth every 6 (six) hours as needed for moderate pain or severe pain. Qty: 30 tablet, Refills: 0   Associated Diagnoses: Squamous carcinoma of lung, unspecified laterality  morphine (MS CONTIN) 15 MG 12 hr tablet Take 1 tablet (15 mg total) by mouth every 12 (twelve) hours. Qty: 60 tablet, Refills: 0    Multiple Vitamin (MULTIVITAMIN) tablet Take 1 tablet by mouth daily.     MYRBETRIQ 50 MG TB24 tablet Take 50 mg by mouth every evening.    Associated Diagnoses: Bronchogenic cancer of left lung    ondansetron (ZOFRAN) 8 MG tablet Take 1 tablet (8 mg total) by mouth every 8 (eight) hours as needed for nausea or vomiting. Qty: 30 tablet, Refills: 2   Associated Diagnoses: Squamous carcinoma of lung, unspecified laterality    Sennosides-Docusate Sodium (SENOKOT S PO) Take 2 tablets by mouth daily as needed  (constipation).     omeprazole (PRILOSEC) 40 MG capsule Take 1 capsule (40 mg total) by mouth every evening. Qty: 90 capsule, Refills: 3   Associated Diagnoses: Malignant neoplasm of upper lobe, bronchus or lung; Malignant neoplasm of upper lobe, bronchus or lung    prochlorperazine (COMPAZINE) 10 MG tablet Take 1 tablet (10 mg total) by mouth every 6 (six) hours as needed. Qty: 30 tablet, Refills: 1   Associated Diagnoses: Squamous carcinoma of lung, unspecified laterality      STOP taking these medications     FeFum-FePoly-FA-B Cmp-C-Biot (FOLIVANE-PLUS) CAPS        Allergies  Allergen Reactions  . Augmentin [Amoxicillin-Pot Clavulanate] Diarrhea  . Carboplatin     Rash and itching after carbo test dose  . Levaquin [Levofloxacin] Other (See Comments)    Knee pain  . Percocet [Oxycodone-Acetaminophen] Nausea And Vomiting  . Zolpidem Tartrate Diarrhea      The results of significant diagnostics from this hospitalization (including imaging, microbiology, ancillary and laboratory) are listed below for reference.    Significant Diagnostic Studies: Dg Chest 2 View  09/20/2014   CLINICAL DATA:  Left-sided drain infection. Right-sided abdominal pain and fever. Initial encounter.  EXAM: CHEST  2 VIEW  COMPARISON:  Chest radiograph performed 08/25/2014  FINDINGS: The lungs are well-aerated. A new small right pleural effusion is noted, with associated airspace opacity. The left lung appears clear. No pneumothorax is identified.  The heart remains normal in size. A right-sided chest port is noted ending about the distal SVC. No acute osseous abnormalities are identified.  IMPRESSION: New small right pleural effusion, with associated airspace opacity.   Electronically Signed   By: Garald Balding M.D.   On: 09/20/2014 22:13   Ct Abdomen W Contrast  09/09/2014   CLINICAL DATA:  Generalized abdominal pain and obstructive jaundice due to cancer. Metastatic left lung non-small-cell carcinoma.  Recently completed chemotherapy.  EXAM: CT ABDOMEN WITH CONTRAST  TECHNIQUE: Multidetector CT imaging of the abdomen was performed using the standard protocol following bolus administration of intravenous contrast.  CONTRAST:  105m OMNIPAQUE IOHEXOL 300 MG/ML  SOLN  COMPARISON:  Noncontrast CT on 08/06/2014  FINDINGS: Lower chest: New small right pleural effusion and right basilar atelectasis is seen since prior study.  Hepatobiliary: Internal biliary stent is now seen within the common bile duct. Mild intrahepatic biliary dilatation and pneumobilia noted. No definite liver masses are identified.  Pancreas:  No definite mass arising within the pancreas.  Spleen:  Within normal limits in size and appearance.  Adrenal Glands:  No mass identified.  Kidneys: No masses identified. No evidence of hydronephrosis. Small benign-appearing left renal cysts again noted.  Stomach/Bowel/Peritoneum: Visualized portions within the abdomen are unremarkable.  Vascular/Lymphatic: Soft tissue mass is again seen in the porta hepatis  and portacaval space which abuts if not encases the internal common bile duct stent. This mass measures 5.5 x 5.4 cm on image 28/series 2 which is mildly increased in size from 5.1 x 5.1 cm on prior study. Other mild areas of retroperitoneal lymphadenopathy within the aorta caval and left paraaortic spaces shows no significant change, with largest lymph node measuring 1.6 cm on image 39/series 2.  3.1 cm infrarenal abdominal aortic aneurysm shows no significant change.  Other:  None.  Musculoskeletal:  No suspicious bone lesions identified.  IMPRESSION: Mild dilatation of intrahepatic bile ducts, with internal common bile duct stent now seen in place.  Mild increase in size of lymphadenopathy in the porta hepatis and portacaval space, consistent with metastatic disease. Other mild abdominal retroperitoneal metastatic lymphadenopathy shows no significant change.  New small right pleural effusion and right  basilar atelectasis.  Stable 3.1 cm infrarenal abdominal aortic aneurysm. Recommend followup by ultrasound in 3 years. This recommendation follows ACR consensus guidelines: White Paper of the ACR Incidental Findings Committee II on Vascular Findings. Natasha Mead Coll Radiol 2013; 10:789-794   Electronically Signed   By: Earle Gell M.D.   On: 09/09/2014 16:32   Ct Abdomen Pelvis W Contrast  09/21/2014   CLINICAL DATA:  Acute onset of right-sided abdominal pain. Fever. Initial encounter.  EXAM: CT ABDOMEN AND PELVIS WITH CONTRAST  TECHNIQUE: Multidetector CT imaging of the abdomen and pelvis was performed using the standard protocol following bolus administration of intravenous contrast.  CONTRAST:  121m OMNIPAQUE IOHEXOL 300 MG/ML  SOLN  COMPARISON:  CT of the abdomen and pelvis from 09/09/2014  FINDINGS: A small right-sided pleural effusion is noted, with associated atelectasis. Diffuse coronary artery calcifications are seen. Trace pericardial fluid remains within normal limits.  There is mild stable underlying prominence of the intrahepatic biliary ducts. A biliary stent appears patent, and is filled with air. Mild left-sided pneumobilia is again noted. An underlying large mass is again seen at the porta hepatis, measuring approximately 6.0 x 4.5 x 4.6 cm, compatible with metastatic nodal disease as previously described.  Trace pericholecystic fluid is nonspecific given the adjacent metastasis, and appears stable from the prior study. The gallbladder is otherwise unremarkable. The pancreas and adrenal glands are within normal limits.  Additional periaortic and paracaval nodes, measuring up to 1.5 cm in short axis, are also concerning for metastatic disease, and appear grossly stable from the prior study.  A 1.8 cm cyst is noted at the interpole region of the left kidney, with mild scarring at the upper pole of the right kidney. Mild nonspecific perinephric stranding is noted bilaterally. There is no evidence of  hydronephrosis. No renal or ureteral stones are seen.  No free fluid is identified. The small bowel is unremarkable in appearance. The stomach is within normal limits. No acute vascular abnormalities are seen. Diffuse calcification is seen along the abdominal aorta, with mild associated mural thrombus.  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  The appendix is not definitely characterized; there is no evidence of appendicitis. The colon is unremarkable in appearance.  The bladder is mildly distended and grossly unremarkable in appearance. The prostate is normal in size, with scattered calcification. No inguinal lymphadenopathy is seen.  No acute osseous abnormalities are identified.  IMPRESSION: 1. No new abnormality seen to explain the patient's symptoms. 2. Relatively stable appearance to marked lymphadenopathy at the porta hepatis, and persistent retroperitoneal metastatic  lymphadenopathy. 3. Common bile duct stent is stable in appearance, with mild stable dilatation of intrahepatic biliary ducts and left-sided pneumobilia. 4. Persistent small right pleural effusion and associated atelectasis, slightly increased from the prior study. 5. Diffuse coronary artery calcifications seen. 6. Trace pericholecystic fluid is stable from the prior study, and is nonspecific given the adjacent metastasis at the porta hepatis. 7. 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 Recommend interval follow-up was discussed on the prior study. 8. Diffuse calcification along the abdominal aorta and its branches, with mild associated mural thrombus.   Electronically Signed   By: Garald Balding M.D.   On: 09/21/2014 01:15   Ir Biliary Stent(s) Existing Access Inc Dilation Cath Exchange  09/05/2014   INDICATION: Common duct obstruction secondary arm metastatic adenopathy, status post  internal external biliary drain catheter placement and radiation therapy.  EXAM: CHOLANGIOGRAM THROUGH EXISTING CATHETER  FLUOROSCOPIC GUIDED PERCUTANEOUS INTERNAL BILIARY STENT PLACEMENT  COMPARISON:  08/12/2014  MEDICATIONS: LIDOCAINE 1% SUBCUTANEOUS  CONTRAST:  66m OMNIPAQUE IOHEXOL 300 MG/ML  SOLN  ANESTHESIA/SEDATION: Intravenous Fentanyl and Versed were administered as conscious sedation during continuous cardiorespiratory monitoring by the radiology RN, with a total moderate sedation time of 18 minutes.  FLUOROSCOPY TIME:  3 MINUTES 36 SECONDS, 43 MGY  COMPLICATIONS: None immediate  TECHNIQUE: Informed written [email or web address] of the risks, benefits and alternatives to treatment. Questions regarding the procedure were encouraged and answered. A timeout was performed prior to the initiation of the procedure.  Contrast was injected through the previously placed internal external biliary drainage catheter.  Catheter and surrounding skin were then prepped and draped in usual sterile fashion. Maximum barrier sterile technique with sterile gowns and gloves were used for the procedure. A timeout was performed prior to the initiation of the procedure.  The catheter was cut and exchanged over a Bentson wire for a 9 [place] sheath for additional cholangiographic images. A 5 [place] the catheter was advanced to the ligament Treitz. The length of the residual CBD obstruction was measured. An Amplatz wire was placed. Over this, a 10 mm x 60 mm wall flex stent was deployed across the CBD obstruction. Contrast injection was performed. The sheath was removed over a guidewire, and once it was clear that hemostasis had been achieved, the guidewire removed. Site was dressed with a sterile dressing.  The patient tolerated the procedure well without immediate postprocedural complication.  FINDINGS: Injection of the internal external drain demonstrated decompression of the  biliary tree and good flow into the duodenum. Additional cholangiographic images after removal of the stent showed Residual long tapered narrowing of the mid and distal CBD, incompletely occlusive. Because of his persistently elevated biliRubin and long residual narrowing of the CBD, it was thought prudent to go ahead open this segment up completely, so the 10 x 60 wall flex stent was deployed. Post deployment imaging shows excellent flow through the length of the CBD without residual narrowing or obstruction. The intrahepatic biliary tree remains decompressed. Cystic duct is patent. Percutaneous access was therefore removed.  IMPRESSION: 1. Persistent narrowing in the mid and distal CBD. 2. 10 x 60 Wallflex stent placement across distal CBD obstruction, and removal of percutaneous access.   Electronically Signed   By: DLucrezia EuropeM.D.   On: 09/05/2014 16:14    Microbiology: Recent Results (from the past 240 hour(s))  Culture, blood (routine x 2)  Status: None (Preliminary result)   Collection Time: 09/20/14 10:05 PM  Result Value Ref Range Status   Specimen Description BLOOD RIGHT ANTECUBITAL  Final   Special Requests BOTTLES DRAWN AEROBIC AND ANAEROBIC 5ML  Final   Culture   Final    NO GROWTH 2 DAYS Performed at [facility]    Report Status PENDING  Incomplete  Culture, blood (routine x 2)     Status: None (Preliminary result)   Collection Time: 09/20/14 10:40 PM  Result Value Ref Range Status   Specimen Description BLOOD RHAND  Final   Special Requests BOTTLES DRAWN AEROBIC AND ANAEROBIC 5CC  Final   Culture   Final    NO GROWTH 2 DAYS Performed at [facility]    Report Status PENDING  Incomplete  Urine culture     Status: None   Collection Time: 09/21/14 12:35 AM  Result Value Ref Range Status   Specimen Description URINE, CLEAN CATCH  Final   Special Requests NONE  Final   Culture   Final    NO GROWTH 1 DAY Performed at [facility]    Report Status  09/22/2014 FINAL  Final     Labs: Basic Metabolic Panel:  Recent Labs Lab 09/20/14 2205 09/21/14 0406 09/23/14 0950 09/24/14 0530  NA 139 135 140 138  K 4.9 3.8 3.1* 3.1*  CL 107 103 110 107  CO2 '25 26 24 25  '$ GLUCOSE 150* 110* 159* 100*  BUN '12 12 11 10  '$ CREATININE 1.28* 1.18 0.90 0.90  CALCIUM 8.8* 8.2* 7.7* 7.9*   Liver Function Tests:  Recent Labs Lab 09/20/14 2205  AST 91*  ALT 88*  ALKPHOS 636*  BILITOT 4.8*  PROT 6.7  ALBUMIN 2.7*    Recent Labs Lab 09/20/14 2353  LIPASE 17*   No results for input(s): AMMONIA in the last 168 hours. CBC:  Recent Labs Lab 09/20/14 2205 09/21/14 0406 09/22/14 1523 09/22/14 2257 09/23/14 0637 09/24/14 0530  WBC 8.0 6.5 5.8 7.3 6.1 6.4  NEUTROABS 6.3  --   --   --   --   --   HGB 10.1* 7.5* 7.3* 8.9* 8.3* 8.4*  HCT 31.3* 23.3* 22.5* 27.1* 25.3* 25.9*  MCV 98.7 98.3 96.6 95.4 94.1 94.2  PLT 224 183 139* 175 150 148*   Cardiac Enzymes: No results for input(s): CKTOTAL, CKMB, CKMBINDEX, TROPONINI in the last 168 hours. BNP: BNP (last 3 results) No results for input(s): BNP in the last 8760 hours.  ProBNP (last 3 results) No results for input(s): PROBNP in the last 8760 hours.  CBG: No results for input(s): GLUCAP in the last 168 hours.     Signed:  Velvet Bathe  [facility] 09/24/2014, 10:21 AM

## 2014-09-24 NOTE — Telephone Encounter (Signed)
PT. SAW DR.MOHAMED IN THE HOSPITAL LAST NIGHT. PT. STATES THAT DR.MOHAMED TOLD PT. TO CALL THIS OFFICE AND SPEAK TO DR.MOHAMED'S NURSE, DIANE, WHEN PT. WAS DISCHARGED TODAY TO SCHEDULE A FOLLOW UP APPOINTMENT.

## 2014-09-24 NOTE — Telephone Encounter (Signed)
S/w pt confirming labs/ov per 06/28 POF.... Cherylann Banas

## 2014-09-26 LAB — CULTURE, BLOOD (ROUTINE X 2)
Culture: NO GROWTH
Culture: NO GROWTH

## 2014-09-27 ENCOUNTER — Telehealth: Payer: Self-pay | Admitting: Internal Medicine

## 2014-09-27 ENCOUNTER — Encounter: Payer: Self-pay | Admitting: Internal Medicine

## 2014-09-27 ENCOUNTER — Other Ambulatory Visit (HOSPITAL_BASED_OUTPATIENT_CLINIC_OR_DEPARTMENT_OTHER): Payer: Medicare Other

## 2014-09-27 ENCOUNTER — Ambulatory Visit (HOSPITAL_BASED_OUTPATIENT_CLINIC_OR_DEPARTMENT_OTHER): Payer: Medicare Other | Admitting: Internal Medicine

## 2014-09-27 VITALS — BP 146/80 | HR 74 | Temp 98.1°F | Resp 17 | Ht 68.0 in | Wt 117.3 lb

## 2014-09-27 DIAGNOSIS — K831 Obstruction of bile duct: Secondary | ICD-10-CM

## 2014-09-27 DIAGNOSIS — C801 Malignant (primary) neoplasm, unspecified: Secondary | ICD-10-CM

## 2014-09-27 DIAGNOSIS — Z5112 Encounter for antineoplastic immunotherapy: Secondary | ICD-10-CM

## 2014-09-27 DIAGNOSIS — C772 Secondary and unspecified malignant neoplasm of intra-abdominal lymph nodes: Secondary | ICD-10-CM

## 2014-09-27 DIAGNOSIS — R1084 Generalized abdominal pain: Secondary | ICD-10-CM

## 2014-09-27 DIAGNOSIS — C3492 Malignant neoplasm of unspecified part of left bronchus or lung: Secondary | ICD-10-CM

## 2014-09-27 LAB — COMPREHENSIVE METABOLIC PANEL (CC13)
ALK PHOS: 703 U/L — AB (ref 40–150)
ALT: 77 U/L — AB (ref 0–55)
ANION GAP: 9 meq/L (ref 3–11)
AST: 84 U/L — AB (ref 5–34)
Albumin: 2.2 g/dL — ABNORMAL LOW (ref 3.5–5.0)
BUN: 10.8 mg/dL (ref 7.0–26.0)
CALCIUM: 9 mg/dL (ref 8.4–10.4)
CHLORIDE: 107 meq/L (ref 98–109)
CO2: 25 mEq/L (ref 22–29)
CREATININE: 1 mg/dL (ref 0.7–1.3)
EGFR: 76 mL/min/{1.73_m2} — ABNORMAL LOW (ref >90)
GLUCOSE: 145 mg/dL — AB (ref 70–140)
Potassium: 3.5 mEq/L (ref 3.5–5.1)
Sodium: 141 mEq/L (ref 136–145)
TOTAL PROTEIN: 6.1 g/dL — AB (ref 6.4–8.3)
Total Bilirubin: 3.55 mg/dL (ref 0.20–1.20)

## 2014-09-27 LAB — CBC WITH DIFFERENTIAL/PLATELET
BASO%: 0.3 % (ref 0.0–2.0)
Basophils Absolute: 0 10*3/uL (ref 0.0–0.1)
EOS ABS: 0.1 10*3/uL (ref 0.0–0.5)
EOS%: 1.7 % (ref 0.0–7.0)
HEMATOCRIT: 30.1 % — AB (ref 38.4–49.9)
HGB: 9.8 g/dL — ABNORMAL LOW (ref 13.0–17.1)
LYMPH%: 4.9 % — ABNORMAL LOW (ref 14.0–49.0)
MCH: 31.1 pg (ref 27.2–33.4)
MCHC: 32.6 g/dL (ref 32.0–36.0)
MCV: 95.6 fL (ref 79.3–98.0)
MONO#: 0.7 10*3/uL (ref 0.1–0.9)
MONO%: 11.1 % (ref 0.0–14.0)
NEUT%: 82 % — AB (ref 39.0–75.0)
NEUTROS ABS: 5.2 10*3/uL (ref 1.5–6.5)
Platelets: 171 10*3/uL (ref 140–400)
RBC: 3.15 10*6/uL — ABNORMAL LOW (ref 4.20–5.82)
RDW: 16.6 % — ABNORMAL HIGH (ref 11.0–14.6)
WBC: 6.3 10*3/uL (ref 4.0–10.3)
lymph#: 0.3 10*3/uL — ABNORMAL LOW (ref 0.9–3.3)

## 2014-09-27 MED ORDER — DRONABINOL 2.5 MG PO CAPS: 2.5000 mg | ORAL_CAPSULE | Freq: Two times a day (BID) | ORAL | Status: DC

## 2014-09-27 NOTE — Progress Notes (Signed)
[place] [phone]   Fax:([phone]) [place], MD [address] [place] [address]  DIAGNOSIS AND DIAGNOSIS: Metastatic non-small cell lung cancer adenocarcinoma diagnosed in September 2010   PRIOR THERAPY:  #1 status post 6 cycles of systemic chemotherapy with carboplatin, Alimta and Avastin given every 3 weeks last dose given 04/23/2009 with disease stabilization.  #2 status post palliative radiotherapy to the mediastinum under the care of Dr. Pablo Ledger. The patient received a total dose of 3000 cGY and 12 fractions completed 01/21/2009.  #3 Maintenance chemotherapy with Alimta at 500 mg per meter squared and Avastin at 15 mg per kilogram given every 3 weeks status post 35 cycles.  #4 Maintenance chemotherapy with Alimta 500 mg/M2 and Avastin 15 mg/kg every 4 weeks,status post 17 cycles, discontinued secondary to disease progression.  #5 Systemic chemotherapy with carboplatin for AUC of 5, Alimta 500 mg/M2 and Avastin 15 mg/kg every 3 weeks, status post 3 cycles, last cycle was given 12/28/2012. Carboplatin was discontinued starting cycle #2 secondary to hypersensitivity reaction. #6  Maintenance chemotherapy again with Alimta 500 mg/M2 and Avastin 15 mg/kg every 3 weeks, first cycle 01/23/2013. Status post 6 cycles. #7 palliative radiotherapy to the large abdominal mass under the care of Dr. Pablo Ledger.  CURRENT THERAPY: Immunotherapy with Nivolumab 3 MG/KG every 2 weeks. First dose 10/03/2014  CHEMOTHERAPY INTENT: palliative.  CURRENT # OF CHEMOTHERAPY CYCLES: 1 CURRENT ANTIEMETICS: Compazine when necessary but clearly used.  CURRENT SMOKING STATUS: current nonsmoker  ORAL CHEMOTHERAPY AND CONSENT: None.  CURRENT BISPHOSPHONATES USE: None.  PAIN MANAGEMENT: No Pain  NARCOTICS INDUCED CONSTIPATION: N/A  LIVING WILL AND CODE STATUS: NCB   INTERVAL HISTORY:  [name] 69 y.o. male returns to  the clinic today for follow up visit accompanied by his wife. The patient completed palliative radiotherapy to the large abdominal mass. He was recently admitted to [facility] with fever and chills and was found to have infection of the biliary stent. He was treated an outpatient basis with Cipro before the admission but there was no improvement in his condition. During his hospitalization he was treated with Cefazolin and was discharged home on Keflex. He is feeling fine today. His serum bilirubin had improved but still elevated over the last 2 weeks. He was supposed to start the first cycle of immunotherapy more than 2 weeks ago but this was placed on hold because of the elevated serum bilirubin and the recent infection. The patient continues to complain of lack of appetite and weight loss. He denied having any significant chest pain, shortness of breath, cough or hemoptysis. He still mildly icteric. He has no current fever or chills. No nausea or vomiting. He is here today for evaluation and discussion of his treatment options.  MEDICAL HISTORY: Past Medical History  Diagnosis Date  . Hypertension   . Arthritis RIGHT SHOULDER  . Immature cataract BILATERAL  . Non-small cell lung cancer DX SEPT 2010  W/ CHEMORADIATION AT THAT TIME -- NOW  W/ METS--  CURRENTLY ON MAINTENANCE CHEMO TX  EVERY 30 DAYS    ONCOLOGIST- [facility]  . Acute meniscal tear of knee RIGHT KNEE  . BPH (benign prostatic hypertrophy)   . Dizziness and giddiness 04/23/2014  . Tremor 04/23/2014    Right hand  . HOH (hard of hearing)     bilateral hearing aids  . Radiation 01/08/09-01/21/09    Mediastinum 30 Gy x  12 fractions    ALLERGIES:  is allergic to augmentin; carboplatin; levaquin; percocet; and zolpidem tartrate.  MEDICATIONS:  Current Outpatient Prescriptions  Medication Sig Dispense Refill  . Ascorbic Acid (VITAMIN C) 1000 MG tablet Take 1,000 mg by mouth daily.    Marland Kitchen atenolol (TENORMIN) 100 MG tablet Take  100 mg by mouth every morning. Pt taking '50mg'$  once daily    . cephALEXin (KEFLEX) 500 MG capsule Take 1 capsule (500 mg total) by mouth 2 (two) times daily. 4 capsule 0  . ciprofloxacin (CIPRO) 500 MG tablet Take 1 tablet (500 mg total) by mouth 2 (two) times daily. 14 tablet 0  . ferrous sulfate 325 (65 FE) MG tablet Take 1 tablet (325 mg total) by mouth 3 (three) times daily with meals. 90 tablet 0  . HYDROmorphone (DILAUDID) 4 MG tablet Take 1 tablet (4 mg total) by mouth every 6 (six) hours as needed for moderate pain or severe pain. 30 tablet 0  . morphine (MS CONTIN) 15 MG 12 hr tablet Take 1 tablet (15 mg total) by mouth every 12 (twelve) hours. 60 tablet 0  . Multiple Vitamin (MULTIVITAMIN) tablet Take 1 tablet by mouth daily.     Marland Kitchen MYRBETRIQ 50 MG TB24 tablet Take 50 mg by mouth every evening.     Marland Kitchen omeprazole (PRILOSEC) 40 MG capsule Take 1 capsule (40 mg total) by mouth every evening. (Patient taking differently: Take 40 mg by mouth every morning. ) 90 capsule 3  . ondansetron (ZOFRAN) 8 MG tablet Take 1 tablet (8 mg total) by mouth every 8 (eight) hours as needed for nausea or vomiting. 30 tablet 2  . prochlorperazine (COMPAZINE) 10 MG tablet Take 1 tablet (10 mg total) by mouth every 6 (six) hours as needed. (Patient taking differently: Take 10 mg by mouth every 6 (six) hours as needed for nausea or vomiting. ) 30 tablet 1  . Sennosides-Docusate Sodium (SENOKOT S PO) Take 2 tablets by mouth daily as needed (constipation).      No current facility-administered medications for this visit.    SURGICAL HISTORY:  Past Surgical History  Procedure Laterality Date  . 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 Rotator cuff repair  2008    LEFT SHOULDER  . Bilateral ear drum surgery  1960'S  . Orif left ring finger  and revascularization of radial side  02-16-2007    DEGLOVING INJURY  . Knee arthroscopy  12/21/2011     Procedure: ARTHROSCOPY KNEE;  Surgeon: Tobi Bastos, MD;  Location: [facility];  Service: Orthopedics;  Laterality: Right;  WITH MEDIAL MENISECTOMY  . Cataract extraction Bilateral   . Tonsillectomy      as child  . Ovarian cyst surgery      removed from left jaw area- benign  . Ovarian cyst surgery       NOTE; pt unaware of why this is in his chart  . Shoulder open rotator cuff repair Right 06/05/2014    Procedure: RIGHT ROTATOR CUFF REPAIR SHOULDER OPEN;  Surgeon: Latanya Maudlin, MD;  Location: [facility];  Service: Orthopedics;  Laterality: Right;  . Ercp N/A 08/09/2014    Procedure: ENDOSCOPIC RETROGRADE CHOLANGIOPANCREATOGRAPHY (ERCP);  Surgeon: Carol Ada, MD;  Location: [facility];  Service: Endoscopy;  Laterality: N/A;  . Eus N/A 08/09/2014    Procedure: UPPER ENDOSCOPIC ULTRASOUND (EUS) RADIAL;  Surgeon: Carol Ada, MD;  Location: [facility];  Service: Endoscopy;  Laterality: N/A;  . Fine needle aspiration N/A 08/09/2014    Procedure: FINE NEEDLE ASPIRATION (FNA) LINEAR;  Surgeon: Carol Ada, MD;  Location: [facility];  Service: Endoscopy;  Laterality: N/A;  . Esophagogastroduodenoscopy (egd) with propofol N/A 08/09/2014    Procedure: ESOPHAGOGASTRODUODENOSCOPY (EGD) WITH PROPOFOL;  Surgeon: Carol Ada, MD;  Location: [facility];  Service: Endoscopy;  Laterality: N/A;    REVIEW OF SYSTEMS:  Constitutional: positive for anorexia, fatigue and weight loss Eyes: negative Ears, nose, mouth, throat, and face: negative Respiratory: negative Cardiovascular: negative Gastrointestinal: negative Genitourinary:negative Integument/breast: negative Hematologic/lymphatic: negative Musculoskeletal:negative Neurological: negative Behavioral/Psych: negative Endocrine: negative Allergic/Immunologic: negative   PHYSICAL EXAMINATION: General appearance: alert, cooperative, fatigued, icteric and no distress Head: Normocephalic, without obvious abnormality,  atraumatic Neck: no adenopathy, no JVD, supple, symmetrical, trachea midline and thyroid not enlarged, symmetric, no tenderness/mass/nodules Lymph nodes: Cervical, supraclavicular, and axillary nodes normal. Resp: clear to auscultation bilaterally Back: symmetric, no curvature. ROM normal. No CVA tenderness. Cardio: regular rate and rhythm, S1, S2 normal, no murmur, click, rub or gallop GI: soft, non-tender; bowel sounds normal; no masses,  no organomegaly Extremities: extremities normal, atraumatic, no cyanosis or edema Neurologic: Alert and oriented X 3, normal strength and tone. Normal symmetric reflexes. Normal coordination and gait  ECOG PERFORMANCE STATUS: 1 - Symptomatic but completely ambulatory  Blood pressure 146/80, pulse 74, temperature 98.1 F (36.7 C), temperature source Oral, resp. rate 17, height '5\' 8"'$  (1.727 m), weight 117 lb 4.8 oz (53.207 kg), SpO2 99 %.  LABORATORY DATA: Lab Results  Component Value Date   WBC 6.3 09/27/2014   HGB 9.8* 09/27/2014   HCT 30.1* 09/27/2014   MCV 95.6 09/27/2014   PLT 171 09/27/2014      Chemistry      Component Value Date/Time   NA 138 09/24/2014 0530   NA 142 09/09/2014 1307   K 3.1* 09/24/2014 0530   K 3.7 09/09/2014 1307   CL 107 09/24/2014 0530   CL 109* 08/29/2012 0856   CO2 25 09/24/2014 0530   CO2 24 09/09/2014 1307   BUN 10 09/24/2014 0530   BUN 12.2 09/09/2014 1307   CREATININE 0.90 09/24/2014 0530   CREATININE 1.0 09/09/2014 1307      Component Value Date/Time   CALCIUM 7.9* 09/24/2014 0530   CALCIUM 8.4 09/09/2014 1307   ALKPHOS 636* 09/20/2014 2205   ALKPHOS 434* 09/09/2014 1307   AST 91* 09/20/2014 2205   AST 156* 09/09/2014 1307   ALT 88* 09/20/2014 2205   ALT 154* 09/09/2014 1307   BILITOT 4.8* 09/20/2014 2205   BILITOT 8.93 Repeated and Verified* 09/09/2014 1307       RADIOGRAPHIC STUDIES: Dg Chest 2 View  09/20/2014   CLINICAL DATA:  Left-sided drain infection. Right-sided abdominal pain and  fever. Initial encounter.  EXAM: CHEST  2 VIEW  COMPARISON:  Chest radiograph performed 08/25/2014  FINDINGS: The lungs are well-aerated. A new small right pleural effusion is noted, with associated airspace opacity. The left lung appears clear. No pneumothorax is identified.  The heart remains normal in size. A right-sided chest port is noted ending about the distal SVC. No acute osseous abnormalities are identified.  IMPRESSION: New small right pleural effusion, with associated airspace opacity.   Electronically Signed   By: Garald Balding M.D.   On: 09/20/2014 22:13   Ct Abdomen W Contrast  09/09/2014   CLINICAL DATA:  Generalized abdominal pain and obstructive jaundice due to cancer. Metastatic left lung non-small-cell carcinoma.  Recently completed chemotherapy.  EXAM: CT ABDOMEN WITH CONTRAST  TECHNIQUE: Multidetector CT imaging of the abdomen was performed using the standard protocol following bolus administration of intravenous contrast.  CONTRAST:  1m OMNIPAQUE IOHEXOL 300 MG/ML  SOLN  COMPARISON:  Noncontrast CT on 08/06/2014  FINDINGS: Lower chest: New small right pleural effusion and right basilar atelectasis is seen since prior study.  Hepatobiliary: Internal biliary stent is now seen within the common bile duct. Mild intrahepatic biliary dilatation and pneumobilia noted. No definite liver masses are identified.  Pancreas:  No definite mass arising within the pancreas.  Spleen:  Within normal limits in size and appearance.  Adrenal Glands:  No mass identified.  Kidneys: No masses identified. No evidence of hydronephrosis. Small benign-appearing left renal cysts again noted.  Stomach/Bowel/Peritoneum: Visualized portions within the abdomen are unremarkable.  Vascular/Lymphatic: Soft tissue mass is again seen in the porta hepatis and portacaval space which abuts if not encases the internal common bile duct stent. This mass measures 5.5 x 5.4 cm on image 28/series 2 which is mildly increased in size  from 5.1 x 5.1 cm on prior study. Other mild areas of retroperitoneal lymphadenopathy within the aorta caval and left paraaortic spaces shows no significant change, with largest lymph node measuring 1.6 cm on image 39/series 2.  3.1 cm infrarenal abdominal aortic aneurysm shows no significant change.  Other:  None.  Musculoskeletal:  No suspicious bone lesions identified.  IMPRESSION: Mild dilatation of intrahepatic bile ducts, with internal common bile duct stent now seen in place.  Mild increase in size of lymphadenopathy in the porta hepatis and portacaval space, consistent with metastatic disease. Other mild abdominal retroperitoneal metastatic lymphadenopathy shows no significant change.  New small right pleural effusion and right basilar atelectasis.  Stable 3.1 cm infrarenal abdominal aortic aneurysm. Recommend followup by ultrasound in 3 years. This recommendation follows ACR consensus guidelines: White Paper of the ACR Incidental Findings Committee II on Vascular Findings. JNatasha MeadColl Radiol 2013; 10:789-794   Electronically Signed   By: JEarle GellM.D.   On: 09/09/2014 16:32   Ct Abdomen Pelvis W Contrast  09/21/2014   CLINICAL DATA:  Acute onset of right-sided abdominal pain. Fever. Initial encounter.  EXAM: CT ABDOMEN AND PELVIS WITH CONTRAST  TECHNIQUE: Multidetector CT imaging of the abdomen and pelvis was performed using the standard protocol following bolus administration of intravenous contrast.  CONTRAST:  108mOMNIPAQUE IOHEXOL 300 MG/ML  SOLN  COMPARISON:  CT of the abdomen and pelvis from 09/09/2014  FINDINGS: A small right-sided pleural effusion is noted, with associated atelectasis. Diffuse coronary artery calcifications are seen. Trace pericardial fluid remains within normal limits.  There is mild stable underlying prominence of the intrahepatic biliary ducts. A biliary stent appears patent, and is filled with air. Mild left-sided pneumobilia is again noted. An underlying large mass is  again seen at the porta hepatis, measuring approximately 6.0 x 4.5 x 4.6 cm, compatible with metastatic nodal disease as previously described.  Trace pericholecystic fluid is nonspecific given the adjacent metastasis, and appears stable from the prior study. The gallbladder is otherwise unremarkable. The pancreas and adrenal glands are within normal limits.  Additional periaortic and paracaval nodes, measuring up to 1.5 cm in short axis, are also concerning for metastatic disease, and appear grossly stable from the prior study.  A 1.8 cm cyst is noted at the interpole region of the left kidney, with mild scarring at the upper pole of the right kidney. Mild nonspecific perinephric  stranding is noted bilaterally. There is no evidence of hydronephrosis. No renal or ureteral stones are seen.  No free fluid is identified. The small bowel is unremarkable in appearance. The stomach is within normal limits. No acute vascular abnormalities are seen. Diffuse calcification is seen along the abdominal aorta, with mild associated mural thrombus.  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  The appendix is not definitely characterized; there is no evidence of appendicitis. The colon is unremarkable in appearance.  The bladder is mildly distended and grossly unremarkable in appearance. The prostate is normal in size, with scattered calcification. No inguinal lymphadenopathy is seen.  No acute osseous abnormalities are identified.  IMPRESSION: 1. No new abnormality seen to explain the patient's symptoms. 2. Relatively stable appearance to marked lymphadenopathy at the porta hepatis, and persistent retroperitoneal metastatic lymphadenopathy. 3. Common bile duct stent is stable in appearance, with mild stable dilatation of intrahepatic biliary ducts and left-sided pneumobilia. 4. Persistent small right pleural effusion and associated atelectasis,  slightly increased from the prior study. 5. Diffuse coronary artery calcifications seen. 6. Trace pericholecystic fluid is stable from the prior study, and is nonspecific given the adjacent metastasis at the porta hepatis. 7. 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 Recommend interval follow-up was discussed on the prior study. 8. Diffuse calcification along the abdominal aorta and its branches, with mild associated mural thrombus.   Electronically Signed   By: Garald Balding M.D.   On: 09/21/2014 01:15   Ir Biliary Stent(s) Existing Access Inc Dilation Cath Exchange  09/05/2014   INDICATION: Common duct obstruction secondary arm metastatic adenopathy, status post internal external biliary drain catheter placement and radiation therapy.  EXAM: CHOLANGIOGRAM THROUGH EXISTING CATHETER  FLUOROSCOPIC GUIDED PERCUTANEOUS INTERNAL BILIARY STENT PLACEMENT  COMPARISON:  08/12/2014  MEDICATIONS: LIDOCAINE 1% SUBCUTANEOUS  CONTRAST:  25m OMNIPAQUE IOHEXOL 300 MG/ML  SOLN  ANESTHESIA/SEDATION: Intravenous Fentanyl and Versed were administered as conscious sedation during continuous cardiorespiratory monitoring by the radiology RN, with a total moderate sedation time of 18 minutes.  FLUOROSCOPY TIME:  3 MINUTES 36 SECONDS, 43 MGY  COMPLICATIONS: None immediate  TECHNIQUE: Informed written consent was obtained from [email or web address] a discussion of the risks, benefits and alternatives to treatment. Questions regarding the procedure were encouraged and answered. A timeout was performed prior to the initiation of the procedure.  Contrast was injected through the previously placed internal external biliary drainage catheter.  Catheter and surrounding skin were then prepped and draped in usual sterile fashion. Maximum barrier sterile technique with sterile gowns and gloves were used for the procedure. A timeout was performed prior to the initiation of  the procedure.  The catheter was cut and exchanged over a Bentson wire for a 9 [place] sheath for additional cholangiographic images. A 5 [place] the catheter was advanced to the ligament Treitz. The length of the residual CBD obstruction was measured. An Amplatz wire was placed. Over this, a 10 mm x 60 mm wall flex stent was deployed across the CBD obstruction. Contrast injection was performed. The sheath was removed over a guidewire, and once it was clear that hemostasis had been achieved, the guidewire removed. Site was dressed with a sterile dressing.  The patient tolerated the procedure well without immediate postprocedural complication.  FINDINGS: Injection of the internal external drain demonstrated decompression of the biliary tree and good flow  into the duodenum. Additional cholangiographic images after removal of the stent showed Residual long tapered narrowing of the mid and distal CBD, incompletely occlusive. Because of his persistently elevated biliRubin and long residual narrowing of the CBD, it was thought prudent to go ahead open this segment up completely, so the 10 x 60 wall flex stent was deployed. Post deployment imaging shows excellent flow through the length of the CBD without residual narrowing or obstruction. The intrahepatic biliary tree remains decompressed. Cystic duct is patent. Percutaneous access was therefore removed.  IMPRESSION: 1. Persistent narrowing in the mid and distal CBD. 2. 10 x 60 Wallflex stent placement across distal CBD obstruction, and removal of percutaneous access.   Electronically Signed   By: Lucrezia Europe M.D.   On: 09/05/2014 16:14   ASSESSMENT AND PLAN: This is a very pleasant 69 years old white male with metastatic non-small cell lung cancer, adenocarcinoma underwent several chemotherapy regimens including recent treatment with systemic chemotherapy with Alimta and Avastin status post 6 cycles. The patient tolerated his previous treatment fairly well  except for the increasing fatigue and weakness as well as pancytopenia. He was on observation for 16 months.  Unfortunately the recent imaging studies showed evidence for significant disease progression involving the chest as well as abdominal masses that was biopsy proven to be poorly differentiated carcinoma favoring adenocarcinoma. He completed palliative radiotherapy to the large abdominal mass. I had a lengthy discussion with the patient today about his treatment options. I recommended for him to start the treatment with immunotherapy with Nivolumab 3 MG/KG every 2 weeks. He is expected to start the first dose of this treatment on 10/03/2014. I reminded the patient with the adverse effect of this treatment including but not limited to immune mediated skin rash, diarrhea, liver, renal, thyroid and other endocrine dysfunction. For the lack of appetite, I started the patient on Marinol 2.5 mg by mouth twice a day and encouraged him to increase his oral intake. For pain management the patient will continue on the current pain medication. The patient would come back for follow-up visit in 3 weeks for reevaluation and management of any adverse effect of his treatment. He was advised to call immediately if he has any concerning symptoms in the interval. The patient voices understanding of current disease status and treatment options and is in agreement with the current care plan.  All questions were answered. The patient knows to call the clinic with any problems, questions or concerns. We can certainly see the patient much sooner if necessary.  Disclaimer: This note was dictated with voice recognition software. Similar sounding words can inadvertently be transcribed and may not be corrected upon review.

## 2014-09-27 NOTE — Telephone Encounter (Signed)
Gave and printed appt sched and avs for pt for July  °

## 2014-09-28 DIAGNOSIS — Z5112 Encounter for antineoplastic immunotherapy: Secondary | ICD-10-CM | POA: Insufficient documentation

## 2014-09-30 ENCOUNTER — Other Ambulatory Visit: Payer: Self-pay | Admitting: Oncology

## 2014-09-30 NOTE — Progress Notes (Unsigned)
Called by home care nurse who tells me she is not able to access the patient's port. She can get the needle and, and it feels like attending the back, but he does not flush or draw. She had a second nurse dry with the same results. She tells me the patient is very dehydrated and the reason to get into the port is intravenous fluids.  The patient has nausea, vomiting, and diarrhea. He is on narcotics, which should be helping with the diarrhea, but apparently is not. I do not find a C. difficile. He is not taking his prochlorperazine as prescribed, having taken it only once today. We reviewed that he can take that up to 4 times a day, and that he can also use Imodium for the diarrhea. I asked the nurse to insert a peripheral IV and give the patient the IV fluids that way. The patient is to call our office tomorrow morning for further evaluation. If she cannot get the fluids and however peripherally then he will need to go to the emergency room for hydration and further evaluation.

## 2014-10-01 ENCOUNTER — Other Ambulatory Visit: Payer: Self-pay | Admitting: *Deleted

## 2014-10-01 ENCOUNTER — Ambulatory Visit (HOSPITAL_BASED_OUTPATIENT_CLINIC_OR_DEPARTMENT_OTHER): Payer: Medicare Other

## 2014-10-01 ENCOUNTER — Telehealth: Payer: Self-pay | Admitting: *Deleted

## 2014-10-01 ENCOUNTER — Ambulatory Visit: Payer: Medicare Other

## 2014-10-01 ENCOUNTER — Ambulatory Visit (HOSPITAL_BASED_OUTPATIENT_CLINIC_OR_DEPARTMENT_OTHER): Payer: Medicare Other | Admitting: Nurse Practitioner

## 2014-10-01 VITALS — BP 159/79 | HR 61 | Temp 98.2°F | Resp 16 | Wt 111.8 lb

## 2014-10-01 DIAGNOSIS — R740 Nonspecific elevation of levels of transaminase and lactic acid dehydrogenase [LDH]: Secondary | ICD-10-CM | POA: Diagnosis not present

## 2014-10-01 DIAGNOSIS — E860 Dehydration: Secondary | ICD-10-CM

## 2014-10-01 DIAGNOSIS — R7401 Elevation of levels of liver transaminase levels: Secondary | ICD-10-CM

## 2014-10-01 DIAGNOSIS — Z95828 Presence of other vascular implants and grafts: Secondary | ICD-10-CM

## 2014-10-01 DIAGNOSIS — R634 Abnormal weight loss: Secondary | ICD-10-CM | POA: Diagnosis not present

## 2014-10-01 DIAGNOSIS — R630 Anorexia: Secondary | ICD-10-CM

## 2014-10-01 DIAGNOSIS — C772 Secondary and unspecified malignant neoplasm of intra-abdominal lymph nodes: Secondary | ICD-10-CM

## 2014-10-01 DIAGNOSIS — R112 Nausea with vomiting, unspecified: Secondary | ICD-10-CM | POA: Diagnosis present

## 2014-10-01 DIAGNOSIS — C3492 Malignant neoplasm of unspecified part of left bronchus or lung: Secondary | ICD-10-CM

## 2014-10-01 DIAGNOSIS — T829XXA Unspecified complication of cardiac and vascular prosthetic device, implant and graft, initial encounter: Secondary | ICD-10-CM

## 2014-10-01 DIAGNOSIS — C3490 Malignant neoplasm of unspecified part of unspecified bronchus or lung: Secondary | ICD-10-CM

## 2014-10-01 LAB — CBC WITH DIFFERENTIAL/PLATELET
BASO%: 0.6 % (ref 0.0–2.0)
Basophils Absolute: 0 10*3/uL (ref 0.0–0.1)
EOS%: 2 % (ref 0.0–7.0)
Eosinophils Absolute: 0.1 10*3/uL (ref 0.0–0.5)
HCT: 29.7 % — ABNORMAL LOW (ref 38.4–49.9)
HGB: 10 g/dL — ABNORMAL LOW (ref 13.0–17.1)
LYMPH#: 0.5 10*3/uL — AB (ref 0.9–3.3)
LYMPH%: 7.5 % — ABNORMAL LOW (ref 14.0–49.0)
MCH: 32 pg (ref 27.2–33.4)
MCHC: 33.5 g/dL (ref 32.0–36.0)
MCV: 95.3 fL (ref 79.3–98.0)
MONO#: 1 10*3/uL — AB (ref 0.1–0.9)
MONO%: 14.5 % — ABNORMAL HIGH (ref 0.0–14.0)
NEUT%: 75.4 % — AB (ref 39.0–75.0)
NEUTROS ABS: 5.3 10*3/uL (ref 1.5–6.5)
Platelets: 239 10*3/uL (ref 140–400)
RBC: 3.11 10*6/uL — AB (ref 4.20–5.82)
RDW: 16.8 % — AB (ref 11.0–14.6)
WBC: 7.1 10*3/uL (ref 4.0–10.3)

## 2014-10-01 LAB — COMPREHENSIVE METABOLIC PANEL (CC13)
ALK PHOS: 670 U/L — AB (ref 40–150)
ALT: 62 U/L — ABNORMAL HIGH (ref 0–55)
AST: 60 U/L — AB (ref 5–34)
Albumin: 2.5 g/dL — ABNORMAL LOW (ref 3.5–5.0)
Anion Gap: 10 mEq/L (ref 3–11)
BUN: 11.6 mg/dL (ref 7.0–26.0)
CO2: 20 mEq/L — ABNORMAL LOW (ref 22–29)
CREATININE: 0.9 mg/dL (ref 0.7–1.3)
Calcium: 9.2 mg/dL (ref 8.4–10.4)
Chloride: 107 mEq/L (ref 98–109)
EGFR: 88 mL/min/{1.73_m2} — ABNORMAL LOW (ref >90)
Glucose: 101 mg/dl (ref 70–140)
Potassium: 3.7 mEq/L (ref 3.5–5.1)
Sodium: 137 mEq/L (ref 136–145)
Total Bilirubin: 2.96 mg/dL — ABNORMAL HIGH (ref 0.20–1.20)
Total Protein: 6.4 g/dL (ref 6.4–8.3)

## 2014-10-01 MED ORDER — SODIUM CHLORIDE 0.9 % IJ SOLN
10.0000 mL | INTRAMUSCULAR | Status: DC | PRN
  Filled 2014-10-01: qty 10

## 2014-10-01 MED ORDER — ONDANSETRON HCL 8 MG PO TABS: 8.0000 mg | ORAL_TABLET | Freq: Three times a day (TID) | ORAL | Status: DC | PRN

## 2014-10-01 MED ORDER — LORAZEPAM 0.5 MG PO TABS: 0.5000 mg | ORAL_TABLET | Freq: Four times a day (QID) | ORAL | Status: DC | PRN

## 2014-10-01 NOTE — Progress Notes (Signed)
Patient in today for labs and appointment with Selena Lesser, NP. Patient's PAC area is red and warm to touch. Patient states, "The home health nurse stuck my port about four times yesterday." Patient states that the site is tender. Patient agrees to have all labs drawn by [place], Phlebotomist today. Cyndee Bacon's nurse [place], LPN made aware of Patient's PAC being red and warm to touch. Patient to see Selena Lesser, NP after having labs drawn by [place], Phlebotomist.

## 2014-10-01 NOTE — Telephone Encounter (Signed)
Received call from wife [name] stating home health nurse had problem accessing pt's port.  Spoke with [name], and was informed that 2 different home health nurses had tried to access pt's port yesterday unsuccessfully.  Nurses were not able to obtain blood return from port.  [name] also stated pt had nausea and vomited on Sat and Sunday.  Pt took antiemetics without relief.  [name] stated home health nurse suggested pt to receive TNA  Instead of  Normal saline at home.   Pt vomited x 1 yesterday, and  None  Today.  Went over directions for taking antiemetics with [name].  Instructed [name] to bring pt in today at  1145 am to see flush nurse for port assessment. Informed [name] that after pt has labs drawn from port today, pt will need to see Jenny Reichmann, NP symptom management for further evaluation.  [name] voiced understanding. [name][facility]  Phone   [phone].

## 2014-10-02 ENCOUNTER — Telehealth: Payer: Self-pay

## 2014-10-02 NOTE — Telephone Encounter (Signed)
PA for dronabinol to Raquel

## 2014-10-03 ENCOUNTER — Encounter: Payer: Self-pay | Admitting: Internal Medicine

## 2014-10-03 ENCOUNTER — Telehealth: Payer: Self-pay | Admitting: *Deleted

## 2014-10-03 ENCOUNTER — Encounter: Payer: Self-pay | Admitting: Nurse Practitioner

## 2014-10-03 ENCOUNTER — Telehealth: Payer: Self-pay | Admitting: Nurse Practitioner

## 2014-10-03 DIAGNOSIS — E860 Dehydration: Secondary | ICD-10-CM | POA: Insufficient documentation

## 2014-10-03 DIAGNOSIS — R634 Abnormal weight loss: Secondary | ICD-10-CM | POA: Insufficient documentation

## 2014-10-03 DIAGNOSIS — R7401 Elevation of levels of liver transaminase levels: Secondary | ICD-10-CM | POA: Insufficient documentation

## 2014-10-03 DIAGNOSIS — T829XXA Unspecified complication of cardiac and vascular prosthetic device, implant and graft, initial encounter: Secondary | ICD-10-CM | POA: Insufficient documentation

## 2014-10-03 DIAGNOSIS — R740 Nonspecific elevation of levels of transaminase and lactic acid dehydrogenase [LDH]: Secondary | ICD-10-CM

## 2014-10-03 DIAGNOSIS — R112 Nausea with vomiting, unspecified: Secondary | ICD-10-CM | POA: Insufficient documentation

## 2014-10-03 DIAGNOSIS — R630 Anorexia: Secondary | ICD-10-CM | POA: Insufficient documentation

## 2014-10-03 NOTE — Assessment & Plan Note (Signed)
Patient is complaining of minimal appetite and continued weight loss.  Patient states that he has lost approximately 30 pounds within the last 2 months.  Patient was prescribed Marinol for appetite stimulation; but states that he has not started it yet due to cost.  Advised both patient and his family that they may want to try obtaining only a small portion of the original prescription, Marinol to see if that helps before trying to purchase the entire prescription quantity.  Patient was advised to continue pushing protein in his diet and fluids.

## 2014-10-03 NOTE — Assessment & Plan Note (Signed)
Bilirubin has decreased from 3.55 down to 2.96 today.  We'll continue to monitor closely.

## 2014-10-03 NOTE — Progress Notes (Signed)
Per Caryl Pina at [facility] dronabinol 2.'5mg'$  capsules approved 10/03/14-04/05/15- [place] [id number]

## 2014-10-03 NOTE — Assessment & Plan Note (Signed)
Patient is complaining of chronic nausea and occasional vomiting.  He states that he has tried both Zofran and Compazine with only minimal effectiveness.  He is requesting a refill of the Zofran today.  Patient will receive IV fluid rehydration while at the cancer Center today.  He was also given a prescription of Ativan to try for nausea as well.

## 2014-10-03 NOTE — Assessment & Plan Note (Signed)
Patient continues with minimal appetite and weight loss.  Patient states his last proximal, weight 30 pounds within the last 2 months.  Patient was encouraged to push protein in his diet and eat multiple small meals.  Times a day if at all possible.  He was also encouraged to push fluids.

## 2014-10-03 NOTE — Assessment & Plan Note (Signed)
Pt is scheduled to return Friday 10/04/14 for labs and initial cycle of Nivolumab immunotherapy.   Patient has a right upper chest Port-A-Cath; that has been apparently having some difficulty flushing and drawing back.  Patient has a home health nurse that has been attempting to access the site with no success.  After consulting with Dr. Mohamed-decision was made to hold on trying to re-access the Port-A-Cath today.  Advised patient would once again attempt to re-access the port on Friday, 10/04/2014.  If unable to access the port for immunotherapy; patient will most likely require Cathflo.  Dr. [name] is aware and in agreement with the use of peripheral vein for immunotherapy infusion.

## 2014-10-03 NOTE — Telephone Encounter (Signed)
per pof pt appt are already sch

## 2014-10-03 NOTE — Assessment & Plan Note (Addendum)
Pt is scheduled to return Friday 10/04/14 for labs and initial cycle of Nivolumab immunotherapy.   Patient has a right upper chest Port-A-Cath; that has been apparently having some difficulty flushing and drawing back.  Patient has a home health nurse that has been attempting to access the site with no success.  After consulting with Dr. Mohamed-decision was made to hold on trying to re-access the Port-A-Cath today.  Advised patient would once again attempt to re-access the port on Friday, 10/04/2014.  If unable to access the port for immunotherapy; patient will most likely require Cathflo.  Dr. [name] is aware and in agreement with the use of peripheral vein for immunotherapy infusion.  Pt was also concerned with mild erythema directly over the port site. On closer exam- port site has multiple pink/red spider veins surrounding hub site; but no actual erythema, no edema, no warmth, and no red streaks.  There is also no tenderness to the site.

## 2014-10-03 NOTE — Progress Notes (Signed)
SYMPTOM MANAGEMENT CLINIC   HPI: [name] 69 y.o. male diagnosed with lung cancer.  Patient is scheduled to initiate Nivolumab immunotherapy this coming Friday, 10/04/2014.  Patient called the cancer Center today complaining of continued nausea and occasional vomiting.  He continues with minimal appetite and continued weight loss to approximately 30 pounds within the past 2 months.  He feels dehydrated today; despite receiving IV fluid rehydration per his home health nurse just yesterday.  He denies any diarrhea.  He denies any recent fevers or chills.   Patient also reports that his home health nurse tried on 4 different occasions to access his right upper chest Port-A-Cath site yesterday; but was unable to either drawback or flash.  He is also noted some erythema directly across the upper/Port-A-Cath site.  He denies any tenderness to the site.  Also, patient states that he was prescribed Marinol for appetite stimulation; but it was too costly. HPI  ROS  Past Medical History  Diagnosis Date  . Hypertension   . Arthritis RIGHT SHOULDER  . Immature cataract BILATERAL  . Non-small cell lung cancer DX SEPT 2010  W/ CHEMORADIATION AT THAT TIME -- NOW  W/ METS--  CURRENTLY ON MAINTENANCE CHEMO TX  EVERY 30 DAYS    ONCOLOGIST- [facility]  . Acute meniscal tear of knee RIGHT KNEE  . BPH (benign prostatic hypertrophy)   . Dizziness and giddiness 04/23/2014  . Tremor 04/23/2014    Right hand  . HOH (hard of hearing)     bilateral hearing aids  . Radiation 01/08/09-01/21/09    Mediastinum 30 Gy x 12 fractions    Past Surgical History  Procedure Laterality Date  . 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 Rotator cuff repair  2008    LEFT SHOULDER  . Bilateral ear drum surgery  1960'S  . Orif left ring finger  and revascularization of radial side  02-16-2007    DEGLOVING INJURY  . Knee arthroscopy  12/21/2011      Procedure: ARTHROSCOPY KNEE;  Surgeon: Tobi Bastos, MD;  Location: [facility];  Service: Orthopedics;  Laterality: Right;  WITH MEDIAL MENISECTOMY  . Cataract extraction Bilateral   . Tonsillectomy      as child  . Ovarian cyst surgery      removed from left jaw area- benign  . Ovarian cyst surgery       NOTE; pt unaware of why this is in his chart  . Shoulder open rotator cuff repair Right 06/05/2014    Procedure: RIGHT ROTATOR CUFF REPAIR SHOULDER OPEN;  Surgeon: Latanya Maudlin, MD;  Location: [facility];  Service: Orthopedics;  Laterality: Right;  . Ercp N/A 08/09/2014    Procedure: ENDOSCOPIC RETROGRADE CHOLANGIOPANCREATOGRAPHY (ERCP);  Surgeon: Carol Ada, MD;  Location: [facility];  Service: Endoscopy;  Laterality: N/A;  . Eus N/A 08/09/2014    Procedure: UPPER ENDOSCOPIC ULTRASOUND (EUS) RADIAL;  Surgeon: Carol Ada, MD;  Location: [facility];  Service: Endoscopy;  Laterality: N/A;  . Fine needle aspiration N/A 08/09/2014    Procedure: FINE NEEDLE ASPIRATION (FNA) LINEAR;  Surgeon: Carol Ada, MD;  Location: [facility];  Service: Endoscopy;  Laterality: N/A;  . Esophagogastroduodenoscopy (egd) with propofol N/A 08/09/2014    Procedure: ESOPHAGOGASTRODUODENOSCOPY (EGD) WITH PROPOFOL;  Surgeon: Carol Ada, MD;  Location: [facility];  Service: Endoscopy;  Laterality: N/A;    has Bronchogenic cancer of left lung; ARTHRITIS; Dyspnea;  Pulmonary infiltrate; Osteoarthritis of right knee; Meniscus, medial, posterior horn derangement; Hypertension; Acute on chronic renal failure; Pancytopenia; Dizziness and giddiness; Tremor; Rotator cuff tear, non-traumatic; Obstructive jaundice due to cancer; Abdominal pain, generalized; CRF (chronic renal failure); Obstructive jaundice; Malnutrition of moderate degree; Abdominal mass; Abdominal pain, acute; Fatigue; Biliary obstruction; Cancer of intra-abdominal lymph nodes, secondary; Pneumonia; SIRS (systemic inflammatory response  syndrome); Non-small cell lung cancer; Abdominal pain; Fever; Encounter for antineoplastic immunotherapy; Nausea with vomiting; Dehydration; Central line complication; Anorexia; Weight loss; Hyperbilirubinemia; Transaminitis; and Hyperphosphatemia on his problem list.    is allergic to augmentin; carboplatin; levaquin; percocet; and zolpidem tartrate.    Medication List       This list is accurate as of: 10/01/14 11:59 PM.  Always use your most recent med list.               atenolol 100 MG tablet  Commonly known as:  TENORMIN  Take 100 mg by mouth every morning. Pt taking 61m once daily     dronabinol 2.5 MG capsule  Commonly known as:  MARINOL  Take 1 capsule (2.5 mg total) by mouth 2 (two) times daily before a meal.     ferrous sulfate 325 (65 FE) MG tablet  Take 1 tablet (325 mg total) by mouth 3 (three) times daily with meals.     HYDROmorphone 4 MG tablet  Commonly known as:  DILAUDID  Take 1 tablet (4 mg total) by mouth every 6 (six) hours as needed for moderate pain or severe pain.     LORazepam 0.5 MG tablet  Commonly known as:  ATIVAN  Take 1 tablet (0.5 mg total) by mouth every 6 (six) hours as needed (nausea).     morphine 15 MG 12 hr tablet  Commonly known as:  MS CONTIN  Take 1 tablet (15 mg total) by mouth every 12 (twelve) hours.     multivitamin tablet  Take 1 tablet by mouth daily.     MYRBETRIQ 50 MG Tb24 tablet  Generic drug:  mirabegron ER  Take 50 mg by mouth every evening.     omeprazole 40 MG capsule  Commonly known as:  PRILOSEC  Take 1 capsule (40 mg total) by mouth every evening.     ondansetron 8 MG tablet  Commonly known as:  ZOFRAN  Take 1 tablet (8 mg total) by mouth every 8 (eight) hours as needed for nausea or vomiting.     prochlorperazine 10 MG tablet  Commonly known as:  COMPAZINE  Take 1 tablet (10 mg total) by mouth every 6 (six) hours as needed.     SENOKOT S PO  Take 2 tablets by mouth daily as needed (constipation).       vitamin C 1000 MG tablet  Take 1,000 mg by mouth daily.         PHYSICAL EXAMINATION  Oncology Vitals 10/01/2014 09/27/2014 09/24/2014 09/23/2014 09/23/2014 09/23/2014 09/23/2014  Height - 173 cm - - - - -  Weight 50.712 kg 53.207 kg 52.6 kg - - - 52.3 kg  Weight (lbs) 111 lbs 13 oz 117 lbs 5 oz 115 lbs 15 oz - - - 115 lbs 5 oz  BMI (kg/m2) - 17.84 kg/m2 17.63 kg/m2 - - - 17.53 kg/m2  Temp 98.2 98.1 98.4 98.5 - 98.3 98.7  Pulse 61 74 73 71 80 74 75  Resp 16 17 16 16  - 16 16  Resp (Historical as of 10/28/11) - - - - - - -  SpO2 100 99 95 98 - 98 96  BSA (m2) - 1.6 m2 1.59 m2 - - - 1.58 m2   BP Readings from Last 3 Encounters:  10/01/14 159/79  09/27/14 146/80  09/24/14 140/84    Physical Exam  Constitutional: He is oriented to person, place, and time.  Patient appears fatigued, frail, and chronically ill.  HENT:  Head: Normocephalic and atraumatic.  Mouth/Throat: Oropharynx is clear and moist.  Eyes: Conjunctivae and EOM are normal. Pupils are equal, round, and reactive to light. Right eye exhibits no discharge. Left eye exhibits no discharge. No scleral icterus.  Neck: Normal range of motion.  Pulmonary/Chest: Effort normal. No respiratory distress.  Musculoskeletal: Normal range of motion. He exhibits no edema or tenderness.  Neurological: He is alert and oriented to person, place, and time.  Skin: Skin is warm and dry. No rash noted. No erythema. There is pallor.  Patient has pink/red spider veins directly across the right upper chest Port-A-Cath hub site.  There is no actual erythema, warmth, edema, tenderness, or red streaks.  Psychiatric: Affect normal.  Nursing note and vitals reviewed.   LABORATORY DATA:. Appointment on 10/01/2014  Component Date Value Ref Range Status  . WBC 10/01/2014 7.1  4.0 - 10.3 10e3/uL Final  . NEUT# 10/01/2014 5.3  1.5 - 6.5 10e3/uL Final  . HGB 10/01/2014 10.0* 13.0 - 17.1 g/dL Final  . HCT 10/01/2014 29.7* 38.4 - 49.9 % Final  . Platelets  10/01/2014 239  140 - 400 10e3/uL Final  . MCV 10/01/2014 95.3  79.3 - 98.0 fL Final  . MCH 10/01/2014 32.0  27.2 - 33.4 pg Final  . MCHC 10/01/2014 33.5  32.0 - 36.0 g/dL Final  . RBC 10/01/2014 3.11* 4.20 - 5.82 10e6/uL Final  . RDW 10/01/2014 16.8* 11.0 - 14.6 % Final  . lymph# 10/01/2014 0.5* 0.9 - 3.3 10e3/uL Final  . MONO# 10/01/2014 1.0* 0.1 - 0.9 10e3/uL Final  . Eosinophils Absolute 10/01/2014 0.1  0.0 - 0.5 10e3/uL Final  . Basophils Absolute 10/01/2014 0.0  0.0 - 0.1 10e3/uL Final  . NEUT% 10/01/2014 75.4* 39.0 - 75.0 % Final  . LYMPH% 10/01/2014 7.5* 14.0 - 49.0 % Final  . MONO% 10/01/2014 14.5* 0.0 - 14.0 % Final  . EOS% 10/01/2014 2.0  0.0 - 7.0 % Final  . BASO% 10/01/2014 0.6  0.0 - 2.0 % Final  . Sodium 10/01/2014 137  136 - 145 mEq/L Final  . Potassium 10/01/2014 3.7  3.5 - 5.1 mEq/L Final  . Chloride 10/01/2014 107  98 - 109 mEq/L Final  . CO2 10/01/2014 20* 22 - 29 mEq/L Final  . Glucose 10/01/2014 101  70 - 140 mg/dl Final  . BUN 10/01/2014 11.6  7.0 - 26.0 mg/dL Final  . Creatinine 10/01/2014 0.9  0.7 - 1.3 mg/dL Final  . Total Bilirubin 10/01/2014 2.96* 0.20 - 1.20 mg/dL Final  . Alkaline Phosphatase 10/01/2014 670* 40 - 150 U/L Final  . AST 10/01/2014 60* 5 - 34 U/L Final  . ALT 10/01/2014 62* 0 - 55 U/L Final  . Total Protein 10/01/2014 6.4  6.4 - 8.3 g/dL Final  . Albumin 10/01/2014 2.5* 3.5 - 5.0 g/dL Final  . Calcium 10/01/2014 9.2  8.4 - 10.4 mg/dL Final  . Anion Gap 10/01/2014 10  3 - 11 mEq/L Final  . EGFR 10/01/2014 88* >90 ml/min/1.73 m2 Final   eGFR is calculated using the CKD-EPI Creatinine Equation (2009)     RADIOGRAPHIC STUDIES: No results  found.  ASSESSMENT/PLAN:    Anorexia Patient is complaining of minimal appetite and continued weight loss.  Patient states that he has lost approximately 30 pounds within the last 2 months.  Patient was prescribed Marinol for appetite stimulation; but states that he has not started it yet due to  cost.  Advised both patient and his family that they may want to try obtaining only a small portion of the original prescription, Marinol to see if that helps before trying to purchase the entire prescription quantity.  Patient was advised to continue pushing protein in his diet and fluids.  Bronchogenic cancer of left lung Pt is scheduled to return Friday 10/04/14 for labs and initial cycle of Nivolumab immunotherapy.   Patient has a right upper chest Port-A-Cath; that has been apparently having some difficulty flushing and drawing back.  Patient has a home health nurse that has been attempting to access the site with no success.  After consulting with Dr. Mohamed-decision was made to hold on trying to re-access the Port-A-Cath today.  Advised patient would once again attempt to re-access the port on Friday, 10/04/2014.  If unable to access the port for immunotherapy; patient will most likely require Cathflo.  Dr. [name] is aware and in agreement with the use of peripheral vein for immunotherapy infusion.  Central line complication Pt is scheduled to return Friday 10/04/14 for labs and initial cycle of Nivolumab immunotherapy.   Patient has a right upper chest Port-A-Cath; that has been apparently having some difficulty flushing and drawing back.  Patient has a home health nurse that has been attempting to access the site with no success.  After consulting with Dr. Mohamed-decision was made to hold on trying to re-access the Port-A-Cath today.  Advised patient would once again attempt to re-access the port on Friday, 10/04/2014.  If unable to access the port for immunotherapy; patient will most likely require Cathflo.  Dr. [name] is aware and in agreement with the use of peripheral vein for immunotherapy infusion.  Pt was also concerned with mild erythema directly over the port site. On closer exam- port site has multiple pink/red spider veins surrounding hub site; but no actual erythema, no edema, no  warmth, and no red streaks.  There is also no tenderness to the site.   Dehydration Patient is complaining of chronic nausea and occasional vomiting.  He states that he has tried both Zofran and Compazine with only minimal effectiveness.  He is requesting a refill of the Zofran today.  Patient will receive IV fluid rehydration while at the cancer Center today.  He was also given a prescription of Ativan to try for nausea as well.  Hyperbilirubinemia Bilirubin has decreased from 3.55 down to 2.96 today.  We'll continue to monitor closely.  Hyperphosphatemia Alkaline phosphatase has improved from 703 down to 670.  Will continue to monitor closely.  Nausea with vomiting Patient is complaining of chronic nausea and occasional vomiting.  He states that he has tried both Zofran and Compazine with only minimal effectiveness.  He is requesting a refill of the Zofran today.  Patient will receive IV fluid rehydration while at the cancer Center today.  He was also given a prescription of Ativan to try for nausea as well.  Transaminitis All liver enzymes have improved as well.  AST down to 60 and ALT is down to 62.  Will continue to monitor.  Weight loss Patient continues with minimal appetite and weight loss.  Patient states his last proximal, weight  30 pounds within the last 2 months.  Patient was encouraged to push protein in his diet and eat multiple small meals.  Times a day if at all possible.  He was also encouraged to push fluids.  Patient stated understanding of all instructions; and was in agreement with this plan of care. The patient knows to call the clinic with any problems, questions or concerns.   This was a shared visit with Dr. [name].    Total time spent with patient was 25 minutes;  with greater than 75 percent of that time spent in face to face counseling regarding patient's symptoms,  and coordination of care and follow up.  Disclaimer: This note was dictated with voice  recognition software. Similar sounding words can inadvertently be transcribed and may not be corrected upon review.   Drue Second, NP 10/03/2014   ADDENDUM: Hematology/Oncology Attending: I had a face to face encounter with the patient. I recommended his care plan. This is a very pleasant 69 years old white male with metastatic non-small cell lung cancer, adenocarcinoma who has recent evidence for disease progression and the patient is expected to start the first cycle of treatment with immunotherapy with Nivolumab later this week. As been complaining of increasing fatigue and weakness as well as a cough appetite and weight loss. He was given prescription for Marinol 2.5 mg by mouth twice a day but unfortunately his copayment with a high and the patient could not fill his prescription. He is currently receiving [facility]. His liver enzymes are also elevated but continues to improve. There was an issue with the IV access and we will try to re-access it before his first treatment later this week. He would come back for follow-up visit as previously scheduled. The patient was advised to call immediately if he has any concerning symptoms in the interval.  Disclaimer: This note was dictated with voice recognition software. Similar sounding words can inadvertently be transcribed and may not be corrected upon review. [name]., MD 10/05/2014

## 2014-10-03 NOTE — Assessment & Plan Note (Signed)
Alkaline phosphatase has improved from 703 down to 670.  Will continue to monitor closely.

## 2014-10-03 NOTE — Telephone Encounter (Signed)
          Patient has a right upper chest Port-A-Cath; that has been apparently having some difficulty flushing and drawing back. Patient has a home health nurse that has been attempting to access the site with no success.  Advised patient would once again attempt to re-access the port on Friday, 10/04/2014. If unable to access the port for immunotherapy; patient will most likely require Cathflo. Dr. [name] is aware and in agreement with the use of peripheral vein for immunotherapy infusion.

## 2014-10-03 NOTE — Assessment & Plan Note (Signed)
All liver enzymes have improved as well.  AST down to 60 and ALT is down to 62.  Will continue to monitor.

## 2014-10-04 ENCOUNTER — Ambulatory Visit (HOSPITAL_BASED_OUTPATIENT_CLINIC_OR_DEPARTMENT_OTHER): Payer: Medicare Other

## 2014-10-04 ENCOUNTER — Other Ambulatory Visit (HOSPITAL_BASED_OUTPATIENT_CLINIC_OR_DEPARTMENT_OTHER): Payer: Medicare Other

## 2014-10-04 VITALS — BP 144/96 | HR 68 | Temp 97.2°F | Resp 18

## 2014-10-04 DIAGNOSIS — Z79899 Other long term (current) drug therapy: Secondary | ICD-10-CM

## 2014-10-04 DIAGNOSIS — R5382 Chronic fatigue, unspecified: Secondary | ICD-10-CM

## 2014-10-04 DIAGNOSIS — C3492 Malignant neoplasm of unspecified part of left bronchus or lung: Secondary | ICD-10-CM

## 2014-10-04 DIAGNOSIS — Z5112 Encounter for antineoplastic immunotherapy: Secondary | ICD-10-CM | POA: Diagnosis present

## 2014-10-04 DIAGNOSIS — Z452 Encounter for adjustment and management of vascular access device: Secondary | ICD-10-CM

## 2014-10-04 LAB — CBC WITH DIFFERENTIAL/PLATELET
BASO%: 0.7 % (ref 0.0–2.0)
BASOS ABS: 0 10*3/uL (ref 0.0–0.1)
EOS%: 2.5 % (ref 0.0–7.0)
Eosinophils Absolute: 0.1 10*3/uL (ref 0.0–0.5)
HEMATOCRIT: 33 % — AB (ref 38.4–49.9)
HEMOGLOBIN: 10.8 g/dL — AB (ref 13.0–17.1)
LYMPH%: 8.6 % — AB (ref 14.0–49.0)
MCH: 31.3 pg (ref 27.2–33.4)
MCHC: 32.7 g/dL (ref 32.0–36.0)
MCV: 95.7 fL (ref 79.3–98.0)
MONO#: 1.1 10*3/uL — ABNORMAL HIGH (ref 0.1–0.9)
MONO%: 18.9 % — ABNORMAL HIGH (ref 0.0–14.0)
NEUT%: 69.3 % (ref 39.0–75.0)
NEUTROS ABS: 3.8 10*3/uL (ref 1.5–6.5)
Platelets: 198 10*3/uL (ref 140–400)
RBC: 3.45 10*6/uL — ABNORMAL LOW (ref 4.20–5.82)
RDW: 16.4 % — ABNORMAL HIGH (ref 11.0–14.6)
WBC: 5.6 10*3/uL (ref 4.0–10.3)
lymph#: 0.5 10*3/uL — ABNORMAL LOW (ref 0.9–3.3)

## 2014-10-04 LAB — COMPREHENSIVE METABOLIC PANEL (CC13)
ALT: 58 U/L — AB (ref 0–55)
ANION GAP: 9 meq/L (ref 3–11)
AST: 50 U/L — ABNORMAL HIGH (ref 5–34)
Albumin: 2.7 g/dL — ABNORMAL LOW (ref 3.5–5.0)
Alkaline Phosphatase: 653 U/L — ABNORMAL HIGH (ref 40–150)
BUN: 11.2 mg/dL (ref 7.0–26.0)
CO2: 21 meq/L — AB (ref 22–29)
Calcium: 9.5 mg/dL (ref 8.4–10.4)
Chloride: 107 mEq/L (ref 98–109)
Creatinine: 1.1 mg/dL (ref 0.7–1.3)
EGFR: 72 mL/min/{1.73_m2} — ABNORMAL LOW (ref >90)
GLUCOSE: 162 mg/dL — AB (ref 70–140)
Potassium: 3.5 mEq/L (ref 3.5–5.1)
SODIUM: 138 meq/L (ref 136–145)
Total Bilirubin: 2.59 mg/dL — ABNORMAL HIGH (ref 0.20–1.20)
Total Protein: 6.7 g/dL (ref 6.4–8.3)

## 2014-10-04 LAB — TSH CHCC: TSH: 1.076 m(IU)/L (ref 0.320–4.118)

## 2014-10-04 MED ORDER — SODIUM CHLORIDE 0.9 % IJ SOLN
10.0000 mL | INTRAMUSCULAR | Status: DC | PRN
  Administered 2014-10-04: 10 mL
  Filled 2014-10-04: qty 10

## 2014-10-04 MED ORDER — SODIUM CHLORIDE 0.9 % IV SOLN
Freq: Once | INTRAVENOUS | Status: AC
  Administered 2014-10-04: 09:00:00 via INTRAVENOUS

## 2014-10-04 MED ORDER — HEPARIN SOD (PORK) LOCK FLUSH 100 UNIT/ML IV SOLN
500.0000 [IU] | Freq: Once | INTRAVENOUS | Status: AC | PRN
  Administered 2014-10-04: 500 [IU]
  Filled 2014-10-04: qty 5

## 2014-10-04 MED ORDER — SODIUM CHLORIDE 0.9 % IV SOLN
2.9000 mg/kg | Freq: Once | INTRAVENOUS | Status: AC
  Administered 2014-10-04: 160 mg via INTRAVENOUS
  Filled 2014-10-04: qty 16

## 2014-10-04 MED ORDER — ALTEPLASE 2 MG IJ SOLR
2.0000 mg | Freq: Once | INTRAMUSCULAR | Status: AC
  Administered 2014-10-04: 2 mg
  Filled 2014-10-04: qty 2

## 2014-10-04 NOTE — Progress Notes (Signed)
0900 Port accessed and flushes easily without pain or or swelling noted. Patient states home health nurse has had difficulty with blood return. Cath flo instilled at 0900.  0940 Positive blood return noted. Cath flo removed, flushed. Patient tolerated well.

## 2014-10-04 NOTE — Patient Instructions (Addendum)
[facility] Discharge Instructions for Patients Receiving Chemotherapy  Today you received the following chemotherapy agents Nivolumab.  To help prevent nausea and vomiting after your treatment, we encourage you to take your nausea medication as directed.    If you develop nausea and vomiting that is not controlled by your nausea medication, call the clinic.   BELOW ARE SYMPTOMS THAT SHOULD BE REPORTED IMMEDIATELY:  *FEVER GREATER THAN 100.5 F  *CHILLS WITH OR WITHOUT FEVER  NAUSEA AND VOMITING THAT IS NOT CONTROLLED WITH YOUR NAUSEA MEDICATION  *UNUSUAL SHORTNESS OF BREATH  *UNUSUAL BRUISING OR BLEEDING  TENDERNESS IN MOUTH AND THROAT WITH OR WITHOUT PRESENCE OF ULCERS  *URINARY PROBLEMS  *BOWEL PROBLEMS  UNUSUAL RASH Items with * indicate a potential emergency and should be followed up as soon as possible.  Feel free to call the clinic you have any questions or concerns. The clinic phone number is [phone].  Please show the CHEMO ALERT CARD at check-in to the Emergency Department and triage nurse.  Nivolumab injection What is this medicine? NIVOLUMAB (nye VOL ue mab) is used to treat certain types of melanoma and lung cancer. This medicine may be used for other purposes; ask your health care provider or pharmacist if you have questions. COMMON BRAND NAME(S): Opdivo What should I tell my health care provider before I take this medicine? They need to know if you have any of these conditions: -eye disease, vision problems -history of pancreatitis -immune system problems -inflammatory bowel disease -kidney disease -liver disease -lung disease -lupus -myasthenia gravis -multiple sclerosis -organ transplant -stomach or intestine problems -thyroid disease -tingling of the fingers or toes, or other nerve disorder -an unusual or allergic reaction to nivolumab, other medicines, foods, dyes, or preservatives -pregnant or trying to get  pregnant -breast-feeding How should I use this medicine? This medicine is for infusion into a vein. It is given by a health care professional in a hospital or clinic setting. A special MedGuide will be given to you before each treatment. Be sure to read this information carefully each time. Talk to your pediatrician regarding the use of this medicine in children. Special care may be needed. Overdosage: If you think you've taken too much of this medicine contact a poison control center or emergency room at once. Overdosage: If you think you have taken too much of this medicine contact a poison control center or emergency room at once. NOTE: This medicine is only for you. Do not share this medicine with others. What if I miss a dose? It is important not to miss your dose. Call your doctor or health care professional if you are unable to keep an appointment. What may interact with this medicine? Interactions have not been studied. This list may not describe all possible interactions. Give your health care provider a list of all the medicines, herbs, non-prescription drugs, or dietary supplements you use. Also tell them if you smoke, drink alcohol, or use illegal drugs. Some items may interact with your medicine. What should I watch for while using this medicine? Tell your doctor or healthcare professional if your symptoms do not start to get better or if they get worse. Your condition will be monitored carefully while you are receiving this medicine. You may need blood work done while you are taking this medicine. What side effects may I notice from receiving this medicine? Side effects that you should report to your doctor or health care professional as soon as possible: -allergic reactions like   skin rash, itching or hives, swelling of the face, lips, or tongue -black, tarry stools -bloody or watery diarrhea -changes in vision -chills -cough -depressed mood -eye pain -feeling  anxious -fever -general ill feeling or flu-like symptoms -hair loss -loss of appetite -low blood counts - this medicine may decrease the number of white blood cells, red blood cells and platelets. You may be at increased risk for infections and bleeding -pain, tingling, numbness in the hands or feet -redness, blistering, peeling or loosening of the skin, including inside the mouth -red pinpoint spots on skin -signs of decreased platelets or bleeding - bruising, pinpoint red spots on the skin, black, tarry stools, blood in the urine -signs of decreased red blood cells - unusually weak or tired, feeling faint or lightheaded, falls -signs of infection - fever or chills, cough, sore throat, pain or trouble passing urine -signs and symptoms of a dangerous change in heartbeat or heart rhythm like chest pain; dizziness; fast or irregular heartbeat; palpitations; feeling faint or lightheaded, falls; breathing problems -signs and symptoms of high blood sugar such as dizziness; dry mouth; dry skin; fruity breath; nausea; stomach pain; increased hunger or thirst; increased urination -signs and symptoms of kidney injury like trouble passing urine or change in the amount of urine -signs and symptoms of liver injury like dark yellow or brown urine; general ill feeling or flu-like symptoms; light-colored stools; loss of appetite; nausea; right upper belly pain; unusually weak or tired; yellowing of the eyes or skin -signs and symptoms of increased potassium like muscle weakness; chest pain; or fast, irregular heartbeat -signs and symptoms of low potassium like muscle cramps or muscle pain; chest pain; dizziness; feeling faint or lightheaded, falls; palpitations; breathing problems; or fast, irregular heartbeat -swelling of the ankles, feet, hands -weight gainSide effects that usually do not require medical attention (report to your doctor or health care professional if they continue or are  bothersome): -constipation -general ill feeling or flu-like symptoms -hair loss -loss of appetite -nausea, vomiting This list may not describe all possible side effects. Call your doctor for medical advice about side effects. You may report side effects to FDA at 1-800-FDA-1088. Where should I keep my medicine? This drug is given in a hospital or clinic and will not be stored at home. NOTE: This sheet is a summary. It may not cover all possible information. If you have questions about this medicine, talk to your doctor, pharmacist, or health care provider.  2015, Elsevier/Gold Standard. (2013-06-04 13:18:19)  

## 2014-10-07 ENCOUNTER — Ambulatory Visit: Payer: Medicare Other

## 2014-10-07 ENCOUNTER — Other Ambulatory Visit: Payer: Medicare Other

## 2014-10-07 ENCOUNTER — Ambulatory Visit: Payer: Medicare Other | Admitting: Internal Medicine

## 2014-10-08 ENCOUNTER — Telehealth: Payer: Self-pay

## 2014-10-08 NOTE — Telephone Encounter (Signed)
-----   Message from Perlie Gold sent at 10/04/2014 11:04 AM EDT ----- Regarding: Chemo follow up-Mohamed Contact: [phone] First time Nivolumab. Please call. Dr. [name] patient.   Thanks, Barnetta Chapel, RN

## 2014-10-08 NOTE — Telephone Encounter (Signed)
Pt had one day of diarrhea then no more. He is eating and drinking well. He said he felt fine. He had no further questions.

## 2014-10-09 ENCOUNTER — Telehealth: Payer: Self-pay | Admitting: Medical Oncology

## 2014-10-09 ENCOUNTER — Encounter: Payer: Self-pay | Admitting: Skilled Nursing Facility1

## 2014-10-09 ENCOUNTER — Other Ambulatory Visit: Payer: Self-pay | Admitting: Medical Oncology

## 2014-10-09 NOTE — Progress Notes (Signed)
   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 The pt reports nausea and vomiting. The pt attributes it to Marinol. He feels that the sensation of nausea is stemming from his abdomen. The pt reports not needing to take pain medication anymore. The pt's wife reports his bilirubin in down and that the pt's jaundice has improved. Patient believes that his appetite has improved somewhat on the first day he took Miranol, but his appetite has since then subsided. Pt reports his abdominal drainage tube was removed.  Physical Exam:  Filed Vitals:   10/10/14 0828  BP: 143/89  Pulse: 71  Temp: 97.6 F (36.4 C)  Weight: 108 lb (48.988 kg)  SpO2: 99%   Pleasant gentleman who appears his stated age. Alert and oriented times three. The pt presents to the clinic in a wheelchair. No jaundice noted. His abdominal tube has been removed.  IMPRESSION: [name] is a 69 y.o. male with metastatic lung cancer to abdominal lymph nodes with excellent response to palliative radiation.   PLAN: He will f/u with Dr. [name] and continue his chemotherapy. I will see him on a PRN basis. He will probably stop the Marinol since it is not helping his appetite. We discussed the possibility of prescribing Reglan if his nausea does not improve.  This document serves as a record of services personally performed by Thea Silversmith, MD. It was created on her behalf by Darcus Austin, a trained medical scribe. The creation of this record is based on the scribe's personal observations and the provider's statements to them. This document has been checked and approved  by the attending provider.   Thea Silversmith, MD

## 2014-10-09 NOTE — Telephone Encounter (Signed)
Clarified  marinol refill from pharmacy

## 2014-10-09 NOTE — Progress Notes (Signed)
Subjective:     Patient ID: [name], male   DOB: [birth year], 69 y.o.   MRN: [id number]  HPI   Review of Systems     Objective:   Physical Exam To assist the pt in identifying dietary strategies to gain some lost wt back.    Assessment:     Pt identified as being malnourished due to losing some wt. Pt was contacted via the telephone at [phone]. Pt states he has lost 30 pounds (no time period given) and now weighs 107 pounds. Pt states his appetite is good but he is not eating as much as he used to before his diagnosis. Pt states he drinks about 2-3 ensures a week. Pt states he cannot tolerate garlic at the moment it makes him have diarrhea.      Plan:     Dietitian advised the pt attempt to drink one ensure a day. Dietitian offered some recipes for ensure as well as their website for more ideas. Dietitian advised he attempt to consume 6 small meals a day if his appetite declines as his treatment continues. Dietitian also offered some food ideas that are bland as well as some bland recipe ideas. Dietitian offered the facilities phone number if he has any further questions [phone].

## 2014-10-10 ENCOUNTER — Ambulatory Visit
Admission: RE | Admit: 2014-10-10 | Discharge: 2014-10-10 | Disposition: A | Discharge status: Home / Self Care | Payer: Medicare Other | Source: Ambulatory Visit | Attending: Radiation Oncology | Admitting: Radiation Oncology

## 2014-10-10 VITALS — BP 143/89 | HR 71 | Temp 97.6°F | Wt 108.0 lb

## 2014-10-10 DIAGNOSIS — C772 Secondary and unspecified malignant neoplasm of intra-abdominal lymph nodes: Secondary | ICD-10-CM

## 2014-10-10 NOTE — Progress Notes (Signed)
Routine follow up radiation to abdomen for pancreatic cancer.Denies pain.Has had increased nausea and vomiting especially since starting marinol this week.Takes compazine daily and up to 2 or 3 times.Also takes zofran.

## 2014-10-11 ENCOUNTER — Telehealth: Payer: Self-pay

## 2014-10-11 NOTE — Telephone Encounter (Signed)
[facility].  Sent to scan.

## 2014-10-14 ENCOUNTER — Telehealth: Payer: Self-pay | Admitting: Medical Oncology

## 2014-10-14 NOTE — Telephone Encounter (Signed)
Per Dr [name] there is no need for IVF at this time. Home health re certified.

## 2014-10-15 ENCOUNTER — Other Ambulatory Visit: Payer: Self-pay | Admitting: *Deleted

## 2014-10-15 DIAGNOSIS — C3490 Malignant neoplasm of unspecified part of unspecified bronchus or lung: Secondary | ICD-10-CM

## 2014-10-15 MED ORDER — PROCHLORPERAZINE MALEATE 10 MG PO TABS: 10.0000 mg | ORAL_TABLET | Freq: Four times a day (QID) | ORAL | Status: DC | PRN

## 2014-10-15 NOTE — Telephone Encounter (Signed)
TC from pt requesting refill on his compazine. Done.

## 2014-10-18 ENCOUNTER — Ambulatory Visit (HOSPITAL_BASED_OUTPATIENT_CLINIC_OR_DEPARTMENT_OTHER): Payer: Medicare Other

## 2014-10-18 ENCOUNTER — Encounter: Payer: Self-pay | Admitting: Physician Assistant

## 2014-10-18 ENCOUNTER — Telehealth: Payer: Self-pay | Admitting: Internal Medicine

## 2014-10-18 ENCOUNTER — Other Ambulatory Visit (HOSPITAL_BASED_OUTPATIENT_CLINIC_OR_DEPARTMENT_OTHER): Payer: Medicare Other

## 2014-10-18 ENCOUNTER — Other Ambulatory Visit: Payer: Self-pay | Admitting: Medical Oncology

## 2014-10-18 ENCOUNTER — Ambulatory Visit (HOSPITAL_BASED_OUTPATIENT_CLINIC_OR_DEPARTMENT_OTHER): Payer: Medicare Other | Admitting: Physician Assistant

## 2014-10-18 VITALS — BP 110/61 | HR 73 | Temp 98.3°F | Resp 17 | Ht 68.0 in | Wt 106.5 lb

## 2014-10-18 DIAGNOSIS — D61810 Antineoplastic chemotherapy induced pancytopenia: Secondary | ICD-10-CM

## 2014-10-18 DIAGNOSIS — C3411 Malignant neoplasm of upper lobe, right bronchus or lung: Secondary | ICD-10-CM

## 2014-10-18 DIAGNOSIS — Z5112 Encounter for antineoplastic immunotherapy: Secondary | ICD-10-CM | POA: Diagnosis present

## 2014-10-18 DIAGNOSIS — C3492 Malignant neoplasm of unspecified part of left bronchus or lung: Secondary | ICD-10-CM

## 2014-10-18 DIAGNOSIS — I959 Hypotension, unspecified: Secondary | ICD-10-CM

## 2014-10-18 DIAGNOSIS — R42 Dizziness and giddiness: Secondary | ICD-10-CM

## 2014-10-18 LAB — CBC WITH DIFFERENTIAL/PLATELET
BASO%: 0.1 % (ref 0.0–2.0)
Basophils Absolute: 0 10*3/uL (ref 0.0–0.1)
EOS%: 0 % (ref 0.0–7.0)
Eosinophils Absolute: 0 10*3/uL (ref 0.0–0.5)
HCT: 31.7 % — ABNORMAL LOW (ref 38.4–49.9)
HEMOGLOBIN: 10.6 g/dL — AB (ref 13.0–17.1)
LYMPH#: 1 10*3/uL (ref 0.9–3.3)
LYMPH%: 7.2 % — ABNORMAL LOW (ref 14.0–49.0)
MCH: 32 pg (ref 27.2–33.4)
MCHC: 33.4 g/dL (ref 32.0–36.0)
MCV: 95.8 fL (ref 79.3–98.0)
MONO#: 1.4 10*3/uL — ABNORMAL HIGH (ref 0.1–0.9)
MONO%: 9.8 % (ref 0.0–14.0)
NEUT#: 11.5 10*3/uL — ABNORMAL HIGH (ref 1.5–6.5)
NEUT%: 82.9 % — ABNORMAL HIGH (ref 39.0–75.0)
Platelets: 191 10*3/uL (ref 140–400)
RBC: 3.31 10*6/uL — AB (ref 4.20–5.82)
RDW: 16.3 % — ABNORMAL HIGH (ref 11.0–14.6)
WBC: 13.8 10*3/uL — ABNORMAL HIGH (ref 4.0–10.3)

## 2014-10-18 LAB — COMPREHENSIVE METABOLIC PANEL (CC13)
ALBUMIN: 2.8 g/dL — AB (ref 3.5–5.0)
ALT: 62 U/L — ABNORMAL HIGH (ref 0–55)
AST: 47 U/L — AB (ref 5–34)
Alkaline Phosphatase: 629 U/L — ABNORMAL HIGH (ref 40–150)
Anion Gap: 8 mEq/L (ref 3–11)
BUN: 17.8 mg/dL (ref 7.0–26.0)
CALCIUM: 10.1 mg/dL (ref 8.4–10.4)
CO2: 24 meq/L (ref 22–29)
Chloride: 101 mEq/L (ref 98–109)
Creatinine: 1.2 mg/dL (ref 0.7–1.3)
EGFR: 64 mL/min/{1.73_m2} — ABNORMAL LOW (ref >90)
Glucose: 137 mg/dl (ref 70–140)
POTASSIUM: 4.7 meq/L (ref 3.5–5.1)
SODIUM: 133 meq/L — AB (ref 136–145)
TOTAL PROTEIN: 6.9 g/dL (ref 6.4–8.3)
Total Bilirubin: 1.76 mg/dL — ABNORMAL HIGH (ref 0.20–1.20)

## 2014-10-18 MED ORDER — SODIUM CHLORIDE 0.9 % IV SOLN
Freq: Once | INTRAVENOUS | Status: AC
  Administered 2014-10-18: 13:00:00 via INTRAVENOUS

## 2014-10-18 MED ORDER — HEPARIN SOD (PORK) LOCK FLUSH 100 UNIT/ML IV SOLN
500.0000 [IU] | Freq: Once | INTRAVENOUS | Status: AC | PRN
  Administered 2014-10-18: 500 [IU]
  Filled 2014-10-18: qty 5

## 2014-10-18 MED ORDER — SODIUM CHLORIDE 0.9 % IV SOLN
140.0000 mg | Freq: Once | INTRAVENOUS | Status: AC
  Administered 2014-10-18: 140 mg via INTRAVENOUS
  Filled 2014-10-18: qty 14

## 2014-10-18 MED ORDER — SODIUM CHLORIDE 0.9 % IJ SOLN
10.0000 mL | INTRAMUSCULAR | Status: DC | PRN
  Administered 2014-10-18: 10 mL
  Filled 2014-10-18: qty 10

## 2014-10-18 MED ORDER — SODIUM CHLORIDE 0.9 % IV SOLN
INTRAVENOUS | Status: DC
  Administered 2014-10-18: 13:00:00 via INTRAVENOUS

## 2014-10-18 NOTE — Patient Instructions (Signed)
Follow up in 2 weeks, prior to your next scheduled cycle of immunotherapy 

## 2014-10-18 NOTE — Patient Instructions (Signed)
[facility] Discharge Instructions for Patients Receiving Chemotherapy  Today you received the following chemotherapy agents Nivolumab.  To help prevent nausea and vomiting after your treatment, we encourage you to take your nausea medication as directed.    If you develop nausea and vomiting that is not controlled by your nausea medication, call the clinic.   BELOW ARE SYMPTOMS THAT SHOULD BE REPORTED IMMEDIATELY:  *FEVER GREATER THAN 100.5 F  *CHILLS WITH OR WITHOUT FEVER  NAUSEA AND VOMITING THAT IS NOT CONTROLLED WITH YOUR NAUSEA MEDICATION  *UNUSUAL SHORTNESS OF BREATH  *UNUSUAL BRUISING OR BLEEDING  TENDERNESS IN MOUTH AND THROAT WITH OR WITHOUT PRESENCE OF ULCERS  *URINARY PROBLEMS  *BOWEL PROBLEMS  UNUSUAL RASH Items with * indicate a potential emergency and should be followed up as soon as possible.  Feel free to call the clinic you have any questions or concerns. The clinic phone number is [phone].  Please show the CHEMO ALERT CARD at check-in to the Emergency Department and triage nurse.  Nivolumab injection What is this medicine? NIVOLUMAB (nye VOL ue mab) is used to treat certain types of melanoma and lung cancer. This medicine may be used for other purposes; ask your health care provider or pharmacist if you have questions. COMMON BRAND NAME(S): Opdivo What should I tell my health care provider before I take this medicine? They need to know if you have any of these conditions: -eye disease, vision problems -history of pancreatitis -immune system problems -inflammatory bowel disease -kidney disease -liver disease -lung disease -lupus -myasthenia gravis -multiple sclerosis -organ transplant -stomach or intestine problems -thyroid disease -tingling of the fingers or toes, or other nerve disorder -an unusual or allergic reaction to nivolumab, other medicines, foods, dyes, or preservatives -pregnant or trying to get  pregnant -breast-feeding How should I use this medicine? This medicine is for infusion into a vein. It is given by a health care professional in a hospital or clinic setting. A special MedGuide will be given to you before each treatment. Be sure to read this information carefully each time. Talk to your pediatrician regarding the use of this medicine in children. Special care may be needed. Overdosage: If you think you've taken too much of this medicine contact a poison control center or emergency room at once. Overdosage: If you think you have taken too much of this medicine contact a poison control center or emergency room at once. NOTE: This medicine is only for you. Do not share this medicine with others. What if I miss a dose? It is important not to miss your dose. Call your doctor or health care professional if you are unable to keep an appointment. What may interact with this medicine? Interactions have not been studied. This list may not describe all possible interactions. Give your health care provider a list of all the medicines, herbs, non-prescription drugs, or dietary supplements you use. Also tell them if you smoke, drink alcohol, or use illegal drugs. Some items may interact with your medicine. What should I watch for while using this medicine? Tell your doctor or healthcare professional if your symptoms do not start to get better or if they get worse. Your condition will be monitored carefully while you are receiving this medicine. You may need blood work done while you are taking this medicine. What side effects may I notice from receiving this medicine? Side effects that you should report to your doctor or health care professional as soon as possible: -allergic reactions like   possible: -allergic reactions like skin rash, itching or hives, swelling of the face, lips, or tongue -black, tarry stools -bloody or watery diarrhea -changes in vision -chills -cough -depressed mood -eye pain -feeling  anxious -fever -general ill feeling or flu-like symptoms -hair loss -loss of appetite -low blood counts - this medicine may decrease the number of white blood cells, red blood cells and platelets. You may be at increased risk for infections and bleeding -pain, tingling, numbness in the hands or feet -redness, blistering, peeling or loosening of the skin, including inside the mouth -red pinpoint spots on skin -signs of decreased platelets or bleeding - bruising, pinpoint red spots on the skin, black, tarry stools, blood in the urine -signs of decreased red blood cells - unusually weak or tired, feeling faint or lightheaded, falls -signs of infection - fever or chills, cough, sore throat, pain or trouble passing urine -signs and symptoms of a dangerous change in heartbeat or heart rhythm like chest pain; dizziness; fast or irregular heartbeat; palpitations; feeling faint or lightheaded, falls; breathing problems -signs and symptoms of high blood sugar such as dizziness; dry mouth; dry skin; fruity breath; nausea; stomach pain; increased hunger or thirst; increased urination -signs and symptoms of kidney injury like trouble passing urine or change in the amount of urine -signs and symptoms of liver injury like dark yellow or brown urine; general ill feeling or flu-like symptoms; light-colored stools; loss of appetite; nausea; right upper belly pain; unusually weak or tired; yellowing of the eyes or skin -signs and symptoms of increased potassium like muscle weakness; chest pain; or fast, irregular heartbeat -signs and symptoms of low potassium like muscle cramps or muscle pain; chest pain; dizziness; feeling faint or lightheaded, falls; palpitations; breathing problems; or fast, irregular heartbeat -swelling of the ankles, feet, hands -weight gainSide effects that usually do not require medical attention (report to your doctor or health care professional if they continue or are  bothersome): -constipation -general ill feeling or flu-like symptoms -hair loss -loss of appetite -nausea, vomiting This list may not describe all possible side effects. Call your doctor for medical advice about side effects. You may report side effects to FDA at 1-800-FDA-1088. Where should I keep my medicine? This drug is given in a hospital or clinic and will not be stored at home. NOTE: This sheet is a summary. It may not cover all possible information. If you have questions about this medicine, talk to your doctor, pharmacist, or health care provider.  2015, Elsevier/Gold Standard. (2013-06-04 13:18:19)  Dehydration, Adult Dehydration is when you lose more fluids from the body than you take in. Vital organs like the kidneys, brain, and heart cannot function without a proper amount of fluids and salt. Any loss of fluids from the body can cause dehydration.  CAUSES   Vomiting.  Diarrhea.  Excessive sweating.  Excessive urine output.  Fever. SYMPTOMS  Mild dehydration  Thirst.  Dry lips.  Slightly dry mouth. Moderate dehydration  Very dry mouth.  Sunken eyes.  Skin does not bounce back quickly when lightly pinched and released.  Dark urine and decreased urine production.  Decreased tear production.  Headache. Severe dehydration  Very dry mouth.  Extreme thirst.  Rapid, weak pulse (more than 100 beats per minute at rest).  Cold hands and feet.  Not able to sweat in spite of heat and temperature.  Rapid breathing.  Blue lips.  Confusion and lethargy.  Difficulty being awakened.  Minimal urine production.  No tears. DIAGNOSIS  Your caregiver will diagnose dehydration based on your symptoms and your exam. Blood and urine tests will help confirm the diagnosis. The diagnostic evaluation should also identify the cause of dehydration. TREATMENT  Treatment of mild or moderate dehydration can often be done at home by increasing the amount of fluids  that you drink. It is best to drink small amounts of fluid more often. Drinking too much at one time can make vomiting worse. Refer to the home care instructions below. Severe dehydration needs to be treated at the hospital where you will probably be given intravenous (IV) fluids that contain water and electrolytes. HOME CARE INSTRUCTIONS   Ask your caregiver about specific rehydration instructions.  Drink enough fluids to keep your urine clear or pale yellow.  Drink small amounts frequently if you have nausea and vomiting.  Eat as you normally do.  Avoid:  Foods or drinks high in sugar.  Carbonated drinks.  Juice.  Extremely hot or cold fluids.  Drinks with caffeine.  Fatty, greasy foods.  Alcohol.  Tobacco.  Overeating.  Gelatin desserts.  Wash your hands well to avoid spreading bacteria and viruses.  Only take over-the-counter or prescription medicines for pain, discomfort, or fever as directed by your caregiver.  Ask your caregiver if you should continue all prescribed and over-the-counter medicines.  Keep all follow-up appointments with your caregiver. SEEK MEDICAL CARE IF:  You have abdominal pain and it increases or stays in one area (localizes).  You have a rash, stiff neck, or severe headache.  You are irritable, sleepy, or difficult to awaken.  You are weak, dizzy, or extremely thirsty. SEEK IMMEDIATE MEDICAL CARE IF:   You are unable to keep fluids down or you get worse despite treatment.  You have frequent episodes of vomiting or diarrhea.  You have blood or green matter (bile) in your vomit.  You have blood in your stool or your stool looks black and tarry.  You have not urinated in 6 to 8 hours, or you have only urinated a small amount of very dark urine.  You have a fever.  You faint. MAKE SURE YOU:   Understand these instructions.  Will watch your condition.  Will get help right away if you are not doing well or get  worse. Document Released: 03/15/2005 Document Revised: 06/07/2011 Document Reviewed: 11/02/2010 [facility] Patient Information 2015 [place], [place]. This information is not intended to replace advice given to you by your health care provider. Make sure you discuss any questions you have with your health care provider.

## 2014-10-18 NOTE — Telephone Encounter (Signed)
Gave and printed appt sched and avs for pt for Aug °

## 2014-10-18 NOTE — Progress Notes (Signed)
[place] [phone]   Fax:([phone]) [place], MD [address] [place] [address]  DIAGNOSIS AND DIAGNOSIS: Metastatic non-small cell lung cancer adenocarcinoma diagnosed in September 2010   PRIOR THERAPY:  #1 status post 6 cycles of systemic chemotherapy with carboplatin, Alimta and Avastin given every 3 weeks last dose given 04/23/2009 with disease stabilization.  #2 status post palliative radiotherapy to the mediastinum under the care of Dr. Pablo Ledger. The patient received a total dose of 3000 cGY and 12 fractions completed 01/21/2009.  #3 Maintenance chemotherapy with Alimta at 500 mg per meter squared and Avastin at 15 mg per kilogram given every 3 weeks status post 35 cycles.  #4 Maintenance chemotherapy with Alimta 500 mg/M2 and Avastin 15 mg/kg every 4 weeks,status post 17 cycles, discontinued secondary to disease progression.  #5 Systemic chemotherapy with carboplatin for AUC of 5, Alimta 500 mg/M2 and Avastin 15 mg/kg every 3 weeks, status post 3 cycles, last cycle was given 12/28/2012. Carboplatin was discontinued starting cycle #2 secondary to hypersensitivity reaction. #6  Maintenance chemotherapy again with Alimta 500 mg/M2 and Avastin 15 mg/kg every 3 weeks, first cycle 01/23/2013. Status post 6 cycles. #7 palliative radiotherapy to the large abdominal mass under the care of Dr. Pablo Ledger.  CURRENT THERAPY: Immunotherapy with Nivolumab 3 MG/KG every 2 weeks. First dose 10/04/2014. Status post 1 cycle  CHEMOTHERAPY INTENT: palliative.  CURRENT # OF CHEMOTHERAPY CYCLES: 2 CURRENT ANTIEMETICS: Compazine when necessary but clearly used.  CURRENT SMOKING STATUS: current nonsmoker  ORAL CHEMOTHERAPY AND CONSENT: None.  CURRENT BISPHOSPHONATES USE: None.  PAIN MANAGEMENT: No Pain  NARCOTICS INDUCED CONSTIPATION: N/A  LIVING WILL AND CODE STATUS: NCB   INTERVAL HISTORY:  [name] 69  y.o. male returns to the clinic today for follow up visit accompanied by his wife. The patient completed palliative radiotherapy to the large abdominal mass. He continue to have some mild right abdominal pain. He complains of decreased appetite and difficulty swallowing, particularly with "dry foods" like bread or toast. He complains of dizziness and continue weight loss. He is feeling better today. His serum bilirubin continues to improve but is still elevated.He tolerated his first cycle of Nivolumab without difficulty. He denied any diarrhea, changes in his baseline shortness of breath, rashes, headaches or vision changes.He denied having any significant chest pain, shortness of breath, cough or hemoptysis. He is minimally icteric. He has no current fever or chills. No nausea or vomiting. He reports frequent urination a night since being discharged from the hospital. He is here today to proceed with cycle #2 of immunotherapy with Nivolmab. He and his wife would like to change the date of his next cycle from August 5th to August 4th so that they can attend a [facility].  MEDICAL HISTORY: Past Medical History  Diagnosis Date  . Hypertension   . Arthritis RIGHT SHOULDER  . Immature cataract BILATERAL  . Non-small cell lung cancer DX SEPT 2010  W/ CHEMORADIATION AT THAT TIME -- NOW  W/ METS--  CURRENTLY ON MAINTENANCE CHEMO TX  EVERY 30 DAYS    ONCOLOGIST- DR [facility]  . Acute meniscal tear of knee RIGHT KNEE  . BPH (benign prostatic hypertrophy)   . Dizziness and giddiness 04/23/2014  . Tremor 04/23/2014    Right hand  . HOH (hard of hearing)     bilateral hearing aids  . Radiation 01/08/09-01/21/09    Mediastinum 30 Gy  x 12 fractions  . Radiation 08/19/14-09/04/14    Palliative RT [address] LN 30 Gy    ALLERGIES:  is allergic to augmentin; carboplatin; levaquin; percocet; and zolpidem tartrate.  MEDICATIONS:  Current Outpatient Prescriptions  Medication Sig Dispense Refill  .  Ascorbic Acid (VITAMIN C) 1000 MG tablet Take 1,000 mg by mouth daily.    Marland Kitchen atenolol (TENORMIN) 100 MG tablet Take 100 mg by mouth every morning. Pt taking '50mg'$  once daily    . FeFum-FePoly-FA-B Cmp-C-Biot (INTEGRA PLUS PO) Take by mouth every morning.    . ferrous sulfate 325 (65 FE) MG tablet Take 1 tablet (325 mg total) by mouth 3 (three) times daily with meals. 90 tablet 0  . LORazepam (ATIVAN) 0.5 MG tablet Take 1 tablet (0.5 mg total) by mouth every 6 (six) hours as needed (nausea). 30 tablet 0  . Multiple Vitamin (MULTIVITAMIN) tablet Take 1 tablet by mouth daily.     Marland Kitchen MYRBETRIQ 50 MG TB24 tablet Take 50 mg by mouth every evening.     Marland Kitchen omeprazole (PRILOSEC) 40 MG capsule Take 1 capsule (40 mg total) by mouth every evening. (Patient taking differently: Take 40 mg by mouth every morning. ) 90 capsule 3  . ondansetron (ZOFRAN) 8 MG tablet Take 1 tablet (8 mg total) by mouth every 8 (eight) hours as needed for nausea or vomiting. 30 tablet 2  . prochlorperazine (COMPAZINE) 10 MG tablet Take 1 tablet (10 mg total) by mouth every 6 (six) hours as needed for nausea or vomiting. 30 tablet 1  . Sennosides-Docusate Sodium (SENOKOT S PO) Take 2 tablets by mouth daily as needed (constipation).      Current Facility-Administered Medications  Medication Dose Route Frequency Provider Last Rate Last Dose  . 0.9 %  sodium chloride infusion   Intravenous Continuous Carlton Adam, PA-C 500 mL/hr at 10/18/14 1328     Facility-Administered Medications Ordered in Other Visits  Medication Dose Route Frequency Provider Last Rate Last Dose  . sodium chloride 0.9 % injection 10 mL  10 mL Intracatheter PRN [name], MD   10 mL at 10/18/14 1541    SURGICAL HISTORY:  Past Surgical History  Procedure Laterality Date  . 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 Rotator cuff repair  2008    LEFT SHOULDER  . Bilateral ear drum surgery   1960'S  . Orif left ring finger  and revascularization of radial side  02-16-2007    DEGLOVING INJURY  . Knee arthroscopy  12/21/2011    Procedure: ARTHROSCOPY KNEE;  Surgeon: Tobi Bastos, MD;  Location: [facility];  Service: Orthopedics;  Laterality: Right;  WITH MEDIAL MENISECTOMY  . Cataract extraction Bilateral   . Tonsillectomy      as child  . Ovarian cyst surgery      removed from left jaw area- benign  . Ovarian cyst surgery       NOTE; pt unaware of why this is in his chart  . Shoulder open rotator cuff repair Right 06/05/2014    Procedure: RIGHT ROTATOR CUFF REPAIR SHOULDER OPEN;  Surgeon: Latanya Maudlin, MD;  Location: [facility];  Service: Orthopedics;  Laterality: Right;  . Ercp N/A 08/09/2014    Procedure: ENDOSCOPIC RETROGRADE CHOLANGIOPANCREATOGRAPHY (ERCP);  Surgeon: Carol Ada, MD;  Location: [facility];  Service: Endoscopy;  Laterality: N/A;  . Eus N/A 08/09/2014    Procedure: UPPER ENDOSCOPIC ULTRASOUND (  EUS) RADIAL;  Surgeon: Carol Ada, MD;  Location: [facility];  Service: Endoscopy;  Laterality: N/A;  . Fine needle aspiration N/A 08/09/2014    Procedure: FINE NEEDLE ASPIRATION (FNA) LINEAR;  Surgeon: Carol Ada, MD;  Location: [facility];  Service: Endoscopy;  Laterality: N/A;  . Esophagogastroduodenoscopy (egd) with propofol N/A 08/09/2014    Procedure: ESOPHAGOGASTRODUODENOSCOPY (EGD) WITH PROPOFOL;  Surgeon: Carol Ada, MD;  Location: [facility];  Service: Endoscopy;  Laterality: N/A;    REVIEW OF SYSTEMS:  Constitutional: positive for anorexia, fatigue and weight loss Eyes: negative Ears, nose, mouth, throat, and face: positive for sore throat and discomfort with swallowing Respiratory: negative Cardiovascular: negative Gastrointestinal: negative Genitourinary:negative Integument/breast: negative Hematologic/lymphatic: negative Musculoskeletal:negative Neurological: negative Behavioral/Psych: negative Endocrine:  negative Allergic/Immunologic: negative   PHYSICAL EXAMINATION: General appearance: alert, cooperative, fatigued, icteric and no distress Head: Normocephalic, without obvious abnormality, atraumatic Neck: no adenopathy, no JVD, supple, symmetrical, trachea midline and thyroid not enlarged, symmetric, no tenderness/mass/nodules Lymph nodes: Cervical, supraclavicular, and axillary nodes normal. Resp: clear to auscultation bilaterally Back: symmetric, no curvature. ROM normal. No CVA tenderness. Cardio: regular rate and rhythm, S1, S2 normal, no murmur, click, rub or gallop GI: soft, non-tender; bowel sounds normal; no masses,  no organomegaly Extremities: extremities normal, atraumatic, no cyanosis or edema Neurologic: Alert and oriented X 3, normal strength and tone. Normal symmetric reflexes. Normal coordination and gait  ECOG PERFORMANCE STATUS: 1 - Symptomatic but completely ambulatory  Blood pressure 110/61, pulse 73, temperature 98.3 F (36.8 C), temperature source Oral, resp. rate 17, height '5\' 8"'$  (1.727 m), weight 106 lb 8 oz (48.308 kg), SpO2 99 %.  LABORATORY DATA: Lab Results  Component Value Date   WBC 13.8* 10/18/2014   HGB 10.6* 10/18/2014   HCT 31.7* 10/18/2014   MCV 95.8 10/18/2014   PLT 191 10/18/2014      Chemistry      Component Value Date/Time   NA 133* 10/18/2014 1152   NA 138 09/24/2014 0530   K 4.7 10/18/2014 1152   K 3.1* 09/24/2014 0530   CL 107 09/24/2014 0530   CL 109* 08/29/2012 0856   CO2 24 10/18/2014 1152   CO2 25 09/24/2014 0530   BUN 17.8 10/18/2014 1152   BUN 10 09/24/2014 0530   CREATININE 1.2 10/18/2014 1152   CREATININE 0.90 09/24/2014 0530      Component Value Date/Time   CALCIUM 10.1 10/18/2014 1152   CALCIUM 7.9* 09/24/2014 0530   ALKPHOS 629* 10/18/2014 1152   ALKPHOS 636* 09/20/2014 2205   AST 47* 10/18/2014 1152   AST 91* 09/20/2014 2205   ALT 62* 10/18/2014 1152   ALT 88* 09/20/2014 2205   BILITOT 1.76* 10/18/2014 1152    BILITOT 4.8* 09/20/2014 2205       RADIOGRAPHIC STUDIES: Dg Chest 2 View  09/20/2014   CLINICAL DATA:  Left-sided drain infection. Right-sided abdominal pain and fever. Initial encounter.  EXAM: CHEST  2 VIEW  COMPARISON:  Chest radiograph performed 08/25/2014  FINDINGS: The lungs are well-aerated. A new small right pleural effusion is noted, with associated airspace opacity. The left lung appears clear. No pneumothorax is identified.  The heart remains normal in size. A right-sided chest port is noted ending about the distal SVC. No acute osseous abnormalities are identified.  IMPRESSION: New small right pleural effusion, with associated airspace opacity.   Electronically Signed   By: Garald Balding M.D.   On: 09/20/2014 22:13   Ct Abdomen Pelvis W Contrast  09/21/2014  CLINICAL DATA:  Acute onset of right-sided abdominal pain. Fever. Initial encounter.  EXAM: CT ABDOMEN AND PELVIS WITH CONTRAST  TECHNIQUE: Multidetector CT imaging of the abdomen and pelvis was performed using the standard protocol following bolus administration of intravenous contrast.  CONTRAST:  114m OMNIPAQUE IOHEXOL 300 MG/ML  SOLN  COMPARISON:  CT of the abdomen and pelvis from 09/09/2014  FINDINGS: A small right-sided pleural effusion is noted, with associated atelectasis. Diffuse coronary artery calcifications are seen. Trace pericardial fluid remains within normal limits.  There is mild stable underlying prominence of the intrahepatic biliary ducts. A biliary stent appears patent, and is filled with air. Mild left-sided pneumobilia is again noted. An underlying large mass is again seen at the porta hepatis, measuring approximately 6.0 x 4.5 x 4.6 cm, compatible with metastatic nodal disease as previously described.  Trace pericholecystic fluid is nonspecific given the adjacent metastasis, and appears stable from the prior study. The gallbladder is otherwise unremarkable. The pancreas and adrenal glands are within normal limits.   Additional periaortic and paracaval nodes, measuring up to 1.5 cm in short axis, are also concerning for metastatic disease, and appear grossly stable from the prior study.  A 1.8 cm cyst is noted at the interpole region of the left kidney, with mild scarring at the upper pole of the right kidney. Mild nonspecific perinephric stranding is noted bilaterally. There is no evidence of hydronephrosis. No renal or ureteral stones are seen.  No free fluid is identified. The small bowel is unremarkable in appearance. The stomach is within normal limits. No acute vascular abnormalities are seen. Diffuse calcification is seen along the abdominal aorta, with mild associated mural thrombus.  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  The appendix is not definitely characterized; there is no evidence of appendicitis. The colon is unremarkable in appearance.  The bladder is mildly distended and grossly unremarkable in appearance. The prostate is normal in size, with scattered calcification. No inguinal lymphadenopathy is seen.  No acute osseous abnormalities are identified.  IMPRESSION: 1. No new abnormality seen to explain the patient's symptoms. 2. Relatively stable appearance to marked lymphadenopathy at the porta hepatis, and persistent retroperitoneal metastatic lymphadenopathy. 3. Common bile duct stent is stable in appearance, with mild stable dilatation of intrahepatic biliary ducts and left-sided pneumobilia. 4. Persistent small right pleural effusion and associated atelectasis, slightly increased from the prior study. 5. Diffuse coronary artery calcifications seen. 6. Trace pericholecystic fluid is stable from the prior study, and is nonspecific given the adjacent metastasis at the porta hepatis. 7. 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 Recommend interval  follow-up was discussed on the prior study. 8. Diffuse calcification along the abdominal aorta and its branches, with mild associated mural thrombus.   Electronically Signed   By: JGarald BaldingM.D.   On: 09/21/2014 01:15   ASSESSMENT AND PLAN: This is a very pleasant 69years old white male with metastatic non-small cell lung cancer, adenocarcinoma underwent several chemotherapy regimens including recent treatment with systemic chemotherapy with Alimta and Avastin status post 6 cycles. The patient tolerated his previous treatment fairly well except for the increasing fatigue and weakness as well as pancytopenia. He was on observation for 16 months.  Unfortunately the recent imaging studies showed evidence for significant disease progression involving the chest as well as abdominal masses that was biopsy proven to be poorly  differentiated carcinoma favoring adenocarcinoma. He completed palliative radiotherapy to the large abdominal mass. He is currently receiving treatment with immunotherapy with Nivolumab 3 MG/KG every 2 weeks. He is status post 1 cycle. He will proceed with cycle #2 today as scheduled. His by mouth intake has been decreased and he is symptomatic, I will arrange for him to receive 1 liter of normal saline for hydration today. To allow him to attend his [facility], we will reschedule cycle #3 to occur on 10/31/2014. If his frequent urination at night continues, we will check for possible infection.  For the lack of appetite, he will continue on Marinol 2.5 mg by mouth twice a day and I again encouraged him to increase his oral intake. For pain management the patient will continue on the current pain medication. He was advised to call immediately if he has any concerning symptoms in the interval. The patient voices understanding of current disease status and treatment options and is in agreement with the current care plan.  All questions were answered. The patient knows to call  the clinic with any problems, questions or concerns. We can certainly see the patient much sooner if necessary.  Carlton Adam, PA-C 10/18/2014   Disclaimer: This note was dictated with voice recognition software. Similar sounding words can inadvertently be transcribed and may not be corrected upon review.

## 2014-10-18 NOTE — Progress Notes (Unsigned)
VO OK to treat with creatinine values on today's CMET - 1.76 Awilda Metro PA

## 2014-10-21 ENCOUNTER — Other Ambulatory Visit: Payer: Medicare Other

## 2014-10-21 ENCOUNTER — Telehealth: Payer: Self-pay | Admitting: *Deleted

## 2014-10-21 ENCOUNTER — Ambulatory Visit: Payer: Medicare Other

## 2014-10-21 ENCOUNTER — Other Ambulatory Visit: Payer: Self-pay | Admitting: Medical Oncology

## 2014-10-21 DIAGNOSIS — E860 Dehydration: Secondary | ICD-10-CM

## 2014-10-21 DIAGNOSIS — C3492 Malignant neoplasm of unspecified part of left bronchus or lung: Secondary | ICD-10-CM

## 2014-10-21 NOTE — Telephone Encounter (Signed)
I do not encourage it but ok to do it for 2 weeks, 2-3 times a week.

## 2014-10-21 NOTE — Telephone Encounter (Signed)
[facility] notified to give home IVF.Orders faxed.

## 2014-10-21 NOTE — Telephone Encounter (Signed)
"  [name] received IVF Friday.  Ate Friday night, Saturday and Sunday he's really struggling.  I need guidance, help or advice.  Can [name] ask Dr. [name] for IVF?  He's ready to give up and I'm not sure what I can do to bring him back."  Observed this call and orders.

## 2014-10-21 NOTE — Telephone Encounter (Signed)
PT. IS TIRED AND NAUSEATED. HE STATES HIS ANTI NAUSEA MEDICATIONS DO NOT HELP. PT. FEELS WORSE WHEN HE TAKES THESE MEDICATIONS. HE IS EATING AND HIS FLUID INTAKE IN THE PAST 24 HOURS HAS BEEN 24 OUNCES. PT. FEELS HE IS DEHYDRATED. PT. WOULD LIKE AN [facility] NURSE COME TO HIS HOUSE AND GIVE HIM IV FLUIDS AS WAS DONE BEFORE. ENCOURAGED PT. TO KEEP A SMALL GLASS OF FLUIDS WITH HIM ALL THE TIME AND DRINK TWO TO THREE OUNCES EVERY HOUR WHICH WOULD BE MORE HELPFUL IN PREVENTING DEHYDRATION THAN A BAG OF IV FLUIDS. PT. STILL WANTS A NURSE TO GIVE HIM FLUIDS AT HIS HOME.

## 2014-10-24 ENCOUNTER — Telehealth: Payer: Self-pay | Admitting: *Deleted

## 2014-10-24 DIAGNOSIS — C3492 Malignant neoplasm of unspecified part of left bronchus or lung: Secondary | ICD-10-CM

## 2014-10-24 MED ORDER — INTEGRA PLUS PO CAPS: 1.0000 | ORAL_CAPSULE | Freq: Every morning | ORAL | Status: DC

## 2014-10-24 NOTE — Telephone Encounter (Signed)
"  Dr. Merla Riches prescribed Polysaccharide iron plus.  I need a refill sent to [facility].  Thanks."

## 2014-10-24 NOTE — Telephone Encounter (Signed)
Refill done and pt notified.

## 2014-10-28 HISTORY — PX: GASTROSTOMY TUBE PLACEMENT: SHX655

## 2014-10-30 ENCOUNTER — Other Ambulatory Visit: Payer: Self-pay | Admitting: Gastroenterology

## 2014-10-30 DIAGNOSIS — R1310 Dysphagia, unspecified: Secondary | ICD-10-CM

## 2014-10-31 ENCOUNTER — Ambulatory Visit (HOSPITAL_BASED_OUTPATIENT_CLINIC_OR_DEPARTMENT_OTHER): Payer: Medicare Other | Admitting: Nurse Practitioner

## 2014-10-31 ENCOUNTER — Ambulatory Visit (HOSPITAL_BASED_OUTPATIENT_CLINIC_OR_DEPARTMENT_OTHER): Payer: Medicare Other

## 2014-10-31 ENCOUNTER — Telehealth: Payer: Self-pay | Admitting: Internal Medicine

## 2014-10-31 ENCOUNTER — Other Ambulatory Visit (HOSPITAL_BASED_OUTPATIENT_CLINIC_OR_DEPARTMENT_OTHER): Payer: Medicare Other

## 2014-10-31 ENCOUNTER — Ambulatory Visit
Admission: RE | Admit: 2014-10-31 | Discharge: 2014-10-31 | Disposition: A | Discharge status: Home / Self Care | Payer: Medicare Other | Source: Ambulatory Visit | Attending: Gastroenterology | Admitting: Gastroenterology

## 2014-10-31 VITALS — BP 106/71 | HR 100 | Temp 98.0°F | Resp 18 | Ht 68.0 in | Wt 103.7 lb

## 2014-10-31 VITALS — BP 136/81 | HR 90 | Temp 97.7°F | Resp 18

## 2014-10-31 DIAGNOSIS — C3492 Malignant neoplasm of unspecified part of left bronchus or lung: Secondary | ICD-10-CM

## 2014-10-31 DIAGNOSIS — C3491 Malignant neoplasm of unspecified part of right bronchus or lung: Secondary | ICD-10-CM

## 2014-10-31 DIAGNOSIS — R634 Abnormal weight loss: Secondary | ICD-10-CM | POA: Diagnosis not present

## 2014-10-31 DIAGNOSIS — Z79899 Other long term (current) drug therapy: Secondary | ICD-10-CM

## 2014-10-31 DIAGNOSIS — R630 Anorexia: Secondary | ICD-10-CM | POA: Diagnosis not present

## 2014-10-31 DIAGNOSIS — Z452 Encounter for adjustment and management of vascular access device: Secondary | ICD-10-CM | POA: Diagnosis not present

## 2014-10-31 DIAGNOSIS — R1310 Dysphagia, unspecified: Secondary | ICD-10-CM

## 2014-10-31 DIAGNOSIS — R740 Nonspecific elevation of levels of transaminase and lactic acid dehydrogenase [LDH]: Secondary | ICD-10-CM | POA: Diagnosis not present

## 2014-10-31 DIAGNOSIS — C3490 Malignant neoplasm of unspecified part of unspecified bronchus or lung: Secondary | ICD-10-CM

## 2014-10-31 DIAGNOSIS — R5382 Chronic fatigue, unspecified: Secondary | ICD-10-CM

## 2014-10-31 DIAGNOSIS — Z95828 Presence of other vascular implants and grafts: Secondary | ICD-10-CM

## 2014-10-31 LAB — COMPREHENSIVE METABOLIC PANEL (CC13)
ALT: 70 U/L — ABNORMAL HIGH (ref 0–55)
AST: 45 U/L — AB (ref 5–34)
Albumin: 2.4 g/dL — ABNORMAL LOW (ref 3.5–5.0)
Alkaline Phosphatase: 574 U/L — ABNORMAL HIGH (ref 40–150)
Anion Gap: 7 mEq/L (ref 3–11)
BUN: 14.2 mg/dL (ref 7.0–26.0)
CHLORIDE: 106 meq/L (ref 98–109)
CO2: 24 mEq/L (ref 22–29)
CREATININE: 0.9 mg/dL (ref 0.7–1.3)
Calcium: 9.3 mg/dL (ref 8.4–10.4)
EGFR: 86 mL/min/{1.73_m2} — AB (ref >90)
Glucose: 156 mg/dl — ABNORMAL HIGH (ref 70–140)
POTASSIUM: 4.2 meq/L (ref 3.5–5.1)
Sodium: 137 mEq/L (ref 136–145)
TOTAL PROTEIN: 6.5 g/dL (ref 6.4–8.3)
Total Bilirubin: 1.05 mg/dL (ref 0.20–1.20)

## 2014-10-31 LAB — CBC WITH DIFFERENTIAL/PLATELET
BASO%: 0.3 % (ref 0.0–2.0)
BASOS ABS: 0 10*3/uL (ref 0.0–0.1)
EOS%: 0.2 % (ref 0.0–7.0)
Eosinophils Absolute: 0 10*3/uL (ref 0.0–0.5)
HCT: 30.6 % — ABNORMAL LOW (ref 38.4–49.9)
HEMOGLOBIN: 10.2 g/dL — AB (ref 13.0–17.1)
LYMPH%: 4.1 % — ABNORMAL LOW (ref 14.0–49.0)
MCH: 32.1 pg (ref 27.2–33.4)
MCHC: 33.4 g/dL (ref 32.0–36.0)
MCV: 96.3 fL (ref 79.3–98.0)
MONO#: 0.7 10*3/uL (ref 0.1–0.9)
MONO%: 6.6 % (ref 0.0–14.0)
NEUT#: 9.6 10*3/uL — ABNORMAL HIGH (ref 1.5–6.5)
NEUT%: 88.8 % — AB (ref 39.0–75.0)
Platelets: 230 10*3/uL (ref 140–400)
RBC: 3.17 10*6/uL — AB (ref 4.20–5.82)
RDW: 15.5 % — AB (ref 11.0–14.6)
WBC: 10.8 10*3/uL — AB (ref 4.0–10.3)
lymph#: 0.4 10*3/uL — ABNORMAL LOW (ref 0.9–3.3)

## 2014-10-31 LAB — TSH CHCC: TSH: 0.924 m(IU)/L (ref 0.320–4.118)

## 2014-10-31 MED ORDER — HEPARIN SOD (PORK) LOCK FLUSH 100 UNIT/ML IV SOLN
500.0000 [IU] | Freq: Once | INTRAVENOUS | Status: AC
  Administered 2014-10-31: 500 [IU] via INTRAVENOUS
  Filled 2014-10-31: qty 5

## 2014-10-31 MED ORDER — SODIUM CHLORIDE 0.9 % IV SOLN
INTRAVENOUS | Status: AC
  Administered 2014-10-31: 14:00:00 via INTRAVENOUS

## 2014-10-31 MED ORDER — SODIUM CHLORIDE 0.9 % IJ SOLN
10.0000 mL | INTRAMUSCULAR | Status: DC | PRN
  Administered 2014-10-31: 10 mL via INTRAVENOUS
  Filled 2014-10-31: qty 10

## 2014-10-31 NOTE — Progress Notes (Signed)
[place] OFFICE PROGRESS NOTE   DIAGNOSIS AND DIAGNOSIS: Metastatic non-small cell lung cancer adenocarcinoma diagnosed in September 2010   PRIOR THERAPY:  #1 status post 6 cycles of systemic chemotherapy with carboplatin, Alimta and Avastin given every 3 weeks last dose given 04/23/2009 with disease stabilization.  #2 status post palliative radiotherapy to the mediastinum under the care of Dr. Pablo Ledger. The patient received a total dose of 3000 cGY and 12 fractions completed 01/21/2009.  #3 Maintenance chemotherapy with Alimta at 500 mg per meter squared and Avastin at 15 mg per kilogram given every 3 weeks status post 35 cycles.  #4 Maintenance chemotherapy with Alimta 500 mg/M2 and Avastin 15 mg/kg every 4 weeks,status post 17 cycles, discontinued secondary to disease progression.  #5 Systemic chemotherapy with carboplatin for AUC of 5, Alimta 500 mg/M2 and Avastin 15 mg/kg every 3 weeks, status post 3 cycles, last cycle was given 12/28/2012. Carboplatin was discontinued starting cycle #2 secondary to hypersensitivity reaction. #6 Maintenance chemotherapy again with Alimta 500 mg/M2 and Avastin 15 mg/kg every 3 weeks, first cycle 01/23/2013. Status post 6 cycles. #7 palliative radiotherapy to the large abdominal mass under the care of Dr. Pablo Ledger.  CURRENT THERAPY: Immunotherapy with Nivolumab 3 MG/KG every 2 weeks. First dose 10/04/2014. Status post 2 cycles     INTERVAL HISTORY:   Mr. [name] returns as scheduled. He completed cycle 2 nivolumab on 10/18/2014. He reports stable baseline nausea. No rash. No diarrhea. He has stable dyspnea. No fever or pain. He continues to feel weak and tired. He feels better for about 4 hours after receiving IV fluids. He feels like food becomes "stuck" with swallowing. He is tolerating liquids. He continues to lose weight.  Objective:  Vital signs in last 24 hours:  Blood pressure 106/71, pulse 100, temperature 98 F  (36.7 C), temperature source Oral, resp. rate 18, height '5\' 8"'$  (1.727 m), weight 103 lb 11.2 oz (47.038 kg), SpO2 100 %.    HEENT: No thrush or ulcers. Resp: Lungs clear bilaterally. Cardio: Regular rate and rhythm. GI: Abdomen soft and nontender. Vascular: No leg edema. Port-A-Cath without erythema.  Lab Results:  Lab Results  Component Value Date   WBC 10.8* 10/31/2014   HGB 10.2* 10/31/2014   HCT 30.6* 10/31/2014   MCV 96.3 10/31/2014   PLT 230 10/31/2014   NEUTROABS 9.6* 10/31/2014    Imaging:  Dg Esophagus  10/31/2014   CLINICAL DATA:  Dysphagia  EXAM: ESOPHOGRAM/BARIUM SWALLOW  TECHNIQUE: Single contrast examination was performed using  thin barium.  FLUOROSCOPY TIME:  Radiation Exposure Index (as provided by the fluoroscopic device): 36 Gy per sq cm  If the device does not provide the exposure index:  Fluoroscopy Time:  1 minutes 42 seconds  Number of Acquired Images:  COMPARISON:  CT chest of 08/06/2014  FINDINGS: The patient was not able to stand for an adequate length of time, and therefore a lateral series with rapid sequence spot spot films of the cervical esophagus was performed. The swallowing mechanism is unremarkable. No aspiration is seen. There are mild tertiary contractions in the mid and distal esophagus. No definite hiatal hernia is seen. No gastroesophageal reflux is demonstrated.  IMPRESSION: Mild tertiary contractions in the mid and distal esophagus. No hiatal hernia. No definite reflux.   Electronically Signed   By: Ivar Drape M.D.   On: 10/31/2014 10:39    Medications: I have reviewed the patient's current medications.  Assessment/Plan:  1. Metastatic non-small cell lung cancer  currently on active treatment with nivolumab. He has completed 2 cycles. 2. Anorexia/weight loss, dysphagia.  Disposition: [name] has a poor performance status. He has completed 2 cycles of nivolumab. He is experiencing anorexia/weight loss and dysphagia. He and his wife are  interested in placement of a feeding tube. He does not want to have this done endoscopically. We made a referral to interventional radiology. We also made a referral to the [place] dietitian for management of the tube feedings. Dr. [name] recommends holding cycle 3 nivolumab today and rescheduling for one week. In place of the nivolumab we will give a liter of IV fluids. He will return for a follow-up visit in one week.  Patient seen with Dr. [name].  Ned Card ANP/GNP-BC   10/31/2014  12:32 PM  ADDENDUM: Hematology/Oncology Attending: I had a face to face encounter with the patient. I recommended his care plan. This is a very pleasant 69 years old white male with metastatic non-small cell lung cancer with recent evidence for disease progression with large abdominal mass as well as obstructive jaundice status post palliative radiotherapy and he is currently undergoing treatment with immunotherapy with Nivolumab status post 2 cycles. The patient presented today with significant weight loss as well as lack of appetite and dysphagia. His by mouth intake is very poor at this point. I had a lengthy discussion with the patient and his wife about his current condition and treatment options. I strongly recommended for the patient to have a PEG tube placement for nutritional supplements. He and his wife agreed to this option and he will be referred to interventional radiology for PEG tube placement. He is not interested to do it by gastroenterology as I was planning to refer him to Dr. Collene Mares. I will refer the patient to the dietitian at the [facility] for evaluation and management of his supplemental nutrition. We will delay the start of cycle #3 by 1 week for this procedure to be done and for the patient to feel a little bit better. He would come back for follow-up visit in one week for reevaluation. The patient was advised to call immediately if he has any concerning symptoms in the  interval.  Disclaimer: This note was dictated with voice recognition software. Similar sounding words can inadvertently be transcribed and may be missed upon review. Eilleen Kempf., MD 11/04/2014

## 2014-10-31 NOTE — Telephone Encounter (Signed)
Gave adn printed appt sched and avs for pt for Aug

## 2014-10-31 NOTE — Patient Instructions (Signed)
Dehydration, Adult Dehydration is when you lose more fluids from the body than you take in. Vital organs like the kidneys, brain, and heart cannot function without a proper amount of fluids and salt. Any loss of fluids from the body can cause dehydration.  CAUSES   Vomiting.  Diarrhea.  Excessive sweating.  Excessive urine output.  Fever. SYMPTOMS  Mild dehydration  Thirst.  Dry lips.  Slightly dry mouth. Moderate dehydration  Very dry mouth.  Sunken eyes.  Skin does not bounce back quickly when lightly pinched and released.  Dark urine and decreased urine production.  Decreased tear production.  Headache. Severe dehydration  Very dry mouth.  Extreme thirst.  Rapid, weak pulse (more than 100 beats per minute at rest).  Cold hands and feet.  Not able to sweat in spite of heat and temperature.  Rapid breathing.  Blue lips.  Confusion and lethargy.  Difficulty being awakened.  Minimal urine production.  No tears. DIAGNOSIS  Your caregiver will diagnose dehydration based on your symptoms and your exam. Blood and urine tests will help confirm the diagnosis. The diagnostic evaluation should also identify the cause of dehydration. TREATMENT  Treatment of mild or moderate dehydration can often be done at home by increasing the amount of fluids that you drink. It is best to drink small amounts of fluid more often. Drinking too much at one time can make vomiting worse. Refer to the home care instructions below. Severe dehydration needs to be treated at the hospital where you will probably be given intravenous (IV) fluids that contain water and electrolytes. HOME CARE INSTRUCTIONS   Ask your caregiver about specific rehydration instructions.  Drink enough fluids to keep your urine clear or pale yellow.  Drink small amounts frequently if you have nausea and vomiting.  Eat as you normally do.  Avoid:  Foods or drinks high in sugar.  Carbonated  drinks.  Juice.  Extremely hot or cold fluids.  Drinks with caffeine.  Fatty, greasy foods.  Alcohol.  Tobacco.  Overeating.  Gelatin desserts.  Wash your hands well to avoid spreading bacteria and viruses.  Only take over-the-counter or prescription medicines for pain, discomfort, or fever as directed by your caregiver.  Ask your caregiver if you should continue all prescribed and over-the-counter medicines.  Keep all follow-up appointments with your caregiver. SEEK MEDICAL CARE IF:  You have abdominal pain and it increases or stays in one area (localizes).  You have a rash, stiff neck, or severe headache.  You are irritable, sleepy, or difficult to awaken.  You are weak, dizzy, or extremely thirsty. SEEK IMMEDIATE MEDICAL CARE IF:   You are unable to keep fluids down or you get worse despite treatment.  You have frequent episodes of vomiting or diarrhea.  You have blood or green matter (bile) in your vomit.  You have blood in your stool or your stool looks black and tarry.  You have not urinated in 6 to 8 hours, or you have only urinated a small amount of very dark urine.  You have a fever.  You faint. MAKE SURE YOU:   Understand these instructions.  Will watch your condition.  Will get help right away if you are not doing well or get worse. Document Released: 03/15/2005 Document Revised: 06/07/2011 Document Reviewed: 11/02/2010 Exit[employer] Patient Information 2015 [employer]. This information is not intended to replace advice given to you by your health care provider. Make sure you discuss any questions you have with your health care   provider.  

## 2014-10-31 NOTE — Progress Notes (Signed)
Per Ned Card, no chemo today.  1L NS only.

## 2014-11-01 ENCOUNTER — Telehealth: Payer: Self-pay | Admitting: Medical Oncology

## 2014-11-01 DIAGNOSIS — C3492 Malignant neoplasm of unspecified part of left bronchus or lung: Secondary | ICD-10-CM

## 2014-11-01 MED ORDER — FERROUS SULFATE 325 (65 FE) MG PO TABS: 325.0000 mg | ORAL_TABLET | Freq: Three times a day (TID) | ORAL | Status: DC

## 2014-11-01 NOTE — Telephone Encounter (Signed)
request refill folic acid adn appt with barb Neff. Call and note to barb and refill sent.

## 2014-11-01 NOTE — Addendum Note (Signed)
Addended by: Ardeen Garland on: 11/01/2014 03:08 PM   Modules accepted: Orders

## 2014-11-04 ENCOUNTER — Telehealth: Payer: Self-pay | Admitting: Medical Oncology

## 2014-11-04 NOTE — Telephone Encounter (Signed)
I called [facility] and informed them that G- tube placement scheduled for wed. And to provide teaching.

## 2014-11-05 ENCOUNTER — Other Ambulatory Visit: Payer: Self-pay | Admitting: Radiology

## 2014-11-06 ENCOUNTER — Other Ambulatory Visit: Payer: Self-pay

## 2014-11-06 ENCOUNTER — Telehealth: Payer: Self-pay | Admitting: *Deleted

## 2014-11-06 ENCOUNTER — Encounter (HOSPITAL_COMMUNITY): Payer: Self-pay

## 2014-11-06 ENCOUNTER — Ambulatory Visit (HOSPITAL_COMMUNITY)
Admission: RE | Admit: 2014-11-06 | Discharge: 2014-11-06 | Disposition: A | Discharge status: Home / Self Care | Payer: Medicare Other | Source: Ambulatory Visit | Attending: Internal Medicine | Admitting: Internal Medicine

## 2014-11-06 ENCOUNTER — Ambulatory Visit (HOSPITAL_COMMUNITY)
Admission: RE | Admit: 2014-11-06 | Discharge: 2014-11-06 | Disposition: A | Discharge status: Home / Self Care | Payer: Medicare Other | Source: Ambulatory Visit | Attending: Nurse Practitioner | Admitting: Nurse Practitioner

## 2014-11-06 DIAGNOSIS — R1900 Intra-abdominal and pelvic swelling, mass and lump, unspecified site: Secondary | ICD-10-CM | POA: Insufficient documentation

## 2014-11-06 DIAGNOSIS — R630 Anorexia: Secondary | ICD-10-CM | POA: Diagnosis not present

## 2014-11-06 DIAGNOSIS — Z7951 Long term (current) use of inhaled steroids: Secondary | ICD-10-CM | POA: Insufficient documentation

## 2014-11-06 DIAGNOSIS — C3490 Malignant neoplasm of unspecified part of unspecified bronchus or lung: Secondary | ICD-10-CM | POA: Diagnosis present

## 2014-11-06 DIAGNOSIS — I10 Essential (primary) hypertension: Secondary | ICD-10-CM | POA: Diagnosis not present

## 2014-11-06 DIAGNOSIS — Z8249 Family history of ischemic heart disease and other diseases of the circulatory system: Secondary | ICD-10-CM | POA: Diagnosis not present

## 2014-11-06 DIAGNOSIS — Z87891 Personal history of nicotine dependence: Secondary | ICD-10-CM | POA: Diagnosis not present

## 2014-11-06 DIAGNOSIS — N400 Enlarged prostate without lower urinary tract symptoms: Secondary | ICD-10-CM | POA: Diagnosis not present

## 2014-11-06 LAB — CBC WITH DIFFERENTIAL/PLATELET
BASOS ABS: 0 10*3/uL (ref 0.0–0.1)
Basophils Relative: 0 % (ref 0–1)
EOS PCT: 0 % (ref 0–5)
Eosinophils Absolute: 0 10*3/uL (ref 0.0–0.7)
HCT: 28.2 % — ABNORMAL LOW (ref 39.0–52.0)
Hemoglobin: 9.4 g/dL — ABNORMAL LOW (ref 13.0–17.0)
Lymphocytes Relative: 5 % — ABNORMAL LOW (ref 12–46)
Lymphs Abs: 0.5 10*3/uL — ABNORMAL LOW (ref 0.7–4.0)
MCH: 32.4 pg (ref 26.0–34.0)
MCHC: 33.3 g/dL (ref 30.0–36.0)
MCV: 97.2 fL (ref 78.0–100.0)
Monocytes Absolute: 0.9 10*3/uL (ref 0.1–1.0)
Monocytes Relative: 10 % (ref 3–12)
NEUTROS PCT: 85 % — AB (ref 43–77)
Neutro Abs: 7.7 10*3/uL (ref 1.7–7.7)
PLATELETS: 204 10*3/uL (ref 150–400)
RBC: 2.9 MIL/uL — ABNORMAL LOW (ref 4.22–5.81)
RDW: 14.6 % (ref 11.5–15.5)
WBC: 9.2 10*3/uL (ref 4.0–10.5)

## 2014-11-06 LAB — BASIC METABOLIC PANEL
ANION GAP: 8 (ref 5–15)
BUN: 15 mg/dL (ref 6–20)
CALCIUM: 9 mg/dL (ref 8.9–10.3)
CO2: 26 mmol/L (ref 22–32)
CREATININE: 0.86 mg/dL (ref 0.61–1.24)
Chloride: 101 mmol/L (ref 101–111)
GFR calc Af Amer: >60 mL/min (ref >60)
GFR calc non Af Amer: >60 mL/min (ref >60)
GLUCOSE: 101 mg/dL — AB (ref 65–99)
Potassium: 4 mmol/L (ref 3.5–5.1)
Sodium: 135 mmol/L (ref 135–145)

## 2014-11-06 LAB — PROTIME-INR
INR: 1.2 (ref 0.00–1.49)
Prothrombin Time: 15.4 seconds — ABNORMAL HIGH (ref 11.6–15.2)

## 2014-11-06 LAB — TROPONIN I

## 2014-11-06 MED ORDER — MIDAZOLAM HCL 2 MG/2ML IJ SOLN
INTRAMUSCULAR | Status: AC | PRN
  Administered 2014-11-06 (×2): 1 mg via INTRAVENOUS
  Administered 2014-11-06 (×2): 0.5 mg via INTRAVENOUS
  Administered 2014-11-06 (×2): 1 mg via INTRAVENOUS

## 2014-11-06 MED ORDER — SODIUM CHLORIDE 0.9 % IJ SOLN
10.0000 mL | Freq: Once | INTRAMUSCULAR | Status: AC
  Administered 2014-11-06: 10 mL

## 2014-11-06 MED ORDER — ONDANSETRON HCL 4 MG/2ML IJ SOLN: 4.0000 mg | INTRAMUSCULAR | Status: DC | PRN

## 2014-11-06 MED ORDER — FENTANYL CITRATE (PF) 100 MCG/2ML IJ SOLN
INTRAMUSCULAR | Status: AC | PRN
  Administered 2014-11-06 (×2): 25 ug via INTRAVENOUS

## 2014-11-06 MED ORDER — HEPARIN SOD (PORK) LOCK FLUSH 100 UNIT/ML IV SOLN
500.0000 [IU] | INTRAVENOUS | Status: AC | PRN
  Administered 2014-11-06: 500 [IU]
  Filled 2014-11-06: qty 5

## 2014-11-06 MED ORDER — FENTANYL CITRATE (PF) 100 MCG/2ML IJ SOLN
INTRAMUSCULAR | Status: AC
  Filled 2014-11-06: qty 4

## 2014-11-06 MED ORDER — SODIUM CHLORIDE 0.9 % IV SOLN
INTRAVENOUS | Status: DC
Start: 2014-11-06 — End: 2014-11-07
  Administered 2014-11-06: 09:00:00 via INTRAVENOUS

## 2014-11-06 MED ORDER — MIDAZOLAM HCL 2 MG/2ML IJ SOLN
INTRAMUSCULAR | Status: AC
  Filled 2014-11-06: qty 6

## 2014-11-06 MED ORDER — GLUCAGON HCL RDNA (DIAGNOSTIC) 1 MG IJ SOLR
INTRAMUSCULAR | Status: AC
  Filled 2014-11-06: qty 1

## 2014-11-06 MED ORDER — IOHEXOL 300 MG/ML  SOLN
10.0000 mL | Freq: Once | INTRAMUSCULAR | Status: DC | PRN
Start: 2014-11-06 — End: 2014-11-07
  Administered 2014-11-06: 10 mL
  Filled 2014-11-06: qty 10

## 2014-11-06 MED ORDER — HYDROMORPHONE HCL 1 MG/ML IJ SOLN
1.0000 mg | INTRAMUSCULAR | Status: DC | PRN
  Administered 2014-11-06: 1 mg via INTRAVENOUS
  Filled 2014-11-06: qty 1

## 2014-11-06 MED ORDER — VANCOMYCIN HCL IN DEXTROSE 1-5 GM/200ML-% IV SOLN
1000.0000 mg | INTRAVENOUS | Status: AC
  Administered 2014-11-06: 1000 mg via INTRAVENOUS
  Filled 2014-11-06: qty 200

## 2014-11-06 MED ORDER — LIDOCAINE HCL 1 % IJ SOLN
INTRAMUSCULAR | Status: AC
  Filled 2014-11-06: qty 20

## 2014-11-06 NOTE — Procedures (Signed)
Interventional Radiology Procedure Note  Procedure: Placement of percutaneous 69F pull-through gastrostomy tube. Complications: None Recommendations:  OK to use tube in 24 hours  - May advance diet as tolerated and begin using tube tomorrow morning  Eulas Post T. Kathlene Cote, M.D Pager:  [phone]

## 2014-11-06 NOTE — Progress Notes (Signed)
Patient ID: [name], male   DOB: [birth year], 69 y.o.   MRN: [id number]    Chief Complaint: Patient was seen in consultation today for percutaneous gastrostomy tube placement   Referring Physician(s): Sherrill,B  History of Present Illness: [name] is a 69 y.o. male who is well known by IR service from prior biliary drain/CBD stenting in May/June of this year.  He has a history of metastatic non-small cell lung cancer with recent evidence for disease progression with large abdominal mass as well as obstructive jaundice status post palliative radiotherapy and he is currently undergoing treatment with immunotherapy with Nivolumab status post 2 cycles. He has had significant weight loss as well as lack of appetite and dysphagia. His by mouth intake is very poor at this point and he presents today for percutaneous gastrostomy tube placement.  Past Medical History  Diagnosis Date  . Hypertension   . Arthritis RIGHT SHOULDER  . Immature cataract BILATERAL  . Non-small cell lung cancer DX SEPT 2010  W/ CHEMORADIATION AT THAT TIME -- NOW  W/ METS--  CURRENTLY ON MAINTENANCE CHEMO TX  EVERY 30 DAYS    ONCOLOGIST- [facility]  . Acute meniscal tear of knee RIGHT KNEE  . BPH (benign prostatic hypertrophy)   . Dizziness and giddiness 04/23/2014  . Tremor 04/23/2014    Right hand  . HOH (hard of hearing)     bilateral hearing aids  . Radiation 01/08/09-01/21/09    Mediastinum 30 Gy x 12 fractions  . Radiation 08/19/14-09/04/14    Palliative RT Porta hepatic LN 30 Gy    Past Surgical History  Procedure Laterality Date  . 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 Rotator cuff repair  2008    LEFT SHOULDER  . Bilateral ear drum surgery  1960'S  . Orif left ring finger  and revascularization of radial side  02-16-2007    DEGLOVING INJURY  . Knee arthroscopy  12/21/2011    Procedure: ARTHROSCOPY KNEE;  Surgeon: Tobi Bastos, MD;  Location: [facility];  Service: Orthopedics;  Laterality: Right;  WITH MEDIAL MENISECTOMY  . Cataract extraction Bilateral   . Tonsillectomy      as child  . Ovarian cyst surgery      removed from left jaw area- benign  . Ovarian cyst surgery       NOTE; pt unaware of why this is in his chart  . Shoulder open rotator cuff repair Right 06/05/2014    Procedure: RIGHT ROTATOR CUFF REPAIR SHOULDER OPEN;  Surgeon: Latanya Maudlin, MD;  Location: [facility];  Service: Orthopedics;  Laterality: Right;  . Ercp N/A 08/09/2014    Procedure: ENDOSCOPIC RETROGRADE CHOLANGIOPANCREATOGRAPHY (ERCP);  Surgeon: Carol Ada, MD;  Location: [facility];  Service: Endoscopy;  Laterality: N/A;  . Eus N/A 08/09/2014    Procedure: UPPER ENDOSCOPIC ULTRASOUND (EUS) RADIAL;  Surgeon: Carol Ada, MD;  Location: [facility];  Service: Endoscopy;  Laterality: N/A;  . Fine needle aspiration N/A 08/09/2014    Procedure: FINE NEEDLE ASPIRATION (FNA) LINEAR;  Surgeon: Carol Ada, MD;  Location: [facility];  Service: Endoscopy;  Laterality: N/A;  . Esophagogastroduodenoscopy (egd) with propofol N/A 08/09/2014    Procedure: ESOPHAGOGASTRODUODENOSCOPY (EGD) WITH PROPOFOL;  Surgeon: Carol Ada, MD;  Location: [facility];  Service: Endoscopy;  Laterality: N/A;    Allergies: Augmentin; Carboplatin; Levaquin; Percocet; and Zolpidem tartrate  Medications: Prior to Admission medications  Medication Sig Start Date End Date Taking? Authorizing Provider  atenolol (TENORMIN) 100 MG tablet Take 100 mg by mouth every morning. Pt taking '50mg'$  once daily   Yes Historical Provider, MD  FeFum-FePoly-FA-B Cmp-C-Biot (INTEGRA PLUS) CAPS Take 1 capsule by mouth every morning. 10/24/14  Yes [name], MD  ferrous sulfate 325 (65 FE) MG tablet Take 1 tablet (325 mg total) by mouth 3 (three) times daily with meals. 11/01/14  Yes [name], MD  Multiple Vitamin (MULTIVITAMIN) tablet Take 1 tablet by  mouth every morning.    Yes Historical Provider, MD  MYRBETRIQ 50 MG TB24 tablet Take 50 mg by mouth every evening.  01/09/14  Yes Historical Provider, MD  omeprazole (PRILOSEC) 40 MG capsule Take 1 capsule (40 mg total) by mouth every evening. Patient taking differently: Take 40 mg by mouth every morning.  11/13/13  Yes [name], MD  ondansetron (ZOFRAN) 8 MG tablet Take 1 tablet (8 mg total) by mouth every 8 (eight) hours as needed for nausea or vomiting. 10/01/14  Yes Susanne Borders, NP  prochlorperazine (COMPAZINE) 10 MG tablet Take 1 tablet (10 mg total) by mouth every 6 (six) hours as needed for nausea or vomiting. 10/15/14  Yes [name], MD  Sennosides-Docusate Sodium (SENOKOT S PO) Take 2 tablets by mouth daily as needed (constipation).    Yes Historical Provider, MD  LORazepam (ATIVAN) 0.5 MG tablet Take 1 tablet (0.5 mg total) by mouth every 6 (six) hours as needed (nausea). Patient not taking: Reported on 11/04/2014 10/01/14   Susanne Borders, NP  triamcinolone (NASACORT ALLERGY 24HR) 55 MCG/ACT AERO nasal inhaler Place 1 spray into the nose daily as needed (congestion.).    Historical Provider, MD     Family History  Problem Relation Age of Onset  . Colon cancer    . Heart attack    . Cancer    . Hypertension    . Hyperlipidemia    . Congestive Heart Failure Mother   . Cancer Sister   . Cancer Maternal Grandfather   . Colon cancer Father   . Heart attack Brother     Social History   Social History  . Marital Status: Married    Spouse Name: N/A  . Number of Children: 3  . Years of Education: college   Occupational History  . ELECTRICIAN     Retired   Social History Main Topics  . Smoking status: Former Smoker -- 2.00 packs/day for 20 years    Types: Cigarettes    Quit date: 03/17/1981  . Smokeless tobacco: Never Used  . Alcohol Use: 3.5 oz/week    7 Standard drinks or equivalent per week     Comment: beer daily  . Drug Use: No  . Sexual Activity: Not  Asked   Other Topics Concern  . None   Social History Narrative   Patient is right handed.   Patient drinks 1-2 cups caffeine daily.      Review of Systems  Constitutional: Positive for appetite change, fatigue and unexpected weight change. Negative for fever.       Occ night sweats  HENT: Positive for trouble swallowing.   Respiratory: Positive for shortness of breath.        Occ cough  Cardiovascular: Positive for chest pain.  Gastrointestinal: Positive for abdominal pain. Negative for nausea, vomiting and blood in stool.  Genitourinary: Negative for dysuria and hematuria.  Musculoskeletal: Negative for back pain.  Neurological: Positive for weakness. Negative for  headaches.    Vital Signs: BP 112/83  HR 89  R 16 TEMP 97.4 O2 SATS 99% RA  Physical Exam  Constitutional: He is oriented to person, place, and time.  Cachetic, weak appearing WM  Cardiovascular: Normal rate and regular rhythm.   Clean, intact right chest wall PAC  Pulmonary/Chest: Effort normal.  Distant BS bilat, dim rt base  Abdominal: Soft. There is tenderness.  Few BS  Musculoskeletal: Normal range of motion. He exhibits no edema.  Neurological: He is alert and oriented to person, place, and time.    Mallampati Score:     Imaging: Dg Esophagus  10/31/2014   CLINICAL DATA:  Dysphagia  EXAM: ESOPHOGRAM/BARIUM SWALLOW  TECHNIQUE: Single contrast examination was performed using  thin barium.  FLUOROSCOPY TIME:  Radiation Exposure Index (as provided by the fluoroscopic device): 36 Gy per sq cm  If the device does not provide the exposure index:  Fluoroscopy Time:  1 minutes 42 seconds  Number of Acquired Images:  COMPARISON:  CT chest of 08/06/2014  FINDINGS: The patient was not able to stand for an adequate length of time, and therefore a lateral series with rapid sequence spot spot films of the cervical esophagus was performed. The swallowing mechanism is unremarkable. No aspiration is seen. There are mild  tertiary contractions in the mid and distal esophagus. No definite hiatal hernia is seen. No gastroesophageal reflux is demonstrated.  IMPRESSION: Mild tertiary contractions in the mid and distal esophagus. No hiatal hernia. No definite reflux.   Electronically Signed   By: Ivar Drape M.D.   On: 10/31/2014 10:39    Labs:  CBC:  Recent Labs  10/04/14 0807 10/18/14 1152 10/31/14 1146 11/06/14 0838  WBC 5.6 13.8* 10.8* 9.2  HGB 10.8* 10.6* 10.2* 9.4*  HCT 33.0* 31.7* 30.6* 28.2*  PLT 198 191 230 204    COAGS:  Recent Labs  06/03/14 1335 08/07/14 0530 08/23/14 1045 09/05/14 1330  INR 1.17 1.04 1.33 1.32  APTT 37  --   --  30    BMP:  Recent Labs  09/20/14 2205 09/21/14 0406 09/23/14 0950 09/24/14 0530  10/01/14 1147 10/04/14 0807 10/18/14 1152 10/31/14 1147  NA 139 135 140 138  < > 137 138 133* 137  K 4.9 3.8 3.1* 3.1*  < > 3.7 3.5 4.7 4.2  CL 107 103 110 107  --   --   --   --   --   CO2 '25 26 24 25  '$ < > 20* 21* 24 24  GLUCOSE 150* 110* 159* 100*  < > 101 162* 137 156*  BUN '12 12 11 10  '$ < > 11.6 11.2 17.8 14.2  CALCIUM 8.8* 8.2* 7.7* 7.9*  < > 9.2 9.5 10.1 9.3  CREATININE 1.28* 1.18 0.90 0.90  < > 0.9 1.1 1.2 0.9  GFRNONAA 55* >60 >60 >60  --   --   --   --   --   GFRAA >60 >60 >60 >60  --   --   --   --   --   < > = values in this interval not displayed.  LIVER FUNCTION TESTS:  Recent Labs  10/01/14 1147 10/04/14 0807 10/18/14 1152 10/31/14 1147  BILITOT 2.96* 2.59* 1.76* 1.05  AST 60* 50* 47* 45*  ALT 62* 58* 62* 70*  ALKPHOS 670* 653* 629* 574*  PROT 6.4 6.7 6.9 6.5  ALBUMIN 2.5* 2.7* 2.8* 2.4*    TUMOR MARKERS: No results for input(s): AFPTM,  CEA, CA199, CHROMGRNA in the last 8760 hours.  Assessment and Plan: [name] is a 69 y.o. male who is well known by IR service from prior biliary drain/CBD stenting in May/June of this year.  He has a history of metastatic non-small cell lung cancer with recent evidence for disease progression  with large abdominal mass as well as obstructive jaundice status post palliative radiotherapy and he is currently undergoing treatment with immunotherapy with Nivolumab status post 2 cycles. He has had significant weight loss as well as lack of appetite and dysphagia. His by mouth intake is very poor at this point and he presents today for percutaneous gastrostomy tube placement.Risks and benefits discussed with the patient/wife including, but not limited to the need for a barium enema during the procedure, bleeding, infection, peritonitis, or damage to adjacent structures.All of the patient's questions were answered, patient is agreeable to proceed.Consent signed and in chart. Pt has been experiencing intermittent bouts of left sided CP near breast region without radiation or associated N/V/diaphoresis- will check troponins and EKG, d/w Dr. Kathlene Cote.     Thank you for this interesting consult.  I greatly enjoyed meeting [name] and look forward to participating in their care.  A copy of this report was sent to the requesting provider on this date.  Signed: D. Rowe Robert 11/06/2014, 9:01 AM   I spent a total of  30 minutes in face to face in clinical consultation, greater than 50% of which was counseling/coordinating care for percutaneous gastrostomy tube placement

## 2014-11-06 NOTE — Discharge Instructions (Signed)
Gastrostomy Tube Home Guide A gastrostomy tube is a tube that is surgically placed into the stomach. It is also called a "G-tube." G-tubes are used when a person is unable to eat and drink enough on their own to stay healthy. The tube is inserted into the stomach through a small cut (incision) in the skin. This tube is used for:  Feeding.  Giving medication. GASTROSTOMY TUBE CARE  Wash your hands with soap and water.  Remove the old dressing (if any). Some styles of G-tubes may need a dressing inserted between the skin and the G-tube. Other types of G-tubes do not require a dressing. Ask your health care provider if a dressing is needed.  Check the area where the tube enters the skin (insertion site) for redness, swelling, or pus-like (purulent) drainage. A small amount of clear or tan liquid drainage is normal. Check to make sure scar tissue (skin) is not growing around the insertion site. This could have a raised, bumpy appearance.  A cotton swab can be used to clean the skin around the tube:  When the G-tube is first put in, a normal saline solution or water can be used to clean the skin.  Mild soap and warm water can be used when the skin around the G-tube site has healed.  Roll the cotton swab around the G-tube insertion site to remove any drainage or crusting at the insertion site. STOMACH RESIDUALS Feeding tube residuals are the amount of liquids that are in the stomach at any given time. Residuals may be checked before giving feedings, medications, or as instructed by your health care provider.  Ask your health care provider if there are instances when you would not start tube feedings depending on the amount or type of contents withdrawn from the stomach.  Check residuals by attaching a syringe to the G-tube and pulling back on the syringe plunger. Note the amount, and return the residual back into the stomach. FLUSHING THE G-TUBE  The G-tube should be periodically flushed with  clean warm water to keep it from clogging.  Flush the G-tube after feedings or medications. Draw up 30 mL of warm water in a syringe. Connect the syringe to the G-tube and slowly push the water into the tube.  Do not push feedings, medications, or flushes rapidly. Flush the G-tube gently and slowly.  Only use syringes made for G-tubes to flush medications or feedings.  Your health care provider may want the G-tube flushed more often or with more water. If this is the case, follow your health care provider's instructions. FEEDINGS Your health care provider will determine whether feedings are given as a bolus (a certain amount given at one time and at scheduled times) or whether feedings will be given continuously on a feeding pump.   Formulas should be given at room temperature.  If feedings are continuous, no more than 4 hours worth of feedings should be placed in the feeding bag. This helps prevent spoilage or accidental excess infusion.  Cover and place unused formula in the refrigerator.  If feedings are continuous, stop the feedings when medications or flushes are given. Be sure to restart the feedings.  Feeding bags and syringes should be replaced as instructed by your health care provider. GIVING MEDICATION   In general, it is best if all medications are in a liquid form for G-tube administration. Liquid medications are less likely to clog the G-tube.  Mix the liquid medication with 30 mL (or amount recommended by your  health care provider) of warm water.  Draw up the medication into the syringe.  Attach the syringe to the G-tube and slowly push the mixture into the G-tube.  After giving the medication, draw up 30 mL of warm water in the syringe and slowly flush the G-tube.  For pills or capsules, check with your health care provider first before crushing medications. Some pills are not effective if they are crushed. Some capsules are sustained-release medications.  If  appropriate, crush the pill or capsule and mix with 30 mL of warm water. Using the syringe, slowly push the medication through the tube, then flush the tube with another 30 mL of tap water. G-TUBE PROBLEMS G-tube was pulled out.  Cause: May have been pulled out accidentally.  Solutions: Cover the opening with clean dressing and tape. Call your health care provider right away. The G-tube should be put in as soon as possible (within 4 hours) so the G-tube opening (tract) does not close. The G-tube needs to be put in at a health care setting. An X-ray needs to be done to confirm placement before the G-tube can be used again. Redness, irritation, soreness, or foul odor around the gastrostomy site.  Cause: May be caused by leakage or infection.  Solutions: Call your health care provider right away. Large amount of leakage of fluid or mucus-like liquid present (a large amount means it soaks clothing).  Cause: Many reasons could cause the G-tube to leak.  Solutions: Call your health care provider to discuss the amount of leakage. Skin or scar tissue appears to be growing where tube enters skin.   Cause: Tissue growth may develop around the insertion site if the G-tube is moved or pulled on excessively.  Solutions: Secure tube with tape so that excess movement does not occur. Call your health care provider. G-tube is clogged.  Cause: Thick formula or medication.  Solutions: Try to slowly push warm water into the tube with a large syringe. Never try to push any object into the tube to unclog it. Do not force fluid into the G-tube. If you are unable to unclog the tube, call your health care provider right away. TIPS  Head of bed (HOB) position refers to the upright position of a person's upper body.  When giving medications or a feeding bolus, keep the [facility] up as told by your health care provider. Do this during the feeding and for 1 hour after the feeding or medication administration.  If  continuous feedings are being given, it is best to keep the [facility] up as told by your health care provider. When ADLs (activities of daily living) are performed and the [facility] needs to be flat, be sure to turn the feeding pump off. Restart the feeding pump when the [facility] is returned to the recommended height.  Do not pull or put tension on the tube.  To prevent fluid backflow, kink the G-tube before removing the cap or disconnecting a syringe.  Check the G-tube length every day. Measure from the insertion site to the end of the G-tube. If the length is longer than previous measurements, the tube may be coming out. Call your health care provider if you notice increasing G-tube length.  Oral care, such as brushing teeth, must be continued.  You may need to remove excess air (vent) from the G-tube. Your health care provider will tell you if this is needed.  Always call your health care provider if you have questions or problems with the G-tube.  SEEK IMMEDIATE MEDICAL CARE IF:   You have severe abdominal pain, tenderness, or abdominal bloating (distension).  You have nausea or vomiting.  You are constipated or have problems moving your bowels.  The G-tube insertion site is red, swollen, has a foul smell, or has yellow or brown drainage.  You have difficulty breathing or shortness of breath.  You have a fever.  You have a large amount of feeding tube residuals.  The G-tube is clogged and cannot be flushed. MAKE SURE YOU:   Understand these instructions.  Will watch your condition.  Will get help right away if you are not doing well or get worse. Document Released: 05/24/2001 Document Revised: 07/30/2013 Document Reviewed: 11/20/2012 [facility] Patient Information 2015 [place], [place]. This information is not intended to replace advice given to you by your health care provider. Make sure you discuss any questions you have with your health care provider. Conscious Sedation Sedation is the use  of medicines to promote relaxation and relieve discomfort and anxiety. Conscious sedation is a type of sedation. Under conscious sedation you are less alert than normal but are still able to respond to instructions or stimulation. Conscious sedation is used during short medical and dental procedures. It is milder than deep sedation or general anesthesia and allows you to return to your regular activities sooner.  LET [facility] CARE PROVIDER KNOW ABOUT:   Any allergies you have.  All medicines you are taking, including vitamins, herbs, eye drops, creams, and over-the-counter medicines.  Use of steroids (by mouth or creams).  Previous problems you or members of your family have had with the use of anesthetics.  Any blood disorders you have.  Previous surgeries you have had.  Medical conditions you have.  Possibility of pregnancy, if this applies.  Use of cigarettes, alcohol, or illegal drugs. RISKS AND COMPLICATIONS Generally, this is a safe procedure. However, as with any procedure, problems can occur. Possible problems include:  Oversedation.  Trouble breathing on your own. You may need to have a breathing tube until you are awake and breathing on your own.  Allergic reaction to any of the medicines used for the procedure. BEFORE THE PROCEDURE  You may have blood tests done. These tests can help show how well your kidneys and liver are working. They can also show how well your blood clots.  A physical exam will be done.  Only take medicines as directed by your health care provider. You may need to stop taking medicines (such as blood thinners, aspirin, or nonsteroidal anti-inflammatory drugs) before the procedure.   Do not eat or drink at least 6 hours before the procedure or as directed by your health care provider.  Arrange for a responsible adult, family member, or friend to take you home after the procedure. He or she should stay with you for at least 24 hours after the  procedure, until the medicine has worn off. PROCEDURE   An intravenous (IV) catheter will be inserted into one of your veins. Medicine will be able to flow directly into your body through this catheter. You may be given medicine through this tube to help prevent pain and help you relax.  The medical or dental procedure will be done. AFTER THE PROCEDURE  You will stay in a recovery area until the medicine has worn off. Your blood pressure and pulse will be checked.   Depending on the procedure you had, you may be allowed to go home when you can tolerate liquids and your  pain is under control. Document Released: 12/08/2000 Document Revised: 03/20/2013 Document Reviewed: 11/20/2012 [facility] Patient Information 2015 [place], [place]. This information is not intended to replace advice given to you by your health care provider. Make sure you discuss any questions you have with your health care provider.

## 2014-11-06 NOTE — Telephone Encounter (Signed)
Oncology Nurse Navigator Documentation  Oncology Nurse Navigator Flowsheets 11/06/2014  Navigator Encounter Type Introductory phone call  Treatment Phase Treatment  Barriers/Navigation Needs Education  Education Other/I had received a phone call from short stay and [facility] dietary regarding patient getting PEG tube placed today.  Barb and short stay would like me to follow up with patient regarding tube.  I called to check on patient.  He asked questions about dressing change and cleaning.  I looked at nursing skills for [place].  I went over dressing change and cleaning.  He verbalized understanding.  I asked that he call if he had any other questions or concerns.   Interventions Education Method  Time Spent with Patient 30

## 2014-11-07 ENCOUNTER — Telehealth: Payer: Self-pay | Admitting: Nutrition

## 2014-11-07 ENCOUNTER — Other Ambulatory Visit: Payer: Self-pay | Admitting: *Deleted

## 2014-11-07 ENCOUNTER — Encounter: Payer: Self-pay | Admitting: Physician Assistant

## 2014-11-07 ENCOUNTER — Ambulatory Visit: Payer: Medicare Other | Admitting: Nutrition

## 2014-11-07 ENCOUNTER — Other Ambulatory Visit (HOSPITAL_BASED_OUTPATIENT_CLINIC_OR_DEPARTMENT_OTHER): Payer: Medicare Other

## 2014-11-07 ENCOUNTER — Ambulatory Visit (HOSPITAL_BASED_OUTPATIENT_CLINIC_OR_DEPARTMENT_OTHER): Payer: Medicare Other

## 2014-11-07 ENCOUNTER — Ambulatory Visit (HOSPITAL_BASED_OUTPATIENT_CLINIC_OR_DEPARTMENT_OTHER): Payer: Medicare Other | Admitting: Physician Assistant

## 2014-11-07 ENCOUNTER — Encounter: Payer: Self-pay | Admitting: *Deleted

## 2014-11-07 VITALS — BP 92/62 | HR 84 | Temp 98.4°F | Resp 16 | Ht 68.0 in | Wt 106.6 lb

## 2014-11-07 DIAGNOSIS — C3490 Malignant neoplasm of unspecified part of unspecified bronchus or lung: Secondary | ICD-10-CM

## 2014-11-07 DIAGNOSIS — R1310 Dysphagia, unspecified: Secondary | ICD-10-CM | POA: Diagnosis not present

## 2014-11-07 DIAGNOSIS — C772 Secondary and unspecified malignant neoplasm of intra-abdominal lymph nodes: Secondary | ICD-10-CM

## 2014-11-07 DIAGNOSIS — R740 Nonspecific elevation of levels of transaminase and lactic acid dehydrogenase [LDH]: Secondary | ICD-10-CM

## 2014-11-07 DIAGNOSIS — C3430 Malignant neoplasm of lower lobe, unspecified bronchus or lung: Secondary | ICD-10-CM

## 2014-11-07 DIAGNOSIS — C3492 Malignant neoplasm of unspecified part of left bronchus or lung: Secondary | ICD-10-CM

## 2014-11-07 DIAGNOSIS — Z5112 Encounter for antineoplastic immunotherapy: Secondary | ICD-10-CM | POA: Diagnosis present

## 2014-11-07 DIAGNOSIS — E440 Moderate protein-calorie malnutrition: Secondary | ICD-10-CM

## 2014-11-07 DIAGNOSIS — C3410 Malignant neoplasm of upper lobe, unspecified bronchus or lung: Secondary | ICD-10-CM

## 2014-11-07 LAB — MAGNESIUM (CC13): MAGNESIUM: 2 mg/dL (ref 1.5–2.5)

## 2014-11-07 MED ORDER — OSMOLITE 1.5 CAL PO LIQD: ORAL | Status: DC

## 2014-11-07 MED ORDER — SODIUM CHLORIDE 0.9 % IV SOLN
3.0000 mg/kg | Freq: Once | INTRAVENOUS | Status: AC
  Administered 2014-11-07: 140 mg via INTRAVENOUS
  Filled 2014-11-07: qty 14

## 2014-11-07 MED ORDER — HEPARIN SOD (PORK) LOCK FLUSH 100 UNIT/ML IV SOLN
500.0000 [IU] | Freq: Once | INTRAVENOUS | Status: AC | PRN
  Administered 2014-11-07: 500 [IU]
  Filled 2014-11-07: qty 5

## 2014-11-07 MED ORDER — SODIUM CHLORIDE 0.9 % IJ SOLN
10.0000 mL | INTRAMUSCULAR | Status: DC | PRN
  Administered 2014-11-07: 10 mL
  Filled 2014-11-07: qty 10

## 2014-11-07 MED ORDER — SODIUM CHLORIDE 0.9 % IV SOLN: Freq: Once | INTRAVENOUS | Status: DC

## 2014-11-07 MED ORDER — OMEPRAZOLE 40 MG PO CPDR: 40.0000 mg | DELAYED_RELEASE_CAPSULE | Freq: Every evening | ORAL | Status: DC

## 2014-11-07 NOTE — Patient Instructions (Signed)
[facility] Discharge Instructions for Patients Receiving Chemotherapy  Today you received the following chemotherapy agents Nivolumab.  To help prevent nausea and vomiting after your treatment, we encourage you to take your nausea medication as prescribed.   If you develop nausea and vomiting that is not controlled by your nausea medication, call the clinic.   BELOW ARE SYMPTOMS THAT SHOULD BE REPORTED IMMEDIATELY:  *FEVER GREATER THAN 100.5 F  *CHILLS WITH OR WITHOUT FEVER  NAUSEA AND VOMITING THAT IS NOT CONTROLLED WITH YOUR NAUSEA MEDICATION  *UNUSUAL SHORTNESS OF BREATH  *UNUSUAL BRUISING OR BLEEDING  TENDERNESS IN MOUTH AND THROAT WITH OR WITHOUT PRESENCE OF ULCERS  *URINARY PROBLEMS  *BOWEL PROBLEMS  UNUSUAL RASH Items with * indicate a potential emergency and should be followed up as soon as possible.  Feel free to call the clinic you have any questions or concerns. The clinic phone number is [phone].  Please show the CHEMO ALERT CARD at check-in to the Emergency Department and triage nurse.   

## 2014-11-07 NOTE — Telephone Encounter (Signed)
Contacted patient by phone to gather some information for appointment this afternoon. Patient reports he prefers to receive continuous feedings nocturnally to allow him to eat throughout the day. Patient eats small amounts of food 3 times a day, consumes one [employer] made with 2% milk once a day, and drinks approximately 4 ounces of water. Reports nausea has improved.  He is not having nausea today. Full assessment to be completed during infusion this afternoon. Will start with Osmolite 1.5.  20 mL an hour over 12 hours. Will increase 10 cc daily until initial goal rate of 60 cc an hour is achieved.   This is approximately 3 cans of Osmolite 1.5 providing approximately 1065 cal, 44.7 g protein, and 543 mL free water.  This is approximately 50% of estimated nutrition needs. Will monitor labs as patient is at risk for refeeding syndrome. I will increase tube feedings to goal rate as tolerated.   Patient requires 6 cans of Osmolite 1.5 to meet greater than 90% of estimated needs.  This is approximately 2130 cal, 89.4 g protein, 1086 mL free water.

## 2014-11-07 NOTE — Progress Notes (Signed)
[place] OFFICE PROGRESS NOTE   DIAGNOSIS AND DIAGNOSIS: Metastatic non-small cell lung cancer adenocarcinoma diagnosed in September 2010   PRIOR THERAPY:  #1 status post 6 cycles of systemic chemotherapy with carboplatin, Alimta and Avastin given every 3 weeks last dose given 04/23/2009 with disease stabilization.  #2 status post palliative radiotherapy to the mediastinum under the care of Dr. Pablo Ledger. The patient received a total dose of 3000 cGY and 12 fractions completed 01/21/2009.  #3 Maintenance chemotherapy with Alimta at 500 mg per meter squared and Avastin at 15 mg per kilogram given every 3 weeks status post 35 cycles.  #4 Maintenance chemotherapy with Alimta 500 mg/M2 and Avastin 15 mg/kg every 4 weeks,status post 17 cycles, discontinued secondary to disease progression.  #5 Systemic chemotherapy with carboplatin for AUC of 5, Alimta 500 mg/M2 and Avastin 15 mg/kg every 3 weeks, status post 3 cycles, last cycle was given 12/28/2012. Carboplatin was discontinued starting cycle #2 secondary to hypersensitivity reaction. #6 Maintenance chemotherapy again with Alimta 500 mg/M2 and Avastin 15 mg/kg every 3 weeks, first cycle 01/23/2013. Status post 6 cycles. #7 palliative radiotherapy to the large abdominal mass under the care of Dr. Pablo Ledger.  CURRENT THERAPY: Immunotherapy with Nivolumab 3 MG/KG every 2 weeks. First dose 10/04/2014. Status post 2 cycles     INTERVAL HISTORY:   Mr. [name] returns as scheduled. He completed cycle 2 nivolumab on 10/18/2014. He reports stable baseline nausea. No rash. No diarrhea. He has stable dyspnea. No fever or pain. He continues to feel weak and tired. He feels that this is related to his immunotherapy. He continues to have difficulty swallowing particularly "dry" foods like bread or cereal. He is tolerating liquids.He is status post PEG tube placement on 11/06/2014. He is to meet with the nutritionist during his  treatment today in the infusion area regarding tube feedings via the PEG tube. He is able to swallow his medications. He requests a refill for his omeprazole.  He recently gained a proximally 3 pounds.  Objective:  Vital signs in last 24 hours:  Blood pressure 92/62, pulse 84, temperature 98.4 F (36.9 C), temperature source Oral, resp. rate 16, height _0  (1.727 m), weight 106 lb 9.6 oz (48.353 kg), SpO2 97 %.    HEENT: No thrush or ulcers. Resp: Lungs clear bilaterally. Cardio: Regular rate and rhythm. GI: Abdomen soft and nontender. Vascular: No leg edema. Port-A-Cath without erythema.  Lab Results:  Lab Results  Component Value Date   WBC 9.2 11/06/2014   HGB 9.4* 11/06/2014   HCT 28.2* 11/06/2014   MCV 97.2 11/06/2014   PLT 204 11/06/2014   NEUTROABS 7.7 11/06/2014    Imaging:  Ir Gastrostomy Tube  11/06/2014   CLINICAL DATA:  Metastatic lung carcinoma, weight loss, loss of appetite, dysphagia and poor nutrition. The patient presents for percutaneous gastrostomy tube placement.  EXAM: PERCUTANEOUS GASTROSTOMY TUBE PLACEMENT  ANESTHESIA/SEDATION: 5.0 mg IV Versed; 50 mcg IV Fentanyl.  Total Moderate Sedation Time  30 minutes.  CONTRAST:  56m OMNIPAQUE IOHEXOL 300 MG/ML  SOLN  MEDICATIONS: 1 g IV vancomycin. Vancomycin was given within two hours of incision. Vancomycin was given due to an antibiotic allergy.  FLUOROSCOPY TIME:  11 minutes.  PROCEDURE: The procedure, risks, benefits, and alternatives were explained to the patient. Questions regarding the procedure were encouraged and answered. The patient understands and consents to the procedure.  A time-out was performed prior to the procedure. A 5-French catheter was then advanced through the the  patient's mouth under fluoroscopy into the esophagus and to the level of the stomach. This catheter was used to insufflate the stomach with air under fluoroscopy.  The abdominal wall was prepped with Betadine in a sterile fashion, and  a sterile drape was applied covering the operative field. A sterile gown and sterile gloves were used for the procedure. Local anesthesia was provided with 1% Lidocaine.  A skin incision was made in the upper abdominal wall. Under fluoroscopy, an 18 gauge trocar needle was advanced into the stomach. Contrast injection was performed to confirm intraluminal position of the needle tip. A single T tack was then deployed in the lumen of the stomach. This was brought up to tension at the skin surface.  Over a guidewire, a 9-French sheath was advanced into the lumen of the stomach. The wire was left in place as a safety wire. A loop snare device from a percutaneous gastrostomy kit was then advanced into the stomach.  A floppy guide wire was advanced through the orogastric catheter under fluoroscopy in the stomach. The loop snare advanced through the percutaneous gastric access was used to snare the guide wire. This allowed withdrawal of the loop snare out of the patient's mouth by retraction of the orogastric catheter and wire.  A 20-French bumper retention gastrostomy tube was looped around the snare device. It was then pulled back through the patient's mouth. The retention bumper was brought up to the anterior gastric wall. The T tack suture was cut at the skin. The exiting gastrostomy tube was cut to appropriate length and a feeding adapter applied. The catheter was injected with contrast material to confirm position and a fluoroscopic spot image saved. The tube was then flushed with saline. A dressing was applied over the gastrostomy exit site.  COMPLICATIONS: None.  FINDINGS: The stomach distended well with air allowing safe placement of the gastrostomy tube. After placement, the tip of the gastrostomy tube lies in the body of the stomach.  IMPRESSION: Percutaneous gastrostomy with placement of a 20-French bumper retention tube in the body of the stomach. This tube can be used for percutaneous feeds beginning in 24  hours after placement.   Electronically Signed   By: Aletta Edouard M.D.   On: 11/06/2014 13:01    Medications: I have reviewed the patient's current medications.  Assessment/Plan:  1. Metastatic non-small cell lung cancer currently on active treatment with nivolumab. He has completed 2 cycles. 2. Anorexia/weight loss, dysphagia.  Disposition: Mr. [name] has a poor performance status. He has completed 2 cycles of nivolumab. He is experiencing anorexia/weight loss and dysphagia. He is now status post PEG tube placement and will meet with our nutritionist regarding specific tube feedings later today. Patient was discussed with and also seen by Dr. [name]. He'll proceed with cycle #3 of his immunotherapy with nivolumab. He will follow-up in 2 weeks prior to the start of cycle #4, after which she will be due for a restaging CT scan.   Carlton Adam , PA-C   11/07/2014  5:31 PM  ADDENDUM: Hematology/Oncology Attending: I had a face to face encounter with the patient. I recommended his care plan. This is a very pleasant 69 years old white male with metastatic non-small cell lung cancer, adenocarcinoma who is currently on treatment with Nivolumab status post 2 cycles. He is tolerating his treatment with Nivolumab fairly well with no significant adverse effects except for the persistent fatigue. He has lack of appetite and severe protein calorie malnutrition. He had  a PEG tube placed recently by interventional radiology and he is evaluated by the dietitian at the [place] for supplemental nutrition. I recommended for the patient to proceed with his treatment today as a scheduled. He would come back for follow-up visit in 2 weeks for reevaluation before starting cycle #4. He was advised to call immediately if he has any concerning symptoms in the interval.  Disclaimer: This note was dictated with voice recognition software. Similar sounding words can inadvertently be transcribed and may  not be corrected upon review. Eilleen Kempf., MD 11/09/2014

## 2014-11-07 NOTE — Progress Notes (Signed)
Patient is a 69 year old male diagnosed with lung cancer.  He is a patient of Dr. [name].  Past medical history includes hypertension, arthritis, radiation therapy and hard of hearing.  Medications include Ativan, ferrous sulfate, multivitamin, Prilosec, Zofran, Compazine, and Senokot-S.  Labs include: Magnesium 2.0 on August 11.  CMET and phosphorus pending.  Height: 68 inches. Weight: 106.6 pounds August 11. Usual body weight: 133 pounds May 2016. BMI: 16.21 (underweight)  Estimated nutrition needs: 2000-2200 calories, 95-105 g protein, 2.2 L fluid.  Patient is status post PEG placement Wednesday, August 10. Patient reports he consumes 3 very small meals daily that are "fist" sized. (This is approximately 500-600 cal based on dietary recall) He consumes one Carnation breakfast essentials mixed with 2% milk a day. He reports drinking 4-5 ounces of water daily. Patient has been unable to eat or drink the past several days. Patient is at risk for refeeding syndrome.  Tube feeding to advance slowly as tolerated.  Nutrition diagnosis:  Severe Malnutrition related to inadequate oral intake as evidenced by 22% weight loss over 3 months and less than 75% energy intake for greater than one month.  Intervention: Patient and wife educated on beginning continuous tube feedings utilizing Osmolite 1.5. Patient will begin tube feeding at 20 ml/hr and run for 12 hours overnight from approximately 8 pm to 8 am. Recommend advance 10 cc daily until patient achieves goal rate of 95 cc/hr for 15 hours to provide 2130 cal, 89.4 g protein, 1034m free water. This provides greater than 90% of estimated nutrition needs. In addition patient will flush with 240 cc free water 6 times a day providing an additional 1440 mL free water. Written instructions were provided.  Orders were written. Questions were answered.  Teach back method was used. Patient and wife were given fact sheet explaining tube feeding  advancement. Have ordered CMET, magnesium and phosphorus on Monday and Thursday next week.  Monitoring, evaluation, goals: Patient will tolerate tube feeding to minimize further weight loss and promote repletion while avoiding refeeding syndrome.  Next visit: Monday, August 15.  **Disclaimer: This note was dictated with voice recognition software. Similar sounding words can inadvertently be transcribed and this note may contain transcription errors which may not have been corrected upon publication of note.**

## 2014-11-07 NOTE — Progress Notes (Signed)
Oncology Nurse Navigator Documentation  Oncology Nurse Navigator Flowsheets 11/07/2014  Navigator Encounter Type -  Treatment Phase Treatment/I received a phone call from [facility].  She requested phos and mag to be drawn this week and on 8/25.  I asked Dr. [name].  He stated that would be ok.  I ordered and spoke with lab about lab draw.    Barriers/Navigation Needs Education/Coordination of care  Education -  Interventions Organized labs   Time Spent with Patient 30

## 2014-11-08 ENCOUNTER — Telehealth: Payer: Self-pay | Admitting: Nutrition

## 2014-11-08 ENCOUNTER — Telehealth: Payer: Self-pay | Admitting: *Deleted

## 2014-11-08 LAB — PHOSPHORUS: PHOSPHORUS: 3.1 mg/dL (ref 2.1–4.3)

## 2014-11-08 NOTE — Patient Instructions (Signed)
Follow up in 2 weeks, prior to your next scheduled cycle of immunotherapy Proceed with tube feedings via the PEG tube as directed

## 2014-11-08 NOTE — Telephone Encounter (Signed)
Received a phone call from patient's wife requesting that I call them.   Patient and wife refused delivery of feeding pump last evening and asked them to bring the pump this morning.  Hence patient did not receive continuous tube feeding last evening Wife reports patient ate dinner last night consisting of meat, potatoes, vegetable, and a glass of milk. Patient woke up around 4:00 this morning with vomiting. Wife concerned that this is from a 4 ounces bolus of tube feeding yesterday around noon. Contacted patient this morning who reports vomiting has resolved. He is drinking Carnation instant breakfast. If no more vomiting, recommended patient begin continuous tube feedings this evening as instructed utilizing a 20 cc hourly rate over 12 hours. Patient and wife understand patient should discontinue continuous feedings if vomiting occurs. Follow-up scheduled on Monday.

## 2014-11-08 NOTE — Telephone Encounter (Signed)
Per staff message and POF I have scheduled appts. Advised scheduler of appts. JMW  

## 2014-11-11 ENCOUNTER — Encounter: Payer: Medicare Other | Admitting: Medical Oncology

## 2014-11-11 ENCOUNTER — Telehealth: Payer: Self-pay | Admitting: Nutrition

## 2014-11-11 ENCOUNTER — Other Ambulatory Visit: Payer: Self-pay | Admitting: Medical Oncology

## 2014-11-11 ENCOUNTER — Encounter: Payer: Medicare Other | Admitting: Nutrition

## 2014-11-11 NOTE — Telephone Encounter (Signed)
Patient's wife called and said patient took too much dilaudid on Saturday and she had to call EMS.  Patient refused to go to the hospital.   Wife gave patient 60 cc of bolus TF 3 times yesterday and stated she has given all the water flushes as directed.  Patient received 2 cans of Osmolite 1.5 overnight via continuous feeding with no problem.   Wife stated patient cannot get out of bed today.  He is too weak.  He rates pain as a 3 at site of G-tube.  Wife does not think patient can make nutrition appointment today.  Wife wondering what to do for feedings. Patient appears to be tolerating TF at slow rate. Advised wife to contact MD to update on patient's condition. Recommended wife allow patient to receive Osmolite 1.5 at 40 ml/hr continuously as tolerated-no advancement. She should continue free water flushes as directed. Will continue to work with patient and follow up by phone.

## 2014-11-12 ENCOUNTER — Telehealth: Payer: Self-pay | Admitting: Nutrition

## 2014-11-12 NOTE — Progress Notes (Signed)
Opened in error

## 2014-11-12 NOTE — Telephone Encounter (Signed)
Patient's wife contacted me this morning via telephone. She reports patient had diarrhea this morning. States patient tolerated 3.5 cans of Osmolite 1.5 yesterday via feeding tube. Wife would like information on what to run feeding tube rate at today. Attempted to call patient back on telephone and mobile but received no answer. I left a message for them to contact me by phone.

## 2014-11-14 ENCOUNTER — Other Ambulatory Visit (HOSPITAL_BASED_OUTPATIENT_CLINIC_OR_DEPARTMENT_OTHER): Payer: Medicare Other

## 2014-11-14 ENCOUNTER — Ambulatory Visit (HOSPITAL_BASED_OUTPATIENT_CLINIC_OR_DEPARTMENT_OTHER): Payer: Medicare Other

## 2014-11-14 ENCOUNTER — Other Ambulatory Visit: Payer: Medicare Other

## 2014-11-14 ENCOUNTER — Ambulatory Visit (HOSPITAL_BASED_OUTPATIENT_CLINIC_OR_DEPARTMENT_OTHER): Payer: Medicare Other | Admitting: Nurse Practitioner

## 2014-11-14 ENCOUNTER — Ambulatory Visit: Payer: Medicare Other | Admitting: Nutrition

## 2014-11-14 ENCOUNTER — Telehealth: Payer: Self-pay | Admitting: *Deleted

## 2014-11-14 ENCOUNTER — Ambulatory Visit: Payer: Medicare Other | Admitting: Internal Medicine

## 2014-11-14 ENCOUNTER — Ambulatory Visit: Payer: Medicare Other

## 2014-11-14 VITALS — BP 103/70 | HR 99 | Temp 97.8°F | Resp 18

## 2014-11-14 VITALS — BP 94/64 | HR 116 | Temp 97.7°F | Resp 16 | Wt 101.5 lb

## 2014-11-14 DIAGNOSIS — C3492 Malignant neoplasm of unspecified part of left bronchus or lung: Secondary | ICD-10-CM

## 2014-11-14 DIAGNOSIS — R740 Nonspecific elevation of levels of transaminase and lactic acid dehydrogenase [LDH]: Secondary | ICD-10-CM

## 2014-11-14 DIAGNOSIS — R630 Anorexia: Secondary | ICD-10-CM | POA: Diagnosis not present

## 2014-11-14 DIAGNOSIS — R112 Nausea with vomiting, unspecified: Secondary | ICD-10-CM | POA: Diagnosis present

## 2014-11-14 DIAGNOSIS — I959 Hypotension, unspecified: Secondary | ICD-10-CM | POA: Diagnosis not present

## 2014-11-14 DIAGNOSIS — E8809 Other disorders of plasma-protein metabolism, not elsewhere classified: Secondary | ICD-10-CM

## 2014-11-14 DIAGNOSIS — E860 Dehydration: Secondary | ICD-10-CM

## 2014-11-14 DIAGNOSIS — Z452 Encounter for adjustment and management of vascular access device: Secondary | ICD-10-CM | POA: Diagnosis not present

## 2014-11-14 DIAGNOSIS — R634 Abnormal weight loss: Secondary | ICD-10-CM

## 2014-11-14 DIAGNOSIS — R7401 Elevation of levels of liver transaminase levels: Secondary | ICD-10-CM

## 2014-11-14 DIAGNOSIS — Z95828 Presence of other vascular implants and grafts: Secondary | ICD-10-CM

## 2014-11-14 LAB — CBC WITH DIFFERENTIAL/PLATELET
BASO%: 0.2 % (ref 0.0–2.0)
Basophils Absolute: 0 10*3/uL (ref 0.0–0.1)
EOS%: 0.3 % (ref 0.0–7.0)
Eosinophils Absolute: 0 10*3/uL (ref 0.0–0.5)
HCT: 26.3 % — ABNORMAL LOW (ref 38.4–49.9)
HEMOGLOBIN: 8.6 g/dL — AB (ref 13.0–17.1)
LYMPH%: 4.6 % — AB (ref 14.0–49.0)
MCH: 31.5 pg (ref 27.2–33.4)
MCHC: 32.7 g/dL (ref 32.0–36.0)
MCV: 96.2 fL (ref 79.3–98.0)
MONO#: 1 10*3/uL — AB (ref 0.1–0.9)
MONO%: 7.7 % (ref 0.0–14.0)
NEUT%: 87.2 % — ABNORMAL HIGH (ref 39.0–75.0)
NEUTROS ABS: 11.5 10*3/uL — AB (ref 1.5–6.5)
PLATELETS: 308 10*3/uL (ref 140–400)
RBC: 2.73 10*6/uL — AB (ref 4.20–5.82)
RDW: 15.2 % — AB (ref 11.0–14.6)
WBC: 13.1 10*3/uL — AB (ref 4.0–10.3)
lymph#: 0.6 10*3/uL — ABNORMAL LOW (ref 0.9–3.3)

## 2014-11-14 LAB — COMPREHENSIVE METABOLIC PANEL (CC13)
ALT: 66 U/L — AB (ref 0–55)
ANION GAP: 11 meq/L (ref 3–11)
AST: 41 U/L — ABNORMAL HIGH (ref 5–34)
Albumin: 2.3 g/dL — ABNORMAL LOW (ref 3.5–5.0)
Alkaline Phosphatase: 555 U/L — ABNORMAL HIGH (ref 40–150)
BUN: 19 mg/dL (ref 7.0–26.0)
CALCIUM: 9.4 mg/dL (ref 8.4–10.4)
CHLORIDE: 102 meq/L (ref 98–109)
CO2: 23 mEq/L (ref 22–29)
CREATININE: 1 mg/dL (ref 0.7–1.3)
EGFR: 74 mL/min/{1.73_m2} — AB (ref >90)
Glucose: 181 mg/dl — ABNORMAL HIGH (ref 70–140)
Potassium: 4.2 mEq/L (ref 3.5–5.1)
Sodium: 135 mEq/L — ABNORMAL LOW (ref 136–145)
Total Bilirubin: 0.58 mg/dL (ref 0.20–1.20)
Total Protein: 6.6 g/dL (ref 6.4–8.3)

## 2014-11-14 LAB — MAGNESIUM (CC13): MAGNESIUM: 2 mg/dL (ref 1.5–2.5)

## 2014-11-14 LAB — PHOSPHORUS: PHOSPHORUS: 1.8 mg/dL — AB (ref 2.1–4.3)

## 2014-11-14 MED ORDER — SODIUM CHLORIDE 0.9 % IJ SOLN
10.0000 mL | INTRAMUSCULAR | Status: DC | PRN
  Administered 2014-11-14: 10 mL via INTRAVENOUS
  Filled 2014-11-14: qty 10

## 2014-11-14 MED ORDER — HEPARIN SOD (PORK) LOCK FLUSH 100 UNIT/ML IV SOLN
500.0000 [IU] | Freq: Once | INTRAVENOUS | Status: DC
  Filled 2014-11-14: qty 5

## 2014-11-14 MED ORDER — HEPARIN SOD (PORK) LOCK FLUSH 100 UNIT/ML IV SOLN
500.0000 [IU] | Freq: Once | INTRAVENOUS | Status: AC
  Administered 2014-11-14: 500 [IU] via INTRAVENOUS
  Filled 2014-11-14: qty 5

## 2014-11-14 MED ORDER — SODIUM CHLORIDE 0.9 % IV SOLN
INTRAVENOUS | Status: AC
Start: 2014-11-14 — End: 2014-11-14
  Administered 2014-11-14: 14:00:00 via INTRAVENOUS

## 2014-11-14 NOTE — Progress Notes (Signed)
Nutrition follow-up completed with patient diagnosed with lung cancer. Patient is status post PEG placement Wednesday, August 10.  He reports pain at PEG site anywhere between 0 and 5. Patient has continued to try to eat and drink what he feels he can.  He reports nausea with oral intake. He has tolerated between 2 and 3 cans of Osmolite 1.5 continuous tube feeding with free water flushes between meals and feedings. Labs were reviewed from August 11. Labs from August 18 include sodium 135, glucose 181, potassium 4.2, and magnesium 2.0.  Phosphorus lab is pending. Weight decreased and documented as 101.5 pounds August 18, down from 106.6 pounds August 11. Patient reports diarrhea resolved since DC Senokot.  Nutrition diagnosis: Severe malnutrition continues.  Intervention:  Recommended patient attempt Osmolite 1.5 at 30 cc an hour via PEG continuously as tolerated with no advancement. Discouraged increased oral intake until tube feeding tolerance established. Recommended patient decreased free water flushes to 120 cc at a time. Teach back method was used.  Monitoring, evaluation, goals: Patient will tolerate continuous tube feedings to provide increase quality-of-life.  Next visit: Friday, August 26 during treatment.  **Disclaimer: This note was dictated with voice recognition software. Similar sounding words can inadvertently be transcribed and this note may contain transcription errors which may not have been corrected upon publication of note.**

## 2014-11-14 NOTE — Patient Instructions (Signed)
Dehydration, Adult Dehydration is when you lose more fluids from the body than you take in. Vital organs like the kidneys, brain, and heart cannot function without a proper amount of fluids and salt. Any loss of fluids from the body can cause dehydration.  CAUSES   Vomiting.  Diarrhea.  Excessive sweating.  Excessive urine output.  Fever. SYMPTOMS  Mild dehydration  Thirst.  Dry lips.  Slightly dry mouth. Moderate dehydration  Very dry mouth.  Sunken eyes.  Skin does not bounce back quickly when lightly pinched and released.  Dark urine and decreased urine production.  Decreased tear production.  Headache. Severe dehydration  Very dry mouth.  Extreme thirst.  Rapid, weak pulse (more than 100 beats per minute at rest).  Cold hands and feet.  Not able to sweat in spite of heat and temperature.  Rapid breathing.  Blue lips.  Confusion and lethargy.  Difficulty being awakened.  Minimal urine production.  No tears. DIAGNOSIS  Your caregiver will diagnose dehydration based on your symptoms and your exam. Blood and urine tests will help confirm the diagnosis. The diagnostic evaluation should also identify the cause of dehydration. TREATMENT  Treatment of mild or moderate dehydration can often be done at home by increasing the amount of fluids that you drink. It is best to drink small amounts of fluid more often. Drinking too much at one time can make vomiting worse. Refer to the home care instructions below. Severe dehydration needs to be treated at the hospital where you will probably be given intravenous (IV) fluids that contain water and electrolytes. HOME CARE INSTRUCTIONS   Ask your caregiver about specific rehydration instructions.  Drink enough fluids to keep your urine clear or pale yellow.  Drink small amounts frequently if you have nausea and vomiting.  Eat as you normally do.  Avoid:  Foods or drinks high in sugar.  Carbonated  drinks.  Juice.  Extremely hot or cold fluids.  Drinks with caffeine.  Fatty, greasy foods.  Alcohol.  Tobacco.  Overeating.  Gelatin desserts.  Wash your hands well to avoid spreading bacteria and viruses.  Only take over-the-counter or prescription medicines for pain, discomfort, or fever as directed by your caregiver.  Ask your caregiver if you should continue all prescribed and over-the-counter medicines.  Keep all follow-up appointments with your caregiver. SEEK MEDICAL CARE IF:  You have abdominal pain and it increases or stays in one area (localizes).  You have a rash, stiff neck, or severe headache.  You are irritable, sleepy, or difficult to awaken.  You are weak, dizzy, or extremely thirsty. SEEK IMMEDIATE MEDICAL CARE IF:   You are unable to keep fluids down or you get worse despite treatment.  You have frequent episodes of vomiting or diarrhea.  You have blood or green matter (bile) in your vomit.  You have blood in your stool or your stool looks black and tarry.  You have not urinated in 6 to 8 hours, or you have only urinated a small amount of very dark urine.  You have a fever.  You faint. MAKE SURE YOU:   Understand these instructions.  Will watch your condition.  Will get help right away if you are not doing well or get worse. Document Released: 03/15/2005 Document Revised: 06/07/2011 Document Reviewed: 11/02/2010 Exit[employer] Patient Information 2015 [employer]. This information is not intended to replace advice given to you by your health care provider. Make sure you discuss any questions you have with your health care   provider.  

## 2014-11-14 NOTE — Patient Instructions (Signed)

## 2014-11-14 NOTE — Telephone Encounter (Signed)
Patient wife called in stating that patient is not eating/drinking and feeling very fatigued after nivolumab treatment. Patient currently has g-tube and is not giving himself tube feeding adequately. Wife would like patient to be seen asap concerning current health. POF sent to scheduler for office visit with Selena Lesser NP.

## 2014-11-15 ENCOUNTER — Other Ambulatory Visit: Payer: Self-pay | Admitting: Nurse Practitioner

## 2014-11-15 ENCOUNTER — Ambulatory Visit (HOSPITAL_BASED_OUTPATIENT_CLINIC_OR_DEPARTMENT_OTHER): Payer: Medicare Other

## 2014-11-15 ENCOUNTER — Telehealth: Payer: Self-pay | Admitting: *Deleted

## 2014-11-15 VITALS — BP 116/74 | HR 114 | Temp 96.8°F | Resp 18

## 2014-11-15 DIAGNOSIS — R634 Abnormal weight loss: Secondary | ICD-10-CM | POA: Diagnosis not present

## 2014-11-15 DIAGNOSIS — R630 Anorexia: Secondary | ICD-10-CM

## 2014-11-15 DIAGNOSIS — C3411 Malignant neoplasm of upper lobe, right bronchus or lung: Secondary | ICD-10-CM | POA: Diagnosis not present

## 2014-11-15 DIAGNOSIS — R1310 Dysphagia, unspecified: Secondary | ICD-10-CM | POA: Diagnosis present

## 2014-11-15 DIAGNOSIS — E860 Dehydration: Secondary | ICD-10-CM

## 2014-11-15 MED ORDER — HEPARIN SOD (PORK) LOCK FLUSH 100 UNIT/ML IV SOLN
500.0000 [IU] | Freq: Once | INTRAVENOUS | Status: AC
  Administered 2014-11-15: 500 [IU] via INTRAVENOUS
  Filled 2014-11-15: qty 5

## 2014-11-15 MED ORDER — SODIUM CHLORIDE 0.9 % IV SOLN
INTRAVENOUS | Status: AC
  Administered 2014-11-15: 14:00:00 via INTRAVENOUS

## 2014-11-15 MED ORDER — OLANZAPINE 10 MG PO TABS: 10.0000 mg | ORAL_TABLET | Freq: Every day | ORAL | Status: DC

## 2014-11-15 MED ORDER — SODIUM CHLORIDE 0.9 % IJ SOLN
10.0000 mL | INTRAMUSCULAR | Status: DC | PRN
  Administered 2014-11-15: 10 mL via INTRAVENOUS
  Filled 2014-11-15: qty 10

## 2014-11-15 MED ORDER — METOCLOPRAMIDE HCL 10 MG PO TABS: 10.0000 mg | ORAL_TABLET | Freq: Three times a day (TID) | ORAL | Status: DC | PRN

## 2014-11-15 NOTE — Progress Notes (Signed)
Spoke to pt wife- they confirm appt 11/15/14 for IVF at 1330.

## 2014-11-15 NOTE — Telephone Encounter (Signed)
Per [employer] called to [place] , RN for 1 liter IV normal saline 2-3 times a week prn.

## 2014-11-15 NOTE — Telephone Encounter (Signed)
THE ORDERS FOR IV FLUIDS AT HOME NEED TO BE RENEWED.

## 2014-11-15 NOTE — Patient Instructions (Signed)
Dehydration, Adult Dehydration is when you lose more fluids from the body than you take in. Vital organs like the kidneys, brain, and heart cannot function without a proper amount of fluids and salt. Any loss of fluids from the body can cause dehydration.  CAUSES   Vomiting.  Diarrhea.  Excessive sweating.  Excessive urine output.  Fever. SYMPTOMS  Mild dehydration  Thirst.  Dry lips.  Slightly dry mouth. Moderate dehydration  Very dry mouth.  Sunken eyes.  Skin does not bounce back quickly when lightly pinched and released.  Dark urine and decreased urine production.  Decreased tear production.  Headache. Severe dehydration  Very dry mouth.  Extreme thirst.  Rapid, weak pulse (more than 100 beats per minute at rest).  Cold hands and feet.  Not able to sweat in spite of heat and temperature.  Rapid breathing.  Blue lips.  Confusion and lethargy.  Difficulty being awakened.  Minimal urine production.  No tears. DIAGNOSIS  Your caregiver will diagnose dehydration based on your symptoms and your exam. Blood and urine tests will help confirm the diagnosis. The diagnostic evaluation should also identify the cause of dehydration. TREATMENT  Treatment of mild or moderate dehydration can often be done at home by increasing the amount of fluids that you drink. It is best to drink small amounts of fluid more often. Drinking too much at one time can make vomiting worse. Refer to the home care instructions below. Severe dehydration needs to be treated at the hospital where you will probably be given intravenous (IV) fluids that contain water and electrolytes. HOME CARE INSTRUCTIONS   Ask your caregiver about specific rehydration instructions.  Drink enough fluids to keep your urine clear or pale yellow.  Drink small amounts frequently if you have nausea and vomiting.  Eat as you normally do.  Avoid:  Foods or drinks high in sugar.  Carbonated  drinks.  Juice.  Extremely hot or cold fluids.  Drinks with caffeine.  Fatty, greasy foods.  Alcohol.  Tobacco.  Overeating.  Gelatin desserts.  Wash your hands well to avoid spreading bacteria and viruses.  Only take over-the-counter or prescription medicines for pain, discomfort, or fever as directed by your caregiver.  Ask your caregiver if you should continue all prescribed and over-the-counter medicines.  Keep all follow-up appointments with your caregiver. SEEK MEDICAL CARE IF:  You have abdominal pain and it increases or stays in one area (localizes).  You have a rash, stiff neck, or severe headache.  You are irritable, sleepy, or difficult to awaken.  You are weak, dizzy, or extremely thirsty. SEEK IMMEDIATE MEDICAL CARE IF:   You are unable to keep fluids down or you get worse despite treatment.  You have frequent episodes of vomiting or diarrhea.  You have blood or green matter (bile) in your vomit.  You have blood in your stool or your stool looks black and tarry.  You have not urinated in 6 to 8 hours, or you have only urinated a small amount of very dark urine.  You have a fever.  You faint. MAKE SURE YOU:   Understand these instructions.  Will watch your condition.  Will get help right away if you are not doing well or get worse. Document Released: 03/15/2005 Document Revised: 06/07/2011 Document Reviewed: 11/02/2010 Exit[employer] Patient Information 2015 [employer]. This information is not intended to replace advice given to you by your health care provider. Make sure you discuss any questions you have with your health care   provider.  

## 2014-11-18 ENCOUNTER — Encounter: Payer: Self-pay | Admitting: Nurse Practitioner

## 2014-11-18 ENCOUNTER — Inpatient Hospital Stay (HOSPITAL_COMMUNITY)
Admission: EM | Admit: 2014-11-18 | Discharge: 2014-11-22 | DRG: 871 | Disposition: A | Discharge status: Home Health | Payer: Medicare Other | Attending: Internal Medicine | Admitting: Internal Medicine

## 2014-11-18 ENCOUNTER — Other Ambulatory Visit: Payer: Self-pay | Admitting: *Deleted

## 2014-11-18 ENCOUNTER — Other Ambulatory Visit: Payer: Self-pay

## 2014-11-18 ENCOUNTER — Ambulatory Visit (HOSPITAL_BASED_OUTPATIENT_CLINIC_OR_DEPARTMENT_OTHER): Payer: Medicare Other

## 2014-11-18 ENCOUNTER — Emergency Department (HOSPITAL_COMMUNITY): Payer: Medicare Other

## 2014-11-18 ENCOUNTER — Encounter (HOSPITAL_COMMUNITY): Payer: Self-pay

## 2014-11-18 ENCOUNTER — Telehealth: Payer: Self-pay | Admitting: *Deleted

## 2014-11-18 ENCOUNTER — Ambulatory Visit (HOSPITAL_BASED_OUTPATIENT_CLINIC_OR_DEPARTMENT_OTHER): Payer: Medicare Other | Admitting: Nurse Practitioner

## 2014-11-18 ENCOUNTER — Ambulatory Visit: Payer: Medicare Other | Admitting: Nutrition

## 2014-11-18 VITALS — BP 70/56 | HR 97 | Temp 96.9°F | Resp 17 | Ht 68.0 in

## 2014-11-18 DIAGNOSIS — D6489 Other specified anemias: Secondary | ICD-10-CM

## 2014-11-18 DIAGNOSIS — Z8249 Family history of ischemic heart disease and other diseases of the circulatory system: Secondary | ICD-10-CM | POA: Diagnosis not present

## 2014-11-18 DIAGNOSIS — M1990 Unspecified osteoarthritis, unspecified site: Secondary | ICD-10-CM | POA: Diagnosis present

## 2014-11-18 DIAGNOSIS — Z885 Allergy status to narcotic agent status: Secondary | ICD-10-CM | POA: Diagnosis not present

## 2014-11-18 DIAGNOSIS — E876 Hypokalemia: Secondary | ICD-10-CM | POA: Diagnosis present

## 2014-11-18 DIAGNOSIS — Z452 Encounter for adjustment and management of vascular access device: Secondary | ICD-10-CM | POA: Diagnosis present

## 2014-11-18 DIAGNOSIS — R5382 Chronic fatigue, unspecified: Secondary | ICD-10-CM

## 2014-11-18 DIAGNOSIS — E43 Unspecified severe protein-calorie malnutrition: Secondary | ICD-10-CM | POA: Diagnosis present

## 2014-11-18 DIAGNOSIS — Z800 Family history of malignant neoplasm of digestive organs: Secondary | ICD-10-CM

## 2014-11-18 DIAGNOSIS — Z888 Allergy status to other drugs, medicaments and biological substances status: Secondary | ICD-10-CM | POA: Diagnosis not present

## 2014-11-18 DIAGNOSIS — D649 Anemia, unspecified: Secondary | ICD-10-CM

## 2014-11-18 DIAGNOSIS — Z87891 Personal history of nicotine dependence: Secondary | ICD-10-CM | POA: Diagnosis not present

## 2014-11-18 DIAGNOSIS — Z79891 Long term (current) use of opiate analgesic: Secondary | ICD-10-CM | POA: Diagnosis not present

## 2014-11-18 DIAGNOSIS — C3490 Malignant neoplasm of unspecified part of unspecified bronchus or lung: Secondary | ICD-10-CM

## 2014-11-18 DIAGNOSIS — Z79899 Other long term (current) drug therapy: Secondary | ICD-10-CM

## 2014-11-18 DIAGNOSIS — D72829 Elevated white blood cell count, unspecified: Secondary | ICD-10-CM

## 2014-11-18 DIAGNOSIS — C3492 Malignant neoplasm of unspecified part of left bronchus or lung: Secondary | ICD-10-CM | POA: Diagnosis present

## 2014-11-18 DIAGNOSIS — E46 Unspecified protein-calorie malnutrition: Secondary | ICD-10-CM | POA: Insufficient documentation

## 2014-11-18 DIAGNOSIS — A419 Sepsis, unspecified organism: Secondary | ICD-10-CM | POA: Diagnosis present

## 2014-11-18 DIAGNOSIS — R112 Nausea with vomiting, unspecified: Secondary | ICD-10-CM | POA: Diagnosis not present

## 2014-11-18 DIAGNOSIS — R630 Anorexia: Secondary | ICD-10-CM

## 2014-11-18 DIAGNOSIS — Y95 Nosocomial condition: Secondary | ICD-10-CM | POA: Diagnosis present

## 2014-11-18 DIAGNOSIS — H9190 Unspecified hearing loss, unspecified ear: Secondary | ICD-10-CM | POA: Diagnosis present

## 2014-11-18 DIAGNOSIS — J189 Pneumonia, unspecified organism: Secondary | ICD-10-CM | POA: Diagnosis present

## 2014-11-18 DIAGNOSIS — E8809 Other disorders of plasma-protein metabolism, not elsewhere classified: Secondary | ICD-10-CM | POA: Insufficient documentation

## 2014-11-18 DIAGNOSIS — Z66 Do not resuscitate: Secondary | ICD-10-CM | POA: Diagnosis present

## 2014-11-18 DIAGNOSIS — I9589 Other hypotension: Secondary | ICD-10-CM

## 2014-11-18 DIAGNOSIS — E860 Dehydration: Secondary | ICD-10-CM | POA: Diagnosis present

## 2014-11-18 DIAGNOSIS — Z881 Allergy status to other antibiotic agents status: Secondary | ICD-10-CM

## 2014-11-18 DIAGNOSIS — Z681 Body mass index (BMI) 19 or less, adult: Secondary | ICD-10-CM

## 2014-11-18 DIAGNOSIS — I959 Hypotension, unspecified: Secondary | ICD-10-CM | POA: Insufficient documentation

## 2014-11-18 DIAGNOSIS — Z931 Gastrostomy status: Secondary | ICD-10-CM | POA: Diagnosis not present

## 2014-11-18 DIAGNOSIS — R1310 Dysphagia, unspecified: Secondary | ICD-10-CM

## 2014-11-18 DIAGNOSIS — C227 Other specified carcinomas of liver: Secondary | ICD-10-CM | POA: Diagnosis present

## 2014-11-18 DIAGNOSIS — N400 Enlarged prostate without lower urinary tract symptoms: Secondary | ICD-10-CM | POA: Diagnosis present

## 2014-11-18 DIAGNOSIS — R42 Dizziness and giddiness: Secondary | ICD-10-CM

## 2014-11-18 DIAGNOSIS — R509 Fever, unspecified: Secondary | ICD-10-CM | POA: Diagnosis present

## 2014-11-18 DIAGNOSIS — R634 Abnormal weight loss: Secondary | ICD-10-CM | POA: Diagnosis present

## 2014-11-18 DIAGNOSIS — R251 Tremor, unspecified: Secondary | ICD-10-CM | POA: Diagnosis present

## 2014-11-18 DIAGNOSIS — I10 Essential (primary) hypertension: Secondary | ICD-10-CM | POA: Diagnosis present

## 2014-11-18 LAB — CBC
HEMATOCRIT: 19.1 % — AB (ref 39.0–52.0)
HEMOGLOBIN: 6.1 g/dL — AB (ref 13.0–17.0)
MCH: 31.4 pg (ref 26.0–34.0)
MCHC: 31.9 g/dL (ref 30.0–36.0)
MCV: 98.5 fL (ref 78.0–100.0)
Platelets: 213 10*3/uL (ref 150–400)
RBC: 1.94 MIL/uL — AB (ref 4.22–5.81)
RDW: 15.1 % (ref 11.5–15.5)
WBC: 13.9 10*3/uL — ABNORMAL HIGH (ref 4.0–10.5)

## 2014-11-18 LAB — COMPREHENSIVE METABOLIC PANEL (CC13)
ALT: 66 U/L — AB (ref 0–55)
ANION GAP: 10 meq/L (ref 3–11)
AST: 51 U/L — ABNORMAL HIGH (ref 5–34)
Albumin: 2 g/dL — ABNORMAL LOW (ref 3.5–5.0)
Alkaline Phosphatase: 442 U/L — ABNORMAL HIGH (ref 40–150)
BUN: 29.5 mg/dL — ABNORMAL HIGH (ref 7.0–26.0)
CHLORIDE: 104 meq/L (ref 98–109)
CO2: 22 meq/L (ref 22–29)
Calcium: 9.3 mg/dL (ref 8.4–10.4)
Creatinine: 1.3 mg/dL (ref 0.7–1.3)
EGFR: 55 mL/min/{1.73_m2} — AB (ref >90)
Glucose: 145 mg/dl — ABNORMAL HIGH (ref 70–140)
Potassium: 4.8 mEq/L (ref 3.5–5.1)
SODIUM: 136 meq/L (ref 136–145)
Total Bilirubin: 1.01 mg/dL (ref 0.20–1.20)
Total Protein: 6.1 g/dL — ABNORMAL LOW (ref 6.4–8.3)

## 2014-11-18 LAB — URINALYSIS, ROUTINE W REFLEX MICROSCOPIC
Bilirubin Urine: NEGATIVE
GLUCOSE, UA: NEGATIVE mg/dL
HGB URINE DIPSTICK: NEGATIVE
Ketones, ur: NEGATIVE mg/dL
Leukocytes, UA: NEGATIVE
Nitrite: NEGATIVE
PH: 6 (ref 5.0–8.0)
Protein, ur: NEGATIVE mg/dL
SPECIFIC GRAVITY, URINE: 1.02 (ref 1.005–1.030)
UROBILINOGEN UA: 1 mg/dL (ref 0.0–1.0)

## 2014-11-18 LAB — HOLD TUBE, BLOOD BANK

## 2014-11-18 LAB — CREATININE, SERUM
CREATININE: 0.97 mg/dL (ref 0.61–1.24)
GFR calc Af Amer: >60 mL/min (ref >60)
GFR calc non Af Amer: >60 mL/min (ref >60)

## 2014-11-18 LAB — CBC WITH DIFFERENTIAL/PLATELET
BASO%: 0.2 % (ref 0.0–2.0)
Basophils Absolute: 0.1 10*3/uL (ref 0.0–0.1)
EOS ABS: 0 10*3/uL (ref 0.0–0.5)
EOS%: 0 % (ref 0.0–7.0)
HCT: 24.8 % — ABNORMAL LOW (ref 38.4–49.9)
HGB: 8.1 g/dL — ABNORMAL LOW (ref 13.0–17.1)
LYMPH%: 3.7 % — AB (ref 14.0–49.0)
MCH: 31.7 pg (ref 27.2–33.4)
MCHC: 32.8 g/dL (ref 32.0–36.0)
MCV: 96.8 fL (ref 79.3–98.0)
MONO#: 0.9 10*3/uL (ref 0.1–0.9)
MONO%: 4.3 % (ref 0.0–14.0)
NEUT%: 91.8 % — ABNORMAL HIGH (ref 39.0–75.0)
NEUTROS ABS: 20.3 10*3/uL — AB (ref 1.5–6.5)
Platelets: 335 10*3/uL (ref 140–400)
RBC: 2.57 10*6/uL — AB (ref 4.20–5.82)
RDW: 15.5 % — ABNORMAL HIGH (ref 11.0–14.6)
WBC: 22.1 10*3/uL — AB (ref 4.0–10.3)
lymph#: 0.8 10*3/uL — ABNORMAL LOW (ref 0.9–3.3)

## 2014-11-18 LAB — PREPARE RBC (CROSSMATCH)

## 2014-11-18 LAB — HEMOGLOBIN AND HEMATOCRIT, BLOOD
HEMATOCRIT: 20.2 % — AB (ref 39.0–52.0)
Hemoglobin: 6.4 g/dL — CL (ref 13.0–17.0)

## 2014-11-18 LAB — I-STAT CG4 LACTIC ACID, ED: Lactic Acid, Venous: 1.96 mmol/L (ref 0.5–2.0)

## 2014-11-18 LAB — MRSA PCR SCREENING: MRSA BY PCR: NEGATIVE

## 2014-11-18 LAB — PROCALCITONIN: Procalcitonin: 22.91 ng/mL

## 2014-11-18 LAB — STREP PNEUMONIAE URINARY ANTIGEN: Strep Pneumo Urinary Antigen: NEGATIVE

## 2014-11-18 MED ORDER — VANCOMYCIN HCL IN DEXTROSE 1-5 GM/200ML-% IV SOLN
1000.0000 mg | Freq: Once | INTRAVENOUS | Status: AC
  Administered 2014-11-18: 1000 mg via INTRAVENOUS
  Filled 2014-11-18: qty 200

## 2014-11-18 MED ORDER — TRIAMCINOLONE ACETONIDE 55 MCG/ACT NA AERO: 1.0000 | INHALATION_SPRAY | Freq: Every day | NASAL | Status: DC | PRN

## 2014-11-18 MED ORDER — AZTREONAM 2 G IJ SOLR
2.0000 g | Freq: Three times a day (TID) | INTRAMUSCULAR | Status: DC
  Administered 2014-11-18 – 2014-11-22 (×11): 2 g via INTRAVENOUS
  Filled 2014-11-18 (×13): qty 2

## 2014-11-18 MED ORDER — SODIUM CHLORIDE 0.9 % IV SOLN
1000.0000 mL | INTRAVENOUS | Status: DC
  Administered 2014-11-18: 1000 mL via INTRAVENOUS

## 2014-11-18 MED ORDER — AZTREONAM 2 G IJ SOLR: 2.0000 g | Freq: Three times a day (TID) | INTRAMUSCULAR | Status: DC

## 2014-11-18 MED ORDER — FLUTICASONE PROPIONATE 50 MCG/ACT NA SUSP
1.0000 | Freq: Every day | NASAL | Status: DC | PRN
  Filled 2014-11-18: qty 16

## 2014-11-18 MED ORDER — ENOXAPARIN SODIUM 30 MG/0.3ML ~~LOC~~ SOLN
30.0000 mg | SUBCUTANEOUS | Status: DC
  Administered 2014-11-18: 30 mg via SUBCUTANEOUS
  Filled 2014-11-18: qty 0.3

## 2014-11-18 MED ORDER — PANTOPRAZOLE SODIUM 40 MG PO TBEC
40.0000 mg | DELAYED_RELEASE_TABLET | Freq: Every day | ORAL | Status: DC
  Administered 2014-11-18 – 2014-11-22 (×5): 40 mg via ORAL
  Filled 2014-11-18 (×5): qty 1

## 2014-11-18 MED ORDER — PROCHLORPERAZINE MALEATE 10 MG PO TABS
10.0000 mg | ORAL_TABLET | Freq: Four times a day (QID) | ORAL | Status: DC | PRN
  Administered 2014-11-19 – 2014-11-20 (×2): 10 mg via ORAL
  Filled 2014-11-18 (×2): qty 1

## 2014-11-18 MED ORDER — OSMOLITE 1.5 CAL PO LIQD
1000.0000 mL | ORAL | Status: DC
  Administered 2014-11-18: 1000 mL
  Filled 2014-11-18: qty 1000

## 2014-11-18 MED ORDER — VANCOMYCIN HCL IN DEXTROSE 750-5 MG/150ML-% IV SOLN
750.0000 mg | INTRAVENOUS | Status: DC
  Filled 2014-11-18: qty 150

## 2014-11-18 MED ORDER — SODIUM CHLORIDE 0.9 % IV SOLN: INTRAVENOUS | Status: DC

## 2014-11-18 MED ORDER — OSMOLITE 1.5 CAL PO LIQD
1000.0000 mL | ORAL | Status: DC
  Filled 2014-11-18: qty 1000

## 2014-11-18 MED ORDER — METOCLOPRAMIDE HCL 10 MG PO TABS
10.0000 mg | ORAL_TABLET | Freq: Three times a day (TID) | ORAL | Status: DC | PRN
  Administered 2014-11-19: 10 mg via ORAL
  Filled 2014-11-18 (×2): qty 1

## 2014-11-18 MED ORDER — FERROUS SULFATE 325 (65 FE) MG PO TABS
325.0000 mg | ORAL_TABLET | Freq: Three times a day (TID) | ORAL | Status: DC
  Administered 2014-11-18 – 2014-11-22 (×12): 325 mg via ORAL
  Filled 2014-11-18 (×12): qty 1

## 2014-11-18 MED ORDER — SODIUM CHLORIDE 0.9 % IV SOLN: Freq: Once | INTRAVENOUS | Status: DC

## 2014-11-18 MED ORDER — HYDROMORPHONE HCL 4 MG PO TABS: 4.0000 mg | ORAL_TABLET | Freq: Four times a day (QID) | ORAL | Status: DC | PRN

## 2014-11-18 MED ORDER — SODIUM CHLORIDE 0.9 % IV BOLUS (SEPSIS)
1000.0000 mL | Freq: Once | INTRAVENOUS | Status: AC
Start: 2014-11-18 — End: 2014-11-18
  Administered 2014-11-18: 1000 mL via INTRAVENOUS

## 2014-11-18 MED ORDER — OLANZAPINE 10 MG PO TABS
10.0000 mg | ORAL_TABLET | Freq: Every day | ORAL | Status: DC
  Administered 2014-11-18 – 2014-11-21 (×4): 10 mg via ORAL
  Filled 2014-11-18 (×6): qty 1

## 2014-11-18 MED ORDER — DEXTROSE 5 % IV SOLN
2.0000 g | Freq: Once | INTRAVENOUS | Status: AC
  Administered 2014-11-18: 2 g via INTRAVENOUS
  Filled 2014-11-18: qty 2

## 2014-11-18 MED ORDER — SODIUM CHLORIDE 0.9 % IV BOLUS (SEPSIS): 500.0000 mL | INTRAVENOUS | Status: AC

## 2014-11-18 MED ORDER — SODIUM CHLORIDE 0.9 % IV BOLUS (SEPSIS)
1000.0000 mL | Freq: Once | INTRAVENOUS | Status: AC
  Administered 2014-11-18: 1000 mL via INTRAVENOUS

## 2014-11-18 MED ORDER — SODIUM CHLORIDE 0.9 % IV SOLN
INTRAVENOUS | Status: DC
  Administered 2014-11-18 (×2): via INTRAVENOUS

## 2014-11-18 MED ORDER — MIRABEGRON ER 50 MG PO TB24
50.0000 mg | ORAL_TABLET | Freq: Every evening | ORAL | Status: DC
  Administered 2014-11-18 – 2014-11-21 (×4): 50 mg via ORAL
  Filled 2014-11-18 (×6): qty 1

## 2014-11-18 MED ORDER — SODIUM CHLORIDE 0.9 % IJ SOLN
10.0000 mL | INTRAMUSCULAR | Status: DC | PRN
  Administered 2014-11-18: 10 mL via INTRAVENOUS
  Filled 2014-11-18: qty 10

## 2014-11-18 MED ORDER — HEPARIN SOD (PORK) LOCK FLUSH 100 UNIT/ML IV SOLN
500.0000 [IU] | Freq: Once | INTRAVENOUS | Status: DC
  Filled 2014-11-18: qty 5

## 2014-11-18 NOTE — Assessment & Plan Note (Signed)
Patient is complaining of chronic nausea; but no vomiting.  He states that he has tried both Zofran, Ativan, and Compazine with only minimal effectiveness.  Patient will be prescribed Zyprexa to try for nausea.    Patient will receive IV fluid rehydration while at the cancer Center today.

## 2014-11-18 NOTE — Assessment & Plan Note (Signed)
Patient continues with minimal appetite and weight loss.  Patient states he has lost approximately 30 pounds within the last 2 months.  Patient was encouraged to push protein in his diet and eat multiple small meals.  Times a day if at all possible.  He was also encouraged to push fluids.

## 2014-11-18 NOTE — Assessment & Plan Note (Signed)
Patient has a history of hypertension in the past.  However, patient states that he has discontinued taking the atenolol recently; due to continued to work.  Blood pressure.  Blood pressure on check today was 94/64; with a heart rate of 116.  Advised patient he should continue to hold his atenolol; but he should advise his primary care provider and/or his cardiologist regarding the decision to hold the atenolol.  Also, patient's mild tachycardia at a rate of 116 could very well be related to discontinuation of the atenolol.

## 2014-11-18 NOTE — Assessment & Plan Note (Signed)
Patient's albumen is down to 2.3.  Patient was encouraged to push protein in his diet is much as possible.

## 2014-11-18 NOTE — Assessment & Plan Note (Signed)
Patient has a history of hypertension in the past.  However, patient states that he has discontinued taking the atenolol recently; due to continued decreased blood pressure.    The patient's wife reports that patient developed some chills without fever at approximately 1:30 AM this morning.  At approximately 4 AM this morning.  Patient's heart rate was elevated to 121 and his blood pressure was elevated 149/84.  Patient's wife gave patient a half of one of his Atenolol tabs (50 mg) due to elevated heart rate and mildly elevated blood pressure.  Patient's wife also confirm the patient reinitiated his IV fluid rehydration at home over this past weekend.  He received 1 L normal saline IV fluid rehydration, just yesterday.  Blood pressure on initial check here at the [facility] today was 70/56.  Patient appears much weaker; and continues to appear dehydrated as well.  Also, blood counts obtained today reveal a WBC that is increased from 13.1 up to 22.1 in the past 4 days.  Patient is also noted to be more anemic with hemoglobin decreased from 8.6, down to 8.1.  Patient denies any symptoms of infection.  He does complain of a clear runny nose on occasion only.  Patient denies any shortness of breath or cough.  He also denies any UTI symptoms.  However, patient did have a PEG tube placed approximately 2 weeks ago; and patient's wife has noted some mild discharge around the PEG insertion site.  On exam it does appear that there is some trace crusted secretions around the PEG tube insertion site; but no erythema or edema.  Severe hypertension could very well be secondary to atenolol dose given earlier this morning; but given patient's elevated white count-cannot rule out pending sepsis.  IV fluids were initiated wide open.  Patient will be transported to the emergency department for further evaluation and management.  Brief history.  Report were called to the emergency department charge nurse; prior to  the patient being transported to the emergency department via wheelchair per the [facility].

## 2014-11-18 NOTE — Progress Notes (Signed)
ANTIBIOTIC CONSULT NOTE - INITIAL  Pharmacy Consult for vancomycin/aztreonam Indication: HCAP  Allergies  Allergen Reactions  . Augmentin [Amoxicillin-Pot Clavulanate] Diarrhea  . Carboplatin     Rash and itching after carbo test dose  . Levaquin [Levofloxacin] Other (See Comments)    Knee pain  . Percocet [Oxycodone-Acetaminophen] Nausea And Vomiting  . Zolpidem Tartrate Diarrhea    Patient Measurements: Height: '5\' 8"'$  (172.7 cm) Weight: 105 lb (47.628 kg) IBW/kg (Calculated) : 68.4 Adjusted Body Weight:   Vital Signs: Temp: 98.2 F (36.8 C) (08/22 1304) Temp Source: Oral (08/22 1304) BP: 94/67 mmHg (08/22 1307) Pulse Rate: 90 (08/22 1304) Intake/Output from previous day:   Intake/Output from this shift:    Labs:  Recent Labs  11/18/14 1117 11/18/14 1117  WBC 22.1*  --   HGB 8.1*  --   PLT 335  --   CREATININE  --  1.3   Estimated Creatinine Clearance: 36.1 mL/min (by C-G formula based on Cr of 1.3). No results for input(s): VANCOTROUGH, VANCOPEAK, VANCORANDOM, GENTTROUGH, GENTPEAK, GENTRANDOM, TOBRATROUGH, TOBRAPEAK, TOBRARND, AMIKACINPEAK, AMIKACINTROU, AMIKACIN in the last 72 hours.   Microbiology: No results found for this or any previous visit (from the past 720 hour(s)).  Medical History: Past Medical History  Diagnosis Date  . Hypertension   . Arthritis RIGHT SHOULDER  . Immature cataract BILATERAL  . Non-small cell lung cancer DX SEPT 2010  W/ CHEMORADIATION AT THAT TIME -- NOW  W/ METS--  CURRENTLY ON MAINTENANCE CHEMO TX  EVERY 30 DAYS    ONCOLOGIST- DR [facility]  . Acute meniscal tear of knee RIGHT KNEE  . BPH (benign prostatic hypertrophy)   . Dizziness and giddiness 04/23/2014  . Tremor 04/23/2014    Right hand  . HOH (hard of hearing)     bilateral hearing aids  . Radiation 01/08/09-01/21/09    Mediastinum 30 Gy x 12 fractions  . Radiation 08/19/14-09/04/14    Palliative RT [address] 30 Gy    Medications:  Scheduled:  . [START ON  11/19/2014] vancomycin  750 mg Intravenous Q24H   Assessment: Pt is a 69 yo male diagnosed with HCAP. Pt has also been diagnosed with NSCLC undergoing treatment with nivolumab, last dose on 8/11. Recent PEG placement about 2 weeks ago No obvious source of infection per notes, but some crusted discharge near PEG tube and some mild cough as outpt.    Goal of Therapy:  Vancomycin trough level 15-20 mcg/ml  Plan:  Follow up culture results  Aztreonam 2 gr IV x 1 in ER, then aztreonam 2 gr IV q8h.  Vancomycin 1000 mg IV x1 in ER, then vancomycin 750 mg IV q24h.     Royetta Asal, PharmD, BCPS 11/18/14 14:35

## 2014-11-18 NOTE — Progress Notes (Signed)
SYMPTOM MANAGEMENT CLINIC   HPI: [name] 69 y.o. male diagnosed with lung cancer.  Currently undergoing Nivolumab immunotherapy.  Patient has a history of hypertension in the past.  However, patient states that he has discontinued taking the atenolol recently; due to continued decreased blood pressure.    The patient's wife reports that patient developed some chills without fever at approximately 1:30 AM this morning.  At approximately 4 AM this morning.  Patient's heart rate was elevated to 121 and his blood pressure was elevated 149/84.  Patient's wife gave patient a half of one of his Atenolol tabs (50 mg) due to elevated heart rate and mildly elevated blood pressure.  Patient's wife also confirm the patient reinitiated his IV fluid rehydration at home over this past weekend.  He received 1 L normal saline IV fluid rehydration, just yesterday.  Blood pressure on initial check here at the [facility] today was 70/56.  Patient appears much weaker; and continues to appear dehydrated as well.  Also, blood counts obtained today reveal a WBC that is increased from 13.1 up to 22.1 in the past 4 days.  Patient is also noted to be more anemic with hemoglobin decreased from 8.6, down to 8.1.  Patient denies any symptoms of infection.  He does complain of a clear runny nose on occasion only.  Patient denies any shortness of breath or cough.  He also denies any UTI symptoms.  However, patient did have a PEG tube placed approximately 2 weeks ago; and patient's wife has noted some mild discharge around the PEG insertion site.  On exam it does appear that there is some trace crusted secretions around the PEG tube insertion site; but no erythema or edema.  Severe hypertension could very well be secondary to atenolol dose given earlier this morning; but given patient's elevated white count-cannot rule out pending sepsis.  IV fluids were initiated wide open.  Patient will be transported to the  emergency department for further evaluation and management.  Brief history.  Report were called to the emergency department charge nurse; prior to the patient being transported to the emergency department via wheelchair per the [facility].  HPI  ROS  Past Medical History  Diagnosis Date  . Hypertension   . Arthritis RIGHT SHOULDER  . Immature cataract BILATERAL  . Non-small cell lung cancer DX SEPT 2010  W/ CHEMORADIATION AT THAT TIME -- NOW  W/ METS--  CURRENTLY ON MAINTENANCE CHEMO TX  EVERY 30 DAYS    ONCOLOGIST- [facility]  . Acute meniscal tear of knee RIGHT KNEE  . BPH (benign prostatic hypertrophy)   . Dizziness and giddiness 04/23/2014  . Tremor 04/23/2014    Right hand  . HOH (hard of hearing)     bilateral hearing aids  . Radiation 01/08/09-01/21/09    Mediastinum 30 Gy x 12 fractions  . Radiation 08/19/14-09/04/14    Palliative RT [address] [address] 30 Gy    Past Surgical History  Procedure Laterality Date  . 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 Rotator cuff repair  2008    LEFT SHOULDER  . Bilateral ear drum surgery  1960'S  . Orif left ring finger  and revascularization of radial side  02-16-2007    DEGLOVING INJURY  . Knee arthroscopy  12/21/2011    Procedure: ARTHROSCOPY KNEE;  Surgeon: Tobi Bastos, MD;  Location: [facility];  Service: Orthopedics;  Laterality: Right;  WITH MEDIAL MENISECTOMY  . Cataract extraction Bilateral   . Tonsillectomy      as child  . Ovarian cyst surgery      removed from left jaw area- benign  . Ovarian cyst surgery       NOTE; pt unaware of why this is in his chart  . Shoulder open rotator cuff repair Right 06/05/2014    Procedure: RIGHT ROTATOR CUFF REPAIR SHOULDER OPEN;  Surgeon: Latanya Maudlin, MD;  Location: [facility];  Service: Orthopedics;  Laterality: Right;  . Ercp N/A 08/09/2014    Procedure: ENDOSCOPIC RETROGRADE  CHOLANGIOPANCREATOGRAPHY (ERCP);  Surgeon: Carol Ada, MD;  Location: [facility];  Service: Endoscopy;  Laterality: N/A;  . Eus N/A 08/09/2014    Procedure: UPPER ENDOSCOPIC ULTRASOUND (EUS) RADIAL;  Surgeon: Carol Ada, MD;  Location: [facility];  Service: Endoscopy;  Laterality: N/A;  . Fine needle aspiration N/A 08/09/2014    Procedure: FINE NEEDLE ASPIRATION (FNA) LINEAR;  Surgeon: Carol Ada, MD;  Location: [facility];  Service: Endoscopy;  Laterality: N/A;  . Esophagogastroduodenoscopy (egd) with propofol N/A 08/09/2014    Procedure: ESOPHAGOGASTRODUODENOSCOPY (EGD) WITH PROPOFOL;  Surgeon: Carol Ada, MD;  Location: [facility];  Service: Endoscopy;  Laterality: N/A;    has Bronchogenic cancer of left lung; ARTHRITIS; Dyspnea; Pulmonary infiltrate; Osteoarthritis of right knee; Meniscus, medial, posterior horn derangement; Hypertension; Acute on chronic renal failure; Pancytopenia; Dizziness and giddiness; Tremor; Rotator cuff tear, non-traumatic; Obstructive jaundice due to cancer; Abdominal pain, generalized; CRF (chronic renal failure); Obstructive jaundice; Malnutrition of moderate degree; Abdominal mass; Abdominal pain, acute; Fatigue; Biliary obstruction; Cancer of intra-abdominal lymph nodes, secondary; Pneumonia; SIRS (systemic inflammatory response syndrome); Non-small cell lung cancer; Abdominal pain; Fever; Encounter for antineoplastic immunotherapy; Nausea with vomiting; Dehydration; Central line complication; Anorexia; Weight loss; Transaminitis; Hyperphosphatemia; Hypoalbuminemia; Hypotension; and Leukocytosis on his problem list.    is allergic to augmentin; carboplatin; levaquin; percocet; and zolpidem tartrate.    Medication List       This list is accurate as of: 11/18/14 12:53 PM.  Always use your most recent med list.               atenolol 100 MG tablet  Commonly known as:  TENORMIN  Take 100 mg by mouth every morning. Pt taking 34m once daily      feeding supplement (OSMOLITE 1.5 CAL) Liqd  Begin Osmolite 1.5 at 20 ml/hr continuous infusion via PEG for 12 hours overnight from 8 pm to 8 am. Increase 10 cc daily to initial goal of 95 cc/hr for 15 hours daily as tolerated.  Flush feeding tube with 240 cc free water 6 times daily. This provides 100% estimated needs. Please send feeding pump and instruct patient on how to use pump. Send TF supplies. Do not send formula.     ferrous sulfate 325 (65 FE) MG tablet  Take 1 tablet (325 mg total) by mouth 3 (three) times daily with meals.     HYDROmorphone 4 MG tablet  Commonly known as:  DILAUDID  Take 4 mg by mouth.     INTEGRA PLUS Caps  Take 1 capsule by mouth every morning.     LORazepam 0.5 MG tablet  Commonly known as:  ATIVAN  Take 1 tablet (0.5 mg total) by mouth every 6 (six) hours as needed (nausea).     metoCLOPramide 10 MG tablet  Commonly known as:  REGLAN  Take 1 tablet (10 mg total) by mouth every 8 (  eight) hours as needed for nausea.     morphine 15 MG 12 hr tablet  Commonly known as:  MS CONTIN  Take 15 mg by mouth every 12 (twelve) hours.     multivitamin tablet  Take 1 tablet by mouth every morning.     MYRBETRIQ 50 MG Tb24 tablet  Generic drug:  mirabegron ER  Take 50 mg by mouth every evening.     NASACORT ALLERGY 24HR 55 MCG/ACT Aero nasal inhaler  Generic drug:  triamcinolone  Place 1 spray into the nose daily as needed (congestion.).     OLANZapine 10 MG tablet  Commonly known as:  ZYPREXA  TAKE 1 TABLET(10 MG) BY MOUTH AT BEDTIME     omeprazole 40 MG capsule  Commonly known as:  PRILOSEC  Take 1 capsule (40 mg total) by mouth every evening.     ondansetron 8 MG tablet  Commonly known as:  ZOFRAN  Take 1 tablet (8 mg total) by mouth every 8 (eight) hours as needed for nausea or vomiting.     prochlorperazine 10 MG tablet  Commonly known as:  COMPAZINE  Take 1 tablet (10 mg total) by mouth every 6 (six) hours as needed for nausea or vomiting.       SENOKOT S PO  Take 2 tablets by mouth daily as needed (constipation).         PHYSICAL EXAMINATION  Oncology Vitals 11/18/2014 11/15/2014 11/14/2014 11/14/2014 11/07/2014 11/06/2014 11/06/2014  Height 173 cm - - - 173 cm - -  Weight (No Data) - - 46.04 kg 48.353 kg - -  Weight (lbs) (No Data) - - 101 lbs 8 oz 106 lbs 10 oz - -  BMI (kg/m2) - - - - 16.21 kg/m2 - -  Temp 96.9 96.8 97.8 97.7 98.4 - -  Pulse 97 114 99 116 84 79 79  Resp 17 18 18 16 16 16 16   Resp (Historical as of 10/28/11) - - - - - - -  SpO2 97 99 100 100 97 100 100  BSA (m2) - - - - 1.52 m2 - -   BP Readings from Last 3 Encounters:  11/18/14 70/56  11/15/14 116/74  11/14/14 103/70    Physical Exam  Constitutional: He is oriented to person, place, and time. He appears malnourished and dehydrated. He appears unhealthy. He appears cachectic. He appears toxic. He has a sickly appearance.  HENT:  Head: Normocephalic and atraumatic.  Eyes: Conjunctivae and EOM are normal. Pupils are equal, round, and reactive to light. Right eye exhibits no discharge. Left eye exhibits no discharge. No scleral icterus.  Neck: Normal range of motion. Neck supple. No JVD present. No tracheal deviation present. No thyromegaly present.  Cardiovascular: Normal rate, regular rhythm, normal heart sounds and intact distal pulses.   Pulmonary/Chest: Effort normal and breath sounds normal. No respiratory distress. He has no wheezes. He has no rales. He exhibits no tenderness.  Abdominal: Soft. Bowel sounds are normal. He exhibits no distension and no mass. There is no tenderness. There is no rebound and no guarding.  PEG intact; with trace crusted discharge around PEG tube insertion site.  There is no erythema or edema to site.  Musculoskeletal: Normal range of motion. He exhibits no edema or tenderness.  Lymphadenopathy:    He has no cervical adenopathy.  Neurological: He is alert and oriented to person, place, and time.  Skin: Skin is warm and  dry. No rash noted. No erythema. There is pallor.  Psychiatric: Affect normal.  Nursing note and vitals reviewed.   LABORATORY DATA:. Appointment on 11/18/2014  Component Date Value Ref Range Status  . WBC 11/18/2014 22.1* 4.0 - 10.3 10e3/uL Final  . NEUT# 11/18/2014 20.3* 1.5 - 6.5 10e3/uL Final  . HGB 11/18/2014 8.1* 13.0 - 17.1 g/dL Final  . HCT 11/18/2014 24.8* 38.4 - 49.9 % Final  . Platelets 11/18/2014 335  140 - 400 10e3/uL Final  . MCV 11/18/2014 96.8  79.3 - 98.0 fL Final  . MCH 11/18/2014 31.7  27.2 - 33.4 pg Final  . MCHC 11/18/2014 32.8  32.0 - 36.0 g/dL Final  . RBC 11/18/2014 2.57* 4.20 - 5.82 10e6/uL Final  . RDW 11/18/2014 15.5* 11.0 - 14.6 % Final  . lymph# 11/18/2014 0.8* 0.9 - 3.3 10e3/uL Final  . MONO# 11/18/2014 0.9  0.1 - 0.9 10e3/uL Final  . Eosinophils Absolute 11/18/2014 0.0  0.0 - 0.5 10e3/uL Final  . Basophils Absolute 11/18/2014 0.1  0.0 - 0.1 10e3/uL Final  . NEUT% 11/18/2014 91.8* 39.0 - 75.0 % Final  . LYMPH% 11/18/2014 3.7* 14.0 - 49.0 % Final  . MONO% 11/18/2014 4.3  0.0 - 14.0 % Final  . EOS% 11/18/2014 0.0  0.0 - 7.0 % Final  . BASO% 11/18/2014 0.2  0.0 - 2.0 % Final  . Sodium 11/18/2014 136  136 - 145 mEq/L Final  . Potassium 11/18/2014 4.8  3.5 - 5.1 mEq/L Final  . Chloride 11/18/2014 104  98 - 109 mEq/L Final  . CO2 11/18/2014 22  22 - 29 mEq/L Final  . Glucose 11/18/2014 145* 70 - 140 mg/dl Final  . BUN 11/18/2014 29.5* 7.0 - 26.0 mg/dL Final  . Creatinine 11/18/2014 1.3  0.7 - 1.3 mg/dL Final  . Total Bilirubin 11/18/2014 1.01  0.20 - 1.20 mg/dL Final  . Alkaline Phosphatase 11/18/2014 442* 40 - 150 U/L Final  . AST 11/18/2014 51* 5 - 34 U/L Final  . ALT 11/18/2014 66* 0 - 55 U/L Final  . Total Protein 11/18/2014 6.1* 6.4 - 8.3 g/dL Final  . Albumin 11/18/2014 2.0* 3.5 - 5.0 g/dL Final  . Calcium 11/18/2014 9.3  8.4 - 10.4 mg/dL Final  . Anion Gap 11/18/2014 10  3 - 11 mEq/L Final  . EGFR 11/18/2014 55* >90 ml/min/1.73 m2 Final   eGFR  is calculated using the CKD-EPI Creatinine Equation (2009)     RADIOGRAPHIC STUDIES: No results found.  ASSESSMENT/PLAN:    Anorexia Patient is complaining of minimal appetite and continued weight loss.  Patient states that he has lost approximately 30 pounds within the last 2 months.  Patient was prescribed Marinol for appetite stimulation; with only minimal effectiveness.   Patient now has a PEG tube; but states that he is intolerant of even a full can of his tube feedings; because he feels bloated. Prescribed Reglan last week for pt to try.   Patient plans to meet with the nutritionist here at the [facility] today.  Have advised patient to consider decreasing the amount of tube feedings given per hour; but also receiving the tube feeding infusion for a longer period of time each day.  Patient was advised to continue pushing protein in his diet and fluids.      Bronchogenic cancer of left lung Patient received his last cycle of Nivolumab immunotherapy on 11/07/2014.  Patient is scheduled to return on 11/22/2014 for labs, visit, and his next cycle of immunotherapy.    Dehydration Patient is complaining of chronic nausea;  but no vomiting.  He states that he has tried both Zofran, Ativan, and Compazine with only minimal effectiveness.  Patient was prescribed Zyprexa to try for nausea.  Pt has restarted his home IV fluid rehydration over the weekend; with minimal improvement.   Patient will receive IV fluid rehydration while at the cancer Center today.        Hypotension Patient has a history of hypertension in the past.  However, patient states that he has discontinued taking the atenolol recently; due to continued decreased blood pressure.    The patient's wife reports that patient developed some chills without fever at approximately 1:30 AM this morning.  At approximately 4 AM this morning.  Patient's heart rate was elevated to 121 and his blood pressure was elevated  149/84.  Patient's wife gave patient a half of one of his Atenolol tabs (50 mg) due to elevated heart rate and mildly elevated blood pressure.  Patient's wife also confirm the patient reinitiated his IV fluid rehydration at home over this past weekend.  He received 1 L normal saline IV fluid rehydration, just yesterday.  Blood pressure on initial check here at the [facility] today was 70/56.  Patient appears much weaker; and continues to appear dehydrated as well.  Also, blood counts obtained today reveal a WBC that is increased from 13.1 up to 22.1 in the past 4 days.  Patient is also noted to be more anemic with hemoglobin decreased from 8.6, down to 8.1.  Patient denies any symptoms of infection.  He does complain of a clear runny nose on occasion only.  Patient denies any shortness of breath or cough.  He also denies any UTI symptoms.  However, patient did have a PEG tube placed approximately 2 weeks ago; and patient's wife has noted some mild discharge around the PEG insertion site.  On exam it does appear that there is some trace crusted secretions around the PEG tube insertion site; but no erythema or edema.  Severe hypertension could very well be secondary to atenolol dose given earlier this morning; but given patient's elevated white count-cannot rule out pending sepsis.  IV fluids were initiated wide open.  Patient will be transported to the emergency department for further evaluation and management.  Brief history.  Report were called to the emergency department charge nurse; prior to the patient being transported to the emergency department via wheelchair per the [facility].     Leukocytosis Patient has a history of hypertension in the past.  However, patient states that he has discontinued taking the atenolol recently; due to continued decreased blood pressure.    The patient's wife reports that patient developed some chills without fever at approximately 1:30 AM this  morning.  At approximately 4 AM this morning.  Patient's heart rate was elevated to 121 and his blood pressure was elevated 149/84.  Patient's wife gave patient a half of one of his Atenolol tabs (50 mg) due to elevated heart rate and mildly elevated blood pressure.  Patient's wife also confirm the patient reinitiated his IV fluid rehydration at home over this past weekend.  He received 1 L normal saline IV fluid rehydration, just yesterday.  Blood pressure on initial check here at the [facility] today was 70/56.  Patient appears much weaker; and continues to appear dehydrated as well.  Also, blood counts obtained today reveal a WBC that is increased from 13.1 up to 22.1 in the past 4 days.  Patient is also noted to  be more anemic with hemoglobin decreased from 8.6, down to 8.1.  Patient denies any symptoms of infection.  He does complain of a clear runny nose on occasion only.  Patient denies any shortness of breath or cough.  He also denies any UTI symptoms.  However, patient did have a PEG tube placed approximately 2 weeks ago; and patient's wife has noted some mild discharge around the PEG insertion site.  On exam it does appear that there is some trace crusted secretions around the PEG tube insertion site; but no erythema or edema.  Severe hypertension could very well be secondary to atenolol dose given earlier this morning; but given patient's elevated white count-cannot rule out pending sepsis.  IV fluids were initiated wide open.  Patient will be transported to the emergency department for further evaluation and management.  Brief history.  Report were called to the emergency department charge nurse; prior to the patient being transported to the emergency department via wheelchair per the [facility].      Nausea with vomiting Patient continues to complain of some chronic nausea; and states he vomited once last night.  Patient already has Zofran, Compazine, and Ativan at  home for nausea.  He was also initiated on Zyprexa to help with nausea over this past weekend as well.  Patient stated understanding of all instructions; and was in agreement with this plan of care. The patient knows to call the clinic with any problems, questions or concerns.   Review/collaboration with [name] regarding all aspects of patient's visit today.   Total time spent with patient 40 minutes;  with greater 65  percent of that time spent in face to face counseling regarding patient's symptoms,  and coordination of care and follow up.  Disclaimer: This note was dictated with voice recognition software. Similar sounding words can inadvertently be transcribed and may not be corrected upon review.   Drue Second, NP 11/18/2014

## 2014-11-18 NOTE — Progress Notes (Signed)
Patients wife stated that the patient "does not do well when his hemoglobin falls below 8.5". She stated that in the past the patient has become "less alert, tired, and depressed" when his hemoglobin falls. Will pass the information along to the on call MD and day shift nurse at change of shift. I will continue to observe the patient for signs and symptoms associated with low Hgb. S.Montre Harbor, RN

## 2014-11-18 NOTE — Progress Notes (Addendum)
SYMPTOM MANAGEMENT CLINIC   HPI: [name] 69 y.o. male diagnosed with lung cancer.  Patient currently undergoing Nivolumab immunotherapy.  Patient called the cancer Center today complaining of continued nausea and occasional vomiting.  He continues with minimal appetite and continued weight loss to approximately 30 pounds within the past 2 months.  He feels dehydrated today; despite receiving IV fluid rehydration per his home health nurse just last week.  He denies any diarrhea.  He denies any recent fevers or chills.   HPI  ROS  Past Medical History  Diagnosis Date  . Hypertension   . Arthritis RIGHT SHOULDER  . Immature cataract BILATERAL  . Non-small cell lung cancer DX SEPT 2010  W/ CHEMORADIATION AT THAT TIME -- NOW  W/ METS--  CURRENTLY ON MAINTENANCE CHEMO TX  EVERY 30 DAYS    ONCOLOGIST- DR [facility]  . Acute meniscal tear of knee RIGHT KNEE  . BPH (benign prostatic hypertrophy)   . Dizziness and giddiness 04/23/2014  . Tremor 04/23/2014    Right hand  . HOH (hard of hearing)     bilateral hearing aids  . Radiation 01/08/09-01/21/09    Mediastinum 30 Gy x 12 fractions  . Radiation 08/19/14-09/04/14    Palliative RT Porta hepatic LN 30 Gy    Past Surgical History  Procedure Laterality Date  . 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 Rotator cuff repair  2008    LEFT SHOULDER  . Bilateral ear drum surgery  1960'S  . Orif left ring finger  and revascularization of radial side  02-16-2007    DEGLOVING INJURY  . Knee arthroscopy  12/21/2011    Procedure: ARTHROSCOPY KNEE;  Surgeon: Tobi Bastos, MD;  Location: [facility];  Service: Orthopedics;  Laterality: Right;  WITH MEDIAL MENISECTOMY  . Cataract extraction Bilateral   . Tonsillectomy      as child  . Ovarian cyst surgery      removed from left jaw area- benign  . Ovarian cyst surgery       NOTE; pt unaware of why this is  in his chart  . Shoulder open rotator cuff repair Right 06/05/2014    Procedure: RIGHT ROTATOR CUFF REPAIR SHOULDER OPEN;  Surgeon: Latanya Maudlin, MD;  Location: [facility];  Service: Orthopedics;  Laterality: Right;  . Ercp N/A 08/09/2014    Procedure: ENDOSCOPIC RETROGRADE CHOLANGIOPANCREATOGRAPHY (ERCP);  Surgeon: Carol Ada, MD;  Location: [facility];  Service: Endoscopy;  Laterality: N/A;  . Eus N/A 08/09/2014    Procedure: UPPER ENDOSCOPIC ULTRASOUND (EUS) RADIAL;  Surgeon: Carol Ada, MD;  Location: [facility];  Service: Endoscopy;  Laterality: N/A;  . Fine needle aspiration N/A 08/09/2014    Procedure: FINE NEEDLE ASPIRATION (FNA) LINEAR;  Surgeon: Carol Ada, MD;  Location: [facility];  Service: Endoscopy;  Laterality: N/A;  . Esophagogastroduodenoscopy (egd) with propofol N/A 08/09/2014    Procedure: ESOPHAGOGASTRODUODENOSCOPY (EGD) WITH PROPOFOL;  Surgeon: Carol Ada, MD;  Location: [facility];  Service: Endoscopy;  Laterality: N/A;    has Bronchogenic cancer of left lung; ARTHRITIS; Dyspnea; Pulmonary infiltrate; Osteoarthritis of right knee; Meniscus, medial, posterior horn derangement; Hypertension; Acute on chronic renal failure; Pancytopenia; Dizziness and giddiness; Tremor; Rotator cuff tear, non-traumatic; Obstructive jaundice due to cancer; Abdominal pain, generalized; CRF (chronic renal failure); Obstructive jaundice; Malnutrition of moderate degree; Abdominal mass; Abdominal pain, acute; Fatigue; Biliary obstruction; Cancer of intra-abdominal lymph nodes, secondary; Pneumonia;  SIRS (systemic inflammatory response syndrome); Non-small cell lung cancer; Abdominal pain; Fever; Encounter for antineoplastic immunotherapy; Nausea with vomiting; Dehydration; Central line complication; Anorexia; Weight loss; Transaminitis; Hyperphosphatemia; Hypoalbuminemia; and Hypotension on his problem list.    is allergic to augmentin; carboplatin; levaquin; percocet; and zolpidem tartrate.     Medication List       This list is accurate as of: 11/14/14 11:59 PM.  Always use your most recent med list.               atenolol 100 MG tablet  Commonly known as:  TENORMIN  Take 100 mg by mouth every morning. Pt taking 75m once daily     feeding supplement (OSMOLITE 1.5 CAL) Liqd  Begin Osmolite 1.5 at 20 ml/hr continuous infusion via PEG for 12 hours overnight from 8 pm to 8 am. Increase 10 cc daily to initial goal of 95 cc/hr for 15 hours daily as tolerated.  Flush feeding tube with 240 cc free water 6 times daily. This provides 100% estimated needs. Please send feeding pump and instruct patient on how to use pump. Send TF supplies. Do not send formula.     ferrous sulfate 325 (65 FE) MG tablet  Take 1 tablet (325 mg total) by mouth 3 (three) times daily with meals.     HYDROmorphone 4 MG tablet  Commonly known as:  DILAUDID  Take 4 mg by mouth.     INTEGRA PLUS Caps  Take 1 capsule by mouth every morning.     LORazepam 0.5 MG tablet  Commonly known as:  ATIVAN  Take 1 tablet (0.5 mg total) by mouth every 6 (six) hours as needed (nausea).     metoCLOPramide 10 MG tablet  Commonly known as:  REGLAN  Take 1 tablet (10 mg total) by mouth every 8 (eight) hours as needed for nausea.     morphine 15 MG 12 hr tablet  Commonly known as:  MS CONTIN  Take 15 mg by mouth every 12 (twelve) hours.     multivitamin tablet  Take 1 tablet by mouth every morning.     MYRBETRIQ 50 MG Tb24 tablet  Generic drug:  mirabegron ER  Take 50 mg by mouth every evening.     NASACORT ALLERGY 24HR 55 MCG/ACT Aero nasal inhaler  Generic drug:  triamcinolone  Place 1 spray into the nose daily as needed (congestion.).     OLANZapine 10 MG tablet  Commonly known as:  ZYPREXA  Take 1 tablet (10 mg total) by mouth at bedtime.     omeprazole 40 MG capsule  Commonly known as:  PRILOSEC  Take 1 capsule (40 mg total) by mouth every evening.     ondansetron 8 MG tablet  Commonly known as:   ZOFRAN  Take 1 tablet (8 mg total) by mouth every 8 (eight) hours as needed for nausea or vomiting.     prochlorperazine 10 MG tablet  Commonly known as:  COMPAZINE  Take 1 tablet (10 mg total) by mouth every 6 (six) hours as needed for nausea or vomiting.     SENOKOT S PO  Take 2 tablets by mouth daily as needed (constipation).         PHYSICAL EXAMINATION  Oncology Vitals 11/15/2014 11/14/2014 11/14/2014 11/07/2014 11/06/2014 11/06/2014 11/06/2014  Height - - - 173 cm - - -  Weight - - 46.04 kg 48.353 kg - - -  Weight (lbs) - - 101 lbs 8 oz 106 lbs 10 oz - - -  BMI (kg/m2) - - - 16.21 kg/m2 - - -  Temp 96.8 97.8 97.7 98.4 - - -  Pulse 114 99 116 84 79 79 80  Resp _0 Resp (Historical as of 10/28/11) - - - - - - -  SpO2 99 100 100 97 100 100 100  BSA (m2) - - - 1.52 m2 - - -   BP Readings from Last 3 Encounters:  11/15/14 116/74  11/14/14 103/70  11/14/14 94/64    Physical Exam  Constitutional: He is oriented to person, place, and time.  Patient appears fatigued, frail, and chronically ill.  HENT:  Head: Normocephalic and atraumatic.  Mouth/Throat: Oropharynx is clear and moist.  Eyes: Conjunctivae and EOM are normal. Pupils are equal, round, and reactive to light. Right eye exhibits no discharge. Left eye exhibits no discharge. No scleral icterus.  Neck: Normal range of motion. Neck supple. No JVD present. No tracheal deviation present. No thyromegaly present.  Cardiovascular: Normal heart sounds and intact distal pulses.   Tachycardic.  Pulmonary/Chest: Effort normal and breath sounds normal. No respiratory distress. He has no wheezes. He has no rales. He exhibits no tenderness.  Abdominal: Soft. Bowel sounds are normal. He exhibits no distension and no mass. There is no tenderness. There is no rebound and no guarding.  Left abdomen.  PEG tube intact.  Musculoskeletal: Normal range of motion. He exhibits no edema or tenderness.  Lymphadenopathy:    He has  no cervical adenopathy.  Neurological: He is alert and oriented to person, place, and time.  Skin: Skin is warm and dry. No rash noted. No erythema. There is pallor.  Patient has pink/red spider veins directly across the right upper chest Port-A-Cath hub site.  There is no actual erythema, warmth, edema, tenderness, or red streaks.  Psychiatric: Affect normal.  Nursing note and vitals reviewed.   LABORATORY DATA:. Appointment on 11/14/2014  Component Date Value Ref Range Status  . Sodium 11/14/2014 135* 136 - 145 mEq/L Final  . Potassium 11/14/2014 4.2  3.5 - 5.1 mEq/L Final  . Chloride 11/14/2014 102  98 - 109 mEq/L Final  . CO2 11/14/2014 23  22 - 29 mEq/L Final  . Glucose 11/14/2014 181* 70 - 140 mg/dl Final  . BUN 11/14/2014 19.0  7.0 - 26.0 mg/dL Final  . Creatinine 11/14/2014 1.0  0.7 - 1.3 mg/dL Final  . Total Bilirubin 11/14/2014 0.58  0.20 - 1.20 mg/dL Final  . Alkaline Phosphatase 11/14/2014 555* 40 - 150 U/L Final  . AST 11/14/2014 41* 5 - 34 U/L Final  . ALT 11/14/2014 66* 0 - 55 U/L Final  . Total Protein 11/14/2014 6.6  6.4 - 8.3 g/dL Final  . Albumin 11/14/2014 2.3* 3.5 - 5.0 g/dL Final  . Calcium 11/14/2014 9.4  8.4 - 10.4 mg/dL Final  . Anion Gap 11/14/2014 11  3 - 11 mEq/L Final  . EGFR 11/14/2014 74* >90 ml/min/1.73 m2 Final   eGFR is calculated using the CKD-EPI Creatinine Equation (2009)  . Phosphorus 11/14/2014 1.8* 2.1 - 4.3 mg/dL Final   ** Please note change in unit of measure and reference range(s). **   . WBC 11/14/2014 13.1* 4.0 - 10.3 10e3/uL Final  . NEUT# 11/14/2014 11.5* 1.5 - 6.5 10e3/uL Final  . HGB 11/14/2014 8.6* 13.0 - 17.1 g/dL Final  . HCT 11/14/2014 26.3* 38.4 - 49.9 % Final  . Platelets 11/14/2014 308  140 - 400 10e3/uL Final  . MCV  11/14/2014 96.2  79.3 - 98.0 fL Final  . MCH 11/14/2014 31.5  27.2 - 33.4 pg Final  . MCHC 11/14/2014 32.7  32.0 - 36.0 g/dL Final  . RBC 11/14/2014 2.73* 4.20 - 5.82 10e6/uL Final  . RDW 11/14/2014 15.2*  11.0 - 14.6 % Final  . lymph# 11/14/2014 0.6* 0.9 - 3.3 10e3/uL Final  . MONO# 11/14/2014 1.0* 0.1 - 0.9 10e3/uL Final  . Eosinophils Absolute 11/14/2014 0.0  0.0 - 0.5 10e3/uL Final  . Basophils Absolute 11/14/2014 0.0  0.0 - 0.1 10e3/uL Final  . NEUT% 11/14/2014 87.2* 39.0 - 75.0 % Final  . LYMPH% 11/14/2014 4.6* 14.0 - 49.0 % Final  . MONO% 11/14/2014 7.7  0.0 - 14.0 % Final  . EOS% 11/14/2014 0.3  0.0 - 7.0 % Final  . BASO% 11/14/2014 0.2  0.0 - 2.0 % Final  . Magnesium 11/14/2014 2.0  1.5 - 2.5 mg/dl Final     RADIOGRAPHIC STUDIES: No results found.  ASSESSMENT/PLAN:    Anorexia Patient is complaining of minimal appetite and continued weight loss.  Patient states that he has lost approximately 30 pounds within the last 2 months.  Patient was prescribed Marinol for appetite stimulation.  Patient now has a PEG tube; but states that he is intolerant of even a full can of his tube feedings; because he feels bloated.  Patient plans to meet with the nutritionist here at the [facility] today.  Have advised patient to consider decreasing the amount of tube feedings given per hour; but also receiving the tube feeding infusion for a longer period of time each day.  Patient was advised to continue pushing protein in his diet and fluids.    Bronchogenic cancer of left lung Patient received his last cycle of Nivolumab immunotherapy on 11/07/2014.  Patient will return tomorrow 11/15/2014 for additional IV fluid rehydration.  Patient is scheduled to return on 11/22/2014 for labs, visit, and his next cycle of immunotherapy.  Dehydration Patient is complaining of chronic nausea; but no vomiting.  He states that he has tried both Zofran, Ativan, and Compazine with only minimal effectiveness.  Patient will be prescribed Zyprexa to try for nausea.    Patient will receive IV fluid rehydration while at the cancer Center today.      Hyperbilirubinemia Bilirubin remains low at 2.3.   Patient was encouraged to push protein in his diet is much as possible.  Hyperphosphatemia Alkaline phosphatase elevated to 555.  Will continue to monitor closely.  Most likely, elevated alkaline phosphatase is secondary to disease.  Hypoalbuminemia Patient's albumen is down to 2.3.  Patient was encouraged to push protein in his diet is much as possible.  Nausea with vomiting Patient is complaining of chronic nausea; but no vomiting.  He states that he has tried both Zofran, Ativan, and Compazine with only minimal effectiveness.  Patient will be prescribed Zyprexa to try for nausea.    Patient will receive IV fluid rehydration while at the cancer Center today.    Transaminitis AST elevated to 41 and ALT elevated to 66.  Will continue to monitor closely.  Weight loss Patient continues with minimal appetite and weight loss.  Patient states he has lost approximately 30 pounds within the last 2 months.  Patient was encouraged to push protein in his diet and eat multiple small meals.  Times a day if at all possible.  He was also encouraged to push fluids.    Hypotension Patient has a history of hypertension in the  past.  However, patient states that he has discontinued taking the atenolol recently; due to continued to work.  Blood pressure.  Blood pressure on check today was 94/64; with a heart rate of 116.  Advised patient he should continue to hold his atenolol; but he should advise his primary care provider and/or his cardiologist regarding the decision to hold the atenolol.  Also, patient's mild tachycardia at a rate of 116 could very well be related to discontinuation of the atenolol.   Patient stated understanding of all instructions; and was in agreement with this plan of care. The patient knows to call the clinic with any problems, questions or concerns.   Reviewed all with Dr. [name] today.    Total time spent with patient was 40 minutes;  with greater than 75 percent of that time  spent in face to face counseling regarding patient's symptoms,  and coordination of care and follow up.  Disclaimer: This note was dictated with voice recognition software. Similar sounding words can inadvertently be transcribed and may not be corrected upon review.   Drue Second, NP 11/18/2014   ADDENDUM: Hematology/Oncology Attending: I had a face to face encounter with the patient. I recommended his care plan. This is a very pleasant 69 years old white male with metastatic non-small cell lung cancer who is currently undergoing treatment with immunotherapy with Nivolumab status post 3 cycles of treatment and he is expected to start cycle #4 next week. The patient has been complaining of lack of appetite, poor oral intake as well as worsening weight loss. He currently has a PEG tube and receiving supplemental nutrition. Has been complaining of increasing nausea and vomiting not responding to his current treatment with Compazine, Zofran and Ativan. We will arrange for the patient to receive IV hydration today. For the nausea, he will be started on Zyprexa 10 mg by mouth daily at bedtime. The patient also has hypotension. He was advised to hold his treatment with atenolol for now. He would come back for follow-up visit in one week for evaluation before starting the next cycle of his immunotherapy. He was advised to call immediately if he has any concerning symptoms in the interval.   Disclaimer: This note was dictated with voice recognition software. Similar sounding words can inadvertently be transcribed and may be missed upon review. Eilleen Kempf., MD 11/18/2014

## 2014-11-18 NOTE — Assessment & Plan Note (Signed)
Patient has a history of hypertension in the past.  However, patient states that he has discontinued taking the atenolol recently; due to continued decreased blood pressure.    The patient's wife reports that patient developed some chills without fever at approximately 1:30 AM this morning.  At approximately 4 AM this morning.  Patient's heart rate was elevated to 121 and his blood pressure was elevated 149/84.  Patient's wife gave patient a half of one of his Atenolol tabs (50 mg) due to elevated heart rate and mildly elevated blood pressure.  Patient's wife also confirm the patient reinitiated his IV fluid rehydration at home over this past weekend.  He received 1 L normal saline IV fluid rehydration, just yesterday.  Blood pressure on initial check here at the [facility] today was 70/56.  Patient appears much weaker; and continues to appear dehydrated as well.  Also, blood counts obtained today reveal a WBC that is increased from 13.1 up to 22.1 in the past 4 days.  Patient is also noted to be more anemic with hemoglobin decreased from 8.6, down to 8.1.  Patient denies any symptoms of infection.  He does complain of a clear runny nose on occasion only.  Patient denies any shortness of breath or cough.  He also denies any UTI symptoms.  However, patient did have a PEG tube placed approximately 2 weeks ago; and patient's wife has noted some mild discharge around the PEG insertion site.  On exam it does appear that there is some trace crusted secretions around the PEG tube insertion site; but no erythema or edema.  Severe hypertension could very well be secondary to atenolol dose given earlier this morning; but given patient's elevated white count-cannot rule out pending sepsis.  IV fluids were initiated wide open.  Patient will be transported to the emergency department for further evaluation and management.  Brief history.  Report were called to the emergency department charge nurse; prior to  the patient being transported to the emergency department via wheelchair per the [facility].

## 2014-11-18 NOTE — Assessment & Plan Note (Signed)
Patient is complaining of chronic nausea; but no vomiting.  He states that he has tried both Zofran, Ativan, and Compazine with only minimal effectiveness.  Patient was prescribed Zyprexa to try for nausea.  Pt has restarted his home IV fluid rehydration over the weekend; with minimal improvement.   Patient will receive IV fluid rehydration while at the cancer Center today.

## 2014-11-18 NOTE — Progress Notes (Signed)
CRITICAL VALUE ALERT  Critical value received:  Hemoglobin 6.1  Date of notification:  11/18/2014  Time of notification:  18:35  Critical value read back: Yes   MD notified (1st page):  Rai   Time of first page:  18:40pm   Responding MD:  Buford Dresser order repeat hemoglobin and hematocrit STAT.

## 2014-11-18 NOTE — Progress Notes (Signed)
Hemoglobin 6.4. K. Schorr paged. No new orders given. Informed night shift RN to follow up.

## 2014-11-18 NOTE — Assessment & Plan Note (Signed)
Patient received his last cycle of Nivolumab immunotherapy on 11/07/2014.  Patient will return tomorrow 11/15/2014 for additional IV fluid rehydration.  Patient is scheduled to return on 11/22/2014 for labs, visit, and his next cycle of immunotherapy.

## 2014-11-18 NOTE — Progress Notes (Signed)
Addendum: This was a shared visit with Dr. [name].

## 2014-11-18 NOTE — Progress Notes (Signed)
Brief nutrition follow-up completed with patient and wife today. Wife reports patient tolerating Osmolite 1.5 continuous tube feeding at 30 cc an hour. Patient averages between 2 and 3 cans daily of continuous feeding. He continues to try to eat and drink small amounts and tolerates Carnation breakfast essentials and Ensure Plus by mouth. Patient reports he has not had a bowel movement in several days. He reports he was not concerned since he is not eating very much.  Patient thinks he will have a bowel movement today. Patient has been on Reglan since Saturday.  Patient has had continued nausea however he has no vomiting. Wife verbalizes concern with decreasing hemoglobin. Abdomen appointment today with nurse practitioner to address patient's concerns.  Recommended patient continue isotonic tube feedings of Osmolite 1.5 at 30 cc an hour as tolerated.  If unable to increase feedings, recommend changing tube feeding to vital 1.5 a calorically dense, peptide-based formula and monitor for improved tolerance. Patient has severe malnutrition and needs to be monitored for refeeding syndrome if tube feeding is to advance.  Noted phosphorus was 1.8 on 11/14/14.

## 2014-11-18 NOTE — Progress Notes (Signed)
[facility]  Patient Status: Active (receiving services up to time of hospitalization)  [facility] is providing the following services: RN  If patient discharges after hours, please call [phone].   [name] 11/18/2014, 3:51 PM

## 2014-11-18 NOTE — Assessment & Plan Note (Addendum)
Patient is complaining of minimal appetite and continued weight loss.  Patient states that he has lost approximately 30 pounds within the last 2 months.  Patient was prescribed Marinol for appetite stimulation.  Patient now has a PEG tube; but states that he is intolerant of even a full can of his tube feedings; because he feels bloated.  Patient plans to meet with the nutritionist here at the [place] today.  Have advised patient to consider decreasing the amount of tube feedings given per hour; but also receiving the tube feeding infusion for a longer period of time each day.  Patient was advised to continue pushing protein in his diet and fluids.

## 2014-11-18 NOTE — ED Notes (Signed)
Bed: KC46 Expected date:  Expected time:  Means of arrival:  Comments: [facility] pt

## 2014-11-18 NOTE — ED Notes (Signed)
Pt, being sent by [facility], c/o dehydration, hypotension, and chills.  Pt reports taking "half of an atenolol" this morning.  Pt had a peg tube placed last week and WBCs have increased to 22.  Hx of Lung CA and anemia.  Pt takes an immuno therapy, but no chemo.

## 2014-11-18 NOTE — Assessment & Plan Note (Signed)
AST elevated to 41 and ALT elevated to 66.  Will continue to monitor closely.

## 2014-11-18 NOTE — ED Notes (Signed)
Patient is aware that we need urine-urinal at bedside

## 2014-11-18 NOTE — Assessment & Plan Note (Signed)
Alkaline phosphatase elevated to 555.  Will continue to monitor closely.  Most likely, elevated alkaline phosphatase is secondary to disease.

## 2014-11-18 NOTE — Telephone Encounter (Signed)
LAST NIGHT PT. HAD CHILLS BUT NO FEVER. HE IS VERY WEAK AND GETS DIZZY WHEN HE TRIES TO SIT UP IN BED. PT. RECEIVED A LITER OF IV FLUIDS YESTERDAY. HIS ORAL INTAKE WAS 24 OUNCES AND G-TUBE WAS 360CC. LAST NIGHT PT.'S B/P WAS 149/84 PULSE 121. HIS WIFE GAVE PT. ATENOLOL '50MG'$ . THIS MORNING PT.'S B/P IS 126/51 PULSE IS 46. VERBAL ORDER AND READ BACK TO CYNDEE BACON,NP- CBC,CMET, AND TYPE AND HOLD. ALSO PT. TO SEE CYNDEE. ORDERS AND POF COMPLETED AND SENT.

## 2014-11-18 NOTE — Assessment & Plan Note (Signed)
Bilirubin remains low at 2.3.  Patient was encouraged to push protein in his diet is much as possible.

## 2014-11-18 NOTE — Assessment & Plan Note (Addendum)
Patient is complaining of chronic nausea; but no vomiting.  He states that he has tried both Zofran, Ativan, and Compazine with only minimal effectiveness.  Patient will be prescribed Zyprexa to try for nausea.    Patient will receive IV fluid rehydration while at the cancer Center today.

## 2014-11-18 NOTE — H&P (Signed)
History and Physical        Hospital Admission Note Date: 11/18/2014  Patient name: [name] Medical record number: [id number] Date of birth: 01/10/1946 Age: 69 y.o. Gender: male  PCP: [name], MD  Referring physician:  Dr Reather Converse  Chief Complaint:  Low-grade fever with chills, hypotension  HPI: Patient is a 69 year old male with lung cancer- non-small cell, diagnosed in September 2010, currently undergoing chemotherapy, hypertension, anorexia, dysphagia recently had PEG tube placed on 11/06/2014 presented from the [facility] for hypotension. History was obtained from the patient and his wife. Patient reported that He has not been eating well in the last few months, had recent PEG tube placed on 11/06/2014. He started having coughing with clear sputum in the last 2-3 days, worsened overnight with shaking chills, one episode of vomiting. The patient had received chemotherapy last week for the third round. Heart rate was also elevated in 120s. Patient's wife had given him off of his atenolol tabs due to elevated heart rate at 4 AM this morning. Patient had reinitiated his IV fluid hydration at home over the past weekend. At the cancer Center BP was 70/56. ED workup showed BUN 29, creatinine 1.3, sodium 136, WBCs 22.1, hemoglobin 8.1, platelets 335, lactic acid 1.9. Chest x-ray showed right lower lobe pneumonia  Review of Systems:  Constitutional: + fever, chills, diaphoresis, poor appetite and fatigue.  HEENT: Denies photophobia, eye pain, redness, hearing loss, ear pain, congestion, sore throat, rhinorrhea, sneezing, mouth sores, trouble swallowing, neck pain, neck stiffness and tinnitus.   Respiratory: Please see history of present illness  Cardiovascular: Denies chest pain, palpitations and leg swelling.  Gastrointestinal:Please see history of present  illness, no history of any diarrhea or constipation, hematocrit occasional melena  Genitourinary: Denies dysuria, urgency, frequency, hematuria, flank pain and difficulty urinating.  Musculoskeletal: Denies myalgias, back pain, joint swelling, arthralgias and gait problem.  Skin: Denies pallor, rash and wound.  Neurological: Denies dizziness, seizures, syncope, light-headedness, numbness and headaches. + generalized weakness  Hematological: Denies adenopathy. Easy bruising, personal or family bleeding history  Psychiatric/Behavioral: Denies suicidal ideation, mood changes, confusion, nervousness, sleep disturbance and agitation  Past Medical History: Past Medical History  Diagnosis Date  . Hypertension   . Arthritis RIGHT SHOULDER  . Immature cataract BILATERAL  . Non-small cell lung cancer DX SEPT 2010  W/ CHEMORADIATION AT THAT TIME -- NOW  W/ METS--  CURRENTLY ON MAINTENANCE CHEMO TX  EVERY 30 DAYS    ONCOLOGIST- DR [facility]  . Acute meniscal tear of knee RIGHT KNEE  . BPH (benign prostatic hypertrophy)   . Dizziness and giddiness 04/23/2014  . Tremor 04/23/2014    Right hand  . HOH (hard of hearing)     bilateral hearing aids  . Radiation 01/08/09-01/21/09    Mediastinum 30 Gy x 12 fractions  . Radiation 08/19/14-09/04/14    Palliative RT Porta hepatic LN 30 Gy    Past Surgical History  Procedure Laterality Date  . 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 Rotator cuff repair  2008    LEFT SHOULDER  .  Bilateral ear drum surgery  1960'S  . Orif left ring finger  and revascularization of radial side  02-16-2007    DEGLOVING INJURY  . Knee arthroscopy  12/21/2011    Procedure: ARTHROSCOPY KNEE;  Surgeon: Tobi Bastos, MD;  Location: [facility];  Service: Orthopedics;  Laterality: Right;  WITH MEDIAL MENISECTOMY  . Cataract extraction Bilateral   . Tonsillectomy      as child  . Ovarian cyst  surgery      removed from left jaw area- benign  . Ovarian cyst surgery       NOTE; pt unaware of why this is in his chart  . Shoulder open rotator cuff repair Right 06/05/2014    Procedure: RIGHT ROTATOR CUFF REPAIR SHOULDER OPEN;  Surgeon: Latanya Maudlin, MD;  Location: [facility];  Service: Orthopedics;  Laterality: Right;  . Ercp N/A 08/09/2014    Procedure: ENDOSCOPIC RETROGRADE CHOLANGIOPANCREATOGRAPHY (ERCP);  Surgeon: Carol Ada, MD;  Location: [facility];  Service: Endoscopy;  Laterality: N/A;  . Eus N/A 08/09/2014    Procedure: UPPER ENDOSCOPIC ULTRASOUND (EUS) RADIAL;  Surgeon: Carol Ada, MD;  Location: [facility];  Service: Endoscopy;  Laterality: N/A;  . Fine needle aspiration N/A 08/09/2014    Procedure: FINE NEEDLE ASPIRATION (FNA) LINEAR;  Surgeon: Carol Ada, MD;  Location: [facility];  Service: Endoscopy;  Laterality: N/A;  . Esophagogastroduodenoscopy (egd) with propofol N/A 08/09/2014    Procedure: ESOPHAGOGASTRODUODENOSCOPY (EGD) WITH PROPOFOL;  Surgeon: Carol Ada, MD;  Location: [facility];  Service: Endoscopy;  Laterality: N/A;    Medications: Prior to Admission medications   Medication Sig Start Date End Date Taking? Authorizing Provider  atenolol (TENORMIN) 100 MG tablet Take 50 mg by mouth daily.    Yes Historical Provider, MD  FeFum-FePoly-FA-B Cmp-C-Biot (INTEGRA PLUS) CAPS Take 1 capsule by mouth every morning. 10/24/14  Yes [name], MD  ferrous sulfate 325 (65 FE) MG tablet Take 1 tablet (325 mg total) by mouth 3 (three) times daily with meals. 11/01/14  Yes [name], MD  HYDROmorphone (DILAUDID) 4 MG tablet Take 4 mg by mouth every 6 (six) hours as needed for severe pain.  11/06/14  Yes Historical Provider, MD  metoCLOPramide (REGLAN) 10 MG tablet Take 1 tablet (10 mg total) by mouth every 8 (eight) hours as needed for nausea. 11/15/14  Yes Susanne Borders, NP  Multiple Vitamin (MULTIVITAMIN) tablet Take 1 tablet by mouth every morning.    Yes  Historical Provider, MD  MYRBETRIQ 50 MG TB24 tablet Take 50 mg by mouth every evening.  01/09/14  Yes Historical Provider, MD  Nutritional Supplements (FEEDING SUPPLEMENT, OSMOLITE 1.5 CAL,) LIQD Begin Osmolite 1.5 at 20 ml/hr continuous infusion via PEG for 12 hours overnight from 8 pm to 8 am. Increase 10 cc daily to initial goal of 95 cc/hr for 15 hours daily as tolerated.  Flush feeding tube with 240 cc free water 6 times daily. This provides 100% estimated needs. Please send feeding pump and instruct patient on how to use pump. Send TF supplies. Do not send formula. 11/07/14  Yes [name], MD  OLANZapine (ZYPREXA) 10 MG tablet TAKE 1 TABLET(10 MG) BY MOUTH AT BEDTIME 11/15/14  Yes Susanne Borders, NP  omeprazole (PRILOSEC) 40 MG capsule Take 1 capsule (40 mg total) by mouth every evening. 11/07/14  Yes Adrena E Johnson, PA-C  ondansetron (ZOFRAN) 8 MG tablet Take 1 tablet (8 mg total) by mouth every 8 (eight) hours as needed for nausea  or vomiting. 10/01/14  Yes Susanne Borders, NP  prochlorperazine (COMPAZINE) 10 MG tablet Take 1 tablet (10 mg total) by mouth every 6 (six) hours as needed for nausea or vomiting. 10/15/14  Yes [name], MD  Sennosides-Docusate Sodium (SENOKOT S PO) Take 2 tablets by mouth daily as needed (constipation).    Yes Historical Provider, MD  triamcinolone (NASACORT ALLERGY 24HR) 55 MCG/ACT AERO nasal inhaler Place 1 spray into the nose daily as needed (congestion.).   Yes Historical Provider, MD  LORazepam (ATIVAN) 0.5 MG tablet Take 1 tablet (0.5 mg total) by mouth every 6 (six) hours as needed (nausea). Patient not taking: Reported on 11/04/2014 10/01/14   Susanne Borders, NP    Allergies:   Allergies  Allergen Reactions  . Augmentin [Amoxicillin-Pot Clavulanate] Diarrhea  . Carboplatin     Rash and itching after carbo test dose  . Levaquin [Levofloxacin] Other (See Comments)    Knee pain  . Percocet [Oxycodone-Acetaminophen] Nausea And Vomiting  .  Zolpidem Tartrate Diarrhea    Social History:  reports that he quit smoking about 33 years ago. His smoking use included Cigarettes. He has a 40 pack-year smoking history. He has never used smokeless tobacco. He reports that he drinks about 3.5 oz of alcohol per week. He reports that he does not use illicit drugs.  Family History: Family History  Problem Relation Age of Onset  . Colon cancer    . Heart attack    . Cancer    . Hypertension    . Hyperlipidemia    . Congestive Heart Failure Mother   . Cancer Sister   . Cancer Maternal Grandfather   . Colon cancer Father   . Heart attack Brother     Physical Exam: Blood pressure 94/63, pulse 89, temperature 98.2 F (36.8 C), temperature source Oral, resp. rate 21, height 5' 8"  (1.727 m), weight 47.628 kg (105 lb), SpO2 94 %. General: Alert, awake, oriented x3, in no acute distress, sickly appearing, cachectic HEENT: normocephalic, atraumatic, anicteric sclera, pink conjunctiva, pupils equal and reactive to light and accomodation, oropharynx clear Neck: supple, no masses or lymphadenopathy, no goiter, no bruits  Heart: Regular rate and rhythm, without murmurs, rubs or gallops. Lungs: Decreased breath sound at the bases, slight wheezing Abdomen: Soft, nontender, nondistended, positive bowel sounds, no masses. PEG intact with trace discharge, no erythema or edema around the site  Extremities: No clubbing, cyanosis or edema with positive pedal pulses. Neuro: Grossly intact, no focal neurological deficits, strength 5/5 upper and lower extremities bilaterally Psych: alert and oriented x 3, normal mood and affect Skin: no rashes or lesions, warm and dry   LABS on Admission:  Basic Metabolic Panel:  Recent Labs Lab 11/14/14 1159 11/14/14 1200 11/18/14 1117  NA 135*  --  136  K 4.2  --  4.8  CO2 23  --  22  GLUCOSE 181*  --  145*  BUN 19.0  --  29.5*  CREATININE 1.0  --  1.3  CALCIUM 9.4  --  9.3  MG  --  2.0  --   PHOS  --   1.8*  --    Liver Function Tests:  Recent Labs Lab 11/14/14 1159 11/18/14 1117  AST 41* 51*  ALT 66* 66*  ALKPHOS 555* 442*  BILITOT 0.58 1.01  PROT 6.6 6.1*  ALBUMIN 2.3* 2.0*   No results for input(s): LIPASE, AMYLASE in the last 168 hours. No results for input(s): AMMONIA in the last  168 hours. CBC:  Recent Labs Lab 11/14/14 1200 11/18/14 1117  WBC 13.1* 22.1*  NEUTROABS 11.5* 20.3*  HGB 8.6* 8.1*  HCT 26.3* 24.8*  MCV 96.2 96.8  PLT 308 335   Cardiac Enzymes: No results for input(s): CKTOTAL, CKMB, CKMBINDEX, TROPONINI in the last 168 hours. BNP: Invalid input(s): POCBNP CBG: No results for input(s): GLUCAP in the last 168 hours.  Radiological Exams on Admission:  Ir Gastrostomy Tube  11/06/2014   CLINICAL DATA:  Metastatic lung carcinoma, weight loss, loss of appetite, dysphagia and poor nutrition. The patient presents for percutaneous gastrostomy tube placement.  EXAM: PERCUTANEOUS GASTROSTOMY TUBE PLACEMENT  ANESTHESIA/SEDATION: 5.0 mg IV Versed; 50 mcg IV Fentanyl.  Total Moderate Sedation Time  30 minutes.  CONTRAST:  45m OMNIPAQUE IOHEXOL 300 MG/ML  SOLN  MEDICATIONS: 1 g IV vancomycin. Vancomycin was given within two hours of incision. Vancomycin was given due to an antibiotic allergy.  FLUOROSCOPY TIME:  11 minutes.  PROCEDURE: The procedure, risks, benefits, and alternatives were explained to the patient. Questions regarding the procedure were encouraged and answered. The patient understands and consents to the procedure.  A time-out was performed prior to the procedure. A 5-French catheter was then advanced through the the patient's mouth under fluoroscopy into the esophagus and to the level of the stomach. This catheter was used to insufflate the stomach with air under fluoroscopy.  The abdominal wall was prepped with Betadine in a sterile fashion, and a sterile drape was applied covering the operative field. A sterile gown and sterile gloves were used for the  procedure. Local anesthesia was provided with 1% Lidocaine.  A skin incision was made in the upper abdominal wall. Under fluoroscopy, an 18 gauge trocar needle was advanced into the stomach. Contrast injection was performed to confirm intraluminal position of the needle tip. A single T tack was then deployed in the lumen of the stomach. This was brought up to tension at the skin surface.  Over a guidewire, a 9-French sheath was advanced into the lumen of the stomach. The wire was left in place as a safety wire. A loop snare device from a percutaneous gastrostomy kit was then advanced into the stomach.  A floppy guide wire was advanced through the orogastric catheter under fluoroscopy in the stomach. The loop snare advanced through the percutaneous gastric access was used to snare the guide wire. This allowed withdrawal of the loop snare out of the patient's mouth by retraction of the orogastric catheter and wire.  A 20-French bumper retention gastrostomy tube was looped around the snare device. It was then pulled back through the patient's mouth. The retention bumper was brought up to the anterior gastric wall. The T tack suture was cut at the skin. The exiting gastrostomy tube was cut to appropriate length and a feeding adapter applied. The catheter was injected with contrast material to confirm position and a fluoroscopic spot image saved. The tube was then flushed with saline. A dressing was applied over the gastrostomy exit site.  COMPLICATIONS: None.  FINDINGS: The stomach distended well with air allowing safe placement of the gastrostomy tube. After placement, the tip of the gastrostomy tube lies in the body of the stomach.  IMPRESSION: Percutaneous gastrostomy with placement of a 20-French bumper retention tube in the body of the stomach. This tube can be used for percutaneous feeds beginning in 24 hours after placement.   Electronically Signed   By: GAletta EdouardM.D.   On: 11/06/2014 13:01  Dg  Esophagus  10/31/2014   CLINICAL DATA:  Dysphagia  EXAM: ESOPHOGRAM/BARIUM SWALLOW  TECHNIQUE: Single contrast examination was performed using  thin barium.  FLUOROSCOPY TIME:  Radiation Exposure Index (as provided by the fluoroscopic device): 36 Gy per sq cm  If the device does not provide the exposure index:  Fluoroscopy Time:  1 minutes 42 seconds  Number of Acquired Images:  COMPARISON:  CT chest of 08/06/2014  FINDINGS: The patient was not able to stand for an adequate length of time, and therefore a lateral series with rapid sequence spot spot films of the cervical esophagus was performed. The swallowing mechanism is unremarkable. No aspiration is seen. There are mild tertiary contractions in the mid and distal esophagus. No definite hiatal hernia is seen. No gastroesophageal reflux is demonstrated.  IMPRESSION: Mild tertiary contractions in the mid and distal esophagus. No hiatal hernia. No definite reflux.   Electronically Signed   By: Ivar Drape M.D.   On: 10/31/2014 10:39   Dg Chest Port 1 View  11/18/2014   CLINICAL DATA:  Hypotension and fever/chills.  EXAM: PORTABLE CHEST - 1 VIEW  COMPARISON:  09/20/2014  FINDINGS: Dense airspace disease in the right lower lobe. Small right pleural effusion. No evidence of cavitation.  Clear left lung.  Normal heart size and stable aortic contours. Fullness of the right peritracheal stripe most recently evaluated by chest CT 08/06/2014.  Biliary stent pneumobilia.  Right IJ porta catheter with tip at the upper SVC.  IMPRESSION: Right lower lobe pneumonia with small effusion.   Electronically Signed   By: Monte Fantasia M.D.   On: 11/18/2014 14:02    *I have personally reviewed the images above*  EKG: Independently reviewed. Rate 90, normal sinus rhythm, no acute ST-T wave changes suggestive of ischemia   Assessment/Plan Principal Problem:   Sepsis/ SIRS, HCAP (healthcare associated pneumonia): Patient meets sirs criteria with leukocytosis, hypotension,  tachycardia, source of infection, pneumonia - Admit to stepdown place on aggressive IV fluid hydration, follow blood cultures, pro-calcitonin, urine legionella antigen, urine strep antigen - Placed on IV vancomycin, aztreonam  Active Problems:   Non-small cell lung cancer - Currently undergoing chemotherapy, added Dr. [name] to care teams, patient was sent over from [facility]    Dehydration, Weight loss - Placed on IV fluid hydration, restart tube feeding    Hypotension with history of hypertension - Hold atenolol, continue IV fluid hydration    Dysphagia - Patient has a PEG tube recently placed, will continue to feedings Osmolite 1.5 - He also wants to try whatever he can eat, does not have much appetite, hence will continue soft regular diet.   DVT prophylaxis: Lovenox  CODE STATUS: Discussed in detail with the patient, he opted to be DO NOT RESUSCITATE status  Family Communication: Admission, patients condition and plan of care including tests being ordered have been discussed with the patient and  Wife who indicates understanding and agree with the plan and Code Status  Disposition plan: Further plan will depend as patient's clinical course evolves and further radiologic and laboratory data become available.   Time Spent on Admission: 84mns   RAI,RIPUDEEP M.D. [facility] 11/18/2014, 3:01 PM Pager: [phone] If 7PM-7AM, please contact night-coverage [email or web address] Password [email or web address]

## 2014-11-18 NOTE — Assessment & Plan Note (Signed)
Patient received his last cycle of Nivolumab immunotherapy on 11/07/2014.  Patient is scheduled to return on 11/22/2014 for labs, visit, and his next cycle of immunotherapy.

## 2014-11-18 NOTE — ED Provider Notes (Signed)
CSN: [id number]     Arrival date & time 11/18/14  1253 History   First MD Initiated Contact with Patient 11/18/14 1309     Chief Complaint  Patient presents with  . Hypotension  . Chills     (Consider location/radiation/quality/duration/timing/severity/associated sxs/prior Treatment) HPI Comments: 69 year old male with history of bronchogenic left lung cancer, pneumonia, renal failure, pancytopenia, biliary obstruction, radiation and chemotherapy presents with worsening weakness and low blood pressure. Patient sent over from the cancer center for blood pressure in the 25K systolic. Patient said guardedly worsening symptoms the past few days no current antibodies. Mild cough no fever home. Patient received chemotherapy last on Thursday for third round. Patient had PEG tube placed on the 10th mild discharge from the area no significant shunting redness. Patient has been followed for mild anemia. Patient's heart rate was elevated 120s last night. Patient follows local oncologist. Dr. [name]  The history is provided by the patient, medical records and the spouse.    Past Medical History  Diagnosis Date  . Hypertension   . Arthritis RIGHT SHOULDER  . Immature cataract BILATERAL  . Non-small cell lung cancer DX SEPT 2010  W/ CHEMORADIATION AT THAT TIME -- NOW  W/ METS--  CURRENTLY ON MAINTENANCE CHEMO TX  EVERY 30 DAYS    ONCOLOGIST- DR [facility]  . Acute meniscal tear of knee RIGHT KNEE  . BPH (benign prostatic hypertrophy)   . Dizziness and giddiness 04/23/2014  . Tremor 04/23/2014    Right hand  . HOH (hard of hearing)     bilateral hearing aids  . Radiation 01/08/09-01/21/09    Mediastinum 30 Gy x 12 fractions  . Radiation 08/19/14-09/04/14    Palliative RT Porta hepatic LN 30 Gy   Past Surgical History  Procedure Laterality Date  . 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 Rotator cuff repair  2008    LEFT SHOULDER   . Bilateral ear drum surgery  1960'S  . Orif left ring finger  and revascularization of radial side  02-16-2007    DEGLOVING INJURY  . Knee arthroscopy  12/21/2011    Procedure: ARTHROSCOPY KNEE;  Surgeon: Tobi Bastos, MD;  Location: [facility];  Service: Orthopedics;  Laterality: Right;  WITH MEDIAL MENISECTOMY  . Cataract extraction Bilateral   . Tonsillectomy      as child  . Ovarian cyst surgery      removed from left jaw area- benign  . Ovarian cyst surgery       NOTE; pt unaware of why this is in his chart  . Shoulder open rotator cuff repair Right 06/05/2014    Procedure: RIGHT ROTATOR CUFF REPAIR SHOULDER OPEN;  Surgeon: Latanya Maudlin, MD;  Location: [facility];  Service: Orthopedics;  Laterality: Right;  . Ercp N/A 08/09/2014    Procedure: ENDOSCOPIC RETROGRADE CHOLANGIOPANCREATOGRAPHY (ERCP);  Surgeon: Carol Ada, MD;  Location: [facility];  Service: Endoscopy;  Laterality: N/A;  . Eus N/A 08/09/2014    Procedure: UPPER ENDOSCOPIC ULTRASOUND (EUS) RADIAL;  Surgeon: Carol Ada, MD;  Location: [facility];  Service: Endoscopy;  Laterality: N/A;  . Fine needle aspiration N/A 08/09/2014    Procedure: FINE NEEDLE ASPIRATION (FNA) LINEAR;  Surgeon: Carol Ada, MD;  Location: [facility];  Service: Endoscopy;  Laterality: N/A;  . Esophagogastroduodenoscopy (egd) with propofol N/A 08/09/2014    Procedure: ESOPHAGOGASTRODUODENOSCOPY (EGD) WITH PROPOFOL;  Surgeon: Carol Ada, MD;  Location: [facility];  Service: Endoscopy;  Laterality: N/A;   Family History  Problem Relation Age of Onset  . Colon cancer    . Heart attack    . Cancer    . Hypertension    . Hyperlipidemia    . Congestive Heart Failure Mother   . Cancer Sister   . Cancer Maternal Grandfather   . Colon cancer Father   . Heart attack Brother    Social History  Substance Use Topics  . Smoking status: Former Smoker -- 2.00 packs/day for 20 years    Types: Cigarettes    Quit date: 03/17/1981   . Smokeless tobacco: Never Used  . Alcohol Use: 3.5 oz/week    7 Standard drinks or equivalent per week     Comment: beer daily    Review of Systems  Constitutional: Positive for appetite change. Negative for fever and chills.  HENT: Negative for congestion.   Eyes: Negative for visual disturbance.  Respiratory: Positive for cough. Negative for shortness of breath.   Cardiovascular: Negative for chest pain.  Gastrointestinal: Positive for nausea. Negative for vomiting and abdominal pain.  Genitourinary: Negative for dysuria and flank pain.  Musculoskeletal: Negative for back pain, neck pain and neck stiffness.  Skin: Negative for rash.  Neurological: Positive for weakness and light-headedness. Negative for headaches.      Allergies  Augmentin; Carboplatin; Levaquin; Percocet; and Zolpidem tartrate  Home Medications   Prior to Admission medications   Medication Sig Start Date End Date Taking? Authorizing Provider  atenolol (TENORMIN) 100 MG tablet Take 50 mg by mouth daily.    Yes Historical Provider, MD  FeFum-FePoly-FA-B Cmp-C-Biot (INTEGRA PLUS) CAPS Take 1 capsule by mouth every morning. 10/24/14  Yes [name], MD  ferrous sulfate 325 (65 FE) MG tablet Take 1 tablet (325 mg total) by mouth 3 (three) times daily with meals. 11/01/14  Yes [name], MD  HYDROmorphone (DILAUDID) 4 MG tablet Take 4 mg by mouth every 6 (six) hours as needed for severe pain.  11/06/14  Yes Historical Provider, MD  metoCLOPramide (REGLAN) 10 MG tablet Take 1 tablet (10 mg total) by mouth every 8 (eight) hours as needed for nausea. 11/15/14  Yes Susanne Borders, NP  Multiple Vitamin (MULTIVITAMIN) tablet Take 1 tablet by mouth every morning.    Yes Historical Provider, MD  MYRBETRIQ 50 MG TB24 tablet Take 50 mg by mouth every evening.  01/09/14  Yes Historical Provider, MD  Nutritional Supplements (FEEDING SUPPLEMENT, OSMOLITE 1.5 CAL,) LIQD Begin Osmolite 1.5 at 20 ml/hr continuous infusion  via PEG for 12 hours overnight from 8 pm to 8 am. Increase 10 cc daily to initial goal of 95 cc/hr for 15 hours daily as tolerated.  Flush feeding tube with 240 cc free water 6 times daily. This provides 100% estimated needs. Please send feeding pump and instruct patient on how to use pump. Send TF supplies. Do not send formula. 11/07/14  Yes [name], MD  OLANZapine (ZYPREXA) 10 MG tablet TAKE 1 TABLET(10 MG) BY MOUTH AT BEDTIME 11/15/14  Yes Susanne Borders, NP  omeprazole (PRILOSEC) 40 MG capsule Take 1 capsule (40 mg total) by mouth every evening. 11/07/14  Yes Adrena E Johnson, PA-C  ondansetron (ZOFRAN) 8 MG tablet Take 1 tablet (8 mg total) by mouth every 8 (eight) hours as needed for nausea or vomiting. 10/01/14  Yes Susanne Borders, NP  prochlorperazine (COMPAZINE) 10 MG tablet Take 1 tablet (10 mg total) by mouth every 6 (  six) hours as needed for nausea or vomiting. 10/15/14  Yes [name], MD  Sennosides-Docusate Sodium (SENOKOT S PO) Take 2 tablets by mouth daily as needed (constipation).    Yes Historical Provider, MD  triamcinolone (NASACORT ALLERGY 24HR) 55 MCG/ACT AERO nasal inhaler Place 1 spray into the nose daily as needed (congestion.).   Yes Historical Provider, MD  LORazepam (ATIVAN) 0.5 MG tablet Take 1 tablet (0.5 mg total) by mouth every 6 (six) hours as needed (nausea). Patient not taking: Reported on 11/04/2014 10/01/14   Susanne Borders, NP   BP 94/63 mmHg  Pulse 89  Temp(Src) 98.2 F (36.8 C) (Oral)  Resp 21  Ht '5\' 8"'$  (1.727 m)  Wt 105 lb (47.628 kg)  BMI 15.97 kg/m2  SpO2 94% Physical Exam  Constitutional: He is oriented to person, place, and time. He appears well-developed.  HENT:  Head: Normocephalic and atraumatic.  Dry mucous membranes  Eyes: Right eye exhibits no discharge. Left eye exhibits no discharge.  Neck: Normal range of motion. Neck supple. No tracheal deviation present.  Cardiovascular: Normal rate and regular rhythm.   Pulmonary/Chest: Effort  normal. He has rales (few bilateral).  Abdominal: Soft. He exhibits no distension. There is no tenderness. There is no guarding.  Musculoskeletal: He exhibits no edema.  Neurological: He is alert and oriented to person, place, and time. No cranial nerve deficit (Gen. weakness).  Skin: Skin is warm. No rash noted. There is pallor.  Psychiatric: He has a normal mood and affect.  Nursing note and vitals reviewed.   ED Course  Procedures (including critical care time) CRITICAL CARE Performed by: Mariea Clonts   Total critical care time: 35 min  Critical care time was exclusive of separately billable procedures and treating other patients.  Critical care was necessary to treat or prevent imminent or life-threatening deterioration.  Critical care was time spent personally by me on the following activities: development of treatment plan with patient and/or surrogate as well as nursing, discussions with consultants, evaluation of patient's response to treatment, examination of patient, obtaining history from patient or surrogate, ordering and performing treatments and interventions, ordering and review of laboratory studies, ordering and review of radiographic studies, pulse oximetry and re-evaluation of patient's condition.    EMERGENCY DEPARTMENT [place] CARDIAC EXAM "Study: Limited Ultrasound of the heart and pericardium"  INDICATIONS:Hypotension Multiple views of the heart and pericardium were obtained in real-time with a multi-frequency probe.  PERFORMED EX:NTZGYF  IMAGES ARCHIVED?: Yes  FINDINGS: No pericardial effusion, Normal contractility, IVC flat and Tamponade physiology absent  LIMITATIONS:  Body habitus  VIEWS USED: Subcostal 4 chamber, Parasternal long axis, Parasternal short axis, Apical 4 chamber  and Inferior Vena Cava  INTERPRETATION: Cardiac activity present, Cardiac tamponade absent, Probable low CVP and Normal contractility  CPT Code: [phone] (limited  transthoracic cardiac)  Labs Review Labs Reviewed  CULTURE, BLOOD (ROUTINE X 2)  CULTURE, BLOOD (ROUTINE X 2)  URINE CULTURE  URINALYSIS, ROUTINE W REFLEX MICROSCOPIC (NOT AT [facility])  I-STAT CG4 LACTIC ACID, ED    Imaging Review Dg Chest Port 1 View  11/18/2014   CLINICAL DATA:  Hypotension and fever/chills.  EXAM: PORTABLE CHEST - 1 VIEW  COMPARISON:  09/20/2014  FINDINGS: Dense airspace disease in the right lower lobe. Small right pleural effusion. No evidence of cavitation.  Clear left lung.  Normal heart size and stable aortic contours. Fullness of the right peritracheal stripe most recently evaluated by chest CT 08/06/2014.  Biliary stent pneumobilia.  Right IJ porta catheter with tip at the upper SVC.  IMPRESSION: Right lower lobe pneumonia with small effusion.   Electronically Signed   By: Monte Fantasia M.D.   On: 11/18/2014 14:02   I have personally reviewed and evaluated these images and lab results as part of my medical decision-making.   EKG Interpretation None      MDM   Final diagnoses:  Other specified hypotension  Sepsis, due to unspecified organism  HCAP (healthcare-associated pneumonia)  Anemia due to other cause   Patient presents in the clinic with hypotension and general fatigue and cough. Concern for infectious source with chills last night and immunosuppression from chemotherapy. Sepsis workup initiated, recheck patient improved blood pressure in the 90s. IV fluid bolus ordered. Chest x-ray reviewed concerning for pneumonia, brought antibodies ordered. Plan for admission to the hospital.  The patients results and plan were reviewed and discussed.   Any x-rays performed were independently reviewed by myself.   Differential diagnosis were considered with the presenting HPI.  Medications  sodium chloride 0.9 % bolus 1,000 mL (1,000 mLs Intravenous New Bag/Given 11/18/14 1412)    Followed by  sodium chloride 0.9 % bolus 500 mL (not administered)  0.9 %   sodium chloride infusion (1,000 mLs Intravenous New Bag/Given 11/18/14 1434)  vancomycin (VANCOCIN) IVPB 1000 mg/200 mL premix (not administered)  aztreonam (AZACTAM) 2 g in dextrose 5 % 50 mL IVPB (not administered)  vancomycin (VANCOCIN) IVPB 750 mg/150 ml premix (not administered)  sodium chloride 0.9 % bolus 1,000 mL (not administered)  0.9 %  sodium chloride infusion (not administered)  aztreonam (AZACTAM) 2 g in dextrose 5 % 50 mL IVPB (2 g Intravenous New Bag/Given 11/18/14 1420)    Filed Vitals:   11/18/14 1345 11/18/14 1400 11/18/14 1415 11/18/14 1430  BP: 90/58 89/62 102/57 94/63  Pulse: 91 91 89 89  Temp:      TempSrc:      Resp: '31 23 21 21  '$ Height:      Weight:      SpO2: 93% 93% 93% 94%    Final diagnoses:  Other specified hypotension  Sepsis, due to unspecified organism  HCAP (healthcare-associated pneumonia)  Anemia due to other cause    Admission/ observation were discussed with the admitting physician, patient and/or family and they are comfortable with the plan.    Elnora Morrison, MD 11/18/14 [phone]

## 2014-11-18 NOTE — Assessment & Plan Note (Signed)
Patient is complaining of minimal appetite and continued weight loss.  Patient states that he has lost approximately 30 pounds within the last 2 months.  Patient was prescribed Marinol for appetite stimulation; with only minimal effectiveness.   Patient now has a PEG tube; but states that he is intolerant of even a full can of his tube feedings; because he feels bloated. Prescribed Reglan last week for pt to try.   Patient plans to meet with the nutritionist here at the [place] today.  Have advised patient to consider decreasing the amount of tube feedings given per hour; but also receiving the tube feeding infusion for a longer period of time each day.  Patient was advised to continue pushing protein in his diet and fluids.

## 2014-11-18 NOTE — ED Notes (Signed)
Pt c/o chills and dehydration x "a while" and hypotension starting this morning.  Denies pain.  Pt reports taking "half an atenolol" this morning, due to tachycardia and increased BP.  Hx of Lung CA and anemia.  Pt reports that he was supposed to get a transfusion today.

## 2014-11-18 NOTE — Addendum Note (Signed)
Addended by: [name] on: 11/18/2014 07:22 PM   Modules accepted: Level of Service

## 2014-11-18 NOTE — Patient Instructions (Signed)

## 2014-11-18 NOTE — Assessment & Plan Note (Signed)
Patient continues to complain of some chronic nausea; and states he vomited once last night.  Patient already has Zofran, Compazine, and Ativan at home for nausea.  He was also initiated on Zyprexa to help with nausea over this past weekend as well.

## 2014-11-18 NOTE — Progress Notes (Signed)
CRITICAL VALUE ALERT  Critical value received:  Hgb 6.4 Date of notification:  11/18/14  Time of notification:  1940  Critical value read back:Yes.    Nurse who received alert:  S. Halvor Behrend, RN from Jacklynn Barnacle, RN at change of shift  MD notified (1st page):  K. Schorr  Time of first page:  [phone]  MD notified (2nd page):  Time of second page:  Responding MD:  Lamar Blinks  Time MD responded:  [phone]

## 2014-11-19 LAB — RETICULOCYTES
RBC.: 2.55 MIL/uL — AB (ref 4.22–5.81)
RETIC COUNT ABSOLUTE: 51 10*3/uL (ref 19.0–186.0)
Retic Ct Pct: 2 % (ref 0.4–3.1)

## 2014-11-19 LAB — BASIC METABOLIC PANEL
Anion gap: 6 (ref 5–15)
BUN: 23 mg/dL — AB (ref 6–20)
CHLORIDE: 111 mmol/L (ref 101–111)
CO2: 21 mmol/L — ABNORMAL LOW (ref 22–32)
CREATININE: 0.87 mg/dL (ref 0.61–1.24)
Calcium: 8.1 mg/dL — ABNORMAL LOW (ref 8.9–10.3)
Glucose, Bld: 132 mg/dL — ABNORMAL HIGH (ref 65–99)
POTASSIUM: 3.7 mmol/L (ref 3.5–5.1)
SODIUM: 138 mmol/L (ref 135–145)

## 2014-11-19 LAB — FERRITIN: Ferritin: 1567 ng/mL — ABNORMAL HIGH (ref 24–336)

## 2014-11-19 LAB — CBC
HEMATOCRIT: 24.4 % — AB (ref 39.0–52.0)
Hemoglobin: 7.9 g/dL — ABNORMAL LOW (ref 13.0–17.0)
MCH: 31 pg (ref 26.0–34.0)
MCHC: 32.4 g/dL (ref 30.0–36.0)
MCV: 95.7 fL (ref 78.0–100.0)
PLATELETS: 236 10*3/uL (ref 150–400)
RBC: 2.55 MIL/uL — AB (ref 4.22–5.81)
RDW: 16.6 % — AB (ref 11.5–15.5)
WBC: 12.2 10*3/uL — AB (ref 4.0–10.5)

## 2014-11-19 LAB — HIV ANTIBODY (ROUTINE TESTING W REFLEX): HIV SCREEN 4TH GENERATION: NONREACTIVE

## 2014-11-19 LAB — PREPARE RBC (CROSSMATCH)

## 2014-11-19 LAB — EXPECTORATED SPUTUM ASSESSMENT W REFEX TO RESP CULTURE

## 2014-11-19 LAB — FOLATE: FOLATE: 18.5 ng/mL (ref >5.9)

## 2014-11-19 LAB — VITAMIN B12: VITAMIN B 12: 443 pg/mL (ref 180–914)

## 2014-11-19 LAB — HEMOGLOBIN AND HEMATOCRIT, BLOOD
HCT: 28.1 % — ABNORMAL LOW (ref 39.0–52.0)
Hemoglobin: 9.1 g/dL — ABNORMAL LOW (ref 13.0–17.0)

## 2014-11-19 LAB — IRON AND TIBC: Iron: 23 ug/dL — ABNORMAL LOW (ref 45–182)

## 2014-11-19 LAB — LACTATE DEHYDROGENASE: LDH: 114 U/L (ref 98–192)

## 2014-11-19 MED ORDER — VANCOMYCIN HCL 500 MG IV SOLR
500.0000 mg | Freq: Two times a day (BID) | INTRAVENOUS | Status: DC
  Administered 2014-11-19 – 2014-11-22 (×6): 500 mg via INTRAVENOUS
  Filled 2014-11-19 (×7): qty 500

## 2014-11-19 MED ORDER — VITAL 1.5 CAL PO LIQD
1000.0000 mL | ORAL | Status: DC
  Administered 2014-11-19: 1000 mL
  Filled 2014-11-19 (×2): qty 1000

## 2014-11-19 MED ORDER — SODIUM CHLORIDE 0.9 % IV SOLN
Freq: Once | INTRAVENOUS | Status: AC
  Administered 2014-11-19: 08:00:00 via INTRAVENOUS

## 2014-11-19 MED ORDER — ACETAMINOPHEN 500 MG PO TABS
500.0000 mg | ORAL_TABLET | Freq: Four times a day (QID) | ORAL | Status: AC | PRN
  Administered 2014-11-19: 500 mg via ORAL
  Filled 2014-11-19: qty 1

## 2014-11-19 MED ORDER — SODIUM CHLORIDE 0.9 % IV SOLN
1000.0000 mL | INTRAVENOUS | Status: DC
  Administered 2014-11-19 – 2014-11-20 (×2): 1000 mL via INTRAVENOUS

## 2014-11-19 MED ORDER — VITAL 1.5 CAL PO LIQD
1200.0000 mL | ORAL | Status: DC
  Filled 2014-11-19: qty 2000

## 2014-11-19 NOTE — Progress Notes (Signed)
PT Cancellation Note  Patient Details Name: Rozell Theiler MRN: [id number] DOB: 11/10/1945   Cancelled Treatment:    Reason Eval/Treat Not Completed: Medical issues which prohibited therapy (patient has been sleeping all day per RN. check back in AM.)   Claretha Cooper 11/19/2014, 5:03 PM Tresa Endo PT [phone]

## 2014-11-19 NOTE — Progress Notes (Signed)
Dr. Tana Coast paged about change in Neuro status. Wife at bedside.    11/19/14 1145  Charting Type  Charting Type Reassessment  Neurological  Neuro (WDL) X  Level of Consciousness Alert  Orientation Level Oriented X4  Cognition Follows commands  Speech Slurred/Dysarthria  Motor Function/Sensation Assessment Grip;Plantar flexion  R Hand Grip Present;Moderate  L Hand Grip Present;Moderate   R Foot Plantar Flexion Present;Moderate  L Foot Plantar Flexion Present;Moderate  Neuro Symptoms Drowsiness  Glasgow Coma Scale  Eye Opening 4  Best Verbal Response (NON-intubated) 5  Best Motor Response 6  [place] Coma Scale Score 15

## 2014-11-19 NOTE — Progress Notes (Signed)
[facility]                                                                              Patient Demographics  [name], is a 69 y.o. male, DOB - Mar 30, 1945, IRC:[id number]  Admit date - 11/18/2014   Admitting Physician Ripudeep Krystal Eaton, MD  Outpatient Primary MD for the patient is Nathan Melter, MD  LOS - 1   Chief Complaint  Patient presents with  . Hypotension  . Chills       Brief HPI   Patient is a 69 year old male with lung cancer- non-small cell, diagnosed in September 2010, currently undergoing chemotherapy, hypertension, anorexia, dysphagia recently had PEG tube placed on 11/06/2014 presented from the [facility] for hypotension. History was obtained from the patient and his wife. Patient reported that He has not been eating well in the last few months, had recent PEG tube placed on 11/06/2014. He started having coughing with clear sputum in the last 2-3 days, worsened overnight with shaking chills, one episode of vomiting. The patient had received chemotherapy last week for the third round. Heart rate was also elevated in 120s. Patient's wife had given him off of his atenolol tabs due to elevated heart rate at 4 AM this morning. Patient had reinitiated his IV fluid hydration at home over the past weekend. At the cancer Center BP was 70/56. ED workup showed BUN 29, creatinine 1.3, sodium 136, WBCs 22.1, hemoglobin 8.1, platelets 335, lactic acid 1.9. Chest x-ray showed right lower lobe pneumonia   Assessment & Plan   Principal Problem:  Sepsis/ SIRS, HCAP (healthcare associated pneumonia): Patient meets sirs criteria with leukocytosis, hypotension, tachycardia, source of infection, pneumonia, possibly aspiration - BP was stable, continue gentle IV fluid hydration, pro-calcitonin 22.9, lactic acid 1.96, follow blood cultures - Urine strep antigen negative, follow urine Legionella antigen - Continue broad-spectrum  IV vancomycin,  aztreonam  Anemia - No obvious GI bleeding or any epistaxis or hemoptysis - Obtain anemia panel, stool occult test, hemolysis panel, transfuse total 2 units packed RBCs, was given 1 unit last night, hemoglobin appropriately improved to 7.9   Non-small cell lung cancer - Currently undergoing chemotherapy, added Dr. [name] to care teams, patient was sent over from [facility]   Dehydration, Weight loss - Placed on IV fluid hydration, restart tube feeding   Hypotension with history of hypertension - BP now stable, continue to hold atenolol,  continue gentle hydration   Dysphagia - Patient has a PEG tube recently placed, will continue TF Osmolite 1.5 - He also wants to try whatever he can eat, does not have much appetite, hence will continue soft regular diet.  Nutrition consult placed.    Code Status:  DO NOT RESUSCITATE Family Communication: Discussed in detail with the patient, all imaging results, lab results explained to the patient    Disposition Plan:  Follow in step down today  Time Spent in minutes  25 minutes  Procedures  Chest x-ray  Consults   None  DVT Prophylaxis SCD's  Medications  Scheduled Meds: . sodium chloride   Intravenous STAT  . sodium chloride  Intravenous Once  . aztreonam  2 g Intravenous 3 times per day  . ferrous sulfate  325 mg Oral TID WC  . mirabegron ER  50 mg Oral QPM  . OLANZapine  10 mg Oral QHS  . pantoprazole  40 mg Oral Daily  . vancomycin  750 mg Intravenous Q24H   Continuous Infusions: . sodium chloride Stopped (11/19/14 0832)  . feeding supplement (OSMOLITE 1.5 CAL) 1,000 mL (11/19/14 0700)   PRN Meds:.fluticasone, HYDROmorphone, metoCLOPramide, prochlorperazine   Antibiotics   Anti-infectives    Start     Dose/Rate Route Frequency Ordered Stop   11/19/14 1600  vancomycin (VANCOCIN) IVPB 750 mg/150 ml premix     750 mg 150 mL/hr over 60 Minutes Intravenous Every 24 hours 11/18/14 1425     11/19/14  0000  aztreonam (AZACTAM) 2 g in dextrose 5 % 50 mL IVPB  Status:  Discontinued     2 g 100 mL/hr over 30 Minutes Intravenous Every 8 hours 11/18/14 1424 11/18/14 1607   11/18/14 2200  aztreonam (AZACTAM) 2 g in dextrose 5 % 50 mL IVPB     2 g 100 mL/hr over 30 Minutes Intravenous 3 times per day 11/18/14 1601 11/26/14 2159   11/18/14 1415  aztreonam (AZACTAM) 2 g in dextrose 5 % 50 mL IVPB     2 g 100 mL/hr over 30 Minutes Intravenous  Once 11/18/14 1410 11/18/14 1450   11/18/14 1415  vancomycin (VANCOCIN) IVPB 1000 mg/200 mL premix     1,000 mg 200 mL/hr over 60 Minutes Intravenous  Once 11/18/14 1410 11/18/14 1605        Subjective:   Georgie Haque was seen and examined today.  Feeling somewhat better today, afebrile, dry coughing, no hematochezia or melena or any obvious GI bleeding. Patient denies dizziness, chest pain, shortness of breath, abdominal pain, N/V/D/C, new weakness, numbess, tingling. No acute events overnight.    Objective:   Blood pressure 149/99, pulse 99, temperature 98.3 F (36.8 C), temperature source Oral, resp. rate 19, height 5' 8"  (1.727 m), weight 50 kg (110 lb 3.7 oz), SpO2 97 %.  Wt Readings from Last 3 Encounters:  11/19/14 50 kg (110 lb 3.7 oz)  11/14/14 46.04 kg (101 lb 8 oz)  11/07/14 48.353 kg (106 lb 9.6 oz)     Intake/Output Summary (Last 24 hours) at 11/19/14 1037 Last data filed at 11/19/14 1020  Gross per 24 hour  Intake 4160.67 ml  Output   2340 ml  Net 1820.67 ml    Exam  General: Alert and oriented x 3, NAD  HEENT:  PERRLA, EOMI, Anicteric Sclera, mucous membranes moist.   Neck: Supple, no JVD, no masses  CVS: S1 S2 auscultated, no rubs, murmurs or gallops. Regular rate and rhythm.  Respiratory: Decreased breath sounds at the bases   Abdomen: Soft, nontender, nondistended, + bowel sounds, +PEG  Ext: no cyanosis clubbing or edema  Neuro: AAOx3, Cr N's II- XII. Strength 5/5 upper and lower extremities  bilaterally  Skin: No rashes  Psych: Normal affect and demeanor, alert and oriented x3    Data Review   Micro Results Recent Results (from the past 240 hour(s))  MRSA PCR Screening     Status: None   Collection Time: 11/18/14  4:11 PM  Result Value Ref Range Status   MRSA by PCR NEGATIVE NEGATIVE Final    Comment:        The GeneXpert MRSA Assay (FDA approved for NASAL specimens only), is  one component of a comprehensive MRSA colonization surveillance program. It is not intended to diagnose MRSA infection nor to guide or monitor treatment for MRSA infections.     Radiology Reports Ir Gastrostomy Tube  11/06/2014   CLINICAL DATA:  Metastatic lung carcinoma, weight loss, loss of appetite, dysphagia and poor nutrition. The patient presents for percutaneous gastrostomy tube placement.  EXAM: PERCUTANEOUS GASTROSTOMY TUBE PLACEMENT  ANESTHESIA/SEDATION: 5.0 mg IV Versed; 50 mcg IV Fentanyl.  Total Moderate Sedation Time  30 minutes.  CONTRAST:  77m OMNIPAQUE IOHEXOL 300 MG/ML  SOLN  MEDICATIONS: 1 g IV vancomycin. Vancomycin was given within two hours of incision. Vancomycin was given due to an antibiotic allergy.  FLUOROSCOPY TIME:  11 minutes.  PROCEDURE: The procedure, risks, benefits, and alternatives were explained to the patient. Questions regarding the procedure were encouraged and answered. The patient understands and consents to the procedure.  A time-out was performed prior to the procedure. A 5-French catheter was then advanced through the the patient's mouth under fluoroscopy into the esophagus and to the level of the stomach. This catheter was used to insufflate the stomach with air under fluoroscopy.  The abdominal wall was prepped with Betadine in a sterile fashion, and a sterile drape was applied covering the operative field. A sterile gown and sterile gloves were used for the procedure. Local anesthesia was provided with 1% Lidocaine.  A skin incision was made in the upper  abdominal wall. Under fluoroscopy, an 18 gauge trocar needle was advanced into the stomach. Contrast injection was performed to confirm intraluminal position of the needle tip. A single T tack was then deployed in the lumen of the stomach. This was brought up to tension at the skin surface.  Over a guidewire, a 9-French sheath was advanced into the lumen of the stomach. The wire was left in place as a safety wire. A loop snare device from a percutaneous gastrostomy kit was then advanced into the stomach.  A floppy guide wire was advanced through the orogastric catheter under fluoroscopy in the stomach. The loop snare advanced through the percutaneous gastric access was used to snare the guide wire. This allowed withdrawal of the loop snare out of the patient's mouth by retraction of the orogastric catheter and wire.  A 20-French bumper retention gastrostomy tube was looped around the snare device. It was then pulled back through the patient's mouth. The retention bumper was brought up to the anterior gastric wall. The T tack suture was cut at the skin. The exiting gastrostomy tube was cut to appropriate length and a feeding adapter applied. The catheter was injected with contrast material to confirm position and a fluoroscopic spot image saved. The tube was then flushed with saline. A dressing was applied over the gastrostomy exit site.  COMPLICATIONS: None.  FINDINGS: The stomach distended well with air allowing safe placement of the gastrostomy tube. After placement, the tip of the gastrostomy tube lies in the body of the stomach.  IMPRESSION: Percutaneous gastrostomy with placement of a 20-French bumper retention tube in the body of the stomach. This tube can be used for percutaneous feeds beginning in 24 hours after placement.   Electronically Signed   By: GAletta EdouardM.D.   On: 11/06/2014 13:01   Dg Esophagus  10/31/2014   CLINICAL DATA:  Dysphagia  EXAM: ESOPHOGRAM/BARIUM SWALLOW  TECHNIQUE: Single  contrast examination was performed using  thin barium.  FLUOROSCOPY TIME:  Radiation Exposure Index (as provided by the fluoroscopic device): 36 Gy  per sq cm  If the device does not provide the exposure index:  Fluoroscopy Time:  1 minutes 42 seconds  Number of Acquired Images:  COMPARISON:  CT chest of 08/06/2014  FINDINGS: The patient was not able to stand for an adequate length of time, and therefore a lateral series with rapid sequence spot spot films of the cervical esophagus was performed. The swallowing mechanism is unremarkable. No aspiration is seen. There are mild tertiary contractions in the mid and distal esophagus. No definite hiatal hernia is seen. No gastroesophageal reflux is demonstrated.  IMPRESSION: Mild tertiary contractions in the mid and distal esophagus. No hiatal hernia. No definite reflux.   Electronically Signed   By: Ivar Drape M.D.   On: 10/31/2014 10:39   Dg Chest Port 1 View  11/18/2014   CLINICAL DATA:  Hypotension and fever/chills.  EXAM: PORTABLE CHEST - 1 VIEW  COMPARISON:  09/20/2014  FINDINGS: Dense airspace disease in the right lower lobe. Small right pleural effusion. No evidence of cavitation.  Clear left lung.  Normal heart size and stable aortic contours. Fullness of the right peritracheal stripe most recently evaluated by chest CT 08/06/2014.  Biliary stent pneumobilia.  Right IJ porta catheter with tip at the upper SVC.  IMPRESSION: Right lower lobe pneumonia with small effusion.   Electronically Signed   By: Monte Fantasia M.D.   On: 11/18/2014 14:02    CBC  Recent Labs Lab 11/14/14 1200 11/18/14 1117 11/18/14 1630 11/18/14 1900 11/19/14 0418  WBC 13.1* 22.1* 13.9*  --  12.2*  HGB 8.6* 8.1* 6.1* 6.4* 7.9*  HCT 26.3* 24.8* 19.1* 20.2* 24.4*  PLT 308 335 213  --  236  MCV 96.2 96.8 98.5  --  95.7  MCH 31.5 31.7 31.4  --  31.0  MCHC 32.7 32.8 31.9  --  32.4  RDW 15.2* 15.5* 15.1  --  16.6*  LYMPHSABS 0.6* 0.8*  --   --   --   MONOABS 1.0* 0.9  --    --   --   EOSABS 0.0 0.0  --   --   --   BASOSABS 0.0 0.1  --   --   --     Chemistries   Recent Labs Lab 11/14/14 1159 11/14/14 1200 11/18/14 1117 11/18/14 1630 11/19/14 0418  NA 135*  --  136  --  138  K 4.2  --  4.8  --  3.7  CL  --   --   --   --  111  CO2 23  --  22  --  21*  GLUCOSE 181*  --  145*  --  132*  BUN 19.0  --  29.5*  --  23*  CREATININE 1.0  --  1.3 0.97 0.87  CALCIUM 9.4  --  9.3  --  8.1*  MG  --  2.0  --   --   --   AST 41*  --  51*  --   --   ALT 66*  --  66*  --   --   ALKPHOS 555*  --  442*  --   --   BILITOT 0.58  --  1.01  --   --    ------------------------------------------------------------------------------------------------------------------ estimated creatinine clearance is 56.7 mL/min (by C-G formula based on Cr of 0.87). ------------------------------------------------------------------------------------------------------------------ No results for input(s): HGBA1C in the last 72 hours. ------------------------------------------------------------------------------------------------------------------ No results for input(s): CHOL, HDL, LDLCALC, TRIG, CHOLHDL, LDLDIRECT in the last 72 hours. ------------------------------------------------------------------------------------------------------------------  No results for input(s): TSH, T4TOTAL, T3FREE, THYROIDAB in the last 72 hours.  Invalid input(s): FREET3 ------------------------------------------------------------------------------------------------------------------  Recent Labs  11/19/14 0810  RETICCTPCT 2.0    Coagulation profile No results for input(s): INR, PROTIME in the last 168 hours.  No results for input(s): DDIMER in the last 72 hours.  Cardiac Enzymes No results for input(s): CKMB, TROPONINI, MYOGLOBIN in the last 168 hours.  Invalid input(s):  CK ------------------------------------------------------------------------------------------------------------------ Invalid input(s): POCBNP  No results for input(s): GLUCAP in the last 72 hours.   RAI,RIPUDEEP M.D. [facility] 11/19/2014, 10:37 AM  Pager: [phone] Between 7am to 7pm - call Pager - [phone]  After 7pm go to [email or web address] - password [email or web address]  Call night coverage person covering after 7pm

## 2014-11-19 NOTE — Progress Notes (Signed)
ANTIBIOTIC CONSULT NOTE - FOLLOW UP  Pharmacy Consult for Vancomycin / Aztreonam Indication: pneumonia  Allergies  Allergen Reactions  . Augmentin [Amoxicillin-Pot Clavulanate] Diarrhea  . Carboplatin     Rash and itching after carbo test dose  . Levaquin [Levofloxacin] Other (See Comments)    Knee pain  . Percocet [Oxycodone-Acetaminophen] Nausea And Vomiting  . Zolpidem Tartrate Diarrhea    Patient Measurements: Height: '5\' 8"'$  (172.7 cm) Weight: 110 lb 3.7 oz (50 kg) IBW/kg (Calculated) : 68.4 Adjusted Body Weight:   Vital Signs: Temp: 98.1 F (36.7 C) (08/23 1200) Temp Source: Oral (08/23 1200) BP: 165/89 mmHg (08/23 1200) Pulse Rate: 101 (08/23 1200) Intake/Output from previous day: 08/22 0701 - 08/23 0700 In: 3965.7 [P.O.:120; I.V.:2776.7; Blood:400; NG/GT:569; IV Piggyback:100] Out: 1610 [Urine:1590] Intake/Output from this shift: Total I/O In: 480 [I.V.:125; Blood:315; NG/GT:40] Out: 750 [Urine:750]  Labs:  Recent Labs  11/18/14 1117 11/18/14 1117 11/18/14 1630 11/18/14 1900 11/19/14 0418  WBC 22.1*  --  13.9*  --  12.2*  HGB 8.1*  --  6.1* 6.4* 7.9*  PLT 335  --  213  --  236  CREATININE  --  1.3 0.97  --  0.87   Estimated Creatinine Clearance: 56.7 mL/min (by C-G formula based on Cr of 0.87). No results for input(s): VANCOTROUGH, VANCOPEAK, VANCORANDOM, GENTTROUGH, GENTPEAK, GENTRANDOM, TOBRATROUGH, TOBRAPEAK, TOBRARND, AMIKACINPEAK, AMIKACINTROU, AMIKACIN in the last 72 hours.   Microbiology: Recent Results (from the past 720 hour(s))  Urine culture     Status: None (Preliminary result)   Collection Time: 11/18/14  3:42 PM  Result Value Ref Range Status   Specimen Description URINE, RANDOM  Final   Special Requests NONE  Final   Culture   Final    NO GROWTH < 24 HOURS Performed at [facility]    Report Status PENDING  Incomplete  MRSA PCR Screening     Status: None   Collection Time: 11/18/14  4:11 PM  Result Value Ref Range Status    MRSA by PCR NEGATIVE NEGATIVE Final    Comment:        The GeneXpert MRSA Assay (FDA approved for NASAL specimens only), is one component of a comprehensive MRSA colonization surveillance program. It is not intended to diagnose MRSA infection nor to guide or monitor treatment for MRSA infections.     Anti-infectives    Start     Dose/Rate Route Frequency Ordered Stop   11/19/14 1600  vancomycin (VANCOCIN) IVPB 750 mg/150 ml premix  Status:  Discontinued     750 mg 150 mL/hr over 60 Minutes Intravenous Every 24 hours 11/18/14 1425 11/19/14 1219   11/19/14 1400  vancomycin (VANCOCIN) 500 mg in sodium chloride 0.9 % 100 mL IVPB     500 mg 100 mL/hr over 60 Minutes Intravenous Every 12 hours 11/19/14 1220     11/19/14 0000  aztreonam (AZACTAM) 2 g in dextrose 5 % 50 mL IVPB  Status:  Discontinued     2 g 100 mL/hr over 30 Minutes Intravenous Every 8 hours 11/18/14 1424 11/18/14 1607   11/18/14 2200  aztreonam (AZACTAM) 2 g in dextrose 5 % 50 mL IVPB     2 g 100 mL/hr over 30 Minutes Intravenous 3 times per day 11/18/14 1601 11/26/14 2159   11/18/14 1415  aztreonam (AZACTAM) 2 g in dextrose 5 % 50 mL IVPB     2 g 100 mL/hr over 30 Minutes Intravenous  Once 11/18/14 1410 11/18/14 1450  11/18/14 1415  vancomycin (VANCOCIN) IVPB 1000 mg/200 mL premix     1,000 mg 200 mL/hr over 60 Minutes Intravenous  Once 11/18/14 1410 11/18/14 1605      Assessment: 76 yoM with hx NSCLC on chemotherapy presents with fever, chills, and hypotension.  Started on broad spectrum antibiotics for SIRS 2/2 HCAP vs aspiration PNA.   Anti-infectives 8/22 >> Vancomycin  >> 8/22 >> Aztreonam  >>    Vitals/Labs WBC: Improving Tm24h: 100.2 SCr 1.3-->0.87, CrCl ~ 57 ml/min CG (N80)  Cultures 8/22 bloodx2: IP 8/22 urine: NGTD  8/22 MRSA PCR neg 8/22 Strep pneumo: neg 8/22 Legionella: IP  Goal of Therapy:  Vancomycin trough level 15-20 mcg/ml  Eradication of infection  Plan:  Day #2  antibiotics Continue aztreonam 2g IV q8h Change to vancomycin '500mg'$  IV q12h for improving renal function F/u renal fxn, cultures, clinical course Allergy noted to augmentin (diarrhea), however pt has tolerated penicillins and cephalosporins   Ralene Bathe, PharmD, BCPS 11/19/2014, 12:40 PM  Pager: [phone]

## 2014-11-19 NOTE — Progress Notes (Signed)
Initial Nutrition Assessment  DOCUMENTATION CODES:   Severe malnutrition in context of chronic illness  INTERVENTION:  Vital 1.5 1200 ml @ goal rate of 78m/hr Provides 1800 kcal, 81g Pro   NUTRITION DIAGNOSIS:   Malnutrition related to dysphagia, nausea, cancer and cancer related treatments as evidenced by severe depletion of body fat, severe depletion of muscle mass, percent weight loss.   GOAL:   Provide needs based on ASPEN/SCCM guidelines, Patient will meet greater than or equal to 90% of their needs    MONITOR:   Labs, I & O's, Skin, Weight trends, TF tolerance, Diet advancement, PO intake  REASON FOR ASSESSMENT:   Consult Assessment of nutrition requirement/status  ASSESSMENT:   Pt presents with lung cancer, pneumonia, h/o dysphagia, undergoing third round of chemotherapy from diagnosis in September 2015.  Consult requested by Dr due to poor toleration of Osmolite 1.5 @ rate higher than 40 ml/hr.  PEG was placed on 11/06/2014 following dysphagia issues.  Pt feels nauseated at higher rates, is currently receiving reglan to assist with GI motility but was not enough.  Switched tube feed to Vital 1.5 @ goal rate of 50 ml/hr. Peptide based formula should be tolerated better.   Pt appears heavily malnourished 598'8 110 lbs with severe muscle and fat depletion throughout body. Pt also exhibits 9%/11lb. wt loss over 2.5 months Recently diagnosed with pneumonia, Vital should also aid immune system.  Diet has been advanced to soft, monitor for changes and sufficient oral intake.  Diet Order:  DIET SOFT Room service appropriate?: Yes; Fluid consistency:: Thin  Skin:  Reviewed, no issues  Last BM:  11/17/2014  Height:   Ht Readings from Last 1 Encounters:  11/18/14 '5\' 8"'$  (1.727 m)    Weight:   Wt Readings from Last 1 Encounters:  11/19/14 110 lb 3.7 oz (50 kg)    Ideal Body Weight:  70 kg  BMI:  Body mass index is 16.76 kg/(m^2).  Estimated Nutritional  Needs:   Kcal:  1800-2000  Protein:  75-90  Fluid:  >=/ 1.8L  EDUCATION NEEDS:   No education needs identified at this time  WSatira Anis Meher Kucinski, MS, RD LDN After Hours/Weekend Pager [phone]

## 2014-11-19 NOTE — Care Management Note (Signed)
Case Management Note  Patient Details  Name: [name] MRN: [id number] Date of Birth: September 09, 1945  Subjective/Objective:       Hypotension and anemia requiring pressure support with iv flds and prbc's             Action/Plan:Date:  November 19, 2014 U.R. performed for needs and level of care. Will continue to follow for Case Management needs.  Velva Harman, RN, BSN, [place]   [phone]   Expected Discharge Date:   (unknown)               Expected Discharge Plan:  Home/Self Care  In-House Referral:  NA  Discharge planning Services  CM Consult  Post Acute Care Choice:  NA Choice offered to:  NA  DME Arranged:  N/A DME Agency:  NA  HH Arranged:  NA HH Agency:  NA  Status of Service:  In process, will continue to follow  Medicare Important Message Given:    Date Medicare IM Given:    Medicare IM give by:    Date Additional Medicare IM Given:    Additional Medicare Important Message give by:     If discussed at [place] of Stay Meetings, dates discussed:    Additional Comments:  Leeroy Cha, RN 11/19/2014, 12:06 PM

## 2014-11-20 ENCOUNTER — Other Ambulatory Visit: Payer: Self-pay | Admitting: Medical Oncology

## 2014-11-20 LAB — TYPE AND SCREEN
ABO/RH(D): O POS
Antibody Screen: NEGATIVE
Unit division: 0
Unit division: 0

## 2014-11-20 LAB — URINE CULTURE: Culture: 4000

## 2014-11-20 LAB — LEGIONELLA ANTIGEN, URINE

## 2014-11-20 LAB — CBC
HEMATOCRIT: 28.3 % — AB (ref 39.0–52.0)
HEMOGLOBIN: 9 g/dL — AB (ref 13.0–17.0)
MCH: 30 pg (ref 26.0–34.0)
MCHC: 31.8 g/dL (ref 30.0–36.0)
MCV: 94.3 fL (ref 78.0–100.0)
Platelets: 210 10*3/uL (ref 150–400)
RBC: 3 MIL/uL — ABNORMAL LOW (ref 4.22–5.81)
RDW: 16.4 % — ABNORMAL HIGH (ref 11.5–15.5)
WBC: 14.1 10*3/uL — ABNORMAL HIGH (ref 4.0–10.5)

## 2014-11-20 LAB — BASIC METABOLIC PANEL
Anion gap: 7 (ref 5–15)
BUN: 17 mg/dL (ref 6–20)
CHLORIDE: 108 mmol/L (ref 101–111)
CO2: 23 mmol/L (ref 22–32)
CREATININE: 0.76 mg/dL (ref 0.61–1.24)
Calcium: 8.1 mg/dL — ABNORMAL LOW (ref 8.9–10.3)
GFR calc Af Amer: >60 mL/min (ref >60)
GFR calc non Af Amer: >60 mL/min (ref >60)
Glucose, Bld: 125 mg/dL — ABNORMAL HIGH (ref 65–99)
Potassium: 3.2 mmol/L — ABNORMAL LOW (ref 3.5–5.1)
Sodium: 138 mmol/L (ref 135–145)

## 2014-11-20 LAB — HAPTOGLOBIN: Haptoglobin: 280 mg/dL — ABNORMAL HIGH (ref 34–200)

## 2014-11-20 LAB — OCCULT BLOOD X 1 CARD TO LAB, STOOL: Fecal Occult Bld: POSITIVE — AB

## 2014-11-20 LAB — PROCALCITONIN: Procalcitonin: 11.34 ng/mL

## 2014-11-20 MED ORDER — BENZONATATE 100 MG PO CAPS
100.0000 mg | ORAL_CAPSULE | Freq: Three times a day (TID) | ORAL | Status: DC
  Administered 2014-11-20 – 2014-11-22 (×8): 100 mg via ORAL
  Filled 2014-11-20 (×8): qty 1

## 2014-11-20 MED ORDER — SENNOSIDES-DOCUSATE SODIUM 8.6-50 MG PO TABS
1.0000 | ORAL_TABLET | Freq: Every day | ORAL | Status: DC | PRN
  Administered 2014-11-20: 1 via ORAL
  Filled 2014-11-20: qty 1

## 2014-11-20 MED ORDER — ADULT MULTIVITAMIN W/MINERALS CH
1.0000 | ORAL_TABLET | Freq: Every morning | ORAL | Status: DC
  Administered 2014-11-20 – 2014-11-22 (×3): 1 via ORAL
  Filled 2014-11-20 (×3): qty 1

## 2014-11-20 MED ORDER — PRO-STAT SUGAR FREE PO LIQD
30.0000 mL | Freq: Two times a day (BID) | ORAL | Status: DC
  Administered 2014-11-20 – 2014-11-22 (×4): 30 mL via ORAL
  Filled 2014-11-20 (×4): qty 30

## 2014-11-20 MED ORDER — ENSURE ENLIVE PO LIQD
237.0000 mL | Freq: Two times a day (BID) | ORAL | Status: DC
  Administered 2014-11-20 (×2): 237 mL via ORAL

## 2014-11-20 MED ORDER — POTASSIUM CHLORIDE CRYS ER 20 MEQ PO TBCR
40.0000 meq | EXTENDED_RELEASE_TABLET | Freq: Once | ORAL | Status: AC
  Administered 2014-11-20: 40 meq via ORAL
  Filled 2014-11-20: qty 2

## 2014-11-20 MED ORDER — HYDROCODONE-ACETAMINOPHEN 5-325 MG PO TABS
1.0000 | ORAL_TABLET | ORAL | Status: DC | PRN
Start: 2014-11-20 — End: 2014-11-22

## 2014-11-20 MED ORDER — ONDANSETRON HCL 4 MG/2ML IJ SOLN
4.0000 mg | Freq: Four times a day (QID) | INTRAMUSCULAR | Status: DC | PRN
  Administered 2014-11-20: 4 mg via INTRAVENOUS
  Filled 2014-11-20: qty 2

## 2014-11-20 MED ORDER — VITAL 1.5 CAL PO LIQD
1000.0000 mL | ORAL | Status: DC
  Administered 2014-11-20 – 2014-11-21 (×2): 1000 mL
  Filled 2014-11-20 (×3): qty 1000

## 2014-11-20 MED ORDER — HYDROMORPHONE HCL 1 MG/ML IJ SOLN: 1.0000 mg | INTRAMUSCULAR | Status: DC | PRN

## 2014-11-20 MED ORDER — HYDROCOD POLST-CPM POLST ER 10-8 MG/5ML PO SUER
5.0000 mL | Freq: Two times a day (BID) | ORAL | Status: DC | PRN
  Administered 2014-11-21: 5 mL via ORAL
  Filled 2014-11-20: qty 5

## 2014-11-20 MED ORDER — ACETAMINOPHEN 325 MG PO TABS
650.0000 mg | ORAL_TABLET | ORAL | Status: DC | PRN
  Administered 2014-11-20: 650 mg via ORAL
  Filled 2014-11-20: qty 2

## 2014-11-20 MED ORDER — ATENOLOL 50 MG PO TABS
50.0000 mg | ORAL_TABLET | Freq: Every day | ORAL | Status: DC
  Administered 2014-11-20 – 2014-11-22 (×3): 50 mg via ORAL
  Filled 2014-11-20 (×3): qty 1

## 2014-11-20 NOTE — Clinical Documentation Improvement (Signed)
Internal Medicine  Can the diagnosis of anemia be further specified?   Iron deficiency Anemia  Nutritional anemia, including the nutrition or mineral deficits  Chronic Anemia, including the suspected or known cause  Anemia of chronic disease, including the associated chronic disease state  Other  Clinically Undetermined  Document any associated diagnoses/conditions.   Supporting Information: HGB at critical level of 6.2   Please exercise your independent, professional judgment when responding. A specific answer is not anticipated or expected.   Thank You,  [place]

## 2014-11-20 NOTE — Progress Notes (Signed)
[facility]                                                                              Patient Demographics  [name], is a 69 y.o. male, DOB - [birth year], BUL:[id number]  Admit date - 11/18/2014   Admitting Physician  Krystal Eaton, MD  Outpatient Primary MD for the patient is [name], MD  LOS - 2   Chief Complaint  Patient presents with  . Hypotension  . Chills       Brief HPI   Patient is a 69 year old male with lung cancer- non-small cell, diagnosed in September 2010, currently undergoing chemotherapy, hypertension, anorexia, dysphagia recently had PEG tube placed on 11/06/2014 presented from the [facility] for hypotension. History was obtained from the patient and his wife. Patient reported that He has not been eating well in the last few months, had recent PEG tube placed on 11/06/2014. He started having coughing with clear sputum in the last 2-3 days, worsened overnight with shaking chills, one episode of vomiting. The patient had received chemotherapy last week for the third round. Heart rate was also elevated in 120s. Patient's wife had given him off of his atenolol tabs due to elevated heart rate at 4 AM this morning. Patient had reinitiated his IV fluid hydration at home over the past weekend. At the cancer Center BP was 70/56. ED workup showed BUN 29, creatinine 1.3, sodium 136, WBCs 22.1, hemoglobin 8.1, platelets 335, lactic acid 1.9. Chest x-ray showed right lower lobe pneumonia   Assessment & Plan   Principal Problem:  Sepsis/ SIRS, HCAP (healthcare associated pneumonia): Patient meets sirs criteria with leukocytosis, hypotension, tachycardia, source of infection, pneumonia, possibly aspiration - pro-calcitonin 22.9 on admission, improving, lactic acid 1.96, blood cultures negative to date. - Urine strep antigen negative, follow urine Legionella antigen - Continue broad-spectrum  IV vancomycin, aztreonam - BP has  stabilized, stopped IV fluids, restarted atenolol  Anemia - No obvious GI bleeding or any epistaxis or hemoptysis - Stool occult test is however positive, hemolysis panel is negative  - Patient was transfused 2 units packed RBC on 8/23, hemoglobin 9.0  - On SCDs - Patient had a recent endoscopic ultrasound done by Dr. Benson Norway on 08/09/14, which had shown porta hepatis mass consistent with adenocarcinoma. I have consulted gastroenterology, Dr. Collene Mares, will follow today - Continue PPI   Non-small cell lung cancer left, porta hepatis mass, adenocarcinoma - Currently undergoing chemotherapy, added Dr. [name] to care teams, patient was sent over from [facility]. I requested oncology to follow up today, d/w NP. Patient has appointment on 8/26 with oncology.   Dehydration, Weight loss - Placed on IV fluid hydration, restart tube feeding   Hypotension with history of hypertension - BP now stable, continue to hold atenolol,  continue gentle hydration   Dysphagia - Patient has a PEG tube recently placed on 11/06/14, will continue TF Osmolite 1.5 - He also wants to try whatever he can eat, does not have much appetite, hence will continue soft regular diet.  Nutrition consult placed.  Hypokalemia Replaced  Code Status:  DO NOT RESUSCITATE  Family Communication: Discussed in detail with the patient, all imaging results, lab results explained to the patient    Disposition Plan: Transfer to telemetry, PTOT  Time Spent in minutes  25 minutes  Procedures  Chest x-ray  Consults   None  DVT Prophylaxis SCD's  Medications  Scheduled Meds: . sodium chloride   Intravenous Once  . atenolol  50 mg Oral Daily  . aztreonam  2 g Intravenous 3 times per day  . benzonatate  100 mg Oral TID  . feeding supplement (ENSURE ENLIVE)  237 mL Oral BID BM  . ferrous sulfate  325 mg Oral TID WC  . mirabegron ER  50 mg Oral QPM  . multivitamin with minerals  1 tablet Oral q morning - 10a   . OLANZapine  10 mg Oral QHS  . pantoprazole  40 mg Oral Daily  . vancomycin  500 mg Intravenous Q12H   Continuous Infusions: . feeding supplement (VITAL 1.5 CAL) 1,000 mL (11/20/14 0600)   PRN Meds:.chlorpheniramine-HYDROcodone, fluticasone, HYDROcodone-acetaminophen, HYDROmorphone (DILAUDID) injection, HYDROmorphone, metoCLOPramide, prochlorperazine, senna-docusate   Antibiotics   Anti-infectives    Start     Dose/Rate Route Frequency Ordered Stop   11/19/14 1600  vancomycin (VANCOCIN) IVPB 750 mg/150 ml premix  Status:  Discontinued     750 mg 150 mL/hr over 60 Minutes Intravenous Every 24 hours 11/18/14 1425 11/19/14 1219   11/19/14 1400  vancomycin (VANCOCIN) 500 mg in sodium chloride 0.9 % 100 mL IVPB     500 mg 100 mL/hr over 60 Minutes Intravenous Every 12 hours 11/19/14 1220     11/19/14 0000  aztreonam (AZACTAM) 2 g in dextrose 5 % 50 mL IVPB  Status:  Discontinued     2 g 100 mL/hr over 30 Minutes Intravenous Every 8 hours 11/18/14 1424 11/18/14 1607   11/18/14 2200  aztreonam (AZACTAM) 2 g in dextrose 5 % 50 mL IVPB     2 g 100 mL/hr over 30 Minutes Intravenous 3 times per day 11/18/14 1601 11/26/14 2159   11/18/14 1415  aztreonam (AZACTAM) 2 g in dextrose 5 % 50 mL IVPB     2 g 100 mL/hr over 30 Minutes Intravenous  Once 11/18/14 1410 11/18/14 1450   11/18/14 1415  vancomycin (VANCOCIN) IVPB 1000 mg/200 mL premix     1,000 mg 200 mL/hr over 60 Minutes Intravenous  Once 11/18/14 1410 11/18/14 1605        Subjective:   Breyer Tejera was seen and examined today.  Feels a lot better, wants to sit up in the chair, still has coughing but otherwise no obvious GI bleeding. No fevers. Patient denies dizziness, chest pain, shortness of breath, abdominal pain, N/V/D/C, new weakness, numbess, tingling. No acute events overnight.    Objective:   Blood pressure 129/83, pulse 112, temperature 97.8 F (36.6 C), temperature source Oral, resp. rate 20, height 5' 8" (1.727  m), weight 50 kg (110 lb 3.7 oz), SpO2 97 %.  Wt Readings from Last 3 Encounters:  11/19/14 50 kg (110 lb 3.7 oz)  11/14/14 46.04 kg (101 lb 8 oz)  11/07/14 48.353 kg (106 lb 9.6 oz)     Intake/Output Summary (Last 24 hours) at 11/20/14 1103 Last data filed at 11/20/14 0915  Gross per 24 hour  Intake 2464.17 ml  Output   3550 ml  Net -1085.83 ml    Exam  General: Alert and oriented x 3, NAD  HEENT:  PERRLA, EOMI,   Neck: Supple, no JVD,  no masses  CVS: S1 S2 clear, RRR  Respiratory: Decreased breath sounds at the bases   Abdomen: Soft, nontender, nondistended, + bowel sounds, +PEG  Ext: no cyanosis clubbing or edema  Neuro: no new deficits  Skin: No rashes  Psych: Normal affect and demeanor, alert and oriented x3    Data Review   Micro Results Recent Results (from the past 240 hour(s))  Blood Culture (routine x 2)     Status: None (Preliminary result)   Collection Time: 11/18/14  1:39 PM  Result Value Ref Range Status   Specimen Description BLOOD RIGHT ANTECUBITAL  Final   Special Requests BOTTLES DRAWN AEROBIC AND ANAEROBIC 5ML  Final   Culture   Final    NO GROWTH < 24 HOURS Performed at [facility]    Report Status PENDING  Incomplete  Blood Culture (routine x 2)     Status: None (Preliminary result)   Collection Time: 11/18/14  2:10 PM  Result Value Ref Range Status   Specimen Description BLOOD PORTA CATH  Final   Special Requests BLOOD 5CC  Final   Culture   Final    NO GROWTH < 24 HOURS Performed at [facility]    Report Status PENDING  Incomplete  Urine culture     Status: None   Collection Time: 11/18/14  3:42 PM  Result Value Ref Range Status   Specimen Description URINE, RANDOM  Final   Special Requests NONE  Final   Culture   Final    4,000 COLONIES/mL INSIGNIFICANT GROWTH Performed at [facility]    Report Status 11/20/2014 FINAL  Final  MRSA PCR Screening     Status: None   Collection Time: 11/18/14  4:11  PM  Result Value Ref Range Status   MRSA by PCR NEGATIVE NEGATIVE Final    Comment:        The GeneXpert MRSA Assay (FDA approved for NASAL specimens only), is one component of a comprehensive MRSA colonization surveillance program. It is not intended to diagnose MRSA infection nor to guide or monitor treatment for MRSA infections.   Culture, sputum-assessment     Status: None   Collection Time: 11/19/14  6:28 PM  Result Value Ref Range Status   Specimen Description Expect. Sput  Final   Special Requests NONE  Final   Sputum evaluation   Final    THIS SPECIMEN IS ACCEPTABLE. RESPIRATORY CULTURE REPORT TO FOLLOW.   Report Status 11/19/2014 FINAL  Final  Culture, respiratory (NON-Expectorated)     Status: None (Preliminary result)   Collection Time: 11/19/14  7:15 PM  Result Value Ref Range Status   Specimen Description SPUTUM  Final   Special Requests NONE  Final   Gram Stain   Final    ABUNDANT WBC PRESENT,BOTH PMN AND MONONUCLEAR RARE SQUAMOUS EPITHELIAL CELLS PRESENT NO ORGANISMS SEEN Performed at [facility]    Culture PENDING  Incomplete   Report Status PENDING  Incomplete    Radiology Reports Ir Gastrostomy Tube  11/06/2014   CLINICAL DATA:  Metastatic lung carcinoma, weight loss, loss of appetite, dysphagia and poor nutrition. The patient presents for percutaneous gastrostomy tube placement.  EXAM: PERCUTANEOUS GASTROSTOMY TUBE PLACEMENT  ANESTHESIA/SEDATION: 5.0 mg IV Versed; 50 mcg IV Fentanyl.  Total Moderate Sedation Time  30 minutes.  CONTRAST:  107m OMNIPAQUE IOHEXOL 300 MG/ML  SOLN  MEDICATIONS: 1 g IV vancomycin. Vancomycin was given within two hours of incision. Vancomycin was given due to an  antibiotic allergy.  FLUOROSCOPY TIME:  11 minutes.  PROCEDURE: The procedure, risks, benefits, and alternatives were explained to the patient. Questions regarding the procedure were encouraged and answered. The patient understands and consents to the procedure.  A  time-out was performed prior to the procedure. A 5-French catheter was then advanced through the the patient's mouth under fluoroscopy into the esophagus and to the level of the stomach. This catheter was used to insufflate the stomach with air under fluoroscopy.  The abdominal wall was prepped with Betadine in a sterile fashion, and a sterile drape was applied covering the operative field. A sterile gown and sterile gloves were used for the procedure. Local anesthesia was provided with 1% Lidocaine.  A skin incision was made in the upper abdominal wall. Under fluoroscopy, an 18 gauge trocar needle was advanced into the stomach. Contrast injection was performed to confirm intraluminal position of the needle tip. A single T tack was then deployed in the lumen of the stomach. This was brought up to tension at the skin surface.  Over a guidewire, a 9-French sheath was advanced into the lumen of the stomach. The wire was left in place as a safety wire. A loop snare device from a percutaneous gastrostomy kit was then advanced into the stomach.  A floppy guide wire was advanced through the orogastric catheter under fluoroscopy in the stomach. The loop snare advanced through the percutaneous gastric access was used to snare the guide wire. This allowed withdrawal of the loop snare out of the patient's mouth by retraction of the orogastric catheter and wire.  A 20-French bumper retention gastrostomy tube was looped around the snare device. It was then pulled back through the patient's mouth. The retention bumper was brought up to the anterior gastric wall. The T tack suture was cut at the skin. The exiting gastrostomy tube was cut to appropriate length and a feeding adapter applied. The catheter was injected with contrast material to confirm position and a fluoroscopic spot image saved. The tube was then flushed with saline. A dressing was applied over the gastrostomy exit site.  COMPLICATIONS: None.  FINDINGS: The stomach  distended well with air allowing safe placement of the gastrostomy tube. After placement, the tip of the gastrostomy tube lies in the body of the stomach.  IMPRESSION: Percutaneous gastrostomy with placement of a 20-French bumper retention tube in the body of the stomach. This tube can be used for percutaneous feeds beginning in 24 hours after placement.   Electronically Signed   By: Aletta Edouard M.D.   On: 11/06/2014 13:01   Dg Esophagus  10/31/2014   CLINICAL DATA:  Dysphagia  EXAM: ESOPHOGRAM/BARIUM SWALLOW  TECHNIQUE: Single contrast examination was performed using  thin barium.  FLUOROSCOPY TIME:  Radiation Exposure Index (as provided by the fluoroscopic device): 36 Gy per sq cm  If the device does not provide the exposure index:  Fluoroscopy Time:  1 minutes 42 seconds  Number of Acquired Images:  COMPARISON:  CT chest of 08/06/2014  FINDINGS: The patient was not able to stand for an adequate length of time, and therefore a lateral series with rapid sequence spot spot films of the cervical esophagus was performed. The swallowing mechanism is unremarkable. No aspiration is seen. There are mild tertiary contractions in the mid and distal esophagus. No definite hiatal hernia is seen. No gastroesophageal reflux is demonstrated.  IMPRESSION: Mild tertiary contractions in the mid and distal esophagus. No hiatal hernia. No definite reflux.   Electronically  Signed   By: Ivar Drape M.D.   On: 10/31/2014 10:39   Dg Chest Port 1 View  11/18/2014   CLINICAL DATA:  Hypotension and fever/chills.  EXAM: PORTABLE CHEST - 1 VIEW  COMPARISON:  09/20/2014  FINDINGS: Dense airspace disease in the right lower lobe. Small right pleural effusion. No evidence of cavitation.  Clear left lung.  Normal heart size and stable aortic contours. Fullness of the right peritracheal stripe most recently evaluated by chest CT 08/06/2014.  Biliary stent pneumobilia.  Right IJ porta catheter with tip at the upper SVC.  IMPRESSION: Right  lower lobe pneumonia with small effusion.   Electronically Signed   By: Monte Fantasia M.D.   On: 11/18/2014 14:02    CBC  Recent Labs Lab 11/14/14 1200 11/18/14 1117 11/18/14 1630 11/18/14 1900 11/19/14 0418 11/19/14 1530 11/20/14 0520  WBC 13.1* 22.1* 13.9*  --  12.2*  --  14.1*  HGB 8.6* 8.1* 6.1* 6.4* 7.9* 9.1* 9.0*  HCT 26.3* 24.8* 19.1* 20.2* 24.4* 28.1* 28.3*  PLT 308 335 213  --  236  --  210  MCV 96.2 96.8 98.5  --  95.7  --  94.3  MCH 31.5 31.7 31.4  --  31.0  --  30.0  MCHC 32.7 32.8 31.9  --  32.4  --  31.8  RDW 15.2* 15.5* 15.1  --  16.6*  --  16.4*  LYMPHSABS 0.6* 0.8*  --   --   --   --   --   MONOABS 1.0* 0.9  --   --   --   --   --   EOSABS 0.0 0.0  --   --   --   --   --   BASOSABS 0.0 0.1  --   --   --   --   --     Chemistries   Recent Labs Lab 11/14/14 1159 11/14/14 1200 11/18/14 1117 11/18/14 1630 11/19/14 0418 11/20/14 0520  NA 135*  --  136  --  138 138  K 4.2  --  4.8  --  3.7 3.2*  CL  --   --   --   --  111 108  CO2 23  --  22  --  21* 23  GLUCOSE 181*  --  145*  --  132* 125*  BUN 19.0  --  29.5*  --  23* 17  CREATININE 1.0  --  1.3 0.97 0.87 0.76  CALCIUM 9.4  --  9.3  --  8.1* 8.1*  MG  --  2.0  --   --   --   --   AST 41*  --  51*  --   --   --   ALT 66*  --  66*  --   --   --   ALKPHOS 555*  --  442*  --   --   --   BILITOT 0.58  --  1.01  --   --   --    ------------------------------------------------------------------------------------------------------------------ estimated creatinine clearance is 61.6 mL/min (by C-G formula based on Cr of 0.76). ------------------------------------------------------------------------------------------------------------------ No results for input(s): HGBA1C in the last 72 hours. ------------------------------------------------------------------------------------------------------------------ No results for input(s): CHOL, HDL, LDLCALC, TRIG, CHOLHDL, LDLDIRECT in the last 72  hours. ------------------------------------------------------------------------------------------------------------------ No results for input(s): TSH, T4TOTAL, T3FREE, THYROIDAB in the last 72 hours.  Invalid input(s): FREET3 ------------------------------------------------------------------------------------------------------------------  Recent Labs  11/19/14 0810 11/19/14 0832  VITAMINB12 443  --   FOLATE  --  18.5  FERRITIN 1567*  --   TIBC NOT CALCULATED  --   IRON 23*  --   RETICCTPCT 2.0  --     Coagulation profile No results for input(s): INR, PROTIME in the last 168 hours.  No results for input(s): DDIMER in the last 72 hours.  Cardiac Enzymes No results for input(s): CKMB, TROPONINI, MYOGLOBIN in the last 168 hours.  Invalid input(s): CK ------------------------------------------------------------------------------------------------------------------ Invalid input(s): POCBNP  No results for input(s): GLUCAP in the last 72 hours.   , M.D. [facility] 11/20/2014, 11:03 AM  Pager: [phone] Between 7am to 7pm - call Pager - [phone]  After 7pm go to [email or web address] - password [email or web address]  Call night coverage person covering after 7pm

## 2014-11-20 NOTE — Progress Notes (Signed)
Nutrition Follow-up  DOCUMENTATION CODES:   Severe malnutrition in context of chronic illness  INTERVENTION:  Ensure Enlive po BID, each supplement provides 350 kcal and 20 grams of pro  Prostat via PEG BID, provides 100 kcal and 15g pro per serving.  Recommend appetite stimulant to deal with lack of appetite, poor po intake.  NUTRITION DIAGNOSIS:   Malnutrition related to dysphagia, nausea, cancer and cancer related treatments as evidenced by severe depletion of body fat, severe depletion of muscle mass, percent weight loss.  Ongoing  GOAL:   Patient will meet greater than or equal to 90% of their needs  Ensure Enlive BID will help reach this.  MONITOR:   Labs, I & O's, Skin, Weight trends, TF tolerance, Diet advancement, PO intake  REASON FOR ASSESSMENT:   Consult Poor PO  ASSESSMENT:   Pt presents with lung cancer, pneumonia, h/o dysphagia, undergoing third round of chemotherapy from diagnosis in September 2015.  Saw pt yesterday, consulted for tubefeed, consulted today for poor po intake, pt reports overall lack of appetite. Providing Ensure Enlive BID to help with intake.   Pt exhibits severe malnutrition in context of chronic illness with muscle and fat depletion throughout body, 11#/9% wt loss over 2.5 months.  Pt is on soft diet, PEG placed 11/06/2014 for issues related to dysphagia.    Continue to monitor PO intake & tubefeed tolerance.  Diet Order:  DIET SOFT Room service appropriate?: Yes; Fluid consistency:: Thin  Skin:  Reviewed, no issues  Last BM:  11/20/14  Height:   Ht Readings from Last 1 Encounters:  11/18/14 '5\' 8"'$  (1.727 m)    Weight:   Wt Readings from Last 1 Encounters:  11/19/14 110 lb 3.7 oz (50 kg)    Ideal Body Weight:  70 kg  BMI:  Body mass index is 16.76 kg/(m^2).  Estimated Nutritional Needs:   Kcal:  1800-2000  Protein:  75-90  Fluid:  >=/ 1.8L  EDUCATION NEEDS:   No education needs identified at this  time  Satira Anis. Tariyah Pendry, MS, RD LDN After Hours/Weekend Pager [phone]

## 2014-11-20 NOTE — Evaluation (Signed)
Physical Therapy Evaluation Patient Details Name: [name] MRN: [id number] DOB: October 05, 1945 Today's Date: 11/20/2014   History of Present Illness  Patient is a 69 year old male with lung cancer- non-small cell, diagnosed in September 2010, currently undergoing chemotherapy, hypertension, anorexia, dysphagia recently had PEG tube placed on 11/06/2014 presented from the [facility] 8/22/16for hypotension.   Clinical Impression  Patient was willing to ambulate today and tolerated well with RW. Patient is interested in [place] for strengthening. Patient will benefit from PT to address problems of weakness and decreased mobility.    Follow Up Recommendations Home health PT;Supervision - Intermittent    Equipment Recommendations  None recommended by PT    Recommendations for Other Services       Precautions / Restrictions Precautions Precautions: Fall Precaution Comments: PEG      Mobility  Bed Mobility Overal bed mobility: Modified Independent                Transfers Overall transfer level: Needs assistance Equipment used: Rolling walker (2 wheeled) Transfers: Sit to/from Stand Sit to Stand: Supervision            Ambulation/Gait Ambulation/Gait assistance: Min guard Ambulation Distance (Feet): 130 Feet Assistive device: Rolling walker (2 wheeled) Gait Pattern/deviations: Decreased stride length     General Gait Details: steady gait, felt fatigued  Stairs            Wheelchair Mobility    Modified Rankin (Stroke Patients Only)       Balance                                             Pertinent Vitals/Pain Pain Assessment: No/denies pain    Home Living Family/patient expects to be discharged to:: Private residence Living Arrangements: Spouse/significant other Available Help at Discharge: Family Type of Home: House Home Access: Stairs to enter     Home Layout: One level;Able to live on main level with  bedroom/bathroom Home Equipment: Gilford Rile - 2 wheels;Wheelchair - manual      Prior Function Level of Independence: Needs assistance   Gait / Transfers Assistance Needed: in house ad lib           Hand Dominance        Extremity/Trunk Assessment   Upper Extremity Assessment: Generalized weakness           Lower Extremity Assessment: Generalized weakness;LLE deficits/detail;RLE deficits/detail RLE Deficits / Details: toes       Communication   Communication: No difficulties  Cognition Arousal/Alertness: Awake/alert Behavior During Therapy: WFL for tasks assessed/performed Overall Cognitive Status: Within Functional Limits for tasks assessed                      General Comments      Exercises General Exercises - Lower Extremity Ankle Circles/Pumps: AROM;Both;10 reps;Supine Quad Sets: AROM;10 reps;Both;Supine Gluteal Sets: AROM;Both;10 reps;Supine Short Arc Quad: AROM;Both;10 reps;Supine Heel Slides: AROM;Both;10 reps;Supine Hip ABduction/ADduction: AROM;Both;10 reps;Supine      Assessment/Plan    PT Assessment Patient needs continued PT services  PT Diagnosis Generalized weakness   PT Problem List Decreased strength;Decreased activity tolerance;Decreased mobility  PT Treatment Interventions DME instruction;Gait training;Stair training;Functional mobility training;Therapeutic activities;Therapeutic exercise;Patient/family education   PT Goals (Current goals can be found in the Care Plan section) Acute Rehab PT Goals Patient Stated Goal: to go home PT Goal Formulation: With  patient/family Time For Goal Achievement: 12/04/14 Potential to Achieve Goals: Good    Frequency Min 3X/week   Barriers to discharge        Co-evaluation               End of Session   Activity Tolerance: Patient tolerated treatment well Patient left: in bed;with call bell/phone within reach;with family/visitor present Nurse Communication: Mobility status          Time: 1445-1459 PT Time Calculation (min) (ACUTE ONLY): 14 min   Charges:   PT Evaluation $Initial PT Evaluation Tier I: 1 Procedure     PT G CodesClaretha Cooper 11/20/2014, 5:10 PM Tresa Endo PT [phone]

## 2014-11-21 ENCOUNTER — Encounter: Payer: Medicare Other | Admitting: Nutrition

## 2014-11-21 ENCOUNTER — Ambulatory Visit: Payer: Medicare Other

## 2014-11-21 ENCOUNTER — Other Ambulatory Visit: Payer: Medicare Other

## 2014-11-21 DIAGNOSIS — C3490 Malignant neoplasm of unspecified part of unspecified bronchus or lung: Secondary | ICD-10-CM

## 2014-11-21 LAB — BASIC METABOLIC PANEL
Anion gap: 5 (ref 5–15)
BUN: 19 mg/dL (ref 6–20)
CHLORIDE: 109 mmol/L (ref 101–111)
CO2: 26 mmol/L (ref 22–32)
CREATININE: 0.71 mg/dL (ref 0.61–1.24)
Calcium: 8.6 mg/dL — ABNORMAL LOW (ref 8.9–10.3)
GFR calc Af Amer: >60 mL/min (ref >60)
GFR calc non Af Amer: >60 mL/min (ref >60)
Glucose, Bld: 128 mg/dL — ABNORMAL HIGH (ref 65–99)
Potassium: 3.8 mmol/L (ref 3.5–5.1)
Sodium: 140 mmol/L (ref 135–145)

## 2014-11-21 LAB — CBC
HEMATOCRIT: 28.7 % — AB (ref 39.0–52.0)
HEMOGLOBIN: 9.5 g/dL — AB (ref 13.0–17.0)
MCH: 31.7 pg (ref 26.0–34.0)
MCHC: 33.1 g/dL (ref 30.0–36.0)
MCV: 95.7 fL (ref 78.0–100.0)
Platelets: 233 10*3/uL (ref 150–400)
RBC: 3 MIL/uL — ABNORMAL LOW (ref 4.22–5.81)
RDW: 16.3 % — ABNORMAL HIGH (ref 11.5–15.5)
WBC: 14.5 10*3/uL — ABNORMAL HIGH (ref 4.0–10.5)

## 2014-11-21 NOTE — Progress Notes (Signed)
[facility]                                                                              Patient Demographics  [name], is a 69 y.o. male, DOB - 07-08-1945, WJX:[id number]  Admit date - 11/18/2014   Admitting Physician Ripudeep Krystal Eaton, MD  Outpatient Primary MD for the patient is [name], MD  LOS - 3   Chief Complaint  Patient presents with  . Hypotension  . Chills       Brief HPI   Patient is a 69 year old male with lung cancer- non-small cell, diagnosed in September 2010, currently undergoing chemotherapy, hypertension, anorexia, dysphagia recently had PEG tube placed on 11/06/2014 presented from the [facility] for hypotension. History was obtained from the patient and his wife. Patient reported that He has not been eating well in the last few months, had recent PEG tube placed on 11/06/2014. He started having coughing with clear sputum in the last 2-3 days, worsened overnight with shaking chills, one episode of vomiting. The patient had received chemotherapy last week for the third round. Heart rate was also elevated in 120s. Patient's wife had given him off of his atenolol tabs due to elevated heart rate at 4 AM this morning. Patient had reinitiated his IV fluid hydration at home over the past weekend. At the cancer Center BP was 70/56. ED workup showed BUN 29, creatinine 1.3, sodium 136, WBCs 22.1, hemoglobin 8.1, platelets 335, lactic acid 1.9. Chest x-ray showed right lower lobe pneumonia   Assessment & Plan   Principal Problem:  Sepsis/ SIRS, HCAP (healthcare associated pneumonia): Patient meets sirs criteria with leukocytosis, hypotension, tachycardia, source of infection, pneumonia, possibly aspiration - pro-calcitonin 22.9 on admission, improving, lactic acid 1.96, blood cultures negative to date. - Urine strep antigen and urine legionella antigen negative. - Continue IV vancomycin, aztreonam, will transition to oral  antibiotics tomorrow prior to discharge  Anemia - No obvious GI bleeding or any epistaxis or hemoptysis - Stool occult test is however positive, hemolysis panel is negative H&H stable now at 9.5, was transfused 2 units on 8/23.   - On SCDs - Patient had a recent endoscopic ultrasound done by Dr. Benson Norway on 08/09/14, which had shown porta hepatis mass consistent with adenocarcinoma. Discussed with Dr. Collene Mares, gastroenterology yesterday who also spoke with Dr. [name], patient's oncologist and at this time deferred any endoscopy procedures. - Continue PPI   Non-small cell lung cancer left, porta hepatis mass, adenocarcinoma - Currently undergoing chemotherapy, added Dr. [name] to care teams, patient was sent over from [facility]. I requested oncology to follow up today, d/w NP. Patient has appointment on 8/26 with oncology.   Dehydration, Weight loss - patient is now eating some food and continue tube feeds   Hypotension with history of hypertension -  BP now stable, continue atenolol   Dysphagia With malnutrition due to dysphagia, malignancy - Patient has a PEG tube recently placed on 11/06/14, will continue TF Osmolite 1.5 - continue with diet and tube feeds, ensure   Code Status:  DO NOT RESUSCITATE  Family Communication: Discussed in detail  with the patient, all imaging results, lab results explained to the patient    Disposition Plan:  DC in am  Time Spent in minutes  25 minutes  Procedures  Chest x-ray  Consults   None  DVT Prophylaxis SCD's  Medications  Scheduled Meds: . sodium chloride   Intravenous Once  . atenolol  50 mg Oral Daily  . aztreonam  2 g Intravenous 3 times per day  . benzonatate  100 mg Oral TID  . feeding supplement (ENSURE ENLIVE)  237 mL Oral BID BM  . feeding supplement (PRO-STAT SUGAR FREE 64)  30 mL Oral BID  . feeding supplement (VITAL 1.5 CAL)  1,000 mL Per Tube Q24H  . ferrous sulfate  325 mg Oral TID WC  . mirabegron ER   50 mg Oral QPM  . multivitamin with minerals  1 tablet Oral q morning - 10a  . OLANZapine  10 mg Oral QHS  . pantoprazole  40 mg Oral Daily  . vancomycin  500 mg Intravenous Q12H   Continuous Infusions:   PRN Meds:.acetaminophen, chlorpheniramine-HYDROcodone, fluticasone, HYDROcodone-acetaminophen, HYDROmorphone (DILAUDID) injection, HYDROmorphone, metoCLOPramide, ondansetron (ZOFRAN) IV, prochlorperazine, senna-docusate   Antibiotics   Anti-infectives    Start     Dose/Rate Route Frequency Ordered Stop   11/19/14 1600  vancomycin (VANCOCIN) IVPB 750 mg/150 ml premix  Status:  Discontinued     750 mg 150 mL/hr over 60 Minutes Intravenous Every 24 hours 11/18/14 1425 11/19/14 1219   11/19/14 1400  vancomycin (VANCOCIN) 500 mg in sodium chloride 0.9 % 100 mL IVPB     500 mg 100 mL/hr over 60 Minutes Intravenous Every 12 hours 11/19/14 1220     11/19/14 0000  aztreonam (AZACTAM) 2 g in dextrose 5 % 50 mL IVPB  Status:  Discontinued     2 g 100 mL/hr over 30 Minutes Intravenous Every 8 hours 11/18/14 1424 11/18/14 1607   11/18/14 2200  aztreonam (AZACTAM) 2 g in dextrose 5 % 50 mL IVPB     2 g 100 mL/hr over 30 Minutes Intravenous 3 times per day 11/18/14 1601 11/26/14 2159   11/18/14 1415  aztreonam (AZACTAM) 2 g in dextrose 5 % 50 mL IVPB     2 g 100 mL/hr over 30 Minutes Intravenous  Once 11/18/14 1410 11/18/14 1450   11/18/14 1415  vancomycin (VANCOCIN) IVPB 1000 mg/200 mL premix     1,000 mg 200 mL/hr over 60 Minutes Intravenous  Once 11/18/14 1410 11/18/14 1605        Subjective:   [name] was seen and examined today.  feeling better, eating breakfast at the time of my encounter. Denies any hematochezia and melena. Coughing has improved. No fevers. Patient denies dizziness, chest pain, shortness of breath, abdominal pain, N/V/D/C, new weakness, numbess, tingling. No acute events overnight.    Objective:   Blood pressure 126/79, pulse 104, temperature 98.3 F  (36.8 C), temperature source Oral, resp. rate 20, height _0  (1.727 m), weight 50 kg (110 lb 3.7 oz), SpO2 97 %.  Wt Readings from Last 3 Encounters:  11/19/14 50 kg (110 lb 3.7 oz)  11/14/14 46.04 kg (101 lb 8 oz)  11/07/14 48.353 kg (106 lb 9.6 oz)     Intake/Output Summary (Last 24 hours) at 11/21/14 1050 Last data filed at 11/21/14 0700  Gross per 24 hour  Intake 1579.16 ml  Output   1125 ml  Net 454.16 ml    Exam  General: Alert and oriented x  3, NAD  HEENT:  PERRLA, EOMI,   Neck: Supple, no JVD  CVS: S1 S2 clear, RRR  Respiratory:  fairly CTA B   Abdomen: Soft, NT, ND, +PEG  Ext: no cyanosis clubbing or edema  Neuro: no new deficits  Skin: No rashes  Psych: Normal affect and demeanor, alert and oriented x3    Data Review   Micro Results Recent Results (from the past 240 hour(s))  Blood Culture (routine x 2)     Status: None (Preliminary result)   Collection Time: 11/18/14  1:39 PM  Result Value Ref Range Status   Specimen Description BLOOD RIGHT ANTECUBITAL  Final   Special Requests BOTTLES DRAWN AEROBIC AND ANAEROBIC 5ML  Final   Culture   Final    NO GROWTH 2 DAYS Performed at [facility]    Report Status PENDING  Incomplete  Blood Culture (routine x 2)     Status: None (Preliminary result)   Collection Time: 11/18/14  2:10 PM  Result Value Ref Range Status   Specimen Description BLOOD PORTA CATH  Final   Special Requests BLOOD 5CC  Final   Culture   Final    NO GROWTH 2 DAYS Performed at [facility]    Report Status PENDING  Incomplete  Urine culture     Status: None   Collection Time: 11/18/14  3:42 PM  Result Value Ref Range Status   Specimen Description URINE, RANDOM  Final   Special Requests NONE  Final   Culture   Final    4,000 COLONIES/mL INSIGNIFICANT GROWTH Performed at [facility]    Report Status 11/20/2014 FINAL  Final  MRSA PCR Screening     Status: None   Collection Time: 11/18/14  4:11 PM   Result Value Ref Range Status   MRSA by PCR NEGATIVE NEGATIVE Final    Comment:        The GeneXpert MRSA Assay (FDA approved for NASAL specimens only), is one component of a comprehensive MRSA colonization surveillance program. It is not intended to diagnose MRSA infection nor to guide or monitor treatment for MRSA infections.   Culture, sputum-assessment     Status: None   Collection Time: 11/19/14  6:28 PM  Result Value Ref Range Status   Specimen Description Expect. Sput  Final   Special Requests NONE  Final   Sputum evaluation   Final    THIS SPECIMEN IS ACCEPTABLE. RESPIRATORY CULTURE REPORT TO FOLLOW.   Report Status 11/19/2014 FINAL  Final  Culture, respiratory (NON-Expectorated)     Status: None (Preliminary result)   Collection Time: 11/19/14  7:15 PM  Result Value Ref Range Status   Specimen Description SPUTUM  Final   Special Requests NONE  Final   Gram Stain   Final    ABUNDANT WBC PRESENT,BOTH PMN AND MONONUCLEAR RARE SQUAMOUS EPITHELIAL CELLS PRESENT NO ORGANISMS SEEN Performed at [facility]    Culture   Final    FEW GRAM NEGATIVE RODS Performed at [facility]    Report Status PENDING  Incomplete    Radiology Reports Ir Gastrostomy Tube  11/06/2014   CLINICAL DATA:  Metastatic lung carcinoma, weight loss, loss of appetite, dysphagia and poor nutrition. The patient presents for percutaneous gastrostomy tube placement.  EXAM: PERCUTANEOUS GASTROSTOMY TUBE PLACEMENT  ANESTHESIA/SEDATION: 5.0 mg IV Versed; 50 mcg IV Fentanyl.  Total Moderate Sedation Time  30 minutes.  CONTRAST:  14m OMNIPAQUE IOHEXOL 300 MG/ML  SOLN  MEDICATIONS: 1  g IV vancomycin. Vancomycin was given within two hours of incision. Vancomycin was given due to an antibiotic allergy.  FLUOROSCOPY TIME:  11 minutes.  PROCEDURE: The procedure, risks, benefits, and alternatives were explained to the patient. Questions regarding the procedure were encouraged and answered. The  patient understands and consents to the procedure.  A time-out was performed prior to the procedure. A 5-French catheter was then advanced through the the patient's mouth under fluoroscopy into the esophagus and to the level of the stomach. This catheter was used to insufflate the stomach with air under fluoroscopy.  The abdominal wall was prepped with Betadine in a sterile fashion, and a sterile drape was applied covering the operative field. A sterile gown and sterile gloves were used for the procedure. Local anesthesia was provided with 1% Lidocaine.  A skin incision was made in the upper abdominal wall. Under fluoroscopy, an 18 gauge trocar needle was advanced into the stomach. Contrast injection was performed to confirm intraluminal position of the needle tip. A single T tack was then deployed in the lumen of the stomach. This was brought up to tension at the skin surface.  Over a guidewire, a 9-French sheath was advanced into the lumen of the stomach. The wire was left in place as a safety wire. A loop snare device from a percutaneous gastrostomy kit was then advanced into the stomach.  A floppy guide wire was advanced through the orogastric catheter under fluoroscopy in the stomach. The loop snare advanced through the percutaneous gastric access was used to snare the guide wire. This allowed withdrawal of the loop snare out of the patient's mouth by retraction of the orogastric catheter and wire.  A 20-French bumper retention gastrostomy tube was looped around the snare device. It was then pulled back through the patient's mouth. The retention bumper was brought up to the anterior gastric wall. The T tack suture was cut at the skin. The exiting gastrostomy tube was cut to appropriate length and a feeding adapter applied. The catheter was injected with contrast material to confirm position and a fluoroscopic spot image saved. The tube was then flushed with saline. A dressing was applied over the gastrostomy  exit site.  COMPLICATIONS: None.  FINDINGS: The stomach distended well with air allowing safe placement of the gastrostomy tube. After placement, the tip of the gastrostomy tube lies in the body of the stomach.  IMPRESSION: Percutaneous gastrostomy with placement of a 20-French bumper retention tube in the body of the stomach. This tube can be used for percutaneous feeds beginning in 24 hours after placement.   Electronically Signed   By: Aletta Edouard M.D.   On: 11/06/2014 13:01   Dg Esophagus  10/31/2014   CLINICAL DATA:  Dysphagia  EXAM: ESOPHOGRAM/BARIUM SWALLOW  TECHNIQUE: Single contrast examination was performed using  thin barium.  FLUOROSCOPY TIME:  Radiation Exposure Index (as provided by the fluoroscopic device): 36 Gy per sq cm  If the device does not provide the exposure index:  Fluoroscopy Time:  1 minutes 42 seconds  Number of Acquired Images:  COMPARISON:  CT chest of 08/06/2014  FINDINGS: The patient was not able to stand for an adequate length of time, and therefore a lateral series with rapid sequence spot spot films of the cervical esophagus was performed. The swallowing mechanism is unremarkable. No aspiration is seen. There are mild tertiary contractions in the mid and distal esophagus. No definite hiatal hernia is seen. No gastroesophageal reflux is demonstrated.  IMPRESSION: Mild  tertiary contractions in the mid and distal esophagus. No hiatal hernia. No definite reflux.   Electronically Signed   By: Ivar Drape M.D.   On: 10/31/2014 10:39   Dg Chest Port 1 View  11/18/2014   CLINICAL DATA:  Hypotension and fever/chills.  EXAM: PORTABLE CHEST - 1 VIEW  COMPARISON:  09/20/2014  FINDINGS: Dense airspace disease in the right lower lobe. Small right pleural effusion. No evidence of cavitation.  Clear left lung.  Normal heart size and stable aortic contours. Fullness of the right peritracheal stripe most recently evaluated by chest CT 08/06/2014.  Biliary stent pneumobilia.  Right IJ porta  catheter with tip at the upper SVC.  IMPRESSION: Right lower lobe pneumonia with small effusion.   Electronically Signed   By: Monte Fantasia M.D.   On: 11/18/2014 14:02    CBC  Recent Labs Lab 11/14/14 1200 11/18/14 1117  11/18/14 1630 11/18/14 1900 11/19/14 0418 11/19/14 1530 11/20/14 0520 11/21/14 0550  WBC 13.1* 22.1*  --  13.9*  --  12.2*  --  14.1* 14.5*  HGB 8.6* 8.1*  < > 6.1* 6.4* 7.9* 9.1* 9.0* 9.5*  HCT 26.3* 24.8*  < > 19.1* 20.2* 24.4* 28.1* 28.3* 28.7*  PLT 308 335  --  213  --  236  --  210 233  MCV 96.2 96.8  --  98.5  --  95.7  --  94.3 95.7  MCH 31.5 31.7  --  31.4  --  31.0  --  30.0 31.7  MCHC 32.7 32.8  --  31.9  --  32.4  --  31.8 33.1  RDW 15.2* 15.5*  --  15.1  --  16.6*  --  16.4* 16.3*  LYMPHSABS 0.6* 0.8*  --   --   --   --   --   --   --   MONOABS 1.0* 0.9  --   --   --   --   --   --   --   EOSABS 0.0 0.0  --   --   --   --   --   --   --   BASOSABS 0.0 0.1  --   --   --   --   --   --   --   < > = values in this interval not displayed.  Chemistries   Recent Labs Lab 11/14/14 1159 11/14/14 1200 11/18/14 1117 11/18/14 1630 11/19/14 0418 11/20/14 0520 11/21/14 0550  NA 135*  --  136  --  138 138 140  K 4.2  --  4.8  --  3.7 3.2* 3.8  CL  --   --   --   --  111 108 109  CO2 23  --  22  --  21* 23 26  GLUCOSE 181*  --  145*  --  132* 125* 128*  BUN 19.0  --  29.5*  --  23* 17 19  CREATININE 1.0  --  1.3 0.97 0.87 0.76 0.71  CALCIUM 9.4  --  9.3  --  8.1* 8.1* 8.6*  MG  --  2.0  --   --   --   --   --   AST 41*  --  51*  --   --   --   --   ALT 66*  --  66*  --   --   --   --   ALKPHOS 555*  --  442*  --   --   --   --  BILITOT 0.58  --  1.01  --   --   --   --    ------------------------------------------------------------------------------------------------------------------ estimated creatinine clearance is 61.6 mL/min (by C-G formula based on Cr of  0.71). ------------------------------------------------------------------------------------------------------------------ No results for input(s): HGBA1C in the last 72 hours. ------------------------------------------------------------------------------------------------------------------ No results for input(s): CHOL, HDL, LDLCALC, TRIG, CHOLHDL, LDLDIRECT in the last 72 hours. ------------------------------------------------------------------------------------------------------------------ No results for input(s): TSH, T4TOTAL, T3FREE, THYROIDAB in the last 72 hours.  Invalid input(s): FREET3 ------------------------------------------------------------------------------------------------------------------  Recent Labs  11/19/14 0810 11/19/14 0832  VITAMINB12 443  --   FOLATE  --  18.5  FERRITIN 1567*  --   TIBC NOT CALCULATED  --   IRON 23*  --   RETICCTPCT 2.0  --     Coagulation profile No results for input(s): INR, PROTIME in the last 168 hours.  No results for input(s): DDIMER in the last 72 hours.  Cardiac Enzymes No results for input(s): CKMB, TROPONINI, MYOGLOBIN in the last 168 hours.  Invalid input(s): CK ------------------------------------------------------------------------------------------------------------------ Invalid input(s): POCBNP  No results for input(s): GLUCAP in the last 72 hours.   RAI,RIPUDEEP M.D. [facility] 11/21/2014, 10:50 AM  Pager: [phone] Between 7am to 7pm - call Pager - [phone]  After 7pm go to [email or web address] - password [email or web address]  Call night coverage person covering after 7pm

## 2014-11-21 NOTE — Care Management Important Message (Signed)
Important Message  Patient Details  Name: [name] MRN: [id number] Date of Birth: October 17, 1945   Medicare Important Message Given:  [facility] notification given    Camillo Flaming 11/21/2014, 12:49 [place] Message  Patient Details  Name: [name] MRN: [id number] Date of Birth: 01/28/1946   Medicare Important Message Given:  Yes-second notification given    Camillo Flaming 11/21/2014, 12:49 PM

## 2014-11-21 NOTE — Care Management Note (Signed)
Case Management Note  Patient Details  Name: [name] MRN: [id number] Date of Birth: [birth year]  Subjective/Objective:                 69 yo admitted with Sepsis. Hx of CA   Action/Plan: From home with wife  Expected Discharge Date:   (unknown)               Expected Discharge Plan:  [place]  In-House Referral:  NA  Discharge planning Services  CM Consult  Post Acute Care Choice:  Resumption of Svcs/PTA Provider Choice offered to:  Patient  DME Arranged:  N/A DME Agency:  NA  HH Arranged:  RN, PT [place] Agency:  [place]  Status of Service:  In process, will continue to follow  Medicare Important Message Given:  Yes-second notification given Date Medicare IM Given:    Medicare IM give by:    Date Additional Medicare IM Given:    Additional Medicare Important Message give by:     If discussed at [place] of Stay Meetings, dates discussed:    Additional Comments: Pt is currently active with [facility] for [facility] services. PT is recommending HHPT at DC. This CM spoke with pt at bedside about adding PT to his [facility] services. Pt states that he is interested in [facility] sending a HHPT to his home as well. Will need HHRN/PT order at DC. [facility] rep alerted of pt admission and addition of PT services. CM will continue to follow. Lynnell Catalan, RN 11/21/2014, 1:28 PM

## 2014-11-22 ENCOUNTER — Encounter: Payer: Medicare Other | Admitting: Nutrition

## 2014-11-22 ENCOUNTER — Ambulatory Visit: Payer: Medicare Other | Admitting: Physician Assistant

## 2014-11-22 ENCOUNTER — Telehealth: Payer: Self-pay | Admitting: *Deleted

## 2014-11-22 ENCOUNTER — Other Ambulatory Visit: Payer: Medicare Other

## 2014-11-22 ENCOUNTER — Ambulatory Visit: Payer: Medicare Other

## 2014-11-22 LAB — CULTURE, RESPIRATORY

## 2014-11-22 LAB — BASIC METABOLIC PANEL
Anion gap: 6 (ref 5–15)
BUN: 25 mg/dL — AB (ref 6–20)
CO2: 25 mmol/L (ref 22–32)
Calcium: 8.6 mg/dL — ABNORMAL LOW (ref 8.9–10.3)
Chloride: 108 mmol/L (ref 101–111)
Creatinine, Ser: 0.66 mg/dL (ref 0.61–1.24)
GFR calc Af Amer: >60 mL/min (ref >60)
GLUCOSE: 114 mg/dL — AB (ref 65–99)
POTASSIUM: 3.6 mmol/L (ref 3.5–5.1)
Sodium: 139 mmol/L (ref 135–145)

## 2014-11-22 LAB — CBC
HCT: 29.1 % — ABNORMAL LOW (ref 39.0–52.0)
Hemoglobin: 9.5 g/dL — ABNORMAL LOW (ref 13.0–17.0)
MCH: 31.4 pg (ref 26.0–34.0)
MCHC: 32.6 g/dL (ref 30.0–36.0)
MCV: 96 fL (ref 78.0–100.0)
PLATELETS: 242 10*3/uL (ref 150–400)
RBC: 3.03 MIL/uL — AB (ref 4.22–5.81)
RDW: 16.1 % — ABNORMAL HIGH (ref 11.5–15.5)
WBC: 14.8 10*3/uL — ABNORMAL HIGH (ref 4.0–10.5)

## 2014-11-22 LAB — CULTURE, RESPIRATORY W GRAM STAIN

## 2014-11-22 LAB — PROCALCITONIN: Procalcitonin: 4.12 ng/mL

## 2014-11-22 MED ORDER — BENZONATATE 100 MG PO CAPS: 100.0000 mg | ORAL_CAPSULE | Freq: Three times a day (TID) | ORAL | Status: DC | PRN

## 2014-11-22 MED ORDER — SODIUM CHLORIDE 0.9 % IJ SOLN
10.0000 mL | INTRAMUSCULAR | Status: DC | PRN
  Administered 2014-11-22 (×2): 10 mL
  Filled 2014-11-22: qty 40

## 2014-11-22 MED ORDER — CEFUROXIME AXETIL 500 MG PO TABS
500.0000 mg | ORAL_TABLET | Freq: Two times a day (BID) | ORAL | Status: DC
  Administered 2014-11-22: 500 mg via ORAL
  Filled 2014-11-22 (×3): qty 1

## 2014-11-22 MED ORDER — HYDROCOD POLST-CPM POLST ER 10-8 MG/5ML PO SUER: 5.0000 mL | Freq: Two times a day (BID) | ORAL | Status: DC | PRN

## 2014-11-22 MED ORDER — SACCHAROMYCES BOULARDII 250 MG PO CAPS
250.0000 mg | ORAL_CAPSULE | Freq: Two times a day (BID) | ORAL | Status: DC
  Administered 2014-11-22: 250 mg via ORAL
  Filled 2014-11-22: qty 1

## 2014-11-22 MED ORDER — DEXTROSE 5 % IV SOLN: 2.0000 g | Freq: Three times a day (TID) | INTRAVENOUS | Status: DC

## 2014-11-22 MED ORDER — HEPARIN SOD (PORK) LOCK FLUSH 100 UNIT/ML IV SOLN
500.0000 [IU] | INTRAVENOUS | Status: AC | PRN
Start: 2014-11-22 — End: 2014-11-22
  Administered 2014-11-22: 500 [IU]

## 2014-11-22 MED ORDER — DEXTROSE 5 % IV SOLN
2.0000 g | Freq: Three times a day (TID) | INTRAVENOUS | Status: DC
  Administered 2014-11-22: 2 g via INTRAVENOUS
  Filled 2014-11-22 (×2): qty 2

## 2014-11-22 MED ORDER — CEFUROXIME AXETIL 500 MG PO TABS: 500.0000 mg | ORAL_TABLET | Freq: Two times a day (BID) | ORAL | Status: DC

## 2014-11-22 MED ORDER — ONDANSETRON HCL 8 MG PO TABS: 8.0000 mg | ORAL_TABLET | Freq: Three times a day (TID) | ORAL | Status: DC | PRN

## 2014-11-22 MED ORDER — SACCHAROMYCES BOULARDII 250 MG PO CAPS: 250.0000 mg | ORAL_CAPSULE | Freq: Every day | ORAL | Status: DC

## 2014-11-22 NOTE — Progress Notes (Signed)
This CM was notified by Dr. Tana Coast that pt would need to go home on IV cefepime for 7 more days. Per Dr. Tana Coast pt already has a port that can be accessed for IV meds. Pt already active with [facility] for [facility] services. [facility] infusion rep called to alert of new IV abx need at DC. Prescription for medication written by MD. Staff RN alerted that pt should have first dose of IV cefepime here in the hospital prior to DC to make sure no reactions. Cefepime scheduled for 1400.  Marney Doctor RN,BSN,NCM [phone]

## 2014-11-22 NOTE — Telephone Encounter (Addendum)
PAGER: PT.'S [place] RESUMED FOR PT. AT HOME.

## 2014-11-22 NOTE — Discharge Summary (Addendum)
Physician Discharge Summary   Patient ID: [name] MRN: [id number] DOB/AGE: 03-Oct-1945 69 y.o.  Admit date: 11/18/2014 Discharge date: 11/22/2014  Primary Care Physician:  [name], MD  Discharge Diagnoses:    . Sepsis . HCAP (healthcare-associated pneumonia) . Weight loss  dehydration Severe protein calorie malnutrition     . Hypotension . Non-small cell lung cancer . Dehydration  Consults:  none    Recommendations for Outpatient Follow-up:  Patient was placed on cefepime 2 g IV every 8 hours for 7 more days per ID recommendations  TESTS THAT NEED FOLLOW-UP Please obtain chest x-ray in 2-3 weeks to ensure complete resolution of pneumonia    DIET: Soft comfort diet as tolerated with tube feeds    Allergies:   Allergies  Allergen Reactions  . Augmentin [Amoxicillin-Pot Clavulanate] Diarrhea  . Carboplatin     Rash and itching after carbo test dose  . Levaquin [Levofloxacin] Other (See Comments)    Knee pain  . Percocet [Oxycodone-Acetaminophen] Nausea And Vomiting  . Zolpidem Tartrate Diarrhea     Discharge Medications:   Medication List    STOP taking these medications        LORazepam 0.5 MG tablet  Commonly known as:  ATIVAN      TAKE these medications        atenolol 100 MG tablet  Commonly known as:  TENORMIN  Take 50 mg by mouth daily.     benzonatate 100 MG capsule  Commonly known as:  TESSALON  Take 1 capsule (100 mg total) by mouth 3 (three) times daily as needed for cough.     ceFEPIme 2 g in dextrose 5 % 50 mL  Inject 2 g into the vein every 8 (eight) hours. X 7 more days     chlorpheniramine-HYDROcodone 10-8 MG/5ML Suer  Commonly known as:  TUSSIONEX  Take 5 mLs by mouth every 12 (twelve) hours as needed for cough.     feeding supplement (OSMOLITE 1.5 CAL) Liqd  Begin Osmolite 1.5 at 20 ml/hr continuous infusion via PEG for 12 hours overnight from 8 pm to 8 am. Increase 10 cc daily to initial goal of 95 cc/hr for 15  hours daily as tolerated.  Flush feeding tube with 240 cc free water 6 times daily. This provides 100% estimated needs. Please send feeding pump and instruct patient on how to use pump. Send TF supplies. Do not send formula.     ferrous sulfate 325 (65 FE) MG tablet  Take 1 tablet (325 mg total) by mouth 3 (three) times daily with meals.     HYDROmorphone 4 MG tablet  Commonly known as:  DILAUDID  Take 4 mg by mouth every 6 (six) hours as needed for severe pain.     INTEGRA PLUS Caps  Take 1 capsule by mouth every morning.     metoCLOPramide 10 MG tablet  Commonly known as:  REGLAN  Take 1 tablet (10 mg total) by mouth every 8 (eight) hours as needed for nausea.     multivitamin tablet  Take 1 tablet by mouth every morning.     MYRBETRIQ 50 MG Tb24 tablet  Generic drug:  mirabegron ER  Take 50 mg by mouth every evening.     NASACORT ALLERGY 24HR 55 MCG/ACT Aero nasal inhaler  Generic drug:  triamcinolone  Place 1 spray into the nose daily as needed (congestion.).     OLANZapine 10 MG tablet  Commonly known as:  ZYPREXA  TAKE 1 TABLET(10 MG)  BY MOUTH AT BEDTIME     omeprazole 40 MG capsule  Commonly known as:  PRILOSEC  Take 1 capsule (40 mg total) by mouth every evening.     ondansetron 8 MG tablet  Commonly known as:  ZOFRAN  Take 1 tablet (8 mg total) by mouth every 8 (eight) hours as needed for nausea or vomiting.     prochlorperazine 10 MG tablet  Commonly known as:  COMPAZINE  Take 1 tablet (10 mg total) by mouth every 6 (six) hours as needed for nausea or vomiting.     saccharomyces boulardii 250 MG capsule  Commonly known as:  FLORASTOR  Take 1 capsule (250 mg total) by mouth daily. Take any generic probiotic available while on antibiotics     SENOKOT S PO  Take 2 tablets by mouth daily as needed (constipation).         Brief H and P: For complete details please refer to admission H and P, but in brief, Patient is a 69 year old male with lung cancer-  non-small cell, diagnosed in September 2010, currently undergoing chemotherapy, hypertension, anorexia, dysphagia recently had PEG tube placed on 11/06/2014 presented from the [facility] for hypotension. History was obtained from the patient and his wife. Patient reported that He has not been eating well in the last few months, had recent PEG tube placed on 11/06/2014. He started having coughing with clear sputum in the last 2-3 days, worsened overnight with shaking chills, one episode of vomiting. The patient had received chemotherapy last week for the third round. Heart rate was also elevated in 120s. Patient's wife had given him off of his atenolol tabs due to elevated heart rate at 4 AM this morning. Patient had reinitiated his IV fluid hydration at home over the past weekend. At the cancer Center BP was 70/56. ED workup showed BUN 29, creatinine 1.3, sodium 136, WBCs 22.1, hemoglobin 8.1, platelets 335, lactic acid 1.9. Chest x-ray showed right lower lobe pneumonia  Hospital Course:   Sepsis/ SIRS, HCAP (healthcare associated pneumonia): Patient met SIRS criteria with leukocytosis, hypotension, tachycardia, source of infection, pneumonia, possibly aspiration. Chest x-ray showed right lower lobe pneumonia with small effusion. Patient was admitted to stepdown unit. Lactic acid 1.96 at the time of admission, pro-calcitonin 22.9 on admission which improved to 4.12 at the time of discharge. Blood cultures remain negative to date. Urine strep antigen, urine legionella antigen negative. Patient was placed on IV vancomycin and aztreonam.  Sputum culture showed Pseudomonas. The patient had allergy to Levaquin. Discussed in detail with infectious disease, Dr. Graylon Good, recommended IV cefepime for 7 more days outpatient to complete the full course for Pseudomonas pneumonia. Home health RN was arranged for the IV antibiotics. Please check chest x-ray in 2-3 weeks to ensure complete resolution of  pneumonia.  Anemia Patient was noted to have hemoglobin of 6.1 at the time of admission. No obvious GI bleeding or any epistaxis or hemoptysis. Stool occult test however positive, hemolysis panel is negative H&H. stable now at 9.5, was transfused 2 units on 8/23.Patient had a recent endoscopic ultrasound done by Dr. Benson Norway on 08/09/14, which had shown porta hepatis mass consistent with adenocarcinoma. I discussed with Dr. Collene Mares, gastroenterology who state that she spoke with Dr. [name], patient's oncologist and at this time deferred any endoscopy procedures. Continue PPI   Non-small cell lung cancer left, porta hepatis mass, adenocarcinoma - Currently undergoing chemotherapy follow-up outpatient with Dr. [name]    Dehydration, Weight loss, severe malnutrition -  patient is now eating some food and continue tube feeds   Hypotension with history of hypertension - BP now stable, continue atenolol   Dysphagia With severe malnutrition in context of chronic illness, dysphagia, malignancy - Patient has a PEG tube recently placed on 11/06/14, will continue TF Osmolite 1.5. Continue with diet and tube feeds, ensure   Day of Discharge BP 108/66 mmHg  Pulse 100  Temp(Src) 97.8 F (36.6 C) (Oral)  Resp 16  Ht 5' 8"  (1.727 m)  Wt 50 kg (110 lb 3.7 oz)  BMI 16.76 kg/m2  SpO2 97%  Physical Exam: General: Alert and awake oriented x3 not in any acute distress. HEENT: anicteric sclera, pupils reactive to light and accommodation CVS: S1-S2 clear no murmur rubs or gallops Chest: clear to auscultation bilaterally, no wheezing rales or rhonchi Abdomen: soft nontender, nondistended, normal bowel sounds, PEG tube  Extremities: no cyanosis, clubbing or edema noted bilaterally Neuro: Cranial nerves II-XII intact, no focal neurological deficits   The results of significant diagnostics from this hospitalization (including imaging, microbiology, ancillary and laboratory) are listed below for reference.     LAB RESULTS: Basic Metabolic Panel:  Recent Labs Lab 11/21/14 0550 11/22/14 0441  NA 140 139  K 3.8 3.6  CL 109 108  CO2 26 25  GLUCOSE 128* 114*  BUN 19 25*  CREATININE 0.71 0.66  CALCIUM 8.6* 8.6*   Liver Function Tests:  Recent Labs Lab 11/18/14 1117  AST 51*  ALT 66*  ALKPHOS 442*  BILITOT 1.01  PROT 6.1*  ALBUMIN 2.0*   No results for input(s): LIPASE, AMYLASE in the last 168 hours. No results for input(s): AMMONIA in the last 168 hours. CBC:  Recent Labs Lab 11/18/14 1117  11/21/14 0550 11/22/14 0441  WBC 22.1*  < > 14.5* 14.8*  NEUTROABS 20.3*  --   --   --   HGB 8.1*  < > 9.5* 9.5*  HCT 24.8*  < > 28.7* 29.1*  MCV 96.8  < > 95.7 96.0  PLT 335  < > 233 242  < > = values in this interval not displayed. Cardiac Enzymes: No results for input(s): CKTOTAL, CKMB, CKMBINDEX, TROPONINI in the last 168 hours. BNP: Invalid input(s): POCBNP CBG: No results for input(s): GLUCAP in the last 168 hours.  Significant Diagnostic Studies:  Dg Chest Port 1 View  11/18/2014   CLINICAL DATA:  Hypotension and fever/chills.  EXAM: PORTABLE CHEST - 1 VIEW  COMPARISON:  09/20/2014  FINDINGS: Dense airspace disease in the right lower lobe. Small right pleural effusion. No evidence of cavitation.  Clear left lung.  Normal heart size and stable aortic contours. Fullness of the right peritracheal stripe most recently evaluated by chest CT 08/06/2014.  Biliary stent pneumobilia.  Right IJ porta catheter with tip at the upper SVC.  IMPRESSION: Right lower lobe pneumonia with small effusion.   Electronically Signed   By: Monte Fantasia M.D.   On: 11/18/2014 14:02    2D ECHO:   Disposition and Follow-up:    DISPOSITION: Home with home physical therapy , RN   DISCHARGE FOLLOW-UP Follow-up Information    Follow up with [name], MD. Schedule an appointment as soon as possible for a visit in 10 days.   Specialty:  Family Medicine   Why:  for hospital follow-up    Contact information:   [place] [address] [phone]        Time spent on Discharge: 35 minutes  SignedEstill Cotta M.D. [facility] 11/22/2014, 12:24 PM Pager: [phone]

## 2014-11-22 NOTE — Telephone Encounter (Signed)
Spoke with on call md  Dr. Burr Medico.   Called wife back and left message on voice mail re:  Zofran will not be refilled as pt still in the hospital.  Asked wife to call back when pt is discharged home, and Zofran will be refilled for pt as per Dr. Ernestina Penna instructions.

## 2014-11-22 NOTE — Telephone Encounter (Signed)
Received call from wife requesting refill of Zofran.  Noted that pt is currently admitted to hospital.  Will notify on call md to see whether it is appropriate to refill med. Pt's   Phone    [phone].

## 2014-11-23 LAB — CULTURE, BLOOD (ROUTINE X 2)
CULTURE: NO GROWTH
Culture: NO GROWTH

## 2014-11-25 ENCOUNTER — Encounter (HOSPITAL_COMMUNITY): Payer: Self-pay | Admitting: Emergency Medicine

## 2014-11-25 ENCOUNTER — Emergency Department (HOSPITAL_COMMUNITY): Payer: Medicare Other

## 2014-11-25 ENCOUNTER — Inpatient Hospital Stay (HOSPITAL_COMMUNITY)
Admission: EM | Admit: 2014-11-25 | Discharge: 2014-12-03 | DRG: 193 | Disposition: A | Discharge status: Home Health | Payer: Medicare Other | Attending: Internal Medicine | Admitting: Internal Medicine

## 2014-11-25 DIAGNOSIS — A419 Sepsis, unspecified organism: Secondary | ICD-10-CM | POA: Diagnosis present

## 2014-11-25 DIAGNOSIS — C3490 Malignant neoplasm of unspecified part of unspecified bronchus or lung: Secondary | ICD-10-CM | POA: Insufficient documentation

## 2014-11-25 DIAGNOSIS — E860 Dehydration: Secondary | ICD-10-CM | POA: Diagnosis present

## 2014-11-25 DIAGNOSIS — Z87891 Personal history of nicotine dependence: Secondary | ICD-10-CM | POA: Diagnosis not present

## 2014-11-25 DIAGNOSIS — Z9889 Other specified postprocedural states: Secondary | ICD-10-CM | POA: Insufficient documentation

## 2014-11-25 DIAGNOSIS — R197 Diarrhea, unspecified: Secondary | ICD-10-CM | POA: Diagnosis present

## 2014-11-25 DIAGNOSIS — Z681 Body mass index (BMI) 19 or less, adult: Secondary | ICD-10-CM | POA: Diagnosis not present

## 2014-11-25 DIAGNOSIS — Z800 Family history of malignant neoplasm of digestive organs: Secondary | ICD-10-CM | POA: Diagnosis not present

## 2014-11-25 DIAGNOSIS — R54 Age-related physical debility: Secondary | ICD-10-CM | POA: Diagnosis present

## 2014-11-25 DIAGNOSIS — E43 Unspecified severe protein-calorie malnutrition: Secondary | ICD-10-CM | POA: Diagnosis present

## 2014-11-25 DIAGNOSIS — R627 Adult failure to thrive: Secondary | ICD-10-CM | POA: Diagnosis present

## 2014-11-25 DIAGNOSIS — Z885 Allergy status to narcotic agent status: Secondary | ICD-10-CM | POA: Diagnosis not present

## 2014-11-25 DIAGNOSIS — R1900 Intra-abdominal and pelvic swelling, mass and lump, unspecified site: Secondary | ICD-10-CM | POA: Diagnosis present

## 2014-11-25 DIAGNOSIS — Z8249 Family history of ischemic heart disease and other diseases of the circulatory system: Secondary | ICD-10-CM | POA: Diagnosis not present

## 2014-11-25 DIAGNOSIS — Z881 Allergy status to other antibiotic agents status: Secondary | ICD-10-CM

## 2014-11-25 DIAGNOSIS — H9190 Unspecified hearing loss, unspecified ear: Secondary | ICD-10-CM | POA: Diagnosis present

## 2014-11-25 DIAGNOSIS — R0600 Dyspnea, unspecified: Secondary | ICD-10-CM

## 2014-11-25 DIAGNOSIS — Z931 Gastrostomy status: Secondary | ICD-10-CM

## 2014-11-25 DIAGNOSIS — J90 Pleural effusion, not elsewhere classified: Secondary | ICD-10-CM | POA: Diagnosis present

## 2014-11-25 DIAGNOSIS — J189 Pneumonia, unspecified organism: Secondary | ICD-10-CM | POA: Diagnosis present

## 2014-11-25 DIAGNOSIS — Z79899 Other long term (current) drug therapy: Secondary | ICD-10-CM | POA: Diagnosis not present

## 2014-11-25 DIAGNOSIS — E876 Hypokalemia: Secondary | ICD-10-CM | POA: Diagnosis present

## 2014-11-25 DIAGNOSIS — R251 Tremor, unspecified: Secondary | ICD-10-CM | POA: Diagnosis present

## 2014-11-25 DIAGNOSIS — Z888 Allergy status to other drugs, medicaments and biological substances status: Secondary | ICD-10-CM | POA: Diagnosis not present

## 2014-11-25 DIAGNOSIS — C3492 Malignant neoplasm of unspecified part of left bronchus or lung: Secondary | ICD-10-CM | POA: Diagnosis present

## 2014-11-25 DIAGNOSIS — I10 Essential (primary) hypertension: Secondary | ICD-10-CM | POA: Diagnosis present

## 2014-11-25 DIAGNOSIS — J850 Gangrene and necrosis of lung: Secondary | ICD-10-CM | POA: Diagnosis present

## 2014-11-25 DIAGNOSIS — Z79891 Long term (current) use of opiate analgesic: Secondary | ICD-10-CM

## 2014-11-25 DIAGNOSIS — Z66 Do not resuscitate: Secondary | ICD-10-CM | POA: Diagnosis present

## 2014-11-25 DIAGNOSIS — J869 Pyothorax without fistula: Secondary | ICD-10-CM | POA: Diagnosis not present

## 2014-11-25 DIAGNOSIS — C3402 Malignant neoplasm of left main bronchus: Secondary | ICD-10-CM | POA: Diagnosis present

## 2014-11-25 DIAGNOSIS — Y95 Nosocomial condition: Secondary | ICD-10-CM | POA: Diagnosis present

## 2014-11-25 DIAGNOSIS — M1990 Unspecified osteoarthritis, unspecified site: Secondary | ICD-10-CM | POA: Diagnosis present

## 2014-11-25 DIAGNOSIS — C3431 Malignant neoplasm of lower lobe, right bronchus or lung: Secondary | ICD-10-CM | POA: Diagnosis not present

## 2014-11-25 DIAGNOSIS — C7989 Secondary malignant neoplasm of other specified sites: Secondary | ICD-10-CM | POA: Diagnosis present

## 2014-11-25 DIAGNOSIS — N400 Enlarged prostate without lower urinary tract symptoms: Secondary | ICD-10-CM | POA: Diagnosis present

## 2014-11-25 DIAGNOSIS — R509 Fever, unspecified: Secondary | ICD-10-CM | POA: Diagnosis present

## 2014-11-25 LAB — COMPREHENSIVE METABOLIC PANEL
ALK PHOS: 398 U/L — AB (ref 38–126)
ALT: 67 U/L — AB (ref 17–63)
AST: 49 U/L — AB (ref 15–41)
Albumin: 2 g/dL — ABNORMAL LOW (ref 3.5–5.0)
Anion gap: 5 (ref 5–15)
BUN: 26 mg/dL — AB (ref 6–20)
CALCIUM: 8.8 mg/dL — AB (ref 8.9–10.3)
CHLORIDE: 107 mmol/L (ref 101–111)
CO2: 26 mmol/L (ref 22–32)
CREATININE: 0.76 mg/dL (ref 0.61–1.24)
Glucose, Bld: 95 mg/dL (ref 65–99)
Potassium: 3.6 mmol/L (ref 3.5–5.1)
SODIUM: 138 mmol/L (ref 135–145)
Total Bilirubin: 1.1 mg/dL (ref 0.3–1.2)
Total Protein: 5.8 g/dL — ABNORMAL LOW (ref 6.5–8.1)

## 2014-11-25 LAB — CBC WITH DIFFERENTIAL/PLATELET
BASOS PCT: 0 % (ref 0–1)
Basophils Absolute: 0 10*3/uL (ref 0.0–0.1)
EOS ABS: 0.2 10*3/uL (ref 0.0–0.7)
EOS PCT: 2 % (ref 0–5)
HEMATOCRIT: 29 % — AB (ref 39.0–52.0)
Hemoglobin: 9.4 g/dL — ABNORMAL LOW (ref 13.0–17.0)
LYMPHS ABS: 1 10*3/uL (ref 0.7–4.0)
Lymphocytes Relative: 9 % — ABNORMAL LOW (ref 12–46)
MCH: 31.3 pg (ref 26.0–34.0)
MCHC: 32.4 g/dL (ref 30.0–36.0)
MCV: 96.7 fL (ref 78.0–100.0)
MONO ABS: 0.8 10*3/uL (ref 0.1–1.0)
Monocytes Relative: 7 % (ref 3–12)
NEUTROS PCT: 82 % — AB (ref 43–77)
Neutro Abs: 9.5 10*3/uL — ABNORMAL HIGH (ref 1.7–7.7)
PLATELETS: 244 10*3/uL (ref 150–400)
RBC: 3 MIL/uL — ABNORMAL LOW (ref 4.22–5.81)
RDW: 15.5 % (ref 11.5–15.5)
WBC: 11.5 10*3/uL — ABNORMAL HIGH (ref 4.0–10.5)

## 2014-11-25 LAB — URINALYSIS, ROUTINE W REFLEX MICROSCOPIC
Bilirubin Urine: NEGATIVE
GLUCOSE, UA: NEGATIVE mg/dL
KETONES UR: NEGATIVE mg/dL
LEUKOCYTES UA: NEGATIVE
NITRITE: NEGATIVE
PH: 6.5 (ref 5.0–8.0)
Protein, ur: 30 mg/dL — AB
SPECIFIC GRAVITY, URINE: 1.014 (ref 1.005–1.030)
Urobilinogen, UA: 0.2 mg/dL (ref 0.0–1.0)

## 2014-11-25 LAB — I-STAT CG4 LACTIC ACID, ED: Lactic Acid, Venous: 0.69 mmol/L (ref 0.5–2.0)

## 2014-11-25 LAB — I-STAT TROPONIN, ED: Troponin i, poc: 0.07 ng/mL (ref 0.00–0.08)

## 2014-11-25 LAB — URINE MICROSCOPIC-ADD ON: Urine-Other: NONE SEEN

## 2014-11-25 MED ORDER — DEXTROSE 5 % IV SOLN
2.0000 g | Freq: Three times a day (TID) | INTRAVENOUS | Status: DC
  Administered 2014-11-25: 2 g via INTRAVENOUS
  Filled 2014-11-25 (×2): qty 2

## 2014-11-25 MED ORDER — VANCOMYCIN HCL IN DEXTROSE 1-5 GM/200ML-% IV SOLN
1000.0000 mg | INTRAVENOUS | Status: AC
  Administered 2014-11-25: 1000 mg via INTRAVENOUS
  Filled 2014-11-25: qty 200

## 2014-11-25 MED ORDER — IOHEXOL 350 MG/ML SOLN
100.0000 mL | Freq: Once | INTRAVENOUS | Status: AC | PRN
  Administered 2014-11-25: 80 mL via INTRAVENOUS

## 2014-11-25 MED ORDER — VANCOMYCIN HCL IN DEXTROSE 750-5 MG/150ML-% IV SOLN
750.0000 mg | Freq: Two times a day (BID) | INTRAVENOUS | Status: DC
  Filled 2014-11-25: qty 150

## 2014-11-25 MED ORDER — SODIUM CHLORIDE 0.9 % IV BOLUS (SEPSIS)
500.0000 mL | Freq: Once | INTRAVENOUS | Status: AC
  Administered 2014-11-25: 500 mL via INTRAVENOUS

## 2014-11-25 NOTE — Telephone Encounter (Signed)
Wife reported that she did not call about home IV fluids on the 26th. She is giving him water through his G tube and wants to hold off on IV fluids.

## 2014-11-25 NOTE — Telephone Encounter (Signed)
I left message for home health nurse to call back regarding resuming home IVF.

## 2014-11-25 NOTE — H&P (Signed)
[facility] History and Physical  [name] OZH:[id number] DOB: 11-Jul-1945 DOA: 11/25/2014  Referring physician: Quintella Reichert, MD PCP: [name], MD   Chief Complaint: Pneumonia  HPI: [name] is a 69 y.o. male with history of non small cell carcinoma of lung HTN recent admission for sepsis presents with failure of improvement on home IV antibiotics. Patietn had been admitted on 8/22 when he presented with fevers chills and was diagnosed with sepsis. Patient was started on antibiotics and sepsis protocol intiated. He was discharged home on 8/26 with home health and ongoing IV ceftazidime therapy. Patient was to be followed up in the oncology clinic but he presents to the ED with worsening of his symptoms. He did well on Saturday bu his wife states that he had decline on Sunday with more lethargy and confusion. He has noted increased shortness of breath and has had increased sputum production. There has been no hemoptysis noted. There are no fevers noted. No sweating noted. He has had no nausea and no vomiting noted. He has been having periodic localized sharp chest pain on the left side which is chronic for the last 6 months. Patient has had 35-40 pound wight loss since May and he had a PEG tube placed in August. Wife states there has been some drainage from the PEG site. On presentation to the ED he had a CT scan of the chest done for PE protocol and there appears to a necrotizing pneumonia now seen along with a probable empyema. Patient has an increase in the size of the lung mass noted also.   Review of Systems:  Complete systems reviewed and unremarkable other than noted above.   Past Medical History  Diagnosis Date  . Hypertension   . Arthritis RIGHT SHOULDER  . Immature cataract BILATERAL  . Non-small cell lung cancer DX SEPT 2010  W/ CHEMORADIATION AT THAT TIME -- NOW  W/ METS--  CURRENTLY ON MAINTENANCE CHEMO TX  EVERY 30 DAYS    ONCOLOGIST- DR [facility]  .  Acute meniscal tear of knee RIGHT KNEE  . BPH (benign prostatic hypertrophy)   . Dizziness and giddiness 04/23/2014  . Tremor 04/23/2014    Right hand  . HOH (hard of hearing)     bilateral hearing aids  . Radiation 01/08/09-01/21/09    Mediastinum 30 Gy x 12 fractions  . Radiation 08/19/14-09/04/14    Palliative RT Porta hepatic LN 30 Gy   Past Surgical History  Procedure Laterality Date  . 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 Rotator cuff repair  2008    LEFT SHOULDER  . Bilateral ear drum surgery  1960'S  . Orif left ring finger  and revascularization of radial side  02-16-2007    DEGLOVING INJURY  . Knee arthroscopy  12/21/2011    Procedure: ARTHROSCOPY KNEE;  Surgeon: Tobi Bastos, MD;  Location: [facility];  Service: Orthopedics;  Laterality: Right;  WITH MEDIAL MENISECTOMY  . Cataract extraction Bilateral   . Tonsillectomy      as child  . Ovarian cyst surgery      removed from left jaw area- benign  . Ovarian cyst surgery       NOTE; pt unaware of why this is in his chart  . Shoulder open rotator cuff repair Right 06/05/2014    Procedure: RIGHT ROTATOR CUFF REPAIR SHOULDER OPEN;  Surgeon: Latanya Maudlin, MD;  Location: [facility];  Service: Orthopedics;  Laterality: Right;  . Ercp N/A 08/09/2014    Procedure: ENDOSCOPIC RETROGRADE CHOLANGIOPANCREATOGRAPHY (ERCP);  Surgeon: Carol Ada, MD;  Location: [facility];  Service: Endoscopy;  Laterality: N/A;  . Eus N/A 08/09/2014    Procedure: UPPER ENDOSCOPIC ULTRASOUND (EUS) RADIAL;  Surgeon: Carol Ada, MD;  Location: [facility];  Service: Endoscopy;  Laterality: N/A;  . Fine needle aspiration N/A 08/09/2014    Procedure: FINE NEEDLE ASPIRATION (FNA) LINEAR;  Surgeon: Carol Ada, MD;  Location: [facility];  Service: Endoscopy;  Laterality: N/A;  . Esophagogastroduodenoscopy (egd) with propofol N/A 08/09/2014    Procedure:  ESOPHAGOGASTRODUODENOSCOPY (EGD) WITH PROPOFOL;  Surgeon: Carol Ada, MD;  Location: [facility];  Service: Endoscopy;  Laterality: N/A;   Social History:  reports that he quit smoking about 33 years ago. His smoking use included Cigarettes. He has a 40 pack-year smoking history. He has never used smokeless tobacco. He reports that he drinks about 3.5 oz of alcohol per week. He reports that he does not use illicit drugs.  Allergies  Allergen Reactions  . Augmentin [Amoxicillin-Pot Clavulanate] Diarrhea  . Carboplatin     Rash and itching after carbo test dose  . Levaquin [Levofloxacin] Other (See Comments)    Knee pain  . Percocet [Oxycodone-Acetaminophen] Nausea And Vomiting  . Zolpidem Tartrate Diarrhea    Family History  Problem Relation Age of Onset  . Colon cancer    . Heart attack    . Cancer    . Hypertension    . Hyperlipidemia    . Congestive Heart Failure Mother   . Cancer Sister   . Cancer Maternal Grandfather   . Colon cancer Father   . Heart attack Brother      Prior to Admission medications   Medication Sig Start Date End Date Taking? Authorizing Provider  atenolol (TENORMIN) 100 MG tablet Take 50 mg by mouth daily.    Yes Historical Provider, MD  b complex vitamins tablet Take 1 tablet by mouth daily.   Yes Historical Provider, MD  benzonatate (TESSALON) 100 MG capsule Take 1 capsule (100 mg total) by mouth 3 (three) times daily as needed for cough. 11/22/14  Yes Ripudeep K Rai, MD  ceFEPIme 2 g in dextrose 5 % 50 mL Inject 2 g into the vein every 8 (eight) hours. X 7 more days 11/22/14  Yes Ripudeep Krystal Eaton, MD  chlorpheniramine-HYDROcodone (TUSSIONEX) 10-8 MG/5ML SUER Take 5 mLs by mouth every 12 (twelve) hours as needed for cough. 11/22/14  Yes Ripudeep Krystal Eaton, MD  FeFum-FePoly-FA-B Cmp-C-Biot (INTEGRA PLUS) CAPS Take 1 capsule by mouth every morning. 10/24/14  Yes [name], MD  ferrous sulfate 325 (65 FE) MG tablet Take 1 tablet (325 mg total) by mouth 3  (three) times daily with meals. 11/01/14  Yes [name], MD  HYDROmorphone (DILAUDID) 4 MG tablet Take 4 mg by mouth every 6 (six) hours as needed for severe pain.  11/06/14  Yes Historical Provider, MD  metoCLOPramide (REGLAN) 10 MG tablet Take 1 tablet (10 mg total) by mouth every 8 (eight) hours as needed for nausea. 11/15/14  Yes Susanne Borders, NP  Multiple Vitamin (MULTIVITAMIN) tablet Take 1 tablet by mouth every morning.    Yes Historical Provider, MD  MYRBETRIQ 50 MG TB24 tablet Take 50 mg by mouth 2 (two) times daily.  01/09/14  Yes Historical Provider, MD  Nutritional Supplements (FEEDING SUPPLEMENT, OSMOLITE 1.5 CAL,) LIQD Begin Osmolite 1.5 at 20 ml/hr continuous infusion via PEG  for 12 hours overnight from 8 pm to 8 am. Increase 10 cc daily to initial goal of 95 cc/hr for 15 hours daily as tolerated.  Flush feeding tube with 240 cc free water 6 times daily. This provides 100% estimated needs. Please send feeding pump and instruct patient on how to use pump. Send TF supplies. Do not send formula. 11/07/14  Yes [name], MD  OLANZapine (ZYPREXA) 10 MG tablet TAKE 1 TABLET(10 MG) BY MOUTH AT BEDTIME 11/15/14  Yes Susanne Borders, NP  omeprazole (PRILOSEC) 40 MG capsule Take 1 capsule (40 mg total) by mouth every evening. 11/07/14  Yes Adrena E Johnson, PA-C  ondansetron (ZOFRAN) 8 MG tablet Take 1 tablet (8 mg total) by mouth every 8 (eight) hours as needed for nausea or vomiting. 11/22/14  Yes Ripudeep Krystal Eaton, MD  [place] [facility].   Yes Historical Provider, MD  prochlorperazine (COMPAZINE) 10 MG tablet Take 1 tablet (10 mg total) by mouth every 6 (six) hours as needed for nausea or vomiting. 10/15/14  Yes [name], MD  saccharomyces boulardii (FLORASTOR) 250 MG capsule Take 1 capsule (250 mg total) by mouth daily. Take any generic probiotic available while on antibiotics 11/22/14  Yes Ripudeep Krystal Eaton, MD  Sennosides-Docusate Sodium (SENOKOT S PO) Take 2 tablets  by mouth daily as needed (constipation).    Yes Historical Provider, MD  triamcinolone (NASACORT ALLERGY 24HR) 55 MCG/ACT AERO nasal inhaler Place 1 spray into the nose daily as needed (congestion.).   Yes Historical Provider, MD   Physical Exam: Filed Vitals:   11/25/14 2000 11/25/14 2030 11/25/14 2100 11/25/14 2244  BP: 125/85 115/70 120/67 118/72  Pulse: 74 71 69 75  Temp:      TempSrc:      Resp: '21 22 18 25  '$ SpO2: 97% 99% 96% 98%    Wt Readings from Last 3 Encounters:  11/19/14 50 kg (110 lb 3.7 oz)  11/14/14 46.04 kg (101 lb 8 oz)  11/07/14 48.353 kg (106 lb 9.6 oz)    General:  Appears calm and comfortable Eyes: PERRL, normal lids, irises & conjunctiva ENT: grossly normal hearing, lips & tongue Neck: no LAD, masses or thyromegaly Cardiovascular: RRR, no m/r/g. No LE edema Respiratory:no w/r/r. Normal respiratory effort. Abdomen: soft, PEG site covered with bandage Skin: no rash or induration seen on limited exam Musculoskeletal: grossly normal tone BUE/BLE Psychiatric: grossly appears to be appropriate Neurologic: grossly non-focal.          Labs on Admission:  Basic Metabolic Panel:  Recent Labs Lab 11/19/14 0418 11/20/14 0520 11/21/14 0550 11/22/14 0441 11/25/14 1741  NA 138 138 140 139 138  K 3.7 3.2* 3.8 3.6 3.6  CL 111 108 109 108 107  CO2 21* '23 26 25 26  '$ GLUCOSE 132* 125* 128* 114* 95  BUN 23* 17 19 25* 26*  CREATININE 0.87 0.76 0.71 0.66 0.76  CALCIUM 8.1* 8.1* 8.6* 8.6* 8.8*   Liver Function Tests:  Recent Labs Lab 11/25/14 1741  AST 49*  ALT 67*  ALKPHOS 398*  BILITOT 1.1  PROT 5.8*  ALBUMIN 2.0*   No results for input(s): LIPASE, AMYLASE in the last 168 hours. No results for input(s): AMMONIA in the last 168 hours. CBC:  Recent Labs Lab 11/19/14 0418 11/19/14 1530 11/20/14 0520 11/21/14 0550 11/22/14 0441 11/25/14 1741  WBC 12.2*  --  14.1* 14.5* 14.8* 11.5*  NEUTROABS  --   --   --   --   --  9.5*  HGB 7.9* 9.1* 9.0*  9.5* 9.5* 9.4*  HCT 24.4* 28.1* 28.3* 28.7* 29.1* 29.0*  MCV 95.7  --  94.3 95.7 96.0 96.7  PLT 236  --  210 233 242 244   Cardiac Enzymes: No results for input(s): CKTOTAL, CKMB, CKMBINDEX, TROPONINI in the last 168 hours.  BNP (last 3 results) No results for input(s): BNP in the last 8760 hours.  ProBNP (last 3 results) No results for input(s): PROBNP in the last 8760 hours.  CBG: No results for input(s): GLUCAP in the last 168 hours.  Radiological Exams on Admission: Dg Chest 2 View  11/25/2014   CLINICAL DATA:  Pneumonia with worsening shortness of Breath. Confusion. History of non-small cell lung cancer.  EXAM: CHEST  2 VIEW  COMPARISON:  11/18/2014  FINDINGS: The cardiac silhouette, mediastinal and hilar contours are stable. Persistent right lower lobe pneumonia. Stable underlying emphysematous changes. The power port is stable.  IMPRESSION: Persistent right lower lobe pneumonia.   Electronically Signed   By: Marijo Sanes M.D.   On: 11/25/2014 18:24   Ct Angio Chest Pe W/cm &/or Wo Cm  11/25/2014   CLINICAL DATA:  Acute onset of shortness of breath, productive cough and confusion. Initial encounter.  EXAM: CT ANGIOGRAPHY CHEST WITH CONTRAST  TECHNIQUE: Multidetector CT imaging of the chest was performed using the standard protocol during bolus administration of intravenous contrast. Multiplanar CT image reconstructions and MIPs were obtained to evaluate the vascular anatomy.  CONTRAST:  71m OMNIPAQUE IOHEXOL 350 MG/ML SOLN  COMPARISON:  Chest radiograph performed earlier today at at 5:44 p.m., and CT of the chest performed 08/06/2014  FINDINGS: There is no evidence of pulmonary embolus.  Dense right lower lobe pneumonia is noted. Pockets of fluid within the right lower lobe raise concern for necrotizing pneumonia. A loculated small right-sided pleural effusion is noted, with peripheral enhancement, likely reflecting underlying empyema.  Scarring and calcification noted at the medial  aspect of the right lung, and the left lung apex. Scattered blebs are noted in the periphery of the left lung. There is no evidence of pneumothorax.  A right paratracheal mass has increased in size, measuring 5.1 x 4.3 cm, compatible with recurrent malignancy. Additional enlarged right paratracheal and subcarinal nodes measure up to 1.5 cm in short axis. Trace pericardial fluid remains within normal limits. Diffuse coronary artery calcifications seen. Scattered calcification is noted along the aortic arch and proximal great vessels. No axillary lymphadenopathy is seen. The thyroid gland is diminutive and unremarkable in appearance. A right-sided chest port is noted ending about the distal SVC.  The visualized portions of the liver and spleen are unremarkable. A common bile duct stent is partially imaged, with underlying pneumobilia.  No acute osseous abnormalities are seen.  Review of the MIP images confirms the above findings.  IMPRESSION: 1. No evidence of pulmonary embolus. 2. Dense right lower lobe pneumonia noted. Pockets of fluid within the right lower lobe raise concern for necrotizing pneumonia. Underlying loculated peripherally enhancing small right pleural effusion seen, likely reflecting associated empyema. 3. Right paratracheal mass has increased in size, measuring 5.1 x 4.3 cm, compatible with recurrent malignancy. Additional enlarged mediastinal nodes likely also reflect nodal metastases. 4. Diffuse coronary artery calcifications seen. 5. Common bile duct stent, with underlying pneumobilia. These results were called by telephone at the time of interpretation on 11/25/2014 at 10:05 pm to Dr. EQuintella Reichert who verbally acknowledged these results.   Electronically Signed   By: JFrancoise SchaumannD.  On: 11/25/2014 22:07     Assessment/Plan Principal Problem:   Sepsis Active Problems:   Bronchogenic cancer of left lung   Hypertension   HCAP (healthcare-associated pneumonia)   Necrotizing  pneumonia   1. Sepsis/possible empyema -sepsis protocol initiated -Will get cultures procalcitonin lactate -will continue with ceftaz and add clindamycin as there is suspicion for necrotizing pneumonia along with empyema -will start on fluid protocol hold antihypertensives for now  2. HCAP/Necrotizing Pneumonia -started on clindamycin in addition to ceftaz -as above cultures ordered  3. Bronchogenic carcinoma of the Left Lung -patient prognosis is poor with evidence of enlargement of the mass noted on todays CT scan -oncology to follow patient -would consider palliative care evalaution  4. Hypertension -hold antihypertensives as pressures are soft  5. Dehydration -started on fluids as above    Code Status: DNR (I have discussed the code status with the patient and his wife. He is very clear and she is in agreement with his choice to be a DNR) DVT Prophylaxis:heparin Family Communication: none (indicate person spoken with, if applicable, with phone number if by telephone) Disposition Plan: home (indicate anticipated LOS)    [facility] Pager [phone]

## 2014-11-25 NOTE — ED Provider Notes (Signed)
CSN: [id number]     Arrival date & time 11/25/14  1650 History   First MD Initiated Contact with Patient 11/25/14 1713     Chief Complaint  Patient presents with  . Shortness of Breath  . Altered Mental Status     The history is provided by the patient and the spouse. No language interpreter was used.   [name] presents for evaluation of shortness of breath. He has a history of non-small cell lung cancer currently under treatment by Dr. [name]. He was recently discharged from the hospital a few days ago for low blood pressures and shortness of breath and treated for age. Since discharge she reports worsening condition with increased cough productive of green sputum, generalized weakness. His wife states he is confused at times. There is no report of fever, vomiting, diarrhea at home. He has home health that comes out to his house and provides IV fluids daily and he is on 3 times a day IV antibiotics. He is receiving these medications without improvement. He told home health today to stop providing the IV fluids because he feels they are of no benefit.  Past Medical History  Diagnosis Date  . Hypertension   . Arthritis RIGHT SHOULDER  . Immature cataract BILATERAL  . Non-small cell lung cancer DX SEPT 2010  W/ CHEMORADIATION AT THAT TIME -- NOW  W/ METS--  CURRENTLY ON MAINTENANCE CHEMO TX  EVERY 30 DAYS    ONCOLOGIST- DR [facility]  . Acute meniscal tear of knee RIGHT KNEE  . BPH (benign prostatic hypertrophy)   . Dizziness and giddiness 04/23/2014  . Tremor 04/23/2014    Right hand  . HOH (hard of hearing)     bilateral hearing aids  . Radiation 01/08/09-01/21/09    Mediastinum 30 Gy x 12 fractions  . Radiation 08/19/14-09/04/14    Palliative RT Porta hepatic LN 30 Gy   Past Surgical History  Procedure Laterality Date  . 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 Rotator cuff repair  2008    LEFT SHOULDER  .  Bilateral ear drum surgery  1960'S  . Orif left ring finger  and revascularization of radial side  02-16-2007    DEGLOVING INJURY  . Knee arthroscopy  12/21/2011    Procedure: ARTHROSCOPY KNEE;  Surgeon: Tobi Bastos, MD;  Location: [facility];  Service: Orthopedics;  Laterality: Right;  WITH MEDIAL MENISECTOMY  . Cataract extraction Bilateral   . Tonsillectomy      as child  . Ovarian cyst surgery      removed from left jaw area- benign  . Ovarian cyst surgery       NOTE; pt unaware of why this is in his chart  . Shoulder open rotator cuff repair Right 06/05/2014    Procedure: RIGHT ROTATOR CUFF REPAIR SHOULDER OPEN;  Surgeon: Latanya Maudlin, MD;  Location: [facility];  Service: Orthopedics;  Laterality: Right;  . Ercp N/A 08/09/2014    Procedure: ENDOSCOPIC RETROGRADE CHOLANGIOPANCREATOGRAPHY (ERCP);  Surgeon: Carol Ada, MD;  Location: [facility];  Service: Endoscopy;  Laterality: N/A;  . Eus N/A 08/09/2014    Procedure: UPPER ENDOSCOPIC ULTRASOUND (EUS) RADIAL;  Surgeon: Carol Ada, MD;  Location: [facility];  Service: Endoscopy;  Laterality: N/A;  . Fine needle aspiration N/A 08/09/2014    Procedure: FINE NEEDLE ASPIRATION (FNA) LINEAR;  Surgeon: Carol Ada, MD;  Location: [facility];  Service: Endoscopy;  Laterality: N/A;  . Esophagogastroduodenoscopy (egd) with propofol N/A 08/09/2014    Procedure: ESOPHAGOGASTRODUODENOSCOPY (EGD) WITH PROPOFOL;  Surgeon: Carol Ada, MD;  Location: [facility];  Service: Endoscopy;  Laterality: N/A;   Family History  Problem Relation Age of Onset  . Colon cancer    . Heart attack    . Cancer    . Hypertension    . Hyperlipidemia    . Congestive Heart Failure Mother   . Cancer Sister   . Cancer Maternal Grandfather   . Colon cancer Father   . Heart attack Brother    Social History  Substance Use Topics  . Smoking status: Former Smoker -- 2.00 packs/day for 20 years    Types: Cigarettes    Quit date: 03/17/1981  .  Smokeless tobacco: Never Used  . Alcohol Use: 3.5 oz/week    7 Standard drinks or equivalent per week     Comment: beer daily    Review of Systems  All other systems reviewed and are negative.     Allergies  Augmentin; Carboplatin; Levaquin; Percocet; and Zolpidem tartrate  Home Medications   Prior to Admission medications   Medication Sig Start Date End Date Taking? Authorizing Provider  atenolol (TENORMIN) 100 MG tablet Take 50 mg by mouth daily.    Yes Historical Provider, MD  b complex vitamins tablet Take 1 tablet by mouth daily.   Yes Historical Provider, MD  benzonatate (TESSALON) 100 MG capsule Take 1 capsule (100 mg total) by mouth 3 (three) times daily as needed for cough. 11/22/14  Yes Ripudeep K Rai, MD  ceFEPIme 2 g in dextrose 5 % 50 mL Inject 2 g into the vein every 8 (eight) hours. X 7 more days 11/22/14  Yes Ripudeep Krystal Eaton, MD  chlorpheniramine-HYDROcodone (TUSSIONEX) 10-8 MG/5ML SUER Take 5 mLs by mouth every 12 (twelve) hours as needed for cough. 11/22/14  Yes Ripudeep Krystal Eaton, MD  FeFum-FePoly-FA-B Cmp-C-Biot (INTEGRA PLUS) CAPS Take 1 capsule by mouth every morning. 10/24/14  Yes [name], MD  ferrous sulfate 325 (65 FE) MG tablet Take 1 tablet (325 mg total) by mouth 3 (three) times daily with meals. 11/01/14  Yes [name], MD  HYDROmorphone (DILAUDID) 4 MG tablet Take 4 mg by mouth every 6 (six) hours as needed for severe pain.  11/06/14  Yes Historical Provider, MD  metoCLOPramide (REGLAN) 10 MG tablet Take 1 tablet (10 mg total) by mouth every 8 (eight) hours as needed for nausea. 11/15/14  Yes Susanne Borders, NP  Multiple Vitamin (MULTIVITAMIN) tablet Take 1 tablet by mouth every morning.    Yes Historical Provider, MD  MYRBETRIQ 50 MG TB24 tablet Take 50 mg by mouth 2 (two) times daily.  01/09/14  Yes Historical Provider, MD  Nutritional Supplements (FEEDING SUPPLEMENT, OSMOLITE 1.5 CAL,) LIQD Begin Osmolite 1.5 at 20 ml/hr continuous infusion via PEG for  12 hours overnight from 8 pm to 8 am. Increase 10 cc daily to initial goal of 95 cc/hr for 15 hours daily as tolerated.  Flush feeding tube with 240 cc free water 6 times daily. This provides 100% estimated needs. Please send feeding pump and instruct patient on how to use pump. Send TF supplies. Do not send formula. 11/07/14  Yes [name], MD  OLANZapine (ZYPREXA) 10 MG tablet TAKE 1 TABLET(10 MG) BY MOUTH AT BEDTIME 11/15/14  Yes Susanne Borders, NP  omeprazole (PRILOSEC) 40 MG capsule Take 1 capsule (40 mg total) by mouth every evening. 11/07/14  Yes Carlton Adam, PA-C  ondansetron (ZOFRAN) 8 MG tablet Take 1 tablet (8 mg total) by mouth every 8 (eight) hours as needed for nausea or vomiting. 11/22/14  Yes Ripudeep Krystal Eaton, MD  [facility].   Yes Historical Provider, MD  prochlorperazine (COMPAZINE) 10 MG tablet Take 1 tablet (10 mg total) by mouth every 6 (six) hours as needed for nausea or vomiting. 10/15/14  Yes [name], MD  saccharomyces boulardii (FLORASTOR) 250 MG capsule Take 1 capsule (250 mg total) by mouth daily. Take any generic probiotic available while on antibiotics 11/22/14  Yes Ripudeep Krystal Eaton, MD  Sennosides-Docusate Sodium (SENOKOT S PO) Take 2 tablets by mouth daily as needed (constipation).    Yes Historical Provider, MD  triamcinolone (NASACORT ALLERGY 24HR) 55 MCG/ACT AERO nasal inhaler Place 1 spray into the nose daily as needed (congestion.).   Yes Historical Provider, MD   BP 126/75 mmHg  Pulse 77  Temp(Src) 97.5 F (36.4 C) (Oral)  Resp 20  SpO2 97% Physical Exam  Constitutional: He is oriented to person, place, and time. He appears well-developed.  Frail  HENT:  Head: Normocephalic and atraumatic.  Cardiovascular: Normal rate and regular rhythm.   No murmur heard. Pulmonary/Chest: Effort normal.  Slightly decreased air movement bilaterally. Port right anterior chest wall.  Abdominal: Soft. There is no tenderness. There is no  rebound and no guarding.  Musculoskeletal: He exhibits no edema or tenderness.  Neurological: He is alert and oriented to person, place, and time.  Skin: Skin is warm and dry.  Psychiatric: He has a normal mood and affect. His behavior is normal.  Nursing note and vitals reviewed.   ED Course  Procedures (including critical care time) Labs Review Labs Reviewed  COMPREHENSIVE METABOLIC PANEL - Abnormal; Notable for the following:    BUN 26 (*)    Calcium 8.8 (*)    Total Protein 5.8 (*)    Albumin 2.0 (*)    AST 49 (*)    ALT 67 (*)    Alkaline Phosphatase 398 (*)    All other components within normal limits  CBC WITH DIFFERENTIAL/PLATELET - Abnormal; Notable for the following:    WBC 11.5 (*)    RBC 3.00 (*)    Hemoglobin 9.4 (*)    HCT 29.0 (*)    Neutrophils Relative % 82 (*)    Lymphocytes Relative 9 (*)    Neutro Abs 9.5 (*)    All other components within normal limits  URINALYSIS, ROUTINE W REFLEX MICROSCOPIC (NOT AT [facility]) - Abnormal; Notable for the following:    APPearance CLOUDY (*)    Hgb urine dipstick SMALL (*)    Protein, ur 30 (*)    All other components within normal limits  CULTURE, BLOOD (ROUTINE X 2)  CULTURE, BLOOD (ROUTINE X 2)  URINE MICROSCOPIC-ADD ON  I-STAT TROPOININ, ED  I-STAT CG4 LACTIC ACID, ED    Imaging Review Dg Chest 2 View  11/25/2014   CLINICAL DATA:  Pneumonia with worsening shortness of Breath. Confusion. History of non-small cell lung cancer.  EXAM: CHEST  2 VIEW  COMPARISON:  11/18/2014  FINDINGS: The cardiac silhouette, mediastinal and hilar contours are stable. Persistent right lower lobe pneumonia. Stable underlying emphysematous changes. The power port is stable.  IMPRESSION: Persistent right lower lobe pneumonia.   Electronically Signed   By: Marijo Sanes M.D.   On: 11/25/2014 18:24   Ct Angio Chest Pe W/cm &/or Wo Cm  11/25/2014   CLINICAL DATA:  Acute onset of shortness of breath, productive cough and confusion. Initial  encounter.  EXAM: CT ANGIOGRAPHY CHEST WITH CONTRAST  TECHNIQUE: Multidetector CT imaging of the chest was performed using the standard protocol during bolus administration of intravenous contrast. Multiplanar CT image reconstructions and MIPs were obtained to evaluate the vascular anatomy.  CONTRAST:  51m OMNIPAQUE IOHEXOL 350 MG/ML SOLN  COMPARISON:  Chest radiograph performed earlier today at at 5:44 p.m., and CT of the chest performed 08/06/2014  FINDINGS: There is no evidence of pulmonary embolus.  Dense right lower lobe pneumonia is noted. Pockets of fluid within the right lower lobe raise concern for necrotizing pneumonia. A loculated small right-sided pleural effusion is noted, with peripheral enhancement, likely reflecting underlying empyema.  Scarring and calcification noted at the medial aspect of the right lung, and the left lung apex. Scattered blebs are noted in the periphery of the left lung. There is no evidence of pneumothorax.  A right paratracheal mass has increased in size, measuring 5.1 x 4.3 cm, compatible with recurrent malignancy. Additional enlarged right paratracheal and subcarinal nodes measure up to 1.5 cm in short axis. Trace pericardial fluid remains within normal limits. Diffuse coronary artery calcifications seen. Scattered calcification is noted along the aortic arch and proximal great vessels. No axillary lymphadenopathy is seen. The thyroid gland is diminutive and unremarkable in appearance. A right-sided chest port is noted ending about the distal SVC.  The visualized portions of the liver and spleen are unremarkable. A common bile duct stent is partially imaged, with underlying pneumobilia.  No acute osseous abnormalities are seen.  Review of the MIP images confirms the above findings.  IMPRESSION: 1. No evidence of pulmonary embolus. 2. Dense right lower lobe pneumonia noted. Pockets of fluid within the right lower lobe raise concern for necrotizing pneumonia. Underlying  loculated peripherally enhancing small right pleural effusion seen, likely reflecting associated empyema. 3. Right paratracheal mass has increased in size, measuring 5.1 x 4.3 cm, compatible with recurrent malignancy. Additional enlarged mediastinal nodes likely also reflect nodal metastases. 4. Diffuse coronary artery calcifications seen. 5. Common bile duct stent, with underlying pneumobilia. These results were called by telephone at the time of interpretation on 11/25/2014 at 10:05 pm to Dr. EQuintella Reichert who verbally acknowledged these results.   Electronically Signed   By: JGarald BaldingM.D.   On: 11/25/2014 22:07   I have personally reviewed and evaluated these images and lab results as part of my medical decision-making.   EKG Interpretation   Date/Time:  Monday November 25 2014 17:09:40 EDT Ventricular Rate:  73 PR Interval:  191 QRS Duration: 79 QT Interval:  392 QTC Calculation: 432 R Axis:   71 Text Interpretation:  Sinus rhythm Anteroseptal infarct, age indeterminate  borderline ST elevation in inferior leads Confirmed by RHazle Coca([phone]  on 11/25/2014 6:16:30 PM      MDM   Final diagnoses:  Necrotizing pneumonia    Patient with non-small cell lung cancer here for progressive cough and shortness of breath, currently on IV antibiotics at home for pneumonia. CT scan demonstrates necrotizing pneumonia with possible empyema and progression of his disease. Will broaden antibiotics and include vancomycin. D/w hospitalist regarding admission for further tmt.     EQuintella Reichert MD 11/26/14 [phone]

## 2014-11-25 NOTE — ED Notes (Signed)
Pt presents via POV accompanied by wife. Wife states pt was d/c from hospital last Friday for pneumonia. Pt c/o increased SOB with mobility, cough with green sputum. Pt is alert with confusion. Oriented to self, situation, place. Confused about date. Pt denies pain, fever.

## 2014-11-26 ENCOUNTER — Inpatient Hospital Stay (HOSPITAL_COMMUNITY): Payer: Medicare Other

## 2014-11-26 DIAGNOSIS — E43 Unspecified severe protein-calorie malnutrition: Secondary | ICD-10-CM | POA: Diagnosis present

## 2014-11-26 LAB — CBC WITH DIFFERENTIAL/PLATELET
BASOS ABS: 0 10*3/uL (ref 0.0–0.1)
Basophils Relative: 0 % (ref 0–1)
Eosinophils Absolute: 0.2 10*3/uL (ref 0.0–0.7)
Eosinophils Relative: 2 % (ref 0–5)
HEMATOCRIT: 28.3 % — AB (ref 39.0–52.0)
HEMOGLOBIN: 9.2 g/dL — AB (ref 13.0–17.0)
LYMPHS PCT: 8 % — AB (ref 12–46)
Lymphs Abs: 0.9 10*3/uL (ref 0.7–4.0)
MCH: 31.4 pg (ref 26.0–34.0)
MCHC: 32.5 g/dL (ref 30.0–36.0)
MCV: 96.6 fL (ref 78.0–100.0)
MONOS PCT: 6 % (ref 3–12)
Monocytes Absolute: 0.7 10*3/uL (ref 0.1–1.0)
Neutro Abs: 9.2 10*3/uL — ABNORMAL HIGH (ref 1.7–7.7)
Neutrophils Relative %: 84 % — ABNORMAL HIGH (ref 43–77)
Platelets: 229 10*3/uL (ref 150–400)
RBC: 2.93 MIL/uL — AB (ref 4.22–5.81)
RDW: 15.5 % (ref 11.5–15.5)
WBC: 11 10*3/uL — AB (ref 4.0–10.5)

## 2014-11-26 LAB — COMPREHENSIVE METABOLIC PANEL
ALBUMIN: 1.8 g/dL — AB (ref 3.5–5.0)
ALBUMIN: 2 g/dL — AB (ref 3.5–5.0)
ALK PHOS: 367 U/L — AB (ref 38–126)
ALT: 64 U/L — ABNORMAL HIGH (ref 17–63)
ALT: 65 U/L — AB (ref 17–63)
AST: 47 U/L — AB (ref 15–41)
AST: 46 U/L — AB (ref 15–41)
Alkaline Phosphatase: 376 U/L — ABNORMAL HIGH (ref 38–126)
Anion gap: 4 — ABNORMAL LOW (ref 5–15)
Anion gap: 7 (ref 5–15)
BILIRUBIN TOTAL: 1 mg/dL (ref 0.3–1.2)
BUN: 21 mg/dL — AB (ref 6–20)
BUN: 21 mg/dL — AB (ref 6–20)
CHLORIDE: 107 mmol/L (ref 101–111)
CO2: 25 mmol/L (ref 22–32)
CO2: 25 mmol/L (ref 22–32)
CREATININE: 0.76 mg/dL (ref 0.61–1.24)
Calcium: 8.7 mg/dL — ABNORMAL LOW (ref 8.9–10.3)
Calcium: 8.9 mg/dL (ref 8.9–10.3)
Chloride: 107 mmol/L (ref 101–111)
Creatinine, Ser: 0.7 mg/dL (ref 0.61–1.24)
GFR calc Af Amer: >60 mL/min (ref >60)
GFR calc Af Amer: >60 mL/min (ref >60)
GFR calc non Af Amer: >60 mL/min (ref >60)
GLUCOSE: 91 mg/dL (ref 65–99)
GLUCOSE: 96 mg/dL (ref 65–99)
POTASSIUM: 3.6 mmol/L (ref 3.5–5.1)
Potassium: 3.6 mmol/L (ref 3.5–5.1)
SODIUM: 139 mmol/L (ref 135–145)
Sodium: 136 mmol/L (ref 135–145)
TOTAL PROTEIN: 5.9 g/dL — AB (ref 6.5–8.1)
Total Bilirubin: 1.3 mg/dL — ABNORMAL HIGH (ref 0.3–1.2)
Total Protein: 6.1 g/dL — ABNORMAL LOW (ref 6.5–8.1)

## 2014-11-26 LAB — APTT: aPTT: 38 seconds — ABNORMAL HIGH (ref 24–37)

## 2014-11-26 LAB — CBC
HCT: 29.5 % — ABNORMAL LOW (ref 39.0–52.0)
Hemoglobin: 9.3 g/dL — ABNORMAL LOW (ref 13.0–17.0)
MCH: 30.4 pg (ref 26.0–34.0)
MCHC: 31.5 g/dL (ref 30.0–36.0)
MCV: 96.4 fL (ref 78.0–100.0)
PLATELETS: 239 10*3/uL (ref 150–400)
RBC: 3.06 MIL/uL — AB (ref 4.22–5.81)
RDW: 15.4 % (ref 11.5–15.5)
WBC: 12.4 10*3/uL — AB (ref 4.0–10.5)

## 2014-11-26 LAB — STREP PNEUMONIAE URINARY ANTIGEN: STREP PNEUMO URINARY ANTIGEN: NEGATIVE

## 2014-11-26 LAB — PROCALCITONIN: Procalcitonin: 0.99 ng/mL

## 2014-11-26 LAB — LACTIC ACID, PLASMA
Lactic Acid, Venous: 0.9 mmol/L (ref 0.5–2.0)
Lactic Acid, Venous: 1 mmol/L (ref 0.5–2.0)

## 2014-11-26 LAB — CBG MONITORING, ED: Glucose-Capillary: 96 mg/dL (ref 65–99)

## 2014-11-26 LAB — HIV ANTIBODY (ROUTINE TESTING W REFLEX): HIV Screen 4th Generation wRfx: NONREACTIVE

## 2014-11-26 LAB — PROTIME-INR
INR: 1.36 (ref 0.00–1.49)
PROTHROMBIN TIME: 16.9 s — AB (ref 11.6–15.2)

## 2014-11-26 MED ORDER — METOCLOPRAMIDE HCL 10 MG PO TABS
10.0000 mg | ORAL_TABLET | Freq: Three times a day (TID) | ORAL | Status: DC | PRN
  Administered 2014-11-30 – 2014-12-01 (×2): 10 mg via ORAL
  Filled 2014-11-26 (×2): qty 1

## 2014-11-26 MED ORDER — CLINDAMYCIN PHOSPHATE 600 MG/50ML IV SOLN
600.0000 mg | Freq: Four times a day (QID) | INTRAVENOUS | Status: DC
  Administered 2014-11-26 – 2014-12-03 (×30): 600 mg via INTRAVENOUS
  Filled 2014-11-26 (×29): qty 50

## 2014-11-26 MED ORDER — ACETAMINOPHEN 325 MG PO TABS
650.0000 mg | ORAL_TABLET | Freq: Four times a day (QID) | ORAL | Status: DC | PRN
  Administered 2014-11-30 – 2014-12-03 (×4): 650 mg via ORAL
  Filled 2014-11-26 (×4): qty 2

## 2014-11-26 MED ORDER — SODIUM CHLORIDE 0.9 % IV BOLUS (SEPSIS): 500.0000 mL | INTRAVENOUS | Status: AC

## 2014-11-26 MED ORDER — FERROUS SULFATE 325 (65 FE) MG PO TABS
325.0000 mg | ORAL_TABLET | Freq: Three times a day (TID) | ORAL | Status: DC
  Administered 2014-11-26 – 2014-12-03 (×20): 325 mg via ORAL
  Filled 2014-11-26 (×22): qty 1

## 2014-11-26 MED ORDER — SODIUM CHLORIDE 0.9 % IJ SOLN: 3.0000 mL | Freq: Two times a day (BID) | INTRAMUSCULAR | Status: DC

## 2014-11-26 MED ORDER — HYDROCODONE-ACETAMINOPHEN 5-325 MG PO TABS: 1.0000 | ORAL_TABLET | ORAL | Status: DC | PRN

## 2014-11-26 MED ORDER — BENZONATATE 100 MG PO CAPS
100.0000 mg | ORAL_CAPSULE | Freq: Three times a day (TID) | ORAL | Status: DC | PRN
  Administered 2014-11-26 – 2014-12-03 (×6): 100 mg via ORAL
  Filled 2014-11-26 (×6): qty 1

## 2014-11-26 MED ORDER — VANCOMYCIN HCL IN DEXTROSE 750-5 MG/150ML-% IV SOLN
750.0000 mg | Freq: Two times a day (BID) | INTRAVENOUS | Status: DC
  Administered 2014-11-26 – 2014-11-28 (×5): 750 mg via INTRAVENOUS
  Filled 2014-11-26 (×5): qty 150

## 2014-11-26 MED ORDER — DEXTROSE 5 % IV SOLN: 2.0000 g | Freq: Three times a day (TID) | INTRAVENOUS | Status: DC

## 2014-11-26 MED ORDER — FLUTICASONE PROPIONATE 50 MCG/ACT NA SUSP: 2.0000 | Freq: Every day | NASAL | Status: DC | PRN

## 2014-11-26 MED ORDER — ADULT MULTIVITAMIN W/MINERALS CH
1.0000 | ORAL_TABLET | Freq: Every day | ORAL | Status: DC
  Administered 2014-11-26 – 2014-12-03 (×8): 1 via ORAL
  Filled 2014-11-26 (×9): qty 1

## 2014-11-26 MED ORDER — SODIUM CHLORIDE 0.9 % IV SOLN
INTRAVENOUS | Status: DC
  Administered 2014-11-26 – 2014-11-27 (×2): via INTRAVENOUS

## 2014-11-26 MED ORDER — SODIUM CHLORIDE 0.9 % IV BOLUS (SEPSIS)
1000.0000 mL | Freq: Once | INTRAVENOUS | Status: AC
  Administered 2014-11-26: 1000 mL via INTRAVENOUS

## 2014-11-26 MED ORDER — HYDROCOD POLST-CPM POLST ER 10-8 MG/5ML PO SUER
5.0000 mL | Freq: Two times a day (BID) | ORAL | Status: DC | PRN
  Administered 2014-11-26 – 2014-12-02 (×7): 5 mL via ORAL
  Filled 2014-11-26 (×8): qty 5

## 2014-11-26 MED ORDER — OSMOLITE 1.5 CAL PO LIQD
1000.0000 mL | ORAL | Status: DC
  Administered 2014-11-26: 1000 mL
  Filled 2014-11-26: qty 1000

## 2014-11-26 MED ORDER — PANTOPRAZOLE SODIUM 40 MG PO TBEC
40.0000 mg | DELAYED_RELEASE_TABLET | Freq: Every day | ORAL | Status: DC
  Administered 2014-11-26 – 2014-12-03 (×8): 40 mg via ORAL
  Filled 2014-11-26 (×9): qty 1

## 2014-11-26 MED ORDER — OLANZAPINE 5 MG PO TABS
10.0000 mg | ORAL_TABLET | Freq: Every day | ORAL | Status: DC
  Administered 2014-11-26 – 2014-12-02 (×7): 10 mg via ORAL
  Filled 2014-11-26 (×7): qty 2

## 2014-11-26 MED ORDER — SACCHAROMYCES BOULARDII 250 MG PO CAPS
250.0000 mg | ORAL_CAPSULE | Freq: Every day | ORAL | Status: DC
  Administered 2014-11-26 – 2014-12-03 (×8): 250 mg via ORAL
  Filled 2014-11-26 (×8): qty 1

## 2014-11-26 MED ORDER — ONDANSETRON HCL 4 MG/2ML IJ SOLN
4.0000 mg | Freq: Four times a day (QID) | INTRAMUSCULAR | Status: DC | PRN
  Administered 2014-11-30 – 2014-12-03 (×6): 4 mg via INTRAVENOUS
  Filled 2014-11-26 (×6): qty 2

## 2014-11-26 MED ORDER — POLYETHYLENE GLYCOL 3350 17 G PO PACK
17.0000 g | PACK | Freq: Every day | ORAL | Status: DC | PRN
  Filled 2014-11-26: qty 1

## 2014-11-26 MED ORDER — FOLIC ACID 1 MG PO TABS
1.0000 mg | ORAL_TABLET | Freq: Every day | ORAL | Status: DC
  Administered 2014-11-26 – 2014-12-03 (×8): 1 mg via ORAL
  Filled 2014-11-26 (×8): qty 1

## 2014-11-26 MED ORDER — ONDANSETRON HCL 4 MG PO TABS: 4.0000 mg | ORAL_TABLET | Freq: Four times a day (QID) | ORAL | Status: DC | PRN

## 2014-11-26 MED ORDER — B COMPLEX-C PO TABS
1.0000 | ORAL_TABLET | Freq: Every day | ORAL | Status: DC
  Administered 2014-11-26 – 2014-12-03 (×8): 1 via ORAL
  Filled 2014-11-26 (×9): qty 1

## 2014-11-26 MED ORDER — VITAMIN B-1 100 MG PO TABS
100.0000 mg | ORAL_TABLET | Freq: Every day | ORAL | Status: DC
  Administered 2014-11-26 – 2014-12-03 (×8): 100 mg via ORAL
  Filled 2014-11-26 (×8): qty 1

## 2014-11-26 MED ORDER — MORPHINE SULFATE (PF) 2 MG/ML IV SOLN: 2.0000 mg | INTRAVENOUS | Status: DC | PRN

## 2014-11-26 MED ORDER — ACETAMINOPHEN 650 MG RE SUPP: 650.0000 mg | Freq: Four times a day (QID) | RECTAL | Status: DC | PRN

## 2014-11-26 MED ORDER — MIRABEGRON ER 50 MG PO TB24
50.0000 mg | ORAL_TABLET | Freq: Two times a day (BID) | ORAL | Status: DC
  Administered 2014-11-26 – 2014-12-03 (×14): 50 mg via ORAL
  Filled 2014-11-26 (×19): qty 1

## 2014-11-26 MED ORDER — DEXTROSE 5 % IV SOLN
2.0000 g | Freq: Three times a day (TID) | INTRAVENOUS | Status: DC
  Administered 2014-11-26 – 2014-12-03 (×22): 2 g via INTRAVENOUS
  Filled 2014-11-26 (×23): qty 2

## 2014-11-26 MED ORDER — DOCUSATE SODIUM 100 MG PO CAPS
100.0000 mg | ORAL_CAPSULE | Freq: Two times a day (BID) | ORAL | Status: DC
  Administered 2014-11-26 – 2014-11-28 (×5): 100 mg via ORAL
  Filled 2014-11-26 (×8): qty 1

## 2014-11-26 NOTE — Progress Notes (Signed)
Pt in ED waiting for transfer to stepdown. Bed ready but PCCM consulted in ED and feels pt can transfer to floor. Dr [name] in agreement. ICU waiting on order for floor transfer to be written. Dr [name] paged 3 times over 1 hour time frame. At 1340 received verbal order from Marni Griffon NP for PCCM to transfer to floor. ED Charge nurse aware of order change. At 1342  Dr. [name] returned page, was in patient room during time trying to reach him.

## 2014-11-26 NOTE — Consult Note (Signed)
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 Recently admitted for pseudomonas PNA. Readmitted 8/28 w/  Lethargy, confusion, increased SOB and  sputum production. PCCM asked to see re  CT scan findings of the chest done for PE protocol suggested a necrotizing pneumonia now seen along with a probable empyema.  SIGNIFICANT EVENTS    STUDIES:  CT scan 8/29: 1. No evidence of pulmonary embolus.2. Dense right lower lobe pneumonia noted. Pockets of fluid within the right lower lobe raise concern for necrotizing pneumonia.Underlying loculated peripherally enhancing small right pleural effusion seen, likely reflecting associated empyema.3. Right paratracheal mass has increased in size, measuring 5.1 x 4.3 cm, compatible with recurrent malignancy. Additional enlarged mediastinal nodes likely also reflect nodal metastases. 4. Diffuse coronary artery calcifications seen. 5. 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 Recently admitted for pseudomonas PNA. He was discharged home on 8/26 with home health and ongoing IV ceftazidime therapy. Patient was to be followed up in the oncology clinic but he presented 8/28 2 day decline which included:  more lethargy and confusion, increased shortness of breath and has had increased sputum production. CT scan of the chest done for PE protocol and there appears to a necrotizing pneumonia now seen along with a probable empyema. Patient has an increase in the size of the lung mass  noted also. PCCM was asked to see re: the complicated pleural effusion    PAST MEDICAL HISTORY :   has a past medical history of Hypertension; Arthritis (RIGHT SHOULDER); Immature cataract (BILATERAL); Non-small cell lung cancer (DX SEPT 2010  W/ CHEMORADIATION AT THAT TIME -- NOW  W/ METS--  CURRENTLY ON MAINTENANCE CHEMO TX  EVERY 30 DAYS); Acute meniscal tear of knee (RIGHT KNEE); BPH (benign prostatic hypertrophy); Dizziness and giddiness (04/23/2014); Tremor (04/23/2014); HOH (hard of hearing); Radiation (01/08/09-01/21/09); and Radiation (08/19/14-09/04/14).  has past surgical history that includes Amputation finger / thumb (02-17-2007); Rotator cuff repair (2008); BILATERAL EAR DRUM SURGERY (1960'S); ORIF LEFT RING FINGER  AND REVASCULARIZATION OF RADIAL SIDE (02-16-2007); Knee arthroscopy (12/21/2011); Cataract extraction (Bilateral); Tonsillectomy; Ovarian cyst surgery; Ovarian cyst surgery; Shoulder open rotator cuff repair (Right, 06/05/2014); ERCP (N/A, 08/09/2014); EUS (N/A, 08/09/2014); Fine needle aspiration (N/A, 08/09/2014); and Esophagogastroduodenoscopy (egd) with propofol (N/A, 08/09/2014). Prior to Admission medications   Medication Sig Start Date End Date Taking? Authorizing Provider  atenolol (TENORMIN) 100 MG tablet Take 50 mg by mouth daily.    Yes Historical Provider, MD  b complex vitamins tablet Take 1 tablet by mouth daily.   Yes Historical Provider, MD  benzonatate (TESSALON) 100 MG capsule Take 1 capsule (100 mg total) by mouth 3 (three) times daily as needed for cough. 11/22/14  Yes Ripudeep K Rai, MD  ceFEPIme 2 g in dextrose 5 % 50 mL Inject 2 g into the vein every 8 (eight) hours. X 7 more days 11/22/14  Yes Ripudeep Krystal Eaton, MD  chlorpheniramine-HYDROcodone (TUSSIONEX) 10-8 MG/5ML SUER Take 5 mLs by mouth  every 12 (twelve) hours as needed for cough. 11/22/14  Yes Ripudeep Krystal Eaton, MD  FeFum-FePoly-FA-B Cmp-C-Biot (INTEGRA PLUS) CAPS Take 1 capsule by mouth every morning. 10/24/14  Yes  [name], MD  ferrous sulfate 325 (65 FE) MG tablet Take 1 tablet (325 mg total) by mouth 3 (three) times daily with meals. 11/01/14  Yes [name], MD  HYDROmorphone (DILAUDID) 4 MG tablet Take 4 mg by mouth every 6 (six) hours as needed for severe pain.  11/06/14  Yes Historical Provider, MD  metoCLOPramide (REGLAN) 10 MG tablet Take 1 tablet (10 mg total) by mouth every 8 (eight) hours as needed for nausea. 11/15/14  Yes Susanne Borders, NP  Multiple Vitamin (MULTIVITAMIN) tablet Take 1 tablet by mouth every morning.    Yes Historical Provider, MD  MYRBETRIQ 50 MG TB24 tablet Take 50 mg by mouth 2 (two) times daily.  01/09/14  Yes Historical Provider, MD  Nutritional Supplements (FEEDING SUPPLEMENT, OSMOLITE 1.5 CAL,) LIQD Begin Osmolite 1.5 at 20 ml/hr continuous infusion via PEG for 12 hours overnight from 8 pm to 8 am. Increase 10 cc daily to initial goal of 95 cc/hr for 15 hours daily as tolerated.  Flush feeding tube with 240 cc free water 6 times daily. This provides 100% estimated needs. Please send feeding pump and instruct patient on how to use pump. Send TF supplies. Do not send formula. 11/07/14  Yes [name], MD  OLANZapine (ZYPREXA) 10 MG tablet TAKE 1 TABLET(10 MG) BY MOUTH AT BEDTIME 11/15/14  Yes Susanne Borders, NP  omeprazole (PRILOSEC) 40 MG capsule Take 1 capsule (40 mg total) by mouth every evening. 11/07/14  Yes Adrena E Johnson, PA-C  ondansetron (ZOFRAN) 8 MG tablet Take 1 tablet (8 mg total) by mouth every 8 (eight) hours as needed for nausea or vomiting. 11/22/14  Yes Ripudeep Krystal Eaton, MD  [place] [facility].   Yes Historical Provider, MD  prochlorperazine (COMPAZINE) 10 MG tablet Take 1 tablet (10 mg total) by mouth every 6 (six) hours as needed for nausea or vomiting. 10/15/14  Yes [name], MD  saccharomyces boulardii (FLORASTOR) 250 MG capsule Take 1 capsule (250 mg total) by mouth daily. Take any generic probiotic available while on  antibiotics 11/22/14  Yes Ripudeep Krystal Eaton, MD  Sennosides-Docusate Sodium (SENOKOT S PO) Take 2 tablets by mouth daily as needed (constipation).    Yes Historical Provider, MD  triamcinolone (NASACORT ALLERGY 24HR) 55 MCG/ACT AERO nasal inhaler Place 1 spray into the nose daily as needed (congestion.).   Yes Historical Provider, MD   Allergies  Allergen Reactions  . Augmentin [Amoxicillin-Pot Clavulanate] Diarrhea  . Carboplatin     Rash and itching after carbo test dose  . Levaquin [Levofloxacin] Other (See Comments)    Knee pain  . Percocet [Oxycodone-Acetaminophen] Nausea And Vomiting  . Zolpidem Tartrate Diarrhea    FAMILY HISTORY:  family history includes Cancer in his maternal grandfather, sister, and another family member; Colon cancer in his father and another family member; Congestive Heart Failure in his mother; Heart attack in his brother and another family member; Hyperlipidemia in an other family member; Hypertension in an other family member. SOCIAL HISTORY:  reports that he quit smoking about 33 years ago. His smoking use included Cigarettes. He has a 40 pack-year smoking history. He has never used smokeless tobacco. He reports that he drinks about 3.5 oz of alcohol per week. He reports that he does not use illicit drugs.  REVIEW OF SYSTEMS:   Constitutional: Negative for fever, chills, weight loss, malaise/+ fatigue and diaphoresis.  HENT: Negative for hearing loss, ear pain, nosebleeds, congestion, sore throat, neck pain, tinnitus and ear discharge.   Eyes: Negative for blurred vision, double vision, photophobia, pain, discharge and redness.  Respiratory: + cough, hemoptysis, sputum production, yellow green, shortness of breath, wheezing and stridor.   Cardiovascular: Negative for chest pain (left sided), palpitations, orthopnea, claudication, leg swelling and PND.  Gastrointestinal: Negative for heartburn, nausea, vomiting, abdominal pain, diarrhea, constipation, blood in  stool and melena.  Genitourinary: Negative for dysuria, urgency, frequency, hematuria and flank pain.  Musculoskeletal: Negative for myalgias, back pain, joint pain and falls.  Skin: Negative for itching and rash.  Neurological: Negative for dizziness, tingling, tremors, sensory change, speech change, focal weakness, seizures, loss of consciousness, weakness and headaches.  Endo/Heme/Allergies: Negative for environmental allergies and polydipsia. Does not bruise/bleed easily.  SUBJECTIVE:  No distress but confused.  VITAL SIGNS: Temp:  [97.5 F (36.4 C)-98.2 F (36.8 C)] 98.2 F (36.8 C) (08/30 0339) Pulse Rate:  [69-97] 76 (08/30 0906) Resp:  [13-25] 13 (08/30 0906) BP: (85-130)/(56-85) 122/76 mmHg (08/30 0906) SpO2:  [81 %-99 %] 96 % (08/30 0906)  PHYSICAL EXAMINATION: General:  Frail 69 year old male in no distress but confused at times  Neuro:  Awake, oriented X 2, moves all ext  HEENT:  Temporal wasting MMM  Cardiovascular:  rrr Lungs:  Decreased on right, no accessory muscle use  Abdomen:  Soft, non-tender  Musculoskeletal:  Intact  Skin:  Intact    Recent Labs Lab 11/25/14 1741 11/26/14 0300 11/26/14 0458  NA 138 136 139  K 3.6 3.6 3.6  CL 107 107 107  CO2 '26 25 25  '$ BUN 26* 21* 21*  CREATININE 0.76 0.70 0.76  GLUCOSE 95 91 96    Recent Labs Lab 11/25/14 1741 11/26/14 0300 11/26/14 0458  HGB 9.4* 9.2* 9.3*  HCT 29.0* 28.3* 29.5*  WBC 11.5* 11.0* 12.4*  PLT 244 229 239   Dg Chest 2 View  11/25/2014   CLINICAL DATA:  Pneumonia with worsening shortness of Breath. Confusion. History of non-small cell lung cancer.  EXAM: CHEST  2 VIEW  COMPARISON:  11/18/2014  FINDINGS: The cardiac silhouette, mediastinal and hilar contours are stable. Persistent right lower lobe pneumonia. Stable underlying emphysematous changes. The power port is stable.  IMPRESSION: Persistent right lower lobe pneumonia.   Electronically Signed   By: Marijo Sanes M.D.   On: 11/25/2014 18:24    Ct Angio Chest Pe W/cm &/or Wo Cm  11/25/2014   CLINICAL DATA:  Acute onset of shortness of breath, productive cough and confusion. Initial encounter.  EXAM: CT ANGIOGRAPHY CHEST WITH CONTRAST  TECHNIQUE: Multidetector CT imaging of the chest was performed using the standard protocol during bolus administration of intravenous contrast. Multiplanar CT image reconstructions and MIPs were obtained to evaluate the vascular anatomy.  CONTRAST:  40m OMNIPAQUE IOHEXOL 350 MG/ML SOLN  COMPARISON:  Chest radiograph performed earlier today at at 5:44 p.m., and CT of the chest performed 08/06/2014  FINDINGS: There is no evidence of pulmonary embolus.  Dense right lower lobe pneumonia is noted. Pockets of fluid within the right lower lobe raise concern for necrotizing pneumonia. A loculated small right-sided pleural effusion is noted, with peripheral enhancement, likely reflecting underlying empyema.  Scarring and calcification noted at the medial aspect of the right lung, and the left lung apex. Scattered blebs are noted in the periphery  of the left lung. There is no evidence of pneumothorax.  A right paratracheal mass has increased in size, measuring 5.1 x 4.3 cm, compatible with recurrent malignancy. Additional enlarged right paratracheal and subcarinal nodes measure up to 1.5 cm in short axis. Trace pericardial fluid remains within normal limits. Diffuse coronary artery calcifications seen. Scattered calcification is noted along the aortic arch and proximal great vessels. No axillary lymphadenopathy is seen. The thyroid gland is diminutive and unremarkable in appearance. A right-sided chest port is noted ending about the distal SVC.  The visualized portions of the liver and spleen are unremarkable. A common bile duct stent is partially imaged, with underlying pneumobilia.  No acute osseous abnormalities are seen.  Review of the MIP images confirms the above findings.  IMPRESSION: 1. No evidence of pulmonary embolus.  2. Dense right lower lobe pneumonia noted. Pockets of fluid within the right lower lobe raise concern for necrotizing pneumonia. Underlying loculated peripherally enhancing small right pleural effusion seen, likely reflecting associated empyema. 3. Right paratracheal mass has increased in size, measuring 5.1 x 4.3 cm, compatible with recurrent malignancy. Additional enlarged mediastinal nodes likely also reflect nodal metastases. 4. Diffuse coronary artery calcifications seen. 5. Common bile duct stent, with underlying pneumobilia. These results were called by telephone at the time of interpretation on 11/25/2014 at 10:05 pm to Dr. Quintella Reichert, who verbally acknowledged these results.   Electronically Signed   By: Garald Balding M.D.   On: 11/25/2014 22:07   Dg Chest Port 1 View  11/26/2014   CLINICAL DATA:  Sepsis  EXAM: PORTABLE CHEST - 1 VIEW  COMPARISON:  11/25/2014  FINDINGS: There is a right jugular Port-A-Cath with tip in the low SVC. There is consolidation in the right base, not significantly changed. The left lung remains clear. Pulmonary vasculature is normal. Right peritracheal mass again noted, unchanged.  IMPRESSION: Unchanged right base consolidation. Unchanged right peritracheal mass.   Electronically Signed   By: Andreas Newport M.D.   On: 11/26/2014 04:22    ASSESSMENT / PLAN:  Necrotizing PNA w/ complicated right pleural effusion.  NSCL CA: stage IV on immunotherapy and palliative XRT to abd mass.  >has isolated pseudomonas but clinically declined on monotherapy for this. ? Polymicrobial??. He has a loculated fluid collection easily seen by [place] at beside. He does not want aggressive rx. He is not a VATS candidate. 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  Plan Wide abx (clinda already added, will add vanc) Will try diagnostic thora (if he  agrees). If appears exudative we will try to drain dry. We could also consider CT guided drainage if exudate, but not sure that he will agree to this.  Either way will need prolonged abx course (likely excess of 4 weeks) May need to consider early palliative involvement    [facility] [place] Pager: [phone]  11/26/2014, 10:25 AM

## 2014-11-26 NOTE — Progress Notes (Signed)
Pt does not need telemetry Would not want intervention if there were to be sudden cardiac issues Wife requests that he be on the oncology floor. I think that seems very reasonable & have changed this request.    Erick Colace ACNP-BC [place] Pager # [phone] OR # [phone] if no answer

## 2014-11-26 NOTE — Progress Notes (Signed)
Pharmacy Consult Note - Vancomycin Initial Dose  A/P: Patient already known to pharmacy for dosing of ceftazidime and clindamycin. To add vancomycin per pharmacy per CCM orders. Per current weight and renal function, start vancomycin '750mg'$  IV q12  Adrian Saran, PharmD, BCPS Pager [phone] 11/26/2014 11:21 AM

## 2014-11-26 NOTE — Progress Notes (Signed)
[facility]  Patient Status:  Active pt with [facility] prior to this admission  [facility] is providing the following services: HHRN and [facility] for home IV ABX and home IV hydration.  [facility] hospital team will follow and support DC to home when ordered.  If patient discharges after hours, please call [phone].   Larry Sierras 11/26/2014, 9:41 AM

## 2014-11-26 NOTE — Progress Notes (Signed)
ANTIBIOTIC CONSULT NOTE - INITIAL  Pharmacy Consult for Fortaz/Clindamycin Indication: pneumonia  Allergies  Allergen Reactions  . Augmentin [Amoxicillin-Pot Clavulanate] Diarrhea  . Carboplatin     Rash and itching after carbo test dose  . Levaquin [Levofloxacin] Other (See Comments)    Knee pain  . Percocet [Oxycodone-Acetaminophen] Nausea And Vomiting  . Zolpidem Tartrate Diarrhea    Patient Measurements:   Wt=50kg  Vital Signs: Temp: 98.2 F (36.8 C) (08/30 0339) Temp Source: Oral (08/30 0339) BP: 102/60 mmHg (08/30 0400) Pulse Rate: 73 (08/30 0400) Intake/Output from previous day:   Intake/Output from this shift:    Labs:  Recent Labs  11/25/14 1741 11/26/14 0300  WBC 11.5* 11.0*  HGB 9.4* 9.2*  PLT 244 229  CREATININE 0.76 0.70   Estimated Creatinine Clearance: 61.6 mL/min (by C-G formula based on Cr of 0.7). No results for input(s): VANCOTROUGH, VANCOPEAK, VANCORANDOM, GENTTROUGH, GENTPEAK, GENTRANDOM, TOBRATROUGH, TOBRAPEAK, TOBRARND, AMIKACINPEAK, AMIKACINTROU, AMIKACIN in the last 72 hours.   Microbiology: Recent Results (from the past 720 hour(s))  Blood Culture (routine x 2)     Status: None   Collection Time: 11/18/14  1:39 PM  Result Value Ref Range Status   Specimen Description BLOOD RIGHT ANTECUBITAL  Final   Special Requests BOTTLES DRAWN AEROBIC AND ANAEROBIC 5ML  Final   Culture   Final    NO GROWTH 5 DAYS Performed at [facility]    Report Status 11/23/2014 FINAL  Final  Blood Culture (routine x 2)     Status: None   Collection Time: 11/18/14  2:10 PM  Result Value Ref Range Status   Specimen Description BLOOD PORTA CATH  Final   Special Requests BLOOD 5CC  Final   Culture   Final    NO GROWTH 5 DAYS Performed at [facility]    Report Status 11/23/2014 FINAL  Final  Urine culture     Status: None   Collection Time: 11/18/14  3:42 PM  Result Value Ref Range Status   Specimen Description URINE, RANDOM  Final   Special Requests NONE  Final   Culture   Final    4,000 COLONIES/mL INSIGNIFICANT GROWTH Performed at [facility]    Report Status 11/20/2014 FINAL  Final  MRSA PCR Screening     Status: None   Collection Time: 11/18/14  4:11 PM  Result Value Ref Range Status   MRSA by PCR NEGATIVE NEGATIVE Final    Comment:        The GeneXpert MRSA Assay (FDA approved for NASAL specimens only), is one component of a comprehensive MRSA colonization surveillance program. It is not intended to diagnose MRSA infection nor to guide or monitor treatment for MRSA infections.   Culture, sputum-assessment     Status: None   Collection Time: 11/19/14  6:28 PM  Result Value Ref Range Status   Specimen Description Expect. Sput  Final   Special Requests NONE  Final   Sputum evaluation   Final    THIS SPECIMEN IS ACCEPTABLE. RESPIRATORY CULTURE REPORT TO FOLLOW.   Report Status 11/19/2014 FINAL  Final  Culture, respiratory (NON-Expectorated)     Status: None   Collection Time: 11/19/14  7:15 PM  Result Value Ref Range Status   Specimen Description SPUTUM  Final   Special Requests NONE  Final   Gram Stain   Final    ABUNDANT WBC PRESENT,BOTH PMN AND MONONUCLEAR RARE SQUAMOUS EPITHELIAL CELLS PRESENT NO ORGANISMS SEEN Performed at [facility]  Culture   Final    FEW PSEUDOMONAS AERUGINOSA Performed at [facility]    Report Status 11/22/2014 FINAL  Final   Organism ID, Bacteria PSEUDOMONAS AERUGINOSA  Final      Susceptibility   Pseudomonas aeruginosa - MIC*    CEFEPIME 2 SENSITIVE Sensitive     CEFTAZIDIME 4 SENSITIVE Sensitive     CIPROFLOXACIN <=0.25 SENSITIVE Sensitive     GENTAMICIN <=1 SENSITIVE Sensitive     IMIPENEM 2 SENSITIVE Sensitive     PIP/TAZO 8 SENSITIVE Sensitive     TOBRAMYCIN <=1 SENSITIVE Sensitive     * FEW PSEUDOMONAS AERUGINOSA    Medical History: Past Medical History  Diagnosis Date  . Hypertension   . Arthritis RIGHT SHOULDER  .  Immature cataract BILATERAL  . Non-small cell lung cancer DX SEPT 2010  W/ CHEMORADIATION AT THAT TIME -- NOW  W/ METS--  CURRENTLY ON MAINTENANCE CHEMO TX  EVERY 30 DAYS    ONCOLOGIST- [facility]  . Acute meniscal tear of knee RIGHT KNEE  . BPH (benign prostatic hypertrophy)   . Dizziness and giddiness 04/23/2014  . Tremor 04/23/2014    Right hand  . HOH (hard of hearing)     bilateral hearing aids  . Radiation 01/08/09-01/21/09    Mediastinum 30 Gy x 12 fractions  . Radiation 08/19/14-09/04/14    Palliative RT Porta hepatic LN 30 Gy    Medications:   (Not in a hospital admission) Scheduled:  . B-complex with vitamin C  1 tablet Oral Daily  . docusate sodium  100 mg Oral BID  . ferrous sulfate  325 mg Oral TID WC  . folic acid  1 mg Oral Daily  . mirabegron ER  50 mg Oral BID  . multivitamin with minerals  1 tablet Oral Daily  . OLANZapine  10 mg Oral QHS  . pantoprazole  40 mg Oral Daily  . saccharomyces boulardii  250 mg Oral Daily  . sodium chloride  3 mL Intravenous Q12H  . thiamine  100 mg Oral Daily   Infusions:  . sodium chloride    . cefTAZidime (FORTAZ)  IV    . clindamycin (CLEOCIN) IV    . feeding supplement (OSMOLITE 1.5 CAL)    . sodium chloride     Followed by  . sodium chloride     Assessment: 57 yoM with hx non-small cell Lung Ca d/c'd from hospital 8/26 with PNA.  Now with increased SOB with mobility and cough with Kelven Flater sputum.  Fortaz and clindamycin for possible necrotizing PNA.   Goal of Therapy:  Treat PNA  Plan:   Tressie Ellis 2Gm IV q8h  Clindamycin '600mg'$  IV q6h  F/u Scr/cultures  Dorrene German 11/26/2014,4:44 AM

## 2014-11-26 NOTE — Progress Notes (Signed)
PROGRESS NOTE  [name] ZOX:[id number] DOB: 1945/12/04 DOA: 11/25/2014 PCP: [name], MD  HPI/Recap of past 47 hours: 69 year old male with past mental history of left-sided bronchogenic lung cancer recently discharged from the hospital on IV antibiotics for pseudomonal sepsis from pneumonia, return back on 8/29 for progressively worsening shortness of breath and found to have signs consistent with necrotizing pneumonia/possible empyema. Patient admitted to hospitalist service, started on IV antibiotics., Pulmonary consulted  Sepsis able to be ruled out. Despite necrotizing pneumonia, patient not hypoxic, breathing on room air. Overall cough and very weak. Get short of breath when he starts to cough. Seen by pulmonary who planned for thoracentesis on 8/31  Assessment/Plan:   Bronchogenic cancer of left lung: Mass seen on CT larger. We'll consult palliative care   Hypertension   HCAP (healthcare-associated pneumonia): See below, continue antibiotics   Necrotizing pneumonia/loculated pleural effusion: Ultrasound guided thoracentesis planned for tomorrow by pulmonary and if needed, chest tube   Protein-calorie malnutrition, severe: Receiving nutrition through PEG tube   Code Status: DO NOT RESUSCITATE  Family Communication: Wife present  Disposition Plan: Anticipate several days treating pleural effusion, hopefully not empyema. Palliative care for goals of therapy   Consultants:  Pulmonary  Palliative care  Procedures:  Plan thoracentesis 8/31  Antibiotics:  IV Tressie Ellis 8/24-present (started since previous admission)  IV vancomycin 8/29-present   Objective: BP 122/78 mmHg  Pulse 78  Temp(Src) 97.6 F (36.4 C) (Oral)  Resp 18  Ht '5\' 8"'$  (1.727 m)  Wt 48.444 kg (106 lb 12.8 oz)  BMI 16.24 kg/m2  SpO2 95%  Intake/Output Summary (Last 24 hours) at 11/26/14 1922 Last data filed at 11/26/14 1024  Gross per 24 hour  Intake    200 ml  Output    200 ml  Net       0 ml   Filed Weights   11/26/14 1535  Weight: 48.444 kg (106 lb 12.8 oz)    Exam:   General:  Emaciated, no acute distress, fatigued  Cardiovascular: Regular rate and rhythm, S1-S2  Respiratory: Decreased breath sounds throughout, limited inspiratory effort  Abdomen: Soft, nontender, nondistended, PEG tube in place  Musculoskeletal: No edema   Data Reviewed: Basic Metabolic Panel:  Recent Labs Lab 11/21/14 0550 11/22/14 0441 11/25/14 1741 11/26/14 0300 11/26/14 0458  NA 140 139 138 136 139  K 3.8 3.6 3.6 3.6 3.6  CL 109 108 107 107 107  CO2 '26 25 26 25 25  '$ GLUCOSE 128* 114* 95 91 96  BUN 19 25* 26* 21* 21*  CREATININE 0.71 0.66 0.76 0.70 0.76  CALCIUM 8.6* 8.6* 8.8* 8.7* 8.9   Liver Function Tests:  Recent Labs Lab 11/25/14 1741 11/26/14 0300 11/26/14 0458  AST 49* 47* 46*  ALT 67* 64* 65*  ALKPHOS 398* 367* 376*  BILITOT 1.1 1.0 1.3*  PROT 5.8* 5.9* 6.1*  ALBUMIN 2.0* 1.8* 2.0*   No results for input(s): LIPASE, AMYLASE in the last 168 hours. No results for input(s): AMMONIA in the last 168 hours. CBC:  Recent Labs Lab 11/21/14 0550 11/22/14 0441 11/25/14 1741 11/26/14 0300 11/26/14 0458  WBC 14.5* 14.8* 11.5* 11.0* 12.4*  NEUTROABS  --   --  9.5* 9.2*  --   HGB 9.5* 9.5* 9.4* 9.2* 9.3*  HCT 28.7* 29.1* 29.0* 28.3* 29.5*  MCV 95.7 96.0 96.7 96.6 96.4  PLT 233 242 244 229 239   Cardiac Enzymes:   No results for input(s): CKTOTAL, CKMB, CKMBINDEX, TROPONINI in the last  168 hours. BNP (last 3 results) No results for input(s): BNP in the last 8760 hours.  ProBNP (last 3 results) No results for input(s): PROBNP in the last 8760 hours.  CBG:  Recent Labs Lab 11/26/14 0806  GLUCAP 96    Recent Results (from the past 240 hour(s))  Blood Culture (routine x 2)     Status: None   Collection Time: 11/18/14  1:39 PM  Result Value Ref Range Status   Specimen Description BLOOD RIGHT ANTECUBITAL  Final   Special Requests BOTTLES DRAWN  AEROBIC AND ANAEROBIC 5ML  Final   Culture   Final    NO GROWTH 5 DAYS Performed at [facility]    Report Status 11/23/2014 FINAL  Final  Blood Culture (routine x 2)     Status: None   Collection Time: 11/18/14  2:10 PM  Result Value Ref Range Status   Specimen Description BLOOD PORTA CATH  Final   Special Requests BLOOD 5CC  Final   Culture   Final    NO GROWTH 5 DAYS Performed at [facility]    Report Status 11/23/2014 FINAL  Final  Urine culture     Status: None   Collection Time: 11/18/14  3:42 PM  Result Value Ref Range Status   Specimen Description URINE, RANDOM  Final   Special Requests NONE  Final   Culture   Final    4,000 COLONIES/mL INSIGNIFICANT GROWTH Performed at [facility]    Report Status 11/20/2014 FINAL  Final  MRSA PCR Screening     Status: None   Collection Time: 11/18/14  4:11 PM  Result Value Ref Range Status   MRSA by PCR NEGATIVE NEGATIVE Final    Comment:        The GeneXpert MRSA Assay (FDA approved for NASAL specimens only), is one component of a comprehensive MRSA colonization surveillance program. It is not intended to diagnose MRSA infection nor to guide or monitor treatment for MRSA infections.   Culture, sputum-assessment     Status: None   Collection Time: 11/19/14  6:28 PM  Result Value Ref Range Status   Specimen Description Expect. Sput  Final   Special Requests NONE  Final   Sputum evaluation   Final    THIS SPECIMEN IS ACCEPTABLE. RESPIRATORY CULTURE REPORT TO FOLLOW.   Report Status 11/19/2014 FINAL  Final  Culture, respiratory (NON-Expectorated)     Status: None   Collection Time: 11/19/14  7:15 PM  Result Value Ref Range Status   Specimen Description SPUTUM  Final   Special Requests NONE  Final   Gram Stain   Final    ABUNDANT WBC PRESENT,BOTH PMN AND MONONUCLEAR RARE SQUAMOUS EPITHELIAL CELLS PRESENT NO ORGANISMS SEEN Performed at [facility]    Culture   Final    FEW PSEUDOMONAS  AERUGINOSA Performed at [facility]    Report Status 11/22/2014 FINAL  Final   Organism ID, Bacteria PSEUDOMONAS AERUGINOSA  Final      Susceptibility   Pseudomonas aeruginosa - MIC*    CEFEPIME 2 SENSITIVE Sensitive     CEFTAZIDIME 4 SENSITIVE Sensitive     CIPROFLOXACIN <=0.25 SENSITIVE Sensitive     GENTAMICIN <=1 SENSITIVE Sensitive     IMIPENEM 2 SENSITIVE Sensitive     PIP/TAZO 8 SENSITIVE Sensitive     TOBRAMYCIN <=1 SENSITIVE Sensitive     * FEW PSEUDOMONAS AERUGINOSA     Studies: Ct Angio Chest Pe W/cm &/or Wo Cm  11/25/2014   CLINICAL DATA:  Acute onset of shortness of breath, productive cough and confusion. Initial encounter.  EXAM: CT ANGIOGRAPHY CHEST WITH CONTRAST  TECHNIQUE: Multidetector CT imaging of the chest was performed using the standard protocol during bolus administration of intravenous contrast. Multiplanar CT image reconstructions and MIPs were obtained to evaluate the vascular anatomy.  CONTRAST:  18m OMNIPAQUE IOHEXOL 350 MG/ML SOLN  COMPARISON:  Chest radiograph performed earlier today at at 5:44 p.m., and CT of the chest performed 08/06/2014  FINDINGS: There is no evidence of pulmonary embolus.  Dense right lower lobe pneumonia is noted. Pockets of fluid within the right lower lobe raise concern for necrotizing pneumonia. A loculated small right-sided pleural effusion is noted, with peripheral enhancement, likely reflecting underlying empyema.  Scarring and calcification noted at the medial aspect of the right lung, and the left lung apex. Scattered blebs are noted in the periphery of the left lung. There is no evidence of pneumothorax.  A right paratracheal mass has increased in size, measuring 5.1 x 4.3 cm, compatible with recurrent malignancy. Additional enlarged right paratracheal and subcarinal nodes measure up to 1.5 cm in short axis. Trace pericardial fluid remains within normal limits. Diffuse coronary artery calcifications seen. Scattered  calcification is noted along the aortic arch and proximal great vessels. No axillary lymphadenopathy is seen. The thyroid gland is diminutive and unremarkable in appearance. A right-sided chest port is noted ending about the distal SVC.  The visualized portions of the liver and spleen are unremarkable. A common bile duct stent is partially imaged, with underlying pneumobilia.  No acute osseous abnormalities are seen.  Review of the MIP images confirms the above findings.  IMPRESSION: 1. No evidence of pulmonary embolus. 2. Dense right lower lobe pneumonia noted. Pockets of fluid within the right lower lobe raise concern for necrotizing pneumonia. Underlying loculated peripherally enhancing small right pleural effusion seen, likely reflecting associated empyema. 3. Right paratracheal mass has increased in size, measuring 5.1 x 4.3 cm, compatible with recurrent malignancy. Additional enlarged mediastinal nodes likely also reflect nodal metastases. 4. Diffuse coronary artery calcifications seen. 5. Common bile duct stent, with underlying pneumobilia. These results were called by telephone at the time of interpretation on 11/25/2014 at 10:05 pm to Dr. EQuintella Reichert who verbally acknowledged these results.   Electronically Signed   By: JGarald BaldingM.D.   On: 11/25/2014 22:07   Dg Chest Port 1 View  11/26/2014   CLINICAL DATA:  Sepsis  EXAM: PORTABLE CHEST - 1 VIEW  COMPARISON:  11/25/2014  FINDINGS: There is a right jugular Port-A-Cath with tip in the low SVC. There is consolidation in the right base, not significantly changed. The left lung remains clear. Pulmonary vasculature is normal. Right peritracheal mass again noted, unchanged.  IMPRESSION: Unchanged right base consolidation. Unchanged right peritracheal mass.   Electronically Signed   By: DAndreas NewportM.D.   On: 11/26/2014 04:22    Scheduled Meds: . B-complex with vitamin C  1 tablet Oral Daily  . cefTAZidime (FORTAZ)  IV  2 g Intravenous Q8H    . clindamycin (CLEOCIN) IV  600 mg Intravenous 4 times per day  . docusate sodium  100 mg Oral BID  . ferrous sulfate  325 mg Oral TID WC  . folic acid  1 mg Oral Daily  . mirabegron ER  50 mg Oral BID  . multivitamin with minerals  1 tablet Oral Daily  . OLANZapine  10 mg Oral QHS  . pantoprazole  40 mg Oral Daily  . saccharomyces boulardii  250 mg Oral Daily  . sodium chloride  3 mL Intravenous Q12H  . thiamine  100 mg Oral Daily  . vancomycin  750 mg Intravenous Q12H    Continuous Infusions: . sodium chloride 50 mL/hr at 11/26/14 0759  . feeding supplement (OSMOLITE 1.5 CAL) 1,000 mL (11/26/14 0629)     Time spent: 25 minutes  [facility] Pager [phone]. If 7PM-7AM, please contact night-coverage at [email or web address], password [facility] 11/26/2014, 7:22 PM  LOS: 1 day

## 2014-11-27 ENCOUNTER — Inpatient Hospital Stay (HOSPITAL_COMMUNITY): Payer: Medicare Other

## 2014-11-27 ENCOUNTER — Encounter: Payer: Self-pay | Admitting: *Deleted

## 2014-11-27 DIAGNOSIS — C3492 Malignant neoplasm of unspecified part of left bronchus or lung: Secondary | ICD-10-CM

## 2014-11-27 DIAGNOSIS — J189 Pneumonia, unspecified organism: Principal | ICD-10-CM

## 2014-11-27 LAB — LEGIONELLA ANTIGEN, URINE

## 2014-11-27 LAB — LACTATE DEHYDROGENASE: LDH: 113 U/L (ref 98–192)

## 2014-11-27 LAB — PROTEIN, TOTAL: Total Protein: 6 g/dL — ABNORMAL LOW (ref 6.5–8.1)

## 2014-11-27 LAB — BODY FLUID CELL COUNT WITH DIFFERENTIAL
Eos, Fluid: 0 %
LYMPHS FL: 2 %
Monocyte-Macrophage-Serous Fluid: 10 % — ABNORMAL LOW (ref 50–90)
NEUTROPHIL FLUID: 88 % — AB (ref 0–25)

## 2014-11-27 LAB — CHOLESTEROL, TOTAL: Cholesterol: 130 mg/dL (ref 0–200)

## 2014-11-27 LAB — GLUCOSE, CAPILLARY: Glucose-Capillary: 105 mg/dL — ABNORMAL HIGH (ref 65–99)

## 2014-11-27 MED ORDER — OSMOLITE 1.5 CAL PO LIQD
1000.0000 mL | ORAL | Status: DC
  Administered 2014-11-27: 1000 mL
  Filled 2014-11-27 (×3): qty 1000

## 2014-11-27 MED ORDER — PRO-STAT SUGAR FREE PO LIQD
30.0000 mL | Freq: Two times a day (BID) | ORAL | Status: DC
  Administered 2014-11-27 – 2014-12-01 (×8): 30 mL
  Filled 2014-11-27 (×8): qty 30

## 2014-11-27 MED ORDER — ATENOLOL 50 MG PO TABS
50.0000 mg | ORAL_TABLET | Freq: Every day | ORAL | Status: DC
  Administered 2014-11-27 – 2014-12-03 (×7): 50 mg via ORAL
  Filled 2014-11-27 (×7): qty 1

## 2014-11-27 NOTE — Procedures (Signed)
Thoracentesis Procedure Note  Pre-operative Diagnosis: empyema   Post-operative Diagnosis: same  Indications: evaluation of fluid   Procedure Details  Consent: Informed consent was obtained. Risks of the procedure were discussed including: infection, bleeding, pain, pneumothorax.  Under sterile conditions the patient was positioned. Betadine solution and sterile drapes were utilized.  1% buffered lidocaine was used to anesthetize the pleural space which was identified via [place]. Fluid was obtained without any difficulties and minimal blood loss.  A dressing was applied to the wound and wound care instructions were provided.   Findings 150 ml of frank pus pleural fluid was obtained. A sample was sent to Pathology for cytogenetics, flow, and cell counts, as well as for infection analysis.  Complications:  None; patient tolerated the procedure well.          Condition: stable  Plan A follow up chest x-ray was ordered. Bed Rest for 0 hours. Tylenol 650 mg. for pain.

## 2014-11-27 NOTE — Progress Notes (Signed)
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 Recently admitted for pseudomonas PNA. Readmitted 8/28 w/  Lethargy, confusion, increased SOB and  sputum production. PCCM asked to see re  CT scan findings of the chest done for PE protocol suggested a necrotizing pneumonia now seen along with a probable empyema.  SIGNIFICANT EVENTS  Right thora 8/31 150 ml frank pus Pleural fluid 8/31>>> pLDH 8/31>>> pProtein 8/31>>> sLDH 8/31>>> s protein 8/31>>> Cell count 8/31>>> Cytology 8/31>>>  STUDIES:  CT scan 8/29: 1. No evidence of pulmonary embolus.2. Dense right lower lobe pneumonia noted. Pockets of fluid within the right lower lobe raise concern for necrotizing pneumonia.Underlying loculated peripherally enhancing small right pleural effusion seen, likely reflecting associated empyema.3. Right paratracheal mass has increased in size, measuring 5.1 x 4.3 cm, compatible with recurrent malignancy. Additional enlarged mediastinal nodes likely also reflect nodal metastases. 4. Diffuse coronary artery calcifications seen. 5. Common bile duct stent, with underlying pneumobilia..  SUBJECTIVE:  No distress no longer confused  VITAL SIGNS: Temp:  [97.3 F (36.3 C)-98 F (36.7 C)] 98 F (36.7 C) (08/31 0539) Pulse Rate:  [78-84] 78 (08/31 0539) Resp:  [17-20] 18 (08/31 0539) BP: (120-132)/(64-78) 132/64 mmHg (08/31 0539) SpO2:  [95 %-98 %] 98 % (08/31 0539) Weight:  [47.2 kg (104 lb 0.9 oz)-48.444 kg (106 lb 12.8 oz)] 47.2 kg (104 lb 0.9 oz) (08/31 0500)  PHYSICAL EXAMINATION: General:  Frail 69 year old male less toxic appearing today  Neuro:  Awake, oriented X 3, moves all ext  HEENT:   Temporal wasting MMM  Cardiovascular:  rrr Lungs:  Decreased on right, no accessory muscle use  Abdomen:  Soft, non-tender  Musculoskeletal:  Intact  Skin:  Intact    Recent Labs Lab 11/25/14 1741 11/26/14 0300 11/26/14 0458  NA 138 136 139  K 3.6 3.6 3.6  CL 107 107 107  CO2 '26 25 25  '$ BUN 26* 21* 21*  CREATININE 0.76 0.70 0.76  GLUCOSE 95 91 96    Recent Labs Lab 11/25/14 1741 11/26/14 0300 11/26/14 0458  HGB 9.4* 9.2* 9.3*  HCT 29.0* 28.3* 29.5*  WBC 11.5* 11.0* 12.4*  PLT 244 229 239   Dg Chest 2 View  11/25/2014   CLINICAL DATA:  Pneumonia with worsening shortness of Breath. Confusion. History of non-small cell lung cancer.  EXAM: CHEST  2 VIEW  COMPARISON:  11/18/2014  FINDINGS: The cardiac silhouette, mediastinal and hilar contours are stable. Persistent right lower lobe pneumonia. Stable underlying emphysematous changes. The power port is stable.  IMPRESSION: Persistent right lower lobe pneumonia.   Electronically Signed   By: Marijo Sanes M.D.   On: 11/25/2014 18:24   Ct Angio Chest Pe W/cm &/or Wo Cm  11/25/2014   CLINICAL DATA:  Acute onset of shortness of breath, productive cough and confusion. Initial encounter.  EXAM: CT ANGIOGRAPHY CHEST WITH CONTRAST  TECHNIQUE: Multidetector CT imaging of the chest was performed using the standard protocol during bolus administration of intravenous contrast. Multiplanar CT image reconstructions and MIPs were obtained to evaluate the vascular anatomy.  CONTRAST:  60m OMNIPAQUE IOHEXOL 350 MG/ML SOLN  COMPARISON:  Chest radiograph performed earlier  today at at 5:44 p.m., and CT of the chest performed 08/06/2014  FINDINGS: There is no evidence of pulmonary embolus.  Dense right lower lobe pneumonia is noted. Pockets of fluid within the right lower lobe raise concern for necrotizing pneumonia. A loculated small right-sided pleural effusion is noted, with peripheral enhancement, likely reflecting underlying empyema.  Scarring and  calcification noted at the medial aspect of the right lung, and the left lung apex. Scattered blebs are noted in the periphery of the left lung. There is no evidence of pneumothorax.  A right paratracheal mass has increased in size, measuring 5.1 x 4.3 cm, compatible with recurrent malignancy. Additional enlarged right paratracheal and subcarinal nodes measure up to 1.5 cm in short axis. Trace pericardial fluid remains within normal limits. Diffuse coronary artery calcifications seen. Scattered calcification is noted along the aortic arch and proximal great vessels. No axillary lymphadenopathy is seen. The thyroid gland is diminutive and unremarkable in appearance. A right-sided chest port is noted ending about the distal SVC.  The visualized portions of the liver and spleen are unremarkable. A common bile duct stent is partially imaged, with underlying pneumobilia.  No acute osseous abnormalities are seen.  Review of the MIP images confirms the above findings.  IMPRESSION: 1. No evidence of pulmonary embolus. 2. Dense right lower lobe pneumonia noted. Pockets of fluid within the right lower lobe raise concern for necrotizing pneumonia. Underlying loculated peripherally enhancing small right pleural effusion seen, likely reflecting associated empyema. 3. Right paratracheal mass has increased in size, measuring 5.1 x 4.3 cm, compatible with recurrent malignancy. Additional enlarged mediastinal nodes likely also reflect nodal metastases. 4. Diffuse coronary artery calcifications seen. 5. Common bile duct stent, with underlying pneumobilia. These results were called by telephone at the time of interpretation on 11/25/2014 at 10:05 pm to Dr. Quintella Reichert, who verbally acknowledged these results.   Electronically Signed   By: Garald Balding M.D.   On: 11/25/2014 22:07   Dg Chest Port 1 View  11/26/2014   CLINICAL DATA:  Sepsis  EXAM: PORTABLE CHEST - 1 VIEW  COMPARISON:  11/25/2014  FINDINGS: There is a right jugular  Port-A-Cath with tip in the low SVC. There is consolidation in the right base, not significantly changed. The left lung remains clear. Pulmonary vasculature is normal. Right peritracheal mass again noted, unchanged.  IMPRESSION: Unchanged right base consolidation. Unchanged right peritracheal mass.   Electronically Signed   By: Andreas Newport M.D.   On: 11/26/2014 04:22    ASSESSMENT / PLAN:  Necrotizing PNA w/ complicated right empyema NSCL CA: stage IV on immunotherapy and palliative XRT to abd mass.  >has isolated pseudomonas but clinically declined on monotherapy for this. ? Polymicrobial??. We did US guided thoracentesis today. Yielded about 150 ml of frank green blood tinged pus. F/U US w/ minimal residual fluid. As mentioned before he is not a candidate for VATS, doubt that CT guided tube placement at this point would yield any further benefit.  Plan Cont current abx & await culture data Will need prolonged abx course (likely excess of 4 weeks) Will f/u serial CXR-->if re-accumulates then CT guided tube is a consideration.    11/27/2014, 11:58 AM

## 2014-11-27 NOTE — Progress Notes (Signed)
Initial Nutrition Assessment  DOCUMENTATION CODES:   Severe malnutrition in context of chronic illness, Underweight  INTERVENTION:   Advance Osmolite 1.5 @ 20 ml/hr via PEG  to goal rate of 45 ml/hr.   30 ml Prostat BID.    Tube feeding regimen provides 1820 kcal (100% of needs), 97 grams of protein, and 823 ml of H2O.   Recommend free water flushes of 120 ml Q2hrs over 14 hours (daytime).  Provide [employer] Instant Breakfast BID, each provides 220 kcal and 13 g protein.  RD to continue to monitor  NUTRITION DIAGNOSIS:   Malnutrition related to dysphagia, cancer and cancer related treatments, chronic illness as evidenced by percent weight loss, severe depletion of body fat, severe depletion of muscle mass.  GOAL:   Patient will meet greater than or equal to 90% of their needs  MONITOR:   Labs, Weight trends, TF tolerance, Skin, I & O's, Supplement acceptance  REASON FOR ASSESSMENT:   Consult Enteral/tube feeding initiation and management  ASSESSMENT:   69 year old male with past mental history of left-sided bronchogenic lung cancer recently discharged from the hospital on IV antibiotics for pseudomonal sepsis from pneumonia, return back on 8/29 for progressively worsening shortness of breath and found to have signs consistent with necrotizing pneumonia/possible empyema.  Pt followed by [place] [facility], last seen 8/22. Pt was assessed by nutrition during previous admission on 8/23.  Pt continues with PEG feedings of Osmolite 1.5. Currently rate at 20 ml/hr. Advancing to goal today of 45 ml/hr. Per wife, he does not tolerate a rate higher than this. RD to order Prostat to contribute more calories and to reach protein goal. Pt's wife states that ~90% of his nutrition comes from the TF, pt is sometimes able to sip on [employer] Breakfast drinks at home. RD to order.  Per weight history, pt has lost 11 lb since 6/28 (10% weight loss x 2 months, significant for time  frame).  Nutrition-Focused physical exam completed. Findings are severe fat depletion, severe muscle depletion, and no edema.   Labs reviewed: Elevated BUN  Diet Order:  Diet heart healthy/carb modified Room service appropriate?: Yes; Fluid consistency:: Thin  Skin:  Reviewed, no issues  Last BM:  8/29  Height:   Ht Readings from Last 1 Encounters:  11/26/14 '5\' 8"'$  (1.727 m)    Weight:   Wt Readings from Last 1 Encounters:  11/27/14 104 lb 0.9 oz (47.2 kg)    Ideal Body Weight:  70 kg  BMI:  Body mass index is 15.83 kg/(m^2).  Estimated Nutritional Needs:   Kcal:  1800-2000  Protein:  95-105g  Fluid:  1.9L/day  EDUCATION NEEDS:   No education needs identified at this time  Clayton Bibles, MS, RD, LDN Pager: [phone] After Hours Pager: [phone]

## 2014-11-27 NOTE — Care Management Note (Signed)
Case Management Note  Patient Details  Name: [name] MRN: [id number] Date of Birth: February 05, 1946  Subjective/Objective:      69 yo admitted with Sepsis              Action/Plan: From home with spouse and [facility]  Expected Discharge Date:   (unknown)               Expected Discharge Plan:  [place]  In-House Referral:     Discharge planning Services  CM Consult  Post Acute Care Choice:  Resumption of Svcs/PTA Provider Choice offered to:     DME Arranged:    DME Agency:     HH Arranged:    [place] Agency:  [place]  Status of Service:  In process, will continue to follow  Medicare Important Message Given:    Date Medicare IM Given:    Medicare IM give by:    Date Additional Medicare IM Given:    Additional Medicare Important Message give by:     If discussed at [place] of Stay Meetings, dates discussed:    Additional Comments: Pt currently active with [facility] and was receiving IV abx and PT at home prior to admission. Will need resumption orders at DC for continued [facility] services. CM will continue to follow and assist with DC as needed. Lynnell Catalan, RN 11/27/2014, 3:03 PM

## 2014-11-27 NOTE — Progress Notes (Signed)
PROGRESS NOTE  [name] LKG:[id number] DOB: 1946/03/03 DOA: 11/25/2014 PCP: [name], MD  HPI/Recap of past 93 hours: 69 year old male with past mental history of left-sided bronchogenic lung cancer recently discharged from the hospital on IV antibiotics for pseudomonal sepsis from pneumonia, return back on 8/29 for progressively worsening shortness of breath and found to have signs consistent with necrotizing pneumonia/possible empyema. Patient admitted to hospitalist service, started on IV antibiotics., Pulmonary consulted  Sepsis able to be ruled out. Despite necrotizing pneumonia, patient not hypoxic, breathing on room air.  Seen by pulmonary who planned for thoracentesis on 8/31  Assessment/Plan: Bronchogenic cancer of left lung: Mass seen on CT larger. We'll consult palliative care  Hypertension:  Resume atenolol.   HCAP (healthcare-associated pneumonia): See below, continue antibiotics Necrotizing pneumonia/loculated pleural effusion: Ultrasound guided thoracentesis planned for today. by pulmonary and if needed, chest tube.  Continue with IV antibiotics. Cefta, vancomycin, clindamycin.  Follow pleural fluid culture.   Protein-calorie malnutrition, severe: Receiving nutrition through PEG tube. Nutritionist consulted.    Code Status: DO NOT RESUSCITATE  Family Communication: Wife present  Disposition Plan: Anticipate several days treating pleural effusion, hopefully not empyema. Palliative care for goals of therapy   Consultants:  Pulmonary  Palliative care  Procedures:  Plan thoracentesis 8/31  Antibiotics:  IV Tressie Ellis 8/24-present (started since previous admission)  IV vancomycin 8/29-present   Objective: BP 132/64 mmHg  Pulse 78  Temp(Src) 98 F (36.7 C) (Oral)  Resp 18  Ht '5\' 8"'$  (1.727 m)  Wt 47.2 kg (104 lb 0.9 oz)  BMI 15.83 kg/m2  SpO2 98%  Intake/Output Summary (Last 24 hours) at 11/27/14 1549 Last data filed at 11/27/14 1500  Gross per 24 hour  Intake 3074.5 ml  Output   1250 ml  Net 1824.5 ml   Filed Weights   11/26/14 1535 11/27/14 0500  Weight: 48.444 kg (106 lb 12.8 oz) 47.2 kg (104 lb 0.9 oz)    Exam:   General:  Emaciated, no acute distress, fatigued  Cardiovascular: Regular rate and rhythm, S1-S2  Respiratory: Decreased breath sounds throughout, limited inspiratory effort  Abdomen: Soft, nontender, nondistended, PEG tube in place  Musculoskeletal: No edema   Data Reviewed: Basic Metabolic Panel:  Recent Labs Lab 11/21/14 0550 11/22/14 0441 11/25/14 1741 11/26/14 0300 11/26/14 0458  NA 140 139 138 136 139  K 3.8 3.6 3.6 3.6 3.6  CL 109 108 107 107 107  CO2 '26 25 26 25 25  '$ GLUCOSE 128* 114* 95 91 96  BUN 19 25* 26* 21* 21*  CREATININE 0.71 0.66 0.76 0.70 0.76  CALCIUM 8.6* 8.6* 8.8* 8.7* 8.9   Liver Function Tests:  Recent Labs Lab 11/25/14 1741 11/26/14 0300 11/26/14 0458  AST 49* 47* 46*  ALT 67* 64* 65*  ALKPHOS 398* 367* 376*  BILITOT 1.1 1.0 1.3*  PROT 5.8* 5.9* 6.1*  ALBUMIN 2.0* 1.8* 2.0*   No results for input(s): LIPASE, AMYLASE in the last 168 hours. No results for input(s): AMMONIA in the last 168 hours. CBC:  Recent Labs Lab 11/21/14 0550 11/22/14 0441 11/25/14 1741 11/26/14 0300 11/26/14 0458  WBC 14.5* 14.8* 11.5* 11.0* 12.4*  NEUTROABS  --   --  9.5* 9.2*  --   HGB 9.5* 9.5* 9.4* 9.2* 9.3*  HCT 28.7* 29.1* 29.0* 28.3* 29.5*  MCV 95.7 96.0 96.7 96.6 96.4  PLT 233 242 244 229 239   Cardiac Enzymes:   No results for input(s): CKTOTAL, CKMB, CKMBINDEX, TROPONINI in the last 168  hours. BNP (last 3 results) No results for input(s): BNP in the last 8760 hours.  ProBNP (last 3 results) No results for input(s): PROBNP in the last 8760 hours.  CBG:  Recent Labs Lab 11/26/14 0806 11/27/14 0741  GLUCAP 96 105*    Recent Results (from the past 240 hour(s))  Blood Culture (routine x 2)     Status: None   Collection Time: 11/18/14  1:39 PM    Result Value Ref Range Status   Specimen Description BLOOD RIGHT ANTECUBITAL  Final   Special Requests BOTTLES DRAWN AEROBIC AND ANAEROBIC 5ML  Final   Culture   Final    NO GROWTH 5 DAYS Performed at [facility]    Report Status 11/23/2014 FINAL  Final  Blood Culture (routine x 2)     Status: None   Collection Time: 11/18/14  2:10 PM  Result Value Ref Range Status   Specimen Description BLOOD PORTA CATH  Final   Special Requests BLOOD 5CC  Final   Culture   Final    NO GROWTH 5 DAYS Performed at [facility]    Report Status 11/23/2014 FINAL  Final  Urine culture     Status: None   Collection Time: 11/18/14  3:42 PM  Result Value Ref Range Status   Specimen Description URINE, RANDOM  Final   Special Requests NONE  Final   Culture   Final    4,000 COLONIES/mL INSIGNIFICANT GROWTH Performed at [facility]    Report Status 11/20/2014 FINAL  Final  MRSA PCR Screening     Status: None   Collection Time: 11/18/14  4:11 PM  Result Value Ref Range Status   MRSA by PCR NEGATIVE NEGATIVE Final    Comment:        The GeneXpert MRSA Assay (FDA approved for NASAL specimens only), is one component of a comprehensive MRSA colonization surveillance program. It is not intended to diagnose MRSA infection nor to guide or monitor treatment for MRSA infections.   Culture, sputum-assessment     Status: None   Collection Time: 11/19/14  6:28 PM  Result Value Ref Range Status   Specimen Description Expect. Sput  Final   Special Requests NONE  Final   Sputum evaluation   Final    THIS SPECIMEN IS ACCEPTABLE. RESPIRATORY CULTURE REPORT TO FOLLOW.   Report Status 11/19/2014 FINAL  Final  Culture, respiratory (NON-Expectorated)     Status: None   Collection Time: 11/19/14  7:15 PM  Result Value Ref Range Status   Specimen Description SPUTUM  Final   Special Requests NONE  Final   Gram Stain   Final    ABUNDANT WBC PRESENT,BOTH PMN AND MONONUCLEAR RARE SQUAMOUS  EPITHELIAL CELLS PRESENT NO ORGANISMS SEEN Performed at [facility]    Culture   Final    FEW PSEUDOMONAS AERUGINOSA Performed at [facility]    Report Status 11/22/2014 FINAL  Final   Organism ID, Bacteria PSEUDOMONAS AERUGINOSA  Final      Susceptibility   Pseudomonas aeruginosa - MIC*    CEFEPIME 2 SENSITIVE Sensitive     CEFTAZIDIME 4 SENSITIVE Sensitive     CIPROFLOXACIN <=0.25 SENSITIVE Sensitive     GENTAMICIN <=1 SENSITIVE Sensitive     IMIPENEM 2 SENSITIVE Sensitive     PIP/TAZO 8 SENSITIVE Sensitive     TOBRAMYCIN <=1 SENSITIVE Sensitive     * FEW PSEUDOMONAS AERUGINOSA  Culture, blood (routine x 2)  Status: None (Preliminary result)   Collection Time: 11/25/14 10:18 PM  Result Value Ref Range Status   Specimen Description BLOOD RIGHT ANTECUBITAL  Final   Special Requests BOTTLES DRAWN AEROBIC AND ANAEROBIC 5CC  Final   Culture   Final    NO GROWTH 1 DAY Performed at [facility]    Report Status PENDING  Incomplete  Culture, blood (routine x 2)     Status: None (Preliminary result)   Collection Time: 11/25/14 10:20 PM  Result Value Ref Range Status   Specimen Description BLOOD LEFT ANTECUBITAL  Final   Special Requests BOTTLES DRAWN AEROBIC AND ANAEROBIC 5CC  Final   Culture   Final    NO GROWTH 1 DAY Performed at [facility]    Report Status PENDING  Incomplete  Body fluid culture     Status: None (Preliminary result)   Collection Time: 11/27/14 11:55 AM  Result Value Ref Range Status   Specimen Description FLUID RIGHT PLEURAL  Final   Special Requests Immunocompromised  Final   Gram Stain   Final    ABUNDANT WBC PRESENT, PREDOMINANTLY PMN RARE GRAM NEGATIVE COCCOBACILLI Performed at [facility]    Culture PENDING  Incomplete   Report Status PENDING  Incomplete     Studies: Dg Chest Port 1 View  11/27/2014   CLINICAL DATA:  Status post right-sided thoracentesis.  EXAM: PORTABLE CHEST - 1 VIEW  COMPARISON:   11/26/2014  FINDINGS: The cardiac silhouette, mediastinal and hilar contours are stable. Persistent right lower lobe airspace opacity. No obvious pleural effusion. No postprocedural pneumothorax.  IMPRESSION: Stable chest x-ray findings. No postprocedural pneumothorax is identified.   Electronically Signed   By: Marijo Sanes M.D.   On: 11/27/2014 12:45    Scheduled Meds: . B-complex with vitamin C  1 tablet Oral Daily  . cefTAZidime (FORTAZ)  IV  2 g Intravenous Q8H  . clindamycin (CLEOCIN) IV  600 mg Intravenous 4 times per day  . docusate sodium  100 mg Oral BID  . feeding supplement (PRO-STAT SUGAR FREE 64)  30 mL Per Tube BID  . ferrous sulfate  325 mg Oral TID WC  . folic acid  1 mg Oral Daily  . mirabegron ER  50 mg Oral BID  . multivitamin with minerals  1 tablet Oral Daily  . OLANZapine  10 mg Oral QHS  . pantoprazole  40 mg Oral Daily  . saccharomyces boulardii  250 mg Oral Daily  . sodium chloride  3 mL Intravenous Q12H  . thiamine  100 mg Oral Daily  . vancomycin  750 mg Intravenous Q12H    Continuous Infusions: . sodium chloride 50 mL/hr at 11/26/14 0759  . feeding supplement (OSMOLITE 1.5 CAL)       Time spent: 25 minutes  Paula Zietz A  [facility] Pager [phone]. If 7PM-7AM, please contact night-coverage at [email or web address], password [facility] 11/27/2014, 3:49 PM  LOS: 2 days

## 2014-11-27 NOTE — Consult Note (Signed)
   [facility] CM Inpatient Consult   11/27/2014  [name] [birth year] [id number]   Patient evaluated for [facility] services. Spoke with him at bedside to discuss and explain [facility]. Consents obtained. Explained to Mr. Hirsch that he will receive post transition of care calls and will be evaluated for monthly home visits. He endorses he is already active with [facility]. Made him aware that [facility] will not interfere or replace services provided by home health. Confirmed best contact number as [phone]. [facility] Care Management packet left at bedside. Made inpatient RNCM aware of visit. Will continue to follow along hospital course prior to request of assigning [facility] based on disposition plans. Noted palliative has been mentioned in chart. Will continue to closely follow.   Marthenia Rolling, MSN-Ed, RN,BSN [facility] Liaison [phone]

## 2014-11-28 DIAGNOSIS — E43 Unspecified severe protein-calorie malnutrition: Secondary | ICD-10-CM

## 2014-11-28 LAB — BASIC METABOLIC PANEL
ANION GAP: 8 (ref 5–15)
BUN: 21 mg/dL — AB (ref 6–20)
CHLORIDE: 104 mmol/L (ref 101–111)
CO2: 29 mmol/L (ref 22–32)
Calcium: 8.8 mg/dL — ABNORMAL LOW (ref 8.9–10.3)
Creatinine, Ser: 0.86 mg/dL (ref 0.61–1.24)
GFR calc Af Amer: >60 mL/min (ref >60)
GFR calc non Af Amer: >60 mL/min (ref >60)
GLUCOSE: 126 mg/dL — AB (ref 65–99)
POTASSIUM: 3 mmol/L — AB (ref 3.5–5.1)
SODIUM: 141 mmol/L (ref 135–145)

## 2014-11-28 LAB — CBC
HCT: 30.5 % — ABNORMAL LOW (ref 39.0–52.0)
HEMOGLOBIN: 9.9 g/dL — AB (ref 13.0–17.0)
MCH: 31.2 pg (ref 26.0–34.0)
MCHC: 32.5 g/dL (ref 30.0–36.0)
MCV: 96.2 fL (ref 78.0–100.0)
Platelets: 321 10*3/uL (ref 150–400)
RBC: 3.17 MIL/uL — AB (ref 4.22–5.81)
RDW: 15.6 % — ABNORMAL HIGH (ref 11.5–15.5)
WBC: 11.3 10*3/uL — ABNORMAL HIGH (ref 4.0–10.5)

## 2014-11-28 LAB — VANCOMYCIN, TROUGH: VANCOMYCIN TR: 24 ug/mL — AB (ref 10.0–20.0)

## 2014-11-28 LAB — GLUCOSE, CAPILLARY: GLUCOSE-CAPILLARY: 126 mg/dL — AB (ref 65–99)

## 2014-11-28 MED ORDER — POTASSIUM CHLORIDE 20 MEQ/15ML (10%) PO SOLN
40.0000 meq | Freq: Once | ORAL | Status: AC
  Administered 2014-11-28: 40 meq
  Filled 2014-11-28: qty 30

## 2014-11-28 MED ORDER — VANCOMYCIN HCL 500 MG IV SOLR
500.0000 mg | Freq: Two times a day (BID) | INTRAVENOUS | Status: DC
  Administered 2014-11-29: 500 mg via INTRAVENOUS
  Filled 2014-11-28 (×2): qty 500

## 2014-11-28 MED ORDER — OSMOLITE 1.5 CAL PO LIQD
1000.0000 mL | ORAL | Status: DC
  Administered 2014-11-28 – 2014-11-30 (×3): 1000 mL
  Filled 2014-11-28 (×5): qty 1000

## 2014-11-28 NOTE — Progress Notes (Signed)
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 Recently admitted for pseudomonas PNA. Readmitted 8/28 w/  Lethargy, confusion, increased SOB and  sputum production. PCCM asked to see re  CT scan findings of the chest done for PE protocol suggested a necrotizing pneumonia now seen along with a probable empyema.  SIGNIFICANT EVENTS  Right thora 8/31 150 ml frank pus Pleural fluid cx 8/31>>>   STUDIES:  CT scan 8/29: 1. No evidence of pulmonary embolus.2. Dense right lower lobe pneumonia noted. Pockets of fluid within the right lower lobe raise concern for necrotizing pneumonia.Underlying loculated peripherally enhancing small right pleural effusion seen, likely reflecting associated empyema.3. Right paratracheal mass has increased in size, measuring 5.1 x 4.3 cm, compatible with recurrent malignancy. Additional enlarged mediastinal nodes likely also reflect nodal metastases. 4. Diffuse coronary artery calcifications seen. 5. Common bile duct stent, with underlying pneumobilia..  SUBJECTIVE:  afebrile No pain/ distress no longer confused   VITAL SIGNS: Temp:  [97.5 F (36.4 C)-98.2 F (36.8 C)] 97.9 F (36.6 C) (09/01 0414) Pulse Rate:  [76-83] 83 (09/01 0414) Resp:  [18-20] 18 (09/01 0414) BP: (106-137)/(55-72) 117/72 mmHg (09/01 0414) SpO2:  [96 %-100 %] 96 % (09/01 0414) Weight:  [105 lb 3.2 oz (47.718 kg)] 105 lb 3.2 oz (47.718 kg) (09/01 0414)  PHYSICAL EXAMINATION: General:  Frail , chr ill appearing Neuro:  Awake, oriented X 3, moves all ext  HEENT:  Temporal wasting MMM  Cardiovascular:  rrr Lungs:  Decreased on right, no accessory muscle use  Abdomen:  Soft,  non-tender  Musculoskeletal:  Intact  Skin:  Intact    Recent Labs Lab 11/26/14 0300 11/26/14 0458 11/28/14 0410  NA 136 139 141  K 3.6 3.6 3.0*  CL 107 107 104  CO2 '25 25 29  '$ BUN 21* 21* 21*  CREATININE 0.70 0.76 0.86  GLUCOSE 91 96 126*    Recent Labs Lab 11/26/14 0300 11/26/14 0458 11/28/14 0410  HGB 9.2* 9.3* 9.9*  HCT 28.3* 29.5* 30.5*  WBC 11.0* 12.4* 11.3*  PLT 229 239 321   Dg Chest Port 1 View  11/27/2014   CLINICAL DATA:  Status post right-sided thoracentesis.  EXAM: PORTABLE CHEST - 1 VIEW  COMPARISON:  11/26/2014  FINDINGS: The cardiac silhouette, mediastinal and hilar contours are stable. Persistent right lower lobe airspace opacity. No obvious pleural effusion. No postprocedural pneumothorax.  IMPRESSION: Stable chest x-ray findings. No postprocedural pneumothorax is identified.   Electronically Signed   By: Marijo Sanes M.D.   On: 11/27/2014 12:45    ASSESSMENT / PLAN:  Necrotizing PNA w/ complicated right empyema - pseudomonas NSCLCA: stage IV on immunotherapy and palliative XRT to abd mass.  8/31 US guided thoracentesis - Yielded about 150 ml of frank green blood tinged pus. F/U US w/ minimal residual fluid. As mentioned before he is not a candidate for VATS, doubt that CT guided tube placement at this point would yield any further benefit.   Plan Cont current abx & await culture data Will need prolonged abx course (likely excess of 4 weeks) Will f/u serial CXR-->if re-accumulates then CT guided tube is a consideration.   Myishia Kasik V.  MD [phone] 11/28/2014, 11:50 AM

## 2014-11-28 NOTE — Progress Notes (Signed)
PROGRESS NOTE  [name] WFU:[id number] DOB: 03-04-1946 DOA: 11/25/2014 PCP: [name], MD  HPI/Recap of past 36 hours: 69 year old male with past mental history of left-sided bronchogenic lung cancer recently discharged from the hospital on IV antibiotics for pseudomonal sepsis from pneumonia, return back on 8/29 for progressively worsening shortness of breath and found to have signs consistent with necrotizing pneumonia/possible empyema. Patient admitted to hospitalist service, started on IV antibiotics., Pulmonary consulted  Sepsis able to be ruled out. Despite necrotizing pneumonia, patient not hypoxic, breathing on room air.  Patient S/P thoracentesis fluid consistent with empyema.    Assessment/Plan: Bronchogenic cancer of left lung: Mass seen on CT larger. consult palliative care Needs to follow up with primary oncologist.   Hypertension:  Resume atenolol.   HCAP (healthcare-associated pneumonia): See below, continue antibiotics Necrotizing pneumonia/loculated pleural effusion: Ultrasound guided thoracentesis 8-31.  Continue with IV antibiotics. Cefta, vancomycin, clindamycin.  Follow pleural fluid culture. Rare gram negative coccobacilli Plan to repeat Chest x ray 9-02.  WBC trending down.   Protein-calorie malnutrition, severe: Receiving nutrition through PEG tube. Nutritionist consulted, tube feeding adjusted.   Hypokalemia; replaced.    Code Status: DO NOT RESUSCITATE  Family Communication: Wife present  Disposition Plan: Anticipate several days treating pleural effusion, hopefully not empyema. Palliative care for goals of therapy   Consultants:  Pulmonary  Palliative care  Procedures:  Plan thoracentesis 8/31  Antibiotics:  IV Tressie Ellis 8/24-present (started since previous admission)  IV vancomycin 8/29-present   Objective: BP 140/73 mmHg  Pulse 74  Temp(Src) 97.7 F (36.5 C) (Oral)  Resp 18  Ht '5\' 8"'$  (1.727 m)  Wt 47.718 kg (105 lb  3.2 oz)  BMI 16.00 kg/m2  SpO2 98%  Intake/Output Summary (Last 24 hours) at 11/28/14 1453 Last data filed at 11/28/14 1322  Gross per 24 hour  Intake   1470 ml  Output   1450 ml  Net     20 ml   Filed Weights   11/26/14 1535 11/27/14 0500 11/28/14 0414  Weight: 48.444 kg (106 lb 12.8 oz) 47.2 kg (104 lb 0.9 oz) 47.718 kg (105 lb 3.2 oz)    Exam:   General:  Emaciated, no acute distress.   Cardiovascular: Regular rate and rhythm, S1-S2  Respiratory: Decreased breath sounds throughout, limited inspiratory effort  Abdomen: Soft, nontender, nondistended, PEG tube in place  Musculoskeletal: No edema   Data Reviewed: Basic Metabolic Panel:  Recent Labs Lab 11/22/14 0441 11/25/14 1741 11/26/14 0300 11/26/14 0458 11/28/14 0410  NA 139 138 136 139 141  K 3.6 3.6 3.6 3.6 3.0*  CL 108 107 107 107 104  CO2 '25 26 25 25 29  '$ GLUCOSE 114* 95 91 96 126*  BUN 25* 26* 21* 21* 21*  CREATININE 0.66 0.76 0.70 0.76 0.86  CALCIUM 8.6* 8.8* 8.7* 8.9 8.8*   Liver Function Tests:  Recent Labs Lab 11/25/14 1741 11/26/14 0300 11/26/14 0458 11/27/14 1705  AST 49* 47* 46*  --   ALT 67* 64* 65*  --   ALKPHOS 398* 367* 376*  --   BILITOT 1.1 1.0 1.3*  --   PROT 5.8* 5.9* 6.1* 6.0*  ALBUMIN 2.0* 1.8* 2.0*  --    No results for input(s): LIPASE, AMYLASE in the last 168 hours. No results for input(s): AMMONIA in the last 168 hours. CBC:  Recent Labs Lab 11/22/14 0441 11/25/14 1741 11/26/14 0300 11/26/14 0458 11/28/14 0410  WBC 14.8* 11.5* 11.0* 12.4* 11.3*  NEUTROABS  --  9.5*  9.2*  --   --   HGB 9.5* 9.4* 9.2* 9.3* 9.9*  HCT 29.1* 29.0* 28.3* 29.5* 30.5*  MCV 96.0 96.7 96.6 96.4 96.2  PLT 242 244 229 239 321   Cardiac Enzymes:   No results for input(s): CKTOTAL, CKMB, CKMBINDEX, TROPONINI in the last 168 hours. BNP (last 3 results) No results for input(s): BNP in the last 8760 hours.  ProBNP (last 3 results) No results for input(s): PROBNP in the last 8760  hours.  CBG:  Recent Labs Lab 11/26/14 0806 11/27/14 0741 11/28/14 0738  GLUCAP 96 105* 126*    Recent Results (from the past 240 hour(s))  Urine culture     Status: None   Collection Time: 11/18/14  3:42 PM  Result Value Ref Range Status   Specimen Description URINE, RANDOM  Final   Special Requests NONE  Final   Culture   Final    4,000 COLONIES/mL INSIGNIFICANT GROWTH Performed at [facility]    Report Status 11/20/2014 FINAL  Final  MRSA PCR Screening     Status: None   Collection Time: 11/18/14  4:11 PM  Result Value Ref Range Status   MRSA by PCR NEGATIVE NEGATIVE Final    Comment:        The GeneXpert MRSA Assay (FDA approved for NASAL specimens only), is one component of a comprehensive MRSA colonization surveillance program. It is not intended to diagnose MRSA infection nor to guide or monitor treatment for MRSA infections.   Culture, sputum-assessment     Status: None   Collection Time: 11/19/14  6:28 PM  Result Value Ref Range Status   Specimen Description Expect. Sput  Final   Special Requests NONE  Final   Sputum evaluation   Final    THIS SPECIMEN IS ACCEPTABLE. RESPIRATORY CULTURE REPORT TO FOLLOW.   Report Status 11/19/2014 FINAL  Final  Culture, respiratory (NON-Expectorated)     Status: None   Collection Time: 11/19/14  7:15 PM  Result Value Ref Range Status   Specimen Description SPUTUM  Final   Special Requests NONE  Final   Gram Stain   Final    ABUNDANT WBC PRESENT,BOTH PMN AND MONONUCLEAR RARE SQUAMOUS EPITHELIAL CELLS PRESENT NO ORGANISMS SEEN Performed at [facility]    Culture   Final    FEW PSEUDOMONAS AERUGINOSA Performed at [facility]    Report Status 11/22/2014 FINAL  Final   Organism ID, Bacteria PSEUDOMONAS AERUGINOSA  Final      Susceptibility   Pseudomonas aeruginosa - MIC*    CEFEPIME 2 SENSITIVE Sensitive     CEFTAZIDIME 4 SENSITIVE Sensitive     CIPROFLOXACIN <=0.25 SENSITIVE Sensitive      GENTAMICIN <=1 SENSITIVE Sensitive     IMIPENEM 2 SENSITIVE Sensitive     PIP/TAZO 8 SENSITIVE Sensitive     TOBRAMYCIN <=1 SENSITIVE Sensitive     * FEW PSEUDOMONAS AERUGINOSA  Culture, blood (routine x 2)     Status: None (Preliminary result)   Collection Time: 11/25/14 10:18 PM  Result Value Ref Range Status   Specimen Description BLOOD RIGHT ANTECUBITAL  Final   Special Requests BOTTLES DRAWN AEROBIC AND ANAEROBIC 5CC  Final   Culture   Final    NO GROWTH 2 DAYS Performed at [facility]    Report Status PENDING  Incomplete  Culture, blood (routine x 2)     Status: None (Preliminary result)   Collection Time: 11/25/14 10:20 PM  Result Value Ref  Range Status   Specimen Description BLOOD LEFT ANTECUBITAL  Final   Special Requests BOTTLES DRAWN AEROBIC AND ANAEROBIC 5CC  Final   Culture   Final    NO GROWTH 2 DAYS Performed at [facility]    Report Status PENDING  Incomplete  AFB culture with smear     Status: None (Preliminary result)   Collection Time: 11/27/14 11:55 AM  Result Value Ref Range Status   Specimen Description PLEURAL RIGHT  Final   Special Requests Immunocompromised  Final   Acid Fast Smear   Final    NO ACID FAST BACILLI SEEN Performed at [facility]    Culture   Final    CULTURE WILL BE EXAMINED FOR 6 WEEKS BEFORE ISSUING A FINAL REPORT Performed at [facility]    Report Status PENDING  Incomplete  Body fluid culture     Status: None (Preliminary result)   Collection Time: 11/27/14 11:55 AM  Result Value Ref Range Status   Specimen Description FLUID RIGHT PLEURAL  Final   Special Requests Immunocompromised  Final   Gram Stain   Final    ABUNDANT WBC PRESENT, PREDOMINANTLY PMN RARE GRAM NEGATIVE COCCOBACILLI    Culture   Final    NO GROWTH < 24 HOURS Performed at [facility]    Report Status PENDING  Incomplete     Studies: No results found.  Scheduled Meds: . atenolol  50 mg Oral Daily  .  B-complex with vitamin C  1 tablet Oral Daily  . cefTAZidime (FORTAZ)  IV  2 g Intravenous Q8H  . clindamycin (CLEOCIN) IV  600 mg Intravenous 4 times per day  . docusate sodium  100 mg Oral BID  . feeding supplement (PRO-STAT SUGAR FREE 64)  30 mL Per Tube BID  . ferrous sulfate  325 mg Oral TID WC  . folic acid  1 mg Oral Daily  . mirabegron ER  50 mg Oral BID  . multivitamin with minerals  1 tablet Oral Daily  . OLANZapine  10 mg Oral QHS  . pantoprazole  40 mg Oral Daily  . saccharomyces boulardii  250 mg Oral Daily  . sodium chloride  3 mL Intravenous Q12H  . thiamine  100 mg Oral Daily  . [START ON 11/29/2014] vancomycin  500 mg Intravenous Q12H    Continuous Infusions: . sodium chloride 50 mL/hr at 11/27/14 2117  . feeding supplement (OSMOLITE 1.5 CAL) 1,000 mL (11/27/14 1639)     Time spent: 25 minutes  Jlee Harkless, [facility] Pager [phone]. If 7PM-7AM, please contact night-coverage at [email or web address], password [facility] 11/28/2014, 2:53 PM  LOS: 3 days

## 2014-11-28 NOTE — Progress Notes (Signed)
ANTIBIOTIC CONSULT NOTE - FOLLOW UP  Pharmacy Consult for Vancomycin, Ceftazidime, Clindamycin Indication: pneumonia  Allergies  Allergen Reactions  . Augmentin [Amoxicillin-Pot Clavulanate] Diarrhea  . Carboplatin     Rash and itching after carbo test dose  . Levaquin [Levofloxacin] Other (See Comments)    Knee pain  . Percocet [Oxycodone-Acetaminophen] Nausea And Vomiting  . Zolpidem Tartrate Diarrhea    Patient Measurements: Height: '5\' 8"'$  (172.7 cm) Weight: 105 lb 3.2 oz (47.718 kg) IBW/kg (Calculated) : 68.4  Vital Signs: Temp: 97.9 F (36.6 C) (09/01 0414) Temp Source: Oral (09/01 0414) BP: 117/72 mmHg (09/01 0414) Pulse Rate: 83 (09/01 0414) Intake/Output from previous day: 08/31 0701 - 09/01 0700 In: 1000 [P.O.:550; IV Piggyback:250] Out: 1425 [Urine:1425]  Labs:  Recent Labs  11/26/14 0300 11/26/14 0458 11/28/14 0410  WBC 11.0* 12.4* 11.3*  HGB 9.2* 9.3* 9.9*  PLT 229 239 321  CREATININE 0.70 0.76 0.86   Estimated Creatinine Clearance: 54.7 mL/min (by C-G formula based on Cr of 0.86).  Recent Labs  11/28/14 1130  VANCOTROUGH 24*     Assessment: 15 yoM admitted on 8/29 with hx non-small cell Lung Ca d/c'd from hospital 8/26 with PNA. Now with increased SOB with mobility and cough with green sputum. CXR post thoracentesis shows persistent RLL opacity. Thick purulent pleural fluid c/w empyema - not a surgical or chest tube candidate and will need long-term abx.  Pharmacy is consulted to dose vancomycin, ceftazidime and Clindamycin for possible nectrotizing PNA.  Anti-infectives: 8/29 >>Cefepime >> x1 ED 8/29 >>vancomycin >>  8/30 >> ceftazidime >>  8/30 >>Clindamycin >>  Micro: 8/29 blood: ngtd 8/29 Urine Strep Ag: negative 8/29 Urine Legionella Ag: negative 8/30 HIV: NR 8/31 Pleural fluid: pending  Today, 11/28/2014:  Afebrile  WBC slightly elevated but improving, 11.3  SCr 0.86 with CrCl ~ 55 ml/min  Vancomycin trough level 24,  above goal level   Goal of Therapy:  Vancomycin trough level 15-20 mcg/ml  Appropriate abx dosing, eradication of infection.   Plan:   Continue clindamycin '600mg'$  IV q6h  Continue ceftazidime 2g IV q8h  Decrease to Vancomycin 500 mg IV q12h.  Measure Vanc trough at steady state.  Follow up renal fxn, culture results, and clinical course.   Gretta Arab PharmD, BCPS Pager [phone] 11/28/2014 1:52 PM

## 2014-11-28 NOTE — Care Management Important Message (Signed)
Important Message  Patient Details  Name: [name] MRN: [id number] Date of Birth: September 30, 1945   Medicare Important Message Given:  [facility] notification given    Camillo Flaming 11/28/2014, 12:57 [place] Message  Patient Details  Name: [name] MRN: [id number] Date of Birth: 05/19/1945   Medicare Important Message Given:  Yes-second notification given    Camillo Flaming 11/28/2014, 12:56 PM

## 2014-11-28 NOTE — Progress Notes (Addendum)
Nutrition Follow-up  DOCUMENTATION CODES:   Severe malnutrition in context of chronic illness, Underweight  INTERVENTION:   Recommend liberalizing diet as patient does not consume a lot of PO and feels limited in choices.  Continue Osmolite 1.5 @ 45 ml/hr via PEG. 30 ml Prostat BID.   Tube feeding regimen provides 1820 kcal (100% of needs), 97 grams of protein, and 823 ml of H2O.   Recommend free water flushes of 120 ml Q2hrs over 14 hours (daytime).  Provide Carnation Instant Breakfast BID, each provides 220 kcal and 13 g protein.  RD to continue to monitor  NUTRITION DIAGNOSIS:   Malnutrition related to dysphagia, cancer and cancer related treatments, chronic illness as evidenced by percent weight loss, severe depletion of body fat, severe depletion of muscle mass.  Ongoing.  GOAL:   Patient will meet greater than or equal to 90% of their needs  Meeting.  MONITOR:   Labs, Weight trends, TF tolerance, Skin, I & O's, Supplement acceptance  ASSESSMENT:   69 year old male with past mental history of left-sided bronchogenic lung cancer recently discharged from the hospital on IV antibiotics for pseudomonal sepsis from pneumonia, return back on 8/29 for progressively worsening shortness of breath and found to have signs consistent with necrotizing pneumonia/possible empyema.  Pt's TF at goal at 45 ml/hr. Pt tolerating well. Pt has not been receiving [employer] drinks. RD ordered one today. RD to look into why supplements aren't being provided from kitchen.  Pt has been ordering meals but states he can only eat a few bites before getting full. States that at this rate of TF he is not hungry.   Labs reviewed: CBGs: 105-126 Low K Elevated BUN  Diet Order:  Diet heart healthy/carb modified Room service appropriate?: Yes; Fluid consistency:: Thin  Skin:  Reviewed, no issues  Last BM:  8/29  Height:   Ht Readings from Last 1 Encounters:  11/26/14 5'  8" (1.727 m)    Weight:   Wt Readings from Last 1 Encounters:  11/28/14 105 lb 3.2 oz (47.718 kg)    Ideal Body Weight:  70 kg  BMI:  Body mass index is 16 kg/(m^2).  Estimated Nutritional Needs:   Kcal:  1800-2000  Protein:  95-105g  Fluid:  1.9L/day  EDUCATION NEEDS:   No education needs identified at this time  Clayton Bibles, MS, RD, LDN Pager: [phone] After Hours Pager: [phone]

## 2014-11-29 ENCOUNTER — Inpatient Hospital Stay (HOSPITAL_COMMUNITY): Payer: Medicare Other

## 2014-11-29 DIAGNOSIS — A419 Sepsis, unspecified organism: Secondary | ICD-10-CM

## 2014-11-29 DIAGNOSIS — J869 Pyothorax without fistula: Secondary | ICD-10-CM | POA: Insufficient documentation

## 2014-11-29 LAB — CBC
HCT: 28.6 % — ABNORMAL LOW (ref 39.0–52.0)
Hemoglobin: 9.2 g/dL — ABNORMAL LOW (ref 13.0–17.0)
MCH: 31.1 pg (ref 26.0–34.0)
MCHC: 32.2 g/dL (ref 30.0–36.0)
MCV: 96.6 fL (ref 78.0–100.0)
PLATELETS: 287 10*3/uL (ref 150–400)
RBC: 2.96 MIL/uL — AB (ref 4.22–5.81)
RDW: 15.9 % — AB (ref 11.5–15.5)
WBC: 11.6 10*3/uL — AB (ref 4.0–10.5)

## 2014-11-29 LAB — GLUCOSE, CAPILLARY: GLUCOSE-CAPILLARY: 113 mg/dL — AB (ref 65–99)

## 2014-11-29 LAB — BASIC METABOLIC PANEL
ANION GAP: 4 — AB (ref 5–15)
BUN: 22 mg/dL — AB (ref 6–20)
CALCIUM: 8.4 mg/dL — AB (ref 8.9–10.3)
CO2: 29 mmol/L (ref 22–32)
Chloride: 104 mmol/L (ref 101–111)
Creatinine, Ser: 0.74 mg/dL (ref 0.61–1.24)
GFR calc Af Amer: >60 mL/min (ref >60)
Glucose, Bld: 129 mg/dL — ABNORMAL HIGH (ref 65–99)
POTASSIUM: 3.5 mmol/L (ref 3.5–5.1)
SODIUM: 137 mmol/L (ref 135–145)

## 2014-11-29 NOTE — Progress Notes (Addendum)
Nutrition Follow-up  DOCUMENTATION CODES:   Severe malnutrition in context of chronic illness, Underweight  INTERVENTION:  - Continue Osmolite 1.5 @ 45 mL/hr with 30 mL Prostat BID and 120 mL free water Q2h (free water for 14 hours/day) via PEG. This regimen provides 1820 kcal, 98 grams protein, and 1726 mL free water - Continue PO diet with Carn[employer] Instant Breakfast BID, each supplement provides 220 kcal and 13 grams of protein - RD will continue to monitor for needs  NUTRITION DIAGNOSIS:   Malnutrition related to dysphagia, cancer and cancer related treatments, chronic illness as evidenced by percent weight loss, severe depletion of body fat, severe depletion of muscle mass. -ongoing  GOAL:   Patient will meet greater than or equal to 90% of their needs -met with TF, meals, and supplements  MONITOR:   Labs, Weight trends, TF tolerance, Skin, I & O's, Supplement acceptance  ASSESSMENT:   69 year old male with past mental history of left-sided bronchogenic lung cancer recently discharged from the hospital on IV antibiotics for pseudomonal sepsis from pneumonia, return back on 8/29 for progressively worsening shortness of breath and found to have signs consistent with necrotizing pneumonia/possible empyema.  9/2 Pt sleeping soundly at time of visit and no visitors present; did not feel it was necessary to awake him at this time.   Osmolite 1.5 @ 45 ml/hr with 120 mL free water Q2h noted to be infusing. TF alone meets pt needs.  Will continue to monitor for needs, TF tolerance, and meal intakes. No meal intakes documented since breakfast yesterday AM: 15% of that meal.  Medications reviewed. Labs reviewed; CBGs: 96-126 mg/dL, BUN elevated, Ca: 8.4 mg/dL.   9/1 - Pt's TF at goal at 45 ml/hr. Pt tolerating well.  - Pt has not been receiving [employer] drinks. RD ordered one today. - RD to look into why supplements aren't being provided from kitchen. - Pt has  been ordering meals but states he can only eat a few bites before getting full. States that at this rate of TF he is not hungry.     Diet Order:  Diet heart healthy/carb modified Room service appropriate?: Yes; Fluid consistency:: Thin  Skin:  Reviewed, no issues  Last BM:  9/1  Height:   Ht Readings from Last 1 Encounters:  11/26/14 5' 8"  (1.727 m)    Weight:   Wt Readings from Last 1 Encounters:  11/29/14 100 lb 8.5 oz (45.6 kg)    Ideal Body Weight:  70 kg  BMI:  Body mass index is 15.29 kg/(m^2).  Estimated Nutritional Needs:   Kcal:  1800-2000  Protein:  95-105g  Fluid:  1.9L/day  EDUCATION NEEDS:   No education needs identified at this time     Jarome Matin, RD, LDN Inpatient Clinical Dietitian Pager # [phone] After hours/weekend pager # [phone]

## 2014-11-29 NOTE — Progress Notes (Signed)
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 Recently admitted for pseudomonas PNA. Readmitted 8/28 w/  Lethargy, confusion, increased SOB and  sputum production. PCCM asked to see re  CT scan findings of the chest done for PE protocol suggested a necrotizing pneumonia now seen along with a probable empyema.  SIGNIFICANT EVENTS  Right thora 8/31 150 ml frank pus Pleural fluid cx 8/31>>>GNR coccobacilli>>>   STUDIES:  CT scan 8/29: 1. No evidence of pulmonary embolus.2. Dense right lower lobe pneumonia noted. Pockets of fluid within the right lower lobe raise concern for necrotizing pneumonia.Underlying loculated peripherally enhancing small right pleural effusion seen, likely reflecting associated empyema.3. Right paratracheal mass has increased in size, measuring 5.1 x 4.3 cm, compatible with recurrent malignancy. Additional enlarged mediastinal nodes likely also reflect nodal metastases. 4. Diffuse coronary artery calcifications seen. 5. Common bile duct stent, with underlying pneumobilia..  SUBJECTIVE:  Feels about the same  VITAL SIGNS: Temp:  [97.3 F (36.3 C)-97.7 F (36.5 C)] 97.6 F (36.4 C) (09/02 0527) Pulse Rate:  [74-83] 83 (09/02 0527) Resp:  [18-20] 18 (09/02 0527) BP: (131-140)/(73-78) 131/78 mmHg (09/02 0527) SpO2:  [98 %-99 %] 98 % (09/02 0527) Weight:  [45.6 kg (100 lb 8.5 oz)] 45.6 kg (100 lb 8.5 oz) (09/02 0527) Room air  PHYSICAL EXAMINATION: General:  Frail , chr ill appearing Neuro:  Awake, oriented X 3, moves all ext  HEENT:  Temporal wasting MMM  Cardiovascular:  rrr Lungs:  Decreased on right, no accessory muscle use, cough green sputum  Abdomen:   Soft, non-tender  Musculoskeletal:  Intact  Skin:  Intact    Recent Labs Lab 11/26/14 0458 11/28/14 0410 11/29/14 0410  NA 139 141 137  K 3.6 3.0* 3.5  CL 107 104 104  CO2 '25 29 29  '$ BUN 21* 21* 22*  CREATININE 0.76 0.86 0.74  GLUCOSE 96 126* 129*    Recent Labs Lab 11/26/14 0458 11/28/14 0410 11/29/14 0410  HGB 9.3* 9.9* 9.2*  HCT 29.5* 30.5* 28.6*  WBC 12.4* 11.3* 11.6*  PLT 239 321 287   Dg Chest 2 View  11/29/2014   CLINICAL DATA:  Patient with past history of left-sided bronchogenic lung cancer recently discharged for sepsis and pneumonia.  EXAM: CHEST  2 VIEW  COMPARISON:  Chest radiograph 11/27/2014  FINDINGS: Right anterior chest wall Port-A-Cath stable in position with tip projecting over the superior cavoatrial junction. Stable cardiac and mediastinal contours with persistent right peritracheal mass. Persistent right mid and lower lung pulmonary consolidation with elevation of the right hemidiaphragm. The left lung is clear. Small right pleural effusion. No pneumothorax.  IMPRESSION: Stable right mid and lower lung consolidation.   Electronically Signed   By: Lovey Newcomer M.D.   On: 11/29/2014 09:13   Dg Chest Port 1 View  11/27/2014   CLINICAL DATA:  Status post right-sided thoracentesis.  EXAM: PORTABLE CHEST - 1 VIEW  COMPARISON:  11/26/2014  FINDINGS: The cardiac silhouette, mediastinal and hilar contours are stable. Persistent right lower lobe airspace opacity. No obvious pleural effusion. No postprocedural pneumothorax.  IMPRESSION: Stable chest x-ray findings. No postprocedural pneumothorax is identified.   Electronically Signed  By: Marijo Sanes M.D.   On: 11/27/2014 12:45    ASSESSMENT / PLAN:  Necrotizing PNA w/ complicated right empyema - pseudomonas NSCLCA: stage IV on immunotherapy and palliative XRT to abd mass.  8/31 US guided thoracentesis - Yielded about 150 ml of frank green blood tinged pus. Serial F/U US w/small pocket of residual fluid as of 9/2.  As mentioned before he is not a candidate for VATS, doubt that CT guided tube placement at this point would yield any further benefit.   Plan Cont current abx & await culture data D/c vanc  Will need prolonged abx course (likely excess of 4 weeks)  Seraj Dunnam,PETE [phone] 11/29/2014, 10:36 AM

## 2014-11-29 NOTE — Progress Notes (Signed)
PROGRESS NOTE  [name] IHK:[id number] DOB: 1945-08-16 DOA: 11/25/2014 PCP: [name], MD  HPI/Recap of past 37 hours: 69 year old male with past mental history of left-sided bronchogenic lung cancer recently discharged from the hospital on IV antibiotics for pseudomonal sepsis from pneumonia, return back on 8/29 for progressively worsening shortness of breath and found to have signs consistent with necrotizing pneumonia/possible empyema. Patient admitted to hospitalist service, started on IV antibiotics., Pulmonary consulted  Sepsis able to be ruled out. Despite necrotizing pneumonia, patient not hypoxic, breathing on room air.  Patient S/P thoracentesis fluid consistent with empyema.   Feeling well, no new complaints.   Assessment/Plan: Bronchogenic cancer of left lung: Mass seen on CT larger.  Needs to follow up with primary oncologist.   Hypertension:  Resume atenolol.   HCAP (healthcare-associated pneumonia): See below, continue antibiotics Necrotizing pneumonia/loculated pleural effusion: Ultrasound guided thoracentesis 8-31.  Continue with IV antibiotics. Cefta,  clindamycin.  Follow pleural fluid culture. Rare gram negative coccobacilli Plan to repeat Chest x ray 9-02. Persistent consolidation.  WBC trending down.   Protein-calorie malnutrition, severe: Receiving nutrition through PEG tube. Nutritionist consulted, tube feeding adjusted.   Hypokalemia; replaced.    Code Status: DO NOT RESUSCITATE  Family Communication: Wife present  Disposition Plan: awaiting culture results.   Consultants:  Pulmonary  Palliative care  Procedures:  Plan thoracentesis 8/31  Antibiotics:  IV Tressie Ellis 8/24-present (started since previous admission)  IV vancomycin 8/29-present   Objective: BP 131/78 mmHg  Pulse 83  Temp(Src) 97.6 F (36.4 C) (Oral)  Resp 18  Ht '5\' 8"'$  (1.727 m)  Wt 45.6 kg (100 lb 8.5 oz)  BMI 15.29 kg/m2  SpO2 98%  Intake/Output Summary  (Last 24 hours) at 11/29/14 1749 Last data filed at 11/29/14 1354  Gross per 24 hour  Intake    900 ml  Output    625 ml  Net    275 ml   Filed Weights   11/27/14 0500 11/28/14 0414 11/29/14 0527  Weight: 47.2 kg (104 lb 0.9 oz) 47.718 kg (105 lb 3.2 oz) 45.6 kg (100 lb 8.5 oz)    Exam:   General:  Emaciated, no acute distress.   Cardiovascular: Regular rate and rhythm, S1-S2  Respiratory: Decreased breath sounds throughout, limited inspiratory effort  Abdomen: Soft, nontender, nondistended, PEG tube in place  Musculoskeletal: No edema   Data Reviewed: Basic Metabolic Panel:  Recent Labs Lab 11/25/14 1741 11/26/14 0300 11/26/14 0458 11/28/14 0410 11/29/14 0410  NA 138 136 139 141 137  K 3.6 3.6 3.6 3.0* 3.5  CL 107 107 107 104 104  CO2 '26 25 25 29 29  '$ GLUCOSE 95 91 96 126* 129*  BUN 26* 21* 21* 21* 22*  CREATININE 0.76 0.70 0.76 0.86 0.74  CALCIUM 8.8* 8.7* 8.9 8.8* 8.4*   Liver Function Tests:  Recent Labs Lab 11/25/14 1741 11/26/14 0300 11/26/14 0458 11/27/14 1705  AST 49* 47* 46*  --   ALT 67* 64* 65*  --   ALKPHOS 398* 367* 376*  --   BILITOT 1.1 1.0 1.3*  --   PROT 5.8* 5.9* 6.1* 6.0*  ALBUMIN 2.0* 1.8* 2.0*  --    No results for input(s): LIPASE, AMYLASE in the last 168 hours. No results for input(s): AMMONIA in the last 168 hours. CBC:  Recent Labs Lab 11/25/14 1741 11/26/14 0300 11/26/14 0458 11/28/14 0410 11/29/14 0410  WBC 11.5* 11.0* 12.4* 11.3* 11.6*  NEUTROABS 9.5* 9.2*  --   --   --  HGB 9.4* 9.2* 9.3* 9.9* 9.2*  HCT 29.0* 28.3* 29.5* 30.5* 28.6*  MCV 96.7 96.6 96.4 96.2 96.6  PLT 244 229 239 321 287   Cardiac Enzymes:   No results for input(s): CKTOTAL, CKMB, CKMBINDEX, TROPONINI in the last 168 hours. BNP (last 3 results) No results for input(s): BNP in the last 8760 hours.  ProBNP (last 3 results) No results for input(s): PROBNP in the last 8760 hours.  CBG:  Recent Labs Lab 11/26/14 0806 11/27/14 0741  11/28/14 0738 11/29/14 0756  GLUCAP 96 105* 126* 113*    Recent Results (from the past 240 hour(s))  Culture, sputum-assessment     Status: None   Collection Time: 11/19/14  6:28 PM  Result Value Ref Range Status   Specimen Description Expect. Sput  Final   Special Requests NONE  Final   Sputum evaluation   Final    THIS SPECIMEN IS ACCEPTABLE. RESPIRATORY CULTURE REPORT TO FOLLOW.   Report Status 11/19/2014 FINAL  Final  Culture, respiratory (NON-Expectorated)     Status: None   Collection Time: 11/19/14  7:15 PM  Result Value Ref Range Status   Specimen Description SPUTUM  Final   Special Requests NONE  Final   Gram Stain   Final    ABUNDANT WBC PRESENT,BOTH PMN AND MONONUCLEAR RARE SQUAMOUS EPITHELIAL CELLS PRESENT NO ORGANISMS SEEN Performed at [facility]    Culture   Final    FEW PSEUDOMONAS AERUGINOSA Performed at [facility]    Report Status 11/22/2014 FINAL  Final   Organism ID, Bacteria PSEUDOMONAS AERUGINOSA  Final      Susceptibility   Pseudomonas aeruginosa - MIC*    CEFEPIME 2 SENSITIVE Sensitive     CEFTAZIDIME 4 SENSITIVE Sensitive     CIPROFLOXACIN <=0.25 SENSITIVE Sensitive     GENTAMICIN <=1 SENSITIVE Sensitive     IMIPENEM 2 SENSITIVE Sensitive     PIP/TAZO 8 SENSITIVE Sensitive     TOBRAMYCIN <=1 SENSITIVE Sensitive     * FEW PSEUDOMONAS AERUGINOSA  Culture, blood (routine x 2)     Status: None (Preliminary result)   Collection Time: 11/25/14 10:18 PM  Result Value Ref Range Status   Specimen Description BLOOD RIGHT ANTECUBITAL  Final   Special Requests BOTTLES DRAWN AEROBIC AND ANAEROBIC 5CC  Final   Culture   Final    NO GROWTH 3 DAYS Performed at [facility]    Report Status PENDING  Incomplete  Culture, blood (routine x 2)     Status: None (Preliminary result)   Collection Time: 11/25/14 10:20 PM  Result Value Ref Range Status   Specimen Description BLOOD LEFT ANTECUBITAL  Final   Special Requests BOTTLES DRAWN  AEROBIC AND ANAEROBIC 5CC  Final   Culture   Final    NO GROWTH 3 DAYS Performed at [facility]    Report Status PENDING  Incomplete  AFB culture with smear     Status: None (Preliminary result)   Collection Time: 11/27/14 11:55 AM  Result Value Ref Range Status   Specimen Description PLEURAL RIGHT  Final   Special Requests Immunocompromised  Final   Acid Fast Smear   Final    NO ACID FAST BACILLI SEEN Performed at [facility]    Culture   Final    CULTURE WILL BE EXAMINED FOR 6 WEEKS BEFORE ISSUING A FINAL REPORT Performed at [facility]    Report Status PENDING  Incomplete  Body fluid culture  Status: None (Preliminary result)   Collection Time: 11/27/14 11:55 AM  Result Value Ref Range Status   Specimen Description FLUID RIGHT PLEURAL  Final   Special Requests Immunocompromised  Final   Gram Stain   Final    ABUNDANT WBC PRESENT, PREDOMINANTLY PMN RARE GRAM NEGATIVE COCCOBACILLI    Culture   Final    NO GROWTH 2 DAYS Performed at [facility]    Report Status PENDING  Incomplete  Fungus Culture with Smear     Status: None (Preliminary result)   Collection Time: 11/27/14 11:55 AM  Result Value Ref Range Status   Specimen Description PLEURAL  Final   Special Requests NONE  Final   Fungal Smear   Final    NO YEAST OR FUNGAL ELEMENTS SEEN Performed at [facility]    Culture   Final    CULTURE IN PROGRESS FOR FOUR WEEKS Performed at [facility]    Report Status PENDING  Incomplete     Studies: Dg Chest 2 View  11/29/2014   CLINICAL DATA:  Patient with past history of left-sided bronchogenic lung cancer recently discharged for sepsis and pneumonia.  EXAM: CHEST  2 VIEW  COMPARISON:  Chest radiograph 11/27/2014  FINDINGS: Right anterior chest wall Port-A-Cath stable in position with tip projecting over the superior cavoatrial junction. Stable cardiac and mediastinal contours with persistent right peritracheal mass.  Persistent right mid and lower lung pulmonary consolidation with elevation of the right hemidiaphragm. The left lung is clear. Small right pleural effusion. No pneumothorax.  IMPRESSION: Stable right mid and lower lung consolidation.   Electronically Signed   By: Lovey Newcomer M.D.   On: 11/29/2014 09:13    Scheduled Meds: . atenolol  50 mg Oral Daily  . B-complex with vitamin C  1 tablet Oral Daily  . cefTAZidime (FORTAZ)  IV  2 g Intravenous Q8H  . clindamycin (CLEOCIN) IV  600 mg Intravenous 4 times per day  . docusate sodium  100 mg Oral BID  . feeding supplement (PRO-STAT SUGAR FREE 64)  30 mL Per Tube BID  . ferrous sulfate  325 mg Oral TID WC  . folic acid  1 mg Oral Daily  . mirabegron ER  50 mg Oral BID  . multivitamin with minerals  1 tablet Oral Daily  . OLANZapine  10 mg Oral QHS  . pantoprazole  40 mg Oral Daily  . saccharomyces boulardii  250 mg Oral Daily  . sodium chloride  3 mL Intravenous Q12H  . thiamine  100 mg Oral Daily    Continuous Infusions: . sodium chloride 50 mL/hr at 11/27/14 2117  . feeding supplement (OSMOLITE 1.5 CAL) 1,000 mL (11/29/14 1425)     Time spent: 25 minutes  Patriece Archbold, [place] [facility] Pager [phone]. If 7PM-7AM, please contact night-coverage at [email or web address], password [facility] 11/29/2014, 5:49 PM  LOS: 4 days

## 2014-11-29 NOTE — Patient Outreach (Signed)
[place] [facility]) Care Management  11/29/2014  [name] 01/16/1946 [id number]   Referral from Marthenia Rolling, RN, assigned Deloria Lair, NP (for [name], RN).  Thanks, Ronnell Freshwater. [place], [place] Assistant Phone: [phone] Fax: [phone]

## 2014-11-30 LAB — BODY FLUID CULTURE: CULTURE: NO GROWTH

## 2014-11-30 LAB — GLUCOSE, CAPILLARY: Glucose-Capillary: 115 mg/dL — ABNORMAL HIGH (ref 65–99)

## 2014-11-30 LAB — CBC
HEMATOCRIT: 30.5 % — AB (ref 39.0–52.0)
Hemoglobin: 9.6 g/dL — ABNORMAL LOW (ref 13.0–17.0)
MCH: 30.4 pg (ref 26.0–34.0)
MCHC: 31.5 g/dL (ref 30.0–36.0)
MCV: 96.5 fL (ref 78.0–100.0)
Platelets: 293 10*3/uL (ref 150–400)
RBC: 3.16 MIL/uL — ABNORMAL LOW (ref 4.22–5.81)
RDW: 16 % — AB (ref 11.5–15.5)
WBC: 13.2 10*3/uL — ABNORMAL HIGH (ref 4.0–10.5)

## 2014-11-30 LAB — BASIC METABOLIC PANEL
Anion gap: 6 (ref 5–15)
BUN: 20 mg/dL (ref 6–20)
CALCIUM: 8.5 mg/dL — AB (ref 8.9–10.3)
CO2: 28 mmol/L (ref 22–32)
CREATININE: 0.75 mg/dL (ref 0.61–1.24)
Chloride: 99 mmol/L — ABNORMAL LOW (ref 101–111)
GFR calc non Af Amer: >60 mL/min (ref >60)
GLUCOSE: 116 mg/dL — AB (ref 65–99)
Potassium: 3.3 mmol/L — ABNORMAL LOW (ref 3.5–5.1)
Sodium: 133 mmol/L — ABNORMAL LOW (ref 135–145)

## 2014-11-30 MED ORDER — JEVITY 1.2 CAL PO LIQD: 1000.0000 mL | ORAL | Status: DC

## 2014-11-30 MED ORDER — POTASSIUM CHLORIDE CRYS ER 20 MEQ PO TBCR
40.0000 meq | EXTENDED_RELEASE_TABLET | Freq: Once | ORAL | Status: AC
  Administered 2014-11-30: 40 meq via ORAL
  Filled 2014-11-30: qty 2

## 2014-11-30 MED ORDER — POTASSIUM CHLORIDE CRYS ER 20 MEQ PO TBCR
40.0000 meq | EXTENDED_RELEASE_TABLET | Freq: Once | ORAL | Status: DC
  Filled 2014-11-30 (×2): qty 2

## 2014-11-30 NOTE — Progress Notes (Signed)
PROGRESS NOTE  [name] EGB:[id number] DOB: 1946-03-11 DOA: 11/25/2014 PCP: [name], MD  HPI/Recap of past 83 hours: 69 year old male with past mental history of left-sided bronchogenic lung cancer recently discharged from the hospital on IV antibiotics for pseudomonal sepsis from pneumonia, return back on 8/29 for progressively worsening shortness of breath and found to have signs consistent with necrotizing pneumonia/possible empyema. Patient admitted to hospitalist service, started on IV antibiotics., Pulmonary consulted  Sepsis able to be ruled out. Despite necrotizing pneumonia, patient not hypoxic, breathing on room air.  Patient S/P thoracentesis fluid consistent with empyema.   Relates loose stool. Denies abdominal pain. Breathing ok.    Assessment/Plan: HCAP (healthcare-associated pneumonia): See below, continue antibiotics Necrotizing pneumonia/loculated pleural effusion: Ultrasound guided thoracentesis 8-31.  Continue with IV antibiotics. Cefta,  clindamycin.  Follow pleural fluid culture. Rare gram negative coccobacilli Plan to repeat Chest x ray 9-02. Persistent consolidation.  WBC mildly increase today. Follow trend.  Awaiting culture results.   Bronchogenic cancer of left lung: Mass seen on CT larger.  Needs to follow up with primary oncologist.   Hypertension:  Continue with  atenolol.   Protein-calorie malnutrition, severe: Receiving nutrition through PEG tube. Nutritionist consulted, tube feeding adjusted.    Hypokalemia; oral supplement.   Diarrhea; last dose of stool softener was  9-01. Will ask nutritionist about changing formula. If diarrhea persist will check for C. D iff.    Code Status: DO NOT RESUSCITATE  Family Communication: Wife present  Disposition Plan: awaiting culture results.   Consultants:  Pulmonary  Palliative care  Procedures:  Plan thoracentesis 8/31  Antibiotics:  IV Tressie Ellis 8/24-present (started since  previous admission)  IV vancomycin 8/29-present   Objective: BP 121/89 mmHg  Pulse 88  Temp(Src) 97.9 F (36.6 C) (Oral)  Resp 16  Ht '5\' 8"'$  (1.727 m)  Wt 45.768 kg (100 lb 14.4 oz)  BMI 15.35 kg/m2  SpO2 99%  Intake/Output Summary (Last 24 hours) at 11/30/14 1439 Last data filed at 11/30/14 1230  Gross per 24 hour  Intake    765 ml  Output    800 ml  Net    -35 ml   Filed Weights   11/28/14 0414 11/29/14 0527 11/30/14 0419  Weight: 47.718 kg (105 lb 3.2 oz) 45.6 kg (100 lb 8.5 oz) 45.768 kg (100 lb 14.4 oz)    Exam:   General:  Emaciated, no acute distress.   Cardiovascular: Regular rate and rhythm, S1-S2  Respiratory: Decreased breath sounds throughout, limited inspiratory effort  Abdomen: Soft, nontender, nondistended, PEG tube in place  Musculoskeletal: No edema   Data Reviewed: Basic Metabolic Panel:  Recent Labs Lab 11/26/14 0300 11/26/14 0458 11/28/14 0410 11/29/14 0410 11/30/14 0553  NA 136 139 141 137 133*  K 3.6 3.6 3.0* 3.5 3.3*  CL 107 107 104 104 99*  CO2 '25 25 29 29 28  '$ GLUCOSE 91 96 126* 129* 116*  BUN 21* 21* 21* 22* 20  CREATININE 0.70 0.76 0.86 0.74 0.75  CALCIUM 8.7* 8.9 8.8* 8.4* 8.5*   Liver Function Tests:  Recent Labs Lab 11/25/14 1741 11/26/14 0300 11/26/14 0458 11/27/14 1705  AST 49* 47* 46*  --   ALT 67* 64* 65*  --   ALKPHOS 398* 367* 376*  --   BILITOT 1.1 1.0 1.3*  --   PROT 5.8* 5.9* 6.1* 6.0*  ALBUMIN 2.0* 1.8* 2.0*  --    No results for input(s): LIPASE, AMYLASE in the last 168 hours. No results  for input(s): AMMONIA in the last 168 hours. CBC:  Recent Labs Lab 11/25/14 1741 11/26/14 0300 11/26/14 0458 11/28/14 0410 11/29/14 0410 11/30/14 0553  WBC 11.5* 11.0* 12.4* 11.3* 11.6* 13.2*  NEUTROABS 9.5* 9.2*  --   --   --   --   HGB 9.4* 9.2* 9.3* 9.9* 9.2* 9.6*  HCT 29.0* 28.3* 29.5* 30.5* 28.6* 30.5*  MCV 96.7 96.6 96.4 96.2 96.6 96.5  PLT 244 229 239 321 287 293   Cardiac Enzymes:   No results  for input(s): CKTOTAL, CKMB, CKMBINDEX, TROPONINI in the last 168 hours. BNP (last 3 results) No results for input(s): BNP in the last 8760 hours.  ProBNP (last 3 results) No results for input(s): PROBNP in the last 8760 hours.  CBG:  Recent Labs Lab 11/26/14 0806 11/27/14 0741 11/28/14 0738 11/29/14 0756 11/30/14 0722  GLUCAP 96 105* 126* 113* 115*    Recent Results (from the past 240 hour(s))  Culture, blood (routine x 2)     Status: None (Preliminary result)   Collection Time: 11/25/14 10:18 PM  Result Value Ref Range Status   Specimen Description BLOOD RIGHT ANTECUBITAL  Final   Special Requests BOTTLES DRAWN AEROBIC AND ANAEROBIC 5CC  Final   Culture   Final    NO GROWTH 4 DAYS Performed at [facility]    Report Status PENDING  Incomplete  Culture, blood (routine x 2)     Status: None (Preliminary result)   Collection Time: 11/25/14 10:20 PM  Result Value Ref Range Status   Specimen Description BLOOD LEFT ANTECUBITAL  Final   Special Requests BOTTLES DRAWN AEROBIC AND ANAEROBIC 5CC  Final   Culture   Final    NO GROWTH 4 DAYS Performed at [facility]    Report Status PENDING  Incomplete  Anaerobic culture     Status: None (Preliminary result)   Collection Time: 11/27/14 11:52 AM  Result Value Ref Range Status   Specimen Description FLUID RIGHT PLEURAL  Final   Special Requests Immunocompromised  Final   Gram Stain   Final    ABUNDANT WBC PRESENT, PREDOMINANTLY PMN RARE GRAM NEGATIVE COCCOBACILLI    Culture   Final    HOLDING FOR POSSIBLE ANAEROBE Performed at [facility]    Report Status PENDING  Incomplete  AFB culture with smear     Status: None (Preliminary result)   Collection Time: 11/27/14 11:55 AM  Result Value Ref Range Status   Specimen Description PLEURAL RIGHT  Final   Special Requests Immunocompromised  Final   Acid Fast Smear   Final    NO ACID FAST BACILLI SEEN Performed at [facility]    Culture    Final    CULTURE WILL BE EXAMINED FOR 6 WEEKS BEFORE ISSUING A FINAL REPORT Performed at [facility]    Report Status PENDING  Incomplete  Body fluid culture     Status: None   Collection Time: 11/27/14 11:55 AM  Result Value Ref Range Status   Specimen Description FLUID RIGHT PLEURAL  Final   Special Requests Immunocompromised  Final   Gram Stain   Final    ABUNDANT WBC PRESENT, PREDOMINANTLY PMN RARE GRAM NEGATIVE COCCOBACILLI    Culture   Final    NO GROWTH 3 DAYS Performed at [facility]    Report Status 11/30/2014 FINAL  Final  Fungus Culture with Smear     Status: None (Preliminary result)   Collection Time: 11/27/14 11:55  AM  Result Value Ref Range Status   Specimen Description PLEURAL  Final   Special Requests NONE  Final   Fungal Smear   Final    NO YEAST OR FUNGAL ELEMENTS SEEN Performed at [facility]    Culture   Final    CULTURE IN PROGRESS FOR FOUR WEEKS Performed at [facility]    Report Status PENDING  Incomplete     Studies: No results found.  Scheduled Meds: . atenolol  50 mg Oral Daily  . B-complex with vitamin C  1 tablet Oral Daily  . cefTAZidime (FORTAZ)  IV  2 g Intravenous Q8H  . clindamycin (CLEOCIN) IV  600 mg Intravenous 4 times per day  . feeding supplement (PRO-STAT SUGAR FREE 64)  30 mL Per Tube BID  . ferrous sulfate  325 mg Oral TID WC  . folic acid  1 mg Oral Daily  . mirabegron ER  50 mg Oral BID  . multivitamin with minerals  1 tablet Oral Daily  . OLANZapine  10 mg Oral QHS  . pantoprazole  40 mg Oral Daily  . potassium chloride  40 mEq Oral Once  . saccharomyces boulardii  250 mg Oral Daily  . sodium chloride  3 mL Intravenous Q12H  . thiamine  100 mg Oral Daily    Continuous Infusions: . feeding supplement (OSMOLITE 1.5 CAL) 1,000 mL (11/30/14 1111)     Time spent: 25 minutes  Izzy Courville, [facility] Pager [phone]. If 7PM-7AM, please contact night-coverage at  [email or web address], password [facility] 11/30/2014, 2:39 PM  LOS: 5 days

## 2014-11-30 NOTE — Progress Notes (Signed)
Brief Nutrition Note  Consult received for enteral/tube feeding initiation and management.  Consult comments had asked about switching formulas for loose stools.   Pt currently on home TF regimen. Loose stools  likely more attributable to the multiple abx patient is receiving. RD operating remotely today, will defer the decision of changing patients TF to RD monitoring patient.   Adult Enteral Nutrition Protocol initiated. Full assessment to follow.  Admitting Dx: Necrotizing pneumonia [J85.0] Sepsis [A41.9]  Body mass index is 15.35 kg/(m^2). Pt meets criteria for Underweight based on current BMI.  Labs:   Recent Labs Lab 11/28/14 0410 11/29/14 0410 11/30/14 0553  NA 141 137 133*  K 3.0* 3.5 3.3*  CL 104 104 99*  CO2 '29 29 28  '$ BUN 21* 22* 20  CREATININE 0.86 0.74 0.75  CALCIUM 8.8* 8.4* 8.5*  GLUCOSE 126* 129* 116*    Burtis Junes RD, LDN Nutrition Pager: [phone] 11/30/2014 11:42 AM

## 2014-12-01 LAB — CBC
HCT: 30.2 % — ABNORMAL LOW (ref 39.0–52.0)
HEMOGLOBIN: 9.5 g/dL — AB (ref 13.0–17.0)
MCH: 30.4 pg (ref 26.0–34.0)
MCHC: 31.5 g/dL (ref 30.0–36.0)
MCV: 96.8 fL (ref 78.0–100.0)
Platelets: 257 10*3/uL (ref 150–400)
RBC: 3.12 MIL/uL — AB (ref 4.22–5.81)
RDW: 16.1 % — ABNORMAL HIGH (ref 11.5–15.5)
WBC: 11.6 10*3/uL — AB (ref 4.0–10.5)

## 2014-12-01 LAB — C DIFFICILE QUICK SCREEN W PCR REFLEX
C DIFFICILE (CDIFF) TOXIN: NEGATIVE
C DIFFICLE (CDIFF) ANTIGEN: NEGATIVE
C Diff interpretation: NEGATIVE

## 2014-12-01 LAB — GLUCOSE, CAPILLARY: GLUCOSE-CAPILLARY: 136 mg/dL — AB (ref 65–99)

## 2014-12-01 LAB — CULTURE, BLOOD (ROUTINE X 2)
CULTURE: NO GROWTH
Culture: NO GROWTH

## 2014-12-01 LAB — BASIC METABOLIC PANEL
ANION GAP: 5 (ref 5–15)
BUN: 22 mg/dL — AB (ref 6–20)
CHLORIDE: 101 mmol/L (ref 101–111)
CO2: 31 mmol/L (ref 22–32)
Calcium: 8.7 mg/dL — ABNORMAL LOW (ref 8.9–10.3)
Creatinine, Ser: 0.9 mg/dL (ref 0.61–1.24)
Glucose, Bld: 135 mg/dL — ABNORMAL HIGH (ref 65–99)
POTASSIUM: 3.9 mmol/L (ref 3.5–5.1)
SODIUM: 137 mmol/L (ref 135–145)

## 2014-12-01 MED ORDER — OSMOLITE 1.5 CAL PO LIQD
1000.0000 mL | ORAL | Status: DC
  Administered 2014-12-01 – 2014-12-02 (×2): 1000 mL
  Filled 2014-12-01 (×3): qty 1000

## 2014-12-01 MED ORDER — PRO-STAT SUGAR FREE PO LIQD
30.0000 mL | Freq: Three times a day (TID) | ORAL | Status: DC
  Administered 2014-12-01 – 2014-12-02 (×2): 30 mL
  Filled 2014-12-01 (×2): qty 30

## 2014-12-01 NOTE — Progress Notes (Signed)
PROGRESS NOTE  [name] GXQ:[id number] DOB: Aug 24, 1945 DOA: 11/25/2014 PCP: [name], MD  HPI/Recap of past 37 hours: 69 year old male with past mental history of left-sided bronchogenic lung cancer recently discharged from the hospital on IV antibiotics for pseudomonal sepsis from pneumonia, return back on 8/29 for progressively worsening shortness of breath and found to have signs consistent with necrotizing pneumonia/possible empyema. Patient admitted to hospitalist service, started on IV antibiotics., Pulmonary consulted  Sepsis able to be ruled out. Despite necrotizing pneumonia, patient not hypoxic, breathing on room air.  Patient S/P thoracentesis fluid consistent with empyema.   Feeling better today. Awaiting culture results. Had 4 BM yesterday. 2 BM today    Assessment/Plan: HCAP (healthcare-associated pneumonia): See below, continue antibiotics Necrotizing pneumonia/loculated pleural effusion: Ultrasound guided thoracentesis 8-31.  Continue with IV antibiotics. Cefta,  clindamycin.  Follow pleural fluid culture. Rare gram negative coccobacilli Plan to repeat Chest x ray 9-02. Persistent consolidation.  WBC mildly increase today. Follow trend.  Awaiting culture results.   Bronchogenic cancer of left lung: Mass seen on CT larger.  Needs to follow up with primary oncologist.   Hypertension:  Continue with  atenolol.   Protein-calorie malnutrition, severe: Receiving nutrition through PEG tube. Nutritionist consulted, tube feeding adjusted.    Hypokalemia; resolved  Diarrhea; last dose of stool softener was  9-01. asked nutritionist about changing formula. If diarrhea persist will check for C. D iff.    Code Status: DO NOT RESUSCITATE  Family Communication: Wife present  Disposition Plan: awaiting culture results.   Consultants:  Pulmonary  Palliative care  Procedures:  Plan thoracentesis 8/31  Antibiotics:  IV Tressie Ellis 8/24-present (started  since previous admission)  IV vancomycin 8/29-present   Objective: BP 110/71 mmHg  Pulse 81  Temp(Src) 98.1 F (36.7 C) (Oral)  Resp 16  Ht '5\' 8"'$  (1.727 m)  Wt 45.711 kg (100 lb 12.4 oz)  BMI 15.33 kg/m2  SpO2 97%  Intake/Output Summary (Last 24 hours) at 12/01/14 1246 Last data filed at 12/01/14 0900  Gross per 24 hour  Intake    120 ml  Output      0 ml  Net    120 ml   Filed Weights   11/29/14 0527 11/30/14 0419 12/01/14 0758  Weight: 45.6 kg (100 lb 8.5 oz) 45.768 kg (100 lb 14.4 oz) 45.711 kg (100 lb 12.4 oz)    Exam:   General:  Emaciated, no acute distress.   Cardiovascular: Regular rate and rhythm, S1-S2  Respiratory: Decreased breath sounds throughout, limited inspiratory effort  Abdomen: Soft, nontender, nondistended, PEG tube in place  Musculoskeletal: No edema   Data Reviewed: Basic Metabolic Panel:  Recent Labs Lab 11/26/14 0458 11/28/14 0410 11/29/14 0410 11/30/14 0553 12/01/14 0558  NA 139 141 137 133* 137  K 3.6 3.0* 3.5 3.3* 3.9  CL 107 104 104 99* 101  CO2 '25 29 29 28 31  '$ GLUCOSE 96 126* 129* 116* 135*  BUN 21* 21* 22* 20 22*  CREATININE 0.76 0.86 0.74 0.75 0.90  CALCIUM 8.9 8.8* 8.4* 8.5* 8.7*   Liver Function Tests:  Recent Labs Lab 11/25/14 1741 11/26/14 0300 11/26/14 0458 11/27/14 1705  AST 49* 47* 46*  --   ALT 67* 64* 65*  --   ALKPHOS 398* 367* 376*  --   BILITOT 1.1 1.0 1.3*  --   PROT 5.8* 5.9* 6.1* 6.0*  ALBUMIN 2.0* 1.8* 2.0*  --    No results for input(s): LIPASE, AMYLASE in the last  168 hours. No results for input(s): AMMONIA in the last 168 hours. CBC:  Recent Labs Lab 11/25/14 1741 11/26/14 0300 11/26/14 0458 11/28/14 0410 11/29/14 0410 11/30/14 0553 12/01/14 0558  WBC 11.5* 11.0* 12.4* 11.3* 11.6* 13.2* 11.6*  NEUTROABS 9.5* 9.2*  --   --   --   --   --   HGB 9.4* 9.2* 9.3* 9.9* 9.2* 9.6* 9.5*  HCT 29.0* 28.3* 29.5* 30.5* 28.6* 30.5* 30.2*  MCV 96.7 96.6 96.4 96.2 96.6 96.5 96.8  PLT 244 229  239 321 287 293 257   Cardiac Enzymes:   No results for input(s): CKTOTAL, CKMB, CKMBINDEX, TROPONINI in the last 168 hours. BNP (last 3 results) No results for input(s): BNP in the last 8760 hours.  ProBNP (last 3 results) No results for input(s): PROBNP in the last 8760 hours.  CBG:  Recent Labs Lab 11/27/14 0741 11/28/14 0738 11/29/14 0756 11/30/14 0722 12/01/14 0751  GLUCAP 105* 126* 113* 115* 136*    Recent Results (from the past 240 hour(s))  Culture, blood (routine x 2)     Status: None (Preliminary result)   Collection Time: 11/25/14 10:18 PM  Result Value Ref Range Status   Specimen Description BLOOD RIGHT ANTECUBITAL  Final   Special Requests BOTTLES DRAWN AEROBIC AND ANAEROBIC 5CC  Final   Culture   Final    NO GROWTH 4 DAYS Performed at [facility]    Report Status PENDING  Incomplete  Culture, blood (routine x 2)     Status: None (Preliminary result)   Collection Time: 11/25/14 10:20 PM  Result Value Ref Range Status   Specimen Description BLOOD LEFT ANTECUBITAL  Final   Special Requests BOTTLES DRAWN AEROBIC AND ANAEROBIC 5CC  Final   Culture   Final    NO GROWTH 4 DAYS Performed at [facility]    Report Status PENDING  Incomplete  Anaerobic culture     Status: None (Preliminary result)   Collection Time: 11/27/14 11:52 AM  Result Value Ref Range Status   Specimen Description FLUID RIGHT PLEURAL  Final   Special Requests Immunocompromised  Final   Gram Stain   Final    ABUNDANT WBC PRESENT, PREDOMINANTLY PMN RARE GRAM NEGATIVE COCCOBACILLI    Culture   Final    HOLDING FOR POSSIBLE ANAEROBE Performed at [facility]    Report Status PENDING  Incomplete  AFB culture with smear     Status: None (Preliminary result)   Collection Time: 11/27/14 11:55 AM  Result Value Ref Range Status   Specimen Description PLEURAL RIGHT  Final   Special Requests Immunocompromised  Final   Acid Fast Smear   Final    NO ACID FAST BACILLI  SEEN Performed at [facility]    Culture   Final    CULTURE WILL BE EXAMINED FOR 6 WEEKS BEFORE ISSUING A FINAL REPORT Performed at [facility]    Report Status PENDING  Incomplete  Body fluid culture     Status: None   Collection Time: 11/27/14 11:55 AM  Result Value Ref Range Status   Specimen Description FLUID RIGHT PLEURAL  Final   Special Requests Immunocompromised  Final   Gram Stain   Final    ABUNDANT WBC PRESENT, PREDOMINANTLY PMN RARE GRAM NEGATIVE COCCOBACILLI    Culture   Final    NO GROWTH 3 DAYS Performed at [facility]    Report Status 11/30/2014 FINAL  Final  Fungus Culture with Smear  Status: None (Preliminary result)   Collection Time: 11/27/14 11:55 AM  Result Value Ref Range Status   Specimen Description PLEURAL  Final   Special Requests NONE  Final   Fungal Smear   Final    NO YEAST OR FUNGAL ELEMENTS SEEN Performed at [facility]    Culture   Final    CULTURE IN PROGRESS FOR FOUR WEEKS Performed at [facility]    Report Status PENDING  Incomplete     Studies: No results found.  Scheduled Meds: . atenolol  50 mg Oral Daily  . B-complex with vitamin C  1 tablet Oral Daily  . cefTAZidime (FORTAZ)  IV  2 g Intravenous Q8H  . clindamycin (CLEOCIN) IV  600 mg Intravenous 4 times per day  . feeding supplement (PRO-STAT SUGAR FREE 64)  30 mL Per Tube TID  . ferrous sulfate  325 mg Oral TID WC  . folic acid  1 mg Oral Daily  . mirabegron ER  50 mg Oral BID  . multivitamin with minerals  1 tablet Oral Daily  . OLANZapine  10 mg Oral QHS  . pantoprazole  40 mg Oral Daily  . potassium chloride  40 mEq Oral Once  . saccharomyces boulardii  250 mg Oral Daily  . sodium chloride  3 mL Intravenous Q12H  . thiamine  100 mg Oral Daily    Continuous Infusions: . feeding supplement (OSMOLITE 1.5 CAL)       Time spent: 25 minutes  Druscilla Petsch, [facility] Pager [phone]. If 7PM-7AM, please  contact night-coverage at [email or web address], password [facility] 12/01/2014, 12:46 PM  LOS: 6 days

## 2014-12-01 NOTE — Progress Notes (Addendum)
Nutrition Follow-up  DOCUMENTATION CODES:   Severe malnutrition in context of chronic illness, Underweight  INTERVENTION:   Decrease Osmolite 1.5 to 40 ml/hr.  Increase 30 ml Prostat to TID 120 ml free water Q2hrs (free water for 14 hours/day) via PEG. Provides 1740 kcal, 105g protein and 1572 ml of H2O.  -Continue PO diet with Carnation Instant Breakfast BID, each supplement provides 220 kcal and 13 grams of protein - RD will continue to monitor for needs  NUTRITION DIAGNOSIS:   Malnutrition related to dysphagia, cancer and cancer related treatments, chronic illness as evidenced by percent weight loss, severe depletion of body fat, severe depletion of muscle mass.  Ongoing.  GOAL:   Patient will meet greater than or equal to 90% of their needs  Meeting.  MONITOR:   Labs, Weight trends, TF tolerance, Skin, I & O's, Supplement acceptance  REASON FOR ASSESSMENT:   Consult Enteral/tube feeding initiation and management  ASSESSMENT:   69 year old male with past mental history of left-sided bronchogenic lung cancer recently discharged from the hospital on IV antibiotics for pseudomonal sepsis from pneumonia, return back on 8/29 for progressively worsening shortness of breath and found to have signs consistent with necrotizing pneumonia/possible empyema.  RD consulted by MD d/t pt having loose stools. RD does not recommend switching formula as this is the formula the patient is on at home and tolerates it well. Pt started on antibiotics during this admission and most likely is contributing to loose stools. (Pt expected to be on antibiotics for extended time per critical care).  RD will decrease rate to 40 ml/hr and will increase 30 ml Prostat to TID. This meets 97% of kcal needs and 100% of protein needs.  Pt agreeable to this.   Labs reviewed: CBGs: 113-136 Elevated BUN  Diet Order:  Diet regular Room service appropriate?: Yes; Fluid consistency:: Thin  Skin:  Reviewed,  no issues  Last BM:  9/3 -loose stools  Height:   Ht Readings from Last 1 Encounters:  11/26/14 '5\' 8"'$  (1.727 m)    Weight:   Wt Readings from Last 1 Encounters:  12/01/14 100 lb 12.4 oz (45.711 kg)    Ideal Body Weight:  70 kg  BMI:  Body mass index is 15.33 kg/(m^2).  Estimated Nutritional Needs:   Kcal:  1800-2000  Protein:  95-105g  Fluid:  1.9L/day  EDUCATION NEEDS:   No education needs identified at this time  Clayton Bibles, MS, RD, LDN Pager: [phone] After Hours Pager: [phone]

## 2014-12-01 NOTE — Progress Notes (Signed)
ANTIBIOTIC CONSULT NOTE - FOLLOW UP  Pharmacy Consult for Ceftazidime, may renally adjust antibiotics (Clindamycin) Indication: pneumonia  Allergies  Allergen Reactions  . Augmentin [Amoxicillin-Pot Clavulanate] Diarrhea  . Carboplatin     Rash and itching after carbo test dose  . Levaquin [Levofloxacin] Other (See Comments)    Knee pain  . Percocet [Oxycodone-Acetaminophen] Nausea And Vomiting  . Zolpidem Tartrate Diarrhea    Patient Measurements: Height: '5\' 8"'$  (172.7 cm) Weight: 100 lb 12.4 oz (45.711 kg) IBW/kg (Calculated) : 68.4  Vital Signs: Temp: 98 F (36.7 C) (09/04 1330) Temp Source: Oral (09/04 1330) BP: 113/81 mmHg (09/04 1330) Pulse Rate: 80 (09/04 1330) Intake/Output from previous day: 09/03 0701 - 09/04 0700 In: 435 [P.O.:435] Out: -   Labs:  Recent Labs  11/29/14 0410 11/30/14 0553 12/01/14 0558  WBC 11.6* 13.2* 11.6*  HGB 9.2* 9.6* 9.5*  PLT 287 293 257  CREATININE 0.74 0.75 0.90   Estimated Creatinine Clearance: 50.1 mL/min (by C-G formula based on Cr of 0.9). No results for input(s): VANCOTROUGH, VANCOPEAK, VANCORANDOM, GENTTROUGH, GENTPEAK, GENTRANDOM, TOBRATROUGH, TOBRAPEAK, TOBRARND, AMIKACINPEAK, AMIKACINTROU, AMIKACIN in the last 72 hours.   Assessment: 53 yoM admitted on 8/29 with hx non-small cell Lung Ca d/c'd from hospital 8/26 with PNA (Pseudomonas in sputum culture). Now with increased SOB with mobility and cough with green sputum. CXR post thoracentesis shows persistent RLL opacity. Thick purulent pleural fluid c/w empyema - not a surgical or chest tube candidate and will need long-term abx.  Patient currently on Ceftazidime and Clindamycin for nectrotizing PNA.  8/29 >> cefepime >> x1 ED 8/29 >> vancomycin >> 9/2 8/30 >> ceftazidime >>  8/30 >> clindamycin >>  8/29 blood: NGF 8/29 sputum: ordered 8/29 Urine Strep Ag: negative 8/29 Urine Legionella Ag: negative 8/30 HIV: NR 8/31 Pleural fluid (rt, thoracentesis): rare  gram neg coccobacilli on gram stain, final cx NGF 8/31 Pleural fluid, anaerobic culture: rare gram negative coccobacilli, holding for possible anaerobe 8/31 Pleural fluid, fungus culture: no yeast or fungal elements seen, cx in progress x 4 wks 8/31 Pleural fluid, AFB culture: no AFB seen, cx in progress x 6 wks 9/4: Cdiff antigen + toxin: negative  Today, 12/01/2014:  Afebrile  WBC elevated but improving, 11.6  SCr 0.9 with CrCl ~ 50 ml/min  Goal of Therapy:  Appropriate abx dosing, eradication of infection.   Plan:   Continue clindamycin '600mg'$  IV q6h  Continue ceftazidime 2g IV q8h  Follow up renal fxn, culture results, and clinical course.   Lindell Spar, PharmD, BCPS Pager: [phone] 12/01/2014 2:55 PM

## 2014-12-02 ENCOUNTER — Inpatient Hospital Stay (HOSPITAL_COMMUNITY): Payer: Medicare Other

## 2014-12-02 DIAGNOSIS — Z9889 Other specified postprocedural states: Secondary | ICD-10-CM

## 2014-12-02 DIAGNOSIS — C3490 Malignant neoplasm of unspecified part of unspecified bronchus or lung: Secondary | ICD-10-CM | POA: Insufficient documentation

## 2014-12-02 DIAGNOSIS — C3431 Malignant neoplasm of lower lobe, right bronchus or lung: Secondary | ICD-10-CM

## 2014-12-02 LAB — CBC
HEMATOCRIT: 31.2 % — AB (ref 39.0–52.0)
HEMOGLOBIN: 9.8 g/dL — AB (ref 13.0–17.0)
MCH: 30.6 pg (ref 26.0–34.0)
MCHC: 31.4 g/dL (ref 30.0–36.0)
MCV: 97.5 fL (ref 78.0–100.0)
Platelets: 264 10*3/uL (ref 150–400)
RBC: 3.2 MIL/uL — AB (ref 4.22–5.81)
RDW: 16.1 % — AB (ref 11.5–15.5)
WBC: 12.1 10*3/uL — AB (ref 4.0–10.5)

## 2014-12-02 LAB — BASIC METABOLIC PANEL WITH GFR
Anion gap: 6 (ref 5–15)
BUN: 26 mg/dL — ABNORMAL HIGH (ref 6–20)
CO2: 31 mmol/L (ref 22–32)
Calcium: 9.3 mg/dL (ref 8.9–10.3)
Chloride: 100 mmol/L — ABNORMAL LOW (ref 101–111)
Creatinine, Ser: 0.95 mg/dL (ref 0.61–1.24)
GFR calc Af Amer: >60 mL/min
GFR calc non Af Amer: >60 mL/min
Glucose, Bld: 125 mg/dL — ABNORMAL HIGH (ref 65–99)
Potassium: 4.2 mmol/L (ref 3.5–5.1)
Sodium: 137 mmol/L (ref 135–145)

## 2014-12-02 LAB — GLUCOSE, CAPILLARY
Glucose-Capillary: 111 mg/dL — ABNORMAL HIGH (ref 65–99)
Glucose-Capillary: 137 mg/dL — ABNORMAL HIGH (ref 65–99)
Glucose-Capillary: 103 mg/dL — ABNORMAL HIGH (ref 65–99)

## 2014-12-02 NOTE — Progress Notes (Signed)
Patient education done re: keeping head of bed at 30 degrees while tube feeding is in progress.  Patient was observed last night with his bed flat while tube feeding was infusing. When told that the head of his bed need to be elevated, he stated he could not sleep with his head elevated and requested tube feed be turned off. After explaining the risk of aspiration, patient decided to try sleeping with head of bed elevated. No further complaints voiced during the shift and head of bed remained at 30 degrees.

## 2014-12-02 NOTE — Progress Notes (Signed)
   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 Recently admitted for pseudomonas PNA. Readmitted 8/28 w/  Lethargy, confusion, increased SOB and  sputum production. PCCM asked to see re  CT scan findings of the chest done for PE protocol suggested a necrotizing pneumonia now seen along with a probable empyema.  SIGNIFICANT EVENTS  Right thora 8/31 150 ml frank pus Pleural fluid cx 8/31>>>GNR coccobacilli>>>   STUDIES:  CT scan 8/29: 1. No evidence of pulmonary embolus.2. Dense right lower lobe pneumonia noted. Pockets of fluid within the right lower lobe raise concern for necrotizing pneumonia.Underlying loculated peripherally enhancing small right pleural effusion seen, likely reflecting associated empyema.3. Right paratracheal mass has increased in size, measuring 5.1 x 4.3 cm, compatible with recurrent malignancy. Additional enlarged mediastinal nodes likely also reflect nodal metastases. 4. Diffuse coronary artery calcifications seen. 5. Common bile duct stent, with underlying pneumobilia..  SUBJECTIVE:   Denies chest pain, dyspnea with light activity in room, or discolored sputum.  VITAL SIGNS: Temp:  [97.3 F (36.3 C)-98 F (36.7 C)] 97.7 F (36.5 C) (09/05 0625) Pulse Rate:  [80-81] 81 (09/05 0625) Resp:  [16-20] 20 (09/05 0625) BP: (104-114)/(67-81) 104/70 mmHg (09/05 0625) SpO2:  [96 %-97 %] 96 % (09/05 0625) Room air  PHYSICAL EXAMINATION: General:  Frail , chr ill appearing, NAD lying in bed Neuro:  Awake, oriented X 3, moves all ext, sits with assistance HEENT:  Temporal wasting MMM  Cardiovascular:  Rrr, no m Lungs:  Decreased and dull right lower zone, no  accessory muscle use, clear mucus in cup, no cough. Portacath. Abdomen:  Soft, non-tender  Musculoskeletal:  Intact  Skin:  Intact    Recent Labs Lab 11/30/14 0553 12/01/14 0558 12/02/14 0545  NA 133* 137 137  K 3.3* 3.9 4.2  CL 99* 101 100*  CO2 '28 31 31  '$ BUN 20 22* 26*  CREATININE 0.75 0.90 0.95  GLUCOSE 116* 135* 125*    Recent Labs Lab 11/30/14 0553 12/01/14 0558 12/02/14 0545  HGB 9.6* 9.5* 9.8*  HCT 30.5* 30.2* 31.2*  WBC 13.2* 11.6* 12.1*  PLT 293 257 264   No results found.  ASSESSMENT / PLAN:  Necrotizing PNA w/ complicated right empyema - pseudomonas NSCLCA: stage IV on immunotherapy and palliative XRT to abd mass.  8/31 US guided thoracentesis - Yielded about 150 ml of frank green blood tinged pus. Serial F/U US w/small pocket of residual fluid as of 9/2. As mentioned before he is not a candidate for VATS, doubt that CT guided tube placement at this point would yield any further benefit.   Plan Cont current abx . Cultures neg as of 9/5 D/c vanc  Will need prolonged abx course (likely excess of 4 weeks).   Samanta Gal D [phone] 12/02/2014, 12:12 PM

## 2014-12-02 NOTE — Progress Notes (Signed)
PROGRESS NOTE  [name] OIB:[id number] DOB: 18-Aug-1945 DOA: 11/25/2014 PCP: [name], MD  HPI/Recap of past 70 hours: 69 year old male with past mental history of left-sided bronchogenic lung cancer recently discharged from the hospital on IV antibiotics for pseudomonal sepsis from pneumonia, return back on 8/29 for progressively worsening shortness of breath and found to have signs consistent with necrotizing pneumonia/possible empyema. Patient admitted to hospitalist service, started on IV antibiotics., Pulmonary consulted  Sepsis able to be ruled out. Despite necrotizing pneumonia, patient not hypoxic, breathing on room air.  Patient S/P thoracentesis fluid consistent with empyema.  Had 3 BM yesterday, diarrhea better.    Assessment/Plan: HCAP (healthcare-associated pneumonia): See below, continue antibiotics Necrotizing pneumonia/loculated pleural effusion: Ultrasound guided thoracentesis 8-31.  Continue with IV antibiotics. Cefta,  clindamycin.  Follow pleural fluid culture. Rare gram negative coccobacilli Plan to repeat Chest x ray 9-02. Persistent consolidation.  WBC mildly increase today. Follow trend.  Awaiting culture results. Awaiting for anaerobic culture that was initially growing gram negative coccobacilli Repeat chest x ray 9-06  Bronchogenic cancer of left lung: Mass seen on CT larger.  Needs to follow up with primary oncologist.   Hypertension:  Continue with  atenolol.   Protein-calorie malnutrition, severe: Receiving nutrition through PEG tube. Nutritionist consulted, tube feeding adjusted.    Hypokalemia; resolved  Diarrhea; c diff negative. Might be related to tube feeding   Code Status: DO NOT RESUSCITATE  Family Communication: Wife present  Disposition Plan: awaiting culture results.   Consultants:  Pulmonary  Palliative care  Procedures:  Plan thoracentesis 8/31  Antibiotics:  IV Tressie Ellis 8/24-present (started since previous  admission)  IV vancomycin 8/29-present   Objective: BP 115/70 mmHg  Pulse 76  Temp(Src) 97.9 F (36.6 C) (Oral)  Resp 18  Ht '5\' 8"'$  (1.727 m)  Wt 45.711 kg (100 lb 12.4 oz)  BMI 15.33 kg/m2  SpO2 99%  Intake/Output Summary (Last 24 hours) at 12/02/14 1457 Last data filed at 12/02/14 0725  Gross per 24 hour  Intake    562 ml  Output    705 ml  Net   -143 ml   Filed Weights   11/29/14 0527 11/30/14 0419 12/01/14 0758  Weight: 45.6 kg (100 lb 8.5 oz) 45.768 kg (100 lb 14.4 oz) 45.711 kg (100 lb 12.4 oz)    Exam:   General:  Emaciated, no acute distress.   Cardiovascular: Regular rate and rhythm, S1-S2  Respiratory: Decreased breath sounds throughout, limited inspiratory effort  Abdomen: Soft, nontender, nondistended, PEG tube in place  Musculoskeletal: No edema   Data Reviewed: Basic Metabolic Panel:  Recent Labs Lab 11/28/14 0410 11/29/14 0410 11/30/14 0553 12/01/14 0558 12/02/14 0545  NA 141 137 133* 137 137  K 3.0* 3.5 3.3* 3.9 4.2  CL 104 104 99* 101 100*  CO2 '29 29 28 31 31  '$ GLUCOSE 126* 129* 116* 135* 125*  BUN 21* 22* 20 22* 26*  CREATININE 0.86 0.74 0.75 0.90 0.95  CALCIUM 8.8* 8.4* 8.5* 8.7* 9.3   Liver Function Tests:  Recent Labs Lab 11/25/14 1741 11/26/14 0300 11/26/14 0458 11/27/14 1705  AST 49* 47* 46*  --   ALT 67* 64* 65*  --   ALKPHOS 398* 367* 376*  --   BILITOT 1.1 1.0 1.3*  --   PROT 5.8* 5.9* 6.1* 6.0*  ALBUMIN 2.0* 1.8* 2.0*  --    No results for input(s): LIPASE, AMYLASE in the last 168 hours. No results for input(s): AMMONIA in the last  168 hours. CBC:  Recent Labs Lab 11/25/14 1741 11/26/14 0300  11/28/14 0410 11/29/14 0410 11/30/14 0553 12/01/14 0558 12/02/14 0545  WBC 11.5* 11.0*  < > 11.3* 11.6* 13.2* 11.6* 12.1*  NEUTROABS 9.5* 9.2*  --   --   --   --   --   --   HGB 9.4* 9.2*  < > 9.9* 9.2* 9.6* 9.5* 9.8*  HCT 29.0* 28.3*  < > 30.5* 28.6* 30.5* 30.2* 31.2*  MCV 96.7 96.6  < > 96.2 96.6 96.5 96.8 97.5   PLT 244 229  < > 321 287 293 257 264  < > = values in this interval not displayed. Cardiac Enzymes:   No results for input(s): CKTOTAL, CKMB, CKMBINDEX, TROPONINI in the last 168 hours. BNP (last 3 results) No results for input(s): BNP in the last 8760 hours.  ProBNP (last 3 results) No results for input(s): PROBNP in the last 8760 hours.  CBG:  Recent Labs Lab 11/28/14 0738 11/29/14 0756 11/30/14 0722 12/01/14 0751 12/02/14 0801  GLUCAP 126* 113* 115* 136* 111*    Recent Results (from the past 240 hour(s))  Culture, blood (routine x 2)     Status: None   Collection Time: 11/25/14 10:18 PM  Result Value Ref Range Status   Specimen Description BLOOD RIGHT ANTECUBITAL  Final   Special Requests BOTTLES DRAWN AEROBIC AND ANAEROBIC 5CC  Final   Culture   Final    NO GROWTH 5 DAYS Performed at [facility]    Report Status 12/01/2014 FINAL  Final  Culture, blood (routine x 2)     Status: None   Collection Time: 11/25/14 10:20 PM  Result Value Ref Range Status   Specimen Description BLOOD LEFT ANTECUBITAL  Final   Special Requests BOTTLES DRAWN AEROBIC AND ANAEROBIC 5CC  Final   Culture   Final    NO GROWTH 5 DAYS Performed at [facility]    Report Status 12/01/2014 FINAL  Final  Anaerobic culture     Status: None (Preliminary result)   Collection Time: 11/27/14 11:52 AM  Result Value Ref Range Status   Specimen Description FLUID RIGHT PLEURAL  Final   Special Requests Immunocompromised  Final   Gram Stain   Final    ABUNDANT WBC PRESENT, PREDOMINANTLY PMN RARE GRAM NEGATIVE COCCOBACILLI    Culture   Final    HOLDING FOR POSSIBLE ANAEROBE TOO YOUNG TO READ Performed at [facility]    Report Status PENDING  Incomplete  AFB culture with smear     Status: None (Preliminary result)   Collection Time: 11/27/14 11:55 AM  Result Value Ref Range Status   Specimen Description PLEURAL RIGHT  Final   Special Requests Immunocompromised  Final    Acid Fast Smear   Final    NO ACID FAST BACILLI SEEN Performed at [facility]    Culture   Final    CULTURE WILL BE EXAMINED FOR 6 WEEKS BEFORE ISSUING A FINAL REPORT Performed at [facility]    Report Status PENDING  Incomplete  Body fluid culture     Status: None   Collection Time: 11/27/14 11:55 AM  Result Value Ref Range Status   Specimen Description FLUID RIGHT PLEURAL  Final   Special Requests Immunocompromised  Final   Gram Stain   Final    ABUNDANT WBC PRESENT, PREDOMINANTLY PMN RARE GRAM NEGATIVE COCCOBACILLI    Culture   Final    NO GROWTH 3 DAYS  Performed at [facility]    Report Status 11/30/2014 FINAL  Final  Fungus Culture with Smear     Status: None (Preliminary result)   Collection Time: 11/27/14 11:55 AM  Result Value Ref Range Status   Specimen Description PLEURAL  Final   Special Requests NONE  Final   Fungal Smear   Final    NO YEAST OR FUNGAL ELEMENTS SEEN Performed at [facility]    Culture   Final    CULTURE IN PROGRESS FOR FOUR WEEKS Performed at [facility]    Report Status PENDING  Incomplete  C difficile quick scan w PCR reflex     Status: None   Collection Time: 12/01/14  9:50 AM  Result Value Ref Range Status   C Diff antigen NEGATIVE NEGATIVE Final   C Diff toxin NEGATIVE NEGATIVE Final   C Diff interpretation Negative for toxigenic C. difficile  Final     Studies: Dg Chest 2 View  12/02/2014   CLINICAL DATA:  Weakness. Cough. Chest congestion. History of non-small-cell lung cancer.  EXAM: CHEST  2 VIEW  COMPARISON:  11/29/2014.  Chest CT dated 11/25/2014.  FINDINGS: Normal sized heart. Clear left lung. No significant change in a right superior mediastinal mass and right lower lobe opacity. Thoracic spine degenerative changes.  IMPRESSION: 1. Stable right paratracheal mass. 2. Stable right lower lobe pneumonia.   Electronically Signed   By: Claudie Revering M.D.   On: 12/02/2014 13:20    Scheduled  Meds: . atenolol  50 mg Oral Daily  . B-complex with vitamin C  1 tablet Oral Daily  . cefTAZidime (FORTAZ)  IV  2 g Intravenous Q8H  . clindamycin (CLEOCIN) IV  600 mg Intravenous 4 times per day  . feeding supplement (PRO-STAT SUGAR FREE 64)  30 mL Per Tube TID  . ferrous sulfate  325 mg Oral TID WC  . folic acid  1 mg Oral Daily  . mirabegron ER  50 mg Oral BID  . multivitamin with minerals  1 tablet Oral Daily  . OLANZapine  10 mg Oral QHS  . pantoprazole  40 mg Oral Daily  . potassium chloride  40 mEq Oral Once  . saccharomyces boulardii  250 mg Oral Daily  . sodium chloride  3 mL Intravenous Q12H  . thiamine  100 mg Oral Daily    Continuous Infusions: . feeding supplement (OSMOLITE 1.5 CAL) 1,000 mL (12/01/14 1330)     Time spent: 25 minutes  Shelvy Heckert, [facility] Pager [phone]. If 7PM-7AM, please contact night-coverage at [email or web address], password [facility] 12/02/2014, 2:57 PM  LOS: 7 days

## 2014-12-03 LAB — GLUCOSE, CAPILLARY
GLUCOSE-CAPILLARY: 123 mg/dL — AB (ref 65–99)
GLUCOSE-CAPILLARY: 123 mg/dL — AB (ref 65–99)
GLUCOSE-CAPILLARY: 128 mg/dL — AB (ref 65–99)
Glucose-Capillary: 138 mg/dL — ABNORMAL HIGH (ref 65–99)
Glucose-Capillary: 122 mg/dL — ABNORMAL HIGH (ref 65–99)

## 2014-12-03 LAB — BASIC METABOLIC PANEL
ANION GAP: 7 (ref 5–15)
BUN: 26 mg/dL — AB (ref 6–20)
CHLORIDE: 97 mmol/L — AB (ref 101–111)
CO2: 29 mmol/L (ref 22–32)
Calcium: 9 mg/dL (ref 8.9–10.3)
Creatinine, Ser: 1.02 mg/dL (ref 0.61–1.24)
GFR calc non Af Amer: >60 mL/min (ref >60)
Glucose, Bld: 144 mg/dL — ABNORMAL HIGH (ref 65–99)
POTASSIUM: 4 mmol/L (ref 3.5–5.1)
SODIUM: 133 mmol/L — AB (ref 135–145)

## 2014-12-03 MED ORDER — METRONIDAZOLE 500 MG PO TABS: 500.0000 mg | ORAL_TABLET | Freq: Three times a day (TID) | ORAL | Status: DC

## 2014-12-03 MED ORDER — HEPARIN SOD (PORK) LOCK FLUSH 100 UNIT/ML IV SOLN
500.0000 [IU] | INTRAVENOUS | Status: DC | PRN
  Filled 2014-12-03: qty 5

## 2014-12-03 MED ORDER — LOPERAMIDE HCL 2 MG PO TABS: 2.0000 mg | ORAL_TABLET | Freq: Four times a day (QID) | ORAL | Status: DC | PRN

## 2014-12-03 MED ORDER — DEXTROSE 5 % IV SOLN
2.0000 g | Freq: Two times a day (BID) | INTRAVENOUS | Status: DC
  Filled 2014-12-03: qty 2

## 2014-12-03 MED ORDER — HEPARIN SOD (PORK) LOCK FLUSH 100 UNIT/ML IV SOLN: 500.0000 [IU] | INTRAVENOUS | Status: DC

## 2014-12-03 MED ORDER — DEXTROSE 5 % IV SOLN: 2.0000 g | Freq: Two times a day (BID) | INTRAVENOUS | Status: DC

## 2014-12-03 NOTE — Discharge Summary (Signed)
Physician Discharge Summary  [name] ZCH:[id number] DOB: 30-Mar-1945 DOA: 11/25/2014  PCP: [name], MD  Admit date: 11/25/2014 Discharge date: 12/03/2014  Time spent: 35 minutes  Recommendations for Outpatient Follow-up:  Needs to follow up with pulmonary to determine further antibiotics need.   Discharge Diagnoses:    Empyema   Sepsis   Bronchogenic cancer of left lung   Hypertension   HCAP (healthcare-associated pneumonia)   Necrotizing pneumonia   Protein-calorie malnutrition, severe   Lung cancer   S/P thoracentesis   Discharge Condition: Stable  Diet recommendation: regular diet, tube feeding.   Filed Weights   11/30/14 0419 12/01/14 0758 12/03/14 0529  Weight: 45.768 kg (100 lb 14.4 oz) 45.711 kg (100 lb 12.4 oz) 43.6 kg (96 lb 1.9 oz)    History of present illness:  HPI: Ryatt Corsino is a 69 y.o. male with history of non small cell carcinoma of lung HTN recent admission for sepsis presents with failure of improvement on home IV antibiotics. Patietn had been admitted on 8/22 when he presented with fevers chills and was diagnosed with sepsis. Patient was started on antibiotics and sepsis protocol intiated. He was discharged home on 8/26 with home health and ongoing IV ceftazidime therapy. Patient was to be followed up in the oncology clinic but he presents to the ED with worsening of his symptoms. He did well on Saturday bu his wife states that he had decline on Sunday with more lethargy and confusion. He has noted increased shortness of breath and has had increased sputum production. There has been no hemoptysis noted. There are no fevers noted. No sweating noted. He has had no nausea and no vomiting noted. He has been having periodic localized sharp chest pain on the left side which is chronic for the last 6 months. Patient has had 35-40 pound wight loss since May and he had a PEG tube placed in August. Wife states there has been some drainage from the PEG site.  On presentation to the ED he had a CT scan of the chest done for PE protocol and there appears to a necrotizing pneumonia now seen along with a probable empyema. Patient has an increase in the size of the lung mass noted also.   Hospital Course:  69 year old male with past mental history of left-sided bronchogenic lung cancer recently discharged from the hospital on IV antibiotics for pseudomonal sepsis from pneumonia, return back on 8/29 for progressively worsening shortness of breath and found to have signs consistent with necrotizing pneumonia/possible empyema. Patient admitted to hospitalist service, started on IV antibiotics., Pulmonary consulted  Sepsis able to be ruled out. Despite necrotizing pneumonia, patient not hypoxic, breathing on room air. Patient S/P thoracentesis fluid consistent with empyema.  Had 3 BM yesterday, diarrhea better.    Assessment/Plan: HCAP (healthcare-associated pneumonia): See below, continue antibiotics Necrotizing pneumonia/loculated pleural effusion: Ultrasound guided thoracentesis 8-31.  Continue with IV antibiotics. Cefta, clindamycin.  Follow pleural fluid culture. Rare gram negative coccobacilli Chest x ray 9-02. Persistent consolidation.  Anaerobic culture pending.  Repeat chest x ray 9-06 stable PNA. pulmonary repeated bedside US no significant fluid for drainage,  Ok to discharge today per pulmonary. Limited options for patient care.  High risk for readmission. Patient not ready for palliative, hospice.  Discharge on IV Ceftazidime  for 4 more weeks, oral flagyl.  Needs to follow up with pulmonary for further care.   Bronchogenic cancer of left lung: Mass seen on CT larger.  Needs to follow up with primary  oncologist.   Hypertension: Continue with atenolol.   Protein-calorie malnutrition, severe: Receiving nutrition through PEG tube. Nutritionist consulted, tube feeding adjusted.   Hypokalemia; resolved  Diarrhea; c diff negative.  Might be related to tube feeding. Imodium PRN.   Procedures:  thoracentesis  Consultations:  pulmonary  Discharge Exam: Filed Vitals:   12/03/14 1309  BP: 103/70  Pulse: 73  Temp: 97.7 F (36.5 C)  Resp: 18    General: NAD Cardiovascular: S 1, S 2 RRR Respiratory: CTA  Discharge Instructions   Discharge Instructions    AMB Referral to [facility]    Complete by:  As directed   Please assign to [facility]. Currently inpatient at [facility]. Has had x6 admits in past 6 months. Please follow for post discharge care coordination. Also active with [facility]. Will update if discharge plans change. Thanks. Marthenia Rolling, MSN-Ed, [facility] [phone]  Reason for consult:  Please assign to [facility].  Expected date of contact:  1-3 days (reserved for hospital discharges)          Current Discharge Medication List    START taking these medications   Details  cefTAZidime 2 g in dextrose 5 % 50 mL Inject 2 g into the vein every 12 (twelve) hours. Qty: 112 g, Refills: 0    loperamide (IMODIUM A-D) 2 MG tablet Take 1 tablet (2 mg total) by mouth 4 (four) times daily as needed for diarrhea or loose stools. Qty: 30 tablet, Refills: 0    metroNIDAZOLE (FLAGYL) 500 MG tablet Place 1 tablet (500 mg total) into feeding tube every 8 (eight) hours. Qty: 42 tablet, Refills: 0      CONTINUE these medications which have NOT CHANGED   Details  atenolol (TENORMIN) 100 MG tablet Take 50 mg by mouth daily.     b complex vitamins tablet Take 1 tablet by mouth daily.    benzonatate (TESSALON) 100 MG capsule Take 1 capsule (100 mg total) by mouth 3 (three) times daily as needed for cough. Qty: 60 capsule, Refills: 2    chlorpheniramine-HYDROcodone (TUSSIONEX) 10-8 MG/5ML SUER Take 5 mLs by mouth every 12 (twelve) hours as needed for cough. Qty: 140 mL, Refills: 0    FeFum-FePoly-FA-B Cmp-C-Biot (INTEGRA PLUS) CAPS Take 1 capsule by mouth every morning. Qty:  30 capsule, Refills: 1   Associated Diagnoses: Bronchogenic cancer of left lung    HYDROmorphone (DILAUDID) 4 MG tablet Take 4 mg by mouth every 6 (six) hours as needed for severe pain.     metoCLOPramide (REGLAN) 10 MG tablet Take 1 tablet (10 mg total) by mouth every 8 (eight) hours as needed for nausea. Qty: 45 tablet, Refills: 0   Associated Diagnoses: Bronchogenic cancer of left lung    Multiple Vitamin (MULTIVITAMIN) tablet Take 1 tablet by mouth every morning.     MYRBETRIQ 50 MG TB24 tablet Take 50 mg by mouth 2 (two) times daily.    Associated Diagnoses: Bronchogenic cancer of left lung    Nutritional Supplements (FEEDING SUPPLEMENT, OSMOLITE 1.5 CAL,) LIQD Begin Osmolite 1.5 at 20 ml/hr continuous infusion via PEG for 12 hours overnight from 8 pm to 8 am. Increase 10 cc daily to initial goal of 95 cc/hr for 15 hours daily as tolerated.  Flush feeding tube with 240 cc free water 6 times daily. This provides 100% estimated needs. Please send feeding pump and instruct patient on how to use pump. Send TF supplies. Do not send formula. Qty: 1422 mL, Refills:  0   Associated Diagnoses: Malnutrition of moderate degree    OLANZapine (ZYPREXA) 10 MG tablet TAKE 1 TABLET(10 MG) BY MOUTH AT BEDTIME Qty: 90 tablet, Refills: 0    omeprazole (PRILOSEC) 40 MG capsule Take 1 capsule (40 mg total) by mouth every evening. Qty: 90 capsule, Refills: 3   Associated Diagnoses: Malignant neoplasm of lower lobe of lung, unspecified laterality; Malignant neoplasm of upper lobe of lung, unspecified laterality    ondansetron (ZOFRAN) 8 MG tablet Take 1 tablet (8 mg total) by mouth every 8 (eight) hours as needed for nausea or vomiting. Qty: 30 tablet, Refills: 2   Associated Diagnoses: Squamous carcinoma of lung, unspecified laterality    PRESCRIPTION MEDICATION Opdivo at [facility].    prochlorperazine (COMPAZINE) 10 MG tablet Take 1 tablet (10 mg total) by mouth every 6 (six) hours as needed for nausea or  vomiting. Qty: 30 tablet, Refills: 1   Associated Diagnoses: Squamous carcinoma of lung, unspecified laterality    saccharomyces boulardii (FLORASTOR) 250 MG capsule Take 1 capsule (250 mg total) by mouth daily. Take any generic probiotic available while on antibiotics Qty: 30 capsule, Refills: 0    triamcinolone (NASACORT ALLERGY 24HR) 55 MCG/ACT AERO nasal inhaler Place 1 spray into the nose daily as needed (congestion.).      STOP taking these medications     ceFEPIme 2 g in dextrose 5 % 50 mL      ferrous sulfate 325 (65 FE) MG tablet      Sennosides-Docusate Sodium (SENOKOT S PO)        Allergies  Allergen Reactions  . Augmentin [Amoxicillin-Pot Clavulanate] Diarrhea  . Carboplatin     Rash and itching after carbo test dose  . Levaquin [Levofloxacin] Other (See Comments)    Knee pain  . Percocet [Oxycodone-Acetaminophen] Nausea And Vomiting  . Zolpidem Tartrate Diarrhea      The results of significant diagnostics from this hospitalization (including imaging, microbiology, ancillary and laboratory) are listed below for reference.    Significant Diagnostic Studies: Dg Chest 2 View  12/02/2014   CLINICAL DATA:  Weakness. Cough. Chest congestion. History of non-small-cell lung cancer.  EXAM: CHEST  2 VIEW  COMPARISON:  11/29/2014.  Chest CT dated 11/25/2014.  FINDINGS: Normal sized heart. Clear left lung. No significant change in a right superior mediastinal mass and right lower lobe opacity. Thoracic spine degenerative changes.  IMPRESSION: 1. Stable right paratracheal mass. 2. Stable right lower lobe pneumonia.   Electronically Signed   By: Claudie Revering M.D.   On: 12/02/2014 13:20   Dg Chest 2 View  11/29/2014   CLINICAL DATA:  Patient with past history of left-sided bronchogenic lung cancer recently discharged for sepsis and pneumonia.  EXAM: CHEST  2 VIEW  COMPARISON:  Chest radiograph 11/27/2014  FINDINGS: Right anterior chest wall Port-A-Cath stable in position with tip  projecting over the superior cavoatrial junction. Stable cardiac and mediastinal contours with persistent right peritracheal mass. Persistent right mid and lower lung pulmonary consolidation with elevation of the right hemidiaphragm. The left lung is clear. Small right pleural effusion. No pneumothorax.  IMPRESSION: Stable right mid and lower lung consolidation.   Electronically Signed   By: Lovey Newcomer M.D.   On: 11/29/2014 09:13   Dg Chest 2 View  11/25/2014   CLINICAL DATA:  Pneumonia with worsening shortness of Breath. Confusion. History of non-small cell lung cancer.  EXAM: CHEST  2 VIEW  COMPARISON:  11/18/2014  FINDINGS: The cardiac silhouette, mediastinal and  hilar contours are stable. Persistent right lower lobe pneumonia. Stable underlying emphysematous changes. The power port is stable.  IMPRESSION: Persistent right lower lobe pneumonia.   Electronically Signed   By: Marijo Sanes M.D.   On: 11/25/2014 18:24   Ct Angio Chest Pe W/cm &/or Wo Cm  11/25/2014   CLINICAL DATA:  Acute onset of shortness of breath, productive cough and confusion. Initial encounter.  EXAM: CT ANGIOGRAPHY CHEST WITH CONTRAST  TECHNIQUE: Multidetector CT imaging of the chest was performed using the standard protocol during bolus administration of intravenous contrast. Multiplanar CT image reconstructions and MIPs were obtained to evaluate the vascular anatomy.  CONTRAST:  66m OMNIPAQUE IOHEXOL 350 MG/ML SOLN  COMPARISON:  Chest radiograph performed earlier today at at 5:44 p.m., and CT of the chest performed 08/06/2014  FINDINGS: There is no evidence of pulmonary embolus.  Dense right lower lobe pneumonia is noted. Pockets of fluid within the right lower lobe raise concern for necrotizing pneumonia. A loculated small right-sided pleural effusion is noted, with peripheral enhancement, likely reflecting underlying empyema.  Scarring and calcification noted at the medial aspect of the right lung, and the left lung apex.  Scattered blebs are noted in the periphery of the left lung. There is no evidence of pneumothorax.  A right paratracheal mass has increased in size, measuring 5.1 x 4.3 cm, compatible with recurrent malignancy. Additional enlarged right paratracheal and subcarinal nodes measure up to 1.5 cm in short axis. Trace pericardial fluid remains within normal limits. Diffuse coronary artery calcifications seen. Scattered calcification is noted along the aortic arch and proximal great vessels. No axillary lymphadenopathy is seen. The thyroid gland is diminutive and unremarkable in appearance. A right-sided chest port is noted ending about the distal SVC.  The visualized portions of the liver and spleen are unremarkable. A common bile duct stent is partially imaged, with underlying pneumobilia.  No acute osseous abnormalities are seen.  Review of the MIP images confirms the above findings.  IMPRESSION: 1. No evidence of pulmonary embolus. 2. Dense right lower lobe pneumonia noted. Pockets of fluid within the right lower lobe raise concern for necrotizing pneumonia. Underlying loculated peripherally enhancing small right pleural effusion seen, likely reflecting associated empyema. 3. Right paratracheal mass has increased in size, measuring 5.1 x 4.3 cm, compatible with recurrent malignancy. Additional enlarged mediastinal nodes likely also reflect nodal metastases. 4. Diffuse coronary artery calcifications seen. 5. Common bile duct stent, with underlying pneumobilia. These results were called by telephone at the time of interpretation on 11/25/2014 at 10:05 pm to Dr. EQuintella Reichert who verbally acknowledged these results.   Electronically Signed   By: JGarald BaldingM.D.   On: 11/25/2014 22:07   Ir Gastrostomy Tube  11/06/2014   CLINICAL DATA:  Metastatic lung carcinoma, weight loss, loss of appetite, dysphagia and poor nutrition. The patient presents for percutaneous gastrostomy tube placement.  EXAM: PERCUTANEOUS  GASTROSTOMY TUBE PLACEMENT  ANESTHESIA/SEDATION: 5.0 mg IV Versed; 50 mcg IV Fentanyl.  Total Moderate Sedation Time  30 minutes.  CONTRAST:  156mOMNIPAQUE IOHEXOL 300 MG/ML  SOLN  MEDICATIONS: 1 g IV vancomycin. Vancomycin was given within two hours of incision. Vancomycin was given due to an antibiotic allergy.  FLUOROSCOPY TIME:  11 minutes.  PROCEDURE: The procedure, risks, benefits, and alternatives were explained to the patient. Questions regarding the procedure were encouraged and answered. The patient understands and consents to the procedure.  A time-out was performed prior to the procedure. A 5-French catheter was then  advanced through the the patient's mouth under fluoroscopy into the esophagus and to the level of the stomach. This catheter was used to insufflate the stomach with air under fluoroscopy.  The abdominal wall was prepped with Betadine in a sterile fashion, and a sterile drape was applied covering the operative field. A sterile gown and sterile gloves were used for the procedure. Local anesthesia was provided with 1% Lidocaine.  A skin incision was made in the upper abdominal wall. Under fluoroscopy, an 18 gauge trocar needle was advanced into the stomach. Contrast injection was performed to confirm intraluminal position of the needle tip. A single T tack was then deployed in the lumen of the stomach. This was brought up to tension at the skin surface.  Over a guidewire, a 9-French sheath was advanced into the lumen of the stomach. The wire was left in place as a safety wire. A loop snare device from a percutaneous gastrostomy kit was then advanced into the stomach.  A floppy guide wire was advanced through the orogastric catheter under fluoroscopy in the stomach. The loop snare advanced through the percutaneous gastric access was used to snare the guide wire. This allowed withdrawal of the loop snare out of the patient's mouth by retraction of the orogastric catheter and wire.  A 20-French  bumper retention gastrostomy tube was looped around the snare device. It was then pulled back through the patient's mouth. The retention bumper was brought up to the anterior gastric wall. The T tack suture was cut at the skin. The exiting gastrostomy tube was cut to appropriate length and a feeding adapter applied. The catheter was injected with contrast material to confirm position and a fluoroscopic spot image saved. The tube was then flushed with saline. A dressing was applied over the gastrostomy exit site.  COMPLICATIONS: None.  FINDINGS: The stomach distended well with air allowing safe placement of the gastrostomy tube. After placement, the tip of the gastrostomy tube lies in the body of the stomach.  IMPRESSION: Percutaneous gastrostomy with placement of a 20-French bumper retention tube in the body of the stomach. This tube can be used for percutaneous feeds beginning in 24 hours after placement.   Electronically Signed   By: Aletta Edouard M.D.   On: 11/06/2014 13:01   Dg Chest Port 1 View  11/27/2014   CLINICAL DATA:  Status post right-sided thoracentesis.  EXAM: PORTABLE CHEST - 1 VIEW  COMPARISON:  11/26/2014  FINDINGS: The cardiac silhouette, mediastinal and hilar contours are stable. Persistent right lower lobe airspace opacity. No obvious pleural effusion. No postprocedural pneumothorax.  IMPRESSION: Stable chest x-ray findings. No postprocedural pneumothorax is identified.   Electronically Signed   By: Marijo Sanes M.D.   On: 11/27/2014 12:45   Dg Chest Port 1 View  11/26/2014   CLINICAL DATA:  Sepsis  EXAM: PORTABLE CHEST - 1 VIEW  COMPARISON:  11/25/2014  FINDINGS: There is a right jugular Port-A-Cath with tip in the low SVC. There is consolidation in the right base, not significantly changed. The left lung remains clear. Pulmonary vasculature is normal. Right peritracheal mass again noted, unchanged.  IMPRESSION: Unchanged right base consolidation. Unchanged right peritracheal mass.    Electronically Signed   By: Andreas Newport M.D.   On: 11/26/2014 04:22   Dg Chest Port 1 View  11/18/2014   CLINICAL DATA:  Hypotension and fever/chills.  EXAM: PORTABLE CHEST - 1 VIEW  COMPARISON:  09/20/2014  FINDINGS: Dense airspace disease in the right lower lobe.  Small right pleural effusion. No evidence of cavitation.  Clear left lung.  Normal heart size and stable aortic contours. Fullness of the right peritracheal stripe most recently evaluated by chest CT 08/06/2014.  Biliary stent pneumobilia.  Right IJ porta catheter with tip at the upper SVC.  IMPRESSION: Right lower lobe pneumonia with small effusion.   Electronically Signed   By: Monte Fantasia M.D.   On: 11/18/2014 14:02    Microbiology: Recent Results (from the past 240 hour(s))  Culture, blood (routine x 2)     Status: None   Collection Time: 11/25/14 10:18 PM  Result Value Ref Range Status   Specimen Description BLOOD RIGHT ANTECUBITAL  Final   Special Requests BOTTLES DRAWN AEROBIC AND ANAEROBIC 5CC  Final   Culture   Final    NO GROWTH 5 DAYS Performed at [facility]    Report Status 12/01/2014 FINAL  Final  Culture, blood (routine x 2)     Status: None   Collection Time: 11/25/14 10:20 PM  Result Value Ref Range Status   Specimen Description BLOOD LEFT ANTECUBITAL  Final   Special Requests BOTTLES DRAWN AEROBIC AND ANAEROBIC 5CC  Final   Culture   Final    NO GROWTH 5 DAYS Performed at [facility]    Report Status 12/01/2014 FINAL  Final  Anaerobic culture     Status: None (Preliminary result)   Collection Time: 11/27/14 11:52 AM  Result Value Ref Range Status   Specimen Description FLUID RIGHT PLEURAL  Final   Special Requests Immunocompromised  Final   Gram Stain   Final    ABUNDANT WBC PRESENT, PREDOMINANTLY PMN RARE GRAM NEGATIVE COCCOBACILLI    Culture   Final    HOLDING FOR POSSIBLE ANAEROBE Performed at [facility]    Report Status PENDING  Incomplete  AFB culture with  smear     Status: None (Preliminary result)   Collection Time: 11/27/14 11:55 AM  Result Value Ref Range Status   Specimen Description PLEURAL RIGHT  Final   Special Requests Immunocompromised  Final   Acid Fast Smear   Final    NO ACID FAST BACILLI SEEN Performed at [facility]    Culture   Final    CULTURE WILL BE EXAMINED FOR 6 WEEKS BEFORE ISSUING A FINAL REPORT Performed at [facility]    Report Status PENDING  Incomplete  Body fluid culture     Status: None   Collection Time: 11/27/14 11:55 AM  Result Value Ref Range Status   Specimen Description FLUID RIGHT PLEURAL  Final   Special Requests Immunocompromised  Final   Gram Stain   Final    ABUNDANT WBC PRESENT, PREDOMINANTLY PMN RARE GRAM NEGATIVE COCCOBACILLI    Culture   Final    NO GROWTH 3 DAYS Performed at [facility]    Report Status 11/30/2014 FINAL  Final  Fungus Culture with Smear     Status: None (Preliminary result)   Collection Time: 11/27/14 11:55 AM  Result Value Ref Range Status   Specimen Description PLEURAL  Final   Special Requests NONE  Final   Fungal Smear   Final    NO YEAST OR FUNGAL ELEMENTS SEEN Performed at [facility]    Culture   Final    CULTURE IN PROGRESS FOR FOUR WEEKS Performed at [facility]    Report Status PENDING  Incomplete  C difficile quick scan w PCR reflex  Status: None   Collection Time: 12/01/14  9:50 AM  Result Value Ref Range Status   C Diff antigen NEGATIVE NEGATIVE Final   C Diff toxin NEGATIVE NEGATIVE Final   C Diff interpretation Negative for toxigenic C. difficile  Final     Labs: Basic Metabolic Panel:  Recent Labs Lab 11/29/14 0410 11/30/14 0553 12/01/14 0558 12/02/14 0545 12/03/14 0543  NA 137 133* 137 137 133*  K 3.5 3.3* 3.9 4.2 4.0  CL 104 99* 101 100* 97*  CO2 _0 GLUCOSE 129* 116* 135* 125* 144*  BUN 22* 20 22* 26* 26*  CREATININE 0.74 0.75 0.90 0.95 1.02  CALCIUM 8.4* 8.5* 8.7*  9.3 9.0   Liver Function Tests:  Recent Labs Lab 11/27/14 1705  PROT 6.0*   No results for input(s): LIPASE, AMYLASE in the last 168 hours. No results for input(s): AMMONIA in the last 168 hours. CBC:  Recent Labs Lab 11/28/14 0410 11/29/14 0410 11/30/14 0553 12/01/14 0558 12/02/14 0545  WBC 11.3* 11.6* 13.2* 11.6* 12.1*  HGB 9.9* 9.2* 9.6* 9.5* 9.8*  HCT 30.5* 28.6* 30.5* 30.2* 31.2*  MCV 96.2 96.6 96.5 96.8 97.5  PLT 321 287 293 257 264   Cardiac Enzymes: No results for input(s): CKTOTAL, CKMB, CKMBINDEX, TROPONINI in the last 168 hours. BNP: BNP (last 3 results) No results for input(s): BNP in the last 8760 hours.  ProBNP (last 3 results) No results for input(s): PROBNP in the last 8760 hours.  CBG:  Recent Labs Lab 12/02/14 2001 12/03/14 0023 12/03/14 0402 12/03/14 0823 12/03/14 1145  GLUCAP 103* 128* 123* 138* 122*       Signed:  Regalado, Belkys A  [facility] 12/03/2014, 2:02 PM

## 2014-12-03 NOTE — Progress Notes (Signed)
ANTIBIOTIC CONSULT NOTE - FOLLOW UP  Pharmacy Consult for Ceftazidime, may renally adjust antibiotics (Clindamycin) Indication: pneumonia  Allergies  Allergen Reactions  . Augmentin [Amoxicillin-Pot Clavulanate] Diarrhea  . Carboplatin     Rash and itching after carbo test dose  . Levaquin [Levofloxacin] Other (See Comments)    Knee pain  . Percocet [Oxycodone-Acetaminophen] Nausea And Vomiting  . Zolpidem Tartrate Diarrhea    Patient Measurements: Height: '5\' 8"'$  (172.7 cm) Weight: 96 lb 1.9 oz (43.6 kg) IBW/kg (Calculated) : 68.4  Vital Signs: Temp: 97.6 F (36.4 C) (09/06 0529) Temp Source: Oral (09/06 0529) BP: 103/65 mmHg (09/06 0529) Pulse Rate: 82 (09/06 0529) Intake/Output from previous day: 09/05 0701 - 09/06 0700 In: -  Out: 400 [Urine:400]  Labs:  Recent Labs  12/01/14 0558 12/02/14 0545 12/03/14 0543  WBC 11.6* 12.1*  --   HGB 9.5* 9.8*  --   PLT 257 264  --   CREATININE 0.90 0.95 1.02   Estimated Creatinine Clearance: 42.2 mL/min (by C-G formula based on Cr of 1.02). No results for input(s): VANCOTROUGH, VANCOPEAK, VANCORANDOM, GENTTROUGH, GENTPEAK, GENTRANDOM, TOBRATROUGH, TOBRAPEAK, TOBRARND, AMIKACINPEAK, AMIKACINTROU, AMIKACIN in the last 72 hours.   Assessment: 65 yoM admitted on 8/29 with hx non-small cell Lung Ca d/c'd from hospital 8/26 with PNA (Pseudomonas in sputum culture). Now with increased SOB with mobility and cough with green sputum. CXR post thoracentesis shows persistent RLL opacity. Thick purulent pleural fluid c/w empyema - not a surgical or chest tube candidate and will need long-term abx.  Patient currently on Ceftazidime and Clindamycin for nectrotizing PNA.  8/29 >> cefepime >> x1 ED 8/29 >> vancomycin >> 9/2 8/30 >> ceftazidime >>  8/30 >> clindamycin >>  8/29 blood: NGF 8/29 sputum: ordered 8/29 Urine Strep Ag: negative 8/29 Urine Legionella Ag: negative 8/30 HIV: NR 8/31 Pleural fluid (rt, thoracentesis): rare  gram neg coccobacilli on gram stain, final cx NGF 8/31 Pleural fluid, anaerobic culture: rare gram negative coccobacilli, holding for possible anaerobe 8/31 Pleural fluid, fungus culture: no yeast or fungal elements seen, cx in progress x 4 wks 8/31 Pleural fluid, AFB culture: no AFB seen, cx in progress x 6 wks 9/4: Cdiff antigen + toxin: negative  Today, 12/03/2014:  Afebrile  WBC elevated but stable  SCr slowly trending up with CrCl now 42 ml/min  Goal of Therapy:  Appropriate abx dosing, eradication of infection.   Plan:   Continue clindamycin '600mg'$  IV q6h  Change ceftazidime from 2g q8 to 2g q12 for CrCl 30-50 ml/min  Follow up renal fxn, culture results, and clinical course.   Adrian Saran, PharmD, BCPS Pager [phone] 12/03/2014 8:16 AM

## 2014-12-03 NOTE — Progress Notes (Signed)
     Right pleural space assessment.  No significant pocket of fluid.     Noe Gens, NP-C [facility] Pgr: [phone] or if no answer [phone] 12/03/2014, 3:19 PM

## 2014-12-03 NOTE — Care Management Note (Signed)
Case Management Note  Patient Details  Name: [name] MRN: [id number] Date of Birth: [birth year]  Subjective/Objective:  Awaiting pleural fluid cx.IV abx. Noted long term iv abx.[facility] rep Kristen aware & following. Await [place] orders if needed.                 Action/Plan:d/c plan home.   Expected Discharge Date:   (unknown)               Expected Discharge Plan:  [place]  In-House Referral:     Discharge planning Services  CM Consult  Post Acute Care Choice:  Resumption of Svcs/PTA Provider Choice offered to:     DME Arranged:    DME Agency:     HH Arranged:    [place] Agency:  [place]  Status of Service:  In process, will continue to follow  Medicare Important Message Given:  Yes-second notification given Date Medicare IM Given:    Medicare IM give by:    Date Additional Medicare IM Given:    Additional Medicare Important Message give by:     If discussed at [place] of Stay Meetings, dates discussed:  12/03/14  Additional Comments:  Dessa Phi, RN 12/03/2014, 11:33 AM

## 2014-12-03 NOTE — Progress Notes (Signed)
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 Recently admitted for pseudomonas PNA. Readmitted 8/28 w/  Lethargy, confusion, increased SOB and  sputum production. PCCM asked to see re  CT scan findings of the chest done for PE protocol suggested a necrotizing pneumonia now seen along with a probable empyema.  SIGNIFICANT EVENTS  Right thora 8/31 150 ml frank pus Pleural fluid cx 8/31>>>GNR coccobacilli>>>   STUDIES:  CT scan 8/29: 1. No evidence of pulmonary embolus.2. Dense right lower lobe pneumonia noted. Pockets of fluid within the right lower lobe raise concern for necrotizing pneumonia.Underlying loculated peripherally enhancing small right pleural effusion seen, likely reflecting associated empyema.3. Right paratracheal mass has increased in size, measuring 5.1 x4.3 cm, compatible with recurrent malignancy. Additional enlarged mediastinal nodes likely also reflect nodal metastases. 4. Diffuse coronary artery calcifications seen. 5. Common bile duct stent, with underlying pneumobilia..  SUBJECTIVE:  Afebrile Denies CP  sputum clearing  VITAL SIGNS: Temp:  [97.6 F (36.4 C)-97.9 F (36.6 C)] 97.6 F (36.4 C) (09/06 0529) Pulse Rate:  [75-82] 82 (09/06 0529) Resp:  [16-18] 16 (09/06 0529) BP: (100-115)/(59-70) 103/65 mmHg (09/06 0529) SpO2:  [97 %-99 %] 97 % (09/06 0529) Weight:  [43.6 kg (96 lb 1.9 oz)] 43.6 kg (96 lb 1.9 oz) (09/06 0529) Room air  PHYSICAL EXAMINATION: General:  Frail , chr ill appearing, NAD lying in bed Neuro:  Awake, oriented X 3, moves all ext, sits with assistance HEENT:  Temporal wasting MMM  Cardiovascular:  Rrr, no m Lungs:  Decreased and  dull right lower zone, no accessory muscle use, clear mucus in cup, no cough. Portacath. Abdomen:  Soft, non-tender  Musculoskeletal:  Intact  Skin:  Intact    Recent Labs Lab 12/01/14 0558 12/02/14 0545 12/03/14 0543  NA 137 137 133*  K 3.9 4.2 4.0  CL 101 100* 97*  CO2 '31 31 29  '$ BUN 22* 26* 26*  CREATININE 0.90 0.95 1.02  GLUCOSE 135* 125* 144*    Recent Labs Lab 11/30/14 0553 12/01/14 0558 12/02/14 0545  HGB 9.6* 9.5* 9.8*  HCT 30.5* 30.2* 31.2*  WBC 13.2* 11.6* 12.1*  PLT 293 257 264   Dg Chest 2 View  12/02/2014   CLINICAL DATA:  Weakness. Cough. Chest congestion. History of non-small-cell lung cancer.  EXAM: CHEST  2 VIEW  COMPARISON:  11/29/2014.  Chest CT dated 11/25/2014.  FINDINGS: Normal sized heart. Clear left lung. No significant change in a right superior mediastinal mass and right lower lobe opacity. Thoracic spine degenerative changes.  IMPRESSION: 1. Stable right paratracheal mass. 2. Stable right lower lobe pneumonia.   Electronically Signed   By: Claudie Revering M.D.   On: 12/02/2014 13:20    ASSESSMENT / PLAN:  Necrotizing PNA w/ complicated right empyema - pseudomonas NSCLCA: stage IV on immunotherapy and palliative XRT to abd mass.  8/31 US guided thoracentesis - Yielded about 150 ml of frank green blood tinged pus. Serial F/U US w/small pocket of residual fluid as of 9/2. As mentioned before he is not a candidate for VATS, does not wantt CT guided tube placement ( given his bad experience with his drain) benefit.  Plan Unfortunately we may be at the end of the road here for [facility]. I have discussed with Dr. Burnett Sheng is not a candidate for any more chemotherapy. He seems to have disease progression on immunotherapy. He is not ready for hospice care yet-but does understand that that's where he is headed.  Will need prolonged abx course (likely excess of 4 weeks) -would continue ceftazidime for Pseudomonas for 4 weeks and add Flagyl for potential anaerobes  for 2 weeks. We will repeat ultrasound at the bedside today to look for drainable collection-if none then, he can be discharged  [facility] [phone]. [phone] 12/03/2014, 1:00 PM

## 2014-12-03 NOTE — Progress Notes (Signed)
Nutrition Follow-up  DOCUMENTATION CODES:   Severe malnutrition in context of chronic illness, Underweight  INTERVENTION:   Continue Osmolite 1.5 to 40 ml/hr.  30 ml Prostat to TID 120 ml free water Q2hrs (free water for 14 hours/day) via PEG. Provides 1740 kcal, 105g protein and 1572 ml of H2O.  -Continue PO diet with Carnation Instant Breakfast BID, each supplement provides 220 kcal and 13 grams of protein - RD will continue to monitor for needs  NUTRITION DIAGNOSIS:   Malnutrition related to dysphagia, cancer and cancer related treatments, chronic illness as evidenced by percent weight loss, severe depletion of body fat, severe depletion of muscle mass.  Ongoing.  GOAL:   Patient will meet greater than or equal to 90% of their needs  Meeting.  MONITOR:   Labs, Weight trends, TF tolerance, Skin, I & O's, Supplement acceptance  ASSESSMENT:   69 year old male with past mental history of left-sided bronchogenic lung cancer recently discharged from the hospital on IV antibiotics for pseudomonal sepsis from pneumonia, return back on 8/29 for progressively worsening shortness of breath and found to have signs consistent with necrotizing pneumonia/possible empyema.  Pt in room with wife at bedside. Pt states that he is feeling nauseous. Pt states his loose stools have improved. Pt would like to continue tube feeding at 40 ml/hr. Pt reports receiving Prostat PO. Recommend administering Prostat through tube at pt unable to consume all of Prostat PO.  Per interdisciplinary rounds, pt may discharge today.  Labs reviewed: CBGs: 123-138 Low Na Elevated BUN  Diet Order:  Diet regular Room service appropriate?: Yes; Fluid consistency:: Thin  Skin:  Reviewed, no issues  Last BM:  9/4  Height:   Ht Readings from Last 1 Encounters:  11/26/14 '5\' 8"'$  (1.727 m)    Weight:   Wt Readings from Last 1 Encounters:  12/03/14 96 lb 1.9 oz (43.6 kg)    Ideal Body Weight:  70  kg  BMI:  Body mass index is 14.62 kg/(m^2).  Estimated Nutritional Needs:   Kcal:  1800-2000  Protein:  95-105g  Fluid:  1.9L/day  EDUCATION NEEDS:   No education needs identified at this time  Clayton Bibles, MS, RD, LDN Pager: [phone] After Hours Pager: [phone]

## 2014-12-04 ENCOUNTER — Telehealth: Payer: Self-pay | Admitting: *Deleted

## 2014-12-04 ENCOUNTER — Telehealth: Payer: Self-pay | Admitting: Pulmonary Disease

## 2014-12-04 LAB — ANAEROBIC CULTURE

## 2014-12-04 LAB — PH, BODY FLUID: PH, BODY FLUID: 6.9

## 2014-12-04 NOTE — Telephone Encounter (Signed)
Patient wife would like to know if patient needs to make his 12/06/14 treatment appointment. She thought from the last conversation, wanted to postpone this treatment. Please advise. Message sent to MD [facility].

## 2014-12-04 NOTE — Telephone Encounter (Signed)
Pt recently discharged form Hospital, per MD called pt unable to reach lmovm :  Appt for 9/9 labs/treatment cancelled. Appt will be r/s for 9/16 if pt would like to continue with treatment. POF to scheduling

## 2014-12-04 NOTE — Telephone Encounter (Signed)
Mixed anaerobic flora He was discharged on Flagyl

## 2014-12-04 NOTE — Telephone Encounter (Signed)
Spoke with Rodman Key with Micro Lab at [facility].  States [place] faxed over final results on anaerobic culture today.  These are also in [place].  Will forward to Dr. Elsworth Soho as Juluis Rainier that anaerobic culture is final.

## 2014-12-05 ENCOUNTER — Telehealth: Payer: Self-pay | Admitting: Internal Medicine

## 2014-12-05 ENCOUNTER — Encounter: Payer: Self-pay | Admitting: *Deleted

## 2014-12-05 ENCOUNTER — Other Ambulatory Visit: Payer: Self-pay | Admitting: *Deleted

## 2014-12-05 NOTE — Telephone Encounter (Signed)
s.w. pt and advised on changed appt.Marland KitchenMarland KitchenMarland KitchenMarland Kitchenpt ok and aware

## 2014-12-05 NOTE — Telephone Encounter (Signed)
I told [name] Dr [name] does not need labs drawn at home.

## 2014-12-05 NOTE — Patient Outreach (Signed)
12/05/14- Telephone call to patient for transition of care week 1, spoke with pt who prefers RN CM speak with his wife Adela Lank,  Per [place], [place] is seeing pt and pt was just approved for vestibular therapy for vertigo.  Issues noted from today's visit are as follows: 1. Blood pressure 85/53, heart rate 87- RN CM faxed note to primary MD Dr. Olen Pel reporting blood pressure, dizziness, also called and reported to Dr. Olen Pel office, spoke with Lattie Haw, pt continues taking 1/2 tablet atenolol daily, reported also to home health RN Juliane Poot who will be seeing pt tomorrow for visit and will be monitoring blood pressure and collaborating with MD. 2. Pt unable to take some of his medications at times due to unable to swallow.  RN CM talked with RN Juliane Poot about wife crushing more of the medications (she is already crushing some) and that wife expressed concern with flushing after the medications, this would be too much fluid for pt and he would get nauseous.  Otila Kluver states she will talk with pharmacist at home health about exactly which medications can be crushed and what can be mixed together and she will let patient's wife know. 3.  Pt not taking in enough calories and protein, wife states pt was instructed awhile back to add in protein supplement but wife does not know which type of protein and how much pt needs for daily intake, wife states she has good rapport already with dietician at [facility] and will call to talk with them about this.   Wife is also going to call and make appointment with primary MD for hospital follow up (RN CM offered to assist with these phone calls, wife states she is able to complete without assistance).  Weight 96 pounds per wife  Outpatient Encounter Prescriptions as of 12/05/2014  Medication Sig Note  . atenolol (TENORMIN) 100 MG tablet Take 50 mg by mouth daily.    . benzonatate (TESSALON) 100 MG capsule Take 1 capsule (100 mg total) by mouth 3 (three)  times daily as needed for cough.   . cefTAZidime 2 g in dextrose 5 % 50 mL Inject 2 g into the vein every 12 (twelve) hours.   . chlorpheniramine-HYDROcodone (TUSSIONEX) 10-8 MG/5ML SUER Take 5 mLs by mouth every 12 (twelve) hours as needed for cough.   . FeFum-FePoly-FA-B Cmp-C-Biot (INTEGRA PLUS) CAPS Take 1 capsule by mouth every morning.   Marland Kitchen HYDROmorphone (DILAUDID) 4 MG tablet Take 4 mg by mouth every 6 (six) hours as needed for severe pain.    Marland Kitchen loperamide (IMODIUM A-D) 2 MG tablet Take 1 tablet (2 mg total) by mouth 4 (four) times daily as needed for diarrhea or loose stools.   . metoCLOPramide (REGLAN) 10 MG tablet Take 1 tablet (10 mg total) by mouth every 8 (eight) hours as needed for nausea.   . metroNIDAZOLE (FLAGYL) 500 MG tablet Place 1 tablet (500 mg total) into feeding tube every 8 (eight) hours.   . Multiple Vitamin (MULTIVITAMIN) tablet Take 1 tablet by mouth every morning.    Marland Kitchen MYRBETRIQ 50 MG TB24 tablet Take 50 mg by mouth 2 (two) times daily.    . Nutritional Supplements (FEEDING SUPPLEMENT, OSMOLITE 1.5 CAL,) LIQD Begin Osmolite 1.5 at 20 ml/hr continuous infusion via PEG for 12 hours overnight from 8 pm to 8 am. Increase 10 cc daily to initial goal of 95 cc/hr for 15 hours daily as tolerated.  Flush feeding tube with 240 cc free  water 6 times daily. This provides 100% estimated needs. Please send feeding pump and instruct patient on how to use pump. Send TF supplies. Do not send formula. 11/25/2014: 44m/hr, continuous, per wife.    .Marland KitchenOLANZapine (ZYPREXA) 10 MG tablet TAKE 1 TABLET(10 MG) BY MOUTH AT BEDTIME   . omeprazole (PRILOSEC) 40 MG capsule Take 1 capsule (40 mg total) by mouth every evening.   . ondansetron (ZOFRAN) 8 MG tablet Take 1 tablet (8 mg total) by mouth every 8 (eight) hours as needed for nausea or vomiting.   . prochlorperazine (COMPAZINE) 10 MG tablet Take 1 tablet (10 mg total) by mouth every 6 (six) hours as needed for nausea or vomiting.   . saccharomyces  boulardii (FLORASTOR) 250 MG capsule Take 1 capsule (250 mg total) by mouth daily. Take any generic probiotic available while on antibiotics   . triamcinolone (NASACORT ALLERGY 24HR) 55 MCG/ACT AERO nasal inhaler Place 1 spray into the nose daily as needed (congestion.).   .Marland Kitchenb complex vitamins tablet Take 1 tablet by mouth daily.   .Marland KitchenPRESCRIPTION MEDICATION [facility]    Facility-Administered Encounter Medications as of 12/05/2014  Medication  . sodium chloride 0.9 % injection 10 mL   THN CM Care Plan Problem One        Most Recent Value   Care Plan Problem One  Altered nutrition: less than body requirements, related to poor intake/ disease process   Role Documenting the Problem One  Care Management Coordinator   Care Plan for Problem One  Active   THN Long Term Goal (31-90 days)  pt will maintain current weight within 60 days   THN Long Term Goal Start Date  12/05/14   Interventions for Problem One Long Term Goal  RN CM completed nutritional assessment, reviewed regimen of osmolite with feeding tube and importance of adequate fluids.   THN CM Short Term Goal #1 (0-30 days)  [place] days.   THN CM Short Term Goal #1 Start Date  12/05/14   Interventions for Short Term Goal #1  RN CM instructed wife to speak with dietician with home health (as wife has talked with her before) about the type of protein powder and amount pt should be consuming.   THN CM Short Term Goal #2 (0-30 days)  wife will make appointment with primary MD within 7 days   THN CM Short Term Goal #2 Start Date  12/05/14   Interventions for Short Term Goal #2  RN CM instructed wife to schedule post hospital follow up appointment with primary MD within this next week, wife states she will complete.      PLAN Continue weekly transition of care calls Assess blood pressure, weight, nutritional intake Collaborate with home health as  needed  JJacqlyn LarsenRPrairieville Family Hospital BMonmouthCoordinator [phone]

## 2014-12-06 ENCOUNTER — Other Ambulatory Visit: Payer: Medicare Other

## 2014-12-06 ENCOUNTER — Encounter: Payer: Medicare Other | Admitting: Nutrition

## 2014-12-06 ENCOUNTER — Ambulatory Visit: Payer: Medicare Other

## 2014-12-11 ENCOUNTER — Telehealth: Payer: Self-pay | Admitting: *Deleted

## 2014-12-11 NOTE — Telephone Encounter (Signed)
VM message received from pt's wife. She called to let Dr. [name] know that pt is doing better since discharged from hospital.  She states WBC is returning to normal, and that pt is not having any pain at all.  She states that she believes his infection is clearing up, though he remains weak and tired post hospitalization.   He will be at [facility] on 12/13/15 for labs and chemo.

## 2014-12-12 ENCOUNTER — Other Ambulatory Visit: Payer: Self-pay | Admitting: *Deleted

## 2014-12-12 ENCOUNTER — Encounter: Payer: Self-pay | Admitting: *Deleted

## 2014-12-12 NOTE — Patient Outreach (Signed)
12/11/14-telephone call for transition of care week 2, permission given by pt to speak with wife,  Per wife Adela Lank, pt saw primary MD Dr. Olen Pel and bloodwork is improved with white blood cells increased, pt has " a little more energy".  Pt to see Dr. [name] with oncology tomorrow and wife states " we will talk about his prognosis and go from there"  Pt talked with home health RN about crushing medications and flushing which pt is now doing and taking in more fluids which has helped with blood pressure which is 123/77 today, blood pressure medications being held per Dr. Olen Pel.  Wife did speak with dietician about protein powder and will discuss with oncologist tomorrow and get prescription.  Pt has not weighed in past few days but intends on weighing today, pt is consuming more calories now with tube feeding.  Wife does not feel home visit is needed as home health is seeing pt several times weekly, no other needs, issues reported.  THN CM Care Plan Problem One        Most Recent Value   Care Plan Problem One  Altered nutrition: less than body requirements, related to poor intake/ disease process   Role Documenting the Problem One  Care Management [place] for Problem One  Active   THN Long Term Goal (31-90 days)  pt will maintain current weight within 60 days   THN Long Term Goal Start Date  12/05/14   Interventions for Problem One Long Term Goal  RN CM reiterated need for adequate fluids to maintain blood pressure, prevent dehydration, importance of adequate calories and protein.   THN CM Short Term Goal #1 (0-30 days)  wife will talk with dietician at [facility] about protein needs of client within 7 days.   THN CM Short Term Goal #1 Start Date  12/05/14   [facility] CM Short Term Goal #1 Met Date  12/12/14 [wife talked with dietician]   THN CM Short Term Goal #2 (0-30 days)  wife will make appointment with primary MD within 7 days   THN CM Short Term Goal #2 Start Date  12/05/14   [facility] CM Short Term Goal #2 Met Date  12/12/14 [pt saw primary care MD 12/11/14]     Fall Risk  12/12/2014 02/12/2014 11/13/2013  Falls in the past year? No No No  Risk for fall due to : Impaired balance/gait;Medication side effect - -    PLAN Continue weekly transition of care calls.  [name] [facility], [place] Coordinator [phone]

## 2014-12-13 ENCOUNTER — Other Ambulatory Visit: Payer: Self-pay | Admitting: Medical Oncology

## 2014-12-13 ENCOUNTER — Ambulatory Visit: Payer: Medicare Other

## 2014-12-13 ENCOUNTER — Telehealth: Payer: Self-pay | Admitting: Medical Oncology

## 2014-12-13 ENCOUNTER — Ambulatory Visit (HOSPITAL_BASED_OUTPATIENT_CLINIC_OR_DEPARTMENT_OTHER): Payer: Medicare Other | Admitting: Internal Medicine

## 2014-12-13 ENCOUNTER — Other Ambulatory Visit (HOSPITAL_BASED_OUTPATIENT_CLINIC_OR_DEPARTMENT_OTHER): Payer: Medicare Other

## 2014-12-13 ENCOUNTER — Ambulatory Visit (HOSPITAL_BASED_OUTPATIENT_CLINIC_OR_DEPARTMENT_OTHER): Payer: Medicare Other

## 2014-12-13 ENCOUNTER — Other Ambulatory Visit: Payer: Self-pay | Admitting: Physician Assistant

## 2014-12-13 ENCOUNTER — Telehealth: Payer: Self-pay | Admitting: Internal Medicine

## 2014-12-13 VITALS — BP 115/83 | HR 98 | Temp 98.6°F | Resp 18

## 2014-12-13 DIAGNOSIS — C3492 Malignant neoplasm of unspecified part of left bronchus or lung: Secondary | ICD-10-CM | POA: Diagnosis present

## 2014-12-13 DIAGNOSIS — Z452 Encounter for adjustment and management of vascular access device: Secondary | ICD-10-CM

## 2014-12-13 DIAGNOSIS — Z95828 Presence of other vascular implants and grafts: Secondary | ICD-10-CM

## 2014-12-13 DIAGNOSIS — Z79899 Other long term (current) drug therapy: Secondary | ICD-10-CM

## 2014-12-13 DIAGNOSIS — C772 Secondary and unspecified malignant neoplasm of intra-abdominal lymph nodes: Secondary | ICD-10-CM | POA: Diagnosis not present

## 2014-12-13 DIAGNOSIS — R5382 Chronic fatigue, unspecified: Secondary | ICD-10-CM

## 2014-12-13 LAB — CBC WITH DIFFERENTIAL/PLATELET
BASO%: 0.6 % (ref 0.0–2.0)
Basophils Absolute: 0.1 10*3/uL (ref 0.0–0.1)
EOS ABS: 0.2 10*3/uL (ref 0.0–0.5)
EOS%: 1.5 % (ref 0.0–7.0)
HCT: 32.5 % — ABNORMAL LOW (ref 38.4–49.9)
HEMOGLOBIN: 10.7 g/dL — AB (ref 13.0–17.1)
LYMPH%: 9.1 % — ABNORMAL LOW (ref 14.0–49.0)
MCH: 31.6 pg (ref 27.2–33.4)
MCHC: 32.9 g/dL (ref 32.0–36.0)
MCV: 96 fL (ref 79.3–98.0)
MONO#: 1.8 10*3/uL — ABNORMAL HIGH (ref 0.1–0.9)
MONO%: 14.9 % — AB (ref 0.0–14.0)
NEUT%: 73.9 % (ref 39.0–75.0)
NEUTROS ABS: 9 10*3/uL — AB (ref 1.5–6.5)
Platelets: 235 10*3/uL (ref 140–400)
RBC: 3.39 10*6/uL — AB (ref 4.20–5.82)
RDW: 17.5 % — AB (ref 11.0–14.6)
WBC: 12.1 10*3/uL — AB (ref 4.0–10.3)
lymph#: 1.1 10*3/uL (ref 0.9–3.3)

## 2014-12-13 LAB — COMPREHENSIVE METABOLIC PANEL (CC13)
ALBUMIN: 2.3 g/dL — AB (ref 3.5–5.0)
ALK PHOS: 345 U/L — AB (ref 40–150)
ALT: 29 U/L (ref 0–55)
AST: 39 U/L — AB (ref 5–34)
Anion Gap: 7 mEq/L (ref 3–11)
BILIRUBIN TOTAL: 0.52 mg/dL (ref 0.20–1.20)
BUN: 20.2 mg/dL (ref 7.0–26.0)
CO2: 31 meq/L — AB (ref 22–29)
CREATININE: 0.8 mg/dL (ref 0.7–1.3)
Calcium: 9.9 mg/dL (ref 8.4–10.4)
Chloride: 98 mEq/L (ref 98–109)
GLUCOSE: 98 mg/dL (ref 70–140)
Potassium: 4.7 mEq/L (ref 3.5–5.1)
SODIUM: 135 meq/L — AB (ref 136–145)
TOTAL PROTEIN: 7.4 g/dL (ref 6.4–8.3)

## 2014-12-13 LAB — TSH CHCC: TSH: 2.456 m[IU]/L (ref 0.320–4.118)

## 2014-12-13 MED ORDER — HYDROCODONE-ACETAMINOPHEN 7.5-325 MG/15ML PO SOLN
15.0000 mL | Freq: Once | ORAL | Status: AC
  Administered 2014-12-13: 15 mL

## 2014-12-13 MED ORDER — HYDROCODONE-ACETAMINOPHEN 7.5-325 MG/15ML PO SOLN
ORAL | Status: AC
  Filled 2014-12-13: qty 30

## 2014-12-13 MED ORDER — SODIUM CHLORIDE 0.9 % IJ SOLN
10.0000 mL | INTRAMUSCULAR | Status: DC | PRN
  Administered 2014-12-13: 10 mL via INTRAVENOUS
  Filled 2014-12-13: qty 10

## 2014-12-13 NOTE — Progress Notes (Signed)
[place] [phone]   Fax:([phone]) [place], MD [address] [place] [address]  DIAGNOSIS AND DIAGNOSIS: Metastatic non-small cell lung cancer adenocarcinoma diagnosed in September 2010   PRIOR THERAPY:  #1 status post 6 cycles of systemic chemotherapy with carboplatin, Alimta and Avastin given every 3 weeks last dose given 04/23/2009 with disease stabilization.  #2 status post palliative radiotherapy to the mediastinum under the care of Dr. Pablo Ledger. The patient received a total dose of 3000 cGY and 12 fractions completed 01/21/2009.  #3 Maintenance chemotherapy with Alimta at 500 mg per meter squared and Avastin at 15 mg per kilogram given every 3 weeks status post 35 cycles.  #4 Maintenance chemotherapy with Alimta 500 mg/M2 and Avastin 15 mg/kg every 4 weeks,status post 17 cycles, discontinued secondary to disease progression.  #5 Systemic chemotherapy with carboplatin for AUC of 5, Alimta 500 mg/M2 and Avastin 15 mg/kg every 3 weeks, status post 3 cycles, last cycle was given 12/28/2012. Carboplatin was discontinued starting cycle #2 secondary to hypersensitivity reaction. #6 Maintenance chemotherapy again with Alimta 500 mg/M2 and Avastin 15 mg/kg every 3 weeks, first cycle 01/23/2013. Status post 6 cycles. #7 palliative radiotherapy to the large abdominal mass under the care of Dr. Pablo Ledger. #8  Immunotherapy with Nivolumab 3 MG/KG every 2 weeks. First dose 10/04/2014. Status post 3 cycles discontinued secondary to disease progression.  CURRENT THERAPY: Palliative care and hospice.  CODE STATUS: No CODE BLUE  INTERVAL HISTORY: Major Santerre 69 y.o. male returns to the clinic today for follow-up visit accompanied by his wife. The patient continues to complain of increasing fatigue and weakness. He also has significant dysphagia and completely dependent on nutrition through the PEG feeding  tube and nutritional supplements. He was recently discharged from [facility] after being treated for right sided necrotizing pneumonia and empyema. He completed a course of anti-biotics with clindamycin and Ceftin. CT scan of the chest performed during his hospitalization showed significant evidence for disease progression especially on the mediastinal lymph nodes. The patient continues to have weight loss was no night sweats. He has no chest pain but continues to have shortness of breath at baseline and increased with exertion and cough. No significant nausea or vomiting. He is here today for evaluation and discussion of his treatment options.  MEDICAL HISTORY: Past Medical History  Diagnosis Date  . Hypertension   . Arthritis RIGHT SHOULDER  . Immature cataract BILATERAL  . Non-small cell lung cancer DX SEPT 2010  W/ CHEMORADIATION AT THAT TIME -- NOW  W/ METS--  CURRENTLY ON MAINTENANCE CHEMO TX  EVERY 30 DAYS    ONCOLOGIST- DR [facility]  . Acute meniscal tear of knee RIGHT KNEE  . BPH (benign prostatic hypertrophy)   . Dizziness and giddiness 04/23/2014  . Tremor 04/23/2014    Right hand  . HOH (hard of hearing)     bilateral hearing aids  . Radiation 01/08/09-01/21/09    Mediastinum 30 Gy x 12 fractions  . Radiation 08/19/14-09/04/14    Palliative RT Porta hepatic LN 30 Gy    ALLERGIES:  is allergic to augmentin; carboplatin; levaquin; percocet; and zolpidem tartrate.  MEDICATIONS:  Current Outpatient Prescriptions  Medication Sig Dispense Refill  . atenolol (TENORMIN) 100 MG tablet Take 50 mg by mouth daily.     Marland Kitchen b complex vitamins tablet Take 1 tablet by mouth daily.    . benzonatate (TESSALON) 100  MG capsule Take 1 capsule (100 mg total) by mouth 3 (three) times daily as needed for cough. 60 capsule 2  . cefTAZidime 2 g in dextrose 5 % 50 mL Inject 2 g into the vein every 12 (twelve) hours. 112 g 0  . chlorpheniramine-HYDROcodone (TUSSIONEX) 10-8 MG/5ML SUER Take 5 mLs by  mouth every 12 (twelve) hours as needed for cough. 140 mL 0  . FeFum-FePoly-FA-B Cmp-C-Biot (INTEGRA PLUS) CAPS Take 1 capsule by mouth every morning. 30 capsule 1  . HYDROmorphone (DILAUDID) 4 MG tablet Take 4 mg by mouth every 6 (six) hours as needed for severe pain.     Marland Kitchen loperamide (IMODIUM A-D) 2 MG tablet Take 1 tablet (2 mg total) by mouth 4 (four) times daily as needed for diarrhea or loose stools. 30 tablet 0  . metoCLOPramide (REGLAN) 10 MG tablet Take 1 tablet (10 mg total) by mouth every 8 (eight) hours as needed for nausea. 45 tablet 0  . metroNIDAZOLE (FLAGYL) 500 MG tablet Place 1 tablet (500 mg total) into feeding tube every 8 (eight) hours. 42 tablet 0  . Multiple Vitamin (MULTIVITAMIN) tablet Take 1 tablet by mouth every morning.     Marland Kitchen MYRBETRIQ 50 MG TB24 tablet Take 50 mg by mouth 2 (two) times daily.     . Nutritional Supplements (FEEDING SUPPLEMENT, OSMOLITE 1.5 CAL,) LIQD Begin Osmolite 1.5 at 20 ml/hr continuous infusion via PEG for 12 hours overnight from 8 pm to 8 am. Increase 10 cc daily to initial goal of 95 cc/hr for 15 hours daily as tolerated.  Flush feeding tube with 240 cc free water 6 times daily. This provides 100% estimated needs. Please send feeding pump and instruct patient on how to use pump. Send TF supplies. Do not send formula. 1422 mL 0  . OLANZapine (ZYPREXA) 10 MG tablet TAKE 1 TABLET(10 MG) BY MOUTH AT BEDTIME 90 tablet 0  . omeprazole (PRILOSEC) 40 MG capsule Take 1 capsule (40 mg total) by mouth every evening. 90 capsule 3  . ondansetron (ZOFRAN) 8 MG tablet Take 1 tablet (8 mg total) by mouth every 8 (eight) hours as needed for nausea or vomiting. 30 tablet 2  . PRESCRIPTION MEDICATION Opdivo at [facility].    Marland Kitchen prochlorperazine (COMPAZINE) 10 MG tablet Take 1 tablet (10 mg total) by mouth every 6 (six) hours as needed for nausea or vomiting. 30 tablet 1  . saccharomyces boulardii (FLORASTOR) 250 MG capsule Take 1 capsule (250 mg total) by mouth daily. Take  any generic probiotic available while on antibiotics 30 capsule 0  . triamcinolone (NASACORT ALLERGY 24HR) 55 MCG/ACT AERO nasal inhaler Place 1 spray into the nose daily as needed (congestion.).     No current facility-administered medications for this visit.   Facility-Administered Medications Ordered in Other Visits  Medication Dose Route Frequency Provider Last Rate Last Dose  . sodium chloride 0.9 % injection 10 mL  10 mL Intracatheter PRN [name], MD   10 mL at 10/18/14 1541    SURGICAL HISTORY:  Past Surgical History  Procedure Laterality Date  . 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 Rotator cuff repair  2008    LEFT SHOULDER  . Bilateral ear drum surgery  1960'S  . Orif left ring finger  and revascularization of radial side  02-16-2007    DEGLOVING INJURY  . Knee arthroscopy  12/21/2011    Procedure: ARTHROSCOPY KNEE;  Surgeon: Tobi Bastos, MD;  Location: [facility];  Service: Orthopedics;  Laterality: Right;  WITH MEDIAL MENISECTOMY  . Cataract extraction Bilateral   . Tonsillectomy      as child  . Ovarian cyst surgery      removed from left jaw area- benign  . Ovarian cyst surgery       NOTE; pt unaware of why this is in his chart  . Shoulder open rotator cuff repair Right 06/05/2014    Procedure: RIGHT ROTATOR CUFF REPAIR SHOULDER OPEN;  Surgeon: Latanya Maudlin, MD;  Location: [facility];  Service: Orthopedics;  Laterality: Right;  . Ercp N/A 08/09/2014    Procedure: ENDOSCOPIC RETROGRADE CHOLANGIOPANCREATOGRAPHY (ERCP);  Surgeon: Carol Ada, MD;  Location: [facility];  Service: Endoscopy;  Laterality: N/A;  . Eus N/A 08/09/2014    Procedure: UPPER ENDOSCOPIC ULTRASOUND (EUS) RADIAL;  Surgeon: Carol Ada, MD;  Location: [facility];  Service: Endoscopy;  Laterality: N/A;  . Fine needle aspiration N/A 08/09/2014    Procedure: FINE NEEDLE ASPIRATION (FNA) LINEAR;  Surgeon:  Carol Ada, MD;  Location: [facility];  Service: Endoscopy;  Laterality: N/A;  . Esophagogastroduodenoscopy (egd) with propofol N/A 08/09/2014    Procedure: ESOPHAGOGASTRODUODENOSCOPY (EGD) WITH PROPOFOL;  Surgeon: Carol Ada, MD;  Location: [facility];  Service: Endoscopy;  Laterality: N/A;    REVIEW OF SYSTEMS:  Constitutional: positive for anorexia, fatigue and weight loss Eyes: negative Ears, nose, mouth, throat, and face: negative Respiratory: positive for cough, dyspnea on exertion and pneumonia Cardiovascular: negative Gastrointestinal: positive for dysphagia Genitourinary:negative Integument/breast: negative Hematologic/lymphatic: negative Musculoskeletal:negative Neurological: negative Behavioral/Psych: negative Endocrine: negative Allergic/Immunologic: negative   PHYSICAL EXAMINATION: General appearance: alert, cooperative, cachectic, fatigued and no distress Head: Normocephalic, without obvious abnormality, atraumatic Neck: no adenopathy, no JVD, supple, symmetrical, trachea midline and thyroid not enlarged, symmetric, no tenderness/mass/nodules Lymph nodes: Cervical, supraclavicular, and axillary nodes normal. Resp: clear to auscultation bilaterally Back: symmetric, no curvature. ROM normal. No CVA tenderness. Cardio: regular rate and rhythm, S1, S2 normal, no murmur, click, rub or gallop GI: soft, non-tender; bowel sounds normal; no masses,  no organomegaly Extremities: extremities normal, atraumatic, no cyanosis or edema Neurologic: Alert and oriented X 3, normal strength and tone. Normal symmetric reflexes. Normal coordination and gait  ECOG PERFORMANCE STATUS: 2 - Symptomatic, <50% confined to bed  There were no vitals taken for this visit.  LABORATORY DATA: Lab Results  Component Value Date   WBC 12.1* 12/13/2014   HGB 10.7* 12/13/2014   HCT 32.5* 12/13/2014   MCV 96.0 12/13/2014   PLT 235 12/13/2014      Chemistry      Component Value Date/Time     NA 135* 12/13/2014 1333   NA 133* 12/03/2014 0543   K 4.7 12/13/2014 1333   K 4.0 12/03/2014 0543   CL 97* 12/03/2014 0543   CL 109* 08/29/2012 0856   CO2 31* 12/13/2014 1333   CO2 29 12/03/2014 0543   BUN 20.2 12/13/2014 1333   BUN 26* 12/03/2014 0543   CREATININE 0.8 12/13/2014 1333   CREATININE 1.02 12/03/2014 0543      Component Value Date/Time   CALCIUM 9.9 12/13/2014 1333   CALCIUM 9.0 12/03/2014 0543   ALKPHOS 345* 12/13/2014 1333   ALKPHOS 376* 11/26/2014 0458   AST 39* 12/13/2014 1333   AST 46* 11/26/2014 0458   ALT 29 12/13/2014 1333   ALT 65* 11/26/2014 0458   BILITOT 0.52 12/13/2014 1333   BILITOT 1.3* 11/26/2014 0458  RADIOGRAPHIC STUDIES: Dg Chest 2 View  12/02/2014   CLINICAL DATA:  Weakness. Cough. Chest congestion. History of non-small-cell lung cancer.  EXAM: CHEST  2 VIEW  COMPARISON:  11/29/2014.  Chest CT dated 11/25/2014.  FINDINGS: Normal sized heart. Clear left lung. No significant change in a right superior mediastinal mass and right lower lobe opacity. Thoracic spine degenerative changes.  IMPRESSION: 1. Stable right paratracheal mass. 2. Stable right lower lobe pneumonia.   Electronically Signed   By: Claudie Revering M.D.   On: 12/02/2014 13:20   Dg Chest 2 View  11/29/2014   CLINICAL DATA:  Patient with past history of left-sided bronchogenic lung cancer recently discharged for sepsis and pneumonia.  EXAM: CHEST  2 VIEW  COMPARISON:  Chest radiograph 11/27/2014  FINDINGS: Right anterior chest wall Port-A-Cath stable in position with tip projecting over the superior cavoatrial junction. Stable cardiac and mediastinal contours with persistent right peritracheal mass. Persistent right mid and lower lung pulmonary consolidation with elevation of the right hemidiaphragm. The left lung is clear. Small right pleural effusion. No pneumothorax.  IMPRESSION: Stable right mid and lower lung consolidation.   Electronically Signed   By: Lovey Newcomer M.D.   On:  11/29/2014 09:13   Dg Chest 2 View  11/25/2014   CLINICAL DATA:  Pneumonia with worsening shortness of Breath. Confusion. History of non-small cell lung cancer.  EXAM: CHEST  2 VIEW  COMPARISON:  11/18/2014  FINDINGS: The cardiac silhouette, mediastinal and hilar contours are stable. Persistent right lower lobe pneumonia. Stable underlying emphysematous changes. The power port is stable.  IMPRESSION: Persistent right lower lobe pneumonia.   Electronically Signed   By: Marijo Sanes M.D.   On: 11/25/2014 18:24   Ct Angio Chest Pe W/cm &/or Wo Cm  11/25/2014   CLINICAL DATA:  Acute onset of shortness of breath, productive cough and confusion. Initial encounter.  EXAM: CT ANGIOGRAPHY CHEST WITH CONTRAST  TECHNIQUE: Multidetector CT imaging of the chest was performed using the standard protocol during bolus administration of intravenous contrast. Multiplanar CT image reconstructions and MIPs were obtained to evaluate the vascular anatomy.  CONTRAST:  44m OMNIPAQUE IOHEXOL 350 MG/ML SOLN  COMPARISON:  Chest radiograph performed earlier today at at 5:44 p.m., and CT of the chest performed 08/06/2014  FINDINGS: There is no evidence of pulmonary embolus.  Dense right lower lobe pneumonia is noted. Pockets of fluid within the right lower lobe raise concern for necrotizing pneumonia. A loculated small right-sided pleural effusion is noted, with peripheral enhancement, likely reflecting underlying empyema.  Scarring and calcification noted at the medial aspect of the right lung, and the left lung apex. Scattered blebs are noted in the periphery of the left lung. There is no evidence of pneumothorax.  A right paratracheal mass has increased in size, measuring 5.1 x 4.3 cm, compatible with recurrent malignancy. Additional enlarged right paratracheal and subcarinal nodes measure up to 1.5 cm in short axis. Trace pericardial fluid remains within normal limits. Diffuse coronary artery calcifications seen. Scattered  calcification is noted along the aortic arch and proximal great vessels. No axillary lymphadenopathy is seen. The thyroid gland is diminutive and unremarkable in appearance. A right-sided chest port is noted ending about the distal SVC.  The visualized portions of the liver and spleen are unremarkable. A common bile duct stent is partially imaged, with underlying pneumobilia.  No acute osseous abnormalities are seen.  Review of the MIP images confirms the above findings.  IMPRESSION: 1. No evidence of  pulmonary embolus. 2. Dense right lower lobe pneumonia noted. Pockets of fluid within the right lower lobe raise concern for necrotizing pneumonia. Underlying loculated peripherally enhancing small right pleural effusion seen, likely reflecting associated empyema. 3. Right paratracheal mass has increased in size, measuring 5.1 x 4.3 cm, compatible with recurrent malignancy. Additional enlarged mediastinal nodes likely also reflect nodal metastases. 4. Diffuse coronary artery calcifications seen. 5. Common bile duct stent, with underlying pneumobilia. These results were called by telephone at the time of interpretation on 11/25/2014 at 10:05 pm to Dr. Quintella Reichert, who verbally acknowledged these results.   Electronically Signed   By: Garald Balding M.D.   On: 11/25/2014 22:07   Dg Chest Port 1 View  11/27/2014   CLINICAL DATA:  Status post right-sided thoracentesis.  EXAM: PORTABLE CHEST - 1 VIEW  COMPARISON:  11/26/2014  FINDINGS: The cardiac silhouette, mediastinal and hilar contours are stable. Persistent right lower lobe airspace opacity. No obvious pleural effusion. No postprocedural pneumothorax.  IMPRESSION: Stable chest x-ray findings. No postprocedural pneumothorax is identified.   Electronically Signed   By: Marijo Sanes M.D.   On: 11/27/2014 12:45   Dg Chest Port 1 View  11/26/2014   CLINICAL DATA:  Sepsis  EXAM: PORTABLE CHEST - 1 VIEW  COMPARISON:  11/25/2014  FINDINGS: There is a right jugular  Port-A-Cath with tip in the low SVC. There is consolidation in the right base, not significantly changed. The left lung remains clear. Pulmonary vasculature is normal. Right peritracheal mass again noted, unchanged.  IMPRESSION: Unchanged right base consolidation. Unchanged right peritracheal mass.   Electronically Signed   By: Andreas Newport M.D.   On: 11/26/2014 04:22   Dg Chest Port 1 View  11/18/2014   CLINICAL DATA:  Hypotension and fever/chills.  EXAM: PORTABLE CHEST - 1 VIEW  COMPARISON:  09/20/2014  FINDINGS: Dense airspace disease in the right lower lobe. Small right pleural effusion. No evidence of cavitation.  Clear left lung.  Normal heart size and stable aortic contours. Fullness of the right peritracheal stripe most recently evaluated by chest CT 08/06/2014.  Biliary stent pneumobilia.  Right IJ porta catheter with tip at the upper SVC.  IMPRESSION: Right lower lobe pneumonia with small effusion.   Electronically Signed   By: Monte Fantasia M.D.   On: 11/18/2014 14:02    ASSESSMENT AND PLAN: This is a very pleasant 69 years old white male with a stage IV non-small cell lung cancer diagnosed in September 2010 status post several chemotherapy regimens including immunotherapy which was discontinued recently secondary to disease progression. Unfortunately his performance status is very poor and the patient has been dealing with several episodes of pneumonia and recent empyema as well as significant dysphagia requiring PEG tube placement for nutrition. I had a lengthy discussion with the patient and his wife today about his current disease status and treatment options. I explained to the patient his wife that his disease progression after the first 3 cycles of immunotherapy is real progression and I would not advise him to continue with immunotherapy at this point. I discussed with him other treatment options including palliative care and hospice referral versus consideration of treatment with  single agent systemic chemotherapy like docetaxel, gemcitabine or Navelbine. Unfortunately his condition is very poor and the patient may not be a good candidate for any of these options. The patient and his wife agreed to the palliative care and hospice referral at this point. I will refer him to the palliative care  and hospice service of [place] [facility]. I will be happy to reevaluate the patient in the future if he has improvement in his condition. I will see him on as-needed basis at this point. He was advised to call immediately if he has any concerning symptoms. The patient voices understanding of current disease status and treatment options and is in agreement with the current care plan.  All questions were answered. The patient knows to call the clinic with any problems, questions or concerns. We can certainly see the patient much sooner if necessary.  I spent 20 minutes counseling the patient face to face. The total time spent in the appointment was 30 minutes.  Disclaimer: This note was dictated with voice recognition software. Similar sounding words can inadvertently be transcribed and may not be corrected upon review.

## 2014-12-13 NOTE — Progress Notes (Signed)
Cancel treatment today, per Dr. [name].

## 2014-12-13 NOTE — Telephone Encounter (Signed)
Added MD visit per 09/16 POF, pt is in office at the moment... KJ

## 2014-12-13 NOTE — Telephone Encounter (Signed)
Referral made to [place].

## 2014-12-13 NOTE — Patient Instructions (Signed)

## 2014-12-14 ENCOUNTER — Encounter: Payer: Self-pay | Admitting: Internal Medicine

## 2014-12-16 ENCOUNTER — Telehealth: Payer: Self-pay | Admitting: *Deleted

## 2014-12-16 NOTE — Telephone Encounter (Signed)
Dr [facility] with [facility] called stating referral for palliative care to [facility] was forwarded to their office ( by Veronia Beets ). [facility] does not provide " palliative care " only " full hospice "  Per Dr Monguloid's review of pt's address- [facility] will not be able to offer services for palliative care.  This message will be sent to Dr Worthy Flank nurse for clarification of needed services- if full hospice or palliative only.

## 2014-12-16 NOTE — Telephone Encounter (Signed)
[facility] said that on Friday , they received faxed records on this pt. I told them to disregard and faxed records to [facility]

## 2014-12-16 NOTE — Telephone Encounter (Signed)
Hospice services requested on phone with Olean Ree on 12/13/14 to [facility] . I am not sure where the confusion re palliative services came from . I Left message at [facility] to verify that order received for Hospice.

## 2014-12-17 ENCOUNTER — Telehealth: Payer: Self-pay | Admitting: *Deleted

## 2014-12-17 ENCOUNTER — Telehealth: Payer: Self-pay | Admitting: Pulmonary Disease

## 2014-12-17 DIAGNOSIS — C3492 Malignant neoplasm of unspecified part of left bronchus or lung: Secondary | ICD-10-CM

## 2014-12-17 MED ORDER — METOCLOPRAMIDE HCL 10 MG PO TABS: 10.0000 mg | ORAL_TABLET | Freq: Three times a day (TID) | ORAL | Status: DC | PRN

## 2014-12-17 NOTE — Telephone Encounter (Signed)
VM message received @ 8:46 am from wife requesting refill on Metaclopramide for nausea.

## 2014-12-17 NOTE — Telephone Encounter (Signed)
Pt still getting IV antibiotic BID at home from [facility]. [name] and thermon are reluctant to use [facility] because they did not have a good experience with them  4 years ago. After our conversation she decided to contact [facility] and set up meeting to discuss services.  reglan refill sent.

## 2014-12-17 NOTE — Telephone Encounter (Signed)
Needs Fu appt with CXR with [facility]

## 2014-12-17 NOTE — Telephone Encounter (Signed)
Called spoke with [name]. Pt will be finishing flagyl 500 mg. Pt was given this from hospital.  Place 1 tablet (500 mg total) into feeding tube every 8 (eight) hours. #42  [name] wants to know if pt needs to continue this. Please advise Dr. Elsworth Soho thanks

## 2014-12-17 NOTE — Telephone Encounter (Signed)
Called spoke with [name]. appt scheduled for next month w/ [facility]. Nothing further needed

## 2014-12-18 ENCOUNTER — Telehealth: Payer: Self-pay | Admitting: Medical Oncology

## 2014-12-18 NOTE — Telephone Encounter (Signed)
Per [name], Dr Doyle Askew reordered reglan and  zofran . She is going to hold of antibiotic until he is seen by Dr Emogene Morgan. She requested palliative care through [facility]. I called Betsy at [facility] and she is going to reevaluate if they can provide palliative service.

## 2014-12-19 ENCOUNTER — Other Ambulatory Visit: Payer: Self-pay | Admitting: *Deleted

## 2014-12-19 ENCOUNTER — Telehealth: Payer: Self-pay | Admitting: Medical Oncology

## 2014-12-19 NOTE — Patient Outreach (Signed)
12/19/14- Telephone call to patient for transition of care week 3, RN CM has permission to speak with wife Adela Lank,  Per wife, pt saw oncologist 12/13/14 to discuss options and pt may be candidate for palliative or hospice but at present pt is going to continue receiving IV antibiotics from [place] and then follow up with pulmonary MD next Tuesday 12/24/14 and have chest xray. Pt also wants to continue gaining weight and "see if he feels any better", then discuss his options, wife says " things kind of on hold right now".  Weight today 98.8 pounds.  Pt still has dizziness and therapist for vestibular therapy was at patient's home today, pt taking in adequate fluids and blood pressure "better", today's reading 114/70.  Pt is taking in 2100 calories daily via feeding tube, still not taking in foods po.  Wife decided not get prescription for ProMod protein powder after talking with dietician, will wait as they feel protein intake is adequate at present.  Wife does not feel a home visit is needed but agreeable to continue transition of care calls weekly.  THN CM Care Plan Problem One        Most Recent Value   Care Plan Problem One  Altered nutrition: less than body requirements, related to poor intake/ disease process   Role Documenting the Problem One  Care Management [place] for Problem One  Active   THN Long Term Goal (31-90 days)  pt will maintain current weight within 60 days   THN Long Term Goal Start Date  12/05/14   Interventions for Problem One Long Term Goal  RN CM reviewed need for adequate fluids to maintain blood pressure, prevent dehydration, importance of adequate calories and protein.   THN CM Short Term Goal #1 (0-30 days)  wife will talk with dietician at [place] about protein needs of client within 7 days.   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     PLAN Continue weekly transition of care calls  [name] [facility], [place] Coordinator [phone]

## 2014-12-19 NOTE — Telephone Encounter (Signed)
Wife stated that [name] is going to stay with [facility] for feedings and fluids now and see Dr Elsworth Soho next week and reevaluate need for hospice.She will call us for any problems or concerns.

## 2014-12-23 LAB — LACTATE DEHYDROGENASE, PLEURAL OR PERITONEAL FLUID

## 2014-12-23 LAB — PROTEIN, BODY FLUID

## 2014-12-24 ENCOUNTER — Telehealth: Payer: Self-pay | Admitting: Pulmonary Disease

## 2014-12-24 ENCOUNTER — Ambulatory Visit (INDEPENDENT_AMBULATORY_CARE_PROVIDER_SITE_OTHER)
Admission: RE | Admit: 2014-12-24 | Discharge: 2014-12-24 | Disposition: A | Discharge status: Home / Self Care | Payer: Medicare Other | Source: Ambulatory Visit | Attending: Adult Health | Admitting: Adult Health

## 2014-12-24 ENCOUNTER — Ambulatory Visit (INDEPENDENT_AMBULATORY_CARE_PROVIDER_SITE_OTHER): Payer: Medicare Other | Admitting: Adult Health

## 2014-12-24 ENCOUNTER — Encounter: Payer: Self-pay | Admitting: Adult Health

## 2014-12-24 VITALS — BP 116/76 | HR 109 | Temp 98.0°F | Ht 67.0 in | Wt 98.6 lb

## 2014-12-24 DIAGNOSIS — J189 Pneumonia, unspecified organism: Secondary | ICD-10-CM

## 2014-12-24 DIAGNOSIS — J850 Gangrene and necrosis of lung: Secondary | ICD-10-CM

## 2014-12-24 DIAGNOSIS — C3492 Malignant neoplasm of unspecified part of left bronchus or lung: Secondary | ICD-10-CM

## 2014-12-24 NOTE — Assessment & Plan Note (Signed)
Metastatic lung cancer w/ progressive disease on most recent CT  Pt has chosen to go forward with hospice, consult pending next week once abx are finished.  Follow up with Hospice as planned

## 2014-12-24 NOTE — Telephone Encounter (Signed)
Per 12/24/14 OV; Patient Instructions       Finish antibiotics as planned next week.   Follow up with Dr. Elsworth Soho  In 4 weeks with chest xray   Please contact office for sooner follow up if symptoms do not improve or worsen or seek emergency care   --  Dr. Elsworth Soho please advise if okay to overbook? thanks

## 2014-12-24 NOTE — Progress Notes (Signed)
Subjective:    Patient ID: [name], male    DOB: [birth year], 69 y.o.   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 These resolved with prednisone ? inflammatory  07/07/2011 Spirometry >> mild airway obstruction, FEv1 93%  Tapered off pred - 7/13  Pt stopped the symbicort/albuterol bc he did not notice a difference   Post hosp OV - Adm 2/25-2/27/15 with increased dyspnea.  Travelled to [place] for Tgiving   Has been exposed to paint fumes within the home, doing some repairs in home.  His CT scan (04/02/13)of the chest, abdomen and pelvis showed no significant evidence for disease progression.  Echo - nml LV fn, trivial pericardial effusion  over last year , breathing, DOE seem to be getting worse. Wears out easily with fatigue and dyspnea.  No chest pain, orthopnea, edema , fever, hemoptysis.  Wt is stable w/ no n/v/d.  PFTs 3/2 /15 nml spirometry, FEV 1 96%, DLCO 33% VQ neg  Transfusion helped    07/02/2013  Chief Complaint  Patient presents with  . Follow-up    per pt breathing is generally pretty good. No wheezing, no chest tx. C/o dry cough, nasal congestion, PND.    Breathing worse after stopping spiriva- restarted completed prednisone Hold off nebs Knee pain better, plts improved, got 1 U prbcs C/o hand cramps   12/24/2014 [name] up : PNA/Empyema / /Metastatic Lung cancer  Pt returns for follow up for recent hospitalization .  Admitted 829 to 9/6 for right sided necrotizing PNA /empyema complicated by lung cancer.  tx w/ prolonged abx. Right sided pleural effusion s/p thoracentesis c/w empyema .  Finishes them next week.  He has had progressive weakness with wt loss and now has a peg tube.  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 Followed by oncology with maintenance chemo . S/p pallative radiotherapy to large abdominal mass  In May 2016 . Immunotherapy started in July 2016 but stopped d/t disease progression.  Recent CT chest showed significant progression of his cancer. .  Seen by Oncology last week and he has opted to go with no further treatment and palliative and hospice referral was made. They will not see him until antibiotics are finished next week.  He remains very weak.  No increased cough or congestion  No fever, or hemoptysis.  Has PEG feeding with osmolite and free water.    Review of Systems neg for any significant sore throat, dysphagia, itching, sneezing, nasal congestion or excess/ purulent secretions, fever, chills, sweats, unintended wt loss, pleuritic or exertional cp, hempoptysis, orthopnea pnd or change in chronic leg swelling. Also denies presyncope, palpitations, heartburn, abdominal pain, nausea, vomiting, diarrhea or change in bowel or urinary habits, dysuria,hematuria, rash, arthralgias, visual complaints, headache, numbness weakness or ataxia.     Objective:   Physical Exam  Gen. Pleasant,frail and elderly in wc  ENT - no lesions, no post nasal drip Neck: No JVD, no thyromegaly, no carotid bruits Lungs: no use of accessory muscles, no dullness to percussion, decreased BS in bases  Cardiovascular: Rhythm regular, heart sounds  normal, no murmurs or gallops, no peripheral  edema Musculoskeletal: No deformities, no cyanosis or clubbing   CXR 12/24/2014  Mild improvement in right lower lobe airspace disease since the most recent examination. No new abnormality.  Reviewed personally      Assessment & Plan:

## 2014-12-24 NOTE — Assessment & Plan Note (Signed)
Right sided necrotizing PNA with empyema with slow interval improvement on cxr  Pt is to finish abx next week as planned Follow up with Dr. Elsworth Soho  In 3-4 weeks with cxr

## 2014-12-24 NOTE — Telephone Encounter (Signed)
OK - michelle will find spot

## 2014-12-24 NOTE — Patient Instructions (Signed)
Finish antibiotics as planned next week.  Follow up with Dr. Elsworth Soho  In 4 weeks with chest xray  Please contact office for sooner follow up if symptoms do not improve or worsen or seek emergency care

## 2014-12-25 NOTE — Telephone Encounter (Signed)
Patient scheduled to see Dr. Elsworth Soho on 10/26 at 9am Patient notified. Nothing further needed.

## 2014-12-25 NOTE — Progress Notes (Signed)
Reviewed & agree with plan  

## 2014-12-26 ENCOUNTER — Other Ambulatory Visit: Payer: Self-pay | Admitting: *Deleted

## 2014-12-26 NOTE — Patient Outreach (Signed)
12/26/14- Telephone call to patient for transition of care week 4, HIPAA verified, spoke with wife (permission has been given by pt). Per wife, pt saw nurse practioner with pulmonary and pt will continue antibiotics until next Tuesday, had xrays completed, will continue with home health and then make decision in few more weeks whether pt wants to choose hospice or not.  Pt to have home health through 02/19/15.  Blood pressure "good" per wife and continues to check daily, weight at MD office 98.6, continues with tube feedings, denies any pain. Wife still does not feel pt needs home visit from [place] as she feels home health is providing adequate care/ resources at present.  THN CM Care Plan Problem One        Most Recent Value   Care Plan Problem One  Altered nutrition: less than body requirements, related to poor intake/ disease process   Role Documenting the Problem One  Care Management [place] for Problem One  Active   THN Long Term Goal (31-90 days)  pt will maintain current weight within 60 days   THN Long Term Goal Start Date  12/05/14   Interventions for Problem One Long Term Goal  RN CM reiterated need for adequate fluids to maintain blood pressure, prevent dehydration, importance of adequate calories and protein.   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     PLAN Pt will be in transition of care for 30 days followup with pt/ wife next week, if no further needs, close case.  [name] [facility], [place] Coordinator [phone]

## 2014-12-27 LAB — FUNGUS CULTURE W SMEAR: FUNGAL SMEAR: NONE SEEN

## 2014-12-30 ENCOUNTER — Telehealth: Payer: Self-pay | Admitting: *Deleted

## 2014-12-30 NOTE — Telephone Encounter (Signed)
FYI [id number] voicemail retrieved at 1659, forwarded at 1700 "Patient's wife [name] calling requesting a call from nurse, Diane.  Could she call me at [phone] or mobile [phone]."

## 2014-12-31 ENCOUNTER — Telehealth: Payer: Self-pay | Admitting: Pulmonary Disease

## 2014-12-31 ENCOUNTER — Telehealth: Payer: Self-pay | Admitting: Medical Oncology

## 2014-12-31 DIAGNOSIS — J189 Pneumonia, unspecified organism: Secondary | ICD-10-CM

## 2014-12-31 DIAGNOSIS — C3492 Malignant neoplasm of unspecified part of left bronchus or lung: Secondary | ICD-10-CM

## 2014-12-31 MED ORDER — DEXTROSE 5 % IV SOLN: 2.0000 g | Freq: Two times a day (BID) | INTRAVENOUS | Status: DC

## 2014-12-31 NOTE — Telephone Encounter (Signed)
Pt reports difficulty swallowing . He is at home and being seen by [facility] through nov. Wife asking if radiation could be considered for treat symptoms due to mass near trachea. Per [facility] refer pt to radiation. Internal order sent.

## 2014-12-31 NOTE — Telephone Encounter (Signed)
Can continue Ceftazidime for additional 1 week  (that will equal a total of 6 weeks )  follow up Dr. Elsworth Soho  As planned with cxr  Please contact office for sooner follow up if symptoms do not improve or worsen or seek emergency care

## 2014-12-31 NOTE — Telephone Encounter (Signed)
I left message to return call.

## 2014-12-31 NOTE — Telephone Encounter (Signed)
Patient's wife called back.  She states they are not going with Hospice right now and are looking to get this cleared up.  She wanted to make Tammy aware of this.

## 2014-12-31 NOTE — Telephone Encounter (Signed)
Per 12/24/14 OV w/ TP: Patient Instructions       Finish antibiotics as planned next week.   Follow up with Dr. Elsworth Soho  In 4 weeks with chest xray   Please contact office for sooner follow up if symptoms do not improve or worsen or seek emergency care   ---  Called spoke with spouse. She wants an extension on pt IV abx's. She does not feel is ready to be done with this. She reports pt SOB is better but not back to normal, has prod cough (white thick phlem). She reports he has not declined since being seen but has had little improvement. Please advise TP thanks

## 2014-12-31 NOTE — Telephone Encounter (Signed)
I spoke to [place] . She said Bastien "feels so  Good"  except the swallowing and coughing due to tracheal mass. She asked if radiation consult would be appropriate to see if tumor could be radiated for  palliation of symptom.

## 2014-12-31 NOTE — Telephone Encounter (Addendum)
Called and spoke with pt's wife [name] Verified that [facility] is the DME  Informed her of TP's recs She voiced understanding and had no further questions Order was placed and rx printed and given to [facility] in [facility] Nothing further needed

## 2015-01-02 ENCOUNTER — Other Ambulatory Visit: Payer: Self-pay | Admitting: Medical Oncology

## 2015-01-02 ENCOUNTER — Encounter (HOSPITAL_COMMUNITY): Payer: Self-pay

## 2015-01-02 ENCOUNTER — Telehealth: Payer: Self-pay | Admitting: Medical Oncology

## 2015-01-02 ENCOUNTER — Ambulatory Visit (HOSPITAL_COMMUNITY)
Admission: RE | Admit: 2015-01-02 | Discharge: 2015-01-02 | Disposition: A | Discharge status: Home / Self Care | Payer: Medicare Other | Source: Ambulatory Visit | Attending: Internal Medicine | Admitting: Internal Medicine

## 2015-01-02 DIAGNOSIS — C3492 Malignant neoplasm of unspecified part of left bronchus or lung: Secondary | ICD-10-CM

## 2015-01-02 DIAGNOSIS — J181 Lobar pneumonia, unspecified organism: Secondary | ICD-10-CM | POA: Insufficient documentation

## 2015-01-02 DIAGNOSIS — I6529 Occlusion and stenosis of unspecified carotid artery: Secondary | ICD-10-CM | POA: Insufficient documentation

## 2015-01-02 DIAGNOSIS — J90 Pleural effusion, not elsewhere classified: Secondary | ICD-10-CM | POA: Diagnosis not present

## 2015-01-02 MED ORDER — IOHEXOL 300 MG/ML  SOLN
100.0000 mL | Freq: Once | INTRAMUSCULAR | Status: AC | PRN
  Administered 2015-01-02: 100 mL via INTRAVENOUS

## 2015-01-02 NOTE — Telephone Encounter (Signed)
I returned sheila's call. She reports pts breathing is a little worse and she is asking about options to keep him comfortable. " His o2 sat is 97%". I called radiation and left message with val if pt can be seen sooner.

## 2015-01-02 NOTE — Telephone Encounter (Signed)
CT neck and chest ordered stat and wife instructed to take pt to radiology for scan now and [place] will be worked in.

## 2015-01-02 NOTE — Telephone Encounter (Signed)
He had an empyema , did not want additional procedures (such as chest tube etc done) Our discussion when he left the hospital was - treat with abx - hospice care -no more radiation / chemoRx

## 2015-01-02 NOTE — Telephone Encounter (Signed)
He needs a scan before seeing me to see if this is worsening pneumonia or a consequence of malignancy.

## 2015-01-03 ENCOUNTER — Other Ambulatory Visit: Payer: Self-pay | Admitting: *Deleted

## 2015-01-03 ENCOUNTER — Encounter: Payer: Self-pay | Admitting: *Deleted

## 2015-01-03 ENCOUNTER — Other Ambulatory Visit: Payer: Self-pay | Admitting: Medical Oncology

## 2015-01-03 ENCOUNTER — Telehealth: Payer: Self-pay | Admitting: Medical Oncology

## 2015-01-03 DIAGNOSIS — C3492 Malignant neoplasm of unspecified part of left bronchus or lung: Secondary | ICD-10-CM

## 2015-01-03 NOTE — Telephone Encounter (Signed)
I left message for [name] to return my call. Per [name] I told her scan is stable and to keep xrt appt next week. She requests  speech rx eval for safe swallowing. Order sent to [facility].

## 2015-01-03 NOTE — Patient Outreach (Signed)
01/03/15- telephone call to patient for follow up on transition of care, pt has had no readmissions during 30 day period, spoke with wife Adela Lank (permission given by pt). Adela Lank states pt had CT scan and "tumors stable and pneumonia clearing up"  Pt continues with IV antibiotics and has home health most likey through November (per wife).  Pt has not decided on hospice as of yet and wife states " will see how things go".  Weight 98 pounds, blood pressure " good" per wife, continues with tube feedings/ osmolite, pt participated in vestibular therapy and has now been discharged, wife states " might have helped a little but he still has dizziness at times"  Wife reports she and pt plan to discuss dizziness and weight with primary MD,  Wife does not feel that pt needs visit by [facility] RN CM citing he already has home health and feels pt has no further needs for care management.  Case closure today, faxed letter to primary MD Dr. Olen Pel.  THN CM Care Plan Problem One        Most Recent Value   Care Plan Problem One  Altered nutrition: less than body requirements, related to poor intake/ disease process   Role Documenting the Problem One  Care Management Coordinator   Care Plan for Problem One  Not Active   THN Long Term Goal (31-90 days)  pt will maintain current weight within 60 days   THN Long Term Goal Start Date  12/05/14   [facility] Long Term Goal Met Date  01/03/15 [pt has maintained current weight]   Interventions for Problem One Long Term Goal  RN CM reviewed need for adequate fluids to maintain blood pressure, prevent dehydration, importance of adequate calories and protein.   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 CM Short Term Goal #1 Met Date   01/03/15 [pt did work with therapist and now discharged]   Interventions for Short Term Goal #2   RN CM reviewed not to ambulate when dizzy and to always ask for assistance, use walker.     Fall Risk  01/03/2015 12/12/2014 02/12/2014 11/13/2013  Falls in the past year? No No No No  Risk for fall due to : Impaired balance/gait;Medication side effect Impaired balance/gait;Medication side effect - -    [name] [facility], [place] Coordinator [phone]

## 2015-01-08 NOTE — Patient Outreach (Signed)
[name] [name]) Care Management  01/08/2015  [name] 03/05/1946 [id number]   Notification from [name], RN to close case due to goals met with [place] Management.  Thanks, Ronnell [place]. [place], [place] Assistant Phone: [phone] Fax: [phone]

## 2015-01-08 NOTE — Progress Notes (Signed)
Thoracic Location of Tumor / Histology: Initial diagnosis of metastatic adenocarcinoma in 2010.    Tobacco/Marijuana/Snuff/ETOH use: former smoker  Past/Anticipated interventions by cardiothoracic surgery, if any:   Past/Anticipated interventions by medical oncology, if QMG:[id number] alimta and avastin have been discontinued.Large mass right upper paratracheal mediastinum.  Signs/Symptoms  Weight changes, if any: diminished appetite  Respiratory complaints, if VQX:[id number]  Hemoptysis, if any: No. White productive secretions.  Pain issues, if any: No  SAFETY ISSUES:  Prior radiation?08/19/14 thru  09/04/14 30 Gy in 10 fractions porta-hepatic lymph nodes  Pacemaker/ICD? No  Possible current pregnancy?N/A  Is the patient on methotrexate? No  Current Complaints / other details: Patient has had difficulty swallowing and is now on  24 hour continuous tube feeding.Completed iv antibiotic therapy on Tuesday 01/06/17. BP 109/75 mmHg  Pulse 108  Temp(Src) 108 F (42.2 C)  Wt 102 lb (46.267 kg)  SpO2 97%  Wt Readings from Last 3 Encounters:  01/09/15 102 lb (46.267 kg)  12/24/14 98 lb 9.6 oz (44.725 kg)  12/05/14 96 lb (43.545 kg)

## 2015-01-09 ENCOUNTER — Ambulatory Visit
Admission: RE | Admit: 2015-01-09 | Discharge: 2015-01-09 | Disposition: A | Discharge status: Home / Self Care | Payer: Medicare Other | Source: Ambulatory Visit | Attending: Radiation Oncology | Admitting: Radiation Oncology

## 2015-01-09 VITALS — BP 109/75 | HR 108 | Temp 108.0°F | Wt 102.0 lb

## 2015-01-09 DIAGNOSIS — C3492 Malignant neoplasm of unspecified part of left bronchus or lung: Secondary | ICD-10-CM

## 2015-01-09 DIAGNOSIS — R42 Dizziness and giddiness: Secondary | ICD-10-CM | POA: Insufficient documentation

## 2015-01-09 DIAGNOSIS — R1310 Dysphagia, unspecified: Secondary | ICD-10-CM | POA: Diagnosis not present

## 2015-01-09 DIAGNOSIS — Z510 Encounter for antineoplastic radiation therapy: Secondary | ICD-10-CM | POA: Diagnosis present

## 2015-01-09 DIAGNOSIS — C772 Secondary and unspecified malignant neoplasm of intra-abdominal lymph nodes: Secondary | ICD-10-CM | POA: Insufficient documentation

## 2015-01-09 LAB — AFB CULTURE WITH SMEAR (NOT AT ARMC): Acid Fast Smear: NONE SEEN

## 2015-01-09 NOTE — Progress Notes (Addendum)
   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  He unfortunately had disease progression on immunotherapy and chemotherapy has been discontinued.  He was hospitalized in September for pneumonia. A PEG tube was placed at that admission for nutrition due to dysphagia.  He states he can only swallow sips of water. He is having a difficult time managing his secretions. He coughs up mucus constantly.   Hospice was recommended by oncology and pulmonology.  They currently have home health and are undecided on whether or not to call hospice. Recent CT of the neck/chest on 10/6 showed no abnormalities of the neck and a large right paratracheal mass (5.1 x 4.4 cm) stable from 8/29. The right lower lobe process has decreased in size suggesting resolution.    His main complaint today is dizziness.  He has been struggling with this for a year. He was seen by Dr. Constance Holster a year ago who worked him up for vertigo. MRI of the brain was negative at that time.  He states this is worse when standing or sitting and is mostly relieved when lying down. He has been evaluated by PT who did not think this was consistent with vertigo. He has an appointment with an ENT, Dr. Laurance Flatten, in [facility] tomorrow. His wife requests his MRI results for this visit.   He is accompanied by his wife and son.   Physical Exam:  Filed Vitals:   01/09/15 1134  BP: 109/75  Pulse: 108  Temp: 108 F (42.2 C)  Weight: 102 lb (46.267 kg)  SpO2: 97%   [place] gentleman on the exam room bed. Lying  down flat. Answers questions appropriately. No respiratory distress.   IMPRESSION: [name] is a 69 y.o. male with metastatic lung cancer to abdominal lymph nodes with excellent response to palliative radiation in the abdomen but with progressive paratracheal disease.   PLAN: We discussed palliative radiation to this right paratracheal lymph node. This is a re-treatment situation and may help alleviate some of his dysphagia while at the same time may cause temporary odynophagia.  I am hesitant to bring him in for treatment as he is so debilitated from his dizziness. He and his wife agreed that they would pursue this ENT appointment tomorrow and call if they would like to schedule simulation for retreatment of this paratracheal node.  In the end, he is dying and I discussed goals of care with him.  He would like to be able to swallow and get rid of the dizziness and hopefully we will be able to accomplish that.  I am not sure that they are communicating well as a family and will ask our social worker to follow up with them. I gave them a copy of his MRI scan report for his appointment with Dr. Laurance Flatten.  Thea Silversmith, MD

## 2015-01-15 ENCOUNTER — Ambulatory Visit
Admission: RE | Admit: 2015-01-15 | Discharge: 2015-01-15 | Disposition: A | Discharge status: Home / Self Care | Payer: Medicare Other | Source: Ambulatory Visit | Attending: Radiation Oncology | Admitting: Radiation Oncology

## 2015-01-15 DIAGNOSIS — Z510 Encounter for antineoplastic radiation therapy: Secondary | ICD-10-CM | POA: Diagnosis not present

## 2015-01-15 DIAGNOSIS — C772 Secondary and unspecified malignant neoplasm of intra-abdominal lymph nodes: Secondary | ICD-10-CM

## 2015-01-15 NOTE — Progress Notes (Signed)
[facility] Simulation and Treatment Planning Note   Name: Flor Houdeshell MRN: [id number]  Date: 01/15/2015  DOB: July 20, 1945  Status: outpatient    DIAGNOSIS: Bronchogenic cancer of left lung ([place])   Staging form: Lung, AJCC 7th Edition     Clinical: Metastatic non-small cell lung cancer, adenocarcinoma with negative EGFR mutation and negative ALK gene translocation - Signed by [name], MD on 05/15/2013     Pathologic: No stage assigned - Unsigned     CONSENT VERIFIED: yes   SET UP AND IMMOBILIZATION: Patient is setup supine with head in a Qfix mask.    NARRATIVE: The patient was brought to the [place].  Identity was confirmed.  All relevant records and images related to the planned course of therapy were reviewed.  Then, the patient was positioned in a stable reproducible clinical set-up for radiation therapy.  CT images were obtained.  Skin markings were placed.  The CT images were loaded into the planning software where the target and avoidance structures were contoured.  The radiation prescription was entered and confirmed.   TREATMENT PLANNING NOTE:  Treatment planning then occurred. I have requested 3D simulation with [facility] of the spinal cord, total lungs and gross tumor volume. I have also requested mlcs and an isodose plan. MLCs are medically necessary to shield critical normal structures including the spinal cord and normal lungs.   Special treatment procedure will be performed as Raynelle Chary will be receiving treatment in a previously treated area.     ------------------------------------------------  Thea Silversmith, MD  This document serves as a record of services personally performed by Thea Silversmith, MD. It was created on her behalf by Derek Mound, a trained medical scribe. The creation of this record is based on the scribe's personal observations and the provider's statements to them. This document  has been checked and approved by the attending provider.

## 2015-01-21 DIAGNOSIS — Z510 Encounter for antineoplastic radiation therapy: Secondary | ICD-10-CM | POA: Diagnosis not present

## 2015-01-22 ENCOUNTER — Ambulatory Visit (INDEPENDENT_AMBULATORY_CARE_PROVIDER_SITE_OTHER): Payer: Medicare Other | Admitting: Pulmonary Disease

## 2015-01-22 ENCOUNTER — Ambulatory Visit
Admission: RE | Admit: 2015-01-22 | Discharge: 2015-01-22 | Disposition: A | Discharge status: Home / Self Care | Payer: Medicare Other | Source: Ambulatory Visit | Attending: Radiation Oncology | Admitting: Radiation Oncology

## 2015-01-22 ENCOUNTER — Encounter: Payer: Self-pay | Admitting: Pulmonary Disease

## 2015-01-22 VITALS — BP 102/62 | HR 105 | Ht 67.0 in | Wt 105.0 lb

## 2015-01-22 DIAGNOSIS — Z510 Encounter for antineoplastic radiation therapy: Secondary | ICD-10-CM | POA: Diagnosis not present

## 2015-01-22 DIAGNOSIS — C3492 Malignant neoplasm of unspecified part of left bronchus or lung: Secondary | ICD-10-CM | POA: Diagnosis not present

## 2015-01-22 DIAGNOSIS — E43 Unspecified severe protein-calorie malnutrition: Secondary | ICD-10-CM

## 2015-01-22 DIAGNOSIS — R42 Dizziness and giddiness: Secondary | ICD-10-CM

## 2015-01-22 NOTE — Progress Notes (Signed)
   Subjective:    Patient ID: [name], male    DOB: February 15, 1946, 69 y.o.   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 Immunotherapy started in July 2016 but stopped d/t disease progression.    01/22/2015  Chief Complaint  Patient presents with  . Follow-up    Pt c/o of continued dyspnea even with minimal exertion. Pt denies cough/wheeze/CP/tightness.     Admitted 829 to 9/6 for right sided necrotizing PNA /empyema complicated by lung cancer.  tx w/ prolonged abx. Right sided pleural effusion s/p thoracentesis c/w empyema .  ceftaz x 6 wks  CT chest 10/6 - rt empyema -resolved  He has progressive weakness with wt loss and a peg tube.  Does not take PO due to dysphagia . ENT (moore) showed paralysed cord & injection is planned He has persistent dizziness not relieved by antivert, valium, patches. Aten stopped Radiation to rt paratracheal mass is planned  Significant tests/ events  CT chest 04/2011 showed Multiple new ground-glass nodules in the lower lobes - not seen in 01/2011. These resolved with prednisone ? inflammatory  07/07/2011 Spirometry >> mild airway obstruction, FEv1 93%   Pt stopped the symbicort/albuterol bc he did not notice a difference   PFTs 05/2013 nml spirometry, FEV 1 96%, DLCO 33% VQ neg   Review of Systems neg for any significant sore throat, dysphagia, itching, sneezing, nasal congestion or excess/ purulent secretions, fever, chills, sweats, unintended wt loss, pleuritic or exertional cp, hempoptysis, orthopnea pnd or change in chronic leg swelling. Also denies presyncope, palpitations, heartburn, abdominal pain, nausea, vomiting, diarrhea or change in bowel or urinary habits, dysuria,hematuria, rash, arthralgias, visual complaints, headache,  numbness weakness or ataxia.     Objective:   Physical Exam  Gen. Pleasant, cachectic, in no distress ENT - no lesions, no post nasal drip Neck: No JVD, no thyromegaly, no carotid bruits Lungs: no use of accessory muscles, no dullness to percussion, clear without rales or rhonchi  Cardiovascular: Rhythm regular, heart sounds  normal, no murmurs or gallops, no peripheral edema Musculoskeletal: No deformities, no cyanosis, clubbing +         Assessment & Plan:

## 2015-01-22 NOTE — Assessment & Plan Note (Signed)
Failure to thrive Hope is that after vocal cord injection, he can resume PO, but I highly doubt it

## 2015-01-22 NOTE — Assessment & Plan Note (Signed)
Empyema has resolved Palliative RT to rt paratracheal mass planned - ok to proceed I would really favor hospice care here - but he & wife [name] are not ready for this yet

## 2015-01-22 NOTE — Patient Instructions (Signed)
Good luck with radiation

## 2015-01-22 NOTE — Assessment & Plan Note (Signed)
No clear cause identified Symptomatic rx has not relieved

## 2015-01-23 ENCOUNTER — Ambulatory Visit
Admission: RE | Admit: 2015-01-23 | Discharge: 2015-01-23 | Disposition: A | Discharge status: Home / Self Care | Payer: Medicare Other | Source: Ambulatory Visit | Attending: Radiation Oncology | Admitting: Radiation Oncology

## 2015-01-23 DIAGNOSIS — Z510 Encounter for antineoplastic radiation therapy: Secondary | ICD-10-CM | POA: Diagnosis not present

## 2015-01-23 NOTE — Progress Notes (Signed)
Pt here for patient teaching.  Pt given Radiation and You booklet, Managing Acute Radiation Side Effects for Head and Neck Cancer handout and Biafine. Pt reports they have not watched the Radiation Therapy Education video.  Reviewed areas of pertinence such as mouth changes, skin changes, throat changes and taste changes . Pt able to give teach back of to pat skin, use unscented/gentle soap and drink plenty of water,Sonafine, apply Radiaplex bid, apply Biafine bid, avoid applying anything to skin within 4 hours of treatment, avoid wearing an under wire bra and to use an electric razor if they must shave. Pt needs reinforcement of information given and will contact nursing with any questions or concerns.    Link to rt [email or web address] given

## 2015-01-24 ENCOUNTER — Ambulatory Visit: Payer: Medicare Other

## 2015-01-24 ENCOUNTER — Encounter: Payer: Self-pay | Admitting: Radiation Oncology

## 2015-01-24 ENCOUNTER — Ambulatory Visit
Admission: RE | Admit: 2015-01-24 | Discharge: 2015-01-24 | Disposition: A | Discharge status: Home / Self Care | Payer: Medicare Other | Source: Ambulatory Visit | Attending: Radiation Oncology | Admitting: Radiation Oncology

## 2015-01-24 VITALS — BP 119/82 | HR 105 | Resp 16 | Wt 105.0 lb

## 2015-01-24 DIAGNOSIS — Z510 Encounter for antineoplastic radiation therapy: Secondary | ICD-10-CM | POA: Diagnosis not present

## 2015-01-24 DIAGNOSIS — C772 Secondary and unspecified malignant neoplasm of intra-abdominal lymph nodes: Secondary | ICD-10-CM

## 2015-01-24 MED ORDER — LORAZEPAM 0.5 MG PO TABS
0.5000 mg | ORAL_TABLET | Freq: Once | ORAL | Status: AC
  Administered 2015-01-24: 0.5 mg
  Filled 2015-01-24: qty 1

## 2015-01-24 MED ORDER — LORAZEPAM 1 MG PO TABS: 1.0000 mg | ORAL_TABLET | Freq: Three times a day (TID) | ORAL | Status: DC

## 2015-01-24 NOTE — Progress Notes (Signed)
Patient reports that treatment was better after taking ativan today.  He denies feeling sleepy or dizzy.

## 2015-01-24 NOTE — Progress Notes (Signed)
Patient presented prior to treatment requesting to be seen by physician. Patient states, "I want to quit treatment." Evaluate statement further to learn that the treatment mask makes the patient anxious and causes him to have a hard time breath. Also, patient reports difficulty swallowing worse while wearing the mask thus, causing him to gag. Patient willing to attempt treatment with medication to relax him. Reports he has taken valium before prescribed by his neurologist. Patient understands from Dr. Pablo Ledger there is possibly a work around that can be used instead of the mask. Patient's pupils are pin point. Family member reports the only medications the patient has had today is compazine, reglan, zofran and Imodium.   BP 119/82 mmHg  Pulse 105  Resp 16  Wt 105 lb (47.628 kg)  SpO2 100% Wt Readings from Last 3 Encounters:  01/24/15 105 lb (47.628 kg)  01/22/15 105 lb (47.628 kg)  01/09/15 102 lb (46.267 kg)

## 2015-01-24 NOTE — Progress Notes (Signed)
  Radiation Oncology         [phone]   Name: [name] MRN: [id number]   Date: 01/24/2015  DOB: Oct 02, 1945     Weekly Radiation Therapy Management     ICD-9-CM ICD-10-CM   1. Cancer of intra-abdominal lymph nodes, secondary (HCC) 196.2 C77.2 LORazepam (ATIVAN) tablet 0.5 mg    Current Dose: 6 Gy  Planned Dose:  30 Gy  Narrative The patient presents for routine under treatment assessment. Patient presented prior to treatment requesting to be seen by physician. Patient states, "I want to quit treatment." Evaluate statement further to learn that the treatment mask makes the patient anxious and causes him to have a hard time breath. Also, patient reports difficulty swallowing worse while wearing the mask thus, causing him to gag. Patient willing to attempt treatment with medication to relax him. Reports he has taken valium before prescribed by his neurologist. Patient understands from Dr. Pablo Ledger there is possibly a work around that can be used instead of the mask. Patient's pupils are pin point. Family member reports the only medications the patient has had today is compazine, reglan, zofran and Imodium. The patient is without complaint. Set-up films were reviewed. The chart was checked.  Physical Findings  weight is 105 lb (47.628 kg). His blood pressure is 119/82 and his pulse is 105. His respiration is 16 and oxygen saturation is 100%. . Weight essentially stable.  No significant changes.  Impression The patient is tolerating radiation.  Plan Continue treatment as planned. Patient was prescribed Ativan 1 mg to help with anxiety during treatment.  He will be given 0.5 mg ativan in water per PEG today prior to radiation.  We will discuss opening his facemask.     [name], M.D.  This document serves as a record of services personally performed by Tyler Pita, MD. It was created on his behalf by Lendon Collar, a trained medical scribe. The creation of this  record is based on the scribe's personal observations and the provider's statements to them. This document has been checked and approved by the attending provider.

## 2015-01-24 NOTE — Progress Notes (Signed)
Patient given 0.5 mg ativan crushed through his peg tube tube per Dr. Tammi Klippel.

## 2015-01-27 ENCOUNTER — Ambulatory Visit
Admission: RE | Admit: 2015-01-27 | Discharge: 2015-01-27 | Disposition: A | Discharge status: Home / Self Care | Payer: Medicare Other | Source: Ambulatory Visit | Attending: Radiation Oncology | Admitting: Radiation Oncology

## 2015-01-27 ENCOUNTER — Ambulatory Visit: Payer: Medicare Other

## 2015-01-27 DIAGNOSIS — Z510 Encounter for antineoplastic radiation therapy: Secondary | ICD-10-CM | POA: Diagnosis not present

## 2015-01-28 ENCOUNTER — Encounter: Payer: Self-pay | Admitting: Radiation Oncology

## 2015-01-28 ENCOUNTER — Ambulatory Visit
Admission: RE | Admit: 2015-01-28 | Discharge: 2015-01-28 | Disposition: A | Discharge status: Home / Self Care | Payer: Medicare Other | Source: Ambulatory Visit | Attending: Radiation Oncology | Admitting: Radiation Oncology

## 2015-01-28 ENCOUNTER — Other Ambulatory Visit (HOSPITAL_COMMUNITY)
Admission: RE | Admit: 2015-01-28 | Discharge: 2015-01-28 | Disposition: A | Discharge status: Home / Self Care | Payer: Medicare Other | Source: Other Acute Inpatient Hospital | Attending: Family Medicine | Admitting: Family Medicine

## 2015-01-28 ENCOUNTER — Ambulatory Visit: Payer: Medicare Other

## 2015-01-28 VITALS — BP 91/66 | HR 104 | Temp 98.0°F | Ht 67.0 in | Wt 105.9 lb

## 2015-01-28 DIAGNOSIS — N390 Urinary tract infection, site not specified: Secondary | ICD-10-CM | POA: Diagnosis present

## 2015-01-28 DIAGNOSIS — Z510 Encounter for antineoplastic radiation therapy: Secondary | ICD-10-CM | POA: Diagnosis not present

## 2015-01-28 DIAGNOSIS — C772 Secondary and unspecified malignant neoplasm of intra-abdominal lymph nodes: Secondary | ICD-10-CM

## 2015-01-28 LAB — URINALYSIS, ROUTINE W REFLEX MICROSCOPIC
BILIRUBIN URINE: NEGATIVE
Glucose, UA: NEGATIVE mg/dL
Hgb urine dipstick: NEGATIVE
KETONES UR: NEGATIVE mg/dL
LEUKOCYTES UA: NEGATIVE
NITRITE: NEGATIVE
PH: 7 (ref 5.0–8.0)
PROTEIN: NEGATIVE mg/dL
Specific Gravity, Urine: 1.01 (ref 1.005–1.030)
UROBILINOGEN UA: 0.2 mg/dL (ref 0.0–1.0)

## 2015-01-28 NOTE — Progress Notes (Signed)
[name] presents for his 5th fraction of radiation to his Right Paratracheal area. He reports his energy level is the same as before radiation started. He is trying to exercise more, and get around his house more during the day. His skin is normal in color to his mid-chest, and he denies tenderness to this area. He takes osmolite 24 hour/day through his PEG tube for nutrition. He does not take any intake orally, and receives nutrition, pills, and liquid via his PEG. He reports shortness of breath with exertion. His oxygen saturation is  99% on room air today.     BP 91/66 mmHg  Pulse 104  Temp(Src) 98 F (36.7 C)  Ht '5\' 7"'$  (1.702 m)  Wt 105 lb 14.4 oz (48.036 kg)  BMI 16.58 kg/m2   Wt Readings from Last 3 Encounters:  01/28/15 105 lb 14.4 oz (48.036 kg)  01/24/15 105 lb (47.628 kg)  01/22/15 105 lb (47.628 kg)

## 2015-01-28 NOTE — Progress Notes (Signed)
Weekly Management Note Current Dose: 15  Gy  Projected Dose: 30 Gy   Narrative:  The patient presents for routine under treatment assessment.  CBCT/MVCT images/Port film x-rays were reviewed.  The chart was checked. Doing well. Maybe less pressure in throat. No vertigo today. No improvement in swallowing. Vocal cord procedure next Thursday. No pain.   Physical Findings: Weight: 105 lb 14.4 oz (48.036 kg). Unchanged. Hoarse.   Impression:  The patient is tolerating radiation.  Plan:  Continue treatment as planned.

## 2015-01-29 ENCOUNTER — Ambulatory Visit
Admission: RE | Admit: 2015-01-29 | Discharge: 2015-01-29 | Disposition: A | Discharge status: Home / Self Care | Payer: Medicare Other | Source: Ambulatory Visit | Attending: Radiation Oncology | Admitting: Radiation Oncology

## 2015-01-29 ENCOUNTER — Ambulatory Visit: Payer: Medicare Other

## 2015-01-29 DIAGNOSIS — Z510 Encounter for antineoplastic radiation therapy: Secondary | ICD-10-CM | POA: Diagnosis not present

## 2015-01-30 ENCOUNTER — Ambulatory Visit
Admission: RE | Admit: 2015-01-30 | Discharge: 2015-01-30 | Disposition: A | Discharge status: Home / Self Care | Payer: Medicare Other | Source: Ambulatory Visit | Attending: Radiation Oncology | Admitting: Radiation Oncology

## 2015-01-30 ENCOUNTER — Ambulatory Visit: Admission: RE | Admit: 2015-01-30 | Payer: Medicare Other | Source: Ambulatory Visit

## 2015-01-30 DIAGNOSIS — Z510 Encounter for antineoplastic radiation therapy: Secondary | ICD-10-CM | POA: Diagnosis not present

## 2015-01-31 ENCOUNTER — Ambulatory Visit: Payer: Medicare Other

## 2015-01-31 ENCOUNTER — Ambulatory Visit
Admission: RE | Admit: 2015-01-31 | Discharge: 2015-01-31 | Disposition: A | Discharge status: Home / Self Care | Payer: Medicare Other | Source: Ambulatory Visit | Attending: Radiation Oncology | Admitting: Radiation Oncology

## 2015-01-31 DIAGNOSIS — Z510 Encounter for antineoplastic radiation therapy: Secondary | ICD-10-CM | POA: Diagnosis not present

## 2015-02-03 ENCOUNTER — Ambulatory Visit
Admission: RE | Admit: 2015-02-03 | Discharge: 2015-02-03 | Disposition: A | Discharge status: Home / Self Care | Payer: Medicare Other | Source: Ambulatory Visit | Attending: Radiation Oncology | Admitting: Radiation Oncology

## 2015-02-03 ENCOUNTER — Ambulatory Visit: Payer: Medicare Other

## 2015-02-04 ENCOUNTER — Ambulatory Visit
Admission: RE | Admit: 2015-02-04 | Discharge: 2015-02-04 | Disposition: A | Discharge status: Home / Self Care | Payer: Medicare Other | Source: Ambulatory Visit | Attending: Radiation Oncology | Admitting: Radiation Oncology

## 2015-02-04 ENCOUNTER — Encounter: Payer: Self-pay | Admitting: Radiation Oncology

## 2015-02-04 ENCOUNTER — Ambulatory Visit: Payer: Medicare Other

## 2015-02-04 VITALS — BP 109/75 | HR 109 | Temp 97.4°F | Ht 67.0 in | Wt 106.5 lb

## 2015-02-04 DIAGNOSIS — Z510 Encounter for antineoplastic radiation therapy: Secondary | ICD-10-CM | POA: Diagnosis not present

## 2015-02-04 DIAGNOSIS — C772 Secondary and unspecified malignant neoplasm of intra-abdominal lymph nodes: Secondary | ICD-10-CM

## 2015-02-04 NOTE — Progress Notes (Signed)
  Radiation Oncology         [phone] ________________________________  Name: [name] MRN: [id number]  Date: 02/04/2015  DOB: 06/18/1945  End of Treatment Note  Diagnosis:   Bronchogenic cancer of left lung ([place])   Staging form: Lung, AJCC 7th Edition     Clinical: Metastatic non-small cell lung cancer, adenocarcinoma with negative EGFR mutation and negative ALK gene translocation - Signed by [name], MD on 05/15/2013     Pathologic: No stage assigned - Unsigned     Indication for treatment:  Palliative       Radiation treatment dates:   01/22/15-02/04/15  Site/dose:   Right paratracheal node (retreat) to 30 Gy in 10 fractions  Beams/energy:   IMRT with helical tomotherapy and 6 MV photons  Narrative: The patient tolerated radiation treatment relatively well.   He unfortunately had not had much response in terms of swallowing at the end of treatment. He was investigating ENT intervention if no response in a few weeks.   Plan: The patient has completed radiation treatment. The patient will return to radiation oncology clinic for routine followup in one month. I advised them to call or return sooner if they have any questions or concerns related to their recovery or treatment.  ------------------------------------------------  Thea Silversmith, MD

## 2015-02-04 NOTE — Progress Notes (Signed)
Mr. Steck is here for his last treatment of radiation to his Right Paratracheal node. He reports is energy level is normal for him. He does complains of dizziness which is constant whether sitting or standing. He is using  1.5 Osmolite continuous 24 hours a day at 65cc/hr. He takes 800-1000cc's free water through his tube daily. He is not taking anything by mouth. His saliva is thin, but not able to swallow it. His skin is slightly red at the radiation site, but he denies any tenderness.   BP 109/75 mmHg  Pulse 109  Temp(Src) 97.4 F (36.3 C)  Ht '5\' 7"'$  (1.702 m)  Wt 106 lb 8 oz (48.308 kg)  BMI 16.68 kg/m2  SpO2 99%   Wt Readings from Last 3 Encounters:  02/04/15 106 lb 8 oz (48.308 kg)  01/28/15 105 lb 14.4 oz (48.036 kg)  01/24/15 105 lb (47.628 kg)

## 2015-02-05 ENCOUNTER — Ambulatory Visit: Payer: Medicare Other

## 2015-02-10 ENCOUNTER — Telehealth: Payer: Self-pay | Admitting: *Deleted

## 2015-02-10 NOTE — Telephone Encounter (Signed)
Spouse called asking about Dr. Pablo Ledger sending recent CT scans to Dr. Laurance Flatten with [facility].  Call transferred to RT.

## 2015-02-16 NOTE — Progress Notes (Signed)
Radiation Oncology         [phone]) [phone] ________________________________  Name: [name]      MRN: [id number]          Date: 01/15/2015              DOB: 08/21/1945  Optical Surface Tracking Plan:  Since intensity modulated radiotherapy (IMRT) and 3D conformal radiation treatment methods are predicated on accurate and precise positioning for treatment, intrafraction motion monitoring is medically necessary to ensure accurate and safe treatment delivery.  The ability to quantify intrafraction motion without excessive ionizing radiation dose can only be performed with optical surface tracking. Accordingly, surface imaging offers the opportunity to obtain 3D measurements of patient position throughout IMRT and 3D treatments without excessive radiation exposure.  I am ordering optical surface tracking for this patient's upcoming course of radiotherapy. ________________________________ Signature   Reference:   Ursula Alert, J, et al. Surface imaging-based analysis of intrafraction motion for breast radiotherapy patients.Journal of [place], n. 6, nov. 2014. ISSN [id number].   Available at: <http://www.jacmp.org/index.php/jacmp/article/view/4957>.

## 2015-02-26 ENCOUNTER — Telehealth: Payer: Self-pay | Admitting: *Deleted

## 2015-02-26 NOTE — Telephone Encounter (Signed)
Call from [facility] states "Pt wife called [facility] requesting hospice . MD had referred pt to hospice 2 months prior, service was refused.  MaryBeth confirming if MD will still be attending. Revieiwed with MD, returned call to Maudie Flakes MD will be attending.

## 2015-03-05 ENCOUNTER — Telehealth: Payer: Self-pay | Admitting: Neurology

## 2015-03-12 ENCOUNTER — Other Ambulatory Visit: Payer: Self-pay | Admitting: Neurology

## 2015-03-12 ENCOUNTER — Encounter: Payer: Self-pay | Admitting: Neurology

## 2015-03-12 ENCOUNTER — Ambulatory Visit (INDEPENDENT_AMBULATORY_CARE_PROVIDER_SITE_OTHER): Payer: Medicare Other | Admitting: Neurology

## 2015-03-12 VITALS — BP 97/72 | HR 106 | Ht 68.0 in | Wt 112.0 lb

## 2015-03-12 DIAGNOSIS — R42 Dizziness and giddiness: Secondary | ICD-10-CM | POA: Diagnosis not present

## 2015-03-12 DIAGNOSIS — R269 Unspecified abnormalities of gait and mobility: Secondary | ICD-10-CM | POA: Diagnosis not present

## 2015-03-12 DIAGNOSIS — R251 Tremor, unspecified: Secondary | ICD-10-CM

## 2015-03-12 HISTORY — DX: Unspecified abnormalities of gait and mobility: R26.9

## 2015-03-12 MED ORDER — TOPIRAMATE 25 MG PO TABS: ORAL_TABLET | ORAL | Status: DC

## 2015-03-12 NOTE — Patient Instructions (Signed)
We will start Topamax for the dizziness, retry vestibular rehab.  Topamax (topiramate) is a seizure medication that has an FDA approval for seizures and for migraine headache. Potential side effects of this medication include weight loss, cognitive slowing, tingling in the fingers and toes, and carbonated drinks will taste bad. If any significant side effects are noted on this drug, please contact our office.  Dizziness Dizziness is a common problem. It is a feeling of unsteadiness or light-headedness. You may feel like you are about to faint. Dizziness can lead to injury if you stumble or fall. Anyone can become dizzy, but dizziness is more common in older adults. This condition can be caused by a number of things, including medicines, dehydration, or illness. HOME CARE INSTRUCTIONS Taking these steps may help with your condition: Eating and Drinking  Drink enough fluid to keep your urine clear or pale yellow. This helps to keep you from becoming dehydrated. Try to drink more clear fluids, such as water.  Do not drink alcohol.  Limit your caffeine intake if directed by your health care provider.  Limit your salt intake if directed by your health care provider. Activity  Avoid making quick movements.  Rise slowly from chairs and steady yourself until you feel okay.  In the morning, first sit up on the side of the bed. When you feel okay, stand slowly while you hold onto something until you know that your balance is fine.  Move your legs often if you need to stand in one place for a long time. Tighten and relax your muscles in your legs while you are standing.  Do not drive or operate heavy machinery if you feel dizzy.  Avoid bending down if you feel dizzy. Place items in your home so that they are easy for you to reach without leaning over. Lifestyle  Do not use any tobacco products, including cigarettes, chewing tobacco, or electronic cigarettes. If you need help quitting, ask your  health care provider.  Try to reduce your stress level, such as with yoga or meditation. Talk with your health care provider if you need help. General Instructions  Watch your dizziness for any changes.  Take medicines only as directed by your health care provider. Talk with your health care provider if you think that your dizziness is caused by a medicine that you are taking.  Tell a friend or a family member that you are feeling dizzy. If he or she notices any changes in your behavior, have this person call your health care provider.  Keep all follow-up visits as directed by your health care provider. This is important. SEEK MEDICAL CARE IF:  Your dizziness does not go away.  Your dizziness or light-headedness gets worse.  You feel nauseous.  You have reduced hearing.  You have new symptoms.  You are unsteady on your feet or you feel like the room is spinning. SEEK IMMEDIATE MEDICAL CARE IF:  You vomit or have diarrhea and are unable to eat or drink anything.  You have problems talking, walking, swallowing, or using your arms, hands, or legs.  You feel generally weak.  You are not thinking clearly or you have trouble forming sentences. It may take a friend or family member to notice this.  You have chest pain, abdominal pain, shortness of breath, or sweating.  Your vision changes.  You notice any bleeding.  You have a headache.  You have neck pain or a stiff neck.  You have a fever.  This information is not intended to replace advice given to you by your health care provider. Make sure you discuss any questions you have with your health care provider.   Document Released: 09/08/2000 Document Revised: 07/30/2014 Document Reviewed: 03/11/2014 Elsevier Interactive Patient Education [employer].

## 2015-03-12 NOTE — Progress Notes (Signed)
Reason for visit: Dizziness  [name] is an 69 y.o. male  History of present illness:  [name] is a 69 year old right-handed white male with a history of lung cancer that is metastatic. The patient has had about a one-year history of dizziness, but the dizziness has significantly worsened over time, particularly since August 2016. The patient has undergone chemotherapy previously. He was found to have a Pseudomonas ear infection, treated by Dr. Polly Cobia. The patient indicates that the dizziness seemed to worsen after this, but the antibiotic therapy did not improve the dizziness. He developed a pneumonia in August 2016, his dizziness has converted to being daily,with symptoms all day long. Previously, the dizziness would come on with rapid head movements. The dizziness now is present with sitting and standing, the patient has to focus on something to help reduce the dizziness, this is associated with nausea. The patient has had bowel obstruction, and he now has a PEG tube in place. He is getting multivitamins through the tube feeds. He has been given a trial on Antivert without benefit. A Transderm scope patch offered no benefit, and made him hallucinate. The patient got one day of benefit with vestibular rehabilitation, but subsequent therapies did not help. He only had 2 sessions, however. The patient feels best when he is lying down flat. He generally runs blood pressures in the 22L systolic. He returns for an evaluation. With the dizziness, the patient takes these having increasing problems with balance, he walks with a walker, he will fall on occasion.  Past Medical History  Diagnosis Date  . Hypertension   . Arthritis RIGHT SHOULDER  . Immature cataract BILATERAL  . Non-small cell lung cancer ([place]) DX SEPT 2010  W/ CHEMORADIATION AT THAT TIME -- NOW  W/ METS--  CURRENTLY ON MAINTENANCE CHEMO TX  EVERY 30 DAYS    ONCOLOGIST- DR [name]  . Acute meniscal tear of knee RIGHT KNEE    . BPH (benign prostatic hypertrophy)   . Dizziness and giddiness 04/23/2014  . Tremor 04/23/2014    Right hand  . HOH (hard of hearing)     bilateral hearing aids  . Radiation 01/08/09-01/21/09    Mediastinum 30 Gy x 12 fractions  . Radiation 08/19/14-09/04/14    Palliative RT Porta hepatic LN 30 Gy  . Pneumonia   . Abnormality of gait 03/12/2015    Past Surgical History  Procedure Laterality Date  . 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 Rotator cuff repair  2008    LEFT SHOULDER  . Bilateral ear drum surgery  1960'S  . Orif left ring finger  and revascularization of radial side  02-16-2007    DEGLOVING INJURY  . Knee arthroscopy  12/21/2011    Procedure: ARTHROSCOPY KNEE;  Surgeon: Tobi Bastos, MD;  Location: [facility];  Service: Orthopedics;  Laterality: Right;  WITH MEDIAL MENISECTOMY  . Cataract extraction Bilateral   . Tonsillectomy      as child  . Ovarian cyst surgery      removed from left jaw area- benign  . Ovarian cyst surgery       NOTE; pt unaware of why this is in his chart  . Shoulder open rotator cuff repair Right 06/05/2014    Procedure: RIGHT ROTATOR CUFF REPAIR SHOULDER OPEN;  Surgeon: Latanya Maudlin, MD;  Location: [facility];  Service: Orthopedics;  Laterality: Right;  . Ercp N/A  08/09/2014    Procedure: ENDOSCOPIC RETROGRADE CHOLANGIOPANCREATOGRAPHY (ERCP);  Surgeon: Carol Ada, MD;  Location: [facility];  Service: Endoscopy;  Laterality: N/A;  . Eus N/A 08/09/2014    Procedure: UPPER ENDOSCOPIC ULTRASOUND (EUS) RADIAL;  Surgeon: Carol Ada, MD;  Location: [facility];  Service: Endoscopy;  Laterality: N/A;  . Fine needle aspiration N/A 08/09/2014    Procedure: FINE NEEDLE ASPIRATION (FNA) LINEAR;  Surgeon: Carol Ada, MD;  Location: [facility];  Service: Endoscopy;  Laterality: N/A;  . Esophagogastroduodenoscopy (egd) with propofol N/A 08/09/2014    Procedure:  ESOPHAGOGASTRODUODENOSCOPY (EGD) WITH PROPOFOL;  Surgeon: Carol Ada, MD;  Location: [facility];  Service: Endoscopy;  Laterality: N/A;  . Bile duct stent placement    . Gastrostomy tube placement      Family History  Problem Relation Age of Onset  . Colon cancer    . Heart attack    . Cancer    . Hypertension    . Hyperlipidemia    . Congestive Heart Failure Mother   . Cancer Sister   . Cancer Maternal Grandfather   . Colon cancer Father   . Heart attack Brother     Social history:  reports that he quit smoking about 34 years ago. His smoking use included Cigarettes. He has a 40 pack-year smoking history. He has never used smokeless tobacco. He reports that he does not drink alcohol or use illicit drugs.    Allergies  Allergen Reactions  . Augmentin [Amoxicillin-Pot Clavulanate] Diarrhea  . Carboplatin     Rash and itching after carbo test dose  . Levaquin [Levofloxacin] Other (See Comments)    Knee pain  . Percocet [Oxycodone-Acetaminophen] Nausea And Vomiting  . Scopolamine     hallucinations  . Zolpidem Tartrate Diarrhea    Medications:  Prior to Admission medications   Medication Sig Start Date End Date Taking? Authorizing Provider  b complex vitamins tablet Take 1 tablet by mouth daily.   Yes Historical Provider, MD  metoCLOPramide (REGLAN) 10 MG tablet Take 1 tablet (10 mg total) by mouth every 8 (eight) hours as needed for nausea. 12/17/14  Yes [name], MD  Multiple Vitamin (MULTIVITAMIN) tablet Take 1 tablet by mouth every morning.    Yes Historical Provider, MD  Nutritional Supplements (FEEDING SUPPLEMENT, OSMOLITE 1.5 CAL,) LIQD Begin Osmolite 1.5 at 20 ml/hr continuous infusion via PEG for 12 hours overnight from 8 pm to 8 am. Increase 10 cc daily to initial goal of 95 cc/hr for 15 hours daily as tolerated.  Flush feeding tube with 240 cc free water 6 times daily. This provides 100% estimated needs. Please send feeding pump and instruct patient on how  to use pump. Send TF supplies. Do not send formula. 11/07/14  Yes [name], MD  omeprazole (PRILOSEC) 40 MG capsule Take 1 capsule (40 mg total) by mouth every evening. 11/07/14  Yes Adrena E Johnson, PA-C  ondansetron (ZOFRAN) 8 MG tablet Take 1 tablet (8 mg total) by mouth every 8 (eight) hours as needed for nausea or vomiting. 11/22/14  Yes Ripudeep Krystal Eaton, MD  prochlorperazine (COMPAZINE) 10 MG tablet Take 1 tablet (10 mg total) by mouth every 6 (six) hours as needed for nausea or vomiting. 10/15/14  Yes [name], MD  triamcinolone (NASACORT ALLERGY 24HR) 55 MCG/ACT AERO nasal inhaler Place 1 spray into the nose daily as needed (congestion.).   Yes Historical Provider, MD    ROS:  Out of a complete 14 system review of symptoms, the  patient complains only of the following symptoms, and all other reviewed systems are negative.  Decreased activity, decreased appetite, weight loss Double vision Shortness of breath, difficulty swallowing Cold intolerance Nausea Restless legs Frequency of urination Joint pain, walking difficulty Bruising easily, anemia Dizziness, weakness  Blood pressure 97/72, pulse 106, height '5\' 8"'$  (1.727 m), weight 112 lb (50.803 kg).  Physical Exam  General: The patient is alert and cooperative at the time of the examination. The patient appears quite thin.  Skin: No significant peripheral edema is noted.   Neurologic Exam  Mental status: The patient is alert and oriented x 3 at the time of the examination. The patient has apparent normal recent and remote memory, with an apparently normal attention span and concentration ability.   Cranial nerves: Facial symmetry is present. Speech is normal, no aphasia or dysarthria is noted. Extraocular movements are full. Visual fields are full. There appears to be some end gaze nystagmus with a horizontal and rotatory component.  Motor: The patient has good strength in all 4 extremities.  Sensory examination:  Soft touch sensation is symmetric on the face, arms, and legs.  Coordination: The patient has good finger-nose-finger and heel-to-shin bilaterally, with except that there is some tremor with finger nose finger with the right arm.  Gait and station: The patient has a normal gait. Tandem gait is normal. Romberg is negative. No drift is seen.  Reflexes: Deep tendon reflexes are symmetric.   Assessment/Plan:  1. Lung cancer, metastatic  2. Dizziness, vertigo, worsening course  3. Gait disturbance  4. Right upper extremity tremor  The patient had decline in his overall functional level. The dizziness has worsened over time, he has been unresponsive to medical therapy that has included meclizine, Transderm scope patch, and diazepam. We will try Topamax briefly, and set him up for vestibular rehabilitation again. He will follow-up in 3-4 months.  Jill Alexanders MD 03/12/2015 7:28 PM  [facility] [address] [place], [place] [address]  Phone [phone] Fax [phone]

## 2015-03-13 ENCOUNTER — Encounter: Payer: Self-pay | Admitting: *Deleted

## 2015-03-13 ENCOUNTER — Ambulatory Visit
Admission: RE | Admit: 2015-03-13 | Discharge: 2015-03-13 | Disposition: A | Discharge status: Home / Self Care | Payer: Medicare Other | Source: Ambulatory Visit | Attending: Radiation Oncology | Admitting: Radiation Oncology

## 2015-03-13 DIAGNOSIS — C772 Secondary and unspecified malignant neoplasm of intra-abdominal lymph nodes: Secondary | ICD-10-CM

## 2015-03-13 NOTE — Progress Notes (Signed)
Spoke with patient and wife. Pt reports they had previously cancelled todays appointment. Wife and patient expressed they didn't want to reschedule the appointment at this time. She reports they are in touch with Hospice care. Pt has gained weight, but she reports he is still "dizzy" and unstable on his feet. Even with my encouragement to reschedule, they refused. They will call if they have further concerns. Notified Dr. Pablo Ledger.

## 2015-03-13 NOTE — Addendum Note (Signed)
Encounter addended by: Thea Silversmith, MD on: 03/13/2015 11:39 AM<BR>     Documentation filed: Notes Section

## 2015-03-14 ENCOUNTER — Encounter: Payer: Self-pay | Admitting: Physical Therapy

## 2015-03-14 ENCOUNTER — Ambulatory Visit: Payer: Medicare Other | Attending: Neurology | Admitting: Physical Therapy

## 2015-03-14 DIAGNOSIS — R6889 Other general symptoms and signs: Secondary | ICD-10-CM | POA: Insufficient documentation

## 2015-03-14 DIAGNOSIS — R269 Unspecified abnormalities of gait and mobility: Secondary | ICD-10-CM | POA: Insufficient documentation

## 2015-03-14 DIAGNOSIS — R42 Dizziness and giddiness: Secondary | ICD-10-CM | POA: Insufficient documentation

## 2015-03-14 NOTE — Patient Instructions (Signed)
Tip Card 1.The goal of habituation training is to assist in decreasing symptoms of vertigo, dizziness, or nausea provoked by specific head and body motions. 2.These exercises may initially increase symptoms; however, be persistent and work through symptoms. With repetition and time, the exercises will assist in reducing or eliminating symptoms. 3.Exercises should be stopped and discussed with the therapist if you experience any of the following: - Sudden change or fluctuation in hearing - New onset of ringing in the ears, or increase in current intensity - Any fluid discharge from the ear - Severe pain in neck or back - Extreme nausea. If dizziness increases by 3 or more from baseline OR if dizziness increases to > 5/10.  Copyright  VHI. All rights reserved.  Sit to Side-Lying   Sit on edge of bed. Lie down onto the right side and hold until dizziness stops, plus 20 seconds.  Return to sitting and wait until dizziness stops, plus 20 seconds.  Repeat to the left side. Repeat sequence 5 times per session. Do 2 sessions per day.  Copyright  VHI. All rights reserved.  Gaze Stabilization: Tip Card 1.Target must remain in focus, not blurry, and appear stationary while head is in motion. 2.Perform exercises with small head movements (45 to either side of midline). 3.Increase speed of head motion so long as target is in focus. 4.If you wear eyeglasses, be sure you can see target through lens (therapist will give specific instructions for bifocal / progressive lenses). 5.These exercises may provoke dizziness or nausea. Work through these symptoms. If too dizzy, slow head movement slightly. Rest between each exercise. 6.Exercises demand concentration; avoid distractions. 7.For safety, perform standing exercises close to a counter, wall, corner, or next to someone.  Gaze Stabilization: Sitting    Keeping eyes on target on wall 5-6 feet away, tilt head down 15-30 and move head side to side for  _30___ seconds. Repeat while moving head up and down for _30___ seconds. Do __2__ sessions per day.

## 2015-03-14 NOTE — Therapy (Signed)
[facility] [address] [place], [place], [address] Phone: [phone]   Fax:  [phone]  Physical Therapy Evaluation  Patient Details  Name: [name] MRN: [id number] Date of Birth: 1945/06/01 Referring Provider: Margette Fast, MD  Encounter Date: 03/14/2015      PT End of Session - 03/14/15 1318    Visit Number 1   Number of Visits 9  eval + 8 visits   Date for PT Re-Evaluation 04/13/15   Authorization Type Medicare - GCodes required   PT Start Time 1108   PT Stop Time 1151   PT Time Calculation (min) 43 min   Activity Tolerance Patient tolerated treatment well   Behavior During Therapy [facility] for tasks assessed/performed      Past Medical History  Diagnosis Date  . Hypertension   . Arthritis RIGHT SHOULDER  . Immature cataract BILATERAL  . Non-small cell lung cancer ([place]) DX SEPT 2010  W/ CHEMORADIATION AT THAT TIME -- NOW  W/ METS--  CURRENTLY ON MAINTENANCE CHEMO TX  EVERY 30 DAYS    ONCOLOGIST- DR [name]  . Acute meniscal tear of knee RIGHT KNEE  . BPH (benign prostatic hypertrophy)   . Dizziness and giddiness 04/23/2014  . Tremor 04/23/2014    Right hand  . HOH (hard of hearing)     bilateral hearing aids  . Radiation 01/08/09-01/21/09    Mediastinum 30 Gy x 12 fractions  . Radiation 08/19/14-09/04/14    Palliative RT Porta hepatic LN 30 Gy  . Pneumonia   . Abnormality of gait 03/12/2015    Past Surgical History  Procedure Laterality Date  . 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 Rotator cuff repair  2008    LEFT SHOULDER  . Bilateral ear drum surgery  1960'S  . Orif left ring finger  and revascularization of radial side  02-16-2007    DEGLOVING INJURY  . Knee arthroscopy  12/21/2011    Procedure: ARTHROSCOPY KNEE;  Surgeon: Tobi Bastos, MD;  Location: [facility];  Service: Orthopedics;   Laterality: Right;  WITH MEDIAL MENISECTOMY  . Cataract extraction Bilateral   . Tonsillectomy      as child  . Ovarian cyst surgery      removed from left jaw area- benign  . Ovarian cyst surgery       NOTE; pt unaware of why this is in his chart  . Shoulder open rotator cuff repair Right 06/05/2014    Procedure: RIGHT ROTATOR CUFF REPAIR SHOULDER OPEN;  Surgeon: Latanya Maudlin, MD;  Location: [facility];  Service: Orthopedics;  Laterality: Right;  . Ercp N/A 08/09/2014    Procedure: ENDOSCOPIC RETROGRADE CHOLANGIOPANCREATOGRAPHY (ERCP);  Surgeon: Carol Ada, MD;  Location: [facility];  Service: Endoscopy;  Laterality: N/A;  . Eus N/A 08/09/2014    Procedure: UPPER ENDOSCOPIC ULTRASOUND (EUS) RADIAL;  Surgeon: Carol Ada, MD;  [facility];  Service: Endoscopy;  Laterality: N/A;  . Fine needle aspiration N/A 08/09/2014    Procedure: FINE NEEDLE ASPIRATION (FNA) LINEAR;  Surgeon: Carol Ada, MD;  [facility];  Service: Endoscopy;  Laterality: N/A;  . Esophagogastroduodenoscopy (egd) with propofol N/A 08/09/2014    Procedure: ESOPHAGOGASTRODUODENOSCOPY (EGD) WITH PROPOFOL;  Surgeon: Carol Ada, MD;  [facility];  Service: Endoscopy;  Laterality: N/A;  . Bile duct stent placement    . Gastrostomy tube placement  There were no vitals filed for this visit.  Visit Diagnosis:  Dizziness and giddiness - Plan: PT plan of care cert/re-cert  Abnormality of gait - Plan: PT plan of care cert/re-cert  Decreased functional activity tolerance - Plan: PT plan of care cert/re-cert      Subjective Assessment - 03/14/15 1114    Subjective About a year ago, pt began feeling dizzy after pt had pseudomonas infection in L ear. Pt was treated with antibiotics (unsure what type). Since that time, pt has had increasingly more difficulty being upright. Pt reports 0/10 dizziness upon waking up in the morning; however, dizziness (described as "lightheadedness") rating is  >/=  4/10 at all times while pt is seated upright  and increases with movement, standing, and walking. Imaging of brain 1 year ago was unremarkable. This past August, dizziness worsened significantly and pt began using wheelchair for community mobility. HHPT found no s/s of BPPV but pt did note improvement in symptoms immediately after he was evaluated by HHPT for postional vertigo. Pt denies "spinning".    Patient is accompained by: Family member  wife, [name]   Pertinent History metastatic lung cancer (Hospice)   Patient Stated Goals Pt: "To feel better."   Pt's wife: For pt to tolerate being out of bed without being limited by dizziness/lightheadedness.   Currently in Pain? No/denies            [facility] PT Assessment - 03/14/15 0001    Assessment   Medical Diagnosis dizziness   Referring Provider Margette Fast, MD   Onset Date/Surgical Date 02/26/14   Precautions   Precautions Fall   Restrictions   Weight Bearing Restrictions No   Balance Screen   Has the patient fallen in the past 6 months No   Has the patient had a decrease in activity level because of a fear of falling?  Yes   Is the patient reluctant to leave their home because of a fear of falling?  Yes   Prior Function   Level of Independence Requires assistive device for independence   Vocation Retired            Vestibular Assessment - 03/14/15 0001    Vestibular Assessment   General Observation Lightheadedness 0/10 in supine, 4/10 in seated; worst with standing/walking, and head movements.   Symptom Behavior   Type of Dizziness Lightheadedness  When walking, vision is blurred.   Frequency of Dizziness Constantly unless pt is lying down   Duration of Dizziness Constant; worsens for seconds with aggravating activities.   Aggravating Factors Activity in general;Turning body quickly;Turning head quickly;Sit to stand;Supine to sit   Relieving Factors Rest;Lying supine   Occulomotor Exam   Occulomotor Alignment Abnormal   R ptosis   Spontaneous Absent   Gaze-induced Absent   Smooth Pursuits Intact   Saccades Intact;Comment  undershooting in all directions   Comment B Head Thrust test (+) and symptomatic bilaterally   Vestibulo-Occular Reflex   VOR 1 Head Only (x 1 viewing) Slow with both horizontal nad vertical head turns. Dizziness increases from 4/10 to 5/10 with x1 viewing.   Positional Testing   Sidelying Test Sidelying Right;Sidelying Left   Horizontal Canal Testing Horizontal Canal Right;Horizontal Canal Left   Sidelying Right   Sidelying Right Duration NA   Sidelying Right Symptoms No nystagmus   Sidelying Left   Sidelying Left Duration NA   Sidelying Left Symptoms No nystagmus   Horizontal Canal Right   Horizontal Canal Right Duration NA  Horizontal Canal Right Symptoms Normal   Horizontal Canal Left   Horizontal Canal Left Duration NA   Horizontal Canal Left Symptoms Normal   Positional Sensitivities   Sit to Supine Lightheadedness   Supine to Left Side No dizziness   Supine to Right Side No dizziness   Supine to Sitting Moderate dizziness  R/L side lying > sitting               [facility] Adult PT Treatment/Exercise - 03/14/15 0001    Bed Mobility   Bed Mobility Rolling Right;Rolling Left;Supine to Sit;Sit to Supine;Sit to Sidelying Right;Sit to Sidelying Left   Rolling Right 6: Modified independent (Device/Increase time)   Rolling Left 6: Modified independent (Device/Increase time)   Supine to Sit 6: Modified independent (Device/Increase time)   Sit to Supine 5: Supervision   Sit to Sidelying Right 5: Supervision   Sit to Sidelying Left 5: Supervision   Transfers   Transfers Sit to Stand;Stand to Sit   Sit to Stand 4: Min guard   Stand to Sit 4: Min guard   Ambulation/Gait   Ambulation/Gait Yes   Ambulation/Gait Assistance 4: Min assist   Ambulation/Gait Assistance Details L HHA   Ambulation Distance (Feet) 10 Feet  5' x2 trials   Assistive device 1 person hand held  assist   Gait Pattern Step-through pattern;Decreased arm swing - right;Decreased arm swing - left;Decreased stride length;Decreased trunk rotation;Wide base of support   Ambulation Surface Level;Indoor         Vestibular Treatment/Exercise - 03/14/15 0001    Vestibular Treatment/Exercise   Vestibular Treatment Provided Gaze;Habituation   Habituation Exercises [employer]   Gaze Exercises X1 Viewing Horizontal;X1 Viewing Vertical   Nestor Lewandowsky   Number of Reps  2   Symptom Description  from R/L sidelying > sit, pt reports increased lightheadedness   X1 Viewing Horizontal   Foot Position Seated   Comments x30 seconds; slow   X1 Viewing Vertical   Foot Position seated   Comments x30 seconds; slow               PT Education - 03/14/15 1254    Education provided Yes   Education Details PT eval findings, goals, and POC. Initiated HEP; see Pt Instructions.   Person(s) Educated Patient;Spouse   Methods Explanation;Demonstration;Verbal cues;Handout   Comprehension Verbalized understanding;Returned demonstration          PT Short Term Goals - 03/14/15 1333    PT SHORT TERM GOAL #1   Title STG's = LTG's           PT Long Term Goals - 03/14/15 1333    PT LONG TERM GOAL #1   Title Pt will perform initial HEP with mod I using paper handout to maximize functional gains made in PT. Target date: 04/11/15   PT LONG TERM GOAL #2   Title Rule out orthostatic hypotension as origin of symptoms. Target date: 04/11/15   PT LONG TERM GOAL #3   Title Pt/wife will report pt ablity to tolerate being upright (seated) for > 50% of 12-hour day. Target date: 04/11/15   PT LONG TERM GOAL #4   Title Pt will ambulate 25' over level, indoor surfaces with supervision using LRAD with no more 2-point increase in subjective symptoms to indicate increased pt tolerance to household ambulation. Target date: 04/11/15   PT LONG TERM GOAL #5   Title Pt will decrease DHI score from 76% to 58% to  indicate significant decrease in pt-perceived  disability due to symptoms of lightheadedness. Target date: 04/11/15               Plan - 2015/04/11 1320    Clinical Impression Statement Pt is a 69 y/o M referred to outpatient neuro to address impairments associated with dizziness. PT evaluation reveals the following: lightheadedness exacerbated by sitting, standing, walking, and head movement; Head Thrust Test positive and symptomatic bilaterally; abnormal gait pattern; decreased activity tolerance. Pt will benefit from skilled outpatient PT 2x/week for 4 weeks to address said impairments.    Pt will benefit from skilled therapeutic intervention in order to improve on the following deficits Dizziness;Decreased activity tolerance;Abnormal gait;Decreased mobility   Rehab Potential Good   Clinical Impairments Affecting Rehab Potential Pt has metastatic lung cancer and is currently on Hospice care, per chart   PT Frequency 2x / week   PT Duration 4 weeks   PT Treatment/Interventions ADLs/Self Care Home Management;Canalith Repostioning;DME Instruction;Gait training;Stair training;Functional mobility training;Therapeutic activities;Therapeutic exercise;Balance training;Vestibular;Neuromuscular re-education;Patient/family education   PT Next Visit Plan Assess orthostatic vital signs. Check on pt performance of current HEP and consider adding exercises to decrease visual/somatosensory input, decrease reliance on vestibular input.  Consider initiating home program to gradually increase pt tolerance to being upright.   PT Home Exercise Plan 2023-04-11: VOR x1 viewing in seated; Nestor Lewandowsky   Consulted and Agree with Plan of Care Patient          G-Codes - 11-Apr-2015 1333    Functional Assessment Tool Used DHI 76%   Functional Limitation Self care   Self Care Current Status (X7939) At least 60 percent but less than 80 percent impaired, limited or restricted   Self Care Goal Status (Q3009) At least 40  percent but less than 60 percent impaired, limited or restricted       Problem List Patient Active Problem List   Diagnosis Date Noted  . Abnormality of gait 03/12/2015  . Lung cancer ([place])   . Protein-calorie malnutrition, severe ([place]) 11/26/2014  . Hypoalbuminemia 11/18/2014  . Leukocytosis 11/18/2014  . Dysphagia 11/18/2014  . Nausea with vomiting 10/03/2014  . Central line complication 23/30/0762  . Anorexia 10/03/2014  . Weight loss 10/03/2014  . Transaminitis 10/03/2014  . Hyperphosphatemia 10/03/2014  . Encounter for antineoplastic immunotherapy 09/28/2014  . Non-small cell lung cancer ([place]) 09/21/2014  . Abdominal pain   . Fever   . 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 Biliary obstruction   . Fatigue 08/27/2014  . Abdominal pain, acute   . Abdominal mass   . Abdominal pain, generalized 08/07/2014  . CRF (chronic renal failure) 08/07/2014  . Obstructive jaundice 08/07/2014  . Malnutrition of moderate degree ([place]) 08/07/2014  . Obstructive jaundice due to cancer 08/06/2014  . Rotator cuff tear, non-traumatic 06/05/2014  . Dizziness and giddiness 04/23/2014  . Tremor 04/23/2014  . Acute on chronic renal failure ([place]) 05/24/2013  . Pancytopenia ([place]) 05/24/2013  . Osteoarthritis of right knee 12/21/2011  . Meniscus, medial, posterior horn derangement 12/21/2011  . Dyspnea 12/04/2010  . Bronchogenic cancer of left lung ([place]) 12/11/2008  . ARTHRITIS 12/10/2008    Billie Ruddy, PT, DPT [facility] [address] [place], [place], [address] Phone: [phone]   Fax:  [phone] 11-Apr-2015, 1:45 PM   Name: Johnte Portnoy MRN: [id number] Date of Birth: [birth year]

## 2015-03-18 ENCOUNTER — Ambulatory Visit: Payer: Medicare Other

## 2015-03-18 DIAGNOSIS — R6889 Other general symptoms and signs: Secondary | ICD-10-CM

## 2015-03-18 DIAGNOSIS — R42 Dizziness and giddiness: Secondary | ICD-10-CM | POA: Diagnosis not present

## 2015-03-18 NOTE — Therapy (Signed)
[facility] [address] [place], [place], [address] Phone: [phone]   Fax:  [phone]  Physical Therapy Treatment  Patient Details  Name: [name] MRN: [id number] Date of Birth: 1945-07-21 Referring Provider: Margette Fast, MD  Encounter Date: 03/18/2015      PT End of Session - 03/18/15 1134    Visit Number 2   Number of Visits 9   Date for PT Re-Evaluation 04/13/15   Authorization Type Medicare - GCodes required   PT Start Time 1018   PT Stop Time 1105   PT Time Calculation (min) 47 min   Activity Tolerance Other (comment)  limited by dizziness/lightheadedness   Behavior During Therapy [facility] for tasks assessed/performed      Past Medical History  Diagnosis Date  . Hypertension   . Arthritis RIGHT SHOULDER  . Immature cataract BILATERAL  . Non-small cell lung cancer ([place]) DX SEPT 2010  W/ CHEMORADIATION AT THAT TIME -- NOW  W/ METS--  CURRENTLY ON MAINTENANCE CHEMO TX  EVERY 30 DAYS    ONCOLOGIST- DR [name]  . Acute meniscal tear of knee RIGHT KNEE  . BPH (benign prostatic hypertrophy)   . Dizziness and giddiness 04/23/2014  . Tremor 04/23/2014    Right hand  . HOH (hard of hearing)     bilateral hearing aids  . Radiation 01/08/09-01/21/09    Mediastinum 30 Gy x 12 fractions  . Radiation 08/19/14-09/04/14    Palliative RT Porta hepatic LN 30 Gy  . Pneumonia   . Abnormality of gait 03/12/2015    Past Surgical History  Procedure Laterality Date  . 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 Rotator cuff repair  2008    LEFT SHOULDER  . Bilateral ear drum surgery  1960'S  . Orif left ring finger  and revascularization of radial side  02-16-2007    DEGLOVING INJURY  . Knee arthroscopy  12/21/2011    Procedure: ARTHROSCOPY KNEE;  Surgeon: Tobi Bastos, MD;  Location: [facility];  Service: Orthopedics;   Laterality: Right;  WITH MEDIAL MENISECTOMY  . Cataract extraction Bilateral   . Tonsillectomy      as child  . Ovarian cyst surgery      removed from left jaw area- benign  . Ovarian cyst surgery       NOTE; pt unaware of why this is in his chart  . Shoulder open rotator cuff repair Right 06/05/2014    Procedure: RIGHT ROTATOR CUFF REPAIR SHOULDER OPEN;  Surgeon: Latanya Maudlin, MD;  [facility];  Service: Orthopedics;  Laterality: Right;  . Ercp N/A 08/09/2014    Procedure: ENDOSCOPIC RETROGRADE CHOLANGIOPANCREATOGRAPHY (ERCP);  Surgeon: Carol Ada, MD;  Location[facility];  Service: Endoscopy;  Laterality: N/A;  . Eus N/A 08/09/2014    Procedure: UPPER ENDOSCOPIC ULTRASOUND (EUS) RADIAL;  Surgeon: Carol Ada, MD;  [facility];  Service: Endoscopy;  Laterality: N/A;  . Fine needle aspiration N/A 08/09/2014    Procedure: FINE NEEDLE ASPIRATION (FNA) LINEAR;  Surgeon: Carol Ada, MD;  [facility];  Service: Endoscopy;  Laterality: N/A;  . Esophagogastroduodenoscopy (egd) with propofol N/A 08/09/2014    Procedure: ESOPHAGOGASTRODUODENOSCOPY (EGD) WITH PROPOFOL;  Surgeon: Carol Ada, MD;  [facility];  Service: Endoscopy;  Laterality: N/A;  . Bile duct stent placement    . Gastrostomy tube placement      There were no  vitals filed for this visit.  Visit Diagnosis:  Dizziness and giddiness  Decreased functional activity tolerance      Subjective Assessment - 03/18/15 1020    Subjective Pt denied falls or changes since last visit. Pt reported HEP is going well.   Patient is accompained by: Family member  [name]   Pertinent History metastatic lung cancer (Hospice)   Patient Stated Goals Pt: "To feel better."   Pt's wife: For pt to tolerate being out of bed without being limited by dizziness/lightheadedness.   Currently in Pain? No/denies                Vestibular Assessment - 03/18/15 1028    Orthostatics   BP supine (x 5  minutes) 114/85 mmHg  no dizziness in supine   HR supine (x 5 minutes) 91   BP sitting 100/76 mmHg  pt reported lightheadedness upon sitting   HR sitting 95   BP standing (after 1 minute) 78/58 mmHg  pt reported lightheadedness   HR standing (after 1 minute) 100     Neuro re-ed: Pt performed and reviewed gaze stabilization and [employer]. Cues for technique, to increase speed of head movements (fluid motion) and to hold for 20 seconds after dizziness subsides during Brandt-Daroff. Please see pt instructions for details.                Self Care:     PT Education - 03/18/15 1126    Education provided Yes   Education Details Educated pt and spouse on orthostatic hypotension, and provided BP measurements to pt to give to PCP today, as he's going there for a flu shot. PT reviewed HEP and encouraged pt to perform sit<>stand with walker for safety, waiting to sit until dizziness subside prior to sitting x10 reps to improve strength, increase activity tolerance, and decr. lightheadedness upon standing. PT informed pt that this note would be forwarded to MDs.   Person(s) Educated Patient;Spouse   Methods Explanation;Demonstration;Verbal cues;Handout   Comprehension Verbalized understanding;Returned demonstration          PT Short Term Goals - 03/14/15 1333    PT SHORT TERM GOAL #1   Title STG's = LTG's           PT Long Term Goals - 03/18/15 1138    PT LONG TERM GOAL #1   Title Pt will perform initial HEP with mod I using paper handout to maximize functional gains made in PT. Target date: 04/11/15   Status On-going   PT LONG TERM GOAL #2   Title Rule out orthostatic hypotension as origin of symptoms. Target date: 04/11/15   Status Achieved   PT LONG TERM GOAL #3   Title Pt/wife will report pt ablity to tolerate being upright (seated) for > 50% of 12-hour day. Target date: 04/11/15   Status On-going   PT LONG TERM GOAL #4   Title Pt will ambulate 79' over  level, indoor surfaces with supervision using LRAD with no more 2-point increase in subjective symptoms to indicate increased pt tolerance to household ambulation. Target date: 04/11/15   Status On-going   PT LONG TERM GOAL #5   Title Pt will decrease DHI score from 76% to 58% to indicate significant decrease in pt-perceived disability due to symptoms of lightheadedness. Target date: 04/11/15   Status On-going               Plan - 03/18/15 1135    Clinical Impression Statement During assessment for  orthostatic hypotension, pt reported lightheadedness during supine to sit and sit to standing. Pt's BP also decreased significantly (>/=19JYNW for systolic and >/=29FAOZ for diastolic BP), indicanting pt has orthostatic hypotension. Pt continues to require cues during HEP for proper technique. Pt required prolonged supine and seated rest breaks during session 2/2 lightheadedness and fatigue. Continue with POC.   Pt will benefit from skilled therapeutic intervention in order to improve on the following deficits Dizziness;Decreased activity tolerance;Abnormal gait;Decreased mobility   Rehab Potential Good   Clinical Impairments Affecting Rehab Potential Pt has metastatic lung cancer and is currently on Hospice care, per chart   PT Frequency 2x / week   PT Duration 4 weeks   PT Treatment/Interventions ADLs/Self Care Home Management;Canalith Repostioning;DME Instruction;Gait training;Stair training;Functional mobility training;Therapeutic activities;Therapeutic exercise;Balance training;Vestibular;Neuromuscular re-education;Patient/family education   PT Next Visit Plan Add vestibular HEP, print sit<>stands for HEP.   PT Home Exercise Plan 12/16: VOR x1 viewing in seated; Nestor Lewandowsky   Consulted and Agree with Plan of Care Patient        Problem List Patient Active Problem List   Diagnosis Date Noted  . Abnormality of gait 03/12/2015  . Lung cancer ([place])   . Protein-calorie malnutrition,  severe ([place]) 11/26/2014  . Hypoalbuminemia 11/18/2014  . Leukocytosis 11/18/2014  . Dysphagia 11/18/2014  . Nausea with vomiting 10/03/2014  . Central line complication 30/86/5784  . Anorexia 10/03/2014  . Weight loss 10/03/2014  . Transaminitis 10/03/2014  . Hyperphosphatemia 10/03/2014  . Encounter for antineoplastic immunotherapy 09/28/2014  . Non-small cell lung cancer ([place]) 09/21/2014  . Abdominal pain   . Fever   . 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 Biliary obstruction   . Fatigue 08/27/2014  . Abdominal pain, acute   . Abdominal mass   . Abdominal pain, generalized 08/07/2014  . CRF (chronic renal failure) 08/07/2014  . Obstructive jaundice 08/07/2014  . Malnutrition of moderate degree ([place]) 08/07/2014  . Obstructive jaundice due to cancer 08/06/2014  . Rotator cuff tear, non-traumatic 06/05/2014  . Dizziness and giddiness 04/23/2014  . Tremor 04/23/2014  . Acute on chronic renal failure ([place]) 05/24/2013  . Pancytopenia ([place]) 05/24/2013  . Osteoarthritis of right knee 12/21/2011  . Meniscus, medial, posterior horn derangement 12/21/2011  . Dyspnea 12/04/2010  . Bronchogenic cancer of left lung ([place]) 12/11/2008  . ARTHRITIS 12/10/2008    Derreck Wiltsey L 03/18/2015, 11:38 AM  [facility] [address] [place], [place], [address] Phone: [phone]   Fax:  [phone]  Name: Tremon Sainvil MRN: [id number] Date of Birth: [birth year]    Geoffry Paradise, PT,DPT 03/18/2015 11:39 AM Phone: [phone] Fax: [phone]

## 2015-03-18 NOTE — Patient Instructions (Addendum)
Orthostatic Hypotension Orthostatic hypotension is a sudden drop in blood pressure. It happens when you quickly stand up from a seated or lying position. You may feel dizzy or light-headed. This can last for just a few seconds or for up to a few minutes. It is usually not a serious problem. However, if this happens frequently or gets worse, it can be a sign of something more serious. CAUSES  Different things can cause orthostatic hypotension, including:   Loss of body fluids (dehydration).  Medicines that lower blood pressure.  Sudden changes in posture, such as standing up quickly after you have been sitting or lying down.  Taking too much of your medicine. SIGNS AND SYMPTOMS   Light-headedness or dizziness.   Fainting or near-fainting.   A fast heart rate.   Weakness.   Feeling tired (fatigue).  DIAGNOSIS  Your health care provider may do several things to help diagnose your condition and identify the cause. These may include:   Taking a medical history and doing a physical exam.  Checking your blood pressure. Your health care provider will check your blood pressure when you are:  Lying down.  Sitting.  Standing.  Using tilt table testing. In this test, you lie down on a table that moves from a lying position to a standing position. You will be strapped onto the table. This test monitors your blood pressure and heart rate when you are in different positions. TREATMENT  Treatment will vary depending on the cause. Possible treatments include:   Changing the dosage of your medicines.  Wearing compression stockings on your lower legs.  Standing up slowly after sitting or lying down.  Eating more salt.  Eating frequent, small meals.  In some cases, getting IV fluids.  Taking medicine to enhance fluid retention. HOME CARE INSTRUCTIONS  Only take over-the-counter or prescription medicines as directed by your health care provider.  Follow your health care  provider's instructions for changing the dosage of your current medicines.  Do not stop or adjust your medicine on your own.  Stand up slowly after sitting or lying down. This allows your body to adjust to the different position.  Wear compression stockings as directed.  Eat extra salt as directed.  Do not add extra salt to your diet unless directed to by your health care provider.  Eat frequent, small meals.  Avoid standing suddenly after eating.  Avoid hot showers or excessive heat as directed by your health care provider.  Keep all follow-up appointments. SEEK MEDICAL CARE IF:  You continue to feel dizzy or light-headed after standing.  You feel groggy or confused.  You feel cold, clammy, or sick to your stomach (nauseous).  You have blurred vision.  You feel short of breath. SEEK IMMEDIATE MEDICAL CARE IF:   You faint after standing.  You have chest pain.  You have difficulty breathing.   You lose feeling or movement in your arms or legs.   You have slurred speech or difficulty talking, or you are unable to talk.  MAKE SURE YOU:   Understand these instructions.  Will watch your condition.  Will get help right away if you are not doing well or get worse.   This information is not intended to replace advice given to you by your health care provider. Make sure you discuss any questions you have with your health care provider.   Document Released: 03/05/2002 Document Revised: 03/20/2013 Document Reviewed: 01/05/2013 Elsevier Interactive Patient Education 2016 [place]  Card 1.The goal of habituation training is to assist in decreasing symptoms of vertigo, dizziness, or nausea provoked by specific head and body motions. 2.These exercises may initially increase symptoms; however, be persistent and work through symptoms. With repetition and time, the exercises will assist in reducing or eliminating symptoms. 3.Exercises should be stopped and  discussed with the therapist if you experience any of the following: - Sudden change or fluctuation in hearing - New onset of ringing in the ears, or increase in current intensity - Any fluid discharge from the ear - Severe pain in neck or back - Extreme nausea. If dizziness increases by 3 or more from baseline OR if dizziness increases to > 5/10.  Copyright  VHI. All rights reserved.  Sit to Side-Lying   Sit on edge of bed. Lie down onto the right side and hold until dizziness stops, plus 20 seconds. Return to sitting and wait until dizziness stops, plus 20 seconds. Repeat to the left side. Repeat sequence 5 times per session. Do 2 sessions per day.  Copyright  VHI. All rights reserved.  Gaze Stabilization: Tip Card 1.Target must remain in focus, not blurry, and appear stationary while head is in motion. 2.Perform exercises with small head movements (45 to either side of midline). 3.Increase speed of head motion so long as target is in focus. 4.If you wear eyeglasses, be sure you can see target through lens (therapist will give specific instructions for bifocal / progressive lenses). 5.These exercises may provoke dizziness or nausea. Work through these symptoms. If too dizzy, slow head movement slightly. Rest between each exercise. 6.Exercises demand concentration; avoid distractions. 7.For safety, perform standing exercises close to a counter, wall, corner, or next to someone.  Gaze Stabilization: Sitting    Keeping eyes on target on wall 5-6 feet away, tilt head down 15-30 and move head side to side for _30___ seconds. Repeat while moving head up and down for _30___ seconds. Do __2__ sessions per day.

## 2015-03-19 ENCOUNTER — Telehealth: Payer: Self-pay | Admitting: *Deleted

## 2015-03-19 NOTE — Telephone Encounter (Signed)
Received call from Melissa/RN/Hospice/[place] stating that pt has had Port a cath @ 7 yrs & questioned pt when it was flushed last & he doesn't recall.  She tried to flush & was unable to get anything through port or aspirate anything either.  She would like to know what Dr [name] suggest.  Pt is coming in to [facility] for vestibular treatments & is willing to come in to have checked if Dr [name] agrees.  Last flush looks like maybe in September of 2016.  Melissa can be reached at [phone].  Message routed to Dr [name] & RN.

## 2015-03-19 NOTE — Telephone Encounter (Signed)
It is okay for him to come to the [facility] for the Port-A-Cath flush. Please order flush for him when he comes.

## 2015-03-20 ENCOUNTER — Telehealth: Payer: Self-pay | Admitting: *Deleted

## 2015-03-20 ENCOUNTER — Telehealth: Payer: Self-pay | Admitting: Internal Medicine

## 2015-03-20 DIAGNOSIS — Z23 Encounter for immunization: Secondary | ICD-10-CM

## 2015-03-20 NOTE — Telephone Encounter (Signed)
Patient's wife Adela Lank called "asking if [name] can receive flu vaccine tomorrow when he comes in for his 1:30 pm flush appointment."  Will send request to Dr. [name] for review.

## 2015-03-20 NOTE — Telephone Encounter (Signed)
Onc tx request sent and I left message for [employer].Marland Kitchen

## 2015-03-20 NOTE — Telephone Encounter (Signed)
Spoke with patients wife and she is aware of the added flush 12/23

## 2015-03-20 NOTE — Telephone Encounter (Signed)
onc tx request sent for flu vaccine tomorrow.Flu vaccine under sign and held.

## 2015-03-21 ENCOUNTER — Ambulatory Visit: Payer: Medicare Other | Admitting: Physical Therapy

## 2015-03-21 ENCOUNTER — Ambulatory Visit: Payer: Medicare Other

## 2015-03-21 ENCOUNTER — Ambulatory Visit (HOSPITAL_BASED_OUTPATIENT_CLINIC_OR_DEPARTMENT_OTHER)

## 2015-03-21 VITALS — BP 98/60 | HR 100 | Temp 98.0°F | Resp 20

## 2015-03-21 DIAGNOSIS — R6889 Other general symptoms and signs: Secondary | ICD-10-CM

## 2015-03-21 DIAGNOSIS — Z23 Encounter for immunization: Secondary | ICD-10-CM | POA: Diagnosis not present

## 2015-03-21 DIAGNOSIS — C3492 Malignant neoplasm of unspecified part of left bronchus or lung: Secondary | ICD-10-CM

## 2015-03-21 DIAGNOSIS — Z452 Encounter for adjustment and management of vascular access device: Secondary | ICD-10-CM | POA: Diagnosis not present

## 2015-03-21 DIAGNOSIS — R42 Dizziness and giddiness: Secondary | ICD-10-CM

## 2015-03-21 DIAGNOSIS — C3490 Malignant neoplasm of unspecified part of unspecified bronchus or lung: Secondary | ICD-10-CM

## 2015-03-21 DIAGNOSIS — Z95828 Presence of other vascular implants and grafts: Secondary | ICD-10-CM

## 2015-03-21 DIAGNOSIS — R269 Unspecified abnormalities of gait and mobility: Secondary | ICD-10-CM

## 2015-03-21 MED ORDER — SODIUM CHLORIDE 0.9 % IJ SOLN
10.0000 mL | INTRAMUSCULAR | Status: DC | PRN
  Administered 2015-03-21: 10 mL via INTRAVENOUS
  Filled 2015-03-21: qty 10

## 2015-03-21 MED ORDER — INFLUENZA VAC SPLIT QUAD 0.5 ML IM SUSY
0.5000 mL | PREFILLED_SYRINGE | Freq: Once | INTRAMUSCULAR | Status: AC
  Administered 2015-03-21: 0.5 mL via INTRAMUSCULAR
  Filled 2015-03-21: qty 0.5

## 2015-03-21 MED ORDER — HEPARIN SOD (PORK) LOCK FLUSH 100 UNIT/ML IV SOLN
500.0000 [IU] | Freq: Once | INTRAVENOUS | Status: AC
  Administered 2015-03-21: 500 [IU] via INTRAVENOUS
  Filled 2015-03-21: qty 5

## 2015-03-21 MED ORDER — INFLUENZA VAC SPLIT QUAD 0.5 ML IM SUSP: 0.5000 mL | Freq: Once | INTRAMUSCULAR | Status: DC

## 2015-03-21 NOTE — Therapy (Signed)
[facility] [address] [place], [place], [address] Phone: [phone]   Fax:  [phone]  Physical Therapy Treatment  Patient Details  Name: [name] MRN: [id number] Date of Birth: [birth year] Referring Provider: Margette Fast, MD  Encounter Date: 03/21/2015      PT End of Session - 03/21/15 1301    Visit Number 3   Number of Visits 9   Date for PT Re-Evaluation 04/13/15   Authorization Type Medicare - GCodes required   PT Start Time 3474   PT Stop Time 1240   PT Time Calculation (min) 47 min   Activity Tolerance Other (comment);Patient limited by fatigue  Limited by dizziness/lightheadedness   Behavior During Therapy [facility] for tasks assessed/performed      Past Medical History  Diagnosis Date  . Hypertension   . Arthritis RIGHT SHOULDER  . Immature cataract BILATERAL  . Non-small cell lung cancer ([place]) DX SEPT 2010  W/ CHEMORADIATION AT THAT TIME -- NOW  W/ METS--  CURRENTLY ON MAINTENANCE CHEMO TX  EVERY 30 DAYS    ONCOLOGIST- DR [name]  . Acute meniscal tear of knee RIGHT KNEE  . BPH (benign prostatic hypertrophy)   . Dizziness and giddiness 04/23/2014  . Tremor 04/23/2014    Right hand  . HOH (hard of hearing)     bilateral hearing aids  . Radiation 01/08/09-01/21/09    Mediastinum 30 Gy x 12 fractions  . Radiation 08/19/14-09/04/14    Palliative RT Porta hepatic LN 30 Gy  . Pneumonia   . Abnormality of gait 03/12/2015    Past Surgical History  Procedure Laterality Date  . 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 Rotator cuff repair  2008    LEFT SHOULDER  . Bilateral ear drum surgery  1960'S  . Orif left ring finger  and revascularization of radial side  02-16-2007    DEGLOVING INJURY  . Knee arthroscopy  12/21/2011    Procedure: ARTHROSCOPY KNEE;  Surgeon: Tobi Bastos, MD;  Location: [facility];   Service: Orthopedics;  Laterality: Right;  WITH MEDIAL MENISECTOMY  . Cataract extraction Bilateral   . Tonsillectomy      as child  . Ovarian cyst surgery      removed from left jaw area- benign  . Ovarian cyst surgery       NOTE; pt unaware of why this is in his chart  . Shoulder open rotator cuff repair Right 06/05/2014    Procedure: RIGHT ROTATOR CUFF REPAIR SHOULDER OPEN;  Surgeon: Latanya Maudlin, MD;  Location: [facility];  Service: Orthopedics;  Laterality: Right;  . Ercp N/A 08/09/2014    Procedure: ENDOSCOPIC RETROGRADE CHOLANGIOPANCREATOGRAPHY (ERCP);  Surgeon: Carol Ada, MD;  Location: [facility];  Service: Endoscopy;  Laterality: N/A;  . Eus N/A 08/09/2014    Procedure: UPPER ENDOSCOPIC ULTRASOUND (EUS) RADIAL;  Surgeon: Carol Ada, MD;  Location: [facility];  Service: Endoscopy;  Laterality: N/A;  . Fine needle aspiration N/A 08/09/2014    Procedure: FINE NEEDLE ASPIRATION (FNA) LINEAR;  Surgeon: Carol Ada, MD;  Location: [facility];  Service: Endoscopy;  Laterality: N/A;  . Esophagogastroduodenoscopy (egd) with propofol N/A 08/09/2014    Procedure: ESOPHAGOGASTRODUODENOSCOPY (EGD) WITH PROPOFOL;  Surgeon: Carol Ada, MD;  Location: [facility];  Service: Endoscopy;  Laterality: N/A;  . Bile duct stent placement    . Gastrostomy tube placement  There were no vitals filed for this visit.  Visit Diagnosis:  Dizziness and giddiness  Abnormality of gait  Decreased functional activity tolerance      Subjective Assessment - 03/21/15 1156    Subjective Pt denied falls or changes since last visit. Pt reports dizzines is "the same," per pt. "He's been tolerating sitting up longer at home, which is great," per wife. Home exercises are goinng well, per pt/wife.   Patient is accompained by: Family member  wife, [name] and sister   Pertinent History metastatic lung cancer (Hospice)   Patient Stated Goals Pt: "To feel better. To walk without the walker"   Pt's wife: For  pt to tolerate being out of bed without being limited by dizziness/lightheadedness.   Currently in Pain? No/denies                         [facility] Adult PT Treatment/Exercise - 03/21/15 0001    Transfers   Transfers Sit to Stand;Stand to Sit   Sit to Stand 5: Supervision   Sit to Stand Details (indicate cue type and reason) x2 trials without AD and without UE use (hands on anterior thighs) followed by x1 trial with RW. Dizziness increased from 2/10 to 4/10 after 3 sit <> stands.   Stand to Sit 5: Supervision   Ambulation/Gait   Ambulation/Gait Yes   Ambulation/Gait Assistance 4: Min guard   Ambulation/Gait Assistance Details Gait x125' using RW for iniital 30', no AD for remainder of gait trial. Cueing focused on gaze fixation during turning, increased B hip/knee flexion during advancement of respective LE, and B reciprocal arm swing.   Ambulation Distance (Feet) 125 Feet  then x28' with RW   Assistive device Rolling walker;None   Gait Pattern Step-through pattern;Decreased arm swing - right;Decreased arm swing - left;Decreased stride length;Decreased trunk rotation;Wide base of support   Ambulation Surface Level;Indoor   Gait Comments Prior to gait, subjective symptoms 4-5/10; immediately following gait x125', subjective symptoms 2-3/10.   Self-Care   Self-Care Other Self-Care Comments   Other Self-Care Comments  During seated rest breaks, discussed impact of prolonged bedrest on vestibular and cardiovascular system. Suggested semi reclined (rather than supine) if pt feels too ill to fully sit up.                PT Education - 03/21/15 1249    Education provided Yes   Education Details Added sit <> stands, walking program to HEP. Explained impact of prolonged bedrest on vestibular and cardiovascular system.   Person(s) Educated Patient;Spouse   Methods Demonstration;Explanation;Handout   Comprehension Verbalized understanding;Returned demonstration           PT Short Term Goals - 03/14/15 1333    PT SHORT TERM GOAL #1   Title STG's = LTG's           PT Long Term Goals - 03/21/15 1215    PT LONG TERM GOAL #1   Title Pt will perform initial HEP with mod I using paper handout to maximize functional gains made in PT. Target date: 04/11/15   Status On-going   PT LONG TERM GOAL #2   Title Rule out orthostatic hypotension as origin of symptoms. Target date: 04/11/15   Status Achieved   PT LONG TERM GOAL #3   Title Pt/wife will report pt ablity to tolerate being upright (seated) for > 50% of 12-hour day. Target date: 04/11/15   Status On-going   PT LONG TERM GOAL #  4   Title Pt will ambulate 35' over level, indoor surfaces with supervision using LRAD with no more 2-point increase in subjective symptoms to indicate increased pt tolerance to household ambulation. Target date: 04/11/15   Baseline 12/23: gait x125' with min guard, initial 63' with RW, reamining distance without AD. Dizziness/lightheadedness decreased from 5/10 to 4/10.   Status On-going   PT LONG TERM GOAL #5   Title Pt will decrease DHI score from 76% to 58% to indicate significant decrease in pt-perceived disability due to symptoms of lightheadedness. Target date: 04/11/15   Status On-going               Plan - 03/21/15 1309    Clinical Impression Statement Skilled session focused on assessing gait, impact of ambulation on pt symptoms. Pt reports difficulty discerning difference between lightheadedness from vestibular system vs. drop in BP. Pt able to ambulate x125' consecutively (majority of trial without AD with min guard) and reported decreased subjective lightheadedness/dizziness from 4-5/10 to 2-3/10 with gait trial. Added sit <> stands to HEP and initiated walking program. Instructed pt/wife to decrease frequency and notify PT if pt has difficulty tolerating.   Pt will benefit from skilled therapeutic intervention in order to improve on the following deficits  Dizziness;Decreased activity tolerance;Abnormal gait;Decreased mobility   Clinical Impairments Affecting Rehab Potential Pt has metastatic lung cancer and is currently on Hospice care, per chart   PT Frequency 2x / week   PT Duration 4 weeks   PT Treatment/Interventions ADLs/Self Care Home Management;Canalith Repostioning;DME Instruction;Gait training;Stair training;Functional mobility training;Therapeutic activities;Therapeutic exercise;Balance training;Vestibular;Neuromuscular re-education;Patient/family education   PT Next Visit Plan Consider gait with rollator for energy conservation (and pt fatigues quickly, needs to sit suddenty). May want to add head turns, corner balance to HEP.  Ask about tolerance to upright and walking.   PT Home Exercise Plan See Pt Instructions from 12/23.   Consulted and Agree with Plan of Care Patient;Family member/caregiver   Family Member Consulted wife, [name]        Problem List Patient Active Problem List   Diagnosis Date Noted  . Abnormality of gait 03/12/2015  . Lung cancer ([place])   . Protein-calorie malnutrition, severe ([place]) 11/26/2014  . Hypoalbuminemia 11/18/2014  . Leukocytosis 11/18/2014  . Dysphagia 11/18/2014  . Nausea with vomiting 10/03/2014  . Central line complication 26/94/8546  . Anorexia 10/03/2014  . Weight loss 10/03/2014  . Transaminitis 10/03/2014  . Hyperphosphatemia 10/03/2014  . Encounter for antineoplastic immunotherapy 09/28/2014  . Non-small cell lung cancer ([place]) 09/21/2014  . Abdominal pain   . Fever   . 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 Biliary obstruction   . Fatigue 08/27/2014  . Abdominal pain, acute   . Abdominal mass   . Abdominal pain, generalized 08/07/2014  . CRF (chronic renal failure) 08/07/2014  . Obstructive jaundice 08/07/2014  . Malnutrition of moderate degree ([place]) 08/07/2014  . Obstructive jaundice due to cancer 08/06/2014  . Rotator cuff tear, non-traumatic  06/05/2014  . Dizziness and giddiness 04/23/2014  . Tremor 04/23/2014  . Acute on chronic renal failure ([place]) 05/24/2013  . Pancytopenia ([place]) 05/24/2013  . Osteoarthritis of right knee 12/21/2011  . Meniscus, medial, posterior horn derangement 12/21/2011  . Dyspnea 12/04/2010  . Bronchogenic cancer of left lung ([place]) 12/11/2008  . ARTHRITIS 12/10/2008    Billie Ruddy, PT, DPT [facility] [address] [place], [place], [address] Phone: [phone]   Fax:  [phone] 03/21/2015,  1:18 PM   Name: Damacio Weisgerber MRN: [id number] Date of Birth: 1945/11/06

## 2015-03-21 NOTE — Patient Instructions (Signed)
Tip Card 1.The goal of habituation training is to assist in decreasing symptoms of vertigo, dizziness, or nausea provoked by specific head and body motions. 2.These exercises may initially increase symptoms; however, be persistent and work through symptoms. With repetition and time, the exercises will assist in reducing or eliminating symptoms. 3.Exercises should be stopped and discussed with the therapist if you experience any of the following: - Sudden change or fluctuation in hearing - New onset of ringing in the ears, or increase in current intensity - Any fluid discharge from the ear - Severe pain in neck or back - Extreme nausea. If dizziness increases by 3 or more from baseline OR if dizziness increases to > 5/10.  Copyright  VHI. All rights reserved.  Sit to Side-Lying   Sit on edge of bed. Lie down onto the right side and hold until dizziness stops, plus 20 seconds. Return to sitting and wait until dizziness stops, plus 20 seconds. Repeat to the left side. Repeat sequence 5 times per session. Do 2 sessions per day.  Copyright  VHI. All rights reserved.  Gaze Stabilization: Tip Card 1.Target must remain in focus, not blurry, and appear stationary while head is in motion. 2.Perform exercises with small head movements (45 to either side of midline). 3.Increase speed of head motion so long as target is in focus. 4.If you wear eyeglasses, be sure you can see target through lens (therapist will give specific instructions for bifocal / progressive lenses). 5.These exercises may provoke dizziness or nausea. Work through these symptoms. If too dizzy, slow head movement slightly. Rest between each exercise. 6.Exercises demand concentration; avoid distractions. 7.For safety, perform standing exercises close to a counter, wall, corner, or next to someone.  Gaze Stabilization: Sitting    Keeping eyes on target on wall 5-6 feet away, tilt head down 15-30 and move head side to side for  _30___ seconds. Repeat while moving head up and down for _30___ seconds. Do __2__ sessions per day.  Stand to Sit / Sit to Stand    With [name] standing next to you and with your walker in front of you, stand up 3 times while staring at a target on the wall. Try to do this 3 times per day. If you're able to consistently perform 3 times per day, increase to 4 then 5 reps, 3 times per day.   Using your walker, try to walk around the kitchen 2-3 times per day with [name] helping you.

## 2015-03-21 NOTE — Patient Instructions (Signed)
Implanted Port Home Guide An implanted port is a type of central line that is placed under the skin. Central lines are used to provide IV access when treatment or nutrition needs to be given through a person's veins. Implanted ports are used for long-term IV access. An implanted port may be placed because:   You need IV medicine that would be irritating to the small veins in your hands or arms.   You need long-term IV medicines, such as antibiotics.   You need IV nutrition for a long period.   You need frequent blood draws for lab tests.   You need dialysis.  Implanted ports are usually placed in the chest area, but they can also be placed in the upper arm, the abdomen, or the leg. An implanted port has two main parts:   Reservoir. The reservoir is round and will appear as a small, raised area under your skin. The reservoir is the part where a needle is inserted to give medicines or draw blood.   Catheter. The catheter is a thin, flexible tube that extends from the reservoir. The catheter is placed into a large vein. Medicine that is inserted into the reservoir goes into the catheter and then into the vein.  HOW WILL I CARE FOR MY INCISION SITE? Do not get the incision site wet. Bathe or shower as directed by your health care provider.  HOW IS MY PORT ACCESSED? Special steps must be taken to access the port:   Before the port is accessed, a numbing cream can be placed on the skin. This helps numb the skin over the port site.   Your health care provider uses a sterile technique to access the port.  Your health care provider must put on a mask and sterile gloves.  The skin over your port is cleaned carefully with an antiseptic and allowed to dry.  The port is gently pinched between sterile gloves, and a needle is inserted into the port.  Only "non-coring" port needles should be used to access the port. Once the port is accessed, a blood return should be checked. This helps  ensure that the port is in the vein and is not clogged.   If your port needs to remain accessed for a constant infusion, a clear (transparent) bandage will be placed over the needle site. The bandage and needle will need to be changed every week, or as directed by your health care provider.   Keep the bandage covering the needle clean and dry. Do not get it wet. Follow your health care provider's instructions on how to take a shower or bath while the port is accessed.   If your port does not need to stay accessed, no bandage is needed over the port.  WHAT IS FLUSHING? Flushing helps keep the port from getting clogged. Follow your health care provider's instructions on how and when to flush the port. Ports are usually flushed with saline solution or a medicine called heparin. The need for flushing will depend on how the port is used.   If the port is used for intermittent medicines or blood draws, the port will need to be flushed:   After medicines have been given.   After blood has been drawn.   As part of routine maintenance.   If a constant infusion is running, the port may not need to be flushed.  HOW LONG WILL MY PORT STAY IMPLANTED? The port can stay in for as long as your health care   provider thinks it is needed. When it is time for the port to come out, surgery will be done to remove it. The procedure is similar to the one performed when the port was put in.  WHEN SHOULD I SEEK IMMEDIATE MEDICAL CARE? When you have an implanted port, you should seek immediate medical care if:   You notice a bad smell coming from the incision site.   You have swelling, redness, or drainage at the incision site.   You have more swelling or pain at the port site or the surrounding area.   You have a fever that is not controlled with medicine. Document Released: 03/15/2005 Document Revised: 01/03/2013 Document Reviewed: 11/20/2012 [facility] Patient Information 2015 [place], [place]. This  information is not intended to replace advice given to you by your health care provider. Make sure you discuss any questions you have with your health care provider. Influenza Virus Vaccine (Flucelvax) What is this medicine? INFLUENZA VIRUS VACCINE (in floo EN zuh VAHY ruhs vak SEEN) helps to reduce the risk of getting influenza also known as the flu. The vaccine only helps protect you against some strains of the flu. This medicine may be used for other purposes; ask your health care provider or pharmacist if you have questions. What should I tell my health care provider before I take this medicine? They need to know if you have any of these conditions: -bleeding disorder like hemophilia -fever or infection -Guillain-Barre syndrome or other neurological problems -immune system problems -infection with the human immunodeficiency virus (HIV) or AIDS -low blood platelet counts -multiple sclerosis -an unusual or allergic reaction to influenza virus vaccine, other medicines, foods, dyes or preservatives -pregnant or trying to get pregnant -breast-feeding How should I use this medicine? This vaccine is for injection into a muscle. It is given by a health care professional. A copy of Vaccine Information Statements will be given before each vaccination. Read this sheet carefully each time. The sheet may change frequently. Talk to your pediatrician regarding the use of this medicine in children. Special care may be needed. Overdosage: If you think you've taken too much of this medicine contact a poison control center or emergency room at once. Overdosage: If you think you have taken too much of this medicine contact a poison control center or emergency room at once. NOTE: This medicine is only for you. Do not share this medicine with others. What if I miss a dose? This does not apply. What may interact with this medicine? -chemotherapy or radiation therapy -medicines that lower your immune system  like etanercept, anakinra, infliximab, and adalimumab -medicines that treat or prevent blood clots like warfarin -phenytoin -steroid medicines like prednisone or cortisone -theophylline -vaccines This list may not describe all possible interactions. Give your health care provider a list of all the medicines, herbs, non-prescription drugs, or dietary supplements you use. Also tell them if you smoke, drink alcohol, or use illegal drugs. Some items may interact with your medicine. What should I watch for while using this medicine? Report any side effects that do not go away within 3 days to your doctor or health care professional. Call your health care provider if any unusual symptoms occur within 6 weeks of receiving this vaccine. You may still catch the flu, but the illness is not usually as bad. You cannot get the flu from the vaccine. The vaccine will not protect against colds or other illnesses that may cause fever. The vaccine is needed every year. What side effects  may I notice from receiving this medicine? Side effects that you should report to your doctor or health care professional as soon as possible: -allergic reactions like skin rash, itching or hives, swelling of the face, lips, or tongue Side effects that usually do not require medical attention (Report these to your doctor or health care professional if they continue or are bothersome.): -fever -headache -muscle aches and pains -pain, tenderness, redness, or swelling at the injection site -tiredness This list may not describe all possible side effects. Call your doctor for medical advice about side effects. You may report side effects to FDA at 1-800-FDA-1088. Where should I keep my medicine? The vaccine will be given by a health care professional in a clinic, pharmacy, doctor's office, or other health care setting. You will not be given vaccine doses to store at home. NOTE: This sheet is a summary. It may not cover all possible  information. If you have questions about this medicine, talk to your doctor, pharmacist, or health care provider.    2016, Elsevier/Gold Standard. (2011-02-24 14:06:47)

## 2015-03-26 ENCOUNTER — Other Ambulatory Visit: Payer: Self-pay | Admitting: Nurse Practitioner

## 2015-03-26 ENCOUNTER — Ambulatory Visit: Payer: Medicare Other | Admitting: Physical Therapy

## 2015-03-26 DIAGNOSIS — R42 Dizziness and giddiness: Secondary | ICD-10-CM

## 2015-03-26 DIAGNOSIS — R6889 Other general symptoms and signs: Secondary | ICD-10-CM

## 2015-03-26 DIAGNOSIS — R269 Unspecified abnormalities of gait and mobility: Secondary | ICD-10-CM

## 2015-03-26 NOTE — Therapy (Signed)
[facility] [address] [place], [place], [address] Phone: [phone]   Fax:  [phone]  Physical Therapy Treatment  Patient Details  Name: [name] MRN: [id number] Date of Birth: 1945/12/29 Referring Provider: Margette Fast, MD  Encounter Date: 03/26/2015      PT End of Session - 03/26/15 1737    Visit Number 4   Number of Visits 9   Date for PT Re-Evaluation 04/13/15   Authorization Type Medicare - GCodes required   PT Start Time 5053   PT Stop Time 1430   PT Time Calculation (min) 26 min   Activity Tolerance Other (comment)  limited by low BP and lightheadedness with standing   Behavior During Therapy [facility] for tasks assessed/performed      Past Medical History  Diagnosis Date  . Hypertension   . Arthritis RIGHT SHOULDER  . Immature cataract BILATERAL  . Non-small cell lung cancer ([place]) DX SEPT 2010  W/ CHEMORADIATION AT THAT TIME -- NOW  W/ METS--  CURRENTLY ON MAINTENANCE CHEMO TX  EVERY 30 DAYS    ONCOLOGIST- DR [name]  . Acute meniscal tear of knee RIGHT KNEE  . BPH (benign prostatic hypertrophy)   . Dizziness and giddiness 04/23/2014  . Tremor 04/23/2014    Right hand  . HOH (hard of hearing)     bilateral hearing aids  . Radiation 01/08/09-01/21/09    Mediastinum 30 Gy x 12 fractions  . Radiation 08/19/14-09/04/14    Palliative RT Porta hepatic LN 30 Gy  . Pneumonia   . Abnormality of gait 03/12/2015    Past Surgical History  Procedure Laterality Date  . 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 Rotator cuff repair  2008    LEFT SHOULDER  . Bilateral ear drum surgery  1960'S  . Orif left ring finger  and revascularization of radial side  02-16-2007    DEGLOVING INJURY  . Knee arthroscopy  12/21/2011    Procedure: ARTHROSCOPY KNEE;  Surgeon: Tobi Bastos, MD;  Location: [facility];  Service:  Orthopedics;  Laterality: Right;  WITH MEDIAL MENISECTOMY  . Cataract extraction Bilateral   . Tonsillectomy      as child  . Ovarian cyst surgery      removed from left jaw area- benign  . Ovarian cyst surgery       NOTE; pt unaware of why this is in his chart  . Shoulder open rotator cuff repair Right 06/05/2014    Procedure: RIGHT ROTATOR CUFF REPAIR SHOULDER OPEN;  Surgeon: Latanya Maudlin, MD;  Location: [facility];  Service: Orthopedics;  Laterality: Right;  . Ercp N/A 08/09/2014    Procedure: ENDOSCOPIC RETROGRADE CHOLANGIOPANCREATOGRAPHY (ERCP);  Surgeon: Carol Ada, MD;  Location: [facility];  Service: Endoscopy;  Laterality: N/A;  . Eus N/A 08/09/2014    Procedure: UPPER ENDOSCOPIC ULTRASOUND (EUS) RADIAL;  Surgeon: Carol Ada, MD;  Location: [facility];  Service: Endoscopy;  Laterality: N/A;  . Fine needle aspiration N/A 08/09/2014    Procedure: FINE NEEDLE ASPIRATION (FNA) LINEAR;  Surgeon: Carol Ada, MD;  Location: [facility];  Service: Endoscopy;  Laterality: N/A;  . Esophagogastroduodenoscopy (egd) with propofol N/A 08/09/2014    Procedure: ESOPHAGOGASTRODUODENOSCOPY (EGD) WITH PROPOFOL;  Surgeon: Carol Ada, MD;  Location: [facility];  Service: Endoscopy;  Laterality: N/A;  . Bile duct stent placement    . Gastrostomy tube placement  There were no vitals filed for this visit.  Visit Diagnosis:  Dizziness and giddiness  Abnormality of gait  Decreased functional activity tolerance      Subjective Assessment - 03/26/15 1410    Subjective Pt/wife were tired most of last week with Christmas holiday. Therefore, pt/wife unable to perform home exercises consistently and "walking didn't really happen," per wife. Pt has appt with primary care physician next week to address orthostatic hypotension.   Patient is accompained by: Family member  wife, [name]   Pertinent History metastatic lung cancer ([facility]osp[facility])   Patient Stated Goals Pt: "To feel better. To walk without  the walker"   Pt's wife: For pt to tolerate being out of bed without being limited by dizziness/lightheadedness.   Currently in Pain? No/denies                Vestibular Assessment - 03/26/15 1421    Orthostatics   BP sitting 111/76 mmHg   HR sitting 95   BP standing (after 1 minute) 91/60 mmHg  while performing mini squats in standing; lightheaded   HR standing (after 1 minute) 100                 [facility] Adult PT Treatment/Exercise - 03/26/15 0001    Transfers   Transfers Sit to Stand;Stand to Sit   Sit to Stand 5: Supervision   Sit to Stand Details (indicate cue type and reason) with RW   Stand to Sit 5: Supervision   Stand to Sit Details with RW   Ambulation/Gait   Ambulation/Gait Yes   Ambulation/Gait Assistance 4: Min guard   Ambulation/Gait Assistance Details Trial ended after 20' due to pt-perceived weakness and lightheadedness. Upon pt returning to seated, checked BP in seated and standing. See Vestibular Assessment for details.   Ambulation Distance (Feet) 20 Feet   Assistive device None   Gait Pattern Step-through pattern;Decreased arm swing - right;Decreased arm swing - left;Decreased stride length;Decreased trunk rotation;Wide base of support   Ambulation Surface Level;Indoor   Self-Care   Self-Care Other Self-Care Comments   Other Self-Care Comments  Educated pt and wife on the following: PT decision to hold PT until after pt sees primary MD to address orthostatic hypotensiojn due to 1) difficulty discerning lightheadedness caused by hypotension vs. vestibular impairments; 2) safety concerns with standing/walking, both of which are focus of PT. Discussed strategies for preventing, managing orthostatic hypotension with focus on hydration, postural adjustment, compression garments, and performance of LE therex. Emphasized importance of static standing > 10 seconds prior to initiating ambulation to decrease fall risk. Pt/wife verbalized understanding. Wife  agreeable to contacting this PT with MD recommendations after MD visit on 1/4.                 PT Education - 03/26/15 1443    Education provided Yes   Education Details Recomending to hold PT until after MD appt (04/02/15) due to ongoing orthostatic hypotension. Also recommending pt/wife continue gaze stabilization but stop performing sit <> sidelying and sit <> stand HEP.   Person(s) Educated Patient;Spouse   Methods Explanation   Comprehension Verbalized understanding          PT Short Term Goals - 03/14/15 1333    PT SHORT TERM GOAL #1   Title STG's = LTG's           PT Long Term Goals - 03/21/15 1215    PT LONG TERM GOAL #1   Title Pt will perform initial HEP  with mod I using paper handout to maximize functional gains made in PT. Target date: 04/11/15   Status On-going   PT LONG TERM GOAL #2   Title Rule out orthostatic hypotension as origin of symptoms. Target date: 04/11/15   Status Achieved   PT LONG TERM GOAL #3   Title Pt/wife will report pt ablity to tolerate being upright (seated) for > 50% of 12-hour day. Target date: 04/11/15   Status On-going   PT LONG TERM GOAL #4   Title Pt will ambulate 46' over level, indoor surfaces with supervision using LRAD with no more 2-point increase in subjective symptoms to indicate increased pt tolerance to household ambulation. Target date: 04/11/15   Baseline 12/23: gait x125' with min guard, initial 31' with RW, reamining distance without AD. Dizziness/lightheadedness decreased from 5/10 to 4/10.   Status On-going   PT LONG TERM GOAL #5   Title Pt will decrease DHI score from 76% to 58% to indicate significant decrease in pt-perceived disability due to symptoms of lightheadedness. Target date: 04/11/15   Status On-going               Plan - 03/26/15 1746    Clinical Impression Statement Pt continues to report symptoms of lightheadedness and pt-perceived leg "weakness" with standing and walking. Assessment of vital  signs revealed symptomatic decrease in BP from  111/76 in seated to 91/60 in standing. Because standing/walking tolerance is a focus of therapy, recommending to hold PT until pt sees primary MD on 1/4 to address orthostatic hypotension. Provided education on the impact of hydration, compressive garments, and LE exercise on orthostatic hypotension. Wife agreeable to contacting this PT with MD recommendations prior to next PT visit on 1/5.    Pt will benefit from skilled therapeutic intervention in order to improve on the following deficits Dizziness;Decreased activity tolerance;Abnormal gait;Decreased mobility   Rehab Potential Good   Clinical Impairments Affecting Rehab Potential Pt has metastatic lung cancer and is currently on Hospice care, per chart   PT Frequency 2x / week   PT Duration 4 weeks   PT Treatment/Interventions ADLs/Self Care Home Management;Canalith Repostioning;DME Instruction;Gait training;Stair training;Functional mobility training;Therapeutic activities;Therapeutic exercise;Balance training;Vestibular;Neuromuscular re-education;Patient/family education   PT Next Visit Plan Modify HEP based on stability of BP and pt symptoms with positional change. Consider gait with rollator for energy conservation (and pt fatigues quickly, needs to sit suddenty).   Consulted and Agree with Plan of Care Patient;Family member/caregiver   Family Member Consulted wife, [name]        Problem List Patient Active Problem List   Diagnosis Date Noted  . Abnormality of gait 03/12/2015  . Lung cancer ([place])   . Protein-calorie malnutrition, severe ([place]) 11/26/2014  . Hypoalbuminemia 11/18/2014  . Leukocytosis 11/18/2014  . Dysphagia 11/18/2014  . Nausea with vomiting 10/03/2014  . Central line complication 17/61/6073  . Anorexia 10/03/2014  . Weight loss 10/03/2014  . Transaminitis 10/03/2014  . Hyperphosphatemia 10/03/2014  . Encounter for antineoplastic immunotherapy 09/28/2014  . Non-small  cell lung cancer ([place]) 09/21/2014  . Abdominal pain   . Fever   . 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 Biliary obstruction   . Fatigue 08/27/2014  . Abdominal pain, acute   . Abdominal mass   . Abdominal pain, generalized 08/07/2014  . CRF (chronic renal failure) 08/07/2014  . Obstructive jaundice 08/07/2014  . Malnutrition of moderate degree ([place]) 08/07/2014  . Obstructive jaundice due to cancer 08/06/2014  . Rotator cuff tear,  non-traumatic 06/05/2014  . Dizziness and giddiness 04/23/2014  . Tremor 04/23/2014  . Acute on chronic renal failure ([place]) 05/24/2013  . Pancytopenia ([place]) 05/24/2013  . Osteoarthritis of right knee 12/21/2011  . Meniscus, medial, posterior horn derangement 12/21/2011  . Dyspnea 12/04/2010  . Bronchogenic cancer of left lung ([place]) 12/11/2008  . ARTHRITIS 12/10/2008   Billie Ruddy, PT, DPT [facility] [address] [place], [place], [address] Phone: [phone]   Fax:  [phone] 03/26/2015, 5:52 PM   Name: Schawn Byas MRN: [id number] Date of Birth: 31-Jul-1945

## 2015-03-27 ENCOUNTER — Ambulatory Visit: Payer: Medicare Other | Admitting: Physical Therapy

## 2015-03-27 ENCOUNTER — Telehealth: Payer: Self-pay | Admitting: Nutrition

## 2015-03-27 NOTE — Telephone Encounter (Signed)
Return phone call to patient's wife [name]. Wife requested I review Tube feeding for adequate salt/sodium. She reports patient has been orthostatic and wonders if tube feeding has adequate sodium. Patient has not had any labs drawn since September. Patient has been receiving 4 cans of Osmolite 1.5 and 2 cans TwoCal HN to provide 2350 cal and 2010 mg sodium. Patient has been receiving 800 cc free water flushes daily. Total free water approximately 1856 mL daily. Recommended patient increase free water flushes or water by mouth by an additional 720 cc a day to provide approximately 2576 mL free water daily. Encouraged patient to contact physician for further recommendations.

## 2015-03-30 DIAGNOSIS — N179 Acute kidney failure, unspecified: Secondary | ICD-10-CM

## 2015-03-30 DIAGNOSIS — A419 Sepsis, unspecified organism: Secondary | ICD-10-CM

## 2015-03-30 HISTORY — DX: Sepsis, unspecified organism: A41.9

## 2015-03-30 HISTORY — DX: Acute kidney failure, unspecified: N17.9

## 2015-04-03 ENCOUNTER — Ambulatory Visit: Payer: Medicare Other | Attending: Neurology

## 2015-04-03 VITALS — BP 107/79 | HR 100

## 2015-04-03 DIAGNOSIS — R6889 Other general symptoms and signs: Secondary | ICD-10-CM | POA: Diagnosis present

## 2015-04-03 DIAGNOSIS — R42 Dizziness and giddiness: Secondary | ICD-10-CM | POA: Diagnosis present

## 2015-04-03 DIAGNOSIS — R269 Unspecified abnormalities of gait and mobility: Secondary | ICD-10-CM | POA: Diagnosis present

## 2015-04-03 NOTE — Therapy (Signed)
[facility] [address] [place], [place], [address] Phone: [phone]   Fax:  [phone]  Physical Therapy Treatment  Patient Details  Name: [name] MRN: [id number] Date of Birth: 23-Feb-1946 Referring Provider: Margette Fast, MD  Encounter Date: 04/03/2015      PT End of Session - 04/03/15 1154    Visit Number 5   Number of Visits 9   Date for PT Re-Evaluation 04/13/15   Authorization Type Medicare - GCodes required   PT Start Time 1102   PT Stop Time 1146   PT Time Calculation (min) 44 min   Equipment Utilized During Treatment Gait belt   Activity Tolerance Patient tolerated treatment well   Behavior During Therapy [facility] for tasks assessed/performed      Past Medical History  Diagnosis Date  . Hypertension   . Arthritis RIGHT SHOULDER  . Immature cataract BILATERAL  . Non-small cell lung cancer ([place]) DX SEPT 2010  W/ CHEMORADIATION AT THAT TIME -- NOW  W/ METS--  CURRENTLY ON MAINTENANCE CHEMO TX  EVERY 30 DAYS    ONCOLOGIST- DR [name]  . Acute meniscal tear of knee RIGHT KNEE  . BPH (benign prostatic hypertrophy)   . Dizziness and giddiness 04/23/2014  . Tremor 04/23/2014    Right hand  . HOH (hard of hearing)     bilateral hearing aids  . Radiation 01/08/09-01/21/09    Mediastinum 30 Gy x 12 fractions  . Radiation 08/19/14-09/04/14    Palliative RT Porta hepatic LN 30 Gy  . Pneumonia   . Abnormality of gait 03/12/2015    Past Surgical History  Procedure Laterality Date  . 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 Rotator cuff repair  2008    LEFT SHOULDER  . Bilateral ear drum surgery  1960'S  . Orif left ring finger  and revascularization of radial side  02-16-2007    DEGLOVING INJURY  . Knee arthroscopy  12/21/2011    Procedure: ARTHROSCOPY KNEE;  Surgeon: Tobi Bastos, MD;  Location: [facility];   Service: Orthopedics;  Laterality: Right;  WITH MEDIAL MENISECTOMY  . Cataract extraction Bilateral   . Tonsillectomy      as child  . Ovarian cyst surgery      removed from left jaw area- benign  . Ovarian cyst surgery       NOTE; pt unaware of why this is in his chart  . Shoulder open rotator cuff repair Right 06/05/2014    Procedure: RIGHT ROTATOR CUFF REPAIR SHOULDER OPEN;  Surgeon: Latanya Maudlin, MD;  Location: [facility];  Service: Orthopedics;  Laterality: Right;  . Ercp N/A 08/09/2014    Procedure: ENDOSCOPIC RETROGRADE CHOLANGIOPANCREATOGRAPHY (ERCP);  Surgeon: Carol Ada, MD;  Location: [facility];  Service: Endoscopy;  Laterality: N/A;  . Eus N/A 08/09/2014    Procedure: UPPER ENDOSCOPIC ULTRASOUND (EUS) RADIAL;  Surgeon: Carol Ada, MD;  [facility];  Service: Endoscopy;  Laterality: N/A;  . Fine needle aspiration N/A 08/09/2014    Procedure: FINE NEEDLE ASPIRATION (FNA) LINEAR;  Surgeon: Carol Ada, MD;  [facility]: [facility];  Service: Endoscopy;  Laterality: N/A;  . Esophagogastroduodenoscopy (egd) with propofol N/A 08/09/2014    Procedure: ESOPHAGOGASTRODUODENOSCOPY (EGD) WITH PROPOFOL;  Surgeon: Carol Ada, MD;  [facility];  Service: Endoscopy;  Laterality: N/A;  . Bile duct stent placement    . Gastrostomy tube placement  Filed Vitals:   04/03/15 1201 04/03/15 1202 04/03/15 1203  BP: 122/72 85/65 107/79  Pulse:  101 100  Vitals first reading in sitting, second reading while standing and third reading after amb.   Visit Diagnosis:  Dizziness and giddiness  Abnormality of gait  Decreased functional activity tolerance      Subjective Assessment - 04/03/15 1106    Subjective Pt's wife reported dietician increased pt's fluid intake last Friday and pt saw PCP yesterday and PCP added a new medication to incr. BP, and this has increased BP and pt feels a little better.    Patient is accompained by: Family member  wife-[name]   Pertinent  History metastatic lung cancer (Hospice)   Patient Stated Goals Pt: "To feel better. To walk without the walker"   Pt's wife: For pt to tolerate being out of bed without being limited by dizziness/lightheadedness.   Currently in Pain? No/denies        Neuro re-ed: Pt reviewed and performed gaze stab. (standing) and sit to sidelying HEP. Please see pt instructions for details. Cues for technique. Pt tolerated HEP well.                 [place] Adult PT Treatment/Exercise - 04/03/15 1109    Ambulation/Gait   Ambulation/Gait Yes   Ambulation/Gait Assistance 5: Supervision;4: Min guard   Ambulation/Gait Assistance Details Pt amb. with rollator with cues for upright posture, look straight ahead, improve stride length and heel strike.   Ambulation Distance (Feet) --  290'x2   Assistive device Rollator   Gait Pattern Step-through pattern;Decreased stride length;Decreased trunk rotation   Ambulation Surface Level;Indoor                PT Education - 04/03/15 1151    Education provided Yes   Education Details PT discussed the importance of increasing endurance by walking longer distances, now that orthostatic hypotension symptoms are decreased. PT discussed the benefits of using rollator vs. RW.  PT encouraged pt to resume sit to sidelying HEP to decr. dizziness, as pt reports he's able to tolerate it and his wife feels that the habiltuation was helpful to decr. dizziness.   Person(s) Educated Patient;Spouse   Methods Explanation;Demonstration  pt declined handout of progressing gaze stab. HEP from sitting to standing.    Comprehension Verbalized understanding;Returned demonstration          PT Short Term Goals - 03/14/15 1333    PT SHORT TERM GOAL #1   Title STG's = LTG's           PT Long Term Goals - 03/21/15 1215    PT LONG TERM GOAL #1   Title Pt will perform initial HEP with mod I using paper handout to maximize functional gains made in PT. Target date:  04/11/15   Status On-going   PT LONG TERM GOAL #2   Title Rule out orthostatic hypotension as origin of symptoms. Target date: 04/11/15   Status Achieved   PT LONG TERM GOAL #3   Title Pt/wife will report pt ablity to tolerate being upright (seated) for > 50% of 12-hour day. Target date: 04/11/15   Status On-going   PT LONG TERM GOAL #4   Title Pt will ambulate 31' over level, indoor surfaces with supervision using LRAD with no more 2-point increase in subjective symptoms to indicate increased pt tolerance to household ambulation. Target date: 04/11/15   Baseline 12/23: gait x125' with min guard, initial 30' with RW, reamining distance without  AD. Dizziness/lightheadedness decreased from 5/10 to 4/10.   Status On-going   PT LONG TERM GOAL #5   Title Pt will decrease DHI score from 76% to 58% to indicate significant decrease in pt-perceived disability due to symptoms of lightheadedness. Target date: 04/11/15   Status On-going               Plan - 04/03/15 1154    Clinical Impression Statement Pt demonstrated progress, as he was able to ambulate longer distances with rollator, without incr. in lightheadedness. Pt's BP did decr. significantly during transfer from sit to stand, however, pt did not experience lightheadedness. Therefore, PT had pt wait approx. 30 seconds prior to amb. with rollator.  Pt was also able to progress gaze stab. HEP from seated position to standing. Pt required seated rest breaks during session, 2/2 fatigue. Continue with POC.   Pt will benefit from skilled therapeutic intervention in order to improve on the following deficits Dizziness;Decreased activity tolerance;Abnormal gait;Decreased mobility   Rehab Potential Good   Clinical Impairments Affecting Rehab Potential Pt has metastatic lung cancer and is currently on Hospice care, per chart   PT Frequency 2x / week   PT Duration 4 weeks   PT Treatment/Interventions ADLs/Self Care Home Management;Canalith  Repostioning;DME Instruction;Gait training;Stair training;Functional mobility training;Therapeutic activities;Therapeutic exercise;Balance training;Vestibular;Neuromuscular re-education;Patient/family education   PT Next Visit Plan Send request for rollator prescription to MD, if pt and pt's wife wish to pursue rollator. Trial functional strengthening activities and balance activities as tolerated.   PT Home Exercise Plan See Pt Instructions from 12/23.   Consulted and Agree with Plan of Care Patient;Family member/caregiver   Family Member Consulted wife, [name]        Problem List Patient Active Problem List   Diagnosis Date Noted  . Abnormality of gait 03/12/2015  . Lung cancer ([place])   . Protein-calorie malnutrition, severe ([place]) 11/26/2014  . Hypoalbuminemia 11/18/2014  . Leukocytosis 11/18/2014  . Dysphagia 11/18/2014  . Nausea with vomiting 10/03/2014  . Central line complication 64/68/0321  . Anorexia 10/03/2014  . Weight loss 10/03/2014  . Transaminitis 10/03/2014  . Hyperphosphatemia 10/03/2014  . Encounter for antineoplastic immunotherapy 09/28/2014  . Non-small cell lung cancer ([place]) 09/21/2014  . Abdominal pain   . Fever   . 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 Biliary obstruction   . Fatigue 08/27/2014  . Abdominal pain, acute   . Abdominal mass   . Abdominal pain, generalized 08/07/2014  . CRF (chronic renal failure) 08/07/2014  . Obstructive jaundice 08/07/2014  . Malnutrition of moderate degree ([place]) 08/07/2014  . Obstructive jaundice due to cancer 08/06/2014  . Rotator cuff tear, non-traumatic 06/05/2014  . Dizziness and giddiness 04/23/2014  . Tremor 04/23/2014  . Acute on chronic renal failure ([place]) 05/24/2013  . Pancytopenia ([place]) 05/24/2013  . Osteoarthritis of right knee 12/21/2011  . Meniscus, medial, posterior horn derangement 12/21/2011  . Dyspnea 12/04/2010  . Bronchogenic cancer of left lung ([place]) 12/11/2008  .  ARTHRITIS 12/10/2008    Leotis Isham L 04/03/2015, 12:03 PM  [facility] [address] [place], [place], [address] Phone: [phone]   Fax:  [phone]  Name: [name] MRN: [id number] Date of Birth: 11-Mar-1946    Geoffry Paradise, PT,DPT 04/03/2015 12:03 PM Phone: [phone] Fax: [phone]

## 2015-04-03 NOTE — Patient Instructions (Signed)
Sit to Side-Lying   Sit on edge of bed. Lie down onto the right side and hold until dizziness stops, plus 20 seconds. Return to sitting and wait until dizziness stops, plus 20 seconds. Repeat to the left side. Repeat sequence 5 times per session. Do 2 sessions per day.  Copyright  VHI. All rights reserved.  Gaze Stabilization: Tip Card 1.Target must remain in focus, not blurry, and appear stationary while head is in motion. 2.Perform exercises with small head movements (45 to either side of midline). 3.Increase speed of head motion so long as target is in focus. 4.If you wear eyeglasses, be sure you can see target through lens (therapist will give specific instructions for bifocal / progressive lenses). 5.These exercises may provoke dizziness or nausea. Work through these symptoms. If too dizzy, slow head movement slightly. Rest between each exercise. 6.Exercises demand concentration; avoid distractions. 7.For safety, perform standing exercises close to a counter, wall, corner, or next to someone.  Gaze Stabilization: STANDING    Keeping eyes on target on wall 5-6 feet away, tilt head down 15-30 and move head side to side for _30___ seconds. Repeat while moving head up and down for _30___ seconds. Do __2__ sessions per day. PROGRESS NOW TO STANDING.

## 2015-04-07 ENCOUNTER — Ambulatory Visit: Payer: Medicare Other | Admitting: Physical Therapy

## 2015-04-09 ENCOUNTER — Ambulatory Visit: Payer: Medicare Other | Admitting: Physical Therapy

## 2015-04-10 ENCOUNTER — Ambulatory Visit: Payer: Medicare Other

## 2015-04-10 ENCOUNTER — Telehealth: Payer: Self-pay

## 2015-04-10 DIAGNOSIS — R6889 Other general symptoms and signs: Secondary | ICD-10-CM

## 2015-04-10 DIAGNOSIS — R269 Unspecified abnormalities of gait and mobility: Secondary | ICD-10-CM

## 2015-04-10 DIAGNOSIS — R42 Dizziness and giddiness: Secondary | ICD-10-CM | POA: Diagnosis not present

## 2015-04-10 NOTE — Therapy (Signed)
[facility] [address] [name], [place], [address] Phone: [phone]   Fax:  [phone]  Physical Therapy Treatment  Patient Details  Name: [name] MRN: [id number] Date of Birth: Dec 17, 1945 Referring Provider: Margette Fast, MD  Encounter Date: 04/10/2015      PT End of Session - 04/10/15 1625    Visit Number 6   Number of Visits 9  plus 4 visits   Date for PT Re-Evaluation 04/13/15  PT requesting add'l 2 weeks (04/27/15)   Authorization Type Medicare - GCodes required   PT Start Time 2694   PT Stop Time 1617   PT Time Calculation (min) 47 min   Equipment Utilized During Treatment Gait belt   Activity Tolerance Patient tolerated treatment well   Behavior During Therapy [facility] for tasks assessed/performed      Past Medical History  Diagnosis Date  . Hypertension   . Arthritis RIGHT SHOULDER  . Immature cataract BILATERAL  . Non-small cell lung cancer ([place]) DX SEPT 2010  W/ CHEMORADIATION AT THAT TIME -- NOW  W/ METS--  CURRENTLY ON MAINTENANCE CHEMO TX  EVERY 30 DAYS    ONCOLOGIST- DR [name]  . Acute meniscal tear of knee RIGHT KNEE  . BPH (benign prostatic hypertrophy)   . Dizziness and giddiness 04/23/2014  . Tremor 04/23/2014    Right hand  . HOH (hard of hearing)     bilateral hearing aids  . Radiation 01/08/09-01/21/09    Mediastinum 30 Gy x 12 fractions  . Radiation 08/19/14-09/04/14    Palliative RT Porta hepatic LN 30 Gy  . Pneumonia   . Abnormality of gait 03/12/2015    Past Surgical History  Procedure Laterality Date  . 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 Rotator cuff repair  2008    LEFT SHOULDER  . Bilateral ear drum surgery  1960'S  . Orif left ring finger  and revascularization of radial side  02-16-2007    DEGLOVING INJURY  . Knee arthroscopy  12/21/2011    Procedure: ARTHROSCOPY KNEE;  Surgeon: Tobi Bastos, MD;  Location: [facility];  Service: Orthopedics;  Laterality: Right;  WITH MEDIAL MENISECTOMY  . Cataract extraction Bilateral   . Tonsillectomy      as child  . Ovarian cyst surgery      removed from left jaw area- benign  . Ovarian cyst surgery       NOTE; pt unaware of why this is in his chart  . Shoulder open rotator cuff repair Right 06/05/2014    Procedure: RIGHT ROTATOR CUFF REPAIR SHOULDER OPEN;  Surgeon: Latanya Maudlin, MD;  [facility];  Service: Orthopedics;  Laterality: Right;  . Ercp N/A 08/09/2014    Procedure: ENDOSCOPIC RETROGRADE CHOLANGIOPANCREATOGRAPHY (ERCP);  Surgeon: Carol Ada, MD;  Location: [facility];  Service: Endoscopy;  Laterality: N/A;  . Eus N/A 08/09/2014    Procedure: UPPER ENDOSCOPIC ULTRASOUND (EUS) RADIAL;  Surgeon: Carol Ada, MD;  [facility];  Service: Endoscopy;  Laterality: N/A;  . Fine needle aspiration N/A 08/09/2014    Procedure: FINE NEEDLE ASPIRATION (FNA) LINEAR;  Surgeon: Carol Ada, MD;  [facility];  Service: Endoscopy;  Laterality: N/A;  . Esophagogastroduodenoscopy (egd) with propofol N/A 08/09/2014    Procedure: ESOPHAGOGASTRODUODENOSCOPY (EGD) WITH PROPOFOL;  Surgeon: Carol Ada, MD;  [facility];  Service: Endoscopy;  Laterality: N/A;  . Bile duct  stent placement    . Gastrostomy tube placement      There were no vitals filed for this visit.  Visit Diagnosis:  Dizziness and giddiness - Plan: PT plan of care cert/re-cert  Abnormality of gait - Plan: PT plan of care cert/re-cert  Decreased functional activity tolerance - Plan: PT plan of care cert/re-cert      Subjective Assessment - 04/10/15 1533    Subjective Pt reported he has been really dizzy this last week, 8-9/10, but better today (6/10). Pt has been walking longer distances (to/from bathroom plus a little more).  Pt reported he sits up for approx. 5-10 minutes each day, and is able to sit up the entire car  ride to PT. Otherwise, pt is propped up on pillows while supine on sofa.   Patient is accompained by: Family member   Pertinent History metastatic lung cancer (Hospice)   Patient Stated Goals Pt: "To feel better. To walk without the walker"   Pt's wife: For pt to tolerate being out of bed without being limited by dizziness/lightheadedness.   Currently in Pain? No/denies       DHI: not included in charged PT time.     [facility] PT Assessment - 04/10/15 1621    Observation/Other Assessments   Focus on Therapeutic Outcomes (FOTO)  DHI: 74%          Therex: Pt performed standing therex with cues and supervision for safety and technique. Please see pt instructions for details.           [facility] Adult PT Treatment/Exercise - 04/10/15 1621    Ambulation/Gait   Ambulation/Gait Yes   Ambulation/Gait Assistance 5: Supervision;4: Min guard   Ambulation/Gait Assistance Details Pt amb. with rollator with slight incr. in dizziness (7/10 from 6/10) but not overt LOB or incr. postural sway when utilizing rollator. Cues for upright posture.   Ambulation Distance (Feet) 230 Feet  x2   Assistive device Rollator   Gait Pattern Step-through pattern;Decreased stride length;Decreased trunk rotation   Ambulation Surface Level;Indoor           Self care:     PT Education - 04/10/15 1622    Education provided Yes   Education Details PT discussed goal progress and recert procress in order to maximize functional gains, now that pt is not as limited by lightheadedness. PT educated pt on walking program and strengthening HEP. PT discussed process of obtaining rollator.   Person(s) Educated Patient;Spouse   Methods Explanation;Demonstration;Tactile cues;Verbal cues;Handout   Comprehension Verbalized understanding;Returned demonstration;Need further instruction          PT Short Term Goals - 03/14/15 1333    PT SHORT TERM GOAL #1   Title STG's = LTG's           PT Long Term Goals -  04/10/15 1628    PT LONG TERM GOAL #1   Title Pt will perform initial HEP with mod I using paper handout to maximize functional gains made in PT. Target date: 04/11/15   Baseline All unmet goals will be carried over to new POC: 04/27/15   Status Partially Met   PT LONG TERM GOAL #2   Title Rule out orthostatic hypotension as origin of symptoms. Target date: 04/11/15   Status Achieved   PT LONG TERM GOAL #3   Title Pt/wife will report pt ablity to tolerate being upright (seated) for > 50% of 12-hour day. Target date: 04/11/15   Status Not Met   PT LONG TERM GOAL #  4   Title Pt will ambulate 57' over level, indoor surfaces with supervision using LRAD with no more 2-point increase in subjective symptoms to indicate increased pt tolerance to household ambulation. Target date: 04/11/15   Baseline 12/23: gait x125' with min guard, initial 35' with RW, reamining distance without AD. Dizziness/lightheadedness decreased from 5/10 to 4/10.   Status Achieved   PT LONG TERM GOAL #5   Title Pt will decrease DHI score from 76% to 58% to indicate significant decrease in pt-perceived disability due to symptoms of lightheadedness. Target date: 04/11/15   Status Partially Met   Additional Long Term Goals   Additional Long Term Goals Yes   PT LONG TERM GOAL #6   Title Pt will ambulate 400' over even/uneven terrain at MOD I level with LRAD, with no more than 2-point increase in dizziness to improve functional mobility. Target date: 04/24/15   Status New               Plan - 04/10/15 1626    Clinical Impression Statement Pt demonstrated progress, as he met LTG 4, and partially met LTGs 1 and 5. Pt did not meet LTG 3. Pt would benefit from additional PT, as he missed several visits 2/2 illness and is now able to participate more during therapy sessions. Pt would benefit from skilled PT to improve safety during functional mobility. PT sent rollator request to Dr. Jannifer Franklin. All unmet goals will be carried over to  new POC (04/24/15).   Pt will benefit from skilled therapeutic intervention in order to improve on the following deficits Dizziness;Decreased activity tolerance;Abnormal gait;Decreased mobility   Rehab Potential Good   Clinical Impairments Affecting Rehab Potential Pt has metastatic lung cancer and is currently on Hospice care, per chart   PT Frequency 2x / week   PT Duration 2 weeks  PT requesting addtional 2x/week for 2 weeks   PT Treatment/Interventions ADLs/Self Care Home Management;Canalith Repostioning;DME Instruction;Gait training;Stair training;Functional mobility training;Therapeutic activities;Therapeutic exercise;Balance training;Vestibular;Neuromuscular re-education;Patient/family education   PT Next Visit Plan Balance and strength training   PT Home Exercise Plan Gaze stab. and strengthening HEP.   Consulted and Agree with Plan of Care Patient;Family member/caregiver   Family Member Consulted wife, [name]        Problem List Patient Active Problem List   Diagnosis Date Noted  . Abnormality of gait 03/12/2015  . Lung cancer ([place])   . Protein-calorie malnutrition, severe ([place]) 11/26/2014  . Hypoalbuminemia 11/18/2014  . Leukocytosis 11/18/2014  . Dysphagia 11/18/2014  . Nausea with vomiting 10/03/2014  . Central line complication 97/35/3299  . Anorexia 10/03/2014  . Weight loss 10/03/2014  . Transaminitis 10/03/2014  . Hyperphosphatemia 10/03/2014  . Encounter for antineoplastic immunotherapy 09/28/2014  . Non-small cell lung cancer ([place]) 09/21/2014  . Abdominal pain   . Fever   . 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 Biliary obstruction   . Fatigue 08/27/2014  . Abdominal pain, acute   . Abdominal mass   . Abdominal pain, generalized 08/07/2014  . CRF (chronic renal failure) 08/07/2014  . Obstructive jaundice 08/07/2014  . Malnutrition of moderate degree ([place]) 08/07/2014  . Obstructive jaundice due to cancer 08/06/2014  . Rotator  cuff tear, non-traumatic 06/05/2014  . Dizziness and giddiness 04/23/2014  . Tremor 04/23/2014  . Acute on chronic renal failure ([place]) 05/24/2013  . Pancytopenia ([place]) 05/24/2013  . Osteoarthritis of right knee 12/21/2011  . Meniscus, medial, posterior horn derangement 12/21/2011  . Dyspnea 12/04/2010  .  Bronchogenic cancer of left lung ([place]) 12/11/2008  . ARTHRITIS 12/10/2008    Usha Slager L 04/10/2015, 4:37 PM  [facility] [address] [place], [place], [address] Phone: [phone]   Fax:  [phone]  Name: Johnmark Geiger MRN: [id number] Date of Birth: Jan 29, 1946    Geoffry Paradise, PT,DPT 04/10/2015 4:37 PM Phone: [phone] Fax: [phone]

## 2015-04-10 NOTE — Telephone Encounter (Signed)
Dr. Jannifer Franklin  Mr. [name] ambulated with a rollator last session and this session, with improved safety and was able to ambulate longer distances. I feel he would benefit from a rollator, if you agree, please send [place] an order for a rollator.  Thank you, Geoffry Paradise, PT,DPT 04/10/2015 3:55 PM Phone: [phone] Fax: [phone]

## 2015-04-10 NOTE — Patient Instructions (Signed)
Mini Squat: Double Leg    With feet shoulder width apart, reach forward for balance and do a mini squat. Keep knees in line with second toe. Knees do not go past toes. Repeat __5_ times per set. Rest _60__ seconds after set. Do __2_ sets per session. Perform every day.  http://plyo.exer.us/70   Copyright  VHI. All rights reserved.   "I love a Parade" Lift    Using a chair if necessary (or hold onto counter), march in place, 5 times with each leg.  Repeat __2__ times. Do __1__ sessions per day.  http://gt2.exer.us/344   Copyright  VHI. All rights reserved.    WALKING: Stand up and wait 30-60 seconds prior to walking, then walk for 90 seconds, then take a rest break. Repeat 3 times a day. Increase time by 30 seconds, as tolerated.

## 2015-04-10 NOTE — Telephone Encounter (Signed)
I will write a prescription for the Rollator, and fax it in.

## 2015-04-10 NOTE — Addendum Note (Signed)
Addended by: Margette Fast on: 04/10/2015 05:11 PM   Modules accepted: Orders

## 2015-04-15 ENCOUNTER — Ambulatory Visit: Payer: Medicare Other

## 2015-04-15 VITALS — BP 111/88 | HR 104

## 2015-04-15 DIAGNOSIS — R269 Unspecified abnormalities of gait and mobility: Secondary | ICD-10-CM

## 2015-04-15 DIAGNOSIS — R42 Dizziness and giddiness: Secondary | ICD-10-CM | POA: Diagnosis not present

## 2015-04-15 NOTE — Therapy (Signed)
[facility] [address] [place], [place], [address] Phone: [phone]   Fax:  [phone]  Physical Therapy Treatment  Patient Details  Name: [name] MRN: [id number] Date of Birth: 05-13-1945 Referring Provider: Margette Fast, MD  Encounter Date: 04/15/2015      PT End of Session - 04/15/15 1619    Visit Number 7   Number of Visits 10  plus four visits (as pt missed therapy 2/2 illness)   Date for PT Re-Evaluation 04/27/15   Authorization Type Medicare - GCodes required   PT Start Time 1530   PT Stop Time 1615   PT Time Calculation (min) 45 min   Equipment Utilized During Treatment Gait belt   Activity Tolerance Other (comment)  limited by lightheadedness      Past Medical History  Diagnosis Date  . Hypertension   . Arthritis RIGHT SHOULDER  . Immature cataract BILATERAL  . Non-small cell lung cancer ([place]) DX SEPT 2010  W/ CHEMORADIATION AT THAT TIME -- NOW  W/ METS--  CURRENTLY ON MAINTENANCE CHEMO TX  EVERY 30 DAYS    ONCOLOGIST- DR [name]  . Acute meniscal tear of knee RIGHT KNEE  . BPH (benign prostatic hypertrophy)   . Dizziness and giddiness 04/23/2014  . Tremor 04/23/2014    Right hand  . HOH (hard of hearing)     bilateral hearing aids  . Radiation 01/08/09-01/21/09    Mediastinum 30 Gy x 12 fractions  . Radiation 08/19/14-09/04/14    Palliative RT Porta hepatic LN 30 Gy  . Pneumonia   . Abnormality of gait 03/12/2015    Past Surgical History  Procedure Laterality Date  . 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 Rotator cuff repair  2008    LEFT SHOULDER  . Bilateral ear drum surgery  1960'S  . Orif left ring finger  and revascularization of radial side  02-16-2007    DEGLOVING INJURY  . Knee arthroscopy  12/21/2011    Procedure: ARTHROSCOPY KNEE;  Surgeon: Tobi Bastos, MD;  Location: [facility];  Service: Orthopedics;  Laterality: Right;  WITH MEDIAL MENISECTOMY  . Cataract extraction Bilateral   . Tonsillectomy      as child  . Ovarian cyst surgery      removed from left jaw area- benign  . Ovarian cyst surgery       NOTE; pt unaware of why this is in his chart  . Shoulder open rotator cuff repair Right 06/05/2014    Procedure: RIGHT ROTATOR CUFF REPAIR SHOULDER OPEN;  Surgeon: Latanya Maudlin, MD;  Location: [facility];  Service: Orthopedics;  Laterality: Right;  . Ercp N/A 08/09/2014    Procedure: ENDOSCOPIC RETROGRADE CHOLANGIOPANCREATOGRAPHY (ERCP);  Surgeon: Carol Ada, MD;  Location: [facility];  Service: Endoscopy;  Laterality: N/A;  . Eus N/A 08/09/2014    Procedure: UPPER ENDOSCOPIC ULTRASOUND (EUS) RADIAL;  Surgeon: Carol Ada, MD;  Location: [facility];  Service: Endoscopy;  Laterality: N/A;  . Fine needle aspiration N/A 08/09/2014    Procedure: FINE NEEDLE ASPIRATION (FNA) LINEAR;  Surgeon: Carol Ada, MD;  Location: [facility];  Service: Endoscopy;  Laterality: N/A;  . Esophagogastroduodenoscopy (egd) with propofol N/A 08/09/2014    Procedure: ESOPHAGOGASTRODUODENOSCOPY (EGD) WITH PROPOFOL;  Surgeon: Carol Ada, MD;  Location: [facility];  Service: Endoscopy;  Laterality: N/A;  . Bile duct stent placement    . Gastrostomy tube  placement      Filed Vitals:   04/15/15 1559 04/15/15 1600  BP: 111/83 111/88  Pulse: 97 104    Visit Diagnosis:  Dizziness and giddiness  Abnormality of gait      Subjective Assessment - 04/15/15 1534    Subjective Pt reported his stomach has been upset today and he's not feeling great today. He's been walking more at home.  Pt's wife reported pt has not had as much water today and pt reported 6/10 dizziness before, during and after session.   Patient is accompained by: Family member   Pertinent History metastatic lung cancer ([facility])   Patient Stated Goals Pt: "To feel better. To walk without the walker"   Pt's wife:  For pt to tolerate being out of bed without being limited by dizziness/lightheadedness.   Currently in Pain? No/denies        Neuro re-ed: Pt performed balance activities in corner with chair in front for safety, with supervision to min guard for safety. Cues and demonstration for technique. Pt performed on compliant and non-compliant surfaces, with appropriate activities added to HEP as indicated/tolerated. Feet apart/together with eyes open/closed, feet apart/together with head turns/nods. 10 reps or 30 second holds, 2-3 reps per activity. Please see HEP for details.                   Self Care:     PT Education - 04/15/15 1618    Education provided Yes   Education Details PT provided pt with rollator prescription from Dr. Jannifer Franklin and instructed pt to provide to the vendor of their choice. PT provided pt with balance HEP. PT discussed the importance of not performing [email or web address] BPPV treatments at home, as it was not indicated based on PT's initial exam. PT explained that dizziness is likely 2/2 orthostatic hypotension and decr. vestibular input. PT educated pt that we will continue to monitor for BPPV.   Person(s) Educated Patient;Spouse   Methods Explanation;Demonstration;Tactile cues;Verbal cues;Handout   Comprehension Verbalized understanding;Returned demonstration;Need further instruction          PT Short Term Goals - 03/14/15 1333    PT SHORT TERM GOAL #1   Title STG's = LTG's           PT Long Term Goals - 04/15/15 1625    PT LONG TERM GOAL #1   Title Pt will perform initial HEP with mod I using paper handout to maximize functional gains made in PT. Target date: 04/25/15   Baseline All unmet goals will be carried over to new POC: 04/27/15   Status On-going   PT LONG TERM GOAL #2   Title Rule out orthostatic hypotension as origin of symptoms. Target date: 04/11/15   Status Achieved   PT LONG TERM GOAL #3   Title Pt/wife will report pt ablity to  tolerate being upright (seated) for > 50% of 12-hour day. Target date: 04/25/15   Status Not Met   PT LONG TERM GOAL #4   Title Pt will ambulate 54' over level, indoor surfaces with supervision using LRAD with no more 2-point increase in subjective symptoms to indicate increased pt tolerance to household ambulation. Target date: 04/11/15   Baseline 12/23: gait x125' with min guard, initial 19' with RW, reamining distance without AD. Dizziness/lightheadedness decreased from 5/10 to 4/10.   Status Achieved   PT LONG TERM GOAL #5   Title Pt will decrease DHI score from 76% to 58% to indicate significant decrease in pt-perceived disability due  to symptoms of lightheadedness. Target date: 04/25/15   Status On-going   PT LONG TERM GOAL #6   Title Pt will ambulate 400' over even/uneven terrain at MOD I level with LRAD, with no more than 2-point increase in dizziness to improve functional mobility. Target date: 04/25/15   Status On-going               Plan - 04/15/15 1621    Clinical Impression Statement Pt continues to be limited by lightheadedness, as he required frequent seated rest breaks. Pt experienced increased postural sway during activities on compliant surfaces and with eyes closed, indicating decr. input from vestibular system. Continue with POC.   Pt will benefit from skilled therapeutic intervention in order to improve on the following deficits Dizziness;Decreased activity tolerance;Abnormal gait;Decreased mobility   Rehab Potential Good   Clinical Impairments Affecting Rehab Potential Pt has metastatic lung cancer and is currently on Hospice care, per chart   PT Frequency 2x / week   PT Duration 2 weeks  PT requesting add'l 2 weeks    PT Treatment/Interventions ADLs/Self Care Home Management;Canalith Repostioning;DME Instruction;Gait training;Stair training;Functional mobility training;Therapeutic activities;Therapeutic exercise;Balance training;Vestibular;Neuromuscular  re-education;Patient/family education   PT Next Visit Plan Gait training with rollator while performing head turns, as tolerated. Balance on compliant surfaces. Re-assess for BPPV if indicated.   PT Home Exercise Plan Gaze stab. and strengthening/balance HEP.   Consulted and Agree with Plan of Care Patient   Family Member Consulted wife, [name]        Problem List Patient Active Problem List   Diagnosis Date Noted  . Abnormality of gait 03/12/2015  . Lung cancer ([place])   . Protein-calorie malnutrition, severe ([place]) 11/26/2014  . Hypoalbuminemia 11/18/2014  . Leukocytosis 11/18/2014  . Dysphagia 11/18/2014  . Nausea with vomiting 10/03/2014  . Central line complication 40/37/5436  . Anorexia 10/03/2014  . Weight loss 10/03/2014  . Transaminitis 10/03/2014  . Hyperphosphatemia 10/03/2014  . Encounter for antineoplastic immunotherapy 09/28/2014  . Non-small cell lung cancer ([place]) 09/21/2014  . Abdominal pain   . Fever   . 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 Biliary obstruction   . Fatigue 08/27/2014  . Abdominal pain, acute   . Abdominal mass   . Abdominal pain, generalized 08/07/2014  . CRF (chronic renal failure) 08/07/2014  . Obstructive jaundice 08/07/2014  . Malnutrition of moderate degree ([place]) 08/07/2014  . Obstructive jaundice due to cancer 08/06/2014  . Rotator cuff tear, non-traumatic 06/05/2014  . Dizziness and giddiness 04/23/2014  . Tremor 04/23/2014  . Acute on chronic renal failure ([place]) 05/24/2013  . Pancytopenia ([place]) 05/24/2013  . Osteoarthritis of right knee 12/21/2011  . Meniscus, medial, posterior horn derangement 12/21/2011  . Dyspnea 12/04/2010  . Bronchogenic cancer of left lung ([place]) 12/11/2008  . ARTHRITIS 12/10/2008    Lashanda Storlie L 04/15/2015, 4:26 PM  [facility] [address] [place], [place], [address] Phone: [phone]   Fax:   [phone]  Name: Ambrose Wile MRN: [id number] Date of Birth: April 23, 1945    Geoffry Paradise, PT,DPT 04/15/2015 4:26 PM Phone: [phone] Fax: [phone]

## 2015-04-15 NOTE — Patient Instructions (Addendum)
Perform in corner, with chair in front of you for safety:  Feet Together, Varied Arm Positions - Eyes Closed    Stand with feet together and arms at your side. Close eyes and visualize upright position. Hold __30__ seconds. Repeat _3___ times per session. Do __1__ sessions every other day.  Copyright  VHI. All rights reserved.  Feet Apart (Compliant Surface) Head Motion - Eyes Open    With eyes open, standing on compliant surface: _pillow/cushion_______, feet shoulder width apart, move head slowly: up and down 5-10 times and side to side 5-10 times. Repeat __3__ times per session. Do _1___ sessions every other day.  Copyright  VHI. All rights reserved.

## 2015-04-18 ENCOUNTER — Ambulatory Visit: Payer: Medicare Other

## 2015-04-18 DIAGNOSIS — R42 Dizziness and giddiness: Secondary | ICD-10-CM

## 2015-04-18 DIAGNOSIS — R6889 Other general symptoms and signs: Secondary | ICD-10-CM

## 2015-04-18 DIAGNOSIS — R269 Unspecified abnormalities of gait and mobility: Secondary | ICD-10-CM

## 2015-04-18 NOTE — Therapy (Signed)
[facility] [address] [place], [place], [address] Phone: [phone]   Fax:  [phone]  Physical Therapy Treatment  Patient Details  Name: [name] MRN: [id number] Date of Birth: [birth year] Referring Provider: Margette Fast, MD  Encounter Date: 04/18/2015      PT End of Session - 04/18/15 1536    Visit Number 8   Number of Visits 10   Date for PT Re-Evaluation 04/27/15   Authorization Type Medicare - GCodes required   PT Start Time 5465   PT Stop Time 1528   PT Time Calculation (min) 42 min   Equipment Utilized During Treatment --  Gait belt not used 2/2 stomach pain. Min guard to S for safety.   Activity Tolerance Patient limited by fatigue   Behavior During Therapy [facility] for tasks assessed/performed      Past Medical History  Diagnosis Date  . Hypertension   . Arthritis RIGHT SHOULDER  . Immature cataract BILATERAL  . Non-small cell lung cancer ([place]) DX SEPT 2010  W/ CHEMORADIATION AT THAT TIME -- NOW  W/ METS--  CURRENTLY ON MAINTENANCE CHEMO TX  EVERY 30 DAYS    ONCOLOGIST- DR [name]  . Acute meniscal tear of knee RIGHT KNEE  . BPH (benign prostatic hypertrophy)   . Dizziness and giddiness 04/23/2014  . Tremor 04/23/2014    Right hand  . HOH (hard of hearing)     bilateral hearing aids  . Radiation 01/08/09-01/21/09    Mediastinum 30 Gy x 12 fractions  . Radiation 08/19/14-09/04/14    Palliative RT Porta hepatic LN 30 Gy  . Pneumonia   . Abnormality of gait 03/12/2015    Past Surgical History  Procedure Laterality Date  . 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 Rotator cuff repair  2008    LEFT SHOULDER  . Bilateral ear drum surgery  1960'S  . Orif left ring finger  and revascularization of radial side  02-16-2007    DEGLOVING INJURY  . Knee arthroscopy  12/21/2011    Procedure: ARTHROSCOPY KNEE;  Surgeon: Tobi Bastos, MD;  Location: [facility];  Service: Orthopedics;  Laterality: Right;  WITH MEDIAL MENISECTOMY  . Cataract extraction Bilateral   . Tonsillectomy      as child  . Ovarian cyst surgery      removed from left jaw area- benign  . Ovarian cyst surgery       NOTE; pt unaware of why this is in his chart  . Shoulder open rotator cuff repair Right 06/05/2014    Procedure: RIGHT ROTATOR CUFF REPAIR SHOULDER OPEN;  Surgeon: Latanya Maudlin, MD;  Location: [facility];  Service: Orthopedics;  Laterality: Right;  . Ercp N/A 08/09/2014    Procedure: ENDOSCOPIC RETROGRADE CHOLANGIOPANCREATOGRAPHY (ERCP);  Surgeon: Carol Ada, MD;  Location: [facility];  Service: Endoscopy;  Laterality: N/A;  . Eus N/A 08/09/2014    Procedure: UPPER ENDOSCOPIC ULTRASOUND (EUS) RADIAL;  Surgeon: Carol Ada, MD;  Location: [facility];  Service: Endoscopy;  Laterality: N/A;  . Fine needle aspiration N/A 08/09/2014    Procedure: FINE NEEDLE ASPIRATION (FNA) LINEAR;  Surgeon: Carol Ada, MD;  Location: [facility];  Service: Endoscopy;  Laterality: N/A;  . Esophagogastroduodenoscopy (egd) with propofol N/A 08/09/2014    Procedure: ESOPHAGOGASTRODUODENOSCOPY (EGD) WITH PROPOFOL;  Surgeon: Carol Ada, MD;  Location: [facility];  Service: Endoscopy;  Laterality: N/A;  .  Bile duct stent placement    . Gastrostomy tube placement      There were no vitals filed for this visit.  Visit Diagnosis:  Dizziness and giddiness  Abnormality of gait  Decreased functional activity tolerance      Subjective Assessment - 04/18/15 1452    Subjective Pt reported he received rollator since last visit and has been using it. Pt reported he rode in the front seat all the way to PT appt. Pt reported he has been experiencing stomach pains today.    Patient is accompained by: Family member   Pertinent History metastatic lung cancer ([facility])   Patient Stated Goals Pt: "To feel better. To walk without the walker"    Pt's wife: For pt to tolerate being out of bed without being limited by dizziness/lightheadedness. Pt's BP was 110 "over something at home but pt hasn't had much fluids today 2/2 stomach pain.    Currently in Pain? Yes   Pain Score --  6-7/10   Pain Location --  stomach   Pain Orientation Mid   Pain Descriptors / Indicators Sharp   Pain Type Acute pain   Pain Onset Today   Pain Frequency Constant   Aggravating Factors  moving around makes it worse   Pain Relieving Factors ativan and reglan                         [facility] Adult PT Treatment/Exercise - 04/18/15 1514    Ambulation/Gait   Ambulation/Gait Yes   Ambulation/Gait Assistance 5: Supervision   Ambulation/Gait Assistance Details Pt amb. while performing head turns, cues to improve stride length and heel strike.  Pt required one seated rest break after amb. 2/2 fatigue.   Ambulation Distance (Feet) --  75'x2 and 230'x2   Assistive device Rollator   Gait Pattern Step-through pattern;Decreased stride length;Decreased trunk rotation   Ambulation Surface Level;Indoor   High Level Balance   High Level Balance Activities Backward walking;Marching forwards;Marching backwards;Other (comment);Head turns;Side stepping  forward amb. with head turns, backward c and s EC.   High Level Balance Comments Performed at counter. Pt required min guard to S for safety and 1 UE for support on counter. 4x7'/activity. Pt able to perform sidestepping without UE support.  Pt required seated rest breaks during session (5 times 2/2 fatigue and 2/10 L knee pain).  Eyes closed (EC).      Neuro re-ed: Pt reviewed and performed balance HEP from previous session. Cues for technique (decrease trunk rotation during static standing with head turns/nods). Please see pt instructions for details.           PT Education - 04/18/15 1536    Education provided Yes   Education Details PT reviewed balance HEP provided last session, as pt reported he  had not yet performed.    Person(s) Educated Patient   Methods Explanation;Demonstration;Verbal cues;Handout;Tactile cues   Comprehension Verbalized understanding;Returned demonstration          PT Short Term Goals - 03/14/15 1333    PT SHORT TERM GOAL #1   Title STG's = LTG's           PT Long Term Goals - 04/15/15 1625    PT LONG TERM GOAL #1   Title Pt will perform initial HEP with mod I using paper handout to maximize functional gains made in PT. Target date: 04/25/15   Baseline All unmet goals will be carried over to new POC: 04/27/15  Status On-going   PT LONG TERM GOAL #2   Title Rule out orthostatic hypotension as origin of symptoms. Target date: 04/11/15   Status Achieved   PT LONG TERM GOAL #3   Title Pt/wife will report pt ablity to tolerate being upright (seated) for > 50% of 12-hour day. Target date: 04/25/15   Status Not Met   PT LONG TERM GOAL #4   Title Pt will ambulate 56' over level, indoor surfaces with supervision using LRAD with no more 2-point increase in subjective symptoms to indicate increased pt tolerance to household ambulation. Target date: 04/11/15   Baseline 12/23: gait x125' with min guard, initial 44' with RW, reamining distance without AD. Dizziness/lightheadedness decreased from 5/10 to 4/10.   Status Achieved   PT LONG TERM GOAL #5   Title Pt will decrease DHI score from 76% to 58% to indicate significant decrease in pt-perceived disability due to symptoms of lightheadedness. Target date: 04/25/15   Status On-going   PT LONG TERM GOAL #6   Title Pt will ambulate 400' over even/uneven terrain at MOD I level with LRAD, with no more than 2-point increase in dizziness to improve functional mobility. Target date: 04/25/15   Status On-going               Plan - 04/18/15 1537    Clinical Impression Statement Pt demonstrated progress, as he was able to perform high level balance activities at counter with 0-1 UE for support. Pt continues to  experience incr. postral sway during activities with eyes closed and standing on compliant surfaces, indicating decr. vestibular input. Continue with POC.   Pt will benefit from skilled therapeutic intervention in order to improve on the following deficits Dizziness;Decreased activity tolerance;Abnormal gait;Decreased mobility   Rehab Potential Good   Clinical Impairments Affecting Rehab Potential Pt has metastatic lung cancer and is currently on Hospice care, per chart   PT Frequency 2x / week   PT Duration 2 weeks   PT Treatment/Interventions ADLs/Self Care Home Management;Canalith Repostioning;DME Instruction;Gait training;Stair training;Functional mobility training;Therapeutic activities;Therapeutic exercise;Balance training;Vestibular;Neuromuscular re-education;Patient/family education   PT Next Visit Plan Begin to assess goals, re-assess for BPPV if indicated. Update HEP as needed.   PT Home Exercise Plan Gaze stab. and strengthening/balance HEP.   Consulted and Agree with Plan of Care Patient   Family Member Consulted wife, [name]        Problem List Patient Active Problem List   Diagnosis Date Noted  . Abnormality of gait 03/12/2015  . Lung cancer ([place])   . Protein-calorie malnutrition, severe ([place]) 11/26/2014  . Hypoalbuminemia 11/18/2014  . Leukocytosis 11/18/2014  . Dysphagia 11/18/2014  . Nausea with vomiting 10/03/2014  . Central line complication 23/76/2831  . Anorexia 10/03/2014  . Weight loss 10/03/2014  . Transaminitis 10/03/2014  . Hyperphosphatemia 10/03/2014  . Encounter for antineoplastic immunotherapy 09/28/2014  . Non-small cell lung cancer ([place]) 09/21/2014  . Abdominal pain   . Fever   . 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 Biliary obstruction   . Fatigue 08/27/2014  . Abdominal pain, acute   . Abdominal mass   . Abdominal pain, generalized 08/07/2014  . CRF (chronic renal failure) 08/07/2014  . Obstructive jaundice  08/07/2014  . Malnutrition of moderate degree ([place]) 08/07/2014  . Obstructive jaundice due to cancer 08/06/2014  . Rotator cuff tear, non-traumatic 06/05/2014  . Dizziness and giddiness 04/23/2014  . Tremor 04/23/2014  . Acute on chronic renal failure ([place]) 05/24/2013  . Pancytopenia (  [place]) 05/24/2013  . Osteoarthritis of right knee 12/21/2011  . Meniscus, medial, posterior horn derangement 12/21/2011  . Dyspnea 12/04/2010  . Bronchogenic cancer of left lung ([place]) 12/11/2008  . ARTHRITIS 12/10/2008    Miller,Jennifer L 04/18/2015, 3:40 PM  [facility] [address] [place], [place], [address] Phone: [phone]   Fax:  [phone]  Name: [name] MRN: [id number] Date of Birth: Oct 31, 1945    Geoffry Paradise, PT,DPT 04/18/2015 3:40 PM Phone: [phone] Fax: [phone]

## 2015-04-18 NOTE — Patient Instructions (Signed)
Perform in corner, with chair in front of you for safety:  Feet Together, Varied Arm Positions - Eyes Closed    Stand with feet together and arms at your side. Close eyes and visualize upright position. Hold __30__ seconds. Repeat _3___ times per session. Do __1__ sessions every other day.  Copyright  VHI. All rights reserved.  Feet Apart (Compliant Surface) Head Motion - Eyes Open    With eyes open, standing on compliant surface: _pillow/cushion_______, feet shoulder width apart, move head slowly: up and down 5-10 times and side to side 5-10 times. Repeat __3__ times per session. Do _1___ sessions every other day.  Copyright  VHI. All rights reserved.

## 2015-04-22 ENCOUNTER — Ambulatory Visit: Payer: Medicare Other | Admitting: Physical Therapy

## 2015-04-22 DIAGNOSIS — R269 Unspecified abnormalities of gait and mobility: Secondary | ICD-10-CM

## 2015-04-22 DIAGNOSIS — R42 Dizziness and giddiness: Secondary | ICD-10-CM

## 2015-04-22 NOTE — Patient Instructions (Addendum)
[place] - do these exercises every day. It's okay to perform these exercises without [name] standing next to you as long as you have your rollator close by.  Gaze Stabilization: Tip Card  1.Target must remain in focus, not blurry, and appear stationary while head is in motion. 2.Perform exercises with small head movements (45 to either side of midline). 3.Increase speed of head motion so long as target is in focus. 4.If you wear eyeglasses, be sure you can see target through lens (therapist will give specific instructions for bifocal / progressive lenses). 5.These exercises may provoke dizziness or nausea. Work through these symptoms. If too dizzy, slow head movement slightly. Rest between each exercise. 6.Exercises demand concentration; avoid distractions. 7.For safety, perform standing exercises close to a counter, wall, corner, or next to someone.  Copyright  VHI. All rights reserved.    Gaze Stabilization: Standing Feet Apart    Feet shoulder width apart, with rollator in front of you, keeping eyes on target on wall __5-6__ feet away, tilt head down 15-30 and move head side to side for __30__ repetitions. Repeat while moving head up and down for __30__ repetitions. Do __2__ sessions per day.  Copyright  VHI. All rights reserved.   Balance: Eyes Closed - Bilateral (Varied Surfaces)    Stand in front of your couch with feet shoulder width, close eyes. Maintain balance __40__ seconds. Repeat __4__ times per set. Do _2__ sessions per day. * Don't stand on a pillow by yourself yet.

## 2015-04-22 NOTE — Therapy (Signed)
[place] [address] [place], [place], [address] Phone: [phone]   Fax:  [phone]  Physical Therapy Treatment  Patient Details  Name: [name] MRN: [id number] Date of Birth: [birth year] Referring Provider: Margette Fast, MD  Encounter Date: 04/22/2015      PT End of Session - 04/22/15 1700    Visit Number 9   Number of Visits 10   Date for PT Re-Evaluation 04/27/15   Authorization Type Medicare - GCodes required   PT Start Time 3382   PT Stop Time 1537   PT Time Calculation (min) 43 min   Activity Tolerance Patient limited by fatigue   Behavior During Therapy [facility] for tasks assessed/performed      Past Medical History  Diagnosis Date  . Hypertension   . Arthritis RIGHT SHOULDER  . Immature cataract BILATERAL  . Non-small cell lung cancer ([place]) DX SEPT 2010  W/ CHEMORADIATION AT THAT TIME -- NOW  W/ METS--  CURRENTLY ON MAINTENANCE CHEMO TX  EVERY 30 DAYS    ONCOLOGIST- DR [name]  . Acute meniscal tear of knee RIGHT KNEE  . BPH (benign prostatic hypertrophy)   . Dizziness and giddiness 04/23/2014  . Tremor 04/23/2014    Right hand  . HOH (hard of hearing)     bilateral hearing aids  . Radiation 01/08/09-01/21/09    Mediastinum 30 Gy x 12 fractions  . Radiation 08/19/14-09/04/14    Palliative RT Porta hepatic LN 30 Gy  . Pneumonia   . Abnormality of gait 03/12/2015    Past Surgical History  Procedure Laterality Date  . 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 Rotator cuff repair  2008    LEFT SHOULDER  . Bilateral ear drum surgery  1960'S  . Orif left ring finger  and revascularization of radial side  02-16-2007    DEGLOVING INJURY  . Knee arthroscopy  12/21/2011    Procedure: ARTHROSCOPY KNEE;  Surgeon: Tobi Bastos, MD;  Location: [facility];  Service: Orthopedics;  Laterality: Right;  WITH MEDIAL  MENISECTOMY  . Cataract extraction Bilateral   . Tonsillectomy      as child  . Ovarian cyst surgery      removed from left jaw area- benign  . Ovarian cyst surgery       NOTE; pt unaware of why this is in his chart  . Shoulder open rotator cuff repair Right 06/05/2014    Procedure: RIGHT ROTATOR CUFF REPAIR SHOULDER OPEN;  Surgeon: Latanya Maudlin, MD;  Location: [facility];  Service: Orthopedics;  Laterality: Right;  . Ercp N/A 08/09/2014    Procedure: ENDOSCOPIC RETROGRADE CHOLANGIOPANCREATOGRAPHY (ERCP);  Surgeon: Carol Ada, MD;  Location: [facility];  Service: Endoscopy;  Laterality: N/A;  . Eus N/A 08/09/2014    Procedure: UPPER ENDOSCOPIC ULTRASOUND (EUS) RADIAL;  Surgeon: Carol Ada, MD;  Location: [facility];  Service: Endoscopy;  Laterality: N/A;  . Fine needle aspiration N/A 08/09/2014    Procedure: FINE NEEDLE ASPIRATION (FNA) LINEAR;  Surgeon: Carol Ada, MD;  Location: [facility];  Service: Endoscopy;  Laterality: N/A;  . Esophagogastroduodenoscopy (egd) with propofol N/A 08/09/2014    Procedure: ESOPHAGOGASTRODUODENOSCOPY (EGD) WITH PROPOFOL;  Surgeon: Carol Ada, MD;  Location: [facility];  Service: Endoscopy;  Laterality: N/A;  . Bile duct stent placement    . Gastrostomy tube placement      There were no vitals filed  for this visit.  Visit Diagnosis:  Abnormality of gait  Dizziness and giddiness      Subjective Assessment - 04/22/15 1456    Subjective Pt has been using rollator for majority of community mobility "in the place of the wheelchair"; using rolling walker at home. Pt states, "I still have lightheadedness (5/10 at beginning of session) but I'm learning to deal with it." Pt reports he has not been performing HEP consistently due to wife not always being home to help him.   Patient is accompained by: Family member   Pertinent History metastatic lung cancer (Hospice)   Patient Stated Goals Pt: "To feel better. To walk without the walker"   Pt's wife:  For pt to tolerate being out of bed without being limited by dizziness/lightheadedness. Pt's BP was 110 "over something at home but pt hasn't had much fluids today 2/2 stomach pain.    Currently in Pain? No/denies                         [facility] Adult PT Treatment/Exercise - 04/22/15 0001    Ambulation/Gait   Ambulation/Gait Yes   Ambulation/Gait Assistance 6: Modified independent (Device/Increase time);5: Supervision;4: Min guard   Ambulation/Gait Assistance Details gait x300' over level, indoor and unlevel, outdoor surfaces with rollator and mod I. Gait x345' (consecutively) without AD wth close supervision to min guard.   Ambulation Distance (Feet) 645 Feet  300' then 345'   Assistive device Rolling walker;None   Gait Pattern Step-through pattern;Decreased stride length;Decreased trunk rotation;Right foot flat;Left foot flat;Decreased arm swing - right;Decreased arm swing - left  Gait deviations without AD   Ambulation Surface Level;Unlevel;Indoor;Outdoor;Paved   Stairs Yes   Stairs Assistance 5: Supervision   Stairs Assistance Details (indicate cue type and reason) Pt negotiated initial 4 stairs with single R rail (to simulate home entrance) and reciprocal pattern to ascend, step-to pattern to descend. Provided cueing for technique (lateral negotiation with BUE support at R rail). Pt with effective return demo.   Stair Management Technique One rail Right;Alternating pattern   Number of Stairs 8  4 stairs x2 trials   Height of Stairs 6   Gait Comments Cueing focused on B heel strike, increased hip/knee flexion during advancement of respective LE.         Vestibular Treatment/Exercise - 04/22/15 0001    Vestibular Treatment/Exercise   Vestibular Treatment Provided Gaze   Gaze Exercises X1 Viewing Horizontal;X1 Viewing Vertical   X1 Viewing Horizontal   Foot Position standing without UE support (rollator closeby)   Reps 30   X1 Viewing Vertical   Foot Position  standing   Reps 30            Balance Exercises - 04/22/15 1700    Balance Exercises: Standing   Standing Eyes Closed Wide (BOA);Solid surface;2 reps;30 secs           PT Education - 04/22/15 1519    Education provided Yes   Education Details HEP to perform independently (without wife standing next to him). See Pt Instructions for details.   Person(s) Educated Patient;Spouse   Methods Explanation;Demonstration;Verbal cues;Handout   Comprehension Verbalized understanding;Returned demonstration          PT Short Term Goals - 03/14/15 1333    PT SHORT TERM GOAL #1   Title STG's = LTG's           PT Long Term Goals - 04/22/15 1500    PT LONG TERM  GOAL #1   Title Pt will perform initial HEP with mod I using paper handout to maximize functional gains made in PT. Target date: 04/25/15   Baseline All unmet goals will be carried over to new POC: 04/27/15   Status On-going   PT LONG TERM GOAL #2   Title Rule out orthostatic hypotension as origin of symptoms. Target date: 04/11/15   Status Achieved   PT LONG TERM GOAL #3   Title Pt/wife will report pt ablity to tolerate being upright (seated) for > 50% of 12-hour day. Target date: 04/25/15   Baseline Pt currently tolerates being upright for ~10% of day.   Status Not Met   PT LONG TERM GOAL #4   Title Pt will ambulate 7' over level, indoor surfaces with supervision using LRAD with no more 2-point increase in subjective symptoms to indicate increased pt tolerance to household ambulation. Target date: 04/11/15   Baseline 12/23: gait x125' with min guard, initial 39' with RW, reamining distance without AD. Dizziness/lightheadedness decreased from 5/10 to 4/10.   Status Achieved   PT LONG TERM GOAL #5   Title Pt will decrease DHI score from 76% to 58% to indicate significant decrease in pt-perceived disability due to symptoms of lightheadedness. Target date: 04/25/15   Status On-going   PT LONG TERM GOAL #6   Title Pt will  ambulate 400' over even/uneven terrain at MOD I level with LRAD, with no more than 2-point increase in dizziness to improve functional mobility. Target date: 04/25/15   Status On-going               Plan - 04/22/15 1701    Clinical Impression Statement Session focused on gait training, stair training, and providing HEP pt can safely perform without wife providing supervision. Pt ambulated 300' over unlevel paved surfaces with rollator and mod I. Pt also ambulated x345' without AD with close supervision to min guard.   Pt will benefit from skilled therapeutic intervention in order to improve on the following deficits Dizziness;Decreased activity tolerance;Abnormal gait;Decreased mobility   Rehab Potential Good   Clinical Impairments Affecting Rehab Potential Pt has metastatic lung cancer and is currently on Hospice care, per chart   PT Frequency 2x / week   PT Duration 2 weeks   PT Treatment/Interventions ADLs/Self Care Home Management;Canalith Repostioning;DME Instruction;Gait training;Stair training;Functional mobility training;Therapeutic activities;Therapeutic exercise;Balance training;Vestibular;Neuromuscular re-education;Patient/family education   PT Next Visit Plan Complete goal assessment and recert (pt/wife agreeable to additional 4 weeks). Consider adding goal for ambulation without AD; stairs x4 with R rail.    Consulted and Agree with Plan of Care Patient;Family member/caregiver   Family Member Consulted wife, [name]        Problem List Patient Active Problem List   Diagnosis Date Noted  . Abnormality of gait 03/12/2015  . Lung cancer ([place])   . Protein-calorie malnutrition, severe ([place]) 11/26/2014  . Hypoalbuminemia 11/18/2014  . Leukocytosis 11/18/2014  . Dysphagia 11/18/2014  . Nausea with vomiting 10/03/2014  . Central line complication 56/25/6389  . Anorexia 10/03/2014  . Weight loss 10/03/2014  . Transaminitis 10/03/2014  . Hyperphosphatemia 10/03/2014  .  Encounter for antineoplastic immunotherapy 09/28/2014  . Non-small cell lung cancer ([place]) 09/21/2014  . Abdominal pain   . Fever   . 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 Biliary obstruction   . Fatigue 08/27/2014  . Abdominal pain, acute   . Abdominal mass   . Abdominal pain, generalized 08/07/2014  . CRF (chronic  renal failure) 08/07/2014  . Obstructive jaundice 08/07/2014  . Malnutrition of moderate degree ([place]) 08/07/2014  . Obstructive jaundice due to cancer 08/06/2014  . Rotator cuff tear, non-traumatic 06/05/2014  . Dizziness and giddiness 04/23/2014  . Tremor 04/23/2014  . Acute on chronic renal failure ([place]) 05/24/2013  . Pancytopenia ([place]) 05/24/2013  . Osteoarthritis of right knee 12/21/2011  . Meniscus, medial, posterior horn derangement 12/21/2011  . Dyspnea 12/04/2010  . Bronchogenic cancer of left lung ([place]) 12/11/2008  . ARTHRITIS 12/10/2008   Billie Ruddy, PT, DPT [facility] [address] [place], [place], [address] Phone: [phone]   Fax:  [phone] 04/22/2015, 5:05 PM   Name: [name] MRN: [id number] Date of Birth: May 06, 1945

## 2015-04-23 ENCOUNTER — Emergency Department (HOSPITAL_COMMUNITY): Payer: Medicare Other

## 2015-04-23 ENCOUNTER — Inpatient Hospital Stay (HOSPITAL_COMMUNITY)
Admission: EM | Admit: 2015-04-23 | Discharge: 2015-04-29 | DRG: 871 | Disposition: A | Discharge status: Home Health | Payer: Medicare Other | Attending: Internal Medicine | Admitting: Internal Medicine

## 2015-04-23 ENCOUNTER — Inpatient Hospital Stay (HOSPITAL_COMMUNITY): Payer: Medicare Other

## 2015-04-23 ENCOUNTER — Encounter (HOSPITAL_COMMUNITY): Payer: Self-pay

## 2015-04-23 DIAGNOSIS — C3492 Malignant neoplasm of unspecified part of left bronchus or lung: Secondary | ICD-10-CM | POA: Diagnosis present

## 2015-04-23 DIAGNOSIS — H9190 Unspecified hearing loss, unspecified ear: Secondary | ICD-10-CM | POA: Diagnosis present

## 2015-04-23 DIAGNOSIS — E860 Dehydration: Secondary | ICD-10-CM | POA: Diagnosis present

## 2015-04-23 DIAGNOSIS — R1013 Epigastric pain: Secondary | ICD-10-CM | POA: Diagnosis not present

## 2015-04-23 DIAGNOSIS — R7881 Bacteremia: Secondary | ICD-10-CM | POA: Diagnosis not present

## 2015-04-23 DIAGNOSIS — Z66 Do not resuscitate: Secondary | ICD-10-CM | POA: Diagnosis present

## 2015-04-23 DIAGNOSIS — Z923 Personal history of irradiation: Secondary | ICD-10-CM | POA: Diagnosis not present

## 2015-04-23 DIAGNOSIS — Z79899 Other long term (current) drug therapy: Secondary | ICD-10-CM | POA: Diagnosis not present

## 2015-04-23 DIAGNOSIS — I10 Essential (primary) hypertension: Secondary | ICD-10-CM | POA: Diagnosis present

## 2015-04-23 DIAGNOSIS — R6521 Severe sepsis with septic shock: Secondary | ICD-10-CM | POA: Diagnosis present

## 2015-04-23 DIAGNOSIS — C799 Secondary malignant neoplasm of unspecified site: Secondary | ICD-10-CM | POA: Diagnosis present

## 2015-04-23 DIAGNOSIS — Z87891 Personal history of nicotine dependence: Secondary | ICD-10-CM | POA: Diagnosis not present

## 2015-04-23 DIAGNOSIS — D539 Nutritional anemia, unspecified: Secondary | ICD-10-CM | POA: Diagnosis present

## 2015-04-23 DIAGNOSIS — E876 Hypokalemia: Secondary | ICD-10-CM | POA: Diagnosis present

## 2015-04-23 DIAGNOSIS — N179 Acute kidney failure, unspecified: Secondary | ICD-10-CM | POA: Diagnosis present

## 2015-04-23 DIAGNOSIS — Z931 Gastrostomy status: Secondary | ICD-10-CM

## 2015-04-23 DIAGNOSIS — A4159 Other Gram-negative sepsis: Secondary | ICD-10-CM | POA: Diagnosis present

## 2015-04-23 DIAGNOSIS — C3412 Malignant neoplasm of upper lobe, left bronchus or lung: Secondary | ICD-10-CM | POA: Diagnosis present

## 2015-04-23 DIAGNOSIS — A419 Sepsis, unspecified organism: Secondary | ICD-10-CM | POA: Diagnosis not present

## 2015-04-23 DIAGNOSIS — Z9221 Personal history of antineoplastic chemotherapy: Secondary | ICD-10-CM | POA: Diagnosis not present

## 2015-04-23 DIAGNOSIS — E440 Moderate protein-calorie malnutrition: Secondary | ICD-10-CM

## 2015-04-23 DIAGNOSIS — R109 Unspecified abdominal pain: Secondary | ICD-10-CM

## 2015-04-23 DIAGNOSIS — E43 Unspecified severe protein-calorie malnutrition: Secondary | ICD-10-CM | POA: Diagnosis present

## 2015-04-23 DIAGNOSIS — R509 Fever, unspecified: Secondary | ICD-10-CM | POA: Diagnosis not present

## 2015-04-23 DIAGNOSIS — Z681 Body mass index (BMI) 19 or less, adult: Secondary | ICD-10-CM

## 2015-04-23 DIAGNOSIS — R740 Nonspecific elevation of levels of transaminase and lactic acid dehydrogenase [LDH]: Secondary | ICD-10-CM | POA: Diagnosis present

## 2015-04-23 DIAGNOSIS — C3490 Malignant neoplasm of unspecified part of unspecified bronchus or lung: Secondary | ICD-10-CM

## 2015-04-23 DIAGNOSIS — R42 Dizziness and giddiness: Secondary | ICD-10-CM

## 2015-04-23 DIAGNOSIS — R7401 Elevation of levels of liver transaminase levels: Secondary | ICD-10-CM

## 2015-04-23 DIAGNOSIS — A498 Other bacterial infections of unspecified site: Secondary | ICD-10-CM | POA: Diagnosis present

## 2015-04-23 DIAGNOSIS — B961 Klebsiella pneumoniae [K. pneumoniae] as the cause of diseases classified elsewhere: Secondary | ICD-10-CM | POA: Diagnosis present

## 2015-04-23 LAB — URINALYSIS, ROUTINE W REFLEX MICROSCOPIC
GLUCOSE, UA: NEGATIVE mg/dL
HGB URINE DIPSTICK: NEGATIVE
KETONES UR: NEGATIVE mg/dL
Leukocytes, UA: NEGATIVE
Nitrite: NEGATIVE
PROTEIN: NEGATIVE mg/dL
Specific Gravity, Urine: 1.017 (ref 1.005–1.030)
pH: 6 (ref 5.0–8.0)

## 2015-04-23 LAB — CBC WITH DIFFERENTIAL/PLATELET
Basophils Absolute: 0 10*3/uL (ref 0.0–0.1)
Basophils Relative: 0 %
EOS ABS: 0 10*3/uL (ref 0.0–0.7)
Eosinophils Relative: 0 %
HCT: 34.1 % — ABNORMAL LOW (ref 39.0–52.0)
Hemoglobin: 11.4 g/dL — ABNORMAL LOW (ref 13.0–17.0)
LYMPHS ABS: 0.4 10*3/uL — AB (ref 0.7–4.0)
Lymphocytes Relative: 3 %
MCH: 34.7 pg — AB (ref 26.0–34.0)
MCHC: 33.4 g/dL (ref 30.0–36.0)
MCV: 103.6 fL — ABNORMAL HIGH (ref 78.0–100.0)
MONOS PCT: 5 %
Monocytes Absolute: 0.6 10*3/uL (ref 0.1–1.0)
NEUTROS PCT: 92 %
Neutro Abs: 11.7 10*3/uL — ABNORMAL HIGH (ref 1.7–7.7)
Platelets: 150 10*3/uL (ref 150–400)
RBC: 3.29 MIL/uL — ABNORMAL LOW (ref 4.22–5.81)
RDW: 14.1 % (ref 11.5–15.5)
WBC: 12.7 10*3/uL — ABNORMAL HIGH (ref 4.0–10.5)

## 2015-04-23 LAB — COMPREHENSIVE METABOLIC PANEL
ALT: 402 U/L — ABNORMAL HIGH (ref 17–63)
ANION GAP: 13 (ref 5–15)
AST: 226 U/L — ABNORMAL HIGH (ref 15–41)
Albumin: 3.1 g/dL — ABNORMAL LOW (ref 3.5–5.0)
Alkaline Phosphatase: 501 U/L — ABNORMAL HIGH (ref 38–126)
BILIRUBIN TOTAL: 4.5 mg/dL — AB (ref 0.3–1.2)
BUN: 41 mg/dL — ABNORMAL HIGH (ref 6–20)
CO2: 21 mmol/L — ABNORMAL LOW (ref 22–32)
Calcium: 8.8 mg/dL — ABNORMAL LOW (ref 8.9–10.3)
Chloride: 105 mmol/L (ref 101–111)
Creatinine, Ser: 1.39 mg/dL — ABNORMAL HIGH (ref 0.61–1.24)
GFR calc Af Amer: 58 mL/min — ABNORMAL LOW (ref >60)
GFR, EST NON AFRICAN AMERICAN: 50 mL/min — AB (ref >60)
Glucose, Bld: 79 mg/dL (ref 65–99)
POTASSIUM: 3.6 mmol/L (ref 3.5–5.1)
Sodium: 139 mmol/L (ref 135–145)
TOTAL PROTEIN: 5.7 g/dL — AB (ref 6.5–8.1)

## 2015-04-23 LAB — LACTIC ACID, PLASMA: LACTIC ACID, VENOUS: 1.9 mmol/L (ref 0.5–2.0)

## 2015-04-23 LAB — TROPONIN I: TROPONIN I: 0.03 ng/mL (ref <0.031)

## 2015-04-23 LAB — I-STAT CG4 LACTIC ACID, ED: Lactic Acid, Venous: 4.15 mmol/L (ref 0.5–2.0)

## 2015-04-23 LAB — PROCALCITONIN: PROCALCITONIN: 23.94 ng/mL

## 2015-04-23 MED ORDER — SODIUM CHLORIDE 0.9 % IV SOLN
INTRAVENOUS | Status: DC
  Administered 2015-04-23: 22:00:00 via INTRAVENOUS

## 2015-04-23 MED ORDER — SODIUM CHLORIDE 0.9 % IV SOLN: 250.0000 mL | INTRAVENOUS | Status: DC | PRN

## 2015-04-23 MED ORDER — HEPARIN SODIUM (PORCINE) 5000 UNIT/ML IJ SOLN
5000.0000 [IU] | Freq: Three times a day (TID) | INTRAMUSCULAR | Status: DC
Start: 2015-04-23 — End: 2015-04-29
  Administered 2015-04-23 – 2015-04-29 (×17): 5000 [IU] via SUBCUTANEOUS
  Filled 2015-04-23 (×19): qty 1

## 2015-04-23 MED ORDER — SODIUM CHLORIDE 0.9 % IV BOLUS (SEPSIS)
1000.0000 mL | Freq: Once | INTRAVENOUS | Status: AC
  Administered 2015-04-23: 1000 mL via INTRAVENOUS

## 2015-04-23 MED ORDER — ONDANSETRON HCL 4 MG/2ML IJ SOLN
4.0000 mg | Freq: Four times a day (QID) | INTRAMUSCULAR | Status: DC | PRN
  Administered 2015-04-24 – 2015-04-28 (×11): 4 mg via INTRAVENOUS
  Filled 2015-04-23 (×12): qty 2

## 2015-04-23 MED ORDER — VANCOMYCIN HCL IN DEXTROSE 1-5 GM/200ML-% IV SOLN: 1000.0000 mg | Freq: Once | INTRAVENOUS | Status: DC

## 2015-04-23 MED ORDER — DEXTROSE 5 % IV SOLN
0.0000 ug/min | Freq: Once | INTRAVENOUS | Status: DC
  Filled 2015-04-23: qty 1

## 2015-04-23 MED ORDER — DEXTROSE 5 % IV SOLN
2.0000 g | Freq: Three times a day (TID) | INTRAVENOUS | Status: DC
  Administered 2015-04-24 – 2015-04-27 (×10): 2 g via INTRAVENOUS
  Filled 2015-04-23 (×12): qty 2

## 2015-04-23 MED ORDER — ASPIRIN 300 MG RE SUPP
300.0000 mg | RECTAL | Status: AC
  Administered 2015-04-23: 300 mg via RECTAL
  Filled 2015-04-23: qty 1

## 2015-04-23 MED ORDER — IOHEXOL 300 MG/ML  SOLN
100.0000 mL | Freq: Once | INTRAMUSCULAR | Status: AC | PRN
  Administered 2015-04-23: 100 mL via INTRAVENOUS

## 2015-04-23 MED ORDER — DEXTROSE 5 % IV SOLN
500.0000 mg | INTRAVENOUS | Status: DC
  Administered 2015-04-24: 500 mg via INTRAVENOUS
  Filled 2015-04-23: qty 500

## 2015-04-23 MED ORDER — LEVOFLOXACIN IN D5W 750 MG/150ML IV SOLN
750.0000 mg | INTRAVENOUS | Status: DC
  Filled 2015-04-23: qty 150

## 2015-04-23 MED ORDER — MIDODRINE HCL 5 MG PO TABS
5.0000 mg | ORAL_TABLET | Freq: Three times a day (TID) | ORAL | Status: DC
  Administered 2015-04-24 – 2015-04-25 (×4): 5 mg via ORAL
  Filled 2015-04-23 (×11): qty 1

## 2015-04-23 MED ORDER — VANCOMYCIN HCL IN DEXTROSE 750-5 MG/150ML-% IV SOLN: 750.0000 mg | INTRAVENOUS | Status: DC

## 2015-04-23 MED ORDER — PANTOPRAZOLE SODIUM 40 MG IV SOLR
40.0000 mg | Freq: Every day | INTRAVENOUS | Status: DC
  Administered 2015-04-23 – 2015-04-28 (×6): 40 mg via INTRAVENOUS
  Filled 2015-04-23 (×6): qty 40

## 2015-04-23 MED ORDER — IOHEXOL 300 MG/ML  SOLN
25.0000 mL | Freq: Once | INTRAMUSCULAR | Status: AC | PRN
  Administered 2015-04-23: 25 mL via ORAL

## 2015-04-23 MED ORDER — DEXTROSE 5 % IV SOLN
2.0000 g | Freq: Once | INTRAVENOUS | Status: AC
  Administered 2015-04-23: 2 g via INTRAVENOUS
  Filled 2015-04-23: qty 2

## 2015-04-23 MED ORDER — DEXTROSE 5 % IV SOLN
500.0000 mg | Freq: Once | INTRAVENOUS | Status: AC
  Administered 2015-04-23: 500 mg via INTRAVENOUS
  Filled 2015-04-23: qty 500

## 2015-04-23 MED ORDER — NOREPINEPHRINE BITARTRATE 1 MG/ML IV SOLN
5.0000 ug/min | INTRAVENOUS | Status: DC
  Administered 2015-04-23: 5 ug/min via INTRAVENOUS
  Filled 2015-04-23 (×2): qty 4

## 2015-04-23 MED ORDER — ASPIRIN 81 MG PO CHEW
324.0000 mg | CHEWABLE_TABLET | ORAL | Status: AC
  Filled 2015-04-23: qty 4

## 2015-04-23 NOTE — ED Notes (Signed)
Pt transported to [id number], CT is scheduled for 10:45; pt stable, wife at bedside

## 2015-04-23 NOTE — ED Notes (Signed)
MD at bedside. 

## 2015-04-23 NOTE — ED Notes (Signed)
Called code sepsis

## 2015-04-23 NOTE — Progress Notes (Addendum)
ANTIBIOTIC CONSULT NOTE - INITIAL  Pharmacy Consult for vancomycin and aztreonam Indication: rule out sepsis  Allergies  Allergen Reactions  . Augmentin [Amoxicillin-Pot Clavulanate] Diarrhea  . Carboplatin     Rash and itching after carbo test dose  . Levaquin [Levofloxacin] Other (See Comments)    Knee pain  . Percocet [Oxycodone-Acetaminophen] Nausea And Vomiting  . Scopolamine     hallucinations  . Zolpidem Tartrate Diarrhea    Patient Measurements: Height: 5' 7.5" (171.5 cm) Weight: 114 lb (51.71 kg) IBW/kg (Calculated) : 67.25  Vital Signs: Temp: 98.1 F (36.7 C) (01/25 1835) Temp Source: Oral (01/25 1835) BP: 84/61 mmHg (01/25 1906) Pulse Rate: 105 (01/25 1852) Intake/Output from previous day:   Intake/Output from this shift:    Labs: No results for input(s): WBC, HGB, PLT, LABCREA, CREATININE in the last 72 hours. CrCl cannot be calculated (Patient has no serum creatinine result on file.). No results for input(s): VANCOTROUGH, VANCOPEAK, VANCORANDOM, GENTTROUGH, GENTPEAK, GENTRANDOM, TOBRATROUGH, TOBRAPEAK, TOBRARND, AMIKACINPEAK, AMIKACINTROU, AMIKACIN in the last 72 hours.   Microbiology: No results found for this or any previous visit (from the past 720 hour(s)).  Medical History: Past Medical History  Diagnosis Date  . Hypertension   . Arthritis RIGHT SHOULDER  . Immature cataract BILATERAL  . Non-small cell lung cancer ([place]) DX SEPT 2010  W/ CHEMORADIATION AT THAT TIME -- NOW  W/ METS--  CURRENTLY ON MAINTENANCE CHEMO TX  EVERY 30 DAYS    ONCOLOGIST- DR [name]  . Acute meniscal tear of knee RIGHT KNEE  . BPH (benign prostatic hypertrophy)   . Dizziness and giddiness 04/23/2014  . Tremor 04/23/2014    Right hand  . HOH (hard of hearing)     bilateral hearing aids  . Radiation 01/08/09-01/21/09    Mediastinum 30 Gy x 12 fractions  . Radiation 08/19/14-09/04/14    Palliative RT Porta hepatic LN 30 Gy  . Pneumonia   . Abnormality of gait 03/12/2015    Assessment: Patient's a 70 y.o M presented to the ED on 1/25 with c/o fever, weakness and abdominal pain.  He was hypotensive on arrival to the ED.  To start vancomycin and aztreonam for suspected sepsis.  - wbc 12.7, scr 1.39 (crcl~36) - LA 4.15  1/25 vanc>> 1/25 aztreonam>>  1/25 bcx x2: 1/25 ucx:  Goal of Therapy:  Vancomycin trough level 15-20 mcg/ml  Plan:  - vancomycin 1 gm IV x1, then 750 mg IV q24h - aztreonam 2 gm IV q8h  Angelissa Supan P 04/23/2015,7:10 PM  Adden: To add levaquin to abx regimen.  Will initiate levaquin 750 mg IV q48h. Dia Sitter, PharmD, BCPS 04/23/2015 8:53 PM

## 2015-04-23 NOTE — H&P (Signed)
PULMONARY / CRITICAL CARE MEDICINE   Name: Nyaire Denbleyker MRN: [id number] DOB: [birth year]    ADMISSION DATE:  04/23/2015 CONSULTATION DATE:  04/23/15  REFERRING MD:  EDP  CHIEF COMPLAINT:  Fever, weakness, abd pain, nausea.  HISTORY OF PRESENT ILLNESS:   Tennis Mckinnon is a 70 y.o. male with PMH as outlined below including metastatic NSCLC initially diagnosed Sept 2010 (adeno - sees Dr. [name]).   He is s/p chemo and XRT and was on maintenance chemo and immunotherapy up until what appears to be Sept 2016.  Per oncology notes (office visit from 12/13/14), these were discontinued due to disease progression.  He was then referred to hospice / palliative care.  He has apparently been on hospice since but per family, had been doing well enough that hospice had considered discharging him. He has PEG in place and gets 100% of nutrition this way, as family thinks his swallowing was damaged by endoscopy. Intake has been decreased last few days due to his nausea. PEG has been leaking somewhat recently.  On 04/23/15, he presented to [facility] ED via EMS for fever (Tmax 102), weakness, abd pain x 1 week, and nausea.  On EMS arrival he was also hypotensive (60/40 manual). Baseline BP is 90s/60s.  In ED, he remained hypotensive with SBP in mid 70's.   His workup also revealed lactate 4.15, WBC 12.7, AKI with SCr 1.39, transaminitis.  UA pending.  His is full DNR; however, family requested vasopressor initiation after EDP discussed options with them; therefore, PCCM was called for admission.  PAST MEDICAL HISTORY :  He  has a past medical history of Hypertension; Arthritis (RIGHT SHOULDER); Immature cataract (BILATERAL); Non-small cell lung cancer ([place]) (DX SEPT 2010  W/ CHEMORADIATION AT THAT TIME -- NOW  W/ METS--  CURRENTLY ON MAINTENANCE CHEMO TX  EVERY 30 DAYS); Acute meniscal tear of knee (RIGHT KNEE); BPH (benign prostatic hypertrophy); Dizziness and giddiness (04/23/2014); Tremor (04/23/2014); HOH (hard of  hearing); Radiation (01/08/09-01/21/09); Radiation (08/19/14-09/04/14); Pneumonia; and Abnormality of gait (03/12/2015).  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  Allergies  Allergen Reactions  . Augmentin [Amoxicillin-Pot Clavulanate] Diarrhea  . Carboplatin     Rash and itching after carbo test dose  . Levaquin [Levofloxacin] Other (See Comments)    Knee pain  . Percocet [Oxycodone-Acetaminophen] Nausea And Vomiting  . Scopolamine     hallucinations  . Zolpidem Tartrate Diarrhea    No current facility-administered medications on file prior to encounter.   Current Outpatient Prescriptions on File Prior to Encounter  Medication Sig  . b complex vitamins tablet Take 1 tablet by mouth daily.  . influenza vac split quadrivalent PF (FLUARIX) 0.5 ML injection Inject 0.5 mLs into the muscle once.  . metoCLOPramide (REGLAN) 10 MG tablet Take 1 tablet (10 mg total) by mouth every 8 (eight) hours as needed for nausea. (Patient taking differently: Take 10 mg by mouth 2 (two) times daily. )  . midodrine (PROAMATINE) 5 MG tablet Take 5 mg by mouth 3 (three) times daily with meals.  . Multiple Vitamin (MULTIVITAMIN)  tablet Take 1 tablet by mouth every morning.   . Nutritional Supplements (FEEDING SUPPLEMENT, OSMOLITE 1.5 CAL,) LIQD Begin Osmolite 1.5 at 20 ml/hr continuous infusion via PEG for 12 hours overnight from 8 pm to  8 am. Increase 10 cc daily to initial goal of 95 cc/hr for 15 hours daily as tolerated.  Flush feeding tube with 240 cc free water 6 times daily. This provides 100% estimated needs. Please send feeding pump and instruct patient on how to use pump. Send TF supplies. Do not send formula.  Marland Kitchen omeprazole (PRILOSEC) 40 MG capsule Take 1 capsule (40 mg total) by mouth every evening.  . ondansetron (ZOFRAN) 8 MG tablet Take 1 tablet (8 mg total) by mouth every 8 (eight) hours as needed for nausea or vomiting.  . prochlorperazine (COMPAZINE) 10 MG tablet Take 1 tablet (10 mg total) by mouth every 6 (six) hours as needed for nausea or vomiting.  . triamcinolone (NASACORT ALLERGY 24HR) 55 MCG/ACT AERO nasal inhaler Place 1 spray into the nose daily as needed (congestion.).  Marland Kitchen topiramate (TOPAMAX) 25 MG tablet One tablet at night for 1 week, then take 1 tablet twice a day (Patient not taking: Reported on 04/23/2015)    FAMILY HISTORY:  His indicated that his mother is deceased. He indicated that his father is deceased. He indicated that his sister is alive. He indicated that his brother is deceased. He indicated that his maternal grandfather is deceased.   SOCIAL HISTORY: He  reports that he quit smoking about 34 years ago. His smoking use included Cigarettes. He has a 40 pack-year smoking history. He has never used smokeless tobacco. He reports that he does not drink alcohol or use illicit drugs.  REVIEW OF SYSTEMS:   Bolds are positive  Constitutional: weight loss, gain, night sweats, Fevers, chills, fatigue .  HEENT: headaches, Sore throat, sneezing, nasal congestion, post nasal drip, Difficulty swallowing, Tooth/dental problems, visual complaints visual changes, ear ache CV:  chest pain, radiates: ,Orthopnea, PND, swelling in lower extremities, dizziness, palpitations, syncope.  GI  heartburn, indigestion, abdominal pain, nausea, vomiting, diarrhea, change in bowel habits, loss of appetite, bloody stools.   Resp: cough, productive: , hemoptysis, dyspnea, chest pain, pleuritic.  Skin: rash or itching or icterus GU: dysuria, change in color of urine, urgency or frequency. flank pain, hematuria  MS: joint pain or swelling. decreased range of motion  Psych: change in mood or affect. depression or anxiety.  Neuro: difficulty with speech, weakness, numbness, ataxia   SUBJECTIVE:    VITAL SIGNS: BP 75/59 mmHg  Pulse 103  Temp(Src) 98.4 F (36.9 C) (Axillary)  Resp 17  Ht 5' 7.5" (1.715 m)  Wt 51.71 kg (114 lb)  BMI 17.58 kg/m2  SpO2 100%  HEMODYNAMICS:    VENTILATOR SETTINGS:    INTAKE / OUTPUT:     PHYSICAL EXAMINATION: General: cachectic male in NAD Neuro: Alert, oriented, non-focal HEENT: [place]/AT, PERRL, no JVD noted. Cardiovascular: RRR, no MRG Lungs: Clear bilateral breath sounds Abdomen: Soft, tender RUQ and RLQ, non-distended Musculoskeletal: No acute deformity or ROM limitation Skin: Grossly intact  LABS:  BMET No results for input(s): NA, K, CL, CO2, BUN, CREATININE, GLUCOSE in the last 168 hours.  Electrolytes No results for input(s): CALCIUM, MG, PHOS in the last 168 hours.  CBC  Recent Labs Lab 04/23/15 1903  WBC 12.7*  HGB 11.4*  HCT 34.1*  PLT 150    Coag's No results for input(s): APTT, INR in the last 168 hours.  Sepsis Markers  Recent Labs Lab 04/23/15 1912  LATICACIDVEN 4.15*    ABG No results for input(s): PHART, PCO2ART, PO2ART in the last 168 hours.  Liver Enzymes No results for input(s): AST, ALT, ALKPHOS, BILITOT, ALBUMIN in the last 168 hours.  Cardiac Enzymes No results for input(s): TROPONINI, PROBNP in the last 168 hours.  Glucose No results for input(s): GLUCAP in the last 168 hours.  Imaging No results found.   STUDIES:  CXR 01/25 > no acute process. Mediastinal LAN appears improved. CT abd 1/25 >>>  CULTURES: Blood 01/25 >  Urine 01/25 > Sputum 01/25 >  ANTIBIOTICS: Aztreonam 01/25 > Levaquin 1/25  > Vancomycin 01/25 >  SIGNIFICANT EVENTS: 01/25 > admitted with sepsis / probable septic shock of unclear etiology.  LINES/TUBES: Port >  ASSESSMENT / PLAN:  CARDIOVASCULAR A:  Sepsis of unclear etiology with concern for developing shock despite 3L IVF Hypotensive at baseline on midodrine (90s/60s) DNR status. P:  Tele Levophed as needed to maintain goal MAP > 60. Give one additional liter NS (4 total) then 187m/Hr Assess cortisol. Trend lactate, troponin. Continue home midodrine   INFECTIOUS A:   Sepsis of unclear etiology with concern for developing shock, concern for intraabdominal source P:   Abx as above (vanc /aztreonam/levaquin). Follow cultures and UA. PCT algorithm to limit abx exposure. CT abd/pelvis  PULMONARY A: Metastatic NSCLC (adeno) - followed by Dr. MJulien Nordmann  Was previously on chemo and XRT; however, this was stopped Sept 2016 due to disease progression.  He was then referred to hospice. DNI status. P:   Palliative care consult in AM Supplemental O2 as needed to keep SpO2 > 92%  RENAL A:   AKI. Pseudohypocalcemia - corrects to 9.52.  P:   IVF hydration Follow BMP  GASTROINTESTINAL A:   Abd pain with nausea Transaminitis Nutrition P:   CT abd pelvis Trend LFT's. NPO, start TF in AM PRN zofran  HEMATOLOGIC / ONCOLOGIC A:   Mild macrocytic anemia. VTE Prophylaxis. Metastatic NSCLC (adeno) - followed by Dr. MJulien Nordmann  Was previously on chemo and XRT; however, this was stopped Sept 2016 due to disease progression.  He was then referred to hospice. P:  Transfuse for Hgb < 7. SCD's / Heparin. CBC in AM. Palliative care consult for goals of care.  ENDOCRINE A:   No acute issues. P:   Assess TSH, cortisol.  NEUROLOGIC A:   No acute issues  P:   Monitor  Family updated: Wife and patient updated in ED by [facility]  Interdisciplinary Family Meeting v Palliative Care Meeting:  Due by: 2/1  APP Critical Care Time : 45  mins   PGeorgann Housekeeper AGACNP-BC LSt. Jude Medical CenterPulmonology/Critical Care Pager [phone] ([phone] 04/23/2015 8:21 PM

## 2015-04-23 NOTE — ED Notes (Signed)
Pt. Made aware for the need of urine. 

## 2015-04-23 NOTE — ED Provider Notes (Signed)
CSN: [id number]     Arrival date & time 04/23/15  1828 History   First MD Initiated Contact with Patient 04/23/15 1840     Chief Complaint  Patient presents with  . Fever  . Hypotension  . Abdominal Pain  . Nausea     (Consider location/radiation/quality/duration/timing/severity/associated sxs/prior Treatment) Patient is a 70 y.o. male presenting with fever and abdominal pain. The history is provided by the patient, the spouse and the EMS personnel.  Fever Associated symptoms: no chest pain, no confusion, no congestion, no dysuria, no headaches and no rash   Abdominal Pain Associated symptoms: fatigue and fever   Associated symptoms: no chest pain, no dysuria and no shortness of breath    patient brought in by EMS. Patient was sudden onset of high fever to 103 as per spouse. Any dropping his blood pressure. EMS upon arrival had a blood pressure of systolic 60. Was given 800 mL of normal saline pressure 100-102. Patient has a history of end-stage lung CA. Is a DO NOT RESUSCITATE. Was on hospice but was about rated be moved out because of his improvement. Patient was feeling fine yesterday everything started suddenly today. No other specific complaints no nausea vomiting or diarrhea no abdominal pain no chest pain no upper respiratory symptoms. Patient denied feeling short of breath. However on room air sats he was 88%. On 2 L satting in the 90s.  Past Medical History  Diagnosis Date  . Hypertension   . Arthritis RIGHT SHOULDER  . Immature cataract BILATERAL  . Non-small cell lung cancer ([place]) DX SEPT 2010  W/ CHEMORADIATION AT THAT TIME -- NOW  W/ METS--  CURRENTLY ON MAINTENANCE CHEMO TX  EVERY 30 DAYS    ONCOLOGIST- DR [name]  . Acute meniscal tear of knee RIGHT KNEE  . BPH (benign prostatic hypertrophy)   . Dizziness and giddiness 04/23/2014  . Tremor 04/23/2014    Right hand  . HOH (hard of hearing)     bilateral hearing aids  . Radiation 01/08/09-01/21/09    Mediastinum 30 Gy x  12 fractions  . Radiation 08/19/14-09/04/14    Palliative RT Porta hepatic LN 30 Gy  . Pneumonia   . Abnormality of gait 03/12/2015   Past Surgical History  Procedure Laterality Date  . 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 Rotator cuff repair  2008    LEFT SHOULDER  . Bilateral ear drum surgery  1960'S  . Orif left ring finger  and revascularization of radial side  02-16-2007    DEGLOVING INJURY  . Knee arthroscopy  12/21/2011    Procedure: ARTHROSCOPY KNEE;  Surgeon: Tobi Bastos, MD;  Location: [facility];  Service: Orthopedics;  Laterality: Right;  WITH MEDIAL MENISECTOMY  . Cataract extraction Bilateral   . Tonsillectomy      as child  . Ovarian cyst surgery      removed from left jaw area- benign  . Ovarian cyst surgery       NOTE; pt unaware of why this is in his chart  . Shoulder open rotator cuff repair Right 06/05/2014    Procedure: RIGHT ROTATOR CUFF REPAIR SHOULDER OPEN;  Surgeon: Latanya Maudlin, MD;  Location: [facility];  Service: Orthopedics;  Laterality: Right;  . Ercp N/A 08/09/2014    Procedure: ENDOSCOPIC RETROGRADE CHOLANGIOPANCREATOGRAPHY (ERCP);  Surgeon: Carol Ada, MD;  Location: [facility];  Service: Endoscopy;  Laterality: N/A;  .  Eus N/A 08/09/2014    Procedure: UPPER ENDOSCOPIC ULTRASOUND (EUS) RADIAL;  Surgeon: Carol Ada, MD;  Location: [facility];  Service: Endoscopy;  Laterality: N/A;  . Fine needle aspiration N/A 08/09/2014    Procedure: FINE NEEDLE ASPIRATION (FNA) LINEAR;  Surgeon: Carol Ada, MD;  Location: [facility];  Service: Endoscopy;  Laterality: N/A;  . Esophagogastroduodenoscopy (egd) with propofol N/A 08/09/2014    Procedure: ESOPHAGOGASTRODUODENOSCOPY (EGD) WITH PROPOFOL;  Surgeon: Carol Ada, MD;  Location: [facility];  Service: Endoscopy;  Laterality: N/A;  . Bile duct stent placement    . Gastrostomy tube placement     Family History   Problem Relation Age of Onset  . Colon cancer    . Heart attack    . Cancer    . Hypertension    . Hyperlipidemia    . Congestive Heart Failure Mother   . Cancer Sister   . Cancer Maternal Grandfather   . Colon cancer Father   . Heart attack Brother    Social History  Substance Use Topics  . Smoking status: Former Smoker -- 2.00 packs/day for 20 years    Types: Cigarettes    Quit date: 03/17/1981  . Smokeless tobacco: Never Used  . Alcohol Use: No     Comment: beer daily    Review of Systems  Constitutional: Positive for fever and fatigue.  HENT: Negative for congestion.   Eyes: Negative for redness.  Respiratory: Negative for shortness of breath.   Cardiovascular: Negative for chest pain.  Gastrointestinal: Negative for abdominal pain.  Genitourinary: Negative for dysuria.  Musculoskeletal: Negative for back pain.  Skin: Negative for rash.  Neurological: Negative for headaches.  Psychiatric/Behavioral: Negative for confusion.      Allergies  Augmentin; Carboplatin; Levaquin; Percocet; Scopolamine; and Zolpidem tartrate  Home Medications   Prior to Admission medications   Medication Sig Start Date End Date Taking? Authorizing Provider  b complex vitamins tablet Take 1 tablet by mouth daily.   Yes Historical Provider, MD  diphenhydramine-acetaminophen (TYLENOL PM) 25-500 MG TABS tablet Take 1 tablet by mouth at bedtime as needed (sleep.).   Yes Historical Provider, MD  influenza vac split quadrivalent PF (FLUARIX) 0.5 ML injection Inject 0.5 mLs into the muscle once. 03/21/15  Yes [name], MD  LORazepam (ATIVAN) 1 MG tablet Take 1 mg by mouth at bedtime.   Yes Historical Provider, MD  metoCLOPramide (REGLAN) 10 MG tablet Take 1 tablet (10 mg total) by mouth every 8 (eight) hours as needed for nausea. Patient taking differently: Take 10 mg by mouth 2 (two) times daily.  12/17/14  Yes [name], MD  midodrine (PROAMATINE) 5 MG tablet Take 5 mg by mouth 3  (three) times daily with meals.   Yes Historical Provider, MD  Multiple Vitamin (MULTIVITAMIN) tablet Take 1 tablet by mouth every morning.    Yes Historical Provider, MD  Nutritional Supplements (FEEDING SUPPLEMENT, OSMOLITE 1.5 CAL,) LIQD Begin Osmolite 1.5 at 20 ml/hr continuous infusion via PEG for 12 hours overnight from 8 pm to 8 am. Increase 10 cc daily to initial goal of 95 cc/hr for 15 hours daily as tolerated.  Flush feeding tube with 240 cc free water 6 times daily. This provides 100% estimated needs. Please send feeding pump and instruct patient on how to use pump. Send TF supplies. Do not send formula. 11/07/14  Yes [name], MD  omeprazole (PRILOSEC) 40 MG capsule Take 1 capsule (40 mg total) by mouth every evening. 11/07/14  Yes Adrena  E Johnson, PA-C  ondansetron (ZOFRAN) 8 MG tablet Take 1 tablet (8 mg total) by mouth every 8 (eight) hours as needed for nausea or vomiting. 11/22/14  Yes Ripudeep Krystal Eaton, MD  prochlorperazine (COMPAZINE) 10 MG tablet Take 1 tablet (10 mg total) by mouth every 6 (six) hours as needed for nausea or vomiting. 10/15/14  Yes [name], MD  sennosides-docusate sodium (SENOKOT-S) 8.6-50 MG tablet Take 1 tablet by mouth daily as needed for constipation.   Yes Historical Provider, MD  triamcinolone (NASACORT ALLERGY 24HR) 55 MCG/ACT AERO nasal inhaler Place 1 spray into the nose daily as needed (congestion.).   Yes Historical Provider, MD  topiramate (TOPAMAX) 25 MG tablet One tablet at night for 1 week, then take 1 tablet twice a day Patient not taking: Reported on 04/23/2015 03/12/15   Kathrynn Ducking, MD   BP 75/59 mmHg  Pulse 103  Temp(Src) 98.4 F (36.9 C) (Axillary)  Resp 17  Ht 5' 7.5" (1.715 m)  Wt 51.71 kg  BMI 17.58 kg/m2  SpO2 100% Physical Exam  Constitutional: He is oriented to person, place, and time. No distress.  Patient very thin.  HENT:  Head: Normocephalic and atraumatic.  Mouth/Throat: Oropharynx is clear and moist.  Eyes:  Conjunctivae and EOM are normal. Pupils are equal, round, and reactive to light. Scleral icterus is present.  Neck: Normal range of motion. Neck supple.  Cardiovascular: Normal rate, regular rhythm and normal heart sounds.   No murmur heard. Pulmonary/Chest: Effort normal and breath sounds normal. No respiratory distress.  Port-A-Cath right upper chest.  Abdominal: Soft. Bowel sounds are normal. There is no tenderness.  G-tube left side of abdomen.  Musculoskeletal: Normal range of motion.  Neurological: He is alert and oriented to person, place, and time. No cranial nerve deficit. He exhibits normal muscle tone. Coordination normal.  Skin: Skin is warm. No rash noted.  Nursing note and vitals reviewed.   ED Course  Procedures (including critical care time) Labs Review Labs Reviewed  COMPREHENSIVE METABOLIC PANEL - Abnormal; Notable for the following:    CO2 21 (*)    BUN 41 (*)    Creatinine, Ser 1.39 (*)    Calcium 8.8 (*)    Total Protein 5.7 (*)    Albumin 3.1 (*)    AST 226 (*)    ALT 402 (*)    Alkaline Phosphatase 501 (*)    Total Bilirubin 4.5 (*)    GFR calc non Af Amer 50 (*)    GFR calc Af Amer 58 (*)    All other components within normal limits  CBC WITH DIFFERENTIAL/PLATELET - Abnormal; Notable for the following:    WBC 12.7 (*)    RBC 3.29 (*)    Hemoglobin 11.4 (*)    HCT 34.1 (*)    MCV 103.6 (*)    MCH 34.7 (*)    Neutro Abs 11.7 (*)    Lymphs Abs 0.4 (*)    All other components within normal limits  I-STAT CG4 LACTIC ACID, ED - Abnormal; Notable for the following:    Lactic Acid, Venous 4.15 (*)    All other components within normal limits  CULTURE, BLOOD (ROUTINE X 2)  CULTURE, BLOOD (ROUTINE X 2)  URINE CULTURE  URINALYSIS, ROUTINE W REFLEX MICROSCOPIC (NOT AT [facility])  CBC  BASIC METABOLIC PANEL  MAGNESIUM  PHOSPHORUS  HEPATIC FUNCTION PANEL  CORTISOL  TROPONIN I  TROPONIN I  TROPONIN I  LACTIC ACID, PLASMA  LACTIC  ACID, PLASMA   PROCALCITONIN  PROCALCITONIN   Results for orders placed or performed during the hospital encounter of 04/23/15  Comprehensive metabolic panel  Result Value Ref Range   Sodium 139 135 - 145 mmol/L   Potassium 3.6 3.5 - 5.1 mmol/L   Chloride 105 101 - 111 mmol/L   CO2 21 (L) 22 - 32 mmol/L   Glucose, Bld 79 65 - 99 mg/dL   BUN 41 (H) 6 - 20 mg/dL   Creatinine, Ser 1.39 (H) 0.61 - 1.24 mg/dL   Calcium 8.8 (L) 8.9 - 10.3 mg/dL   Total Protein 5.7 (L) 6.5 - 8.1 g/dL   Albumin 3.1 (L) 3.5 - 5.0 g/dL   AST 226 (H) 15 - 41 U/L   ALT 402 (H) 17 - 63 U/L   Alkaline Phosphatase 501 (H) 38 - 126 U/L   Total Bilirubin 4.5 (H) 0.3 - 1.2 mg/dL   GFR calc non Af Amer 50 (L) >60 mL/min   GFR calc Af Amer 58 (L) >60 mL/min   Anion gap 13 5 - 15  CBC with Differential  Result Value Ref Range   WBC 12.7 (H) 4.0 - 10.5 K/uL   RBC 3.29 (L) 4.22 - 5.81 MIL/uL   Hemoglobin 11.4 (L) 13.0 - 17.0 g/dL   HCT 34.1 (L) 39.0 - 52.0 %   MCV 103.6 (H) 78.0 - 100.0 fL   MCH 34.7 (H) 26.0 - 34.0 pg   MCHC 33.4 30.0 - 36.0 g/dL   RDW 14.1 11.5 - 15.5 %   Platelets 150 150 - 400 K/uL   Neutrophils Relative % 92 %   Neutro Abs 11.7 (H) 1.7 - 7.7 K/uL   Lymphocytes Relative 3 %   Lymphs Abs 0.4 (L) 0.7 - 4.0 K/uL   Monocytes Relative 5 %   Monocytes Absolute 0.6 0.1 - 1.0 K/uL   Eosinophils Relative 0 %   Eosinophils Absolute 0.0 0.0 - 0.7 K/uL   Basophils Relative 0 %   Basophils Absolute 0.0 0.0 - 0.1 K/uL  I-Stat CG4 Lactic Acid, ED  (not at [facility])  Result Value Ref Range   Lactic Acid, Venous 4.15 (HH) 0.5 - 2.0 mmol/L   Comment NOTIFIED PHYSICIAN    *Note: Due to a large number of results and/or encounters for the requested time period, some results have not been displayed. A complete set of results can be found in Results Review.    Imaging Review Dg Chest Port 1 View  04/23/2015  CLINICAL DATA:  Fever, weakness and nausea for 1 day. Hypotension today. Lung cancer patient. Insert image EXAM:  PORTABLE CHEST 1 VIEW COMPARISON:  CT chest 01/02/2015.  PA and lateral chest 12/24/2014. FINDINGS: Port-A-Cath is in place, unchanged. The lungs are clear. Mediastinal lymphadenopathy appears improved. Heart size is normal. No pneumothorax or pleural effusion. IMPRESSION: No acute disease.  Mediastinal lymphadenopathy appears improved. Electronically Signed   By: Inge Rise M.D.   On: 04/23/2015 19:39   I have personally reviewed and evaluated these images and lab results as part of my medical decision-making.   EKG Interpretation   Date/Time:  Wednesday April 23 2015 18:37:48 EST Ventricular Rate:  104 PR Interval:  182 QRS Duration: 68 QT Interval:  323 QTC Calculation: 425 R Axis:   13 Text Interpretation:  Sinus tachycardia Anterior infarct, old ST  elevation, consider inferior injury Baseline wander in lead(s) V3  Confirmed by Wealthy Danielski  MD, Ceairra Mccarver [id number]) on 04/23/2015 7:16:48 PM  CRITICAL CARE Performed by: Fredia Sorrow Total critical care time: 30 minutes Critical care time was exclusive of separately billable procedures and treating other patients. Critical care was necessary to treat or prevent imminent or life-threatening deterioration. Critical care was time spent personally by me on the following activities: development of treatment plan with patient and/or surrogate as well as nursing, discussions with consultants, evaluation of patient's response to treatment, examination of patient, obtaining history from patient or surrogate, ordering and performing treatments and interventions, ordering and review of laboratory studies, ordering and review of radiographic studies, pulse oximetry and re-evaluation of patient's condition.   MDM   Final diagnoses:  Sepsis, due to unspecified organism [facility])  Malignant neoplasm of lung, unspecified laterality, unspecified part of lung [facility])    Patient with a history of end-stage lung cancer. Technically a DO NOT  RESUSCITATE. Has been on hospice but they're about ready to remove him from hospice because he's been improving a lot lately. Patient with acute onset this afternoon of high fever. And dropping and blood pressures. Patient felt fine yesterday. Patient normal blood pressure is usually of 04-888 systolic. Patient EMS was hypotensive given a liter of fluid a claimed that his systolic pressure came up to 1 low 100s. However upon arrival here patient's pressures were below 80 systolic. Slight tachycardia. On 2 L of oxygen he satting fine on room air patient was hypoxic at 88%.  Septic protocols were started. Blood cultures obtained broad-spectrum antibiotics ordered. Patient received 30 mL of fluid per kilogram. That's 1.5 L K[place]zy only weighs 114 pounds. At that point patient's blood pressure was still low. Second liter was ordered. Neo-Synephrine was ordered to go into his Port-A-Cath. I was not aware that she could do the norepinephrine through that. Never got started. Critical care team to see patient and switched it over to norepinephrine. Critical care will admit the patient. Despite the fact he had patient is a DO NOT RESUSCITATE just does not want CPR or ventilator but does want pressor a chance and antibiotics and anything short of that.    Fredia Sorrow, MD 04/23/15 2055

## 2015-04-23 NOTE — ED Notes (Signed)
Bed: TM21 Expected date:  Expected time:  Means of arrival:  Comments: EMS- 70yo M, hypotensive

## 2015-04-23 NOTE — ED Notes (Signed)
Per [facility]- Pt resides at home DNR yellow copy present. Pt with fever (102 oral at 3pm before tylenol)., weakness, stomach pain with nausea x 1 day. Hypotension this afternoon. Arrival 60/40 manual. 102/67 with 830m NS. #20 left hand. Pt alert and active with care. Wife gave zofran and reglan today. Lung Ca. Pt is no longer on any chemo

## 2015-04-24 ENCOUNTER — Encounter (HOSPITAL_COMMUNITY): Payer: Self-pay | Admitting: *Deleted

## 2015-04-24 DIAGNOSIS — A419 Sepsis, unspecified organism: Secondary | ICD-10-CM

## 2015-04-24 DIAGNOSIS — R6521 Severe sepsis with septic shock: Secondary | ICD-10-CM

## 2015-04-24 LAB — CBC
HEMATOCRIT: 30.1 % — AB (ref 39.0–52.0)
Hemoglobin: 10.2 g/dL — ABNORMAL LOW (ref 13.0–17.0)
MCH: 34.9 pg — ABNORMAL HIGH (ref 26.0–34.0)
MCHC: 33.9 g/dL (ref 30.0–36.0)
MCV: 103.1 fL — AB (ref 78.0–100.0)
PLATELETS: 137 10*3/uL — AB (ref 150–400)
RBC: 2.92 MIL/uL — ABNORMAL LOW (ref 4.22–5.81)
RDW: 14.4 % (ref 11.5–15.5)
WBC: 16.5 10*3/uL — ABNORMAL HIGH (ref 4.0–10.5)

## 2015-04-24 LAB — TROPONIN I
TROPONIN I: 0.14 ng/mL — AB (ref <0.031)
Troponin I: 0.1 ng/mL — ABNORMAL HIGH (ref <0.031)
Troponin I: 0.21 ng/mL — ABNORMAL HIGH (ref <0.031)
Troponin I: 0.2 ng/mL — ABNORMAL HIGH (ref <0.031)

## 2015-04-24 LAB — HEPATIC FUNCTION PANEL
ALK PHOS: 354 U/L — AB (ref 38–126)
ALT: 296 U/L — AB (ref 17–63)
AST: 142 U/L — ABNORMAL HIGH (ref 15–41)
Albumin: 2.5 g/dL — ABNORMAL LOW (ref 3.5–5.0)
BILIRUBIN DIRECT: 3.4 mg/dL — AB (ref 0.1–0.5)
BILIRUBIN INDIRECT: 1.1 mg/dL — AB (ref 0.3–0.9)
BILIRUBIN TOTAL: 4.5 mg/dL — AB (ref 0.3–1.2)
Total Protein: 5.1 g/dL — ABNORMAL LOW (ref 6.5–8.1)

## 2015-04-24 LAB — BASIC METABOLIC PANEL
Anion gap: 7 (ref 5–15)
BUN: 31 mg/dL — ABNORMAL HIGH (ref 6–20)
CALCIUM: 8.2 mg/dL — AB (ref 8.9–10.3)
CO2: 21 mmol/L — AB (ref 22–32)
CREATININE: 1.17 mg/dL (ref 0.61–1.24)
Chloride: 111 mmol/L (ref 101–111)
GFR calc Af Amer: >60 mL/min (ref >60)
GLUCOSE: 94 mg/dL (ref 65–99)
Potassium: 3.8 mmol/L (ref 3.5–5.1)
Sodium: 139 mmol/L (ref 135–145)

## 2015-04-24 LAB — MRSA PCR SCREENING: MRSA by PCR: NEGATIVE

## 2015-04-24 LAB — MAGNESIUM: Magnesium: 1.4 mg/dL — ABNORMAL LOW (ref 1.7–2.4)

## 2015-04-24 LAB — LACTIC ACID, PLASMA: Lactic Acid, Venous: 1.2 mmol/L (ref 0.5–2.0)

## 2015-04-24 LAB — PROCALCITONIN: Procalcitonin: 27.67 ng/mL

## 2015-04-24 LAB — CORTISOL: Cortisol, Plasma: 28.1 ug/dL

## 2015-04-24 LAB — PHOSPHORUS: Phosphorus: 3.1 mg/dL (ref 2.5–4.6)

## 2015-04-24 MED ORDER — VANCOMYCIN HCL 500 MG IV SOLR
500.0000 mg | Freq: Two times a day (BID) | INTRAVENOUS | Status: DC
  Administered 2015-04-24 – 2015-04-25 (×2): 500 mg via INTRAVENOUS
  Filled 2015-04-24 (×3): qty 500

## 2015-04-24 MED ORDER — CHLORHEXIDINE GLUCONATE 0.12 % MT SOLN
15.0000 mL | Freq: Two times a day (BID) | OROMUCOSAL | Status: DC
  Administered 2015-04-25 – 2015-04-28 (×2): 15 mL via OROMUCOSAL
  Filled 2015-04-24 (×4): qty 15

## 2015-04-24 MED ORDER — ACETAMINOPHEN 325 MG PO TABS: 650.0000 mg | ORAL_TABLET | ORAL | Status: DC | PRN

## 2015-04-24 MED ORDER — MORPHINE SULFATE (PF) 2 MG/ML IV SOLN
2.0000 mg | INTRAVENOUS | Status: DC | PRN
  Filled 2015-04-24: qty 1

## 2015-04-24 MED ORDER — CETYLPYRIDINIUM CHLORIDE 0.05 % MT LIQD
7.0000 mL | Freq: Two times a day (BID) | OROMUCOSAL | Status: DC
  Administered 2015-04-25 – 2015-04-28 (×6): 7 mL via OROMUCOSAL

## 2015-04-24 NOTE — Progress Notes (Signed)
Pharmacy Antibiotic Follow-up Note  [name] is a 70 y.o. year-old male admitted on 04/23/2015.  The patient is currently on day 2 of ? for suspected sepsis, origin unknown.  Assessment/Plan: AF, WBCs elev and rising, SCr improved, CrCl 47CG. SCr has improved. Change Vanc to '500mg'$  q12h. Cont same Aztreonam. F/u GNR cx for abx de-escalation.  Temp (24hrs), Avg:99.1 F (37.3 C), Min:97.8 F (36.6 C), Max:99.9 F (37.7 C)   Recent Labs Lab 04/23/15 1903 04/24/15 0415  WBC 12.7* 16.5*    Recent Labs Lab 04/23/15 1903 04/24/15 0415  CREATININE 1.39* 1.17   Estimated Creatinine Clearance: 46.6 mL/min (by C-G formula based on Cr of 1.17).    Allergies  Allergen Reactions  . Augmentin [Amoxicillin-Pot Clavulanate] Diarrhea  . Carboplatin     Rash and itching after carbo test dose  . Levaquin [Levofloxacin] Other (See Comments)    Knee pain  . Percocet [Oxycodone-Acetaminophen] Nausea And Vomiting  . Scopolamine     hallucinations  . Zolpidem Tartrate Diarrhea    Antimicrobials this admission: 1/25 >> Vanc >> 1/25 >> Aztreonam >> 1/25 >> Azithro >>  Levels/dose changes this admission:  Microbiology results: 1/25 MRSA: neg 1/25 bcx x2: GNR in 2/2 1/25 ucx: ngtd  Thank you for allowing pharmacy to be a part of this patient's care.  Romeo Rabon, PharmD, pager [phone]. 04/24/2015,11:50 AM.

## 2015-04-24 NOTE — Progress Notes (Signed)
PULMONARY / CRITICAL CARE MEDICINE   Name: [name] MRN: [id number] DOB: Oct 08, 1945    ADMISSION DATE:  04/23/2015 CONSULTATION DATE:  04/23/15  REFERRING MD:  EDP  CHIEF COMPLAINT:  Fever, weakness, abd pain, nausea.  HISTORY OF PRESENT ILLNESS:   [name] is a 70 y.o. male with PMH as outlined below including metastatic NSCLC initially diagnosed Sept 2010 (adeno - sees Dr. [name]).   He is s/p chemo and XRT and was on maintenance chemo and immunotherapy up until what appears to be Sept 2016.  Per oncology notes (office visit from 12/13/14), these were discontinued due to disease progression.  He was then referred to hospice / palliative care.  He has apparently been on hospice since but per family, had been doing well enough that hospice had considered discharging him. He has PEG in place and gets 100% of nutrition this way, as family thinks his swallowing was damaged by endoscopy. Intake has been decreased last few days due to his nausea. PEG has been leaking somewhat recently.  On 04/23/15, he presented to [facility] ED via EMS for fever (Tmax 102), weakness, abd pain x 1 week, and nausea.  On EMS arrival he was also hypotensive (60/40 manual). Baseline BP is 90s/60s.  In ED, he remained hypotensive with SBP in mid 70's.   His workup also revealed lactate 4.15, WBC 12.7, AKI with SCr 1.39, transaminitis.  UA pending.  His is full DNR; however, family requested vasopressor initiation after EDP discussed options with them; therefore, PCCM was called for admission.  PAST MEDICAL HISTORY :  He  has a past medical history of Hypertension; Arthritis (RIGHT SHOULDER); Immature cataract (BILATERAL); Non-small cell lung cancer ([place]) (DX SEPT 2010  W/ CHEMORADIATION AT THAT TIME -- NOW  W/ METS--  CURRENTLY ON MAINTENANCE CHEMO TX  EVERY 30 DAYS); Acute meniscal tear of knee (RIGHT KNEE); BPH (benign prostatic hypertrophy); Dizziness and giddiness (04/23/2014); Tremor (04/23/2014); HOH (hard of  hearing); Radiation (01/08/09-01/21/09); Radiation (08/19/14-09/04/14); Pneumonia; and Abnormality of gait (03/12/2015).  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  Allergies  Allergen Reactions  . Augmentin [Amoxicillin-Pot Clavulanate] Diarrhea  . Carboplatin     Rash and itching after carbo test dose  . Levaquin [Levofloxacin] Other (See Comments)    Knee pain  . Percocet [Oxycodone-Acetaminophen] Nausea And Vomiting  . Scopolamine     hallucinations  . Zolpidem Tartrate Diarrhea    No current facility-administered medications on file prior to encounter.   Current Outpatient Prescriptions on File Prior to Encounter  Medication Sig  . b complex vitamins tablet Take 1 tablet by mouth daily.  . influenza vac split quadrivalent PF (FLUARIX) 0.5 ML injection Inject 0.5 mLs into the muscle once.  . metoCLOPramide (REGLAN) 10 MG tablet Take 1 tablet (10 mg total) by mouth every 8 (eight) hours as needed for nausea. (Patient taking differently: Take 10 mg by mouth 2 (two) times daily. )  . midodrine (PROAMATINE) 5 MG tablet Take 5 mg by mouth 3 (three) times daily with meals.  . Multiple Vitamin (MULTIVITAMIN)  tablet Take 1 tablet by mouth every morning.   . Nutritional Supplements (FEEDING SUPPLEMENT, OSMOLITE 1.5 CAL,) LIQD Begin Osmolite 1.5 at 20 ml/hr continuous infusion via PEG for 12 hours overnight from 8 pm to  8 am. Increase 10 cc daily to initial goal of 95 cc/hr for 15 hours daily as tolerated.  Flush feeding tube with 240 cc free water 6 times daily. This provides 100% estimated needs. Please send feeding pump and instruct patient on how to use pump. Send TF supplies. Do not send formula.  Marland Kitchen omeprazole (PRILOSEC) 40 MG capsule Take 1 capsule (40 mg total) by mouth every evening.  . ondansetron (ZOFRAN) 8 MG tablet Take 1 tablet (8 mg total) by mouth every 8 (eight) hours as needed for nausea or vomiting.  . prochlorperazine (COMPAZINE) 10 MG tablet Take 1 tablet (10 mg total) by mouth every 6 (six) hours as needed for nausea or vomiting.  . triamcinolone (NASACORT ALLERGY 24HR) 55 MCG/ACT AERO nasal inhaler Place 1 spray into the nose daily as needed (congestion.).  Marland Kitchen topiramate (TOPAMAX) 25 MG tablet One tablet at night for 1 week, then take 1 tablet twice a day (Patient not taking: Reported on 04/23/2015)    FAMILY HISTORY:  His indicated that his mother is deceased. He indicated that his father is deceased. He indicated that his sister is alive. He indicated that his brother is deceased. He indicated that his maternal grandfather is deceased.   SOCIAL HISTORY: He  reports that he quit smoking about 34 years ago. His smoking use included Cigarettes. He has a 40 pack-year smoking history. He has never used smokeless tobacco. He reports that he does not drink alcohol or use illicit drugs.  REVIEW OF SYSTEMS:   Bolds are positive  Constitutional: weight loss, gain, night sweats, Fevers, chills, fatigue .  HEENT: headaches, Sore throat, sneezing, nasal congestion, post nasal drip, Difficulty swallowing, Tooth/dental problems, visual complaints visual changes, ear ache CV:  chest pain, radiates: ,Orthopnea, PND, swelling in lower extremities, dizziness, palpitations, syncope.  GI  heartburn, indigestion, abdominal pain, nausea, vomiting, diarrhea, change in bowel habits, loss of appetite, bloody stools.   Resp: cough, productive: , hemoptysis, dyspnea, chest pain, pleuritic.  Skin: rash or itching or icterus GU: dysuria, change in color of urine, urgency or frequency. flank pain, hematuria  MS: joint pain or swelling. decreased range of motion  Psych: change in mood or affect. depression or anxiety.  Neuro: difficulty with speech, weakness, numbness, ataxia   SUBJECTIVE:  Doing well overnight. He is now off pressors. AKI, elevated lactate, LFTs are improving  VITAL SIGNS: BP 125/76 mmHg  Pulse 87  Temp(Src) 98.4 F (36.9 C) (Axillary)  Resp 17  Ht '5\' 8"'$  (1.727 m)  Wt 123 lb 10.9 oz (56.1 kg)  BMI 18.81 kg/m2  SpO2 100%  HEMODYNAMICS:    VENTILATOR SETTINGS:    INTAKE / OUTPUT: I/O last 3 completed shifts: In: 850 [I.V.:800; IV Piggyback:50] Out: 1250 [Urine:1250]   PHYSICAL EXAMINATION: General: Chronically ill-looking male, no distress Neuro: Alert, oriented, no focal deficits HEENT: No JVD, thyromegaly Cardiovascular: RRR, no MRG Lungs: Clear bilateral breath sounds Abdomen: Soft, nontender, nondistended Musculoskeletal: No acute deformity or ROM limitation Skin: Grossly intact  LABS:  BMET  Recent Labs Lab 04/23/15 1903 04/24/15 0415  NA 139 139  K 3.6 3.8  CL 105 111  CO2 21* 21*  BUN 41* 31*  CREATININE 1.39* 1.17  GLUCOSE 79 94    Electrolytes  Recent  Labs Lab 04/23/15 1903 04/24/15 0415  CALCIUM 8.8* 8.2*  MG  --  1.4*  PHOS  --  3.1    CBC  Recent Labs Lab 04/23/15 1903 04/24/15 0415  WBC 12.7* 16.5*  HGB 11.4* 10.2*  HCT 34.1* 30.1*  PLT 150 137*    Coag's No results for input(s): APTT, INR in the last 168 hours.  Sepsis Markers  Recent Labs Lab 04/23/15 1912 04/23/15 2245 04/24/15 0203 04/24/15 0415  LATICACIDVEN 4.15* 1.9 1.2  --   PROCALCITON  --  23.94  --  27.67    ABG No results for input(s): PHART, PCO2ART, PO2ART in the last 168 hours.  Liver Enzymes  Recent Labs Lab 04/23/15 1903 04/24/15 0415   AST 226* 142*  ALT 402* 296*  ALKPHOS 501* 354*  BILITOT 4.5* 4.5*  ALBUMIN 3.1* 2.5*    Cardiac Enzymes  Recent Labs Lab 04/23/15 2245 04/24/15 0203  TROPONINI 0.03 0.10*    Glucose No results for input(s): GLUCAP in the last 168 hours.  Imaging Ct Abdomen Pelvis W Contrast  04/23/2015  CLINICAL DATA:  Fever, weakness, stomach pain, and nausea for 1 day. Hypotension this afternoon. Non-small-cell lung cancer diagnosed September 2010. No active treatment. EXAM: CT ABDOMEN AND PELVIS WITH CONTRAST TECHNIQUE: Multidetector CT imaging of the abdomen and pelvis was performed using the standard protocol following bolus administration of intravenous contrast. CONTRAST:  30m OMNIPAQUE IOHEXOL 300 MG/ML SOLN, 1046mOMNIPAQUE IOHEXOL 300 MG/ML SOLN COMPARISON:  09/21/2014 FINDINGS: Small bilateral pleural effusions with basilar atelectasis. Cystic changes in the lung bases may represent honeycomb change or emphysema. Coronary artery calcifications. Biliary stent in place. Mild bile duct dilatation with pneumobilia. Enlarged lymph node adjacent to the pancreatic head seen previously has significantly decreased in size. No significant residual tumor identified. No pancreatic ductal dilatation and pancreatic parenchyma appears normal. The gallbladder, spleen, adrenal glands, are unremarkable. There is an enlarging posterior paracaval lymph node which extends into the right psoas muscle. This lymph node was present previously but is enlarging common now measuring 3.6 cm in diameter. Additional scattered retroperitoneal lymph nodes are not significantly enlarged. Cyst in the left kidney. No hydronephrosis in either kidney. Percutaneous gastrostomy tube. Stomach and small bowel are decompressed. Contrast material flows through to the rectum suggesting no evidence of bowel or colonic obstruction. No free air in the abdomen. There is some infiltration or edema in the right upper quadrant fat. Diffuse  calcification and atherosclerotic change in the abdominal aorta without aneurysm. Pelvis: Bladder is decompressed with a Foley catheter. Gas in the bladder consistent with catheterization. Diffuse bladder wall thickening likely indicate infection. No free or loculated pelvic fluid collections. No pelvic mass or lymphadenopathy. Prostate gland is mildly enlarged, measuring 4.9 cm transverse diameter. Vascular calcifications in the iliac and femoral arteries. No destructive bone lesions. Degenerative changes in the spine and hips. IMPRESSION: Small bilateral pleural effusions with basilar atelectasis. Enlarging posterior retroperitoneal paracaval lymph node. Previous enlarged lymph node near the head of the pancreas has decreased since prior study. Biliary stent is in place. Prostate gland is enlarged. Bladder wall is thickened, suggesting cystitis. Electronically Signed   By: WiLucienne Capers.D.   On: 04/23/2015 23:44   Dg Chest Port 1 View  04/23/2015  CLINICAL DATA:  Fever, weakness and nausea for 1 day. Hypotension today. Lung cancer patient. Insert image EXAM: PORTABLE CHEST 1 VIEW COMPARISON:  CT chest 01/02/2015.  PA and lateral chest 12/24/2014. FINDINGS: Port-A-Cath is in place, unchanged. The  lungs are clear. Mediastinal lymphadenopathy appears improved. Heart size is normal. No pneumothorax or pleural effusion. IMPRESSION: No acute disease.  Mediastinal lymphadenopathy appears improved. Electronically Signed   By: Inge Rise M.D.   On: 04/23/2015 19:39     STUDIES:  CXR 01/25 > no acute process. Mediastinal LAN appears improved. CT abd 1/25 >>> Small B/L effusions, Abd LNs, ? cystitis  CULTURES: Blood 01/25 >  Urine 01/25 > Sputum 01/25 >  ANTIBIOTICS: Aztreonam 01/25 > Azithro 1/25 > Vancomycin 01/25 >  SIGNIFICANT EVENTS: 01/25 > admitted with sepsis / probable septic shock of unclear etiology.  LINES/TUBES: Port >  ASSESSMENT / PLAN:  CARDIOVASCULAR A:  Sepsis of  unclear etiology with concern for developing shock despite 3L IVF Hypotensive at baseline on midodrine (90s/60s) DNR status. P:  Tele Weaned off pressors Cortisol adequate Trend lactate, troponin. Continue home midodrine   INFECTIOUS A:   Sepsis of unclear etiology with concern for shock P:   Abx as above (vanc /aztreonam/azithro). Follow cultures and UA. PCT algorithm to limit abx exposure.   PULMONARY A: Metastatic NSCLC (adeno) - followed by Dr. [name].  Was previously on chemo and XRT; however, this was stopped Sept 2016 due to disease progression.  He was then referred to hospice. DNI status. P:   Consider pallaitive care consult in AM Supplemental O2 as needed to keep SpO2 > 92%  RENAL A:   AKI. Pseudohypocalcemia - corrects to 9.52.  P:   IVF hydration Follow BMP Follow urine output and Cr  GASTROINTESTINAL A:   Abd pain with nausea Transaminitis Nutrition P:   Start TF in AM PRN zofran  HEMATOLOGIC / ONCOLOGIC A:   Mild macrocytic anemia. VTE Prophylaxis. Metastatic NSCLC ([place]) - followed by Dr. [name].  Was previously on chemo and XRT; however, this was stopped Sept 2016 due to disease progression.  He was then referred to hospice. P:  Transfuse for Hgb < 7. SCD's / Heparin. CBC in AM.  ENDOCRINE A:   No acute issues. P:   Assess TSH, cortisol.  NEUROLOGIC A:   No acute issues  P:   Monitor  Family updated: Wife and patient updated 1/16 Interdisciplinary Family Meeting v Palliative Care Meeting:  Due by: 2/1.  Critical care time - 45 mins  Marshell Garfinkel MD [facility] Pager [phone] If no answer or after 3pm call: [phone] 04/24/2015, 10:37 AM

## 2015-04-24 NOTE — Progress Notes (Signed)
Initial Nutrition Assessment  DOCUMENTATION CODES:   Severe malnutrition in context of chronic illness  INTERVENTION:  -When medically appropriate, begin Osmolite 1.5 - 3 cans Q24H, TwoCalHN 1 can Q24h.  This is what pt did at home and requests be done here. Provides 1540 calories, 65 grams protein, 709cc free H2O  -RD to continue to monitor for diet advancement/appropriateness for nutrition support.  NUTRITION DIAGNOSIS:   Malnutrition related to chronic illness as evidenced by severe depletion of muscle mass, severe depletion of body fat.  GOAL:   Patient will meet greater than or equal to 90% of their needs  MONITOR:   Labs, I & O's, Skin, Diet advancement, Supplement acceptance  REASON FOR ASSESSMENT:   Malnutrition Screening Tool    ASSESSMENT:   [name] is a 70 y.o. male with PMH as outlined below including metastatic NSCLC initially diagnosed Sept 2010 (adeno - sees Dr. [name]). He is s/p chemo and XRT and was on maintenance chemo and immunotherapy up until what appears to be Sept 2016  Pt was on hospice/palliative care at home, but has been improving lately, was being considered for discharge until this acute incident. Pt does not take anything PO, 100% nutrition through PEG.   Per patient, he does 3 cans of Osmolite 1.5 and 1 Can TwoCal HN at home. Will follow same regimen here. Pt did not want to switch to more cans of Osmolite at this time.  Per RN, pt has gram negative rod bacterial infection.  Nutrition-Focused physical exam completed. Findings are no fat depletion, no muscle depletion, and no edema.   Medications reviewed. Labs:  Mg 1.4, BUN 31, CBGs: 123-138.    Diet Order:  Diet NPO time specified  Skin:  Reviewed, no issues  Last BM:  1/26  Height:   Ht Readings from Last 1 Encounters:  04/23/15 '5\' 8"'$  (1.727 m)    Weight:   Wt Readings from Last 1 Encounters:  04/24/15 123 lb 10.9 oz (56.1 kg)    Ideal Body Weight:  70  kg  BMI:  Body mass index is 18.81 kg/(m^2).  Estimated Nutritional Needs:   Kcal:  0076-2263  Protein:  70-85 grams  Fluid:  >/= 2.5L  EDUCATION NEEDS:   No education needs identified at this time  Satira Anis. Saxton Chain, MS, RD LDN After Hours/Weekend Pager [phone]

## 2015-04-24 NOTE — Progress Notes (Signed)
Patient asleep, respiratory rate decreased from RR of 20 to RR of <8. Patient oxygen saturation decreasing to 80% and quickly returning back to 98-100% with no intervention by RN. RN placed patient on 2L [place] while patient slept. Following intervention patient oxygen saturation remained 98-100% while patient slept. Prior to patient sleeping patient was on RA with an oxygen saturation of 98-100%. Will continue to monitor.

## 2015-04-24 NOTE — Care Management Note (Signed)
Case Management Note  Patient Details  Name: [name] MRN: [id number] Date of Birth: 1945-06-16  Subjective/Objective:       Sepsis, temp, elevated wbc, hypotensive             Action/Plan:Date: April 24, 2015 Chart reviewed for concurrent status and case management needs. Will continue to follow patient for changes and needs: Velva Harman, BSN, CCM, RN,   [phone]   Expected Discharge Date:                  Expected Discharge Plan:  Home/Self Care  In-House Referral:  NA  Discharge planning Services  CM Consult  Post Acute Care Choice:  NA Choice offered to:  NA  DME Arranged:    DME Agency:     HH Arranged:    [place] Agency:     Status of Service:  Completed, signed off  Medicare Important Message Given:    Date Medicare IM Given:    Medicare IM give by:    Date Additional Medicare IM Given:    Additional Medicare Important Message give by:     If discussed at [place] of Stay Meetings, dates discussed:    Additional Comments:  Leeroy Cha, RN 04/24/2015, 10:32 AM

## 2015-04-24 NOTE — Progress Notes (Signed)
CRITICAL VALUE ALERT  Critical value received:  Anaerobic blood cultures positive for gram - rods  Date of notification:  04/24/15  Time of notification:  6151  Critical value read back:Yes.    Nurse who received alert:  Avon Gully, RN  MD notified (1st page):  Dr. Vaughan Browner  Time of first page:  1105  Responding MD:  Dr. Vaughan Browner  Time MD responded:  [phone]

## 2015-04-25 ENCOUNTER — Encounter: Payer: Medicare Other | Admitting: Physical Therapy

## 2015-04-25 ENCOUNTER — Inpatient Hospital Stay (HOSPITAL_COMMUNITY): Payer: Medicare Other

## 2015-04-25 DIAGNOSIS — R509 Fever, unspecified: Secondary | ICD-10-CM

## 2015-04-25 DIAGNOSIS — R7881 Bacteremia: Secondary | ICD-10-CM

## 2015-04-25 DIAGNOSIS — C3492 Malignant neoplasm of unspecified part of left bronchus or lung: Secondary | ICD-10-CM

## 2015-04-25 DIAGNOSIS — R42 Dizziness and giddiness: Secondary | ICD-10-CM

## 2015-04-25 DIAGNOSIS — E43 Unspecified severe protein-calorie malnutrition: Secondary | ICD-10-CM

## 2015-04-25 DIAGNOSIS — R740 Nonspecific elevation of levels of transaminase and lactic acid dehydrogenase [LDH]: Secondary | ICD-10-CM

## 2015-04-25 LAB — COMPREHENSIVE METABOLIC PANEL
ALBUMIN: 2.6 g/dL — AB (ref 3.5–5.0)
ALK PHOS: 343 U/L — AB (ref 38–126)
ALT: 197 U/L — AB (ref 17–63)
ANION GAP: 11 (ref 5–15)
AST: 63 U/L — ABNORMAL HIGH (ref 15–41)
BILIRUBIN TOTAL: 5.3 mg/dL — AB (ref 0.3–1.2)
BUN: 26 mg/dL — ABNORMAL HIGH (ref 6–20)
CALCIUM: 9 mg/dL (ref 8.9–10.3)
CO2: 19 mmol/L — AB (ref 22–32)
CREATININE: 0.95 mg/dL (ref 0.61–1.24)
Chloride: 115 mmol/L — ABNORMAL HIGH (ref 101–111)
GFR calc non Af Amer: >60 mL/min (ref >60)
GLUCOSE: 90 mg/dL (ref 65–99)
Potassium: 3.2 mmol/L — ABNORMAL LOW (ref 3.5–5.1)
SODIUM: 145 mmol/L (ref 135–145)
TOTAL PROTEIN: 5.4 g/dL — AB (ref 6.5–8.1)

## 2015-04-25 LAB — URINE CULTURE: CULTURE: NO GROWTH

## 2015-04-25 LAB — TROPONIN I: Troponin I: 0.13 ng/mL — ABNORMAL HIGH (ref <0.031)

## 2015-04-25 LAB — PROCALCITONIN: Procalcitonin: 11.9 ng/mL

## 2015-04-25 MED ORDER — OSMOLITE 1.5 CAL PO LIQD: 1000.0000 mL | ORAL | Status: DC

## 2015-04-25 MED ORDER — OSMOLITE 1.5 CAL PO LIQD
1000.0000 mL | ORAL | Status: DC
  Administered 2015-04-25 – 2015-04-27 (×2): 1000 mL via JEJUNOSTOMY
  Filled 2015-04-25 (×3): qty 1000

## 2015-04-25 MED ORDER — POTASSIUM CHLORIDE 20 MEQ/15ML (10%) PO SOLN
40.0000 meq | Freq: Once | ORAL | Status: AC
  Administered 2015-04-25: 40 meq via ORAL
  Filled 2015-04-25: qty 30

## 2015-04-25 NOTE — Progress Notes (Signed)
[facility] PROGRESS NOTE  [name] XBJ:[id number] DOB: [birth year] DOA: 04/23/2015 PCP: [name], MD Brief narrative 69 year old male with history of metastatic non-small cell lung cancer (adenocarcinoma) diagnosed in September 2010 status post chemotherapy and radiation and was on maintenance chemoimmunotherapy on September 2016 and was referred to home hospice given disease progression. However patient has been doing well on hospice. He has a PEG tube placed and receives full nutrition fluid. 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 On 1/25 he was brought to the ED by EMS for fever 102F, weakness, abdominal pain for one week associated with nausea. When EMS evaluated the patient he was hypotensive to 60/40 mmHg. In the ED remained hypotensive with systolic blood pressure in the mid 70s, workup showed a wbc of 12.7 K, lactate of 4.15, acute kidney injury with creatinine of 1.39 and transaminitis. Patient admitted to ICU under critical care service for septic shock and started on Levophed as family requested for pressors.  Assessment/Plan: Septic shock No clear etiology. Blood cultures on admission 2 growing gram negative rods. UA urine culture unremarkable. Was on empiric vancomycin ,  Aztreonam.. Discontinued vancomycin. follow sensitivity. -Patient informed of having indirect laryngoscopy on 1/23. Has poor dentition. 2-D echo ordered to rule out any vegetations. -Sepsis has resolved. Off Levophed drip for 24 hours. Blood pressure normal. Discontinued IV fluids, narrow abx, d/c foley, patient stable to be transferred to telemetry. Ordered PT evaluation. Resumed tube feeds.  Metastatic non-small cell lung cancer (adenocarcinoma) Followed by Dr. [name] and had received chemotherapy and radiation which was stopped in September 2016 due to disease progression and was referred to home hospice. Patient currently being followed by  hospice. reports that he has done quite well and is interested in following up with Dr. [name] again.  Acute kidney injury Prerenal secondary to dehydration and septic shock. Improved with hydration.  Transaminitis Possibly due to shock liver versus biliary obstruction from metastatic cancer. Slowly improving. Monitor LFTs today.  Severe protein calorie malnutrition Resume tube feeds.  Hypomagnesemia Replenish  vertigo Getting outpatient vestibular rehabilitation. Will need to note to go back to rehabilitation upon discharge.  Diet: Resume tube feeds to goal  DVT prophylaxis: Subcutaneous heparin   Code Status: DO NOT RESUSCITATE Family Communication: None at bedside Disposition Plan: For to telemetry. PT evaluation. Home once final blood cultures obtained. Possibly in the next 24-48 hours   Consultants:  PC CM  Procedures:  2-D echo  Antibiotics:  Vancomycin and aztreonam 1/25  HPI/Subjective: Seen and examined. Denies any symptoms. Blood pressure remains stable on gentle IV hydration.  Objective: Filed Vitals:   04/25/15 1100 04/25/15 1200  BP:  156/87  Pulse: 84 83  Temp:    Resp: 20 17    Intake/Output Summary (Last 24 hours) at 04/25/15 1237 Last data filed at 04/25/15 0500  Gross per 24 hour  Intake 1787.5 ml  Output   1800 ml  Net  -12.5 ml   Filed Weights   04/23/15 2211 04/24/15 0500 04/25/15 0500  Weight: 56.8 kg (125 lb 3.5 oz) 56.1 kg (123 lb 10.9 oz) 56.2 kg (123 lb 14.4 oz)    Exam:   General:  Elderly male lying in bed appears fatigued  HEENT: Temporal wasting, pallor present, moist mucosa  Cardiovascular: Normal S1 and S2, no murmurs  Respiratory: Clear bilaterally, no added sounds, Port-A-Cath+  Abdomen: Soft, nondistended, PEG tube in place, bowel sounds present, foley +  Musculoskeletal: Warm, no  edema  CNS: Alert and Oriented   Data Reviewed: Basic Metabolic Panel:  Recent Labs Lab 04/23/15 1903 04/24/15 0415   NA 139 139  K 3.6 3.8  CL 105 111  CO2 21* 21*  GLUCOSE 79 94  BUN 41* 31*  CREATININE 1.39* 1.17  CALCIUM 8.8* 8.2*  MG  --  1.4*  PHOS  --  3.1   Liver Function Tests:  Recent Labs Lab 04/23/15 1903 04/24/15 0415  AST 226* 142*  ALT 402* 296*  ALKPHOS 501* 354*  BILITOT 4.5* 4.5*  PROT 5.7* 5.1*  ALBUMIN 3.1* 2.5*   No results for input(s): LIPASE, AMYLASE in the last 168 hours. No results for input(s): AMMONIA in the last 168 hours. CBC:  Recent Labs Lab 04/23/15 1903 04/24/15 0415  WBC 12.7* 16.5*  NEUTROABS 11.7*  --   HGB 11.4* 10.2*  HCT 34.1* 30.1*  MCV 103.6* 103.1*  PLT 150 137*   Cardiac Enzymes:  Recent Labs Lab 04/24/15 0203 04/24/15 0835 04/24/15 1439 04/24/15 2112 04/25/15 0225  TROPONINI 0.10* 0.21* 0.20* 0.14* 0.13*   BNP (last 3 results) No results for input(s): BNP in the last 8760 hours.  ProBNP (last 3 results) No results for input(s): PROBNP in the last 8760 hours.  CBG: No results for input(s): GLUCAP in the last 168 hours.  Recent Results (from the past 240 hour(s))  Culture, blood (routine x 2)     Status: None (Preliminary result)   Collection Time: 04/23/15  7:03 PM  Result Value Ref Range Status   Specimen Description BLOOD LEFT HAND  Final   Special Requests BOTTLES DRAWN AEROBIC AND ANAEROBIC 5ML  Final   Culture  Setup Time   Final    GRAM NEGATIVE RODS IN BOTH AEROBIC AND ANAEROBIC BOTTLES CRITICAL RESULT CALLED TO, READ BACK BY AND VERIFIED WITH: A. Koontz 11:00 04/24/15 ([email or web address])    Culture   Final    GRAM NEGATIVE RODS CULTURE REINCUBATED FOR BETTER GROWTH Performed at [facility]    Report Status PENDING  Incomplete  Culture, blood (routine x 2)     Status: None (Preliminary result)   Collection Time: 04/23/15  8:55 PM  Result Value Ref Range Status   Specimen Description BLOOD RIGHT CHEST PORTA CATH  Final   Special Requests BOTTLES DRAWN AEROBIC AND ANAEROBIC 5CC  Final   Culture  Setup  Time   Final    GRAM NEGATIVE RODS IN BOTH AEROBIC AND ANAEROBIC BOTTLES CRITICAL RESULT CALLED TO, READ BACK BY AND VERIFIED WITH: Velta Addison RN 11:00 04/24/15 ([email or web address])    Culture   Final    GRAM NEGATIVE RODS CULTURE REINCUBATED FOR BETTER GROWTH Performed at [facility] [place]    Report Status PENDING  Incomplete  Urine culture     Status: None   Collection Time: 04/23/15  9:00 PM  Result Value Ref Range Status   Specimen Description URINE, CATHETERIZED  Final   Special Requests NONE  Final   Culture   Final    NO GROWTH 2 DAYS Performed at [facility]    Report Status 04/25/2015 FINAL  Final  MRSA PCR Screening     Status: None   Collection Time: 04/23/15 10:35 PM  Result Value Ref Range Status   MRSA by PCR NEGATIVE NEGATIVE Final    Comment:        The GeneXpert MRSA Assay (FDA approved for NASAL specimens only), is one component of a comprehensive MRSA colonization surveillance program.  It is not intended to diagnose MRSA infection nor to guide or monitor treatment for MRSA infections.      Studies: Ct Abdomen Pelvis W Contrast  04/23/2015  CLINICAL DATA:  Fever, weakness, stomach pain, and nausea for 1 day. Hypotension this afternoon. Non-small-cell lung cancer diagnosed September 2010. No active treatment. EXAM: CT ABDOMEN AND PELVIS WITH CONTRAST TECHNIQUE: Multidetector CT imaging of the abdomen and pelvis was performed using the standard protocol following bolus administration of intravenous contrast. CONTRAST:  107m OMNIPAQUE IOHEXOL 300 MG/ML SOLN, 1031mOMNIPAQUE IOHEXOL 300 MG/ML SOLN COMPARISON:  09/21/2014 FINDINGS: Small bilateral pleural effusions with basilar atelectasis. Cystic changes in the lung bases may represent honeycomb change or emphysema. Coronary artery calcifications. Biliary stent in place. Mild bile duct dilatation with pneumobilia. Enlarged lymph node adjacent to the pancreatic head seen previously has significantly decreased in  size. No significant residual tumor identified. No pancreatic ductal dilatation and pancreatic parenchyma appears normal. The gallbladder, spleen, adrenal glands, are unremarkable. There is an enlarging posterior paracaval lymph node which extends into the right psoas muscle. This lymph node was present previously but is enlarging common now measuring 3.6 cm in diameter. Additional scattered retroperitoneal lymph nodes are not significantly enlarged. Cyst in the left kidney. No hydronephrosis in either kidney. Percutaneous gastrostomy tube. Stomach and small bowel are decompressed. Contrast material flows through to the rectum suggesting no evidence of bowel or colonic obstruction. No free air in the abdomen. There is some infiltration or edema in the right upper quadrant fat. Diffuse calcification and atherosclerotic change in the abdominal aorta without aneurysm. Pelvis: Bladder is decompressed with a Foley catheter. Gas in the bladder consistent with catheterization. Diffuse bladder wall thickening likely indicate infection. No free or loculated pelvic fluid collections. No pelvic mass or lymphadenopathy. Prostate gland is mildly enlarged, measuring 4.9 cm transverse diameter. Vascular calcifications in the iliac and femoral arteries. No destructive bone lesions. Degenerative changes in the spine and hips. IMPRESSION: Small bilateral pleural effusions with basilar atelectasis. Enlarging posterior retroperitoneal paracaval lymph node. Previous enlarged lymph node near the head of the pancreas has decreased since prior study. Biliary stent is in place. Prostate gland is enlarged. Bladder wall is thickened, suggesting cystitis. Electronically Signed   By: WiLucienne Capers.D.   On: 04/23/2015 23:44   Dg Chest Port 1 View  04/23/2015  CLINICAL DATA:  Fever, weakness and nausea for 1 day. Hypotension today. Lung cancer patient. Insert image EXAM: PORTABLE CHEST 1 VIEW COMPARISON:  CT chest 01/02/2015.  PA and  lateral chest 12/24/2014. FINDINGS: Port-A-Cath is in place, unchanged. The lungs are clear. Mediastinal lymphadenopathy appears improved. Heart size is normal. No pneumothorax or pleural effusion. IMPRESSION: No acute disease.  Mediastinal lymphadenopathy appears improved. Electronically Signed   By: ThInge Rise.D.   On: 04/23/2015 19:39    Scheduled Meds: . antiseptic oral rinse  7 mL Mouth Rinse q12n4p  . aztreonam  2 g Intravenous Q8H  . chlorhexidine  15 mL Mouth Rinse BID  . heparin  5,000 Units Subcutaneous 3 times per day  . midodrine  5 mg Oral TID WC  . pantoprazole (PROTONIX) IV  40 mg Intravenous QHS   Continuous Infusions: . feeding supplement (OSMOLITE 1.5 CAL) 1,000 mL (04/25/15 1108)  . norepinephrine (LEVOPHED) Adult infusion Stopped (04/24/15 075409     Time spent: 25 minutes    Christy Ehrsam  [facility] Pager [phone] 7PM-7AM, please contact night-coverage at [email or web address], password [email or web address], 12:37 PM  LOS: 2 days

## 2015-04-25 NOTE — Progress Notes (Signed)
[facility] PROGRESS NOTE  Alwaleed Obeso XLK:[id number] DOB: 1946/03/28 DOA: 04/23/2015 PCP: [name], MD Brief narrative 70 year old male with history of metastatic non-small cell lung cancer (adenocarcinoma) diagnosed in September 2010 status post chemotherapy and radiation and was on maintenance chemoimmunotherapy on September 2016 and was referred to home hospice given disease progression. However patient has been doing well on hospice. He has a PEG tube placed and receives full nutrition fluid. 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 On 1/25 he was brought to the ED by EMS for fever 102F, weakness, abdominal pain for one week associated with nausea. When EMS evaluated the patient he was hypotensive to 60/40 mmHg. In the ED remained hypotensive with systolic blood pressure in the mid 70s, workup showed a wbc of 12.7 K, lactate of 4.15, acute kidney injury with creatinine of 1.39 and transaminitis. Patient admitted to ICU under critical care service for septic shock and started on Levophed as family requested for pressors.  Assessment/Plan: Septic shock No clear etiology. Blood cultures on admission 2 growing gram negative rods. UA urine culture unremarkable. Was on empiric vancomycin ,  Aztreonam.. Discontinued vancomycin. follow sensitivity. -Patient informed of having indirect laryngoscopy on 1/23. Has poor dentition. 2-D echo ordered to rule out any vegetations. -Sepsis has resolved. Off Levophed drip for 24 hours. Blood pressure normal. Discontinued IV fluids, narrow abx, d/c foley, patient stable to be transferred to telemetry. Ordered PT evaluation. Resumed tube feeds.  Metastatic non-small cell lung cancer (adenocarcinoma) Followed by Dr. [name] and had received chemotherapy and radiation which was stopped in September 2016 due to disease progression and was referred to home hospice. Patient currently being followed by  hospice. reports that he has done quite well and is interested in following up with Dr. [name] again.  Acute kidney injury Prerenal secondary to dehydration and septic shock. Improved with hydration.  Transaminitis Possibly due to shock liver. Slowly improving. Monitor LFTs today.  Severe protein calorie malnutrition Resume tube feeds.  Hypomagnesemia Replenish  vertigo Getting outpatient vestibular rehabilitation. Will need to note to go back to rehabilitation upon discharge.  Diet: Resume tube feeds to goal  DVT prophylaxis: Subcutaneous heparin   Code Status: DO NOT RESUSCITATE Family Communication: None at bedside Disposition Plan: For to telemetry. PT evaluation. Home once final blood cultures obtained. Possibly in the next 24-48 hours   Consultants:  PC CM  Procedures:  2-D echo  Antibiotics:  Vancomycin and aztreonam 1/25  HPI/Subjective: Seen and examined. Denies any symptoms. Blood pressure remains stable on gentle IV hydration.  Objective: Filed Vitals:   04/25/15 1100 04/25/15 1200  BP:  156/87  Pulse: 84 83  Temp:    Resp: 20 17    Intake/Output Summary (Last 24 hours) at 04/25/15 1218 Last data filed at 04/25/15 0500  Gross per 24 hour  Intake 1837.5 ml  Output   1800 ml  Net   37.5 ml   Filed Weights   04/23/15 2211 04/24/15 0500 04/25/15 0500  Weight: 56.8 kg (125 lb 3.5 oz) 56.1 kg (123 lb 10.9 oz) 56.2 kg (123 lb 14.4 oz)    Exam:   General:  Elderly male lying in bed appears fatigued  HEENT: Temporal wasting, pallor present, moist mucosa  Cardiovascular: Normal S1 and S2, no murmurs  Respiratory: Clear bilaterally, no added sounds, Port-A-Cath+  Abdomen: Soft, nondistended, PEG tube in place, bowel sounds present, foley +  Musculoskeletal: Warm, no edema  CNS: Alert and  Oriented   Data Reviewed: Basic Metabolic Panel:  Recent Labs Lab 04/23/15 1903 04/24/15 0415  NA 139 139  K 3.6 3.8  CL 105 111  CO2 21*  21*  GLUCOSE 79 94  BUN 41* 31*  CREATININE 1.39* 1.17  CALCIUM 8.8* 8.2*  MG  --  1.4*  PHOS  --  3.1   Liver Function Tests:  Recent Labs Lab 04/23/15 1903 04/24/15 0415  AST 226* 142*  ALT 402* 296*  ALKPHOS 501* 354*  BILITOT 4.5* 4.5*  PROT 5.7* 5.1*  ALBUMIN 3.1* 2.5*   No results for input(s): LIPASE, AMYLASE in the last 168 hours. No results for input(s): AMMONIA in the last 168 hours. CBC:  Recent Labs Lab 04/23/15 1903 04/24/15 0415  WBC 12.7* 16.5*  NEUTROABS 11.7*  --   HGB 11.4* 10.2*  HCT 34.1* 30.1*  MCV 103.6* 103.1*  PLT 150 137*   Cardiac Enzymes:  Recent Labs Lab 04/24/15 0203 04/24/15 0835 04/24/15 1439 04/24/15 2112 04/25/15 0225  TROPONINI 0.10* 0.21* 0.20* 0.14* 0.13*   BNP (last 3 results) No results for input(s): BNP in the last 8760 hours.  ProBNP (last 3 results) No results for input(s): PROBNP in the last 8760 hours.  CBG: No results for input(s): GLUCAP in the last 168 hours.  Recent Results (from the past 240 hour(s))  Culture, blood (routine x 2)     Status: None (Preliminary result)   Collection Time: 04/23/15  7:03 PM  Result Value Ref Range Status   Specimen Description BLOOD LEFT HAND  Final   Special Requests BOTTLES DRAWN AEROBIC AND ANAEROBIC 5ML  Final   Culture  Setup Time   Final    GRAM NEGATIVE RODS IN BOTH AEROBIC AND ANAEROBIC BOTTLES CRITICAL RESULT CALLED TO, READ BACK BY AND VERIFIED WITH: A. Koontz 11:00 04/24/15 ([email or web address])    Culture   Final    GRAM NEGATIVE RODS CULTURE REINCUBATED FOR BETTER GROWTH Performed at [facility]    Report Status PENDING  Incomplete  Culture, blood (routine x 2)     Status: None (Preliminary result)   Collection Time: 04/23/15  8:55 PM  Result Value Ref Range Status   Specimen Description BLOOD RIGHT CHEST PORTA CATH  Final   Special Requests BOTTLES DRAWN AEROBIC AND ANAEROBIC 5CC  Final   Culture  Setup Time   Final    GRAM NEGATIVE RODS IN BOTH  AEROBIC AND ANAEROBIC BOTTLES CRITICAL RESULT CALLED TO, READ BACK BY AND VERIFIED WITH: Velta Addison RN 11:00 04/24/15 ([email or web address])    Culture   Final    GRAM NEGATIVE RODS CULTURE REINCUBATED FOR BETTER GROWTH Performed at [facility]    Report Status PENDING  Incomplete  Urine culture     Status: None   Collection Time: 04/23/15  9:00 PM  Result Value Ref Range Status   Specimen Description URINE, CATHETERIZED  Final   Special Requests NONE  Final   Culture   Final    NO GROWTH 2 DAYS Performed at [facility]    Report Status 04/25/2015 FINAL  Final  MRSA PCR Screening     Status: None   Collection Time: 04/23/15 10:35 PM  Result Value Ref Range Status   MRSA by PCR NEGATIVE NEGATIVE Final    Comment:        The GeneXpert MRSA Assay (FDA approved for NASAL specimens only), is one component of a comprehensive MRSA colonization surveillance program. It is not intended to  diagnose MRSA infection nor to guide or monitor treatment for MRSA infections.      Studies: Ct Abdomen Pelvis W Contrast  04/23/2015  CLINICAL DATA:  Fever, weakness, stomach pain, and nausea for 1 day. Hypotension this afternoon. Non-small-cell lung cancer diagnosed September 2010. No active treatment. EXAM: CT ABDOMEN AND PELVIS WITH CONTRAST TECHNIQUE: Multidetector CT imaging of the abdomen and pelvis was performed using the standard protocol following bolus administration of intravenous contrast. CONTRAST:  23m OMNIPAQUE IOHEXOL 300 MG/ML SOLN, 1059mOMNIPAQUE IOHEXOL 300 MG/ML SOLN COMPARISON:  09/21/2014 FINDINGS: Small bilateral pleural effusions with basilar atelectasis. Cystic changes in the lung bases may represent honeycomb change or emphysema. Coronary artery calcifications. Biliary stent in place. Mild bile duct dilatation with pneumobilia. Enlarged lymph node adjacent to the pancreatic head seen previously has significantly decreased in size. No significant residual tumor identified.  No pancreatic ductal dilatation and pancreatic parenchyma appears normal. The gallbladder, spleen, adrenal glands, are unremarkable. There is an enlarging posterior paracaval lymph node which extends into the right psoas muscle. This lymph node was present previously but is enlarging common now measuring 3.6 cm in diameter. Additional scattered retroperitoneal lymph nodes are not significantly enlarged. Cyst in the left kidney. No hydronephrosis in either kidney. Percutaneous gastrostomy tube. Stomach and small bowel are decompressed. Contrast material flows through to the rectum suggesting no evidence of bowel or colonic obstruction. No free air in the abdomen. There is some infiltration or edema in the right upper quadrant fat. Diffuse calcification and atherosclerotic change in the abdominal aorta without aneurysm. Pelvis: Bladder is decompressed with a Foley catheter. Gas in the bladder consistent with catheterization. Diffuse bladder wall thickening likely indicate infection. No free or loculated pelvic fluid collections. No pelvic mass or lymphadenopathy. Prostate gland is mildly enlarged, measuring 4.9 cm transverse diameter. Vascular calcifications in the iliac and femoral arteries. No destructive bone lesions. Degenerative changes in the spine and hips. IMPRESSION: Small bilateral pleural effusions with basilar atelectasis. Enlarging posterior retroperitoneal paracaval lymph node. Previous enlarged lymph node near the head of the pancreas has decreased since prior study. Biliary stent is in place. Prostate gland is enlarged. Bladder wall is thickened, suggesting cystitis. Electronically Signed   By: WiLucienne Capers.D.   On: 04/23/2015 23:44   Dg Chest Port 1 View  04/23/2015  CLINICAL DATA:  Fever, weakness and nausea for 1 day. Hypotension today. Lung cancer patient. Insert image EXAM: PORTABLE CHEST 1 VIEW COMPARISON:  CT chest 01/02/2015.  PA and lateral chest 12/24/2014. FINDINGS: Port-A-Cath is  in place, unchanged. The lungs are clear. Mediastinal lymphadenopathy appears improved. Heart size is normal. No pneumothorax or pleural effusion. IMPRESSION: No acute disease.  Mediastinal lymphadenopathy appears improved. Electronically Signed   By: ThInge Rise.D.   On: 04/23/2015 19:39    Scheduled Meds: . antiseptic oral rinse  7 mL Mouth Rinse q12n4p  . aztreonam  2 g Intravenous Q8H  . chlorhexidine  15 mL Mouth Rinse BID  . heparin  5,000 Units Subcutaneous 3 times per day  . midodrine  5 mg Oral TID WC  . pantoprazole (PROTONIX) IV  40 mg Intravenous QHS   Continuous Infusions: . feeding supplement (OSMOLITE 1.5 CAL) 1,000 mL (04/25/15 1108)  . norepinephrine (LEVOPHED) Adult infusion Stopped (04/24/15 076283     Time spent: 25 minutes    Trysta Showman  [facility] Pager [phone] 7PM-7AM, please contact night-coverage at [email or web address], password [email or web address], 12:18 PM  LOS: 2 days

## 2015-04-25 NOTE — Progress Notes (Signed)
  Echocardiogram 2D Echocardiogram has been performed.  Darlina Sicilian M 04/25/2015, 9:48 AM

## 2015-04-26 DIAGNOSIS — E876 Hypokalemia: Secondary | ICD-10-CM

## 2015-04-26 LAB — COMPREHENSIVE METABOLIC PANEL
ALBUMIN: 2.5 g/dL — AB (ref 3.5–5.0)
ALK PHOS: 381 U/L — AB (ref 38–126)
ALT: 182 U/L — AB (ref 17–63)
AST: 84 U/L — ABNORMAL HIGH (ref 15–41)
Anion gap: 8 (ref 5–15)
BUN: 23 mg/dL — ABNORMAL HIGH (ref 6–20)
CALCIUM: 8.8 mg/dL — AB (ref 8.9–10.3)
CO2: 22 mmol/L (ref 22–32)
CREATININE: 0.89 mg/dL (ref 0.61–1.24)
Chloride: 116 mmol/L — ABNORMAL HIGH (ref 101–111)
GFR calc non Af Amer: >60 mL/min (ref >60)
GLUCOSE: 166 mg/dL — AB (ref 65–99)
Potassium: 3.3 mmol/L — ABNORMAL LOW (ref 3.5–5.1)
SODIUM: 146 mmol/L — AB (ref 135–145)
Total Bilirubin: 5.3 mg/dL — ABNORMAL HIGH (ref 0.3–1.2)
Total Protein: 5.5 g/dL — ABNORMAL LOW (ref 6.5–8.1)

## 2015-04-26 LAB — CBC
HCT: 30.1 % — ABNORMAL LOW (ref 39.0–52.0)
Hemoglobin: 10.2 g/dL — ABNORMAL LOW (ref 13.0–17.0)
MCH: 34.7 pg — AB (ref 26.0–34.0)
MCHC: 33.9 g/dL (ref 30.0–36.0)
MCV: 102.4 fL — ABNORMAL HIGH (ref 78.0–100.0)
PLATELETS: 150 10*3/uL (ref 150–400)
RBC: 2.94 MIL/uL — ABNORMAL LOW (ref 4.22–5.81)
RDW: 14.1 % (ref 11.5–15.5)
WBC: 6.9 10*3/uL (ref 4.0–10.5)

## 2015-04-26 MED ORDER — POTASSIUM CHLORIDE 20 MEQ/15ML (10%) PO SOLN
40.0000 meq | Freq: Once | ORAL | Status: AC
  Administered 2015-04-26: 40 meq
  Filled 2015-04-26: qty 30

## 2015-04-26 MED ORDER — SODIUM CHLORIDE 0.9% FLUSH
10.0000 mL | INTRAVENOUS | Status: DC | PRN
  Administered 2015-04-26 – 2015-04-29 (×3): 10 mL
  Filled 2015-04-26 (×2): qty 40

## 2015-04-26 MED ORDER — HYDRALAZINE HCL 20 MG/ML IJ SOLN
10.0000 mg | Freq: Four times a day (QID) | INTRAMUSCULAR | Status: DC | PRN
  Administered 2015-04-26: 10 mg via INTRAVENOUS
  Filled 2015-04-26: qty 1

## 2015-04-26 NOTE — Progress Notes (Signed)
Pt with elevated bp last night and this AM. Pt on midodrine PO. MD made aware. Order D/c'd and new order given. See MAR. Madelin Headings.

## 2015-04-26 NOTE — Progress Notes (Signed)
[facility] PROGRESS NOTE  [name] WJX:[id number] DOB: 05/27/1945 DOA: 04/23/2015 PCP: [name], MD Brief narrative 70 year old male with history of metastatic non-small cell lung cancer (adenocarcinoma) diagnosed in September 2010 status post chemotherapy and radiation and was on maintenance chemoimmunotherapy on September 2016 and was referred to home hospice given disease progression. However patient has been doing well on hospice. He has a PEG tube placed and receives full nutrition fluid. 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 On 1/25 he was brought to the ED by EMS for fever 102F, weakness, abdominal pain for one week associated with nausea. When EMS evaluated the patient he was hypotensive to 60/40 mmHg. In the ED remained hypotensive with systolic blood pressure in the mid 70s, workup showed a wbc of 12.7 K, lactate of 4.15, acute kidney injury with creatinine of 1.39 and transaminitis. Patient admitted to ICU under critical care service for septic shock and started on Levophed as family requested for pressors.  Assessment/Plan: Septic shock . Blood cultures on admission 2 growing gram negative rods. UA urine culture unremarkable. Was on empiric vancomycin ,  Aztreonam.. Discontinued vancomycin. follow sensitivity. -Patient informed of having indirect laryngoscopy on 1/23. Has poor dentition. 2-D echo negative for any vegetations. -Sepsis  resolved. Off pressors. Blood pressure now elevated.. Hold midodrine ( pt on it as outpt for low BP and dizziness). Added prn hydralazine Discontinued IV fluids, ,  PT evaluation. Resumed tube feeds.  Metastatic non-small cell lung cancer (adenocarcinoma) Followed by Dr. [name] and had received chemotherapy and radiation which was stopped in September 2016 due to disease progression and was referred to home hospice. Patient currently being followed by hospice. reports that he has  done quite well and is interested in following up with Dr. [name] again.  Acute kidney injury Prerenal secondary to dehydration and septic shock. Improved with hydration.  Transaminitis Possibly due to shock liver versus biliary obstruction from metastatic cancer. Slowly improving. Monitor LFTs    Hypokalemia Replenished  Severe protein calorie malnutrition Resume tube feeds.  Hypomagnesemia Replenish  vertigo Getting outpatient vestibular rehabilitation. Will need to note to go back to rehabilitation upon discharge.  Diet: tube feeds , increase to 50 cc/ hr ( goal 60cc/ hr)  DVT prophylaxis: Subcutaneous heparin   Code Status: DO NOT RESUSCITATE Family Communication: None at bedside Disposition Plan: continue  telemetry. PT evaluation. Home once final blood cultures obtained. Possibly tomorrow   Consultants:  PC CM  Procedures:  2-D echo  Antibiotics:  Vancomycin and aztreonam 1/25  HPI/Subjective: Seen and examined. Denies any symptoms  Objective: Filed Vitals:   04/26/15 0453 04/26/15 0938  BP: 151/86 150/99  Pulse: 92 94  Temp: 99.3 F (37.4 C)   Resp: 16     Intake/Output Summary (Last 24 hours) at 04/26/15 1143 Last data filed at 04/26/15 0940  Gross per 24 hour  Intake    780 ml  Output   1451 ml  Net   -671 ml   Filed Weights   04/24/15 0500 04/25/15 0500 04/26/15 0453  Weight: 56.1 kg (123 lb 10.9 oz) 56.2 kg (123 lb 14.4 oz) 54.1 kg (119 lb 4.3 oz)    Exam:   General:  Elderly male lying in bed appears fatigued  HEENT: Temporal wasting, pallor present, moist mucosa  Cardiovascular: Normal S1 and S2, no murmurs  Respiratory: Clear bilaterally, no added sounds, Port-A-Cath+  Abdomen: Soft, nondistended, PEG tube in place, bowel sounds present,  Musculoskeletal: Warm, no edema  CNS: Alert and Oriented   Data Reviewed: Basic Metabolic Panel:  Recent Labs Lab 04/23/15 1903 04/24/15 0415 04/25/15 1350 04/26/15 0638  NA  139 139 145 146*  K 3.6 3.8 3.2* 3.3*  CL 105 111 115* 116*  CO2 21* 21* 19* 22  GLUCOSE 79 94 90 166*  BUN 41* 31* 26* 23*  CREATININE 1.39* 1.17 0.95 0.89  CALCIUM 8.8* 8.2* 9.0 8.8*  MG  --  1.4*  --   --   PHOS  --  3.1  --   --    Liver Function Tests:  Recent Labs Lab 04/23/15 1903 04/24/15 0415 04/25/15 1350 04/26/15 0638  AST 226* 142* 63* 84*  ALT 402* 296* 197* 182*  ALKPHOS 501* 354* 343* 381*  BILITOT 4.5* 4.5* 5.3* 5.3*  PROT 5.7* 5.1* 5.4* 5.5*  ALBUMIN 3.1* 2.5* 2.6* 2.5*   No results for input(s): LIPASE, AMYLASE in the last 168 hours. No results for input(s): AMMONIA in the last 168 hours. CBC:  Recent Labs Lab 04/23/15 1903 04/24/15 0415 04/26/15 0638  WBC 12.7* 16.5* 6.9  NEUTROABS 11.7*  --   --   HGB 11.4* 10.2* 10.2*  HCT 34.1* 30.1* 30.1*  MCV 103.6* 103.1* 102.4*  PLT 150 137* 150   Cardiac Enzymes:  Recent Labs Lab 04/24/15 0203 04/24/15 0835 04/24/15 1439 04/24/15 2112 04/25/15 0225  TROPONINI 0.10* 0.21* 0.20* 0.14* 0.13*   BNP (last 3 results) No results for input(s): BNP in the last 8760 hours.  ProBNP (last 3 results) No results for input(s): PROBNP in the last 8760 hours.  CBG: No results for input(s): GLUCAP in the last 168 hours.  Recent Results (from the past 240 hour(s))  Culture, blood (routine x 2)     Status: None (Preliminary result)   Collection Time: 04/23/15  7:03 PM  Result Value Ref Range Status   Specimen Description BLOOD LEFT HAND  Final   Special Requests BOTTLES DRAWN AEROBIC AND ANAEROBIC 5ML  Final   Culture  Setup Time   Final    GRAM NEGATIVE RODS IN BOTH AEROBIC AND ANAEROBIC BOTTLES CRITICAL RESULT CALLED TO, READ BACK BY AND VERIFIED WITH: A. Koontz 11:00 04/24/15 ([email or web address])    Culture   Final    GRAM NEGATIVE RODS CULTURE REINCUBATED FOR BETTER GROWTH Performed at [facility]    Report Status PENDING  Incomplete  Culture, blood (routine x 2)     Status: None (Preliminary  result)   Collection Time: 04/23/15  8:55 PM  Result Value Ref Range Status   Specimen Description BLOOD RIGHT CHEST PORTA CATH  Final   Special Requests BOTTLES DRAWN AEROBIC AND ANAEROBIC 5CC  Final   Culture  Setup Time   Final    GRAM NEGATIVE RODS IN BOTH AEROBIC AND ANAEROBIC BOTTLES CRITICAL RESULT CALLED TO, READ BACK BY AND VERIFIED WITH: Velta Addison RN 11:00 04/24/15 ([email or web address])    Culture   Final    GRAM NEGATIVE RODS IDENTIFICATION AND SUSCEPTIBILITIES TO FOLLOW Performed at [facility]    Report Status PENDING  Incomplete  Urine culture     Status: None   Collection Time: 04/23/15  9:00 PM  Result Value Ref Range Status   Specimen Description URINE, CATHETERIZED  Final   Special Requests NONE  Final   Culture   Final    NO GROWTH 2 DAYS Performed at [facility]    Report Status 04/25/2015 FINAL  Final  MRSA PCR Screening     Status: None   Collection Time: 04/23/15 10:35 PM  Result Value Ref Range Status   MRSA by PCR NEGATIVE NEGATIVE Final    Comment:        The GeneXpert MRSA Assay (FDA approved for NASAL specimens only), is one component of a comprehensive MRSA colonization surveillance program. It is not intended to diagnose MRSA infection nor to guide or monitor treatment for MRSA infections.   Culture, blood (routine x 2)     Status: None (Preliminary result)   Collection Time: 04/25/15  4:30 PM  Result Value Ref Range Status   Specimen Description BLOOD RIGHT ARM  Final   Special Requests BOTTLES DRAWN AEROBIC AND ANAEROBIC  10CC  Final   Culture   Final    NO GROWTH < 24 HOURS Performed at [facility]    Report Status PENDING  Incomplete  Culture, blood (routine x 2)     Status: None (Preliminary result)   Collection Time: 04/25/15  4:40 PM  Result Value Ref Range Status   Specimen Description BLOOD RIGHT HAND  Final   Special Requests BOTTLES DRAWN AEROBIC AND ANAEROBIC 4CC,5CC  Final   Culture   Final    NO GROWTH <  24 HOURS Performed at [facility]    Report Status PENDING  Incomplete     Studies: No results found.  Scheduled Meds: . antiseptic oral rinse  7 mL Mouth Rinse q12n4p  . aztreonam  2 g Intravenous Q8H  . chlorhexidine  15 mL Mouth Rinse BID  . heparin  5,000 Units Subcutaneous 3 times per day  . pantoprazole (PROTONIX) IV  40 mg Intravenous QHS   Continuous Infusions: . feeding supplement (OSMOLITE 1.5 CAL) 1,000 mL (04/25/15 1108)      Time spent: 25 minutes    Treshon Stannard  [facility] Pager [phone]. If 7PM-7AM, please contact night-coverage at [email or web address], password [facility] 04/26/2015, 11:43 AM  LOS: 3 days

## 2015-04-26 NOTE — Evaluation (Signed)
Physical Therapy Evaluation Patient Details Name: [name] MRN: [id number] DOB: 1945-10-09 Today's Date: 04/26/2015   History of Present Illness  70 yo male admitted with septic shock. Hx of metastatic lung cancer, HTn, vertgio, HTN, PEG  Clinical Impression  On eval, pt was Min guard assist for mobility-walked ~150 feet with RW. Pt did c/o some lightheadedness. Generally weak and dyspnea with activity. Recommend pt resume OP PT.     Follow Up Recommendations Outpatient PT;Supervision for mobility/OOB (resume services-pt participating prior to admission)    Equipment Recommendations  None recommended by PT    Recommendations for Other Services       Precautions / Restrictions Precautions Precautions: Fall Restrictions Weight Bearing Restrictions: No      Mobility  Bed Mobility Overal bed mobility: Needs Assistance Bed Mobility: Supine to Sit;Sit to Supine     Supine to sit: Supervision Sit to supine: Supervision   General bed mobility comments: for safety, lines  Transfers Overall transfer level: Needs assistance Equipment used: Rolling walker (2 wheeled) Transfers: Sit to/from Stand Sit to Stand: Supervision         General transfer comment: for safety. VCs hand placement  Ambulation/Gait Ambulation/Gait assistance: Min guard Ambulation Distance (Feet): 150 Feet Assistive device: Rolling walker (2 wheeled) Gait Pattern/deviations: Step-through pattern;Decreased stride length     General Gait Details: Pt c/o lightheadedness. 1 brief standing rest break taken. Dyspnea 2/4.   Stairs            Wheelchair Mobility    Modified Rankin (Stroke Patients Only)       Balance Overall balance assessment: Needs assistance           Standing balance-Leahy Scale: Poor                               Pertinent Vitals/Pain Pain Assessment: No/denies pain    Home Living Family/patient expects to be discharged to:: Private  residence Living Arrangements: Spouse/significant other Available Help at Discharge: Personal care attendant;Family Type of Home: House Home Access: Stairs to enter Entrance Stairs-Rails: Right Entrance Stairs-Number of Steps: 4 Home Layout: One level Home Equipment: Ward - 2 wheels;Walker - 4 wheels      Prior Function Level of Independence: Needs assistance   Gait / Transfers Assistance Needed: uses standard RW in home; rollator in community  ADL's / Homemaking Assistance Needed: aide assist with bathing. pt able to dress himself        Hand Dominance        Extremity/Trunk Assessment   Upper Extremity Assessment: Overall WFL for tasks assessed           Lower Extremity Assessment: Generalized weakness      Cervical / Trunk Assessment: Normal  Communication   Communication: No difficulties  Cognition Arousal/Alertness: Awake/alert Behavior During Therapy: WFL for tasks assessed/performed Overall Cognitive Status: Within Functional Limits for tasks assessed                      General Comments      Exercises        Assessment/Plan    PT Assessment Patient needs continued PT services  PT Diagnosis Difficulty walking;Generalized weakness   PT Problem List Decreased strength;Decreased activity tolerance;Decreased balance;Decreased mobility;Decreased knowledge of use of DME  PT Treatment Interventions DME instruction;Gait training;Functional mobility training;Therapeutic activities;Patient/family education;Balance training;Therapeutic exercise   PT Goals (Current goals can be found in the Care Plan  section) Acute Rehab PT Goals Patient Stated Goal: to regain PLOF PT Goal Formulation: With patient Time For Goal Achievement: 05/10/15 Potential to Achieve Goals: Fair    Frequency Min 3X/week   Barriers to discharge        Co-evaluation               End of Session   Activity Tolerance: Patient limited by fatigue Patient left:  with call bell/phone within reach           Time: 9233-0076 PT Time Calculation (min) (ACUTE ONLY): 14 min   Charges:   PT Evaluation $PT Eval Moderate Complexity: 1 Procedure     PT G Codes:        Weston Anna, MPT Pager: [phone]

## 2015-04-27 DIAGNOSIS — B961 Klebsiella pneumoniae [K. pneumoniae] as the cause of diseases classified elsewhere: Secondary | ICD-10-CM

## 2015-04-27 DIAGNOSIS — R1013 Epigastric pain: Secondary | ICD-10-CM

## 2015-04-27 LAB — COMPREHENSIVE METABOLIC PANEL
ALT: 253 U/L — ABNORMAL HIGH (ref 17–63)
ANION GAP: 8 (ref 5–15)
AST: 161 U/L — ABNORMAL HIGH (ref 15–41)
Albumin: 2.7 g/dL — ABNORMAL LOW (ref 3.5–5.0)
Alkaline Phosphatase: 433 U/L — ABNORMAL HIGH (ref 38–126)
BILIRUBIN TOTAL: 5.9 mg/dL — AB (ref 0.3–1.2)
BUN: 20 mg/dL (ref 6–20)
CO2: 22 mmol/L (ref 22–32)
Calcium: 8.9 mg/dL (ref 8.9–10.3)
Chloride: 113 mmol/L — ABNORMAL HIGH (ref 101–111)
Creatinine, Ser: 0.82 mg/dL (ref 0.61–1.24)
GFR calc Af Amer: >60 mL/min (ref >60)
Glucose, Bld: 153 mg/dL — ABNORMAL HIGH (ref 65–99)
POTASSIUM: 3.6 mmol/L (ref 3.5–5.1)
Sodium: 143 mmol/L (ref 135–145)
TOTAL PROTEIN: 5.7 g/dL — AB (ref 6.5–8.1)

## 2015-04-27 LAB — CULTURE, BLOOD (ROUTINE X 2)

## 2015-04-27 LAB — LIPASE, BLOOD: LIPASE: 22 U/L (ref 11–51)

## 2015-04-27 MED ORDER — ALTEPLASE 2 MG IJ SOLR
2.0000 mg | Freq: Once | INTRAMUSCULAR | Status: AC
  Administered 2015-04-27: 2 mg
  Filled 2015-04-27: qty 2

## 2015-04-27 MED ORDER — FREE WATER
200.0000 mL | Status: DC
  Administered 2015-04-27 – 2015-04-28 (×4): 200 mL

## 2015-04-27 MED ORDER — METOCLOPRAMIDE HCL 5 MG/ML IJ SOLN
10.0000 mg | Freq: Four times a day (QID) | INTRAMUSCULAR | Status: DC | PRN
  Administered 2015-04-27 – 2015-04-28 (×3): 10 mg via INTRAVENOUS
  Filled 2015-04-27 (×3): qty 2

## 2015-04-27 MED ORDER — OSMOLITE 1.5 CAL PO LIQD
1000.0000 mL | ORAL | Status: DC
  Administered 2015-04-28: 1000 mL via JEJUNOSTOMY
  Filled 2015-04-27 (×3): qty 1000

## 2015-04-27 MED ORDER — DEXTROSE 5 % IV SOLN
2.0000 g | INTRAVENOUS | Status: DC
  Administered 2015-04-27 – 2015-04-29 (×3): 2 g via INTRAVENOUS
  Filled 2015-04-27 (×3): qty 2

## 2015-04-27 NOTE — Progress Notes (Signed)
Nutrition Brief Note  RD contacted by RN regarding free water flushes. IVF have been stopped. RN reports dark colored urine. Pt was initially assessed 1/26. RD has not been consulted for TF management.  Patient receiving Osmolite 1.5 @ 40 ml/hr which provides 1440 kcal, 60g protein and 732 ml of H20. If tolerated, recommend advancing to goal rate of 60 ml/hr which will provide 2160 kcal, 90g protein and 1097 ml of H2O.   Free water flush recommendations: 200 ml every 4 hours (1200 ml daily) Free water + TF formula will provide 2297 ml H20  RD to follow-up. If other nutrition issues arise, please consult RD.   Clayton Bibles, MS, RD, LDN Pager: [phone] After Hours Pager: [phone]

## 2015-04-27 NOTE — Progress Notes (Signed)
[facility] PROGRESS NOTE  [name] XNT:[id number] DOB: [birth year] DOA: 04/23/2015 PCP: [name], MD Brief narrative 70 year old male with history of metastatic non-small cell lung cancer (adenocarcinoma) diagnosed in September 2010 status post chemotherapy and radiation and was on maintenance chemoimmunotherapy on September 2016 and was referred to home hospice given disease progression. However patient has been doing well on hospice. He has a PEG tube placed and receives full nutrition fluid. 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 On 1/25 he was brought to the ED by EMS for fever 102F, weakness, abdominal pain for one week associated with nausea. When EMS evaluated the patient he was hypotensive to 60/40 mmHg. In the ED remained hypotensive with systolic blood pressure in the mid 70s, workup showed a wbc of 12.7 K, lactate of 4.15, acute kidney injury with creatinine of 1.39 and transaminitis. Patient admitted to ICU under critical care service for septic shock and started on Levophed as family requested for pressors.  Assessment/Plan: Septic shock . Blood cultures on admission 2 growing Klebsiella and Enterobacter both sensitive to Rocephin. Antibiotic changed to IV Rocephin and will plan on 2 weeks course upon discharge. Can be given through his Port-A-Cath...  urine culture unremarkable.  - 2-D echo negative for any vegetations. -Sepsis  resolved. Off pressors. Blood pressure now elevated.. Hold midodrine ( pt on it as outpt for low BP and dizziness). Added prn hydralazine. -tube Feeds resumed. Off  IV fluids. PT recommends outpatient physical therapy. (Gets outpatient vestibular rehabilitation as planned.  Metastatic non-small cell lung cancer (adenocarcinoma) Followed by Dr. [name] and had received chemotherapy and radiation which was stopped in September 2016 due to disease progression and was referred to home hospice.  Patient currently being followed by hospice. reports that he has done quite well and is interested in following up with Dr. [name] again.  Abdominal pain with significant transaminitis Initially thought to be due to septic shock. However patient has worsened transaminitis and elevated bilirubin. Also having epigastric pain with nausea. CT scan on admission unremarkable. Will check abdominal ultrasound. Check lipase and hepatitis panel. Held to feeds. Patient reports taking Reglan at home for nausea which has been resumed. Continue PPI.   Acute kidney injury Prerenal secondary to dehydration and septic shock. Improved with hydration.   Hypokalemia Replenished  Severe protein calorie malnutrition Tube feeds resumed.  Hypomagnesemia Replenish  vertigo Getting outpatient vestibular rehabilitation. Will need to note to go back to rehabilitation upon discharge.  Diet: tube feeds , (held for ultrasound abdomen and abdominal pain)  DVT prophylaxis: Subcutaneous heparin   Code Status: DO NOT RESUSCITATE Family Communication: Wife at bedside Disposition Plan: Discharge held for abdominal pain and nausea with worsening transaminitis. Home once improved.   Consultants:  PC CM  Procedures:  2-D echo  Antibiotics:  Vancomycin and aztreonam 1/25  HPI/Subjective: Seen and examined. Plans of nausea and epigastric pain.  Objective: Filed Vitals:   04/26/15 2127 04/27/15 0547  BP: 161/95 119/78  Pulse: 94 99  Temp: 98 F (36.7 C) 98.2 F (36.8 C)  Resp: 16 16    Intake/Output Summary (Last 24 hours) at 04/27/15 1311 Last data filed at 04/27/15 0800  Gross per 24 hour  Intake 1324.5 ml  Output   1975 ml  Net -650.5 ml   Filed Weights   04/25/15 0500 04/26/15 0453 04/27/15 0547  Weight: 56.2 kg (123 lb 14.4 oz) 54.1 kg (119 lb 4.3 oz) 52.4 kg (115 lb  8.3 oz)    Exam:   General:  appears fatigued  HEENT: Temporal wasting, , moist mucosa  Cardiovascular:  Normal S1 and S2, no murmurs  Respiratory: Clear bilaterally, no added sounds, Port-A-Cath+  Abdomen: Soft, nondistended, epigastric tenderness, PEG tube in place, bowel sounds present,  Musculoskeletal: Warm, no edema  CNS: Alert and Oriented   Data Reviewed: Basic Metabolic Panel:  Recent Labs Lab 04/23/15 1903 04/24/15 0415 04/25/15 1350 04/26/15 0638 04/27/15 0527  NA 139 139 145 146* 143  K 3.6 3.8 3.2* 3.3* 3.6  CL 105 111 115* 116* 113*  CO2 21* 21* 19* 22 22  GLUCOSE 79 94 90 166* 153*  BUN 41* 31* 26* 23* 20  CREATININE 1.39* 1.17 0.95 0.89 0.82  CALCIUM 8.8* 8.2* 9.0 8.8* 8.9  MG  --  1.4*  --   --   --   PHOS  --  3.1  --   --   --    Liver Function Tests:  Recent Labs Lab 04/23/15 1903 04/24/15 0415 04/25/15 1350 04/26/15 0638 04/27/15 0527  AST 226* 142* 63* 84* 161*  ALT 402* 296* 197* 182* 253*  ALKPHOS 501* 354* 343* 381* 433*  BILITOT 4.5* 4.5* 5.3* 5.3* 5.9*  PROT 5.7* 5.1* 5.4* 5.5* 5.7*  ALBUMIN 3.1* 2.5* 2.6* 2.5* 2.7*   No results for input(s): LIPASE, AMYLASE in the last 168 hours. No results for input(s): AMMONIA in the last 168 hours. CBC:  Recent Labs Lab 04/23/15 1903 04/24/15 0415 04/26/15 0638  WBC 12.7* 16.5* 6.9  NEUTROABS 11.7*  --   --   HGB 11.4* 10.2* 10.2*  HCT 34.1* 30.1* 30.1*  MCV 103.6* 103.1* 102.4*  PLT 150 137* 150   Cardiac Enzymes:  Recent Labs Lab 04/24/15 0203 04/24/15 0835 04/24/15 1439 04/24/15 2112 04/25/15 0225  TROPONINI 0.10* 0.21* 0.20* 0.14* 0.13*   BNP (last 3 results) No results for input(s): BNP in the last 8760 hours.  ProBNP (last 3 results) No results for input(s): PROBNP in the last 8760 hours.  CBG: No results for input(s): GLUCAP in the last 168 hours.  Recent Results (from the past 240 hour(s))  Culture, blood (routine x 2)     Status: None (Preliminary result)   Collection Time: 04/23/15  7:03 PM  Result Value Ref Range Status   Specimen Description BLOOD LEFT HAND   Final   Special Requests BOTTLES DRAWN AEROBIC AND ANAEROBIC 5ML  Final   Culture  Setup Time   Final    GRAM NEGATIVE RODS IN BOTH AEROBIC AND ANAEROBIC BOTTLES CRITICAL RESULT CALLED TO, READ BACK BY AND VERIFIED WITH: A. Koontz 11:00 04/24/15 ([email or web address])    Culture   Final    GRAM NEGATIVE RODS IDENTIFICATION TO FOLLOW Performed at [facility]    Report Status PENDING  Incomplete  Culture, blood (routine x 2)     Status: None   Collection Time: 04/23/15  8:55 PM  Result Value Ref Range Status   Specimen Description BLOOD RIGHT CHEST PORTA CATH  Final   Special Requests BOTTLES DRAWN AEROBIC AND ANAEROBIC 5CC  Final   Culture  Setup Time   Final    GRAM NEGATIVE RODS IN BOTH AEROBIC AND ANAEROBIC BOTTLES CRITICAL RESULT CALLED TO, READ BACK BY AND VERIFIED WITH: Velta Addison RN 11:00 04/24/15 ([email or web address])    Culture   Final    KLEBSIELLA SPECIES ENTEROBACTER CLOACAE Performed at [facility]    Report Status 04/27/2015 FINAL  Final  Organism ID, Bacteria KLEBSIELLA SPECIES  Final   Organism ID, Bacteria ENTEROBACTER CLOACAE  Final      Susceptibility   Enterobacter cloacae - MIC*    CEFAZOLIN >=64 RESISTANT Resistant     CEFEPIME <=1 SENSITIVE Sensitive     CEFTAZIDIME <=1 SENSITIVE Sensitive     CEFTRIAXONE <=1 SENSITIVE Sensitive     CIPROFLOXACIN <=0.25 SENSITIVE Sensitive     GENTAMICIN <=1 SENSITIVE Sensitive     IMIPENEM <=0.25 SENSITIVE Sensitive     TRIMETH/SULFA <=20 SENSITIVE Sensitive     PIP/TAZO <=4 SENSITIVE Sensitive     * ENTEROBACTER CLOACAE   Klebsiella species - MIC*    AMPICILLIN >=32 RESISTANT Resistant     CEFAZOLIN <=4 SENSITIVE Sensitive     CEFEPIME <=1 SENSITIVE Sensitive     CEFTAZIDIME <=1 SENSITIVE Sensitive     CEFTRIAXONE <=1 SENSITIVE Sensitive     CIPROFLOXACIN <=0.25 SENSITIVE Sensitive     GENTAMICIN <=1 SENSITIVE Sensitive     IMIPENEM <=0.25 SENSITIVE Sensitive     TRIMETH/SULFA <=20 SENSITIVE Sensitive      AMPICILLIN/SULBACTAM 4 SENSITIVE Sensitive     PIP/TAZO <=4 SENSITIVE Sensitive     * KLEBSIELLA SPECIES  Urine culture     Status: None   Collection Time: 04/23/15  9:00 PM  Result Value Ref Range Status   Specimen Description URINE, CATHETERIZED  Final   Special Requests NONE  Final   Culture   Final    NO GROWTH 2 DAYS Performed at [facility]    Report Status 04/25/2015 FINAL  Final  MRSA PCR Screening     Status: None   Collection Time: 04/23/15 10:35 PM  Result Value Ref Range Status   MRSA by PCR NEGATIVE NEGATIVE Final    Comment:        The GeneXpert MRSA Assay (FDA approved for NASAL specimens only), is one component of a comprehensive MRSA colonization surveillance program. It is not intended to diagnose MRSA infection nor to guide or monitor treatment for MRSA infections.   Culture, blood (routine x 2)     Status: None (Preliminary result)   Collection Time: 04/25/15  4:30 PM  Result Value Ref Range Status   Specimen Description BLOOD RIGHT ARM  Final   Special Requests BOTTLES DRAWN AEROBIC AND ANAEROBIC  10CC  Final   Culture   Final    NO GROWTH < 24 HOURS Performed at [facility]    Report Status PENDING  Incomplete  Culture, blood (routine x 2)     Status: None (Preliminary result)   Collection Time: 04/25/15  4:40 PM  Result Value Ref Range Status   Specimen Description BLOOD RIGHT HAND  Final   Special Requests BOTTLES DRAWN AEROBIC AND ANAEROBIC 4CC,5CC  Final   Culture   Final    NO GROWTH < 24 HOURS Performed at [facility]    Report Status PENDING  Incomplete     Studies: No results found.  Scheduled Meds: . antiseptic oral rinse  7 mL Mouth Rinse q12n4p  . cefTRIAXone (ROCEPHIN)  IV  2 g Intravenous Q24H  . chlorhexidine  15 mL Mouth Rinse BID  . heparin  5,000 Units Subcutaneous 3 times per day  . pantoprazole (PROTONIX) IV  40 mg Intravenous QHS   Continuous Infusions: . feeding supplement (OSMOLITE 1.5  CAL) 1,000 mL (04/27/15 1021)      Time spent: 25 minutes    Texie Tupou  [facility] Pager  [phone]. If 7PM-7AM, please contact night-coverage at [email or web address], password [facility] 04/27/2015, 1:11 PM  LOS: 4 days

## 2015-04-28 ENCOUNTER — Inpatient Hospital Stay (HOSPITAL_COMMUNITY): Payer: Medicare Other

## 2015-04-28 ENCOUNTER — Ambulatory Visit: Payer: Medicare Other | Admitting: Physical Therapy

## 2015-04-28 LAB — COMPREHENSIVE METABOLIC PANEL
ALK PHOS: 445 U/L — AB (ref 38–126)
ALT: 261 U/L — ABNORMAL HIGH (ref 17–63)
ANION GAP: 10 (ref 5–15)
AST: 150 U/L — ABNORMAL HIGH (ref 15–41)
Albumin: 2.7 g/dL — ABNORMAL LOW (ref 3.5–5.0)
BUN: 19 mg/dL (ref 6–20)
CALCIUM: 9.4 mg/dL (ref 8.9–10.3)
CO2: 24 mmol/L (ref 22–32)
CREATININE: 0.61 mg/dL (ref 0.61–1.24)
Chloride: 110 mmol/L (ref 101–111)
Glucose, Bld: 107 mg/dL — ABNORMAL HIGH (ref 65–99)
Potassium: 3.9 mmol/L (ref 3.5–5.1)
SODIUM: 144 mmol/L (ref 135–145)
TOTAL PROTEIN: 6 g/dL — AB (ref 6.5–8.1)
Total Bilirubin: 7.8 mg/dL — ABNORMAL HIGH (ref 0.3–1.2)

## 2015-04-28 MED ORDER — FREE WATER: 200.0000 mL | Status: DC

## 2015-04-28 MED ORDER — FREE WATER
300.0000 mL | Status: DC
  Administered 2015-04-28 – 2015-04-29 (×5): 300 mL

## 2015-04-28 NOTE — Progress Notes (Signed)
[facility] PROGRESS NOTE  [name] QPY:[id number] DOB: 1945-10-28 DOA: 04/23/2015 PCP: [name], MD Brief narrative 70 year old male with history of metastatic non-small cell lung cancer (adenocarcinoma) diagnosed in September 2010 status post chemotherapy and radiation and was on maintenance chemoimmunotherapy on September 2016 and was referred to home hospice given disease progression. However patient has been doing well on hospice. He has a PEG tube placed and receives full nutrition fluid. 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 On 1/25 he was brought to the ED by EMS for fever 102F, weakness, abdominal pain for one week associated with nausea. When EMS evaluated the patient he was hypotensive to 60/40 mmHg. In the ED remained hypotensive with systolic blood pressure in the mid 70s, workup showed a wbc of 12.7 K, lactate of 4.15, acute kidney injury with creatinine of 1.39 and transaminitis. Patient admitted to ICU under critical care service for septic shock and started on Levophed as family requested for pressors.  Assessment/Plan: Septic shock . Blood cultures on admission 2 growing Klebsiella and Enterobacter both sensitive to Rocephin. Antibiotic changed to IV Rocephin and will plan on 2 weeks course upon discharge. Can be given through his Port-A-Cath...  urine culture unremarkable.  - 2-D echo negative for any vegetations. -Sepsis  resolved. Off pressors. Blood pressure now elevated.. Hold midodrine ( pt on it as outpt for low BP and dizziness). Added prn hydralazine. -tube Feeds resumed. Off  IV fluids. PT recommends outpatient physical therapy. (Gets outpatient vestibular rehabilitation as planned.  Metastatic non-small cell lung cancer (adenocarcinoma) Followed by Dr. [name] and had received chemotherapy and radiation which was stopped in September 2016 due to disease progression and was referred to home hospice.  Patient currently being followed by hospice. reports that he has done quite well and is interested in following up with Dr. [name] again.  Abdominal pain with progressive  transaminitis Initially thought to be due to septic shock. However patient has worsened transaminitis and elevated bilirubin. Also having epigastric pain with nausea. 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  check abdominal ultrasound unremarkable..  -Lipase normal. Follow hepatitis panel. Spoke with Dr. Benson Norway had attempted EUS back in May 2016 and informs me that it was usuccessful procedure since it was extremely difficult to placing a stent. He had a percutaneous stent placed by IR which was then internalized. -Continue Reglan for nausea. Resumed 2 feeds is morning and tolerating well. Continue PPI. -It is unclear if the stent has dislodged or gotten obstructed versus any compressive mass that is contemplating to his transaminitis. I will obtain an MRCP to evaluate for any of suction and stent position.   Acute kidney injury Prerenal secondary to dehydration and septic shock. Improved with hydration.   Hypokalemia Replenished  Severe protein calorie malnutrition Tube feeds resumed. Continue free water.  Hypomagnesemia Replenished  vertigo Getting outpatient vestibular rehabilitation. Will need to note to go back to rehabilitation upon discharge.  Diet: tube feeds   DVT prophylaxis: Subcutaneous heparin   Code Status: DO NOT RESUSCITATE Family Communication: Wife at bedside Disposition Plan: Home once abdominal pain and transaminitis improved.  Consultants:  PC CM  Procedures:  2-D echo  CT abdomen and pelvis  Ultrasound abdomen  MRCP  Antibiotics:  Vancomycin1/25-1/27  Aztreonam 1/25-1/29  Rocephin 1/29--  HPI/Subjective: Seen and examined. Epigastric pain and nausea better.  Objective: Filed Vitals:  04/28/15 0612 04/28/15 1341  BP: 105/74 98/72  Pulse: 96 92  Temp: 97.6 F (36.4 C) 97.9 F (36.6 C)  Resp: 18 17    Intake/Output Summary (Last 24 hours) at 04/28/15 1355 Last data filed at 04/28/15 1341  Gross per 24 hour  Intake    400 ml  Output    975 ml  Net   -575 ml   Filed Weights   04/26/15 0453 04/27/15 0547 04/28/15 0612  Weight: 54.1 kg (119 lb 4.3 oz) 52.4 kg (115 lb 8.3 oz) 51.7 kg (113 lb 15.7 oz)    Exam:   General:  appears fatigued  HEENT: , moist mucosa  Cardiovascular: Normal S1 and S2, no murmurs  Respiratory: Clear bilaterally, no added sounds, Port-A-Cath+  Abdomen: Soft, nondistended, no epigastric tenderness, PEG tube in place, bowel sounds present,  Musculoskeletal: Warm, no edema  CNS: Alert and Oriented   Data Reviewed: Basic Metabolic Panel:  Recent Labs Lab 04/24/15 0415 04/25/15 1350 04/26/15 0638 04/27/15 0527 04/28/15 0607  NA 139 145 146* 143 144  K 3.8 3.2* 3.3* 3.6 3.9  CL 111 115* 116* 113* 110  CO2 21* 19* '22 22 24  '$ GLUCOSE 94 90 166* 153* 107*  BUN 31* 26* 23* 20 19  CREATININE 1.17 0.95 0.89 0.82 0.61  CALCIUM 8.2* 9.0 8.8* 8.9 9.4  MG 1.4*  --   --   --   --   PHOS 3.1  --   --   --   --    Liver Function Tests:  Recent Labs Lab 04/24/15 0415 04/25/15 1350 04/26/15 0638 04/27/15 0527 04/28/15 0607  AST 142* 63* 84* 161* 150*  ALT 296* 197* 182* 253* 261*  ALKPHOS 354* 343* 381* 433* 445*  BILITOT 4.5* 5.3* 5.3* 5.9* 7.8*  PROT 5.1* 5.4* 5.5* 5.7* 6.0*  ALBUMIN 2.5* 2.6* 2.5* 2.7* 2.7*    Recent Labs Lab 04/27/15 1220  LIPASE 22   No results for input(s): AMMONIA in the last 168 hours. CBC:  Recent Labs Lab 04/23/15 1903 04/24/15 0415 04/26/15 0638  WBC 12.7* 16.5* 6.9  NEUTROABS 11.7*  --   --   HGB 11.4* 10.2* 10.2*  HCT 34.1* 30.1* 30.1*  MCV 103.6* 103.1* 102.4*  PLT 150 137* 150   Cardiac Enzymes:  Recent Labs Lab 04/24/15 0203 04/24/15 0835 04/24/15 1439  04/24/15 2112 04/25/15 0225  TROPONINI 0.10* 0.21* 0.20* 0.14* 0.13*   BNP (last 3 results) No results for input(s): BNP in the last 8760 hours.  ProBNP (last 3 results) No results for input(s): PROBNP in the last 8760 hours.  CBG: No results for input(s): GLUCAP in the last 168 hours.  Recent Results (from the past 240 hour(s))  Culture, blood (routine x 2)     Status: None (Preliminary result)   Collection Time: 04/23/15  7:03 PM  Result Value Ref Range Status   Specimen Description BLOOD LEFT HAND  Final   Special Requests BOTTLES DRAWN AEROBIC AND ANAEROBIC 5ML  Final   Culture  Setup Time   Final    GRAM NEGATIVE RODS IN BOTH AEROBIC AND ANAEROBIC BOTTLES CRITICAL RESULT CALLED TO, READ BACK BY AND VERIFIED WITH: A. Koontz 11:00 04/24/15 ([email or web address])    Culture   Final    KLEBSIELLA SPECIES SUSCEPTIBILITIES PERFORMED ON PREVIOUS CULTURE WITHIN THE LAST 5 DAYS. STENOTROPHOMONAS MALTOPHILIA SUSCEPTIBILITIES TO FOLLOW Performed at [facility]    Report Status PENDING  Incomplete  Culture, blood (routine x 2)  Status: None   Collection Time: 04/23/15  8:55 PM  Result Value Ref Range Status   Specimen Description BLOOD RIGHT CHEST PORTA CATH  Final   Special Requests BOTTLES DRAWN AEROBIC AND ANAEROBIC 5CC  Final   Culture  Setup Time   Final    GRAM NEGATIVE RODS IN BOTH AEROBIC AND ANAEROBIC BOTTLES CRITICAL RESULT CALLED TO, READ BACK BY AND VERIFIED WITH: Velta Addison RN 11:00 04/24/15 ([email or web address])    Culture   Final    KLEBSIELLA SPECIES ENTEROBACTER CLOACAE Performed at [facility]    Report Status 04/27/2015 FINAL  Final   Organism ID, Bacteria KLEBSIELLA SPECIES  Final   Organism ID, Bacteria ENTEROBACTER CLOACAE  Final      Susceptibility   Enterobacter cloacae - MIC*    CEFAZOLIN >=64 RESISTANT Resistant     CEFEPIME <=1 SENSITIVE Sensitive     CEFTAZIDIME <=1 SENSITIVE Sensitive     CEFTRIAXONE <=1 SENSITIVE Sensitive     CIPROFLOXACIN  <=0.25 SENSITIVE Sensitive     GENTAMICIN <=1 SENSITIVE Sensitive     IMIPENEM <=0.25 SENSITIVE Sensitive     TRIMETH/SULFA <=20 SENSITIVE Sensitive     PIP/TAZO <=4 SENSITIVE Sensitive     * ENTEROBACTER CLOACAE   Klebsiella species - MIC*    AMPICILLIN >=32 RESISTANT Resistant     CEFAZOLIN <=4 SENSITIVE Sensitive     CEFEPIME <=1 SENSITIVE Sensitive     CEFTAZIDIME <=1 SENSITIVE Sensitive     CEFTRIAXONE <=1 SENSITIVE Sensitive     CIPROFLOXACIN <=0.25 SENSITIVE Sensitive     GENTAMICIN <=1 SENSITIVE Sensitive     IMIPENEM <=0.25 SENSITIVE Sensitive     TRIMETH/SULFA <=20 SENSITIVE Sensitive     AMPICILLIN/SULBACTAM 4 SENSITIVE Sensitive     PIP/TAZO <=4 SENSITIVE Sensitive     * KLEBSIELLA SPECIES  Urine culture     Status: None   Collection Time: 04/23/15  9:00 PM  Result Value Ref Range Status   Specimen Description URINE, CATHETERIZED  Final   Special Requests NONE  Final   Culture   Final    NO GROWTH 2 DAYS Performed at [facility]    Report Status 04/25/2015 FINAL  Final  MRSA PCR Screening     Status: None   Collection Time: 04/23/15 10:35 PM  Result Value Ref Range Status   MRSA by PCR NEGATIVE NEGATIVE Final    Comment:        The GeneXpert MRSA Assay (FDA approved for NASAL specimens only), is one component of a comprehensive MRSA colonization surveillance program. It is not intended to diagnose MRSA infection nor to guide or monitor treatment for MRSA infections.   Culture, blood (routine x 2)     Status: None (Preliminary result)   Collection Time: 04/25/15  4:30 PM  Result Value Ref Range Status   Specimen Description BLOOD RIGHT ARM  Final   Special Requests BOTTLES DRAWN AEROBIC AND ANAEROBIC  10CC  Final   Culture   Final    NO GROWTH 2 DAYS Performed at [facility]    Report Status PENDING  Incomplete  Culture, blood (routine x 2)     Status: None (Preliminary result)   Collection Time: 04/25/15  4:40 PM  Result Value Ref  Range Status   Specimen Description BLOOD RIGHT HAND  Final   Special Requests BOTTLES DRAWN AEROBIC AND ANAEROBIC 4CC,5CC  Final   Culture   Final    NO GROWTH 2 DAYS Performed at [facility]    Report Status PENDING  Incomplete     Studies: US Abdomen Complete  04/28/2015  CLINICAL DATA:  Abdominal pain. EXAM: ABDOMEN ULTRASOUND COMPLETE COMPARISON:  CT of 04/23/2015. FINDINGS: Gallbladder: No gallstones or wall thickening visualized. No sonographic Murphy sign noted by sonographer. Common bile duct: Diameter: Normal, 5 mm. Common duct stent not visualized. Liver: No focal lesion.  Pneumobilia incidentally noted. IVC: No abnormality visualized. Pancreas: Poorly visualized due to overlying bowel gas. Spleen: Size and appearance within normal limits. Right Kidney: Length: 10.9 cm. Echogenicity within normal limits. No mass or hydronephrosis visualized. Left Kidney: Length: 11.5 cm. Interpolar left renal cyst. No hydronephrosis. Abdominal aorta: No aneurysm visualized. Other findings: Left-sided pleural effusion incidentally noted. IMPRESSION: 1.  No acute process or explanation for abdominal pain. 2. No biliary duct dilatation. Pneumobilia which is likely secondary to the biliary stent. 3. Small left pleural effusion. Electronically Signed   By: Abigail Miyamoto M.D.   On: 04/28/2015 11:57    Scheduled Meds: . antiseptic oral rinse  7 mL Mouth Rinse q12n4p  . cefTRIAXone (ROCEPHIN)  IV  2 g Intravenous Q24H  . chlorhexidine  15 mL Mouth Rinse BID  . free water  300 mL Per Tube 6 times per day  . heparin  5,000 Units Subcutaneous 3 times per day  . pantoprazole (PROTONIX) IV  40 mg Intravenous QHS   Continuous Infusions: . feeding supplement (OSMOLITE 1.5 CAL) 1,000 mL (04/28/15 1021)      Time spent: 25 minutes    Sircharles Holzheimer, [place]  [facility] Pager [phone]. If 7PM-7AM, please contact night-coverage at [email or web address], password [facility] 04/28/2015, 1:55 PM  LOS: 5 days

## 2015-04-28 NOTE — Progress Notes (Signed)
Physical Therapy Treatment Patient Details Name: [name] MRN: [id number] DOB: Aug 12, 1945 Today's Date: 05/25/2015    History of Present Illness 70 yo male admitted with septic shock. Hx of metastatic lung cancer, vertgio, HTN, PEG    PT Comments    Pt wished to focus on improving mobility today, declined challenges to balance.  Follow Up Recommendations  Outpatient PT;Supervision for mobility/OOB (resume neurorehab outpatient)     Equipment Recommendations  None recommended by PT    Recommendations for Other Services       Precautions / Restrictions Precautions Precautions: Fall    Mobility  Bed Mobility Overal bed mobility: Needs Assistance Bed Mobility: Supine to Sit;Sit to Supine     Supine to sit: Supervision Sit to supine: Supervision   General bed mobility comments: for lines  Transfers Overall transfer level: Needs assistance Equipment used: Rolling walker (2 wheeled) Transfers: Sit to/from Stand Sit to Stand: Supervision         General transfer comment: for safety. verbal cues for hand placement  Ambulation/Gait Ambulation/Gait assistance: Min guard Ambulation Distance (Feet): 260 Feet Assistive device: Rolling walker (2 wheeled) Gait Pattern/deviations: Step-through pattern;Decreased stride length     General Gait Details: Pt c/o lightheadedness. 3 brief standing rest break required. denies SOB, SpO2 100% room air upon returning to room   Stairs            Wheelchair Mobility    Modified Rankin (Stroke Patients Only)       Balance                                    Cognition Arousal/Alertness: Awake/alert Behavior During Therapy: WFL for tasks assessed/performed Overall Cognitive Status: Within Functional Limits for tasks assessed                      Exercises      General Comments        Pertinent Vitals/Pain Pain Assessment: No/denies pain    Home Living                       Prior Function            PT Goals (current goals can now be found in the care plan section) Progress towards PT goals: Progressing toward goals    Frequency  Min 3X/week    PT Plan Current plan remains appropriate    Co-evaluation             End of Session   Activity Tolerance: Patient tolerated treatment well Patient left: in bed;with call bell/phone within reach;with bed alarm set     Time: 8144-8185 PT Time Calculation (min) (ACUTE ONLY): 15 min  Charges:  $Gait Training: 8-22 mins                    G Codes:      Elric Tirado,KATHrine E May 25, 2015, 1:01 PM Carmelia Bake, PT, DPT May 25, 2015 Pager: [phone]

## 2015-04-28 NOTE — Progress Notes (Signed)
Pt plan to discharge home with [facility], [phone].

## 2015-04-29 DIAGNOSIS — C3490 Malignant neoplasm of unspecified part of unspecified bronchus or lung: Secondary | ICD-10-CM

## 2015-04-29 DIAGNOSIS — N179 Acute kidney failure, unspecified: Secondary | ICD-10-CM

## 2015-04-29 DIAGNOSIS — E876 Hypokalemia: Secondary | ICD-10-CM | POA: Diagnosis not present

## 2015-04-29 DIAGNOSIS — A498 Other bacterial infections of unspecified site: Secondary | ICD-10-CM

## 2015-04-29 DIAGNOSIS — R7881 Bacteremia: Secondary | ICD-10-CM | POA: Diagnosis present

## 2015-04-29 LAB — COMPREHENSIVE METABOLIC PANEL
ALT: 203 U/L — ABNORMAL HIGH (ref 17–63)
ANION GAP: 9 (ref 5–15)
AST: 108 U/L — ABNORMAL HIGH (ref 15–41)
Albumin: 2.5 g/dL — ABNORMAL LOW (ref 3.5–5.0)
Alkaline Phosphatase: 446 U/L — ABNORMAL HIGH (ref 38–126)
BUN: 23 mg/dL — ABNORMAL HIGH (ref 6–20)
CHLORIDE: 108 mmol/L (ref 101–111)
CO2: 24 mmol/L (ref 22–32)
Calcium: 9.1 mg/dL (ref 8.9–10.3)
Creatinine, Ser: 0.64 mg/dL (ref 0.61–1.24)
Glucose, Bld: 133 mg/dL — ABNORMAL HIGH (ref 65–99)
POTASSIUM: 3.4 mmol/L — AB (ref 3.5–5.1)
SODIUM: 141 mmol/L (ref 135–145)
Total Bilirubin: 7.7 mg/dL — ABNORMAL HIGH (ref 0.3–1.2)
Total Protein: 5.7 g/dL — ABNORMAL LOW (ref 6.5–8.1)

## 2015-04-29 LAB — CULTURE, BLOOD (ROUTINE X 2)

## 2015-04-29 LAB — HEPATITIS PANEL, ACUTE
HEP A IGM: NEGATIVE
HEP B C IGM: POSITIVE — AB
HEP B S AG: NEGATIVE

## 2015-04-29 MED ORDER — CEFTRIAXONE SODIUM 2 G IJ SOLR: 2.0000 g | INTRAMUSCULAR | Status: AC

## 2015-04-29 MED ORDER — HEPARIN SOD (PORK) LOCK FLUSH 100 UNIT/ML IV SOLN
500.0000 [IU] | INTRAVENOUS | Status: DC
  Filled 2015-04-29: qty 5

## 2015-04-29 MED ORDER — METOCLOPRAMIDE HCL 10 MG PO TABS: 10.0000 mg | ORAL_TABLET | Freq: Two times a day (BID) | ORAL | Status: DC

## 2015-04-29 MED ORDER — OSMOLITE 1.5 CAL PO LIQD: ORAL | Status: DC

## 2015-04-29 MED ORDER — POTASSIUM CHLORIDE 20 MEQ PO PACK: 40.0000 meq | PACK | Freq: Once | ORAL | Status: DC

## 2015-04-29 MED ORDER — HEPARIN SOD (PORK) LOCK FLUSH 100 UNIT/ML IV SOLN
500.0000 [IU] | INTRAVENOUS | Status: DC | PRN
  Administered 2015-04-29: 500 [IU]
  Filled 2015-04-29: qty 5

## 2015-04-29 MED ORDER — OSMOLITE 1.5 CAL PO LIQD
1000.0000 mL | ORAL | Status: DC
  Administered 2015-04-29: 1000 mL via JEJUNOSTOMY
  Filled 2015-04-29: qty 1000

## 2015-04-29 MED ORDER — POTASSIUM CHLORIDE 20 MEQ/15ML (10%) PO SOLN
40.0000 meq | Freq: Once | ORAL | Status: AC
  Administered 2015-04-29: 40 meq via ORAL
  Filled 2015-04-29: qty 30

## 2015-04-29 NOTE — Progress Notes (Signed)
Nutrition Follow-up  DOCUMENTATION CODES:   Severe malnutrition in context of chronic illness  INTERVENTION:  - Recommend change in TF regimen: increase Osmolite 1.5 to 55 mL/hr which will provide 1980 kcal, 83 grams of protein, and 1006 mL free water - Recommend decrease free water flush to 200 mL free water every 4 hours (1200 mL/day) - RD will continue to monitor for needs  NUTRITION DIAGNOSIS:   Malnutrition related to chronic illness as evidenced by severe depletion of muscle mass, severe depletion of body fat. -ongoing  GOAL:   Patient will meet greater than or equal to 90% of their needs -unmet currently  MONITOR:   Labs, I & O's, Skin, Diet advancement, Supplement acceptance  ASSESSMENT:   [name] is a 70 y.o. male with PMH as outlined below including metastatic NSCLC initially diagnosed Sept 2010 (adeno - sees Dr. [name]). He is s/p chemo and XRT and was on maintenance chemo and immunotherapy up until what appears to be Sept 2016  1/31 Pt with PEG, currently receiving Osmolite 1.5 @ 40 mL/hr with 300 mL free water every 4 hours. This regimen is providing 1440 kcal (74% minimum estimated kcal needs), 60 grams of protein (86% minimum estimated protein needs), and 2531 mL free water. Pt denies abdominal pain but states he does feel some nausea with sips of water and when PEG is flushed with water during cleaning of this area. Pt is able to take sips of fluids but is unable to consume anything else PO.  TF recommendations outlined above as pt not currently meeting needs. Medications reviewed. Labs reviewed; K: 3.4 mmol/L, BUN: 23 mg/dL, LFTs elevated with AST and ALT trending down.    1/29 - RD contacted by RN regarding free water flushes. - IVF have been stopped. - RN reports dark colored urine. - Pt was initially assessed 1/26. - RD has not been consulted for TF management. - Patient receiving Osmolite 1.5 @ 40 ml/hr which provides 1440 kcal, 60g protein and  732 ml of H20. - Free water flush recommendations:   200 ml every 4 hours (1200 ml daily)   Free water + TF formula will provide 2297 ml H20  1/26 - Pt was on hospice/palliative care at home, but has been improving lately, was being considered for discharge until this acute incident.  - Pt does not take anything PO, 100% nutrition through PEG.  - Per patient, he does 3 cans of Osmolite 1.5 and 1 Can TwoCal HN at home.  - Will follow same regimen here.  - Pt did not want to switch to more cans of Osmolite at this time. - Per RN, pt has gram negative rod bacterial infection. - Nutrition-Focused physical exam completed.  - Findings are no fat depletion, no muscle depletion, and no edema.   Diet Order:  Diet NPO time specified  Skin:  Reviewed, no issues  Last BM:  1/30  Height:   Ht Readings from Last 1 Encounters:  04/23/15 '5\' 8"'$  (1.727 m)    Weight:   Wt Readings from Last 1 Encounters:  04/29/15 114 lb 3.2 oz (51.8 kg)    Ideal Body Weight:  70 kg  BMI:  Body mass index is 17.37 kg/(m^2).  Estimated Nutritional Needs:   Kcal:  4098-1191  Protein:  70-85 grams  Fluid:  >/= 2.5L  EDUCATION NEEDS:   No education needs identified at this time    Jarome Matin, RD, LDN Inpatient Clinical Dietitian Pager # [phone] After hours/weekend pager #  [phone]  

## 2015-04-29 NOTE — Progress Notes (Addendum)
Spoke with pt's wife concerning Home Health. She selected [facility] for [facility] needs. Pt will not go home with [facility]. Pt and wife signed off with [facility] on 04/28/15.  Referral given to [facility].

## 2015-04-29 NOTE — Discharge Summary (Signed)
Physician Discharge Summary  Nunzio Banet YCX:[id number] DOB: 08-10-1945 DOA: 04/23/2015  PCP: [name], MD  Admit date: 04/23/2015 Discharge date: 04/29/2015  Time spent:35 minutes  Recommendations for Outpatient Follow-up:  1. Discharge home with home health. Patient will complete total 2 weeks course of antibiotics (Rocephin) on 05/07/2015. 2. Patient should follow-up with his PCP in 1 week. Please follow liver enzymes during outpatient visit. Resume outpatient PT in one week.   Discharge Diagnoses:  Principal Problem:   Septic shock ([place])   Active Problems:   Bronchogenic cancer of left lung (HCC)   Dizziness and giddiness   Transaminitis   Protein-calorie malnutrition, severe (HCC)   Acute kidney injury ([place])   Gram-negative bacteremia (HCC)   Bacteremia due to Klebsiella pneumoniae   Infection due to Enterobacter cloacae   Hypokalemia   Discharge Condition: Fair  Diet recommendation: tube feeding (Osmolite 1.5 with 2.0, currently at 40 mL/h titrated to goal of 65 mL/h along with free water)  CODE STATUS: DO NOT RESUSCITATE  Filed Weights   04/27/15 0547 04/28/15 0612 04/29/15 0505  Weight: 52.4 kg (115 lb 8.3 oz) 51.7 kg (113 lb 15.7 oz) 51.8 kg (114 lb 3.2 oz)    History of present illness:  Please refer  to admission H&P for details, in brief,70 year old male with history of metastatic non-small cell lung cancer (adenocarcinoma) diagnosed in September 2010 status post chemotherapy and radiation and was on maintenance chemoimmunotherapy on September 2016 and was referred to home hospice given disease progression. However patient has been doing well on hospice. He has a PEG tube placed and receives full nutrition fluid. 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 On 1/25 he was brought to the ED by EMS for fever 102F, weakness, abdominal pain for one week associated with nausea. When EMS evaluated the patient he was  hypotensive to 60/40 mmHg. In the ED remained hypotensive with systolic blood pressure in the mid 70s, workup showed a wbc of 12.7 K, lactate of 4.15, acute kidney injury with creatinine of 1.39 and transaminitis. Patient admitted to ICU under critical care service for septic shock and started on Levophed as family requested for pressors. Patient found to have blood cultures positive for Klebsiella and Enterobacter. Also has significant transaminitis.  Hospital Course:  Septic shock . Blood cultures on admission 2 growing Klebsiella and Enterobacter both sensitive to Rocephin. Antibiotic changed to IV Rocephin and will plan on 2 weeks course upon discharge. Can be given through his Port-A-Cath... urine culture unremarkable.  - 2-D echo negative for any vegetations. -Sepsis resolved. Off pressors. Blood pressure normal. -tube Feeds resumed at a lower rate given nausea and epigastric pain. .  Metastatic non-small cell lung cancer (adenocarcinoma) Followed by Dr. [name] and had received chemotherapy and radiation which was stopped in September 2016 due to disease progression and was referred to home hospice. Patient currently being followed by hospice. reports that he has done quite well and is interested in following up with Dr. [name] again. Patient and his wife revoked home hospice and would like to follow-up with home health and with their primary care physician.  Abdominal pain with progressive transaminitis Possibly due to septic shock. However patient has worsened transaminitis and elevated bilirubin. Also had epigastric pain with nausea or which his tube feeds were held. 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  abdominal ultrasound unremarkable as well..  -  Lipase normal.   Spoke with Dr. Benson Norway had attempted EUS back in May 2016 and informs me that it was usuccessful procedure since it was extremely  difficult to placing a stent. He had a percutaneous stent placed by IR which was then internalized. -Continue Reglan for nausea with improvement. Resumed 2 feeds at a lower rate and tolerating well. -MRCP was done on 1/31 which showed intact stent within common bile duct with minimal intrahepatic biliary dilatation stable compared to recent CT. Stent patency could not be visualized due to artifact. Unchanged retroperitoneal lymphadenopathy posterior to IVC. No mass was visualized. Since his symptoms are better and LFTs slightly improved this morning I will discharge him home with outpatient follow-up.   Acute kidney injury Prerenal secondary to dehydration and septic shock. Improved with hydration.   Hypokalemia Replenished  Severe protein calorie malnutrition Tube feeds resumed at 40 mL per hour. (Goal of 65 mL/h). Continue free water.  Hypomagnesemia Replenished  vertigo Getting outpatient vestibular rehabilitation. Can resume therapy next week.    Family Communication: Wife at bedside Disposition Plan: Home once abdominal pain and transaminitis improved.  Consultants:  PC CM  Procedures:  2-D echo  CT abdomen and pelvis  Ultrasound abdomen  MRCP  Antibiotics:  Vancomycin1/25-1/27  Aztreonam 1/25-1/29  Rocephin 1/29--until 05/07/2015    Discharge Exam: Filed Vitals:   04/28/15 2110 04/29/15 0505  BP: 96/66 110/72  Pulse: 93 85  Temp: 98.1 F (36.7 C) 98.1 F (36.7 C)  Resp: 12 18      General: appears fatigued  HEENT: , moist mucosa  Cardiovascular: Normal S1 and S2, no murmurs  Respiratory: Clear bilaterally, no added sounds, Port-A-Cath+  Abdomen: Soft, nondistended, no epigastric tenderness, PEG tube in place, bowel sounds present,  Musculoskeletal: Warm, no edema  CNS: Alert and Oriented  Discharge Instructions    Current Discharge Medication List    START taking these medications   Details  cefTRIAXone 2 g in dextrose 5 %  50 mL Inject 2 g into the vein daily. Qty: 8 Dose, Refills: 0      CONTINUE these medications which have CHANGED   Details  metoCLOPramide (REGLAN) 10 MG tablet Take 1 tablet (10 mg total) by mouth 2 (two) times daily. Qty: 45 tablet, Refills: 0   Associated Diagnoses: Bronchogenic cancer of left lung (HCC)    Nutritional Supplements (FEEDING SUPPLEMENT, OSMOLITE 1.5 CAL,) LIQD Begin Osmolite 1.5 with 2.0 at 40 cc/ hr continuosly current at 40 cc/ hr for now and gradually advance to 65 cc/ hr which is current goal via PEG tube. ( may take a week or wo to reach his goal)   Flush feeding tube with 240 cc free water 6 times daily. Qty: 1422 mL, Refills: 0   Associated Diagnoses: Malnutrition of moderate degree ([place])      CONTINUE these medications which have NOT CHANGED   Details  b complex vitamins tablet Take 1 tablet by mouth daily.    diphenhydramine-acetaminophen (TYLENOL PM) 25-500 MG TABS tablet Take 1 tablet by mouth at bedtime as needed (sleep.).    influenza vac split quadrivalent PF (FLUARIX) 0.5 ML injection Inject 0.5 mLs into the muscle once. Qty: 0.5 mL, Refills: 0    LORazepam (ATIVAN) 1 MG tablet Take 1 mg by mouth at bedtime.    midodrine (PROAMATINE) 5 MG tablet Take 5 mg by mouth 3 (three) times daily with meals.    Multiple Vitamin (MULTIVITAMIN) tablet Take 1 tablet by mouth every morning.  omeprazole (PRILOSEC) 40 MG capsule Take 1 capsule (40 mg total) by mouth every evening. Qty: 90 capsule, Refills: 3   Associated Diagnoses: Malignant neoplasm of lower lobe of lung, unspecified laterality ([place]); Malignant neoplasm of upper lobe of lung, unspecified laterality (HCC)    ondansetron (ZOFRAN) 8 MG tablet Take 1 tablet (8 mg total) by mouth every 8 (eight) hours as needed for nausea or vomiting. Qty: 30 tablet, Refills: 2   Associated Diagnoses: Squamous carcinoma of lung, unspecified laterality (HCC)    prochlorperazine (COMPAZINE) 10 MG tablet Take 1  tablet (10 mg total) by mouth every 6 (six) hours as needed for nausea or vomiting. Qty: 30 tablet, Refills: 1   Associated Diagnoses: Squamous carcinoma of lung, unspecified laterality (HCC)    sennosides-docusate sodium (SENOKOT-S) 8.6-50 MG tablet Take 1 tablet by mouth daily as needed for constipation.    triamcinolone (NASACORT ALLERGY 24HR) 55 MCG/ACT AERO nasal inhaler Place 1 spray into the nose daily as needed (congestion.).      STOP taking these medications     topiramate (TOPAMAX) 25 MG tablet        Allergies  Allergen Reactions  . Augmentin [Amoxicillin-Pot Clavulanate] Diarrhea  . Carboplatin     Rash and itching after carbo test dose  . Levaquin [Levofloxacin] Other (See Comments)    Knee pain  . Percocet [Oxycodone-Acetaminophen] Nausea And Vomiting  . Scopolamine     hallucinations  . Zolpidem Tartrate Diarrhea   Follow-up Information    Follow up with [name], MD In 1 week.   Specialty:  Family Medicine   Contact information:   [place] [address] [phone]        The results of significant diagnostics from this hospitalization (including imaging, microbiology, ancillary and laboratory) are listed below for reference.    Significant Diagnostic Studies: US Abdomen Complete  04/28/2015  CLINICAL DATA:  Abdominal pain. EXAM: ABDOMEN ULTRASOUND COMPLETE COMPARISON:  CT of 04/23/2015. FINDINGS: Gallbladder: No gallstones or wall thickening visualized. No sonographic Murphy sign noted by sonographer. Common bile duct: Diameter: Normal, 5 mm. Common duct stent not visualized. Liver: No focal lesion.  Pneumobilia incidentally noted. IVC: No abnormality visualized. Pancreas: Poorly visualized due to overlying bowel gas. Spleen: Size and appearance within normal limits. Right Kidney: Length: 10.9 cm. Echogenicity within normal limits. No mass or hydronephrosis visualized. Left Kidney: Length: 11.5 cm. Interpolar left renal cyst.  No hydronephrosis. Abdominal aorta: No aneurysm visualized. Other findings: Left-sided pleural effusion incidentally noted. IMPRESSION: 1.  No acute process or explanation for abdominal pain. 2. No biliary duct dilatation. Pneumobilia which is likely secondary to the biliary stent. 3. Small left pleural effusion. Electronically Signed   By: Abigail Miyamoto M.D.   On: 04/28/2015 11:57   Ct Abdomen Pelvis W Contrast  04/23/2015  CLINICAL DATA:  Fever, weakness, stomach pain, and nausea for 1 day. Hypotension this afternoon. Non-small-cell lung cancer diagnosed September 2010. No active treatment. EXAM: CT ABDOMEN AND PELVIS WITH CONTRAST TECHNIQUE: Multidetector CT imaging of the abdomen and pelvis was performed using the standard protocol following bolus administration of intravenous contrast. CONTRAST:  81m OMNIPAQUE IOHEXOL 300 MG/ML SOLN, 1078mOMNIPAQUE IOHEXOL 300 MG/ML SOLN COMPARISON:  09/21/2014 FINDINGS: Small bilateral pleural effusions with basilar atelectasis. Cystic changes in the lung bases may represent honeycomb change or emphysema. Coronary artery calcifications. Biliary stent in place. Mild bile duct dilatation with pneumobilia. Enlarged lymph node adjacent to the pancreatic head seen previously has  significantly decreased in size. No significant residual tumor identified. No pancreatic ductal dilatation and pancreatic parenchyma appears normal. The gallbladder, spleen, adrenal glands, are unremarkable. There is an enlarging posterior paracaval lymph node which extends into the right psoas muscle. This lymph node was present previously but is enlarging common now measuring 3.6 cm in diameter. Additional scattered retroperitoneal lymph nodes are not significantly enlarged. Cyst in the left kidney. No hydronephrosis in either kidney. Percutaneous gastrostomy tube. Stomach and small bowel are decompressed. Contrast material flows through to the rectum suggesting no evidence of bowel or colonic  obstruction. No free air in the abdomen. There is some infiltration or edema in the right upper quadrant fat. Diffuse calcification and atherosclerotic change in the abdominal aorta without aneurysm. Pelvis: Bladder is decompressed with a Foley catheter. Gas in the bladder consistent with catheterization. Diffuse bladder wall thickening likely indicate infection. No free or loculated pelvic fluid collections. No pelvic mass or lymphadenopathy. Prostate gland is mildly enlarged, measuring 4.9 cm transverse diameter. Vascular calcifications in the iliac and femoral arteries. No destructive bone lesions. Degenerative changes in the spine and hips. IMPRESSION: Small bilateral pleural effusions with basilar atelectasis. Enlarging posterior retroperitoneal paracaval lymph node. Previous enlarged lymph node near the head of the pancreas has decreased since prior study. Biliary stent is in place. Prostate gland is enlarged. Bladder wall is thickened, suggesting cystitis. Electronically Signed   By: Lucienne Capers M.D.   On: 04/23/2015 23:44   Mr Abdomen Mrcp Wo Cm  04/29/2015  CLINICAL DATA:  Abdominal and epigastric pain. Elevated liver function tests. Nausea. Status post internal biliary stent placement for metastatic lung carcinoma. EXAM: MRI ABDOMEN WITHOUT CONTRAST  (INCLUDING MRCP) TECHNIQUE: Multiplanar multisequence MR imaging of the abdomen was performed. Heavily T2-weighted images of the biliary and pancreatic ducts were obtained, and three-dimensional MRCP images were rendered by post processing. COMPARISON:  CT on 04/23/2015 FINDINGS: Lower chest:  Tiny bilateral pleural effusions again noted. Hepatobiliary: No mass visualized on this unenhanced exam. Internal biliary stent is seen within the common bile duct, and there is minimal dilatation of intrahepatic bile ducts. Stent patency cannot be evaluated due to artifact. Pancreas: No mass or inflammatory process visualized on this unenhanced exam. No evidence  of pancreatic ductal dilatation. Spleen:  Within normal limits in size. Adrenals/Urinary Tract: No adrenal mass identified. No evidence of renal mass or hydronephrosis. Stomach/Bowel: Percutaneous gastrostomy tube artifact again seen. No evidence of dilated bowel loops within the abdomen. Vascular/Lymphatic: Retroperitoneal lymphadenopathy is seen posterior to the IVC which measures 3.7 x 2.7 cm on image 40/ series 3, without significant change compared to previous study. No evidence of abdominal aortic aneurysm. Other:  None. Musculoskeletal:  No suspicious bone lesions identified. IMPRESSION: Internal stent within the common bile duct. Although stent patency cannot be evaluated due to artifact, there is minimal dilatation of intrahepatic bile ducts which appears stable compared to recent CT. Retroperitoneal lymphadenopathy posterior to the IVC, without significant change. Tiny bilateral pleural effusions, also stable. Electronically Signed   By: Earle Gell M.D.   On: 04/29/2015 08:15   Mr 3d Recon At Scanner  04/29/2015  CLINICAL DATA:  Abdominal and epigastric pain. Elevated liver function tests. Nausea. Status post internal biliary stent placement for metastatic lung carcinoma. EXAM: MRI ABDOMEN WITHOUT CONTRAST  (INCLUDING MRCP) TECHNIQUE: Multiplanar multisequence MR imaging of the abdomen was performed. Heavily T2-weighted images of the biliary and pancreatic ducts were obtained, and three-dimensional MRCP images were rendered by post processing. COMPARISON:  CT on 04/23/2015 FINDINGS: Lower chest:  Tiny bilateral pleural effusions again noted. Hepatobiliary: No mass visualized on this unenhanced exam. Internal biliary stent is seen within the common bile duct, and there is minimal dilatation of intrahepatic bile ducts. Stent patency cannot be evaluated due to artifact. Pancreas: No mass or inflammatory process visualized on this unenhanced exam. No evidence of pancreatic ductal dilatation. Spleen:   Within normal limits in size. Adrenals/Urinary Tract: No adrenal mass identified. No evidence of renal mass or hydronephrosis. Stomach/Bowel: Percutaneous gastrostomy tube artifact again seen. No evidence of dilated bowel loops within the abdomen. Vascular/Lymphatic: Retroperitoneal lymphadenopathy is seen posterior to the IVC which measures 3.7 x 2.7 cm on image 40/ series 3, without significant change compared to previous study. No evidence of abdominal aortic aneurysm. Other:  None. Musculoskeletal:  No suspicious bone lesions identified. IMPRESSION: Internal stent within the common bile duct. Although stent patency cannot be evaluated due to artifact, there is minimal dilatation of intrahepatic bile ducts which appears stable compared to recent CT. Retroperitoneal lymphadenopathy posterior to the IVC, without significant change. Tiny bilateral pleural effusions, also stable. Electronically Signed   By: Earle Gell M.D.   On: 04/29/2015 08:15   Dg Chest Port 1 View  04/23/2015  CLINICAL DATA:  Fever, weakness and nausea for 1 day. Hypotension today. Lung cancer patient. Insert image EXAM: PORTABLE CHEST 1 VIEW COMPARISON:  CT chest 01/02/2015.  PA and lateral chest 12/24/2014. FINDINGS: Port-A-Cath is in place, unchanged. The lungs are clear. Mediastinal lymphadenopathy appears improved. Heart size is normal. No pneumothorax or pleural effusion. IMPRESSION: No acute disease.  Mediastinal lymphadenopathy appears improved. Electronically Signed   By: Inge Rise M.D.   On: 04/23/2015 19:39    Microbiology: Recent Results (from the past 240 hour(s))  Culture, blood (routine x 2)     Status: None   Collection Time: 04/23/15  7:03 PM  Result Value Ref Range Status   Specimen Description BLOOD LEFT HAND  Final   Special Requests BOTTLES DRAWN AEROBIC AND ANAEROBIC 5ML  Final   Culture  Setup Time   Final    GRAM NEGATIVE RODS IN BOTH AEROBIC AND ANAEROBIC BOTTLES CRITICAL RESULT CALLED TO, READ  BACK BY AND VERIFIED WITH: A. Koontz 11:00 04/24/15 ([email or web address])    Culture   Final    KLEBSIELLA SPECIES SUSCEPTIBILITIES PERFORMED ON PREVIOUS CULTURE WITHIN THE LAST 5 DAYS. STENOTROPHOMONAS MALTOPHILIA Performed at [facility]    Report Status 04/29/2015 FINAL  Final   Organism ID, Bacteria STENOTROPHOMONAS MALTOPHILIA  Final      Susceptibility   Stenotrophomonas maltophilia - MIC*    LEVOFLOXACIN 1 SENSITIVE Sensitive     TRIMETH/SULFA <=20 SENSITIVE Sensitive     * STENOTROPHOMONAS MALTOPHILIA  Culture, blood (routine x 2)     Status: None   Collection Time: 04/23/15  8:55 PM  Result Value Ref Range Status   Specimen Description BLOOD RIGHT CHEST PORTA CATH  Final   Special Requests BOTTLES DRAWN AEROBIC AND ANAEROBIC 5CC  Final   Culture  Setup Time   Final    GRAM NEGATIVE RODS IN BOTH AEROBIC AND ANAEROBIC BOTTLES CRITICAL RESULT CALLED TO, READ BACK BY AND VERIFIED WITH: Velta Addison RN 11:00 04/24/15 ([email or web address])    Culture   Final    KLEBSIELLA SPECIES ENTEROBACTER CLOACAE Performed at [facility]    Report Status 04/27/2015 FINAL  Final   Organism ID, Bacteria KLEBSIELLA SPECIES  Final   Organism ID, Bacteria ENTEROBACTER  CLOACAE  Final      Susceptibility   Enterobacter cloacae - MIC*    CEFAZOLIN >=64 RESISTANT Resistant     CEFEPIME <=1 SENSITIVE Sensitive     CEFTAZIDIME <=1 SENSITIVE Sensitive     CEFTRIAXONE <=1 SENSITIVE Sensitive     CIPROFLOXACIN <=0.25 SENSITIVE Sensitive     GENTAMICIN <=1 SENSITIVE Sensitive     IMIPENEM <=0.25 SENSITIVE Sensitive     TRIMETH/SULFA <=20 SENSITIVE Sensitive     PIP/TAZO <=4 SENSITIVE Sensitive     * ENTEROBACTER CLOACAE   Klebsiella species - MIC*    AMPICILLIN >=32 RESISTANT Resistant     CEFAZOLIN <=4 SENSITIVE Sensitive     CEFEPIME <=1 SENSITIVE Sensitive     CEFTAZIDIME <=1 SENSITIVE Sensitive     CEFTRIAXONE <=1 SENSITIVE Sensitive     CIPROFLOXACIN <=0.25 SENSITIVE Sensitive     GENTAMICIN <=1  SENSITIVE Sensitive     IMIPENEM <=0.25 SENSITIVE Sensitive     TRIMETH/SULFA <=20 SENSITIVE Sensitive     AMPICILLIN/SULBACTAM 4 SENSITIVE Sensitive     PIP/TAZO <=4 SENSITIVE Sensitive     * KLEBSIELLA SPECIES  Urine culture     Status: None   Collection Time: 04/23/15  9:00 PM  Result Value Ref Range Status   Specimen Description URINE, CATHETERIZED  Final   Special Requests NONE  Final   Culture   Final    NO GROWTH 2 DAYS Performed at [facility]    Report Status 04/25/2015 FINAL  Final  MRSA PCR Screening     Status: None   Collection Time: 04/23/15 10:35 PM  Result Value Ref Range Status   MRSA by PCR NEGATIVE NEGATIVE Final    Comment:        The GeneXpert MRSA Assay (FDA approved for NASAL specimens only), is one component of a comprehensive MRSA colonization surveillance program. It is not intended to diagnose MRSA infection nor to guide or monitor treatment for MRSA infections.   Culture, blood (routine x 2)     Status: None (Preliminary result)   Collection Time: 04/25/15  4:30 PM  Result Value Ref Range Status   Specimen Description BLOOD RIGHT ARM  Final   Special Requests BOTTLES DRAWN AEROBIC AND ANAEROBIC  10CC  Final   Culture   Final    NO GROWTH 3 DAYS Performed at [facility]    Report Status PENDING  Incomplete  Culture, blood (routine x 2)     Status: None (Preliminary result)   Collection Time: 04/25/15  4:40 PM  Result Value Ref Range Status   Specimen Description BLOOD RIGHT HAND  Final   Special Requests BOTTLES DRAWN AEROBIC AND ANAEROBIC 4CC,5CC  Final   Culture   Final    NO GROWTH 3 DAYS Performed at [facility]    Report Status PENDING  Incomplete     Labs: Basic Metabolic Panel:  Recent Labs Lab 04/24/15 0415 04/25/15 1350 04/26/15 0638 04/27/15 0527 04/28/15 0607 04/29/15 0448  NA 139 145 146* 143 144 141  K 3.8 3.2* 3.3* 3.6 3.9 3.4*  CL 111 115* 116* 113* 110 108  CO2 21* 19* '22 22 24 24   '$ GLUCOSE 94 90 166* 153* 107* 133*  BUN 31* 26* 23* 20 19 23*  CREATININE 1.17 0.95 0.89 0.82 0.61 0.64  CALCIUM 8.2* 9.0 8.8* 8.9 9.4 9.1  MG 1.4*  --   --   --   --   --   PHOS 3.1  --   --   --   --   --  Liver Function Tests:  Recent Labs Lab 04/25/15 1350 04/26/15 0638 04/27/15 0527 04/28/15 0607 04/29/15 0448  AST 63* 84* 161* 150* 108*  ALT 197* 182* 253* 261* 203*  ALKPHOS 343* 381* 433* 445* 446*  BILITOT 5.3* 5.3* 5.9* 7.8* 7.7*  PROT 5.4* 5.5* 5.7* 6.0* 5.7*  ALBUMIN 2.6* 2.5* 2.7* 2.7* 2.5*    Recent Labs Lab 04/27/15 1220  LIPASE 22   No results for input(s): AMMONIA in the last 168 hours. CBC:  Recent Labs Lab 04/23/15 1903 04/24/15 0415 04/26/15 0638  WBC 12.7* 16.5* 6.9  NEUTROABS 11.7*  --   --   HGB 11.4* 10.2* 10.2*  HCT 34.1* 30.1* 30.1*  MCV 103.6* 103.1* 102.4*  PLT 150 137* 150   Cardiac Enzymes:  Recent Labs Lab 04/24/15 0203 04/24/15 0835 04/24/15 1439 04/24/15 2112 04/25/15 0225  TROPONINI 0.10* 0.21* 0.20* 0.14* 0.13*   BNP: BNP (last 3 results) No results for input(s): BNP in the last 8760 hours.  ProBNP (last 3 results) No results for input(s): PROBNP in the last 8760 hours.  CBG: No results for input(s): GLUCAP in the last 168 hours.     Signed:  Louellen Molder MD.  [facility] 04/29/2015, 10:43 AM

## 2015-04-30 LAB — CULTURE, BLOOD (ROUTINE X 2)
Culture: NO GROWTH
Culture: NO GROWTH

## 2015-05-02 ENCOUNTER — Encounter: Payer: Medicare Other | Admitting: Physical Therapy

## 2015-05-05 ENCOUNTER — Telehealth: Payer: Self-pay | Admitting: Medical Oncology

## 2015-05-05 NOTE — Telephone Encounter (Signed)
Recent hospitalization for "blood stream infection and getting IV antibiotics". Requests that Dr [name] review his case and give his impression and to call pt back. Note to [place].

## 2015-05-07 ENCOUNTER — Encounter: Payer: Medicare Other | Admitting: Physical Therapy

## 2015-05-09 ENCOUNTER — Encounter: Payer: Medicare Other | Admitting: Physical Therapy

## 2015-05-13 ENCOUNTER — Telehealth: Payer: Self-pay | Admitting: Medical Oncology

## 2015-05-13 NOTE — Telephone Encounter (Signed)
Spoke to wife to clarify call to [name] last week. She said [name] had sepsis and in finishing home antibiotics tomorrow. Dr Doyle Askew -PCP , recommended pt go back to hospice or see [facility].  Wife asked if [name] can look at scans and labs and let them know if his high bili is related to cancer or could there be another cause. She needs his input . Note to [place].

## 2015-05-14 ENCOUNTER — Telehealth: Payer: Self-pay | Admitting: Medical Oncology

## 2015-05-14 ENCOUNTER — Other Ambulatory Visit: Payer: Self-pay | Admitting: Medical Oncology

## 2015-05-14 DIAGNOSIS — R110 Nausea: Secondary | ICD-10-CM

## 2015-05-14 DIAGNOSIS — C3490 Malignant neoplasm of unspecified part of unspecified bronchus or lung: Secondary | ICD-10-CM

## 2015-05-14 MED ORDER — ONDANSETRON HCL 8 MG PO TABS: 8.0000 mg | ORAL_TABLET | Freq: Three times a day (TID) | ORAL | Status: DC | PRN

## 2015-05-14 NOTE — Telephone Encounter (Signed)
Wife asking if a "pathobiliary scan" ( HIDA scan)to see if gallbladder is functioning-he is having a lot of gas and nausea. Per [name] this needs to go through Dr Collene Mares or Dr Benson Norway. He is happy to talk to pt about further treatment , but  to get the gallbladder work up done pt needs to go through GI

## 2015-05-14 NOTE — Telephone Encounter (Signed)
[name] called and said Dr Doyle Askew looked at recent bilirubin and is deferring further treatment to Dr [name]. [name] asking for [facility] to review. I called her back and told her [name] thinks bili lab results are related to cancer.

## 2015-05-15 ENCOUNTER — Telehealth: Payer: Self-pay | Admitting: Medical Oncology

## 2015-05-15 NOTE — Telephone Encounter (Signed)
I told [name] to contact Dr Collene Mares to request gallbladder scan. I also told her zofran was called in and she said insurance will not pay because they understood pt was under hospice care and is not covered under part b . Pharmacy is going to resubmit it under part d ( pt is not under hospice care now)  to try to get it approved. Wife is submitting an appeal. In the meantime she is giving him ativan adn it seems to be helping with nausea.

## 2015-05-16 ENCOUNTER — Encounter: Payer: Medicare Other | Admitting: Physical Therapy

## 2015-05-16 ENCOUNTER — Other Ambulatory Visit: Payer: Self-pay | Admitting: Gastroenterology

## 2015-05-16 ENCOUNTER — Encounter: Payer: Self-pay | Admitting: Internal Medicine

## 2015-05-19 ENCOUNTER — Telehealth: Payer: Self-pay | Admitting: Medical Oncology

## 2015-05-19 NOTE — Telephone Encounter (Signed)
Pt is no longer under Hospice services. I left a message for RN supervisor to call. Wife reports insurance still has pt under hospice so they will not cover zofran. I called hospice and left message for RN to call to see how we can rectify this so insurance will cover zofran because it works so well for him.

## 2015-05-20 ENCOUNTER — Encounter (HOSPITAL_COMMUNITY): Payer: Self-pay | Admitting: *Deleted

## 2015-05-20 ENCOUNTER — Encounter: Payer: Medicare Other | Admitting: Physical Therapy

## 2015-05-20 NOTE — Progress Notes (Signed)
[name] has a tube feeding at 50 ml per hour, water at 45 ml per hour.  [name] was not able to tolerate the goal rate of 65 ml of tube feeding per hour.   Tube feeding and water will be stopped at midnight, Thursday.  Patient's wife [name] reported that patient can flush medication and tube with 30 ml of water.

## 2015-05-20 NOTE — Telephone Encounter (Signed)
I returned [name]'s call re Zofran . She will contact their billing dept to see if medicare was notified that pt revoked hospice so that inusrance will cover zofran,. She also stated it may take medicare 30 days to update their files.

## 2015-05-23 ENCOUNTER — Encounter (HOSPITAL_COMMUNITY): Payer: Self-pay

## 2015-05-23 ENCOUNTER — Ambulatory Visit (HOSPITAL_COMMUNITY): Payer: Medicare Other

## 2015-05-23 ENCOUNTER — Ambulatory Visit (HOSPITAL_COMMUNITY): Payer: Medicare Other | Admitting: Anesthesiology

## 2015-05-23 ENCOUNTER — Encounter (HOSPITAL_COMMUNITY)
Admission: RE | Disposition: A | Discharge status: Home / Self Care | Payer: Self-pay | Source: Ambulatory Visit | Attending: Gastroenterology

## 2015-05-23 ENCOUNTER — Ambulatory Visit (HOSPITAL_COMMUNITY)
Admission: RE | Admit: 2015-05-23 | Discharge: 2015-05-23 | Disposition: A | Discharge status: Home / Self Care | Payer: Medicare Other | Source: Ambulatory Visit | Attending: Gastroenterology | Admitting: Gastroenterology

## 2015-05-23 DIAGNOSIS — K831 Obstruction of bile duct: Secondary | ICD-10-CM | POA: Insufficient documentation

## 2015-05-23 DIAGNOSIS — N189 Chronic kidney disease, unspecified: Secondary | ICD-10-CM | POA: Insufficient documentation

## 2015-05-23 DIAGNOSIS — R17 Unspecified jaundice: Secondary | ICD-10-CM

## 2015-05-23 DIAGNOSIS — C3490 Malignant neoplasm of unspecified part of unspecified bronchus or lung: Secondary | ICD-10-CM | POA: Diagnosis not present

## 2015-05-23 DIAGNOSIS — Z9221 Personal history of antineoplastic chemotherapy: Secondary | ICD-10-CM | POA: Insufficient documentation

## 2015-05-23 DIAGNOSIS — Z923 Personal history of irradiation: Secondary | ICD-10-CM | POA: Diagnosis not present

## 2015-05-23 HISTORY — DX: Orthostatic hypotension: I95.1

## 2015-05-23 HISTORY — PX: ERCP: SHX5425

## 2015-05-23 HISTORY — DX: Acute kidney failure, unspecified: N17.9

## 2015-05-23 HISTORY — DX: Reserved for inherently not codable concepts without codable children: IMO0001

## 2015-05-23 LAB — CBC
HEMATOCRIT: 38.5 % — AB (ref 39.0–52.0)
Hemoglobin: 12.9 g/dL — ABNORMAL LOW (ref 13.0–17.0)
MCH: 35.4 pg — AB (ref 26.0–34.0)
MCHC: 33.5 g/dL (ref 30.0–36.0)
MCV: 105.8 fL — AB (ref 78.0–100.0)
PLATELETS: 170 10*3/uL (ref 150–400)
RBC: 3.64 MIL/uL — AB (ref 4.22–5.81)
RDW: 15.7 % — AB (ref 11.5–15.5)
WBC: 9.5 10*3/uL (ref 4.0–10.5)

## 2015-05-23 LAB — BASIC METABOLIC PANEL
Anion gap: 13 (ref 5–15)
BUN: 28 mg/dL — AB (ref 6–20)
CHLORIDE: 102 mmol/L (ref 101–111)
CO2: 23 mmol/L (ref 22–32)
CREATININE: 0.96 mg/dL (ref 0.61–1.24)
Calcium: 10.2 mg/dL (ref 8.9–10.3)
GFR calc Af Amer: >60 mL/min (ref >60)
GFR calc non Af Amer: >60 mL/min (ref >60)
Glucose, Bld: 109 mg/dL — ABNORMAL HIGH (ref 65–99)
POTASSIUM: 4.5 mmol/L (ref 3.5–5.1)
Sodium: 138 mmol/L (ref 135–145)

## 2015-05-23 SURGERY — ERCP, WITH INTERVENTION IF INDICATED
Anesthesia: General

## 2015-05-23 MED ORDER — SUCCINYLCHOLINE CHLORIDE 20 MG/ML IJ SOLN
INTRAMUSCULAR | Status: DC | PRN
  Administered 2015-05-23: 60 mg via INTRAVENOUS

## 2015-05-23 MED ORDER — LACTATED RINGERS IV SOLN
INTRAVENOUS | Status: DC | PRN
  Administered 2015-05-23: 09:00:00 via INTRAVENOUS

## 2015-05-23 MED ORDER — FENTANYL CITRATE (PF) 100 MCG/2ML IJ SOLN
INTRAMUSCULAR | Status: DC | PRN
  Administered 2015-05-23: 50 ug via INTRAVENOUS

## 2015-05-23 MED ORDER — CIPROFLOXACIN IN D5W 400 MG/200ML IV SOLN
INTRAVENOUS | Status: AC
  Filled 2015-05-23: qty 200

## 2015-05-23 MED ORDER — LIDOCAINE HCL (CARDIAC) 20 MG/ML IV SOLN
INTRAVENOUS | Status: DC | PRN
  Administered 2015-05-23: 60 mg via INTRAVENOUS

## 2015-05-23 MED ORDER — LACTATED RINGERS IV SOLN
INTRAVENOUS | Status: DC
  Administered 2015-05-23: 1000 mL via INTRAVENOUS

## 2015-05-23 MED ORDER — PHENYLEPHRINE HCL 10 MG/ML IJ SOLN
INTRAMUSCULAR | Status: DC | PRN
  Administered 2015-05-23 (×3): 80 ug via INTRAVENOUS

## 2015-05-23 MED ORDER — DEXTROSE 5 % IV SOLN
1.0000 g | INTRAVENOUS | Status: AC
  Administered 2015-05-23: 1 g via INTRAVENOUS
  Filled 2015-05-23: qty 10

## 2015-05-23 MED ORDER — SODIUM CHLORIDE 0.9 % IV SOLN
INTRAVENOUS | Status: DC
  Administered 2015-05-23: 09:00:00 via INTRAVENOUS

## 2015-05-23 MED ORDER — PROPOFOL 10 MG/ML IV BOLUS
INTRAVENOUS | Status: DC | PRN
  Administered 2015-05-23: 100 mg via INTRAVENOUS

## 2015-05-23 MED ORDER — SODIUM CHLORIDE 0.9 % IV SOLN
INTRAVENOUS | Status: DC | PRN
  Administered 2015-05-23: 20 mL

## 2015-05-23 NOTE — Interval H&P Note (Signed)
History and Physical Interval Note:  05/23/2015 8:48 AM  [name]  has presented today for surgery, with the diagnosis of jaundice/elev bilirubin  The various methods of treatment have been discussed with the patient and family. After consideration of risks, benefits and other options for treatment, the patient has consented to  Procedure(s): ENDOSCOPIC RETROGRADE CHOLANGIOPANCREATOGRAPHY (ERCP) (N/A) as a surgical intervention .  The patient's history has been reviewed, patient examined, no change in status, stable for surgery.  I have reviewed the patient's chart and labs.  Questions were answered to the patient's satisfaction.     Zayed Griffie D

## 2015-05-23 NOTE — Anesthesia Preprocedure Evaluation (Addendum)
Anesthesia Evaluation  Patient identified by MRN, date of birth, ID band Patient awake    Reviewed: Allergy & Precautions, NPO status , Patient's Chart, lab work & pertinent test results  History of Anesthesia Complications Negative for: history of anesthetic complications  Airway Mallampati: II  TM Distance: <3 FB Neck ROM: Full    Dental  (+) Dental Advisory Given, Partial Upper, Missing   Pulmonary shortness of breath and with exertion, pneumonia, resolved, former smoker,  Non-small cell lung cancer s/p radiation, chemotherapy   Pulmonary exam normal breath sounds clear to auscultation       Cardiovascular Exercise Tolerance: Poor hypertension, (-) angina(-) CAD and (-) Past MI Normal cardiovascular exam Rhythm:Regular Rate:Normal  Hypotension-on midodrine   Neuro/Psych Vertigo    GI/Hepatic GERD  Medicated,Jaundice, elevated bilirubin s/p biliary stent 10/2014 Dysphagia s/p G-tube 10/2014   Endo/Other  negative endocrine ROS  Renal/GU negative Renal ROS     Musculoskeletal  (+) Arthritis , Osteoarthritis,    Abdominal   Peds  Hematology  (+) Blood dyscrasia, anemia ,   Anesthesia Other Findings Day of surgery medications reviewed with the patient.  Sepsis in January 2017  Reproductive/Obstetrics                          Anesthesia Physical Anesthesia Plan  ASA: III  Anesthesia Plan: General   Post-op Pain Management:    Induction: Intravenous  Airway Management Planned: Oral ETT  Additional Equipment:   Intra-op Plan:   Post-operative Plan: Extubation in OR  Informed Consent: I have reviewed the patients History and Physical, chart, labs and discussed the procedure including the risks, benefits and alternatives for the proposed anesthesia with the patient or authorized representative who has indicated his/her understanding and acceptance.   Dental advisory given  Plan  Discussed with: CRNA  Anesthesia Plan Comments: (Risks/benefits of general anesthesia discussed with patient including risk of damage to teeth, lips, gum, and tongue, nausea/vomiting, allergic reactions to medications, and the possibility of heart attack, stroke and death.  All patient questions answered.  Patient wishes to proceed.)        Anesthesia Quick Evaluation

## 2015-05-23 NOTE — H&P (View-Only) (Signed)
[facility] PROGRESS NOTE  [name] EQA:[id number] DOB: [birth year] DOA: 04/23/2015 PCP: [name], MD Brief narrative 70 year old male with history of metastatic non-small cell lung cancer (adenocarcinoma) diagnosed in September 2010 status post chemotherapy and radiation and was on maintenance chemoimmunotherapy on September 2016 and was referred to home hospice given disease progression. However patient has been doing well on hospice. He has a PEG tube placed and receives full nutrition fluid. 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 On 1/25 he was brought to the ED by EMS for fever 102F, weakness, abdominal pain for one week associated with nausea. When EMS evaluated the patient he was hypotensive to 60/40 mmHg. In the ED remained hypotensive with systolic blood pressure in the mid 70s, workup showed a wbc of 12.7 K, lactate of 4.15, acute kidney injury with creatinine of 1.39 and transaminitis. Patient admitted to ICU under critical care service for septic shock and started on Levophed as family requested for pressors.  Assessment/Plan: Septic shock . Blood cultures on admission 2 growing Klebsiella and Enterobacter both sensitive to Rocephin. Antibiotic changed to IV Rocephin and will plan on 2 weeks course upon discharge. Can be given through his Port-A-Cath...  urine culture unremarkable.  - 2-D echo negative for any vegetations. -Sepsis  resolved. Off pressors. Blood pressure now elevated.. Hold midodrine ( pt on it as outpt for low BP and dizziness). Added prn hydralazine. -tube Feeds resumed. Off  IV fluids. PT recommends outpatient physical therapy. (Gets outpatient vestibular rehabilitation as planned.  Metastatic non-small cell lung cancer (adenocarcinoma) Followed by Dr. [name] and had received chemotherapy and radiation which was stopped in September 2016 due to disease progression and was referred to home hospice.  Patient currently being followed by hospice. reports that he has done quite well and is interested in following up with Dr. [name] again.  Abdominal pain with progressive  transaminitis Initially thought to be due to septic shock. However patient has worsened transaminitis and elevated bilirubin. Also having epigastric pain with nausea. 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  check abdominal ultrasound unremarkable..  -Lipase normal. Follow hepatitis panel. Spoke with Dr. Benson Norway had attempted EUS back in May 2016 and informs me that it was usuccessful procedure since it was extremely difficult to placing a stent. He had a percutaneous stent placed by IR which was then internalized. -Continue Reglan for nausea. Resumed 2 feeds is morning and tolerating well. Continue PPI. -It is unclear if the stent has dislodged or gotten obstructed versus any compressive mass that is contemplating to his transaminitis. I will obtain an MRCP to evaluate for any of suction and stent position.   Acute kidney injury Prerenal secondary to dehydration and septic shock. Improved with hydration.   Hypokalemia Replenished  Severe protein calorie malnutrition Tube feeds resumed. Continue free water.  Hypomagnesemia Replenished  vertigo Getting outpatient vestibular rehabilitation. Will need to note to go back to rehabilitation upon discharge.  Diet: tube feeds   DVT prophylaxis: Subcutaneous heparin   Code Status: DO NOT RESUSCITATE Family Communication: Wife at bedside Disposition Plan: Home once abdominal pain and transaminitis improved.  Consultants:  PC CM  Procedures:  2-D echo  CT abdomen and pelvis  Ultrasound abdomen  MRCP  Antibiotics:  Vancomycin1/25-1/27  Aztreonam 1/25-1/29  Rocephin 1/29--  HPI/Subjective: Seen and examined. Epigastric pain and nausea better.  Objective: Filed Vitals:  04/28/15 0612 04/28/15 1341  BP: 105/74 98/72  Pulse: 96 92  Temp: 97.6 F (36.4 C) 97.9 F (36.6 C)  Resp: 18 17    Intake/Output Summary (Last 24 hours) at 04/28/15 1355 Last data filed at 04/28/15 1341  Gross per 24 hour  Intake    400 ml  Output    975 ml  Net   -575 ml   Filed Weights   04/26/15 0453 04/27/15 0547 04/28/15 0612  Weight: 54.1 kg (119 lb 4.3 oz) 52.4 kg (115 lb 8.3 oz) 51.7 kg (113 lb 15.7 oz)    Exam:   General:  appears fatigued  HEENT: , moist mucosa  Cardiovascular: Normal S1 and S2, no murmurs  Respiratory: Clear bilaterally, no added sounds, Port-A-Cath+  Abdomen: Soft, nondistended, no epigastric tenderness, PEG tube in place, bowel sounds present,  Musculoskeletal: Warm, no edema  CNS: Alert and Oriented   Data Reviewed: Basic Metabolic Panel:  Recent Labs Lab 04/24/15 0415 04/25/15 1350 04/26/15 0638 04/27/15 0527 04/28/15 0607  NA 139 145 146* 143 144  K 3.8 3.2* 3.3* 3.6 3.9  CL 111 115* 116* 113* 110  CO2 21* 19* '22 22 24  '$ GLUCOSE 94 90 166* 153* 107*  BUN 31* 26* 23* 20 19  CREATININE 1.17 0.95 0.89 0.82 0.61  CALCIUM 8.2* 9.0 8.8* 8.9 9.4  MG 1.4*  --   --   --   --   PHOS 3.1  --   --   --   --    Liver Function Tests:  Recent Labs Lab 04/24/15 0415 04/25/15 1350 04/26/15 0638 04/27/15 0527 04/28/15 0607  AST 142* 63* 84* 161* 150*  ALT 296* 197* 182* 253* 261*  ALKPHOS 354* 343* 381* 433* 445*  BILITOT 4.5* 5.3* 5.3* 5.9* 7.8*  PROT 5.1* 5.4* 5.5* 5.7* 6.0*  ALBUMIN 2.5* 2.6* 2.5* 2.7* 2.7*    Recent Labs Lab 04/27/15 1220  LIPASE 22   No results for input(s): AMMONIA in the last 168 hours. CBC:  Recent Labs Lab 04/23/15 1903 04/24/15 0415 04/26/15 0638  WBC 12.7* 16.5* 6.9  NEUTROABS 11.7*  --   --   HGB 11.4* 10.2* 10.2*  HCT 34.1* 30.1* 30.1*  MCV 103.6* 103.1* 102.4*  PLT 150 137* 150   Cardiac Enzymes:  Recent Labs Lab 04/24/15 0203 04/24/15 0835 04/24/15 1439  04/24/15 2112 04/25/15 0225  TROPONINI 0.10* 0.21* 0.20* 0.14* 0.13*   BNP (last 3 results) No results for input(s): BNP in the last 8760 hours.  ProBNP (last 3 results) No results for input(s): PROBNP in the last 8760 hours.  CBG: No results for input(s): GLUCAP in the last 168 hours.  Recent Results (from the past 240 hour(s))  Culture, blood (routine x 2)     Status: None (Preliminary result)   Collection Time: 04/23/15  7:03 PM  Result Value Ref Range Status   Specimen Description BLOOD LEFT HAND  Final   Special Requests BOTTLES DRAWN AEROBIC AND ANAEROBIC 5ML  Final   Culture  Setup Time   Final    GRAM NEGATIVE RODS IN BOTH AEROBIC AND ANAEROBIC BOTTLES CRITICAL RESULT CALLED TO, READ BACK BY AND VERIFIED WITH: A. Koontz 11:00 04/24/15 ([email or web address])    Culture   Final    KLEBSIELLA SPECIES SUSCEPTIBILITIES PERFORMED ON PREVIOUS CULTURE WITHIN THE LAST 5 DAYS. STENOTROPHOMONAS MALTOPHILIA SUSCEPTIBILITIES TO FOLLOW Performed at [facility]    Report Status PENDING  Incomplete  Culture, blood (routine x 2)  Status: None   Collection Time: 04/23/15  8:55 PM  Result Value Ref Range Status   Specimen Description BLOOD RIGHT CHEST PORTA CATH  Final   Special Requests BOTTLES DRAWN AEROBIC AND ANAEROBIC 5CC  Final   Culture  Setup Time   Final    GRAM NEGATIVE RODS IN BOTH AEROBIC AND ANAEROBIC BOTTLES CRITICAL RESULT CALLED TO, READ BACK BY AND VERIFIED WITH: Velta Addison RN 11:00 04/24/15 ([email or web address])    Culture   Final    KLEBSIELLA SPECIES ENTEROBACTER CLOACAE Performed at [facility]    Report Status 04/27/2015 FINAL  Final   Organism ID, Bacteria KLEBSIELLA SPECIES  Final   Organism ID, Bacteria ENTEROBACTER CLOACAE  Final      Susceptibility   Enterobacter cloacae - MIC*    CEFAZOLIN >=64 RESISTANT Resistant     CEFEPIME <=1 SENSITIVE Sensitive     CEFTAZIDIME <=1 SENSITIVE Sensitive     CEFTRIAXONE <=1 SENSITIVE Sensitive     CIPROFLOXACIN  <=0.25 SENSITIVE Sensitive     GENTAMICIN <=1 SENSITIVE Sensitive     IMIPENEM <=0.25 SENSITIVE Sensitive     TRIMETH/SULFA <=20 SENSITIVE Sensitive     PIP/TAZO <=4 SENSITIVE Sensitive     * ENTEROBACTER CLOACAE   Klebsiella species - MIC*    AMPICILLIN >=32 RESISTANT Resistant     CEFAZOLIN <=4 SENSITIVE Sensitive     CEFEPIME <=1 SENSITIVE Sensitive     CEFTAZIDIME <=1 SENSITIVE Sensitive     CEFTRIAXONE <=1 SENSITIVE Sensitive     CIPROFLOXACIN <=0.25 SENSITIVE Sensitive     GENTAMICIN <=1 SENSITIVE Sensitive     IMIPENEM <=0.25 SENSITIVE Sensitive     TRIMETH/SULFA <=20 SENSITIVE Sensitive     AMPICILLIN/SULBACTAM 4 SENSITIVE Sensitive     PIP/TAZO <=4 SENSITIVE Sensitive     * KLEBSIELLA SPECIES  Urine culture     Status: None   Collection Time: 04/23/15  9:00 PM  Result Value Ref Range Status   Specimen Description URINE, CATHETERIZED  Final   Special Requests NONE  Final   Culture   Final    NO GROWTH 2 DAYS Performed at [facility]    Report Status 04/25/2015 FINAL  Final  MRSA PCR Screening     Status: None   Collection Time: 04/23/15 10:35 PM  Result Value Ref Range Status   MRSA by PCR NEGATIVE NEGATIVE Final    Comment:        The GeneXpert MRSA Assay (FDA approved for NASAL specimens only), is one component of a comprehensive MRSA colonization surveillance program. It is not intended to diagnose MRSA infection nor to guide or monitor treatment for MRSA infections.   Culture, blood (routine x 2)     Status: None (Preliminary result)   Collection Time: 04/25/15  4:30 PM  Result Value Ref Range Status   Specimen Description BLOOD RIGHT ARM  Final   Special Requests BOTTLES DRAWN AEROBIC AND ANAEROBIC  10CC  Final   Culture   Final    NO GROWTH 2 DAYS Performed at [facility]    Report Status PENDING  Incomplete  Culture, blood (routine x 2)     Status: None (Preliminary result)   Collection Time: 04/25/15  4:40 PM  Result Value Ref  Range Status   Specimen Description BLOOD RIGHT HAND  Final   Special Requests BOTTLES DRAWN AEROBIC AND ANAEROBIC 4CC,5CC  Final   Culture   Final    NO GROWTH 2 DAYS Performed at [facility]    Report Status PENDING  Incomplete     Studies: US Abdomen Complete  04/28/2015  CLINICAL DATA:  Abdominal pain. EXAM: ABDOMEN ULTRASOUND COMPLETE COMPARISON:  CT of 04/23/2015. FINDINGS: Gallbladder: No gallstones or wall thickening visualized. No sonographic Murphy sign noted by sonographer. Common bile duct: Diameter: Normal, 5 mm. Common duct stent not visualized. Liver: No focal lesion.  Pneumobilia incidentally noted. IVC: No abnormality visualized. Pancreas: Poorly visualized due to overlying bowel gas. Spleen: Size and appearance within normal limits. Right Kidney: Length: 10.9 cm. Echogenicity within normal limits. No mass or hydronephrosis visualized. Left Kidney: Length: 11.5 cm. Interpolar left renal cyst. No hydronephrosis. Abdominal aorta: No aneurysm visualized. Other findings: Left-sided pleural effusion incidentally noted. IMPRESSION: 1.  No acute process or explanation for abdominal pain. 2. No biliary duct dilatation. Pneumobilia which is likely secondary to the biliary stent. 3. Small left pleural effusion. Electronically Signed   By: Abigail Miyamoto M.D.   On: 04/28/2015 11:57    Scheduled Meds: . antiseptic oral rinse  7 mL Mouth Rinse q12n4p  . cefTRIAXone (ROCEPHIN)  IV  2 g Intravenous Q24H  . chlorhexidine  15 mL Mouth Rinse BID  . free water  300 mL Per Tube 6 times per day  . heparin  5,000 Units Subcutaneous 3 times per day  . pantoprazole (PROTONIX) IV  40 mg Intravenous QHS   Continuous Infusions: . feeding supplement (OSMOLITE 1.5 CAL) 1,000 mL (04/28/15 1021)      Time spent: 25 minutes    Ladon Heney, [place]  [facility] Pager [phone]. If 7PM-7AM, please contact night-coverage at [email or web address], password [facility] 04/28/2015, 1:55 PM  LOS: 5 days

## 2015-05-23 NOTE — Discharge Instructions (Signed)
Endoscopic Retrograde Cholangiopancreatography (ERCP), Care After °Refer to this sheet in the next few weeks. These instructions provide you with information on caring for yourself after your procedure. Your health care provider may also give you more specific instructions. Your treatment has been planned according to current medical practices, but problems sometimes occur. Call your health care provider if you have any problems or questions after your procedure.  °WHAT TO EXPECT AFTER THE PROCEDURE  °After your procedure, it is typical to feel:  °· Soreness in your throat.   °· Sick to your stomach (nauseous).   °· Bloated. °· Dizzy.   °· Fatigued. °HOME CARE INSTRUCTIONS °· Have a friend or family member stay with you for the first 24 hours after your procedure. °· Start taking your usual medicines and eating normally as soon as you feel well enough to do so or as directed by your health care provider. °SEEK MEDICAL CARE IF: °· You have abdominal pain.   °· You develop signs of infection, such as:   °¨ Chills.   °¨ Feeling unwell.   °SEEK IMMEDIATE MEDICAL CARE IF: °· You have difficulty swallowing. °· You have worsening throat, chest, or abdominal pain. °· You vomit. °· You have bloody or very black stools. °· You have a fever. °  °This information is not intended to replace advice given to you by your health care provider. Make sure you discuss any questions you have with your health care provider. °  °Document Released: 01/03/2013 Document Reviewed: 01/03/2013 °Elsevier Interactive Patient Education ©2016 [employer] ° °

## 2015-05-23 NOTE — Anesthesia Procedure Notes (Signed)
Procedure Name: Intubation Date/Time: 05/23/2015 9:31 AM Performed by: Eligha Bridegroom Pre-anesthesia Checklist: Patient identified, Timeout performed, Emergency Drugs available, Suction available and Patient being monitored Patient Re-evaluated:Patient Re-evaluated prior to inductionOxygen Delivery Method: Circle system utilized Preoxygenation: Pre-oxygenation with 100% oxygen Intubation Type: IV induction Ventilation: Mask ventilation without difficulty Laryngoscope Size: Mac and 4 Grade View: Grade I Tube type: Oral Tube size: 7.5 mm Number of attempts: 1 Airway Equipment and Method: Stylet Placement Confirmation: ETT inserted through vocal cords under direct vision,  breath sounds checked- equal and bilateral and positive ETCO2 Secured at: 21 cm Tube secured with: Tape Dental Injury: Teeth and Oropharynx as per pre-operative assessment

## 2015-05-23 NOTE — Anesthesia Postprocedure Evaluation (Signed)
Anesthesia Post Note  Patient: [name]  Procedure(s) Performed: Procedure(s) (LRB): ENDOSCOPIC RETROGRADE CHOLANGIOPANCREATOGRAPHY (ERCP) (N/A)  Patient location during evaluation: PACU Anesthesia Type: General Level of consciousness: awake and alert Pain management: pain level controlled Vital Signs Assessment: post-procedure vital signs reviewed and stable Respiratory status: spontaneous breathing, nonlabored ventilation, respiratory function stable and patient connected to nasal cannula oxygen Cardiovascular status: blood pressure returned to baseline and stable Postop Assessment: no signs of nausea or vomiting Anesthetic complications: no    Last Vitals:  Filed Vitals:   05/23/15 1110 05/23/15 1120  BP: 99/57 101/69  Pulse: 86 87  Temp:    Resp: 16 13    Last Pain: There were no vitals filed for this visit.               Catalina Gravel

## 2015-05-23 NOTE — Op Note (Signed)
[facility] [place], [address]   ERCP PROCEDURE REPORT        EXAM DATE: 05/23/2015  PATIENT NAME:          [name], [name]          MR #: [id number] BIRTHDATE:       [birth year]     VISIT #:     [phone] ATTENDING:     Carol Ada, MD     STATUS:     outpatient ASSISTANT:      Elspeth Cho and Cleda Daub  INDICATIONS:  The patient is a 70 yr old male here for an ERCP due to obstructive jaundice. PROCEDURE PERFORMED:     ERCP with stent exchange.  MEDICATIONS:     General Anesthesia  CONSENT: The patient understands the risks and benefits of the procedure and understands that these risks include, but are not limited to: sedation, allergic reaction, infection, perforation and/or bleeding. Alternative means of evaluation and treatment include, among others: physical exam, x-rays, and/or surgical intervention. The patient elects to proceed with this endoscopic procedure.  DESCRIPTION OF PROCEDURE: During intra-op preparation period all mechanical & medical equipment was checked for proper function. Hand hygiene and appropriate measures for infection prevention was taken. After the risks, benefits and alternatives of the procedure were thoroughly explained, Informed was verified, confirmed and timeout was successfully executed by the treatment team. With the patient in left semi-prone position, medications were administered intravenously.The Pentax Ercp Scope [id number] was passed from the mouth into the esophagus and further advanced from the esophagus into the stomach. From stomach scope was directed to the second portion of the duodenum.  Major papilla was aligned with the duodenoscope. The scope position was confirmed fluoroscopically. Rest of the findings/therapeutics are given below. The scope was then completely withdrawn from the patient and the procedure completed. The pulse, BP, and O2 saturation were monitored  and documented by the physician and the nursing staff throughout the entire procedure. The patient was cared for as planned according to standard protocol. The patient was then discharged to recovery in stable condition and with appropriate post procedure care. Estimated blood loss is zero unless otherwise noted in this procedure report.  In the second portion of the duodenum the metallic stent was identified to be out of the CBD (Image 1).  The stent was pulled out with a rat-toothed forceps and an attempt was made to pull the stent out of the esophagus, but some resistence was encountered. The stent was then pushed back into the gastric lumen and released. The duodenoscope was then inserted to the second portion of the duodenum.  During the first attempt with cannulation of the CBD the guidewire looped and cannulation did not occur.  A small orifice was noted and it was felt to be the PD, but I was not completely certain.  Gentle insertion of the guidwire into the orifice was performed and fluoroscopy confirmed the PD cannulation. Readjustment of the duodenoscope was performed and the CBD was grossly visualized (Image 003).  The guidewire was then cannulated into the CBD and secured in the right intrahepatic ducts.  Contrast injection revealed normal caliber intra hepatic and extrahepatic ducts.  Just distal to the bifurcation a small intrinsic stricture was identified.  This was 6 cm from the ampullary orifice.  A 6 cm metallic stent was not available and an 8.5 Fr x 7 cm stent was successfully placed.  This was confirmed fluoroscopically.  The duodenoscope was changed out for an upper endoscope to extract the stent from the gastric lumen.  Grasping the wire loop used to withdraw the metallic stent with the rat-tootched forcep, the stent was gentley guided out of the stomach and esophagus.  Minimal resistence was encountered, but the mucosa was irritated from the prior attempt at  stent extraction.  No evidence of crepitus.    ADVERSE EVENT:     No immediate. IMPRESSIONS:     1) Proximal CBD stricture. 2) Out of position metallic biliary stent. 3) Reinsertion of a plastic biliary stent.  RECOMMENDATIONS:     1) ERCP next Friday for metallic stent placement. REPEAT EXAM:   ___________________________________ Carol Ada, MD eSigned:  Carol Ada, MD 2015/05/25 10:29 AM   cc:  CPT CODES: ICD9 CODES:  The ICD and CPT codes recommended by this software are interpretations from the data that the clinical staff has captured with the software.  The verification of the translation of this report to the ICD and CPT codes and modifiers is the sole responsibility of the health care institution and practicing physician where this report was generated.  [employer] will not be held responsible for the validity of the ICD and CPT codes included on this report.  AMA assumes no liability for data contained or not contained herein. CPT is a Designer, television/film set of the [employer].   PATIENT NAME:  Nathan, [name] MR#: [id number]

## 2015-05-23 NOTE — Transfer of Care (Signed)
Immediate Anesthesia Transfer of Care Note  Patient: [name]  Procedure(s) Performed: Procedure(s): ENDOSCOPIC RETROGRADE CHOLANGIOPANCREATOGRAPHY (ERCP) (N/A)  Patient Location: PACU and Endoscopy Unit  Anesthesia Type:General  Level of Consciousness: awake and alert   Airway & Oxygen Therapy: Patient Spontanous Breathing and Patient connected to nasal cannula oxygen  Post-op Assessment: Report given to RN and Post -op Vital signs reviewed and stable  Post vital signs: Reviewed and stable  Last Vitals:  Filed Vitals:   05/23/15 0813  BP: 106/71  Pulse: 87  Temp: 36.7 C  Resp: 18    Complications: No apparent anesthesia complications

## 2015-05-26 ENCOUNTER — Encounter (HOSPITAL_COMMUNITY): Payer: Self-pay | Admitting: Gastroenterology

## 2015-05-26 ENCOUNTER — Other Ambulatory Visit: Payer: Self-pay | Admitting: Gastroenterology

## 2015-05-27 ENCOUNTER — Encounter (HOSPITAL_COMMUNITY): Payer: Self-pay | Admitting: *Deleted

## 2015-05-28 NOTE — Telephone Encounter (Signed)
error 

## 2015-05-30 ENCOUNTER — Ambulatory Visit (HOSPITAL_COMMUNITY)
Admission: RE | Admit: 2015-05-30 | Discharge: 2015-05-30 | Disposition: A | Discharge status: Home / Self Care | Payer: Medicare Other | Source: Ambulatory Visit | Attending: Gastroenterology | Admitting: Gastroenterology

## 2015-05-30 ENCOUNTER — Encounter (HOSPITAL_COMMUNITY): Payer: Self-pay

## 2015-05-30 ENCOUNTER — Ambulatory Visit (HOSPITAL_COMMUNITY): Payer: Medicare Other | Admitting: Registered Nurse

## 2015-05-30 ENCOUNTER — Encounter (HOSPITAL_COMMUNITY)
Admission: RE | Disposition: A | Discharge status: Home / Self Care | Payer: Self-pay | Source: Ambulatory Visit | Attending: Gastroenterology

## 2015-05-30 ENCOUNTER — Ambulatory Visit (HOSPITAL_COMMUNITY): Payer: Medicare Other

## 2015-05-30 DIAGNOSIS — Z89022 Acquired absence of left finger(s): Secondary | ICD-10-CM | POA: Diagnosis not present

## 2015-05-30 DIAGNOSIS — E46 Unspecified protein-calorie malnutrition: Secondary | ICD-10-CM | POA: Diagnosis not present

## 2015-05-30 DIAGNOSIS — Z931 Gastrostomy status: Secondary | ICD-10-CM | POA: Insufficient documentation

## 2015-05-30 DIAGNOSIS — Z800 Family history of malignant neoplasm of digestive organs: Secondary | ICD-10-CM | POA: Insufficient documentation

## 2015-05-30 DIAGNOSIS — I10 Essential (primary) hypertension: Secondary | ICD-10-CM | POA: Insufficient documentation

## 2015-05-30 DIAGNOSIS — Z79899 Other long term (current) drug therapy: Secondary | ICD-10-CM | POA: Insufficient documentation

## 2015-05-30 DIAGNOSIS — D649 Anemia, unspecified: Secondary | ICD-10-CM | POA: Insufficient documentation

## 2015-05-30 DIAGNOSIS — R748 Abnormal levels of other serum enzymes: Secondary | ICD-10-CM | POA: Diagnosis not present

## 2015-05-30 DIAGNOSIS — Z87891 Personal history of nicotine dependence: Secondary | ICD-10-CM | POA: Diagnosis not present

## 2015-05-30 DIAGNOSIS — R17 Unspecified jaundice: Secondary | ICD-10-CM | POA: Diagnosis not present

## 2015-05-30 DIAGNOSIS — Z9221 Personal history of antineoplastic chemotherapy: Secondary | ICD-10-CM | POA: Diagnosis not present

## 2015-05-30 DIAGNOSIS — Z85118 Personal history of other malignant neoplasm of bronchus and lung: Secondary | ICD-10-CM | POA: Insufficient documentation

## 2015-05-30 DIAGNOSIS — K219 Gastro-esophageal reflux disease without esophagitis: Secondary | ICD-10-CM | POA: Insufficient documentation

## 2015-05-30 DIAGNOSIS — I959 Hypotension, unspecified: Secondary | ICD-10-CM | POA: Diagnosis not present

## 2015-05-30 DIAGNOSIS — Z4659 Encounter for fitting and adjustment of other gastrointestinal appliance and device: Secondary | ICD-10-CM | POA: Insufficient documentation

## 2015-05-30 DIAGNOSIS — K831 Obstruction of bile duct: Secondary | ICD-10-CM | POA: Insufficient documentation

## 2015-05-30 DIAGNOSIS — Z923 Personal history of irradiation: Secondary | ICD-10-CM | POA: Insufficient documentation

## 2015-05-30 HISTORY — DX: Gastrostomy status: Z93.1

## 2015-05-30 HISTORY — DX: Dysphagia, unspecified: R13.10

## 2015-05-30 HISTORY — PX: ERCP: SHX5425

## 2015-05-30 SURGERY — ERCP, WITH INTERVENTION IF INDICATED
Anesthesia: General

## 2015-05-30 MED ORDER — FENTANYL CITRATE (PF) 100 MCG/2ML IJ SOLN
INTRAMUSCULAR | Status: DC | PRN
  Administered 2015-05-30: 50 ug via INTRAVENOUS

## 2015-05-30 MED ORDER — SUGAMMADEX SODIUM 200 MG/2ML IV SOLN
INTRAVENOUS | Status: AC
  Filled 2015-05-30: qty 2

## 2015-05-30 MED ORDER — ONDANSETRON HCL 4 MG/2ML IJ SOLN: 4.0000 mg | Freq: Once | INTRAMUSCULAR | Status: DC | PRN

## 2015-05-30 MED ORDER — PHENYLEPHRINE HCL 10 MG/ML IJ SOLN
INTRAMUSCULAR | Status: DC | PRN
  Administered 2015-05-30: 160 ug via INTRAVENOUS
  Administered 2015-05-30: 120 ug via INTRAVENOUS

## 2015-05-30 MED ORDER — SUCCINYLCHOLINE CHLORIDE 20 MG/ML IJ SOLN
INTRAMUSCULAR | Status: DC | PRN
  Administered 2015-05-30: 100 mg via INTRAVENOUS

## 2015-05-30 MED ORDER — SUGAMMADEX SODIUM 200 MG/2ML IV SOLN
INTRAVENOUS | Status: DC | PRN
  Administered 2015-05-30: 100 mg via INTRAVENOUS

## 2015-05-30 MED ORDER — INDOMETHACIN 50 MG RE SUPP
RECTAL | Status: AC
  Filled 2015-05-30: qty 1

## 2015-05-30 MED ORDER — PROPOFOL 10 MG/ML IV BOLUS
INTRAVENOUS | Status: DC | PRN
  Administered 2015-05-30: 100 mg via INTRAVENOUS

## 2015-05-30 MED ORDER — GLUCAGON HCL RDNA (DIAGNOSTIC) 1 MG IJ SOLR
INTRAMUSCULAR | Status: AC
  Filled 2015-05-30: qty 1

## 2015-05-30 MED ORDER — PROPOFOL 10 MG/ML IV BOLUS
INTRAVENOUS | Status: AC
  Filled 2015-05-30: qty 20

## 2015-05-30 MED ORDER — ROCURONIUM BROMIDE 100 MG/10ML IV SOLN
INTRAVENOUS | Status: DC | PRN
  Administered 2015-05-30: 10 mg via INTRAVENOUS

## 2015-05-30 MED ORDER — ONDANSETRON HCL 4 MG/2ML IJ SOLN
INTRAMUSCULAR | Status: AC
  Filled 2015-05-30: qty 2

## 2015-05-30 MED ORDER — PHENYLEPHRINE 40 MCG/ML (10ML) SYRINGE FOR IV PUSH (FOR BLOOD PRESSURE SUPPORT)
PREFILLED_SYRINGE | INTRAVENOUS | Status: AC
  Filled 2015-05-30: qty 10

## 2015-05-30 MED ORDER — LIDOCAINE HCL (CARDIAC) 20 MG/ML IV SOLN
INTRAVENOUS | Status: AC
  Filled 2015-05-30: qty 5

## 2015-05-30 MED ORDER — FENTANYL CITRATE (PF) 100 MCG/2ML IJ SOLN: 25.0000 ug | INTRAMUSCULAR | Status: DC | PRN

## 2015-05-30 MED ORDER — ONDANSETRON HCL 4 MG/2ML IJ SOLN
INTRAMUSCULAR | Status: DC | PRN
  Administered 2015-05-30: 4 mg via INTRAVENOUS

## 2015-05-30 MED ORDER — SODIUM CHLORIDE 0.9 % IV SOLN: INTRAVENOUS | Status: DC

## 2015-05-30 MED ORDER — SODIUM CHLORIDE 0.9 % IV SOLN
INTRAVENOUS | Status: DC | PRN
  Administered 2015-05-30: 40 mL

## 2015-05-30 MED ORDER — FENTANYL CITRATE (PF) 100 MCG/2ML IJ SOLN
INTRAMUSCULAR | Status: AC
  Filled 2015-05-30: qty 2

## 2015-05-30 MED ORDER — LIDOCAINE HCL (CARDIAC) 20 MG/ML IV SOLN
INTRAVENOUS | Status: DC | PRN
  Administered 2015-05-30: 80 mg via INTRAVENOUS

## 2015-05-30 NOTE — Op Note (Signed)
[facility] [place], [address]   ERCP PROCEDURE REPORT        EXAM DATE: Jun 17, 2015  PATIENT NAME:          [name], [name]          MR #: [id number] BIRTHDATE:       18-Aug-1945     VISIT #:     [phone] ATTENDING:     Carol Ada, MD     STATUS:     outpatient ASSISTANT:      Elspeth Cho and Hilma Favors  INDICATIONS:  The patient is a 69 yr old male here for an ERCP due to stent change. PROCEDURE PERFORMED:     ERCP with stent placement MEDICATIONS:     General anesthesia  CONSENT: The patient understands the risks and benefits of the procedure and understands that these risks include, but are not limited to: sedation, allergic reaction, infection, perforation and/or bleeding. Alternative means of evaluation and treatment include, among others: physical exam, x-rays, and/or surgical intervention. The patient elects to proceed with this endoscopic procedure.  DESCRIPTION OF PROCEDURE: During intra-op preparation period all mechanical & medical equipment was checked for proper function. Hand hygiene and appropriate measures for infection prevention was taken. After the risks, benefits and alternatives of the procedure were thoroughly explained, Informed was verified, confirmed and timeout was successfully executed by the treatment team. With the patient in left semi-prone position, medications were administered intravenously.The    was passed from the mouth into the esophagus and further advanced from the esophagus into the stomach. From stomach scope was directed to the second portion of the duodenum. Major papilla was aligned with the duodenoscope. The scope position was confirmed fluoroscopically. Rest of the findings/therapeutics are given below. The scope was then completely withdrawn from the patient and the procedure completed. The pulse, BP, and O2 saturation were monitored and documented by the physician and  the nursing staff throughout the entire procedure. The patient was cared for as planned according to standard protocol. The patient was then discharged to recovery in stable condition and with appropriate post procedure care. Estimated blood loss is zero unless otherwise noted in this procedure report.  The scout image revealed no change with the position of the stent. The ampulla was located the second portion of the duodenum with excellent stent positioning.  The guidewire was inserted with the spinchterotome and secured in the right intrahepatic ducts.  The plastic biliary stent was removed with a snare.  Contrast injection revealed minimal dilation of the CBD and intrahepatic bile ducts. There was the mild focal stenosis in the proximal CBD approximately 1 cm below the bifurcation.  10 mm x 60 mm covered metallic biliary stent was deployed.  Care was taken not to occlude the left hepatic duct.  Confirmation of patency was obtained with injection of contrast into the biliary stent.  Excellent drainage was obtained.     ADVERSE EVENT:     There were no complications. IMPRESSIONS:     1) Mild proximal CBD stricture s/p metallic stent placement.  RECOMMENDATIONS:     1) Recheck liver panel in one week.  If the jaundice persists, then it is not a function of a large bile duct obstruction REPEAT EXAM:   ___________________________________ Carol Ada, MD eSigned:  Carol Ada, MD 06/17/15 1:28 PM  cc:  CPT CODES: ICD9 CODES:

## 2015-05-30 NOTE — Anesthesia Procedure Notes (Signed)
Procedure Name: Intubation Date/Time: 05/30/2015 12:35 PM Performed by: Carleene Cooper A Pre-anesthesia Checklist: Patient identified, Timeout performed, Emergency Drugs available, Suction available and Patient being monitored Patient Re-evaluated:Patient Re-evaluated prior to inductionOxygen Delivery Method: Circle system utilized Preoxygenation: Pre-oxygenation with 100% oxygen Intubation Type: IV induction Ventilation: Mask ventilation without difficulty Laryngoscope Size: Mac and 4 Grade View: Grade I Tube type: Oral Tube size: 7.5 mm Number of attempts: 1 Airway Equipment and Method: Stylet Placement Confirmation: breath sounds checked- equal and bilateral,  ETT inserted through vocal cords under direct vision and positive ETCO2 Secured at: 21 cm Tube secured with: Tape Dental Injury: Teeth and Oropharynx as per pre-operative assessment

## 2015-05-30 NOTE — Anesthesia Postprocedure Evaluation (Signed)
Anesthesia Post Note  Patient: [name]  Procedure(s) Performed: Procedure(s) (LRB): ENDOSCOPIC RETROGRADE CHOLANGIOPANCREATOGRAPHY (ERCP) (N/A)  Patient location during evaluation: Endoscopy Anesthesia Type: General Level of consciousness: awake and alert Pain management: pain level controlled Vital Signs Assessment: post-procedure vital signs reviewed and stable Respiratory status: spontaneous breathing, nonlabored ventilation, respiratory function stable and patient connected to nasal cannula oxygen Cardiovascular status: blood pressure returned to baseline and stable Postop Assessment: no signs of nausea or vomiting Anesthetic complications: no    Last Vitals:  Filed Vitals:   05/30/15 1345 05/30/15 1350  BP:    Pulse: 81 84  Temp:    Resp: 17 12    Last Pain: There were no vitals filed for this visit.               Catalina Gravel

## 2015-05-30 NOTE — H&P (View-Only) (Signed)
[name] has a tube feeding at 50 ml per hour, water at 45 ml per hour.  [name] was not able to tolerate the goal rate of 65 ml of tube feeding per hour.   Tube feeding and water will be stopped at midnight, Thursday.  Patient's wife [name] reported that patient can flush medication and tube with 30 ml of water.

## 2015-05-30 NOTE — Transfer of Care (Signed)
Immediate Anesthesia Transfer of Care Note  Patient: [name]  Procedure(s) Performed: Procedure(s): ENDOSCOPIC RETROGRADE CHOLANGIOPANCREATOGRAPHY (ERCP) (N/A)  Patient Location: PACU and Endoscopy Unit  Anesthesia Type:General  Level of Consciousness: awake, alert , oriented and patient cooperative  Airway & Oxygen Therapy: Patient Spontanous Breathing and Patient connected to face mask oxygen  Post-op Assessment: Report given to RN, Post -op Vital signs reviewed and stable and Patient moving all extremities  Post vital signs: Reviewed and stable  Last Vitals:  Filed Vitals:   05/30/15 1130  BP: 97/71  Pulse: 93  Temp: 36.4 C  Resp: 22    Complications: No apparent anesthesia complications

## 2015-05-30 NOTE — Interval H&P Note (Signed)
History and Physical Interval Note:  05/30/2015 12:18 PM  [name]  has presented today for surgery, with the diagnosis of stent placement  The various methods of treatment have been discussed with the patient and family. After consideration of risks, benefits and other options for treatment, the patient has consented to  Procedure(s): ENDOSCOPIC RETROGRADE CHOLANGIOPANCREATOGRAPHY (ERCP) (N/A) as a surgical intervention .  The patient's history has been reviewed, patient examined, no change in status, stable for surgery.  I have reviewed the patient's chart and labs.  Questions were answered to the patient's satisfaction.     Takuma Cifelli D

## 2015-05-30 NOTE — Anesthesia Preprocedure Evaluation (Addendum)
Anesthesia Evaluation  Patient identified by MRN, date of birth, ID band Patient awake    Reviewed: Allergy & Precautions, NPO status , Patient's Chart, lab work & pertinent test results  History of Anesthesia Complications Negative for: history of anesthetic complications  Airway Mallampati: II  TM Distance: <3 FB Neck ROM: Full    Dental  (+) Dental Advisory Given, Partial Upper, Missing   Pulmonary shortness of breath and with exertion, pneumonia, resolved, former smoker,  Non-small cell lung cancer s/p radiation, chemotherapy   Pulmonary exam normal breath sounds clear to auscultation       Cardiovascular Exercise Tolerance: Poor hypertension, (-) angina(-) CAD and (-) Past MI Normal cardiovascular exam Rhythm:Regular Rate:Normal  Hypotension-on midodrine   Neuro/Psych Vertigo negative psych ROS   GI/Hepatic GERD  Medicated,Jaundice, elevated bilirubin s/p biliary stent 10/2014 Dysphagia s/p G-tube 10/2014   Endo/Other  negative endocrine ROS  Renal/GU negative Renal ROS     Musculoskeletal  (+) Arthritis , Osteoarthritis,    Abdominal   Peds  Hematology  (+) Blood dyscrasia, anemia ,   Anesthesia Other Findings Day of surgery medications reviewed with the patient.  Sepsis in January 2017  Reproductive/Obstetrics                            Anesthesia Physical Anesthesia Plan  ASA: III  Anesthesia Plan: General   Post-op Pain Management:    Induction: Intravenous  Airway Management Planned: Oral ETT  Additional Equipment:   Intra-op Plan:   Post-operative Plan: Extubation in OR  Informed Consent: I have reviewed the patients History and Physical, chart, labs and discussed the procedure including the risks, benefits and alternatives for the proposed anesthesia with the patient or authorized representative who has indicated his/her understanding and acceptance.   Dental  advisory given  Plan Discussed with: CRNA  Anesthesia Plan Comments: (Risks/benefits of general anesthesia discussed with patient including risk of damage to teeth, lips, gum, and tongue, nausea/vomiting, allergic reactions to medications, and the possibility of heart attack, stroke and death.  All patient questions answered.  Patient wishes to proceed.)        Anesthesia Quick Evaluation

## 2015-06-02 ENCOUNTER — Encounter (HOSPITAL_COMMUNITY): Payer: Self-pay | Admitting: Gastroenterology

## 2015-06-05 ENCOUNTER — Encounter: Payer: Self-pay | Admitting: Internal Medicine

## 2015-06-05 NOTE — Progress Notes (Signed)
I sent to [facility] again for ondansetron prior auth

## 2015-06-06 ENCOUNTER — Encounter: Payer: Self-pay | Admitting: Internal Medicine

## 2015-06-06 NOTE — Progress Notes (Signed)
Resent ondansetron prior auth req--[place]..[facility] could not find patient???

## 2015-06-09 ENCOUNTER — Encounter: Payer: Self-pay | Admitting: Internal Medicine

## 2015-06-09 ENCOUNTER — Telehealth: Payer: Self-pay | Admitting: Medical Oncology

## 2015-06-09 NOTE — Telephone Encounter (Signed)
Per Local pharmacist, Pt received 90 day supply on 06/05/15 written by Dr Collene Mares

## 2015-06-09 NOTE — Progress Notes (Signed)
[facility]- they can't find patient either. We need to get correct insurance info for prescriptions.

## 2015-06-18 ENCOUNTER — Other Ambulatory Visit: Payer: Self-pay | Admitting: Medical Oncology

## 2015-06-18 ENCOUNTER — Telehealth: Payer: Self-pay | Admitting: *Deleted

## 2015-06-18 NOTE — Telephone Encounter (Signed)
Wife called and left message that patient needs a PAC flush. In reviewing patient's chart, he has not been to see Dr. [name] since September 2016 and has no future appts.  His last PAC flush here was 03/21/15 (although it may have been used in January 2017 with hospital admission).  Will send this request to Dr. [name] and his RN to schedule necessary appointments.  Call back for patient is [phone].

## 2015-06-19 ENCOUNTER — Other Ambulatory Visit: Payer: Self-pay | Admitting: *Deleted

## 2015-06-19 ENCOUNTER — Telehealth: Payer: Self-pay | Admitting: *Deleted

## 2015-06-19 ENCOUNTER — Ambulatory Visit (HOSPITAL_COMMUNITY)
Admission: RE | Admit: 2015-06-19 | Discharge: 2015-06-19 | Disposition: A | Discharge status: Home / Self Care | Payer: Medicare Other | Source: Ambulatory Visit | Attending: Internal Medicine | Admitting: Internal Medicine

## 2015-06-19 ENCOUNTER — Other Ambulatory Visit (HOSPITAL_COMMUNITY): Payer: Self-pay | Admitting: Interventional Radiology

## 2015-06-19 ENCOUNTER — Ambulatory Visit: Payer: Medicare Other

## 2015-06-19 ENCOUNTER — Other Ambulatory Visit: Payer: Self-pay | Admitting: Internal Medicine

## 2015-06-19 ENCOUNTER — Telehealth: Payer: Self-pay | Admitting: Internal Medicine

## 2015-06-19 ENCOUNTER — Ambulatory Visit (HOSPITAL_BASED_OUTPATIENT_CLINIC_OR_DEPARTMENT_OTHER): Payer: Medicare Other

## 2015-06-19 ENCOUNTER — Other Ambulatory Visit: Payer: Self-pay | Admitting: Medical Oncology

## 2015-06-19 DIAGNOSIS — C3492 Malignant neoplasm of unspecified part of left bronchus or lung: Secondary | ICD-10-CM

## 2015-06-19 DIAGNOSIS — Z95828 Presence of other vascular implants and grafts: Secondary | ICD-10-CM

## 2015-06-19 DIAGNOSIS — Z452 Encounter for adjustment and management of vascular access device: Secondary | ICD-10-CM

## 2015-06-19 MED ORDER — SODIUM CHLORIDE 0.9% FLUSH
10.0000 mL | INTRAVENOUS | Status: DC | PRN
Start: 2015-06-19 — End: 2015-06-19
  Administered 2015-06-19: 10 mL via INTRAVENOUS
  Filled 2015-06-19: qty 10

## 2015-06-19 MED ORDER — HEPARIN SOD (PORK) LOCK FLUSH 100 UNIT/ML IV SOLN
500.0000 [IU] | Freq: Once | INTRAVENOUS | Status: AC
  Administered 2015-06-19: 500 [IU] via INTRAVENOUS
  Filled 2015-06-19: qty 5

## 2015-06-19 NOTE — Progress Notes (Signed)
When cleaning patients port site for access, I noticed a black spot the size of a pencil eraser in the center of the site.  Called the infusion room for someone else to look at the site before I attempted to access it.  Freada Bergeron RN and Elray Buba RN came and looked at and agreed that it should not be accessed.  Dr [name] suggested calling interventional radiation to have it evaluated.  Called IR and was told to send patient over.  Lab appt cancelled and will be rescheduled by Patient at another time.  Flush will be rescheduled if IR finds it appropriate.

## 2015-06-19 NOTE — Patient Instructions (Signed)

## 2015-06-19 NOTE — Telephone Encounter (Signed)
s.w. pt wife and advised on todays appt...Marland Kitchenhe will come by todays to extend sched for flushes Deno Lunger

## 2015-06-19 NOTE — Telephone Encounter (Signed)
Call received in [place] from pt's wife stating pt is scheduled for port flush at 4pm today.  She states pt was recently treated for blood infection and had elevated liver enzymes which was being monitored by his GI doctor, " I do not know if Dr [name] was aware of this "  Per call wife is requesting labs to be done from port to monitor the above.  This request will be sent to MD and RN at desk.

## 2015-06-19 NOTE — Progress Notes (Signed)
Patient ID: [name], male   DOB: February 02, 1946, 70 y.o.   MRN: [id number] Pt sent to IR today to eval rt chest wall PAC due to presence of a black spot over port hub. Port was placed in 2010. Pt no longer receives chemotherapy via port but it is used occasionally for IV access if needed. Denies fevers, chills, pain at port site or drainage. On exam pt's overlying skin at port site is very thin with exposed central hub portion . There is no significant erythema but few scattered capillaries are injected. No visible drainage or edema. Pt seen by Dr. Vernard Gambles and plan is for port removal at [facility] on 3/24. Plans d/w pt/wife and Dr. [name].

## 2015-06-20 ENCOUNTER — Encounter: Payer: Self-pay | Admitting: Physical Therapy

## 2015-06-20 ENCOUNTER — Other Ambulatory Visit (HOSPITAL_COMMUNITY): Payer: Self-pay | Admitting: Interventional Radiology

## 2015-06-20 ENCOUNTER — Other Ambulatory Visit: Payer: Self-pay | Admitting: Radiology

## 2015-06-20 ENCOUNTER — Ambulatory Visit (HOSPITAL_COMMUNITY)
Admission: RE | Admit: 2015-06-20 | Discharge: 2015-06-20 | Disposition: A | Discharge status: Home / Self Care | Payer: Medicare Other | Source: Ambulatory Visit | Attending: Interventional Radiology | Admitting: Interventional Radiology

## 2015-06-20 ENCOUNTER — Encounter (HOSPITAL_COMMUNITY): Payer: Self-pay

## 2015-06-20 DIAGNOSIS — Z888 Allergy status to other drugs, medicaments and biological substances status: Secondary | ICD-10-CM | POA: Insufficient documentation

## 2015-06-20 DIAGNOSIS — C772 Secondary and unspecified malignant neoplasm of intra-abdominal lymph nodes: Secondary | ICD-10-CM

## 2015-06-20 DIAGNOSIS — Z800 Family history of malignant neoplasm of digestive organs: Secondary | ICD-10-CM | POA: Diagnosis not present

## 2015-06-20 DIAGNOSIS — Z885 Allergy status to narcotic agent status: Secondary | ICD-10-CM | POA: Insufficient documentation

## 2015-06-20 DIAGNOSIS — Z87891 Personal history of nicotine dependence: Secondary | ICD-10-CM | POA: Diagnosis not present

## 2015-06-20 DIAGNOSIS — Z923 Personal history of irradiation: Secondary | ICD-10-CM | POA: Insufficient documentation

## 2015-06-20 DIAGNOSIS — Z79899 Other long term (current) drug therapy: Secondary | ICD-10-CM | POA: Diagnosis not present

## 2015-06-20 DIAGNOSIS — Z9221 Personal history of antineoplastic chemotherapy: Secondary | ICD-10-CM | POA: Insufficient documentation

## 2015-06-20 DIAGNOSIS — I10 Essential (primary) hypertension: Secondary | ICD-10-CM | POA: Diagnosis not present

## 2015-06-20 DIAGNOSIS — R269 Unspecified abnormalities of gait and mobility: Secondary | ICD-10-CM | POA: Insufficient documentation

## 2015-06-20 DIAGNOSIS — Z931 Gastrostomy status: Secondary | ICD-10-CM | POA: Diagnosis not present

## 2015-06-20 DIAGNOSIS — Z452 Encounter for adjustment and management of vascular access device: Secondary | ICD-10-CM | POA: Insufficient documentation

## 2015-06-20 DIAGNOSIS — Z95828 Presence of other vascular implants and grafts: Secondary | ICD-10-CM

## 2015-06-20 DIAGNOSIS — R42 Dizziness and giddiness: Secondary | ICD-10-CM | POA: Insufficient documentation

## 2015-06-20 DIAGNOSIS — C3490 Malignant neoplasm of unspecified part of unspecified bronchus or lung: Secondary | ICD-10-CM | POA: Insufficient documentation

## 2015-06-20 DIAGNOSIS — Z8249 Family history of ischemic heart disease and other diseases of the circulatory system: Secondary | ICD-10-CM | POA: Diagnosis not present

## 2015-06-20 DIAGNOSIS — Z881 Allergy status to other antibiotic agents status: Secondary | ICD-10-CM | POA: Diagnosis not present

## 2015-06-20 LAB — BASIC METABOLIC PANEL
Anion gap: 11 (ref 5–15)
BUN: 31 mg/dL — AB (ref 6–20)
CALCIUM: 10.5 mg/dL — AB (ref 8.9–10.3)
CO2: 26 mmol/L (ref 22–32)
CREATININE: 1.13 mg/dL (ref 0.61–1.24)
Chloride: 102 mmol/L (ref 101–111)
GFR calc Af Amer: >60 mL/min (ref >60)
GFR calc non Af Amer: >60 mL/min (ref >60)
GLUCOSE: 117 mg/dL — AB (ref 65–99)
Potassium: 4.7 mmol/L (ref 3.5–5.1)
Sodium: 139 mmol/L (ref 135–145)

## 2015-06-20 LAB — CBC
HCT: 40.4 % (ref 39.0–52.0)
Hemoglobin: 13.7 g/dL (ref 13.0–17.0)
MCH: 36.2 pg — AB (ref 26.0–34.0)
MCHC: 33.9 g/dL (ref 30.0–36.0)
MCV: 106.9 fL — ABNORMAL HIGH (ref 78.0–100.0)
PLATELETS: 162 10*3/uL (ref 150–400)
RBC: 3.78 MIL/uL — ABNORMAL LOW (ref 4.22–5.81)
RDW: 14.3 % (ref 11.5–15.5)
WBC: 10.4 10*3/uL (ref 4.0–10.5)

## 2015-06-20 LAB — PROTIME-INR
INR: 1.1 (ref 0.00–1.49)
PROTHROMBIN TIME: 14.4 s (ref 11.6–15.2)

## 2015-06-20 LAB — APTT: APTT: 30 s (ref 24–37)

## 2015-06-20 MED ORDER — SODIUM CHLORIDE 0.9 % IV SOLN
INTRAVENOUS | Status: AC | PRN
  Administered 2015-06-20: 10 mL/h via INTRAVENOUS

## 2015-06-20 MED ORDER — FENTANYL CITRATE (PF) 100 MCG/2ML IJ SOLN
INTRAMUSCULAR | Status: AC | PRN
  Administered 2015-06-20: 50 ug via INTRAVENOUS

## 2015-06-20 MED ORDER — SODIUM CHLORIDE 0.9 % IV SOLN: INTRAVENOUS | Status: DC

## 2015-06-20 MED ORDER — MIDAZOLAM HCL 2 MG/2ML IJ SOLN
INTRAMUSCULAR | Status: AC
  Filled 2015-06-20: qty 2

## 2015-06-20 MED ORDER — MIDAZOLAM HCL 2 MG/2ML IJ SOLN
INTRAMUSCULAR | Status: AC | PRN
  Administered 2015-06-20: 1 mg via INTRAVENOUS

## 2015-06-20 MED ORDER — FENTANYL CITRATE (PF) 100 MCG/2ML IJ SOLN
INTRAMUSCULAR | Status: AC
  Filled 2015-06-20: qty 2

## 2015-06-20 MED ORDER — VANCOMYCIN HCL IN DEXTROSE 1-5 GM/200ML-% IV SOLN
1000.0000 mg | INTRAVENOUS | Status: AC
  Administered 2015-06-20: 1000 mg via INTRAVENOUS
  Filled 2015-06-20 (×2): qty 200

## 2015-06-20 MED ORDER — LIDOCAINE HCL 1 % IJ SOLN
INTRAMUSCULAR | Status: AC
  Filled 2015-06-20: qty 20

## 2015-06-20 NOTE — Procedures (Signed)
Successful removal of right anterior chest wall port-a-cath. EBL: None No immediate post procedural complications.   Ronny Bacon, MD Pager #: [phone]

## 2015-06-20 NOTE — Discharge Instructions (Signed)

## 2015-06-20 NOTE — Therapy (Signed)
[facility] [address] [place], [place], [address] Phone: [phone]   Fax:  [phone]  Patient Details  Name: [name] MRN: [id number] Date of Birth: September 20, 1945 Referring Provider:  Margette Fast, MD  Encounter Date: 06/20/2015  PHYSICAL THERAPY DISCHARGE SUMMARY  Visits from Start of Care: 9  Current functional level related to goals / functional outcomes: Unknown, as patient did not return to PT after he as hospitalized on 04/23/15.      PT Long Term Goals - 04/22/15 1500    PT LONG TERM GOAL #1   Title Pt will perform initial HEP with mod I using paper handout to maximize functional gains made in PT. Target date: 04/25/15   Baseline All unmet goals will be carried over to new POC: 04/27/15   Status On-going   PT LONG TERM GOAL #2   Title Rule out orthostatic hypotension as origin of symptoms. Target date: 04/11/15   Status Achieved   PT LONG TERM GOAL #3   Title Pt/wife will report pt ablity to tolerate being upright (seated) for > 50% of 12-hour day. Target date: 04/25/15   Baseline Pt currently tolerates being upright for ~10% of day.   Status Not Met   PT LONG TERM GOAL #4   Title Pt will ambulate 85' over level, indoor surfaces with supervision using LRAD with no more 2-point increase in subjective symptoms to indicate increased pt tolerance to household ambulation. Target date: 04/11/15   Baseline 12/23: gait x125' with min guard, initial 49' with RW, reamining distance without AD. Dizziness/lightheadedness decreased from 5/10 to 4/10.   Status Achieved   PT LONG TERM GOAL #5   Title Pt will decrease DHI score from 76% to 58% to indicate significant decrease in pt-perceived disability due to symptoms of lightheadedness. Target date: 04/25/15   Status On-going   PT LONG TERM GOAL #6   Title Pt will ambulate 400' over even/uneven terrain at MOD I level with LRAD, with no more than 2-point increase in  dizziness to improve functional mobility. Target date: 04/25/15   Status On-going       Remaining deficits: Unknown, as patient did not return to PT after he as hospitalized on 04/23/15.   Education / Equipment: HEP; education on postural hypotension; program for increasing upright tolerance; walking program Plan: Patient agrees to discharge.  Patient goals were not met. Patient is being discharged due to a change in medical status.  ?????        Billie Ruddy, PT, DPT [facility] [address] [place], [place], [address] Phone: [phone]   Fax:  [phone] 06/20/2015, 11:35 AM

## 2015-06-20 NOTE — Sedation Documentation (Signed)
Patient is resting comfortably. 

## 2015-06-20 NOTE — Sedation Documentation (Signed)
Patient denies pain and is resting comfortably.  

## 2015-06-20 NOTE — H&P (Signed)
Chief Complaint: Patient was seen in consultation today for port a cath removal at the request of Dr [name]  Referring Physician(s): Dr [name]  Supervising Physician: Sandi Mariscal  History of Present Illness: [name] is a 70 y.o. male   PAC placed 2010 Dr Vernard Gambles [facility]  Last access 05/14/15 for antibiotics   Has been used since then without issue Most recently only as needed Has never had technical issue with Port  Noticed "black dot" center of PAC approximately 1 yr ago Never noted infection or fever Has been used without issue since then Scabbed are larger as of few weeks  Presented to ED yesterday for evaluation Now scheduled for removal per Dr Vernard Gambles and Dr [name]  Past Medical History  Diagnosis Date  . Hypertension   . Arthritis RIGHT SHOULDER  . Immature cataract BILATERAL  . Acute meniscal tear of knee RIGHT KNEE  . BPH (benign prostatic hypertrophy)   . Dizziness and giddiness 04/23/2014  . Tremor 04/23/2014    Right hand  . HOH (hard of hearing)     bilateral hearing aids  . Radiation 01/08/09-01/21/09    Mediastinum 30 Gy x 12 fractions  . Radiation 08/19/14-09/04/14    Palliative RT Porta hepatic LN 30 Gy  . Pneumonia   . Abnormality of gait 03/12/2015  . Shortness of breath dyspnea     with exertion  . Orthostatic hypotension     B/P tends to run 110/70 or less -using med to raise B/P.  Marland Kitchen ARF (acute renal failure) ([place]) 03/2015    pre-renal patient was septic  . 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 Swallowing impairment     "Unable to swallow"  . Non-small cell lung cancer ([place]) DX SEPT 2010  W/ CHEMORADIATION AT THAT TIME -- NOW  W/ METS--  CURRENTLY ON MAINTENANCE CHEMO TX  EVERY 33 DAYS    ONCOLOGIST- DR [facility]- lung cancer with metastasis.    Past Surgical History  Procedure Laterality Date  . Rotator cuff repair  2008    LEFT SHOULDER  . Bilateral  ear drum surgery  1960'S  . Orif left ring finger  and revascularization of radial side  02-16-2007    DEGLOVING INJURY  . Knee arthroscopy  12/21/2011    Procedure: ARTHROSCOPY KNEE;  Surgeon: Tobi Bastos, MD;  Location[facility];  Service: Orthopedics;  Laterality: Right;  WITH MEDIAL MENISECTOMY  . Cataract extraction Bilateral   . Tonsillectomy      as child  . Shoulder open rotator cuff repair Right 06/05/2014    Procedure: RIGHT ROTATOR CUFF REPAIR SHOULDER OPEN;  Surgeon: Latanya Maudlin, MD;  Location: [facility];  Service: Orthopedics;  Laterality: Right;  . Ercp N/A 08/09/2014    Procedure: ENDOSCOPIC RETROGRADE CHOLANGIOPANCREATOGRAPHY (ERCP);  Surgeon: Carol Ada, MD;  Location: [facility];  Service: Endoscopy;  Laterality: N/A;  . Eus N/A 08/09/2014    Procedure: UPPER ENDOSCOPIC ULTRASOUND (EUS) RADIAL;  Surgeon: Carol Ada, MD;  Location: [facility];  Service: Endoscopy;  Laterality: N/A;  . Fine needle aspiration N/A 08/09/2014    Procedure: FINE NEEDLE ASPIRATION (FNA) LINEAR;  Surgeon: Carol Ada, MD;  Location: [facility];  Service: Endoscopy;  Laterality: N/A;  . Esophagogastroduodenoscopy (egd) with propofol N/A 08/09/2014    Procedure: ESOPHAGOGASTRODUODENOSCOPY (EGD) WITH PROPOFOL;  Surgeon: Carol Ada, MD;  Location: [facility];  Service: Endoscopy;  Laterality: N/A;  .  Bile duct stent placement    . Gastrostomy tube placement  10/2014  . [place] cath    . Laryngoscopy  01/2015 and 04/21/15  . Ercp N/A 05/23/2015    Procedure: ENDOSCOPIC RETROGRADE CHOLANGIOPANCREATOGRAPHY (ERCP);  Surgeon: Carol Ada, MD;  Location: [facility];  Service: Endoscopy;  Laterality: N/A;  . Portacath placement      as of 05-27-15 remains right chest  . Ercp N/A 05/30/2015    Procedure: ENDOSCOPIC RETROGRADE CHOLANGIOPANCREATOGRAPHY (ERCP);  Surgeon: Carol Ada, MD;  Location: [facility];  Service: Endoscopy;  Laterality: N/A;    Allergies: Augmentin;  Carboplatin; Levaquin; Percocet; Scopolamine; and Zolpidem tartrate  Medications: Prior to Admission medications   Medication Sig Start Date End Date Taking? Authorizing Provider  b complex vitamins tablet Take 1 tablet by mouth daily.   Yes Historical Provider, MD  diphenhydramine-acetaminophen (TYLENOL PM) 25-500 MG TABS tablet Take 1 tablet by mouth at bedtime as needed (sleep.).   Yes Historical Provider, MD  LORazepam (ATIVAN) 1 MG tablet Take 1 mg by mouth at bedtime.   Yes Historical Provider, MD  midodrine (PROAMATINE) 5 MG tablet Take 5 mg by mouth 3 (three) times daily with meals.   Yes Historical Provider, MD  Multiple Vitamin (MULTIVITAMIN) tablet Take 1 tablet by mouth every morning.    Yes Historical Provider, MD  mupirocin ointment (BACTROBAN) 2 % Apply 1 application topically 3 (three) times daily. Around g tube area 05/02/15  Yes Historical Provider, MD  Nutritional Supplements (FEEDING SUPPLEMENT, OSMOLITE 1.5 CAL,) LIQD Begin Osmolite 1.5 with 2.0 at 40 cc/ hr continuosly current at 40 cc/ hr for now and gradually advance to 65 cc/ hr which is current goal via PEG tube. ( may take a week or wo to reach his goal)   Flush feeding tube with 240 cc free water 6 times daily. Patient taking differently: 1,185 mLs by Gastric Tube route See admin instructions. Begin Osmolite 1.5 with 2.0 at 50cc/ hr continuosly current cc.  Could not tolerate 73m /hr   45 ml sterile water per hour 04/29/15  Yes Nishant Dhungel, MD  ondansetron (ZOFRAN) 8 MG tablet Take 1 tablet (8 mg total) by mouth every 8 (eight) hours as needed for nausea or vomiting. 05/14/15  Yes MCurt Bears MD  promethazine (PHENERGAN) 25 MG tablet Take 25 mg by mouth every 6 (six) hours as needed for nausea or vomiting.   Yes Historical Provider, MD  prochlorperazine (COMPAZINE) 10 MG tablet Take 1 tablet (10 mg total) by mouth every 6 (six) hours as needed for nausea or vomiting. 10/15/14   MCurt Bears MD     Family  History  Problem Relation Age of Onset  . Colon cancer    . Heart attack    . Cancer    . Hypertension    . Hyperlipidemia    . Congestive Heart Failure Mother   . Cancer Sister   . Cancer Maternal Grandfather   . Colon cancer Father   . Heart attack Brother     Social History   Social History  . Marital Status: Married    Spouse Name: N/A  . Number of Children: 3  . Years of Education: college   Occupational History  . ELECTRICIAN     Retired   Social History Main Topics  . Smoking status: Former Smoker -- 2.00 packs/day for 20 years    Types: Cigarettes    Quit date: 03/17/1981  . Smokeless tobacco: Never Used  . Alcohol Use:  No     Comment: beer daily  . Drug Use: No  . Sexual Activity: Not Asked   Other Topics Concern  . None   Social History Narrative   Patient is right handed.   Patient drinks 1-2 cups caffeine daily.     Review of Systems: A 12 point ROS discussed and pertinent positives are indicated in the HPI above.  All other systems are negative.  Review of Systems  Constitutional: Negative for fever, diaphoresis, activity change and appetite change.  Respiratory: Negative for shortness of breath.   Neurological: Negative for weakness.  Psychiatric/Behavioral: Negative for behavioral problems and confusion.    Vital Signs: BP 115/94 mmHg  Temp(Src) 98 F (36.7 C) (Oral)  Resp 23  Ht '5\' 8"'$  (1.727 m)  Wt 114 lb (51.71 kg)  BMI 17.34 kg/m2  SpO2 100%  Physical Exam  Constitutional: He is oriented to person, place, and time.  Cardiovascular: Normal rate, regular rhythm and normal heart sounds.   Pulmonary/Chest: Effort normal and breath sounds normal.  Abdominal: Soft. Bowel sounds are normal. There is no tenderness.  Musculoskeletal: Normal range of motion.  Neurological: He is alert and oriented to person, place, and time.  Skin: Skin is warm and dry.  Site of PAC has scabbed area in center NT no bleeding  no infection  Skin  otherwise intact  Psychiatric: He has a normal mood and affect. His behavior is normal. Judgment and thought content normal.  Nursing note and vitals reviewed.   Mallampati Score:  MD Evaluation Airway: WNL Heart: WNL Abdomen: WNL Chest/ Lungs: WNL ASA  Classification: 3 Mallampati/Airway Score: One  Imaging: Dg Ercp  05/30/2015  CLINICAL DATA:  Biliary stricture EXAM: ERCP TECHNIQUE: Multiple spot images obtained with the fluoroscopic device and submitted for interpretation post-procedure. COMPARISON:  ERCP -05/22/2025 ; abdominal MRI -04/27/2025 FINDINGS: 8 spot intraoperative radiographic images the right upper abdominal quadrant during ERCP are provided for review. Initial image demonstrates a plastic biliary stent overlying the right upper abdominal quadrant. Subsequent images demonstrate selective cannulation and opacification of the CBD. Following removal of the plastic biliary stent, there is placement of a internal metallic biliary stent with central aspect appearing to terminate at the level of the biliary hilar bifurcation. IMPRESSION: ERCP with removal all plastic biliary stent and placement of metallic internal biliary stent. These images were submitted for radiologic interpretation only. Please see the procedural report for the amount of contrast and the fluoroscopy time utilized. Electronically Signed   By: Sandi Mariscal M.D.   On: 05/30/2015 14:04   Dg Ercp  05/23/2015  CLINICAL DATA:  ERCP; STENT PREVIOUSLY PLACED BY IR HAD SLIPPED OUT OF POSITION AND WAS REMOVED. PLASTIC STENT WAS PLACED TEMPORARILY FOR METAL STENT TO BE REPLACED NEXT WEEK RSTO: [place] FLUORO TIME: 1 MIN 36 SECS EXAM: ERCP TECHNIQUE: Multiple spot images obtained with the fluoroscopic device and submitted for interpretation post-procedure. FLUOROSCOPY TIME:  1 MINUTES 36 SECONDS COMPARISON:  MR 04/28/2015 and previous FINDINGS: A series of fluoroscopic spot images document endoscopic cannulation and opacification of  the CBD with plastic biliary stent placement. The intrahepatic ducts are incompletely opacified, appearing decompressed centrally. IMPRESSION: 1. Endoscopic biliary stent placement. These images were submitted for radiologic interpretation only. Please see the procedural report for the amount of contrast and the fluoroscopy time utilized. Electronically Signed   By: Lucrezia Europe M.D.   On: 05/23/2015 14:25    Labs:  CBC:  Recent Labs  04/24/15 0415 04/26/15 9323  05/23/15 0813 06/20/15 0735  WBC 16.5* 6.9 9.5 10.4  HGB 10.2* 10.2* 12.9* 13.7  HCT 30.1* 30.1* 38.5* 40.4  PLT 137* 150 170 162    COAGS:  Recent Labs  08/23/14 1045 09/05/14 1330 11/06/14 0838 11/26/14 0300  INR 1.33 1.32 1.20 1.36  APTT  --  30  --  38*    BMP:  Recent Labs  04/27/15 0527 04/28/15 0607 04/29/15 0448 05/23/15 0813  NA 143 144 141 138  K 3.6 3.9 3.4* 4.5  CL 113* 110 108 102  CO2 '22 24 24 23  '$ GLUCOSE 153* 107* 133* 109*  BUN 20 19 23* 28*  CALCIUM 8.9 9.4 9.1 10.2  CREATININE 0.82 0.61 0.64 0.96  GFRNONAA >60 >60 >60 >60  GFRAA >60 >60 >60 >60    LIVER FUNCTION TESTS:  Recent Labs  04/26/15 0638 04/27/15 0527 04/28/15 0607 04/29/15 0448  BILITOT 5.3* 5.9* 7.8* 7.7*  AST 84* 161* 150* 108*  ALT 182* 253* 261* 203*  ALKPHOS 381* 433* 445* 446*  PROT 5.5* 5.7* 6.0* 5.7*  ALBUMIN 2.5* 2.7* 2.7* 2.5*    TUMOR MARKERS: No results for input(s): AFPTM, CEA, CA199, CHROMGRNA in the last 8760 hours.  Assessment and Plan:  PAC placed 2010 Now with scabbed area in center---enlarging For removal per Oncology Risks and benefits were discussed with pt; including but not limited to: Infection; vessel damage; bleeding; damage to surrounding structure Agreeable to proceed Consent signed and in chart  Thank you for this interesting consult.  I greatly enjoyed meeting Jakolby Sedivy and look forward to participating in their care.  A copy of this report was sent to the requesting  provider on this date.  Electronically Signed: Yug Loria A 06/20/2015, 8:03 AM   I spent a total of  30 Minutes   in face to face in clinical consultation, greater than 50% of which was counseling/coordinating care for [facility] removal

## 2015-06-23 ENCOUNTER — Ambulatory Visit (HOSPITAL_COMMUNITY)
Admission: RE | Admit: 2015-06-23 | Discharge: 2015-06-23 | Disposition: A | Discharge status: Home / Self Care | Payer: Medicare Other | Source: Ambulatory Visit | Attending: Interventional Radiology | Admitting: Interventional Radiology

## 2015-06-23 DIAGNOSIS — Z85118 Personal history of other malignant neoplasm of bronchus and lung: Secondary | ICD-10-CM | POA: Insufficient documentation

## 2015-06-23 DIAGNOSIS — Z4801 Encounter for change or removal of surgical wound dressing: Secondary | ICD-10-CM | POA: Diagnosis present

## 2015-06-23 DIAGNOSIS — Z880 Allergy status to penicillin: Secondary | ICD-10-CM | POA: Diagnosis not present

## 2015-06-23 DIAGNOSIS — C772 Secondary and unspecified malignant neoplasm of intra-abdominal lymph nodes: Secondary | ICD-10-CM

## 2015-06-23 NOTE — Care Management Note (Signed)
Case Management Note  Patient Details  Name: [name] MRN: [id number] Date of Birth: Apr 16, 1945  Subjective/Objective:                    Action/Plan:   Expected Discharge Date:                  Expected Discharge Plan:  [place]  In-House Referral:  NA  Discharge planning Services  CM Consult  Post Acute Care Choice:  Home Health Choice offered to:  Patient, Spouse  DME Arranged:  N/A DME Agency:  NA  HH Arranged:  RN [place] Agency:  [place]  Status of Service:  Completed, signed off  Medicare Important Message Given:    Date Medicare IM Given:    Medicare IM give by:    Date Additional Medicare IM Given:    Additional Medicare Important Message give by:     If discussed at [place] of Stay Meetings, dates discussed:    Additional Comments:  Fuller Mandril, RN 06/23/2015, 11:59 AM

## 2015-06-23 NOTE — Progress Notes (Deleted)
Patient had PAC removed by Dr. Pascal Lux on 3/24 and the wound was packed with 1/4' iodoform gauze.  He presents today to have this removed. His wound is clean and there is no evidence of infection.  His skin is intact with no erythema present. The packing is removed and NS WD packing and dressing replaced.  This was taught to his wife as it was being done.  I have contacted [facility] as well for an RN to teach the wife at home how to do this.  I have also contacted the ED CM to set this up.  The patient will follow up with Dr. Pascal Lux in the clinic in 2 weeks for a wound check.  The wife and patient understand all of this.  Rai Severns E 11:18 AM 06/23/2015

## 2015-06-23 NOTE — Progress Notes (Signed)
Referring Physician(s): Watts,John  Supervising Physician: Aletta Edouard  Chief Complaint: PAC removed and wound packed  Subjective: Patient doing well at home since Friday.  No complaints  Allergies: Augmentin; Carboplatin; Levaquin; Percocet; Scopolamine; and Zolpidem tartrate  Medications: Prior to Admission medications   Medication Sig Start Date End Date Taking? Authorizing Provider  b complex vitamins tablet Take 1 tablet by mouth daily.    Historical Provider, MD  diphenhydramine-acetaminophen (TYLENOL PM) 25-500 MG TABS tablet Take 1 tablet by mouth at bedtime as needed (sleep.).    Historical Provider, MD  LORazepam (ATIVAN) 1 MG tablet Take 1 mg by mouth at bedtime.    Historical Provider, MD  midodrine (PROAMATINE) 5 MG tablet Take 5 mg by mouth 3 (three) times daily with meals.    Historical Provider, MD  Multiple Vitamin (MULTIVITAMIN) tablet Take 1 tablet by mouth every morning.     Historical Provider, MD  mupirocin ointment (BACTROBAN) 2 % Apply 1 application topically 3 (three) times daily. Around g tube area 05/02/15   Historical Provider, MD  Nutritional Supplements (FEEDING SUPPLEMENT, OSMOLITE 1.5 CAL,) LIQD Begin Osmolite 1.5 with 2.0 at 40 cc/ hr continuosly current at 40 cc/ hr for now and gradually advance to 65 cc/ hr which is current goal via PEG tube. ( may take a week or wo to reach his goal)   Flush feeding tube with 240 cc free water 6 times daily. Patient taking differently: 1,185 mLs by Gastric Tube route See admin instructions. Begin Osmolite 1.5 with 2.0 at 50cc/ hr continuosly current cc.  Could not tolerate 25m /hr   45 ml sterile water per hour 04/29/15   Nishant Dhungel, MD  ondansetron (ZOFRAN) 8 MG tablet Take 1 tablet (8 mg total) by mouth every 8 (eight) hours as needed for nausea or vomiting. 05/14/15   MCurt Bears MD  prochlorperazine (COMPAZINE) 10 MG tablet Take 1 tablet (10 mg total) by mouth every 6 (six) hours as needed for  nausea or vomiting. 10/15/14   MCurt Bears MD  promethazine (PHENERGAN) 25 MG tablet Take 25 mg by mouth every 6 (six) hours as needed for nausea or vomiting.    Historical Provider, MD    Vital Signs: There were no vitals taken for this visit.  Physical Exam: Chest: 1/4in iodoform packing removed.  Skin is intact with no erythema.  Wound bed is clean and no purulent drainage.  WD packing replaced.  Imaging: Ir Removal T[employer] CorporationW/ Port W/o Fl Mod Sed  06/20/2015  CLINICAL DATA:  History of lung cancer. Patient with history of right internal jugular approach port a catheter (placed by Dr. HVernard Gambleson 01/16/2009. The Port a catheter has functioned well throughout duration of usage though then now there is concern for exposure of the Port a catheter reservoir secondary to overlying skin erosion. Patient currently is not undergoing chemotherapy and as such, the decision was made to remove the port a catheter. EXAM: REMOVAL OF IMPLANTED TUNNELED PORT-A-CATH MEDICATIONS: Vancomycin 1 g IV: The antibiotic was administered within 1 hour prior to the start of the procedure. ANESTHESIA/SEDATION: Moderate (conscious) sedation was employed during this procedure. A total of Versed 1 mg and Fentanyl 50 mcg was administered intravenously. Moderate Sedation Time: 12 minutes. The patient's level of consciousness and vital signs were monitored continuously by radiology nursing throughout the procedure under my direct supervision. FLUOROSCOPY TIME:  None PROCEDURE: Informed written consent was obtained from the patient after a discussion of the risk, benefits  and alternatives to the procedure. The patient was positioned supine on the fluoroscopy table and the right chest Port-A-Cath site was prepped with chlorhexidine. A sterile gown and gloves were worn during the procedure. Local anesthesia was provided with 1% lidocaine with epinephrine. A timeout was performed prior to the initiation of the procedure. Inspection  of the [place] a catheter reservoir site demonstrated a discrete erosion measuring approximately 4 mm with exposure of the Port a catheter reservoir hub. An incision was made overlying the Port-A-Cath with a #15 scalpel. Utilizing sharp and blunt dissection, the Port-A-Cath was removed completely. The pocked was irrigated with sterile saline confirming complete erosion of the overlying skin surface. Given exposure of the Port a catheter reservoir, the decision was made not to perform primary closure of the port reservoir removal site. Rather, the wound was packed with iodoform gauze. A dressing was placed. The patient tolerated the procedure well without immediate post procedural complication. FINDINGS: Successful removal of implant Port-A-Cath without immediate post procedural complication. Confirmed breakdown of the skin overlying the Port a catheter reservoir with Port a Catheter exposure. IMPRESSION: 1. Successful removal of implanted Port-A-Cath. 2. Given breakdown of skin overlying the Port a catheter reservoir, the removal site was packed with iodoform gauze. PLAN: The patient will return to the interventional radiology the department on Monday (4/27) for removal of the iodoform gauze and conversion to standard gauze and Tegaderm dressing. Electronically Signed   By: Sandi Mariscal M.D.   On: 06/20/2015 10:53    Labs:  CBC:  Recent Labs  04/24/15 0415 04/26/15 0638 05/23/15 0813 06/20/15 0735  WBC 16.5* 6.9 9.5 10.4  HGB 10.2* 10.2* 12.9* 13.7  HCT 30.1* 30.1* 38.5* 40.4  PLT 137* 150 170 162    COAGS:  Recent Labs  09/05/14 1330 11/06/14 0838 11/26/14 0300 06/20/15 0735  INR 1.32 1.20 1.36 1.10  APTT 30  --  38* 30    BMP:  Recent Labs  04/28/15 0607 04/29/15 0448 05/23/15 0813 06/20/15 0735  NA 144 141 138 139  K 3.9 3.4* 4.5 4.7  CL 110 108 102 102  CO2 '24 24 23 26  '$ GLUCOSE 107* 133* 109* 117*  BUN 19 23* 28* 31*  CALCIUM 9.4 9.1 10.2 10.5*  CREATININE 0.61 0.64 0.96  1.13  GFRNONAA >60 >60 >60 >60  GFRAA >60 >60 >60 >60    LIVER FUNCTION TESTS:  Recent Labs  04/26/15 0638 04/27/15 0527 04/28/15 0607 04/29/15 0448  BILITOT 5.3* 5.9* 7.8* 7.7*  AST 84* 161* 150* 108*  ALT 182* 253* 261* 203*  ALKPHOS 381* 433* 445* 446*  PROT 5.5* 5.7* 6.0* 5.7*  ALBUMIN 2.5* 2.7* 2.7* 2.5*    Assessment and Plan: 1. S/p PAC removal with packing 3/24 by Pascal Lux -wound is clean -this was repacked today and taught to the wife, but she would prefer some HH be set up as well -this is currently being arranged with advanced home care -he will RTC in 2 weeks to see Dr. Pascal Lux for a wound check.  Electronically Signed: Henreitta Cea 06/23/2015, 11:18 AM   I spent a total of 25 Minutes at the the patient's bedside AND on the patient's hospital floor or unit, greater than 50% of which was counseling/coordinating care for wound check , s/p PAC removal

## 2015-06-24 ENCOUNTER — Telehealth: Payer: Self-pay | Admitting: *Deleted

## 2015-06-24 NOTE — Telephone Encounter (Signed)
lmom to schedule f/u appt w/ Dr Johnney Killian

## 2015-07-03 ENCOUNTER — Other Ambulatory Visit (HOSPITAL_COMMUNITY): Payer: Self-pay | Admitting: General Surgery

## 2015-07-03 ENCOUNTER — Ambulatory Visit
Admission: RE | Admit: 2015-07-03 | Discharge: 2015-07-03 | Disposition: A | Discharge status: Home / Self Care | Payer: Medicare Other | Source: Ambulatory Visit | Attending: General Surgery | Admitting: General Surgery

## 2015-07-03 ENCOUNTER — Other Ambulatory Visit (HOSPITAL_COMMUNITY): Payer: Self-pay | Admitting: Interventional Radiology

## 2015-07-03 DIAGNOSIS — C772 Secondary and unspecified malignant neoplasm of intra-abdominal lymph nodes: Secondary | ICD-10-CM

## 2015-07-03 NOTE — Progress Notes (Signed)
Referring Physician(s): [name]  Chief Complaint: Recheck chest wound S/p removal of port a cath 06/20/2015 by Dr. Pascal Lux  History of present illness:  [name] is here today to recheck his open right chest wound after he had his Port A cath removed by Dr. Pascal Lux on 06/20/2015  He originally had it placed 2010 by Dr Vernard Gambles for chemotherapy to treat Lung Cancer  He continued to use it as needed and it was last accessed 05/14/15 for antibiotics   He had never had technical issue with the [facility]  He noticed a black dot at the port site about a year ago but continued to use it without issue.  Then he noticed this dot become larger over the past few weeks.  This prompted him to call our department and be seen in IR at [facility].    The Port was found to be exposed due to break down of the skin overlying it.    He underwent removal the next day.    Past Medical History  Diagnosis Date  . Hypertension   . Arthritis RIGHT SHOULDER  . Immature cataract BILATERAL  . Acute meniscal tear of knee RIGHT KNEE  . BPH (benign prostatic hypertrophy)   . Dizziness and giddiness 04/23/2014  . Tremor 04/23/2014    Right hand  . HOH (hard of hearing)     bilateral hearing aids  . Radiation 01/08/09-01/21/09    Mediastinum 30 Gy x 12 fractions  . Radiation 08/19/14-09/04/14    Palliative RT Porta hepatic LN 30 Gy  . Pneumonia   . Abnormality of gait 03/12/2015  . Shortness of breath dyspnea     with exertion  . Orthostatic hypotension     B/P tends to run 110/70 or less -using med to raise B/P.  Marland Kitchen ARF (acute renal failure) ([place]) 03/2015    pre-renal patient was septic  . 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 Swallowing impairment     "Unable to swallow"  . Non-small cell lung cancer ([place]) DX SEPT 2010  W/ CHEMORADIATION AT THAT TIME -- NOW  W/ METS--  CURRENTLY ON MAINTENANCE CHEMO TX  EVERY 27 DAYS    ONCOLOGIST- DR  [facility]- lung cancer with metastasis.    Past Surgical History  Procedure Laterality Date  . Rotator cuff repair  2008    LEFT SHOULDER  . Bilateral ear drum surgery  1960'S  . Orif left ring finger  and revascularization of radial side  02-16-2007    DEGLOVING INJURY  . Knee arthroscopy  12/21/2011    Procedure: ARTHROSCOPY KNEE;  Surgeon: Tobi Bastos, MD;  Location: [facility];  Service: Orthopedics;  Laterality: Right;  WITH MEDIAL MENISECTOMY  . Cataract extraction Bilateral   . Tonsillectomy      as child  . Shoulder open rotator cuff repair Right 06/05/2014    Procedure: RIGHT ROTATOR CUFF REPAIR SHOULDER OPEN;  Surgeon: Latanya Maudlin, MD;  Location: [facility];  Service: Orthopedics;  Laterality: Right;  . Ercp N/A 08/09/2014    Procedure: ENDOSCOPIC RETROGRADE CHOLANGIOPANCREATOGRAPHY (ERCP);  Surgeon: Carol Ada, MD;  Location: [facility];  Service: Endoscopy;  Laterality: N/A;  . Eus N/A 08/09/2014    Procedure: UPPER ENDOSCOPIC ULTRASOUND (EUS) RADIAL;  Surgeon: Carol Ada, MD;  Location: [facility];  Service: Endoscopy;  Laterality: N/A;  . Fine needle aspiration N/A 08/09/2014    Procedure: FINE NEEDLE ASPIRATION (FNA) LINEAR;  Surgeon: Carol Ada, MD;  Location: [facility];  Service: Endoscopy;  Laterality: N/A;  . Esophagogastroduodenoscopy (egd) with propofol N/A 08/09/2014    Procedure: ESOPHAGOGASTRODUODENOSCOPY (EGD) WITH PROPOFOL;  Surgeon: Carol Ada, MD;  Location: [facility];  Service: Endoscopy;  Laterality: N/A;  . Bile duct stent placement    . Gastrostomy tube placement  10/2014  . [place] cath    . Laryngoscopy  01/2015 and 04/21/15  . Ercp N/A 05/23/2015    Procedure: ENDOSCOPIC RETROGRADE CHOLANGIOPANCREATOGRAPHY (ERCP);  Surgeon: Carol Ada, MD;  Location: [facility];  Service: Endoscopy;  Laterality: N/A;  . Portacath placement      as of 05-27-15 remains right chest  . Ercp N/A 05/30/2015    Procedure: ENDOSCOPIC RETROGRADE  CHOLANGIOPANCREATOGRAPHY (ERCP);  Surgeon: Carol Ada, MD;  Location: [facility];  Service: Endoscopy;  Laterality: N/A;    Allergies: Augmentin; Carboplatin; Levaquin; Percocet; Scopolamine; and Zolpidem tartrate  Medications: Prior to Admission medications   Medication Sig Start Date End Date Taking? Authorizing Provider  b complex vitamins tablet Take 1 tablet by mouth daily.   Yes Historical Provider, MD  diphenhydramine-acetaminophen (TYLENOL PM) 25-500 MG TABS tablet Take 1 tablet by mouth at bedtime as needed (sleep.).   Yes Historical Provider, MD  LORazepam (ATIVAN) 1 MG tablet Take 1 mg by mouth at bedtime.   Yes Historical Provider, MD  midodrine (PROAMATINE) 5 MG tablet Take 5 mg by mouth 3 (three) times daily with meals.   Yes Historical Provider, MD  Multiple Vitamin (MULTIVITAMIN) tablet Take 1 tablet by mouth every morning.    Yes Historical Provider, MD  mupirocin ointment (BACTROBAN) 2 % Apply 1 application topically 3 (three) times daily. Around g tube area 05/02/15  Yes Historical Provider, MD  Nutritional Supplements (FEEDING SUPPLEMENT, OSMOLITE 1.5 CAL,) LIQD Begin Osmolite 1.5 with 2.0 at 40 cc/ hr continuosly current at 40 cc/ hr for now and gradually advance to 65 cc/ hr which is current goal via PEG tube. ( may take a week or wo to reach his goal)   Flush feeding tube with 240 cc free water 6 times daily. Patient taking differently: 1,185 mLs by Gastric Tube route See admin instructions. Begin Osmolite 1.5 with 2.0 at 50cc/ hr continuosly current cc.  Could not tolerate 54m /hr   45 ml sterile water per hour 04/29/15  Yes Nishant Dhungel, MD  ondansetron (ZOFRAN) 8 MG tablet Take 1 tablet (8 mg total) by mouth every 8 (eight) hours as needed for nausea or vomiting. 05/14/15  Yes MCurt Bears MD  prochlorperazine (COMPAZINE) 10 MG tablet Take 1 tablet (10 mg total) by mouth every 6 (six) hours as needed for nausea or vomiting. 10/15/14  Yes MCurt Bears MD    promethazine (PHENERGAN) 25 MG tablet Take 25 mg by mouth every 6 (six) hours as needed for nausea or vomiting.   Yes Historical Provider, MD     Family History  Problem Relation Age of Onset  . Colon cancer    . Heart attack    . Cancer    . Hypertension    . Hyperlipidemia    . Congestive Heart Failure Mother   . Cancer Sister   . Cancer Maternal Grandfather   . Colon cancer Father   . Heart attack Brother     Social History   Social History  . Marital Status: Married    Spouse Name: N/A  . Number of Children: 3  . Years of Education: college   Occupational  History  . ELECTRICIAN     Retired   Social History Main Topics  . Smoking status: Former Smoker -- 2.00 packs/day for 20 years    Types: Cigarettes    Quit date: 03/17/1981  . Smokeless tobacco: Never Used  . Alcohol Use: No     Comment: beer daily  . Drug Use: No  . Sexual Activity: Not on file   Other Topics Concern  . Not on file   Social History Narrative   Patient is right handed.   Patient drinks 1-2 cups caffeine daily.     Vital Signs: BP 89/63 mmHg  Pulse 84  Resp 14  Ht '5\' 8"'$  (1.727 m)  Wt 115 lb (52.164 kg)  BMI 17.49 kg/m2  SpO2 98%  Physical Exam  Awake and Alert NAD Thin Right chest wound clean Healthy pink granulation tissue in the wound bed Inferior aspect of the wound still has ann open area No signs of infection. Measures about 1.5 cms in circumference and tunnels inferiorly about 5 mm  Imaging: No results found.  Labs:  CBC:  Recent Labs  04/24/15 0415 04/26/15 0638 05/23/15 0813 06/20/15 0735  WBC 16.5* 6.9 9.5 10.4  HGB 10.2* 10.2* 12.9* 13.7  HCT 30.1* 30.1* 38.5* 40.4  PLT 137* 150 170 162    COAGS:  Recent Labs  09/05/14 1330 11/06/14 0838 11/26/14 0300 06/20/15 0735  INR 1.32 1.20 1.36 1.10  APTT 30  --  38* 30    BMP:  Recent Labs  04/28/15 0607 04/29/15 0448 05/23/15 0813 06/20/15 0735  NA 144 141 138 139  K 3.9 3.4* 4.5 4.7   CL 110 108 102 102  CO2 '24 24 23 26  '$ GLUCOSE 107* 133* 109* 117*  BUN 19 23* 28* 31*  CALCIUM 9.4 9.1 10.2 10.5*  CREATININE 0.61 0.64 0.96 1.13  GFRNONAA >60 >60 >60 >60  GFRAA >60 >60 >60 >60    LIVER FUNCTION TESTS:  Recent Labs  04/26/15 0638 04/27/15 0527 04/28/15 0607 04/29/15 0448  BILITOT 5.3* 5.9* 7.8* 7.7*  AST 84* 161* 150* 108*  ALT 182* 253* 261* 203*  ALKPHOS 381* 433* 445* 446*  PROT 5.5* 5.7* 6.0* 5.7*  ALBUMIN 2.5* 2.7* 2.7* 2.5*    Assessment:  Removal of Port A Cath 06/20/2015 secondary to eroded skin.  Dr. Pascal Lux recommended stop packing and cover with clean gauze daily.  Recheck in 2 weeks.  If wound no progressing, will consider referral to [facility].  Signed: Murrell Redden PA-C 07/03/2015, 11:45 AM   Please refer to Dr. Pascal Lux attestation of this note for management and plan.

## 2015-07-08 ENCOUNTER — Ambulatory Visit (INDEPENDENT_AMBULATORY_CARE_PROVIDER_SITE_OTHER): Payer: Medicare Other | Admitting: Neurology

## 2015-07-08 ENCOUNTER — Encounter: Payer: Self-pay | Admitting: Neurology

## 2015-07-08 VITALS — BP 104/64 | HR 80 | Ht 68.0 in | Wt 119.5 lb

## 2015-07-08 DIAGNOSIS — R42 Dizziness and giddiness: Secondary | ICD-10-CM | POA: Diagnosis not present

## 2015-07-08 DIAGNOSIS — R269 Unspecified abnormalities of gait and mobility: Secondary | ICD-10-CM | POA: Diagnosis not present

## 2015-07-08 NOTE — Progress Notes (Signed)
Reason for visit: Dizziness  [name] is an 70 y.o. male  History of present illness:  [name] is a 70 year old right-handed white male with a history of lung cancer that is metastatic. The patient has developed dizziness in April 2015. The patient was found to have a Pseudomonas ear infection in June 2015 requiring antibiotic therapy, and the patient had worsening dizziness following a pneumonia in August 2016. The patient has dizziness that comes on with sitting and standing, he does not report true vertigo but it is more of a lightheaded floaty feeling. When he is lying down, he has no dizziness. He has had orthostatic hypotension, he has been treated with midodrine for this. He has not had any syncopal events. The patient has been tried on several medications including diazepam, meclizine, Transderm scope patch, and Topamax without benefit. The patient has undergone vestibular rehabilitation without improvement. He has been seen by Dr. Polly Cobia from ENT. He returns to the office today indicating that he is no better following vestibular therapy or trial on Topamax. He is limited with his ability to perform physical activities secondary to the dizziness. The returns to this office for an evaluation. He walks with a walker. He has not had any falls since Thanksgiving of 2016.  Past Medical History  Diagnosis Date  . Hypertension   . Arthritis RIGHT SHOULDER  . Immature cataract BILATERAL  . Acute meniscal tear of knee RIGHT KNEE  . BPH (benign prostatic hypertrophy)   . Dizziness and giddiness 04/23/2014  . Tremor 04/23/2014    Right hand  . HOH (hard of hearing)     bilateral hearing aids  . Radiation 01/08/09-01/21/09    Mediastinum 30 Gy x 12 fractions  . Radiation 08/19/14-09/04/14    Palliative RT Porta hepatic LN 30 Gy  . Pneumonia   . Abnormality of gait 03/12/2015  . Shortness of breath dyspnea     with exertion  . Orthostatic hypotension     B/P tends to run  110/70 or less -using med to raise B/P.  Marland Kitchen ARF (acute renal failure) ([place]) 03/2015    pre-renal patient was septic  . 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 Swallowing impairment     "Unable to swallow"  . Non-small cell lung cancer ([place]) DX SEPT 2010  W/ CHEMORADIATION AT THAT TIME -- NOW  W/ METS--  CURRENTLY ON MAINTENANCE CHEMO TX  EVERY 28 DAYS    ONCOLOGIST- DR [facility]- lung cancer with metastasis.  . Sepsis ([place]) 03/2015    Past Surgical History  Procedure Laterality Date  . Rotator cuff repair  2008    LEFT SHOULDER  . Bilateral ear drum surgery  1960'S  . Orif left ring finger  and revascularization of radial side  02-16-2007    DEGLOVING INJURY  . Knee arthroscopy  12/21/2011    Procedure: ARTHROSCOPY KNEE;  Surgeon: Tobi Bastos, MD;  Location: [facility];  Service: Orthopedics;  Laterality: Right;  WITH MEDIAL MENISECTOMY  . Cataract extraction Bilateral   . Tonsillectomy      as child  . Shoulder open rotator cuff repair Right 06/05/2014    Procedure: RIGHT ROTATOR CUFF REPAIR SHOULDER OPEN;  Surgeon: Latanya Maudlin, MD;  Location: [facility];  Service: Orthopedics;  Laterality: Right;  . Ercp N/A 08/09/2014    Procedure: ENDOSCOPIC RETROGRADE CHOLANGIOPANCREATOGRAPHY (ERCP);  Surgeon: Carol Ada, MD;  Location: [facility];  Service:  Endoscopy;  Laterality: N/A;  . Eus N/A 08/09/2014    Procedure: UPPER ENDOSCOPIC ULTRASOUND (EUS) RADIAL;  Surgeon: Carol Ada, MD;  Location: [facility];  Service: Endoscopy;  Laterality: N/A;  . Fine needle aspiration N/A 08/09/2014    Procedure: FINE NEEDLE ASPIRATION (FNA) LINEAR;  Surgeon: Carol Ada, MD;  Location: [facility];  Service: Endoscopy;  Laterality: N/A;  . Esophagogastroduodenoscopy (egd) with propofol N/A 08/09/2014    Procedure: ESOPHAGOGASTRODUODENOSCOPY (EGD) WITH PROPOFOL;  Surgeon: Carol Ada, MD;  Location[facility];  Service:  Endoscopy;  Laterality: N/A;  . Bile duct stent placement    . Gastrostomy tube placement  10/2014  . [place] cath    . Laryngoscopy  01/2015 and 04/21/15  . Ercp N/A 05/23/2015    Procedure: ENDOSCOPIC RETROGRADE CHOLANGIOPANCREATOGRAPHY (ERCP);  Surgeon: Carol Ada, MD;  Location: [facility] ENDOSCOPY;  Service: Endoscopy;  Laterality: N/A;  . Portacath placement  Removed 06/20/3015    as of 05-27-15 remains right chest  . Ercp N/A 05/30/2015    Procedure: ENDOSCOPIC RETROGRADE CHOLANGIOPANCREATOGRAPHY (ERCP);  Surgeon: Carol Ada, MD;  Location: [facility];  Service: Endoscopy;  Laterality: N/A;    Family History  Problem Relation Age of Onset  . Colon cancer    . Heart attack    . Cancer    . Hypertension    . Hyperlipidemia    . Congestive Heart Failure Mother   . Cancer Sister   . Cancer Maternal Grandfather   . Colon cancer Father   . Heart attack Brother     Social history:  reports that he quit smoking about 34 years ago. His smoking use included Cigarettes. He has a 40 pack-year smoking history. He has never used smokeless tobacco. He reports that he does not drink alcohol or use illicit drugs.    Allergies  Allergen Reactions  . Augmentin [Amoxicillin-Pot Clavulanate] Diarrhea    .Has patient had a PCN reaction causing immediate rash, facial/tongue/throat swelling, SOB or lightheadedness with hypotension: No Has patient had a PCN reaction causing severe rash involving mucus membranes or skin necrosis: No Has patient had a PCN reaction that required hospitalization No Has patient had a PCN reaction occurring within the last 10 years: No If all of the above answers are "NO", then may proceed with Cephalosporin use.   . Carboplatin Other (See Comments)    Rash and itching after carbo test dose  . Levaquin [Levofloxacin] Other (See Comments)    Knee pain  . Percocet [Oxycodone-Acetaminophen] Nausea And Vomiting  . Scopolamine Other (See Comments)    hallucinations  .  Zolpidem Tartrate Diarrhea    Medications:  Prior to Admission medications   Medication Sig Start Date End Date Taking? Authorizing Provider  b complex vitamins tablet Take 1 tablet by mouth daily.    Historical Provider, MD  diphenhydramine-acetaminophen (TYLENOL PM) 25-500 MG TABS tablet Take 1 tablet by mouth at bedtime as needed (sleep.).    Historical Provider, MD  LORazepam (ATIVAN) 1 MG tablet Take 1 mg by mouth at bedtime.    Historical Provider, MD  midodrine (PROAMATINE) 5 MG tablet Take 5 mg by mouth 3 (three) times daily with meals.    Historical Provider, MD  Multiple Vitamin (MULTIVITAMIN) tablet Take 1 tablet by mouth every morning.     Historical Provider, MD  mupirocin ointment (BACTROBAN) 2 % Apply 1 application topically 3 (three) times daily. Around g tube area 05/02/15   Historical Provider, MD  Nutritional Supplements (FEEDING SUPPLEMENT, OSMOLITE 1.5  CAL,) LIQD Begin Osmolite 1.5 with 2.0 at 40 cc/ hr continuosly current at 40 cc/ hr for now and gradually advance to 65 cc/ hr which is current goal via PEG tube. ( may take a week or wo to reach his goal)   Flush feeding tube with 240 cc free water 6 times daily. Patient taking differently: 1,185 mLs by Gastric Tube route See admin instructions. Begin Osmolite 1.5 with 2.0 at 50cc/ hr continuosly current cc.  Could not tolerate 60m /hr   45 ml sterile water per hour 04/29/15   Nishant Dhungel, MD  ondansetron (ZOFRAN) 8 MG tablet Take 1 tablet (8 mg total) by mouth every 8 (eight) hours as needed for nausea or vomiting. 05/14/15   MCurt Bears MD  prochlorperazine (COMPAZINE) 10 MG tablet Take 1 tablet (10 mg total) by mouth every 6 (six) hours as needed for nausea or vomiting. 10/15/14   MCurt Bears MD  promethazine (PHENERGAN) 25 MG tablet Take 25 mg by mouth every 6 (six) hours as needed for nausea or vomiting.    Historical Provider, MD    ROS:  Out of a complete 14 system review of symptoms, the patient  complains only of the following symptoms, and all other reviewed systems are negative.  Shortness of breath Frequency of urination Back pain, walking difficulty Dizziness Nausea  Blood pressure 104/64, pulse 80, height '5\' 8"'$  (1.727 m), weight 119 lb 8 oz (54.205 kg).   Blood pressure, standing, left arm is 78 systolic. Blood pressure, sitting, left arm is 1081systolic.  Physical Exam  General: The patient is alert and cooperative at the time of the examination.  Skin: No significant peripheral edema is noted.   Neurologic Exam  Mental status: The patient is alert and oriented x 3 at the time of the examination. The patient has apparent normal recent and remote memory, with an apparently normal attention span and concentration ability.   Cranial nerves: Facial symmetry is present. Speech is normal, no aphasia or dysarthria is noted. Extraocular movements are full. Visual fields are full.  Motor: The patient has good strength in all 4 extremities.  Sensory examination: Soft touch sensation is symmetric on the face, arms, and legs.  Coordination: The patient has good finger-nose-finger and heel-to-shin bilaterally.  Gait and station: The patient has a wide-based, unsteady gait. The patient uses a walker for ambulation. Tandem gait was not attempted. Romberg is negative. No drift is seen.  Reflexes: Deep tendon reflexes are symmetric.   Assessment/Plan:  1. Chronic dizziness  2. Orthostatic hypotension  The patient has had significant problems with dizziness following a Pseudomonas ear infection, the patient likely has sustained neural injury. He has not responded to physical therapy or medication treatments. [facility]. The patient will be referred to Dr. KAmado Coe  CJill AlexandersMD 07/08/2015 8:09 PM  [facility] [address] [id number] Phone [phone]  [phone]

## 2015-07-17 ENCOUNTER — Ambulatory Visit
Admission: RE | Admit: 2015-07-17 | Discharge: 2015-07-17 | Disposition: A | Discharge status: Home / Self Care | Payer: Medicare Other | Source: Ambulatory Visit | Attending: Interventional Radiology | Admitting: Interventional Radiology

## 2015-07-17 DIAGNOSIS — C772 Secondary and unspecified malignant neoplasm of intra-abdominal lymph nodes: Secondary | ICD-10-CM

## 2015-07-17 NOTE — Progress Notes (Signed)
Patient ID: [name], male   DOB: [birth year], 70 y.o.   MRN: [id number]        Chief Complaint: Wound management following port a catheter removal  Referring Physician(s): Mohamed  History of Present Illness: [name] is a 70 y.o. male with history of lung cancer, post port a catheter removal on 06/20/2015. He returns today to the interventional radiology clinic for wound check.  Patient was seen at the interventional radiology clinic on 07/03/2015 at which time, the patient was instructed to no longer pack the portacatheter reservoir explantation site, and rather to simply applied gauze and Tegaderm dressing to allow for healing by secondary intention.  The patient is accompanied by his wife and serves as his own historian.  The patient is without complaint. No fever or chills. No increasing pain or discomfort related to the portacatheter explantation site.  The patient does report a minimal amount of pain surrounding his chronic gastrostomy tube.  Past Medical History  Diagnosis Date  . Hypertension   . Arthritis RIGHT SHOULDER  . Immature cataract BILATERAL  . Acute meniscal tear of knee RIGHT KNEE  . BPH (benign prostatic hypertrophy)   . Dizziness and giddiness 04/23/2014  . Tremor 04/23/2014    Right hand  . HOH (hard of hearing)     bilateral hearing aids  . Radiation 01/08/09-01/21/09    Mediastinum 30 Gy x 12 fractions  . Radiation 08/19/14-09/04/14    Palliative RT Porta hepatic LN 30 Gy  . Pneumonia   . Abnormality of gait 03/12/2015  . Shortness of breath dyspnea     with exertion  . Orthostatic hypotension     B/P tends to run 110/70 or less -using med to raise B/P.  Marland Kitchen ARF (acute renal failure) ([place]) 03/2015    pre-renal patient was septic  . 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 Swallowing impairment     "Unable to swallow"  . Non-small cell lung cancer ([place]) DX SEPT 2010  W/  CHEMORADIATION AT THAT TIME -- NOW  W/ METS--  CURRENTLY ON MAINTENANCE CHEMO TX  EVERY 13 DAYS    ONCOLOGIST- DR [facility]- lung cancer with metastasis.  . Sepsis ([place]) 03/2015    Past Surgical History  Procedure Laterality Date  . Rotator cuff repair  2008    LEFT SHOULDER  . Bilateral ear drum surgery  1960'S  . Orif left ring finger  and revascularization of radial side  02-16-2007    DEGLOVING INJURY  . Knee arthroscopy  12/21/2011    Procedure: ARTHROSCOPY KNEE;  Surgeon: Tobi Bastos, MD;  Location: [facility];  Service: Orthopedics;  Laterality: Right;  WITH MEDIAL MENISECTOMY  . Cataract extraction Bilateral   . Tonsillectomy      as child  . Shoulder open rotator cuff repair Right 06/05/2014    Procedure: RIGHT ROTATOR CUFF REPAIR SHOULDER OPEN;  Surgeon: Latanya Maudlin, MD;  Location: [facility];  Service: Orthopedics;  Laterality: Right;  . Ercp N/A 08/09/2014    Procedure: ENDOSCOPIC RETROGRADE CHOLANGIOPANCREATOGRAPHY (ERCP);  Surgeon: Carol Ada, MD;  Location: [facility];  Service: Endoscopy;  Laterality: N/A;  . Eus N/A 08/09/2014    Procedure: UPPER ENDOSCOPIC ULTRASOUND (EUS) RADIAL;  Surgeon: Carol Ada, MD;  Location: [facility];  Service: Endoscopy;  Laterality: N/A;  . Fine needle aspiration N/A 08/09/2014    Procedure: FINE NEEDLE ASPIRATION (FNA) LINEAR;  Surgeon: Carol Ada, MD;  Location:  [facility];  Service: Endoscopy;  Laterality: N/A;  . Esophagogastroduodenoscopy (egd) with propofol N/A 08/09/2014    Procedure: ESOPHAGOGASTRODUODENOSCOPY (EGD) WITH PROPOFOL;  Surgeon: Carol Ada, MD;  Location: [facility];  Service: Endoscopy;  Laterality: N/A;  . Bile duct stent placement    . Gastrostomy tube placement  10/2014  . [place] cath    . Laryngoscopy  01/2015 and 04/21/15  . Ercp N/A 05/23/2015    Procedure: ENDOSCOPIC RETROGRADE CHOLANGIOPANCREATOGRAPHY (ERCP);  Surgeon: Carol Ada, MD;  Location: [facility] ENDOSCOPY;  Service: Endoscopy;   Laterality: N/A;  . Portacath placement  Removed 06/20/3015    as of 05-27-15 remains right chest  . Ercp N/A 05/30/2015    Procedure: ENDOSCOPIC RETROGRADE CHOLANGIOPANCREATOGRAPHY (ERCP);  Surgeon: Carol Ada, MD;  Location: [facility];  Service: Endoscopy;  Laterality: N/A;    Allergies: Augmentin; Carboplatin; Levaquin; Percocet; Scopolamine; and Zolpidem tartrate  Medications: Prior to Admission medications   Medication Sig Start Date End Date Taking? Authorizing Provider  b complex vitamins tablet Take 1 tablet by mouth daily.    Historical Provider, MD  diphenhydramine-acetaminophen (TYLENOL PM) 25-500 MG TABS tablet Take 1 tablet by mouth at bedtime as needed (sleep.).    Historical Provider, MD  LORazepam (ATIVAN) 1 MG tablet Take 1 mg by mouth at bedtime.    Historical Provider, MD  midodrine (PROAMATINE) 5 MG tablet Take 5 mg by mouth 3 (three) times daily with meals.    Historical Provider, MD  Multiple Vitamin (MULTIVITAMIN) tablet Take 1 tablet by mouth every morning.     Historical Provider, MD  mupirocin ointment (BACTROBAN) 2 % Apply 1 application topically 3 (three) times daily. Around g tube area 05/02/15   Historical Provider, MD  Nutritional Supplements (FEEDING SUPPLEMENT, OSMOLITE 1.5 CAL,) LIQD Begin Osmolite 1.5 with 2.0 at 40 cc/ hr continuosly current at 40 cc/ hr for now and gradually advance to 65 cc/ hr which is current goal via PEG tube. ( may take a week or wo to reach his goal)   Flush feeding tube with 240 cc free water 6 times daily. Patient taking differently: 1,185 mLs by Gastric Tube route See admin instructions. Begin Osmolite 1.5 with 2.0 at 50cc/ hr continuosly current cc.  Could not tolerate 57m /hr   45 ml sterile water per hour 04/29/15   Nishant Dhungel, MD  ondansetron (ZOFRAN) 8 MG tablet Take 1 tablet (8 mg total) by mouth every 8 (eight) hours as needed for nausea or vomiting. 05/14/15   MCurt Bears MD  prochlorperazine (COMPAZINE) 10 MG  tablet Take 1 tablet (10 mg total) by mouth every 6 (six) hours as needed for nausea or vomiting. 10/15/14   MCurt Bears MD  promethazine (PHENERGAN) 25 MG tablet Take 25 mg by mouth every 6 (six) hours as needed for nausea or vomiting.    Historical Provider, MD     Family History  Problem Relation Age of Onset  . Colon cancer    . Heart attack    . Cancer    . Hypertension    . Hyperlipidemia    . Congestive Heart Failure Mother   . Cancer Sister   . Cancer Maternal Grandfather   . Colon cancer Father   . Heart attack Brother     Social History   Social History  . Marital Status: Married    Spouse Name: N/A  . Number of Children: 3  . Years of Education: college   Occupational History  . ELECTRICIAN  Retired   Social History Main Topics  . Smoking status: Former Smoker -- 2.00 packs/day for 20 years    Types: Cigarettes    Quit date: 03/17/1981  . Smokeless tobacco: Never Used  . Alcohol Use: No  . Drug Use: No  . Sexual Activity: Not on file   Other Topics Concern  . Not on file   Social History Narrative   Lives with his wife, [name]   Patient is right handed.   Patient drinks 1-2 cups caffeine daily.    ECOG Status: 1 - Symptomatic but completely ambulatory  Review of Systems: A 12 point ROS discussed and pertinent positives are indicated in the HPI above.  All other systems are negative.  Review of Systems  Constitutional: Negative for fever, chills, activity change and appetite change.  Gastrointestinal: Positive for abdominal pain.       Patient reports minimal amount of pain surrounding his chronic gastrostomy tube.    Vital Signs: BP 103/76 mmHg  Pulse 89  Resp 14  SpO2 100%  Physical Exam  Musculoskeletal:       Arms: Site of the patient's open though healing port explantation site. No erythema or fluctuance. Nontender to palpation.  Site of the patient's chronic asked gastrostomy tube. No surrounding erythema or fluctuance. A  minimal amount of gastric contents were noted some leaking around the gastrostomy tube though obvious track expansion was not demonstrated.    Mallampati Score:     Imaging: Ir Removal [employer] Access W/ Port W/o Fl Mod Sed  06/20/2015  CLINICAL DATA:  History of lung cancer. Patient with history of right internal jugular approach port a catheter (placed by Dr. Vernard Gambles on 01/16/2009. The Port a catheter has functioned well throughout duration of usage though then now there is concern for exposure of the Port a catheter reservoir secondary to overlying skin erosion. Patient currently is not undergoing chemotherapy and as such, the decision was made to remove the port a catheter. EXAM: REMOVAL OF IMPLANTED TUNNELED PORT-A-CATH MEDICATIONS: Vancomycin 1 g IV: The antibiotic was administered within 1 hour prior to the start of the procedure. ANESTHESIA/SEDATION: Moderate (conscious) sedation was employed during this procedure. A total of Versed 1 mg and Fentanyl 50 mcg was administered intravenously. Moderate Sedation Time: 12 minutes. The patient's level of consciousness and vital signs were monitored continuously by radiology nursing throughout the procedure under my direct supervision. FLUOROSCOPY TIME:  None PROCEDURE: Informed written consent was obtained from the patient after a discussion of the risk, benefits and alternatives to the procedure. The patient was positioned supine on the fluoroscopy table and the right chest Port-A-Cath site was prepped with chlorhexidine. A sterile gown and gloves were worn during the procedure. Local anesthesia was provided with 1% lidocaine with epinephrine. A timeout was performed prior to the initiation of the procedure. Inspection of the [place] a catheter reservoir site demonstrated a discrete erosion measuring approximately 4 mm with exposure of the Port a catheter reservoir hub. An incision was made overlying the Port-A-Cath with a #15 scalpel. Utilizing sharp and blunt  dissection, the Port-A-Cath was removed completely. The pocked was irrigated with sterile saline confirming complete erosion of the overlying skin surface. Given exposure of the Port a catheter reservoir, the decision was made not to perform primary closure of the port reservoir removal site. Rather, the wound was packed with iodoform gauze. A dressing was placed. The patient tolerated the procedure well without immediate post procedural complication. FINDINGS: Successful removal of implant  Port-A-Cath without immediate post procedural complication. Confirmed breakdown of the skin overlying the Port a catheter reservoir with Port a Catheter exposure. IMPRESSION: 1. Successful removal of implanted Port-A-Cath. 2. Given breakdown of skin overlying the Port a catheter reservoir, the removal site was packed with iodoform gauze. PLAN: The patient will return to the interventional radiology the department on Monday (4/27) for removal of the iodoform gauze and conversion to standard gauze and Tegaderm dressing. Electronically Signed   By: Sandi Mariscal M.D.   On: 06/20/2015 10:53   Ir Patient Eval Tech 0-60 Mins  06/23/2015  Patient Information  Patient Name Sex DOB SSN  Mycal, Conde Male Mar 24, 1946 [id number]  Progress Notes by Nicki Reaper, PA-C at 06/19/2015 5:03 PM  Author: Nicki Reaper, PA-C Service: Radiology Author Type: Physician Assistant  Filed: 06/19/2015 5:11 PM Note Time: 06/19/2015 5:03 PM Status: Signed  Editor: Nicki Reaper, PA-C (Physician Assistant)    Expand All Collapse All  Patient ID: [name], male DOB: [birth year], 70 y.o. MRN: [id number] Pt sent to IR today to eval rt chest wall PAC due to presence of a black spot over port hub. Port was placed in 2010. Pt no longer receives chemotherapy via port but it is used occasionally for IV access if needed. Denies fevers, chills, pain at port site or drainage. On exam pt's overlying skin at port site is very thin with  exposed central hub portion . There is no significant erythema but few scattered capillaries are injected. No visible drainage or edema. Pt seen by Dr. Vernard Gambles and plan is for port removal at [facility] on 3/24. Plans d/w pt/wife and Dr. [name].       Labs:  CBC:  Recent Labs  04/24/15 0415 04/26/15 0638 05/23/15 0813 06/20/15 0735  WBC 16.5* 6.9 9.5 10.4  HGB 10.2* 10.2* 12.9* 13.7  HCT 30.1* 30.1* 38.5* 40.4  PLT 137* 150 170 162    COAGS:  Recent Labs  09/05/14 1330 11/06/14 0838 11/26/14 0300 06/20/15 0735  INR 1.32 1.20 1.36 1.10  APTT 30  --  38* 30    BMP:  Recent Labs  04/28/15 0607 04/29/15 0448 05/23/15 0813 06/20/15 0735  NA 144 141 138 139  K 3.9 3.4* 4.5 4.7  CL 110 108 102 102  CO2 '24 24 23 26  '$ GLUCOSE 107* 133* 109* 117*  BUN 19 23* 28* 31*  CALCIUM 9.4 9.1 10.2 10.5*  CREATININE 0.61 0.64 0.96 1.13  GFRNONAA >60 >60 >60 >60  GFRAA >60 >60 >60 >60    LIVER FUNCTION TESTS:  Recent Labs  04/26/15 0638 04/27/15 0527 04/28/15 0607 04/29/15 0448  BILITOT 5.3* 5.9* 7.8* 7.7*  AST 84* 161* 150* 108*  ALT 182* 253* 261* 203*  ALKPHOS 381* 433* 445* 446*  PROT 5.5* 5.7* 6.0* 5.7*  ALBUMIN 2.5* 2.7* 2.7* 2.5*     Assessment and Plan:  [name] is a 70 y.o. male with history of lung cancer, post port a catheter removal on 06/20/2015. He returns today to the interventional radiology clinic for wound check.  Visual inspection demonstrates continued healing of the portacatheter explantation site without evidence of infection. As such, the patient was encouraged to continue with simple gauze and Tegaderm dressing until complete healing is achieved. The patient and the patient's wife demonstrated excellent understanding of this discussion.  In relation to the patient's gastrostomy tube, I explained that it is normal to get a little bit of loosening and leakage around a chronic  gastrostomy tube. At the present time, there are no visual signs  of infection related to the gastrostomy tube and as the gastrostomy tube continues to function well, dedicated intervention is not warranted.  If symptoms were to persist or worsen, I explained that the patient may ultimately require gastrostomy tube exchange with potential upsizing.  No dedicated follow-up is recommended at this time.  The patient was instructed to call the interventional radiology clinic with any future questions or concerns.  A copy of this report was sent to the requesting provider on this date.  Electronically Signed: Sandi Mariscal 07/17/2015, 3:06 PM   I spent a total of 10 Minutes in face to face in clinical consultation, greater than 50% of which was counseling/coordinating care for wound management following port a catheter removal.

## 2015-07-24 ENCOUNTER — Ambulatory Visit: Payer: Medicare Other | Admitting: Neurology

## 2015-08-11 ENCOUNTER — Telehealth: Payer: Self-pay

## 2015-08-11 NOTE — Telephone Encounter (Signed)
Called and informed patient he should expect a call from scheduling for a repeat CT scan and MD appt. Patient verbalized understanding and denies any further questions or concerns at this time. POF sent to scheduling for CT and MD appt with [facility].

## 2015-08-14 ENCOUNTER — Telehealth: Payer: Self-pay | Admitting: Internal Medicine

## 2015-08-14 ENCOUNTER — Other Ambulatory Visit: Payer: Self-pay | Admitting: Internal Medicine

## 2015-08-14 DIAGNOSIS — C3492 Malignant neoplasm of unspecified part of left bronchus or lung: Secondary | ICD-10-CM

## 2015-08-14 NOTE — Telephone Encounter (Signed)
left msg confirming appts

## 2015-08-19 ENCOUNTER — Ambulatory Visit (HOSPITAL_COMMUNITY)
Admission: RE | Admit: 2015-08-19 | Discharge: 2015-08-19 | Disposition: A | Discharge status: Home / Self Care | Payer: Medicare Other | Source: Ambulatory Visit | Attending: Internal Medicine | Admitting: Internal Medicine

## 2015-08-19 ENCOUNTER — Other Ambulatory Visit (HOSPITAL_BASED_OUTPATIENT_CLINIC_OR_DEPARTMENT_OTHER): Payer: Medicare Other

## 2015-08-19 ENCOUNTER — Other Ambulatory Visit: Payer: Self-pay | Admitting: Medical Oncology

## 2015-08-19 ENCOUNTER — Other Ambulatory Visit: Payer: Medicare Other

## 2015-08-19 DIAGNOSIS — C786 Secondary malignant neoplasm of retroperitoneum and peritoneum: Secondary | ICD-10-CM | POA: Insufficient documentation

## 2015-08-19 DIAGNOSIS — Z9689 Presence of other specified functional implants: Secondary | ICD-10-CM | POA: Insufficient documentation

## 2015-08-19 DIAGNOSIS — I2510 Atherosclerotic heart disease of native coronary artery without angina pectoris: Secondary | ICD-10-CM | POA: Insufficient documentation

## 2015-08-19 DIAGNOSIS — N329 Bladder disorder, unspecified: Secondary | ICD-10-CM | POA: Insufficient documentation

## 2015-08-19 DIAGNOSIS — R590 Localized enlarged lymph nodes: Secondary | ICD-10-CM | POA: Diagnosis not present

## 2015-08-19 DIAGNOSIS — I7090 Unspecified atherosclerosis: Secondary | ICD-10-CM | POA: Insufficient documentation

## 2015-08-19 DIAGNOSIS — C3492 Malignant neoplasm of unspecified part of left bronchus or lung: Secondary | ICD-10-CM

## 2015-08-19 DIAGNOSIS — R918 Other nonspecific abnormal finding of lung field: Secondary | ICD-10-CM | POA: Diagnosis not present

## 2015-08-19 LAB — COMPREHENSIVE METABOLIC PANEL
ALBUMIN: 3.5 g/dL (ref 3.5–5.0)
ALK PHOS: 529 U/L — AB (ref 40–150)
ALT: 137 U/L — AB (ref 0–55)
AST: 86 U/L — AB (ref 5–34)
Anion Gap: 14 mEq/L — ABNORMAL HIGH (ref 3–11)
BILIRUBIN TOTAL: 3.72 mg/dL — AB (ref 0.20–1.20)
BUN: 37.5 mg/dL — AB (ref 7.0–26.0)
CO2: 21 mEq/L — ABNORMAL LOW (ref 22–29)
Calcium: 10.3 mg/dL (ref 8.4–10.4)
Chloride: 100 mEq/L (ref 98–109)
Creatinine: 1.3 mg/dL (ref 0.7–1.3)
EGFR: 58 mL/min/{1.73_m2} — ABNORMAL LOW (ref >90)
GLUCOSE: 99 mg/dL (ref 70–140)
Potassium: 4.9 mEq/L (ref 3.5–5.1)
SODIUM: 135 meq/L — AB (ref 136–145)
TOTAL PROTEIN: 8 g/dL (ref 6.4–8.3)

## 2015-08-19 LAB — CBC WITH DIFFERENTIAL/PLATELET
BASO%: 0.4 % (ref 0.0–2.0)
Basophils Absolute: 0 10*3/uL (ref 0.0–0.1)
EOS ABS: 0.5 10*3/uL (ref 0.0–0.5)
EOS%: 4.5 % (ref 0.0–7.0)
HCT: 40.8 % (ref 38.4–49.9)
HEMOGLOBIN: 13.8 g/dL (ref 13.0–17.1)
LYMPH%: 15 % (ref 14.0–49.0)
MCH: 35.6 pg — ABNORMAL HIGH (ref 27.2–33.4)
MCHC: 33.8 g/dL (ref 32.0–36.0)
MCV: 105.2 fL — ABNORMAL HIGH (ref 79.3–98.0)
MONO#: 1.5 10*3/uL — ABNORMAL HIGH (ref 0.1–0.9)
MONO%: 13.1 % (ref 0.0–14.0)
NEUT%: 67 % (ref 39.0–75.0)
NEUTROS ABS: 7.5 10*3/uL — AB (ref 1.5–6.5)
Platelets: 232 10*3/uL (ref 140–400)
RBC: 3.88 10*6/uL — ABNORMAL LOW (ref 4.20–5.82)
RDW: 13.3 % (ref 11.0–14.6)
WBC: 11.2 10*3/uL — AB (ref 4.0–10.3)
lymph#: 1.7 10*3/uL (ref 0.9–3.3)

## 2015-08-19 LAB — TECHNOLOGIST REVIEW

## 2015-08-19 MED ORDER — IOPAMIDOL (ISOVUE-300) INJECTION 61%
100.0000 mL | Freq: Once | INTRAVENOUS | Status: AC | PRN
  Administered 2015-08-19: 80 mL via INTRAVENOUS

## 2015-09-01 ENCOUNTER — Ambulatory Visit (HOSPITAL_BASED_OUTPATIENT_CLINIC_OR_DEPARTMENT_OTHER): Payer: Medicare Other | Admitting: Internal Medicine

## 2015-09-01 ENCOUNTER — Encounter: Payer: Self-pay | Admitting: Internal Medicine

## 2015-09-01 ENCOUNTER — Telehealth: Payer: Self-pay | Admitting: Internal Medicine

## 2015-09-01 VITALS — BP 112/77 | HR 89 | Resp 20 | Ht 68.0 in | Wt 126.2 lb

## 2015-09-01 DIAGNOSIS — C772 Secondary and unspecified malignant neoplasm of intra-abdominal lymph nodes: Secondary | ICD-10-CM

## 2015-09-01 DIAGNOSIS — C3492 Malignant neoplasm of unspecified part of left bronchus or lung: Secondary | ICD-10-CM | POA: Diagnosis present

## 2015-09-01 DIAGNOSIS — C786 Secondary malignant neoplasm of retroperitoneum and peritoneum: Secondary | ICD-10-CM | POA: Diagnosis not present

## 2015-09-01 NOTE — Progress Notes (Signed)
[facility] Telephone:([phone]) [phone]   Fax:([phone]) [place], [place] [address] [place] [address]  DIAGNOSIS AND DIAGNOSIS: Metastatic non-small cell lung cancer adenocarcinoma diagnosed in September 2010   PRIOR THERAPY:  #1 status post 6 cycles of systemic chemotherapy with carboplatin, Alimta and Avastin given every 3 weeks last dose given 04/23/2009 with disease stabilization.  #2 status post palliative radiotherapy to the mediastinum under the care of Dr. Pablo Ledger. The patient received a total dose of 3000 cGY and 12 fractions completed 01/21/2009.  #3 Maintenance chemotherapy with Alimta at 500 mg per meter squared and Avastin at 15 mg per kilogram given every 3 weeks status post 35 cycles.  #4 Maintenance chemotherapy with Alimta 500 mg/M2 and Avastin 15 mg/kg every 4 weeks,status post 17 cycles, discontinued secondary to disease progression.  #5 Systemic chemotherapy with carboplatin for AUC of 5, Alimta 500 mg/M2 and Avastin 15 mg/kg every 3 weeks, status post 3 cycles, last cycle was given 12/28/2012. Carboplatin was discontinued starting cycle #2 secondary to hypersensitivity reaction. #6 Maintenance chemotherapy again with Alimta 500 mg/M2 and Avastin 15 mg/kg every 3 weeks, first cycle 01/23/2013. Status post 6 cycles. #7 palliative radiotherapy to the large abdominal mass under the care of Dr. Pablo Ledger. #8  Immunotherapy with Nivolumab 3 MG/KG every 2 weeks. First dose 10/04/2014. Status post 3 cycles discontinued secondary to disease progression.  CURRENT THERAPY: None.  CODE STATUS: No CODE BLUE  INTERVAL HISTORY: [name] 70 y.o. male returns to the clinic today for follow-up visit accompanied by his wife. The patient was followed by the palliative and hospice care for more than 6 months since his last visit in September 2016. His condition was getting better. The patient was started gaining  weight and he gained over 40 pounds in the last few months. He is still dependent on the PEG tube nutrition. He denied having any significant chest pain, shortness of breath, cough or hemoptysis. He has no nausea or vomiting. He denied having any fever or chills, no headache or visual changes. He requested reevaluation and consideration of treatment if needed. He had CT scan of the chest, abdomen and pelvis performed recently and he is here for evaluation and discussion of his scan results.  MEDICAL HISTORY: Past Medical History  Diagnosis Date  . Hypertension   . Arthritis RIGHT SHOULDER  . Immature cataract BILATERAL  . Acute meniscal tear of knee RIGHT KNEE  . BPH (benign prostatic hypertrophy)   . Dizziness and giddiness 04/23/2014  . Tremor 04/23/2014    Right hand  . HOH (hard of hearing)     bilateral hearing aids  . Radiation 01/08/09-01/21/09    Mediastinum 30 Gy x 12 fractions  . Radiation 08/19/14-09/04/14    Palliative RT Porta hepatic LN 30 Gy  . Pneumonia   . Abnormality of gait 03/12/2015  . Shortness of breath dyspnea     with exertion  . Orthostatic hypotension     B/P tends to run 110/70 or less -using med to raise B/P.  Marland Kitchen ARF (acute renal failure) ([place]) 03/2015    pre-renal patient was septic  . 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 Swallowing impairment     "Unable to swallow"  . Non-small cell lung cancer ([place]) DX SEPT 2010  W/ CHEMORADIATION AT THAT TIME -- NOW  W/ METS--  CURRENTLY  ON MAINTENANCE CHEMO [place] 30 DAYS    ONCOLOGIST- [facility]- lung cancer with metastasis.  . Sepsis ([place]) 03/2015    ALLERGIES:  is allergic to augmentin; carboplatin; levaquin; percocet; scopolamine; and zolpidem tartrate.  MEDICATIONS:  Current Outpatient Prescriptions  Medication Sig Dispense Refill  . b complex vitamins tablet Take 1 tablet by mouth daily.    . diphenhydramine-acetaminophen (TYLENOL PM) 25-500 MG  TABS tablet Take 1 tablet by mouth at bedtime as needed (sleep.).    Marland Kitchen LORazepam (ATIVAN) 1 MG tablet Take 1 mg by mouth at bedtime.    . midodrine (PROAMATINE) 5 MG tablet Take 5 mg by mouth 3 (three) times daily with meals.    . Multiple Vitamin (MULTIVITAMIN) tablet Take 1 tablet by mouth every morning.     . mupirocin ointment (BACTROBAN) 2 % Apply 1 application topically 3 (three) times daily. Around g tube area  0  . Nutritional Supplements (FEEDING SUPPLEMENT, OSMOLITE 1.5 CAL,) LIQD Begin Osmolite 1.5 with 2.0 at 40 cc/ hr continuosly current at 40 cc/ hr for now and gradually advance to 65 cc/ hr which is current goal via PEG tube. ( may take a week or wo to reach his goal)   Flush feeding tube with 240 cc free water 6 times daily. (Patient taking differently: 1,185 mLs by Gastric Tube route See admin instructions. Begin Osmolite 1.5 with 2.0 at 50cc/ hr continuosly current cc.  Could not tolerate 68m /hr   45 ml sterile water per hour) 1422 mL 0  . ondansetron (ZOFRAN) 8 MG tablet Take 1 tablet (8 mg total) by mouth every 8 (eight) hours as needed for nausea or vomiting. 30 tablet 0  . prochlorperazine (COMPAZINE) 10 MG tablet Take 1 tablet (10 mg total) by mouth every 6 (six) hours as needed for nausea or vomiting. 30 tablet 1  . promethazine (PHENERGAN) 25 MG tablet Take 25 mg by mouth every 6 (six) hours as needed for nausea or vomiting.     No current facility-administered medications for this visit.    SURGICAL HISTORY:  Past Surgical History  Procedure Laterality Date  . Rotator cuff repair  2008    LEFT SHOULDER  . Bilateral ear drum surgery  1960'S  . Orif left ring finger  and revascularization of radial side  02-16-2007    DEGLOVING INJURY  . Knee arthroscopy  12/21/2011    Procedure: ARTHROSCOPY KNEE;  Surgeon: RTobi Bastos MD;  [facility]  Service: Orthopedics;  Laterality: Right;  WITH MEDIAL MENISECTOMY  . Cataract extraction Bilateral    . Tonsillectomy      as child  . Shoulder open rotator cuff repair Right 06/05/2014    Procedure: RIGHT ROTATOR CUFF REPAIR SHOULDER OPEN;  Surgeon: RLatanya Maudlin MD;  Location: [facility];  Service: Orthopedics;  Laterality: Right;  . Ercp N/A 08/09/2014    Procedure: ENDOSCOPIC RETROGRADE CHOLANGIOPANCREATOGRAPHY (ERCP);  Surgeon: PCarol Ada MD;  [facility];  Service: Endoscopy;  Laterality: N/A;  . Eus N/A 08/09/2014    Procedure: UPPER ENDOSCOPIC ULTRASOUND (EUS) RADIAL;  Surgeon: PCarol Ada MD;  Location: [facility];  Service: Endoscopy;  Laterality: N/A;  . Fine needle aspiration N/A 08/09/2014    Procedure: FINE NEEDLE ASPIRATION (FNA) LINEAR;  Surgeon: PCarol Ada MD;  Location: [facility];  Service: Endoscopy;  Laterality: N/A;  . Esophagogastroduodenoscopy (egd) with propofol N/A 08/09/2014    Procedure: ESOPHAGOGASTRODUODENOSCOPY (EGD) WITH PROPOFOL;  Surgeon: PCarol Ada MD;  Location: [facility];  Service: Endoscopy;  Laterality: N/A;  . Bile duct stent placement    . Gastrostomy tube placement  10/2014  . [place] cath    . Laryngoscopy  01/2015 and 04/21/15  . Ercp N/A 05/23/2015    Procedure: ENDOSCOPIC RETROGRADE CHOLANGIOPANCREATOGRAPHY (ERCP);  Surgeon: Carol Ada, MD;  Location[facility];  Service: Endoscopy;  Laterality: N/A;  . Portacath placement  Removed 06/20/3015    as of 05-27-15 remains right chest  . Ercp N/A 05/30/2015    Procedure: ENDOSCOPIC RETROGRADE CHOLANGIOPANCREATOGRAPHY (ERCP);  Surgeon: Carol Ada, MD;  [facility];  Service: Endoscopy;  Laterality: N/A;    REVIEW OF SYSTEMS:  Constitutional: positive for fatigue Eyes: negative Ears, nose, mouth, throat, and face: negative Respiratory: negative Cardiovascular: negative Gastrointestinal: positive for dysphagia Genitourinary:negative Integument/breast: negative Hematologic/lymphatic: negative Musculoskeletal:negative Neurological: negative Behavioral/Psych:  negative Endocrine: negative Allergic/Immunologic: negative   PHYSICAL EXAMINATION: General appearance: alert, cooperative, cachectic, fatigued and no distress Head: Normocephalic, without obvious abnormality, atraumatic Neck: no adenopathy, no JVD, supple, symmetrical, trachea midline and thyroid not enlarged, symmetric, no tenderness/mass/nodules Lymph nodes: Cervical, supraclavicular, and axillary nodes normal. Resp: clear to auscultation bilaterally Back: symmetric, no curvature. ROM normal. No CVA tenderness. Cardio: regular rate and rhythm, S1, S2 normal, no murmur, click, rub or gallop GI: soft, non-tender; bowel sounds normal; no masses,  no organomegaly Extremities: extremities normal, atraumatic, no cyanosis or edema Neurologic: Alert and oriented X 3, normal strength and tone. Normal symmetric reflexes. Normal coordination and gait  ECOG PERFORMANCE STATUS: 2 - Symptomatic, <50% confined to bed  Blood pressure 112/77, pulse 89, resp. rate 20, height '5\' 8"'$  (1.727 m), weight 126 lb 3.2 oz (57.244 kg), SpO2 100 %.  LABORATORY DATA: Lab Results  Component Value Date   WBC 11.2* 08/19/2015   HGB 13.8 08/19/2015   HCT 40.8 08/19/2015   MCV 105.2* 08/19/2015   PLT 232 08/19/2015      Chemistry      Component Value Date/Time   NA 135* 08/19/2015 1236   NA 139 06/20/2015 0735   K 4.9 08/19/2015 1236   K 4.7 06/20/2015 0735   CL 102 06/20/2015 0735   CL 109* 08/29/2012 0856   CO2 21* 08/19/2015 1236   CO2 26 06/20/2015 0735   BUN 37.5* 08/19/2015 1236   BUN 31* 06/20/2015 0735   CREATININE 1.3 08/19/2015 1236   CREATININE 1.13 06/20/2015 0735      Component Value Date/Time   CALCIUM 10.3 08/19/2015 1236   CALCIUM 10.5* 06/20/2015 0735   ALKPHOS 529* 08/19/2015 1236   ALKPHOS 446* 04/29/2015 0448   AST 86* 08/19/2015 1236   AST 108* 04/29/2015 0448   ALT 137* 08/19/2015 1236   ALT 203* 04/29/2015 0448   BILITOT 3.72* 08/19/2015 1236   BILITOT 7.7* 04/29/2015  0448       RADIOGRAPHIC STUDIES: Ct Chest W Contrast  08/19/2015  CLINICAL DATA:  Left-sided bronchogenic carcinoma.  Restaging. EXAM: CT CHEST, ABDOMEN, AND PELVIS WITH CONTRAST TECHNIQUE: Multidetector CT imaging of the chest, abdomen and pelvis was performed following the standard protocol during bolus administration of intravenous contrast. CONTRAST:  70m ISOVUE-300 IOPAMIDOL (ISOVUE-300) INJECTION 61% COMPARISON:  Abdominal pelvic CT 04/23/2015. Most recent chest CT 01/02/2015. Abdominal MRI of 04/11/2015 also reviewed. FINDINGS: CT CHEST FINDINGS Mediastinum/Lymph Nodes: No supraclavicular adenopathy. Mild bilateral gynecomastia. Aortic and branch vessel atherosclerosis. Upper normal ascending aortic size, 3.8 cm. Normal heart size, without pericardial effusion. Multivessel coronary artery atherosclerosis. No central pulmonary embolism, on  this non-dedicated study. Right paratracheal adenopathy at 1.6 cm on image 18/series 2. Compare 4.3 cm on 01/02/2015 (when remeasured). No hilar adenopathy. Lungs/Pleura: No pleural fluid.  Mild centrilobular emphysema. Mild paraseptal emphysema. Right paramediastinal radiation fibrosis. Suspect a 3 mm left upper lobe pulmonary nodule medially on image 33/ series 4. Likely similar given differences in slice thickness. Subpleural left upper lobe 3 mm nodule on image 48/series 4 is not significantly changed. 4 mm left lower lobe pulmonary nodule is similar on image 125/series 4. Left lower lobe scarring including on image 98/series 4. Musculoskeletal: No acute osseous abnormality. CT ABDOMEN PELVIS FINDINGS Hepatobiliary: Probable mild hepatic steatosis. Heterogeneous perfusion in the right hepatic dome is likely due to altered perfusion from biliary stent in place. No focal liver lesion. Normal gallbladder. Pneumobilia. No intrahepatic duct dilatation. Common duct stent in place. Pancreas: Mild pancreatic atrophy. Spleen: Normal in size, without focal abnormality.  Adrenals/Urinary Tract: Normal adrenal glands. Mild renal cortical thinning bilaterally. An interpolar left renal cyst. No hydronephrosis. Subtle hyper attenuation in the dependent bladder on image 115/series 2 could be artifactual. Stomach/Bowel: Appropriately positioned gastrostomy tube. Normal colon and terminal ileum. Normal small bowel. Vascular/Lymphatic: Aortic and branch vessel atherosclerosis. Non aneurysmal infrarenal dilatation at 3.2 cm, grossly similar. Left paraortic node measures 12 mm on image 76/series 2 versus 9 mm on the prior exam (when remeasured). Retrocaval/right psoas heterogeneous enhancing mass measures 5.3 by 3.8 cm versus 3.4 x 2.5 cm previously. No pelvic sidewall adenopathy. Reproductive: Mild prostatomegaly. Other: No significant free fluid. Musculoskeletal: No acute osseous abnormality. IMPRESSION: 1. Response to therapy of right paratracheal adenopathy. 2. Progression of retroperitoneal metastatic disease, including enlargement of a dominant retrocaval/psoas mass. 3. Biliary stent in place, without ductal dilatation. 4. Subtle hyper attenuation in the dependent bladder. This could be artifactual. Correlate with symptoms and possibly urinalysis to exclude bladder stone. 5. Pulmonary nodules which are felt to be similar and indeterminate. Recommend attention on follow-up. 6.  Atherosclerosis, including within the coronary arteries. Electronically Signed   By: Abigail Miyamoto M.D.   On: 08/19/2015 16:36   Ct Abdomen Pelvis W Contrast  08/19/2015  CLINICAL DATA:  Left-sided bronchogenic carcinoma.  Restaging. EXAM: CT CHEST, ABDOMEN, AND PELVIS WITH CONTRAST TECHNIQUE: Multidetector CT imaging of the chest, abdomen and pelvis was performed following the standard protocol during bolus administration of intravenous contrast. CONTRAST:  16m ISOVUE-300 IOPAMIDOL (ISOVUE-300) INJECTION 61% COMPARISON:  Abdominal pelvic CT 04/23/2015. Most recent chest CT 01/02/2015. Abdominal MRI of  04/11/2015 also reviewed. FINDINGS: CT CHEST FINDINGS Mediastinum/Lymph Nodes: No supraclavicular adenopathy. Mild bilateral gynecomastia. Aortic and branch vessel atherosclerosis. Upper normal ascending aortic size, 3.8 cm. Normal heart size, without pericardial effusion. Multivessel coronary artery atherosclerosis. No central pulmonary embolism, on this non-dedicated study. Right paratracheal adenopathy at 1.6 cm on image 18/series 2. Compare 4.3 cm on 01/02/2015 (when remeasured). No hilar adenopathy. Lungs/Pleura: No pleural fluid.  Mild centrilobular emphysema. Mild paraseptal emphysema. Right paramediastinal radiation fibrosis. Suspect a 3 mm left upper lobe pulmonary nodule medially on image 33/ series 4. Likely similar given differences in slice thickness. Subpleural left upper lobe 3 mm nodule on image 48/series 4 is not significantly changed. 4 mm left lower lobe pulmonary nodule is similar on image 125/series 4. Left lower lobe scarring including on image 98/series 4. Musculoskeletal: No acute osseous abnormality. CT ABDOMEN PELVIS FINDINGS Hepatobiliary: Probable mild hepatic steatosis. Heterogeneous perfusion in the right hepatic dome is likely due to altered perfusion from biliary stent in place. No  focal liver lesion. Normal gallbladder. Pneumobilia. No intrahepatic duct dilatation. Common duct stent in place. Pancreas: Mild pancreatic atrophy. Spleen: Normal in size, without focal abnormality. Adrenals/Urinary Tract: Normal adrenal glands. Mild renal cortical thinning bilaterally. An interpolar left renal cyst. No hydronephrosis. Subtle hyper attenuation in the dependent bladder on image 115/series 2 could be artifactual. Stomach/Bowel: Appropriately positioned gastrostomy tube. Normal colon and terminal ileum. Normal small bowel. Vascular/Lymphatic: Aortic and branch vessel atherosclerosis. Non aneurysmal infrarenal dilatation at 3.2 cm, grossly similar. Left paraortic node measures 12 mm on image  76/series 2 versus 9 mm on the prior exam (when remeasured). Retrocaval/right psoas heterogeneous enhancing mass measures 5.3 by 3.8 cm versus 3.4 x 2.5 cm previously. No pelvic sidewall adenopathy. Reproductive: Mild prostatomegaly. Other: No significant free fluid. Musculoskeletal: No acute osseous abnormality. IMPRESSION: 1. Response to therapy of right paratracheal adenopathy. 2. Progression of retroperitoneal metastatic disease, including enlargement of a dominant retrocaval/psoas mass. 3. Biliary stent in place, without ductal dilatation. 4. Subtle hyper attenuation in the dependent bladder. This could be artifactual. Correlate with symptoms and possibly urinalysis to exclude bladder stone. 5. Pulmonary nodules which are felt to be similar and indeterminate. Recommend attention on follow-up. 6.  Atherosclerosis, including within the coronary arteries. Electronically Signed   By: Abigail Miyamoto M.D.   On: 08/19/2015 16:36    ASSESSMENT AND PLAN: This is a very pleasant 70 years old white male with a stage IV non-small cell lung cancer diagnosed in September 2010 status post several chemotherapy regimens including immunotherapy which was discontinued recently secondary to disease progression. Over the last few months he patient has significant improvement in his condition. He requested reevaluation for treatment. The recent CT scan of the chest, abdomen and pelvis showed response to therapy of the right paratracheal adenopathy but there was progression of the retroperitoneal metastatic disease. I personally reviewed the images and discussed the scan results with the patient and his wife. I had a lengthy discussion with them regarding his treatment options including continuous observation and monitoring versus CT-guided core biopsy of the retroperitoneal mass for confirmation of the tissue diagnosis and to rule out any other etiology-like lymphoma. I also discussed with the patient resuming his treatment  with Nivolumab after confirmation of the tissue diagnosis if it is consistent with non-small cell lung cancer. The patient and his wife are interested in consideration of the biopsy and further treatment. I will refer the patient to interventional radiology for consideration of CT-guided core biopsy of the retroperitoneal lymphadenopathy. I will see him back for follow-up visit in 3 weeks for evaluation and discussion of his treatment options based on the final biopsy results. He was advised to call immediately if he has any concerning symptoms. The patient voices understanding of current disease status and treatment options and is in agreement with the current care plan.  All questions were answered. The patient knows to call the clinic with any problems, questions or concerns. We can certainly see the patient much sooner if necessary.  I spent 15 minutes counseling the patient face to face. The total time spent in the appointment was 25 minutes.  Disclaimer: This note was dictated with voice recognition software. Similar sounding words can inadvertently be transcribed and may not be corrected upon review.

## 2015-09-01 NOTE — Telephone Encounter (Signed)
per pof to sch pt appt-gave pt copy of avs °

## 2015-09-04 ENCOUNTER — Other Ambulatory Visit: Payer: Self-pay | Admitting: General Surgery

## 2015-09-05 ENCOUNTER — Other Ambulatory Visit: Payer: Self-pay | Admitting: General Surgery

## 2015-09-05 ENCOUNTER — Other Ambulatory Visit: Payer: Self-pay | Admitting: Radiology

## 2015-09-08 ENCOUNTER — Ambulatory Visit (HOSPITAL_COMMUNITY)
Admission: RE | Admit: 2015-09-08 | Discharge: 2015-09-08 | Disposition: A | Discharge status: Home / Self Care | Payer: Medicare Other | Source: Ambulatory Visit | Attending: Internal Medicine | Admitting: Internal Medicine

## 2015-09-08 ENCOUNTER — Encounter (HOSPITAL_COMMUNITY): Payer: Self-pay

## 2015-09-08 DIAGNOSIS — Z923 Personal history of irradiation: Secondary | ICD-10-CM | POA: Insufficient documentation

## 2015-09-08 DIAGNOSIS — N400 Enlarged prostate without lower urinary tract symptoms: Secondary | ICD-10-CM | POA: Diagnosis not present

## 2015-09-08 DIAGNOSIS — Z880 Allergy status to penicillin: Secondary | ICD-10-CM | POA: Insufficient documentation

## 2015-09-08 DIAGNOSIS — N179 Acute kidney failure, unspecified: Secondary | ICD-10-CM | POA: Diagnosis not present

## 2015-09-08 DIAGNOSIS — Z85118 Personal history of other malignant neoplasm of bronchus and lung: Secondary | ICD-10-CM | POA: Insufficient documentation

## 2015-09-08 DIAGNOSIS — R0600 Dyspnea, unspecified: Secondary | ICD-10-CM | POA: Insufficient documentation

## 2015-09-08 DIAGNOSIS — Z931 Gastrostomy status: Secondary | ICD-10-CM | POA: Insufficient documentation

## 2015-09-08 DIAGNOSIS — R269 Unspecified abnormalities of gait and mobility: Secondary | ICD-10-CM | POA: Insufficient documentation

## 2015-09-08 DIAGNOSIS — H9190 Unspecified hearing loss, unspecified ear: Secondary | ICD-10-CM | POA: Insufficient documentation

## 2015-09-08 DIAGNOSIS — M1990 Unspecified osteoarthritis, unspecified site: Secondary | ICD-10-CM | POA: Insufficient documentation

## 2015-09-08 DIAGNOSIS — C3492 Malignant neoplasm of unspecified part of left bronchus or lung: Secondary | ICD-10-CM

## 2015-09-08 DIAGNOSIS — Z87891 Personal history of nicotine dependence: Secondary | ICD-10-CM | POA: Insufficient documentation

## 2015-09-08 DIAGNOSIS — R251 Tremor, unspecified: Secondary | ICD-10-CM | POA: Insufficient documentation

## 2015-09-08 DIAGNOSIS — I10 Essential (primary) hypertension: Secondary | ICD-10-CM | POA: Insufficient documentation

## 2015-09-08 DIAGNOSIS — R1909 Other intra-abdominal and pelvic swelling, mass and lump: Secondary | ICD-10-CM | POA: Diagnosis present

## 2015-09-08 DIAGNOSIS — C482 Malignant neoplasm of peritoneum, unspecified: Secondary | ICD-10-CM | POA: Insufficient documentation

## 2015-09-08 DIAGNOSIS — C772 Secondary and unspecified malignant neoplasm of intra-abdominal lymph nodes: Secondary | ICD-10-CM | POA: Diagnosis present

## 2015-09-08 LAB — CBC
HCT: 39.6 % (ref 39.0–52.0)
HEMOGLOBIN: 13.2 g/dL (ref 13.0–17.0)
MCH: 35.2 pg — ABNORMAL HIGH (ref 26.0–34.0)
MCHC: 33.3 g/dL (ref 30.0–36.0)
MCV: 105.6 fL — AB (ref 78.0–100.0)
PLATELETS: 279 10*3/uL (ref 150–400)
RBC: 3.75 MIL/uL — AB (ref 4.22–5.81)
RDW: 14.1 % (ref 11.5–15.5)
WBC: 21 10*3/uL — AB (ref 4.0–10.5)

## 2015-09-08 LAB — APTT: aPTT: 25 seconds (ref 24–37)

## 2015-09-08 LAB — PROTIME-INR
INR: 1.22 (ref 0.00–1.49)
PROTHROMBIN TIME: 15.6 s — AB (ref 11.6–15.2)

## 2015-09-08 MED ORDER — FENTANYL CITRATE (PF) 100 MCG/2ML IJ SOLN
INTRAMUSCULAR | Status: AC | PRN
  Administered 2015-09-08: 50 ug via INTRAVENOUS

## 2015-09-08 MED ORDER — MIDAZOLAM HCL 2 MG/2ML IJ SOLN
INTRAMUSCULAR | Status: AC | PRN
  Administered 2015-09-08: 1 mg via INTRAVENOUS

## 2015-09-08 MED ORDER — MIDAZOLAM HCL 2 MG/2ML IJ SOLN
INTRAMUSCULAR | Status: AC
  Filled 2015-09-08: qty 4

## 2015-09-08 MED ORDER — SODIUM CHLORIDE 0.9 % IV SOLN
INTRAVENOUS | Status: DC
  Administered 2015-09-08: 10:00:00 via INTRAVENOUS

## 2015-09-08 MED ORDER — FENTANYL CITRATE (PF) 100 MCG/2ML IJ SOLN
INTRAMUSCULAR | Status: AC
  Filled 2015-09-08: qty 4

## 2015-09-08 NOTE — Discharge Instructions (Signed)
Needle Biopsy, Care After °These instructions give you information about caring for yourself after your procedure. Your doctor may also give you more specific instructions. Call your doctor if you have any problems or questions after your procedure. °HOME CARE °· Rest as told by your doctor. °· Take medicines only as told by your doctor. °· There are many different ways to close and cover the biopsy site, including stitches (sutures), skin glue, and adhesive strips. Follow instructions from your doctor about: °¨ How to take care of your biopsy site. °¨ When and how you should change your bandage (dressing). °¨ When you should remove your dressing. °¨ Removing whatever was used to close your biopsy site. °· Check your biopsy site every day for signs of infection. Watch for: °¨ Redness, swelling, or pain. °¨ Fluid, blood, or pus. °GET HELP IF: °· You have a fever. °· You have redness, swelling, or pain at the biopsy site, and it lasts longer than a few days. °· You have fluid, blood, or pus coming from the biopsy site. °· You feel sick to your stomach (nauseous). °· You throw up (vomit). °GET HELP RIGHT AWAY IF: °· You are short of breath. °· You have trouble breathing. °· Your chest hurts. °· You feel dizzy or you pass out (faint). °· You have bleeding that does not stop with pressure or a bandage. °· You cough up blood. °· Your belly (abdomen) hurts. °  °This information is not intended to replace advice given to you by your health care provider. Make sure you discuss any questions you have with your health care provider. °  °Document Released: 02/26/2008 Document Revised: 07/30/2014 Document Reviewed: 03/11/2014 °Elsevier Interactive Patient Education ©2016 [employer] °Moderate Conscious Sedation, Adult, Care After °Refer to this sheet in the next few weeks. These instructions provide you with information on caring for yourself after your procedure. Your health care provider may also give you more specific  instructions. Your treatment has been planned according to current medical practices, but problems sometimes occur. Call your health care provider if you have any problems or questions after your procedure. °WHAT TO EXPECT AFTER THE PROCEDURE  °After your procedure: °· You may feel sleepy, clumsy, and have poor balance for several hours. °· Vomiting may occur if you eat too soon after the procedure. °HOME CARE INSTRUCTIONS °· Do not participate in any activities where you could become injured for at least 24 hours. Do not: °· Drive. °· Swim. °· Ride a bicycle. °· Operate heavy machinery. °· Cook. °· Use power tools. °· Climb ladders. °· Work from a high place. °· Do not make important decisions or sign legal documents until you are improved. °· If you vomit, drink water, juice, or soup when you can drink without vomiting. Make sure you have little or no nausea before eating solid foods. °· Only take over-the-counter or prescription medicines for pain, discomfort, or fever as directed by your health care provider. °· Make sure you and your family fully understand everything about the medicines given to you, including what side effects may occur. °· You should not drink alcohol, take sleeping pills, or take medicines that cause drowsiness for at least 24 hours. °· If you smoke, do not smoke without supervision. °· If you are feeling better, you may resume normal activities 24 hours after you were sedated. °· Keep all appointments with your health care provider. °SEEK MEDICAL CARE IF: °· Your skin is pale or bluish in color. °· You   continue to feel nauseous or vomit. °· Your pain is getting worse and is not helped by medicine. °· You have bleeding or swelling. °· You are still sleepy or feeling clumsy after 24 hours. °SEEK IMMEDIATE MEDICAL CARE IF: °· You develop a rash. °· You have difficulty breathing. °· You develop any type of allergic problem. °· You have a fever. °MAKE SURE YOU: °· Understand these  instructions. °· Will watch your condition. °· Will get help right away if you are not doing well or get worse. °  °This information is not intended to replace advice given to you by your health care provider. Make sure you discuss any questions you have with your health care provider. °  °Document Released: 01/03/2013 Document Revised: 04/05/2014 Document Reviewed: 01/03/2013 °Elsevier Interactive Patient Education ©2016 [employer] ° °

## 2015-09-08 NOTE — H&P (Signed)
Chief Complaint: right RTP mass  Referring Physician:Dr. [name]  Supervising Physician: Arne Cleveland  Patient Status:Out-pt  HPI: [name] is an 70 y.o. male with a history of metastatic non-small cell lung cancer who is followed by Dr. [name].  He has a known RTP mass that he has received palliative radiotherapy for by Dr. Pablo Ledger.  He underwent a CT scan recently that revealed this area again.  The decision was made to proceed with a biopsy to determine tissue diagnose of mets vs a new etiology such as lymphoma.  The patient has no new complaints.  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  He denies CP, SOB, fevers, chills, etc.  Past Medical History:  Past Medical History  Diagnosis Date  . Hypertension   . Arthritis RIGHT SHOULDER  . Immature cataract BILATERAL  . Acute meniscal tear of knee RIGHT KNEE  . BPH (benign prostatic hypertrophy)   . Dizziness and giddiness 04/23/2014  . Tremor 04/23/2014    Right hand  . HOH (hard of hearing)     bilateral hearing aids  . Radiation 01/08/09-01/21/09    Mediastinum 30 Gy x 12 fractions  . Radiation 08/19/14-09/04/14    Palliative RT Porta hepatic LN 30 Gy  . Pneumonia   . Abnormality of gait 03/12/2015  . Shortness of breath dyspnea     with exertion  . Orthostatic hypotension     B/P tends to run 110/70 or less -using med to raise B/P.  Marland Kitchen ARF (acute renal failure) ([place]) 03/2015    pre-renal patient was septic  . 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 Swallowing impairment     "Unable to swallow"  . Non-small cell lung cancer ([place]) DX SEPT 2010  W/ CHEMORADIATION AT THAT TIME -- NOW  W/ METS--  CURRENTLY ON MAINTENANCE CHEMO TX  EVERY 77 DAYS    ONCOLOGIST- DR [facility]- lung cancer with metastasis.  . Sepsis ([place]) 03/2015    Past Surgical History:  Past Surgical History  Procedure Laterality Date  . Rotator cuff repair   2008    LEFT SHOULDER  . Bilateral ear drum surgery  1960'S  . Orif left ring finger  and revascularization of radial side  02-16-2007    DEGLOVING INJURY  . Knee arthroscopy  12/21/2011    Procedure: ARTHROSCOPY KNEE;  Surgeon: Tobi Bastos, MD;  Location: [facility];  Service: Orthopedics;  Laterality: Right;  WITH MEDIAL MENISECTOMY  . Cataract extraction Bilateral   . Tonsillectomy      as child  . Shoulder open rotator cuff repair Right 06/05/2014    Procedure: RIGHT ROTATOR CUFF REPAIR SHOULDER OPEN;  Surgeon: Latanya Maudlin, MD;  Location: [facility];  Service: Orthopedics;  Laterality: Right;  . Ercp N/A 08/09/2014    Procedure: ENDOSCOPIC RETROGRADE CHOLANGIOPANCREATOGRAPHY (ERCP);  Surgeon: Carol Ada, MD;  Location: [facility];  Service: Endoscopy;  Laterality: N/A;  . Eus N/A 08/09/2014    Procedure: UPPER ENDOSCOPIC ULTRASOUND (EUS) RADIAL;  Surgeon: Carol Ada, MD;  Location: [facility];  Service: Endoscopy;  Laterality: N/A;  . Fine needle aspiration N/A 08/09/2014    Procedure: FINE NEEDLE ASPIRATION (FNA) LINEAR;  Surgeon: Carol Ada, MD;  Location: [facility];  Service: Endoscopy;  Laterality: N/A;  . Esophagogastroduodenoscopy (egd) with propofol N/A 08/09/2014    Procedure: ESOPHAGOGASTRODUODENOSCOPY (EGD) WITH PROPOFOL;  Surgeon: Carol Ada, MD;  Location:  [facility];  Service: Endoscopy;  Laterality: N/A;  . Bile duct stent placement    . Gastrostomy tube placement  10/2014  . Latta cath    . Laryngoscopy  01/2015 and 04/21/15  . Ercp N/A 05/23/2015    Procedure: ENDOSCOPIC RETROGRADE CHOLANGIOPANCREATOGRAPHY (ERCP);  Surgeon: Carol Ada, MD;  Location: [facility];  Service: Endoscopy;  Laterality: N/A;  . Portacath placement  Removed 06/20/3015    as of 05-27-15 remains right chest  . Ercp N/A 05/30/2015    Procedure: ENDOSCOPIC RETROGRADE CHOLANGIOPANCREATOGRAPHY (ERCP);  Surgeon: Carol Ada, MD;  Location: [facility];  Service:  Endoscopy;  Laterality: N/A;    Family History:  Family History  Problem Relation Age of Onset  . Colon cancer    . Heart attack    . Cancer    . Hypertension    . Hyperlipidemia    . Congestive Heart Failure Mother   . Cancer Sister   . Cancer Maternal Grandfather   . Colon cancer Father   . Heart attack Brother     Social History:  reports that he quit smoking about 34 years ago. His smoking use included Cigarettes. He has a 40 pack-year smoking history. He has never used smokeless tobacco. He reports that he does not drink alcohol or use illicit drugs.  Allergies:  Allergies  Allergen Reactions  . Augmentin [Amoxicillin-Pot Clavulanate] Diarrhea    .Has patient had a PCN reaction causing immediate rash, facial/tongue/throat swelling, SOB or lightheadedness with hypotension: No Has patient had a PCN reaction causing severe rash involving mucus membranes or skin necrosis: No Has patient had a PCN reaction that required hospitalization No Has patient had a PCN reaction occurring within the last 10 years: No If all of the above answers are "NO", then may proceed with Cephalosporin use.   . Carboplatin Other (See Comments)    Rash and itching after carbo test dose  . Levaquin [Levofloxacin] Other (See Comments)    Knee pain  . Percocet [Oxycodone-Acetaminophen] Nausea And Vomiting  . Scopolamine Other (See Comments)    hallucinations  . Zolpidem Tartrate Diarrhea    Medications:   Medication List    ASK your doctor about these medications        b complex vitamins tablet  Take 1 tablet by mouth daily.     diphenhydramine-acetaminophen 25-500 MG Tabs tablet  Commonly known as:  TYLENOL PM  Take 1 tablet by mouth at bedtime as needed (sleep.).     feeding supplement (OSMOLITE 1.5 CAL) Liqd  Begin Osmolite 1.5 with 2.0 at 40 cc/ hr continuosly current at 40 cc/ hr for now and gradually advance to 65 cc/ hr which is current goal via PEG tube. ( may take a week or wo to  reach his goal)   Flush feeding tube with 240 cc free water 6 times daily.     LORazepam 1 MG tablet  Commonly known as:  ATIVAN  Take 1 mg by mouth at bedtime.     midodrine 5 MG tablet  Commonly known as:  PROAMATINE  Take 5 mg by mouth 3 (three) times daily with meals.     multivitamin tablet  Take 1 tablet by mouth every morning.     mupirocin ointment 2 %  Commonly known as:  BACTROBAN  Apply 1 application topically 3 (three) times daily. Around g tube area     ondansetron 8 MG tablet  Commonly known as:  ZOFRAN  Take 1 tablet (8 mg  total) by mouth every 8 (eight) hours as needed for nausea or vomiting.     prochlorperazine 10 MG tablet  Commonly known as:  COMPAZINE  Take 1 tablet (10 mg total) by mouth every 6 (six) hours as needed for nausea or vomiting.     promethazine 25 MG tablet  Commonly known as:  PHENERGAN  Take 25 mg by mouth every 6 (six) hours as needed for nausea or vomiting.        Please HPI for pertinent positives, otherwise complete 10 system ROS negative.  Mallampati Score: MD Evaluation Airway: WNL Heart: WNL Abdomen: WNL Chest/ Lungs: WNL ASA  Classification: 3 Mallampati/Airway Score: Two  Physical Exam: BP 128/89 mmHg  Pulse 89  Temp(Src)   Resp 18  SpO2 100% There is no weight on file to calculate BMI. General: pleasant, WD, WN white male who is laying in bed in NAD HEENT: head is normocephalic, atraumatic.  Sclera are noninjected.  PERRL.  Ears and nose without any masses or lesions, bilateral hearing aids in place.  Mouth is pink and moist Heart: regular, rate, and rhythm.  Normal s1,s2. No obvious murmurs, gallops, or rubs noted.  Palpable radial and pedal pulses bilaterally Lungs: CTAB, no wheezes, rhonchi, or rales noted.  Respiratory effort nonlabored Abd: soft, NT, ND, +BS, no masses, hernias, or organomegaly, g-tube in place and site is clean MS: all 4 extremities are symmetrical with no cyanosis, clubbing, or edema. Psych:  A&Ox3 with an appropriate affect.   Labs: Pending  Imaging: No results found.  Assessment/Plan 1. Right retroperitoneal mass -we will plan to proceed with a biopsy today to confirm metastatic disease vs new etiology process. -vitals have been reviewed.  His labs are currently pending -Risks and Benefits discussed with the patient including, but not limited to bleeding, infection, damage to adjacent structures or low yield requiring additional tests. All of the patient's questions were answered, patient is agreeable to proceed. Consent signed and in chart.   Thank you for this interesting consult.  I greatly enjoyed meeting Adolph Clutter and look forward to participating in their care.  A copy of this report was sent to the requesting provider on this date.  Electronically Signed: Henreitta Cea 09/08/2015, 9:27 AM   I spent a total of    25 Minutes in face to face in clinical consultation, greater than 50% of which was counseling/coordinating care for right retroperitoneal mass

## 2015-09-08 NOTE — Procedures (Signed)
CT core bx retroperitoneal mass 18g cores in saline to surg path No complication No blood loss. See complete dictation in [facility].

## 2015-09-15 ENCOUNTER — Other Ambulatory Visit (HOSPITAL_COMMUNITY): Payer: Self-pay | Admitting: Family Medicine

## 2015-09-15 DIAGNOSIS — R1310 Dysphagia, unspecified: Secondary | ICD-10-CM

## 2015-09-16 ENCOUNTER — Ambulatory Visit (HOSPITAL_COMMUNITY)
Admission: RE | Admit: 2015-09-16 | Discharge: 2015-09-16 | Disposition: A | Discharge status: Home / Self Care | Payer: Medicare Other | Source: Ambulatory Visit | Attending: Family Medicine | Admitting: Family Medicine

## 2015-09-16 DIAGNOSIS — R1310 Dysphagia, unspecified: Secondary | ICD-10-CM | POA: Insufficient documentation

## 2015-09-19 ENCOUNTER — Encounter (HOSPITAL_COMMUNITY): Payer: Self-pay

## 2015-09-22 ENCOUNTER — Telehealth: Payer: Self-pay | Admitting: Internal Medicine

## 2015-09-22 ENCOUNTER — Encounter: Payer: Self-pay | Admitting: Internal Medicine

## 2015-09-22 ENCOUNTER — Ambulatory Visit (HOSPITAL_BASED_OUTPATIENT_CLINIC_OR_DEPARTMENT_OTHER): Payer: Medicare Other | Admitting: Internal Medicine

## 2015-09-22 VITALS — BP 105/73 | HR 89 | Temp 97.5°F | Resp 18 | Ht 68.0 in | Wt 125.3 lb

## 2015-09-22 DIAGNOSIS — C3492 Malignant neoplasm of unspecified part of left bronchus or lung: Secondary | ICD-10-CM

## 2015-09-22 DIAGNOSIS — C786 Secondary malignant neoplasm of retroperitoneum and peritoneum: Secondary | ICD-10-CM

## 2015-09-22 DIAGNOSIS — C3490 Malignant neoplasm of unspecified part of unspecified bronchus or lung: Secondary | ICD-10-CM | POA: Diagnosis present

## 2015-09-22 DIAGNOSIS — C772 Secondary and unspecified malignant neoplasm of intra-abdominal lymph nodes: Secondary | ICD-10-CM

## 2015-09-22 NOTE — Progress Notes (Signed)
[place] [phone]   Fax:([phone]) [place], [place] [address] [place] [address]  DIAGNOSIS AND DIAGNOSIS: Metastatic non-small cell lung cancer adenocarcinoma diagnosed in September 2010   PRIOR THERAPY:  #1 status post 6 cycles of systemic chemotherapy with carboplatin, Alimta and Avastin given every 3 weeks last dose given 04/23/2009 with disease stabilization.  #2 status post palliative radiotherapy to the mediastinum under the care of Dr. Pablo Ledger. The patient received a total dose of 3000 cGY and 12 fractions completed 01/21/2009.  #3 Maintenance chemotherapy with Alimta at 500 mg per meter squared and Avastin at 15 mg per kilogram given every 3 weeks status post 35 cycles.  #4 Maintenance chemotherapy with Alimta 500 mg/M2 and Avastin 15 mg/kg every 4 weeks,status post 17 cycles, discontinued secondary to disease progression.  #5 Systemic chemotherapy with carboplatin for AUC of 5, Alimta 500 mg/M2 and Avastin 15 mg/kg every 3 weeks, status post 3 cycles, last cycle was given 12/28/2012. Carboplatin was discontinued starting cycle #2 secondary to hypersensitivity reaction. #6 Maintenance chemotherapy again with Alimta 500 mg/M2 and Avastin 15 mg/kg every 3 weeks, first cycle 01/23/2013. Status post 6 cycles. #7 palliative radiotherapy to the large abdominal mass under the care of Dr. Pablo Ledger. #8  Immunotherapy with Nivolumab 3 MG/KG every 2 weeks. First dose 10/04/2014. Status post 3 cycles discontinued secondary to disease progression.  CURRENT THERAPY: None.  CODE STATUS: No CODE BLUE  INTERVAL HISTORY: [name] 70 y.o. male returns to the clinic today for follow-up visit accompanied by his wife. The patient is feeling fine today was no specific complaints except for intermittent pain at the right groin area. He takes ibuprofen and Aleve as needed with some improvement. He gained a  lot of weight over the last few months. He is still dependent on the PEG tube nutrition. He denied having any significant chest pain, shortness of breath, cough or hemoptysis. He has no nausea or vomiting. He denied having any fever or chills, no headache or visual changes. He underwent CT-guided core biopsy of the enlarging retroperitoneal lymph nodes and it was consistent with metastatic poorly differentiated carcinoma. He is here today for evaluation and discussion of his treatment options based on the final pathology.  MEDICAL HISTORY: Past Medical History  Diagnosis Date  . Hypertension   . Arthritis RIGHT SHOULDER  . Immature cataract BILATERAL  . Acute meniscal tear of knee RIGHT KNEE  . BPH (benign prostatic hypertrophy)   . Dizziness and giddiness 04/23/2014  . Tremor 04/23/2014    Right hand  . HOH (hard of hearing)     bilateral hearing aids  . Radiation 01/08/09-01/21/09    Mediastinum 30 Gy x 12 fractions  . Radiation 08/19/14-09/04/14    Palliative RT Porta hepatic LN 30 Gy  . Pneumonia   . Abnormality of gait 03/12/2015  . Shortness of breath dyspnea     with exertion  . Orthostatic hypotension     B/P tends to run 110/70 or less -using med to raise B/P.  Marland Kitchen ARF (acute renal failure) ([place]) 03/2015    pre-renal patient was septic  . 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 Swallowing impairment     "Unable to swallow"  . Non-small cell lung cancer ([place]) DX SEPT 2010  W/ CHEMORADIATION AT THAT TIME -- NOW  W/ METS--  CURRENTLY  ON MAINTENANCE CHEMO [place] 30 DAYS    ONCOLOGIST- DR [facility]- lung cancer with metastasis.  . Sepsis ([place]) 03/2015    ALLERGIES:  is allergic to augmentin; carboplatin; levaquin; percocet; scopolamine; and zolpidem tartrate.  MEDICATIONS:  Current Outpatient Prescriptions  Medication Sig Dispense Refill  . b complex vitamins tablet Take 1 tablet by mouth daily.    .  diphenhydramine-acetaminophen (TYLENOL PM) 25-500 MG TABS tablet Take 1 tablet by mouth at bedtime as needed (sleep.).    Marland Kitchen doxycycline (VIBRAMYCIN) 100 MG capsule Take 100 mg by mouth 2 (two) times daily.    Marland Kitchen LORazepam (ATIVAN) 1 MG tablet Take 1 mg by mouth at bedtime.    . meloxicam (MOBIC) 7.5 MG tablet Take 7.5 mg by mouth daily.    . methylPREDNISolone (MEDROL) 4 MG tablet Take 4 mg by mouth daily.    . midodrine (PROAMATINE) 5 MG tablet Take 5 mg by mouth 3 (three) times daily with meals.    . Multiple Vitamin (MULTIVITAMIN) tablet Take 1 tablet by mouth every morning.     . mupirocin ointment (BACTROBAN) 2 % Apply 1 application topically 3 (three) times daily. Around g tube area  0  . Nutritional Supplements (FEEDING SUPPLEMENT, OSMOLITE 1.5 CAL,) LIQD Begin Osmolite 1.5 with 2.0 at 40 cc/ hr continuosly current at 40 cc/ hr for now and gradually advance to 65 cc/ hr which is current goal via PEG tube. ( may take a week or wo to reach his goal)   Flush feeding tube with 240 cc free water 6 times daily. (Patient taking differently: 1,185 mLs by Gastric Tube route See admin instructions. Begin Osmolite 1.5 with 2.0 at 50cc/ hr continuosly current cc.  Could not tolerate 25m /hr   45 ml sterile water per hour) 1422 mL 0  . ondansetron (ZOFRAN) 8 MG tablet Take 1 tablet (8 mg total) by mouth every 8 (eight) hours as needed for nausea or vomiting. 30 tablet 0  . prochlorperazine (COMPAZINE) 10 MG tablet Take 1 tablet (10 mg total) by mouth every 6 (six) hours as needed for nausea or vomiting. 30 tablet 1  . promethazine (PHENERGAN) 25 MG tablet Take 25 mg by mouth every 6 (six) hours as needed for nausea or vomiting.     No current facility-administered medications for this visit.    SURGICAL HISTORY:  Past Surgical History  Procedure Laterality Date  . Rotator cuff repair  2008    LEFT SHOULDER  . Bilateral ear drum surgery  1960'S  . Orif left ring finger  and revascularization of  radial side  02-16-2007    DEGLOVING INJURY  . Knee arthroscopy  12/21/2011    Procedure: ARTHROSCOPY KNEE;  Surgeon: RTobi Bastos MD;  [facility]  Service: Orthopedics;  Laterality: Right;  WITH MEDIAL MENISECTOMY  . Cataract extraction Bilateral   . Tonsillectomy      as child  . Shoulder open rotator cuff repair Right 06/05/2014    Procedure: RIGHT ROTATOR CUFF REPAIR SHOULDER OPEN;  Surgeon: RLatanya Maudlin MD;  Location: [facility];  Service: Orthopedics;  Laterality: Right;  . Ercp N/A 08/09/2014    Procedure: ENDOSCOPIC RETROGRADE CHOLANGIOPANCREATOGRAPHY (ERCP);  Surgeon: PCarol Ada MD;  [facility];  Service: Endoscopy;  Laterality: N/A;  . Eus N/A 08/09/2014    Procedure: UPPER ENDOSCOPIC ULTRASOUND (EUS) RADIAL;  Surgeon: PCarol Ada MD;  [facility];  Service: Endoscopy;  Laterality: N/A;  . Fine needle aspiration  N/A 08/09/2014    Procedure: FINE NEEDLE ASPIRATION (FNA) LINEAR;  Surgeon: Carol Ada, MD;  [facility];  Service: Endoscopy;  Laterality: N/A;  . Esophagogastroduodenoscopy (egd) with propofol N/A 08/09/2014    Procedure: ESOPHAGOGASTRODUODENOSCOPY (EGD) WITH PROPOFOL;  Surgeon: Carol Ada, MD;  [facility];  Service: Endoscopy;  Laterality: N/A;  . Bile duct stent placement    . Gastrostomy tube placement  10/2014  . [place] cath    . Laryngoscopy  01/2015 and 04/21/15  . Ercp N/A 05/23/2015    Procedure: ENDOSCOPIC RETROGRADE CHOLANGIOPANCREATOGRAPHY (ERCP);  Surgeon: Carol Ada, MD;  Location: [facility];  Service: Endoscopy;  Laterality: N/A;  . Portacath placement  Removed 06/20/3015    as of 05-27-15 remains right chest  . Ercp N/A 05/30/2015    Procedure: ENDOSCOPIC RETROGRADE CHOLANGIOPANCREATOGRAPHY (ERCP);  Surgeon: Carol Ada, MD;  [facility];  Service: Endoscopy;  Laterality: N/A;    REVIEW OF SYSTEMS:  Constitutional: positive for fatigue Eyes: negative Ears, nose,  mouth, throat, and face: negative Respiratory: negative Cardiovascular: negative Gastrointestinal: positive for dysphagia Genitourinary:negative Integument/breast: negative Hematologic/lymphatic: negative Musculoskeletal:negative Neurological: negative Behavioral/Psych: negative Endocrine: negative Allergic/Immunologic: negative   PHYSICAL EXAMINATION: General appearance: alert, cooperative, cachectic, fatigued and no distress Head: Normocephalic, without obvious abnormality, atraumatic Neck: no adenopathy, no JVD, supple, symmetrical, trachea midline and thyroid not enlarged, symmetric, no tenderness/mass/nodules Lymph nodes: Cervical, supraclavicular, and axillary nodes normal. Resp: clear to auscultation bilaterally Back: symmetric, no curvature. ROM normal. No CVA tenderness. Cardio: regular rate and rhythm, S1, S2 normal, no murmur, click, rub or gallop GI: soft, non-tender; bowel sounds normal; no masses,  no organomegaly Extremities: extremities normal, atraumatic, no cyanosis or edema Neurologic: Alert and oriented X 3, normal strength and tone. Normal symmetric reflexes. Normal coordination and gait  ECOG PERFORMANCE STATUS: 2 - Symptomatic, <50% confined to bed  There were no vitals taken for this visit.  LABORATORY DATA: Lab Results  Component Value Date   WBC 21.0* 09/08/2015   HGB 13.2 09/08/2015   HCT 39.6 09/08/2015   MCV 105.6* 09/08/2015   PLT 279 09/08/2015      Chemistry      Component Value Date/Time   NA 135* 08/19/2015 1236   NA 139 06/20/2015 0735   K 4.9 08/19/2015 1236   K 4.7 06/20/2015 0735   CL 102 06/20/2015 0735   CL 109* 08/29/2012 0856   CO2 21* 08/19/2015 1236   CO2 26 06/20/2015 0735   BUN 37.5* 08/19/2015 1236   BUN 31* 06/20/2015 0735   CREATININE 1.3 08/19/2015 1236   CREATININE 1.13 06/20/2015 0735      Component Value Date/Time   CALCIUM 10.3 08/19/2015 1236   CALCIUM 10.5* 06/20/2015 0735   ALKPHOS 529* 08/19/2015 1236     ALKPHOS 446* 04/29/2015 0448   AST 86* 08/19/2015 1236   AST 108* 04/29/2015 0448   ALT 137* 08/19/2015 1236   ALT 203* 04/29/2015 0448   BILITOT 3.72* 08/19/2015 1236   BILITOT 7.7* 04/29/2015 0448       RADIOGRAPHIC STUDIES: Dg Op Swallowing Func-medicare/speech Path  09/16/2015  Objective Swallowing Evaluation: Type of Study: MBS-Modified Barium Swallow Study Patient Details Name: [name] MRN: [id number] Date of Birth: 11-06-1945 Today's Date: 09/16/2015 Time: SLP Start Time (ACUTE ONLY): 1310-SLP Stop Time (ACUTE ONLY): 1350 SLP Time Calculation (min) (ACUTE ONLY): 40 min Past Medical History: Past Medical History Diagnosis Date . Hypertension  . Arthritis RIGHT SHOULDER . Immature cataract BILATERAL . Acute meniscal tear  of knee RIGHT KNEE . BPH (benign prostatic hypertrophy)  . Dizziness and giddiness 04/23/2014 . Tremor 04/23/2014   Right hand . HOH (hard of hearing)    bilateral hearing aids . Radiation 01/08/09-01/21/09   Mediastinum 30 Gy x 12 fractions . Radiation 08/19/14-09/04/14   Palliative RT Porta hepatic LN 30 Gy . Pneumonia  . Abnormality of gait 03/12/2015 . Shortness of breath dyspnea    with exertion . Orthostatic hypotension    B/P tends to run 110/70 or less -using med to raise B/P. Marland Kitchen ARF (acute renal failure) ([place]) 03/2015   pre-renal patient was septic . Gastrostomy tube dependent (HCC)    Continous feedings per Gastostomy tube"unable to swallow" . Swallowing impairment    "Unable to swallow" . Non-small cell lung cancer ([place]) DX SEPT 2010  W/ CHEMORADIATION AT THAT TIME -- NOW  W/ METS--  CURRENTLY ON MAINTENANCE CHEMO [place] 30 DAYS   ONCOLOGIST- DR [facility]- lung cancer with metastasis. . Sepsis ([place]) 03/2015 Past Surgical History: Past Surgical History Procedure Laterality Date . Rotator cuff repair  2008   LEFT SHOULDER . Bilateral ear drum surgery  1960'S . Orif left ring finger  and revascularization of radial side  02-16-2007   DEGLOVING INJURY . Knee arthroscopy   12/21/2011   Procedure: ARTHROSCOPY KNEE;  Surgeon: Tobi Bastos, MD;  Location: [facility];  Service: Orthopedics;  Laterality: Right;  WITH MEDIAL MENISECTOMY . Cataract extraction Bilateral  . Tonsillectomy     as child . Shoulder open rotator cuff repair Right 06/05/2014   Procedure: RIGHT ROTATOR CUFF REPAIR SHOULDER OPEN;  Surgeon: Latanya Maudlin, MD;  [facility];  Service: Orthopedics;  Laterality: Right; . Ercp N/A 08/09/2014   Procedure: ENDOSCOPIC RETROGRADE CHOLANGIOPANCREATOGRAPHY (ERCP);  Surgeon: Carol Ada, MD;  [facility];  Service: Endoscopy;  Laterality: N/A; . Eus N/A 08/09/2014   Procedure: UPPER ENDOSCOPIC ULTRASOUND (EUS) RADIAL;  Surgeon: Carol Ada, MD;  [facility];  Service: Endoscopy;  Laterality: N/A; . Fine needle aspiration N/A 08/09/2014   Procedure: FINE NEEDLE ASPIRATION (FNA) LINEAR;  Surgeon: Carol Ada, MD;  [facility];  Service: Endoscopy;  Laterality: N/A; . Esophagogastroduodenoscopy (egd) with propofol N/A 08/09/2014   Procedure: ESOPHAGOGASTRODUODENOSCOPY (EGD) WITH PROPOFOL;  Surgeon: Carol Ada, MD;  [facility];  Service: Endoscopy;  Laterality: N/A; . Bile duct stent placement   . Gastrostomy tube placement  10/2014 . [place] cath   . Laryngoscopy  01/2015 and 04/21/15 . Ercp N/A 05/23/2015   Procedure: ENDOSCOPIC RETROGRADE CHOLANGIOPANCREATOGRAPHY (ERCP);  Surgeon: Carol Ada, MD;  Location: [facility] ENDOSCOPY;  Service: Endoscopy;  Laterality: N/A; . Portacath placement  Removed 06/20/3015   as of 05-27-15 remains right chest . Ercp N/A 05/30/2015   Procedure: ENDOSCOPIC RETROGRADE CHOLANGIOPANCREATOGRAPHY (ERCP);  Surgeon: Carol Ada, MD;  [facility];  Service: Endoscopy;  Laterality: N/A; HPI: pt is a 70 yo with complex medical hx including metastatic lung cancer s/p radiation tx over 7 years - most recent near throat region per spouse finishing in October 2016.  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  Pt reports he has only been consuming water (approx 3 ounces daily) by mouth and relies on tube feeding for nutritional support.  He admits to occasionally coughing on liquids -  sensing it "going the wrong way" and residuals in throat when he tries solid foods.  Pt has seen a [place] SLP once but his wife stated he had a lot going on and did not continue SLP after one visit.  Pt's wife admits to his voice becoming more hoarse recently- within the last few days.  Pt admits to trying some foods this week but having to expectorate solids.   Subjective: pt alert, sitting in chair - wife present Assessment / Plan / Recommendation CHL IP CLINICAL IMPRESSIONS 09/16/2015 Therapy Diagnosis Severe pharyngeal phase dysphagia Clinical Impression Pt presents with severe pharyngeal *suspected also cervical esophageal* dysphagia with sensorimotor deficits.  Pt with severely impaired hyolaryngeal motility and tongue base retraction resulting in gross residuals across all consistencies. Pt with laryngeal penetration of liquid barium that is retained in pharynx with further swallow attempts.  No aspiration observed during MBS.  Various postures including chin tuck and head turn left did not improve clearance.  Pt did expectorate per SLP cue and removed vallecular stasis of cracker/liquid.  He did not clear cracker residual mixed with secretions in pyriform sinus despite multiple swallows - thin water.  Pt did have subtle cough after thin water swallow - presumed due to penetraiton/aspiration.  Recommend pt continue NPO except water after oral care to decrease disuse muscle atrophy.  Using live video, educated pt/spouse.  Follow up SLP for dysphagia treatment recommended to see if pt can get some functional return.  Pt and spouse agreeable  to results of testing and recommendations.    Impact on safety and function Severe aspiration risk;Risk for inadequate nutrition/hydration   No flowsheet data found.  No flowsheet data found. CHL IP DIET RECOMMENDATION 09/16/2015 SLP Diet Recommendations NPO;Ice chips PRN after oral care;Other (Comment)[place] Water Protocol Liquid Administration via Cup Medication Administration Via peg Compensations Slow rate;Small sips/bites;Multiple dry swallows after each bite/sip;Effortful swallow; expectorate after intake  Postural Changes Remain semi-upright after after feeds/meals (Comment);Seated upright at 90 degrees   CHL IP OTHER RECOMMENDATIONS 09/16/2015 Recommended Consults Other (Comment) Oral Care Recommendations Oral care BID Other Recommendations --   CHL IP FOLLOW UP RECOMMENDATIONS 09/16/2015 Follow up Recommendations (No Data)   No flowsheet data found.     CHL IP ORAL PHASE 09/16/2015 Oral Phase WFL Oral - Pudding Teaspoon -- Oral - Pudding Cup -- Oral - Honey Teaspoon -- Oral - Honey Cup -- Oral - Nectar Teaspoon -- Oral - Nectar Cup WFL Oral - Nectar Straw -- Oral - Thin Teaspoon WFL Oral - Thin Cup WFL Oral - Thin Straw -- Oral - Puree -- Oral - Mech Soft WFL Oral - Regular -- Oral - Multi-Consistency -- Oral - Pill -- Oral Phase - Comment --  CHL IP PHARYNGEAL PHASE 09/16/2015 Pharyngeal Phase Impaired Pharyngeal- Pudding Teaspoon -- Pharyngeal -- Pharyngeal- Pudding Cup -- Pharyngeal -- Pharyngeal- Honey Teaspoon -- Pharyngeal -- Pharyngeal- Honey Cup -- Pharyngeal -- Pharyngeal- Nectar Teaspoon -- Pharyngeal -- Pharyngeal- Nectar Cup Reduced pharyngeal peristalsis;Reduced epiglottic inversion;Reduced anterior laryngeal mobility;Reduced laryngeal elevation;Reduced airway/laryngeal closure;Reduced tongue base retraction;Penetration/Apiration after swallow;Pharyngeal residue - valleculae;Pharyngeal residue - pyriform;Lateral channel residue;Compensatory strategies attempted (with notebox) Pharyngeal Material  enters airway, remains ABOVE vocal cords then ejected out Pharyngeal- Nectar Straw -- Pharyngeal -- Pharyngeal- Thin Teaspoon Reduced pharyngeal peristalsis;Reduced epiglottic inversion;Reduced anterior laryngeal mobility;Reduced laryngeal elevation;Reduced airway/laryngeal closure;Reduced tongue base retraction;Penetration/Apiration after swallow;Pharyngeal residue - valleculae;Pharyngeal residue - pyriform;Compensatory strategies attempted (with notebox) Pharyngeal Material enters airway, remains ABOVE vocal cords and not ejected out Pharyngeal- Thin  Cup Reduced pharyngeal peristalsis;Reduced epiglottic inversion;Reduced anterior laryngeal mobility;Reduced laryngeal elevation;Reduced airway/laryngeal closure;Reduced tongue base retraction;Penetration/Apiration after swallow;Pharyngeal residue - valleculae;Pharyngeal residue - pyriform Pharyngeal Material enters airway, remains ABOVE vocal cords and not ejected out Pharyngeal- Thin Straw -- Pharyngeal -- Pharyngeal- Puree -- Pharyngeal -- Pharyngeal- Mechanical Soft Reduced pharyngeal peristalsis;Reduced epiglottic inversion;Reduced anterior laryngeal mobility;Reduced laryngeal elevation;Reduced airway/laryngeal closure;Reduced tongue base retraction;Pharyngeal residue - valleculae;Pharyngeal residue - pyriform Pharyngeal -- Pharyngeal- Regular -- Pharyngeal -- Pharyngeal- Multi-consistency -- Pharyngeal -- Pharyngeal- Pill -- Pharyngeal -- Pharyngeal Comment head turn left and chin tuck did not help swallow, dry swallows decreased amount of residuals but did not eliminate them, increased viscocity resulted in worsening residuals, pt fortunately able to expectorate and clear vallecular residuals  CHL IP CERVICAL ESOPHAGEAL PHASE 09/16/2015 Cervical Esophageal Phase Impaired Pudding Teaspoon -- Pudding Cup -- Honey Teaspoon -- Honey Cup -- Nectar Teaspoon -- Nectar Cup Reduced cricopharyngeal relaxation Nectar Straw -- Thin Teaspoon Reduced cricopharyngeal relaxation  Thin Cup Reduced cricopharyngeal relaxation Thin Straw Reduced cricopharyngeal relaxation Puree -- Mechanical Soft Reduced cricopharyngeal relaxation Regular -- Multi-consistency -- Pill -- Cervical Esophageal Comment -- CHL IP GO 09/16/2015 Functional Assessment Tool Used mbs Functional Limitations Swallowing Swallow Current Status ([id number]) CM Swallow Goal Status (M7867) CM Swallow Discharge Status (J4492) CM Luanna Salk, MS [facility] SLP [phone] Macario Golds 09/16/2015, 3:27 PM            CLINICAL DATA:  70 year old male with lung cancer. PEG tube in place. Vocal cord paralysis post Teflon injection. Initial encounter. EXAM: MODIFIED BARIUM SWALLOW TECHNIQUE: Different consistencies of barium were administered orally to the patient by the Speech Pathologist. Imaging of the pharynx was performed in the lateral projection. FLUOROSCOPY TIME:  Radiation Exposure Index (as provided by the fluoroscopic device): 4.9 micro Gray Fluoroscopy Time:  1 minutes and 22 seconds. COMPARISON:  None. FINDINGS: Thin liquid- pooling within the vallecula. During clearing of barium within the vallecular, laryngeal penetration occurs. Nectar thick liquid- pooling within the vallecula. During clearing of barium within the vallecular, laryngeal penetration occurs. Pure with cracker- pooling within the vallecula. IMPRESSION: pooling within the vallecular with ingestion of thin liquid, nectar thick liquid and puree coated cracker. Laryngeal penetration occurs when patient attempts to clear barium contained within the vallecula. Please refer to the Speech Pathologists report for complete details and recommendations. Electronically Signed   By: Genia Del M.D.   On: 09/16/2015 14:16   Ct Biopsy  09/08/2015  CLINICAL DATA:  Left lung bronchogenic carcinoma. Enlarging right retroperitoneal mass. EXAM: CT GUIDED CORE BIOPSY OF RIGHT RETROPERITONEAL MASS ANESTHESIA/SEDATION: Intravenous Fentanyl and Versed were administered as conscious  sedation during continuous monitoring of the patient's level of consciousness and physiological / cardiorespiratory status by the radiology RN, with a total moderate sedation time of 8 minutes. PROCEDURE: The procedure risks, benefits, and alternatives were explained to the patient. Questions regarding the procedure were encouraged and answered. The patient understands and consents to the procedure. Patient placed prone. Select axial scans through the mid abdomen obtained. The lesion was localized an appropriate skin entry site was determined and marked. The operative field was prepped with chlorhexidinein a sterile fashion, and a sterile drape was applied covering the operative field. A sterile gown and sterile gloves were used for the procedure. Local anesthesia was provided with 1% Lidocaine. Under CT fluoroscopic guidance, a 17 gauge trocar needle was advanced to the margin of the lesion. Once needle tip position was confirmed, coaxial 18-gauge core biopsy samples were obtained,  submitted in saline to surgical pathology. The guide needle was removed. The patient tolerated the procedure well. COMPLICATIONS: None immediate FINDINGS: Limited abdominal CT again demonstrated right retroperitoneal mass measuring at least 5 cm distorting the right psoas. Core biopsy samples obtained under CT fluoroscopic guidance. No post procedure hemorrhage or other apparent complication. IMPRESSION: 1. Technically successful CT-guided core biopsy of right retroperitoneal mass. Electronically Signed   By: Lucrezia Europe M.D.   On: 09/08/2015 12:22    ASSESSMENT AND PLAN: This is a very pleasant 70 years old white male with a stage IV non-small cell lung cancer diagnosed in September 2010 status post several chemotherapy regimens including immunotherapy which was discontinued recently secondary to disease progression. Over the last few months he patient has significant improvement in his condition. He requested reevaluation for  treatment. The recent CT scan of the chest, abdomen and pelvis showed response to therapy of the right paratracheal adenopathy but there was progression of the retroperitoneal metastatic disease. The final pathology from the CT-guided core biopsy of the enlarging retroperitoneal lymphadenopathy was consistent with metastatic poorly differentiated carcinoma. Molecular studies were negative for EGFR, ALK and ROS 1. PDL 1 expression is still pending. I discussed the biopsy results with the patient and his wife. I discussed with the patient several options for treatment of his condition including continuous palliative care and hospice versus consideration of treatment with immunotherapy with Nivolumab. I also discussed with him referral to radiation oncology for consideration of palliative radiotherapy to the enlarging retroperitoneal lymph nodes especially as the patient is currently symptomatic with right inguinal pain. He is interested in the palliative radiotherapy and I referred him to Dr. Sondra Come for evaluation and discussion of this option. He is also interested in resuming immunotherapy after completion of the palliative radiotherapy. I will see him back for follow-up visit in one month's for reevaluation and more detailed discussion of this option. He was advised to call immediately if he has any concerning symptoms. The patient voices understanding of current disease status and treatment options and is in agreement with the current care plan.  All questions were answered. The patient knows to call the clinic with any problems, questions or concerns. We can certainly see the patient much sooner if necessary.  I spent 15 minutes counseling the patient face to face. The total time spent in the appointment was 25 minutes.  Disclaimer: This note was dictated with voice recognition software. Similar sounding words can inadvertently be transcribed and may not be corrected upon review.

## 2015-09-22 NOTE — Telephone Encounter (Signed)
Gave pt cal & avs °

## 2015-09-24 ENCOUNTER — Ambulatory Visit
Admission: RE | Admit: 2015-09-24 | Discharge: 2015-09-24 | Disposition: A | Discharge status: Home / Self Care | Payer: Medicare Other | Source: Ambulatory Visit | Attending: Radiation Oncology | Admitting: Radiation Oncology

## 2015-09-24 ENCOUNTER — Encounter (HOSPITAL_COMMUNITY): Payer: Self-pay

## 2015-09-24 ENCOUNTER — Encounter: Payer: Self-pay | Admitting: Radiation Oncology

## 2015-09-24 VITALS — BP 97/60 | HR 97 | Temp 97.7°F | Ht 68.0 in | Wt 127.6 lb

## 2015-09-24 DIAGNOSIS — N179 Acute kidney failure, unspecified: Secondary | ICD-10-CM | POA: Insufficient documentation

## 2015-09-24 DIAGNOSIS — Z87891 Personal history of nicotine dependence: Secondary | ICD-10-CM | POA: Diagnosis not present

## 2015-09-24 DIAGNOSIS — Z931 Gastrostomy status: Secondary | ICD-10-CM | POA: Insufficient documentation

## 2015-09-24 DIAGNOSIS — R269 Unspecified abnormalities of gait and mobility: Secondary | ICD-10-CM | POA: Diagnosis not present

## 2015-09-24 DIAGNOSIS — Z510 Encounter for antineoplastic radiation therapy: Secondary | ICD-10-CM | POA: Insufficient documentation

## 2015-09-24 DIAGNOSIS — C772 Secondary and unspecified malignant neoplasm of intra-abdominal lymph nodes: Secondary | ICD-10-CM | POA: Diagnosis not present

## 2015-09-24 DIAGNOSIS — C3492 Malignant neoplasm of unspecified part of left bronchus or lung: Secondary | ICD-10-CM | POA: Insufficient documentation

## 2015-09-24 DIAGNOSIS — H269 Unspecified cataract: Secondary | ICD-10-CM | POA: Insufficient documentation

## 2015-09-24 DIAGNOSIS — R1310 Dysphagia, unspecified: Secondary | ICD-10-CM | POA: Insufficient documentation

## 2015-09-24 DIAGNOSIS — I10 Essential (primary) hypertension: Secondary | ICD-10-CM | POA: Insufficient documentation

## 2015-09-24 DIAGNOSIS — M1991 Primary osteoarthritis, unspecified site: Secondary | ICD-10-CM | POA: Diagnosis not present

## 2015-09-24 MED ORDER — HYDROMORPHONE HCL 2 MG PO TABS: 2.0000 mg | ORAL_TABLET | ORAL | Status: DC | PRN

## 2015-09-24 NOTE — Progress Notes (Signed)
Radiation Oncology         [phone] ________________________________  Name: Mcadoo Muzquiz MRN: [id number]  Date: 09/24/2015  DOB: 1945-05-14  RA:[id number], Annie Main, MD  [name], MD     REFERRING PHYSICIAN: [name], MD   DIAGNOSIS: The encounter diagnosis was Bronchogenic cancer of left lung ([place]).   HISTORY OF PRESENT ILLNESS: Zylen Wenig is a 70 y.o. male seen at the request of Dr. [name]. The patient has a history of stage IV non-small cell lung cancer originally diagnosed in 2010. He has undergone multiple courses of chemotherapy, as well as several courses of palliative radiotherapy. In 2010 he received 30 Gy to the mediastinum in 12 fractions under the care of Dr. Gale Journey. He continued on chemotherapy regimens and was found to have a large abdominal mass along the pancreas and underwent palliative treatment to 30 Gy in 12 fractions between May and June of 2016. He also received radiotherapy to a right paratracheal lymph node and received 30 Gy in 10 fractions between September and November 2016. His most recent regimen has included immunotherapy with Nivolumab which was started in July 2016 but this was discontinued secondary to disease progression. He has been off therapy since the summer of 2016. He has been since followed with watch and wait approach, and was hospitalized due to sepsis from cholangitis in the spring of 2017. He did undergo exchange of a biliary stent in March 2017. Hs most recent CT scan of the chest abdomen and pelvis revealed improvement in his right peritracheal adenopathy, progressive disease in the retroperitoneal nodal bed as well as enlargement of the dominant retrocaval/sell as mass measuring 5.3 x 3.8 cm and had previously been 3.4 x 2.5 cm in January 2017. A left periaortic lymph node measured 1.2 cm in comparison to 9 mm, and stability of his pulmonary nodules and stability of his biliary stent placement were noted. He has undergone  CT-guided biopsy of this retroperitoneal adenopathy and final pathology has revealed metastatic poorly differentiated carcinoma negative testing for EGFR or out mutations, and PDL 1 is still pending at this time. He comes today for further recommendations of care with Dr. Lisbeth Renshaw to determine if there are any other palliative options for treating his retroperitoneal disease particularly as he is experiencing right inguinal pain. He is also contemplating resuming immunotherapy, and will follow-up with Dr. [name] at the end of July.    PREVIOUS RADIATION THERAPY: Yes   01/22/15-02/04/16: 30 Gy to a right paratracheal lymph node in 10 fractions  08/19/14-09/04/14: 30 Gy to the pancreas over 12 fractions  12/2008: completed palliative radiotherapy to the mediastinum with 30 Gy over 12 fractions   PAST MEDICAL HISTORY:  Past Medical History  Diagnosis Date  . Hypertension   . Arthritis RIGHT SHOULDER  . Immature cataract BILATERAL  . Acute meniscal tear of knee RIGHT KNEE  . BPH (benign prostatic hypertrophy)   . Dizziness and giddiness 04/23/2014  . Tremor 04/23/2014    Right hand  . HOH (hard of hearing)     bilateral hearing aids  . Radiation 01/08/09-01/21/09    Mediastinum 30 Gy x 12 fractions  . Radiation 08/19/14-09/04/14    Palliative RT Porta hepatic LN 30 Gy  . Pneumonia   . Abnormality of gait 03/12/2015  . Shortness of breath dyspnea     with exertion  . Orthostatic hypotension     B/P tends to run 110/70 or less -using med to raise B/P.  Marland Kitchen ARF (acute renal failure) (  [place]) 03/2015    pre-renal patient was septic  . 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 Swallowing impairment     "Unable to swallow"  . Non-small cell lung cancer ([place]) DX SEPT 2010  W/ CHEMORADIATION AT THAT TIME -- NOW  W/ METS--  CURRENTLY ON MAINTENANCE CHEMO TX  EVERY 44 DAYS    ONCOLOGIST- DR [facility]- lung cancer with metastasis.  . Sepsis ([place])  03/2015       PAST SURGICAL HISTORY: Past Surgical History  Procedure Laterality Date  . Rotator cuff repair  2008    LEFT SHOULDER  . Bilateral ear drum surgery  1960'S  . Orif left ring finger  and revascularization of radial side  02-16-2007    DEGLOVING INJURY  . Knee arthroscopy  12/21/2011    Procedure: ARTHROSCOPY KNEE;  Surgeon: Tobi Bastos, MD;  Location: [facility];  Service: Orthopedics;  Laterality: Right;  WITH MEDIAL MENISECTOMY  . Cataract extraction Bilateral   . Tonsillectomy      as child  . Shoulder open rotator cuff repair Right 06/05/2014    Procedure: RIGHT ROTATOR CUFF REPAIR SHOULDER OPEN;  Surgeon: Latanya Maudlin, MD;  Location: [facility];  Service: Orthopedics;  Laterality: Right;  . Ercp N/A 08/09/2014    Procedure: ENDOSCOPIC RETROGRADE CHOLANGIOPANCREATOGRAPHY (ERCP);  Surgeon: Carol Ada, MD;  Location: [facility];  Service: Endoscopy;  Laterality: N/A;  . Eus N/A 08/09/2014    Procedure: UPPER ENDOSCOPIC ULTRASOUND (EUS) RADIAL;  Surgeon: Carol Ada, MD;  Location: [facility];  Service: Endoscopy;  Laterality: N/A;  . Fine needle aspiration N/A 08/09/2014    Procedure: FINE NEEDLE ASPIRATION (FNA) LINEAR;  Surgeon: Carol Ada, MD;  Location: [facility];  Service: Endoscopy;  Laterality: N/A;  . Esophagogastroduodenoscopy (egd) with propofol N/A 08/09/2014    Procedure: ESOPHAGOGASTRODUODENOSCOPY (EGD) WITH PROPOFOL;  Surgeon: Carol Ada, MD;  [facility]: [facility];  Service: Endoscopy;  Laterality: N/A;  . Bile duct stent placement    . Gastrostomy tube placement  10/2014  . [place] cath    . Laryngoscopy  01/2015 and 04/21/15  . Ercp N/A 05/23/2015    Procedure: ENDOSCOPIC RETROGRADE CHOLANGIOPANCREATOGRAPHY (ERCP);  Surgeon: Carol Ada, MD;  Location: [facility] ENDOSCOPY;  Service: Endoscopy;  Laterality: N/A;  . Portacath placement  Removed 06/20/3015    as of 05-27-15 remains right chest  . Ercp N/A 05/30/2015    Procedure: ENDOSCOPIC  RETROGRADE CHOLANGIOPANCREATOGRAPHY (ERCP);  Surgeon: Carol Ada, MD;  Location: [facility];  Service: Endoscopy;  Laterality: N/A;     FAMILY HISTORY:  Family History  Problem Relation Age of Onset  . Colon cancer    . Heart attack    . Cancer    . Hypertension    . Hyperlipidemia    . Congestive Heart Failure Mother   . Cancer Sister   . Cancer Maternal Grandfather   . Colon cancer Father   . Heart attack Brother      SOCIAL HISTORY:  reports that he quit smoking about 34 years ago. His smoking use included Cigarettes. He has a 40 pack-year smoking history. He has never used smokeless tobacco. He reports that he does not drink alcohol or use illicit drugs. The patient lives in [place], [place]. He is originally from [place] and is accompanied by his wife.   ALLERGIES: Augmentin; Carboplatin; Levaquin; Percocet; Scopolamine; and Zolpidem tartrate   MEDICATIONS:  Current Outpatient Prescriptions  Medication  Sig Dispense Refill  . b complex vitamins tablet Take 1 tablet by mouth daily.    . diphenhydramine-acetaminophen (TYLENOL PM) 25-500 MG TABS tablet Take 1 tablet by mouth at bedtime as needed (sleep.).    Marland Kitchen LORazepam (ATIVAN) 1 MG tablet Take 1 mg by mouth at bedtime.    . midodrine (PROAMATINE) 5 MG tablet Take 5 mg by mouth 3 (three) times daily with meals.    . Multiple Vitamin (MULTIVITAMIN) tablet Take 1 tablet by mouth every morning.     . mupirocin ointment (BACTROBAN) 2 % Apply 1 application topically 3 (three) times daily. Around g tube area  0  . Nutritional Supplements (FEEDING SUPPLEMENT, OSMOLITE 1.5 CAL,) LIQD Begin Osmolite 1.5 with 2.0 at 40 cc/ hr continuosly current at 40 cc/ hr for now and gradually advance to 65 cc/ hr which is current goal via PEG tube. ( may take a week or wo to reach his goal)   Flush feeding tube with 240 cc free water 6 times daily. (Patient taking differently: 1,185 mLs by Gastric Tube route See admin instructions. Begin  Osmolite 1.5 with 2.0 at 50cc/ hr continuosly current cc.  Could not tolerate 29m /hr   45 ml sterile water per hour) 1422 mL 0  . ondansetron (ZOFRAN) 8 MG tablet Take 1 tablet (8 mg total) by mouth every 8 (eight) hours as needed for nausea or vomiting. 30 tablet 0  . prochlorperazine (COMPAZINE) 10 MG tablet Take 1 tablet (10 mg total) by mouth every 6 (six) hours as needed for nausea or vomiting. (Patient not taking: Reported on 09/22/2015) 30 tablet 1  . promethazine (PHENERGAN) 25 MG tablet Take 25 mg by mouth every 6 (six) hours as needed for nausea or vomiting.     No current facility-administered medications for this encounter.     REVIEW OF SYSTEMS: On review of systems, the patient reports that he denies swelling in his testicles or edema in his legs. He denies pain with bowel movements. He mentions the pain in his groin is sharp. The pain in his back is above his hip bone. The pain is in on specific spot and does not generally move. He mentions if he strains to urinate he has some pain but otherwise does not have pain urinating. He mentions the pian in his back is constant and the pain in his testicle is intermittent. Weight bearing and being mobile effects the pain in his testicle. He mentions he just finished antibiotics and has had some loose stools. He denies urinary and bowel incontinence. He mentions he does not have numbness in his legs. He denies any chest pain, shortness of breath, cough, fevers, chills, night sweats, unintended weight changes. He denies any bowel or bladder disturbances, and denies other abdominal pain, nausea or vomiting. A complete review of systems is obtained and is otherwise negative.   PHYSICAL EXAM:  height is 5' 8"  (1.727 m) and weight is 127 lb 9.6 oz (57.879 kg). His temperature is 97.7 F (36.5 C). His blood pressure is 97/60 and his pulse is 97. His oxygen saturation is 100%.   Pain scale 6-7/10 In general this is a well appearing [place] in  no acute distress. He is alert and oriented x4 and appropriate throughout the examination. HEENT reveals that the patient is normocephalic, atraumatic. EOMs are intact. PERRLA. Skin is intact without any evidence of gross lesions. Palpation of the spine and right hip does not reproduce pain. Cardiovascular exam reveals a regular  rate and rhythm, no clicks rubs or murmurs are auscultated. Chest is clear to auscultation bilaterally. Lymphatic assessment is performed and does not reveal any adenopathy in the cervical, supraclavicular, axillary, or inguinal chains. Abdomen has active bowel sounds in all quadrants and is intact. The abdomen is soft, non tender, non distended. Lower extremities are negative for pretibial pitting edema, deep calf tenderness, cyanosis or clubbing.     ECOG = 2  0 - Asymptomatic (Fully active, able to carry on all predisease activities without restriction)  1 - Symptomatic but completely ambulatory (Restricted in physically strenuous activity but ambulatory and able to carry out work of a light or sedentary nature. For example, light housework, office work)  2 - Symptomatic, <50% in bed during the day (Ambulatory and capable of all self care but unable to carry out any work activities. Up and about more than 50% of waking hours)  3 - Symptomatic, >50% in bed, but not bedbound (Capable of only limited self-care, confined to bed or chair 50% or more of waking hours)  4 - Bedbound (Completely disabled. Cannot carry on any self-care. Totally confined to bed or chair)  5 - Death   Eustace Pen MM, Creech RH, Tormey DC, et al. [phone]). "Toxicity and response criteria of the [facility]". [place] Oncol. 5 (6): 649-55    LABORATORY DATA:  Lab Results  Component Value Date   WBC 21.0* 09/08/2015   HGB 13.2 09/08/2015   HCT 39.6 09/08/2015   MCV 105.6* 09/08/2015   PLT 279 09/08/2015   Lab Results  Component Value Date   NA 135* 08/19/2015   K 4.9  08/19/2015   CL 102 06/20/2015   CO2 21* 08/19/2015   Lab Results  Component Value Date   ALT 137* 08/19/2015   AST 86* 08/19/2015   ALKPHOS 529* 08/19/2015   BILITOT 3.72* 08/19/2015      RADIOGRAPHY: Dg Op Swallowing Func-medicare/speech Path  09/16/2015  Objective Swallowing Evaluation: Type of Study: MBS-Modified Barium Swallow Study Patient Details Name: Haruo Stepanek MRN: [id number] Date of Birth: 12-30-1945 Today's Date: 09/16/2015 Time: SLP Start Time (ACUTE ONLY): 1310-SLP Stop Time (ACUTE ONLY): 1350 SLP Time Calculation (min) (ACUTE ONLY): 40 min Past Medical History: Past Medical History Diagnosis Date . Hypertension  . Arthritis RIGHT SHOULDER . Immature cataract BILATERAL . Acute meniscal tear of knee RIGHT KNEE . BPH (benign prostatic hypertrophy)  . Dizziness and giddiness 04/23/2014 . Tremor 04/23/2014   Right hand . HOH (hard of hearing)    bilateral hearing aids . Radiation 01/08/09-01/21/09   Mediastinum 30 Gy x 12 fractions . Radiation 08/19/14-09/04/14   Palliative RT Porta hepatic LN 30 Gy . Pneumonia  . Abnormality of gait 03/12/2015 . Shortness of breath dyspnea    with exertion . Orthostatic hypotension    B/P tends to run 110/70 or less -using med to raise B/P. Marland Kitchen ARF (acute renal failure) ([place]) 03/2015   pre-renal patient was septic . Gastrostomy tube dependent (HCC)    Continous feedings per Gastostomy tube"unable to swallow" . Swallowing impairment    "Unable to swallow" . Non-small cell lung cancer ([place]) DX SEPT 2010  W/ CHEMORADIATION AT THAT TIME -- NOW  W/ METS--  CURRENTLY ON MAINTENANCE CHEMO TX  EVERY 75 DAYS   ONCOLOGIST- DR [facility]- lung cancer with metastasis. . Sepsis ([place]) 03/2015 Past Surgical History: Past Surgical History Procedure Laterality Date . Rotator cuff repair  2008   LEFT SHOULDER . Bilateral ear drum  surgery  1960'S . Orif left ring finger  and revascularization of radial side  02-16-2007   DEGLOVING INJURY . Knee arthroscopy  12/21/2011   Procedure:  ARTHROSCOPY KNEE;  Surgeon: Tobi Bastos, MD;  [facility];  Service: Orthopedics;  Laterality: Right;  WITH MEDIAL MENISECTOMY . Cataract extraction Bilateral  . Tonsillectomy     as child . Shoulder open rotator cuff repair Right 06/05/2014   Procedure: RIGHT ROTATOR CUFF REPAIR SHOULDER OPEN;  Surgeon: Latanya Maudlin, MD;  Location: [facility];  Service: Orthopedics;  Laterality: Right; . Ercp N/A 08/09/2014   Procedure: ENDOSCOPIC RETROGRADE CHOLANGIOPANCREATOGRAPHY (ERCP);  Surgeon: Carol Ada, MD;  Location: [facility];  Service: Endoscopy;  Laterality: N/A; . Eus N/A 08/09/2014   Procedure: UPPER ENDOSCOPIC ULTRASOUND (EUS) RADIAL;  Surgeon: Carol Ada, MD;  Location: [facility];  Service: Endoscopy;  Laterality: N/A; . Fine needle aspiration N/A 08/09/2014   Procedure: FINE NEEDLE ASPIRATION (FNA) LINEAR;  Surgeon: Carol Ada, MD;  Location: [facility];  Service: Endoscopy;  Laterality: N/A; . Esophagogastroduodenoscopy (egd) with propofol N/A 08/09/2014   Procedure: ESOPHAGOGASTRODUODENOSCOPY (EGD) WITH PROPOFOL;  Surgeon: Carol Ada, MD;  [facility];  Service: Endoscopy;  Laterality: N/A; . Bile duct stent placement   . Gastrostomy tube placement  10/2014 . [place] cath   . Laryngoscopy  01/2015 and 04/21/15 . Ercp N/A 05/23/2015   Procedure: ENDOSCOPIC RETROGRADE CHOLANGIOPANCREATOGRAPHY (ERCP);  Surgeon: Carol Ada, MD;  Location[facility];  Service: Endoscopy;  Laterality: N/A; . Portacath placement  Removed 06/20/3015   as of 05-27-15 remains right chest . Ercp N/A 05/30/2015   Procedure: ENDOSCOPIC RETROGRADE CHOLANGIOPANCREATOGRAPHY (ERCP);  Surgeon: Carol Ada, MD;  Location: [facility];  Service: Endoscopy;  Laterality: N/A; HPI: pt is a 70 yo with complex medical hx including metastatic lung cancer s/p radiation tx over 7 years - most recent near throat region per spouse finishing in October 2016.  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  Pt reports he has only been consuming water (approx 3 ounces daily) by mouth and relies on tube feeding for nutritional support.  He admits to occasionally coughing on liquids - sensing it "going the wrong way" and residuals in throat when he tries solid foods.  Pt has seen a [facility] SLP once but his wife stated he had a lot going on and did not continue SLP after one visit.  Pt's wife admits to his voice becoming more hoarse recently- within the last few days.  Pt admits to trying some foods this week but having to expectorate solids.   Subjective: pt alert, sitting in chair - wife present Assessment / Plan / Recommendation CHL IP CLINICAL IMPRESSIONS 09/16/2015 Therapy Diagnosis Severe pharyngeal phase dysphagia Clinical Impression Pt presents with severe pharyngeal *suspected also cervical esophageal* dysphagia with sensorimotor deficits.  Pt with severely impaired hyolaryngeal motility and tongue base retraction resulting in gross residuals across all consistencies. Pt with laryngeal penetration of liquid barium that is retained in pharynx with further swallow attempts.  No aspiration observed during MBS.  Various postures including chin tuck and head turn left did not improve clearance.  Pt did expectorate per SLP cue and removed vallecular stasis of cracker/liquid.  He did not clear cracker residual mixed with secretions in pyriform sinus despite multiple  swallows - thin water.  Pt did have subtle cough after thin water swallow - presumed due to penetraiton/aspiration.  Recommend pt continue NPO except water after oral care to decrease disuse muscle atrophy.  Using live video, educated pt/spouse.  Follow up SLP for dysphagia treatment recommended to see if pt can get some functional return.  Pt and spouse agreeable to results of testing and  recommendations.    Impact on safety and function Severe aspiration risk;Risk for inadequate nutrition/hydration   No flowsheet data found.  No flowsheet data found. CHL IP DIET RECOMMENDATION 09/16/2015 SLP Diet Recommendations NPO;Ice chips PRN after oral care;Other (Comment)[place] Water Protocol Liquid Administration via Cup Medication Administration Via peg Compensations Slow rate;Small sips/bites;Multiple dry swallows after each bite/sip;Effortful swallow; expectorate after intake  Postural Changes Remain semi-upright after after feeds/meals (Comment);Seated upright at 90 degrees   CHL IP OTHER RECOMMENDATIONS 09/16/2015 Recommended Consults Other (Comment) Oral Care Recommendations Oral care BID Other Recommendations --   CHL IP FOLLOW UP RECOMMENDATIONS 09/16/2015 Follow up Recommendations (No Data)   No flowsheet data found.     CHL IP ORAL PHASE 09/16/2015 Oral Phase WFL Oral - Pudding Teaspoon -- Oral - Pudding Cup -- Oral - Honey Teaspoon -- Oral - Honey Cup -- Oral - Nectar Teaspoon -- Oral - Nectar Cup WFL Oral - Nectar Straw -- Oral - Thin Teaspoon WFL Oral - Thin Cup WFL Oral - Thin Straw -- Oral - Puree -- Oral - Mech Soft WFL Oral - Regular -- Oral - Multi-Consistency -- Oral - Pill -- Oral Phase - Comment --  CHL IP PHARYNGEAL PHASE 09/16/2015 Pharyngeal Phase Impaired Pharyngeal- Pudding Teaspoon -- Pharyngeal -- Pharyngeal- Pudding Cup -- Pharyngeal -- Pharyngeal- Honey Teaspoon -- Pharyngeal -- Pharyngeal- Honey Cup -- Pharyngeal -- Pharyngeal- Nectar Teaspoon -- Pharyngeal -- Pharyngeal- Nectar Cup Reduced pharyngeal peristalsis;Reduced epiglottic inversion;Reduced anterior laryngeal mobility;Reduced laryngeal elevation;Reduced airway/laryngeal closure;Reduced tongue base retraction;Penetration/Apiration after swallow;Pharyngeal residue - valleculae;Pharyngeal residue - pyriform;Lateral channel residue;Compensatory strategies attempted (with notebox) Pharyngeal Material enters airway, remains ABOVE  vocal cords then ejected out Pharyngeal- Nectar Straw -- Pharyngeal -- Pharyngeal- Thin Teaspoon Reduced pharyngeal peristalsis;Reduced epiglottic inversion;Reduced anterior laryngeal mobility;Reduced laryngeal elevation;Reduced airway/laryngeal closure;Reduced tongue base retraction;Penetration/Apiration after swallow;Pharyngeal residue - valleculae;Pharyngeal residue - pyriform;Compensatory strategies attempted (with notebox) Pharyngeal Material enters airway, remains ABOVE vocal cords and not ejected out Pharyngeal- Thin Cup Reduced pharyngeal peristalsis;Reduced epiglottic inversion;Reduced anterior laryngeal mobility;Reduced laryngeal elevation;Reduced airway/laryngeal closure;Reduced tongue base retraction;Penetration/Apiration after swallow;Pharyngeal residue - valleculae;Pharyngeal residue - pyriform Pharyngeal Material enters airway, remains ABOVE vocal cords and not ejected out Pharyngeal- Thin Straw -- Pharyngeal -- Pharyngeal- Puree -- Pharyngeal -- Pharyngeal- Mechanical Soft Reduced pharyngeal peristalsis;Reduced epiglottic inversion;Reduced anterior laryngeal mobility;Reduced laryngeal elevation;Reduced airway/laryngeal closure;Reduced tongue base retraction;Pharyngeal residue - valleculae;Pharyngeal residue - pyriform Pharyngeal -- Pharyngeal- Regular -- Pharyngeal -- Pharyngeal- Multi-consistency -- Pharyngeal -- Pharyngeal- Pill -- Pharyngeal -- Pharyngeal Comment head turn left and chin tuck did not help swallow, dry swallows decreased amount of residuals but did not eliminate them, increased viscocity resulted in worsening residuals, pt fortunately able to expectorate and clear vallecular residuals  CHL IP CERVICAL ESOPHAGEAL PHASE 09/16/2015 Cervical Esophageal Phase Impaired Pudding Teaspoon -- Pudding Cup -- Honey Teaspoon -- Honey Cup -- Nectar Teaspoon -- Nectar Cup Reduced cricopharyngeal relaxation Nectar Straw -- Thin Teaspoon Reduced cricopharyngeal relaxation Thin Cup Reduced  cricopharyngeal relaxation Thin Straw Reduced cricopharyngeal relaxation Puree -- Mechanical Soft Reduced cricopharyngeal relaxation Regular -- Multi-consistency -- Pill -- Cervical Esophageal Comment -- CHL IP  GO 09/16/2015 Functional Assessment Tool Used mbs Functional Limitations Swallowing Swallow Current Status (Z6109) CM Swallow Goal Status (U0454) CM Swallow Discharge Status (U9811) CM Luanna Salk, MS [facility] SLP [phone] Macario Golds 09/16/2015, 3:27 PM            CLINICAL DATA:  70 year old male with lung cancer. PEG tube in place. Vocal cord paralysis post Teflon injection. Initial encounter. EXAM: MODIFIED BARIUM SWALLOW TECHNIQUE: Different consistencies of barium were administered orally to the patient by the Speech Pathologist. Imaging of the pharynx was performed in the lateral projection. FLUOROSCOPY TIME:  Radiation Exposure Index (as provided by the fluoroscopic device): 4.9 micro Gray Fluoroscopy Time:  1 minutes and 22 seconds. COMPARISON:  None. FINDINGS: Thin liquid- pooling within the vallecula. During clearing of barium within the vallecular, laryngeal penetration occurs. Nectar thick liquid- pooling within the vallecula. During clearing of barium within the vallecular, laryngeal penetration occurs. Pure with cracker- pooling within the vallecula. IMPRESSION: pooling within the vallecular with ingestion of thin liquid, nectar thick liquid and puree coated cracker. Laryngeal penetration occurs when patient attempts to clear barium contained within the vallecula. Please refer to the Speech Pathologists report for complete details and recommendations. Electronically Signed   By: Genia Del M.D.   On: 09/16/2015 14:16   Ct Biopsy  09/08/2015  CLINICAL DATA:  Left lung bronchogenic carcinoma. Enlarging right retroperitoneal mass. EXAM: CT GUIDED CORE BIOPSY OF RIGHT RETROPERITONEAL MASS ANESTHESIA/SEDATION: Intravenous Fentanyl and Versed were administered as conscious sedation during  continuous monitoring of the patient's level of consciousness and physiological / cardiorespiratory status by the radiology RN, with a total moderate sedation time of 8 minutes. PROCEDURE: The procedure risks, benefits, and alternatives were explained to the patient. Questions regarding the procedure were encouraged and answered. The patient understands and consents to the procedure. Patient placed prone. Select axial scans through the mid abdomen obtained. The lesion was localized an appropriate skin entry site was determined and marked. The operative field was prepped with chlorhexidinein a sterile fashion, and a sterile drape was applied covering the operative field. A sterile gown and sterile gloves were used for the procedure. Local anesthesia was provided with 1% Lidocaine. Under CT fluoroscopic guidance, a 17 gauge trocar needle was advanced to the margin of the lesion. Once needle tip position was confirmed, coaxial 18-gauge core biopsy samples were obtained, submitted in saline to surgical pathology. The guide needle was removed. The patient tolerated the procedure well. COMPLICATIONS: None immediate FINDINGS: Limited abdominal CT again demonstrated right retroperitoneal mass measuring at least 5 cm distorting the right psoas. Core biopsy samples obtained under CT fluoroscopic guidance. No post procedure hemorrhage or other apparent complication. IMPRESSION: 1. Technically successful CT-guided core biopsy of right retroperitoneal mass. Electronically Signed   By: Lucrezia Europe M.D.   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  PLAN : Dr. Lisbeth Renshaw discusses the findings from the patient's imaging studies and reviews his previous therapies as well as his symptoms. He  discusses the use of palliative radiotherapy to the retroperitoneal nodes. We will plan a total of 30 Gy in 10 fractions. We reviewed the risks, benefits, short, and long term effects of radiotherapy. He is in agreement. Written consent is obtained, and he will move  forward with simulation today with the anticipation of starting treatment next week. A new prescription is provided for oral dilaudid, and we will consider using long acting morphine if he is maxing out on his prn dosing of dilaudid.     The above documentation reflects my direct findings during this shared patient visit. Please see the separate note by Dr. Lisbeth Renshaw on this date for the remainder of the patient's plan of care.    Carola Rhine, PAC  This document serves as a record of services personally performed by the attending medical team. It was created on their behalf by Lendon Collar, a trained medical scribe. The creation of this record is based on the scribe's personal observations and the provider's statements to them. This document has been checked and approved by the attending provider.

## 2015-09-24 NOTE — Progress Notes (Addendum)
Reconsult  For palliative radiation  To enlarging retroperitoneal lymph nodes  Referred by Dr. [name] Non small  Lung cancer with metastasis   Diagnosis 09/08/2015: Retroperitoneal mass, biopsy, right - POSITIVE FOR POORLY DIFFERENTIATED CARCINOMA.   Past Radiation to Mediastinum 01/08/09-01/21/09 30Gyx 12 fractions Radiation Palliative right  Porta hepatic LLN 08/19/14-09/04/14  Pain right groin area - Lower back 6-7 pain level and right groin level 5 pain  Allergies: Augmentin,carboplatin,levaquin,percocet,scopolmine,zolpidem tartrate  Patient is a DNR   Travel by W/C.

## 2015-09-25 ENCOUNTER — Encounter: Payer: Self-pay | Admitting: *Deleted

## 2015-09-25 ENCOUNTER — Ambulatory Visit: Payer: Medicare Other | Attending: Family Medicine | Admitting: Speech Pathology

## 2015-09-25 ENCOUNTER — Encounter (HOSPITAL_COMMUNITY): Payer: Self-pay

## 2015-09-25 DIAGNOSIS — R1312 Dysphagia, oropharyngeal phase: Secondary | ICD-10-CM | POA: Insufficient documentation

## 2015-09-25 NOTE — Therapy (Signed)
[facility] [address] [place], [place], [address] Phone: [phone]   Fax:  [phone]  Speech Language Pathology Evaluation  Patient Details  Name: [name] MRN: [id number] Date of Birth: 18-Aug-1945 Referring Provider: Dr. Christella Noa  Encounter Date: 09/25/2015      End of Session - 09/25/15 1259    Visit Number 1   Number of Visits 17   Date for SLP Re-Evaluation 11/20/15   SLP Start Time 1103   SLP Stop Time  1148   SLP Time Calculation (min) 45 min      Past Medical History  Diagnosis Date  . Hypertension   . Arthritis RIGHT SHOULDER  . Immature cataract BILATERAL  . Acute meniscal tear of knee RIGHT KNEE  . BPH (benign prostatic hypertrophy)   . Dizziness and giddiness 04/23/2014  . Tremor 04/23/2014    Right hand  . HOH (hard of hearing)     bilateral hearing aids  . Radiation 01/08/09-01/21/09    Mediastinum 30 Gy x 12 fractions  . Radiation 08/19/14-09/04/14    Palliative RT Porta hepatic LN 30 Gy  . Pneumonia   . Abnormality of gait 03/12/2015  . Shortness of breath dyspnea     with exertion  . Orthostatic hypotension     B/P tends to run 110/70 or less -using med to raise B/P.  Mar[facility] ARF (acute renal failure) ([place]) 03/2015    pre-renal patient was septic  . 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 Swallowing impairment     "Unable to swallow"  . Non-small cell lung cancer ([place]) DX SEPT 2010  W/ CHEMORADIATION AT THAT TIME -- NOW  W/ METS--  CURRENTLY ON MAINTENANCE CHEMO TX  EVERY 62 DAYS    ONCOLOGIST- DR [facility]- lung cancer with metastasis.  . Sepsis ([place]) 03/2015    Past Surgical History  Procedure Laterality Date  . Rotator cuff repair  2008    LEFT SHOULDER  . Bilateral ear drum surgery  1960'S  . Orif left ring finger  and revascularization of radial side  02-16-2007    DEGLOVING INJURY  . Knee arthroscopy   12/21/2011    Procedure: ARTHROSCOPY KNEE;  Surgeon: Tobi Bastos, MD;  Location: [facility];  Service: Orthopedics;  Laterality: Right;  WITH MEDIAL MENISECTOMY  . Cataract extraction Bilateral   . Tonsillectomy      as child  . Shoulder open rotator cuff repair Right 06/05/2014    Procedure: RIGHT ROTATOR CUFF REPAIR SHOULDER OPEN;  Surgeon: Latanya Maudlin, MD;  Location: [facility];  Service: Orthopedics;  Laterality: Right;  . Ercp N/A 08/09/2014    Procedure: ENDOSCOPIC RETROGRADE CHOLANGIOPANCREATOGRAPHY (ERCP);  Surgeon: Carol Ada, MD;  Location: [facility];  Service: Endoscopy;  Laterality: N/A;  . Eus N/A 08/09/2014    Procedure: UPPER ENDOSCOPIC ULTRASOUND (EUS) RADIAL;  Surgeon: Carol Ada, MD;  Location: [facility];  Service: Endoscopy;  Laterality: N/A;  . Fine needle aspiration N/A 08/09/2014    Procedure: FINE NEEDLE ASPIRATION (FNA) LINEAR;  Surgeon: Carol Ada, MD;  Location: [facility];  Service: Endoscopy;  Laterality: N/A;  . Esophagogastroduodenoscopy (egd) with propofol N/A 08/09/2014    Procedure: ESOPHAGOGASTRODUODENOSCOPY (EGD) WITH PROPOFOL;  Surgeon: Carol Ada, MD;  Location: [facility];  Service: Endoscopy;  Laterality: N/A;  . Bile duct stent placement    . Gastrostomy tube placement  10/2014  . [place] cath    .  Laryngoscopy  01/2015 and 04/21/15  . Ercp N/A 05/23/2015    Procedure: ENDOSCOPIC RETROGRADE CHOLANGIOPANCREATOGRAPHY (ERCP);  Surgeon: Carol Ada, MD;  Location: [facility];  Service: Endoscopy;  Laterality: N/A;  . Portacath placement  Removed 06/20/3015    as of 05-27-15 remains right chest  . Ercp N/A 05/30/2015    Procedure: ENDOSCOPIC RETROGRADE CHOLANGIOPANCREATOGRAPHY (ERCP);  Surgeon: Carol Ada, MD;  Location: [facility];  Service: Endoscopy;  Laterality: N/A;    There were no vitals filed for this visit.          SLP Evaluation [facility] - 09/25/15 1117    SLP Visit Information   SLP Received On 09/25/15    Referring Provider Dr. Christella Noa   Onset Date 11/12/14   Medical Diagnosis cancer   Subjective   Subjective "I want to see if I can eat again"   Pain Assessment   Currently in Pain? Yes   Pain Score 6    Pain Location Back   Pain Orientation Right   Pain Frequency Constant   Pain Relieving Factors position   General Information   HPI pt is a 70 yo with complex medical hx including metastatic lung cancer s/p radiation tx over 7 years - most recent near throat region per spouse finishing in October 2016.  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  Pt reports he has only been consuming water (approx 3 ounces daily) by mouth and relies on tube feeding for nutritional support.  He admits to occasionally coughing on liquids - sensing it "going the wrong way" and residuals in throat when he tries solid foods.  Pt has seen a [place] SLP once but his wife stated he had a lot going on and did not continue SLP after one visit.  Pt's wife admits to his voice becoming more hoarse recently- within the last few days.  Pt admits to trying some foods this week but having to expectorate solids.  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 and mild dysphonia   Behavioral/Cognition reduced STM recently   Mobility Status uses walker due to vestibular issues   Prior Functional Status   Cognitive/Linguistic Baseline Baseline deficits   Baseline deficit details decreased memory    Type of Home House    Lives With Spouse   Available Support Family   Vocation Retired   Oral Motor/Sensory Function   Overall Oral Motor/Sensory Function Appears within functional limits for tasks assessed   Overall Oral Motor/Sensory Function larynx appears to deviate right   Motor Speech   Phonation Breathy;Hoarse  h/o right vocal cord  n damage            General - 09/25/15 1302    General Information   Type of Study Bedside Swallow Evaluation   Previous Swallow Assessment MBSS 09/16/15 , Severe pharyngeal phase dysphagia - Pt presents with severe pharyngeal *suspected also cervical esophageal* dysphagia with sensorimotor deficits. Pt with severely impaired hyolaryngeal motility and tongue base retraction resulting in gross residuals across all consistencies. Pt with laryngeal penetration of liquid barium that is retained in pharynx with further swallow attempts. No aspiration observed during MBS. Various postures including chin tuck and head turn left did not improve clearance. Pt did expectorate per SLP cue and removed vallecular stasis of cracker/liquid. He  did not clear cracker residual mixed with secretions in pyriform sinus despite multiple swallows - thin water. Pt did have subtle cough after thin water swallow - presumed due to penetraiton/aspiration. Recommend pt continue NPO except water after oral care to decrease disuse muscle atrophy. Follow up SLP for dysphagia treatment recommended to see if pt can get some functional return. Pt and spouse agreeable to results of testing and    Respiratory Status Room air   Behavior/Cognition Alert;Cooperative;Pleasant mood   Oral Cavity Assessment Within Functional Limits   Oral Care Completed by SLP No   Oral Cavity - Dentition Dentures, top;Adequate natural dentition   Self-Feeding Abilities Able to feed self   Patient Positioning Upright in chair   Volitional Cough Strong   Volitional Swallow Able to elicit          Oral Motor/Sensory Function - 09/25/15 1312    Oral Motor/Sensory Function   Overall Oral Motor/Sensory Function Other (comment)  larynx appears to deviate right          Thin Liquid - 09/25/15 1259    Thin Liquid   Thin Liquid Impaired   Presentation Cup   Pharyngeal  Phase Impairments Throat Clearing - Immediate;Cough -  Immediate;Multiple swallows;Cough - Delayed;Decreased hyoid-laryngeal movement                 SLP Education - 09/25/15 1458    Education provided Yes   Education Details dysphagia HEP, s/s of aspiration pna, free water protocol   Person(s) Educated Patient;Spouse   Methods Explanation;Demonstration;Verbal cues;Tactile cues;Handout   Comprehension Verbalized understanding;Returned demonstration          SLP Short Term Goals - 09/25/15 1310    SLP SHORT TERM GOAL #1   Title Pt will perform dysphagia HEP with occasional min A   Time 4   Period Weeks   Status New   SLP SHORT TERM GOAL #2   Title Pt will demonstrate/verbalize precautions for free water protocol with occasional min A   Time 4   Period Weeks   Status New          SLP Long Term Goals - 09/25/15 1311    SLP LONG TERM GOAL #1   Title Pt will perform swallow HEP with rare min A   Time 8   Period Weeks   Status New   SLP LONG TERM GOAL #2   Title Pt will demonstrate clinical gains/readiness for repeat objective swallowing assessment prior to initiation of PO   Time 8   Period Weeks   Status New          Plan - 09/25/15 1305    Clinical Impression Statement Mr. Sayre presents today for follow up for dysphagia therapy s/p MBSS which revleaed severe pharyngeal dysphagia. Pt has had feeding tube for about a year, and has not taken PO due to inability to swallow. Pt and spouse verbalized free water protocol. He had completed oral care prior to this session. PO trials of small liquid sips revealed significant s/s of pharyngeal dysphagia consistent with MBSS. Initalted training in dysphagia HEP and further training on free water protocol. I recommend skilled ST to maximize ability for PO intake with minimal aspiration risk   Speech Therapy Frequency 2x / week   Duration --  8 weeks   Treatment/Interventions SLP instruction and feedback;Internal/external aids;Compensatory strategies;Pharyngeal  strengthening exercises;Oral motor exercises;Trials of upgraded texture/liquids;Patient/family education;Diet toleration management by SLP;Aspiration precaution training   Potential to [place]  Potential Considerations Severity of impairments;Pain level;Co-morbidities      Patient will benefit from skilled therapeutic intervention in order to improve the following deficits and impairments:   Dysphagia, oropharyngeal phase - Plan: SLP plan of care cert/re-cert      G-Codes - 97/02/63 1313    Functional Assessment Tool Used NOMS   Functional Limitations Swallowing   Swallow Current Status (Z8588) At least 80 percent but less than 100 percent impaired, limited or restricted   Swallow Goal Status (F0277) At least 60 percent but less than 80 percent impaired, limited or restricted      Problem List Patient Active Problem List   Diagnosis Date Noted  . Bacteremia due to Klebsiella pneumoniae 04/29/2015  . Infection due to Enterobacter cloacae 04/29/2015  . Hypokalemia 04/29/2015  . Gram-negative bacteremia ([place]) 04/25/2015  . Septic shock ([place]) 04/23/2015  . Malignant neoplasm of lung ([place])   . Sepsis ([place])   . Acute kidney injury ([place])   . Abnormality of gait 03/12/2015  . Lung cancer ([place])   . Protein-calorie malnutrition, severe ([place]) 11/26/2014  . Hypoalbuminemia 11/18/2014  . Leukocytosis 11/18/2014  . Dysphagia 11/18/2014  . Nausea with vomiting 10/03/2014  . Central line complication 41/28/7867  . Anorexia 10/03/2014  . Weight loss 10/03/2014  . Transaminitis 10/03/2014  . Hyperphosphatemia 10/03/2014  . Encounter for antineoplastic immunotherapy 09/28/2014  . Non-small cell lung cancer ([place]) 09/21/2014  . Abdominal pain   . Fever   . 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 Biliary obstruction   . Fatigue 08/27/2014  . Abdominal pain, acute   . Abdominal mass   . Abdominal pain, generalized 08/07/2014  . CRF (chronic renal  failure) 08/07/2014  . Obstructive jaundice 08/07/2014  . Malnutrition of moderate degree ([place]) 08/07/2014  . Obstructive jaundice due to cancer 08/06/2014  . Rotator cuff tear, non-traumatic 06/05/2014  . Dizziness and giddiness 04/23/2014  . Tremor 04/23/2014  . Acute on chronic renal failure ([place]) 05/24/2013  . Pancytopenia ([place]) 05/24/2013  . Osteoarthritis of right knee 12/21/2011  . Meniscus, medial, posterior horn derangement 12/21/2011  . Dyspnea 12/04/2010  . Bronchogenic cancer of left lung ([place]) 12/11/2008  . ARTHRITIS 12/10/2008    Lovvorn, Annye Rusk MS, CCC-SLP 09/25/2015, 3:00 PM  [facility] [address] [place], [place], [address] Phone: [phone]   Fax:  [phone]  Name: Huckleberry Martinson MRN: [id number] Date of Birth: [birth year]

## 2015-09-25 NOTE — Addendum Note (Signed)
Addended by: Georgiann Hahn A on: 09/25/2015 03:10 PM   Modules accepted: Orders

## 2015-09-25 NOTE — Patient Instructions (Addendum)
SWALLOWING EXERCISES 1. Effortful Swallows - Squeeze hard with the muscles in your neck while you swallow your  saliva or a sip of water - Repeat 20 times, 2-3 times a day, and use whenever you eat or drink  2. Masako Swallow - swallow with your tongue sticking out - Stick tongue out and gently bite tongue with your teeth - Swallow, while holding your tongue with your teeth - Repeat 20 times, 2-3 times a day  3. Pitch Raise - Repeat "he", once per second in as high of a pitch as you can - Repeat 20 times, 2-3 times a day  4. Shaker Exercise - head lift - Lie flat on your back in your bed or on a couch without pillows - Raise your head and look at your feet  - KEEP YOUR SHOULDERS DOWN - HOLD FOR 45 SECONDS, then lower your head back down - Repeat 3 times, 2-3 times a day  5. Mendelsohn Maneuver - "half swallow" exercise - Start to swallow, and keep your Adam's apple up by squeezing hard with the muscles of the throat - Hold the squeeze for 5-7 seconds and then relax - Repeat 20 times, 2-3 times a day  6. Tongue Press - Press your entire tongue as hard as you can against the roof of your mouth for 3-5 seconds - Repeat 20 times, 2-3 times a day  7. Tongue Stretch/Teeth Clean - Move your tongue around the pocket between your gums and teeth, clockwise and then counter-clockwise - Repeat on the back side, clockwise and then counter-clockwise - Repeat 15-20 times, 2-3 times a day  8. Breath Hold - Say "HUH!" loudly, holding your breath tightly at the level of your voice box for 3 seconds - Repeat 20 times, 2-3 times a day  9. Chin pushback - Open your mouth  - Place your fist UNDER your chin near your neck, and push back with your fist for 5 seconds  - Repeat 20 times, 2-3 times a day     10. Open mouth swallow          -Swallow with your mouth open wide          -Repeat 15 times, 2 times a day  11. Stick out your tongue and say "GA-GA-GA" loud and shart           -Repeat  25 sets of 3, 2-3 times a day  12. Say ING-GA loud and exaggerated             - 25 times 2-3 times a day  13. Gargle or pretend to gargle             - 10 times for 3-5 seconds (or as long as you can) 2 times a day     Signs of Aspiration Pneumonia   . Chest pain/tightness . Fever (can be low grade) . Cough  o With foul-smelling phlegm (sputum) o With sputum containing pus or blood o With greenish sputum . Fatigue   . Shortness of breath  . Wheezing   **IF YOU HAVE THESE SIGNS, CONTACT YOUR DOCTOR OR GO TO THE EMERGENCY DEPARTMENT OR URGENT CARE AS SOON AS POSSIBLE**

## 2015-09-25 NOTE — Progress Notes (Signed)
[facility] Psychosocial Distress Screening Clinical Social Work  Clinical Social Work was referred by distress screening protocol.  The patient scored a 7 on the Psychosocial Distress Thermometer which indicates moderate distress. Clinical Social Worker attempted to contact patient by phone to assess for distress and other psychosocial needs. CSW left voicemail to return call.  ONCBCN DISTRESS SCREENING 09/24/2015  Screening Type Change in Status  Distress experienced in past week (1-10) 7  Physical Problem type Pain;Nausea/vomiting;Sleep/insomnia;Getting around;Mouth sores/swallowing  Physician notified of physical symptoms Yes  Referral to clinical social work Yes    Polo Riley, MSW, LCSW, OSW-C Clinical Social Worker [facility] [phone]

## 2015-09-26 ENCOUNTER — Telehealth: Payer: Self-pay | Admitting: *Deleted

## 2015-09-26 ENCOUNTER — Encounter (HOSPITAL_COMMUNITY): Payer: Self-pay

## 2015-09-26 DIAGNOSIS — Z510 Encounter for antineoplastic radiation therapy: Secondary | ICD-10-CM | POA: Diagnosis not present

## 2015-09-26 NOTE — Telephone Encounter (Signed)
Called the patient back and he stated he took benadryl which helped his itching, and he states he isn't confused ,, will not take anymore Morphine , I suggested claritin for itching also but he stated benadryl helped, I doid mention that benadryl  can make him drowsy, but he said he was fine  12:10 PM

## 2015-09-26 NOTE — Telephone Encounter (Signed)
Wife called  Stating  The dilaudid is causing itching and confusion, asked how  Much medication she was giving the patient, Yesterday at 22 she gave him morphine '15mg'$  ER and '2mg'$  dilaudid , then at noon '2mg'$  dlaudid, 5pm '2mg'$  dilaudid, and 10 00pm '4mg'$  dilaudid, this am '2mg'$  dilaudid,  Will speak with Bryson Ha and call her back, the Morphine Er was from last years rx, it isn't listed in epic,  10:18 AM

## 2015-09-26 NOTE — Telephone Encounter (Signed)
I asked her not to give him an morphine, because we don't know how much dilaudid he actually needs. That could have started more issues with confusion and the itching. Is the itching any better now, or has this continued? I would suggest claritin po if he's having itching still. Let me know what you find out then we can go from there.

## 2015-09-29 ENCOUNTER — Encounter: Payer: Self-pay | Admitting: Radiation Oncology

## 2015-09-29 ENCOUNTER — Other Ambulatory Visit: Payer: Self-pay | Admitting: Radiation Oncology

## 2015-09-29 ENCOUNTER — Ambulatory Visit
Admission: RE | Admit: 2015-09-29 | Discharge: 2015-09-29 | Disposition: A | Discharge status: Home / Self Care | Payer: Medicare Other | Source: Ambulatory Visit | Attending: Radiation Oncology | Admitting: Radiation Oncology

## 2015-09-29 DIAGNOSIS — Z510 Encounter for antineoplastic radiation therapy: Secondary | ICD-10-CM | POA: Diagnosis not present

## 2015-09-29 MED ORDER — FENTANYL 25 MCG/HR TD PT72: 25.0000 ug | MEDICATED_PATCH | TRANSDERMAL | Status: DC

## 2015-10-01 ENCOUNTER — Ambulatory Visit
Admission: RE | Admit: 2015-10-01 | Discharge: 2015-10-01 | Disposition: A | Discharge status: Home / Self Care | Payer: Medicare Other | Source: Ambulatory Visit | Attending: Radiation Oncology | Admitting: Radiation Oncology

## 2015-10-01 DIAGNOSIS — Z510 Encounter for antineoplastic radiation therapy: Secondary | ICD-10-CM | POA: Diagnosis not present

## 2015-10-02 ENCOUNTER — Ambulatory Visit
Admission: RE | Admit: 2015-10-02 | Discharge: 2015-10-02 | Disposition: A | Discharge status: Home / Self Care | Payer: Medicare Other | Source: Ambulatory Visit | Attending: Radiation Oncology | Admitting: Radiation Oncology

## 2015-10-02 DIAGNOSIS — Z510 Encounter for antineoplastic radiation therapy: Secondary | ICD-10-CM | POA: Diagnosis not present

## 2015-10-03 ENCOUNTER — Ambulatory Visit
Admission: RE | Admit: 2015-10-03 | Discharge: 2015-10-03 | Disposition: A | Discharge status: Home / Self Care | Payer: Medicare Other | Source: Ambulatory Visit | Attending: Radiation Oncology | Admitting: Radiation Oncology

## 2015-10-03 ENCOUNTER — Encounter: Payer: Self-pay | Admitting: Radiation Oncology

## 2015-10-03 VITALS — BP 98/67 | HR 87 | Temp 97.5°F | Resp 20 | Wt 128.0 lb

## 2015-10-03 DIAGNOSIS — C772 Secondary and unspecified malignant neoplasm of intra-abdominal lymph nodes: Secondary | ICD-10-CM

## 2015-10-03 DIAGNOSIS — Z510 Encounter for antineoplastic radiation therapy: Secondary | ICD-10-CM | POA: Diagnosis not present

## 2015-10-03 MED ORDER — CEPHALEXIN 500 MG PO CAPS: 500.0000 mg | ORAL_CAPSULE | Freq: Four times a day (QID) | ORAL | Status: DC

## 2015-10-03 NOTE — Progress Notes (Signed)
Weekly rad txs retroperitoneal 4/10 completed, no c/o nausea, diarrhea or abdominal gas, feeding tube checked, red around incision, yellow thick drainage, he gets osmolite via peg tube, can take sips of water, most pills crushed and given via peg tube, free water before and after meals and pills, fatigued,  1:33 PM BP 98/67 mmHg  Pulse 87  Temp(Src) 97.5 F (36.4 C) (Oral)  Resp 20  Wt 128 lb (58.06 kg)  Wt Readings from Last 3 Encounters:  10/03/15 128 lb (58.06 kg)  09/24/15 127 lb 9.6 oz (57.879 kg)  09/22/15 125 lb 4.8 oz (56.836 kg)

## 2015-10-03 NOTE — Progress Notes (Signed)
Department of Radiation Oncology  Phone:  [phone] Fax:        [phone]  Weekly Treatment Note    Name: [name] Date: 10/03/2015 MRN: [id number] DOB: 01/28/1946   Diagnosis:     ICD-9-CM ICD-10-CM   1. 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 b complex vitamins tablet Take 1 tablet by mouth daily.    . diphenhydramine-acetaminophen (TYLENOL PM) 25-500 MG TABS tablet Take 1 tablet by mouth at bedtime as needed (sleep.).    Marland Kitchen fentaNYL (DURAGESIC - DOSED MCG/HR) 25 MCG/HR patch Place 1 patch (25 mcg total) onto the skin every 3 (three) days. 10 patch 0  . HYDROmorphone (DILAUDID) 2 MG tablet Take 1-2 tablets (2-4 mg total) by mouth every 4 (four) hours as needed for severe pain. Reported on 09/24/2015 90 tablet 0  . LORazepam (ATIVAN) 1 MG tablet Take 1 mg by mouth at bedtime. Reported on 09/24/2015    . midodrine (PROAMATINE) 5 MG tablet Take 5 mg by mouth 3 (three) times daily with meals.    . Multiple Vitamin (MULTIVITAMIN) tablet Take 1 tablet by mouth every morning.     . mupirocin ointment (BACTROBAN) 2 % Apply 1 application topically 3 (three) times daily. Around g tube area  0  . Nutritional Supplements (FEEDING SUPPLEMENT, OSMOLITE 1.5 CAL,) LIQD Begin Osmolite 1.5 with 2.0 at 40 cc/ hr continuosly current at 40 cc/ hr for now and gradually advance to 65 cc/ hr which is current goal via PEG tube. ( may take a week or wo to reach his goal)   Flush feeding tube with 240 cc free water 6 times daily. (Patient taking differently: 1,185 mLs by Gastric Tube route See admin instructions. Begin Osmolite 1.5 with 2.0 at 50cc/ hr continuosly current cc.  Could not tolerate 64m /hr   45 ml sterile water per hour) 1422 mL 0  . ondansetron (ZOFRAN) 8 MG tablet Take 1 tablet (8 mg total) by mouth every 8 (eight) hours  as needed for nausea or vomiting. 30 tablet 0  . prochlorperazine (COMPAZINE) 10 MG tablet Take 1 tablet (10 mg total) by mouth every 6 (six) hours as needed for nausea or vomiting. 30 tablet 1  . promethazine (PHENERGAN) 25 MG tablet Take 25 mg by mouth every 6 (six) hours as needed for nausea or vomiting.    . cephALEXin (KEFLEX) 500 MG capsule Take 1 capsule (500 mg total) by mouth 4 (four) times daily. 28 capsule 0   No current facility-administered medications for this encounter.     ALLERGIES: Augmentin; Carboplatin; Levaquin; Percocet; Scopolamine; and Zolpidem tartrate   LABORATORY DATA:  Lab Results  Component Value Date   WBC 21.0* 09/08/2015   HGB 13.2 09/08/2015   HCT 39.6 09/08/2015   MCV 105.6* 09/08/2015   PLT 279 09/08/2015   Lab Results  Component Value Date   NA 135* 08/19/2015   K 4.9 08/19/2015   CL 102 06/20/2015   CO2 21* 08/19/2015   Lab Results  Component Value Date   ALT 137* 08/19/2015   AST 86* 08/19/2015   ALKPHOS 529* 08/19/2015   BILITOT 3.72* 08/19/2015     NARRATIVE: RSallie Makerwas seen today for weekly treatment management. The chart was checked and the patient's films were reviewed.  Weekly rad txs retroperitoneal 4/10 completed. No  c/o nausea, diarrhea or abdominal gas. He gets osmolite via peg tube. Can take sips of water. Most pills crushed and given via peg tube. Free water before and after meals and pills. He reports feeling overly tired. He currently uses antibiotic cream around his feeding tube. He has noticed drainage coming from the feeding tube site.   PHYSICAL EXAMINATION: weight is 128 lb (58.06 kg). His oral temperature is 97.5 F (36.4 C). His blood pressure is 98/67 and his pulse is 87. His respiration is 20.      Presents in wheelchair. Some erythema is present surrounding the feeding tube with some discharge.  ASSESSMENT: The patient is doing satisfactorily with treatment.  PLAN: We will continue with the patient's  radiation treatment as planned. I have written the patient a prescription for keflex.    ------------------------------------------------  Jodelle Gross, MD, PhD  This document serves as a record of services personally performed by Kyung Rudd, MD. It was created on his behalf by Arlyce Harman, a trained medical scribe. The creation of this record is based on the scribe's personal observations and the provider's statements to them. This document has been checked and approved by the attending provider.

## 2015-10-06 ENCOUNTER — Ambulatory Visit: Payer: Medicare Other | Admitting: Radiation Oncology

## 2015-10-06 ENCOUNTER — Ambulatory Visit
Admission: RE | Admit: 2015-10-06 | Discharge: 2015-10-06 | Disposition: A | Discharge status: Home / Self Care | Payer: Medicare Other | Source: Ambulatory Visit | Attending: Radiation Oncology | Admitting: Radiation Oncology

## 2015-10-06 DIAGNOSIS — Z510 Encounter for antineoplastic radiation therapy: Secondary | ICD-10-CM | POA: Diagnosis not present

## 2015-10-07 ENCOUNTER — Ambulatory Visit
Admission: RE | Admit: 2015-10-07 | Discharge: 2015-10-07 | Disposition: A | Discharge status: Home / Self Care | Payer: Medicare Other | Source: Ambulatory Visit | Attending: Radiation Oncology | Admitting: Radiation Oncology

## 2015-10-07 DIAGNOSIS — Z510 Encounter for antineoplastic radiation therapy: Secondary | ICD-10-CM | POA: Diagnosis not present

## 2015-10-08 ENCOUNTER — Ambulatory Visit
Admission: RE | Admit: 2015-10-08 | Discharge: 2015-10-08 | Disposition: A | Discharge status: Home / Self Care | Payer: Medicare Other | Source: Ambulatory Visit | Attending: Radiation Oncology | Admitting: Radiation Oncology

## 2015-10-08 DIAGNOSIS — Z510 Encounter for antineoplastic radiation therapy: Secondary | ICD-10-CM | POA: Diagnosis not present

## 2015-10-09 ENCOUNTER — Ambulatory Visit: Payer: Medicare Other

## 2015-10-09 ENCOUNTER — Ambulatory Visit
Admission: RE | Admit: 2015-10-09 | Discharge: 2015-10-09 | Disposition: A | Discharge status: Home / Self Care | Payer: Medicare Other | Source: Ambulatory Visit | Attending: Radiation Oncology | Admitting: Radiation Oncology

## 2015-10-09 DIAGNOSIS — Z510 Encounter for antineoplastic radiation therapy: Secondary | ICD-10-CM | POA: Diagnosis not present

## 2015-10-10 ENCOUNTER — Ambulatory Visit
Admission: RE | Admit: 2015-10-10 | Discharge: 2015-10-10 | Disposition: A | Discharge status: Home / Self Care | Payer: Medicare Other | Source: Ambulatory Visit | Attending: Radiation Oncology | Admitting: Radiation Oncology

## 2015-10-10 ENCOUNTER — Encounter: Payer: Self-pay | Admitting: Radiation Oncology

## 2015-10-10 ENCOUNTER — Ambulatory Visit: Payer: Medicare Other

## 2015-10-10 VITALS — BP 100/79 | HR 90 | Temp 97.8°F | Resp 18 | Wt 124.9 lb

## 2015-10-10 DIAGNOSIS — C772 Secondary and unspecified malignant neoplasm of intra-abdominal lymph nodes: Secondary | ICD-10-CM

## 2015-10-10 DIAGNOSIS — Z510 Encounter for antineoplastic radiation therapy: Secondary | ICD-10-CM | POA: Diagnosis not present

## 2015-10-10 NOTE — Progress Notes (Addendum)
Weekly rad txs retroperitoneal  9/10 completed, was having hallucinations with the fentanyl patch 63mg, patient stopped taking , pain in groin area mild, taking dilaudid prn,  osmolite  Via peg, decreased to 5oml/hr due to nausea, , no diarrhea, taking senokot which keeps him regular, fatigued, 1 month appt card given BP 100/79 mmHg  Pulse 90  Temp(Src) 97.8 F (36.6 C) (Oral)  Resp 18  Wt 124 lb 14.4 oz (56.654 kg)  Wt Readings from Last 3 Encounters:  10/10/15 124 lb 14.4 oz (56.654 kg)  10/03/15 128 lb (58.06 kg)  09/24/15 127 lb 9.6 oz (57.879 kg)

## 2015-10-11 NOTE — Progress Notes (Signed)
Department of Radiation Oncology  Phone:  [phone] Fax:        [phone]  Weekly Treatment Note    Name: [name] Date: 10/11/2015 MRN: [id number] DOB: 11/04/1945   Diagnosis:     ICD-9-CM ICD-10-CM   1. 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 b complex vitamins tablet Take 1 tablet by mouth daily.    . cephALEXin (KEFLEX) 500 MG capsule Take 1 capsule (500 mg total) by mouth 4 (four) times daily. 28 capsule 0  . diphenhydramine-acetaminophen (TYLENOL PM) 25-500 MG TABS tablet Take 1 tablet by mouth at bedtime as needed (sleep.).    Marland Kitchen HYDROmorphone (DILAUDID) 2 MG tablet Take 1-2 tablets (2-4 mg total) by mouth every 4 (four) hours as needed for severe pain. Reported on 09/24/2015 90 tablet 0  . LORazepam (ATIVAN) 1 MG tablet Take 1 mg by mouth at bedtime. Reported on 09/24/2015    . midodrine (PROAMATINE) 5 MG tablet Take 5 mg by mouth 3 (three) times daily with meals.    . Multiple Vitamin (MULTIVITAMIN) tablet Take 1 tablet by mouth every morning.     . mupirocin ointment (BACTROBAN) 2 % Apply 1 application topically 3 (three) times daily. Around g tube area  0  . Nutritional Supplements (FEEDING SUPPLEMENT, OSMOLITE 1.5 CAL,) LIQD Begin Osmolite 1.5 with 2.0 at 40 cc/ hr continuosly current at 40 cc/ hr for now and gradually advance to 65 cc/ hr which is current goal via PEG tube. ( may take a week or wo to reach his goal)   Flush feeding tube with 240 cc free water 6 times daily. (Patient taking differently: 1,185 mLs by Gastric Tube route See admin instructions. Begin Osmolite 1.5 with 2.0 at 50cc/ hr continuosly current cc.  Could not tolerate 4m /hr   45 ml sterile water per hour) 1422 mL 0  . ondansetron (ZOFRAN) 8 MG tablet Take 1 tablet (8 mg total) by mouth every 8 (eight) hours as needed for  nausea or vomiting. 30 tablet 0  . prochlorperazine (COMPAZINE) 10 MG tablet Take 1 tablet (10 mg total) by mouth every 6 (six) hours as needed for nausea or vomiting. 30 tablet 1  . promethazine (PHENERGAN) 25 MG tablet Take 25 mg by mouth every 6 (six) hours as needed for nausea or vomiting.     No current facility-administered medications for this encounter.     ALLERGIES: Fentanyl; Augmentin; Carboplatin; Levaquin; Percocet; Scopolamine; and Zolpidem tartrate   LABORATORY DATA:  Lab Results  Component Value Date   WBC 21.0* 09/08/2015   HGB 13.2 09/08/2015   HCT 39.6 09/08/2015   MCV 105.6* 09/08/2015   PLT 279 09/08/2015   Lab Results  Component Value Date   NA 135* 08/19/2015   K 4.9 08/19/2015   CL 102 06/20/2015   CO2 21* 08/19/2015   Lab Results  Component Value Date   ALT 137* 08/19/2015   AST 86* 08/19/2015   ALKPHOS 529* 08/19/2015   BILITOT 3.72* 08/19/2015     NARRATIVE: RJashua Knaakwas seen today for weekly treatment management. The chart was checked and the patient's films were reviewed.  Weekly rad txs retroperitoneal  9/10 completed, was having hallucinations with the fentanyl patch 211m, patient stopped taking , pain in groin area mild, taking dilaudid prn,  osmolite  Via peg,  decreased to 5oml/hr due to nausea, , no diarrhea, taking senokot which keeps him regular, fatigued, 1 month appt card given BP 100/79 mmHg  Pulse 90  Temp(Src) 97.8 F (36.6 C) (Oral)  Resp 18  Wt 124 lb 14.4 oz (56.654 kg)  Wt Readings from Last 3 Encounters:  10/10/15 124 lb 14.4 oz (56.654 kg)  10/03/15 128 lb (58.06 kg)  09/24/15 127 lb 9.6 oz (57.879 kg)    PHYSICAL EXAMINATION: weight is 124 lb 14.4 oz (56.654 kg). His oral temperature is 97.8 F (36.6 C). His blood pressure is 100/79 and his pulse is 90. His respiration is 18.        ASSESSMENT: The patient is doing satisfactorily with treatment.  PLAN: We will continue with the patient's radiation  treatment as planned. The patient will finish his final fraction early next week.

## 2015-10-13 ENCOUNTER — Encounter: Payer: Self-pay | Admitting: Radiation Oncology

## 2015-10-13 ENCOUNTER — Telehealth: Payer: Self-pay | Admitting: *Deleted

## 2015-10-13 ENCOUNTER — Ambulatory Visit
Admission: RE | Admit: 2015-10-13 | Discharge: 2015-10-13 | Disposition: A | Discharge status: Home / Self Care | Payer: Medicare Other | Source: Ambulatory Visit | Attending: Radiation Oncology | Admitting: Radiation Oncology

## 2015-10-13 ENCOUNTER — Ambulatory Visit: Payer: Medicare Other

## 2015-10-13 VITALS — BP 108/81 | HR 90 | Temp 97.8°F | Ht 68.0 in | Wt 123.9 lb

## 2015-10-13 DIAGNOSIS — C3492 Malignant neoplasm of unspecified part of left bronchus or lung: Secondary | ICD-10-CM

## 2015-10-13 DIAGNOSIS — C772 Secondary and unspecified malignant neoplasm of intra-abdominal lymph nodes: Secondary | ICD-10-CM

## 2015-10-13 DIAGNOSIS — R112 Nausea with vomiting, unspecified: Secondary | ICD-10-CM

## 2015-10-13 DIAGNOSIS — Z510 Encounter for antineoplastic radiation therapy: Secondary | ICD-10-CM | POA: Diagnosis not present

## 2015-10-13 MED ORDER — LORAZEPAM 1 MG PO TABS: 0.5000 mg | ORAL_TABLET | ORAL | Status: DC | PRN

## 2015-10-13 NOTE — Telephone Encounter (Signed)
Returned call to wife, patient having a lot of  abfominal bloating and gas over the weekend, has tried gas ex didn't help much, he didn't want any tube feedings or water flushes, and has been nauseated,   zofran, phenergan,  Given, his appt today has been moved to 340pm, he can try ativan also , if he needs to be seen by MD i will put in for  MD that is here  As Dr. Lisbeth Renshaw is off today, wife appreciated the change in time for his last appt Will also let our PA know 8:33 AM

## 2015-10-13 NOTE — Progress Notes (Addendum)
Lou has completed treatment with 10 fractions to his retroperitoneal area.  He denies having pain today.  He reports having nausea that stated on Saturday.  He also has abdominal bloating.  He has been taking Zofran and phenergan.  He also tried ativan today which helped.  He took 2 gas x this morning without relief. He usually has continues feedings at 6m/hr and has only been able to tolerate 40 cc/hr since Saturday.  He reports having a recent infection around his peg tube.  He finished Keflex yesterday.  He reports having fatigue.  BP 108/81 mmHg  Pulse 90  Temp(Src) 97.8 F (36.6 C) (Oral)  Ht '5\' 8"'$  (1.727 m)  SpO2 100%   Wt Readings from Last 3 Encounters:  10/13/15 123 lb 14.4 oz (56.201 kg)  10/10/15 124 lb 14.4 oz (56.654 kg)  10/03/15 128 lb (58.06 kg)

## 2015-10-14 ENCOUNTER — Ambulatory Visit: Payer: Medicare Other

## 2015-10-14 ENCOUNTER — Other Ambulatory Visit: Payer: Self-pay | Admitting: *Deleted

## 2015-10-14 NOTE — Patient Outreach (Signed)
[facility]) Care Management  10/14/2015  Giorgi Debruin 07-17-1945 [id number]  Referral from [place].   Telephone call to patient who was advised of reason for follow up call.  HIPPA verification received from patient.  Patient voices that his wife called Nurse Line on 07/16 and he was able to see Physician Assistant on 07/17 where he received radiation treatment.  States adjustments are being made to his tube feedings and medications. States he is able to get to MD appointments and treatments without problems.  No problems obtaining medications. Patient has family support.   Patient advised of [facility] care management services. He advised that he had no other health concerns or case management needs at this time. Patient thanked RN CM for call.   Plan: Send to care management assistant to close case.  Sherrin Daisy, RN BSN [place] Management Coordinator [facility] Care Management  [phone]

## 2015-10-14 NOTE — Progress Notes (Signed)
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   Narrative:  The patient returns today for a work in visit. He is just completed his radiotherapy today. He is planning to come back in 1 month's time for continued evaluation. His wife is concerned that he is not eating as well, has dropped down to 1-2+ cans of enteral feedings per day, and continues to have abdominal distention, bloating and nausea with several episodes of emesis of tube feeds. The patient apparently is on 65 mL an enteral feeds between, and duration of 1.5-2.0, in the last week or so, he has been unable to tolerate the volume due to nausea, and his wife his taper this fact 50 mL. He also describes that this is very difficult to complete his enteral feeds as they are continuous and he has had multiple doctors appointments with radiation treatment, and in addition to visits with his medical oncologist. He denies any abdominal pain currently but states that when he has had episodes of nausea he is also experienced waves of pain. He reports passing gas, having loose watery bowel movements, and no fevers or chills. He denies any chest pain or shortness of breath. He has been taking Zofran twice a day as well as Compazine at bedtime. This morning when the patient's wife contacted [place], she was advised to give him Ativan which they had at home under his tongue for nausea which seems to have improved his nausea this morning. He denies any difficulty with pain that he had been previously noted when we first met him. He feels that this has significantly improved with the radiation. No other complaints or verbalized.                               Past Medical History:  Past Medical History  Diagnosis Date  . Hypertension   . Arthritis RIGHT SHOULDER  . Immature cataract BILATERAL  . Acute meniscal tear of knee RIGHT KNEE  . BPH (benign prostatic hypertrophy)   . Dizziness and giddiness 04/23/2014  . Tremor 04/23/2014    Right hand  . HOH (hard of hearing)     bilateral hearing aids  . Radiation 01/08/09-01/21/09    Mediastinum 30 Gy x 12 fractions  . Radiation 08/19/14-09/04/14    Palliative RT Porta hepatic LN 30 Gy  . Pneumonia   . Abnormality of gait 03/12/2015  . Shortness of breath dyspnea     with exertion  . Orthostatic hypotension     B/P tends to run 110/70 or less -using med to raise B/P.  Marland Kitchen ARF (acute renal failure) ([place]) 03/2015    pre-renal patient was septic  . 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 Swallowing impairment     "Unable to swallow"  . Non-small cell lung cancer ([place]) DX SEPT 2010  W/ CHEMORADIATION AT THAT TIME -- NOW  W/ METS--  CURRENTLY ON MAINTENANCE CHEMO TX  EVERY 3 DAYS    ONCOLOGIST- DR [facility]- lung cancer with metastasis.  . Sepsis ([place]) 03/2015    Past Surgical  History: Past Surgical History  Procedure Laterality Date  . Rotator cuff repair  2008    LEFT SHOULDER  . Bilateral ear drum surgery  1960'S  . Orif left ring finger  and revascularization of radial side  02-16-2007    DEGLOVING INJURY  . Knee arthroscopy  12/21/2011    Procedure: ARTHROSCOPY KNEE;  Surgeon: Tobi Bastos, MD;  Location: [facility];  Service: Orthopedics;  Laterality: Right;  WITH MEDIAL MENISECTOMY  . Cataract extraction Bilateral   . Tonsillectomy      as child  . Shoulder open rotator cuff repair Right 06/05/2014    Procedure: RIGHT ROTATOR CUFF REPAIR SHOULDER OPEN;  Surgeon: Latanya Maudlin, MD;  Location: [facility];  Service: Orthopedics;  Laterality: Right;  . Ercp N/A 08/09/2014    Procedure: ENDOSCOPIC  RETROGRADE CHOLANGIOPANCREATOGRAPHY (ERCP);  Surgeon: Carol Ada, MD;  Location: [facility];  Service: Endoscopy;  Laterality: N/A;  . Eus N/A 08/09/2014    Procedure: UPPER ENDOSCOPIC ULTRASOUND (EUS) RADIAL;  Surgeon: Carol Ada, MD;  Location: [facility];  Service: Endoscopy;  Laterality: N/A;  . Fine needle aspiration N/A 08/09/2014    Procedure: FINE NEEDLE ASPIRATION (FNA) LINEAR;  Surgeon: Carol Ada, MD;  Location: [facility];  Service: Endoscopy;  Laterality: N/A;  . Esophagogastroduodenoscopy (egd) with propofol N/A 08/09/2014    Procedure: ESOPHAGOGASTRODUODENOSCOPY (EGD) WITH PROPOFOL;  Surgeon: Carol Ada, MD;  Location: [facility];  Service: Endoscopy;  Laterality: N/A;  . Bile duct stent placement    . Gastrostomy tube placement  10/2014  . [place] cath    . Laryngoscopy  01/2015 and 04/21/15  . Ercp N/A 05/23/2015    Procedure: ENDOSCOPIC RETROGRADE CHOLANGIOPANCREATOGRAPHY (ERCP);  Surgeon: Carol Ada, MD;  Location: [facility];  Service: Endoscopy;  Laterality: N/A;  . Portacath placement  Removed 06/20/3015    as of 05-27-15 remains right chest  . Ercp N/A 05/30/2015    Procedure: ENDOSCOPIC RETROGRADE CHOLANGIOPANCREATOGRAPHY (ERCP);  Surgeon: Carol Ada, MD;  Location: [facility];  Service: Endoscopy;  Laterality: N/A;    Social History:  Social History   Social History  . Marital Status: Married    Spouse Name: N/A  . Number of Children: 3  . Years of Education: college   Occupational History  . ELECTRICIAN     Retired   Social History Main Topics  . Smoking status: Former Smoker -- 2.00 packs/day for 20 years    Types: Cigarettes    Quit date: 03/17/1981  . Smokeless tobacco: Never Used  . Alcohol Use: No  . Drug Use: No  . Sexual Activity: Not on file   Other Topics Concern  . Not on file   Social History Narrative   Lives with his wife, [name]   Patient is right handed.   Patient drinks 1-2 cups caffeine daily.    Family  History: Family History  Problem Relation Age of Onset  . Colon cancer    . Heart attack    . Cancer    . Hypertension    . Hyperlipidemia    . Congestive Heart Failure Mother   . Cancer Sister   . Cancer Maternal Grandfather   . Colon cancer Father   . Heart attack Brother     ALLERGIES:  is allergic to fentanyl; augmentin; carboplatin; levaquin; percocet; scopolamine; and zolpidem tartrate.  Meds: Current Outpatient Prescriptions  Medication Sig Dispense Refill  . b complex vitamins tablet Take 1 tablet by mouth daily.    Marland Kitchen  diphenhydramine-acetaminophen (TYLENOL PM) 25-500 MG TABS tablet Take 1 tablet by mouth at bedtime as needed (sleep.).    Marland Kitchen HYDROmorphone (DILAUDID) 2 MG tablet Take 1-2 tablets (2-4 mg total) by mouth every 4 (four) hours as needed for severe pain. Reported on 09/24/2015 90 tablet 0  . LORazepam (ATIVAN) 1 MG tablet Take 0.5-1 tablets (0.5-1 mg total) by mouth every 4 (four) hours as needed for anxiety (anxiety or nausea). Reported on 09/24/2015 120 tablet 1  . midodrine (PROAMATINE) 5 MG tablet Take 5 mg by mouth 3 (three) times daily with meals.    . Multiple Vitamin (MULTIVITAMIN) tablet Take 1 tablet by mouth every morning.     . mupirocin ointment (BACTROBAN) 2 % Apply 1 application topically 3 (three) times daily. Around g tube area  0  . Nutritional Supplements (FEEDING SUPPLEMENT, OSMOLITE 1.5 CAL,) LIQD Begin Osmolite 1.5 with 2.0 at 40 cc/ hr continuosly current at 40 cc/ hr for now and gradually advance to 65 cc/ hr which is current goal via PEG tube. ( may take a week or wo to reach his goal)   Flush feeding tube with 240 cc free water 6 times daily. (Patient taking differently: 1,185 mLs by Gastric Tube route See admin instructions. Begin Osmolite 1.5 with 2.0 at 50cc/ hr continuosly current cc.  Could not tolerate 63m /hr   45 ml sterile water per hour) 1422 mL 0  . omeprazole (PRILOSEC) 40 MG capsule Take 40 mg by mouth.    . ondansetron (ZOFRAN) 8  MG tablet Take 1 tablet (8 mg total) by mouth every 8 (eight) hours as needed for nausea or vomiting. 30 tablet 0  . promethazine (PHENERGAN) 25 MG tablet Take 25 mg by mouth every 6 (six) hours as needed for nausea or vomiting.    . cephALEXin (KEFLEX) 500 MG capsule Take 1 capsule (500 mg total) by mouth 4 (four) times daily. (Patient not taking: Reported on 10/13/2015) 28 capsule 0  . prochlorperazine (COMPAZINE) 10 MG tablet Take 1 tablet (10 mg total) by mouth every 6 (six) hours as needed for nausea or vomiting. (Patient not taking: Reported on 10/13/2015) 30 tablet 1   No current facility-administered medications for this encounter.    Physical Findings:  height is _0  (1.727 m) and weight is 123 lb 14.4 oz (56.201 kg). His oral temperature is 97.8 F (36.6 C). His blood pressure is 108/81 and his pulse is 90. His oxygen saturation is 100%. .   In general this is a cachectic and chronically ill appearing Caucasian male in no acute distress. He is alert and oriented x4 and appropriate throughout the examination. HEENT reveals that the patient is normocephalic, atraumatic. EOMs are intact. PERRLA. Skin is intact without any evidence of gross lesions, no tenting is noted. Cardiopu assessment is negative for acute distress and he exhibits normal effort.lmonary  Abdomen has active bowel sounds in all quadrants and is intact. The abdomen is soft, non tender, non distended. Lower extremities are negative for pretibial pitting edema, deep calf tenderness, cyanosis or clubbing.   Lab Findings: Lab Results  Component Value Date   WBC 21.0* 09/08/2015   HGB 13.2 09/08/2015   HCT 39.6 09/08/2015   MCV 105.6* 09/08/2015   PLT 279 09/08/2015     Radiographic Findings: Dg Op Swallowing Func-medicare/speech Path  09/16/2015  Objective Swallowing Evaluation: Type of Study: MBS-Modified Barium Swallow Study Patient Details Name: REdgel DegnanMRN: [id number] of Birth: [id number]'s Date:  09/16/2015  Time: SLP Start Time (ACUTE ONLY): 1310-SLP Stop Time (ACUTE ONLY): 1350 SLP Time Calculation (min) (ACUTE ONLY): 40 min Past Medical History: Past Medical History Diagnosis Date . Hypertension  . Arthritis RIGHT SHOULDER . Immature cataract BILATERAL . Acute meniscal tear of knee RIGHT KNEE . BPH (benign prostatic hypertrophy)  . Dizziness and giddiness 04/23/2014 . Tremor 04/23/2014   Right hand . HOH (hard of hearing)    bilateral hearing aids . Radiation 01/08/09-01/21/09   Mediastinum 30 Gy x 12 fractions . Radiation 08/19/14-09/04/14   Palliative RT Porta hepatic LN 30 Gy . Pneumonia  . Abnormality of gait 03/12/2015 . Shortness of breath dyspnea    with exertion . Orthostatic hypotension    B/P tends to run 110/70 or less -using med to raise B/P. Marland Kitchen ARF (acute renal failure) ([place]) 03/2015   pre-renal patient was septic . Gastrostomy tube dependent (HCC)    Continous feedings per Gastostomy tube"unable to swallow" . Swallowing impairment    "Unable to swallow" . Non-small cell lung cancer ([place]) DX SEPT 2010  W/ CHEMORADIATION AT THAT TIME -- NOW  W/ METS--  CURRENTLY ON MAINTENANCE CHEMO TX  EVERY 97 DAYS   ONCOLOGIST- DR [facility]- lung cancer with metastasis. . Sepsis ([place]) 03/2015 Past Surgical History: Past Surgical History Procedure Laterality Date . Rotator cuff repair  2008   LEFT SHOULDER . Bilateral ear drum surgery  1960'S . Orif left ring finger  and revascularization of radial side  02-16-2007   DEGLOVING INJURY . Knee arthroscopy  12/21/2011   Procedure: ARTHROSCOPY KNEE;  Surgeon: Tobi Bastos, MD;  Location: [facility];  Service: Orthopedics;  Laterality: Right;  WITH MEDIAL MENISECTOMY . Cataract extraction Bilateral  . Tonsillectomy     as child . Shoulder open rotator cuff repair Right 06/05/2014   Procedure: RIGHT ROTATOR CUFF REPAIR SHOULDER OPEN;  Surgeon: Latanya Maudlin, MD;  [facility]: [facility];  Service: Orthopedics;  Laterality: Right; . Ercp N/A 08/09/2014   Procedure:  ENDOSCOPIC RETROGRADE CHOLANGIOPANCREATOGRAPHY (ERCP);  Surgeon: Carol Ada, MD;  Location: [facility];  Service: Endoscopy;  Laterality: N/A; . Eus N/A 08/09/2014   Procedure: UPPER ENDOSCOPIC ULTRASOUND (EUS) RADIAL;  Surgeon: Carol Ada, MD;  [facility]: [facility];  Service: Endoscopy;  Laterality: N/A; . Fine needle aspiration N/A 08/09/2014   Procedure: FINE NEEDLE ASPIRATION (FNA) LINEAR;  Surgeon: Carol Ada, MD;  [facility]: [facility];  Service: Endoscopy;  Laterality: N/A; . Esophagogastroduodenoscopy (egd) with propofol N/A 08/09/2014   Procedure: ESOPHAGOGASTRODUODENOSCOPY (EGD) WITH PROPOFOL;  Surgeon: Carol Ada, MD;  Location: [facility];  Service: Endoscopy;  Laterality: N/A; . Bile duct stent placement   . Gastrostomy tube placement  10/2014 . [place] cath   . Laryngoscopy  01/2015 and 04/21/15 . Ercp N/A 05/23/2015   Procedure: ENDOSCOPIC RETROGRADE CHOLANGIOPANCREATOGRAPHY (ERCP);  Surgeon: Carol Ada, MD;  Location: [facility] END[facility];  Service: Endoscopy;  Laterality: N/A; . Portacath placement  Removed 06/20/3015   as of 05-27-15 remains right chest . Ercp N/A 05/30/2015   Procedure: ENDOSCOPIC RETROGRADE CHOLANGIOPANCREATOGRAPHY (ERCP);  Surgeon: Carol Ada, MD;  Location: [facility];  Service: Endoscopy;  Laterality: N/A; HPI: pt is a 70 yo with complex medical hx including metastatic lung cancer s/p radiation tx over 7 years - most recent near throat region per spouse finishing in October 2016.  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  Pt reports he has only been consuming water (approx 3 ounces daily) by mouth and relies on tube feeding for nutritional support.  He admits to occasionally coughing on liquids - sensing it "going the wrong way" and residuals in throat when he tries solid foods.  Pt has  seen a [place] SLP once but his wife stated he had a lot going on and did not continue SLP after one visit.  Pt's wife admits to his voice becoming more hoarse recently- within the last few days.  Pt admits to trying some foods this week but having to expectorate solids.   Subjective: pt alert, sitting in chair - wife present Assessment / Plan / Recommendation CHL IP CLINICAL IMPRESSIONS 09/16/2015 Therapy Diagnosis Severe pharyngeal phase dysphagia Clinical Impression Pt presents with severe pharyngeal *suspected also cervical esophageal* dysphagia with sensorimotor deficits.  Pt with severely impaired hyolaryngeal motility and tongue base retraction resulting in gross residuals across all consistencies. Pt with laryngeal penetration of liquid barium that is retained in pharynx with further swallow attempts.  No aspiration observed during MBS.  Various postures including chin tuck and head turn left did not improve clearance.  Pt did expectorate per SLP cue and removed vallecular stasis of cracker/liquid.  He did not clear cracker residual mixed with secretions in pyriform sinus despite multiple swallows - thin water.  Pt did have subtle cough after thin water swallow - presumed due to penetraiton/aspiration.  Recommend pt continue NPO except water after oral care to decrease disuse muscle atrophy.  Using live video, educated pt/spouse.  Follow up SLP for dysphagia treatment recommended to see if pt can get some functional return.  Pt and spouse agreeable to results of testing and recommendations.    Impact on safety and function Severe aspiration risk;Risk for inadequate nutrition/hydration   No flowsheet data found.  No flowsheet data found. CHL IP DIET RECOMMENDATION 09/16/2015 SLP Diet Recommendations NPO;Ice chips PRN after oral care;Other (Comment)[place] Water Protocol Liquid Administration via Cup Medication Administration Via peg Compensations Slow rate;Small sips/bites;Multiple dry swallows after each  bite/sip;Effortful swallow; expectorate after intake  Postural Changes Remain semi-upright after after feeds/meals (Comment);Seated upright at 90 degrees   CHL IP OTHER RECOMMENDATIONS 09/16/2015 Recommended Consults Other (Comment) Oral Care Recommendations Oral care BID Other Recommendations --   CHL IP FOLLOW UP RECOMMENDATIONS 09/16/2015 Follow up Recommendations (No Data)   No flowsheet data found.     CHL IP ORAL PHASE 09/16/2015 Oral Phase WFL Oral - Pudding Teaspoon -- Oral - Pudding Cup -- Oral - Honey Teaspoon -- Oral - Honey Cup -- Oral - Nectar Teaspoon -- Oral - Nectar Cup WFL Oral - Nectar Straw -- Oral - Thin Teaspoon WFL Oral - Thin Cup WFL Oral - Thin Straw -- Oral - Puree -- Oral - Mech Soft WFL Oral - Regular -- Oral - Multi-Consistency -- Oral - Pill -- Oral Phase - Comment --  CHL IP PHARYNGEAL PHASE 09/16/2015 Pharyngeal Phase Impaired Pharyngeal- Pudding Teaspoon -- Pharyngeal -- Pharyngeal- Pudding Cup -- Pharyngeal -- Pharyngeal- Honey Teaspoon -- Pharyngeal -- Pharyngeal- Honey Cup -- Pharyngeal -- Pharyngeal- Nectar Teaspoon -- Pharyngeal -- Pharyngeal- Nectar Cup Reduced pharyngeal peristalsis;Reduced epiglottic inversion;Reduced anterior laryngeal mobility;Reduced laryngeal elevation;Reduced airway/laryngeal closure;Reduced tongue base retraction;Penetration/Apiration after swallow;Pharyngeal residue - valleculae;Pharyngeal residue - pyriform;Lateral channel residue;Compensatory strategies attempted (with notebox) Pharyngeal Material enters airway, remains ABOVE vocal cords then ejected out Pharyngeal- Nectar Straw -- Pharyngeal -- Pharyngeal- Thin  Teaspoon Reduced pharyngeal peristalsis;Reduced epiglottic inversion;Reduced anterior laryngeal mobility;Reduced laryngeal elevation;Reduced airway/laryngeal closure;Reduced tongue base retraction;Penetration/Apiration after swallow;Pharyngeal residue - valleculae;Pharyngeal residue - pyriform;Compensatory strategies attempted (with notebox)  Pharyngeal Material enters airway, remains ABOVE vocal cords and not ejected out Pharyngeal- Thin Cup Reduced pharyngeal peristalsis;Reduced epiglottic inversion;Reduced anterior laryngeal mobility;Reduced laryngeal elevation;Reduced airway/laryngeal closure;Reduced tongue base retraction;Penetration/Apiration after swallow;Pharyngeal residue - valleculae;Pharyngeal residue - pyriform Pharyngeal Material enters airway, remains ABOVE vocal cords and not ejected out Pharyngeal- Thin Straw -- Pharyngeal -- Pharyngeal- Puree -- Pharyngeal -- Pharyngeal- Mechanical Soft Reduced pharyngeal peristalsis;Reduced epiglottic inversion;Reduced anterior laryngeal mobility;Reduced laryngeal elevation;Reduced airway/laryngeal closure;Reduced tongue base retraction;Pharyngeal residue - valleculae;Pharyngeal residue - pyriform Pharyngeal -- Pharyngeal- Regular -- Pharyngeal -- Pharyngeal- Multi-consistency -- Pharyngeal -- Pharyngeal- Pill -- Pharyngeal -- Pharyngeal Comment head turn left and chin tuck did not help swallow, dry swallows decreased amount of residuals but did not eliminate them, increased viscocity resulted in worsening residuals, pt fortunately able to expectorate and clear vallecular residuals  CHL IP CERVICAL ESOPHAGEAL PHASE 09/16/2015 Cervical Esophageal Phase Impaired Pudding Teaspoon -- Pudding Cup -- Honey Teaspoon -- Honey Cup -- Nectar Teaspoon -- Nectar Cup Reduced cricopharyngeal relaxation Nectar Straw -- Thin Teaspoon Reduced cricopharyngeal relaxation Thin Cup Reduced cricopharyngeal relaxation Thin Straw Reduced cricopharyngeal relaxation Puree -- Mechanical Soft Reduced cricopharyngeal relaxation Regular -- Multi-consistency -- Pill -- Cervical Esophageal Comment -- CHL IP GO 09/16/2015 Functional Assessment Tool Used mbs Functional Limitations Swallowing Swallow Current Status (D1761) CM Swallow Goal Status (Y0737) CM Swallow Discharge Status (T0626) CM Luanna Salk, MS [facility] SLP [phone] Macario Golds 09/16/2015, 3:27 PM            CLINICAL DATA:  70 year old male with lung cancer. PEG tube in place. Vocal cord paralysis post Teflon injection. Initial encounter. EXAM: MODIFIED BARIUM SWALLOW TECHNIQUE: Different consistencies of barium were administered orally to the patient by the Speech Pathologist. Imaging of the pharynx was performed in the lateral projection. FLUOROSCOPY TIME:  Radiation Exposure Index (as provided by the fluoroscopic device): 4.9 micro Gray Fluoroscopy Time:  1 minutes and 22 seconds. COMPARISON:  None. FINDINGS: Thin liquid- pooling within the vallecula. During clearing of barium within the vallecular, laryngeal penetration occurs. Nectar thick liquid- pooling within the vallecula. During clearing of barium within the vallecular, laryngeal penetration occurs. Pure with cracker- pooling within the vallecula. IMPRESSION: pooling within the vallecular with ingestion of thin liquid, nectar thick liquid and puree coated cracker. Laryngeal penetration occurs when patient attempts to clear barium contained within the vallecula. Please refer to the Speech Pathologists report for complete details and recommendations. Electronically Signed   By: Genia Del M.D.   On: 09/16/2015 14:16    Impression/Plan: 1. 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 The patient feels as though he is doing much better since he began radiotherapy, and has noticed significant improvement in his pain in the low back and radiating into the right hip and groin. He completed treatment today, and will return in approximately one month's time for continued evaluation. He is encouraged to keep [place] informed any questions that arise prior to that visit. 2. Nausea with several episodes of vomiting. The etiology of this could be secondary to his radiotherapy, and we discussed the use of Ativan off label for  anti-emetics. We discussed the side effects profile of the drug as well as other options for antiemetics including Reglan which may be of  benefit given his tube feeds, however he will further discuss this option with his medical oncologist. We will defer this determination to them. He will also continue scheduled Zofran every 8 hours, and a syringes provided to the patient's wife significant check post feeding residuals. He has an increased risk for developing small bowel obstruction due to several courses of treatment to the abdomen, however at this time likely he does not appear to have the physical exam findings to support that diagnosis. The patient is encouraged to keep [place] informed of any progressive symptoms which would require reevaluation. He states agreement and understanding.    Carola Rhine, PAC

## 2015-10-15 ENCOUNTER — Ambulatory Visit: Payer: Medicare Other

## 2015-10-16 ENCOUNTER — Encounter: Payer: Medicare Other | Admitting: Speech Pathology

## 2015-10-16 ENCOUNTER — Ambulatory Visit: Payer: Medicare Other

## 2015-10-17 ENCOUNTER — Ambulatory Visit: Payer: Medicare Other

## 2015-10-17 ENCOUNTER — Ambulatory Visit: Payer: Medicare Other | Attending: Family Medicine

## 2015-10-17 DIAGNOSIS — R1312 Dysphagia, oropharyngeal phase: Secondary | ICD-10-CM | POA: Insufficient documentation

## 2015-10-17 NOTE — Therapy (Signed)
[facility] [address] [place], [place], [address] Phone: [phone]   Fax:  [phone]  Speech Language Pathology Treatment  Patient Details  Name: [name] MRN: [id number] Date of Birth: [birth year] Referring Provider: Dr. [name]  Encounter Date: 10/17/2015      End of Session - 10/17/15 1304    Visit Number 2   Number of Visits 17   Date for SLP Re-Evaluation 11/20/15   SLP Start Time 1150   SLP Stop Time  1230   SLP Time Calculation (min) 40 min   Activity Tolerance Patient tolerated treatment well      Past Medical History  Diagnosis Date  . Hypertension   . Arthritis RIGHT SHOULDER  . Immature cataract BILATERAL  . Acute meniscal tear of knee RIGHT KNEE  . BPH (benign prostatic hypertrophy)   . Dizziness and giddiness 04/23/2014  . Tremor 04/23/2014    Right hand  . HOH (hard of hearing)     bilateral hearing aids  . Radiation 01/08/09-01/21/09    Mediastinum 30 Gy x 12 fractions  . Radiation 08/19/14-09/04/14    Palliative RT Porta hepatic LN 30 Gy  . Pneumonia   . Abnormality of gait 03/12/2015  . Shortness of breath dyspnea     with exertion  . Orthostatic hypotension     B/P tends to run 110/70 or less -using med to raise B/P.  Marland Kitchen ARF (acute renal failure) ([place]) 03/2015    pre-renal patient was septic  . 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 Swallowing impairment     "Unable to swallow"  . Non-small cell lung cancer ([place]) DX SEPT 2010  W/ CHEMORADIATION AT THAT TIME -- NOW  W/ METS--  CURRENTLY ON MAINTENANCE CHEMO TX  EVERY 27 DAYS    ONCOLOGIST- [facility]- lung cancer with metastasis.  . Sepsis ([place]) 03/2015    Past Surgical History  Procedure Laterality Date  . Rotator cuff repair  2008    LEFT SHOULDER  . Bilateral ear drum surgery  1960'S  . Orif left ring finger  and revascularization of radial side   02-16-2007    DEGLOVING INJURY  . Knee arthroscopy  12/21/2011    Procedure: ARTHROSCOPY KNEE;  Surgeon: Tobi Bastos, MD;  Location: [facility];  Service: Orthopedics;  Laterality: Right;  WITH MEDIAL MENISECTOMY  . Cataract extraction Bilateral   . Tonsillectomy      as child  . Shoulder open rotator cuff repair Right 06/05/2014    Procedure: RIGHT ROTATOR CUFF REPAIR SHOULDER OPEN;  Surgeon: Latanya Maudlin, MD;  Location: [facility];  Service: Orthopedics;  Laterality: Right;  . Ercp N/A 08/09/2014    Procedure: ENDOSCOPIC RETROGRADE CHOLANGIOPANCREATOGRAPHY (ERCP);  Surgeon: Carol Ada, MD;  Location: [facility];  Service: Endoscopy;  Laterality: N/A;  . Eus N/A 08/09/2014    Procedure: UPPER ENDOSCOPIC ULTRASOUND (EUS) RADIAL;  Surgeon: Carol Ada, MD;  Location: [facility];  Service: Endoscopy;  Laterality: N/A;  . Fine needle aspiration N/A 08/09/2014    Procedure: FINE NEEDLE ASPIRATION (FNA) LINEAR;  Surgeon: Carol Ada, MD;  Location: [facility];  Service: Endoscopy;  Laterality: N/A;  . Esophagogastroduodenoscopy (egd) with propofol N/A 08/09/2014    Procedure: ESOPHAGOGASTRODUODENOSCOPY (EGD) WITH PROPOFOL;  Surgeon: Carol Ada, MD;  Location: [facility];  Service: Endoscopy;  Laterality: N/A;  . Bile duct stent placement    . Gastrostomy tube placement  10/2014  . [place] cath    . Laryngoscopy  01/2015 and 04/21/15  . Ercp N/A 05/23/2015    Procedure: ENDOSCOPIC RETROGRADE CHOLANGIOPANCREATOGRAPHY (ERCP);  Surgeon: Carol Ada, MD;  Location: [facility] END[facility]COPY;  Service: Endoscopy;  Laterality: N/A;  . Portacath placement  Removed 06/20/3015    as of 05-27-15 remains right chest  . Ercp N/A 05/30/2015    Procedure: ENDOSCOPIC RETROGRADE CHOLANGIOPANCREATOGRAPHY (ERCP);  Surgeon: Carol Ada, MD;  Location: [facility];  Service: Endoscopy;  Laterality: N/A;    There were no vitals filed for this visit.      Subjective Assessment - 10/17/15 1156     Subjective Pt finished XRT Monday and did HEP yesterday.   Currently in Pain? No/denies               ADULT SLP TREATMENT - 10/17/15 1156    General Information   Behavior/Cognition Alert;Cooperative;Pleasant mood   Treatment Provided   Treatment provided Dysphagia   Dysphagia Treatment   Respiratory Status Room air   Treatment Methods Therapeutic exercise;Skilled observation   Patient observed directly with PO's Yes   Type of PO's observed Ice chips   Other treatment/comments Pt reported completing oral care prior to session today. SLP assisted pt with HEP, he req'd min A occasionally. SLP explained rationale for some of the exercises on the HEP. Told pt/wife that pt should expect 6-8 weeks before seeing notable improvement with swallow function suspected.   Dysphagia Recommendations   Diet recommendations NPO  water protocol   Progression Toward Goals   Progression toward goals Progressing toward goals          SLP Education - 10/17/15 1304    Education provided Yes   Education Details HEP, rationale for some exercises on HEP, suspected timeline for improvement   Person(s) Educated Patient;Spouse   Methods Explanation;Demonstration   Comprehension Verbalized understanding;Returned demonstration          SLP Short Term Goals - 10/17/15 1306    SLP SHORT TERM GOAL #1   Title Pt will perform dysphagia HEP with occasional min A   Time 4   Period Weeks   Status On-going   SLP SHORT TERM GOAL #2   Title Pt will demonstrate/verbalize precautions for free water protocol with occasional min A   Time 4   Period Weeks   Status On-going          SLP Long Term Goals - 10/17/15 1306    SLP LONG TERM GOAL #1   Title Pt will perform swallow HEP with rare min A   Time 8   Period Weeks   Status On-going   SLP LONG TERM GOAL #2   Title Pt will demonstrate clinical gains/readiness for repeat objective swallowing assessment prior to initiation of PO   Time 8   Period  Weeks   Status On-going          Plan - 10/17/15 1305    Clinical Impression Statement Pt's severe dysphagia persists. Pt req'd min A occasionaly from SLP today to complete HEP. Skilled ST to cont to maximize ability for PO intake with minimal aspiration risk   Speech Therapy Frequency 2x / week   Duration --  8 weeks   Treatment/Interventions SLP instruction and feedback;Internal/external aids;Compensatory strategies;Pharyngeal strengthening exercises;Oral motor exercises;Trials of upgraded texture/liquids;Patient/family education;Diet toleration management by SLP;Aspiration precaution training   Potential to Achieve Goals Fair   Potential Considerations Severity of impairments;Pain level;Co-morbidities      Patient will benefit  from skilled therapeutic intervention in order to improve the following deficits and impairments:   Dysphagia, oropharyngeal phase    Problem List Patient Active Problem List   Diagnosis Date Noted  . Bacteremia due to Klebsiella pneumoniae 04/29/2015  . Infection due to Enterobacter cloacae 04/29/2015  . Hypokalemia 04/29/2015  . Gram-negative bacteremia ([place]) 04/25/2015  . Septic shock ([place]) 04/23/2015  . Malignant neoplasm of lung ([place])   . Sepsis ([place])   . Acute kidney injury ([place])   . Abnormality of gait 03/12/2015  . Lung cancer ([place])   . Protein-calorie malnutrition, severe ([place]) 11/26/2014  . Hypoalbuminemia 11/18/2014  . Leukocytosis 11/18/2014  . Dysphagia 11/18/2014  . Nausea with vomiting 10/03/2014  . Central line complication 38/38/1840  . Anorexia 10/03/2014  . Weight loss 10/03/2014  . Transaminitis 10/03/2014  . Hyperphosphatemia 10/03/2014  . Encounter for antineoplastic immunotherapy 09/28/2014  . Non-small cell lung cancer ([place]) 09/21/2014  . Abdominal pain   . Fever   . 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 Biliary obstruction   . Fatigue 08/27/2014  . Abdominal pain, acute   .  Abdominal mass   . Abdominal pain, generalized 08/07/2014  . CRF (chronic renal failure) 08/07/2014  . Obstructive jaundice 08/07/2014  . Malnutrition of moderate degree ([place]) 08/07/2014  . Obstructive jaundice due to cancer 08/06/2014  . Rotator cuff tear, non-traumatic 06/05/2014  . Dizziness and giddiness 04/23/2014  . Tremor 04/23/2014  . Acute on chronic renal failure ([place]) 05/24/2013  . Pancytopenia ([place]) 05/24/2013  . Osteoarthritis of right knee 12/21/2011  . Meniscus, medial, posterior horn derangement 12/21/2011  . Dyspnea 12/04/2010  . Bronchogenic cancer of left lung ([place]) 12/11/2008  . ARTHRITIS 12/10/2008    [place] ,[place], [place]  10/17/2015, 1:06 PM  [facility] [address] [place], [place], [address] Phone: [phone]   Fax:  [phone]   Name: Karen Kinnard MRN: [id number] Date of Birth: 08-03-1945

## 2015-10-20 ENCOUNTER — Ambulatory Visit: Payer: Medicare Other

## 2015-10-21 ENCOUNTER — Encounter (HOSPITAL_COMMUNITY): Payer: Self-pay | Admitting: *Deleted

## 2015-10-21 ENCOUNTER — Emergency Department (HOSPITAL_COMMUNITY): Payer: Medicare Other

## 2015-10-21 ENCOUNTER — Ambulatory Visit: Payer: Medicare Other | Admitting: Internal Medicine

## 2015-10-21 ENCOUNTER — Emergency Department (HOSPITAL_COMMUNITY)
Admission: EM | Admit: 2015-10-21 | Discharge: 2015-10-21 | Disposition: A | Discharge status: Home / Self Care | Payer: Medicare Other | Attending: Emergency Medicine | Admitting: Emergency Medicine

## 2015-10-21 ENCOUNTER — Other Ambulatory Visit: Payer: Medicare Other

## 2015-10-21 DIAGNOSIS — E860 Dehydration: Secondary | ICD-10-CM | POA: Diagnosis not present

## 2015-10-21 DIAGNOSIS — Z87891 Personal history of nicotine dependence: Secondary | ICD-10-CM | POA: Insufficient documentation

## 2015-10-21 DIAGNOSIS — I10 Essential (primary) hypertension: Secondary | ICD-10-CM | POA: Diagnosis not present

## 2015-10-21 DIAGNOSIS — W888XXA Exposure to other ionizing radiation, initial encounter: Secondary | ICD-10-CM

## 2015-10-21 DIAGNOSIS — R531 Weakness: Secondary | ICD-10-CM | POA: Diagnosis present

## 2015-10-21 DIAGNOSIS — M069 Rheumatoid arthritis, unspecified: Secondary | ICD-10-CM | POA: Diagnosis not present

## 2015-10-21 LAB — HEPATIC FUNCTION PANEL
ALT: 67 U/L — AB (ref 17–63)
AST: 45 U/L — AB (ref 15–41)
Albumin: 2.8 g/dL — ABNORMAL LOW (ref 3.5–5.0)
Alkaline Phosphatase: 264 U/L — ABNORMAL HIGH (ref 38–126)
BILIRUBIN DIRECT: 1.4 mg/dL — AB (ref 0.1–0.5)
BILIRUBIN INDIRECT: 1 mg/dL — AB (ref 0.3–0.9)
TOTAL PROTEIN: 6.5 g/dL (ref 6.5–8.1)
Total Bilirubin: 2.4 mg/dL — ABNORMAL HIGH (ref 0.3–1.2)

## 2015-10-21 LAB — BASIC METABOLIC PANEL
ANION GAP: 8 (ref 5–15)
BUN: 33 mg/dL — ABNORMAL HIGH (ref 6–20)
CALCIUM: 9 mg/dL (ref 8.9–10.3)
CO2: 21 mmol/L — ABNORMAL LOW (ref 22–32)
CREATININE: 1.06 mg/dL (ref 0.61–1.24)
Chloride: 107 mmol/L (ref 101–111)
Glucose, Bld: 138 mg/dL — ABNORMAL HIGH (ref 65–99)
Potassium: 4.1 mmol/L (ref 3.5–5.1)
SODIUM: 136 mmol/L (ref 135–145)

## 2015-10-21 LAB — CBC WITH DIFFERENTIAL/PLATELET
BASOS PCT: 0 %
Basophils Absolute: 0 10*3/uL (ref 0.0–0.1)
EOS ABS: 0.3 10*3/uL (ref 0.0–0.7)
EOS PCT: 4 %
HCT: 30.1 % — ABNORMAL LOW (ref 39.0–52.0)
HEMOGLOBIN: 10.3 g/dL — AB (ref 13.0–17.0)
Lymphocytes Relative: 6 %
Lymphs Abs: 0.4 10*3/uL — ABNORMAL LOW (ref 0.7–4.0)
MCH: 36.1 pg — AB (ref 26.0–34.0)
MCHC: 34.2 g/dL (ref 30.0–36.0)
MCV: 105.6 fL — ABNORMAL HIGH (ref 78.0–100.0)
Monocytes Absolute: 1 10*3/uL (ref 0.1–1.0)
Monocytes Relative: 13 %
NEUTROS PCT: 76 %
Neutro Abs: 5.9 10*3/uL (ref 1.7–7.7)
PLATELETS: 110 10*3/uL — AB (ref 150–400)
RBC: 2.85 MIL/uL — AB (ref 4.22–5.81)
RDW: 13.9 % (ref 11.5–15.5)
WBC: 7.7 10*3/uL (ref 4.0–10.5)

## 2015-10-21 LAB — URINE MICROSCOPIC-ADD ON
RBC / HPF: NONE SEEN RBC/hpf (ref 0–5)
WBC UA: NONE SEEN WBC/hpf (ref 0–5)

## 2015-10-21 LAB — CBG MONITORING, ED: Glucose-Capillary: 133 mg/dL — ABNORMAL HIGH (ref 65–99)

## 2015-10-21 LAB — URINALYSIS, ROUTINE W REFLEX MICROSCOPIC
BILIRUBIN URINE: NEGATIVE
Glucose, UA: NEGATIVE mg/dL
Hgb urine dipstick: NEGATIVE
KETONES UR: NEGATIVE mg/dL
LEUKOCYTES UA: NEGATIVE
NITRITE: NEGATIVE
Specific Gravity, Urine: <1.005 — ABNORMAL LOW (ref 1.005–1.030)
pH: 5.5 (ref 5.0–8.0)

## 2015-10-21 LAB — LACTIC ACID, PLASMA: LACTIC ACID, VENOUS: 1.5 mmol/L (ref 0.5–1.9)

## 2015-10-21 MED ORDER — ONDANSETRON HCL 4 MG/2ML IJ SOLN
4.0000 mg | Freq: Once | INTRAMUSCULAR | Status: AC
  Administered 2015-10-21: 4 mg via INTRAVENOUS
  Filled 2015-10-21: qty 2

## 2015-10-21 MED ORDER — RANOLAZINE ER 500 MG PO TB12
500.0000 mg | ORAL_TABLET | Freq: Once | ORAL | Status: AC
  Administered 2015-10-21: 500 mg via ORAL
  Filled 2015-10-21: qty 1

## 2015-10-21 MED ORDER — SODIUM CHLORIDE 0.9 % IV BOLUS (SEPSIS)
2000.0000 mL | Freq: Once | INTRAVENOUS | Status: AC
Start: 2015-10-21 — End: 2015-10-21
  Administered 2015-10-21: 2000 mL via INTRAVENOUS

## 2015-10-21 MED ORDER — SODIUM CHLORIDE 0.9 % IV BOLUS (SEPSIS)
1000.0000 mL | Freq: Once | INTRAVENOUS | Status: AC
  Administered 2015-10-21: 1000 mL via INTRAVENOUS

## 2015-10-21 MED ORDER — DICYCLOMINE HCL 20 MG PO TABS: ORAL_TABLET | ORAL | 0 refills | Status: DC

## 2015-10-21 MED ORDER — PROMETHAZINE HCL 25 MG/ML IJ SOLN
12.5000 mg | Freq: Once | INTRAMUSCULAR | Status: AC
  Administered 2015-10-21: 12.5 mg via INTRAVENOUS
  Filled 2015-10-21: qty 1

## 2015-10-21 NOTE — Discharge Instructions (Signed)
Follow up in 2-3 days with your md if not improving.

## 2015-10-21 NOTE — ED Notes (Signed)
Attempted two IV sticks on left forearm and left AC.  Unsuccessful.  2nd nurse attempting IV.

## 2015-10-21 NOTE — ED Notes (Signed)
Patient transported to x-ray by Faith Little. RN notified.

## 2015-10-21 NOTE — ED Notes (Signed)
MD at bedside. 

## 2015-10-21 NOTE — ED Notes (Signed)
Lab at bedside

## 2015-10-21 NOTE — ED Provider Notes (Signed)
[facility] DEPT Provider Note   CSN: [id number] Arrival date & time: 10/21/15  4360  By signing my name below, I, Emmanuella Mensah, attest that this documentation has been prepared under the direction and in the presence of Milton Ferguson, MD. Electronically Signed: Judithann Sauger, ED Scribe. 10/21/15. 9:33 AM.   History   Chief Complaint Chief Complaint  Patient presents with  . Weakness   HPI Comments: [name] is a 70 y.o. male with a hx of non-small cell lung cancer, hypertension, and acute renal failure who presents to the Emergency Department complaining of ongoing generalized weakness onset 3 days ago, worse yesterday. No alleviating factors noted. He has tried Aleve and Zofran PTA with no relief of his weakness. He states that his Oncologist is Dr. [name] and he finished his radiation one week ago after 10 treatment sessions. He denies any fever, n/v, or pain. No other complaints at this time.    Patient complains of some nausea and abdominal cramping patient has had radiation treatment for his lung cancer and he was told he may have some nausea problems   The history is provided by the patient. No language interpreter was used.  Weakness  This is a new problem. The current episode started more than 2 days ago. The problem occurs daily. The problem has not changed since onset.Pertinent negatives include no chest pain, no abdominal pain and no headaches. Nothing aggravates the symptoms. Nothing relieves the symptoms. He has tried acetaminophen for the symptoms. The treatment provided mild relief.    Past Medical History:  Diagnosis Date  . Abnormality of gait 03/12/2015  . Acute meniscal tear of knee RIGHT KNEE  . ARF (acute renal failure) ([place]) 03/2015   pre-renal patient was septic  . Arthritis RIGHT SHOULDER  . BPH (benign prostatic hypertrophy)   . Dizziness and giddiness 04/23/2014  . Gastrostomy tube dependent (HCC)    Continous feedings per Gastostomy  tube"unable to swallow"  . HOH (hard of hearing)    bilateral hearing aids  . Hypertension   . Immature cataract BILATERAL  . Non-small cell lung cancer ([place]) DX SEPT 2010  W/ CHEMORADIATION AT THAT TIME -- NOW  W/ METS--  CURRENTLY ON MAINTENANCE CHEMO TX  EVERY 28 DAYS   ONCOLOGIST- DR [facility]- lung cancer with metastasis.  . Orthostatic hypotension    B/P tends to run 110/70 or less -using med to raise B/P.  Marland Kitchen Pneumonia   . Radiation 01/08/09-01/21/09   Mediastinum 30 Gy x 12 fractions  . Radiation 08/19/14-09/04/14   Palliative RT Porta hepatic LN 30 Gy  . Sepsis ([place]) 03/2015  . Shortness of breath dyspnea    with exertion  . Swallowing impairment    "Unable to swallow"  . Tremor 04/23/2014   Right hand    Patient Active Problem List   Diagnosis Date Noted  . Bacteremia due to Klebsiella pneumoniae 04/29/2015  . Infection due to Enterobacter cloacae 04/29/2015  . Hypokalemia 04/29/2015  . Gram-negative bacteremia ([place]) 04/25/2015  . Septic shock ([place]) 04/23/2015  . Malignant neoplasm of lung ([place])   . Sepsis ([place])   . Acute kidney injury ([place])   . Abnormality of gait 03/12/2015  . Lung cancer ([place])   . Protein-calorie malnutrition, severe ([place]) 11/26/2014  . Hypoalbuminemia 11/18/2014  . Leukocytosis 11/18/2014  . Dysphagia 11/18/2014  . Nausea with vomiting 10/03/2014  . Central line complication 67/70/3403  . Anorexia 10/03/2014  . Weight loss 10/03/2014  . Transaminitis 10/03/2014  . Hyperphosphatemia  10/03/2014  . Encounter for antineoplastic immunotherapy 09/28/2014  . Non-small cell lung cancer ([place]) 09/21/2014  . Abdominal pain   . Fever   . 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 Biliary obstruction   . Fatigue 08/27/2014  . Abdominal pain, acute   . Abdominal mass   . Abdominal pain, generalized 08/07/2014  . CRF (chronic renal failure) 08/07/2014  . Obstructive jaundice 08/07/2014  . Malnutrition of moderate degree ([place])  08/07/2014  . Obstructive jaundice due to cancer 08/06/2014  . Rotator cuff tear, non-traumatic 06/05/2014  . Dizziness and giddiness 04/23/2014  . Tremor 04/23/2014  . Acute on chronic renal failure ([place]) 05/24/2013  . Pancytopenia ([place]) 05/24/2013  . Osteoarthritis of right knee 12/21/2011  . Meniscus, medial, posterior horn derangement 12/21/2011  . Dyspnea 12/04/2010  . Bronchogenic cancer of left lung ([place]) 12/11/2008  . ARTHRITIS 12/10/2008    Past Surgical History:  Procedure Laterality Date  . BILATERAL EAR DRUM SURGERY  1960'S  . BILE DUCT STENT PLACEMENT    . CATARACT EXTRACTION Bilateral   . ERCP N/A 08/09/2014   Procedure: ENDOSCOPIC RETROGRADE CHOLANGIOPANCREATOGRAPHY (ERCP);  Surgeon: Carol Ada, MD;  Location: [facility];  Service: Endoscopy;  Laterality: N/A;  . ERCP N/A 05/23/2015   Procedure: ENDOSCOPIC RETROGRADE CHOLANGIOPANCREATOGRAPHY (ERCP);  Surgeon: Carol Ada, MD;  Location: [facility] ENDOSCOPY;  Service: Endoscopy;  Laterality: N/A;  . ERCP N/A 05/30/2015   Procedure: ENDOSCOPIC RETROGRADE CHOLANGIOPANCREATOGRAPHY (ERCP);  Surgeon: Carol Ada, MD;  Location: [facility];  Service: Endoscopy;  Laterality: N/A;  . ESOPHAGOGASTRODUODENOSCOPY (EGD) WITH PROPOFOL N/A 08/09/2014   Procedure: ESOPHAGOGASTRODUODENOSCOPY (EGD) WITH PROPOFOL;  Surgeon: Carol Ada, MD;  [facility];  Service: Endoscopy;  Laterality: N/A;  . EUS N/A 08/09/2014   Procedure: UPPER ENDOSCOPIC ULTRASOUND (EUS) RADIAL;  Surgeon: Carol Ada, MD;  Location: [facility];  Service: Endoscopy;  Laterality: N/A;  . FINE NEEDLE ASPIRATION N/A 08/09/2014   Procedure: FINE NEEDLE ASPIRATION (FNA) LINEAR;  Surgeon: Carol Ada, MD;  Location[facility];  Service: Endoscopy;  Laterality: N/A;  . GASTROSTOMY TUBE PLACEMENT  10/2014  . KNEE ARTHROSCOPY  12/21/2011   Procedure: ARTHROSCOPY KNEE;  Surgeon: Tobi Bastos, MD;  Location[facility];  Service: Orthopedics;   Laterality: Right;  WITH MEDIAL MENISECTOMY  . LARYNGOSCOPY  01/2015 and 04/21/15  . ORIF LEFT RING FINGER  AND REVASCULARIZATION OF RADIAL SIDE  02-16-2007   DEGLOVING INJURY  . [place] cath    . PORTACATH PLACEMENT  Removed 06/20/3015   as of 05-27-15 remains right chest  . ROTATOR CUFF REPAIR  2008   LEFT SHOULDER  . SHOULDER OPEN ROTATOR CUFF REPAIR Right 06/05/2014   Procedure: RIGHT ROTATOR CUFF REPAIR SHOULDER OPEN;  Surgeon: Latanya Maudlin, MD;  Location: [facility];  Service: Orthopedics;  Laterality: Right;  . TONSILLECTOMY     as child       Home Medications    Prior to Admission medications   Medication Sig Start Date End Date Taking? Authorizing Provider  b complex vitamins tablet Take 1 tablet by mouth daily.    Historical Provider, MD  cephALEXin (KEFLEX) 500 MG capsule Take 1 capsule (500 mg total) by mouth 4 (four) times daily. Patient not taking: Reported on 10/13/2015 10/03/15   Kyung Rudd, MD  diphenhydramine-acetaminophen (TYLENOL PM) 25-500 MG TABS tablet Take 1 tablet by mouth at bedtime as needed (sleep.).    Historical Provider, MD  HYDROmorphone (DILAUDID) 2 MG tablet Take 1-2 tablets (  2-4 mg total) by mouth every 4 (four) hours as needed for severe pain. Reported on 09/24/2015 09/24/15   Hayden Pedro, PA-C  LORazepam (ATIVAN) 1 MG tablet Take 0.5-1 tablets (0.5-1 mg total) by mouth every 4 (four) hours as needed for anxiety (anxiety or nausea). Reported on 09/24/2015 10/13/15   Hayden Pedro, PA-C  midodrine (PROAMATINE) 5 MG tablet Take 5 mg by mouth 3 (three) times daily with meals.    Historical Provider, MD  Multiple Vitamin (MULTIVITAMIN) tablet Take 1 tablet by mouth every morning.     Historical Provider, MD  mupirocin ointment (BACTROBAN) 2 % Apply 1 application topically 3 (three) times daily. Around g tube area 05/02/15   Historical Provider, MD  Nutritional Supplements (FEEDING SUPPLEMENT, OSMOLITE 1.5 CAL,) LIQD Begin Osmolite 1.5 with 2.0 at 40 cc/  hr continuosly current at 40 cc/ hr for now and gradually advance to 65 cc/ hr which is current goal via PEG tube. ( may take a week or wo to reach his goal)   Flush feeding tube with 240 cc free water 6 times daily. Patient taking differently: 1,185 mLs by Gastric Tube route See admin instructions. Begin Osmolite 1.5 with 2.0 at 50cc/ hr continuosly current cc.  Could not tolerate 47m /hr   45 ml sterile water per hour 04/29/15   Nishant Dhungel, MD  omeprazole (PRILOSEC) 40 MG capsule Take 40 mg by mouth.    Historical Provider, MD  ondansetron (ZOFRAN) 8 MG tablet Take 1 tablet (8 mg total) by mouth every 8 (eight) hours as needed for nausea or vomiting. 05/14/15   MCurt Bears MD  prochlorperazine (COMPAZINE) 10 MG tablet Take 1 tablet (10 mg total) by mouth every 6 (six) hours as needed for nausea or vomiting. Patient not taking: Reported on 10/13/2015 10/15/14   MCurt Bears MD  promethazine (PHENERGAN) 25 MG tablet Take 25 mg by mouth every 6 (six) hours as needed for nausea or vomiting.    Historical Provider, MD    Family History Family History  Problem Relation Age of Onset  . Congestive Heart Failure Mother   . Cancer Sister   . Cancer Maternal Grandfather   . Colon cancer Father   . Heart attack Brother   . Colon cancer    . Heart attack    . Cancer    . Hypertension    . Hyperlipidemia      Social History Social History  Substance Use Topics  . Smoking status: Former Smoker    Packs/day: 2.00    Years: 20.00    Types: Cigarettes    Quit date: 03/17/1981  . Smokeless tobacco: Never Used  . Alcohol use No     Allergies   Fentanyl; Augmentin [amoxicillin-pot clavulanate]; Carboplatin; Levaquin [levofloxacin]; Percocet [oxycodone-acetaminophen]; Scopolamine; and Zolpidem tartrate   Review of Systems Review of Systems  Constitutional: Negative for appetite change and fatigue.  HENT: Negative for congestion, ear discharge and sinus pressure.   Eyes:  Negative for discharge.  Respiratory: Negative for cough.   Cardiovascular: Negative for chest pain.  Gastrointestinal: Negative for abdominal pain, diarrhea, nausea and vomiting.  Genitourinary: Negative for frequency and hematuria.  Musculoskeletal: Negative for back pain.  Skin: Negative for rash.  Neurological: Positive for weakness. Negative for seizures and headaches.  Psychiatric/Behavioral: Negative for hallucinations.     Physical Exam Updated Vital Signs BP 104/70 (BP Location: Right Arm)   Pulse 83   Temp 98.1 F (36.7 C) (Axillary)   Resp  16   Ht '5\' 8"'$  (1.727 m)   Wt 123 lb (55.8 kg)   SpO2 100%   BMI 18.70 kg/m   Physical Exam  Constitutional: He is oriented to person, place, and time. He appears cachectic.  HENT:  Head: Normocephalic.  Dry mucous membrane  Eyes: Conjunctivae and EOM are normal. No scleral icterus.  Neck: Neck supple. No thyromegaly present.  Cardiovascular: Normal rate and regular rhythm.  Exam reveals no gallop and no friction rub.   No murmur heard. Pulmonary/Chest: No stridor. He has no wheezes. He has no rales. He exhibits no tenderness.  Abdominal: He exhibits no distension. There is no tenderness. There is no rebound.  Musculoskeletal: Normal range of motion. He exhibits no edema.  Lymphadenopathy:    He has no cervical adenopathy.  Neurological: He is oriented to person, place, and time. He exhibits normal muscle tone. Coordination normal.  Skin: No rash noted. No erythema.  Psychiatric: He has a normal mood and affect. His behavior is normal.     ED Treatments / Results  DIAGNOSTIC STUDIES: Oxygen Saturation is 100% on RA, normal by my interpretation.    COORDINATION OF CARE: 9:30 AM- Pt advised of plan for treatment and pt agrees. Pt will receive lab work for further evaluation.    Labs (all labs ordered are listed, but only abnormal results are displayed) Labs Reviewed  BASIC METABOLIC PANEL  URINALYSIS, ROUTINE W REFLEX  MICROSCOPIC (NOT AT [facility])  CBC WITH DIFFERENTIAL/PLATELET  CBG MONITORING, ED    EKG  EKG Interpretation None       Radiology No results found.  Procedures Procedures (including critical care time)  Medications Ordered in ED Medications - No data to display   Initial Impression / Assessment and Plan / ED Course  Milton Ferguson, MD has reviewed the triage vital signs and the nursing notes.  Pertinent labs & imaging results that were available during my care of the patient were reviewed by me and considered in my medical decision making (see chart for details).  Clinical Course    Patient improved with IV fluids.  Final Clinical Impressions(s) / ED Diagnoses   Final diagnoses:  None   Patient with dehydration. Nausea improved with Phenergan and Zofran. Caregiver will increase the Zofran and Phenergan. And will put him back in his normal tube feedings.. Patient given some Bentyl for abdominal cramping. And he will follow-up as needed with his doctor.   The chart was scribed for me under my direct supervision.  I personally performed the history, physical, and medical decision making and all procedures in the evaluation of this patient..  New Prescriptions New Prescriptions   No medications on file     Milton Ferguson, MD 10/21/15 1444

## 2015-10-21 NOTE — ED Triage Notes (Signed)
Pt comes in by EMS for weakness starting 1 week ago. Pt has LLL cancer and lymphnode cancer. Pt got chemo last week. Since then he has had decrease strength. Pt is alert and oriented.

## 2015-10-22 ENCOUNTER — Telehealth: Payer: Self-pay | Admitting: *Deleted

## 2015-10-22 NOTE — Telephone Encounter (Signed)
Discovered this AM that Mr Guggenheim had an ED encounter on 10/21/15 related to nausea and vomiting on  which was noted as a result of dehydration. His spouse relayed that he was much better since receving  3 liters of IV fluid Hydration. Requested if he had a status change to please call our office and she stated she would.

## 2015-10-23 ENCOUNTER — Ambulatory Visit: Payer: Medicare Other | Admitting: Speech Pathology

## 2015-10-23 DIAGNOSIS — R1312 Dysphagia, oropharyngeal phase: Secondary | ICD-10-CM

## 2015-10-23 NOTE — Therapy (Signed)
[facility] [address] [place], [place], [address] Phone: [phone]   Fax:  [phone]  Speech Language Pathology Treatment  Patient Details  Name: [name] MRN: [id number] Date of Birth: 07/05/1945 Referring Provider: Dr. [name]  Encounter Date: 10/23/2015      End of Session - 10/23/15 1155    Visit Number 3   Number of Visits 17   Date for SLP Re-Evaluation 11/20/15   SLP Start Time 1103   SLP Stop Time  4401   SLP Time Calculation (min) 41 min      Past Medical History:  Diagnosis Date  . Abnormality of gait 03/12/2015  . Acute meniscal tear of knee RIGHT KNEE  . ARF (acute renal failure) ([place]) 03/2015   pre-renal patient was septic  . Arthritis RIGHT SHOULDER  . BPH (benign prostatic hypertrophy)   . Dizziness and giddiness 04/23/2014  . 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 HOH (hard of hearing)    bilateral hearing aids  . Hypertension   . Immature cataract BILATERAL  . Non-small cell lung cancer ([place]) DX SEPT 2010  W/ CHEMORADIATION AT THAT TIME -- NOW  W/ METS--  CURRENTLY ON MAINTENANCE CHEMO TX  EVERY 63 DAYS   ONCOLOGIST- DR [facility]- lung cancer with metastasis.  . Orthostatic hypotension    B/P tends to run 110/70 or less -using med to raise B/P.  Marland Kitchen Pneumonia   . Radiation 01/08/09-01/21/09   Mediastinum 30 Gy x 12 fractions  . Radiation 08/19/14-09/04/14   Palliative RT Porta hepatic LN 30 Gy  . Sepsis ([place]) 03/2015  . Shortness of breath dyspnea    with exertion  . Swallowing impairment    "Unable to swallow"  . Tremor 04/23/2014   Right hand    Past Surgical History:  Procedure Laterality Date  . BILATERAL EAR DRUM SURGERY  1960'S  . BILE DUCT STENT PLACEMENT    . CATARACT EXTRACTION Bilateral   . ERCP N/A 08/09/2014   Procedure: ENDOSCOPIC RETROGRADE CHOLANGIOPANCREATOGRAPHY (ERCP);  Surgeon: Carol Ada,  MD;  Location: [facility] ENDOSCOPY;  Service: Endoscopy;  Laterality: N/A;  . ERCP N/A 05/23/2015   Procedure: ENDOSCOPIC RETROGRADE CHOLANGIOPANCREATOGRAPHY (ERCP);  Surgeon: Carol Ada, MD;  Location: [facility] ENDOSCOPY;  Service: Endoscopy;  Laterality: N/A;  . ERCP N/A 05/30/2015   Procedure: ENDOSCOPIC RETROGRADE CHOLANGIOPANCREATOGRAPHY (ERCP);  Surgeon: Carol Ada, MD;  Location: [facility] ENDOSCOPY;  Service: Endoscopy;  Laterality: N/A;  . ESOPHAGOGASTRODUODENOSCOPY (EGD) WITH PROPOFOL N/A 08/09/2014   Procedure: ESOPHAGOGASTRODUODENOSCOPY (EGD) WITH PROPOFOL;  Surgeon: Carol Ada, MD;  Location: [facility] ENDOSCOPY;  Service: Endoscopy;  Laterality: N/A;  . EUS N/A 08/09/2014   Procedure: UPPER ENDOSCOPIC ULTRASOUND (EUS) RADIAL;  Surgeon: Carol Ada, MD;  Location: [facility] ENDOSCOPY;  Service: Endoscopy;  Laterality: N/A;  . FINE NEEDLE ASPIRATION N/A 08/09/2014   Procedure: FINE NEEDLE ASPIRATION (FNA) LINEAR;  Surgeon: Carol Ada, MD;  Location: [facility] ENDOSCOPY;  Service: Endoscopy;  Laterality: N/A;  . GASTROSTOMY TUBE PLACEMENT  10/2014  . KNEE ARTHROSCOPY  12/21/2011   Procedure: ARTHROSCOPY KNEE;  Surgeon: Tobi Bastos, MD;  Location: [facility];  Service: Orthopedics;  Laterality: Right;  WITH MEDIAL MENISECTOMY  . LARYNGOSCOPY  01/2015 and 04/21/15  . ORIF LEFT RING FINGER  AND REVASCULARIZATION OF RADIAL SIDE  02-16-2007   DEGLOVING INJURY  . [place] cath    . PORTACATH PLACEMENT  Removed 06/20/3015   as of  05-27-15 remains right chest  . ROTATOR CUFF REPAIR  2008   LEFT SHOULDER  . SHOULDER OPEN ROTATOR CUFF REPAIR Right 06/05/2014   Procedure: RIGHT ROTATOR CUFF REPAIR SHOULDER OPEN;  Surgeon: Latanya Maudlin, MD;  Location: [facility] ORS;  Service: Orthopedics;  Laterality: Right;  . TONSILLECTOMY     as child    There were no vitals filed for this visit.      Subjective Assessment - 10/23/15 1109    Subjective Pt was in ED due to dehydration   Patient is accompained by: Family member    Special Tests spouse   Currently in Pain? No/denies               ADULT SLP TREATMENT - 10/23/15 1113      General Information   Behavior/Cognition Alert;Cooperative;Pleasant mood     Treatment Provided   Treatment provided Dysphagia     Dysphagia Treatment   Respiratory Status Room air   Treatment Methods Therapeutic exercise;Skilled observation   Patient observed directly with PO's Yes   Type of PO's observed Thin liquids  small sips of water intermittently during HEP   Other treatment/comments Oral care completed prior to arrival at therapy. HEP completed with rare min A - I added vocal cord breath hold and pitch glides to HEP - pt completed new exercises with occasional to rare min A. Pt and spouse re-educated pt and spouse that pt will need repeat objective swallow study likely 6 weeks prior to initiation of PO.      Pain Assessment   Pain Assessment No/denies pain     Assessment / Recommendations / Plan   Plan Continue with current plan of care     Dysphagia Recommendations   Diet recommendations NPO     Progression Toward Goals   Progression toward goals Progressing toward goals          SLP Education - 10/23/15 1152    Education provided Yes   Education Details HEP, free water protocol reviewed   Person(s) Educated Patient;Spouse   Methods Explanation;Demonstration   Comprehension Verbalized understanding;Returned demonstration          SLP Short Term Goals - 10/23/15 1154      SLP SHORT TERM GOAL #1   Title Pt will perform dysphagia HEP with occasional min A   Time 3   Period Weeks   Status Achieved     SLP SHORT TERM GOAL #2   Title Pt will demonstrate/verbalize precautions for free water protocol with occasional min A   Time 3   Period Weeks   Status On-going          SLP Long Term Goals - 10/23/15 1155      SLP LONG TERM GOAL #1   Title Pt will perform swallow HEP with rare min A   Time 7   Period Weeks   Status On-going      SLP LONG TERM GOAL #2   Title Pt will demonstrate clinical gains/readiness for repeat objective swallowing assessment prior to initiation of PO   Time 7   Period Weeks   Status On-going          Plan - 10/23/15 1153    Clinical Impression Statement Pt's severe dysphagia persists. Pt req'd min A rarely from SLP today to complete HEP. Skilled ST to cont to maximize ability for PO intake with minimal aspiration risk. As pt becomes mod I with HEP, consider decreasing to 1x a week   Speech  Therapy Frequency 1x /week   Duration --  7 weeks   Treatment/Interventions SLP instruction and feedback;Internal/external aids;Compensatory strategies;Pharyngeal strengthening exercises;Oral motor exercises;Trials of upgraded texture/liquids;Patient/family education;Diet toleration management by SLP;Aspiration precaution training   Potential to Achieve Goals Fair   Potential Considerations Severity of impairments;Pain level;Co-morbidities   Consulted and Agree with Plan of Care Patient;Family member/caregiver   Family Member Consulted spouse      Patient will benefit from skilled therapeutic intervention in order to improve the following deficits and impairments:   Dysphagia, oropharyngeal phase    Problem List Patient Active Problem List   Diagnosis Date Noted  . Bacteremia due to Klebsiella pneumoniae 04/29/2015  . Infection due to Enterobacter cloacae 04/29/2015  . Hypokalemia 04/29/2015  . Gram-negative bacteremia ([place]) 04/25/2015  . Septic shock ([place]) 04/23/2015  . Malignant neoplasm of lung ([place])   . Sepsis ([place])   . Acute kidney injury ([place])   . Abnormality of gait 03/12/2015  . Lung cancer ([place])   . Protein-calorie malnutrition, severe ([place]) 11/26/2014  . Hypoalbuminemia 11/18/2014  . Leukocytosis 11/18/2014  . Dysphagia 11/18/2014  . Nausea with vomiting 10/03/2014  . Central line complication 47/11/2955  . Anorexia 10/03/2014  . Weight loss 10/03/2014  . Transaminitis  10/03/2014  . Hyperphosphatemia 10/03/2014  . Encounter for antineoplastic immunotherapy 09/28/2014  . Non-small cell lung cancer ([place]) 09/21/2014  . Abdominal pain   . Fever   . 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 Biliary obstruction   . Fatigue 08/27/2014  . Abdominal pain, acute   . Abdominal mass   . Abdominal pain, generalized 08/07/2014  . CRF (chronic renal failure) 08/07/2014  . Obstructive jaundice 08/07/2014  . Malnutrition of moderate degree ([place]) 08/07/2014  . Obstructive jaundice due to cancer 08/06/2014  . Rotator cuff tear, non-traumatic 06/05/2014  . Dizziness and giddiness 04/23/2014  . Tremor 04/23/2014  . Acute on chronic renal failure ([place]) 05/24/2013  . Pancytopenia ([place]) 05/24/2013  . Osteoarthritis of right knee 12/21/2011  . Meniscus, medial, posterior horn derangement 12/21/2011  . Dyspnea 12/04/2010  . Bronchogenic cancer of left lung ([place]) 12/11/2008  . ARTHRITIS 12/10/2008    Lovvorn, Annye Rusk MS, CCC-SLP 10/23/2015, 11:56 AM  [facility] [address] [place], [place], [address] Phone: [phone]   Fax:  [phone]   Name: Shyquan Stallbaumer MRN: [id number] Date of Birth: July 16, 1945

## 2015-10-24 ENCOUNTER — Telehealth: Payer: Self-pay

## 2015-10-24 NOTE — Telephone Encounter (Signed)
Patients wife called, worried about patient being weak and dehydrated and just got out of the ED. Informed her to make sure patient is staying hydrated with his free fluid. And drinking what he can. Mrs. Dykema also concerned about patients HGB informed her it was 91 and we dont typically transfuse til it is below 8 and symptomatic. Informed her to try to hydrate the patient and see how he feels over the weekend. Updated her with new appointments on 8/14. Mrs. Grieger verbalized understanding and understands to call on the call line if needed over the weekend for further direction.

## 2015-10-26 ENCOUNTER — Encounter (HOSPITAL_COMMUNITY): Payer: Self-pay | Admitting: Emergency Medicine

## 2015-10-26 ENCOUNTER — Emergency Department (HOSPITAL_COMMUNITY)
Admission: EM | Admit: 2015-10-26 | Discharge: 2015-10-26 | Disposition: A | Discharge status: Home / Self Care | Payer: Medicare Other | Attending: Emergency Medicine | Admitting: Emergency Medicine

## 2015-10-26 DIAGNOSIS — Z931 Gastrostomy status: Secondary | ICD-10-CM | POA: Diagnosis not present

## 2015-10-26 DIAGNOSIS — R112 Nausea with vomiting, unspecified: Secondary | ICD-10-CM | POA: Insufficient documentation

## 2015-10-26 DIAGNOSIS — Z85858 Personal history of malignant neoplasm of other endocrine glands: Secondary | ICD-10-CM | POA: Diagnosis not present

## 2015-10-26 DIAGNOSIS — Z9689 Presence of other specified functional implants: Secondary | ICD-10-CM | POA: Insufficient documentation

## 2015-10-26 DIAGNOSIS — I10 Essential (primary) hypertension: Secondary | ICD-10-CM | POA: Diagnosis not present

## 2015-10-26 DIAGNOSIS — R110 Nausea: Secondary | ICD-10-CM

## 2015-10-26 DIAGNOSIS — Z85118 Personal history of other malignant neoplasm of bronchus and lung: Secondary | ICD-10-CM | POA: Insufficient documentation

## 2015-10-26 DIAGNOSIS — Z79899 Other long term (current) drug therapy: Secondary | ICD-10-CM | POA: Diagnosis not present

## 2015-10-26 DIAGNOSIS — M6281 Muscle weakness (generalized): Secondary | ICD-10-CM | POA: Diagnosis present

## 2015-10-26 DIAGNOSIS — R5383 Other fatigue: Secondary | ICD-10-CM | POA: Insufficient documentation

## 2015-10-26 DIAGNOSIS — Z87891 Personal history of nicotine dependence: Secondary | ICD-10-CM | POA: Insufficient documentation

## 2015-10-26 LAB — URINALYSIS, ROUTINE W REFLEX MICROSCOPIC
BILIRUBIN URINE: NEGATIVE
Glucose, UA: NEGATIVE mg/dL
Hgb urine dipstick: NEGATIVE
Ketones, ur: NEGATIVE mg/dL
Leukocytes, UA: NEGATIVE
NITRITE: NEGATIVE
PROTEIN: NEGATIVE mg/dL
SPECIFIC GRAVITY, URINE: 1.017 (ref 1.005–1.030)
pH: 6 (ref 5.0–8.0)

## 2015-10-26 LAB — CULTURE, BLOOD (ROUTINE X 2)
CULTURE: NO GROWTH
CULTURE: NO GROWTH

## 2015-10-26 LAB — CBC
HCT: 32.3 % — ABNORMAL LOW (ref 39.0–52.0)
HEMOGLOBIN: 10.9 g/dL — AB (ref 13.0–17.0)
MCH: 35.5 pg — ABNORMAL HIGH (ref 26.0–34.0)
MCHC: 33.7 g/dL (ref 30.0–36.0)
MCV: 105.2 fL — ABNORMAL HIGH (ref 78.0–100.0)
Platelets: 136 10*3/uL — ABNORMAL LOW (ref 150–400)
RBC: 3.07 MIL/uL — AB (ref 4.22–5.81)
RDW: 14.5 % (ref 11.5–15.5)
WBC: 9 10*3/uL (ref 4.0–10.5)

## 2015-10-26 LAB — BASIC METABOLIC PANEL
ANION GAP: 10 (ref 5–15)
BUN: 34 mg/dL — ABNORMAL HIGH (ref 6–20)
CALCIUM: 9.4 mg/dL (ref 8.9–10.3)
CO2: 21 mmol/L — ABNORMAL LOW (ref 22–32)
Chloride: 104 mmol/L (ref 101–111)
Creatinine, Ser: 1.19 mg/dL (ref 0.61–1.24)
GLUCOSE: 108 mg/dL — AB (ref 65–99)
Potassium: 4.3 mmol/L (ref 3.5–5.1)
Sodium: 135 mmol/L (ref 135–145)

## 2015-10-26 LAB — HEPATIC FUNCTION PANEL
ALT: 72 U/L — AB (ref 17–63)
AST: 48 U/L — ABNORMAL HIGH (ref 15–41)
Albumin: 3.2 g/dL — ABNORMAL LOW (ref 3.5–5.0)
Alkaline Phosphatase: 317 U/L — ABNORMAL HIGH (ref 38–126)
BILIRUBIN INDIRECT: 0.9 mg/dL (ref 0.3–0.9)
Bilirubin, Direct: 1.2 mg/dL — ABNORMAL HIGH (ref 0.1–0.5)
TOTAL PROTEIN: 7 g/dL (ref 6.5–8.1)
Total Bilirubin: 2.1 mg/dL — ABNORMAL HIGH (ref 0.3–1.2)

## 2015-10-26 LAB — CBG MONITORING, ED: GLUCOSE-CAPILLARY: 105 mg/dL — AB (ref 65–99)

## 2015-10-26 MED ORDER — SODIUM CHLORIDE 0.9 % IV BOLUS (SEPSIS)
1000.0000 mL | Freq: Once | INTRAVENOUS | Status: AC
  Administered 2015-10-26: 1000 mL via INTRAVENOUS

## 2015-10-26 NOTE — ED Notes (Signed)
MD at bedside. 

## 2015-10-26 NOTE — ED Triage Notes (Signed)
Patient was seen at [facility] on Tuesday for dehydration.  Patient recently finished chemo.  Patient is still weak and vomited once today.  Patient thinks dehydrated again.

## 2015-10-26 NOTE — ED Provider Notes (Signed)
[facility] DEPT Provider Note   CSN: [id number] Arrival date & time: 10/26/15  1714  First Provider Contact:  First MD Initiated Contact with Patient 10/26/15 1800        History   Chief Complaint Chief Complaint  Patient presents with  . Weakness  . Emesis    HPI [name] is a 70 y.o. male.  The history is provided by the patient and the spouse. No language interpreter was used.  Weakness   Emesis   Associated symptoms include vomiting.   [name] is a 70 y.o. male who presents to the Emergency Department complaining of dehydration.  He presents with his wife for concern of dehydration. He reports feeling generalized weakness and body aches for the last few days. He was seen in the emergency department 4 days ago for similar symptoms and received 3 L of IV fluids and felt better afterwards. He has a PEG tube for metastatic lung cancer and has recently finished abdominal radiation. He has been using his gtube for nutrition but does take some things orally.  Today he took a small pill today and he got choked on it and vomited once. No fever, chest pain. He has chronic shortness of breath, unchanged from baseline. No additional symptoms.  Past Medical History:  Diagnosis Date  . Abnormality of gait 03/12/2015  . Acute meniscal tear of knee RIGHT KNEE  . ARF (acute renal failure) ([place]) 03/2015   pre-renal patient was septic  . Arthritis RIGHT SHOULDER  . BPH (benign prostatic hypertrophy)   . Dizziness and giddiness 04/23/2014  . 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 HOH (hard of hearing)    bilateral hearing aids  . Hypertension   . Immature cataract BILATERAL  . Non-small cell lung cancer ([place]) DX SEPT 2010  W/ CHEMORADIATION AT THAT TIME -- NOW  W/ METS--  CURRENTLY ON MAINTENANCE CHEMO TX  EVERY 77 DAYS   ONCOLOGIST- DR [facility]- lung cancer with metastasis.  . Orthostatic hypotension    B/P  tends to run 110/70 or less -using med to raise B/P.  Marland Kitchen Pneumonia   . Radiation 01/08/09-01/21/09   Mediastinum 30 Gy x 12 fractions  . Radiation 08/19/14-09/04/14   Palliative RT Porta hepatic LN 30 Gy  . Sepsis ([place]) 03/2015  . Shortness of breath dyspnea    with exertion  . Swallowing impairment    "Unable to swallow"  . Tremor 04/23/2014   Right hand    Patient Active Problem List   Diagnosis Date Noted  . Bacteremia due to Klebsiella pneumoniae 04/29/2015  . Infection due to Enterobacter cloacae 04/29/2015  . Hypokalemia 04/29/2015  . Gram-negative bacteremia ([place]) 04/25/2015  . Septic shock ([place]) 04/23/2015  . Malignant neoplasm of lung ([place])   . Sepsis ([place])   . Acute kidney injury ([place])   . Abnormality of gait 03/12/2015  . Lung cancer ([place])   . Protein-calorie malnutrition, severe ([place]) 11/26/2014  . Hypoalbuminemia 11/18/2014  . Leukocytosis 11/18/2014  . Dysphagia 11/18/2014  . Nausea with vomiting 10/03/2014  . Central line complication 84/69/6295  . Anorexia 10/03/2014  . Weight loss 10/03/2014  . Transaminitis 10/03/2014  . Hyperphosphatemia 10/03/2014  . Encounter for antineoplastic immunotherapy 09/28/2014  . Non-small cell lung cancer ([place]) 09/21/2014  . Abdominal pain   . Fever   . 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 Biliary obstruction   . Fatigue 08/27/2014  .  Abdominal pain, acute   . Abdominal mass   . Abdominal pain, generalized 08/07/2014  . CRF (chronic renal failure) 08/07/2014  . Obstructive jaundice 08/07/2014  . Malnutrition of moderate degree ([place]) 08/07/2014  . Obstructive jaundice due to cancer 08/06/2014  . Rotator cuff tear, non-traumatic 06/05/2014  . Dizziness and giddiness 04/23/2014  . Tremor 04/23/2014  . Acute on chronic renal failure ([place]) 05/24/2013  . Pancytopenia ([place]) 05/24/2013  . Osteoarthritis of right knee 12/21/2011  . Meniscus, medial, posterior horn derangement 12/21/2011  .  Dyspnea 12/04/2010  . Bronchogenic cancer of left lung ([place]) 12/11/2008  . ARTHRITIS 12/10/2008    Past Surgical History:  Procedure Laterality Date  . BILATERAL EAR DRUM SURGERY  1960'S  . BILE DUCT STENT PLACEMENT    . CATARACT EXTRACTION Bilateral   . ERCP N/A 08/09/2014   Procedure: ENDOSCOPIC RETROGRADE CHOLANGIOPANCREATOGRAPHY (ERCP);  Surgeon: Carol Ada, MD;  Location: [facility] ENDOSCOPY;  Service: Endoscopy;  Laterality: N/A;  . ERCP N/A 05/23/2015   Procedure: ENDOSCOPIC RETROGRADE CHOLANGIOPANCREATOGRAPHY (ERCP);  Surgeon: Carol Ada, MD;  Location: [facility] ENDOSCOPY;  Service: Endoscopy;  Laterality: N/A;  . ERCP N/A 05/30/2015   Procedure: ENDOSCOPIC RETROGRADE CHOLANGIOPANCREATOGRAPHY (ERCP);  Surgeon: Carol Ada, MD;  Location: [facility]OSCOPY;  Service: Endoscopy;  Laterality: N/A;  . ESOPHAGOGASTRODUODENOSCOPY (EGD) WITH PROPOFOL N/A 08/09/2014   Procedure: ESOPHAGOGASTRODUODENOSCOPY (EGD) WITH PROPOFOL;  Surgeon: Carol Ada, MD;  Location: [facility] ENDOSCOPY;  Service: Endoscopy;  Laterality: N/A;  . EUS N/A 08/09/2014   Procedure: UPPER ENDOSCOPIC ULTRASOUND (EUS) RADIAL;  Surgeon: Carol Ada, MD;  Location: [facility] ENDOSCOPY;  Service: Endoscopy;  Laterality: N/A;  . FINE NEEDLE ASPIRATION N/A 08/09/2014   Procedure: FINE NEEDLE ASPIRATION (FNA) LINEAR;  Surgeon: Carol Ada, MD;  Location: [facility] ENDOSCOPY;  Service: Endoscopy;  Laterality: N/A;  . GASTROSTOMY TUBE PLACEMENT  10/2014  . KNEE ARTHROSCOPY  12/21/2011   Procedure: ARTHROSCOPY KNEE;  Surgeon: Tobi Bastos, MD;  Location: [facility];  Service: Orthopedics;  Laterality: Right;  WITH MEDIAL MENISECTOMY  . LARYNGOSCOPY  01/2015 and 04/21/15  . ORIF LEFT RING FINGER  AND REVASCULARIZATION OF RADIAL SIDE  02-16-2007   DEGLOVING INJURY  . [place] cath    . PORTACATH PLACEMENT  Removed 06/20/3015   as of 05-27-15 remains right chest  . ROTATOR CUFF REPAIR  2008   LEFT SHOULDER  . SHOULDER OPEN ROTATOR CUFF REPAIR Right  06/05/2014   Procedure: RIGHT ROTATOR CUFF REPAIR SHOULDER OPEN;  Surgeon: Latanya Maudlin, MD;  Location: [facility];  Service: Orthopedics;  Laterality: Right;  . TONSILLECTOMY     as child       Home Medications    Prior to Admission medications   Medication Sig Start Date End Date Taking? Authorizing Provider  b complex vitamins tablet Take 1 tablet by mouth daily.   Yes Historical Provider, MD  dicyclomine (BENTYL) 20 MG tablet Take 20 mg by mouth every 6 (six) hours as needed (for abdominal cramps).   Yes Historical Provider, MD  diphenhydramine-acetaminophen (TYLENOL PM) 25-500 MG TABS tablet Take 1 tablet by mouth at bedtime as needed (for sleep).    Yes Historical Provider, MD  FeFum-FePoly-FA-B Cmp-C-Biot (INTEGRA PLUS) CAPS Give 1 capsule by tube daily.   Yes Historical Provider, MD  HYDROmorphone (DILAUDID) 2 MG tablet Take 2-4 mg by mouth every 4 (four) hours as needed for severe pain.   Yes Historical Provider, MD  LORazepam (ATIVAN) 1 MG tablet Take 0.5-1 mg by mouth every 4 (  four) hours as needed for anxiety (and/or nausea).   Yes Historical Provider, MD  midodrine (PROAMATINE) 5 MG tablet Take 5 mg by mouth 3 (three) times daily with meals.   Yes Historical Provider, MD  Multiple Vitamin (MULTIVITAMIN WITH MINERALS) TABS tablet Take 1 tablet by mouth daily.   Yes Historical Provider, MD  mupirocin ointment (BACTROBAN) 2 % Apply 1 application topically 3 (three) times daily. Pt applies around tube area.   Yes Historical Provider, MD  naproxen sodium (ANAPROX) 220 MG tablet Take 220-440 mg by mouth 2 (two) times daily as needed (for pain).    Yes Historical Provider, MD  Nutritional Supplements (FEEDING SUPPLEMENT, OSMOLITE 1.5 CAL,) LIQD Place 237 mLs into feeding tube 3 (three) times daily.    Yes Historical Provider, MD  Nutritional Supplements (TWOCAL HN) LIQD Place 237 mLs into feeding tube 3 (three) times daily.   Yes Historical Provider, MD  omeprazole (PRILOSEC) 40 MG capsule  Take 40 mg by mouth at bedtime.    Yes Historical Provider, MD  ondansetron (ZOFRAN) 8 MG tablet Take 1 tablet (8 mg total) by mouth every 8 (eight) hours as needed for nausea or vomiting. 05/14/15  Yes [name], MD  promethazine (PHENERGAN) 25 MG tablet Take 25 mg by mouth every 6 (six) hours as needed for nausea or vomiting.   Yes Historical Provider, MD    Family History Family History  Problem Relation Age of Onset  . Congestive Heart Failure Mother   . Cancer Sister   . Cancer Maternal Grandfather   . Colon cancer Father   . Heart attack Brother   . Colon cancer    . Heart attack    . Cancer    . Hypertension    . Hyperlipidemia      Social History Social History  Substance Use Topics  . Smoking status: Former Smoker    Packs/day: 2.00    Years: 20.00    Types: Cigarettes    Quit date: 03/17/1981  . Smokeless tobacco: Never Used  . Alcohol use No     Allergies   Fentanyl; Augmentin [amoxicillin-pot clavulanate]; Levaquin [levofloxacin]; Percocet [oxycodone-acetaminophen]; Scopolamine; Zolpidem tartrate; and Carboplatin   Review of Systems Review of Systems  Gastrointestinal: Positive for vomiting.  Neurological: Positive for weakness.  All other systems reviewed and are negative.    Physical Exam Updated Vital Signs BP 99/71   Pulse 86   Temp 97.6 F (36.4 C)   Resp 14   SpO2 100%   Physical Exam  Constitutional: He is oriented to person, place, and time. He appears well-developed and well-nourished.  HENT:  Head: Normocephalic and atraumatic.  Cardiovascular: Normal rate and regular rhythm.   No murmur heard. Pulmonary/Chest: Effort normal and breath sounds normal. No respiratory distress.  Abdominal: Soft. There is no tenderness. There is no rebound and no guarding.  Peg tube in LUQ  Musculoskeletal: He exhibits no edema or tenderness.  Neurological: He is alert and oriented to person, place, and time.  Skin: Skin is warm and dry.    Psychiatric: He has a normal mood and affect. His behavior is normal.  Nursing note and vitals reviewed.    ED Treatments / Results  Labs (all labs ordered are listed, but only abnormal results are displayed) Labs Reviewed  BASIC METABOLIC PANEL - Abnormal; Notable for the following:       Result Value   CO2 21 (*)    Glucose, Bld 108 (*)    BUN  34 (*)    All other components within normal limits  CBC - Abnormal; Notable for the following:    RBC 3.07 (*)    Hemoglobin 10.9 (*)    HCT 32.3 (*)    MCV 105.2 (*)    MCH 35.5 (*)    Platelets 136 (*)    All other components within normal limits  URINALYSIS, ROUTINE W REFLEX MICROSCOPIC (NOT AT [facility]) - Abnormal; Notable for the following:    Color, Urine AMBER (*)    All other components within normal limits  HEPATIC FUNCTION PANEL - Abnormal; Notable for the following:    Albumin 3.2 (*)    AST 48 (*)    ALT 72 (*)    Alkaline Phosphatase 317 (*)    Total Bilirubin 2.1 (*)    Bilirubin, Direct 1.2 (*)    All other components within normal limits  CBG MONITORING, ED - Abnormal; Notable for the following:    Glucose-Capillary 105 (*)    All other components within normal limits    EKG  EKG Interpretation  Date/Time:  Sunday October 26 2015 17:27:31 EDT Ventricular Rate:  91 PR Interval:    QRS Duration: 73 QT Interval:  339 QTC Calculation: 417 R Axis:   18 Text Interpretation:  Sinus rhythm ST-t wave abnormality Baseline wander Artifact No significant change since last tracing Abnormal ekg Confirmed by Carmin Muskrat  MD [phone]) on 10/26/2015 5:39:26 PM       Radiology No results found.  Procedures Procedures (including critical care time)  Medications Ordered in ED Medications  sodium chloride 0.9 % bolus 1,000 mL (0 mLs Intravenous Stopped 10/26/15 2053)     Initial Impression / Assessment and Plan / ED Course  I have reviewed the triage vital signs and the nursing notes.  Pertinent labs & imaging  results that were available during my care of the patient were reviewed by me and considered in my medical decision making (see chart for details).  Clinical Course  Patient with metastatic lung cancer here with nausea, generalized weakness and fatigue. He is nontoxic appearing on examination and generally deconditioned. He does feel improved after IV fluids. No evidence of obstruction at this time. Labs with stable hepatic dysfunction. Plan to DC home with close oncology follow-up. Discussed continuing his tube feeds and avoiding taking any of his pills by mouth. Close return precautions discussed.  Final Clinical Impressions(s) / ED Diagnoses   Final diagnoses:  Nausea    New Prescriptions Discharge Medication List as of 10/26/2015  8:51 PM       Quintella Reichert, MD 10/26/15 2243

## 2015-10-27 ENCOUNTER — Ambulatory Visit: Payer: Medicare Other

## 2015-10-27 ENCOUNTER — Telehealth: Payer: Self-pay | Admitting: Medical Oncology

## 2015-10-27 DIAGNOSIS — R1312 Dysphagia, oropharyngeal phase: Secondary | ICD-10-CM | POA: Diagnosis not present

## 2015-10-27 NOTE — Telephone Encounter (Signed)
Wife requesting advice for pt who is feeling "run down , dehydrated , has been to ED twice recently for fluids" pt completed xrt two weeks ago-Call forwarded to Dr Ida Rogue nurse -

## 2015-10-27 NOTE — Therapy (Signed)
[facility] [address] [place], [place], [address] Phone: [phone]   Fax:  [phone]  Speech Language Pathology Treatment  Patient Details  Name: [name] MRN: [id number] Date of Birth: June 16, 1945 Referring Provider: Dr. [name]  Encounter Date: 10/27/2015      End of Session - 10/27/15 1155    Visit Number 4   Number of Visits 17   Date for SLP Re-Evaluation 11/20/15   SLP Start Time 1103   SLP Stop Time  1134   SLP Time Calculation (min) 31 min   Activity Tolerance Patient limited by fatigue      Past Medical History:  Diagnosis Date  . Abnormality of gait 03/12/2015  . Acute meniscal tear of knee RIGHT KNEE  . ARF (acute renal failure) ([place]) 03/2015   pre-renal patient was septic  . Arthritis RIGHT SHOULDER  . BPH (benign prostatic hypertrophy)   . Dizziness and giddiness 04/23/2014  . 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 HOH (hard of hearing)    bilateral hearing aids  . Hypertension   . Immature cataract BILATERAL  . Non-small cell lung cancer ([place]) DX SEPT 2010  W/ CHEMORADIATION AT THAT TIME -- NOW  W/ METS--  CURRENTLY ON MAINTENANCE CHEMO TX  EVERY 68 DAYS   ONCOLOGIST- DR [facility]- lung cancer with metastasis.  . Orthostatic hypotension    B/P tends to run 110/70 or less -using med to raise B/P.  Marland Kitchen Pneumonia   . Radiation 01/08/09-01/21/09   Mediastinum 30 Gy x 12 fractions  . Radiation 08/19/14-09/04/14   Palliative RT Porta hepatic LN 30 Gy  . Sepsis ([place]) 03/2015  . Shortness of breath dyspnea    with exertion  . Swallowing impairment    "Unable to swallow"  . Tremor 04/23/2014   Right hand    Past Surgical History:  Procedure Laterality Date  . BILATERAL EAR DRUM SURGERY  1960'S  . BILE DUCT STENT PLACEMENT    . CATARACT EXTRACTION Bilateral   . ERCP N/A 08/09/2014   Procedure: ENDOSCOPIC RETROGRADE  CHOLANGIOPANCREATOGRAPHY (ERCP);  Surgeon: Carol Ada, MD;  Location: [facility];  Service: Endoscopy;  Laterality: N/A;  . ERCP N/A 05/23/2015   Procedure: ENDOSCOPIC RETROGRADE CHOLANGIOPANCREATOGRAPHY (ERCP);  Surgeon: Carol Ada, MD;  [facility];  Service: Endoscopy;  Laterality: N/A;  . ERCP N/A 05/30/2015   Procedure: ENDOSCOPIC RETROGRADE CHOLANGIOPANCREATOGRAPHY (ERCP);  Surgeon: Carol Ada, MD;  Location: [facility]OSCOPY;  Service: Endoscopy;  Laterality: N/A;  . ESOPHAGOGASTRODUODENOSCOPY (EGD) WITH PROPOFOL N/A 08/09/2014   Procedure: ESOPHAGOGASTRODUODENOSCOPY (EGD) WITH PROPOFOL;  Surgeon: Carol Ada, MD;  Location: [facility] ENDOSCOPY;  Service: Endoscopy;  Laterality: N/A;  . EUS N/A 08/09/2014   Procedure: UPPER ENDOSCOPIC ULTRASOUND (EUS) RADIAL;  Surgeon: Carol Ada, MD;  Location: [facility] ENDOSCOPY;  Service: Endoscopy;  Laterality: N/A;  . FINE NEEDLE ASPIRATION N/A 08/09/2014   Procedure: FINE NEEDLE ASPIRATION (FNA) LINEAR;  Surgeon: Carol Ada, MD;  Location: [facility] ENDOSCOPY;  Service: Endoscopy;  Laterality: N/A;  . GASTROSTOMY TUBE PLACEMENT  10/2014  . KNEE ARTHROSCOPY  12/21/2011   Procedure: ARTHROSCOPY KNEE;  Surgeon: Tobi Bastos, MD;  [facility];  Service: Orthopedics;  Laterality: Right;  WITH MEDIAL MENISECTOMY  . LARYNGOSCOPY  01/2015 and 04/21/15  . ORIF LEFT RING FINGER  AND REVASCULARIZATION OF RADIAL SIDE  02-16-2007   DEGLOVING INJURY  . [place] cath    . PORTACATH  PLACEMENT  Removed 06/20/3015   as of 05-27-15 remains right chest  . ROTATOR CUFF REPAIR  2008   LEFT SHOULDER  . SHOULDER OPEN ROTATOR CUFF REPAIR Right 06/05/2014   Procedure: RIGHT ROTATOR CUFF REPAIR SHOULDER OPEN;  Surgeon: Latanya Maudlin, MD;  Location: [facility] ORS;  Service: Orthopedics;  Laterality: Right;  . TONSILLECTOMY     as child    There were no vitals filed for this visit.      Subjective Assessment - 10/27/15 1111    Subjective Pt was in ED due to  dehydration yesterday. Fatigued.   Patient is accompained by: Family member   Special Tests spouse   Currently in Pain? No/denies               ADULT SLP TREATMENT - 10/27/15 1112      General Information   Behavior/Cognition Alert;Cooperative;Pleasant mood   Patient Positioning Upright in chair     Treatment Provided   Treatment provided Dysphagia     Dysphagia Treatment   Respiratory Status Room air   Treatment Methods Therapeutic exercise;Skilled observation   Patient observed directly with PO's Yes   Type of PO's observed Thin liquids  small sips H2O   Other treatment/comments Pt stated oral care was completed prior to tx. He stated "IDK" when asked about guidelines re: water protocol.  Wife reminded pt of oral care, told SLP that pt was not completing oral care, and then pt successfully provided reasoning behind oral care for SLP. SLP agreed with pt and reiterated the need for oral care prior to PO water. Pt performed HEP with rare min A. Pt with hydrophonic voice during "ga-ga-ga" exercise, and SLP-cued swallows cleared his voice consistently. No overt s/s aspiration PNA today. Pt provided s/s aspiration with modifed independence.     Assessment / Recommendations / Plan   Plan Continue with current plan of care     Dysphagia Recommendations   Diet recommendations NPO     Progression Toward Goals   Progression toward goals Progressing toward goals          SLP Education - 10/27/15 1155    Education provided Yes   Education Details need for oral care prior to PO water, HEP   Person(s) Educated Patient;Spouse   Methods Explanation;Demonstration;Verbal cues   Comprehension Verbalized understanding;Returned demonstration          SLP Short Term Goals - 10/27/15 1157      SLP SHORT TERM GOAL #1   Title Pt will perform dysphagia HEP with occasional min A   Status Achieved     SLP SHORT TERM GOAL #2   Title Pt will demonstrate/verbalize precautions for free  water protocol with occasional min A   Time 2   Period Weeks   Status On-going          SLP Long Term Goals - 10/27/15 1158      SLP LONG TERM GOAL #1   Title Pt will perform swallow HEP with rare min A over 4 sessions   Baseline 10-27-15,   Time 6   Period Weeks   Status Revised     SLP LONG TERM GOAL #2   Title Pt will demonstrate clinical gains/readiness for repeat objective swallowing assessment prior to initiation of PO   Time 6   Period Weeks   Status On-going          Plan - 10/27/15 1156    Clinical Impression Statement Pt's severe dysphagia persists. Pt req'd  min A rarely from SLP today to complete HEP. SLP provided reiteration of need for oral care <30 minutes prior to intake of PO water. Skilled ST to cont to maximize ability for PO intake with minimal aspiration risk. As pt becomes mod I with HEP, consider decreasing to 1x a week   Speech Therapy Frequency 2x / week   Duration --  6 weeks   Treatment/Interventions SLP instruction and feedback;Internal/external aids;Compensatory strategies;Pharyngeal strengthening exercises;Oral motor exercises;Trials of upgraded texture/liquids;Patient/family education;Diet toleration management by SLP;Aspiration precaution training   Potential to Achieve Goals Fair   Potential Considerations Severity of impairments;Pain level;Co-morbidities      Patient will benefit from skilled therapeutic intervention in order to improve the following deficits and impairments:   Dysphagia, oropharyngeal phase    Problem List Patient Active Problem List   Diagnosis Date Noted  . Bacteremia due to Klebsiella pneumoniae 04/29/2015  . Infection due to Enterobacter cloacae 04/29/2015  . Hypokalemia 04/29/2015  . Gram-negative bacteremia ([place]) 04/25/2015  . Septic shock ([place]) 04/23/2015  . Malignant neoplasm of lung ([place])   . Sepsis ([place])   . Acute kidney injury ([place])   . Abnormality of gait 03/12/2015  . Lung cancer ([place])   .  Protein-calorie malnutrition, severe ([place]) 11/26/2014  . Hypoalbuminemia 11/18/2014  . Leukocytosis 11/18/2014  . Dysphagia 11/18/2014  . Nausea with vomiting 10/03/2014  . Central line complication 48/27/0786  . Anorexia 10/03/2014  . Weight loss 10/03/2014  . Transaminitis 10/03/2014  . Hyperphosphatemia 10/03/2014  . Encounter for antineoplastic immunotherapy 09/28/2014  . Non-small cell lung cancer ([place]) 09/21/2014  . Abdominal pain   . Fever   . 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 Biliary obstruction   . Fatigue 08/27/2014  . Abdominal pain, acute   . Abdominal mass   . Abdominal pain, generalized 08/07/2014  . CRF (chronic renal failure) 08/07/2014  . Obstructive jaundice 08/07/2014  . Malnutrition of moderate degree ([place]) 08/07/2014  . Obstructive jaundice due to cancer 08/06/2014  . Rotator cuff tear, non-traumatic 06/05/2014  . Dizziness and giddiness 04/23/2014  . Tremor 04/23/2014  . Acute on chronic renal failure ([place]) 05/24/2013  . Pancytopenia ([place]) 05/24/2013  . Osteoarthritis of right knee 12/21/2011  . Meniscus, medial, posterior horn derangement 12/21/2011  . Dyspnea 12/04/2010  . Bronchogenic cancer of left lung ([place]) 12/11/2008  . ARTHRITIS 12/10/2008    SCHINKE,CARL ,MS, [place]  10/27/2015, 11:59 AM  [facility] [address] [place], [place], [address] Phone: [phone]   Fax:  [phone]   Name: Damarea Merkel MRN: [id number] Date of Birth: [birth year]

## 2015-10-28 ENCOUNTER — Telehealth: Payer: Self-pay | Admitting: *Deleted

## 2015-10-28 NOTE — Telephone Encounter (Signed)
Called and spoke with the patient wife, and let wife know I was out of town yesterday and got a message from Abelina Bachelor, RN this am on my phone, per wife patient is feeling a little better last night and this am, still weak, no fever blood pressure is good, saw that he was sen yesterday by Garald Balding, dysphasia, encouraged to continue  free water via peg and  And his tube feedings, asked if any nausea, none today he has nausea medication, will call if symptoms get worse and patient can be seen ,did tell wife fatigue can take several weeks to get better, wife thanked this RN for the call 9:25 AM

## 2015-10-30 ENCOUNTER — Ambulatory Visit: Payer: Medicare Other | Attending: Family Medicine

## 2015-10-30 DIAGNOSIS — R42 Dizziness and giddiness: Secondary | ICD-10-CM | POA: Insufficient documentation

## 2015-10-30 DIAGNOSIS — R1312 Dysphagia, oropharyngeal phase: Secondary | ICD-10-CM | POA: Insufficient documentation

## 2015-10-30 DIAGNOSIS — M6281 Muscle weakness (generalized): Secondary | ICD-10-CM | POA: Insufficient documentation

## 2015-10-30 DIAGNOSIS — R2689 Other abnormalities of gait and mobility: Secondary | ICD-10-CM | POA: Insufficient documentation

## 2015-11-04 ENCOUNTER — Ambulatory Visit: Payer: Medicare Other | Admitting: Speech Pathology

## 2015-11-04 DIAGNOSIS — R1312 Dysphagia, oropharyngeal phase: Secondary | ICD-10-CM | POA: Diagnosis not present

## 2015-11-04 DIAGNOSIS — R2689 Other abnormalities of gait and mobility: Secondary | ICD-10-CM | POA: Diagnosis present

## 2015-11-04 DIAGNOSIS — M6281 Muscle weakness (generalized): Secondary | ICD-10-CM | POA: Diagnosis present

## 2015-11-04 DIAGNOSIS — R42 Dizziness and giddiness: Secondary | ICD-10-CM | POA: Diagnosis present

## 2015-11-04 NOTE — Patient Instructions (Addendum)
  Ask Dr. [name] for PT order due to deconditioning and reduced ambulation  Mention that we are considering repeat swallow study in a few weeks -   ST 1x a week for 3 more weeks  Do swallowing exercises throughout the day - riding in the car, TV, waiting rooms, bathroom

## 2015-11-04 NOTE — Therapy (Signed)
[facility] [address] [place], [place], [address] Phone: [phone]   Fax:  [phone]  Speech Language Pathology Treatment  Patient Details  Name: [name] MRN: [id number] Date of Birth: 16-Apr-1945 Referring Provider: Dr. [name]  Encounter Date: 11/04/2015      End of Session - 11/04/15 1153    Visit Number 5   Number of Visits 17   Date for SLP Re-Evaluation 11/20/15   SLP Start Time 1104   SLP Stop Time  1140   SLP Time Calculation (min) 36 min      Past Medical History:  Diagnosis Date  . Abnormality of gait 03/12/2015  . Acute meniscal tear of knee RIGHT KNEE  . ARF (acute renal failure) ([place]) 03/2015   pre-renal patient was septic  . Arthritis RIGHT SHOULDER  . BPH (benign prostatic hypertrophy)   . Dizziness and giddiness 04/23/2014  . 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 HOH (hard of hearing)    bilateral hearing aids  . Hypertension   . Immature cataract BILATERAL  . Non-small cell lung cancer ([place]) DX SEPT 2010  W/ CHEMORADIATION AT THAT TIME -- NOW  W/ METS--  CURRENTLY ON MAINTENANCE CHEMO TX  EVERY 9 DAYS   ONCOLOGIST- DR [facility]- lung cancer with metastasis.  . Orthostatic hypotension    B/P tends to run 110/70 or less -using med to raise B/P.  Marland Kitchen Pneumonia   . Radiation 01/08/09-01/21/09   Mediastinum 30 Gy x 12 fractions  . Radiation 08/19/14-09/04/14   Palliative RT Porta hepatic LN 30 Gy  . Sepsis ([place]) 03/2015  . Shortness of breath dyspnea    with exertion  . Swallowing impairment    "Unable to swallow"  . Tremor 04/23/2014   Right hand    Past Surgical History:  Procedure Laterality Date  . BILATERAL EAR DRUM SURGERY  1960'S  . BILE DUCT STENT PLACEMENT    . CATARACT EXTRACTION Bilateral   . ERCP N/A 08/09/2014   Procedure: ENDOSCOPIC RETROGRADE CHOLANGIOPANCREATOGRAPHY (ERCP);  Surgeon: Carol Ada,  MD;  Location: [facility];  Service: Endoscopy;  Laterality: N/A;  . ERCP N/A 05/23/2015   Procedure: ENDOSCOPIC RETROGRADE CHOLANGIOPANCREATOGRAPHY (ERCP);  Surgeon: Carol Ada, MD;  Location: [facility] ENDOSCOPY;  Service: Endoscopy;  Laterality: N/A;  . ERCP N/A 05/30/2015   Procedure: ENDOSCOPIC RETROGRADE CHOLANGIOPANCREATOGRAPHY (ERCP);  Surgeon: Carol Ada, MD;  Location: [facility];  Service: Endoscopy;  Laterality: N/A;  . ESOPHAGOGASTRODUODENOSCOPY (EGD) WITH PROPOFOL N/A 08/09/2014   Procedure: ESOPHAGOGASTRODUODENOSCOPY (EGD) WITH PROPOFOL;  Surgeon: Carol Ada, MD;  [facility]: [facility];  Service: Endoscopy;  Laterality: N/A;  . EUS N/A 08/09/2014   Procedure: UPPER ENDOSCOPIC ULTRASOUND (EUS) RADIAL;  Surgeon: Carol Ada, MD;  Location: [facility] ENDOS[facility];  Service: Endoscopy;  Laterality: N/A;  . FINE NEEDLE ASPIRATION N/A 08/09/2014   Procedure: FINE NEEDLE ASPIRATION (FNA) LINEAR;  Surgeon: Carol Ada, MD;  Location: [facility] ENDOS[facility];  Service: Endoscopy;  Laterality: N/A;  . GASTROSTOMY TUBE PLACEMENT  10/2014  . KNEE ARTHROSCOPY  12/21/2011   Procedure: ARTHROSCOPY KNEE;  Surgeon: Tobi Bastos, MD;  Location: [facility];  Service: Orthopedics;  Laterality: Right;  WITH MEDIAL MENISECTOMY  . LARYNGOSCOPY  01/2015 and 04/21/15  . ORIF LEFT RING FINGER  AND REVASCULARIZATION OF RADIAL SIDE  02-16-2007   DEGLOVING INJURY  . [place] cath    . PORTACATH PLACEMENT  Removed 06/20/3015   as of  05-27-15 remains right chest  . ROTATOR CUFF REPAIR  2008   LEFT SHOULDER  . SHOULDER OPEN ROTATOR CUFF REPAIR Right 06/05/2014   Procedure: RIGHT ROTATOR CUFF REPAIR SHOULDER OPEN;  Surgeon: Latanya Maudlin, MD;  Location: [facility];  Service: Orthopedics;  Laterality: Right;  . TONSILLECTOMY     as child    There were no vitals filed for this visit.      Subjective Assessment - 11/04/15 1112    Subjective "I do the exercises once a day - I don't seem to have time to do them twice"    Patient is accompained by: Family member   Special Tests spouse   Currently in Pain? No/denies               ADULT SLP TREATMENT - 11/04/15 1113      General Information   Behavior/Cognition Alert;Cooperative;Pleasant mood     Treatment Provided   Treatment provided Dysphagia     Dysphagia Treatment   Respiratory Status Room air   Treatment Methods Therapeutic exercise;Skilled observation   Patient observed directly with PO's Yes   Type of PO's observed Thin liquids   Other treatment/comments HEP with supervision cues/SBA - pt reports he feels his swallow is stronger and the exercises have become easier for him.           SLP Education - 11/04/15 1150    Education provided Yes   Education Details oral care before water; continue consistent follow up with HEP twice daily   Person(s) Educated Patient;Spouse   Methods Explanation;Demonstration;Handout   Comprehension Verbalized understanding;Returned demonstration          SLP Short Term Goals - 11/04/15 1153      SLP SHORT TERM GOAL #1   Title Pt will perform dysphagia HEP with occasional min A   Status Achieved     SLP SHORT TERM GOAL #2   Title Pt will demonstrate/verbalize precautions for free water protocol with occasional min A   Time 2   Period Weeks   Status Achieved          SLP Long Term Goals - 11/04/15 1153      SLP LONG TERM GOAL #1   Title Pt will perform swallow HEP with rare min A over 4 sessions   Baseline 10-27-15, 11/04/15   Time 5   Period Weeks   Status Revised     SLP LONG TERM GOAL #2   Title Pt will demonstrate clinical gains/readiness for repeat objective swallowing assessment prior to initiation of PO   Time 5   Period Weeks   Status On-going          Plan - 11/04/15 1150    Clinical Impression Statement Pt performed HEP for dysphagia with SBA - I decreased him to 1x a week. If progress and clinical gains continue, we discussed repeat objective swallow study in 2-4  weeks. I phoned Dr. Evette Doffing office for PT orders due to deconditioning and  reduced ambulation, using wheel chair and not rolling walker.   Speech Therapy Frequency 1x /week   Duration --  3-4 weeks   Treatment/Interventions SLP instruction and feedback;Internal/external aids;Compensatory strategies;Pharyngeal strengthening exercises;Oral motor exercises;Trials of upgraded texture/liquids;Patient/family education;Diet toleration management by SLP;Aspiration precaution training   Potential to Achieve Goals Fair   Potential Considerations Severity of impairments;Pain level;Co-morbidities   Consulted and Agree with Plan of Care Patient;Family member/caregiver   Family Member Consulted spouse      Patient will benefit  from skilled therapeutic intervention in order to improve the following deficits and impairments:   Dysphagia, oropharyngeal phase    Problem List Patient Active Problem List   Diagnosis Date Noted  . Bacteremia due to Klebsiella pneumoniae 04/29/2015  . Infection due to Enterobacter cloacae 04/29/2015  . Hypokalemia 04/29/2015  . Gram-negative bacteremia ([place]) 04/25/2015  . Septic shock ([place]) 04/23/2015  . Malignant neoplasm of lung ([place])   . Sepsis ([place])   . Acute kidney injury ([place])   . Abnormality of gait 03/12/2015  . Lung cancer ([place])   . Protein-calorie malnutrition, severe ([place]) 11/26/2014  . Hypoalbuminemia 11/18/2014  . Leukocytosis 11/18/2014  . Dysphagia 11/18/2014  . Nausea with vomiting 10/03/2014  . Central line complication 64/15/8309  . Anorexia 10/03/2014  . Weight loss 10/03/2014  . Transaminitis 10/03/2014  . Hyperphosphatemia 10/03/2014  . Encounter for antineoplastic immunotherapy 09/28/2014  . Non-small cell lung cancer ([place]) 09/21/2014  . Abdominal pain   . Fever   . 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 Biliary obstruction   . Fatigue 08/27/2014  . Abdominal pain, acute   . Abdominal mass   .  Abdominal pain, generalized 08/07/2014  . CRF (chronic renal failure) 08/07/2014  . Obstructive jaundice 08/07/2014  . Malnutrition of moderate degree ([place]) 08/07/2014  . Obstructive jaundice due to cancer 08/06/2014  . Rotator cuff tear, non-traumatic 06/05/2014  . Dizziness and giddiness 04/23/2014  . Tremor 04/23/2014  . Acute on chronic renal failure ([place]) 05/24/2013  . Pancytopenia ([place]) 05/24/2013  . Osteoarthritis of right knee 12/21/2011  . Meniscus, medial, posterior horn derangement 12/21/2011  . Dyspnea 12/04/2010  . Bronchogenic cancer of left lung ([place]) 12/11/2008  . ARTHRITIS 12/10/2008    Lovvorn, Annye Rusk MS, CCC-SLP 11/04/2015, 11:54 AM  [facility] [address] [place], [place], [address] Phone: [phone]   Fax:  [phone]   Name: [name] MRN: [id number] Date of Birth: 05-29-1945

## 2015-11-06 ENCOUNTER — Encounter: Payer: Medicare Other | Admitting: Speech Pathology

## 2015-11-07 ENCOUNTER — Other Ambulatory Visit: Payer: Self-pay | Admitting: Medical Oncology

## 2015-11-07 NOTE — Progress Notes (Signed)
°  Radiation Oncology         [phone]) [phone] ________________________________  Name: [name] MRN: [id number]  Date: 10/13/2015  DOB: [birth year]  End of Treatment Note  Diagnosis:      ICD-9-CM ICD-10-CM   1. 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     Indication for treatment:  Palliative       Radiation treatment dates:   09/29/2015 to 10/13/2015  Site/dose:   The Right retroperitoneal LN was treated to 30 Gy in 10 fractions at 3 Gy per fraction.  Beams/energy:   3D // 6X  Narrative: The patient tolerated radiation treatment relatively well.   He experienced mild fatigue with treatment. He also complained of mild pain in the right groin. He denied diarrhea.   Plan: The patient has completed radiation treatment. The patient will return to radiation oncology clinic for routine followup in one month. I advised them to call or return sooner if they have any questions or concerns related to their recovery or treatment.  ------------------------------------------------  Jodelle Gross, MD, PhD  This document serves as a record of services personally performed by Kyung Rudd, MD. It was created on his behalf by Arlyce Harman, a trained medical scribe. The creation of this record is based on the scribe's personal observations and the provider's statements to them. This document has been checked and approved by the attending provider.

## 2015-11-10 ENCOUNTER — Other Ambulatory Visit (HOSPITAL_BASED_OUTPATIENT_CLINIC_OR_DEPARTMENT_OTHER): Payer: Medicare Other

## 2015-11-10 ENCOUNTER — Ambulatory Visit (HOSPITAL_BASED_OUTPATIENT_CLINIC_OR_DEPARTMENT_OTHER): Payer: Medicare Other | Admitting: Internal Medicine

## 2015-11-10 ENCOUNTER — Encounter: Payer: Self-pay | Admitting: Internal Medicine

## 2015-11-10 ENCOUNTER — Telehealth: Payer: Self-pay | Admitting: Internal Medicine

## 2015-11-10 VITALS — BP 121/81 | HR 90 | Temp 97.2°F | Resp 18 | Ht 68.0 in | Wt 130.3 lb

## 2015-11-10 DIAGNOSIS — C3490 Malignant neoplasm of unspecified part of unspecified bronchus or lung: Secondary | ICD-10-CM

## 2015-11-10 DIAGNOSIS — C3492 Malignant neoplasm of unspecified part of left bronchus or lung: Secondary | ICD-10-CM

## 2015-11-10 DIAGNOSIS — E43 Unspecified severe protein-calorie malnutrition: Secondary | ICD-10-CM

## 2015-11-10 DIAGNOSIS — E440 Moderate protein-calorie malnutrition: Secondary | ICD-10-CM

## 2015-11-10 DIAGNOSIS — E8809 Other disorders of plasma-protein metabolism, not elsewhere classified: Secondary | ICD-10-CM

## 2015-11-10 DIAGNOSIS — R1310 Dysphagia, unspecified: Secondary | ICD-10-CM

## 2015-11-10 DIAGNOSIS — C786 Secondary malignant neoplasm of retroperitoneum and peritoneum: Secondary | ICD-10-CM

## 2015-11-10 DIAGNOSIS — R5382 Chronic fatigue, unspecified: Secondary | ICD-10-CM

## 2015-11-10 DIAGNOSIS — Z5112 Encounter for antineoplastic immunotherapy: Secondary | ICD-10-CM

## 2015-11-10 DIAGNOSIS — C772 Secondary and unspecified malignant neoplasm of intra-abdominal lymph nodes: Secondary | ICD-10-CM

## 2015-11-10 LAB — COMPREHENSIVE METABOLIC PANEL
ALBUMIN: 3.6 g/dL (ref 3.5–5.0)
ALK PHOS: 356 U/L — AB (ref 40–150)
ALT: 90 U/L — AB (ref 0–55)
ANION GAP: 10 meq/L (ref 3–11)
AST: 51 U/L — ABNORMAL HIGH (ref 5–34)
BILIRUBIN TOTAL: 2.31 mg/dL — AB (ref 0.20–1.20)
BUN: 34.4 mg/dL — AB (ref 7.0–26.0)
CALCIUM: 10.1 mg/dL (ref 8.4–10.4)
CO2: 25 mEq/L (ref 22–29)
CREATININE: 1.2 mg/dL (ref 0.7–1.3)
Chloride: 104 mEq/L (ref 98–109)
EGFR: 59 mL/min/{1.73_m2} — ABNORMAL LOW (ref >90)
Glucose: 105 mg/dl (ref 70–140)
Potassium: 4.6 mEq/L (ref 3.5–5.1)
Sodium: 139 mEq/L (ref 136–145)
TOTAL PROTEIN: 7.7 g/dL (ref 6.4–8.3)

## 2015-11-10 LAB — CBC WITH DIFFERENTIAL/PLATELET
BASO%: 0.5 % (ref 0.0–2.0)
Basophils Absolute: 0 10*3/uL (ref 0.0–0.1)
EOS%: 5.4 % (ref 0.0–7.0)
Eosinophils Absolute: 0.3 10*3/uL (ref 0.0–0.5)
HEMATOCRIT: 37.3 % — AB (ref 38.4–49.9)
HEMOGLOBIN: 12.4 g/dL — AB (ref 13.0–17.1)
LYMPH#: 0.8 10*3/uL — AB (ref 0.9–3.3)
LYMPH%: 14.7 % (ref 14.0–49.0)
MCH: 35.7 pg — ABNORMAL HIGH (ref 27.2–33.4)
MCHC: 33.3 g/dL (ref 32.0–36.0)
MCV: 107 fL — ABNORMAL HIGH (ref 79.3–98.0)
MONO#: 0.8 10*3/uL (ref 0.1–0.9)
MONO%: 13.5 % (ref 0.0–14.0)
NEUT%: 65.9 % (ref 39.0–75.0)
NEUTROS ABS: 3.8 10*3/uL (ref 1.5–6.5)
PLATELETS: 132 10*3/uL — AB (ref 140–400)
RBC: 3.49 10*6/uL — ABNORMAL LOW (ref 4.20–5.82)
RDW: 15.8 % — AB (ref 11.0–14.6)
WBC: 5.8 10*3/uL (ref 4.0–10.3)

## 2015-11-10 LAB — MAGNESIUM: MAGNESIUM: 2.2 mg/dL (ref 1.5–2.5)

## 2015-11-10 NOTE — Progress Notes (Signed)
[facility] Telephone:([phone]   Fax:([phone]) [place], MD [address] [place] [address]  DIAGNOSIS AND DIAGNOSIS: Metastatic non-small cell lung cancer adenocarcinoma diagnosed in September 2010 . PDL 1 expression 50%  PRIOR THERAPY:  #1 status post 6 cycles of systemic chemotherapy with carboplatin, Alimta and Avastin given every 3 weeks last dose given 04/23/2009 with disease stabilization.  #2 status post palliative radiotherapy to the mediastinum under the care of Dr. Pablo Ledger. The patient received a total dose of 3000 cGY and 12 fractions completed 01/21/2009.  #3 Maintenance chemotherapy with Alimta at 500 mg per meter squared and Avastin at 15 mg per kilogram given every 3 weeks status post 35 cycles.  #4 Maintenance chemotherapy with Alimta 500 mg/M2 and Avastin 15 mg/kg every 4 weeks,status post 17 cycles, discontinued secondary to disease progression.  #5 Systemic chemotherapy with carboplatin for AUC of 5, Alimta 500 mg/M2 and Avastin 15 mg/kg every 3 weeks, status post 3 cycles, last cycle was given 12/28/2012. Carboplatin was discontinued starting cycle #2 secondary to hypersensitivity reaction. #6 Maintenance chemotherapy again with Alimta 500 mg/M2 and Avastin 15 mg/kg every 3 weeks, first cycle 01/23/2013. Status post 6 cycles. #7 palliative radiotherapy to the large abdominal mass under the care of Dr. Pablo Ledger. #8  Immunotherapy with Nivolumab 3 MG/KG every 2 weeks. First dose 10/04/2014. Status post 3 cycles discontinued secondary to disease progression. #9. Status post palliative radiotherapy to the progressive retroperitoneal lymph nodes under the care of Dr. Sondra Come.  CURRENT THERAPY: Ketruda 200 mg IV every 3 weeks. First dose on 11/20/2015  CODE STATUS: No CODE BLUE  INTERVAL HISTORY: [name] 70 y.o. male returns to the clinic today for follow-up visit accompanied by his  wife. The patient is feeling fine today with no specific complaints except for mild fatigue. He completed a course of palliative radiotherapy to the enlarging retroperitoneal lymph nodes and his abdominal pain has improved since that time. He continues to have occasional nausea and vomiting. He is still dependent on the PEG tube feeding for nutrition. He is expected to have a swallow study soon for evaluation of his condition and to see if the patient can resume his oral intake. He gained few pounds recently. He denied having any significant chest pain, shortness of breath, cough or hemoptysis. He has no fever or chills. The patient is interested in resuming his treatment with immunotherapy. He has PDL 1 test performed recently on the biopsy of the retroperitoneal lymph nodes and PDL 1 expression was 50%. He has negative EGFR, ALK and ROS 1 mutations.  MEDICAL HISTORY: Past Medical History:  Diagnosis Date  . Abnormality of gait 03/12/2015  . Acute meniscal tear of knee RIGHT KNEE  . ARF (acute renal failure) ([place]) 03/2015   pre-renal patient was septic  . Arthritis RIGHT SHOULDER  . BPH (benign prostatic hypertrophy)   . Dizziness and giddiness 04/23/2014  . 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 HOH (hard of hearing)    bilateral hearing aids  . Hypertension   . Immature cataract BILATERAL  . Non-small cell lung cancer ([place]) DX SEPT 2010  W/ CHEMORADIATION AT THAT TIME -- NOW  W/ METS--  CURRENTLY ON MAINTENANCE CHEMO TX  EVERY 94 DAYS   ONCOLOGIST- DR [facility]- lung cancer with metastasis.  . Orthostatic hypotension    B/P tends to run  110/70 or less -using med to raise B/P.  Marland Kitchen Pneumonia   . Radiation 01/08/09-01/21/09   Mediastinum 30 Gy x 12 fractions  . Radiation 08/19/14-09/04/14   Palliative RT Porta hepatic LN 30 Gy  . Sepsis ([place]) 03/2015  . Shortness of breath dyspnea    with exertion  . Swallowing impairment    "Unable to  swallow"  . Tremor 04/23/2014   Right hand    ALLERGIES:  is allergic to fentanyl; augmentin [amoxicillin-pot clavulanate]; levaquin [levofloxacin]; percocet [oxycodone-acetaminophen]; scopolamine; zolpidem tartrate; and carboplatin.  MEDICATIONS:  Current Outpatient Prescriptions  Medication Sig Dispense Refill  . b complex vitamins tablet Take 1 tablet by mouth daily.    Marland Kitchen dicyclomine (BENTYL) 20 MG tablet Take 20 mg by mouth every 6 (six) hours as needed (for abdominal cramps).    . diphenhydramine-acetaminophen (TYLENOL PM) 25-500 MG TABS tablet Take 1 tablet by mouth at bedtime as needed (for sleep).     . FeFum-FePoly-FA-B Cmp-C-Biot (INTEGRA PLUS) CAPS Give 1 capsule by tube daily.    Marland Kitchen HYDROmorphone (DILAUDID) 2 MG tablet Take 2-4 mg by mouth every 4 (four) hours as needed for severe pain.    Marland Kitchen LORazepam (ATIVAN) 1 MG tablet Take 0.5-1 mg by mouth every 4 (four) hours as needed for anxiety (and/or nausea).    . midodrine (PROAMATINE) 5 MG tablet Take 5 mg by mouth 3 (three) times daily with meals.    . Multiple Vitamin (MULTIVITAMIN WITH MINERALS) TABS tablet Take 1 tablet by mouth daily.    . mupirocin ointment (BACTROBAN) 2 % Apply 1 application topically 3 (three) times daily. Pt applies around tube area.  0  . naproxen sodium (ANAPROX) 220 MG tablet Take 220-440 mg by mouth 2 (two) times daily as needed (for pain).     . Nutritional Supplements (FEEDING SUPPLEMENT, OSMOLITE 1.5 CAL,) LIQD Place 237 mLs into feeding tube 3 (three) times daily.     . Nutritional Supplements (TWOCAL HN) LIQD Place 237 mLs into feeding tube 3 (three) times daily.    Marland Kitchen omeprazole (PRILOSEC) 40 MG capsule Take 40 mg by mouth at bedtime.     . ondansetron (ZOFRAN) 8 MG tablet Take 1 tablet (8 mg total) by mouth every 8 (eight) hours as needed for nausea or vomiting. 30 tablet 0  . promethazine (PHENERGAN) 25 MG tablet Take 25 mg by mouth every 6 (six) hours as needed for nausea or vomiting.     No  current facility-administered medications for this visit.     SURGICAL HISTORY:  Past Surgical History:  Procedure Laterality Date  . BILATERAL EAR DRUM SURGERY  1960'S  . BILE DUCT STENT PLACEMENT    . CATARACT EXTRACTION Bilateral   . ERCP N/A 08/09/2014   Procedure: ENDOSCOPIC RETROGRADE CHOLANGIOPANCREATOGRAPHY (ERCP);  Surgeon: Carol Ada, MD;  Location: [facility] END[facility];  Service: Endoscopy;  Laterality: N/A;  . ERCP N/A 05/23/2015   Procedure: ENDOSCOPIC RETROGRADE CHOLANGIOPANCREATOGRAPHY (ERCP);  Surgeon: Carol Ada, MD;  [facility] END[facility]COPY;  Service: Endoscopy;  Laterality: N/A;  . ERCP N/A 05/30/2015   Procedure: ENDOSCOPIC RETROGRADE CHOLANGIOPANCREATOGRAPHY (ERCP);  Surgeon: Carol Ada, MD;  Location: [facility] ENDOSCOPY;  Service: Endoscopy;  Laterality: N/A;  . ESOPHAGOGASTRODUODENOSCOPY (EGD) WITH PROPOFOL N/A 08/09/2014   Procedure: ESOPHAGOGASTRODUODENOSCOPY (EGD) WITH PROPOFOL;  Surgeon: Carol Ada, MD;  Location: [facility] ENDOSCOPY;  Service: Endoscopy;  Laterality: N/A;  . EUS N/A 08/09/2014   Procedure: UPPER ENDOSCOPIC ULTRASOUND (EUS) RADIAL;  Surgeon: Carol Ada, MD;  Location: [facility] ENDOSCOPY;  Service: Endoscopy;  Laterality: N/A;  . FINE NEEDLE ASPIRATION N/A 08/09/2014   Procedure: FINE NEEDLE ASPIRATION (FNA) LINEAR;  Surgeon: Carol Ada, MD;  Location: [facility] ENDOSCOPY;  Service: Endoscopy;  Laterality: N/A;  . GASTROSTOMY TUBE PLACEMENT  10/2014  . KNEE ARTHROSCOPY  12/21/2011   Procedure: ARTHROSCOPY KNEE;  Surgeon: Tobi Bastos, MD;  Location: [facility];  Service: Orthopedics;  Laterality: Right;  WITH MEDIAL MENISECTOMY  . LARYNGOSCOPY  01/2015 and 04/21/15  . ORIF LEFT RING FINGER  AND REVASCULARIZATION OF RADIAL SIDE  02-16-2007   DEGLOVING INJURY  . [place] cath    . PORTACATH PLACEMENT  Removed 06/20/3015   as of 05-27-15 remains right chest  . ROTATOR CUFF REPAIR  2008   LEFT SHOULDER  . SHOULDER OPEN ROTATOR CUFF REPAIR Right 06/05/2014    Procedure: RIGHT ROTATOR CUFF REPAIR SHOULDER OPEN;  Surgeon: Latanya Maudlin, MD;  Location: [facility] ORS;  Service: Orthopedics;  Laterality: Right;  . TONSILLECTOMY     as child    REVIEW OF SYSTEMS:  Constitutional: positive for fatigue Eyes: negative Ears, nose, mouth, throat, and face: negative Respiratory: negative Cardiovascular: negative Gastrointestinal: positive for dysphagia and nausea Genitourinary:negative Integument/breast: negative Hematologic/lymphatic: negative Musculoskeletal:negative Neurological: negative Behavioral/Psych: negative Endocrine: negative Allergic/Immunologic: negative   PHYSICAL EXAMINATION: General appearance: alert, cooperative, cachectic, fatigued and no distress Head: Normocephalic, without obvious abnormality, atraumatic Neck: no adenopathy, no JVD, supple, symmetrical, trachea midline and thyroid not enlarged, symmetric, no tenderness/mass/nodules Lymph nodes: Cervical, supraclavicular, and axillary nodes normal. Resp: clear to auscultation bilaterally Back: symmetric, no curvature. ROM normal. No CVA tenderness. Cardio: regular rate and rhythm, S1, S2 normal, no murmur, click, rub or gallop GI: soft, non-tender; bowel sounds normal; no masses,  no organomegaly Extremities: extremities normal, atraumatic, no cyanosis or edema Neurologic: Alert and oriented X 3, normal strength and tone. Normal symmetric reflexes. Normal coordination and gait  ECOG PERFORMANCE STATUS: 1 - Symptomatic but completely ambulatory  Blood pressure 121/81, pulse 90, temperature 97.2 F (36.2 C), resp. rate 18, height _0  (1.727 m), weight 130 lb 4.8 oz (59.1 kg), SpO2 100 %.  LABORATORY DATA: Lab Results  Component Value Date   WBC 9.0 10/26/2015   HGB 10.9 (L) 10/26/2015   HCT 32.3 (L) 10/26/2015   MCV 105.2 (H) 10/26/2015   PLT 136 (L) 10/26/2015      Chemistry      Component Value Date/Time   NA 135 10/26/2015 1715   NA 135 (L) 08/19/2015 1236   K 4.3  10/26/2015 1715   K 4.9 08/19/2015 1236   CL 104 10/26/2015 1715   CL 109 (H) 08/29/2012 0856   CO2 21 (L) 10/26/2015 1715   CO2 21 (L) 08/19/2015 1236   BUN 34 (H) 10/26/2015 1715   BUN 37.5 (H) 08/19/2015 1236   CREATININE 1.19 10/26/2015 1715   CREATININE 1.3 08/19/2015 1236      Component Value Date/Time   CALCIUM 9.4 10/26/2015 1715   CALCIUM 10.3 08/19/2015 1236   ALKPHOS 317 (H) 10/26/2015 1848   ALKPHOS 529 (H) 08/19/2015 1236   AST 48 (H) 10/26/2015 1848   AST 86 (H) 08/19/2015 1236   ALT 72 (H) 10/26/2015 1848   ALT 137 (H) 08/19/2015 1236   BILITOT 2.1 (H) 10/26/2015 1848   BILITOT 3.72 (HH) 08/19/2015 1236       RADIOGRAPHIC STUDIES: Dg Chest 2 View  Result Date: 10/21/2015 CLINICAL DATA:  Weakness.  History of previous lung carcinoma EXAM: CHEST  2 VIEW COMPARISON:  Chest radiograph April 23, 2015 and chest CT Aug 19, 2015 FINDINGS: There is scarring in the medial right apex and right paratracheal regions, likely due to post radiation therapy change. There is mild scarring in the right lower lobe region. There is no edema or consolidation. The heart size and pulmonary vascularity are normal. No adenopathy evident. There is atherosclerotic calcification in the aorta. There is evidence of old trauma in the acromioclavicular joint with widening in this area and calcification. There is also calcification superior to the left humeral head. There is evidence of old healed trauma in the lateral right clavicle. IMPRESSION: Scarring right paratracheal and medial right apex regions, likely due to post radiation therapy change. No edema or consolidation. No mass or adenopathy. Aortic atherosclerosis. Areas of prior bony trauma as noted above. Electronically Signed   By: Lowella Grip III M.D.   On: 10/21/2015 10:36   ASSESSMENT AND PLAN: This is a very pleasant 70 years old white male with a stage IV non-small cell lung cancer diagnosed in September 2010 status post several  chemotherapy regimens including immunotherapy which was discontinued recently secondary to disease progression. Over the last few months he patient has significant improvement in his condition. He requested reevaluation for treatment. The recent CT scan of the chest, abdomen and pelvis showed response to therapy of the right paratracheal adenopathy but there was progression of the retroperitoneal metastatic disease. The final pathology from the CT-guided core biopsy of the enlarging retroperitoneal lymphadenopathy was consistent with metastatic poorly differentiated carcinoma. Molecular studies were negative for EGFR, ALK and ROS 1. PDL 1 expression is 50%.   The patient is interested in resuming treatment with immunotherapy. Since the PDL 1 expression is 50%, I discussed with the patient treatment with Ketruda (pembrolizumab) 200 mg IV every 3 weeks. I discussed with the patient his wife the adverse effect of this treatment including but not limited to immune mediated skin rash, diarrhea, pneumonitis, kidney, liver, thyroid or other endocrine dysfunction. He would like to proceed with the treatment as planned and expected to start the first dose of this treatment on 11/20/2015. He would come back for follow-up visit in 4 weeks with the start of cycle #2. He was advised to call immediately if he has any concerning symptoms. The patient voices understanding of current disease status and treatment options and is in agreement with the current care plan.  All questions were answered. The patient knows to call the clinic with any problems, questions or concerns. We can certainly see the patient much sooner if necessary.  Disclaimer: This note was dictated with voice recognition software. Similar sounding words can inadvertently be transcribed and may not be corrected upon review.

## 2015-11-10 NOTE — Telephone Encounter (Signed)
GAVE WIFE AVS REPORT/CALENDAR FOR [facility] October.

## 2015-11-11 ENCOUNTER — Ambulatory Visit: Payer: Medicare Other | Admitting: Speech Pathology

## 2015-11-11 DIAGNOSIS — R1312 Dysphagia, oropharyngeal phase: Secondary | ICD-10-CM

## 2015-11-11 LAB — PHOSPHORUS: PHOSPHORUS: 3.3 mg/dL (ref 2.5–4.5)

## 2015-11-11 NOTE — Therapy (Signed)
[facility] [address] [place], [place], [address] Phone: [phone]   Fax:  [phone]  Speech Language Pathology Treatment  Patient Details  Name: [name] MRN: [id number] Date of Birth: Nov 25, 1945 Referring Provider: Dr. [name]  Encounter Date: 11/11/2015      End of Session - 11/11/15 1204    Visit Number 6   Number of Visits 17   Date for SLP Re-Evaluation 11/20/15   SLP Start Time 0934   SLP Stop Time  6606   SLP Time Calculation (min) 40 min      Past Medical History:  Diagnosis Date  . Abnormality of gait 03/12/2015  . Acute meniscal tear of knee RIGHT KNEE  . ARF (acute renal failure) ([place]) 03/2015   pre-renal patient was septic  . Arthritis RIGHT SHOULDER  . BPH (benign prostatic hypertrophy)   . Dizziness and giddiness 04/23/2014  . 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 HOH (hard of hearing)    bilateral hearing aids  . Hypertension   . Immature cataract BILATERAL  . Non-small cell lung cancer ([place]) DX SEPT 2010  W/ CHEMORADIATION AT THAT TIME -- NOW  W/ METS--  CURRENTLY ON MAINTENANCE CHEMO TX  EVERY 13 DAYS   ONCOLOGIST- [facility]- lung cancer with metastasis.  . Orthostatic hypotension    B/P tends to run 110/70 or less -using med to raise B/P.  Marland Kitchen Pneumonia   . Radiation 01/08/09-01/21/09   Mediastinum 30 Gy x 12 fractions  . Radiation 08/19/14-09/04/14   Palliative RT Porta hepatic LN 30 Gy  . Sepsis ([place]) 03/2015  . Shortness of breath dyspnea    with exertion  . Swallowing impairment    "Unable to swallow"  . Tremor 04/23/2014   Right hand    Past Surgical History:  Procedure Laterality Date  . BILATERAL EAR DRUM SURGERY  1960'S  . BILE DUCT STENT PLACEMENT    . CATARACT EXTRACTION Bilateral   . ERCP N/A 08/09/2014   Procedure: ENDOSCOPIC RETROGRADE CHOLANGIOPANCREATOGRAPHY (ERCP);  Surgeon: Carol Ada,  MD;  Location: [facility];  Service: Endoscopy;  Laterality: N/A;  . ERCP N/A 05/23/2015   Procedure: ENDOSCOPIC RETROGRADE CHOLANGIOPANCREATOGRAPHY (ERCP);  Surgeon: Carol Ada, MD;  Location: [facility] ENDOSCOPY;  Service: Endoscopy;  Laterality: N/A;  . ERCP N/A 05/30/2015   Procedure: ENDOSCOPIC RETROGRADE CHOLANGIOPANCREATOGRAPHY (ERCP);  Surgeon: Carol Ada, MD;  Location: [facility];  Service: Endoscopy;  Laterality: N/A;  . ESOPHAGOGASTRODUODENOSCOPY (EGD) WITH PROPOFOL N/A 08/09/2014   Procedure: ESOPHAGOGASTRODUODENOSCOPY (EGD) WITH PROPOFOL;  Surgeon: Carol Ada, MD;  Location: [facility] ENDOSCOPY;  Service: Endoscopy;  Laterality: N/A;  . EUS N/A 08/09/2014   Procedure: UPPER ENDOSCOPIC ULTRASOUND (EUS) RADIAL;  Surgeon: Carol Ada, MD;  Location: [facility] ENDOSCOPY;  Service: Endoscopy;  Laterality: N/A;  . FINE NEEDLE ASPIRATION N/A 08/09/2014   Procedure: FINE NEEDLE ASPIRATION (FNA) LINEAR;  Surgeon: Carol Ada, MD;  Location: [facility]OS[facility];  Service: Endoscopy;  Laterality: N/A;  . GASTROSTOMY TUBE PLACEMENT  10/2014  . KNEE ARTHROSCOPY  12/21/2011   Procedure: ARTHROSCOPY KNEE;  Surgeon: Tobi Bastos, MD;  Location: [facility];  Service: Orthopedics;  Laterality: Right;  WITH MEDIAL MENISECTOMY  . LARYNGOSCOPY  01/2015 and 04/21/15  . ORIF LEFT RING FINGER  AND REVASCULARIZATION OF RADIAL SIDE  02-16-2007   DEGLOVING INJURY  . [place] cath    . PORTACATH PLACEMENT  Removed 06/20/3015   as of  05-27-15 remains right chest  . ROTATOR CUFF REPAIR  2008   LEFT SHOULDER  . SHOULDER OPEN ROTATOR CUFF REPAIR Right 06/05/2014   Procedure: RIGHT ROTATOR CUFF REPAIR SHOULDER OPEN;  Surgeon: Latanya Maudlin, MD;  Location: [facility];  Service: Orthopedics;  Laterality: Right;  . TONSILLECTOMY     as child    There were no vitals filed for this visit.             ADULT SLP TREATMENT - 11/11/15 0953      General Information   Behavior/Cognition Alert;Cooperative;Pleasant mood      Treatment Provided   Treatment provided Dysphagia     Dysphagia Treatment   Treatment Methods Therapeutic exercise;Skilled observation   Patient observed directly with PO's Yes   Type of PO's observed Thin liquids;Dysphagia 1 (puree);Ice chips   Other treatment/comments Pt reports feeling stronger - he walked with rolling walker instead of wheelchair into therapy. PO trials of 2 1/2 tsp of applesauce to assess readiness for repeat objective swallow study resulted in 4-5 suequential swallows indicating likely pharyngeal residue. Pt reported "it didn't go down."  I added 2 exercises to HEP - modified Shaker with towel due to reduced mobility and trained pt on taking 2-3 ice chips, chewing and swallowing with hard swallow after oral care. He performed these with supervision cues.            SLP Short Term Goals - 11/11/15 1203      SLP SHORT TERM GOAL #1   Title Pt will perform dysphagia HEP with occasional min A   Status Achieved     SLP SHORT TERM GOAL #2   Title Pt will demonstrate/verbalize precautions for free water protocol with occasional min A   Time 2   Period Weeks   Status Achieved          SLP Long Term Goals - 11/11/15 1203      SLP LONG TERM GOAL #1   Title Pt will perform swallow HEP with rare min A over 4 sessions   Baseline 10-27-15, 11/04/15, 11/11/15   Time 4   Period Weeks   Status Revised     SLP LONG TERM GOAL #2   Title Pt will demonstrate clinical gains/readiness for repeat objective swallowing assessment prior to initiation of PO   Time 4   Period Weeks   Status On-going          Plan - 11/11/15 1200    Clinical Impression Statement clinical assessment of readiness for  MBSS with 1/2 tsp boluses of applesauce revealed overt s/s of pharyngeal residue c/w prior MBSS - plan to re-assess this next visit next week - pt instructed to practice hard swallows with ice chips at home. Conitnue skilled ST to for readiness for MBSS and initiation of PO's.    Speech Therapy Frequency 1x /week   Treatment/Interventions SLP instruction and feedback;Internal/external aids;Compensatory strategies;Pharyngeal strengthening exercises;Oral motor exercises;Trials of upgraded texture/liquids;Patient/family education;Diet toleration management by SLP;Aspiration precaution training   Potential to Achieve Goals Fair   Potential Considerations Severity of impairments;Co-morbidities   Consulted and Agree with Plan of Care Patient;Family member/caregiver   Family Member Consulted spouse      Patient will benefit from skilled therapeutic intervention in order to improve the following deficits and impairments:   Dysphagia, oropharyngeal phase    Problem List Patient Active Problem List   Diagnosis Date Noted  . Chronic fatigue 11/10/2015  . Bacteremia due to Klebsiella pneumoniae 04/29/2015  .  Infection due to Enterobacter cloacae 04/29/2015  . Hypokalemia 04/29/2015  . Gram-negative bacteremia ([place]) 04/25/2015  . Septic shock ([place]) 04/23/2015  . Malignant neoplasm of lung ([place])   . Sepsis ([place])   . Acute kidney injury ([place])   . Abnormality of gait 03/12/2015  . Lung cancer ([place])   . Protein-calorie malnutrition, severe ([place]) 11/26/2014  . Hypoalbuminemia 11/18/2014  . Leukocytosis 11/18/2014  . Dysphagia 11/18/2014  . Nausea with vomiting 10/03/2014  . Central line complication 90/30/0923  . Anorexia 10/03/2014  . Weight loss 10/03/2014  . Transaminitis 10/03/2014  . Hyperphosphatemia 10/03/2014  . Encounter for antineoplastic immunotherapy 09/28/2014  . Non-small cell lung cancer ([place]) 09/21/2014  . Abdominal pain   . Fever   . 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 Biliary obstruction   . Fatigue 08/27/2014  . Abdominal pain, acute   . Abdominal mass   . Abdominal pain, generalized 08/07/2014  . CRF (chronic renal failure) 08/07/2014  . Obstructive jaundice 08/07/2014  . Malnutrition of moderate degree  ([place]) 08/07/2014  . Obstructive jaundice due to cancer 08/06/2014  . Rotator cuff tear, non-traumatic 06/05/2014  . Dizziness and giddiness 04/23/2014  . Tremor 04/23/2014  . Acute on chronic renal failure ([place]) 05/24/2013  . Pancytopenia ([place]) 05/24/2013  . Osteoarthritis of right knee 12/21/2011  . Meniscus, medial, posterior horn derangement 12/21/2011  . Dyspnea 12/04/2010  . Bronchogenic cancer of left lung ([place]) 12/11/2008  . ARTHRITIS 12/10/2008    Lovvorn, Annye Rusk MS, CCC-SLP 11/11/2015, 12:05 PM  [facility] [address] [place], [place], [address] Phone: [phone]   Fax:  [phone]   Name: Ali Mohl MRN: [id number] Date of Birth: 1946/02/28

## 2015-11-17 ENCOUNTER — Other Ambulatory Visit (HOSPITAL_COMMUNITY): Payer: Self-pay | Admitting: Interventional Radiology

## 2015-11-17 DIAGNOSIS — R633 Feeding difficulties, unspecified: Secondary | ICD-10-CM

## 2015-11-18 ENCOUNTER — Ambulatory Visit: Payer: Medicare Other

## 2015-11-18 DIAGNOSIS — R1312 Dysphagia, oropharyngeal phase: Secondary | ICD-10-CM | POA: Diagnosis not present

## 2015-11-18 DIAGNOSIS — R2689 Other abnormalities of gait and mobility: Secondary | ICD-10-CM

## 2015-11-18 DIAGNOSIS — R42 Dizziness and giddiness: Secondary | ICD-10-CM

## 2015-11-18 DIAGNOSIS — M6281 Muscle weakness (generalized): Secondary | ICD-10-CM

## 2015-11-18 NOTE — Therapy (Signed)
[facility] [address] [place], [place], [address] Phone: [phone]   Fax:  [phone]  Physical Therapy Evaluation  Patient Details  Name: [name] MRN: [id number] Date of Birth: [birth year] Referring Provider: Dr. [name]  Encounter Date: 11/18/2015      PT End of Session - 11/18/15 1659    Visit Number 1   Number of Visits 9   Date for PT Re-Evaluation 01/17/16   Authorization Type Medicare - GCodes and progress notes required   PT Start Time 1101   PT Stop Time 1149   PT Time Calculation (min) 48 min   Equipment Utilized During Treatment --  S for safety   Activity Tolerance Patient tolerated treatment well   Behavior During Therapy [facility] for tasks assessed/performed      Past Medical History:  Diagnosis Date  . Abnormality of gait 03/12/2015  . Acute meniscal tear of knee RIGHT KNEE  . ARF (acute renal failure) ([place]) 03/2015   pre-renal patient was septic  . Arthritis RIGHT SHOULDER  . BPH (benign prostatic hypertrophy)   . Dizziness and giddiness 04/23/2014  . 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 HOH (hard of hearing)    bilateral hearing aids  . Hypertension   . Immature cataract BILATERAL  . Non-small cell lung cancer ([place]) DX SEPT 2010  W/ CHEMORADIATION AT THAT TIME -- NOW  W/ METS--  CURRENTLY ON MAINTENANCE CHEMO TX  EVERY 18 DAYS   ONCOLOGIST- DR [facility]- lung cancer with metastasis.  . Orthostatic hypotension    B/P tends to run 110/70 or less -using med to raise B/P.  Marland Kitchen Pneumonia   . Radiation 01/08/09-01/21/09   Mediastinum 30 Gy x 12 fractions  . Radiation 08/19/14-09/04/14   Palliative RT Porta hepatic LN 30 Gy  . Sepsis ([place]) 03/2015  . Shortness of breath dyspnea    with exertion  . Swallowing impairment    "Unable to swallow"  . Tremor 04/23/2014   Right hand    Past Surgical History:  Procedure  Laterality Date  . BILATERAL EAR DRUM SURGERY  1960'S  . BILE DUCT STENT PLACEMENT    . CATARACT EXTRACTION Bilateral   . ERCP N/A 08/09/2014   Procedure: ENDOSCOPIC RETROGRADE CHOLANGIOPANCREATOGRAPHY (ERCP);  Surgeon: Carol Ada, MD;  Location: [facility]OS[facility];  Service: Endoscopy;  Laterality: N/A;  . ERCP N/A 05/23/2015   Procedure: ENDOSCOPIC RETROGRADE CHOLANGIOPANCREATOGRAPHY (ERCP);  Surgeon: Carol Ada, MD;  Location: [facility] ENDOSCOPY;  Service: Endoscopy;  Laterality: N/A;  . ERCP N/A 05/30/2015   Procedure: ENDOSCOPIC RETROGRADE CHOLANGIOPANCREATOGRAPHY (ERCP);  Surgeon: Carol Ada, MD;  Location: [facility] ENDOSCOPY;  Service: Endoscopy;  Laterality: N/A;  . ESOPHAGOGASTRODUODENOSCOPY (EGD) WITH PROPOFOL N/A 08/09/2014   Procedure: ESOPHAGOGASTRODUODENOSCOPY (EGD) WITH PROPOFOL;  Surgeon: Carol Ada, MD;  Location: [facility] ENDOSCOPY;  Service: Endoscopy;  Laterality: N/A;  . EUS N/A 08/09/2014   Procedure: UPPER ENDOSCOPIC ULTRASOUND (EUS) RADIAL;  Surgeon: Carol Ada, MD;  Location: [facility] ENDOSCOPY;  Service: Endoscopy;  Laterality: N/A;  . FINE NEEDLE ASPIRATION N/A 08/09/2014   Procedure: FINE NEEDLE ASPIRATION (FNA) LINEAR;  Surgeon: Carol Ada, MD;  Location: [facility] ENDOSCOPY;  Service: Endoscopy;  Laterality: N/A;  . GASTROSTOMY TUBE PLACEMENT  10/2014  . KNEE ARTHROSCOPY  12/21/2011   Procedure: ARTHROSCOPY KNEE;  Surgeon: Tobi Bastos, MD;  Location: [facility];  Service: Orthopedics;  Laterality: Right;  WITH MEDIAL MENISECTOMY  . LARYNGOSCOPY  01/2015 and 04/21/15  . ORIF LEFT RING FINGER  AND REVASCULARIZATION OF RADIAL SIDE  02-16-2007   DEGLOVING INJURY  . [place] cath    . PORTACATH PLACEMENT  Removed 06/20/3015   as of 05-27-15 remains right chest  . ROTATOR CUFF REPAIR  2008   LEFT SHOULDER  . SHOULDER OPEN ROTATOR CUFF REPAIR Right 06/05/2014   Procedure: RIGHT ROTATOR CUFF REPAIR SHOULDER OPEN;  Surgeon: Latanya Maudlin, MD;  Location: [facility] ORS;  Service: Orthopedics;   Laterality: Right;  . TONSILLECTOMY     as child    There were no vitals filed for this visit.       Subjective Assessment - 11/18/15 1113    Subjective Pt reports he still has the vestibular/orthostatis problems and [facility] MD told pt he needs to get rehab to correct gait and balance. Pt reports he feels out of balance more when moving side to side vs. front/back. He also fatigues quickly after walking and he wants to get stronger. Pt has gained weight (up to 130lbs.) and feels a little better, he is currenlty on radiation and starts another CA drug tomorrow. Pt's BP has been better controlled. Pt currenltly seeing speech therapist to work on swallowing in order to reduce use of PEG tube.  Pt reported occasional difficulty traversing steps or amb. 2/2 L knee meniscus repair and feels like it could occasionally give out. Once, pt is stronger, he'd like to trial treadmill to use during winter season.    Patient is accompained by: Family member  [name]   Pertinent History metastatic lung cancer, orthostatis, dysphagia, hx of dizziness, R knee OA, hx of L knee menisus tear (per pt), CRF   Patient Stated Goals Get my overall strength built up   Currently in Pain? No/denies            [facility] PT Assessment - 11/18/15 1117      Assessment   Medical Diagnosis Deconditioning   Referring Provider Dr. [name]   Onset Date/Surgical Date 09/27/14   Prior Therapy [facility]     Precautions   Precautions Fall   Precaution Comments based on TUG time     Restrictions   Weight Bearing Restrictions No     Balance Screen   Has the patient fallen in the past 6 months No   Has the patient had a decrease in activity level because of a fear of falling?  No   Is the patient reluctant to leave their home because of a fear of falling?  No     Home Ecologist residence   Living Arrangements Spouse/significant other   Available Help at Discharge Family   Type of  [place] to enter   Entrance Stairs-Number of Steps 4-5   [place] One level   [place] - 4 wheels;Wheelchair - [employer] - 2 wheels;Shower seat  and treadmill     Prior Function   Level of Independence Requires assistive device for independence   Vocation Retired   Leisure play golf again     [employer]   Overall Cognitive Status Impaired/Different from baseline   Memory Impaired   Memory Impairment Decreased short term memory     Sensation   Light Touch Impaired Detail   Light Touch Impaired Details Impaired LUE  decr. L hand light touch   Additional Comments Pt denied N/T but reported R UE tremors (has hx  of tremors).     Posture/Postural Control   Posture/Postural Control Postural limitations   Postural Limitations Rounded Shoulders;Forward head     ROM / Strength   AROM / PROM / Strength AROM;Strength     AROM   Overall AROM  Within functional limits for tasks performed   Overall AROM Comments B UE/LE WFL.     Strength   Overall Strength Deficits   Overall Strength Comments RLE: hip flex: 4/5, knee ext: 4/5, knee flex: 4-/5, ankle DF: 3+/5. LLE: hip flex: 3+/5, knee ext: 4-/5, knee flex: 3+/5, ankle DF: 3+/5. B hip abd/add in seated: 3+/5. Pt reported L knee is affected by meniscus repair.  B hip ext not formally tested but weakness suspected 2/2 gait deviations.    Strength Assessment Site --     Transfers   Transfers Sit to Stand;Stand to Sit   Sit to Stand 5: Supervision;With upper extremity assist;From chair/3-in-1   Sit to Stand Details Verbal cues for safe use of DME/AE   Sit to Stand Details (indicate cue type and reason) cues to lock rollator brakes   Stand to Sit 6: Modified independent (Device/Increase time);With upper extremity assist;To chair/3-in-1     Ambulation/Gait   Ambulation/Gait Yes   Ambulation/Gait Assistance 5: Supervision   Ambulation/Gait Assistance Details Cues to  stay within rollator and to improve upright posture and heel strike.   Ambulation Distance (Feet) 798 Feet  100   Assistive device Rollator   Gait Pattern Step-through pattern;Decreased stride length;Decreased trunk rotation;Decreased dorsiflexion - left;Decreased dorsiflexion - right   Ambulation Surface Level;Indoor   Gait velocity 2.43f/sec.  with rollator     6 Minute Walk- Baseline   6 Minute Walk- Baseline yes   BP (mmHg) 117/77   HR (bpm) 78   02 Sat (%RA) 98 %   Perceived Rate of Exertion ([place]) 6-     6 Minute walk- Post Test   6 Minute Walk Post Test yes   BP (mmHg) 122/78   HR (bpm) 84   02 Sat (%RA) 99 %   Perceived Rate of Exertion ([place]) 9- very light     6 minute walk test results    Aerobic Endurance Distance Walked 798  feet and 243.239mers.     Standardized Balance Assessment   Standardized Balance Assessment Timed Up and Go Test     Timed Up and Go Test   TUG Normal TUG   Normal TUG (seconds) 20.6  with rollator                           PT Education - 11/18/15 1659    Education provided Yes   Education Details PT discussed frequency/duration and outcome measures. PT discussed setting up a walking program next session to improve endurance.   Person(s) Educated Patient;Spouse   Methods Explanation   Comprehension Verbalized understanding          PT Short Term Goals - 11/18/15 1709      PT SHORT TERM GOAL #1   Title Pt will be IND in HEP to improve strength, endurance, and balance. TARGET DATE FOR ALL STGS: 12/16/15   Status New     PT SHORT TERM GOAL #2   Title Perform DGI and write goals prn.    Status New     PT SHORT TERM GOAL #3   Title Pt will amb. 200' with SPC and S to improve functional mobility, over even terrain.  Status New     PT SHORT TERM GOAL #4   Title Pt will perform TUG with LRAD in </=13.5sec. to decr. falls risk.            PT Long Term Goals - 11/18/15 1710      PT LONG TERM GOAL #1    Title Pt will improve 6MWT distance from 798' to 998' to improve endurance. TARGET DATE FOR ALL LTGS: 01/13/16   Status New     PT LONG TERM GOAL #2   Title Assess ability to amb. on treadmill and write goal if applicable and safe.   Status New     PT LONG TERM GOAL #3   Title Pt will amb. 100' without AD, with S, in order to amb. safely at home.    Status New     PT LONG TERM GOAL #4   Title Pt will amb. 400' over even/paved surfaces with LRAD at MOD I level in order to improve functional mobility.    Status New     PT LONG TERM GOAL #5   Title Assess stair climbing ability and write goals prn.    Status New               Plan - 11/18/15 1700    Clinical Impression Statement Pt is a pleasant 70y/o male presenting to [facility] for deconditioning, as pt currently undergoing treatment for metastatic lung CA. Pt is familiar to clinic, as he was previously at [facility] neuro for dizziness and debility. Pt's PMH significant for the following deficits: metastatic lung cancer, orthostatis, dysphagia, hx of dizziness, R knee OA, hx of L knee menisus tear (per pt), CRF. During pt exam the following deficits were found: gait deviations, impaired balance, decreased endurance, no dizziness during exam but pt with hx of dizziness, general weakness and impaired posture. Pt's TUG time indicates pt is at risk for falls. Pt's gait speed indicates he is not able to walk safely in the community. Pt's 6MWT test distance is below normal values for his age group (76-79, 546mis the norm and pt amb. 243.29m PT will perform BERG next session.   Rehab Potential Good   Clinical Impairments Affecting Rehab Potential Pt has metastatic lung cancer and is currently receiving treatment   PT Frequency 1x / week   PT Duration 8 weeks   PT Treatment/Interventions ADLs/Self Care Home Management;Canalith Repostioning;DME Instruction;Gait training;Stair training;Functional mobility training;Therapeutic  activities;Therapeutic exercise;Balance training;Vestibular;Neuromuscular re-education;Patient/family education   PT Next Visit Plan Perform DGI and write goal, initiate walking program (6 minutes starting point) and then initiate strength/balance HEP.   Consulted and Agree with Plan of Care Patient;Family member/caregiver   Family Member Consulted wife, ShFreda Munro    Patient will benefit from skilled therapeutic intervention in order to improve the following deficits and impairments:  Dizziness, Abnormal gait, Decreased mobility, Decreased endurance, Decreased balance, Postural dysfunction, Decreased strength  Visit Diagnosis: Other abnormalities of gait and mobility - Plan: PT plan of care cert/re-cert  Muscle weakness (generalized) - Plan: PT plan of care cert/re-cert  Dizziness and giddiness - Plan: PT plan of care cert/re-cert      G-Codes - 0811/91/47714    Functional Assessment Tool Used 6MWT: 798'; TUG without AD: 20.6 sec.   Functional Limitation Mobility: Walking and moving around   Mobility: Walking and Moving Around Current Status ([phone] least 60 percent but less than 80 percent impaired, limited or restricted   Mobility: Walking and Moving  Around Goal Status [phone]) At least 20 percent but less than 40 percent impaired, limited or restricted       Problem List Patient Active Problem List   Diagnosis Date Noted  . Chronic fatigue 11/10/2015  . Bacteremia due to Klebsiella pneumoniae 04/29/2015  . Infection due to Enterobacter cloacae 04/29/2015  . Hypokalemia 04/29/2015  . Gram-negative bacteremia ([place]) 04/25/2015  . Septic shock ([place]) 04/23/2015  . Malignant neoplasm of lung ([place])   . Sepsis ([place])   . Acute kidney injury ([place])   . Abnormality of gait 03/12/2015  . Lung cancer ([place])   . Protein-calorie malnutrition, severe ([place]) 11/26/2014  . Hypoalbuminemia 11/18/2014  . Leukocytosis 11/18/2014  . Dysphagia 11/18/2014  . Nausea with vomiting 10/03/2014  .  Central line complication 60/45/4098  . Anorexia 10/03/2014  . Weight loss 10/03/2014  . Transaminitis 10/03/2014  . Hyperphosphatemia 10/03/2014  . Encounter for antineoplastic immunotherapy 09/28/2014  . Non-small cell lung cancer ([place]) 09/21/2014  . Abdominal pain   . Fever   . 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 Biliary obstruction   . Fatigue 08/27/2014  . Abdominal pain, acute   . Abdominal mass   . Abdominal pain, generalized 08/07/2014  . CRF (chronic renal failure) 08/07/2014  . Obstructive jaundice 08/07/2014  . Malnutrition of moderate degree ([place]) 08/07/2014  . Obstructive jaundice due to cancer 08/06/2014  . Rotator cuff tear, non-traumatic 06/05/2014  . Dizziness and giddiness 04/23/2014  . Tremor 04/23/2014  . Acute on chronic renal failure ([place]) 05/24/2013  . Pancytopenia ([place]) 05/24/2013  . Osteoarthritis of right knee 12/21/2011  . Meniscus, medial, posterior horn derangement 12/21/2011  . Dyspnea 12/04/2010  . Bronchogenic cancer of left lung ([place]) 12/11/2008  . ARTHRITIS 12/10/2008    Ailah Barna L 11/18/2015, 5:19 PM  [facility] [address] [place], [place], [address] Phone: [phone]   Fax:  [phone]  Name: [name] MRN: [id number] Date of Birth: [birth year]  Geoffry Paradise, PT,DPT 11/18/15 5:19 PM Phone: [phone] Fax: [phone]

## 2015-11-19 ENCOUNTER — Ambulatory Visit (HOSPITAL_BASED_OUTPATIENT_CLINIC_OR_DEPARTMENT_OTHER): Payer: Medicare Other

## 2015-11-19 ENCOUNTER — Other Ambulatory Visit (HOSPITAL_BASED_OUTPATIENT_CLINIC_OR_DEPARTMENT_OTHER): Payer: Medicare Other

## 2015-11-19 ENCOUNTER — Ambulatory Visit: Payer: Medicare Other

## 2015-11-19 ENCOUNTER — Ambulatory Visit (HOSPITAL_COMMUNITY)
Admission: RE | Admit: 2015-11-19 | Discharge: 2015-11-19 | Disposition: A | Discharge status: Home / Self Care | Payer: Medicare Other | Source: Ambulatory Visit | Attending: Interventional Radiology | Admitting: Interventional Radiology

## 2015-11-19 VITALS — BP 130/71 | HR 82 | Temp 97.6°F | Resp 16

## 2015-11-19 DIAGNOSIS — C786 Secondary malignant neoplasm of retroperitoneum and peritoneum: Secondary | ICD-10-CM | POA: Diagnosis not present

## 2015-11-19 DIAGNOSIS — Z5112 Encounter for antineoplastic immunotherapy: Secondary | ICD-10-CM

## 2015-11-19 DIAGNOSIS — Z79899 Other long term (current) drug therapy: Secondary | ICD-10-CM | POA: Diagnosis not present

## 2015-11-19 DIAGNOSIS — R5382 Chronic fatigue, unspecified: Secondary | ICD-10-CM

## 2015-11-19 DIAGNOSIS — C3492 Malignant neoplasm of unspecified part of left bronchus or lung: Secondary | ICD-10-CM

## 2015-11-19 DIAGNOSIS — R1312 Dysphagia, oropharyngeal phase: Secondary | ICD-10-CM

## 2015-11-19 LAB — COMPREHENSIVE METABOLIC PANEL
ALBUMIN: 3.7 g/dL (ref 3.5–5.0)
ALK PHOS: 333 U/L — AB (ref 40–150)
ALT: 90 U/L — ABNORMAL HIGH (ref 0–55)
AST: 49 U/L — ABNORMAL HIGH (ref 5–34)
Anion Gap: 11 mEq/L (ref 3–11)
BILIRUBIN TOTAL: 2.64 mg/dL — AB (ref 0.20–1.20)
BUN: 35.2 mg/dL — AB (ref 7.0–26.0)
CALCIUM: 10.3 mg/dL (ref 8.4–10.4)
CO2: 23 mEq/L (ref 22–29)
Chloride: 107 mEq/L (ref 98–109)
Creatinine: 1.2 mg/dL (ref 0.7–1.3)
EGFR: 59 mL/min/{1.73_m2} — AB (ref >90)
Glucose: 117 mg/dl (ref 70–140)
POTASSIUM: 4.6 meq/L (ref 3.5–5.1)
Sodium: 141 mEq/L (ref 136–145)
Total Protein: 7.7 g/dL (ref 6.4–8.3)

## 2015-11-19 LAB — CBC WITH DIFFERENTIAL/PLATELET
BASO%: 0.5 % (ref 0.0–2.0)
Basophils Absolute: 0 10*3/uL (ref 0.0–0.1)
EOS ABS: 0.3 10*3/uL (ref 0.0–0.5)
EOS%: 4.9 % (ref 0.0–7.0)
HCT: 39 % (ref 38.4–49.9)
HGB: 13.2 g/dL (ref 13.0–17.1)
LYMPH%: 14 % (ref 14.0–49.0)
MCH: 36.2 pg — ABNORMAL HIGH (ref 27.2–33.4)
MCHC: 33.8 g/dL (ref 32.0–36.0)
MCV: 106.8 fL — AB (ref 79.3–98.0)
MONO#: 0.8 10*3/uL (ref 0.1–0.9)
MONO%: 13.1 % (ref 0.0–14.0)
NEUT%: 67.5 % (ref 39.0–75.0)
NEUTROS ABS: 4 10*3/uL (ref 1.5–6.5)
Platelets: 91 10*3/uL — ABNORMAL LOW (ref 140–400)
RBC: 3.65 10*6/uL — AB (ref 4.20–5.82)
RDW: 14.8 % — ABNORMAL HIGH (ref 11.0–14.6)
WBC: 5.9 10*3/uL (ref 4.0–10.3)
lymph#: 0.8 10*3/uL — ABNORMAL LOW (ref 0.9–3.3)

## 2015-11-19 LAB — TSH: TSH: 3.449 m[IU]/L (ref 0.320–4.118)

## 2015-11-19 MED ORDER — SODIUM CHLORIDE 0.9 % IV SOLN
200.0000 mg | Freq: Once | INTRAVENOUS | Status: AC
  Administered 2015-11-19: 200 mg via INTRAVENOUS
  Filled 2015-11-19: qty 8

## 2015-11-19 MED ORDER — SODIUM CHLORIDE 0.9 % IV SOLN
Freq: Once | INTRAVENOUS | Status: AC
  Administered 2015-11-19: 14:00:00 via INTRAVENOUS

## 2015-11-19 NOTE — Progress Notes (Signed)
Patient's g tube had a broken cap.  I went to the [name] and replaced the cap on the tube while he was getting his infusion to keep him from having to come over to the Radiology department after the infusion was completed.  No complications.

## 2015-11-19 NOTE — Therapy (Signed)
[facility] [address] [place], [place], [address] Phone: [phone]   Fax:  [phone]  Speech Language Pathology Treatment  Patient Details  Name: [name] MRN: [id number] Date of Birth: Dec 31, 1945 Referring Provider: Dr. [name]  Encounter Date: 11/19/2015      End of Session - 11/19/15 1321    Visit Number 7   Number of Visits 17   Date for SLP Re-Evaluation 11/20/15   SLP Start Time 1104   SLP Stop Time  1146   SLP Time Calculation (min) 42 min   Activity Tolerance Patient tolerated treatment well      Past Medical History:  Diagnosis Date  . Abnormality of gait 03/12/2015  . Acute meniscal tear of knee RIGHT KNEE  . ARF (acute renal failure) ([place]) 03/2015   pre-renal patient was septic  . Arthritis RIGHT SHOULDER  . BPH (benign prostatic hypertrophy)   . Dizziness and giddiness 04/23/2014  . 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 HOH (hard of hearing)    bilateral hearing aids  . Hypertension   . Immature cataract BILATERAL  . Non-small cell lung cancer ([place]) DX SEPT 2010  W/ CHEMORADIATION AT THAT TIME -- NOW  W/ METS--  CURRENTLY ON MAINTENANCE CHEMO TX  EVERY 62 DAYS   ONCOLOGIST- [facility]- lung cancer with metastasis.  . Orthostatic hypotension    B/P tends to run 110/70 or less -using med to raise B/P.  Marland Kitchen Pneumonia   . Radiation 01/08/09-01/21/09   Mediastinum 30 Gy x 12 fractions  . Radiation 08/19/14-09/04/14   Palliative RT Porta hepatic LN 30 Gy  . Sepsis ([place]) 03/2015  . Shortness of breath dyspnea    with exertion  . Swallowing impairment    "Unable to swallow"  . Tremor 04/23/2014   Right hand    Past Surgical History:  Procedure Laterality Date  . BILATERAL EAR DRUM SURGERY  1960'S  . BILE DUCT STENT PLACEMENT    . CATARACT EXTRACTION Bilateral   . ERCP N/A 08/09/2014   Procedure: ENDOSCOPIC RETROGRADE  CHOLANGIOPANCREATOGRAPHY (ERCP);  Surgeon: Carol Ada, MD;  Location: [facility];  Service: Endoscopy;  Laterality: N/A;  . ERCP N/A 05/23/2015   Procedure: ENDOSCOPIC RETROGRADE CHOLANGIOPANCREATOGRAPHY (ERCP);  Surgeon: Carol Ada, MD;  Location[facility];  Service: Endoscopy;  Laterality: N/A;  . ERCP N/A 05/30/2015   Procedure: ENDOSCOPIC RETROGRADE CHOLANGIOPANCREATOGRAPHY (ERCP);  Surgeon: Carol Ada, MD;  Location: [facility];  Service: Endoscopy;  Laterality: N/A;  . ESOPHAGOGASTRODUODENOSCOPY (EGD) WITH PROPOFOL N/A 08/09/2014   Procedure: ESOPHAGOGASTRODUODENOSCOPY (EGD) WITH PROPOFOL;  Surgeon: Carol Ada, MD;  Location: [facility] ENDOSCOPY;  Service: Endoscopy;  Laterality: N/A;  . EUS N/A 08/09/2014   Procedure: UPPER ENDOSCOPIC ULTRASOUND (EUS) RADIAL;  Surgeon: Carol Ada, MD;  Location: [facility] ENDOSCOPY;  Service: Endoscopy;  Laterality: N/A;  . FINE NEEDLE ASPIRATION N/A 08/09/2014   Procedure: FINE NEEDLE ASPIRATION (FNA) LINEAR;  Surgeon: Carol Ada, MD;  Location: [facility] ENDOSCOPY;  Service: Endoscopy;  Laterality: N/A;  . GASTROSTOMY TUBE PLACEMENT  10/2014  . KNEE ARTHROSCOPY  12/21/2011   Procedure: ARTHROSCOPY KNEE;  Surgeon: Tobi Bastos, MD;  Location: [facility];  Service: Orthopedics;  Laterality: Right;  WITH MEDIAL MENISECTOMY  . LARYNGOSCOPY  01/2015 and 04/21/15  . ORIF LEFT RING FINGER  AND REVASCULARIZATION OF RADIAL SIDE  02-16-2007   DEGLOVING INJURY  . [place] cath    . PORTACATH  PLACEMENT  Removed 06/20/3015   as of 05-27-15 remains right chest  . ROTATOR CUFF REPAIR  2008   LEFT SHOULDER  . SHOULDER OPEN ROTATOR CUFF REPAIR Right 06/05/2014   Procedure: RIGHT ROTATOR CUFF REPAIR SHOULDER OPEN;  Surgeon: Latanya Maudlin, MD;  Location: [facility] ORS;  Service: Orthopedics;  Laterality: Right;  . TONSILLECTOMY     as child    There were no vitals filed for this visit.      Subjective Assessment - 11/19/15 1109    Subjective "I still only do the  exercises once a day"   Patient is accompained by: Family member  wife   Currently in Pain? No/denies               ADULT SLP TREATMENT - 11/19/15 1110      General Information   Behavior/Cognition Alert;Cooperative;Pleasant mood     Treatment Provided   Treatment provided Dysphagia     Dysphagia Treatment   Treatment Methods Therapeutic exercise;Skilled observation;Patient/caregiver education   Patient observed directly with PO's Yes   Type of PO's observed Ice chips   Other treatment/comments Pt reported completing oral care <30 minutes before ST. Pt has not been trying ice chips at home. Pt with questions about progress, told him progress would likely be hinged closely with his following of recommended frequency of HEP completion, educated pt on nature of atrophy and deconditioning on swallowing mechanism. Pt's wife stated "He is down," and SLP verbalized pt can take advantage of professionals at [facility] to assist with this such as social workers and Chemical engineer trained for this. Pt swallowed ice chips x12 with multiple swallows (4-6) each presentation. STrongly encouraged pt to incr frequency of HEP completion at home.     Assessment / Recommendations / Plan   Plan Continue with current plan of care     Progression Toward Goals   Progression toward goals --  pt with decr'd motivation at this time          SLP Education - 11/19/15 1320    Education provided Yes   Education Details atrophy, deconditioning ramifications on swallowing musculature   Person(s) Educated Patient;Spouse   Methods Explanation   Comprehension Verbalized understanding;Need further instruction          SLP Short Term Goals - 11/11/15 1203      SLP SHORT TERM GOAL #1   Title Pt will perform dysphagia HEP with occasional min A   Status Achieved     SLP SHORT TERM GOAL #2   Title Pt will demonstrate/verbalize precautions for free water protocol with occasional min A   Time 2    Period Weeks   Status Achieved          SLP Long Term Goals - 11/19/15 1324      SLP LONG TERM GOAL #1   Title Pt will perform swallow HEP with rare min A over 4 sessions   Baseline 10-27-15, 11/04/15, 11/11/15   Time 3   Period Weeks   Status Revised     SLP LONG TERM GOAL #2   Title Pt will demonstrate clinical gains/readiness for repeat objective swallowing assessment prior to initiation of PO   Time 3   Period Weeks   Status On-going          Plan - 11/19/15 1323    Clinical Impression Statement Pt continues as not appropriate for MBSS at this time - pt again instructed to practice hard swallows with ice chips at  home as well as to make conerted effort to meet recommended frequency for HEP between ST sessions. Conitnue skilled ST to for readiness for MBSS and initiation of PO's.   Speech Therapy Frequency 1x /week   Duration --  1-2 weeks   Treatment/Interventions SLP instruction and feedback;Internal/external aids;Compensatory strategies;Pharyngeal strengthening exercises;Oral motor exercises;Trials of upgraded texture/liquids;Patient/family education;Diet toleration management by SLP;Aspiration precaution training   Potential to Achieve Goals Fair   Potential Considerations Severity of impairments;Co-morbidities   Consulted and Agree with Plan of Care Patient;Family member/caregiver   Family Member Consulted spouse      Patient will benefit from skilled therapeutic intervention in order to improve the following deficits and impairments:   Dysphagia, oropharyngeal phase    Problem List Patient Active Problem List   Diagnosis Date Noted  . Chronic fatigue 11/10/2015  . Bacteremia due to Klebsiella pneumoniae 04/29/2015  . Infection due to Enterobacter cloacae 04/29/2015  . Hypokalemia 04/29/2015  . Gram-negative bacteremia ([place]) 04/25/2015  . Septic shock ([place]) 04/23/2015  . Malignant neoplasm of lung ([place])   . Sepsis ([place])   . Acute kidney injury ([place])   .  Abnormality of gait 03/12/2015  . Lung cancer ([place])   . Protein-calorie malnutrition, severe ([place]) 11/26/2014  . Hypoalbuminemia 11/18/2014  . Leukocytosis 11/18/2014  . Dysphagia 11/18/2014  . Nausea with vomiting 10/03/2014  . Central line complication 21/01/7355  . Anorexia 10/03/2014  . Weight loss 10/03/2014  . Transaminitis 10/03/2014  . Hyperphosphatemia 10/03/2014  . Encounter for antineoplastic immunotherapy 09/28/2014  . Non-small cell lung cancer ([place]) 09/21/2014  . Abdominal pain   . Fever   . 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 Biliary obstruction   . Fatigue 08/27/2014  . Abdominal pain, acute   . Abdominal mass   . Abdominal pain, generalized 08/07/2014  . CRF (chronic renal failure) 08/07/2014  . Obstructive jaundice 08/07/2014  . Malnutrition of moderate degree ([place]) 08/07/2014  . Obstructive jaundice due to cancer 08/06/2014  . Rotator cuff tear, non-traumatic 06/05/2014  . Dizziness and giddiness 04/23/2014  . Tremor 04/23/2014  . Acute on chronic renal failure ([place]) 05/24/2013  . Pancytopenia ([place]) 05/24/2013  . Osteoarthritis of right knee 12/21/2011  . Meniscus, medial, posterior horn derangement 12/21/2011  . Dyspnea 12/04/2010  . Bronchogenic cancer of left lung ([place]) 12/11/2008  . ARTHRITIS 12/10/2008    [facility] ,MS, CCC-SLP  11/19/2015, 1:26 PM  [facility] [address] [place], [place], [address] Phone: [phone]   Fax:  [phone]   Name: Rangel Echeverri MRN: [id number] Date of Birth: 1946-01-04

## 2015-11-19 NOTE — Progress Notes (Signed)
Ok to treat with Platelets 91 today per MD [facility].

## 2015-11-19 NOTE — Patient Instructions (Addendum)
[facility] Discharge Instructions for Patients Receiving Chemotherapy  Today you received the following chemotherapy agents Keytruda  To help prevent nausea and vomiting after your treatment, we encourage you to take your nausea medication as directed.   If you develop nausea and vomiting that is not controlled by your nausea medication, call the clinic.   BELOW ARE SYMPTOMS THAT SHOULD BE REPORTED IMMEDIATELY:  *FEVER GREATER THAN 100.5 F  *CHILLS WITH OR WITHOUT FEVER  NAUSEA AND VOMITING THAT IS NOT CONTROLLED WITH YOUR NAUSEA MEDICATION  *UNUSUAL SHORTNESS OF BREATH  *UNUSUAL BRUISING OR BLEEDING  TENDERNESS IN MOUTH AND THROAT WITH OR WITHOUT PRESENCE OF ULCERS  *URINARY PROBLEMS  *BOWEL PROBLEMS  UNUSUAL RASH Items with * indicate a potential emergency and should be followed up as soon as possible.  Feel free to call the clinic you have any questions or concerns. The clinic phone number is [phone].  Please show the [facility] at check-in to the Emergency Department and triage nurse.  Pembrolizumab injection What is this medicine? PEMBROLIZUMAB (pem broe liz ue mab) is a monoclonal antibody. It is used to treat melanoma and non-small cell lung cancer. This medicine may be used for other purposes; ask your health care provider or pharmacist if you have questions. What should I tell my health care provider before I take this medicine? They need to know if you have any of these conditions: -diabetes -immune system problems -inflammatory bowel disease -liver disease -lung or breathing disease -lupus -an unusual or allergic reaction to pembrolizumab, other medicines, foods, dyes, or preservatives -pregnant or trying to get pregnant -breast-feeding How should I use this medicine? This medicine is for infusion into a vein. It is given by a health care professional in a hospital or clinic setting. A special MedGuide will be given to you  before each treatment. Be sure to read this information carefully each time. Talk to your pediatrician regarding the use of this medicine in children. Special care may be needed. Overdosage: If you think you have taken too much of this medicine contact a poison control center or emergency room at once. NOTE: This medicine is only for you. Do not share this medicine with others. What if I miss a dose? It is important not to miss your dose. Call your doctor or health care professional if you are unable to keep an appointment. What may interact with this medicine? Interactions have not been studied. Give your health care provider a list of all the medicines, herbs, non-prescription drugs, or dietary supplements you use. Also tell them if you smoke, drink alcohol, or use illegal drugs. Some items may interact with your medicine. This list may not describe all possible interactions. Give your health care provider a list of all the medicines, herbs, non-prescription drugs, or dietary supplements you use. Also tell them if you smoke, drink alcohol, or use illegal drugs. Some items may interact with your medicine. What should I watch for while using this medicine? Your condition will be monitored carefully while you are receiving this medicine. You may need blood work done while you are taking this medicine. Do not become pregnant while taking this medicine or for 4 months after stopping it. Women should inform their doctor if they wish to become pregnant or think they might be pregnant. There is a potential for serious side effects to an unborn child. Talk to your health care professional or pharmacist for more information. Do not breast-feed an infant while  taking this medicine or for 4 months after the last dose. What side effects may I notice from receiving this medicine? Side effects that you should report to your doctor or health care professional as soon as possible: -allergic reactions like skin  rash, itching or hives, swelling of the face, lips, or tongue -bloody or black, tarry stools -breathing problems -change in the amount of urine -changes in vision -chest pain -chills -dark urine -dizziness or feeling faint or lightheaded -fast or irregular heartbeat -fever -flushing -hair loss -muscle pain -muscle weakness -persistent headache -signs and symptoms of high blood sugar such as dizziness; dry mouth; dry skin; fruity breath; nausea; stomach pain; increased hunger or thirst; increased urination -signs and symptoms of liver injury like dark urine, light-colored stools, loss of appetite, nausea, right upper belly pain, yellowing of the eyes or skin -stomach pain -weight loss Side effects that usually do not require medical attention (Report these to your doctor or health care professional if they continue or are bothersome.):constipation -cough -diarrhea -joint pain -tiredness This list may not describe all possible side effects. Call your doctor for medical advice about side effects. You may report side effects to FDA at 1-800-FDA-1088. Where should I keep my medicine? This drug is given in a hospital or clinic and will not be stored at home. NOTE: This sheet is a summary. It may not cover all possible information. If you have questions about this medicine, talk to your doctor, pharmacist, or health care provider.    2016, Elsevier/Gold Standard. (2014-05-14 17:24:19)

## 2015-11-20 ENCOUNTER — Telehealth: Payer: Self-pay | Admitting: *Deleted

## 2015-11-20 NOTE — Telephone Encounter (Signed)
Pt denies any side effects related to yesterday's infusion. Encouraged him to call office as needed for side effect management. He voiced understanding.

## 2015-11-21 ENCOUNTER — Other Ambulatory Visit: Payer: Self-pay | Admitting: Internal Medicine

## 2015-11-21 DIAGNOSIS — C3492 Malignant neoplasm of unspecified part of left bronchus or lung: Secondary | ICD-10-CM

## 2015-11-25 ENCOUNTER — Ambulatory Visit: Payer: Self-pay | Admitting: Radiation Oncology

## 2015-11-26 NOTE — Progress Notes (Signed)
  Radiation Oncology         [phone] ________________________________  Name: [name] MRN: [id number]  Date: 09/24/2015  DOB: [birth year]  SIMULATION AND TREATMENT PLANNING NOTE  DIAGNOSIS:     ICD-9-CM ICD-10-CM   1. 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  Identity was confirmed.  All relevant records and images related to the planned course of therapy were reviewed.   Written consent to proceed with treatment was confirmed which was freely given after reviewing the details related to the planned course of therapy had been reviewed with the patient.  Then, the patient was set-up in a stable reproducible  supine position for radiation therapy.  CT images were obtained.  Surface markings were placed.    Medically necessary complex treatment device(s) for immobilization:  Vac-lock bag.   The CT images were loaded into the planning software.  Then the target and avoidance structures were contoured.  Treatment planning then occurred.  The radiation prescription was entered and confirmed.   I have requested : 3D Simulation  I have requested a DVH of the following structures: target, l kidney, r kidney, cord.   The patient will undergo daily image guidance to ensure accurate localization of the target, and adequate minimize dose to the normal surrounding structures in close proximity to the target.   PLAN:  The patient will receive 30 Gy in 10 fractions.  ________________________________   Jodelle Gross, MD, PhD

## 2015-11-26 NOTE — Addendum Note (Signed)
Encounter addended by: Kyung Rudd, MD on: 11/26/2015 10:22 AM<BR>    Actions taken: Visit diagnoses modified, Sign clinical note

## 2015-11-28 ENCOUNTER — Ambulatory Visit: Payer: Medicare Other

## 2015-11-28 ENCOUNTER — Ambulatory Visit: Payer: Medicare Other | Attending: Family Medicine

## 2015-11-28 DIAGNOSIS — M6281 Muscle weakness (generalized): Secondary | ICD-10-CM | POA: Insufficient documentation

## 2015-11-28 DIAGNOSIS — R1312 Dysphagia, oropharyngeal phase: Secondary | ICD-10-CM | POA: Insufficient documentation

## 2015-11-28 DIAGNOSIS — M542 Cervicalgia: Secondary | ICD-10-CM | POA: Diagnosis present

## 2015-11-28 DIAGNOSIS — R42 Dizziness and giddiness: Secondary | ICD-10-CM | POA: Diagnosis present

## 2015-11-28 DIAGNOSIS — R2689 Other abnormalities of gait and mobility: Secondary | ICD-10-CM | POA: Diagnosis present

## 2015-11-28 NOTE — Therapy (Signed)
[facility] [address] [place], [place], [address] Phone: [phone]   Fax:  [phone]  Speech Language Pathology Treatment  Patient Details  Name: [name] MRN: [id number] Date of Birth: 07/04/1945 Referring Provider: Dr. [name]  Encounter Date: 11/28/2015      End of Session - 11/28/15 1250    Visit Number 8   Number of Visits 17   Date for SLP Re-Evaluation 01/30/16   SLP Start Time 1103   SLP Stop Time  1145   SLP Time Calculation (min) 42 min   Activity Tolerance Patient tolerated treatment well      Past Medical History:  Diagnosis Date  . Abnormality of gait 03/12/2015  . Acute meniscal tear of knee RIGHT KNEE  . ARF (acute renal failure) ([place]) 03/2015   pre-renal patient was septic  . Arthritis RIGHT SHOULDER  . BPH (benign prostatic hypertrophy)   . Dizziness and giddiness 04/23/2014  . 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 HOH (hard of hearing)    bilateral hearing aids  . Hypertension   . Immature cataract BILATERAL  . Non-small cell lung cancer ([place]) DX SEPT 2010  W/ CHEMORADIATION AT THAT TIME -- NOW  W/ METS--  CURRENTLY ON MAINTENANCE CHEMO TX  EVERY 64 DAYS   ONCOLOGIST- [facility]- lung cancer with metastasis.  . Orthostatic hypotension    B/P tends to run 110/70 or less -using med to raise B/P.  Marland Kitchen Pneumonia   . Radiation 01/08/09-01/21/09   Mediastinum 30 Gy x 12 fractions  . Radiation 08/19/14-09/04/14   Palliative RT Porta hepatic LN 30 Gy  . Sepsis ([place]) 03/2015  . Shortness of breath dyspnea    with exertion  . Swallowing impairment    "Unable to swallow"  . Tremor 04/23/2014   Right hand    Past Surgical History:  Procedure Laterality Date  . BILATERAL EAR DRUM SURGERY  1960'S  . BILE DUCT STENT PLACEMENT    . CATARACT EXTRACTION Bilateral   . ERCP N/A 08/09/2014   Procedure: ENDOSCOPIC RETROGRADE  CHOLANGIOPANCREATOGRAPHY (ERCP);  Surgeon: Carol Ada, MD;  Location: [facility];  Service: Endoscopy;  Laterality: N/A;  . ERCP N/A 05/23/2015   Procedure: ENDOSCOPIC RETROGRADE CHOLANGIOPANCREATOGRAPHY (ERCP);  Surgeon: Carol Ada, MD;  Location: [facility] ENDOSCOPY;  Service: Endoscopy;  Laterality: N/A;  . ERCP N/A 05/30/2015   Procedure: ENDOSCOPIC RETROGRADE CHOLANGIOPANCREATOGRAPHY (ERCP);  Surgeon: Carol Ada, MD;  Location: [facility];  Service: Endoscopy;  Laterality: N/A;  . ESOPHAGOGASTRODUODENOSCOPY (EGD) WITH PROPOFOL N/A 08/09/2014   Procedure: ESOPHAGOGASTRODUODENOSCOPY (EGD) WITH PROPOFOL;  Surgeon: Carol Ada, MD;  Location: [facility] ENDOSCOPY;  Service: Endoscopy;  Laterality: N/A;  . EUS N/A 08/09/2014   Procedure: UPPER ENDOSCOPIC ULTRASOUND (EUS) RADIAL;  Surgeon: Carol Ada, MD;  Location: [facility] ENDOSCOPY;  Service: Endoscopy;  Laterality: N/A;  . FINE NEEDLE ASPIRATION N/A 08/09/2014   Procedure: FINE NEEDLE ASPIRATION (FNA) LINEAR;  Surgeon: Carol Ada, MD;  Location: [facility] ENDOSCOPY;  Service: Endoscopy;  Laterality: N/A;  . GASTROSTOMY TUBE PLACEMENT  10/2014  . KNEE ARTHROSCOPY  12/21/2011   Procedure: ARTHROSCOPY KNEE;  Surgeon: Tobi Bastos, MD;  Location: [facility];  Service: Orthopedics;  Laterality: Right;  WITH MEDIAL MENISECTOMY  . LARYNGOSCOPY  01/2015 and 04/21/15  . ORIF LEFT RING FINGER  AND REVASCULARIZATION OF RADIAL SIDE  02-16-2007   DEGLOVING INJURY  . [place] cath    . PORTACATH  PLACEMENT  Removed 06/20/3015   as of 05-27-15 remains right chest  . ROTATOR CUFF REPAIR  2008   LEFT SHOULDER  . SHOULDER OPEN ROTATOR CUFF REPAIR Right 06/05/2014   Procedure: RIGHT ROTATOR CUFF REPAIR SHOULDER OPEN;  Surgeon: Latanya Maudlin, MD;  Location: [facility];  Service: Orthopedics;  Laterality: Right;  . TONSILLECTOMY     as child    There were no vitals filed for this visit.      Subjective Assessment - 11/28/15 1110    Subjective "Hi [name]!" "I guess  "(my wife) wants me to tel you I ate a whole thing of applesauce yesterday."   Patient is accompained by: Family member  wife               ADULT SLP TREATMENT - 11/28/15 1111      General Information   Behavior/Cognition Alert;Cooperative;Pleasant mood     Treatment Provided   Treatment provided Dysphagia     Dysphagia Treatment   Treatment Methods Therapeutic exercise;Skilled observation;Patient/caregiver education   Patient observed directly with PO's Yes   Type of PO's observed Dysphagia 1 (puree);Thin liquids   Other treatment/comments "Generally I got (the applesauce) down with the first swallow." Pt with two swallows with each 1/2-2/3 teaspoon bolus of applesauce x9 today, with cued water wash approx every 3 bites. No overt s/s seen. Pt completed HEP with SBA (breath holding with vocal adduction exercise). Educated pt/wife re: pureed foods pt would likely be safe with, with need for water wash every approx 3 bites. SLP encouraged once/day POs, with oral care before, in the AMs to maximize pt restfullness from previous night's sleep.     Assessment / Recommendations / Plan   Plan Continue with current plan of care     Progression Toward Goals   Progression toward goals Progressing toward goals          SLP Education - 11/28/15 1250    Education provided Yes   Education Details best foods to try to work back to PO diet   Person(s) Educated Patient;Spouse   Methods Explanation   Comprehension Verbalized understanding          SLP Short Term Goals - 11/28/15 1254      SLP SHORT TERM GOAL #1   Title Pt will perform dysphagia HEP with occasional min A   Status Achieved     SLP SHORT TERM GOAL #2   Title Pt will demonstrate/verbalize precautions for free water protocol with occasional min A   Status Achieved          SLP Long Term Goals - 11/28/15 1254      SLP LONG TERM GOAL #1   Title Pt will perform swallow HEP with rare min A over 4 sessions    Status Achieved     SLP LONG TERM GOAL #2   Title Pt will demonstrate clinical gains/readiness for repeat objective swallowing assessment prior to initiation of PO   Status Achieved     SLP LONG TERM GOAL #3   Title pt will tell SLP three signs/symptoms aspiratoin PNA   Time 4   Period Weeks   Status New     SLP LONG TERM GOAL #4   Title pt will tell SLP 3-4 options of foods to eat when transitioning back to PO diet.   Time 4   Period Weeks   Status New          Plan - 11/28/15 1251  Clinical Impression Statement Pt builds his case for working back up to PO diet - ate 9 1/2-2/3 teaspoons of applesauce with water wash today after eating 4 oz applesauce yesterday. No overt s/s aspiration with POs today. SLP encouraged pt to have some PO (pureed like applesauce) iwht water wash each day. SLP was again instructed to practice with ice chips after good oral care at home, as well as to make concerted effort to meet recommended frequency for HEP between ST sessions. Conitnue skilled ST to for assessment of safety with POs, procedure with HEP, adn readiness for MBSS.   Speech Therapy Frequency 1x /week   Duration 4 weeks  1-2 weeks   Treatment/Interventions SLP instruction and feedback;Internal/external aids;Compensatory strategies;Pharyngeal strengthening exercises;Oral motor exercises;Trials of upgraded texture/liquids;Patient/family education;Diet toleration management by SLP;Aspiration precaution training   Potential to Achieve Goals Fair   Potential Considerations Severity of impairments;Co-morbidities   Consulted and Agree with Plan of Care Patient;Family member/caregiver   Family Member Consulted spouse      Patient will benefit from skilled therapeutic intervention in order to improve the following deficits and impairments:   Dysphagia, oropharyngeal phase - Plan: SLP plan of care cert/re-cert    Problem List Patient Active Problem List   Diagnosis Date Noted  . Chronic  fatigue 11/10/2015  . Bacteremia due to Klebsiella pneumoniae 04/29/2015  . Infection due to Enterobacter cloacae 04/29/2015  . Hypokalemia 04/29/2015  . Gram-negative bacteremia ([place]) 04/25/2015  . Septic shock ([place]) 04/23/2015  . Malignant neoplasm of lung ([place])   . Sepsis ([place])   . Acute kidney injury ([place])   . Abnormality of gait 03/12/2015  . Lung cancer ([place])   . Protein-calorie malnutrition, severe ([place]) 11/26/2014  . Hypoalbuminemia 11/18/2014  . Leukocytosis 11/18/2014  . Dysphagia 11/18/2014  . Nausea with vomiting 10/03/2014  . Central line complication 81/19/1478  . Anorexia 10/03/2014  . Weight loss 10/03/2014  . Transaminitis 10/03/2014  . Hyperphosphatemia 10/03/2014  . Encounter for antineoplastic immunotherapy 09/28/2014  . Non-small cell lung cancer ([place]) 09/21/2014  . Abdominal pain   . Fever   . 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 Biliary obstruction   . Fatigue 08/27/2014  . Abdominal pain, acute   . Abdominal mass   . Abdominal pain, generalized 08/07/2014  . CRF (chronic renal failure) 08/07/2014  . Obstructive jaundice 08/07/2014  . Malnutrition of moderate degree ([place]) 08/07/2014  . Obstructive jaundice due to cancer 08/06/2014  . Rotator cuff tear, non-traumatic 06/05/2014  . Dizziness and giddiness 04/23/2014  . Tremor 04/23/2014  . Acute on chronic renal failure ([place]) 05/24/2013  . Pancytopenia ([place]) 05/24/2013  . Osteoarthritis of right knee 12/21/2011  . Meniscus, medial, posterior horn derangement 12/21/2011  . Dyspnea 12/04/2010  . Bronchogenic cancer of left lung ([place]) 12/11/2008  . ARTHRITIS 12/10/2008    [facility] ,[place], [place]  11/28/2015, 12:58 PM  [facility] [address] [place], [place], [address] Phone: [phone]   Fax:  [phone]   Name: [name] MRN: [id number] Date of Birth: 07/26/1945

## 2015-11-28 NOTE — Patient Instructions (Signed)
Eat approx 4 oz. Each day of pureed food items we talked about today. You want the consistency of applesauce. Cut one can (likely the 1.5) so that you feel hungry. Monitor your weight. Eat after good oral care. Continue the exercises, try to get twice a day average. Continue the ice chips after oral care.

## 2015-12-02 ENCOUNTER — Ambulatory Visit: Payer: Self-pay | Admitting: Radiation Oncology

## 2015-12-03 ENCOUNTER — Encounter: Payer: Self-pay | Admitting: Radiation Oncology

## 2015-12-03 ENCOUNTER — Ambulatory Visit
Admission: RE | Admit: 2015-12-03 | Discharge: 2015-12-03 | Disposition: A | Discharge status: Home / Self Care | Payer: Medicare Other | Source: Ambulatory Visit | Attending: Radiation Oncology | Admitting: Radiation Oncology

## 2015-12-03 VITALS — BP 108/90 | HR 84 | Temp 97.6°F | Resp 16 | Ht 68.0 in | Wt 137.8 lb

## 2015-12-03 DIAGNOSIS — C3492 Malignant neoplasm of unspecified part of left bronchus or lung: Secondary | ICD-10-CM | POA: Diagnosis present

## 2015-12-03 DIAGNOSIS — C772 Secondary and unspecified malignant neoplasm of intra-abdominal lymph nodes: Secondary | ICD-10-CM | POA: Diagnosis present

## 2015-12-03 DIAGNOSIS — R635 Abnormal weight gain: Secondary | ICD-10-CM | POA: Diagnosis not present

## 2015-12-03 NOTE — Progress Notes (Signed)
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  Interval Since Last Radiation:  6 weeks   09/29/2015 to 10/13/2015:  The Right retroperitoneal LN was treated to 30 Gy in 10 fractions at 3 Gy per fraction.  01/22/15-02/04/15: 30 Gy to a right paratracheal lymph node in 10 fractions  08/19/14-09/04/14: 30 Gy to the pancreas over 12 fractions  12/2008: completed palliative radiotherapy to the mediastinum with 30 Gy over 12 fractions  Narrative:  The patient returns today for routine follow-up. He tolerated radiotherapy with mild fatigue and pain in the right groin.  He did have trouble tolerating all of his enteral feedings requiring IV hydration. Fortunately he has been able to maintain his nutrition status, and just resumed immunotherapy on 11/20/15 with Dr. [name].                           On review of systems, the patient states he is doing well overall. He has been working with OT and speech therapy and has been trying to regain his swallowing skills. He is eating applesauce now, and is hopeful to be able to discontinue his G tube soon. He reports he is not having any pain right now in his abdomen or right groin region. He denies chest pain, shortness of breath, fevers, or chills. No other complaints are noted.  ALLERGIES:  is allergic to fentanyl; augmentin [amoxicillin-pot clavulanate]; levaquin [levofloxacin]; percocet [oxycodone-acetaminophen]; scopolamine; zolpidem tartrate; and carboplatin.  Meds: Current Outpatient Prescriptions  Medication Sig Dispense Refill  . b complex vitamins tablet Take 1 tablet by mouth daily.    Marland Kitchen dicyclomine (BENTYL) 20 MG tablet Take 20 mg by  mouth every 6 (six) hours as needed (for abdominal cramps).    . diphenhydramine-acetaminophen (TYLENOL PM) 25-500 MG TABS tablet Take 1 tablet by mouth at bedtime as needed (for sleep).     . FeFum-FePoly-FA-B Cmp-C-Biot (INTEGRA PLUS) CAPS Give 1 capsule by tube daily.    Marland Kitchen HYDROmorphone (DILAUDID) 2 MG tablet Take 2-4 mg by mouth every 4 (four) hours as needed for severe pain.    Marland Kitchen LORazepam (ATIVAN) 1 MG tablet Take 0.5-1 mg by mouth every 4 (four) hours as needed for anxiety (and/or nausea).    . midodrine (PROAMATINE) 5 MG tablet Take 5 mg by mouth 3 (three) times daily with meals.    . Multiple Vitamin (MULTIVITAMIN WITH MINERALS) TABS tablet Take 1 tablet by mouth daily.    . mupirocin ointment (BACTROBAN) 2 % Apply 1 application topically 3 (three) times daily. Pt applies around tube area.  0  . naproxen sodium (ANAPROX) 220 MG tablet Take 220-440 mg by mouth 2 (two) times daily as needed (for pain).     . Nutritional Supplements (FEEDING SUPPLEMENT, OSMOLITE 1.5 CAL,) LIQD Place 237 mLs into feeding tube 3 (three) times daily.     . Nutritional Supplements (TWOCAL HN) LIQD Place 237 mLs into feeding tube 3 (three) times daily.    Marland Kitchen omeprazole (PRILOSEC) 40 MG capsule Take 40 mg by mouth at bedtime.     . ondansetron (ZOFRAN) 8 MG tablet Take 1 tablet (8 mg total) by mouth every 8 (eight)  hours as needed for nausea or vomiting. 30 tablet 0  . promethazine (PHENERGAN) 25 MG tablet Take 25 mg by mouth every 6 (six) hours as needed for nausea or vomiting.    . senna (SENOKOT) 8.6 MG tablet Take 1 tablet by mouth daily.    Marland Kitchen FeFum-FePoly-FA-B Cmp-C-Biot (INTEGRA PLUS) CAPS Take 1 capsule by mouth  every morning (Patient not taking: Reported on 12/03/2015) 60 capsule 2   No current facility-administered medications for this encounter.     Physical Findings:  height is '5\' 8"'$  (1.727 m) and weight is 137 lb 12.8 oz (62.5 kg). His temperature is 97.6 F (36.4 C). His blood pressure is 108/90 and  his pulse is 84. His respiration is 16 and oxygen saturation is 99%.  In general this is a well appearing caucasian male in no acute distress. He's alert and oriented x4 and appropriate throughout the examination. Cardiopulmonary assessment is negative for acute distress and he exhibits normal effort. His abdomen is evaluated and reveals an intact G tube site without cellulitic change. The abdominal skin is negative for desquamation or rash.  Lab Findings: Lab Results  Component Value Date   WBC 5.9 11/19/2015   HGB 13.2 11/19/2015   HCT 39.0 11/19/2015   MCV 106.8 (H) 11/19/2015   PLT 91 (L) 11/19/2015     Radiographic Findings: No results found.  Impression/Plan: 1. 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 The patient is doing very well since completing radiation, his pain is almost completely resolved. We will continue to see him as needed and he will follow up with medical oncology moving forward. 2. Weight gain. The patient's labs were also reviewed and in addition to weight gain, his albumin is starting to improve as well. He continues to diligently work with nutrition and OT and is advancing his diet to foods like applesauce and is doing well without aspiration.      Carola Rhine, PAC

## 2015-12-03 NOTE — Progress Notes (Addendum)
Mr. [name] here for reassessment s/p XRT to retroperitoneal lymph nodes. He continues using his Peg tube for main nutrition- Osmolite 1.5 3 cans daily and TWOCal HN 3 cans daily.  Eating applesauce.  He denies any pain at this time and reports that he sleeps at least 12 hours daily.    BP 108/90 (BP Location: Right Arm, Patient Position: Sitting, Cuff Size: Normal)   Pulse 84   Temp 97.6 F (36.4 C)   Resp 16   Ht '5\' 8"'$  (1.727 m)   Wt 137 lb 12.8 oz (62.5 kg)   SpO2 99%   BMI 20.95 kg/m    Wt Readings from Last 3 Encounters:  12/03/15 137 lb 12.8 oz (62.5 kg)  11/10/15 130 lb 4.8 oz (59.1 kg)  10/21/15 123 lb (55.8 kg)    2 weeks ago Wt Readings from Last 3 Encounters:  11/10/15 130 lb 4.8 oz (59.1 kg)  10/21/15 123 lb (55.8 kg)  10/13/15 123 lb 14.4 oz (56.2 kg)

## 2015-12-04 ENCOUNTER — Other Ambulatory Visit: Payer: Self-pay | Admitting: Family Medicine

## 2015-12-04 ENCOUNTER — Ambulatory Visit: Payer: Medicare Other | Admitting: Speech Pathology

## 2015-12-04 DIAGNOSIS — R1312 Dysphagia, oropharyngeal phase: Secondary | ICD-10-CM | POA: Diagnosis not present

## 2015-12-04 DIAGNOSIS — R1310 Dysphagia, unspecified: Secondary | ICD-10-CM

## 2015-12-04 NOTE — Patient Instructions (Signed)
  Continue HEP twice daily  After good oral care, practice hard swallows with ice chips or small water sips

## 2015-12-04 NOTE — Therapy (Signed)
[facility] [address] [place], [place], [address] Phone: [phone]   Fax:  [phone]  Speech Language Pathology Treatment  Patient Details  Name: [name] MRN: [id number] Date of Birth: 04-May-1945 Referring Provider: Dr. [name]  Encounter Date: 12/04/2015      End of Session - 12/04/15 1502    Visit Number 9   Number of Visits 17   Date for SLP Re-Evaluation 01/30/16   Authorization Type pt placed on hold with ST on 12/04/15 until after MBSS completed on 12/19/15 -this will change date for re-eval, pending results of MBSS   SLP Start Time 1403   SLP Stop Time  1444   SLP Time Calculation (min) 41 min      Past Medical History:  Diagnosis Date  . Abnormality of gait 03/12/2015  . Acute meniscal tear of knee RIGHT KNEE  . ARF (acute renal failure) ([place]) 03/2015   pre-renal patient was septic  . Arthritis RIGHT SHOULDER  . BPH (benign prostatic hypertrophy)   . Dizziness and giddiness 04/23/2014  . 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 HOH (hard of hearing)    bilateral hearing aids  . Hypertension   . Immature cataract BILATERAL  . Non-small cell lung cancer ([place]) DX SEPT 2010  W/ CHEMORADIATION AT THAT TIME -- NOW  W/ METS--  CURRENTLY ON MAINTENANCE CHEMO TX  EVERY 19 DAYS   ONCOLOGIST- DR [facility]- lung cancer with metastasis.  . Orthostatic hypotension    B/P tends to run 110/70 or less -using med to raise B/P.  Marland Kitchen Pneumonia   . Radiation 01/08/09-01/21/09   Mediastinum 30 Gy x 12 fractions  . Radiation 08/19/14-09/04/14   Palliative RT Porta hepatic LN 30 Gy  . Sepsis ([place]) 03/2015  . Shortness of breath dyspnea    with exertion  . Swallowing impairment    "Unable to swallow"  . Tremor 04/23/2014   Right hand    Past Surgical History:  Procedure Laterality Date  . BILATERAL EAR DRUM SURGERY  1960'S  . BILE DUCT STENT  PLACEMENT    . CATARACT EXTRACTION Bilateral   . ERCP N/A 08/09/2014   Procedure: ENDOSCOPIC RETROGRADE CHOLANGIOPANCREATOGRAPHY (ERCP);  Surgeon: Carol Ada, MD;  Location: [facility];  Service: Endoscopy;  Laterality: N/A;  . ERCP N/A 05/23/2015   Procedure: ENDOSCOPIC RETROGRADE CHOLANGIOPANCREATOGRAPHY (ERCP);  Surgeon: Carol Ada, MD;  Location: [facility] ENDOSCOPY;  Service: Endoscopy;  Laterality: N/A;  . ERCP N/A 05/30/2015   Procedure: ENDOSCOPIC RETROGRADE CHOLANGIOPANCREATOGRAPHY (ERCP);  Surgeon: Carol Ada, MD;  Location: [facility];  Service: Endoscopy;  Laterality: N/A;  . ESOPHAGOGASTRODUODENOSCOPY (EGD) WITH PROPOFOL N/A 08/09/2014   Procedure: ESOPHAGOGASTRODUODENOSCOPY (EGD) WITH PROPOFOL;  Surgeon: Carol Ada, MD;  Location: [facility] ENDOSCOPY;  Service: Endoscopy;  Laterality: N/A;  . EUS N/A 08/09/2014   Procedure: UPPER ENDOSCOPIC ULTRASOUND (EUS) RADIAL;  Surgeon: Carol Ada, MD;  Location: [facility] ENDOSCOPY;  Service: Endoscopy;  Laterality: N/A;  . FINE NEEDLE ASPIRATION N/A 08/09/2014   Procedure: FINE NEEDLE ASPIRATION (FNA) LINEAR;  Surgeon: Carol Ada, MD;  Location: [facility] ENDOSCOPY;  Service: Endoscopy;  Laterality: N/A;  . GASTROSTOMY TUBE PLACEMENT  10/2014  . KNEE ARTHROSCOPY  12/21/2011   Procedure: ARTHROSCOPY KNEE;  Surgeon: Tobi Bastos, MD;  Location: [facility];  Service: Orthopedics;  Laterality: Right;  WITH MEDIAL MENISECTOMY  . LARYNGOSCOPY  01/2015 and 04/21/15  . ORIF LEFT RING FINGER  AND REVASCULARIZATION OF RADIAL SIDE  02-16-2007   DEGLOVING INJURY  . [place] cath    . PORTACATH PLACEMENT  Removed 06/20/3015   as of 05-27-15 remains right chest  . ROTATOR CUFF REPAIR  2008   LEFT SHOULDER  . SHOULDER OPEN ROTATOR CUFF REPAIR Right 06/05/2014   Procedure: RIGHT ROTATOR CUFF REPAIR SHOULDER OPEN;  Surgeon: Latanya Maudlin, MD;  Location: [facility] ORS;  Service: Orthopedics;  Laterality: Right;  . TONSILLECTOMY     as child    There were no vitals  filed for this visit.      Subjective Assessment - 12/04/15 1453    Subjective "I tried some pudding at home but it didn't go down"   Patient is accompained by: Family member   Special Tests spouse   Currently in Pain? No/denies               ADULT SLP TREATMENT - 12/04/15 1454      General Information   Behavior/Cognition Alert;Cooperative;Pleasant mood     Treatment Provided   Treatment provided Dysphagia     Dysphagia Treatment   Treatment Methods Therapeutic exercise;Skilled observation;Patient/caregiver education   Patient observed directly with PO's Yes   Type of PO's observed Dysphagia 1 (puree);Thin liquids   Feeding Able to feed self   Other treatment/comments 1/4-1/2 tsp puree with verbal cues for hard swallow resulted in double swallows consistently. Pt reports feeling bolus residue in pharynx. He states this clears with liquid wash. Instructed pt to use sip after each bite today. I explained that alternating solids and liquids may be an ongoing strategy for him. Pt continues to perfrom HEP without difficulty. No overt s/s of aspiration duirng this session.      Pain Assessment   Pain Assessment No/denies pain     Assessment / Recommendations / Plan   Plan Continue with current plan of care     Progression Toward Goals   Progression toward goals Progressing toward goals          SLP Education - 12/04/15 1459    Education provided Yes   Education Details progress in therapy; plan for repeat MBSS   Person(s) Educated Patient;Spouse   Methods Explanation   Comprehension Verbalized understanding          SLP Short Term Goals - 12/04/15 1502      SLP SHORT TERM GOAL #1   Title Pt will perform dysphagia HEP with occasional min A   Status Achieved     SLP SHORT TERM GOAL #2   Title Pt will demonstrate/verbalize precautions for free water protocol with occasional min A   Status Achieved          SLP Long Term Goals - 12/04/15 1502      SLP  LONG TERM GOAL #1   Title Pt will perform swallow HEP with rare min A over 4 sessions   Status Achieved     SLP LONG TERM GOAL #2   Title Pt will demonstrate clinical gains/readiness for repeat objective swallowing assessment prior to initiation of PO   Status Achieved     SLP LONG TERM GOAL #3   Title pt will tell SLP three signs/symptoms aspiratoin PNA   Time 4   Period Weeks   Status On-going     SLP LONG TERM GOAL #4   Title pt will tell SLP 3-4 options of foods to eat when transitioning back to PO diet.   Time 4   Period Weeks  Status On-going          Plan - 12/04/15 1459    Clinical Impression Statement Pt reported limited PO trials of puree consistency at home. He continues to feel residue of bolus in his pharynx which clears with liquid sip. As pt I with HEP and overall stronger, gaining weight, I recommend MBSS be repeated prior to initiating full PO diet. Scheduled this for 12/19/15. Pt will be placed on hold until after MBSS and will continue HEP and ice chips at home. We will plan to see pt week after MBSS if needed.    Speech Therapy Frequency 1x /week   Duration --  2 more weeks after MBSS on 12/19/15   Treatment/Interventions SLP instruction and feedback;Internal/external aids;Compensatory strategies;Pharyngeal strengthening exercises;Oral motor exercises;Trials of upgraded texture/liquids;Patient/family education;Diet toleration management by SLP;Aspiration precaution training   Potential to Achieve Goals Good   Potential Considerations Severity of impairments;Co-morbidities   Consulted and Agree with Plan of Care Patient;Family member/caregiver   Family Member Consulted spouse      Patient will benefit from skilled therapeutic intervention in order to improve the following deficits and impairments:   Dysphagia, oropharyngeal phase - Plan: SLP modified barium swallow    Problem List Patient Active Problem List   Diagnosis Date Noted  . Chronic fatigue  11/10/2015  . Bacteremia due to Klebsiella pneumoniae 04/29/2015  . Infection due to Enterobacter cloacae 04/29/2015  . Hypokalemia 04/29/2015  . Gram-negative bacteremia ([place]) 04/25/2015  . Septic shock ([place]) 04/23/2015  . Malignant neoplasm of lung ([place])   . Sepsis ([place])   . Acute kidney injury ([place])   . Abnormality of gait 03/12/2015  . Lung cancer ([place])   . Protein-calorie malnutrition, severe ([place]) 11/26/2014  . Hypoalbuminemia 11/18/2014  . Leukocytosis 11/18/2014  . Dysphagia 11/18/2014  . Nausea with vomiting 10/03/2014  . Central line complication 62/69/4854  . Anorexia 10/03/2014  . Weight loss 10/03/2014  . Transaminitis 10/03/2014  . Hyperphosphatemia 10/03/2014  . Encounter for antineoplastic immunotherapy 09/28/2014  . Non-small cell lung cancer ([place]) 09/21/2014  . Abdominal pain   . Fever   . 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 Biliary obstruction   . Fatigue 08/27/2014  . Abdominal pain, acute   . Abdominal mass   . Abdominal pain, generalized 08/07/2014  . CRF (chronic renal failure) 08/07/2014  . Obstructive jaundice 08/07/2014  . Malnutrition of moderate degree ([place]) 08/07/2014  . Obstructive jaundice due to cancer 08/06/2014  . Rotator cuff tear, non-traumatic 06/05/2014  . Dizziness and giddiness 04/23/2014  . Tremor 04/23/2014  . Acute on chronic renal failure ([place]) 05/24/2013  . Pancytopenia ([place]) 05/24/2013  . Osteoarthritis of right knee 12/21/2011  . Meniscus, medial, posterior horn derangement 12/21/2011  . Dyspnea 12/04/2010  . Bronchogenic cancer of left lung ([place]) 12/11/2008  . ARTHRITIS 12/10/2008    Eara Burruel, Annye Rusk MS, CCC-SLP 12/04/2015, 3:04 PM  [facility] [address] [place], [place], [address] Phone: [phone]   Fax:  [phone]   Name: Lavel Rieman MRN: [id number] Date of Birth: 22-May-1945

## 2015-12-05 ENCOUNTER — Ambulatory Visit: Payer: Medicare Other | Admitting: Physical Therapy

## 2015-12-05 ENCOUNTER — Encounter: Payer: Self-pay | Admitting: Physical Therapy

## 2015-12-05 VITALS — BP 126/81 | HR 81

## 2015-12-05 DIAGNOSIS — R2689 Other abnormalities of gait and mobility: Secondary | ICD-10-CM

## 2015-12-05 DIAGNOSIS — M6281 Muscle weakness (generalized): Secondary | ICD-10-CM

## 2015-12-05 DIAGNOSIS — R1312 Dysphagia, oropharyngeal phase: Secondary | ICD-10-CM | POA: Diagnosis not present

## 2015-12-05 NOTE — Patient Instructions (Addendum)
Walking Program:  Begin walking for exercise for 10 minutes, 1-2 times/day, 5 days/week.   Progress your walking program by adding 1 minute to your routine each week, as tolerated. Be sure to wear good walking shoes, walk in a safe environment and only progress to your tolerance.      SINGLE LIMB STANCE    Stand facing countertop. Fingertip support on counter, as needed. Stand on one leg, attempting to hold for 15 seconds. Repeat with other leg.  Try this exercise 3 times on each side. Repeat _1-2_ times per day.  Copyright  VHI. All rights reserved.   Functional Quadriceps: Sit to Stand    Sit on edge of chair, feet flat on floor. Stand upright, extending knees fully. Repeat _6-7___ times per set. Do __3__ sets per session. Do __1-2__ sessions per day.  Copyright  VHI. All rights reserved.

## 2015-12-05 NOTE — Therapy (Signed)
[facility] [address] [place], [place], [address] Phone: [phone]   Fax:  [phone]  Physical Therapy Treatment  Patient Details  Name: [name] MRN: [id number] Date of Birth: 03-Oct-1945 Referring Provider: Dr. [name]  Encounter Date: 12/05/2015      PT End of Session - 12/05/15 1246    Visit Number 2   Number of Visits 9   Date for PT Re-Evaluation 01/17/16   Authorization Type Medicare - GCodes and progress notes required   PT Start Time 1151   PT Stop Time 1232   PT Time Calculation (min) 41 min   Activity Tolerance Patient tolerated treatment well      Past Medical History:  Diagnosis Date  . Abnormality of gait 03/12/2015  . Acute meniscal tear of knee RIGHT KNEE  . ARF (acute renal failure) ([place]) 03/2015   pre-renal patient was septic  . Arthritis RIGHT SHOULDER  . BPH (benign prostatic hypertrophy)   . Dizziness and giddiness 04/23/2014  . 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 HOH (hard of hearing)    bilateral hearing aids  . Hypertension   . Immature cataract BILATERAL  . Non-small cell lung cancer ([place]) DX SEPT 2010  W/ CHEMORADIATION AT THAT TIME -- NOW  W/ METS--  CURRENTLY ON MAINTENANCE CHEMO TX  EVERY 70 DAYS   ONCOLOGIST- DR [facility]- lung cancer with metastasis.  . Orthostatic hypotension    B/P tends to run 110/70 or less -using med to raise B/P.  Marland Kitchen Pneumonia   . Radiation 01/08/09-01/21/09   Mediastinum 30 Gy x 12 fractions  . Radiation 08/19/14-09/04/14   Palliative RT Porta hepatic LN 30 Gy  . Sepsis ([place]) 03/2015  . Shortness of breath dyspnea    with exertion  . Swallowing impairment    "Unable to swallow"  . Tremor 04/23/2014   Right hand    Past Surgical History:  Procedure Laterality Date  . BILATERAL EAR DRUM SURGERY  1960'S  . BILE DUCT STENT PLACEMENT    . CATARACT EXTRACTION Bilateral   .  ERCP N/A 08/09/2014   Procedure: ENDOSCOPIC RETROGRADE CHOLANGIOPANCREATOGRAPHY (ERCP);  Surgeon: Carol Ada, MD;  Location: [facility]OSCOPY;  Service: Endoscopy;  Laterality: N/A;  . ERCP N/A 05/23/2015   Procedure: ENDOSCOPIC RETROGRADE CHOLANGIOPANCREATOGRAPHY (ERCP);  Surgeon: Carol Ada, MD;  Location: [facility] ENDOSCOPY;  Service: Endoscopy;  Laterality: N/A;  . ERCP N/A 05/30/2015   Procedure: ENDOSCOPIC RETROGRADE CHOLANGIOPANCREATOGRAPHY (ERCP);  Surgeon: Carol Ada, MD;  Location: [facility];  Service: Endoscopy;  Laterality: N/A;  . ESOPHAGOGASTRODUODENOSCOPY (EGD) WITH PROPOFOL N/A 08/09/2014   Procedure: ESOPHAGOGASTRODUODENOSCOPY (EGD) WITH PROPOFOL;  Surgeon: Carol Ada, MD;  Location: [facility] ENDOS[facility];  Service: Endoscopy;  Laterality: N/A;  . EUS N/A 08/09/2014   Procedure: UPPER ENDOSCOPIC ULTRASOUND (EUS) RADIAL;  Surgeon: Carol Ada, MD;  Location: [facility] ENDOSCOPY;  Service: Endoscopy;  Laterality: N/A;  . FINE NEEDLE ASPIRATION N/A 08/09/2014   Procedure: FINE NEEDLE ASPIRATION (FNA) LINEAR;  Surgeon: Carol Ada, MD;  Location: [facility] ENDOSCOPY;  Service: Endoscopy;  Laterality: N/A;  . GASTROSTOMY TUBE PLACEMENT  10/2014  . KNEE ARTHROSCOPY  12/21/2011   Procedure: ARTHROSCOPY KNEE;  Surgeon: Tobi Bastos, MD;  [facility];  Service: Orthopedics;  Laterality: Right;  WITH MEDIAL MENISECTOMY  . LARYNGOSCOPY  01/2015 and 04/21/15  . ORIF LEFT RING FINGER  AND REVASCULARIZATION OF RADIAL SIDE  02-16-2007   DEGLOVING  INJURY  . [place] cath    . PORTACATH PLACEMENT  Removed 06/20/3015   as of 05-27-15 remains right chest  . ROTATOR CUFF REPAIR  2008   LEFT SHOULDER  . SHOULDER OPEN ROTATOR CUFF REPAIR Right 06/05/2014   Procedure: RIGHT ROTATOR CUFF REPAIR SHOULDER OPEN;  Surgeon: Latanya Maudlin, MD;  Location: [facility];  Service: Orthopedics;  Laterality: Right;  . TONSILLECTOMY     as child    Vitals:   12/05/15 1223  BP: 126/81  Pulse: 81  SpO2: 96%         Subjective Assessment - 12/05/15 1225    Subjective Pt reports walking at home for 6 min with no issues.  He states he has a "big hill that tires me out".     Currently in Pain? No/denies   Multiple Pain Sites No      NMR:  Pt performed 1x3 B SLS with 15 sec hold at countertop to incr balance to allow pt to incr safety at home and in the community when performing ADLs and ambulating.  Pt required SBA during activity and fingertip support on counter.  Pt and his wife were educated on importance of improving dynamic balance to incr safety along with sequencing and properly performing exercise.  Therex:  1x7 STS with arms crossed from standard chair with SBA to incr strength in B LE in order for pt to incr functional mobility and incr safety when ambulating at home.  Pt reported incr fatigue after exercise with SPT noting SOB at the end of exercise.  Pt and wife educated on importance of LE strength for balance and ambulation to incr safety.                   [facility] Adult PT Treatment/Exercise - 12/05/15 0001      Standardized Balance Assessment   Standardized Balance Assessment Dynamic Gait Index     Dynamic Gait Index   Level Surface Mild Impairment   Change in Gait Speed Mild Impairment   Gait with Horizontal Head Turns Moderate Impairment   Gait with Vertical Head Turns Moderate Impairment   Gait and Pivot Turn Mild Impairment   Step Over Obstacle Moderate Impairment   Step Around Obstacles Mild Impairment   Steps Mild Impairment   Total Score 13                PT Education - 12/05/15 1244    Education provided Yes   Education Details Pt educated on new HEP including SLS at countertop and sit to stand to incr balance and strength.  Pt also educated on importance of consistency with walking program at home.    Person(s) Educated Patient;Spouse   Methods Explanation;Demonstration;Handout   Comprehension Verbalized understanding;Returned demonstration           PT Short Term Goals - 11/18/15 1709      PT SHORT TERM GOAL #1   Title Pt will be IND in HEP to improve strength, endurance, and balance. TARGET DATE FOR ALL STGS: 12/16/15   Status New     PT SHORT TERM GOAL #2   Title Perform DGI and write goals prn.    Status New     PT SHORT TERM GOAL #3   Title Pt will amb. 200' with SPC and S to improve functional mobility, over even terrain.    Status New     PT SHORT TERM GOAL #4   Title Pt will perform TUG with LRAD in </=  13.5sec. to decr. falls risk.            PT Long Term Goals - 12/05/15 1253      PT LONG TERM GOAL #1   Title Pt will improve 6MWT distance from 798' to 998' to improve endurance. TARGET DATE FOR ALL LTGS: 01/13/16   Status New     PT LONG TERM GOAL #2   Title Assess ability to amb. on treadmill and write goal if applicable and safe.   Status New     PT LONG TERM GOAL #3   Title Pt will amb. 100' without AD, with S, in order to amb. safely at home.    Status New     PT LONG TERM GOAL #4   Title Pt will amb. 400' over even/paved surfaces with LRAD at MOD I level in order to improve functional mobility.    Status New     PT LONG TERM GOAL #5   Title Assess stair climbing ability and write goals prn.    Status New     PT LONG TERM GOAL #6   Title Pt will incr DGI to > 19/24 to allow pt to ambulate safely at home and in the community.     Baseline 13/24   Status New               Plan - 12/05/15 1249    Clinical Impression Statement Pt tolerated treatment well in today' session as evidenced by his ability to ambulate at distances not yet attempted with only moderate fatigue and with vitals WNL after activity.  DGI results suggest a need for further balance and gait training with noted deficits in B LE strength.  Pt is willing and eager to progress is therapy and incr his fuctional status.  He also has strong social support at home from his wife.  Pt would continnue to benefit from skilled PT  to address the deficits mentioned.   Rehab Potential Good   Clinical Impairments Affecting Rehab Potential Pt has metastatic lung cancer and is currently receiving treatment   PT Frequency 1x / week   PT Duration 8 weeks   PT Treatment/Interventions ADLs/Self Care Home Management;Canalith Repostioning;DME Instruction;Gait training;Stair training;Functional mobility training;Therapeutic activities;Therapeutic exercise;Balance training;Vestibular;Neuromuscular re-education;Patient/family education   PT Next Visit Plan B LE strengthening exercises, dynamic balance activities, progress walking program   PT Home Exercise Plan sit to stands, SLS at counter   Consulted and Agree with Plan of Care Patient;Family member/caregiver   Family Member Consulted wife, [name]      Patient will benefit from skilled therapeutic intervention in order to improve the following deficits and impairments:  Dizziness, Abnormal gait, Decreased mobility, Decreased endurance, Decreased balance, Postural dysfunction, Decreased strength  Visit Diagnosis: Other abnormalities of gait and mobility  Muscle weakness (generalized)     Problem List Patient Active Problem List   Diagnosis Date Noted  . Chronic fatigue 11/10/2015  . Bacteremia due to Klebsiella pneumoniae 04/29/2015  . Infection due to Enterobacter cloacae 04/29/2015  . Hypokalemia 04/29/2015  . Gram-negative bacteremia ([place]) 04/25/2015  . Septic shock ([place]) 04/23/2015  . Malignant neoplasm of lung ([place])   . Sepsis ([place])   . Acute kidney injury ([place])   . Abnormality of gait 03/12/2015  . Lung cancer ([place])   . Protein-calorie malnutrition, severe ([place]) 11/26/2014  . Hypoalbuminemia 11/18/2014  . Leukocytosis 11/18/2014  . Dysphagia 11/18/2014  . Nausea with vomiting 10/03/2014  . Central line complication 31/51/7616  .  Anorexia 10/03/2014  . Weight loss 10/03/2014  . Transaminitis 10/03/2014  . Hyperphosphatemia 10/03/2014  . Encounter for  antineoplastic immunotherapy 09/28/2014  . Non-small cell lung cancer ([place]) 09/21/2014  . Abdominal pain   . Fever   . 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 Biliary obstruction   . Fatigue 08/27/2014  . Abdominal pain, acute   . Abdominal mass   . Abdominal pain, generalized 08/07/2014  . CRF (chronic renal failure) 08/07/2014  . Obstructive jaundice 08/07/2014  . Malnutrition of moderate degree ([place]) 08/07/2014  . Obstructive jaundice due to cancer 08/06/2014  . Rotator cuff tear, non-traumatic 06/05/2014  . Dizziness and giddiness 04/23/2014  . Tremor 04/23/2014  . Acute on chronic renal failure ([place]) 05/24/2013  . Pancytopenia ([place]) 05/24/2013  . Osteoarthritis of right knee 12/21/2011  . Meniscus, medial, posterior horn derangement 12/21/2011  . Dyspnea 12/04/2010  . Bronchogenic cancer of left lung ([place]) 12/11/2008  . ARTHRITIS 12/10/2008    Katherine Mantle, SPT 12/05/2015, 12:59 PM  [facility] [address] [place], [place], [address] Phone: [phone]   Fax:  [phone]  Name: [name] MRN: [id number] Date of Birth: February 01, 1946

## 2015-12-11 ENCOUNTER — Other Ambulatory Visit (HOSPITAL_BASED_OUTPATIENT_CLINIC_OR_DEPARTMENT_OTHER): Payer: Medicare Other

## 2015-12-11 ENCOUNTER — Ambulatory Visit: Payer: Medicare Other | Admitting: Nutrition

## 2015-12-11 ENCOUNTER — Ambulatory Visit (HOSPITAL_BASED_OUTPATIENT_CLINIC_OR_DEPARTMENT_OTHER): Payer: Medicare Other | Admitting: Internal Medicine

## 2015-12-11 ENCOUNTER — Ambulatory Visit (HOSPITAL_BASED_OUTPATIENT_CLINIC_OR_DEPARTMENT_OTHER): Payer: Medicare Other

## 2015-12-11 ENCOUNTER — Encounter: Payer: Self-pay | Admitting: Internal Medicine

## 2015-12-11 VITALS — BP 127/79 | HR 75 | Temp 97.7°F | Resp 18 | Ht 68.0 in | Wt 137.6 lb

## 2015-12-11 DIAGNOSIS — C786 Secondary malignant neoplasm of retroperitoneum and peritoneum: Secondary | ICD-10-CM

## 2015-12-11 DIAGNOSIS — C3492 Malignant neoplasm of unspecified part of left bronchus or lung: Secondary | ICD-10-CM

## 2015-12-11 DIAGNOSIS — C3490 Malignant neoplasm of unspecified part of unspecified bronchus or lung: Secondary | ICD-10-CM

## 2015-12-11 DIAGNOSIS — C3491 Malignant neoplasm of unspecified part of right bronchus or lung: Secondary | ICD-10-CM

## 2015-12-11 DIAGNOSIS — Z79899 Other long term (current) drug therapy: Secondary | ICD-10-CM | POA: Diagnosis not present

## 2015-12-11 DIAGNOSIS — R5382 Chronic fatigue, unspecified: Secondary | ICD-10-CM

## 2015-12-11 DIAGNOSIS — Z5112 Encounter for antineoplastic immunotherapy: Secondary | ICD-10-CM

## 2015-12-11 DIAGNOSIS — C3411 Malignant neoplasm of upper lobe, right bronchus or lung: Secondary | ICD-10-CM

## 2015-12-11 LAB — CBC WITH DIFFERENTIAL/PLATELET
BASO%: 0.8 % (ref 0.0–2.0)
Basophils Absolute: 0 10*3/uL (ref 0.0–0.1)
EOS ABS: 0.3 10*3/uL (ref 0.0–0.5)
EOS%: 6.6 % (ref 0.0–7.0)
HCT: 37.7 % — ABNORMAL LOW (ref 38.4–49.9)
HEMOGLOBIN: 12.8 g/dL — AB (ref 13.0–17.1)
LYMPH%: 14.6 % (ref 14.0–49.0)
MCH: 36.2 pg — ABNORMAL HIGH (ref 27.2–33.4)
MCHC: 34 g/dL (ref 32.0–36.0)
MCV: 106.5 fL — AB (ref 79.3–98.0)
MONO#: 0.7 10*3/uL (ref 0.1–0.9)
MONO%: 13.1 % (ref 0.0–14.0)
NEUT%: 64.9 % (ref 39.0–75.0)
NEUTROS ABS: 3.3 10*3/uL (ref 1.5–6.5)
Platelets: 93 10*3/uL — ABNORMAL LOW (ref 140–400)
RBC: 3.54 10*6/uL — AB (ref 4.20–5.82)
RDW: 13.6 % (ref 11.0–14.6)
WBC: 5.1 10*3/uL (ref 4.0–10.3)
lymph#: 0.7 10*3/uL — ABNORMAL LOW (ref 0.9–3.3)

## 2015-12-11 LAB — COMPREHENSIVE METABOLIC PANEL
ALBUMIN: 3.3 g/dL — AB (ref 3.5–5.0)
ALK PHOS: 328 U/L — AB (ref 40–150)
ALT: 90 U/L — ABNORMAL HIGH (ref 0–55)
ANION GAP: 9 meq/L (ref 3–11)
AST: 52 U/L — AB (ref 5–34)
BUN: 32.8 mg/dL — ABNORMAL HIGH (ref 7.0–26.0)
CALCIUM: 9.4 mg/dL (ref 8.4–10.4)
CO2: 22 mEq/L (ref 22–29)
Chloride: 108 mEq/L (ref 98–109)
Creatinine: 1.3 mg/dL (ref 0.7–1.3)
EGFR: 57 mL/min/{1.73_m2} — AB (ref >90)
Glucose: 122 mg/dl (ref 70–140)
POTASSIUM: 4.5 meq/L (ref 3.5–5.1)
SODIUM: 139 meq/L (ref 136–145)
Total Bilirubin: 2.48 mg/dL — ABNORMAL HIGH (ref 0.20–1.20)
Total Protein: 6.7 g/dL (ref 6.4–8.3)

## 2015-12-11 LAB — TSH: TSH: 3.284 m[IU]/L (ref 0.320–4.118)

## 2015-12-11 MED ORDER — SODIUM CHLORIDE 0.9 % IV SOLN
200.0000 mg | Freq: Once | INTRAVENOUS | Status: AC
  Administered 2015-12-11: 200 mg via INTRAVENOUS
  Filled 2015-12-11: qty 8

## 2015-12-11 MED ORDER — SODIUM CHLORIDE 0.9 % IV SOLN
Freq: Once | INTRAVENOUS | Status: AC
  Administered 2015-12-11: 11:00:00 via INTRAVENOUS

## 2015-12-11 NOTE — Patient Instructions (Signed)
[facility] Discharge Instructions for Patients Receiving Chemotherapy  Today you received the following chemotherapy agents: Keytruda   To help prevent nausea and vomiting after your treatment, we encourage you to take your nausea medication as directed    If you develop nausea and vomiting that is not controlled by your nausea medication, call the clinic.   BELOW ARE SYMPTOMS THAT SHOULD BE REPORTED IMMEDIATELY:  *FEVER GREATER THAN 100.5 F  *CHILLS WITH OR WITHOUT FEVER  NAUSEA AND VOMITING THAT IS NOT CONTROLLED WITH YOUR NAUSEA MEDICATION  *UNUSUAL SHORTNESS OF BREATH  *UNUSUAL BRUISING OR BLEEDING  TENDERNESS IN MOUTH AND THROAT WITH OR WITHOUT PRESENCE OF ULCERS  *URINARY PROBLEMS  *BOWEL PROBLEMS  UNUSUAL RASH Items with * indicate a potential emergency and should be followed up as soon as possible.  Feel free to call the clinic you have any questions or concerns. The clinic phone number is [phone].  Please show the CHEMO ALERT CARD at check-in to the Emergency Department and triage nurse.   

## 2015-12-11 NOTE — Progress Notes (Signed)
Nutrition follow-up completed with patient during infusion for metastatic lung cancer. Currently patient weighs 137.6 pounds improved from 130.3 pounds on August 14. Patient utilizes 3 cans Osmolite 1.5+3 cans TwoCal HN daily. He typically runs continuous tube feeding at 65 mL per hour with a 50 cc per hour water flush. Current tube feeding provides 2490 cal, 104 g protein, 2241 mL free water daily and meets estimated nutrition needs. Patient is seeing a speech therapist who is working with patient to improve swallowing. Patient's wife reports patient can swallow water, applesauce, ice cream, and mandarin oranges, and small bites of chili and BBQ ribs.  Estimated nutrition needs: 2200-2500 calories, 95-105 grams protein, 2.5 L fluid.  Nutrition diagnosis: Malnutrition improved.  Intervention:  Educated patient to continue continuous tube feedings as indicated above. Provided support and encouragement for patient to continue enhancing and improving foods by mouth as allowed by speech therapy Briefly explained how we could transition off some tube feeding as oral intake improves. Goal for weight stabilization.  Monitoring, evaluation, goals: Patient will improve oral intake but continued tube feeding to promote weight maintenance.  Next visit to be scheduled as needed.    **Disclaimer: This note was dictated with voice recognition software. Similar sounding words can inadvertently be transcribed and this note may contain transcription errors which may not have been corrected upon publication of note.**

## 2015-12-11 NOTE — Progress Notes (Signed)
[facility] [phone]   Fax:([phone]) [place], MD [address] [place] [address]  DIAGNOSIS AND DIAGNOSIS: Metastatic non-small cell lung cancer adenocarcinoma diagnosed in September 2010 . PDL 1 expression 50%  PRIOR THERAPY:  #1 status post 6 cycles of systemic chemotherapy with carboplatin, Alimta and Avastin given every 3 weeks last dose given 04/23/2009 with disease stabilization.  #2 status post palliative radiotherapy to the mediastinum under the care of Dr. Pablo Ledger. The patient received a total dose of 3000 cGY and 12 fractions completed 01/21/2009.  #3 Maintenance chemotherapy with Alimta at 500 mg per meter squared and Avastin at 15 mg per kilogram given every 3 weeks status post 35 cycles.  #4 Maintenance chemotherapy with Alimta 500 mg/M2 and Avastin 15 mg/kg every 4 weeks,status post 17 cycles, discontinued secondary to disease progression.  #5 Systemic chemotherapy with carboplatin for AUC of 5, Alimta 500 mg/M2 and Avastin 15 mg/kg every 3 weeks, status post 3 cycles, last cycle was given 12/28/2012. Carboplatin was discontinued starting cycle #2 secondary to hypersensitivity reaction. #6 Maintenance chemotherapy again with Alimta 500 mg/M2 and Avastin 15 mg/kg every 3 weeks, first cycle 01/23/2013. Status post 6 cycles. #7 palliative radiotherapy to the large abdominal mass under the care of Dr. Pablo Ledger. #8  Immunotherapy with Nivolumab 3 MG/KG every 2 weeks. First dose 10/04/2014. Status post 3 cycles discontinued secondary to disease progression. #9. Status post palliative radiotherapy to the progressive retroperitoneal lymph nodes under the care of Dr. Sondra Come.  CURRENT THERAPY: Ketruda 200 mg IV every 3 weeks. First dose on 11/20/2015. Status post one cycle.  CODE STATUS: No CODE BLUE  INTERVAL HISTORY: [name] 70 y.o. male returns to the clinic today for follow-up visit  accompanied by his wife. The patient is feeling much better today. He tolerated the first cycle of immunotherapy with [place] (pembrolizumab) fairly well with no significant adverse effect except for fatigue days after the first cycle. He denied having any significant nausea, vomiting, diarrhea or constipation. He has no chest pain, shortness breath, cough or hemoptysis. He gained few pounds since his last visit. He is here today for evaluation before starting cycle #2 of his immunotherapy.  MEDICAL HISTORY: Past Medical History:  Diagnosis Date  . Abnormality of gait 03/12/2015  . Acute meniscal tear of knee RIGHT KNEE  . ARF (acute renal failure) ([place]) 03/2015   pre-renal patient was septic  . Arthritis RIGHT SHOULDER  . BPH (benign prostatic hypertrophy)   . Dizziness and giddiness 04/23/2014  . 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 HOH (hard of hearing)    bilateral hearing aids  . Hypertension   . Immature cataract BILATERAL  . Non-small cell lung cancer ([place]) DX SEPT 2010  W/ CHEMORADIATION AT THAT TIME -- NOW  W/ METS--  CURRENTLY ON MAINTENANCE CHEMO TX  EVERY 27 DAYS   ONCOLOGIST- DR [facility]- lung cancer with metastasis.  . Orthostatic hypotension    B/P tends to run 110/70 or less -using med to raise B/P.  Marland Kitchen Pneumonia   . Radiation 01/08/09-01/21/09   Mediastinum 30 Gy x 12 fractions  . Radiation 08/19/14-09/04/14   Palliative RT Porta hepatic LN 30 Gy  . Sepsis ([place]) 03/2015  . Shortness of breath dyspnea    with exertion  . Swallowing impairment    "Unable to swallow"  . Tremor 04/23/2014  Right hand    ALLERGIES:  is allergic to fentanyl; augmentin [amoxicillin-pot clavulanate]; levaquin [levofloxacin]; percocet [oxycodone-acetaminophen]; scopolamine; zolpidem tartrate; and carboplatin.  MEDICATIONS:  Current Outpatient Prescriptions  Medication Sig Dispense Refill  . b complex vitamins tablet Take 1 tablet by  mouth daily.    Marland Kitchen dicyclomine (BENTYL) 20 MG tablet Take 20 mg by mouth every 6 (six) hours as needed (for abdominal cramps).    . diphenhydramine-acetaminophen (TYLENOL PM) 25-500 MG TABS tablet Take 1 tablet by mouth at bedtime as needed (for sleep).     . FeFum-FePoly-FA-B Cmp-C-Biot (INTEGRA PLUS) CAPS Give 1 capsule by tube daily.    Marland Kitchen FeFum-FePoly-FA-B Cmp-C-Biot (INTEGRA PLUS) CAPS Take 1 capsule by mouth  every morning (Patient not taking: Reported on 12/03/2015) 60 capsule 2  . HYDROmorphone (DILAUDID) 2 MG tablet Take 2-4 mg by mouth every 4 (four) hours as needed for severe pain.    Marland Kitchen LORazepam (ATIVAN) 1 MG tablet Take 0.5-1 mg by mouth every 4 (four) hours as needed for anxiety (and/or nausea).    . midodrine (PROAMATINE) 5 MG tablet Take 5 mg by mouth 3 (three) times daily with meals.    . Multiple Vitamin (MULTIVITAMIN WITH MINERALS) TABS tablet Take 1 tablet by mouth daily.    . mupirocin ointment (BACTROBAN) 2 % Apply 1 application topically 3 (three) times daily. Pt applies around tube area.  0  . naproxen sodium (ANAPROX) 220 MG tablet Take 220-440 mg by mouth 2 (two) times daily as needed (for pain).     . Nutritional Supplements (FEEDING SUPPLEMENT, OSMOLITE 1.5 CAL,) LIQD Place 237 mLs into feeding tube 3 (three) times daily.     . Nutritional Supplements (TWOCAL HN) LIQD Place 237 mLs into feeding tube 3 (three) times daily.    Marland Kitchen omeprazole (PRILOSEC) 40 MG capsule Take 40 mg by mouth at bedtime.     . ondansetron (ZOFRAN) 8 MG tablet Take 1 tablet (8 mg total) by mouth every 8 (eight) hours as needed for nausea or vomiting. 30 tablet 0  . promethazine (PHENERGAN) 25 MG tablet Take 25 mg by mouth every 6 (six) hours as needed for nausea or vomiting.    . senna (SENOKOT) 8.6 MG tablet Take 1 tablet by mouth daily.     No current facility-administered medications for this visit.     SURGICAL HISTORY:  Past Surgical History:  Procedure Laterality Date  . BILATERAL EAR DRUM  SURGERY  1960'S  . BILE DUCT STENT PLACEMENT    . CATARACT EXTRACTION Bilateral   . ERCP N/A 08/09/2014   Procedure: ENDOSCOPIC RETROGRADE CHOLANGIOPANCREATOGRAPHY (ERCP);  Surgeon: Carol Ada, MD;  Location: [facility]COPY;  Service: Endoscopy;  Laterality: N/A;  . ERCP N/A 05/23/2015   Procedure: ENDOSCOPIC RETROGRADE CHOLANGIOPANCREATOGRAPHY (ERCP);  Surgeon: Carol Ada, MD;  Location: [facility] ENDOSCOPY;  Service: Endoscopy;  Laterality: N/A;  . ERCP N/A 05/30/2015   Procedure: ENDOSCOPIC RETROGRADE CHOLANGIOPANCREATOGRAPHY (ERCP);  Surgeon: Carol Ada, MD;  Location: [facility] ENDOSCOPY;  Service: Endoscopy;  Laterality: N/A;  . ESOPHAGOGASTRODUODENOSCOPY (EGD) WITH PROPOFOL N/A 08/09/2014   Procedure: ESOPHAGOGASTRODUODENOSCOPY (EGD) WITH PROPOFOL;  Surgeon: Carol Ada, MD;  Location: [facility] ENDOSCOPY;  Service: Endoscopy;  Laterality: N/A;  . EUS N/A 08/09/2014   Procedure: UPPER ENDOSCOPIC ULTRASOUND (EUS) RADIAL;  Surgeon: Carol Ada, MD;  Location: [facility] ENDOSCOPY;  Service: Endoscopy;  Laterality: N/A;  . FINE NEEDLE ASPIRATION N/A 08/09/2014   Procedure: FINE NEEDLE ASPIRATION (FNA) LINEAR;  Surgeon: Carol Ada, MD;  Location: [facility] ENDOSCOPY;  Service: Endoscopy;  Laterality: N/A;  . GASTROSTOMY TUBE PLACEMENT  10/2014  . KNEE ARTHROSCOPY  12/21/2011   Procedure: ARTHROSCOPY KNEE;  Surgeon: Tobi Bastos, MD;  Location: [facility];  Service: Orthopedics;  Laterality: Right;  WITH MEDIAL MENISECTOMY  . LARYNGOSCOPY  01/2015 and 04/21/15  . ORIF LEFT RING FINGER  AND REVASCULARIZATION OF RADIAL SIDE  02-16-2007   DEGLOVING INJURY  . [place] cath    . PORTACATH PLACEMENT  Removed 06/20/3015   as of 05-27-15 remains right chest  . ROTATOR CUFF REPAIR  2008   LEFT SHOULDER  . SHOULDER OPEN ROTATOR CUFF REPAIR Right 06/05/2014   Procedure: RIGHT ROTATOR CUFF REPAIR SHOULDER OPEN;  Surgeon: Latanya Maudlin, MD;  Location: [facility] ORS;  Service: Orthopedics;  Laterality: Right;  . TONSILLECTOMY      as child    REVIEW OF SYSTEMS:  A comprehensive review of systems was negative except for: Constitutional: positive for fatigue   PHYSICAL EXAMINATION: General appearance: alert, cooperative, cachectic, fatigued and no distress Head: Normocephalic, without obvious abnormality, atraumatic Neck: no adenopathy, no JVD, supple, symmetrical, trachea midline and thyroid not enlarged, symmetric, no tenderness/mass/nodules Lymph nodes: Cervical, supraclavicular, and axillary nodes normal. Resp: clear to auscultation bilaterally Back: symmetric, no curvature. ROM normal. No CVA tenderness. Cardio: regular rate and rhythm, S1, S2 normal, no murmur, click, rub or gallop GI: soft, non-tender; bowel sounds normal; no masses,  no organomegaly Extremities: extremities normal, atraumatic, no cyanosis or edema Neurologic: Alert and oriented X 3, normal strength and tone. Normal symmetric reflexes. Normal coordination and gait  ECOG PERFORMANCE STATUS: 1 - Symptomatic but completely ambulatory  Blood pressure 127/79, pulse 75, temperature 97.7 F (36.5 C), temperature source Oral, resp. rate 18, height 5' 8"  (1.727 m), weight 137 lb 9.6 oz (62.4 kg), SpO2 99 %.  LABORATORY DATA: Lab Results  Component Value Date   WBC 5.1 12/11/2015   HGB 12.8 (L) 12/11/2015   HCT 37.7 (L) 12/11/2015   MCV 106.5 (H) 12/11/2015   PLT 93 (L) 12/11/2015      Chemistry      Component Value Date/Time   NA 141 11/19/2015 1230   K 4.6 11/19/2015 1230   CL 104 10/26/2015 1715   CL 109 (H) 08/29/2012 0856   CO2 23 11/19/2015 1230   BUN 35.2 (H) 11/19/2015 1230   CREATININE 1.2 11/19/2015 1230      Component Value Date/Time   CALCIUM 10.3 11/19/2015 1230   ALKPHOS 333 (H) 11/19/2015 1230   AST 49 (H) 11/19/2015 1230   ALT 90 (H) 11/19/2015 1230   BILITOT 2.64 (H) 11/19/2015 1230       RADIOGRAPHIC STUDIES: No results found.  ASSESSMENT AND PLAN: This is a very pleasant 70 years old white male with a stage  IV non-small cell lung cancer diagnosed in September 2010 status post several chemotherapy regimens including immunotherapy which was discontinued recently secondary to disease progression. Over the last few months he patient has significant improvement in his condition. He requested reevaluation for treatment. The recent CT scan of the chest, abdomen and pelvis showed response to therapy of the right paratracheal adenopathy but there was progression of the retroperitoneal metastatic disease. The final pathology from the CT-guided core biopsy of the enlarging retroperitoneal lymphadenopathy was consistent with metastatic poorly differentiated carcinoma. Molecular studies were negative for EGFR, ALK and ROS 1. PDL 1 expression is 50%.   He is currently on immunotherapy with Ketruda (pembrolizumab) status post 1 cycle.  He is tolerating the treatment well. I recommended for the patient to proceed with cycle #2 today as a scheduled. I will see him back for follow-up visit in 3 weeks with the start of cycle #3. He was advised to call immediately if he has any concerning symptoms. The patient voices understanding of current disease status and treatment options and is in agreement with the current care plan.  All questions were answered. The patient knows to call the clinic with any problems, questions or concerns. We can certainly see the patient much sooner if necessary.  Disclaimer: This note was dictated with voice recognition software. Similar sounding words can inadvertently be transcribed and may not be corrected upon review.

## 2015-12-11 NOTE — Progress Notes (Signed)
PEr Dr [name] it is okay to treat pt today with [place] and todays labs

## 2015-12-12 ENCOUNTER — Ambulatory Visit: Payer: Medicare Other

## 2015-12-12 VITALS — HR 82

## 2015-12-12 DIAGNOSIS — M6281 Muscle weakness (generalized): Secondary | ICD-10-CM

## 2015-12-12 DIAGNOSIS — R1312 Dysphagia, oropharyngeal phase: Secondary | ICD-10-CM | POA: Diagnosis not present

## 2015-12-12 DIAGNOSIS — R2689 Other abnormalities of gait and mobility: Secondary | ICD-10-CM

## 2015-12-12 NOTE — Therapy (Signed)
[facility] [address] [place], [place], [address] Phone: [phone]   Fax:  [phone]  Physical Therapy Treatment  Patient Details  Name: [name] MRN: [id number] Date of Birth: 10-17-1945 Referring Provider: Dr. [name]  Encounter Date: 12/12/2015      PT End of Session - 12/12/15 1232    Visit Number 3   Number of Visits 9   Date for PT Re-Evaluation 01/17/16   Authorization Type Medicare - GCodes and progress notes required   PT Start Time 1147   PT Stop Time 1227   PT Time Calculation (min) 40 min   Equipment Utilized During Treatment Gait belt  gait belt for 117', but then doffed 2/2 peg tube placement on L abdomen. PT then used min guard to S to ensure safety.   Activity Tolerance Patient tolerated treatment well   Behavior During Therapy [facility] for tasks assessed/performed      Past Medical History:  Diagnosis Date  . Abnormality of gait 03/12/2015  . Acute meniscal tear of knee RIGHT KNEE  . ARF (acute renal failure) ([place]) 03/2015   pre-renal patient was septic  . Arthritis RIGHT SHOULDER  . BPH (benign prostatic hypertrophy)   . Dizziness and giddiness 04/23/2014  . 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 HOH (hard of hearing)    bilateral hearing aids  . Hypertension   . Immature cataract BILATERAL  . Non-small cell lung cancer ([place]) DX SEPT 2010  W/ CHEMORADIATION AT THAT TIME -- NOW  W/ METS--  CURRENTLY ON MAINTENANCE CHEMO TX  EVERY 20 DAYS   ONCOLOGIST- DR [facility]- lung cancer with metastasis.  . Orthostatic hypotension    B/P tends to run 110/70 or less -using med to raise B/P.  Marland Kitchen Pneumonia   . Radiation 01/08/09-01/21/09   Mediastinum 30 Gy x 12 fractions  . Radiation 08/19/14-09/04/14   Palliative RT Porta hepatic LN 30 Gy  . Sepsis ([place]) 03/2015  . Shortness of breath dyspnea    with exertion  . Swallowing  impairment    "Unable to swallow"  . Tremor 04/23/2014   Right hand    Past Surgical History:  Procedure Laterality Date  . BILATERAL EAR DRUM SURGERY  1960'S  . BILE DUCT STENT PLACEMENT    . CATARACT EXTRACTION Bilateral   . ERCP N/A 08/09/2014   Procedure: ENDOSCOPIC RETROGRADE CHOLANGIOPANCREATOGRAPHY (ERCP);  Surgeon: Carol Ada, MD;  Location: [facility]OSCOPY;  Service: Endoscopy;  Laterality: N/A;  . ERCP N/A 05/23/2015   Procedure: ENDOSCOPIC RETROGRADE CHOLANGIOPANCREATOGRAPHY (ERCP);  Surgeon: Carol Ada, MD;  [facility] ENDOSCOPY;  Service: Endoscopy;  Laterality: N/A;  . ERCP N/A 05/30/2015   Procedure: ENDOSCOPIC RETROGRADE CHOLANGIOPANCREATOGRAPHY (ERCP);  Surgeon: Carol Ada, MD;  Location: [facility];  Service: Endoscopy;  Laterality: N/A;  . ESOPHAGOGASTRODUODENOSCOPY (EGD) WITH PROPOFOL N/A 08/09/2014   Procedure: ESOPHAGOGASTRODUODENOSCOPY (EGD) WITH PROPOFOL;  Surgeon: Carol Ada, MD;  [facility] ENDOS[facility];  Service: Endoscopy;  Laterality: N/A;  . EUS N/A 08/09/2014   Procedure: UPPER ENDOSCOPIC ULTRASOUND (EUS) RADIAL;  Surgeon: Carol Ada, MD;  Location: [facility] ENDOSCOPY;  Service: Endoscopy;  Laterality: N/A;  . FINE NEEDLE ASPIRATION N/A 08/09/2014   Procedure: FINE NEEDLE ASPIRATION (FNA) LINEAR;  Surgeon: Carol Ada, MD;  Location: [facility] ENDOSCOPY;  Service: Endoscopy;  Laterality: N/A;  . GASTROSTOMY TUBE PLACEMENT  10/2014  . KNEE ARTHROSCOPY  12/21/2011   Procedure: ARTHROSCOPY KNEE;  Surgeon: Kipp Brood  Gioffre, MD;  Location: [facility];  Service: Orthopedics;  Laterality: Right;  WITH MEDIAL MENISECTOMY  . LARYNGOSCOPY  01/2015 and 04/21/15  . ORIF LEFT RING FINGER  AND REVASCULARIZATION OF RADIAL SIDE  02-16-2007   DEGLOVING INJURY  . [place] cath    . PORTACATH PLACEMENT  Removed 06/20/3015   as of 05-27-15 remains right chest  . ROTATOR CUFF REPAIR  2008   LEFT SHOULDER  . SHOULDER OPEN ROTATOR CUFF REPAIR Right 06/05/2014   Procedure: RIGHT  ROTATOR CUFF REPAIR SHOULDER OPEN;  Surgeon: Latanya Maudlin, MD;  Location[facility];  Service: Orthopedics;  Laterality: Right;  . TONSILLECTOMY     as child    Vitals:   12/12/15 1209  Pulse: 82  SpO2: 98%        Subjective Assessment - 12/12/15 1150    Subjective Pt denied falls or changes since last visit. Pt reports he is walking 6 minutes at home with his wife.    Patient is accompained by: Family member  wife-[name]   Pertinent History metastatic lung cancer, orthostatis, dysphagia, hx of dizziness, R knee OA, hx of L knee menisus tear (per pt), CRF   Patient Stated Goals Get my overall strength built up   Currently in Pain? No/denies                         [facility] Adult PT Treatment/Exercise - 12/12/15 1201      Ambulation/Gait   Ambulation/Gait Yes   Ambulation/Gait Assistance 5: Supervision   Ambulation/Gait Assistance Details Cues to improve L heel strike and proper wt. shifting when traversing inclines/declines. Pt required seated rest breaks after each bout of amb. 2/2 fatigue.    Ambulation Distance (Feet) 1000 Feet  and 53' with rollator, 117' no AD, 117 and 230' c SPC   Assistive device Rollator;Straight cane;None   Gait Pattern Step-through pattern;Decreased stride length;Decreased trunk rotation;Decreased dorsiflexion - left;Decreased dorsiflexion - right   Ambulation Surface Level;Unlevel;Indoor;Outdoor;Paved     Exercises   Exercises Knee/Hip     Knee/Hip Exercises: Machines for Strengthening   Cybex Leg Press 1 rep max at 90lbs. Therefore, pt performed 2x10 reps with BLEs at 70 lbs. (77% of 1 rep max), to improve strength in BLEs. Pt required rest breaks 2/2 fatigue.                 PT Education - 12/12/15 1230    Education provided Yes   Education Details PT reiterated the importance of continuing home walking program at 8 minutes to improve endurance. PT discussed the importance of continuing to use rollator at all times for  safety, and continue gait training with SPC and no AD during PT.    Person(s) Educated Patient;Spouse   Methods Explanation   Comprehension Verbalized understanding          PT Short Term Goals - 12/05/15 1706      PT SHORT TERM GOAL #1   Title Pt will be IND in HEP to improve strength, endurance, and balance. TARGET DATE FOR ALL STGS: 12/16/15   Status New     PT SHORT TERM GOAL #2   Title Perform DGI and write goals prn.    Baseline DGI =13   Status Achieved     PT SHORT TERM GOAL #3   Title Pt will amb. 200' with SPC and S to improve functional mobility, over even terrain.    Status New  PT SHORT TERM GOAL #4   Title Pt will perform TUG with LRAD in </=13.5sec. to decr. falls risk.            PT Long Term Goals - 12/05/15 1253      PT LONG TERM GOAL #1   Title Pt will improve 6MWT distance from 798' to 998' to improve endurance. TARGET DATE FOR ALL LTGS: 01/13/16   Status New     PT LONG TERM GOAL #2   Title Assess ability to amb. on treadmill and write goal if applicable and safe.   Status New     PT LONG TERM GOAL #3   Title Pt will amb. 100' without AD, with S, in order to amb. safely at home.    Status New     PT LONG TERM GOAL #4   Title Pt will amb. 400' over even/paved surfaces with LRAD at MOD I level in order to improve functional mobility.    Status New     PT LONG TERM GOAL #5   Title Assess stair climbing ability and write goals prn.    Status New     PT LONG TERM GOAL #6   Title Pt will incr DGI to > 19/24 to allow pt to ambulate safely at home and in the community.     Baseline 13/24   Status New               Plan - 12/12/15 1233    Clinical Impression Statement Pt demonstrated improved endurance, as he was able to amb. for longer periods of time. Pt demonstrated improved balance as he was able to amb. with SPC and no AD without overt LOB, but did experience incr. postural sway and should continue to amb. with rollator at home  and gait train without AD or SPC at PT only. Continue with POC.    Rehab Potential Good   Clinical Impairments Affecting Rehab Potential Pt has metastatic lung cancer and is currently receiving treatment   PT Frequency 1x / week   PT Duration 8 weeks   PT Treatment/Interventions ADLs/Self Care Home Management;Canalith Repostioning;DME Instruction;Gait training;Stair training;Functional mobility training;Therapeutic activities;Therapeutic exercise;Balance training;Vestibular;Neuromuscular re-education;Patient/family education   PT Next Visit Plan Assess STGs. Continue B LE strengthening exercises, dynamic balance activities, gait with SPC and no AD   PT Home Exercise Plan sit to stands, SLS at counter   Consulted and Agree with Plan of Care Patient;Family member/caregiver   Family Member Consulted wife, [name]      Patient will benefit from skilled therapeutic intervention in order to improve the following deficits and impairments:  Dizziness, Abnormal gait, Decreased mobility, Decreased endurance, Decreased balance, Postural dysfunction, Decreased strength  Visit Diagnosis: Other abnormalities of gait and mobility  Muscle weakness (generalized)     Problem List Patient Active Problem List   Diagnosis Date Noted  . Chronic fatigue 11/10/2015  . Bacteremia due to Klebsiella pneumoniae 04/29/2015  . Infection due to Enterobacter cloacae 04/29/2015  . Hypokalemia 04/29/2015  . Gram-negative bacteremia ([place]) 04/25/2015  . Septic shock ([place]) 04/23/2015  . Malignant neoplasm of lung ([place])   . Sepsis ([place])   . Acute kidney injury ([place])   . Abnormality of gait 03/12/2015  . Lung cancer ([place])   . Protein-calorie malnutrition, severe ([place]) 11/26/2014  . Hypoalbuminemia 11/18/2014  . Leukocytosis 11/18/2014  . Dysphagia 11/18/2014  . Nausea with vomiting 10/03/2014  . Central line complication 03/47/4259  . Anorexia 10/03/2014  . Weight loss  10/03/2014  . Transaminitis 10/03/2014  .  Hyperphosphatemia 10/03/2014  . Encounter for antineoplastic immunotherapy 09/28/2014  . Non-small cell lung cancer ([place]) 09/21/2014  . Abdominal pain   . Fever   . 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 Biliary obstruction   . Fatigue 08/27/2014  . Abdominal pain, acute   . Abdominal mass   . Abdominal pain, generalized 08/07/2014  . CRF (chronic renal failure) 08/07/2014  . Obstructive jaundice 08/07/2014  . Malnutrition of moderate degree ([place]) 08/07/2014  . Obstructive jaundice due to cancer 08/06/2014  . Rotator cuff tear, non-traumatic 06/05/2014  . Dizziness and giddiness 04/23/2014  . Tremor 04/23/2014  . Acute on chronic renal failure ([place]) 05/24/2013  . Pancytopenia ([place]) 05/24/2013  . Osteoarthritis of right knee 12/21/2011  . Meniscus, medial, posterior horn derangement 12/21/2011  . Dyspnea 12/04/2010  . Bronchogenic cancer of left lung ([place]) 12/11/2008  . ARTHRITIS 12/10/2008    Danyetta Gillham L 12/12/2015, 12:36 PM  [facility] [address] [place], [place], [address] Phone: [phone]   Fax:  [phone]  Name: [name] MRN: [id number] Date of Birth: 05-Aug-1945  Geoffry Paradise, PT,DPT 12/12/15 12:36 PM Phone: [phone] Fax: [phone]

## 2015-12-18 ENCOUNTER — Other Ambulatory Visit (HOSPITAL_COMMUNITY): Payer: Self-pay | Admitting: Interventional Radiology

## 2015-12-18 DIAGNOSIS — R633 Feeding difficulties, unspecified: Secondary | ICD-10-CM

## 2015-12-19 ENCOUNTER — Telehealth: Payer: Self-pay | Admitting: *Deleted

## 2015-12-19 ENCOUNTER — Ambulatory Visit (HOSPITAL_BASED_OUTPATIENT_CLINIC_OR_DEPARTMENT_OTHER): Payer: Medicare Other | Admitting: Nurse Practitioner

## 2015-12-19 ENCOUNTER — Ambulatory Visit: Payer: Medicare Other

## 2015-12-19 ENCOUNTER — Telehealth: Payer: Self-pay | Admitting: Internal Medicine

## 2015-12-19 ENCOUNTER — Ambulatory Visit (HOSPITAL_COMMUNITY): Admission: RE | Admit: 2015-12-19 | Payer: Medicare Other | Source: Ambulatory Visit

## 2015-12-19 ENCOUNTER — Ambulatory Visit (HOSPITAL_COMMUNITY)
Admission: RE | Admit: 2015-12-19 | Discharge: 2015-12-19 | Disposition: A | Discharge status: Home / Self Care | Payer: Medicare Other | Source: Ambulatory Visit | Attending: Family Medicine | Admitting: Family Medicine

## 2015-12-19 ENCOUNTER — Telehealth: Payer: Self-pay | Admitting: Medical Oncology

## 2015-12-19 ENCOUNTER — Encounter: Payer: Self-pay | Admitting: Nurse Practitioner

## 2015-12-19 VITALS — BP 118/82 | HR 81 | Temp 97.5°F | Resp 18 | Ht 68.0 in | Wt 137.6 lb

## 2015-12-19 DIAGNOSIS — R1310 Dysphagia, unspecified: Secondary | ICD-10-CM

## 2015-12-19 DIAGNOSIS — M542 Cervicalgia: Secondary | ICD-10-CM | POA: Diagnosis not present

## 2015-12-19 DIAGNOSIS — C3492 Malignant neoplasm of unspecified part of left bronchus or lung: Secondary | ICD-10-CM

## 2015-12-19 DIAGNOSIS — M6281 Muscle weakness (generalized): Secondary | ICD-10-CM

## 2015-12-19 DIAGNOSIS — R1312 Dysphagia, oropharyngeal phase: Secondary | ICD-10-CM | POA: Diagnosis present

## 2015-12-19 DIAGNOSIS — R21 Rash and other nonspecific skin eruption: Secondary | ICD-10-CM | POA: Diagnosis present

## 2015-12-19 DIAGNOSIS — R2689 Other abnormalities of gait and mobility: Secondary | ICD-10-CM

## 2015-12-19 NOTE — Telephone Encounter (Signed)
No 9/22 losr/orders/referrals

## 2015-12-19 NOTE — Assessment & Plan Note (Signed)
.    Patient states that he has developed a rash to his lower back within the last 3-4 days.  He denies any pruritus or pain with the rash.  He has noticed no other rash to any other areas of his body. he also denies any other allergic-type symptoms whatsoever.  Exam today reveals multiple, scattered, flat-headed raised papules to the lower back only.  There is no evidence of infection.  Advised both patient and his wife that patient should try Benadryl 25 mg every 6 hours and Pepcid 20 mg every 12 hours for treatment of the rash.  Patient may also try hydrocortisone to the rash as well.  Discussed with both patient and his wife that Beryle Flock has an approximately 24% risk of rash.  Most likely, the rash is secondary to the immunotherapy.  Reviewed all findings with Dr. Burr Medico; who confirmed that we should hold on any steroids while patient is undergoing immunotherapy if at all possible.  Patient was advised to go directly to the emergency department over the weekend for any worsening symptoms whatsoever.  Advised patient would call and check in with him again on Monday as well.

## 2015-12-19 NOTE — Progress Notes (Signed)
SYMPTOM MANAGEMENT CLINIC    Chief Complaint: Rash, neck pain  HPI:  [name] 70 y.o. male diagnosed with lung cancer.  Currently undergoing keytruda immunotherapy.    No history exists.    Review of Systems  Musculoskeletal: Positive for neck pain.  Skin: Positive for rash. Negative for itching.  All other systems reviewed and are negative.   Past Medical History:  Diagnosis Date  . Abnormality of gait 03/12/2015  . Acute meniscal tear of knee RIGHT KNEE  . ARF (acute renal failure) ([place]) 03/2015   pre-renal patient was septic  . Arthritis RIGHT SHOULDER  . BPH (benign prostatic hypertrophy)   . Dizziness and giddiness 04/23/2014  . 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 HOH (hard of hearing)    bilateral hearing aids  . Hypertension   . Immature cataract BILATERAL  . Non-small cell lung cancer ([place]) DX SEPT 2010  W/ CHEMORADIATION AT THAT TIME -- NOW  W/ METS--  CURRENTLY ON MAINTENANCE CHEMO TX  EVERY 49 DAYS   ONCOLOGIST- DR [facility]- lung cancer with metastasis.  . Orthostatic hypotension    B/P tends to run 110/70 or less -using med to raise B/P.  Marland Kitchen Pneumonia   . Radiation 01/08/09-01/21/09   Mediastinum 30 Gy x 12 fractions  . Radiation 08/19/14-09/04/14   Palliative RT Porta hepatic LN 30 Gy  . Sepsis ([place]) 03/2015  . Shortness of breath dyspnea    with exertion  . Swallowing impairment    "Unable to swallow"  . Tremor 04/23/2014   Right hand    Past Surgical History:  Procedure Laterality Date  . BILATERAL EAR DRUM SURGERY  1960'S  . BILE DUCT STENT PLACEMENT    . CATARACT EXTRACTION Bilateral   . ERCP N/A 08/09/2014   Procedure: ENDOSCOPIC RETROGRADE CHOLANGIOPANCREATOGRAPHY (ERCP);  Surgeon: Carol Ada, MD;  Location: [facility];  Service: Endoscopy;  Laterality: N/A;  . ERCP N/A 05/23/2015   Procedure: ENDOSCOPIC RETROGRADE CHOLANGIOPANCREATOGRAPHY (ERCP);  Surgeon: Carol Ada, MD;   Location[facility] ENDOSCOPY;  Service: Endoscopy;  Laterality: N/A;  . ERCP N/A 05/30/2015   Procedure: ENDOSCOPIC RETROGRADE CHOLANGIOPANCREATOGRAPHY (ERCP);  Surgeon: Carol Ada, MD;  Location: [facility];  Service: Endoscopy;  Laterality: N/A;  . ESOPHAGOGASTRODUODENOSCOPY (EGD) WITH PROPOFOL N/A 08/09/2014   Procedure: ESOPHAGOGASTRODUODENOSCOPY (EGD) WITH PROPOFOL;  Surgeon: Carol Ada, MD;  Location: [facility] ENDOSCOPY;  Service: Endoscopy;  Laterality: N/A;  . EUS N/A 08/09/2014   Procedure: UPPER ENDOSCOPIC ULTRASOUND (EUS) RADIAL;  Surgeon: Carol Ada, MD;  Location: [facility] ENDOSCOPY;  Service: Endoscopy;  Laterality: N/A;  . FINE NEEDLE ASPIRATION N/A 08/09/2014   Procedure: FINE NEEDLE ASPIRATION (FNA) LINEAR;  Surgeon: Carol Ada, MD;  Location: [facility] ENDOSCOPY;  Service: Endoscopy;  Laterality: N/A;  . GASTROSTOMY TUBE PLACEMENT  10/2014  . KNEE ARTHROSCOPY  12/21/2011   Procedure: ARTHROSCOPY KNEE;  Surgeon: Tobi Bastos, MD;  Location: [facility];  Service: Orthopedics;  Laterality: Right;  WITH MEDIAL MENISECTOMY  . LARYNGOSCOPY  01/2015 and 04/21/15  . ORIF LEFT RING FINGER  AND REVASCULARIZATION OF RADIAL SIDE  02-16-2007   DEGLOVING INJURY  . [place] cath    . PORTACATH PLACEMENT  Removed 06/20/3015   as of 05-27-15 remains right chest  . ROTATOR CUFF REPAIR  2008   LEFT SHOULDER  . SHOULDER OPEN ROTATOR CUFF REPAIR Right 06/05/2014   Procedure: RIGHT ROTATOR CUFF REPAIR SHOULDER OPEN;  Surgeon: Latanya Maudlin, MD;  Location: [facility];  Service: Orthopedics;  Laterality: Right;  . TONSILLECTOMY     as child    has Bronchogenic cancer of left lung ([place]); ARTHRITIS; Dyspnea; Osteoarthritis of right knee; Meniscus, medial, posterior horn derangement; Acute on chronic renal failure ([place]); Pancytopenia ([place]); Dizziness and giddiness; Tremor; Rotator cuff tear, non-traumatic; Obstructive jaundice due to cancer; Abdominal pain, generalized; CRF (chronic renal failure); Obstructive  jaundice; Malnutrition of moderate degree ([place]); Abdominal mass; Abdominal pain, acute; Fatigue; Biliary obstruction; Cancer of intra-abdominal lymph nodes, secondary ([place]); Non-small cell lung cancer ([place]); Abdominal pain; Fever; Encounter for antineoplastic immunotherapy; Central line complication; Anorexia; Weight loss; Transaminitis; Hyperphosphatemia; Hypoalbuminemia; Leukocytosis; Dysphagia; Protein-calorie malnutrition, severe ([place]); Lung cancer ([place]); Abnormality of gait; Septic shock ([place]); Malignant neoplasm of lung ([place]); Sepsis ([place]); Acute kidney injury ([place]); Gram-negative bacteremia ([place]); Bacteremia due to Klebsiella pneumoniae; Infection due to Enterobacter cloacae; Hypokalemia; Chronic fatigue; Rash; and Neck pain on his problem list.    is allergic to fentanyl; augmentin [amoxicillin-pot clavulanate]; levaquin [levofloxacin]; percocet [oxycodone-acetaminophen]; scopolamine; zolpidem tartrate; and carboplatin.    Medication List       Accurate as of 12/19/15  6:57 PM. Always use your most recent med list.          b complex vitamins tablet Take 1 tablet by mouth daily.   dicyclomine 20 MG tablet Commonly known as:  BENTYL Take 20 mg by mouth every 6 (six) hours as needed (for abdominal cramps).   diphenhydramine-acetaminophen 25-500 MG Tabs tablet Commonly known as:  TYLENOL PM Take 1 tablet by mouth at bedtime as needed (for sleep).   feeding supplement (OSMOLITE 1.5 CAL) Liqd Place 237 mLs into feeding tube 3 (three) times daily.   TWOCAL HN Liqd Place 237 mLs into feeding tube 3 (three) times daily.   HYDROmorphone 2 MG tablet Commonly known as:  DILAUDID Take 2-4 mg by mouth every 4 (four) hours as needed for severe pain.   INTEGRA PLUS Caps Give 1 capsule by tube daily.   LORazepam 1 MG tablet Commonly known as:  ATIVAN Take 0.5-1 mg by mouth every 4 (four) hours as needed for anxiety (and/or nausea).   midodrine 5 MG tablet Commonly known as:   PROAMATINE Take 5 mg by mouth 3 (three) times daily with meals.   multivitamin with minerals Tabs tablet Take 1 tablet by mouth daily.   mupirocin ointment 2 % Commonly known as:  BACTROBAN Apply 1 application topically 3 (three) times daily. Pt applies around tube area.   naproxen sodium 220 MG tablet Commonly known as:  ANAPROX Take 220-440 mg by mouth 2 (two) times daily as needed (for pain).   omeprazole 40 MG capsule Commonly known as:  PRILOSEC Take 40 mg by mouth at bedtime.   ondansetron 8 MG tablet Commonly known as:  ZOFRAN Take 1 tablet (8 mg total) by mouth every 8 (eight) hours as needed for nausea or vomiting.   promethazine 25 MG tablet Commonly known as:  PHENERGAN Take 25 mg by mouth every 6 (six) hours as needed for nausea or vomiting.   senna 8.6 MG tablet Commonly known as:  SENOKOT Take 1 tablet by mouth daily.        PHYSICAL EXAMINATION  Oncology Vitals 12/19/2015 12/12/2015  Height 173 cm -  Weight 62.415 kg -  Weight (lbs) 137 lbs 10 oz -  BMI (kg/m2) 20.92 kg/m2 -  Temp 97.5 -  Pulse 81 82  Resp 18 -  Resp (Historical as of 10/28/11) - -  SpO2 100 98  BSA (m2) 1.73 m2 -   BP Readings from Last 2 Encounters:  12/19/15 118/82  12/11/15 127/79    Physical Exam  Constitutional: He is oriented to person, place, and time and well-developed, well-nourished, and in no distress.  HENT:  Head: Normocephalic and atraumatic.  Eyes: Conjunctivae and EOM are normal. Pupils are equal, round, and reactive to light.  Neck: No JVD present. No tracheal deviation present. No thyromegaly present.  Mild tenderness to the right lateral neck with palpation and turning his head to the right.  Pulmonary/Chest: Effort normal. No stridor. No respiratory distress.  Musculoskeletal: Normal range of motion. He exhibits no edema.  Lymphadenopathy:    He has no cervical adenopathy.  Neurological: He is alert and oriented to person, place, and time.  Skin: Skin is  warm and dry. Rash noted. No erythema. No pallor.  Multiple, scattered, flat-headed papules to lower back only.  Psychiatric: Affect normal.  Nursing note and vitals reviewed.  Lower back:      LABORATORY DATA:. No visits with results within 3 Day(s) from this visit.  Latest known visit with results is:  Appointment on 12/11/2015  Component Date Value Ref Range Status  . TSH 12/11/2015 3.284  0.320 - 4.118 m(IU)/L Final  . WBC 12/11/2015 5.1  4.0 - 10.3 10e3/uL Final  . NEUT# 12/11/2015 3.3  1.5 - 6.5 10e3/uL Final  . HGB 12/11/2015 12.8* 13.0 - 17.1 g/dL Final  . HCT 12/11/2015 37.7* 38.4 - 49.9 % Final  . Platelets 12/11/2015 93* 140 - 400 10e3/uL Final  . MCV 12/11/2015 106.5* 79.3 - 98.0 fL Final  . MCH 12/11/2015 36.2* 27.2 - 33.4 pg Final  . MCHC 12/11/2015 34.0  32.0 - 36.0 g/dL Final  . RBC 12/11/2015 3.54* 4.20 - 5.82 10e6/uL Final  . RDW 12/11/2015 13.6  11.0 - 14.6 % Final  . lymph# 12/11/2015 0.7* 0.9 - 3.3 10e3/uL Final  . MONO# 12/11/2015 0.7  0.1 - 0.9 10e3/uL Final  . Eosinophils Absolute 12/11/2015 0.3  0.0 - 0.5 10e3/uL Final  . Basophils Absolute 12/11/2015 0.0  0.0 - 0.1 10e3/uL Final  . NEUT% 12/11/2015 64.9  39.0 - 75.0 % Final  . LYMPH% 12/11/2015 14.6  14.0 - 49.0 % Final  . MONO% 12/11/2015 13.1  0.0 - 14.0 % Final  . EOS% 12/11/2015 6.6  0.0 - 7.0 % Final  . BASO% 12/11/2015 0.8  0.0 - 2.0 % Final  . Sodium 12/11/2015 139  136 - 145 mEq/L Final  . Potassium 12/11/2015 4.5  3.5 - 5.1 mEq/L Final  . Chloride 12/11/2015 108  98 - 109 mEq/L Final  . CO2 12/11/2015 22  22 - 29 mEq/L Final  . Glucose 12/11/2015 122  70 - 140 mg/dl Final  . BUN 12/11/2015 32.8* 7.0 - 26.0 mg/dL Final  . Creatinine 12/11/2015 1.3  0.7 - 1.3 mg/dL Final  . Total Bilirubin 12/11/2015 2.48* 0.20 - 1.20 mg/dL Final  . Alkaline Phosphatase 12/11/2015 328* 40 - 150 U/L Final  . AST 12/11/2015 52* 5 - 34 U/L Final  . ALT 12/11/2015 90* 0 - 55 U/L Final  . Total Protein  12/11/2015 6.7  6.4 - 8.3 g/dL Final  . Albumin 12/11/2015 3.3* 3.5 - 5.0 g/dL Final  . Calcium 12/11/2015 9.4  8.4 - 10.4 mg/dL Final  . Anion Gap 12/11/2015 9  3 - 11 mEq/L Final  . EGFR 12/11/2015 57* >90 ml/min/1.73 m2 Final    RADIOGRAPHIC  STUDIES: Dg Op Swallowing Func-medicare/speech Path  Result Date: 12/19/2015 Objective Swallowing Evaluation: Type of Study: MBS-Modified Barium Swallow Study Patient Details Name: Saurav Crumble MRN: [id number] Date of Birth: 09-20-1945 Today's Date: 12/19/2015 Time: SLP Start Time (ACUTE ONLY): 1320-SLP Stop Time (ACUTE ONLY): 1355 SLP Time Calculation (min) (ACUTE ONLY): 35 min Past Medical History: Past Medical History: Diagnosis Date . Abnormality of gait 03/12/2015 . Acute meniscal tear of knee RIGHT KNEE . ARF (acute renal failure) ([place]) 03/2015  pre-renal patient was septic . Arthritis RIGHT SHOULDER . BPH (benign prostatic hypertrophy)  . Dizziness and giddiness 04/23/2014 . Gastrostomy tube dependent (HCC)   Continous feedings per Gastostomy tube"unable to swallow" . HOH (hard of hearing)   bilateral hearing aids . Hypertension  . Immature cataract BILATERAL . Non-small cell lung cancer ([place]) DX SEPT 2010  W/ CHEMORADIATION AT THAT TIME -- NOW  W/ METS--  CURRENTLY ON MAINTENANCE CHEMO TX  EVERY 19 DAYS  ONCOLOGIST- DR [facility]- lung cancer with metastasis. . Orthostatic hypotension   B/P tends to run 110/70 or less -using med to raise B/P. Marland Kitchen Pneumonia  . Radiation 01/08/09-01/21/09  Mediastinum 30 Gy x 12 fractions . Radiation 08/19/14-09/04/14  Palliative RT Porta hepatic LN 30 Gy . Sepsis ([place]) 03/2015 . Shortness of breath dyspnea   with exertion . Swallowing impairment   "Unable to swallow" . Tremor 04/23/2014  Right hand Past Surgical History: Past Surgical History: Procedure Laterality Date . BILATERAL EAR DRUM SURGERY  1960'S . BILE DUCT STENT PLACEMENT   . CATARACT EXTRACTION Bilateral  . ERCP N/A 08/09/2014  Procedure: ENDOSCOPIC RETROGRADE  CHOLANGIOPANCREATOGRAPHY (ERCP);  Surgeon: Carol Ada, MD;  Location: [facility];  Service: Endoscopy;  Laterality: N/A; . ERCP N/A 05/23/2015  Procedure: ENDOSCOPIC RETROGRADE CHOLANGIOPANCREATOGRAPHY (ERCP);  Surgeon: Carol Ada, MD;  Location[facility] END[facility]COPY;  Service: Endoscopy;  Laterality: N/A; . ERCP N/A 05/30/2015  Procedure: ENDOSCOPIC RETROGRADE CHOLANGIOPANCREATOGRAPHY (ERCP);  Surgeon: Carol Ada, MD;  Location: [facility];  Service: Endoscopy;  Laterality: N/A; . ESOPHAGOGASTRODUODENOSCOPY (EGD) WITH PROPOFOL N/A 08/09/2014  Procedure: ESOPHAGOGASTRODUODENOSCOPY (EGD) WITH PROPOFOL;  Surgeon: Carol Ada, MD;  [facility];  Service: Endoscopy;  Laterality: N/A; . EUS N/A 08/09/2014  Procedure: UPPER ENDOSCOPIC ULTRASOUND (EUS) RADIAL;  Surgeon: Carol Ada, MD;  Location[facility];  Service: Endoscopy;  Laterality: N/A; . FINE NEEDLE ASPIRATION N/A 08/09/2014  Procedure: FINE NEEDLE ASPIRATION (FNA) LINEAR;  Surgeon: Carol Ada, MD;  Location[facility];  Service: Endoscopy;  Laterality: N/A; . GASTROSTOMY TUBE PLACEMENT  10/2014 . KNEE ARTHROSCOPY  12/21/2011  Procedure: ARTHROSCOPY KNEE;  Surgeon: Tobi Bastos, MD;  Location: [facility];  Service: Orthopedics;  Laterality: Right;  WITH MEDIAL MENISECTOMY . LARYNGOSCOPY  01/2015 and 04/21/15 . ORIF LEFT RING FINGER  AND REVASCULARIZATION OF RADIAL SIDE  02-16-2007  DEGLOVING INJURY . [place] cath   . PORTACATH PLACEMENT  Removed 06/20/3015  as of 05-27-15 remains right chest . ROTATOR CUFF REPAIR  2008  LEFT SHOULDER . SHOULDER OPEN ROTATOR CUFF REPAIR Right 06/05/2014  Procedure: RIGHT ROTATOR CUFF REPAIR SHOULDER OPEN;  Surgeon: Latanya Maudlin, MD;  Location: [facility];  Service: Orthopedics;  Laterality: Right; . TONSILLECTOMY    as child HPI: 71 yo male with complex medical hx including metastatic lung cancer s/p radiation tx over 7 years - most recent near throat finishing radiation 12/2014.  PMH + for right hand  tremor, hearing loss, pna, DOE, ARF, dysphagia requiring PEG August 2016, EGD 07/2014, left vocal fold paralysis s/p  injection.  Pt uses PEG for nutrition.  His MBS 09/16/2015 showed severe pharyngeal phase dysphagia with gross residuals across all consistencies, laryngeal penetration of residuals, no aspiration- head turn and chin tuck not helpful.  Expectoration cleared residuals from vallecular space.  Pyriform sinus residuals did not clear.  Rec NPO x ice/water  Subjective: pt awake in chair Special Tests: spouse Assessment / Plan / Recommendation CHL IP CLINICAL IMPRESSIONS 12/19/2015 Therapy Diagnosis Moderate pharyngeal phase dysphagia;Severe pharyngeal phase dysphagia;Mild cervical esophageal phase dysphagia Clinical Impression Pt presents with moderate-severe pharyngeal and mild cervical esophageal dysphagia with improvement since prior [facility] June 2017.   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   Pt has improved hyo-laryngeal motility compared to prior exam.  Mildly narrowed space between epiglottis and posterior pharyngeal wall noted due to curvature of spine contributing to epiglottis contacting pharyngeal wall with swallow.  This anatomical/mechanical factor combined with decreased tongue base retraction result in gross vallecular >pyriform sinus residuals most notably with increased viscocity.  Chin tuck posture reattempted without improvement.  Pt does sense large amounts of vallecular residuals and clears with expectoration.  Multiple swallows x 4-5 with each bolus and liquid swallows help to decrease residuals.  Mild laryngeal penetration of sequential boluses of thin liquid barium observed (on 6th swallow) clearing with cued throat clear.  No aspiration observed during MBS. Recommend pt initiate po of softer foods/thin liquids with strict precautions including starting meals with water, conducing multiple swallows, following solids with liquids and frequent expectoration*hocking.  Suspect pt will need  to continue PEG tube feeding for nutritional support.   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  Pt expressed disappointment with lack of improvement in swallowing; SLP offered apologies for his frustration and expressed for chronic dysphagia with XRT side effects.  Offered encouragement for pt to continue exercises to decrease/mitigate further fibrosis.  Impact on safety and function Moderate aspiration risk;Risk for inadequate nutrition/hydration   CHL IP TREATMENT RECOMMENDATION 12/19/2015 Treatment Recommendations Defer treatment plan to f/u with SLP   No flowsheet data found. CHL IP DIET RECOMMENDATION 12/19/2015 SLP Diet Recommendations Dysphagia 3 (Mech soft) solids;Thin liquid Liquid Administration via Cup;Straw Medication Administration Via alternative means Compensations Slow rate;Small sips/bites;Multiple dry swallows after each bite/sip;Follow solids with liquid;Other (Comment)Expectorate after few bites/sips - Postural Changes Remain semi-upright after after feeds/meals (Comment);Seated upright at 90 degrees   CHL IP OTHER RECOMMENDATIONS 12/19/2015 Recommended Consults -- Oral Care Recommendations Oral care BID Other Recommendations --   CHL IP FOLLOW UP RECOMMENDATIONS 12/19/2015 Follow up Recommendations Outpatient SLP   No flowsheet data found.     CHL IP ORAL PHASE 12/19/2015 Oral Phase WFL Oral - Pudding Teaspoon -- Oral - Pudding Cup -- Oral - Honey Teaspoon -- Oral - Honey Cup -- Oral - Nectar Teaspoon -- Oral - Nectar Cup WFL Oral - Nectar Straw -- Oral - Thin Teaspoon -- Oral - Thin Cup WFL Oral - Thin Straw WFL Oral - Puree WFL Oral - Mech Soft -- Oral - Regular WFL Oral - Multi-Consistency -- Oral - Pill -- Oral Phase - Comment --  CHL IP PHARYNGEAL PHASE 12/19/2015 Pharyngeal Phase Impaired Pharyngeal- Pudding Teaspoon -- Pharyngeal -- Pharyngeal- Pudding Cup -- Pharyngeal -- Pharyngeal- Honey Teaspoon -- Pharyngeal -- Pharyngeal- Honey Cup --  Pharyngeal -- Pharyngeal- Nectar Teaspoon -- Pharyngeal -- Pharyngeal- Nectar Cup Reduced epiglottic inversion;Reduced laryngeal elevation;Reduced airway/laryngeal closure;Reduced tongue base retraction;Pharyngeal residue - valleculae;Pharyngeal residue - pyriform Pharyngeal -- Pharyngeal- Nectar Straw --  Pharyngeal -- Pharyngeal- Thin Teaspoon -- Pharyngeal -- Pharyngeal- Thin Cup Reduced epiglottic inversion;Reduced anterior laryngeal mobility;Reduced laryngeal elevation;Reduced airway/laryngeal closure;Reduced tongue base retraction;Pharyngeal residue - valleculae;Pharyngeal residue - pyriform Pharyngeal -- Pharyngeal- Thin Straw Reduced epiglottic inversion;Reduced airway/laryngeal closure;Reduced tongue base retraction;Penetration/Aspiration during swallow;Pharyngeal residue - valleculae;Pharyngeal residue - pyriform Pharyngeal Material enters airway, CONTACTS cords and not ejected out Pharyngeal- Puree Reduced epiglottic inversion;Reduced laryngeal elevation;Reduced airway/laryngeal closure;Reduced tongue base retraction;Pharyngeal residue - valleculae Pharyngeal -- Pharyngeal- Mechanical Soft -- Pharyngeal -- Pharyngeal- Regular Reduced epiglottic inversion;Reduced anterior laryngeal mobility;Reduced laryngeal elevation;Reduced airway/laryngeal closure Pharyngeal -- Pharyngeal- Multi-consistency -- Pharyngeal -- Pharyngeal- Pill -- Pharyngeal -- Pharyngeal Comment --  CHL IP CERVICAL ESOPHAGEAL PHASE 12/19/2015 Cervical Esophageal Phase Impaired Pudding Teaspoon -- Pudding Cup -- Honey Teaspoon -- Honey Cup -- Nectar Teaspoon -- Nectar Cup -- Nectar Straw -- Thin Teaspoon -- Thin Cup -- Thin Straw -- Puree -- Mechanical Soft -- Regular -- Multi-consistency -- Pill -- Cervical Esophageal Comment multiple swallows faciliate clearance CHL IP GO 12/19/2015 Functional Assessment Tool Used mbs, clinical judgement Functional Limitations Swallowing Swallow Current Status (N0272) CL Swallow Goal Status (Z3664) CL  Swallow Discharge Status [phone]) CL Claudie Fisherman, MS [facility] SLP [phone]  CLINICAL DATA:  Dysphagia. Lung cancer with radiation therapy in the neck. EXAM: MODIFIED BARIUM SWALLOW TECHNIQUE: Different consistencies of barium were administered orally to the patient by the Speech Pathologist. Imaging of the pharynx was performed in the lateral projection. FLUOROSCOPY TIME:  Fluoroscopy Time:  2.2 minutes Radiation Exposure Index (if provided by the fluoroscopic device): 8.5 mGy Number of Acquired Spot Images: 0 COMPARISON:  09/16/2015 FINDINGS: Thin liquid- poor epiglottic inversion. Vallecular retention which the patient can sense, an which clears with a hawking maneuver. With repeated thin liquid swallows using a straw, the patient demonstrated laryngeal penetration to the cords. Nectar thick liquid- vallecular retention Pure with cracker- vallecular retention IMPRESSION: 1. Considerable vallecular retention of contrast partially attributable to poor epiglottic inversion. With rapid serial repeated thin liquid swallows using a straw, laryngeal penetration was observed to the cords, but with single swallows we did not observe laryngeal penetration. Please refer to the Speech Pathologists report for complete details and recommendations. Electronically Signed   By: Van Clines M.D.   On: 12/19/2015 14:19    ASSESSMENT/PLAN:    Rash .  Patient states that he has developed a rash to his lower back within the last 3-4 days.  He denies any pruritus or pain with the rash.  He has noticed no other rash to any other areas of his body. he also denies any other allergic-type symptoms whatsoever.  Exam today reveals multiple, scattered, flat-headed raised papules to the lower back only.  There is no evidence of infection.  Advised both patient and his wife that patient should try Benadryl 25 mg every 6 hours and Pepcid 20 mg every 12 hours for treatment of the rash.  Patient may also try  hydrocortisone to the rash as well.  Discussed with both patient and his wife that Beryle Flock has an approximately 24% risk of rash.  Most likely, the rash is secondary to the immunotherapy.  Reviewed all findings with Dr. Burr Medico; who confirmed that we should hold on any steroids while patient is undergoing immunotherapy if at all possible.  Patient was advised to go directly to the emergency department over the weekend for any worsening symptoms whatsoever.  Advised patient would call and check in with him again on Monday as well.  Bronchogenic cancer of left lung [facility]) Patient received cycle 2 of his Keytruda  immunotherapy on 12/11/2015.  He is scheduled to return for labs, visit, his next cycle of immunotherapy on 01/01/2016.  Neck pain Patient states that he awoke with some right-sided neck pain a few days ago.  He denies any known injury or trauma to his neck.  He denies any headaches or other neurological symptoms whatsoever.  Exam today reveals some mild tenderness to the right lateral neck with palpation and movement.  Patient states that he feels that he is having a muscle spasm in the right side of his neck.  He denies any specific cervical spine tenderness.  Patient was also observed with mild decreased range of motion when turning his head to the right.  Patient was advised to try some occasional anti-inflammatories such as ibuprofen to see if this helps.  Also, patient was advised to go directly to the emergency department over the weekend with any worsening symptoms whatsoever.   Patient stated understanding of all instructions; and was in agreement with this plan of care. The patient knows to call the clinic with any problems, questions or concerns.   Total time spent with patient was 25 minutes;  with greater than 75 percent of that time spent in face to face counseling regarding patient's symptoms,  and coordination of care and follow up.  Disclaimer:This dictation was  prepared with Dragon/digital dictation along with [employer]. Any transcriptional errors that result from this process are unintentional.  Drue Second, NP 12/19/2015

## 2015-12-19 NOTE — Assessment & Plan Note (Signed)
Patient received cycle 2 of his Keytruda  immunotherapy on 12/11/2015.  He is scheduled to return for labs, visit, his next cycle of immunotherapy on 01/01/2016.

## 2015-12-19 NOTE — Assessment & Plan Note (Signed)
Patient states that he awoke with some right-sided neck pain a few days ago.  He denies any known injury or trauma to his neck.  He denies any headaches or other neurological symptoms whatsoever.  Exam today reveals some mild tenderness to the right lateral neck with palpation and movement.  Patient states that he feels that he is having a muscle spasm in the right side of his neck.  He denies any specific cervical spine tenderness.  Patient was also observed with mild decreased range of motion when turning his head to the right.  Patient was advised to try some occasional anti-inflammatories such as ibuprofen to see if this helps.  Also, patient was advised to go directly to the emergency department over the weekend with any worsening symptoms whatsoever.

## 2015-12-19 NOTE — Telephone Encounter (Signed)
Wife called stating that patient had developed a rash on the his lower back. It was noticed 4 days ago. Denies any itching. Beryle Flock was given on 9/14. Patient will be at this facility today if need to be seen. Message forwarded to RN.

## 2015-12-19 NOTE — Therapy (Addendum)
[facility] [address] [place], [place], [address] Phone: [phone]   Fax:  [phone]  Physical Therapy Treatment  Patient Details  Name: [name] MRN: [id number] Date of Birth: 29-Jul-1945 Referring Provider: Dr. [name]  Encounter Date: 12/19/2015      PT End of Session - 12/19/15 1242    Visit Number 4   Number of Visits 9   Date for PT Re-Evaluation 01/17/16   Authorization Type Medicare - GCodes and progress notes required   PT Start Time 1149   PT Stop Time 1231   PT Time Calculation (min) 42 min   Equipment Utilized During Treatment --  min guard to S prn, as pt has PEG tube placed on L abdomen   Activity Tolerance Patient tolerated treatment well   Behavior During Therapy [facility] for tasks assessed/performed      Past Medical History:  Diagnosis Date  . Abnormality of gait 03/12/2015  . Acute meniscal tear of knee RIGHT KNEE  . ARF (acute renal failure) ([place]) 03/2015   pre-renal patient was septic  . Arthritis RIGHT SHOULDER  . BPH (benign prostatic hypertrophy)   . Dizziness and giddiness 04/23/2014  . 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 HOH (hard of hearing)    bilateral hearing aids  . Hypertension   . Immature cataract BILATERAL  . Non-small cell lung cancer ([place]) DX SEPT 2010  W/ CHEMORADIATION AT THAT TIME -- NOW  W/ METS--  CURRENTLY ON MAINTENANCE CHEMO TX  EVERY 53 DAYS   ONCOLOGIST- DR [facility]- lung cancer with metastasis.  . Orthostatic hypotension    B/P tends to run 110/70 or less -using med to raise B/P.  Marland Kitchen Pneumonia   . Radiation 01/08/09-01/21/09   Mediastinum 30 Gy x 12 fractions  . Radiation 08/19/14-09/04/14   Palliative RT Porta hepatic LN 30 Gy  . Sepsis ([place]) 03/2015  . Shortness of breath dyspnea    with exertion  . Swallowing impairment    "Unable to swallow"  . Tremor 04/23/2014   Right hand     Past Surgical History:  Procedure Laterality Date  . BILATERAL EAR DRUM SURGERY  1960'S  . BILE DUCT STENT PLACEMENT    . CATARACT EXTRACTION Bilateral   . ERCP N/A 08/09/2014   Procedure: ENDOSCOPIC RETROGRADE CHOLANGIOPANCREATOGRAPHY (ERCP);  Surgeon: Carol Ada, MD;  Location: [facility];  Service: Endoscopy;  Laterality: N/A;  . ERCP N/A 05/23/2015   Procedure: ENDOSCOPIC RETROGRADE CHOLANGIOPANCREATOGRAPHY (ERCP);  Surgeon: Carol Ada, MD;  Location: [facility] ENDOSCOPY;  Service: Endoscopy;  Laterality: N/A;  . ERCP N/A 05/30/2015   Procedure: ENDOSCOPIC RETROGRADE CHOLANGIOPANCREATOGRAPHY (ERCP);  Surgeon: Carol Ada, MD;  Location: [facility];  Service: Endoscopy;  Laterality: N/A;  . ESOPHAGOGASTRODUODENOSCOPY (EGD) WITH PROPOFOL N/A 08/09/2014   Procedure: ESOPHAGOGASTRODUODENOSCOPY (EGD) WITH PROPOFOL;  Surgeon: Carol Ada, MD;  Location: [facility] ENDOSCOPY;  Service: Endoscopy;  Laterality: N/A;  . EUS N/A 08/09/2014   Procedure: UPPER ENDOSCOPIC ULTRASOUND (EUS) RADIAL;  Surgeon: Carol Ada, MD;  Location: [facility] ENDOSCOPY;  Service: Endoscopy;  Laterality: N/A;  . FINE NEEDLE ASPIRATION N/A 08/09/2014   Procedure: FINE NEEDLE ASPIRATION (FNA) LINEAR;  Surgeon: Carol Ada, MD;  Location: [facility] ENDOSCOPY;  Service: Endoscopy;  Laterality: N/A;  . GASTROSTOMY TUBE PLACEMENT  10/2014  . KNEE ARTHROSCOPY  12/21/2011   Procedure: ARTHROSCOPY KNEE;  Surgeon: Tobi Bastos, MD;  Location: [facility];  Service: Orthopedics;  Laterality: Right;  WITH MEDIAL MENISECTOMY  . LARYNGOSCOPY  01/2015 and 04/21/15  . ORIF LEFT RING FINGER  AND REVASCULARIZATION OF RADIAL SIDE  02-16-2007   DEGLOVING INJURY  . [place] cath    . PORTACATH PLACEMENT  Removed 06/20/3015   as of 05-27-15 remains right chest  . ROTATOR CUFF REPAIR  2008   LEFT SHOULDER  . SHOULDER OPEN ROTATOR CUFF REPAIR Right 06/05/2014   Procedure: RIGHT ROTATOR CUFF REPAIR SHOULDER OPEN;  Surgeon: Latanya Maudlin, MD;   Location: [facility];  Service: Orthopedics;  Laterality: Right;  . TONSILLECTOMY     as child    There were no vitals filed for this visit.      Subjective Assessment - 12/19/15 1153    Subjective Pt denied falls since last visit. Pt has a swallowing test later this afternoon. Pt reported he woke up a few days ago with a sore neck.  Pt reports heat reduces pain. Pt bought a SPC and uses it in the house, and used the walker to get the driveway and the cane on the street but his hand got tired of holding the cane.    Patient is accompained by: Family member  [name]-wife   Pertinent History metastatic lung cancer, orthostatis, dysphagia, hx of dizziness, R knee OA, hx of L knee menisus tear (per pt), CRF   Patient Stated Goals Get my overall strength built up   Currently in Pain? Yes   Pain Score --  7-8/10   Pain Location Neck   Pain Orientation Right   Pain Descriptors / Indicators Sharp   Pain Type Acute pain   Pain Onset In the past 7 days   Pain Frequency Intermittent   Aggravating Factors  moving/turning neck to the R side   Pain Relieving Factors rest and heat                         [facility] Adult PT Treatment/Exercise - 12/19/15 1201      Ambulation/Gait   Ambulation/Gait Yes   Ambulation/Gait Assistance 6: Modified independent (Device/Increase time)   Ambulation/Gait Assistance Details No LOB and demonstrated proper technique/sequencing.    Ambulation Distance (Feet) 230 Feet, 75'x3   Assistive device Straight cane   Gait Pattern Step-through pattern;Decreased stride length;Decreased trunk rotation;Decreased dorsiflexion - left;Decreased dorsiflexion - right   Ambulation Surface Level;Indoor     Standardized Balance Assessment   Standardized Balance Assessment Timed Up and Go Test;Dynamic Gait Index     Dynamic Gait Index   Level Surface Mild Impairment   Change in Gait Speed Mild Impairment   Gait with Horizontal Head Turns Mild Impairment   Gait with  Vertical Head Turns Mild Impairment   Gait and Pivot Turn Mild Impairment   Step Over Obstacle Moderate Impairment   Step Around Obstacles Mild Impairment   Steps Mild Impairment   Total Score 15     Timed Up and Go Test   TUG Normal TUG   Normal TUG (seconds) 12.9  with SPC      Therex: Pt performed previous and new HEP with cues for frequency and new technique. Performed with S for safety. Please see pt instructions for details.     Self Care:     PT Education - 12/19/15 1241    Education provided Yes   Education Details PT reviewed previous HEP and progressed as tolerated. PT also added wall squats to HEP to improve strength and endurance. PT discussed  the importance of continuing HEP at home, including walking program.  PT discussed pt continuing to use heat to decr. Neck pain, and to make sure he has barriers between heat source and neck to ensure skin integrity. PT also educated pt that PT would assess neck next session if pain persists, as it could limit proper posture and ability to perform head turns.   Person(s) Educated Patient;Spouse   Methods Explanation;Demonstration;Verbal cues;Handout   Comprehension Verbalized understanding;Returned demonstration          PT Short Term Goals - 12/19/15 1245      PT SHORT TERM GOAL #1   Title Pt will be IND in HEP to improve strength, endurance, and balance. TARGET DATE FOR ALL STGS: 12/16/15   Status Partially Met     PT SHORT TERM GOAL #2   Title Perform DGI and write goals prn.    Baseline DGI =13   Status Achieved     PT SHORT TERM GOAL #3   Title Pt will amb. 200' with SPC and S to improve functional mobility, over even terrain.    Status Achieved     PT SHORT TERM GOAL #4   Title Pt will perform TUG with LRAD in </=13.5sec. to decr. falls risk.    Status Achieved           PT Long Term Goals - 12/05/15 1253      PT LONG TERM GOAL #1   Title Pt will improve 6MWT distance from 798' to 998' to improve  endurance. TARGET DATE FOR ALL LTGS: 01/13/16   Status New     PT LONG TERM GOAL #2   Title Assess ability to amb. on treadmill and write goal if applicable and safe.   Status New     PT LONG TERM GOAL #3   Title Pt will amb. 100' without AD, with S, in order to amb. safely at home.    Status New     PT LONG TERM GOAL #4   Title Pt will amb. 400' over even/paved surfaces with LRAD at MOD I level in order to improve functional mobility.    Status New     PT LONG TERM GOAL #5   Title Assess stair climbing ability and write goals prn.    Status New     PT LONG TERM GOAL #6   Title Pt will incr DGI to > 19/24 to allow pt to ambulate safely at home and in the community.     Baseline 13/24   Status New               Plan - 12/19/15 1157    Clinical Impression Statement Pt demonstrated progress, as he met STGs 3 and 4. Pt partially met LTG 1 (HEP), as he required cues on frequency. Pt's DGI score still indicates pt is at risk for falls, but has improved since baseline DGI score.  Continue with POC.    Rehab Potential Good   Clinical Impairments Affecting Rehab Potential Pt has metastatic lung cancer and is currently receiving treatment   PT Frequency 1x / week   PT Duration 8 weeks   PT Treatment/Interventions ADLs/Self Care Home Management;Canalith Repostioning;DME Instruction;Gait training;Stair training;Functional mobility training;Therapeutic activities;Therapeutic exercise;Balance training;Vestibular;Neuromuscular re-education;Patient/family education   PT Next Visit Plan Assess R side neck pain prn. Continue B LE strengthening exercises, dynamic balance activities, gait with SPC and no AD   PT Home Exercise Plan sit to stands, SLS at counter, wall squat  and walking program   Consulted and Agree with Plan of Care Patient;Family member/caregiver   Family Member Consulted wife, [name]      Patient will benefit from skilled therapeutic intervention in order to improve the  following deficits and impairments:  Dizziness, Abnormal gait, Decreased mobility, Decreased endurance, Decreased balance, Postural dysfunction, Decreased strength  Visit Diagnosis: Other abnormalities of gait and mobility  Muscle weakness (generalized)     Problem List Patient Active Problem List   Diagnosis Date Noted  . Chronic fatigue 11/10/2015  . Bacteremia due to Klebsiella pneumoniae 04/29/2015  . Infection due to Enterobacter cloacae 04/29/2015  . Hypokalemia 04/29/2015  . Gram-negative bacteremia ([place]) 04/25/2015  . Septic shock ([place]) 04/23/2015  . Malignant neoplasm of lung ([place])   . Sepsis ([place])   . Acute kidney injury ([place])   . Abnormality of gait 03/12/2015  . Lung cancer ([place])   . Protein-calorie malnutrition, severe ([place]) 11/26/2014  . Hypoalbuminemia 11/18/2014  . Leukocytosis 11/18/2014  . Dysphagia 11/18/2014  . Nausea with vomiting 10/03/2014  . Central line complication 62/69/4854  . Anorexia 10/03/2014  . Weight loss 10/03/2014  . Transaminitis 10/03/2014  . Hyperphosphatemia 10/03/2014  . Encounter for antineoplastic immunotherapy 09/28/2014  . Non-small cell lung cancer ([place]) 09/21/2014  . Abdominal pain   . Fever   . 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 Biliary obstruction   . Fatigue 08/27/2014  . Abdominal pain, acute   . Abdominal mass   . Abdominal pain, generalized 08/07/2014  . CRF (chronic renal failure) 08/07/2014  . Obstructive jaundice 08/07/2014  . Malnutrition of moderate degree ([place]) 08/07/2014  . Obstructive jaundice due to cancer 08/06/2014  . Rotator cuff tear, non-traumatic 06/05/2014  . Dizziness and giddiness 04/23/2014  . Tremor 04/23/2014  . Acute on chronic renal failure ([place]) 05/24/2013  . Pancytopenia ([place]) 05/24/2013  . Osteoarthritis of right knee 12/21/2011  . Meniscus, medial, posterior horn derangement 12/21/2011  . Dyspnea 12/04/2010  . Bronchogenic cancer of left lung ([place])  12/11/2008  . ARTHRITIS 12/10/2008    Miller,Jennifer L 12/19/2015, 12:48 PM  [facility] [address] [place], [place], [address] Phone: [phone]   Fax:  [phone]  Name: Peterson Mathey MRN: [id number] Date of Birth: 1945/04/30  Geoffry Paradise, PT,DPT 12/19/15 12:49 PM Phone: [phone] Fax: [phone]

## 2015-12-19 NOTE — Telephone Encounter (Addendum)
4 days ago - dime sized blotches appeared on his back, like welts. Approx 20 welts. Denies itching,fever,oozing an they are not bothering him. At the same time he reported a stiff neck.Wife denies any insect bite -  S/P 2 cycles of immunotherapy keytruda-last one 9/14. [facility] appt today.

## 2015-12-19 NOTE — Patient Instructions (Addendum)
Advance Knee Strengthener    Leaning on wall, bend knees and slowly lower body until legs form a 45 angle. Hold 5 seconds. Return to standing position by squeezing glute muscles together. Repeat __5__ times. Do __3__ sessions per week.  http://gt2.exer.us/271   Copyright  VHI. All rights reserved.   Walking Program:  Begin walking for exercise for 10 minutes, 1-2 times/day, 5 days/week.   Progress your walking program by adding 1 minute to your routine each week, as tolerated. Be sure to wear good walking shoes, walk in a safe environment and only progress to your tolerance.      SINGLE LIMB STANCE    Stand facing countertop. Fingertip support on counter, as needed. Stand on one leg, attempting to hold for 15 seconds. Repeat with other leg.  Try this exercise 3 times on each side. Repeat _1-2_ times per day.  Copyright  VHI. All rights reserved.   Functional Quadriceps: Sit to Stand    Sit on edge of chair, feet flat on floor. Stand upright, extending knees fully. Repeat _6-7___ times per set. Do __3__ sets per session. Do __1-2__ sessions per day.  Copyright  VHI. All rights reserved.

## 2015-12-22 ENCOUNTER — Telehealth: Payer: Self-pay

## 2015-12-22 NOTE — Telephone Encounter (Signed)
Per cyndee NP per Dr [name]: S/w pt to keep eye on rash, continue with pepcid and benadryl as needed. The neck pain is probably from a mild muscle strain.  Mr [name] also mentioned he is having constipation. He takes 2 senna-s each night. He had to use mag citrate for BM yesterday. He wanted Dr [name] to know in case he wants to change chemo regimen as constipation is SE of Keytruda.

## 2015-12-25 ENCOUNTER — Telehealth: Payer: Self-pay | Admitting: Internal Medicine

## 2015-12-25 NOTE — Telephone Encounter (Signed)
Spoke with patient confirming next appointments for 10/5 patient will get new schedule 10/5.

## 2015-12-26 ENCOUNTER — Ambulatory Visit: Payer: Medicare Other | Admitting: Speech Pathology

## 2015-12-26 ENCOUNTER — Telehealth: Payer: Self-pay | Admitting: *Deleted

## 2015-12-26 ENCOUNTER — Ambulatory Visit: Payer: Medicare Other | Admitting: Physical Therapy

## 2015-12-26 DIAGNOSIS — R1312 Dysphagia, oropharyngeal phase: Secondary | ICD-10-CM

## 2015-12-26 DIAGNOSIS — R42 Dizziness and giddiness: Secondary | ICD-10-CM

## 2015-12-26 DIAGNOSIS — M6281 Muscle weakness (generalized): Secondary | ICD-10-CM

## 2015-12-26 DIAGNOSIS — R2689 Other abnormalities of gait and mobility: Secondary | ICD-10-CM

## 2015-12-26 DIAGNOSIS — M542 Cervicalgia: Secondary | ICD-10-CM

## 2015-12-26 NOTE — Patient Instructions (Addendum)
    Swallow Precautions:  Warm compress before eating  Start with liquid sips or warm liquid sips  Chew thoroughly   Hard swallow  Multiple dry swallows (3+)  Follow with liquid sips  Frequent "hock" to clear out throat   Do not lay down after you eat - 30 minutes  Good oral care  Soft solids, thin liquids

## 2015-12-26 NOTE — Therapy (Signed)
[facility] [address] [place], [place], [address] Phone: [phone]   Fax:  [phone]  Physical Therapy Treatment  Patient Details  Name: Kaien Pezzullo MRN: [id number] Date of Birth: 1945/07/12 Referring Provider: Dr. Christella Noa  Encounter Date: 12/26/2015      PT End of Session - 12/26/15 1418    Visit Number 5   Number of Visits 9   Date for PT Re-Evaluation 01/17/16   Authorization Type Medicare - GCodes and progress notes required   PT Start Time 1148   PT Stop Time 1230   PT Time Calculation (min) 42 min   Activity Tolerance Patient tolerated treatment well;No increased pain   Behavior During Therapy WFL for tasks assessed/performed      Past Medical History:  Diagnosis Date  . Abnormality of gait 03/12/2015  . Acute meniscal tear of knee RIGHT KNEE  . ARF (acute renal failure) ([place]) 03/2015   pre-renal patient was septic  . Arthritis RIGHT SHOULDER  . BPH (benign prostatic hypertrophy)   . Dizziness and giddiness 04/23/2014  . 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 HOH (hard of hearing)    bilateral hearing aids  . Hypertension   . Immature cataract BILATERAL  . Non-small cell lung cancer ([place]) DX SEPT 2010  W/ CHEMORADIATION AT THAT TIME -- NOW  W/ METS--  CURRENTLY ON MAINTENANCE CHEMO TX  EVERY 7 DAYS   ONCOLOGIST- DR [facility]- lung cancer with metastasis.  . Orthostatic hypotension    B/P tends to run 110/70 or less -using med to raise B/P.  Marland Kitchen Pneumonia   . Radiation 01/08/09-01/21/09   Mediastinum 30 Gy x 12 fractions  . Radiation 08/19/14-09/04/14   Palliative RT Porta hepatic LN 30 Gy  . Sepsis ([place]) 03/2015  . Shortness of breath dyspnea    with exertion  . Swallowing impairment    "Unable to swallow"  . Tremor 04/23/2014   Right hand    Past Surgical History:  Procedure Laterality Date  . BILATERAL EAR DRUM SURGERY   1960'S  . BILE DUCT STENT PLACEMENT    . CATARACT EXTRACTION Bilateral   . ERCP N/A 08/09/2014   Procedure: ENDOSCOPIC RETROGRADE CHOLANGIOPANCREATOGRAPHY (ERCP);  Surgeon: Carol Ada, MD;  Location: [facility] ENDOSCOPY;  Service: Endoscopy;  Laterality: N/A;  . ERCP N/A 05/23/2015   Procedure: ENDOSCOPIC RETROGRADE CHOLANGIOPANCREATOGRAPHY (ERCP);  Surgeon: Carol Ada, MD;  Location: [facility] ENDOSCOPY;  Service: Endoscopy;  Laterality: N/A;  . ERCP N/A 05/30/2015   Procedure: ENDOSCOPIC RETROGRADE CHOLANGIOPANCREATOGRAPHY (ERCP);  Surgeon: Carol Ada, MD;  Location: [facility] ENDOSCOPY;  Service: Endoscopy;  Laterality: N/A;  . ESOPHAGOGASTRODUODENOSCOPY (EGD) WITH PROPOFOL N/A 08/09/2014   Procedure: ESOPHAGOGASTRODUODENOSCOPY (EGD) WITH PROPOFOL;  Surgeon: Carol Ada, MD;  [facility]: [facility] ENDOSCOPY;  Service: Endoscopy;  Laterality: N/A;  . EUS N/A 08/09/2014   Procedure: UPPER ENDOSCOPIC ULTRASOUND (EUS) RADIAL;  Surgeon: Carol Ada, MD;  Location: [facility] ENDOSCOPY;  Service: Endoscopy;  Laterality: N/A;  . FINE NEEDLE ASPIRATION N/A 08/09/2014   Procedure: FINE NEEDLE ASPIRATION (FNA) LINEAR;  Surgeon: Carol Ada, MD;  Location: [facility] ENDOSCOPY;  Service: Endoscopy;  Laterality: N/A;  . GASTROSTOMY TUBE PLACEMENT  10/2014  . KNEE ARTHROSCOPY  12/21/2011   Procedure: ARTHROSCOPY KNEE;  Surgeon: Tobi Bastos, MD;  Location: [facility];  Service: Orthopedics;  Laterality: Right;  WITH MEDIAL MENISECTOMY  . LARYNGOSCOPY  01/2015 and 04/21/15  . ORIF LEFT RING FINGER  AND REVASCULARIZATION OF RADIAL SIDE  02-16-2007   DEGLOVING INJURY  . [place] cath    . PORTACATH PLACEMENT  Removed 06/20/3015   as of 05-27-15 remains right chest  . ROTATOR CUFF REPAIR  2008   LEFT SHOULDER  . SHOULDER OPEN ROTATOR CUFF REPAIR Right 06/05/2014   Procedure: RIGHT ROTATOR CUFF REPAIR SHOULDER OPEN;  Surgeon: Latanya Maudlin, MD;  Location: [facility] ORS;  Service: Orthopedics;  Laterality: Right;  . TONSILLECTOMY     as child     There were no vitals filed for this visit.      Subjective Assessment - 12/26/15 1152    Subjective Pt denies falls and reports no significant changes. Reports ongoing pain in R aspect of neck. States, "I don't really move my head because it makes me dizzy."   Patient is accompained by: Family member  [place] - wife   Pertinent History metastatic lung cancer, orthostatis, dysphagia, hx of dizziness, R knee OA, hx of L knee menisus tear (per pt), CRF   Patient Stated Goals Get my overall strength built up   Currently in Pain? Yes   Pain Score 6    Pain Location Neck   Pain Orientation Right   Pain Descriptors / Indicators Sharp   Pain Type Acute pain   Pain Onset 1 to 4 weeks ago   Pain Frequency Intermittent   Aggravating Factors  moving/ turning neck to R side   Pain Relieving Factors rest and heat   Multiple Pain Sites No            [facility] PT Assessment - 12/26/15 0001      Assessment   Medical Diagnosis Deconditioning   Referring Provider Dr. Christella Noa   Onset Date/Surgical Date 09/27/14     Prior Function   Level of Independence Requires assistive device for independence   Vocation Retired   Leisure play golf again     [employer]   Posture/Postural Control Postural limitations   Postural Limitations Rounded Shoulders;Forward head;Decreased thoracic kyphosis;Posterior pelvic tilt     ROM / Strength   AROM / PROM / Strength PROM     PROM   Overall PROM Comments Central P/A (seated) of cervical spine reveals no significant hypomobility, no reproduction of concordant pain.     Palpation   Palpation comment Palpation of R sternocleidomastoid reveals tenderness to palpation and increased soft tissue restrictions and myofascial adhesions in muscle belly and at proximal insertion.                      [facility] Adult PT Treatment/Exercise - 12/26/15 0001      Exercises   Exercises Other Exercises   Other Exercises  With verbal/demo  cueing from this PT, pt effectively performed seated self-stretch of both sternal and clavicular portions of R SCM 3 x30-sec holds per side.      Manual Therapy   Manual Therapy Soft tissue mobilization;Myofascial release   Soft tissue mobilization Supine, hook lying: STM/MFR at R SCM x10 minutes to address sof tissue restrictions/myofascial adhesions.   Myofascial Release Supine, hook lying, suboccipital release x4 minutes to improve postural alignment.   Other Manual Therapy Contract-relax PNF (4 x3-sec holds each) alternating with prolonged passive stretch (4 x30-sec holds each) of B cervical spine rotation and B lcervical spine  lateral flexion.         Vestibular Treatment/Exercise - 12/26/15 0001      Vestibular Treatment/Exercise   Vestibular Treatment Provided Habituation  Habituation Exercises Seated Horizontal Head Turns     Seated Horizontal Head Turns   Number of Reps  4  2 head turns per direction prior to 3/10 dizzines   Symptom Description  dizziness               PT Education - 12/26/15 1406    Education provided Yes   Education Details HEP provided for neck pain/stiffness. Provided education on how postural alignment impacts neck pain. See Pt Instructions for details.    Person(s) Educated Patient;Spouse   Methods Explanation;Demonstration;Handout;Verbal cues   Comprehension Verbalized understanding;Returned demonstration          PT Short Term Goals - 12/19/15 1245      PT SHORT TERM GOAL #1   Title Pt will be IND in HEP to improve strength, endurance, and balance. TARGET DATE FOR ALL STGS: 12/16/15   Status Partially Met     PT SHORT TERM GOAL #2   Title Perform DGI and write goals prn.    Baseline DGI =13   Status Achieved     PT SHORT TERM GOAL #3   Title Pt will amb. 200' with SPC and S to improve functional mobility, over even terrain.    Status Achieved     PT SHORT TERM GOAL #4   Title Pt will perform TUG with LRAD in </=13.5sec. to  decr. falls risk.    Status Achieved           PT Long Term Goals - 12/26/15 1419      PT LONG TERM GOAL #1   Title Pt will improve 6MWT distance from 798' to 998' to improve endurance. TARGET DATE FOR ALL LTGS: 01/13/16   Status On-going     PT LONG TERM GOAL #2   Title Assess ability to amb. on treadmill and write goal if applicable and safe.   Status On-going     PT LONG TERM GOAL #3   Title Pt will amb. 100' without AD, with S, in order to amb. safely at home.    Status On-going     PT LONG TERM GOAL #4   Title Pt will amb. 400' over even/paved surfaces with LRAD at MOD I level in order to improve functional mobility.    Status On-going     PT LONG TERM GOAL #5   Title Assess stair climbing ability and write goals prn.    Status On-going     Additional Long Term Goals   Additional Long Term Goals Yes     PT LONG TERM GOAL #6   Title Pt will incr DGI to > 19/24 to allow pt to ambulate safely at home and in the community.     Baseline 13/24   Status On-going     PT LONG TERM GOAL #7   Title Pt will engage in all above activities with no more than 2-point increase in cervical spine pain to indicate decreased impact of pain on activity tolerance.     Status New               Plan - 12/26/15 1421    Clinical Impression Statement Pt arrived to session with report of 6/10 pain in R aspect of cervical spine. Further assessment reveals reproduction of concordant pain with activation of R sternocleidomastoid (SCM). Also noted tenderness to palpation and soft tissue restriction/myofascial adhesions at R SCM muscle. Therefore, this session focused on use of manual therapy to address pain/dysfunction and to provide HEP  for cervical spine. Post-session pain was 4/10.  Addd LTG to address said pain.    Rehab Potential Good   Clinical Impairments Affecting Rehab Potential Pt has metastatic lung cancer and is currently receiving treatment   PT Treatment/Interventions  ADLs/Self Care Home Management;Canalith Repostioning;DME Instruction;Gait training;Stair training;Functional mobility training;Therapeutic activities;Therapeutic exercise;Balance training;Vestibular;Neuromuscular re-education;Patient/family education;Passive range of motion;Manual techniques   PT Next Visit Plan Continue B LE strengthening exercises, dynamic balance activities, gait with SPC and no AD   PT Home Exercise Plan sit to stands, SLS at counter, wall squat and walking program; 9/29: initiated HEP to address c-spine pain   Consulted and Agree with Plan of Care Patient;Family member/caregiver   Family Member Consulted wife, [name]      Patient will benefit from skilled therapeutic intervention in order to improve the following deficits and impairments:  Dizziness, Abnormal gait, Decreased mobility, Decreased endurance, Decreased balance, Postural dysfunction, Decreased strength, Pain  Visit Diagnosis: Other abnormalities of gait and mobility - Plan: PT plan of care cert/re-cert  Muscle weakness (generalized) - Plan: PT plan of care cert/re-cert  Dizziness and giddiness - Plan: PT plan of care cert/re-cert  Cervicalgia - Plan: PT plan of care cert/re-cert     Problem List Patient Active Problem List   Diagnosis Date Noted  . Rash 12/19/2015  . Neck pain 12/19/2015  . Chronic fatigue 11/10/2015  . Bacteremia due to Klebsiella pneumoniae 04/29/2015  . Infection due to Enterobacter cloacae 04/29/2015  . Hypokalemia 04/29/2015  . Gram-negative bacteremia ([place]) 04/25/2015  . Septic shock ([place]) 04/23/2015  . Malignant neoplasm of lung ([place])   . Sepsis ([place])   . Acute kidney injury ([place])   . Abnormality of gait 03/12/2015  . Lung cancer ([place])   . Protein-calorie malnutrition, severe ([place]) 11/26/2014  . Hypoalbuminemia 11/18/2014  . Leukocytosis 11/18/2014  . Dysphagia 11/18/2014  . Central line complication 22/97/9892  . Anorexia 10/03/2014  . Weight loss 10/03/2014  .  Transaminitis 10/03/2014  . Hyperphosphatemia 10/03/2014  . Encounter for antineoplastic immunotherapy 09/28/2014  . Non-small cell lung cancer ([place]) 09/21/2014  . Abdominal pain   . Fever   . 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 Biliary obstruction   . Fatigue 08/27/2014  . Abdominal pain, acute   . Abdominal mass   . Abdominal pain, generalized 08/07/2014  . CRF (chronic renal failure) 08/07/2014  . Obstructive jaundice 08/07/2014  . Malnutrition of moderate degree ([place]) 08/07/2014  . Obstructive jaundice due to cancer 08/06/2014  . Rotator cuff tear, non-traumatic 06/05/2014  . Dizziness and giddiness 04/23/2014  . Tremor 04/23/2014  . Acute on chronic renal failure ([place]) 05/24/2013  . Pancytopenia ([place]) 05/24/2013  . Osteoarthritis of right knee 12/21/2011  . Meniscus, medial, posterior horn derangement 12/21/2011  . Dyspnea 12/04/2010  . Bronchogenic cancer of left lung ([place]) 12/11/2008  . ARTHRITIS 12/10/2008    Billie Ruddy, PT, DPT [facility] [address] [place], [place], [address] Phone: [phone]   Fax:  [phone] 12/26/15, 2:29 PM  Name: Antonious Omahoney MRN: [id number] Date of Birth: 03-21-1946

## 2015-12-26 NOTE — Telephone Encounter (Signed)
Wife of patient called stating that the benadryl that he would like an alternative to the benadryl that is being used due to the drowsiness of the benadryl. Per Selena Lesser NP note, patient can try hydrocortisone cream for the lower back rash. Patient wife informed and verbalized understanding.

## 2015-12-26 NOTE — Patient Instructions (Addendum)
    In seated, place right hand on right shoulder (as pictured). - Bring your LEFT ear toward your LEFT shoulder. - Then, turn your head as though you're looking over your LEFT shoulder. - You should feel a gentle stretch in the front/right side of your neck (the muscle that has been painful). - Hold for 30 seconds. Do this stretch 4 times per day on the RIGHT side.    In seated, hold onto the surface you're sitting on with your RIGHT hand. - Turn your head to the right and upwards, as though you're looking toward the back right corner of the room. - You should feel a gentle stretch in the front/right side of your neck (the muscle that has been painful). - Hold for 30 seconds. Do this stretch 4 times per day on the RIGHT side.  Head Motion: Side to Side    Sitting, tilt head down slightly, slowly move head from right to left 2 times per direction with your eyes open. Rest until symptoms subside. Then, repeat 2 additional head turns in each direction. Rest. Repeat __5__ times per session. Do __2___ sessions per day.

## 2015-12-26 NOTE — Therapy (Signed)
[facility] [address] [place], [place], [address] Phone: [phone]   Fax:  [phone]  Speech Language Pathology Treatment  Patient Details  Name: [name] MRN: [id number] Date of Birth: 1945/06/25 Referring Provider: Dr. [name]  Encounter Date: 12/26/2015      End of Session - 12/26/15 1335    Visit Number 10   Number of Visits 17   Date for SLP Re-Evaluation 01/30/16   SLP Start Time 0932   SLP Stop Time  1016   SLP Time Calculation (min) 44 min   Activity Tolerance Patient tolerated treatment well      Past Medical History:  Diagnosis Date  . Abnormality of gait 03/12/2015  . Acute meniscal tear of knee RIGHT KNEE  . ARF (acute renal failure) ([place]) 03/2015   pre-renal patient was septic  . Arthritis RIGHT SHOULDER  . BPH (benign prostatic hypertrophy)   . Dizziness and giddiness 04/23/2014  . 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 HOH (hard of hearing)    bilateral hearing aids  . Hypertension   . Immature cataract BILATERAL  . Non-small cell lung cancer ([place]) DX SEPT 2010  W/ CHEMORADIATION AT THAT TIME -- NOW  W/ METS--  CURRENTLY ON MAINTENANCE CHEMO TX  EVERY 18 DAYS   ONCOLOGIST- DR [facility]- lung cancer with metastasis.  . Orthostatic hypotension    B/P tends to run 110/70 or less -using med to raise B/P.  Marland Kitchen Pneumonia   . Radiation 01/08/09-01/21/09   Mediastinum 30 Gy x 12 fractions  . Radiation 08/19/14-09/04/14   Palliative RT Porta hepatic LN 30 Gy  . Sepsis ([place]) 03/2015  . Shortness of breath dyspnea    with exertion  . Swallowing impairment    "Unable to swallow"  . Tremor 04/23/2014   Right hand    Past Surgical History:  Procedure Laterality Date  . BILATERAL EAR DRUM SURGERY  1960'S  . BILE DUCT STENT PLACEMENT    . CATARACT EXTRACTION Bilateral   . ERCP N/A 08/09/2014   Procedure: ENDOSCOPIC RETROGRADE  CHOLANGIOPANCREATOGRAPHY (ERCP);  Surgeon: Carol Ada, MD;  Location: [facility];  Service: Endoscopy;  Laterality: N/A;  . ERCP N/A 05/23/2015   Procedure: ENDOSCOPIC RETROGRADE CHOLANGIOPANCREATOGRAPHY (ERCP);  Surgeon: Carol Ada, MD;  Location: [facility] ENDOSCOPY;  Service: Endoscopy;  Laterality: N/A;  . ERCP N/A 05/30/2015   Procedure: ENDOSCOPIC RETROGRADE CHOLANGIOPANCREATOGRAPHY (ERCP);  Surgeon: Carol Ada, MD;  Location: [facility]OSCOPY;  Service: Endoscopy;  Laterality: N/A;  . ESOPHAGOGASTRODUODENOSCOPY (EGD) WITH PROPOFOL N/A 08/09/2014   Procedure: ESOPHAGOGASTRODUODENOSCOPY (EGD) WITH PROPOFOL;  Surgeon: Carol Ada, MD;  [facility];  Service: Endoscopy;  Laterality: N/A;  . EUS N/A 08/09/2014   Procedure: UPPER ENDOSCOPIC ULTRASOUND (EUS) RADIAL;  Surgeon: Carol Ada, MD;  Location[facility] ENDOSCOPY;  Service: Endoscopy;  Laterality: N/A;  . FINE NEEDLE ASPIRATION N/A 08/09/2014   Procedure: FINE NEEDLE ASPIRATION (FNA) LINEAR;  Surgeon: Carol Ada, MD;  Location[facility]OS[facility];  Service: Endoscopy;  Laterality: N/A;  . GASTROSTOMY TUBE PLACEMENT  10/2014  . KNEE ARTHROSCOPY  12/21/2011   Procedure: ARTHROSCOPY KNEE;  Surgeon: Tobi Bastos, MD;  Location: [facility];  Service: Orthopedics;  Laterality: Right;  WITH MEDIAL MENISECTOMY  . LARYNGOSCOPY  01/2015 and 04/21/15  . ORIF LEFT RING FINGER  AND REVASCULARIZATION OF RADIAL SIDE  02-16-2007   DEGLOVING INJURY  . [place] cath    . PORTACATH  PLACEMENT  Removed 06/20/3015   as of 05-27-15 remains right chest  . ROTATOR CUFF REPAIR  2008   LEFT SHOULDER  . SHOULDER OPEN ROTATOR CUFF REPAIR Right 06/05/2014   Procedure: RIGHT ROTATOR CUFF REPAIR SHOULDER OPEN;  Surgeon: Latanya Maudlin, MD;  Location: [facility];  Service: Orthopedics;  Laterality: Right;  . TONSILLECTOMY     as child    There were no vitals filed for this visit.             ADULT SLP TREATMENT - 12/26/15 1011      General  Information   Behavior/Cognition Alert;Cooperative;Pleasant mood     Treatment Provided   Treatment provided Dysphagia     Dysphagia Treatment   Temperature Spikes Noted No   Respiratory Status Room air   Treatment Methods Therapeutic exercise;Skilled observation;Patient/caregiver education   Patient observed directly with PO's Yes   Type of PO's observed Dysphagia 3 (soft);Thin liquids   Feeding Able to feed self   Other treatment/comments Reviewed results of swallow study with pt and spouse. Trained pt on swallow precautions - pt demonstrated initiatinng with liquid, hard swallow, multiple dry swallows and liquid wash with mod I after training. Instructed pt and spouse on exercises pt is to continue at home and on diet recommendations. Pt and spouse demonstrated and verbalized good comprehension of this.      Pain Assessment   Pain Assessment No/denies pain     Assessment / Recommendations / Plan   Plan Discharge SLP treatment due to (comment)     Progression Toward Goals   Progression toward goals Goals met, education completed, patient discharged from SLP          SLP Education - 12/26/15 1018    Education provided Yes   Education Details Modified HEP, swallow precautions, s/s of aspiration pna, diet modifications, results of MBSS   Person(s) Educated Patient;Spouse   Methods Explanation;Demonstration;Verbal cues;Handout   Comprehension Verbalized understanding;Returned demonstration     SPEECH THERAPY DISCHARGE SUMMARY  Visits from Start of Care: 10  Current functional level related to goals / functional outcomes: See goals below   Remaining deficits: Moderate pharyngeal dysphagia, continue tube feed to supplement PO   Education / Equipment: HEP, diet modifications, swallowing precautions, results of MBSS  Plan: Patient agrees to discharge.  Patient goals were met. Patient is being discharged due to meeting the stated rehab goals.  ?????          SLP Short  Term Goals - 12/26/15 1334      SLP SHORT TERM GOAL #1   Title Pt will perform dysphagia HEP with occasional min A   Status Achieved     SLP SHORT TERM GOAL #2   Title Pt will demonstrate/verbalize precautions for free water protocol with occasional min A   Status Achieved          SLP Long Term Goals - 12/26/15 1334      SLP LONG TERM GOAL #1   Title Pt will perform swallow HEP with rare min A over 4 sessions   Status Achieved     SLP LONG TERM GOAL #2   Title Pt will demonstrate clinical gains/readiness for repeat objective swallowing assessment prior to initiation of PO   Status Achieved     SLP LONG TERM GOAL #3   Title pt will tell SLP three signs/symptoms aspiratoin PNA   Time 4   Period Weeks   Status Achieved     SLP LONG TERM  GOAL #4   Title pt will tell SLP 3-4 options of foods to eat when transitioning back to PO diet.   Time 4   Period Weeks   Status Achieved          Plan - 01/02/2016 1334    Clinical Impression Statement Pt and spouse understand results of repeat MBSS. Pt demonstrated complex swallow precautions with mod I. They both verbalize s/s of aspiration pna. I recommend pt be d/c'd from ST at this time as goals met and education complete. Pt and spouse are in agreement. Plan to continue tube feeds to supplement PO due to ongoing dysphagia, however pt may take small amounts of soft solid (Dys 3) and thin liquids at home with strict following of swallow precautions       Patient will benefit from skilled therapeutic intervention in order to improve the following deficits and impairments:   Dysphagia, oropharyngeal phase      G-Codes - Jan 02, 2016 1336    Functional Assessment Tool Used NOMS, MBSS   Functional Limitations Swallowing   Swallow Goal Status ([id number]) At least 60 percent but less than 80 percent impaired, limited or restricted   Swallow Discharge Status [phone]) At least 60 percent but less than 80 percent impaired, limited or restricted       Problem List Patient Active Problem List   Diagnosis Date Noted  . Rash 12/19/2015  . Neck pain 12/19/2015  . Chronic fatigue 11/10/2015  . Bacteremia due to Klebsiella pneumoniae 04/29/2015  . Infection due to Enterobacter cloacae 04/29/2015  . Hypokalemia 04/29/2015  . Gram-negative bacteremia ([place]) 04/25/2015  . Septic shock ([place]) 04/23/2015  . Malignant neoplasm of lung ([place])   . Sepsis ([place])   . Acute kidney injury ([place])   . Abnormality of gait 03/12/2015  . Lung cancer ([place])   . Protein-calorie malnutrition, severe ([place]) 11/26/2014  . Hypoalbuminemia 11/18/2014  . Leukocytosis 11/18/2014  . Dysphagia 11/18/2014  . Central line complication 09/62/8366  . Anorexia 10/03/2014  . Weight loss 10/03/2014  . Transaminitis 10/03/2014  . Hyperphosphatemia 10/03/2014  . Encounter for antineoplastic immunotherapy 09/28/2014  . Non-small cell lung cancer ([place]) 09/21/2014  . Abdominal pain   . Fever   . 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 Biliary obstruction   . Fatigue 08/27/2014  . Abdominal pain, acute   . Abdominal mass   . Abdominal pain, generalized 08/07/2014  . CRF (chronic renal failure) 08/07/2014  . Obstructive jaundice 08/07/2014  . Malnutrition of moderate degree ([place]) 08/07/2014  . Obstructive jaundice due to cancer 08/06/2014  . Rotator cuff tear, non-traumatic 06/05/2014  . Dizziness and giddiness 04/23/2014  . Tremor 04/23/2014  . Acute on chronic renal failure ([place]) 05/24/2013  . Pancytopenia ([place]) 05/24/2013  . Osteoarthritis of right knee 12/21/2011  . Meniscus, medial, posterior horn derangement 12/21/2011  . Dyspnea 12/04/2010  . Bronchogenic cancer of left lung ([place]) 12/11/2008  . ARTHRITIS 12/10/2008    Ciarra Braddy, Annye Rusk MS, CCC-SLP 2016/01/02, 1:37 PM  [facility] [address] [place], [place], [address] Phone: [phone]   Fax:   [phone]   Name: [name] MRN: [id number] Date of Birth: [birth year]

## 2015-12-30 ENCOUNTER — Telehealth: Payer: Self-pay | Admitting: Medical Oncology

## 2015-12-30 NOTE — Telephone Encounter (Signed)
Rash is spreading around to back. Per [name] I left message to monitor for now and [name] will evaluate it on thursday

## 2016-01-01 ENCOUNTER — Telehealth: Payer: Self-pay | Admitting: Internal Medicine

## 2016-01-01 ENCOUNTER — Ambulatory Visit (HOSPITAL_BASED_OUTPATIENT_CLINIC_OR_DEPARTMENT_OTHER): Payer: Medicare Other

## 2016-01-01 ENCOUNTER — Ambulatory Visit: Payer: Medicare Other | Admitting: Nutrition

## 2016-01-01 ENCOUNTER — Other Ambulatory Visit (HOSPITAL_BASED_OUTPATIENT_CLINIC_OR_DEPARTMENT_OTHER): Payer: Medicare Other

## 2016-01-01 ENCOUNTER — Encounter: Payer: Self-pay | Admitting: Internal Medicine

## 2016-01-01 ENCOUNTER — Ambulatory Visit (HOSPITAL_BASED_OUTPATIENT_CLINIC_OR_DEPARTMENT_OTHER): Payer: Medicare Other | Admitting: Internal Medicine

## 2016-01-01 VITALS — BP 128/80 | HR 79 | Temp 97.7°F | Resp 18 | Ht 68.0 in | Wt 139.1 lb

## 2016-01-01 DIAGNOSIS — Z23 Encounter for immunization: Secondary | ICD-10-CM | POA: Diagnosis not present

## 2016-01-01 DIAGNOSIS — C786 Secondary malignant neoplasm of retroperitoneum and peritoneum: Secondary | ICD-10-CM

## 2016-01-01 DIAGNOSIS — Z5112 Encounter for antineoplastic immunotherapy: Secondary | ICD-10-CM

## 2016-01-01 DIAGNOSIS — C3492 Malignant neoplasm of unspecified part of left bronchus or lung: Secondary | ICD-10-CM | POA: Diagnosis present

## 2016-01-01 DIAGNOSIS — Z79899 Other long term (current) drug therapy: Secondary | ICD-10-CM

## 2016-01-01 DIAGNOSIS — R5382 Chronic fatigue, unspecified: Secondary | ICD-10-CM

## 2016-01-01 DIAGNOSIS — R21 Rash and other nonspecific skin eruption: Secondary | ICD-10-CM

## 2016-01-01 DIAGNOSIS — R740 Nonspecific elevation of levels of transaminase and lactic acid dehydrogenase [LDH]: Secondary | ICD-10-CM

## 2016-01-01 DIAGNOSIS — R7401 Elevation of levels of liver transaminase levels: Secondary | ICD-10-CM

## 2016-01-01 LAB — COMPREHENSIVE METABOLIC PANEL
ALT: 95 U/L — ABNORMAL HIGH (ref 0–55)
ANION GAP: 10 meq/L (ref 3–11)
AST: 69 U/L — AB (ref 5–34)
Albumin: 3.4 g/dL — ABNORMAL LOW (ref 3.5–5.0)
Alkaline Phosphatase: 360 U/L — ABNORMAL HIGH (ref 40–150)
BUN: 37.6 mg/dL — ABNORMAL HIGH (ref 7.0–26.0)
CALCIUM: 9.6 mg/dL (ref 8.4–10.4)
CHLORIDE: 108 meq/L (ref 98–109)
CO2: 21 mEq/L — ABNORMAL LOW (ref 22–29)
Creatinine: 1.4 mg/dL — ABNORMAL HIGH (ref 0.7–1.3)
EGFR: 52 mL/min/{1.73_m2} — ABNORMAL LOW (ref >90)
Glucose: 105 mg/dl (ref 70–140)
POTASSIUM: 4.9 meq/L (ref 3.5–5.1)
Sodium: 139 mEq/L (ref 136–145)
Total Bilirubin: 2.25 mg/dL — ABNORMAL HIGH (ref 0.20–1.20)
Total Protein: 6.9 g/dL (ref 6.4–8.3)

## 2016-01-01 LAB — CBC WITH DIFFERENTIAL/PLATELET
BASO%: 0.4 % (ref 0.0–2.0)
BASOS ABS: 0 10*3/uL (ref 0.0–0.1)
EOS%: 10.3 % — AB (ref 0.0–7.0)
Eosinophils Absolute: 0.7 10*3/uL — ABNORMAL HIGH (ref 0.0–0.5)
HEMATOCRIT: 37.5 % — AB (ref 38.4–49.9)
HGB: 13 g/dL (ref 13.0–17.1)
LYMPH#: 0.9 10*3/uL (ref 0.9–3.3)
LYMPH%: 12.2 % — ABNORMAL LOW (ref 14.0–49.0)
MCH: 36.1 pg — AB (ref 27.2–33.4)
MCHC: 34.7 g/dL (ref 32.0–36.0)
MCV: 104.2 fL — ABNORMAL HIGH (ref 79.3–98.0)
MONO#: 1.5 10*3/uL — ABNORMAL HIGH (ref 0.1–0.9)
MONO%: 20.1 % — ABNORMAL HIGH (ref 0.0–14.0)
NEUT#: 4.1 10*3/uL (ref 1.5–6.5)
NEUT%: 57 % (ref 39.0–75.0)
PLATELETS: 102 10*3/uL — AB (ref 140–400)
RBC: 3.6 10*6/uL — ABNORMAL LOW (ref 4.20–5.82)
RDW: 13.2 % (ref 11.0–14.6)
WBC: 7.2 10*3/uL (ref 4.0–10.3)

## 2016-01-01 LAB — TSH: TSH: 3.094 m(IU)/L (ref 0.320–4.118)

## 2016-01-01 MED ORDER — SODIUM CHLORIDE 0.9 % IV SOLN
200.0000 mg | Freq: Once | INTRAVENOUS | Status: AC
  Administered 2016-01-01: 200 mg via INTRAVENOUS
  Filled 2016-01-01: qty 8

## 2016-01-01 MED ORDER — INFLUENZA VAC SPLIT QUAD 0.5 ML IM SUSY
0.5000 mL | PREFILLED_SYRINGE | Freq: Once | INTRAMUSCULAR | Status: AC
  Administered 2016-01-01: 0.5 mL via INTRAMUSCULAR
  Filled 2016-01-01: qty 0.5

## 2016-01-01 MED ORDER — SODIUM CHLORIDE 0.9 % IV SOLN
Freq: Once | INTRAVENOUS | Status: AC
  Administered 2016-01-01: 13:00:00 via INTRAVENOUS

## 2016-01-01 NOTE — Patient Instructions (Signed)
[facility] Discharge Instructions for Patients Receiving Chemotherapy  Today you received the following chemotherapy agents:  Keytruda (pembrolizumab)  To help prevent nausea and vomiting after your treatment, we encourage you to take your nausea medication as prescribed.   If you develop nausea and vomiting that is not controlled by your nausea medication, call the clinic.   BELOW ARE SYMPTOMS THAT SHOULD BE REPORTED IMMEDIATELY:  *FEVER GREATER THAN 100.5 F  *CHILLS WITH OR WITHOUT FEVER  NAUSEA AND VOMITING THAT IS NOT CONTROLLED WITH YOUR NAUSEA MEDICATION  *UNUSUAL SHORTNESS OF BREATH  *UNUSUAL BRUISING OR BLEEDING  TENDERNESS IN MOUTH AND THROAT WITH OR WITHOUT PRESENCE OF ULCERS  *URINARY PROBLEMS  *BOWEL PROBLEMS  UNUSUAL RASH Items with * indicate a potential emergency and should be followed up as soon as possible.  Feel free to call the clinic you have any questions or concerns. The clinic phone number is [phone].  Please show the [facility] at check-in to the Emergency Department and triage nurse.

## 2016-01-01 NOTE — Progress Notes (Signed)
[facility] [phone]   Fax:([phone]) [place], MD [address] [place] [address]  DIAGNOSIS AND DIAGNOSIS: Metastatic non-small cell lung cancer adenocarcinoma diagnosed in September 2010 . PDL 1 expression 50%  PRIOR THERAPY:  #1 status post 6 cycles of systemic chemotherapy with carboplatin, Alimta and Avastin given every 3 weeks last dose given 04/23/2009 with disease stabilization.  #2 status post palliative radiotherapy to the mediastinum under the care of Dr. Pablo Ledger. The patient received a total dose of 3000 cGY and 12 fractions completed 01/21/2009.  #3 Maintenance chemotherapy with Alimta at 500 mg per meter squared and Avastin at 15 mg per kilogram given every 3 weeks status post 35 cycles.  #4 Maintenance chemotherapy with Alimta 500 mg/M2 and Avastin 15 mg/kg every 4 weeks,status post 17 cycles, discontinued secondary to disease progression.  #5 Systemic chemotherapy with carboplatin for AUC of 5, Alimta 500 mg/M2 and Avastin 15 mg/kg every 3 weeks, status post 3 cycles, last cycle was given 12/28/2012. Carboplatin was discontinued starting cycle #2 secondary to hypersensitivity reaction. #6 Maintenance chemotherapy again with Alimta 500 mg/M2 and Avastin 15 mg/kg every 3 weeks, first cycle 01/23/2013. Status post 6 cycles. #7 palliative radiotherapy to the large abdominal mass under the care of Dr. Pablo Ledger. #8  Immunotherapy with Nivolumab 3 MG/KG every 2 weeks. First dose 10/04/2014. Status post 3 cycles discontinued secondary to disease progression. #9. Status post palliative radiotherapy to the progressive retroperitoneal lymph nodes under the care of Dr. Sondra Come.  CURRENT THERAPY: Ketruda 200 mg IV every 3 weeks. First dose on 11/20/2015. Status post 2 cycles.  CODE STATUS: No CODE BLUE  INTERVAL HISTORY: [name] 70 y.o. male returns to the clinic today for follow-up visit  accompanied by his wife. The patient is feeling much better today. He tolerated the second cycle of immunotherapy with [place] (pembrolizumab) fairly well with no significant adverse effects except for mild skin rash on the back and abdomen in the same area where he received radiotherapy. He was seen at the symptom management clinic and was started on Benadryl and Pepcid with mild improvement. He denied having any significant nausea, vomiting, diarrhea or constipation. He has no chest pain, shortness breath, cough or hemoptysis. He gained few pounds since his last visit. He is here today for evaluation before starting cycle #3 of his immunotherapy.  MEDICAL HISTORY: Past Medical History:  Diagnosis Date  . Abnormality of gait 03/12/2015  . Acute meniscal tear of knee RIGHT KNEE  . ARF (acute renal failure) ([place]) 03/2015   pre-renal patient was septic  . Arthritis RIGHT SHOULDER  . BPH (benign prostatic hypertrophy)   . Dizziness and giddiness 04/23/2014  . 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 HOH (hard of hearing)    bilateral hearing aids  . Hypertension   . Immature cataract BILATERAL  . Non-small cell lung cancer ([place]) DX SEPT 2010  W/ CHEMORADIATION AT THAT TIME -- NOW  W/ METS--  CURRENTLY ON MAINTENANCE CHEMO TX  EVERY 26 DAYS   ONCOLOGIST- DR [facility]- lung cancer with metastasis.  . Orthostatic hypotension    B/P tends to run 110/70 or less -using med to raise B/P.  Marland Kitchen Pneumonia   . Radiation 01/08/09-01/21/09   Mediastinum 30 Gy x 12 fractions  . Radiation 08/19/14-09/04/14   Palliative RT Porta hepatic LN 30 Gy  .  Sepsis ([place]) 03/2015  . Shortness of breath dyspnea    with exertion  . Swallowing impairment    "Unable to swallow"  . Tremor 04/23/2014   Right hand    ALLERGIES:  is allergic to fentanyl; augmentin [amoxicillin-pot clavulanate]; levaquin [levofloxacin]; percocet [oxycodone-acetaminophen]; scopolamine;  zolpidem tartrate; and carboplatin.  MEDICATIONS:  Current Outpatient Prescriptions  Medication Sig Dispense Refill  . b complex vitamins tablet Take 1 tablet by mouth daily.    Marland Kitchen dicyclomine (BENTYL) 20 MG tablet Take 20 mg by mouth every 6 (six) hours as needed (for abdominal cramps).    . diphenhydramine-acetaminophen (TYLENOL PM) 25-500 MG TABS tablet Take 1 tablet by mouth at bedtime as needed (for sleep).     . FeFum-FePoly-FA-B Cmp-C-Biot (INTEGRA PLUS) CAPS Give 1 capsule by tube daily.    Marland Kitchen HYDROmorphone (DILAUDID) 2 MG tablet Take 2-4 mg by mouth every 4 (four) hours as needed for severe pain.    Marland Kitchen LORazepam (ATIVAN) 1 MG tablet Take 0.5-1 mg by mouth every 4 (four) hours as needed for anxiety (and/or nausea).    . midodrine (PROAMATINE) 5 MG tablet Take 5 mg by mouth 3 (three) times daily with meals.    . Multiple Vitamin (MULTIVITAMIN WITH MINERALS) TABS tablet Take 1 tablet by mouth daily.    . mupirocin ointment (BACTROBAN) 2 % Apply 1 application topically 3 (three) times daily. Pt applies around tube area.  0  . naproxen sodium (ANAPROX) 220 MG tablet Take 220-440 mg by mouth 2 (two) times daily as needed (for pain).     . Nutritional Supplements (FEEDING SUPPLEMENT, OSMOLITE 1.5 CAL,) LIQD Place 237 mLs into feeding tube 3 (three) times daily.     . Nutritional Supplements (TWOCAL HN) LIQD Place 237 mLs into feeding tube 3 (three) times daily.    Marland Kitchen omeprazole (PRILOSEC) 40 MG capsule Take 40 mg by mouth at bedtime.     . ondansetron (ZOFRAN) 8 MG tablet Take 1 tablet (8 mg total) by mouth every 8 (eight) hours as needed for nausea or vomiting. 30 tablet 0  . promethazine (PHENERGAN) 25 MG tablet Take 25 mg by mouth every 6 (six) hours as needed for nausea or vomiting.    . senna (SENOKOT) 8.6 MG tablet Take 1 tablet by mouth daily.     No current facility-administered medications for this visit.     SURGICAL HISTORY:  Past Surgical History:  Procedure Laterality Date  .  BILATERAL EAR DRUM SURGERY  1960'S  . BILE DUCT STENT PLACEMENT    . CATARACT EXTRACTION Bilateral   . ERCP N/A 08/09/2014   Procedure: ENDOSCOPIC RETROGRADE CHOLANGIOPANCREATOGRAPHY (ERCP);  Surgeon: Carol Ada, MD;  Location: [facility];  Service: Endoscopy;  Laterality: N/A;  . ERCP N/A 05/23/2015   Procedure: ENDOSCOPIC RETROGRADE CHOLANGIOPANCREATOGRAPHY (ERCP);  Surgeon: Carol Ada, MD;  Location[facility] END[facility]COPY;  Service: Endoscopy;  Laterality: N/A;  . ERCP N/A 05/30/2015   Procedure: ENDOSCOPIC RETROGRADE CHOLANGIOPANCREATOGRAPHY (ERCP);  Surgeon: Carol Ada, MD;  Location: [facility] ENDOSCOPY;  Service: Endoscopy;  Laterality: N/A;  . ESOPHAGOGASTRODUODENOSCOPY (EGD) WITH PROPOFOL N/A 08/09/2014   Procedure: ESOPHAGOGASTRODUODENOSCOPY (EGD) WITH PROPOFOL;  Surgeon: Carol Ada, MD;  Location: [facility] ENDOSCOPY;  Service: Endoscopy;  Laterality: N/A;  . EUS N/A 08/09/2014   Procedure: UPPER ENDOSCOPIC ULTRASOUND (EUS) RADIAL;  Surgeon: Carol Ada, MD;  Location: [facility] ENDOSCOPY;  Service: Endoscopy;  Laterality: N/A;  . FINE NEEDLE ASPIRATION N/A 08/09/2014   Procedure: FINE NEEDLE ASPIRATION (FNA) LINEAR;  Surgeon: Carol Ada,  MD;  Location: [facility] ENDOSCOPY;  Service: Endoscopy;  Laterality: N/A;  . GASTROSTOMY TUBE PLACEMENT  10/2014  . KNEE ARTHROSCOPY  12/21/2011   Procedure: ARTHROSCOPY KNEE;  Surgeon: Tobi Bastos, MD;  Location[facility];  Service: Orthopedics;  Laterality: Right;  WITH MEDIAL MENISECTOMY  . LARYNGOSCOPY  01/2015 and 04/21/15  . ORIF LEFT RING FINGER  AND REVASCULARIZATION OF RADIAL SIDE  02-16-2007   DEGLOVING INJURY  . [place] cath    . PORTACATH PLACEMENT  Removed 06/20/3015   as of 05-27-15 remains right chest  . ROTATOR CUFF REPAIR  2008   LEFT SHOULDER  . SHOULDER OPEN ROTATOR CUFF REPAIR Right 06/05/2014   Procedure: RIGHT ROTATOR CUFF REPAIR SHOULDER OPEN;  Surgeon: Latanya Maudlin, MD;  Location: [facility] ORS;  Service: Orthopedics;  Laterality: Right;  .  TONSILLECTOMY     as child    REVIEW OF SYSTEMS:  A comprehensive review of systems was negative except for: Constitutional: positive for fatigue Integument/breast: positive for rash   PHYSICAL EXAMINATION: General appearance: alert, cooperative, cachectic, fatigued and no distress Head: Normocephalic, without obvious abnormality, atraumatic Neck: no adenopathy, no JVD, supple, symmetrical, trachea midline and thyroid not enlarged, symmetric, no tenderness/mass/nodules Lymph nodes: Cervical, supraclavicular, and axillary nodes normal. Resp: clear to auscultation bilaterally Back: symmetric, no curvature. ROM normal. No CVA tenderness. Cardio: regular rate and rhythm, S1, S2 normal, no murmur, click, rub or gallop GI: soft, non-tender; bowel sounds normal; no masses,  no organomegaly Extremities: extremities normal, atraumatic, no cyanosis or edema Neurologic: Alert and oriented X 3, normal strength and tone. Normal symmetric reflexes. Normal coordination and gait  ECOG PERFORMANCE STATUS: 1 - Symptomatic but completely ambulatory  Blood pressure 128/80, pulse 79, temperature 97.7 F (36.5 C), temperature source Oral, resp. rate 18, height 5' 8"  (1.727 m), weight 139 lb 1.6 oz (63.1 kg), SpO2 98 %.  LABORATORY DATA: Lab Results  Component Value Date   WBC 7.2 01/01/2016   HGB 13.0 01/01/2016   HCT 37.5 (L) 01/01/2016   MCV 104.2 (H) 01/01/2016   PLT 102 (L) 01/01/2016      Chemistry      Component Value Date/Time   NA 139 12/11/2015 1002   K 4.5 12/11/2015 1002   CL 104 10/26/2015 1715   CL 109 (H) 08/29/2012 0856   CO2 22 12/11/2015 1002   BUN 32.8 (H) 12/11/2015 1002   CREATININE 1.3 12/11/2015 1002      Component Value Date/Time   CALCIUM 9.4 12/11/2015 1002   ALKPHOS 328 (H) 12/11/2015 1002   AST 52 (H) 12/11/2015 1002   ALT 90 (H) 12/11/2015 1002   BILITOT 2.48 (H) 12/11/2015 1002       RADIOGRAPHIC STUDIES: Dg Op Swallowing Func-medicare/speech  Path  Result Date: 12/19/2015 Objective Swallowing Evaluation: Type of Study: MBS-Modified Barium Swallow Study Patient Details Name: Jejuan Scala MRN: [id number] Date of Birth: 1945-11-24 Today's Date: 12/19/2015 Time: SLP Start Time (ACUTE ONLY): 1320-SLP Stop Time (ACUTE ONLY): 1355 SLP Time Calculation (min) (ACUTE ONLY): 35 min Past Medical History: Past Medical History: Diagnosis Date . Abnormality of gait 03/12/2015 . Acute meniscal tear of knee RIGHT KNEE . ARF (acute renal failure) ([place]) 03/2015  pre-renal patient was septic . Arthritis RIGHT SHOULDER . BPH (benign prostatic hypertrophy)  . Dizziness and giddiness 04/23/2014 . Gastrostomy tube dependent (HCC)   Continous feedings per Gastostomy tube"unable to swallow" . HOH (hard of hearing)   bilateral hearing aids . Hypertension  . Immature  cataract BILATERAL . Non-small cell lung cancer ([place]) DX SEPT 2010  W/ CHEMORADIATION AT THAT TIME -- NOW  W/ METS--  CURRENTLY ON MAINTENANCE CHEMO TX  EVERY 68 DAYS  ONCOLOGIST- DR [facility]- lung cancer with metastasis. . Orthostatic hypotension   B/P tends to run 110/70 or less -using med to raise B/P. Marland Kitchen Pneumonia  . Radiation 01/08/09-01/21/09  Mediastinum 30 Gy x 12 fractions . Radiation 08/19/14-09/04/14  Palliative RT Porta hepatic LN 30 Gy . Sepsis ([place]) 03/2015 . Shortness of breath dyspnea   with exertion . Swallowing impairment   "Unable to swallow" . Tremor 04/23/2014  Right hand Past Surgical History: Past Surgical History: Procedure Laterality Date . BILATERAL EAR DRUM SURGERY  1960'S . BILE DUCT STENT PLACEMENT   . CATARACT EXTRACTION Bilateral  . ERCP N/A 08/09/2014  Procedure: ENDOSCOPIC RETROGRADE CHOLANGIOPANCREATOGRAPHY (ERCP);  Surgeon: Carol Ada, MD;  Location: [facility];  Service: Endoscopy;  Laterality: N/A; . ERCP N/A 05/23/2015  Procedure: ENDOSCOPIC RETROGRADE CHOLANGIOPANCREATOGRAPHY (ERCP);  Surgeon: Carol Ada, MD;  Location[facility] ENDOSCOPY;  Service: Endoscopy;  Laterality: N/A; . ERCP N/A  05/30/2015  Procedure: ENDOSCOPIC RETROGRADE CHOLANGIOPANCREATOGRAPHY (ERCP);  Surgeon: Carol Ada, MD;  Location: [facility];  Service: Endoscopy;  Laterality: N/A; . ESOPHAGOGASTRODUODENOSCOPY (EGD) WITH PROPOFOL N/A 08/09/2014  Procedure: ESOPHAGOGASTRODUODENOSCOPY (EGD) WITH PROPOFOL;  Surgeon: Carol Ada, MD;  Location: [facility] ENDOSCOPY;  Service: Endoscopy;  Laterality: N/A; . EUS N/A 08/09/2014  Procedure: UPPER ENDOSCOPIC ULTRASOUND (EUS) RADIAL;  Surgeon: Carol Ada, MD;  Location: [facility] ENDOSCOPY;  Service: Endoscopy;  Laterality: N/A; . FINE NEEDLE ASPIRATION N/A 08/09/2014  Procedure: FINE NEEDLE ASPIRATION (FNA) LINEAR;  Surgeon: Carol Ada, MD;  Location: [facility] ENDOSCOPY;  Service: Endoscopy;  Laterality: N/A; . GASTROSTOMY TUBE PLACEMENT  10/2014 . KNEE ARTHROSCOPY  12/21/2011  Procedure: ARTHROSCOPY KNEE;  Surgeon: Tobi Bastos, MD;  Location: [facility];  Service: Orthopedics;  Laterality: Right;  WITH MEDIAL MENISECTOMY . LARYNGOSCOPY  01/2015 and 04/21/15 . ORIF LEFT RING FINGER  AND REVASCULARIZATION OF RADIAL SIDE  02-16-2007  DEGLOVING INJURY . [place] cath   . PORTACATH PLACEMENT  Removed 06/20/3015  as of 05-27-15 remains right chest . ROTATOR CUFF REPAIR  2008  LEFT SHOULDER . SHOULDER OPEN ROTATOR CUFF REPAIR Right 06/05/2014  Procedure: RIGHT ROTATOR CUFF REPAIR SHOULDER OPEN;  Surgeon: Latanya Maudlin, MD;  Location: [facility];  Service: Orthopedics;  Laterality: Right; . TONSILLECTOMY    as child HPI: 69 yo male with complex medical hx including metastatic lung cancer s/p radiation tx over 7 years - most recent near throat finishing radiation 12/2014.  PMH + for right hand tremor, hearing loss, pna, DOE, ARF, dysphagia requiring PEG August 2016, EGD 07/2014, left vocal fold paralysis s/p injection.  Pt uses PEG for nutrition.  His MBS 09/16/2015 showed severe pharyngeal phase dysphagia with gross residuals across all consistencies, laryngeal penetration of residuals, no aspiration- head  turn and chin tuck not helpful.  Expectoration cleared residuals from vallecular space.  Pyriform sinus residuals did not clear.  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   Pt has improved hyo-laryngeal motility compared to prior exam.  Mildly narrowed space between epiglottis and posterior  pharyngeal wall noted due to curvature of spine contributing to epiglottis contacting pharyngeal wall with swallow.  This anatomical/mechanical factor combined with decreased tongue base retraction result in gross vallecular >pyriform sinus residuals most notably with increased viscocity.  Chin tuck posture reattempted without improvement.  Pt does sense large amounts of vallecular residuals and clears with expectoration.  Multiple swallows x 4-5 with each bolus and liquid swallows help to decrease residuals.  Mild laryngeal penetration of sequential boluses of thin liquid barium observed (on 6th swallow) clearing with cued throat clear.  No aspiration observed during MBS. Recommend pt initiate po of softer foods/thin liquids with strict precautions including starting meals with water, conducing multiple swallows, following solids with liquids and frequent expectoration*hocking.  Suspect pt will need to continue PEG tube feeding for nutritional support.   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  Pt expressed disappointment with lack of improvement in swallowing; SLP offered apologies for his frustration and expressed for chronic dysphagia with XRT side  effects.  Offered encouragement for pt to continue exercises to decrease/mitigate further fibrosis.  Impact on safety and function Moderate aspiration risk;Risk for inadequate nutrition/hydration   CHL IP TREATMENT RECOMMENDATION 12/19/2015 Treatment Recommendations Defer treatment plan to f/u with SLP   No flowsheet data found. CHL IP DIET RECOMMENDATION 12/19/2015 SLP Diet Recommendations Dysphagia 3 (Mech soft) solids;Thin liquid Liquid Administration via Cup;Straw Medication Administration Via alternative means Compensations Slow rate;Small sips/bites;Multiple dry swallows after each bite/sip;Follow solids with liquid;Other (Comment)Expectorate after few bites/sips - Postural Changes Remain semi-upright after after feeds/meals (Comment);Seated upright at 90 degrees   CHL IP OTHER RECOMMENDATIONS 12/19/2015 Recommended Consults -- Oral Care Recommendations Oral care BID Other Recommendations --   CHL IP FOLLOW UP RECOMMENDATIONS 12/19/2015 Follow up Recommendations Outpatient SLP   No flowsheet data found.     CHL IP ORAL PHASE 12/19/2015 Oral Phase WFL Oral - Pudding Teaspoon -- Oral - Pudding Cup -- Oral - Honey Teaspoon -- Oral - Honey Cup -- Oral - Nectar Teaspoon -- Oral - Nectar Cup WFL Oral - Nectar Straw -- Oral - Thin Teaspoon -- Oral - Thin Cup WFL Oral - Thin Straw WFL Oral - Puree WFL Oral - Mech Soft -- Oral - Regular WFL Oral - Multi-Consistency -- Oral - Pill -- Oral Phase - Comment --  CHL IP PHARYNGEAL PHASE 12/19/2015 Pharyngeal Phase Impaired Pharyngeal- Pudding Teaspoon -- Pharyngeal -- Pharyngeal- Pudding Cup -- Pharyngeal -- Pharyngeal- Honey Teaspoon -- Pharyngeal -- Pharyngeal- Honey Cup -- Pharyngeal -- Pharyngeal- Nectar Teaspoon -- Pharyngeal -- Pharyngeal- Nectar Cup Reduced epiglottic inversion;Reduced laryngeal elevation;Reduced airway/laryngeal closure;Reduced tongue base retraction;Pharyngeal residue - valleculae;Pharyngeal residue - pyriform Pharyngeal -- Pharyngeal- Nectar Straw --  Pharyngeal -- Pharyngeal- Thin Teaspoon -- Pharyngeal -- Pharyngeal- Thin Cup Reduced epiglottic inversion;Reduced anterior laryngeal mobility;Reduced laryngeal elevation;Reduced airway/laryngeal closure;Reduced tongue base retraction;Pharyngeal residue - valleculae;Pharyngeal residue - pyriform Pharyngeal -- Pharyngeal- Thin Straw Reduced epiglottic inversion;Reduced airway/laryngeal closure;Reduced tongue base retraction;Penetration/Aspiration during swallow;Pharyngeal residue - valleculae;Pharyngeal residue - pyriform Pharyngeal Material enters airway, CONTACTS cords and not ejected out Pharyngeal- Puree Reduced epiglottic inversion;Reduced laryngeal elevation;Reduced airway/laryngeal closure;Reduced tongue base retraction;Pharyngeal residue - valleculae Pharyngeal -- Pharyngeal- Mechanical Soft -- Pharyngeal -- Pharyngeal- Regular Reduced epiglottic inversion;Reduced anterior laryngeal mobility;Reduced laryngeal elevation;Reduced airway/laryngeal closure Pharyngeal -- Pharyngeal- Multi-consistency -- Pharyngeal -- Pharyngeal- Pill -- Pharyngeal -- Pharyngeal Comment --  CHL IP CERVICAL ESOPHAGEAL PHASE 12/19/2015 Cervical Esophageal Phase Impaired Pudding Teaspoon -- Pudding Cup -- Honey Teaspoon --  Honey Cup -- Nectar Teaspoon -- Nectar Cup -- Nectar Straw -- Thin Teaspoon -- Thin Cup -- Thin Straw -- Puree -- Mechanical Soft -- Regular -- Multi-consistency -- Pill -- Cervical Esophageal Comment multiple swallows faciliate clearance CHL IP GO 12/19/2015 Functional Assessment Tool Used mbs, clinical judgement Functional Limitations Swallowing Swallow Current Status (S1779) CL Swallow Goal Status (T9030) CL Swallow Discharge Status [phone]) CL Janett Labella [place], MS [facility] SLP [phone]  CLINICAL DATA:  Dysphagia. Lung cancer with radiation therapy in the neck. EXAM: MODIFIED BARIUM SWALLOW TECHNIQUE: Different consistencies of barium were administered orally to the patient by the Speech Pathologist.  Imaging of the pharynx was performed in the lateral projection. FLUOROSCOPY TIME:  Fluoroscopy Time:  2.2 minutes Radiation Exposure Index (if provided by the fluoroscopic device): 8.5 mGy Number of Acquired Spot Images: 0 COMPARISON:  09/16/2015 FINDINGS: Thin liquid- poor epiglottic inversion. Vallecular retention which the patient can sense, an which clears with a hawking maneuver. With repeated thin liquid swallows using a straw, the patient demonstrated laryngeal penetration to the cords. Nectar thick liquid- vallecular retention Pure with cracker- vallecular retention IMPRESSION: 1. Considerable vallecular retention of contrast partially attributable to poor epiglottic inversion. With rapid serial repeated thin liquid swallows using a straw, laryngeal penetration was observed to the cords, but with single swallows we did not observe laryngeal penetration. Please refer to the Speech Pathologists report for complete details and recommendations. Electronically Signed   By: Van Clines M.D.   On: 12/19/2015 14:19    ASSESSMENT AND PLAN: This is a very pleasant 70 years old white male with a stage IV non-small cell lung cancer diagnosed in September 2010 status post several chemotherapy regimens including immunotherapy which was discontinued recently secondary to disease progression. Over the last few months he patient has significant improvement in his condition. He requested reevaluation for treatment. The recent CT scan of the chest, abdomen and pelvis showed response to therapy of the right paratracheal adenopathy but there was progression of the retroperitoneal metastatic disease. The final pathology from the CT-guided core biopsy of the enlarging retroperitoneal lymphadenopathy was consistent with metastatic poorly differentiated carcinoma. Molecular studies were negative for EGFR, ALK and ROS 1. PDL 1 expression is 50%.   He is currently on immunotherapy with Ketruda (pembrolizumab) status  post 2 cycles. He is tolerating the treatment well. I recommended for the patient to proceed with cycle #3 today as a scheduled. I will see him back for follow-up visit in 3 weeks with the start of cycle #4 after repeating CT scan of the chest, abdomen and pelvis for restaging of his disease. For the rash, we'll continue to monitor this closely for now. He will also continue with Pepcid, Benadryl or Claritin as needed. He was also advised to use topical hydrocortisone cream as needed. He was advised to call immediately if he has any concerning symptoms. The patient voices understanding of current disease status and treatment options and is in agreement with the current care plan.  All questions were answered. The patient knows to call the clinic with any problems, questions or concerns. We can certainly see the patient much sooner if necessary.  Disclaimer: This note was dictated with voice recognition software. Similar sounding words can inadvertently be transcribed and may not be corrected upon review.

## 2016-01-01 NOTE — Progress Notes (Signed)
Nutrition follow-up completed with patient during infusion for metastatic lung cancer. Weight improved and was documented as 139.1 pounds increased from 137.6 pounds. Patient continues to utilize Osmolite 1.5 and TwoCal HN daily.  He typically uses between 5 and 6 cans Patient denies complications from tube feeding. Patient is discouraged that he cannot increase variety of foods he can swallow.  Estimated nutrition needs: 2200-2500 calories, 95-105 g protein, 2.5 L fluid.  Nutrition diagnosis: Malnutrition improved.  Intervention: Patient educated to continue tube feedings to provide greater than 90% of estimated nutrition needs. Reviewed foods patient could try and encouraged them to keep a food journal to document foods successes. Provided support and encouragement. Questions were answered.  Teach back method used.  Monitoring, evaluation, goals: Patient will work to continue to improve oral intake while still using tube feeding to provide majority of nutrition.  Next visit: Thursday, November 16 infusion.  Patient has my contact information if he develops questions before then.  **Disclaimer: This note was dictated with voice recognition software. Similar sounding words can inadvertently be transcribed and this note may contain transcription errors which may not have been corrected upon publication of note.**

## 2016-01-01 NOTE — Telephone Encounter (Signed)
Gave patient avs report and appointments for October thru December. Central radiology will call re scan.  °

## 2016-01-02 ENCOUNTER — Ambulatory Visit: Payer: Medicare Other | Attending: Family Medicine

## 2016-01-02 DIAGNOSIS — M6281 Muscle weakness (generalized): Secondary | ICD-10-CM | POA: Diagnosis present

## 2016-01-02 DIAGNOSIS — R2689 Other abnormalities of gait and mobility: Secondary | ICD-10-CM | POA: Diagnosis present

## 2016-01-02 DIAGNOSIS — R42 Dizziness and giddiness: Secondary | ICD-10-CM | POA: Insufficient documentation

## 2016-01-02 NOTE — Therapy (Signed)
[facility] [address] [place], [place], [address] Phone: [phone]   Fax:  [phone]  Physical Therapy Treatment  Patient Details  Name: [name] MRN: [id number] Date of Birth: 01/22/1946 Referring Provider: Dr. Christella Noa  Encounter Date: 01/02/2016      PT End of Session - 01/02/16 1249    Visit Number 6   Number of Visits 9   Date for PT Re-Evaluation 01/17/16   Authorization Type Medicare - GCodes and progress notes required   PT Start Time 1148   PT Stop Time 1232   PT Time Calculation (min) 44 min   Equipment Utilized During Treatment --  safety stop from treadmill on pt's belt.   Activity Tolerance Patient tolerated treatment well;No increased pain   Behavior During Therapy WFL for tasks assessed/performed      Past Medical History:  Diagnosis Date  . Abnormality of gait 03/12/2015  . Acute meniscal tear of knee RIGHT KNEE  . ARF (acute renal failure) ([place]) 03/2015   pre-renal patient was septic  . Arthritis RIGHT SHOULDER  . BPH (benign prostatic hypertrophy)   . Dizziness and giddiness 04/23/2014  . 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 HOH (hard of hearing)    bilateral hearing aids  . Hypertension   . Immature cataract BILATERAL  . Non-small cell lung cancer ([place]) DX SEPT 2010  W/ CHEMORADIATION AT THAT TIME -- NOW  W/ METS--  CURRENTLY ON MAINTENANCE CHEMO TX  EVERY 49 DAYS   ONCOLOGIST- DR [facility]- lung cancer with metastasis.  . Orthostatic hypotension    B/P tends to run 110/70 or less -using med to raise B/P.  Marland Kitchen Pneumonia   . Radiation 01/08/09-01/21/09   Mediastinum 30 Gy x 12 fractions  . Radiation 08/19/14-09/04/14   Palliative RT Porta hepatic LN 30 Gy  . Sepsis ([place]) 03/2015  . Shortness of breath dyspnea    with exertion  . Swallowing impairment    "Unable to swallow"  . Tremor 04/23/2014   Right hand     Past Surgical History:  Procedure Laterality Date  . BILATERAL EAR DRUM SURGERY  1960'S  . BILE DUCT STENT PLACEMENT    . CATARACT EXTRACTION Bilateral   . ERCP N/A 08/09/2014   Procedure: ENDOSCOPIC RETROGRADE CHOLANGIOPANCREATOGRAPHY (ERCP);  Surgeon: Carol Ada, MD;  Location: [facility];  Service: Endoscopy;  Laterality: N/A;  . ERCP N/A 05/23/2015   Procedure: ENDOSCOPIC RETROGRADE CHOLANGIOPANCREATOGRAPHY (ERCP);  Surgeon: Carol Ada, MD;  Location: [facility] ENDOSCOPY;  Service: Endoscopy;  Laterality: N/A;  . ERCP N/A 05/30/2015   Procedure: ENDOSCOPIC RETROGRADE CHOLANGIOPANCREATOGRAPHY (ERCP);  Surgeon: Carol Ada, MD;  Location: [facility]OS[facility];  Service: Endoscopy;  Laterality: N/A;  . ESOPHAGOGASTRODUODENOSCOPY (EGD) WITH PROPOFOL N/A 08/09/2014   Procedure: ESOPHAGOGASTRODUODENOSCOPY (EGD) WITH PROPOFOL;  Surgeon: Carol Ada, MD;  Location: [facility] ENDOSCOPY;  Service: Endoscopy;  Laterality: N/A;  . EUS N/A 08/09/2014   Procedure: UPPER ENDOSCOPIC ULTRASOUND (EUS) RADIAL;  Surgeon: Carol Ada, MD;  Location: [facility] ENDOSCOPY;  Service: Endoscopy;  Laterality: N/A;  . FINE NEEDLE ASPIRATION N/A 08/09/2014   Procedure: FINE NEEDLE ASPIRATION (FNA) LINEAR;  Surgeon: Carol Ada, MD;  Location: [facility] ENDOSCOPY;  Service: Endoscopy;  Laterality: N/A;  . GASTROSTOMY TUBE PLACEMENT  10/2014  . KNEE ARTHROSCOPY  12/21/2011   Procedure: ARTHROSCOPY KNEE;  Surgeon: Tobi Bastos, MD;  Location: [facility];  Service: Orthopedics;  Laterality: Right;  WITH  MEDIAL MENISECTOMY  . LARYNGOSCOPY  01/2015 and 04/21/15  . ORIF LEFT RING FINGER  AND REVASCULARIZATION OF RADIAL SIDE  02-16-2007   DEGLOVING INJURY  . [place] cath    . PORTACATH PLACEMENT  Removed 06/20/3015   as of 05-27-15 remains right chest  . ROTATOR CUFF REPAIR  2008   LEFT SHOULDER  . SHOULDER OPEN ROTATOR CUFF REPAIR Right 06/05/2014   Procedure: RIGHT ROTATOR CUFF REPAIR SHOULDER OPEN;  Surgeon: Latanya Maudlin, MD;   Location: [facility];  Service: Orthopedics;  Laterality: Right;  . TONSILLECTOMY     as child    There were no vitals filed for this visit.      Subjective Assessment - 01/02/16 1152    Subjective Pt denied falls since last visit.   Patient is accompained by: Family member  [name]-wife   Pertinent History metastatic lung cancer, orthostatis, dysphagia, hx of dizziness, R knee OA, hx of [name] knee menisus tear (per pt), CRF   Patient Stated Goals Get my overall strength built up   Currently in Pain? Yes   Pain Score 3    Pain Location Neck   Pain Orientation Right   Pain Descriptors / Indicators Sharp   Pain Type Acute pain   Pain Onset 1 to 4 weeks ago   Pain Frequency Intermittent   Aggravating Factors  moving/turning neck side to side   Pain Relieving Factors rest and head and manual therapy                         [facility] Adult PT Treatment/Exercise - 01/02/16 1159      Ambulation/Gait   Ambulation/Gait Yes   Ambulation/Gait Assistance 6: Modified independent (Device/Increase time)   Ambulation/Gait Assistance Details No LOB    Ambulation Distance (Feet) 75 Feet  x3   Assistive device Straight cane   Gait Pattern Step-through pattern;Decreased stride length;Decreased trunk rotation;Decreased dorsiflexion - left;Decreased dorsiflexion - right   Ambulation Surface Level;Indoor   Stairs Yes   Stairs Assistance 5: Supervision   Stairs Assistance Details (indicate cue type and reason) Cues to improve ant. weight shifting to ascend steps.   Stair Management Technique One rail Right;Alternating pattern;One rail Left   Number of Stairs 4   Height of Stairs 6     Exercises   Exercises Knee/Hip     Knee/Hip Exercises: Aerobic   Tread Mill With B UE support and PT providing S for safety, and safety stop on pt's belt: pt performed treadmill amb. at 1.0-1.44mh for 8 mintues. Cues for upright posture and incr. heel strike.     Manual Therapy   Manual Therapy Soft  tissue mobilization;Myofascial release   Soft tissue mobilization Supine, hook lying: soft tissue mobilization at R SCM x12 minutes to address sof tissue restrictions/myofascial adhesions.   Myofascial Release Supine, hook lying, suboccipital release x2 minutes to improve postural alignment.   Other Manual Therapy Contract-relax PNF (3 x5-sec holds each) alternating with prolonged passive stretch (3 x30-sec holds each) of B cervical spine rotation and B lcervical spine  lateral flexion. PT instructed pt's wife how to perform SCM stretch in hooklying as pt reported incr. pain during HEP stretch in seated position at home.                 PT Education - 01/02/16 1247    Education provided Yes   Education Details PT educated pt on modifying SCM stretch from seated to hooklying for improved comfort.  PT educated pt on treadmill safety, and stated that pt is safe to try treadmill at home, when pt unable to amb. outdoors due to weather.   Person(s) Educated Patient;Spouse   Methods Explanation   Comprehension Verbalized understanding          PT Short Term Goals - 12/19/15 1245      PT SHORT TERM GOAL #1   Title Pt will be IND in HEP to improve strength, endurance, and balance. TARGET DATE FOR ALL STGS: 12/16/15   Status Partially Met     PT SHORT TERM GOAL #2   Title Perform DGI and write goals prn.    Baseline DGI =13   Status Achieved     PT SHORT TERM GOAL #3   Title Pt will amb. 200' with SPC and S to improve functional mobility, over even terrain.    Status Achieved     PT SHORT TERM GOAL #4   Title Pt will perform TUG with LRAD in </=13.5sec. to decr. falls risk.    Status Achieved           PT Long Term Goals - 12/26/15 1419      PT LONG TERM GOAL #1   Title Pt will improve 6MWT distance from 798' to 998' to improve endurance. TARGET DATE FOR ALL LTGS: 01/13/16   Status On-going     PT LONG TERM GOAL #2   Title Assess ability to amb. on treadmill and write  goal if applicable and safe.   Status On-going     PT LONG TERM GOAL #3   Title Pt will amb. 100' without AD, with S, in order to amb. safely at home.    Status On-going     PT LONG TERM GOAL #4   Title Pt will amb. 400' over even/paved surfaces with LRAD at MOD I level in order to improve functional mobility.    Status On-going     PT LONG TERM GOAL #5   Title Assess stair climbing ability and write goals prn.    Status On-going     Additional Long Term Goals   Additional Long Term Goals Yes     PT LONG TERM GOAL #6   Title Pt will incr DGI to > 19/24 to allow pt to ambulate safely at home and in the community.     Baseline 13/24   Status On-going     PT LONG TERM GOAL #7   Title Pt will engage in all above activities with no more than 2-point increase in cervical spine pain to indicate decreased impact of pain on activity tolerance.     Status New               Plan - 01/02/16 1250    Clinical Impression Statement Pt demonstrated progress, as he reported R SCM pain decr. from 3/10 to 0-1/10 after manual therapy techniques. Pt also tolerated amb. on treadmill without LOB or incr. postural sway for 8 minutes. Pt continues to exhibit FHP and rounded shoulders, which likely contribute to R SCM pain. Continue with POC.    Rehab Potential Good   Clinical Impairments Affecting Rehab Potential Pt has metastatic lung cancer and is currently receiving treatment   PT Treatment/Interventions ADLs/Self Care Home Management;Canalith Repostioning;DME Instruction;Gait training;Stair training;Functional mobility training;Therapeutic activities;Therapeutic exercise;Balance training;Vestibular;Neuromuscular re-education;Patient/family education;Passive range of motion;Manual techniques   PT Next Visit Plan Re-assess neck pain and treat prn. Continue B LE strengthening exercises, dynamic balance activities, gait with SPC  and no AD   PT Home Exercise Plan sit to stands, SLS at counter, wall  squat and walking program; 9/29: initiated HEP to address c-spine pain   Consulted and Agree with Plan of Care Patient;Family member/caregiver   Family Member Consulted wife, [name]      Patient will benefit from skilled therapeutic intervention in order to improve the following deficits and impairments:  Dizziness, Abnormal gait, Decreased mobility, Decreased endurance, Decreased balance, Postural dysfunction, Decreased strength, Pain  Visit Diagnosis: Other abnormalities of gait and mobility  Muscle weakness (generalized)  Dizziness and giddiness     Problem List Patient Active Problem List   Diagnosis Date Noted  . Rash 12/19/2015  . Neck pain 12/19/2015  . Chronic fatigue 11/10/2015  . Bacteremia due to Klebsiella pneumoniae 04/29/2015  . Infection due to Enterobacter cloacae 04/29/2015  . Hypokalemia 04/29/2015  . Gram-negative bacteremia 04/25/2015  . Septic shock ([place]) 04/23/2015  . Malignant neoplasm of lung ([place])   . Sepsis ([place])   . Acute kidney injury ([place])   . Abnormality of gait 03/12/2015  . Lung cancer ([place])   . Protein-calorie malnutrition, severe ([place]) 11/26/2014  . Hypoalbuminemia 11/18/2014  . Leukocytosis 11/18/2014  . Dysphagia 11/18/2014  . Central line complication 23/36/1224  . Anorexia 10/03/2014  . Weight loss 10/03/2014  . Transaminitis 10/03/2014  . Hyperphosphatemia 10/03/2014  . Encounter for antineoplastic immunotherapy 09/28/2014  . Non-small cell lung cancer ([place]) 09/21/2014  . Abdominal pain   . Fever   . 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 Biliary obstruction   . Fatigue 08/27/2014  . Abdominal pain, acute   . Abdominal mass   . Abdominal pain, generalized 08/07/2014  . CRF (chronic renal failure) 08/07/2014  . Obstructive jaundice 08/07/2014  . Malnutrition of moderate degree ([place]) 08/07/2014  . Obstructive jaundice due to cancer ([place]) 08/06/2014  . Rotator cuff tear, non-traumatic 06/05/2014   . Dizziness and giddiness 04/23/2014  . Tremor 04/23/2014  . Acute on chronic renal failure ([place]) 05/24/2013  . Pancytopenia ([place]) 05/24/2013  . Osteoarthritis of right knee 12/21/2011  . Meniscus, medial, posterior horn derangement 12/21/2011  . Dyspnea 12/04/2010  . Bronchogenic cancer of left lung ([place]) 12/11/2008  . ARTHRITIS 12/10/2008    [name] 01/02/2016, 12:53 PM  [facility] [address] [place], [place], [address] Phone: [phone]   Fax:  [phone]  Name: [name] MRN: [id number] Date of Birth: [birth year]  Geoffry Paradise, PT,DPT 01/02/16 12:54 PM Phone: [phone] Fax: [phone]

## 2016-01-09 ENCOUNTER — Ambulatory Visit: Payer: Medicare Other

## 2016-01-09 DIAGNOSIS — M6281 Muscle weakness (generalized): Secondary | ICD-10-CM

## 2016-01-09 DIAGNOSIS — R2689 Other abnormalities of gait and mobility: Secondary | ICD-10-CM | POA: Diagnosis not present

## 2016-01-09 NOTE — Patient Instructions (Signed)
External Rotation    Don't hold band. Keep elbows bent to 90. Keeping elbows at sides and thumbs up, move forearm out to side. Make sure you keep elbows at 90 degrees, and don't straighten arms. Repeat _10__ times each arm, alternating. Perform 3 sets. Do _3__ sessions per week.  Copyright  VHI. All rights reserved.   Bilateral Arm Curl    Sit on chair with 4lb. Wrist weight on each wrist. Bend elbows. Do _3__ sets of _10__ repetitions. Perform 3 days per week. Copyright  VHI. All rights reserved.   Scapular retraction: Sit with elbow at 90 degrees and then bring elbows back while squeezing shoulder blades together. Perform 3 sets of 10 reps. Perform 3 days a week.  ABDUCTION: Sitting - Exercise Ball (Active)    Sit on chair, arms at sides. Raise right arm out to side and up to shoulder height (90 degrees), keeping elbow straight.  Complete _3__ sets of __10_ repetitions. Perform _1__ sessions per day. Perform 3 days a week.  Copyright  VHI. All rights reserved.   FLEXION: Sitting - Exercise Ball (Active)    Sit with arms at side. Lift arm forward and up to 90 degrees (shoulder height), keeping elbow straight.  Complete _3__ sets of __10_ repetitions. Perform __1_ sessions per day. Perform 3 days per week.  Copyright  VHI. All rights reserved.   Tricep: Extension Drop (Eccentric)    Straigten arm back, standing with one hand on bed/table/counter. Then bend elbow again . _10__ reps per set, _2__ sets per day, _3__ days per week. Use 2lb. Wrist weight.  [email or web address].exer.us/48   Copyright  VHI. All rights reserved.

## 2016-01-09 NOTE — Therapy (Signed)
[facility] [address] [place], [place], [address] Phone: [phone]   Fax:  [phone]  Physical Therapy Treatment  Patient Details  Name: [name] MRN: [id number] Date of Birth: 06-Nov-1945 Referring Provider: Dr. Christella Noa  Encounter Date: 01/09/2016      PT End of Session - 01/09/16 1241    Visit Number 7   Number of Visits 9   Date for PT Re-Evaluation 01/17/16   Authorization Type Medicare - GCodes and progress notes required   PT Start Time 1148   PT Stop Time 1232   PT Time Calculation (min) 44 min   Activity Tolerance Patient tolerated treatment well   Behavior During Therapy [facility] for tasks assessed/performed      Past Medical History:  Diagnosis Date  . Abnormality of gait 03/12/2015  . Acute meniscal tear of knee RIGHT KNEE  . ARF (acute renal failure) ([place]) 03/2015   pre-renal patient was septic  . Arthritis RIGHT SHOULDER  . BPH (benign prostatic hypertrophy)   . Dizziness and giddiness 04/23/2014  . 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 HOH (hard of hearing)    bilateral hearing aids  . Hypertension   . Immature cataract BILATERAL  . Non-small cell lung cancer ([place]) DX SEPT 2010  W/ CHEMORADIATION AT THAT TIME -- NOW  W/ METS--  CURRENTLY ON MAINTENANCE CHEMO TX  EVERY 80 DAYS   ONCOLOGIST- DR [facility]- lung cancer with metastasis.  . Orthostatic hypotension    B/P tends to run 110/70 or less -using med to raise B/P.  Marland Kitchen Pneumonia   . Radiation 01/08/09-01/21/09   Mediastinum 30 Gy x 12 fractions  . Radiation 08/19/14-09/04/14   Palliative RT Porta hepatic LN 30 Gy  . Sepsis ([place]) 03/2015  . Shortness of breath dyspnea    with exertion  . Swallowing impairment    "Unable to swallow"  . Tremor 04/23/2014   Right hand    Past Surgical History:  Procedure Laterality Date  . BILATERAL EAR DRUM SURGERY  1960'S  . BILE  DUCT STENT PLACEMENT    . CATARACT EXTRACTION Bilateral   . ERCP N/A 08/09/2014   Procedure: ENDOSCOPIC RETROGRADE CHOLANGIOPANCREATOGRAPHY (ERCP);  Surgeon: Carol Ada, MD;  Location: [facility]OSCOPY;  Service: Endoscopy;  Laterality: N/A;  . ERCP N/A 05/23/2015   Procedure: ENDOSCOPIC RETROGRADE CHOLANGIOPANCREATOGRAPHY (ERCP);  Surgeon: Carol Ada, MD;  Location: [facility] ENDOSCOPY;  Service: Endoscopy;  Laterality: N/A;  . ERCP N/A 05/30/2015   Procedure: ENDOSCOPIC RETROGRADE CHOLANGIOPANCREATOGRAPHY (ERCP);  Surgeon: Carol Ada, MD;  Location: [facility];  Service: Endoscopy;  Laterality: N/A;  . ESOPHAGOGASTRODUODENOSCOPY (EGD) WITH PROPOFOL N/A 08/09/2014   Procedure: ESOPHAGOGASTRODUODENOSCOPY (EGD) WITH PROPOFOL;  Surgeon: Carol Ada, MD;  Location: [facility] ENDOSCOPY;  Service: Endoscopy;  Laterality: N/A;  . EUS N/A 08/09/2014   Procedure: UPPER ENDOSCOPIC ULTRASOUND (EUS) RADIAL;  Surgeon: Carol Ada, MD;  Location: [facility] ENDOSCOPY;  Service: Endoscopy;  Laterality: N/A;  . FINE NEEDLE ASPIRATION N/A 08/09/2014   Procedure: FINE NEEDLE ASPIRATION (FNA) LINEAR;  Surgeon: Carol Ada, MD;  Location: [facility] ENDOSCOPY;  Service: Endoscopy;  Laterality: N/A;  . GASTROSTOMY TUBE PLACEMENT  10/2014  . KNEE ARTHROSCOPY  12/21/2011   Procedure: ARTHROSCOPY KNEE;  Surgeon: Tobi Bastos, MD;  Location: [facility];  Service: Orthopedics;  Laterality: Right;  WITH MEDIAL MENISECTOMY  . LARYNGOSCOPY  01/2015 and 04/21/15  . ORIF LEFT RING FINGER  AND  REVASCULARIZATION OF RADIAL SIDE  02-16-2007   DEGLOVING INJURY  . [place] cath    . PORTACATH PLACEMENT  Removed 06/20/3015   as of 05-27-15 remains right chest  . ROTATOR CUFF REPAIR  2008   LEFT SHOULDER  . SHOULDER OPEN ROTATOR CUFF REPAIR Right 06/05/2014   Procedure: RIGHT ROTATOR CUFF REPAIR SHOULDER OPEN;  Surgeon: Latanya Maudlin, MD;  Location: [facility];  Service: Orthopedics;  Laterality: Right;  . TONSILLECTOMY     as child    There  were no vitals filed for this visit.      Subjective Assessment - 01/09/16 1151    Subjective Pt denied falls since last visit. Pt reported he's been able to amb. on the treadmill for approx. 9 minutes the last two days. Pt reported neck pain is considerably better, but still bothersome after sleeping wrong. Pt sleeps on a 7 degree wedge and slides down it and causes head to be a bad angle.  Pt reports neck pain 6-7/10 at worst and 0/10 at best.    Patient is accompained by: Family member  [name]-wife   Pertinent History metastatic lung cancer, orthostatis, dysphagia, hx of dizziness, R knee OA, hx of L knee menisus tear (per pt), CRF   Patient Stated Goals Get my overall strength built up   Currently in Pain? No/denies             Therex: Pt performed strengthening HEP with cues and S for technique and safety. Please see pt instructions for details. Pt attempted tricep dips in standing at mat, but reported incr. Diffculty. HEP kept in seated position as pt reported irritability at PEG tube site during standing and performing HEP.               Self Care:     PT Education - 01/09/16 1238    Education provided Yes   Education Details PT provided pt with UE HEP to improve strength and posture. PT recommended pt trial a pillow under knees while sleeping in attempt to stay in place, as he slides down wedge at night (sleeps on wedge 2/2 PEG tube) and it causes neck pain. PT also educated pt on f/u with MD regarding R hand fourth and fifth digits "freezing", as pt stated he thinks it could be arthritis but it appears to be more tendon related.  PT also recommended OT consult.    Person(s) Educated Patient;Spouse   Methods Explanation;Demonstration;Tactile cues;Verbal cues;Handout   Comprehension Returned demonstration;Verbalized understanding;Need further instruction          PT Short Term Goals - 12/19/15 1245      PT SHORT TERM GOAL #1   Title Pt will be IND in HEP  to improve strength, endurance, and balance. TARGET DATE FOR ALL STGS: 12/16/15   Status Partially Met     PT SHORT TERM GOAL #2   Title Perform DGI and write goals prn.    Baseline DGI =13   Status Achieved     PT SHORT TERM GOAL #3   Title Pt will amb. 200' with SPC and S to improve functional mobility, over even terrain.    Status Achieved     PT SHORT TERM GOAL #4   Title Pt will perform TUG with LRAD in </=13.5sec. to decr. falls risk.    Status Achieved           PT Long Term Goals - 12/26/15 1419      PT LONG TERM GOAL #1  Title Pt will improve 6MWT distance from 798' to 998' to improve endurance. TARGET DATE FOR ALL LTGS: 01/13/16   Status On-going     PT LONG TERM GOAL #2   Title Assess ability to amb. on treadmill and write goal if applicable and safe.   Status On-going     PT LONG TERM GOAL #3   Title Pt will amb. 100' without AD, with S, in order to amb. safely at home.    Status On-going     PT LONG TERM GOAL #4   Title Pt will amb. 400' over even/paved surfaces with LRAD at MOD I level in order to improve functional mobility.    Status On-going     PT LONG TERM GOAL #5   Title Assess stair climbing ability and write goals prn.    Status On-going     Additional Long Term Goals   Additional Long Term Goals Yes     PT LONG TERM GOAL #6   Title Pt will incr DGI to > 19/24 to allow pt to ambulate safely at home and in the community.     Baseline 13/24   Status On-going     PT LONG TERM GOAL #7   Title Pt will engage in all above activities with no more than 2-point increase in cervical spine pain to indicate decreased impact of pain on activity tolerance.     Status New               Plan - 01/09/16 1241    Clinical Impression Statement Pt demonstrated progress, as he was able to tolerate UE strengthening HEP without rest breaks. Pt was unable to hold theraband for therex 2/2 poor grip strength (limited by decr. grip in fourth and fifth  digits). PT reiterated the importance of correct upright posture to reduce cx pain. Continue with POC.    Rehab Potential Good   Clinical Impairments Affecting Rehab Potential Pt has metastatic lung cancer and is currently receiving treatment   PT Treatment/Interventions ADLs/Self Care Home Management;Canalith Repostioning;DME Instruction;Gait training;Stair training;Functional mobility training;Therapeutic activities;Therapeutic exercise;Balance training;Vestibular;Neuromuscular re-education;Patient/family education;Passive range of motion;Manual techniques   PT Next Visit Plan Modify UE strengthening HEP prn, check goals and likely d/c.    PT Home Exercise Plan sit to stands, SLS at counter, wall squat and walking program; 9/29: initiated HEP to address c-spine pain   Consulted and Agree with Plan of Care Patient;Family member/caregiver   Family Member Consulted wife, [name]      Patient will benefit from skilled therapeutic intervention in order to improve the following deficits and impairments:  Dizziness, Abnormal gait, Decreased mobility, Decreased endurance, Decreased balance, Postural dysfunction, Decreased strength, Pain  Visit Diagnosis: Muscle weakness (generalized)     Problem List Patient Active Problem List   Diagnosis Date Noted  . Rash 12/19/2015  . Neck pain 12/19/2015  . Chronic fatigue 11/10/2015  . Bacteremia due to Klebsiella pneumoniae 04/29/2015  . Infection due to Enterobacter cloacae 04/29/2015  . Hypokalemia 04/29/2015  . Gram-negative bacteremia 04/25/2015  . Septic shock ([place]) 04/23/2015  . Malignant neoplasm of lung ([place])   . Sepsis ([place])   . Acute kidney injury ([place])   . Abnormality of gait 03/12/2015  . Lung cancer ([place])   . Protein-calorie malnutrition, severe ([place]) 11/26/2014  . Hypoalbuminemia 11/18/2014  . Leukocytosis 11/18/2014  . Dysphagia 11/18/2014  . Central line complication 43/32/9518  . Anorexia 10/03/2014  . Weight loss 10/03/2014   . Transaminitis 10/03/2014  .  Hyperphosphatemia 10/03/2014  . Encounter for antineoplastic immunotherapy 09/28/2014  . Non-small cell lung cancer ([place]) 09/21/2014  . Abdominal pain   . Fever   . 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 Biliary obstruction   . Fatigue 08/27/2014  . Abdominal pain, acute   . Abdominal mass   . Abdominal pain, generalized 08/07/2014  . CRF (chronic renal failure) 08/07/2014  . Obstructive jaundice 08/07/2014  . Malnutrition of moderate degree ([place]) 08/07/2014  . Obstructive jaundice due to cancer ([place]) 08/06/2014  . Rotator cuff tear, non-traumatic 06/05/2014  . Dizziness and giddiness 04/23/2014  . Tremor 04/23/2014  . Acute on chronic renal failure ([place]) 05/24/2013  . Pancytopenia ([place]) 05/24/2013  . Osteoarthritis of right knee 12/21/2011  . Meniscus, medial, posterior horn derangement 12/21/2011  . Dyspnea 12/04/2010  . Bronchogenic cancer of left lung ([place]) 12/11/2008  . ARTHRITIS 12/10/2008    Savaya Hakes L 01/09/2016, 12:44 PM  [facility] [address] [place], [place], [address] Phone: [phone]   Fax:  [phone]  Name: Jaythan Hinely MRN: [id number] Date of Birth: 1946-02-02   Geoffry Paradise, PT,DPT 01/09/16 12:47 PM Phone: [phone] Fax: [phone]

## 2016-01-16 ENCOUNTER — Ambulatory Visit: Payer: Medicare Other | Admitting: Physical Therapy

## 2016-01-16 DIAGNOSIS — R2689 Other abnormalities of gait and mobility: Secondary | ICD-10-CM

## 2016-01-16 DIAGNOSIS — M6281 Muscle weakness (generalized): Secondary | ICD-10-CM

## 2016-01-16 NOTE — Therapy (Signed)
[facility] [address] [place], [place], [address] Phone: [phone]   Fax:  [phone]  Physical Therapy Treatment  Patient Details  Name: [name] MRN: [id number] Date of Birth: 1945/09/23 Referring Provider: Dr. Christella Noa  Encounter Date: 01/16/2016      PT End of Session - 01/16/16 2028    Visit Number 8   Number of Visits 9   Date for PT Re-Evaluation 01/17/16   Authorization Type Medicare - GCodes and progress notes required   PT Start Time 1146   PT Stop Time 1243   PT Time Calculation (min) 57 min      Past Medical History:  Diagnosis Date  . Abnormality of gait 03/12/2015  . Acute meniscal tear of knee RIGHT KNEE  . ARF (acute renal failure) ([place]) 03/2015   pre-renal patient was septic  . Arthritis RIGHT SHOULDER  . BPH (benign prostatic hypertrophy)   . Dizziness and giddiness 04/23/2014  . 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 HOH (hard of hearing)    bilateral hearing aids  . Hypertension   . Immature cataract BILATERAL  . Non-small cell lung cancer ([place]) DX SEPT 2010  W/ CHEMORADIATION AT THAT TIME -- NOW  W/ METS--  CURRENTLY ON MAINTENANCE CHEMO TX  EVERY 27 DAYS   ONCOLOGIST- DR [facility]- lung cancer with metastasis.  . Orthostatic hypotension    B/P tends to run 110/70 or less -using med to raise B/P.  Marland Kitchen Pneumonia   . Radiation 01/08/09-01/21/09   Mediastinum 30 Gy x 12 fractions  . Radiation 08/19/14-09/04/14   Palliative RT Porta hepatic LN 30 Gy  . Sepsis ([place]) 03/2015  . Shortness of breath dyspnea    with exertion  . Swallowing impairment    "Unable to swallow"  . Tremor 04/23/2014   Right hand    Past Surgical History:  Procedure Laterality Date  . BILATERAL EAR DRUM SURGERY  1960'S  . BILE DUCT STENT PLACEMENT    . CATARACT EXTRACTION Bilateral   . ERCP N/A 08/09/2014   Procedure: ENDOSCOPIC RETROGRADE  CHOLANGIOPANCREATOGRAPHY (ERCP);  Surgeon: Carol Ada, MD;  Location: [facility];  Service: Endoscopy;  Laterality: N/A;  . ERCP N/A 05/23/2015   Procedure: ENDOSCOPIC RETROGRADE CHOLANGIOPANCREATOGRAPHY (ERCP);  Surgeon: Carol Ada, MD;  Location: [facility] END[facility]COPY;  Service: Endoscopy;  Laterality: N/A;  . ERCP N/A 05/30/2015   Procedure: ENDOSCOPIC RETROGRADE CHOLANGIOPANCREATOGRAPHY (ERCP);  Surgeon: Carol Ada, MD;  Location: [facility];  Service: Endoscopy;  Laterality: N/A;  . ESOPHAGOGASTRODUODENOSCOPY (EGD) WITH PROPOFOL N/A 08/09/2014   Procedure: ESOPHAGOGASTRODUODENOSCOPY (EGD) WITH PROPOFOL;  Surgeon: Carol Ada, MD;  [facility];  Service: Endoscopy;  Laterality: N/A;  . EUS N/A 08/09/2014   Procedure: UPPER ENDOSCOPIC ULTRASOUND (EUS) RADIAL;  Surgeon: Carol Ada, MD;  Location: [facility];  Service: Endoscopy;  Laterality: N/A;  . FINE NEEDLE ASPIRATION N/A 08/09/2014   Procedure: FINE NEEDLE ASPIRATION (FNA) LINEAR;  Surgeon: Carol Ada, MD;  Location: [facility];  Service: Endoscopy;  Laterality: N/A;  . GASTROSTOMY TUBE PLACEMENT  10/2014  . KNEE ARTHROSCOPY  12/21/2011   Procedure: ARTHROSCOPY KNEE;  Surgeon: Tobi Bastos, MD;  Location: [facility];  Service: Orthopedics;  Laterality: Right;  WITH MEDIAL MENISECTOMY  . LARYNGOSCOPY  01/2015 and 04/21/15  . ORIF LEFT RING FINGER  AND REVASCULARIZATION OF RADIAL SIDE  02-16-2007   DEGLOVING INJURY  . [place] cath    .  PORTACATH PLACEMENT  Removed 06/20/3015   as of 05-27-15 remains right chest  . ROTATOR CUFF REPAIR  2008   LEFT SHOULDER  . SHOULDER OPEN ROTATOR CUFF REPAIR Right 06/05/2014   Procedure: RIGHT ROTATOR CUFF REPAIR SHOULDER OPEN;  Surgeon: Latanya Maudlin, MD;  Location: [facility];  Service: Orthopedics;  Laterality: Right;  . TONSILLECTOMY     as child    There were no vitals filed for this visit.      Subjective Assessment - 01/16/16 2022    Subjective Pt reports he is  doing better - says neck pain is improved- states he is scheduled for discharge today   Patient is accompained by: Family member   Pertinent History metastatic lung cancer, orthostatis, dysphagia, hx of dizziness, R knee OA, hx of L knee menisus tear (per pt), CRF   Patient Stated Goals Get my overall strength built up   Currently in Pain? No/denies                         [facility] Adult PT Treatment/Exercise - 01/16/16 0001      Ambulation/Gait   Ambulation/Gait Yes   Ambulation/Gait Assistance 5: Supervision   Ambulation/Gait Assistance Details in 6 MWT   Ambulation Distance (Feet) 906 Feet   Assistive device Straight cane   Gait Pattern Step-through pattern;Decreased stride length;Decreased trunk rotation;Decreased dorsiflexion - left;Decreased dorsiflexion - right   Ambulation Surface Level;Indoor   Stairs Yes   Stairs Assistance 5: Supervision   Stair Management Technique Two rails;Alternating pattern   Number of Stairs 4   Height of Stairs 6     Dynamic Gait Index   Level Surface Mild Impairment   Change in Gait Speed Mild Impairment   Gait with Horizontal Head Turns Mild Impairment   Gait with Vertical Head Turns Mild Impairment   Gait and Pivot Turn Normal   Step Over Obstacle Normal   Step Around Obstacles Normal   Steps Mild Impairment   Total Score 19     Knee/Hip Exercises: Standing   Other Standing Knee Exercises Instructed in hip flexion, abduction and extension strengthening exercises with green theraband for HEP     Knee/Hip Exercises: Seated   Long Arc Quad Strengthening;Right;Left;Both;1 set;10 reps  green theraband   Hamstring Curl Strengthening;Both;1 set;10 reps  green theraband     Self care - instructed/demonstrated correct technique in floor to stand transfer with UE support on object; pt declined to  Attempt due to c/o L knee discomfort/pain anticipated to occur in this position/activity   Discussed HEP, progress and LTG's with pt  and wife          PT Education - 01/16/16 2028    Education provided Yes   Education Details added green theraband exs to HEP - knee flexion/extension and hip abduction, ext and flexion   Person(s) Educated Patient;Spouse   Methods Explanation;Demonstration;Handout   Comprehension Verbalized understanding;Returned demonstration          PT Short Term Goals - 12/19/15 1245      PT SHORT TERM GOAL #1   Title Pt will be IND in HEP to improve strength, endurance, and balance. TARGET DATE FOR ALL STGS: 12/16/15   Status Partially Met     PT SHORT TERM GOAL #2   Title Perform DGI and write goals prn.    Baseline DGI =13   Status Achieved     PT SHORT TERM GOAL #3   Title Pt will amb.  200' with SPC and S to improve functional mobility, over even terrain.    Status Achieved     PT SHORT TERM GOAL #4   Title Pt will perform TUG with LRAD in </=13.5sec. to decr. falls risk.    Status Achieved           PT Long Term Goals - 2016-01-30 2027-06-08      PT LONG TERM GOAL #1   Title Pt will improve 6MWT distance from 798' to 998' to improve endurance. TARGET DATE FOR ALL LTGS: 01/13/16   Baseline 906' on 01-30-2016   Status Not Met     PT LONG TERM GOAL #2   Title Assess ability to amb. on treadmill and write goal if applicable and safe.   Baseline pt reporting amb. on treadmill at home   Status Achieved     PT LONG TERM GOAL #3   Title Pt will amb. 100' without AD, with S, in order to amb. safely at home.    Status Achieved     PT LONG TERM GOAL #4   Title Pt will amb. 400' over even/paved surfaces with LRAD at MOD I level in order to improve functional mobility.    Baseline met per pt report -- Jan 30, 2016   Status Achieved     PT LONG TERM GOAL #5   Title Assess stair climbing ability and write goals prn.    Status Achieved     PT LONG TERM GOAL #6   Title Pt will incr DGI to > 19/24 to allow pt to ambulate safely at home and in the community.     Baseline 19/24 on  2016-01-30   Status Achieved     PT LONG TERM GOAL #7   Title Pt will engage in all above activities with no more than 2-point increase in cervical spine pain to indicate decreased impact of pain on activity tolerance.     Baseline met per pt report on 01-30-16   Status Achieved               Plan - January 30, 2016 June 07, 2029    Clinical Impression Statement Pt met all LTG's except #1 - 6 MWT 906', not 998' per stated goal due to decreased gait velocity.  Pt reports he is pleased with status and is ready for D/C at this time.  Pt declined attempting floor to stand transfer due to c/o L knee discomfort - did not want to attempt bending L knee to get down on floor.   Rehab Potential Good   Clinical Impairments Affecting Rehab Potential Pt has metastatic lung cancer and is currently receiving treatment   PT Frequency 1x / week   PT Duration 8 weeks   PT Treatment/Interventions ADLs/Self Care Home Management;Canalith Repostioning;DME Instruction;Gait training;Stair training;Functional mobility training;Therapeutic activities;Therapeutic exercise;Balance training;Vestibular;Neuromuscular re-education;Patient/family education;Passive range of motion;Manual techniques   PT Next Visit Plan D/C   Consulted and Agree with Plan of Care Patient;Family member/caregiver   Family Member Consulted wife, [name]      Patient will benefit from skilled therapeutic intervention in order to improve the following deficits and impairments:  Dizziness, Abnormal gait, Decreased mobility, Decreased endurance, Decreased balance, Postural dysfunction, Decreased strength, Pain  Visit Diagnosis: Muscle weakness (generalized)  Other abnormalities of gait and mobility       G-Codes - 2016-01-30 07-Jun-2032    Functional Assessment Tool Used 6MWT 906'    Functional Limitation Mobility: Walking and moving around   Mobility: Walking and Moving Around Goal  Status [phone]) At least 20 percent but less than 40 percent impaired, limited  or restricted   Mobility: Walking and Moving Around Discharge Status [phone]) At least 20 percent but less than 40 percent impaired, limited or restricted      Problem List Patient Active Problem List   Diagnosis Date Noted  . Rash 12/19/2015  . Neck pain 12/19/2015  . Chronic fatigue 11/10/2015  . Bacteremia due to Klebsiella pneumoniae 04/29/2015  . Infection due to Enterobacter cloacae 04/29/2015  . Hypokalemia 04/29/2015  . Gram-negative bacteremia 04/25/2015  . Septic shock ([place]) 04/23/2015  . Malignant neoplasm of lung ([place])   . Sepsis ([place])   . Acute kidney injury ([place])   . Abnormality of gait 03/12/2015  . Lung cancer ([place])   . Protein-calorie malnutrition, severe ([place]) 11/26/2014  . Hypoalbuminemia 11/18/2014  . Leukocytosis 11/18/2014  . Dysphagia 11/18/2014  . Central line complication 93/81/0175  . Anorexia 10/03/2014  . Weight loss 10/03/2014  . Transaminitis 10/03/2014  . Hyperphosphatemia 10/03/2014  . Encounter for antineoplastic immunotherapy 09/28/2014  . Non-small cell lung cancer ([place]) 09/21/2014  . Abdominal pain   . Fever   . 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 Biliary obstruction   . Fatigue 08/27/2014  . Abdominal pain, acute   . Abdominal mass   . Abdominal pain, generalized 08/07/2014  . CRF (chronic renal failure) 08/07/2014  . Obstructive jaundice 08/07/2014  . Malnutrition of moderate degree ([place]) 08/07/2014  . Obstructive jaundice due to cancer ([place]) 08/06/2014  . Rotator cuff tear, non-traumatic 06/05/2014  . Dizziness and giddiness 04/23/2014  . Tremor 04/23/2014  . Acute on chronic renal failure ([place]) 05/24/2013  . Pancytopenia ([place]) 05/24/2013  . Osteoarthritis of right knee 12/21/2011  . Meniscus, medial, posterior horn derangement 12/21/2011  . Dyspnea 12/04/2010  . Bronchogenic cancer of left lung ([place]) 12/11/2008  . ARTHRITIS 12/10/2008    PHYSICAL THERAPY DISCHARGE SUMMARY  Visits from  Start of Care: 8  Current functional level related to goals / functional outcomes: See above for progress towards goals - LTG's met except for goal of 6MWT of 998' - not met due to decr. gait velocity and decr. endurance   Remaining deficits: Decreased strength, decr. Endurance and decr. high level balance skills; pt is now able to amb. with use of SPC for assistance with community amb.; Reports amb. In home without device   Education / Equipment: Pt has been instructed in HEP for UE and LE strengthening Plan: Patient agrees to discharge.  Patient goals were partially met. Patient is being discharged due to meeting the stated rehab goals.  ?????       ZWCHEN, IDPOE UMPNTIR, PT 01/16/2016, 8:38 PM  [facility] [address] [place], [place], [address] Phone: [phone]   Fax:  [phone]  Name: Leeman Johnsey MRN: [id number] Date of Birth: 24-Jul-1945

## 2016-01-16 NOTE — Patient Instructions (Signed)
Knee Flexion: Resisted (Sitting)    Sit with band under left foot and looped around ankle of supported leg. Pull unsupported leg back. Repeat ____ times per set. Do ____ sets per session. Do ____ sessions per day.  http://orth.exer.us/694   Copyright  VHI. All rights reserved.  Knee Extension: Resisted (Sitting)    With band looped around right ankle and under other foot, straighten leg with ankle loop. Keep other leg bent to increase resistance. Repeat ____ times per set. Do ____ sets per session. Do ____ sessions per day.  http://orth.exer.us/690   Copyright  VHI. All rights reserved.

## 2016-01-20 ENCOUNTER — Ambulatory Visit (HOSPITAL_COMMUNITY)
Admission: RE | Admit: 2016-01-20 | Discharge: 2016-01-20 | Disposition: A | Discharge status: Home / Self Care | Payer: Medicare Other | Source: Ambulatory Visit | Attending: Internal Medicine | Admitting: Internal Medicine

## 2016-01-20 ENCOUNTER — Encounter (HOSPITAL_COMMUNITY): Payer: Self-pay

## 2016-01-20 DIAGNOSIS — R21 Rash and other nonspecific skin eruption: Secondary | ICD-10-CM | POA: Insufficient documentation

## 2016-01-20 DIAGNOSIS — I700 Atherosclerosis of aorta: Secondary | ICD-10-CM | POA: Diagnosis not present

## 2016-01-20 DIAGNOSIS — C3492 Malignant neoplasm of unspecified part of left bronchus or lung: Secondary | ICD-10-CM | POA: Diagnosis present

## 2016-01-20 DIAGNOSIS — R740 Nonspecific elevation of levels of transaminase and lactic acid dehydrogenase [LDH]: Secondary | ICD-10-CM | POA: Diagnosis not present

## 2016-01-20 DIAGNOSIS — Z5112 Encounter for antineoplastic immunotherapy: Secondary | ICD-10-CM | POA: Insufficient documentation

## 2016-01-20 DIAGNOSIS — C786 Secondary malignant neoplasm of retroperitoneum and peritoneum: Secondary | ICD-10-CM | POA: Insufficient documentation

## 2016-01-20 DIAGNOSIS — R911 Solitary pulmonary nodule: Secondary | ICD-10-CM | POA: Diagnosis not present

## 2016-01-20 DIAGNOSIS — I2510 Atherosclerotic heart disease of native coronary artery without angina pectoris: Secondary | ICD-10-CM | POA: Insufficient documentation

## 2016-01-20 DIAGNOSIS — R7401 Elevation of levels of liver transaminase levels: Secondary | ICD-10-CM

## 2016-01-20 MED ORDER — IOPAMIDOL (ISOVUE-300) INJECTION 61%
100.0000 mL | Freq: Once | INTRAVENOUS | Status: AC | PRN
Start: 2016-01-20 — End: 2016-01-20
  Administered 2016-01-20: 100 mL via INTRAVENOUS

## 2016-01-22 ENCOUNTER — Other Ambulatory Visit (HOSPITAL_BASED_OUTPATIENT_CLINIC_OR_DEPARTMENT_OTHER): Payer: Medicare Other

## 2016-01-22 ENCOUNTER — Telehealth: Payer: Self-pay | Admitting: Internal Medicine

## 2016-01-22 ENCOUNTER — Other Ambulatory Visit: Payer: Self-pay | Admitting: Internal Medicine

## 2016-01-22 ENCOUNTER — Encounter: Payer: Self-pay | Admitting: Internal Medicine

## 2016-01-22 ENCOUNTER — Ambulatory Visit (HOSPITAL_BASED_OUTPATIENT_CLINIC_OR_DEPARTMENT_OTHER): Payer: Medicare Other

## 2016-01-22 ENCOUNTER — Ambulatory Visit (HOSPITAL_BASED_OUTPATIENT_CLINIC_OR_DEPARTMENT_OTHER): Payer: Medicare Other | Admitting: Internal Medicine

## 2016-01-22 VITALS — BP 98/69 | HR 82 | Temp 97.7°F | Resp 17 | Ht 66.0 in | Wt 138.6 lb

## 2016-01-22 DIAGNOSIS — Z5112 Encounter for antineoplastic immunotherapy: Secondary | ICD-10-CM | POA: Diagnosis present

## 2016-01-22 DIAGNOSIS — C3490 Malignant neoplasm of unspecified part of unspecified bronchus or lung: Secondary | ICD-10-CM | POA: Diagnosis present

## 2016-01-22 DIAGNOSIS — Z79899 Other long term (current) drug therapy: Secondary | ICD-10-CM

## 2016-01-22 DIAGNOSIS — C3492 Malignant neoplasm of unspecified part of left bronchus or lung: Secondary | ICD-10-CM

## 2016-01-22 DIAGNOSIS — C786 Secondary malignant neoplasm of retroperitoneum and peritoneum: Secondary | ICD-10-CM | POA: Diagnosis not present

## 2016-01-22 DIAGNOSIS — R5382 Chronic fatigue, unspecified: Secondary | ICD-10-CM

## 2016-01-22 LAB — COMPREHENSIVE METABOLIC PANEL
ALBUMIN: 3.3 g/dL — AB (ref 3.5–5.0)
ALK PHOS: 450 U/L — AB (ref 40–150)
ALT: 121 U/L — ABNORMAL HIGH (ref 0–55)
ANION GAP: 8 meq/L (ref 3–11)
AST: 84 U/L — ABNORMAL HIGH (ref 5–34)
BILIRUBIN TOTAL: 3.04 mg/dL — AB (ref 0.20–1.20)
BUN: 34.3 mg/dL — ABNORMAL HIGH (ref 7.0–26.0)
CO2: 21 mEq/L — ABNORMAL LOW (ref 22–29)
Calcium: 9.5 mg/dL (ref 8.4–10.4)
Chloride: 110 mEq/L — ABNORMAL HIGH (ref 98–109)
Creatinine: 1.2 mg/dL (ref 0.7–1.3)
EGFR: 58 mL/min/{1.73_m2} — AB (ref >90)
Glucose: 103 mg/dl (ref 70–140)
POTASSIUM: 4.8 meq/L (ref 3.5–5.1)
Sodium: 138 mEq/L (ref 136–145)
TOTAL PROTEIN: 7 g/dL (ref 6.4–8.3)

## 2016-01-22 LAB — CBC WITH DIFFERENTIAL/PLATELET
BASO%: 0.3 % (ref 0.0–2.0)
BASOS ABS: 0 10*3/uL (ref 0.0–0.1)
EOS ABS: 0.6 10*3/uL — AB (ref 0.0–0.5)
EOS%: 7.4 % — ABNORMAL HIGH (ref 0.0–7.0)
HCT: 37.8 % — ABNORMAL LOW (ref 38.4–49.9)
HEMOGLOBIN: 13.1 g/dL (ref 13.0–17.1)
LYMPH%: 13.3 % — AB (ref 14.0–49.0)
MCH: 36.3 pg — AB (ref 27.2–33.4)
MCHC: 34.7 g/dL (ref 32.0–36.0)
MCV: 104.7 fL — AB (ref 79.3–98.0)
MONO#: 1.1 10*3/uL — AB (ref 0.1–0.9)
MONO%: 14.5 % — ABNORMAL HIGH (ref 0.0–14.0)
NEUT%: 64.5 % (ref 39.0–75.0)
NEUTROS ABS: 4.8 10*3/uL (ref 1.5–6.5)
PLATELETS: 124 10*3/uL — AB (ref 140–400)
RBC: 3.61 10*6/uL — ABNORMAL LOW (ref 4.20–5.82)
RDW: 13.6 % (ref 11.0–14.6)
WBC: 7.5 10*3/uL (ref 4.0–10.3)
lymph#: 1 10*3/uL (ref 0.9–3.3)

## 2016-01-22 LAB — TSH: TSH: 2.444 m(IU)/L (ref 0.320–4.118)

## 2016-01-22 MED ORDER — SODIUM CHLORIDE 0.9 % IV SOLN
200.0000 mg | Freq: Once | INTRAVENOUS | Status: AC
  Administered 2016-01-22: 200 mg via INTRAVENOUS
  Filled 2016-01-22: qty 8

## 2016-01-22 MED ORDER — SODIUM CHLORIDE 0.9 % IV SOLN
Freq: Once | INTRAVENOUS | Status: AC
  Administered 2016-01-22: 12:00:00 via INTRAVENOUS

## 2016-01-22 NOTE — Telephone Encounter (Signed)
Appointments scheduled per 10/26 LOS. Patient left for infusion before receiving AVS report and calendars. Will pick them up in infusion.

## 2016-01-22 NOTE — Patient Instructions (Signed)
[facility] Discharge Instructions for Patients Receiving Chemotherapy  Today you received the following chemotherapy agents:  Keytruda.  To help prevent nausea and vomiting after your treatment, we encourage you to take your nausea medication as prescribed.   If you develop nausea and vomiting that is not controlled by your nausea medication, call the clinic.   BELOW ARE SYMPTOMS THAT SHOULD BE REPORTED IMMEDIATELY:  *FEVER GREATER THAN 100.5 F  *CHILLS WITH OR WITHOUT FEVER  NAUSEA AND VOMITING THAT IS NOT CONTROLLED WITH YOUR NAUSEA MEDICATION  *UNUSUAL SHORTNESS OF BREATH  *UNUSUAL BRUISING OR BLEEDING  TENDERNESS IN MOUTH AND THROAT WITH OR WITHOUT PRESENCE OF ULCERS  *URINARY PROBLEMS  *BOWEL PROBLEMS  UNUSUAL RASH Items with * indicate a potential emergency and should be followed up as soon as possible.  Feel free to call the clinic you have any questions or concerns. The clinic phone number is [phone].  Please show the CHEMO ALERT CARD at check-in to the Emergency Department and triage nurse.   

## 2016-01-22 NOTE — Progress Notes (Signed)
[place] Telephone:([phone]   Fax:(336) [place], MD [address] [place] [address]  DIAGNOSIS AND DIAGNOSIS: Metastatic non-small cell lung cancer adenocarcinoma diagnosed in September 2010 . PDL 1 expression 50%  PRIOR THERAPY:  #1 status post 6 cycles of systemic chemotherapy with carboplatin, Alimta and Avastin given every 3 weeks last dose given 04/23/2009 with disease stabilization.  #2 status post palliative radiotherapy to the mediastinum under the care of Dr. Pablo Ledger. The patient received a total dose of 3000 cGY and 12 fractions completed 01/21/2009.  #3 Maintenance chemotherapy with Alimta at 500 mg per meter squared and Avastin at 15 mg per kilogram given every 3 weeks status post 35 cycles.  #4 Maintenance chemotherapy with Alimta 500 mg/M2 and Avastin 15 mg/kg every 4 weeks,status post 17 cycles, discontinued secondary to disease progression.  #5 Systemic chemotherapy with carboplatin for AUC of 5, Alimta 500 mg/M2 and Avastin 15 mg/kg every 3 weeks, status post 3 cycles, last cycle was given 12/28/2012. Carboplatin was discontinued starting cycle #2 secondary to hypersensitivity reaction. #6 Maintenance chemotherapy again with Alimta 500 mg/M2 and Avastin 15 mg/kg every 3 weeks, first cycle 01/23/2013. Status post 6 cycles. #7 palliative radiotherapy to the large abdominal mass under the care of Dr. Pablo Ledger. #8  Immunotherapy with Nivolumab 3 MG/KG every 2 weeks. First dose 10/04/2014. Status post 3 cycles discontinued secondary to disease progression. #9. Status post palliative radiotherapy to the progressive retroperitoneal lymph nodes under the care of Dr. Sondra Come.  CURRENT THERAPY: Ketruda 200 mg IV every 3 weeks. First dose on 11/20/2015. Status post 3 cycles.  CODE STATUS: No CODE BLUE  INTERVAL HISTORY: [name] 70 y.o. male returns to the clinic today for follow-up visit  accompanied by his wife. The patient is feeling much better today. He tolerated the second cycle of immunotherapy with Ketruda (pembrolizumab) fairly well with no significant adverse effects. The previously seen skin rash has resolved. He denied having any significant nausea, vomiting, diarrhea or constipation. He has no chest pain, shortness breath, cough or hemoptysis. He had repeat CT scan of the chest, abdomen and pelvis performed recently and he is here for evaluation and discussion of his scan results.  MEDICAL HISTORY: Past Medical History:  Diagnosis Date  . Abnormality of gait 03/12/2015  . Acute meniscal tear of knee RIGHT KNEE  . ARF (acute renal failure) ([place]) 03/2015   pre-renal patient was septic  . Arthritis RIGHT SHOULDER  . BPH (benign prostatic hypertrophy)   . Dizziness and giddiness 04/23/2014  . 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 HOH (hard of hearing)    bilateral hearing aids  . Hypertension   . Immature cataract BILATERAL  . Non-small cell lung cancer ([place]) DX SEPT 2010  W/ CHEMORADIATION AT THAT TIME -- NOW  W/ METS--  CURRENTLY ON MAINTENANCE CHEMO TX  EVERY 55 DAYS   ONCOLOGIST- DR [facility]- lung cancer with metastasis.  . Orthostatic hypotension    B/P tends to run 110/70 or less -using med to raise B/P.  Marland Kitchen Pneumonia   . Radiation 01/08/09-01/21/09   Mediastinum 30 Gy x 12 fractions  . Radiation 08/19/14-09/04/14   Palliative RT Porta hepatic LN 30 Gy  . Sepsis ([place]) 03/2015  . Shortness of breath dyspnea    with exertion  . Swallowing impairment    "Unable to swallow"  .  Tremor 04/23/2014   Right hand    ALLERGIES:  is allergic to fentanyl; augmentin [amoxicillin-pot clavulanate]; levaquin [levofloxacin]; percocet [oxycodone-acetaminophen]; scopolamine; zolpidem tartrate; and carboplatin.  MEDICATIONS:  Current Outpatient Prescriptions  Medication Sig Dispense Refill  . b complex vitamins tablet  Take 1 tablet by mouth daily.    Marland Kitchen dicyclomine (BENTYL) 20 MG tablet Take 20 mg by mouth every 6 (six) hours as needed (for abdominal cramps).    . diphenhydramine-acetaminophen (TYLENOL PM) 25-500 MG TABS tablet Take 1 tablet by mouth at bedtime as needed (for sleep).     Marland Kitchen HYDROmorphone (DILAUDID) 2 MG tablet Take 2-4 mg by mouth every 4 (four) hours as needed for severe pain.    Marland Kitchen LORazepam (ATIVAN) 1 MG tablet Take 0.5-1 mg by mouth every 4 (four) hours as needed for anxiety (and/or nausea).    . midodrine (PROAMATINE) 5 MG tablet Take 5 mg by mouth 3 (three) times daily with meals.    . Multiple Vitamin (MULTIVITAMIN WITH MINERALS) TABS tablet Take 1 tablet by mouth daily.    . mupirocin ointment (BACTROBAN) 2 % Apply 1 application topically 3 (three) times daily. Pt applies around tube area.  0  . naproxen sodium (ANAPROX) 220 MG tablet Take 220-440 mg by mouth 2 (two) times daily as needed (for pain).     . Nutritional Supplements (FEEDING SUPPLEMENT, OSMOLITE 1.5 CAL,) LIQD Place 237 mLs into feeding tube 3 (three) times daily.     . Nutritional Supplements (TWOCAL HN) LIQD Place 237 mLs into feeding tube 3 (three) times daily.    Marland Kitchen omeprazole (PRILOSEC) 40 MG capsule Take 40 mg by mouth at bedtime.     . ondansetron (ZOFRAN) 8 MG tablet Take 1 tablet (8 mg total) by mouth every 8 (eight) hours as needed for nausea or vomiting. 30 tablet 0  . promethazine (PHENERGAN) 25 MG tablet Take 25 mg by mouth every 6 (six) hours as needed for nausea or vomiting.    . senna (SENOKOT) 8.6 MG tablet Take 1 tablet by mouth daily.     No current facility-administered medications for this visit.     SURGICAL HISTORY:  Past Surgical History:  Procedure Laterality Date  . BILATERAL EAR DRUM SURGERY  1960'S  . BILE DUCT STENT PLACEMENT    . CATARACT EXTRACTION Bilateral   . ERCP N/A 08/09/2014   Procedure: ENDOSCOPIC RETROGRADE CHOLANGIOPANCREATOGRAPHY (ERCP);  Surgeon: Carol Ada, MD;  Location: [facility]  ENDOSCOPY;  Service: Endoscopy;  Laterality: N/A;  . ERCP N/A 05/23/2015   Procedure: ENDOSCOPIC RETROGRADE CHOLANGIOPANCREATOGRAPHY (ERCP);  Surgeon: Carol Ada, MD;  Location: [facility] ENDOSCOPY;  Service: Endoscopy;  Laterality: N/A;  . ERCP N/A 05/30/2015   Procedure: ENDOSCOPIC RETROGRADE CHOLANGIOPANCREATOGRAPHY (ERCP);  Surgeon: Carol Ada, MD;  Location: [facility] ENDOSCOPY;  Service: Endoscopy;  Laterality: N/A;  . ESOPHAGOGASTRODUODENOSCOPY (EGD) WITH PROPOFOL N/A 08/09/2014   Procedure: ESOPHAGOGASTRODUODENOSCOPY (EGD) WITH PROPOFOL;  Surgeon: Carol Ada, MD;  Location: [facility] ENDOSCOPY;  Service: Endoscopy;  Laterality: N/A;  . EUS N/A 08/09/2014   Procedure: UPPER ENDOSCOPIC ULTRASOUND (EUS) RADIAL;  Surgeon: Carol Ada, MD;  Location: [facility] ENDOSCOPY;  Service: Endoscopy;  Laterality: N/A;  . FINE NEEDLE ASPIRATION N/A 08/09/2014   Procedure: FINE NEEDLE ASPIRATION (FNA) LINEAR;  Surgeon: Carol Ada, MD;  Location: [facility] ENDOSCOPY;  Service: Endoscopy;  Laterality: N/A;  . GASTROSTOMY TUBE PLACEMENT  10/2014  . KNEE ARTHROSCOPY  12/21/2011   Procedure: ARTHROSCOPY KNEE;  Surgeon: Tobi Bastos, MD;  Location: [facility];  Service: Orthopedics;  Laterality: Right;  WITH MEDIAL MENISECTOMY  . LARYNGOSCOPY  01/2015 and 04/21/15  . ORIF LEFT RING FINGER  AND REVASCULARIZATION OF RADIAL SIDE  02-16-2007   DEGLOVING INJURY  . [place] cath    . PORTACATH PLACEMENT  Removed 06/20/3015   as of 05-27-15 remains right chest  . ROTATOR CUFF REPAIR  2008   LEFT SHOULDER  . SHOULDER OPEN ROTATOR CUFF REPAIR Right 06/05/2014   Procedure: RIGHT ROTATOR CUFF REPAIR SHOULDER OPEN;  Surgeon: Latanya Maudlin, MD;  Location: [facility] ORS;  Service: Orthopedics;  Laterality: Right;  . TONSILLECTOMY     as child    REVIEW OF SYSTEMS:  Constitutional: positive for fatigue Eyes: negative Ears, nose, mouth, throat, and face: negative Respiratory: positive for dyspnea on exertion Cardiovascular:  negative Gastrointestinal: negative Genitourinary:negative Integument/breast: negative Hematologic/lymphatic: negative Musculoskeletal:positive for muscle weakness Neurological: negative Behavioral/Psych: negative Endocrine: negative Allergic/Immunologic: negative   PHYSICAL EXAMINATION: General appearance: alert, cooperative, cachectic, fatigued and no distress Head: Normocephalic, without obvious abnormality, atraumatic Neck: no adenopathy, no JVD, supple, symmetrical, trachea midline and thyroid not enlarged, symmetric, no tenderness/mass/nodules Lymph nodes: Cervical, supraclavicular, and axillary nodes normal. Resp: clear to auscultation bilaterally Back: symmetric, no curvature. ROM normal. No CVA tenderness. Cardio: regular rate and rhythm, S1, S2 normal, no murmur, click, rub or gallop GI: soft, non-tender; bowel sounds normal; no masses,  no organomegaly Extremities: extremities normal, atraumatic, no cyanosis or edema Neurologic: Alert and oriented X 3, normal strength and tone. Normal symmetric reflexes. Normal coordination and gait  ECOG PERFORMANCE STATUS: 1 - Symptomatic but completely ambulatory  Blood pressure 98/69, pulse 82, temperature 97.7 F (36.5 C), temperature source Oral, resp. rate 17, height 5' 6"  (1.676 m), weight 138 lb 9.6 oz (62.9 kg), SpO2 98 %.  LABORATORY DATA: Lab Results  Component Value Date   WBC 7.5 01/22/2016   HGB 13.1 01/22/2016   HCT 37.8 (L) 01/22/2016   MCV 104.7 (H) 01/22/2016   PLT 124 (L) 01/22/2016      Chemistry      Component Value Date/Time   NA 138 01/22/2016 1010   K 4.8 01/22/2016 1010   CL 104 10/26/2015 1715   CL 109 (H) 08/29/2012 0856   CO2 21 (L) 01/22/2016 1010   BUN 34.3 (H) 01/22/2016 1010   CREATININE 1.2 01/22/2016 1010      Component Value Date/Time   CALCIUM 9.5 01/22/2016 1010   ALKPHOS 450 (H) 01/22/2016 1010   AST 84 (H) 01/22/2016 1010   ALT 121 (H) 01/22/2016 1010   BILITOT 3.04 (H) 01/22/2016  1010       RADIOGRAPHIC STUDIES: Ct Chest W Contrast  Result Date: 01/20/2016 CLINICAL DATA:  Non-small cell lung cancer. EXAM: CT CHEST, ABDOMEN, AND PELVIS WITH CONTRAST TECHNIQUE: Multidetector CT imaging of the chest, abdomen and pelvis was performed following the standard protocol during bolus administration of intravenous contrast. CONTRAST:  167m ISOVUE-300 IOPAMIDOL (ISOVUE-300) INJECTION 61% COMPARISON:  08/19/2015 FINDINGS: CT CHEST FINDINGS Cardiovascular: Normal heart size. There is no pericardial effusion. Calcification in the RCA, LAD and left circumflex coronary artery. Mediastinum/Nodes: The trachea appears patent and is midline. Right paratracheal adenopathy is stable measuring 1.6 cm, image 16 of series 2. Normal appearance of the esophagus. Lungs/Pleura: No pleural effusion identified. Moderate changes of centrilobular and paraseptal emphysema. Para mediastinal radiation change is identified within both lungs. Subpleural nodule in the left upper lobe measures 3 mm and is unchanged from previous exam, image 42 of series 6. Musculoskeletal:  No aggressive lytic or sclerotic bone lesions identified. CT ABDOMEN PELVIS FINDINGS Hepatobiliary: No focal liver abnormality is seen. Status post cholecystectomy. No biliary dilatation. There is a stent within the common bile duct. Pneumobilia is identified compatible with biliary patency. Pancreas: Unremarkable. No pancreatic ductal dilatation or surrounding inflammatory changes. Spleen: Normal in size without focal abnormality. Adrenals/Urinary Tract: The adrenal glands appear normal. Normal appearance of the right kidney. Left kidney cyst measures 1.2 cm, image 15 series 7. Urinary bladder appears normal. Stomach/Bowel: Gastrostomy tube is identified. Stomach is within normal limits. Appendix appears normal. No evidence of bowel wall thickening, distention, or inflammatory changes.Gastrostomy tube is identified. Vascular/Lymphatic: Aortic  atherosclerosis. The retrocaval/right sub less muscle mass measures 1.3 x 2.4 cm, image 77 of series 2. Previously this measured 3.8 x 5.3 cm. Index left periaortic node measures 6 mm, image 74 of series 2. Previously 1.2 cm. Reproductive: Prostate is unremarkable. Other: No abdominal wall hernia or abnormality. No abdominopelvic ascites. Musculoskeletal: No aggressive lytic or sclerotic bone lesions degenerative disc disease is present within the lumbar spine. IMPRESSION: 1. Stable appearance of right paratracheal adenopathy. 2. Retroperitoneal metastatic disease has decreased in size in the interval including the retrocaval/psoas mass and paraaortic adenopathy. 3. Stable small pulmonary nodule. 4. Aortic atherosclerosis and coronary artery calcifications. Electronically Signed   By: Kerby Moors M.D.   On: 01/20/2016 13:18   Ct Abdomen Pelvis W Contrast  Result Date: 01/20/2016 CLINICAL DATA:  Non-small cell lung cancer. EXAM: CT CHEST, ABDOMEN, AND PELVIS WITH CONTRAST TECHNIQUE: Multidetector CT imaging of the chest, abdomen and pelvis was performed following the standard protocol during bolus administration of intravenous contrast. CONTRAST:  152m ISOVUE-300 IOPAMIDOL (ISOVUE-300) INJECTION 61% COMPARISON:  08/19/2015 FINDINGS: CT CHEST FINDINGS Cardiovascular: Normal heart size. There is no pericardial effusion. Calcification in the RCA, LAD and left circumflex coronary artery. Mediastinum/Nodes: The trachea appears patent and is midline. Right paratracheal adenopathy is stable measuring 1.6 cm, image 16 of series 2. Normal appearance of the esophagus. Lungs/Pleura: No pleural effusion identified. Moderate changes of centrilobular and paraseptal emphysema. Para mediastinal radiation change is identified within both lungs. Subpleural nodule in the left upper lobe measures 3 mm and is unchanged from previous exam, image 42 of series 6. Musculoskeletal: No aggressive lytic or sclerotic bone lesions  identified. CT ABDOMEN PELVIS FINDINGS Hepatobiliary: No focal liver abnormality is seen. Status post cholecystectomy. No biliary dilatation. There is a stent within the common bile duct. Pneumobilia is identified compatible with biliary patency. Pancreas: Unremarkable. No pancreatic ductal dilatation or surrounding inflammatory changes. Spleen: Normal in size without focal abnormality. Adrenals/Urinary Tract: The adrenal glands appear normal. Normal appearance of the right kidney. Left kidney cyst measures 1.2 cm, image 15 series 7. Urinary bladder appears normal. Stomach/Bowel: Gastrostomy tube is identified. Stomach is within normal limits. Appendix appears normal. No evidence of bowel wall thickening, distention, or inflammatory changes.Gastrostomy tube is identified. Vascular/Lymphatic: Aortic atherosclerosis. The retrocaval/right sub less muscle mass measures 1.3 x 2.4 cm, image 77 of series 2. Previously this measured 3.8 x 5.3 cm. Index left periaortic node measures 6 mm, image 74 of series 2. Previously 1.2 cm. Reproductive: Prostate is unremarkable. Other: No abdominal wall hernia or abnormality. No abdominopelvic ascites. Musculoskeletal: No aggressive lytic or sclerotic bone lesions degenerative disc disease is present within the lumbar spine. IMPRESSION: 1. Stable appearance of right paratracheal adenopathy. 2. Retroperitoneal metastatic disease has decreased in size in the interval including the retrocaval/psoas mass and paraaortic adenopathy. 3. Stable small  pulmonary nodule. 4. Aortic atherosclerosis and coronary artery calcifications. Electronically Signed   By: Kerby Moors M.D.   On: 01/20/2016 13:18    ASSESSMENT AND PLAN: This is a very pleasant 70 years old white male with a stage IV non-small cell lung cancer diagnosed in September 2010 status post several chemotherapy regimens including immunotherapy which was discontinued recently secondary to disease progression. Over the last few  months he patient has significant improvement in his condition. He requested reevaluation for treatment. The recent CT scan of the chest, abdomen and pelvis showed response to therapy of the right paratracheal adenopathy but there was progression of the retroperitoneal metastatic disease. The final pathology from the CT-guided core biopsy of the enlarging retroperitoneal lymphadenopathy was consistent with metastatic poorly differentiated carcinoma. Molecular studies were negative for EGFR, ALK and ROS 1. PDL 1 expression is 50%.   He is currently on immunotherapy with Ketruda (pembrolizumab) status post 3 cycles. The recent CT scan of the chest, abdomen and pelvis showed stable disease with decrease in the retroperitoneal metastatic disease. I discussed the scan results with the patient and his wife. I recommended for him to continue his treatment with [place] (pembrolizumab) as a scheduled and he will receive cycle #4 today. The patient would come back for follow-up visit in 3 weeks for evaluation before starting cycle #5. He was advised to call immediately if he has any concerning symptoms. The patient voices understanding of current disease status and treatment options and is in agreement with the current care plan.  All questions were answered. The patient knows to call the clinic with any problems, questions or concerns. We can certainly see the patient much sooner if necessary.  Disclaimer: This note was dictated with voice recognition software. Similar sounding words can inadvertently be transcribed and may not be corrected upon review.

## 2016-01-23 ENCOUNTER — Telehealth: Payer: Self-pay | Admitting: *Deleted

## 2016-01-23 NOTE — Telephone Encounter (Signed)
Per LOS I have scheduled appts and notified the scheduler 

## 2016-02-12 ENCOUNTER — Encounter: Payer: Medicare Other | Admitting: Nutrition

## 2016-02-12 ENCOUNTER — Ambulatory Visit: Payer: Medicare Other

## 2016-02-12 ENCOUNTER — Other Ambulatory Visit (HOSPITAL_BASED_OUTPATIENT_CLINIC_OR_DEPARTMENT_OTHER): Payer: Medicare Other

## 2016-02-12 ENCOUNTER — Encounter: Payer: Self-pay | Admitting: Internal Medicine

## 2016-02-12 ENCOUNTER — Telehealth: Payer: Self-pay | Admitting: *Deleted

## 2016-02-12 ENCOUNTER — Ambulatory Visit (HOSPITAL_BASED_OUTPATIENT_CLINIC_OR_DEPARTMENT_OTHER): Payer: Medicare Other | Admitting: Internal Medicine

## 2016-02-12 VITALS — BP 139/86 | HR 75 | Temp 97.6°F | Resp 18 | Ht 66.0 in | Wt 140.3 lb

## 2016-02-12 DIAGNOSIS — C3411 Malignant neoplasm of upper lobe, right bronchus or lung: Secondary | ICD-10-CM

## 2016-02-12 DIAGNOSIS — Z79899 Other long term (current) drug therapy: Secondary | ICD-10-CM

## 2016-02-12 DIAGNOSIS — C786 Secondary malignant neoplasm of retroperitoneum and peritoneum: Secondary | ICD-10-CM

## 2016-02-12 DIAGNOSIS — C3490 Malignant neoplasm of unspecified part of unspecified bronchus or lung: Secondary | ICD-10-CM | POA: Diagnosis present

## 2016-02-12 DIAGNOSIS — R5382 Chronic fatigue, unspecified: Secondary | ICD-10-CM

## 2016-02-12 DIAGNOSIS — C3492 Malignant neoplasm of unspecified part of left bronchus or lung: Secondary | ICD-10-CM

## 2016-02-12 DIAGNOSIS — K831 Obstruction of bile duct: Secondary | ICD-10-CM

## 2016-02-12 LAB — COMPREHENSIVE METABOLIC PANEL
ALT: 119 U/L — AB (ref 0–55)
AST: 85 U/L — AB (ref 5–34)
Albumin: 3.1 g/dL — ABNORMAL LOW (ref 3.5–5.0)
Alkaline Phosphatase: 456 U/L — ABNORMAL HIGH (ref 40–150)
Anion Gap: 11 mEq/L (ref 3–11)
BILIRUBIN TOTAL: 5.12 mg/dL — AB (ref 0.20–1.20)
BUN: 33.7 mg/dL — ABNORMAL HIGH (ref 7.0–26.0)
CO2: 19 meq/L — AB (ref 22–29)
Calcium: 9.4 mg/dL (ref 8.4–10.4)
Chloride: 109 mEq/L (ref 98–109)
Creatinine: 1.1 mg/dL (ref 0.7–1.3)
EGFR: 66 mL/min/{1.73_m2} — AB (ref >90)
GLUCOSE: 120 mg/dL (ref 70–140)
Potassium: 4.6 mEq/L (ref 3.5–5.1)
SODIUM: 139 meq/L (ref 136–145)
TOTAL PROTEIN: 6.5 g/dL (ref 6.4–8.3)

## 2016-02-12 LAB — CBC WITH DIFFERENTIAL/PLATELET
BASO%: 0.4 % (ref 0.0–2.0)
Basophils Absolute: 0 10*3/uL (ref 0.0–0.1)
EOS%: 4.7 % (ref 0.0–7.0)
Eosinophils Absolute: 0.3 10*3/uL (ref 0.0–0.5)
HCT: 37.6 % — ABNORMAL LOW (ref 38.4–49.9)
HGB: 12.6 g/dL — ABNORMAL LOW (ref 13.0–17.1)
LYMPH%: 11.2 % — AB (ref 14.0–49.0)
MCH: 35.9 pg — ABNORMAL HIGH (ref 27.2–33.4)
MCHC: 33.5 g/dL (ref 32.0–36.0)
MCV: 106.9 fL — AB (ref 79.3–98.0)
MONO#: 0.9 10*3/uL (ref 0.1–0.9)
MONO%: 15.2 % — AB (ref 0.0–14.0)
NEUT%: 68.5 % (ref 39.0–75.0)
NEUTROS ABS: 4.1 10*3/uL (ref 1.5–6.5)
Platelets: 115 10*3/uL — ABNORMAL LOW (ref 140–400)
RBC: 3.52 10*6/uL — AB (ref 4.20–5.82)
RDW: 13.4 % (ref 11.0–14.6)
WBC: 5.9 10*3/uL (ref 4.0–10.3)
lymph#: 0.7 10*3/uL — ABNORMAL LOW (ref 0.9–3.3)

## 2016-02-12 LAB — TSH: TSH: 2.451 m[IU]/L (ref 0.320–4.118)

## 2016-02-12 NOTE — Telephone Encounter (Signed)
Call to Dr. Ulyses Amor office transferred to RN's desk left message per MD pt will need stents checked again and check placement. Requested Desk RN call back to confirm.

## 2016-02-12 NOTE — Progress Notes (Signed)
[place] [phone]   Fax:([phone]) [place], [place] [address] [place] [address]  DIAGNOSIS AND DIAGNOSIS: Metastatic non-small cell lung cancer adenocarcinoma diagnosed in September 2010 . PDL 1 expression 50%  PRIOR THERAPY:  #1 status post 6 cycles of systemic chemotherapy with carboplatin, Alimta and Avastin given every 3 weeks last dose given 04/23/2009 with disease stabilization.  #2 status post palliative radiotherapy to the mediastinum under the care of Dr. Pablo Ledger. The patient received a total dose of 3000 cGY and 12 fractions completed 01/21/2009.  #3 Maintenance chemotherapy with Alimta at 500 mg per meter squared and Avastin at 15 mg per kilogram given every 3 weeks status post 35 cycles.  #4 Maintenance chemotherapy with Alimta 500 mg/M2 and Avastin 15 mg/kg every 4 weeks,status post 17 cycles, discontinued secondary to disease progression.  #5 Systemic chemotherapy with carboplatin for AUC of 5, Alimta 500 mg/M2 and Avastin 15 mg/kg every 3 weeks, status post 3 cycles, last cycle was given 12/28/2012. Carboplatin was discontinued starting cycle #2 secondary to hypersensitivity reaction. #6 Maintenance chemotherapy again with Alimta 500 mg/M2 and Avastin 15 mg/kg every 3 weeks, first cycle 01/23/2013. Status post 6 cycles. #7 palliative radiotherapy to the large abdominal mass under the care of Dr. Pablo Ledger. #8  Immunotherapy with Nivolumab 3 MG/KG every 2 weeks. First dose 10/04/2014. Status post 3 cycles discontinued secondary to disease progression. #9. Status post palliative radiotherapy to the progressive retroperitoneal lymph nodes under the care of Dr. Sondra Come.  CURRENT THERAPY: Ketruda 200 mg IV every 3 weeks. First dose on 11/20/2015. Status post 4 cycles.  CODE STATUS: No CODE BLUE  INTERVAL HISTORY: [name] 70 y.o. male returns to the clinic today for follow-up visit  accompanied by his wife. The patient is complaining of fatigue and pain on the right side of the neck that has been going on for several weeks. He also has increasing itching. He tolerated the second cycle of immunotherapy with Ketruda (pembrolizumab) fairly well with no significant adverse effects. He denied having any significant nausea, vomiting, diarrhea or constipation. He has no chest pain, shortness of breath, cough or hemoptysis. He is here today for evaluation before starting cycle #5 of his treatment with immunotherapy.  MEDICAL HISTORY: Past Medical History:  Diagnosis Date  . Abnormality of gait 03/12/2015  . Acute meniscal tear of knee RIGHT KNEE  . ARF (acute renal failure) ([facility]) 03/2015   pre-renal patient was septic  . Arthritis RIGHT SHOULDER  . BPH (benign prostatic hypertrophy)   . Dizziness and giddiness 04/23/2014  . 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 HOH (hard of hearing)    bilateral hearing aids  . Hypertension   . Immature cataract BILATERAL  . Non-small cell lung cancer ([facility]) DX SEPT 2010  W/ CHEMORADIATION AT THAT TIME -- NOW  W/ METS--  CURRENTLY ON MAINTENANCE CHEMO TX  EVERY 54 DAYS   ONCOLOGIST- [facility]- lung cancer with metastasis.  . Orthostatic hypotension    B/P tends to run 110/70 or less -using med to raise B/P.  Marland Kitchen Pneumonia   . Radiation 01/08/09-01/21/09   Mediastinum 30 Gy x 12 fractions  . Radiation 08/19/14-09/04/14   Palliative RT Porta hepatic LN 30 Gy  . Sepsis ([facility]) 03/2015  . Shortness of breath dyspnea    with exertion  . Swallowing impairment    "  Unable to swallow"  . Tremor 04/23/2014   Right hand    ALLERGIES:  is allergic to fentanyl; augmentin [amoxicillin-pot clavulanate]; levaquin [levofloxacin]; percocet [oxycodone-acetaminophen]; scopolamine; zolpidem tartrate; and carboplatin.  MEDICATIONS:  Current Outpatient Prescriptions  Medication Sig Dispense Refill  . b  complex vitamins tablet Take 1 tablet by mouth daily.    Marland Kitchen dicyclomine (BENTYL) 20 MG tablet Take 20 mg by mouth every 6 (six) hours as needed (for abdominal cramps).    . diphenhydramine-acetaminophen (TYLENOL PM) 25-500 MG TABS tablet Take 1 tablet by mouth at bedtime as needed (for sleep).     Marland Kitchen HYDROmorphone (DILAUDID) 2 MG tablet Take 2-4 mg by mouth every 4 (four) hours as needed for severe pain.    Marland Kitchen LORazepam (ATIVAN) 1 MG tablet Take 0.5-1 mg by mouth every 4 (four) hours as needed for anxiety (and/or nausea).    . midodrine (PROAMATINE) 5 MG tablet Take 5 mg by mouth 3 (three) times daily with meals.    . Multiple Vitamin (MULTIVITAMIN WITH MINERALS) TABS tablet Take 1 tablet by mouth daily.    . mupirocin ointment (BACTROBAN) 2 % Apply 1 application topically 3 (three) times daily. Pt applies around tube area.  0  . naproxen sodium (ANAPROX) 220 MG tablet Take 220-440 mg by mouth 2 (two) times daily as needed (for pain).     . Nutritional Supplements (FEEDING SUPPLEMENT, OSMOLITE 1.5 CAL,) LIQD Place 237 mLs into feeding tube 3 (three) times daily.     . Nutritional Supplements (TWOCAL HN) LIQD Place 237 mLs into feeding tube 3 (three) times daily.    Marland Kitchen omeprazole (PRILOSEC) 40 MG capsule Take 40 mg by mouth at bedtime.     . ondansetron (ZOFRAN) 8 MG tablet Take 1 tablet (8 mg total) by mouth every 8 (eight) hours as needed for nausea or vomiting. 30 tablet 0  . promethazine (PHENERGAN) 25 MG tablet Take 25 mg by mouth every 6 (six) hours as needed for nausea or vomiting.    . senna (SENOKOT) 8.6 MG tablet Take 1 tablet by mouth daily.     No current facility-administered medications for this visit.     SURGICAL HISTORY:  Past Surgical History:  Procedure Laterality Date  . BILATERAL EAR DRUM SURGERY  1960'S  . BILE DUCT STENT PLACEMENT    . CATARACT EXTRACTION Bilateral   . ERCP N/A 08/09/2014   Procedure: ENDOSCOPIC RETROGRADE CHOLANGIOPANCREATOGRAPHY (ERCP);  Surgeon: Carol Ada, MD;  Location: [facility];  Service: Endoscopy;  Laterality: N/A;  . ERCP N/A 05/23/2015   Procedure: ENDOSCOPIC RETROGRADE CHOLANGIOPANCREATOGRAPHY (ERCP);  Surgeon: Carol Ada, MD;  Location: [facility] ENDOSCOPY;  Service: Endoscopy;  Laterality: N/A;  . ERCP N/A 05/30/2015   Procedure: ENDOSCOPIC RETROGRADE CHOLANGIOPANCREATOGRAPHY (ERCP);  Surgeon: Carol Ada, MD;  Location: [facility];  Service: Endoscopy;  Laterality: N/A;  . ESOPHAGOGASTRODUODENOSCOPY (EGD) WITH PROPOFOL N/A 08/09/2014   Procedure: ESOPHAGOGASTRODUODENOSCOPY (EGD) WITH PROPOFOL;  Surgeon: Carol Ada, MD;  Location: [facility] ENDOSCOPY;  Service: Endoscopy;  Laterality: N/A;  . EUS N/A 08/09/2014   Procedure: UPPER ENDOSCOPIC ULTRASOUND (EUS) RADIAL;  Surgeon: Carol Ada, MD;  Location: [facility] ENDOSCOPY;  Service: Endoscopy;  Laterality: N/A;  . FINE NEEDLE ASPIRATION N/A 08/09/2014   Procedure: FINE NEEDLE ASPIRATION (FNA) LINEAR;  Surgeon: Carol Ada, MD;  Location: [facility] ENDOSCOPY;  Service: Endoscopy;  Laterality: N/A;  . GASTROSTOMY TUBE PLACEMENT  10/2014  . KNEE ARTHROSCOPY  12/21/2011   Procedure: ARTHROSCOPY KNEE;  Surgeon: Tobi Bastos, MD;  Location: [facility];  Service: Orthopedics;  Laterality: Right;  WITH MEDIAL MENISECTOMY  . LARYNGOSCOPY  01/2015 and 04/21/15  . ORIF LEFT RING FINGER  AND REVASCULARIZATION OF RADIAL SIDE  02-16-2007   DEGLOVING INJURY  . [place] cath    . PORTACATH PLACEMENT  Removed 06/20/3015   as of 05-27-15 remains right chest  . ROTATOR CUFF REPAIR  2008   LEFT SHOULDER  . SHOULDER OPEN ROTATOR CUFF REPAIR Right 06/05/2014   Procedure: RIGHT ROTATOR CUFF REPAIR SHOULDER OPEN;  Surgeon: Latanya Maudlin, MD;  Location: [facility] ORS;  Service: Orthopedics;  Laterality: Right;  . TONSILLECTOMY     as child    REVIEW OF SYSTEMS:  A comprehensive review of systems was negative except for: Constitutional: positive for fatigue Integument/breast: positive for rash Musculoskeletal:  positive for muscle weakness   PHYSICAL EXAMINATION: General appearance: alert, cooperative, cachectic, fatigued and no distress Head: Normocephalic, without obvious abnormality, atraumatic Neck: no adenopathy, no JVD, supple, symmetrical, trachea midline and thyroid not enlarged, symmetric, no tenderness/mass/nodules Lymph nodes: Cervical, supraclavicular, and axillary nodes normal. Resp: clear to auscultation bilaterally Back: symmetric, no curvature. ROM normal. No CVA tenderness. Cardio: regular rate and rhythm, S1, S2 normal, no murmur, click, rub or gallop GI: soft, non-tender; bowel sounds normal; no masses,  no organomegaly Extremities: extremities normal, atraumatic, no cyanosis or edema Neurologic: Alert and oriented X 3, normal strength and tone. Normal symmetric reflexes. Normal coordination and gait  ECOG PERFORMANCE STATUS: 1 - Symptomatic but completely ambulatory  Blood pressure 139/86, pulse 75, temperature 97.6 F (36.4 C), temperature source Oral, resp. rate 18, height _0  (1.676 m), weight 140 lb 4.8 oz (63.6 kg), SpO2 99 %.  LABORATORY DATA: Lab Results  Component Value Date   WBC 5.9 02/12/2016   HGB 12.6 (L) 02/12/2016   HCT 37.6 (L) 02/12/2016   MCV 106.9 (H) 02/12/2016   PLT 115 (L) 02/12/2016      Chemistry      Component Value Date/Time   NA 139 02/12/2016 1115   K 4.6 02/12/2016 1115   CL 104 10/26/2015 1715   CL 109 (H) 08/29/2012 0856   CO2 19 (L) 02/12/2016 1115   BUN 33.7 (H) 02/12/2016 1115   CREATININE 1.1 02/12/2016 1115      Component Value Date/Time   CALCIUM 9.4 02/12/2016 1115   ALKPHOS 456 (H) 02/12/2016 1115   AST 85 (H) 02/12/2016 1115   ALT 119 (H) 02/12/2016 1115   BILITOT 5.12 (HH) 02/12/2016 1115       RADIOGRAPHIC STUDIES: Ct Chest W Contrast  Result Date: 01/20/2016 CLINICAL DATA:  Non-small cell lung cancer. EXAM: CT CHEST, ABDOMEN, AND PELVIS WITH CONTRAST TECHNIQUE: Multidetector CT imaging of the chest, abdomen  and pelvis was performed following the standard protocol during bolus administration of intravenous contrast. CONTRAST:  131m ISOVUE-300 IOPAMIDOL (ISOVUE-300) INJECTION 61% COMPARISON:  08/19/2015 FINDINGS: CT CHEST FINDINGS Cardiovascular: Normal heart size. There is no pericardial effusion. Calcification in the RCA, LAD and left circumflex coronary artery. Mediastinum/Nodes: The trachea appears patent and is midline. Right paratracheal adenopathy is stable measuring 1.6 cm, image 16 of series 2. Normal appearance of the esophagus. Lungs/Pleura: No pleural effusion identified. Moderate changes of centrilobular and paraseptal emphysema. Para mediastinal radiation change is identified within both lungs. Subpleural nodule in the left upper lobe measures 3 mm and is unchanged from previous exam, image 42 of series 6. Musculoskeletal: No aggressive lytic or sclerotic bone lesions identified. CT ABDOMEN PELVIS  FINDINGS Hepatobiliary: No focal liver abnormality is seen. Status post cholecystectomy. No biliary dilatation. There is a stent within the common bile duct. Pneumobilia is identified compatible with biliary patency. Pancreas: Unremarkable. No pancreatic ductal dilatation or surrounding inflammatory changes. Spleen: Normal in size without focal abnormality. Adrenals/Urinary Tract: The adrenal glands appear normal. Normal appearance of the right kidney. Left kidney cyst measures 1.2 cm, image 15 series 7. Urinary bladder appears normal. Stomach/Bowel: Gastrostomy tube is identified. Stomach is within normal limits. Appendix appears normal. No evidence of bowel wall thickening, distention, or inflammatory changes.Gastrostomy tube is identified. Vascular/Lymphatic: Aortic atherosclerosis. The retrocaval/right sub less muscle mass measures 1.3 x 2.4 cm, image 77 of series 2. Previously this measured 3.8 x 5.3 cm. Index left periaortic node measures 6 mm, image 74 of series 2. Previously 1.2 cm. Reproductive: Prostate  is unremarkable. Other: No abdominal wall hernia or abnormality. No abdominopelvic ascites. Musculoskeletal: No aggressive lytic or sclerotic bone lesions degenerative disc disease is present within the lumbar spine. IMPRESSION: 1. Stable appearance of right paratracheal adenopathy. 2. Retroperitoneal metastatic disease has decreased in size in the interval including the retrocaval/psoas mass and paraaortic adenopathy. 3. Stable small pulmonary nodule. 4. Aortic atherosclerosis and coronary artery calcifications. Electronically Signed   By: Kerby Moors M.D.   On: 01/20/2016 13:18   Ct Abdomen Pelvis W Contrast  Result Date: 01/20/2016 CLINICAL DATA:  Non-small cell lung cancer. EXAM: CT CHEST, ABDOMEN, AND PELVIS WITH CONTRAST TECHNIQUE: Multidetector CT imaging of the chest, abdomen and pelvis was performed following the standard protocol during bolus administration of intravenous contrast. CONTRAST:  177m ISOVUE-300 IOPAMIDOL (ISOVUE-300) INJECTION 61% COMPARISON:  08/19/2015 FINDINGS: CT CHEST FINDINGS Cardiovascular: Normal heart size. There is no pericardial effusion. Calcification in the RCA, LAD and left circumflex coronary artery. Mediastinum/Nodes: The trachea appears patent and is midline. Right paratracheal adenopathy is stable measuring 1.6 cm, image 16 of series 2. Normal appearance of the esophagus. Lungs/Pleura: No pleural effusion identified. Moderate changes of centrilobular and paraseptal emphysema. Para mediastinal radiation change is identified within both lungs. Subpleural nodule in the left upper lobe measures 3 mm and is unchanged from previous exam, image 42 of series 6. Musculoskeletal: No aggressive lytic or sclerotic bone lesions identified. CT ABDOMEN PELVIS FINDINGS Hepatobiliary: No focal liver abnormality is seen. Status post cholecystectomy. No biliary dilatation. There is a stent within the common bile duct. Pneumobilia is identified compatible with biliary patency.  Pancreas: Unremarkable. No pancreatic ductal dilatation or surrounding inflammatory changes. Spleen: Normal in size without focal abnormality. Adrenals/Urinary Tract: The adrenal glands appear normal. Normal appearance of the right kidney. Left kidney cyst measures 1.2 cm, image 15 series 7. Urinary bladder appears normal. Stomach/Bowel: Gastrostomy tube is identified. Stomach is within normal limits. Appendix appears normal. No evidence of bowel wall thickening, distention, or inflammatory changes.Gastrostomy tube is identified. Vascular/Lymphatic: Aortic atherosclerosis. The retrocaval/right sub less muscle mass measures 1.3 x 2.4 cm, image 77 of series 2. Previously this measured 3.8 x 5.3 cm. Index left periaortic node measures 6 mm, image 74 of series 2. Previously 1.2 cm. Reproductive: Prostate is unremarkable. Other: No abdominal wall hernia or abnormality. No abdominopelvic ascites. Musculoskeletal: No aggressive lytic or sclerotic bone lesions degenerative disc disease is present within the lumbar spine. IMPRESSION: 1. Stable appearance of right paratracheal adenopathy. 2. Retroperitoneal metastatic disease has decreased in size in the interval including the retrocaval/psoas mass and paraaortic adenopathy. 3. Stable small pulmonary nodule. 4. Aortic atherosclerosis and coronary artery calcifications. Electronically Signed  By: Kerby Moors M.D.   On: 01/20/2016 13:18    ASSESSMENT AND PLAN: This is a very pleasant 70 years old white male with a stage IV non-small cell lung cancer diagnosed in September 2010 status post several chemotherapy regimens including immunotherapy which was discontinued recently secondary to disease progression. Over the last few months he patient has significant improvement in his condition. He requested reevaluation for treatment. The recent CT scan of the chest, abdomen and pelvis showed response to therapy of the right paratracheal adenopathy but there was progression of  the retroperitoneal metastatic disease. The final pathology from the CT-guided core biopsy of the enlarging retroperitoneal lymphadenopathy was consistent with metastatic poorly differentiated carcinoma. Molecular studies were negative for EGFR, ALK and ROS 1. PDL 1 expression is 50%.   He is currently on immunotherapy with Ketruda (pembrolizumab) status post 4 cycles. His serum bilirubin is elevated today. I do think the patient will be made the parameter for treatment today. We will refer him back to Dr. Benson Norway evaluation of the biliary stent before resuming his systemic immune therapy. I will delay cycle #5 to start in 3 weeks. The patient in the right side of the neck is highly suspicious for muscle spasm and we will continue to monitor it for now. The patient would come back for follow-up visit in 3 weeks for evaluation before starting cycle #5. He was advised to call immediately if he has any concerning symptoms. The patient voices understanding of current disease status and treatment options and is in agreement with the current care plan.  All questions were answered. The patient knows to call the clinic with any problems, questions or concerns. We can certainly see the patient much sooner if necessary.  Disclaimer: This note was dictated with voice recognition software. Similar sounding words can inadvertently be transcribed and may not be corrected upon review.

## 2016-02-13 ENCOUNTER — Other Ambulatory Visit: Payer: Self-pay | Admitting: Gastroenterology

## 2016-02-16 ENCOUNTER — Encounter (HOSPITAL_COMMUNITY): Payer: Self-pay | Admitting: *Deleted

## 2016-02-18 ENCOUNTER — Ambulatory Visit (HOSPITAL_COMMUNITY)
Admission: RE | Admit: 2016-02-18 | Discharge: 2016-02-18 | Disposition: A | Discharge status: Home / Self Care | Payer: Medicare Other | Source: Ambulatory Visit | Attending: Gastroenterology | Admitting: Gastroenterology

## 2016-02-18 ENCOUNTER — Encounter (HOSPITAL_COMMUNITY): Payer: Self-pay

## 2016-02-18 ENCOUNTER — Encounter (HOSPITAL_COMMUNITY)
Admission: RE | Disposition: A | Discharge status: Home / Self Care | Payer: Self-pay | Source: Ambulatory Visit | Attending: Gastroenterology

## 2016-02-18 ENCOUNTER — Ambulatory Visit (HOSPITAL_COMMUNITY): Payer: Medicare Other

## 2016-02-18 ENCOUNTER — Ambulatory Visit (HOSPITAL_COMMUNITY): Payer: Medicare Other | Admitting: Registered Nurse

## 2016-02-18 DIAGNOSIS — Z888 Allergy status to other drugs, medicaments and biological substances status: Secondary | ICD-10-CM | POA: Insufficient documentation

## 2016-02-18 DIAGNOSIS — Z87891 Personal history of nicotine dependence: Secondary | ICD-10-CM | POA: Insufficient documentation

## 2016-02-18 DIAGNOSIS — N400 Enlarged prostate without lower urinary tract symptoms: Secondary | ICD-10-CM | POA: Insufficient documentation

## 2016-02-18 DIAGNOSIS — Z7982 Long term (current) use of aspirin: Secondary | ICD-10-CM | POA: Diagnosis not present

## 2016-02-18 DIAGNOSIS — C799 Secondary malignant neoplasm of unspecified site: Secondary | ICD-10-CM | POA: Diagnosis not present

## 2016-02-18 DIAGNOSIS — Z923 Personal history of irradiation: Secondary | ICD-10-CM | POA: Insufficient documentation

## 2016-02-18 DIAGNOSIS — Z885 Allergy status to narcotic agent status: Secondary | ICD-10-CM | POA: Diagnosis not present

## 2016-02-18 DIAGNOSIS — R17 Unspecified jaundice: Secondary | ICD-10-CM

## 2016-02-18 DIAGNOSIS — D649 Anemia, unspecified: Secondary | ICD-10-CM | POA: Insufficient documentation

## 2016-02-18 DIAGNOSIS — K219 Gastro-esophageal reflux disease without esophagitis: Secondary | ICD-10-CM | POA: Diagnosis not present

## 2016-02-18 DIAGNOSIS — Z85118 Personal history of other malignant neoplasm of bronchus and lung: Secondary | ICD-10-CM | POA: Diagnosis not present

## 2016-02-18 DIAGNOSIS — Z931 Gastrostomy status: Secondary | ICD-10-CM | POA: Insufficient documentation

## 2016-02-18 DIAGNOSIS — I10 Essential (primary) hypertension: Secondary | ICD-10-CM | POA: Insufficient documentation

## 2016-02-18 DIAGNOSIS — Z800 Family history of malignant neoplasm of digestive organs: Secondary | ICD-10-CM | POA: Insufficient documentation

## 2016-02-18 DIAGNOSIS — Z4659 Encounter for fitting and adjustment of other gastrointestinal appliance and device: Secondary | ICD-10-CM | POA: Diagnosis not present

## 2016-02-18 DIAGNOSIS — Z880 Allergy status to penicillin: Secondary | ICD-10-CM | POA: Insufficient documentation

## 2016-02-18 DIAGNOSIS — K831 Obstruction of bile duct: Secondary | ICD-10-CM | POA: Insufficient documentation

## 2016-02-18 DIAGNOSIS — Z881 Allergy status to other antibiotic agents status: Secondary | ICD-10-CM | POA: Insufficient documentation

## 2016-02-18 DIAGNOSIS — M1990 Unspecified osteoarthritis, unspecified site: Secondary | ICD-10-CM | POA: Diagnosis not present

## 2016-02-18 HISTORY — PX: ERCP: SHX5425

## 2016-02-18 HISTORY — DX: Personal history of other medical treatment: Z92.89

## 2016-02-18 SURGERY — ERCP, WITH INTERVENTION IF INDICATED
Anesthesia: General

## 2016-02-18 MED ORDER — ONDANSETRON HCL 4 MG/2ML IJ SOLN
INTRAMUSCULAR | Status: AC
  Filled 2016-02-18: qty 2

## 2016-02-18 MED ORDER — DEXAMETHASONE SODIUM PHOSPHATE 10 MG/ML IJ SOLN
INTRAMUSCULAR | Status: AC
  Filled 2016-02-18: qty 1

## 2016-02-18 MED ORDER — SODIUM CHLORIDE 0.9 % IV SOLN: INTRAVENOUS | Status: DC

## 2016-02-18 MED ORDER — GLUCAGON HCL RDNA (DIAGNOSTIC) 1 MG IJ SOLR
INTRAMUSCULAR | Status: AC
  Filled 2016-02-18: qty 1

## 2016-02-18 MED ORDER — PHENYLEPHRINE 40 MCG/ML (10ML) SYRINGE FOR IV PUSH (FOR BLOOD PRESSURE SUPPORT)
PREFILLED_SYRINGE | INTRAVENOUS | Status: AC
  Filled 2016-02-18: qty 20

## 2016-02-18 MED ORDER — CIPROFLOXACIN IN D5W 400 MG/200ML IV SOLN
INTRAVENOUS | Status: AC
  Filled 2016-02-18: qty 200

## 2016-02-18 MED ORDER — SODIUM CHLORIDE 0.9 % IV SOLN
INTRAVENOUS | Status: DC | PRN
  Administered 2016-02-18: 30 mL

## 2016-02-18 MED ORDER — LIDOCAINE 2% (20 MG/ML) 5 ML SYRINGE
INTRAMUSCULAR | Status: AC
  Filled 2016-02-18: qty 5

## 2016-02-18 MED ORDER — SUCCINYLCHOLINE CHLORIDE 200 MG/10ML IV SOSY
PREFILLED_SYRINGE | INTRAVENOUS | Status: DC | PRN
  Administered 2016-02-18: 100 mg via INTRAVENOUS

## 2016-02-18 MED ORDER — PHENYLEPHRINE 40 MCG/ML (10ML) SYRINGE FOR IV PUSH (FOR BLOOD PRESSURE SUPPORT)
PREFILLED_SYRINGE | INTRAVENOUS | Status: DC | PRN
  Administered 2016-02-18 (×5): 80 ug via INTRAVENOUS
  Administered 2016-02-18: 120 ug via INTRAVENOUS

## 2016-02-18 MED ORDER — LIDOCAINE 2% (20 MG/ML) 5 ML SYRINGE
INTRAMUSCULAR | Status: DC | PRN
  Administered 2016-02-18: 50 mg via INTRAVENOUS

## 2016-02-18 MED ORDER — LACTATED RINGERS IV SOLN
INTRAVENOUS | Status: DC | PRN
  Administered 2016-02-18 (×3): via INTRAVENOUS

## 2016-02-18 MED ORDER — PHENYLEPHRINE 40 MCG/ML (10ML) SYRINGE FOR IV PUSH (FOR BLOOD PRESSURE SUPPORT)
PREFILLED_SYRINGE | INTRAVENOUS | Status: AC
  Filled 2016-02-18: qty 10

## 2016-02-18 MED ORDER — EPHEDRINE 5 MG/ML INJ
INTRAVENOUS | Status: AC
  Filled 2016-02-18: qty 10

## 2016-02-18 MED ORDER — CIPROFLOXACIN IN D5W 400 MG/200ML IV SOLN
INTRAVENOUS | Status: DC | PRN
  Administered 2016-02-18: 400 mg via INTRAVENOUS

## 2016-02-18 MED ORDER — PROPOFOL 10 MG/ML IV BOLUS
INTRAVENOUS | Status: DC | PRN
  Administered 2016-02-18: 140 mg via INTRAVENOUS

## 2016-02-18 MED ORDER — INDOMETHACIN 50 MG RE SUPP
RECTAL | Status: AC
  Filled 2016-02-18: qty 2

## 2016-02-18 MED ORDER — PROPOFOL 10 MG/ML IV BOLUS
INTRAVENOUS | Status: AC
  Filled 2016-02-18: qty 20

## 2016-02-18 MED ORDER — FENTANYL CITRATE (PF) 100 MCG/2ML IJ SOLN
INTRAMUSCULAR | Status: AC
  Filled 2016-02-18: qty 2

## 2016-02-18 MED ORDER — EPHEDRINE SULFATE-NACL 50-0.9 MG/10ML-% IV SOSY
PREFILLED_SYRINGE | INTRAVENOUS | Status: DC | PRN
  Administered 2016-02-18: 10 mg via INTRAVENOUS
  Administered 2016-02-18: 5 mg via INTRAVENOUS

## 2016-02-18 MED ORDER — ONDANSETRON HCL 4 MG/2ML IJ SOLN
INTRAMUSCULAR | Status: DC | PRN
  Administered 2016-02-18: 4 mg via INTRAVENOUS

## 2016-02-18 MED ORDER — SUCCINYLCHOLINE CHLORIDE 200 MG/10ML IV SOSY
PREFILLED_SYRINGE | INTRAVENOUS | Status: AC
  Filled 2016-02-18: qty 10

## 2016-02-18 MED ORDER — INDOMETHACIN 50 MG RE SUPP
RECTAL | Status: DC | PRN
  Administered 2016-02-18: 100 mg via RECTAL

## 2016-02-18 NOTE — Transfer of Care (Signed)
Immediate Anesthesia Transfer of Care Note  Patient: [name]  Procedure(s) Performed: Procedure(s): ENDOSCOPIC RETROGRADE CHOLANGIOPANCREATOGRAPHY (ERCP) (N/A)  Patient Location: PACU  Anesthesia Type:General  Level of Consciousness: awake, alert  and oriented  Airway & Oxygen Therapy: Patient Spontanous Breathing and Patient connected to face mask oxygen  Post-op Assessment: Report given to RN and Post -op Vital signs reviewed and stable  Post vital signs: Reviewed and stable  Last Vitals:  Vitals:   02/18/16 1051  BP: 116/71  Pulse: 81  Resp: 11  Temp: 36.9 C    Last Pain:  Vitals:   02/18/16 1051  TempSrc: Oral         Complications: No apparent anesthesia complications

## 2016-02-18 NOTE — Anesthesia Procedure Notes (Signed)
Procedure Name: Intubation Performed by: Gean Maidens Pre-anesthesia Checklist: Patient identified, Emergency Drugs available, Suction available, Patient being monitored and Timeout performed Patient Re-evaluated:Patient Re-evaluated prior to inductionOxygen Delivery Method: Circle system utilized Preoxygenation: Pre-oxygenation with 100% oxygen Intubation Type: IV induction Ventilation: Mask ventilation without difficulty Laryngoscope Size: Mac and 4 Grade View: Grade I Tube type: Oral Tube size: 7.5 mm Number of attempts: 1 Airway Equipment and Method: Stylet Placement Confirmation: ETT inserted through vocal cords under direct vision,  positive ETCO2,  CO2 detector and breath sounds checked- equal and bilateral Secured at: 22 cm Tube secured with: Tape Dental Injury: Teeth and Oropharynx as per pre-operative assessment

## 2016-02-18 NOTE — Discharge Instructions (Signed)
Endoscopic Retrograde Cholangiopancreatography (ERCP), Care After Refer to this sheet in the next few weeks. These instructions provide you with information on caring for yourself after your procedure. Your health care provider may also give you more specific instructions. Your treatment has been planned according to current medical practices, but problems sometimes occur. Call your health care provider if you have any problems or questions after your procedure.  WHAT TO EXPECT AFTER THE PROCEDURE  After your procedure, it is typical to feel:   Soreness in your throat.   Sick to your stomach (nauseous).   Bloated.  Dizzy.   Fatigued. HOME CARE INSTRUCTIONS  Have a friend or family member stay with you for the first 24 hours after your procedure.  Start taking your usual medicines and eating normally as soon as you feel well enough to do so or as directed by your health care provider. SEEK MEDICAL CARE IF:  You have abdominal pain.   You develop signs of infection, such as:   Chills.   Feeling unwell.  SEEK IMMEDIATE MEDICAL CARE IF:  You have difficulty swallowing.  You have worsening throat, chest, or abdominal pain.  You vomit.  You have bloody or very black stools.  You have a fever. This information is not intended to replace advice given to you by your health care provider. Make sure you discuss any questions you have with your health care provider. Document Released: 01/03/2013 Document Reviewed: 01/03/2013 Elsevier Interactive Patient Education  2017 [employer].

## 2016-02-18 NOTE — Op Note (Signed)
[facility] Patient Name: [name] Procedure Date: 02/18/2016 MRN: [id number] Attending MD: Carol Ada , MD Date of Birth: Aug 04, 1945 CSN: [id number] Age: 70 Admit Type: Outpatient Procedure:                ERCP Indications:              Stent change Providers:                Carol Ada, MD, Cleda Daub, RN, Ralene Bathe,                            Technician, Herbie Drape, CRNA Referring MD:              Medicines:                General Anesthesia Complications:            No immediate complications. Estimated Blood Loss:     Estimated blood loss was minimal. Procedure:                Pre-Anesthesia Assessment:                           - Prior to the procedure, a History and Physical                            was performed, and patient medications and                            allergies were reviewed. The patient's tolerance of                            previous anesthesia was also reviewed. The risks                            and benefits of the procedure and the sedation                            options and risks were discussed with the patient.                            All questions were answered, and informed consent                            was obtained. Prior Anticoagulants: The patient has                            taken no previous anticoagulant or antiplatelet                            agents. ASA Grade Assessment: III - A patient with                            severe systemic disease. After reviewing the risks  and benefits, the patient was deemed in                            satisfactory condition to undergo the procedure.                           - Sedation was administered by an anesthesia                            professional. General anesthesia was attained.                           After obtaining informed consent, the scope was                            passed under direct vision.  Throughout the                            procedure, the patient's blood pressure, pulse, and                            oxygen saturations were monitored continuously. The                            was introduced through the mouth, and used to                            inject contrast into and used to inject contrast                            into the bile duct. The EG-[id number] ([id number]) scope                            was introduced through the and used to inject                            contrast into. The ERCP was extremely difficult.                            The patient tolerated the procedure well. Scope In: Scope Out: Findings:      The biliary tree contained one covered metal stent. This was found to be       out of position. The stent was removed using a rat-toothed forceps. A 7       Fr by 7 cm plastic stent with a single external flap and a single       internal flap was placed 6 cm into the biliary tree. Bile flowed through       the stent. The stent was in good position.      This procedure was EXTREMELY DIFFICULT. Luminal distortion prevented       adequate positioning in the short position. The long position was       required. The metallic stent was out of position and with manipulation       using a an upper EGD scope the  stent was gently pullout out of the       distal CBD and deposited in the gastric lumen. The ERCP scope was       reinserted and it was not able to be advanced into the second portion of       the duodenum. With partial visualization the of the ampullary region the       CBD was successfully cannulated. Contrast injection again revealed the       porximal CBD stricture measuring 2 cm. It appears to be longer this       time. An initial attempt with plaing an 8 Fr x 7 cm stent failed as it       was not able to cross the stricture. A 7 Fr x 7 cm stent was used, but       failed to cross the stricture. Balloon dilation in 3 different portions       of  the stricure was performed with each dilation lasting 1 minute.       Insertion of the 7 Fr x 7 cm stent was successful with moderate       difficulty. Excellent positioning was noted by fluoroscopy, but I was       not able to obtain an image of the external portion of the stent. An EGD       scope was then used to extract the stent from the gastric lumen with a       rat toothed forcep. The end of the stent was grasped and gently pulled       out of the patient. No resistance was encountered. No evidence of post       procedural crepitus was palpated. The internal bumper of the PEG tube       was intact. Impression:               - One stent was exchanged in the biliary tree. Moderate Sedation:      N/A- Per Anesthesia Care Recommendation:           - Repeat ERCP in 3 months to exchange stent.                           - No further placements of any metallic stents. Procedure Code(s):        --- Professional ---                           [phone], Endoscopic retrograde                            cholangiopancreatography (ERCP); with removal and                            exchange of stent(s), biliary or pancreatic duct,                            including pre- and post-dilation and guide wire                            passage, when performed, including sphincterotomy,  when performed, each stent exchanged Diagnosis Code(s):        --- Professional ---                           Z46.59, Encounter for fitting and adjustment of                            other gastrointestinal appliance and device CPT copyright 2016 [employer]. All rights reserved. The codes documented in this report are preliminary and upon coder review may  be revised to meet current compliance requirements. Carol Ada, MD Carol Ada, MD 02/18/2016 1:53:58 PM This report has been signed electronically. Number of Addenda: 0

## 2016-02-18 NOTE — Anesthesia Postprocedure Evaluation (Signed)
Anesthesia Post Note  Patient: Torrey Ballinas  Procedure(s) Performed: Procedure(s) (LRB): ENDOSCOPIC RETROGRADE CHOLANGIOPANCREATOGRAPHY (ERCP) (N/A)  Patient location during evaluation: Endoscopy Anesthesia Type: General Level of consciousness: awake, oriented and patient cooperative Pain management: pain level controlled Vital Signs Assessment: post-procedure vital signs reviewed and stable Respiratory status: spontaneous breathing, nonlabored ventilation, respiratory function stable and patient connected to nasal cannula oxygen Cardiovascular status: blood pressure returned to baseline and stable Postop Assessment: no signs of nausea or vomiting Anesthetic complications: no    Last Vitals:  Vitals:   02/18/16 1440 02/18/16 1444  BP: (!) 150/75   Pulse: 80 79  Resp: 17 (!) 22  Temp:      Last Pain:  Vitals:   02/18/16 1402  TempSrc: Oral                 Nghia Mcentee,JAMES TERRILL

## 2016-02-18 NOTE — H&P (Signed)
[name] HPI: There is a recent increase in his bilirubin.  ERCP performed on 05/30/2015 was significant for a proximal CBD stricture.  A 10 mm x 60 mm covered metallic stent was placed and his obstructive pattern with the liver enzymes improved.  It never normalized, but recently, without any overt explanation his bilirubin was noted to be increasing by Dr. [name].  In the past, prior scans did not reveal the stricture and he has responded to treatment for his lung cancer.    Past Medical History:  Diagnosis Date  . Abnormality of gait 03/12/2015  . Acute meniscal tear of knee RIGHT KNEE  . ARF (acute renal failure) ([place]) 03/2015   pre-renal patient was septic. is improved now.  . Arthritis RIGHT SHOULDER  . BPH (benign prostatic hypertrophy)   . Dizziness and giddiness 04/23/2014  . Gastrostomy tube dependent ([place])    Continous feedings per Gordy Councilman tube"unable to swallow"- Infusion pump for Nutrition  . HOH (hard of hearing)    bilateral hearing aids  . Hypertension    no longer an issue. Now runs usually low- tekes med to increase blood pressure  . Immature cataract BILATERAL  . Non-small cell lung cancer ([place]) DX SEPT 2010  W/ CHEMORADIATION AT THAT TIME -- NOW  W/ METS--  CURRENTLY ON MAINTENANCE CHEMO TX  EVERY 83 DAYS   ONCOLOGIST- DR [place] [facility]- lung cancer with metastasis.  . Orthostatic hypotension    B/P tends to run 110/70 or less -using med to raise B/P.  Marland Kitchen Pneumonia   . Radiation 01/08/09-01/21/09   Mediastinum 30 Gy x 12 fractions  . Radiation 08/19/14-09/04/14   Palliative RT Porta hepatic LN 30 Gy  . Sepsis ([place]) 03/2015  . Shortness of breath dyspnea    with exertion  . Swallowing impairment    "Unable to swallow"  . Transfusion history    last 1-2 yrs ago with chemotherapy  . Tremor 04/23/2014   Right hand    Past Surgical History:  Procedure Laterality Date  . BILATERAL EAR DRUM SURGERY  1960'S  . BILE DUCT STENT PLACEMENT    . CATARACT EXTRACTION  Bilateral   . ERCP N/A 08/09/2014   Procedure: ENDOSCOPIC RETROGRADE CHOLANGIOPANCREATOGRAPHY (ERCP);  Surgeon: Carol Ada, MD;  Location: [facility];  Service: Endoscopy;  Laterality: N/A;  . ERCP N/A 05/23/2015   Procedure: ENDOSCOPIC RETROGRADE CHOLANGIOPANCREATOGRAPHY (ERCP);  Surgeon: Carol Ada, MD;  Location: [facility] ENDOSCOPY;  Service: Endoscopy;  Laterality: N/A;  . ERCP N/A 05/30/2015   Procedure: ENDOSCOPIC RETROGRADE CHOLANGIOPANCREATOGRAPHY (ERCP);  Surgeon: Carol Ada, MD;  Location: [facility];  Service: Endoscopy;  Laterality: N/A;  . ESOPHAGOGASTRODUODENOSCOPY (EGD) WITH PROPOFOL N/A 08/09/2014   Procedure: ESOPHAGOGASTRODUODENOSCOPY (EGD) WITH PROPOFOL;  Surgeon: Carol Ada, MD;  Location: [facility] ENDOSCOPY;  Service: Endoscopy;  Laterality: N/A;  . EUS N/A 08/09/2014   Procedure: UPPER ENDOSCOPIC ULTRASOUND (EUS) RADIAL;  Surgeon: Carol Ada, MD;  Location: [facility] ENDOSCOPY;  Service: Endoscopy;  Laterality: N/A;  . FINE NEEDLE ASPIRATION N/A 08/09/2014   Procedure: FINE NEEDLE ASPIRATION (FNA) LINEAR;  Surgeon: Carol Ada, MD;  Location: [facility] ENDOSCOPY;  Service: Endoscopy;  Laterality: N/A;  . GASTROSTOMY TUBE PLACEMENT  10/2014  . KNEE ARTHROSCOPY  12/21/2011   Procedure: ARTHROSCOPY KNEE;  Surgeon: Tobi Bastos, MD;  Location: [facility];  Service: Orthopedics;  Laterality: Right;  WITH MEDIAL MENISECTOMY  . LARYNGOSCOPY  01/2015 and 04/21/15  . ORIF LEFT RING FINGER  AND REVASCULARIZATION OF RADIAL SIDE  02-16-2007  DEGLOVING INJURY  . S[place] John cath    . PORTACATH PLACEMENT  Removed 06/20/3015   as of 05-27-15 remains right chest. 02-16-16 Port has been removed now.  Marland Kitchen ROTATOR CUFF REPAIR  2008   LEFT SHOULDER  . SHOULDER OPEN ROTATOR CUFF REPAIR Right 06/05/2014   Procedure: RIGHT ROTATOR CUFF REPAIR SHOULDER OPEN;  Surgeon: Latanya Maudlin, MD;  Location: [facility];  Service: Orthopedics;  Laterality: Right;  . TONSILLECTOMY     as child    Family History   Problem Relation Age of Onset  . Congestive Heart Failure Mother   . Cancer Sister   . Cancer Maternal Grandfather   . Colon cancer Father   . Heart attack Brother   . Colon cancer    . Heart attack    . Cancer    . Hypertension    . Hyperlipidemia      Social History:  reports that he quit smoking about 34 years ago. His smoking use included Cigarettes. He has a 40.00 pack-year smoking history. He has never used smokeless tobacco. He reports that he drinks about 4.8 oz of alcohol per week . He reports that he does not use drugs.  Allergies:  Allergies  Allergen Reactions  . Fentanyl Other (See Comments)    Reaction:  Hallucinations   . Augmentin [Amoxicillin-Pot Clavulanate] Diarrhea and Other (See Comments)    .Has patient had a PCN reaction causing immediate rash, facial/tongue/throat swelling, SOB or lightheadedness with hypotension: No Has patient had a PCN reaction causing severe rash involving mucus membranes or skin necrosis: No Has patient had a PCN reaction that required hospitalization No Has patient had a PCN reaction occurring within the last 10 years: Yes If all of the above answers are "NO", then may proceed with Cephalosporin use.   [place] [Levofloxacin] Other (See Comments)    Reaction:  Knee pain   . Percocet [Oxycodone-Acetaminophen] Nausea And Vomiting  . Scopolamine Other (See Comments)    Reaction:  Hallucinations   . Zolpidem Tartrate Diarrhea  . Carboplatin Itching and Rash    Medications:  Scheduled:  Continuous: . sodium chloride      No results found. However, due to the size of the patient record, not all encounters were searched. Please check Results Review for a complete set of results.   No results found.  ROS:  As stated above in the HPI otherwise negative.  Blood pressure 116/71, pulse 81, temperature 98.5 F (36.9 C), temperature source Oral, resp. rate 11, SpO2 99 %.    PE: Gen: NAD, Alert and Oriented HEENT:  [place]/AT,  EOMI Neck: Supple, no LAD Lungs: CTA Bilaterally CV: RRR without M/G/R ABM: Soft, NTND, +BS, PEG tube in the left side. Ext: No C/C/E  Assessment/Plan: 1) Biliary stricture. 2) Increasing bilirubin.   I will interrogate the CBD with an ERCP as prior imaging did not pick up the stricture.  If his stent is out of place or clogged, I will replace the stent.  If the stent is patent and in position, no further GI intervention will be pursued, however, these metallic stents are not designed for long term use.  It may be a real possibility that he will need a stent change in the future with continued response for his lung cancer treatment.  Greene Diodato D 02/18/2016, 11:59 AM

## 2016-02-18 NOTE — Anesthesia Preprocedure Evaluation (Signed)
Anesthesia Evaluation  Patient identified by MRN, date of birth, ID band Patient awake    Reviewed: Allergy & Precautions, NPO status , Patient's Chart, lab work & pertinent test results  History of Anesthesia Complications Negative for: history of anesthetic complications  Airway Mallampati: I  TM Distance: <3 FB Neck ROM: Full    Dental  (+) Dental Advisory Given, Partial Upper, Missing   Pulmonary shortness of breath and with exertion, pneumonia, resolved, former smoker,  Non-small cell lung cancer s/p radiation, chemotherapy   Pulmonary exam normal breath sounds clear to auscultation       Cardiovascular Exercise Tolerance: Poor hypertension, (-) angina(-) CAD and (-) Past MI Normal cardiovascular exam Rhythm:Regular Rate:Normal  Hypotension-on midodrine   Neuro/Psych Vertigo negative psych ROS   GI/Hepatic GERD  Medicated,Jaundice, elevated bilirubin s/p biliary stent 10/2014 Dysphagia s/p G-tube 10/2014   Endo/Other  negative endocrine ROS  Renal/GU negative Renal ROS     Musculoskeletal  (+) Arthritis , Osteoarthritis,    Abdominal   Peds  Hematology  (+) Blood dyscrasia, anemia ,   Anesthesia Other Findings Day of surgery medications reviewed with the patient.  Sepsis in January 2017  Reproductive/Obstetrics                             Anesthesia Physical  Anesthesia Plan  ASA: III  Anesthesia Plan: General   Post-op Pain Management:    Induction: Intravenous  Airway Management Planned: Oral ETT  Additional Equipment:   Intra-op Plan:   Post-operative Plan: Extubation in OR  Informed Consent: I have reviewed the patients History and Physical, chart, labs and discussed the procedure including the risks, benefits and alternatives for the proposed anesthesia with the patient or authorized representative who has indicated his/her understanding and acceptance.   Dental  advisory given  Plan Discussed with: CRNA  Anesthesia Plan Comments: (Risks/benefits of general anesthesia discussed with patient including risk of damage to teeth, lips, gum, and tongue, nausea/vomiting, allergic reactions to medications, and the possibility of heart attack, stroke and death.  All patient questions answered.  Patient wishes to proceed.)        Anesthesia Quick Evaluation

## 2016-02-23 ENCOUNTER — Encounter (HOSPITAL_COMMUNITY): Payer: Self-pay | Admitting: Gastroenterology

## 2016-02-25 ENCOUNTER — Other Ambulatory Visit: Payer: Self-pay | Admitting: Interventional Radiology

## 2016-02-25 ENCOUNTER — Ambulatory Visit
Admission: RE | Admit: 2016-02-25 | Discharge: 2016-02-25 | Disposition: A | Discharge status: Home / Self Care | Payer: Medicare Other | Source: Ambulatory Visit | Attending: Interventional Radiology | Admitting: Interventional Radiology

## 2016-02-25 DIAGNOSIS — K9421 Gastrostomy hemorrhage: Secondary | ICD-10-CM

## 2016-02-25 HISTORY — PX: IR GENERIC HISTORICAL: IMG1180011

## 2016-02-25 NOTE — Progress Notes (Signed)
Chief Complaint: Bleeding at g-tube site for 3 weeks.  History of Present Illness: [name] is a 70 y.o. male presenting to vascular interventional radiology clinic today as a late scheduled appointment to evaluate 3 weeks of bleeding at percutaneous gastrostomy site.  The gastrostomy tube was placed August 2016 given his lack of appetite and dysphagia. The dysphagia is attributable to radiation therapy, as he has a history of metastatic non-small cell cancer, treated with prior radiotherapy as well as chemotherapy.  At this time he tells me he is unable to take enough calories by mouth intake alone, and he uses the gastrostomy tube every day.  The bleeding at the gastrostomy site started approximately 3 weeks ago, and he usually is able to control this with changing gauze dressings. This has been painless bleeding. The gastrostomy tube continues to operate normally.  Although he would like to have the tube removed permanently, he admits that he is likely unable to get enough by mouth intake to maintain his nutrition, using the tube daily.  I have reviewed the imaging, and it seems there may be a retained foreign body related to the percutaneous placement in the ostomy adjacent to the G-tube/tract.    On physical exam, there is some granulation tissue that is friable adjacent to the tube, which may also be contributing.  He tells me that he has done some research about gastrostomy tubes, and would prefer a low profile tube.  Past Medical History:  Diagnosis Date  . Abnormality of gait 03/12/2015  . Acute meniscal tear of knee RIGHT KNEE  . ARF (acute renal failure) ([place]) 03/2015   pre-renal patient was septic. is improved now.  . Arthritis RIGHT SHOULDER  . BPH (benign prostatic hypertrophy)   . Dizziness and giddiness 04/23/2014  . Gastrostomy tube dependent ([place])    Continous feedings per Gordy Councilman tube"unable to swallow"- Infusion pump for Nutrition  . HOH (hard of  hearing)    bilateral hearing aids  . Hypertension    no longer an issue. Now runs usually low- tekes med to increase blood pressure  . Immature cataract BILATERAL  . Non-small cell lung cancer ([place]) DX SEPT 2010  W/ CHEMORADIATION AT THAT TIME -- NOW  W/ METS--  CURRENTLY ON MAINTENANCE CHEMO TX  EVERY 41 DAYS   ONCOLOGIST- [facility]- lung cancer with metastasis.  . Orthostatic hypotension    B/P tends to run 110/70 or less -using med to raise B/P.  Marland Kitchen Pneumonia   . Radiation 01/08/09-01/21/09   Mediastinum 30 Gy x 12 fractions  . Radiation 08/19/14-09/04/14   Palliative RT Porta hepatic LN 30 Gy  . Sepsis ([place]) 03/2015  . Shortness of breath dyspnea    with exertion  . Swallowing impairment    "Unable to swallow"  . Transfusion history    last 1-2 yrs ago with chemotherapy  . Tremor 04/23/2014   Right hand    Past Surgical History:  Procedure Laterality Date  . BILATERAL EAR DRUM SURGERY  1960'S  . BILE DUCT STENT PLACEMENT    . CATARACT EXTRACTION Bilateral   . ERCP N/A 08/09/2014   Procedure: ENDOSCOPIC RETROGRADE CHOLANGIOPANCREATOGRAPHY (ERCP);  Surgeon: Carol Ada, MD;  Location: [facility];  Service: Endoscopy;  Laterality: N/A;  . ERCP N/A 05/23/2015   Procedure: ENDOSCOPIC RETROGRADE CHOLANGIOPANCREATOGRAPHY (ERCP);  Surgeon: Carol Ada, MD;  Location: [facility] ENDOSCOPY;  Service: Endoscopy;  Laterality: N/A;  . ERCP N/A 05/30/2015   Procedure: ENDOSCOPIC RETROGRADE CHOLANGIOPANCREATOGRAPHY (ERCP);  Surgeon:  Carol Ada, MD;  Location: [facility];  Service: Endoscopy;  Laterality: N/A;  . ERCP N/A 02/18/2016   Procedure: ENDOSCOPIC RETROGRADE CHOLANGIOPANCREATOGRAPHY (ERCP);  Surgeon: Carol Ada, MD;  Location: [facility];  Service: Endoscopy;  Laterality: N/A;  . ESOPHAGOGASTRODUODENOSCOPY (EGD) WITH PROPOFOL N/A 08/09/2014   Procedure: ESOPHAGOGASTRODUODENOSCOPY (EGD) WITH PROPOFOL;  Surgeon: Carol Ada, MD;  Location: [facility] ENDOSCOPY;  Service: Endoscopy;  Laterality:  N/A;  . EUS N/A 08/09/2014   Procedure: UPPER ENDOSCOPIC ULTRASOUND (EUS) RADIAL;  Surgeon: Carol Ada, MD;  Location: [facility] ENDOSCOPY;  Service: Endoscopy;  Laterality: N/A;  . FINE NEEDLE ASPIRATION N/A 08/09/2014   Procedure: FINE NEEDLE ASPIRATION (FNA) LINEAR;  Surgeon: Carol Ada, MD;  Location: [facility] ENDOSCOPY;  Service: Endoscopy;  Laterality: N/A;  . GASTROSTOMY TUBE PLACEMENT  10/2014  . KNEE ARTHROSCOPY  12/21/2011   Procedure: ARTHROSCOPY KNEE;  Surgeon: Tobi Bastos, MD;  Location: [facility];  Service: Orthopedics;  Laterality: Right;  WITH MEDIAL MENISECTOMY  . LARYNGOSCOPY  01/2015 and 04/21/15  . ORIF LEFT RING FINGER  AND REVASCULARIZATION OF RADIAL SIDE  02-16-2007   DEGLOVING INJURY  . Kennan cath    . PORTACATH PLACEMENT  Removed 06/20/3015   as of 05-27-15 remains right chest. 02-16-16 Port has been removed now.  Marland Kitchen ROTATOR CUFF REPAIR  2008   LEFT SHOULDER  . SHOULDER OPEN ROTATOR CUFF REPAIR Right 06/05/2014   Procedure: RIGHT ROTATOR CUFF REPAIR SHOULDER OPEN;  Surgeon: Latanya Maudlin, MD;  Location: [facility]S;  Service: Orthopedics;  Laterality: Right;  . TONSILLECTOMY     as child    Allergies: Fentanyl; Augmentin [amoxicillin-pot clavulanate]; Levaquin [levofloxacin]; Percocet [oxycodone-acetaminophen]; Scopolamine; Zolpidem tartrate; and Carboplatin  Medications: Prior to Admission medications   Medication Sig Start Date End Date Taking? Authorizing Provider  aspirin 325 MG tablet Take 650 mg by mouth daily as needed for mild pain.    Historical Provider, MD  b complex vitamins tablet Take 1 tablet by mouth daily.    Historical Provider, MD  dicyclomine (BENTYL) 20 MG tablet Take 20 mg by mouth every 6 (six) hours as needed (for abdominal cramps).    Historical Provider, MD  diphenhydramine-acetaminophen (TYLENOL PM) 25-500 MG TABS tablet Take 1 tablet by mouth at bedtime as needed (for sleep).     Historical Provider, MD  HYDROmorphone (DILAUDID) 2 MG  tablet Take 2-4 mg by mouth every 4 (four) hours as needed for severe pain.    Historical Provider, MD  hydrOXYzine (ATARAX/VISTARIL) 25 MG tablet Take 25 mg by mouth 3 (three) times daily as needed for itching.    Historical Provider, MD  LORazepam (ATIVAN) 1 MG tablet Take 0.5-1 mg by mouth every 4 (four) hours as needed for anxiety (and/or nausea).    Historical Provider, MD  midodrine (PROAMATINE) 5 MG tablet Take 5 mg by mouth 3 (three) times daily with meals.    Historical Provider, MD  Multiple Vitamin (MULTIVITAMIN WITH MINERALS) TABS tablet Take 1 tablet by mouth daily.    Historical Provider, MD  mupirocin ointment (BACTROBAN) 2 % Apply 1 application topically 3 (three) times daily. Pt applies around tube area.    Historical Provider, MD  naproxen sodium (ANAPROX) 220 MG tablet Take 220-440 mg by mouth 2 (two) times daily as needed (for pain).     Historical Provider, MD  Nutritional Supplements (FEEDING SUPPLEMENT, OSMOLITE 1.5 CAL,) LIQD Place 237 mLs into feeding tube 3 (three) times daily.     Historical Provider, MD  Nutritional Supplements (TWOCAL HN) LIQD Place 237 mLs into feeding tube 3 (three) times daily.    Historical Provider, MD  omeprazole (PRILOSEC) 40 MG capsule Take 40 mg by mouth at bedtime.     Historical Provider, MD  prednisoLONE acetate (PRED FORTE) 1 % ophthalmic suspension Place 1 drop into the right eye 4 (four) times daily. 02/11/16   Historical Provider, MD  promethazine (PHENERGAN) 25 MG tablet Take 25 mg by mouth every 6 (six) hours as needed for nausea or vomiting.    Historical Provider, MD  senna (SENOKOT) 8.6 MG tablet Take 1 tablet by mouth daily as needed for constipation.     Historical Provider, MD  vitamin C (ASCORBIC ACID) 500 MG tablet Take 500 mg by mouth daily.    Historical Provider, MD     Family History  Problem Relation Age of Onset  . Congestive Heart Failure Mother   . Cancer Sister   . Cancer Maternal Grandfather   . Colon cancer Father    . Heart attack Brother   . Colon cancer    . Heart attack    . Cancer    . Hypertension    . Hyperlipidemia      Social History   Social History  . Marital status: Married    Spouse name: N/A  . Number of children: 3  . Years of education: college   Occupational History  . 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 Smoking status: Former Smoker    Packs/day: 2.00    Years: 20.00    Types: Cigarettes    Quit date: 03/17/1981  . Smokeless tobacco: Never Used  . Alcohol use 4.8 oz/week    1 Cans of beer, 7 Standard drinks or equivalent per week     Comment: beer occ. monthly  . Drug use: No  . Sexual activity: Not Currently   Other Topics Concern  . Not on file   Social History Narrative   Lives with his wife, [name]   Patient is right handed.   Patient drinks 1-2 cups caffeine daily.    ECOG Status: 1 - Symptomatic but completely ambulatory  Review of Systems: A 12 point ROS discussed and pertinent positives are indicated in the HPI above.  All other systems are negative.  Review of Systems  Vital Signs: BP 117/74 (BP Location: Left Arm, Patient Position: Sitting, Cuff Size: Normal)   Pulse 86   Resp 15   Ht '5\' 8"'$  (1.727 m)   Wt 140 lb (63.5 kg)   SpO2 98%   BMI 21.29 kg/m   Physical Exam  Mallampati Score:     Imaging: Dg Ercp  Result Date: 02/18/2016 CLINICAL DATA:  70 year old male with prior CBD stent EXAM: ERCP TECHNIQUE: Multiple spot images obtained with the fluoroscopic device and submitted for interpretation post-procedure. FLUOROSCOPY TIME:  3 minutes 7 seconds COMPARISON:  CT 01/20/2016 FINDINGS: Limited fluoroscopic images during ERCP. Initial image demonstrates endoscope within the upper abdomen with cannulation of the ampulla. Partial opacification of the extrahepatic biliary system. Final image demonstrates stent exchange, with placement of a plastic stent into the common bile duct. IMPRESSION:  Limited images during ERCP demonstrate evidence of CBD stent exchange with placement of a plastic biliary stent. Please refer to the dictated operative report for full details of intraoperative findings and procedure. Signed, Dulcy Fanny. [name], DO [facility] Radiology Electronically Signed   By:  Corrie Mckusick D.O.   On: 02/18/2016 14:00    Labs:  CBC:  Recent Labs  12/11/15 1002 01/01/16 1022 01/22/16 1010 02/12/16 1115  WBC 5.1 7.2 7.5 5.9  HGB 12.8* 13.0 13.1 12.6*  HCT 37.7* 37.5* 37.8* 37.6*  PLT 93* 102* 124* 115*    COAGS:  Recent Labs  06/20/15 0735 09/08/15 0901  INR 1.10 1.22  APTT 30 25    BMP:  Recent Labs  05/23/15 0813 06/20/15 0735  10/21/15 0922 10/26/15 1715  12/11/15 1002 01/01/16 1022 01/22/16 1010 02/12/16 1115  NA 138 139  < > 136 135  < > 139 139 138 139  K 4.5 4.7  < > 4.1 4.3  < > 4.5 4.9 4.8 4.6  CL 102 102  --  107 104  --   --   --   --   --   CO2 23 26  < > 21* 21*  < > 22 21* 21* 19*  GLUCOSE 109* 117*  < > 138* 108*  < > 122 105 103 120  BUN 28* 31*  < > 33* 34*  < > 32.8* 37.6* 34.3* 33.7*  CALCIUM 10.2 10.5*  < > 9.0 9.4  < > 9.4 9.6 9.5 9.4  CREATININE 0.96 1.13  < > 1.06 1.19  < > 1.3 1.4* 1.2 1.1  GFRNONAA >60 >60  --  >60 >60  --   --   --   --   --   GFRAA >60 >60  --  >60 >60  --   --   --   --   --   < > = values in this interval not displayed.  LIVER FUNCTION TESTS:  Recent Labs  12/11/15 1002 01/01/16 1022 01/22/16 1010 02/12/16 1115  BILITOT 2.48* 2.25* 3.04* 5.12*  AST 52* 69* 84* 85*  ALT 90* 95* 121* 119*  ALKPHOS 328* 360* 450* 456*  PROT 6.7 6.9 7.0 6.5  ALBUMIN 3.3* 3.4* 3.3* 3.1*    TUMOR MARKERS: No results for input(s): AFPTM, CEA, CA199, CHROMGRNA in the last 8760 hours.  Assessment and Plan:  Mr Kluver is a 70 year old gentleman with new onset bleeding at the site of gastrostomy tube within the last 3 weeks.  This tube has been placed over  a year ago for swallowing difficulties, which are ongoing, and it seems he needs to keep the tube. He would prefer low-profile tube, which she has researched on the Internet.  Regarding the etiology of bleeding, this may simply be related to the granulation tissue at the ostomy site, though also could be related to what appears to be a retained foreign body associated with the prior T tack placement, which is present on the most recent CT scan adjacent to the tube.  I discussed with him fluoroscopic guided foreign body retrieval attempt as well as percutaneous gastrostomy tube replacement with moderate sedation, while at the same time addressing the granulation tissue. Risks would include bleeding, infection, loss of access to the stomach lumen, need for further procedure/surgery, local injury, cardiopulmonary collapse, death.  He would like to pursue the gastrostomy tube replacement and attempt at foreign body retrieval with moderate sedation. He is most familiar with [facility]: Schedule for g-tube replacement with low-profile Mic-Key tube, with moderate sedation.  Attempt at foreign body retrieval of the retained T-tack.  Possible debridement/cautery of the granulation tissue at the site.    Electronically Signed: Corrie Mckusick 02/25/2016, 4:25 PM  I spent a total of    25 Minutes in face to face in clinical consultation, greater than 50% of which was counseling/coordinating care for bleeding at gastrostomy tube site, malfunctioning G-tube with possible replacement, possible foreign body retrieval at the ostomy site.

## 2016-03-01 ENCOUNTER — Other Ambulatory Visit: Payer: Self-pay | Admitting: Interventional Radiology

## 2016-03-01 ENCOUNTER — Encounter: Payer: Self-pay | Admitting: *Deleted

## 2016-03-01 DIAGNOSIS — K9421 Gastrostomy hemorrhage: Secondary | ICD-10-CM

## 2016-03-01 NOTE — Progress Notes (Signed)
[place] with Tiffany Kocher about Lorazepam request asked that [name] speak with Dr. Allegra Lai about the refill since he has not been seen in the Radiation oncology clinic lately for a follow up appointment. [place] with [name] about his Lorazepam refill request I let him know that [name], PA-C is requesting that he get his refill from Dr. [name] since he has not been seen in the Radiation oncology clinic for follow up lately.  [name] said he was not sure he would even get the prescription filled but he would check with Dr. [name].

## 2016-03-02 ENCOUNTER — Encounter (HOSPITAL_COMMUNITY): Payer: Self-pay | Admitting: Interventional Radiology

## 2016-03-02 ENCOUNTER — Ambulatory Visit (HOSPITAL_COMMUNITY)
Admission: RE | Admit: 2016-03-02 | Discharge: 2016-03-02 | Disposition: A | Discharge status: Home / Self Care | Payer: Medicare Other | Source: Ambulatory Visit | Attending: Interventional Radiology | Admitting: Interventional Radiology

## 2016-03-02 ENCOUNTER — Other Ambulatory Visit: Payer: Self-pay | Admitting: Interventional Radiology

## 2016-03-02 DIAGNOSIS — K9421 Gastrostomy hemorrhage: Secondary | ICD-10-CM | POA: Diagnosis not present

## 2016-03-02 HISTORY — PX: IR GENERIC HISTORICAL: IMG1180011

## 2016-03-02 MED ORDER — SILVER NITRATE-POT NITRATE 75-25 % EX MISC
CUTANEOUS | Status: AC
  Filled 2016-03-02: qty 2

## 2016-03-02 MED ORDER — LIDOCAINE HCL 1 % IJ SOLN
INTRAMUSCULAR | Status: DC | PRN
  Administered 2016-03-02: 10 mL

## 2016-03-02 MED ORDER — LIDOCAINE HCL 1 % IJ SOLN
INTRAMUSCULAR | Status: AC
  Filled 2016-03-02: qty 20

## 2016-03-02 MED ORDER — SILVER NITRATE-POT NITRATE 75-25 % EX MISC
CUTANEOUS | Status: DC | PRN
  Administered 2016-03-02: 5 via TOPICAL

## 2016-03-02 MED ORDER — IOPAMIDOL (ISOVUE-300) INJECTION 61%
INTRAVENOUS | Status: AC
  Filled 2016-03-02: qty 50

## 2016-03-02 NOTE — Procedures (Signed)
Interventional Radiology Procedure Note  Patient is SP perc gastrostomy, approximately 1 yr.  Recent imaging shows retained foreign body at the site, with retained T-tack.  He has significant granulation tissue with ongoing episodic bleeding.    Presents today for possible fluoro guided FB retrieval, and treatment of the granulation tissue.   Procedure: Fluoro scan of the abdomen shows that there is no evidence of persisting FB.  Seems that the t-tack has passed.   Local anesthesia with 1% lidocaine, and treatment of the granulation tissue with silver nitrate treatment.  .  Complications: None  Recommendations:  - OK to use g-tube - Routine dressing changes at ostomy site.  - After healing, may consider a topical kenalog cream to decrease granulation tissue, either 1% concentration, or 0.25% concentration.  - OK for DC.   Signed,  Dulcy Fanny. [name], DO

## 2016-03-04 ENCOUNTER — Telehealth: Payer: Self-pay | Admitting: Internal Medicine

## 2016-03-04 ENCOUNTER — Other Ambulatory Visit (HOSPITAL_BASED_OUTPATIENT_CLINIC_OR_DEPARTMENT_OTHER): Payer: Medicare Other

## 2016-03-04 ENCOUNTER — Ambulatory Visit (HOSPITAL_BASED_OUTPATIENT_CLINIC_OR_DEPARTMENT_OTHER): Payer: Medicare Other | Admitting: Internal Medicine

## 2016-03-04 ENCOUNTER — Other Ambulatory Visit: Payer: Self-pay | Admitting: Medical Oncology

## 2016-03-04 ENCOUNTER — Telehealth: Payer: Self-pay | Admitting: Medical Oncology

## 2016-03-04 ENCOUNTER — Ambulatory Visit: Payer: Medicare Other

## 2016-03-04 ENCOUNTER — Encounter: Payer: Self-pay | Admitting: Internal Medicine

## 2016-03-04 VITALS — BP 125/79 | HR 79 | Temp 97.9°F | Resp 18 | Ht 68.0 in | Wt 135.9 lb

## 2016-03-04 DIAGNOSIS — R634 Abnormal weight loss: Secondary | ICD-10-CM

## 2016-03-04 DIAGNOSIS — R17 Unspecified jaundice: Secondary | ICD-10-CM

## 2016-03-04 DIAGNOSIS — Z79899 Other long term (current) drug therapy: Secondary | ICD-10-CM | POA: Diagnosis not present

## 2016-03-04 DIAGNOSIS — C3490 Malignant neoplasm of unspecified part of unspecified bronchus or lung: Secondary | ICD-10-CM

## 2016-03-04 DIAGNOSIS — C772 Secondary and unspecified malignant neoplasm of intra-abdominal lymph nodes: Secondary | ICD-10-CM

## 2016-03-04 DIAGNOSIS — C3492 Malignant neoplasm of unspecified part of left bronchus or lung: Secondary | ICD-10-CM

## 2016-03-04 DIAGNOSIS — D6481 Anemia due to antineoplastic chemotherapy: Secondary | ICD-10-CM

## 2016-03-04 DIAGNOSIS — K831 Obstruction of bile duct: Secondary | ICD-10-CM

## 2016-03-04 DIAGNOSIS — T451X5A Adverse effect of antineoplastic and immunosuppressive drugs, initial encounter: Secondary | ICD-10-CM

## 2016-03-04 DIAGNOSIS — E43 Unspecified severe protein-calorie malnutrition: Secondary | ICD-10-CM

## 2016-03-04 DIAGNOSIS — R5382 Chronic fatigue, unspecified: Secondary | ICD-10-CM

## 2016-03-04 DIAGNOSIS — Z5112 Encounter for antineoplastic immunotherapy: Secondary | ICD-10-CM

## 2016-03-04 HISTORY — DX: Adverse effect of antineoplastic and immunosuppressive drugs, initial encounter: D64.81

## 2016-03-04 LAB — CBC WITH DIFFERENTIAL/PLATELET
BASO%: 0.4 % (ref 0.0–2.0)
Basophils Absolute: 0 10*3/uL (ref 0.0–0.1)
EOS%: 3.5 % (ref 0.0–7.0)
Eosinophils Absolute: 0.3 10*3/uL (ref 0.0–0.5)
HCT: 35.2 % — ABNORMAL LOW (ref 38.4–49.9)
HGB: 11.8 g/dL — ABNORMAL LOW (ref 13.0–17.1)
LYMPH%: 12.4 % — AB (ref 14.0–49.0)
MCH: 35.2 pg — ABNORMAL HIGH (ref 27.2–33.4)
MCHC: 33.5 g/dL (ref 32.0–36.0)
MCV: 105.1 fL — ABNORMAL HIGH (ref 79.3–98.0)
MONO#: 1 10*3/uL — ABNORMAL HIGH (ref 0.1–0.9)
MONO%: 12.5 % (ref 0.0–14.0)
NEUT%: 71.2 % (ref 39.0–75.0)
NEUTROS ABS: 5.9 10*3/uL (ref 1.5–6.5)
Platelets: 130 10*3/uL — ABNORMAL LOW (ref 140–400)
RBC: 3.35 10*6/uL — AB (ref 4.20–5.82)
RDW: 14.5 % (ref 11.0–14.6)
WBC: 8.2 10*3/uL (ref 4.0–10.3)
lymph#: 1 10*3/uL (ref 0.9–3.3)

## 2016-03-04 LAB — COMPREHENSIVE METABOLIC PANEL
ALT: 136 U/L — AB (ref 0–55)
AST: 123 U/L — ABNORMAL HIGH (ref 5–34)
Albumin: 3 g/dL — ABNORMAL LOW (ref 3.5–5.0)
Alkaline Phosphatase: 408 U/L — ABNORMAL HIGH (ref 40–150)
Anion Gap: 11 mEq/L (ref 3–11)
BUN: 31.8 mg/dL — ABNORMAL HIGH (ref 7.0–26.0)
CHLORIDE: 106 meq/L (ref 98–109)
CO2: 22 meq/L (ref 22–29)
Calcium: 9.3 mg/dL (ref 8.4–10.4)
Creatinine: 1.2 mg/dL (ref 0.7–1.3)
EGFR: 62 mL/min/{1.73_m2} — AB (ref >90)
GLUCOSE: 117 mg/dL (ref 70–140)
Potassium: 4.3 mEq/L (ref 3.5–5.1)
SODIUM: 139 meq/L (ref 136–145)
TOTAL PROTEIN: 6.8 g/dL (ref 6.4–8.3)

## 2016-03-04 LAB — TSH: TSH: 2.759 m[IU]/L (ref 0.320–4.118)

## 2016-03-04 NOTE — Progress Notes (Signed)
[facility] [phone]   Fax:([phone]) [place], [place] [address] [place] [address]  DIAGNOSIS: Metastatic non-small cell lung cancer, adenocarcinoma diagnosed in September 2010. PDL 1 expression 50%.  PRIOR THERAPY: #1 status post 6 cycles of systemic chemotherapy with carboplatin, Alimta and Avastin given every 3 weeks last dose given 04/23/2009 with disease stabilization.  #2 status post palliative radiotherapy to the mediastinum under the care of Dr. Pablo Ledger. The patient received a total dose of 3000 cGY and 12 fractions completed 01/21/2009.  #3 Maintenance chemotherapy with Alimta at 500 mg per meter squared and Avastin at 15 mg per kilogram given every 3 weeks status post 35 cycles.  #4 Maintenance chemotherapy with Alimta 500 mg/M2 and Avastin 15 mg/kg every 4 weeks,status post 17 cycles, discontinued secondary to disease progression.  #5 Systemic chemotherapy with carboplatin for AUC of 5, Alimta 500 mg/M2 and Avastin 15 mg/kg every 3 weeks, status post 3 cycles, last cycle was given 12/28/2012. Carboplatin was discontinued starting cycle #2 secondary to hypersensitivity reaction. #6 Maintenance chemotherapy again with Alimta 500 mg/M2 and Avastin 15 mg/kg every 3 weeks, first cycle 01/23/2013. Status post 6 cycles. #7 palliative radiotherapy to the large abdominal mass under the care of Dr. Pablo Ledger. #8  Immunotherapy with Nivolumab 3 MG/KG every 2 weeks. First dose 10/04/2014. Status post 3 cycles discontinued secondary to disease progression. #9. Status post palliative radiotherapy to the progressive retroperitoneal lymph nodes under the care of Dr. Sondra Come.  CURRENT THERAPY: Ketruda (pembrolizumab) 200 mg IV every 3 weeks, status post 4 cycles. First dose was given 11/20/2015.  INTERVAL HISTORY: [name] 70 y.o. male returns to the clinic today for follow-up visit accompanied by his  wife. The patient lost around 5 pounds since his last visit. During his last visit he was found to have significant elevation of his serum bilirubin. I referred him to Dr. Benson Norway for evaluation. He underwent ERCP under the care of Dr. Benson Norway on 02/18/2016. It showed the biliary tree contained 1 covered medically stent that was found to be out of position. This is stent was removed and replaced. He is scheduled for repeat ERCP in 3 months. He is feeling a little bit better today. He continues to complain of fatigue and weakness. He denied having any significant chest pain, shortness of breath, cough or hemoptysis. She has no nausea, vomiting, diarrhea or constipation. He has some new skin tags in different areas of his body including the left neck. He is here today for evaluation before resuming his treatment with [place] (pembrolizumab).  MEDICAL HISTORY: Past Medical History:  Diagnosis Date  . Abnormality of gait 03/12/2015  . Acute meniscal tear of knee RIGHT KNEE  . ARF (acute renal failure) ([place]) 03/2015   pre-renal patient was septic. is improved now.  . Arthritis RIGHT SHOULDER  . BPH (benign prostatic hypertrophy)   . Dizziness and giddiness 04/23/2014  . Gastrostomy tube dependent ([place])    Continous feedings per Gordy Councilman tube"unable to swallow"- Infusion pump for Nutrition  . HOH (hard of hearing)    bilateral hearing aids  . Hypertension    no longer an issue. Now runs usually low- tekes med to increase blood pressure  . Immature cataract BILATERAL  . Non-small cell lung cancer ([place]) DX SEPT 2010  W/ CHEMORADIATION AT THAT TIME -- NOW  W/ METS--  CURRENTLY ON MAINTENANCE CHEMO TX  EVERY 30 DAYS  ONCOLOGIST- DR [name]- lung cancer with metastasis.  . Orthostatic hypotension    B/P tends to run 110/70 or less -using med to raise B/P.  Marland Kitchen Pneumonia   . Radiation 01/08/09-01/21/09   Mediastinum 30 Gy x 12 fractions  . Radiation 08/19/14-09/04/14   Palliative RT Porta hepatic LN 30 Gy  .  Sepsis ([place]) 03/2015  . Shortness of breath dyspnea    with exertion  . Swallowing impairment    "Unable to swallow"  . Transfusion history    last 1-2 yrs ago with chemotherapy  . Tremor 04/23/2014   Right hand    ALLERGIES:  is allergic to fentanyl; augmentin [amoxicillin-pot clavulanate]; levaquin [levofloxacin]; percocet [oxycodone-acetaminophen]; scopolamine; zolpidem tartrate; and carboplatin.  MEDICATIONS:  Current Outpatient Prescriptions  Medication Sig Dispense Refill  . aspirin 325 MG tablet Take 650 mg by mouth daily as needed for mild pain.    Marland Kitchen b complex vitamins tablet Take 1 tablet by mouth daily.    Marland Kitchen dicyclomine (BENTYL) 20 MG tablet Take 20 mg by mouth every 6 (six) hours as needed (for abdominal cramps).    . diphenhydramine-acetaminophen (TYLENOL PM) 25-500 MG TABS tablet Take 1 tablet by mouth at bedtime as needed (for sleep).     Marland Kitchen HYDROmorphone (DILAUDID) 2 MG tablet Take 2-4 mg by mouth every 4 (four) hours as needed for severe pain.    . hydrOXYzine (ATARAX/VISTARIL) 25 MG tablet Take 25 mg by mouth 3 (three) times daily as needed for itching.    Marland Kitchen LORazepam (ATIVAN) 1 MG tablet Take 0.5-1 mg by mouth every 4 (four) hours as needed for anxiety (and/or nausea).    . midodrine (PROAMATINE) 5 MG tablet Take 5 mg by mouth 3 (three) times daily with meals.    . Multiple Vitamin (MULTIVITAMIN WITH MINERALS) TABS tablet Take 1 tablet by mouth daily.    . mupirocin ointment (BACTROBAN) 2 % Apply 1 application topically 3 (three) times daily. Pt applies around tube area.  0  . naproxen sodium (ANAPROX) 220 MG tablet Take 220-440 mg by mouth 2 (two) times daily as needed (for pain).     . Nutritional Supplements (FEEDING SUPPLEMENT, OSMOLITE 1.5 CAL,) LIQD Place 237 mLs into feeding tube 3 (three) times daily.     . Nutritional Supplements (TWOCAL HN) LIQD Place 237 mLs into feeding tube 3 (three) times daily.    Marland Kitchen omeprazole (PRILOSEC) 40 MG capsule Take 40 mg by mouth at  bedtime.     . prednisoLONE acetate (PRED FORTE) 1 % ophthalmic suspension Place 1 drop into the right eye 4 (four) times daily.    . promethazine (PHENERGAN) 25 MG tablet Take 25 mg by mouth every 6 (six) hours as needed for nausea or vomiting.    . senna (SENOKOT) 8.6 MG tablet Take 1 tablet by mouth daily as needed for constipation.     . vitamin C (ASCORBIC ACID) 500 MG tablet Take 500 mg by mouth daily.     No current facility-administered medications for this visit.     SURGICAL HISTORY:  Past Surgical History:  Procedure Laterality Date  . BILATERAL EAR DRUM SURGERY  1960'S  . BILE DUCT STENT PLACEMENT    . CATARACT EXTRACTION Bilateral   . ERCP N/A 08/09/2014   Procedure: ENDOSCOPIC RETROGRADE CHOLANGIOPANCREATOGRAPHY (ERCP);  Surgeon: Carol Ada, MD;  Location: [facility];  Service: Endoscopy;  Laterality: N/A;  . ERCP N/A 05/23/2015   Procedure: ENDOSCOPIC RETROGRADE CHOLANGIOPANCREATOGRAPHY (ERCP);  Surgeon: Carol Ada, MD;  Location: [facility];  Service: Endoscopy;  Laterality: N/A;  . ERCP N/A 05/30/2015   Procedure: ENDOSCOPIC RETROGRADE CHOLANGIOPANCREATOGRAPHY (ERCP);  Surgeon: Carol Ada, MD;  Location: [facility];  Service: Endoscopy;  Laterality: N/A;  . ERCP N/A 02/18/2016   Procedure: ENDOSCOPIC RETROGRADE CHOLANGIOPANCREATOGRAPHY (ERCP);  Surgeon: Carol Ada, MD;  Location: [facility];  Service: Endoscopy;  Laterality: N/A;  . ESOPHAGOGASTRODUODENOSCOPY (EGD) WITH PROPOFOL N/A 08/09/2014   Procedure: ESOPHAGOGASTRODUODENOSCOPY (EGD) WITH PROPOFOL;  Surgeon: Carol Ada, MD;  Location: [facility];  Service: Endoscopy;  Laterality: N/A;  . EUS N/A 08/09/2014   Procedure: UPPER ENDOSCOPIC ULTRASOUND (EUS) RADIAL;  Surgeon: Carol Ada, MD;  Location: [facility];  Service: Endoscopy;  Laterality: N/A;  . FINE NEEDLE ASPIRATION N/A 08/09/2014   Procedure: FINE NEEDLE ASPIRATION (FNA) LINEAR;  Surgeon: Carol Ada, MD;  Location: [facility];  Service:  Endoscopy;  Laterality: N/A;  . GASTROSTOMY TUBE PLACEMENT  10/2014  . IR GENERIC HISTORICAL  03/02/2016   IR FLUORO RM 30-60 MIN 03/02/2016 Corrie Mckusick, DO [facility]  . KNEE ARTHROSCOPY  12/21/2011   Procedure: ARTHROSCOPY KNEE;  Surgeon: Tobi Bastos, MD;  Location: [facility];  Service: Orthopedics;  Laterality: Right;  WITH MEDIAL MENISECTOMY  . LARYNGOSCOPY  01/2015 and 04/21/15  . ORIF LEFT RING FINGER  AND REVASCULARIZATION OF RADIAL SIDE  02-16-2007   DEGLOVING INJURY  . [place] cath    . PORTACATH PLACEMENT  Removed 06/20/3015   as of 05-27-15 remains right chest. 02-16-16 Port has been removed now.  Marland Kitchen ROTATOR CUFF REPAIR  2008   LEFT SHOULDER  . SHOULDER OPEN ROTATOR CUFF REPAIR Right 06/05/2014   Procedure: RIGHT ROTATOR CUFF REPAIR SHOULDER OPEN;  Surgeon: Latanya Maudlin, MD;  Location: [facility];  Service: Orthopedics;  Laterality: Right;  . TONSILLECTOMY     as child    REVIEW OF SYSTEMS:  Constitutional: positive for anorexia, fatigue and weight loss Eyes: negative Ears, nose, mouth, throat, and face: negative Respiratory: negative Cardiovascular: negative Gastrointestinal: positive for nausea Genitourinary:negative for dysuria, frequency and hematuria Integument/breast: positive for rash Hematologic/lymphatic: negative for bleeding, easy bruising and lymphadenopathy Musculoskeletal:positive for muscle weakness Neurological: negative Behavioral/Psych: negative Endocrine: negative Allergic/Immunologic: negative   PHYSICAL EXAMINATION: General appearance: alert, cooperative, fatigued and no distress Head: Normocephalic, without obvious abnormality, atraumatic Neck: no adenopathy, no JVD, supple, symmetrical, trachea midline and thyroid not enlarged, symmetric, no tenderness/mass/nodules Lymph nodes: Cervical, supraclavicular, and axillary nodes normal. Resp: clear to auscultation bilaterally Back: symmetric, no curvature. ROM normal. No CVA  tenderness. Cardio: regular rate and rhythm, S1, S2 normal, no murmur, click, rub or gallop GI: soft, non-tender; bowel sounds normal; no masses,  no organomegaly Extremities: extremities normal, atraumatic, no cyanosis or edema Neurologic: Alert and oriented X 3, normal strength and tone. Normal symmetric reflexes. Normal coordination and gait  ECOG PERFORMANCE STATUS: 1 - Symptomatic but completely ambulatory  Blood pressure 125/79, pulse 79, temperature 97.9 F (36.6 C), temperature source Oral, resp. rate 18, height '5\' 8"'$  (1.727 m), weight 135 lb 14.4 oz (61.6 kg), SpO2 100 %.  LABORATORY DATA: Lab Results  Component Value Date   WBC 8.2 03/04/2016   HGB 11.8 (L) 03/04/2016   HCT 35.2 (L) 03/04/2016   MCV 105.1 (H) 03/04/2016   PLT 130 (L) 03/04/2016      Chemistry      Component Value Date/Time   NA 139 02/12/2016 1115   K 4.6 02/12/2016 1115   CL 104 10/26/2015 1715  CL 109 (H) 08/29/2012 0856   CO2 19 (L) 02/12/2016 1115   BUN 33.7 (H) 02/12/2016 1115   CREATININE 1.1 02/12/2016 1115      Component Value Date/Time   CALCIUM 9.4 02/12/2016 1115   ALKPHOS 456 (H) 02/12/2016 1115   AST 85 (H) 02/12/2016 1115   ALT 119 (H) 02/12/2016 1115   BILITOT 5.12 (HH) 02/12/2016 1115       RADIOGRAPHIC STUDIES: Ir Fluoro Rm 30-60 Min  Result Date: 03/02/2016 INDICATION: 70 year old male with a history of percutaneous gastrostomy tube placement with a retained T tack on CT dated 01/20/2016. EXAM: IR FLOURO RM 0-60 MIN COMPARISON:  CT 01/20/2016 MEDICATIONS: None ANESTHESIA/SEDATION: None FLUOROSCOPY TIME:  Fluoroscopy Time: 0 minutes 42 seconds (6 mGy). COMPLICATIONS: None TECHNIQUE: Informed written consent was obtained from the patient after a thorough discussion of the procedural risks, benefits and alternatives. All questions were addressed. Maximal Sterile Barrier Technique was utilized including caps, mask, sterile gowns, sterile gloves, sterile drape, hand hygiene and  skin antiseptic. A timeout was performed prior to the initiation of the procedure. Patient is position in the supine position on the fluoroscopy table. The region surrounding G-tube was prepped and draped in the usual sterile fashion. The skin and subcutaneous tissues at the ostomy site were generously infiltrated 1% lidocaine for local anesthesia. Fluoroscopic survey was then performed at the site of the ostomy, searching for the retained foreign body (retained T tack from prior gastrostomy). This was not present at the ostomy site, and so the rest of the abdomen was surveyed. No evidence of the T tack on the fluoroscopic survey. At this time, the granulation tissue at the ostomy site was treated with silver nitrate. Dressing was placed. Patient tolerated the procedure well and remained hemodynamically stable throughout. No complications were encountered and no significant blood loss encountered. FINDINGS: Fluoroscopic survey demonstrates no evidence of retained T tack, which seems to have passed through the GI system. Silver nitrate treatment of granulation tissue. IMPRESSION: Status post fluoroscopic survey of the abdomen demonstrating no evidence of retained foreign body on the current study, which seems to have passed through the GI tract. Local treatment of granulation tissue at the ostomy site with silver nitrate. Should this recur, topical treatment with repeat silver nitrate or alternatively a prescription for Kenalog cream, either 0.25% or 1% may be considered. Signed, Dulcy Fanny. [name], DO [facility] Radiology Electronically Signed   By: Corrie Mckusick D.O.   On: 03/02/2016 16:56   Dg Ercp  Result Date: 02/18/2016 CLINICAL DATA:  70 year old male with prior CBD stent EXAM: ERCP TECHNIQUE: Multiple spot images obtained with the fluoroscopic device and submitted for interpretation post-procedure. FLUOROSCOPY TIME:  3 minutes 7 seconds COMPARISON:  CT  01/20/2016 FINDINGS: Limited fluoroscopic images during ERCP. Initial image demonstrates endoscope within the upper abdomen with cannulation of the ampulla. Partial opacification of the extrahepatic biliary system. Final image demonstrates stent exchange, with placement of a plastic stent into the common bile duct. IMPRESSION: Limited images during ERCP demonstrate evidence of CBD stent exchange with placement of a plastic biliary stent. Please refer to the dictated operative report for full details of intraoperative findings and procedure. Signed, Dulcy Fanny. [name], DO [facility] Radiology Electronically Signed   By: Corrie Mckusick D.O.   On: 02/18/2016 14:00    ASSESSMENT AND PLAN: This is a very pleasant 70 years old white male with: 1) metastatic non-small cell lung cancer, adenocarcinoma  with a very complex treated course and several treatment in the past. He is currently undergoing treatment with immunotherapy with Ketruda (pembrolizumab) status post 4 cycles. His treatment has been on hold secondary to hyperbilirubinemia. He recently underwent biliary stent replacement. His comprehensive metabolic panel are still pending today. I recommended for the patient to proceed with cycle #5 of there is no concerning findings on the lab work today. He would come back for follow-up visit in 3 weeks for evaluation before the next cycle of his treatment. 2) hyperbilirubinemia and biliary ductal obstruction: He recently underwent ERCP with stent replacement. He is expected to have another ERCP in 3 months. 3) chemotherapy-induced anemia: He will continue on oral iron tablets for now. I will continue to monitor his hemoglobin and hematocrit closely and consider the patient for transfusion as needed. 4) weight loss: I encouraged the patient to increase his supplemental nutrition via the PEG tube. He was advised to call immediately if he has any concerning symptoms in  the interval. The patient voices understanding of current disease status and treatment options and is in agreement with the current care plan.  All questions were answered. The patient knows to call the clinic with any problems, questions or concerns. We can certainly see the patient much sooner if necessary. I spent 15 minutes counseling the patient face to face. The total time spent in the appointment was 25 minutes. Disclaimer: This note was dictated with voice recognition software. Similar sounding words can inadvertently be transcribed and may not be corrected upon review.

## 2016-03-04 NOTE — Telephone Encounter (Signed)
Patient seen in office today by MM and presented to scheduling with a 12/7 los from [facility] as well as a 12/7 schedule message sent by desk nurse. Desk nurse alerted scheduling re both messages.  Per los lab/fu/tx q3w and per schedule message delay today's tx til next week.   Spoke with infusion supervisor and scheduled lab/tx for 12/13. The next lab/fu/tx is scheduled for 3 weeks later on 1/4. Lab/fu/tx for 12/28 cxd due to tx delay from today to 12/13. Patient/wife aware.   Gave patient avs report and appointments for December and January.

## 2016-03-04 NOTE — Telephone Encounter (Signed)
Wife asking for IR to evaluate stent -order placed per [facility]. Wife notified.

## 2016-03-05 ENCOUNTER — Other Ambulatory Visit: Payer: Self-pay | Admitting: *Deleted

## 2016-03-05 MED ORDER — LORAZEPAM 1 MG PO TABS: 0.5000 mg | ORAL_TABLET | ORAL | 0 refills | Status: DC | PRN

## 2016-03-05 NOTE — Telephone Encounter (Signed)
rx faxed to pharmacy

## 2016-03-08 ENCOUNTER — Other Ambulatory Visit: Payer: Self-pay | Admitting: Internal Medicine

## 2016-03-08 ENCOUNTER — Other Ambulatory Visit: Payer: Self-pay | Admitting: Medical Oncology

## 2016-03-08 DIAGNOSIS — R17 Unspecified jaundice: Secondary | ICD-10-CM

## 2016-03-08 DIAGNOSIS — K831 Obstruction of bile duct: Principal | ICD-10-CM

## 2016-03-08 DIAGNOSIS — C801 Malignant (primary) neoplasm, unspecified: Secondary | ICD-10-CM

## 2016-03-08 DIAGNOSIS — C3400 Malignant neoplasm of unspecified main bronchus: Secondary | ICD-10-CM

## 2016-03-09 ENCOUNTER — Other Ambulatory Visit: Payer: Self-pay | Admitting: Medical Oncology

## 2016-03-09 ENCOUNTER — Ambulatory Visit
Admission: RE | Admit: 2016-03-09 | Discharge: 2016-03-09 | Disposition: A | Discharge status: Home / Self Care | Payer: Medicare Other | Source: Ambulatory Visit | Attending: Internal Medicine | Admitting: Internal Medicine

## 2016-03-09 ENCOUNTER — Telehealth: Payer: Self-pay

## 2016-03-09 DIAGNOSIS — C3490 Malignant neoplasm of unspecified part of unspecified bronchus or lung: Secondary | ICD-10-CM

## 2016-03-09 DIAGNOSIS — C3492 Malignant neoplasm of unspecified part of left bronchus or lung: Secondary | ICD-10-CM

## 2016-03-09 DIAGNOSIS — C3400 Malignant neoplasm of unspecified main bronchus: Secondary | ICD-10-CM

## 2016-03-09 DIAGNOSIS — R17 Unspecified jaundice: Secondary | ICD-10-CM

## 2016-03-09 HISTORY — PX: IR GENERIC HISTORICAL: IMG1180011

## 2016-03-09 MED ORDER — HYDROMORPHONE HCL 2 MG PO TABS: 2.0000 mg | ORAL_TABLET | ORAL | 0 refills | Status: DC | PRN

## 2016-03-09 NOTE — Consult Note (Signed)
Chief Complaint: I have increasing bilirubin.   Referring Physician(s): Mohamed,Mohamed  History of Present Illness: [name] is a 70 y.o. male presenting to vascular and interventional radiology today as a scheduled appointment, kindly referred by Dr. [name], for evaluation of potential percutaneous transhepatic cholangiogram with drain placement.  [name] has a history of metastatic non-small cell lung cancer diagnosed September 2010. He has had treatment with chemotherapy and radiation, including radiotherapy to the abdomen for metastatic lymphadenopathy.  He first experienced a biliary obstruction May 2016, at which point the obstruction was secondary to metastatic lymphadenopathy. At this time he underwent PTCA and drainage by my partner Dr. Kathlene Cote, 08/12/2014. Internal/external biliary drainage was achieved. 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 He has had both plastic stent and metallic stent within the common bile duct. Prior endoscopy have been performed for stent purposes 05/23/2015, 05/30/2015, and most recently 02/18/2016. The most recent ERCP was successful with placing a plastic stent and retrieving a metallic stent.  Since the most recent stent placement, he has had increasing symptoms of jaundice, pruritus, and scleral icterus, and a comprehensive metabolic panel has demonstrated increasing total bilirubin. He also complains of dark urine at this time.  He denies fevers rigors, or chills.  His GI physician, Dr. Benson Norway, and his Oncologist, Dr. [name], agree that he likely has further obstruction, and that PTC may be indicated.  His most recent CMP 03/04/2016 demonstrates total bilirubin of 8.4. No leukocytosis on the recent study. Platelets 1:30. He takes aspirin, though no Plavix. He is not taking anticoagulation at this  time.  He has a pending CT study to confirm bile duct dilation.  Past Medical History:  Diagnosis Date  . Abnormality of gait 03/12/2015  . Acute meniscal tear of knee RIGHT KNEE  . Antineoplastic chemotherapy induced anemia 03/04/2016  . ARF (acute renal failure) ([place]) 03/2015   pre-renal patient was septic. is improved now.  . Arthritis RIGHT SHOULDER  . BPH (benign prostatic hypertrophy)   . Dizziness and giddiness 04/23/2014  . Gastrostomy tube dependent ([place])    Continous feedings per [name] tube"unable to swallow"- Infusion pump for Nutrition  . HOH (hard of hearing)    bilateral hearing aids  . Hypertension    no longer an issue. Now runs usually low- tekes med to increase blood pressure  . Immature cataract BILATERAL  . Non-small cell lung cancer ([place]) DX SEPT 2010  W/ CHEMORADIATION AT THAT TIME -- NOW  W/ METS--  CURRENTLY ON MAINTENANCE CHEMO TX  EVERY 65 DAYS   ONCOLOGIST- DR [facility]- lung cancer with metastasis.  . Orthostatic hypotension    B/P tends to run 110/70 or less -using med to raise B/P.  Marland Kitchen Pneumonia   . Radiation 01/08/09-01/21/09   Mediastinum 30 Gy x 12 fractions  . Radiation 08/19/14-09/04/14   Palliative RT Porta hepatic LN 30 Gy  . Sepsis ([place]) 03/2015  . Shortness of breath dyspnea    with exertion  . Swallowing impairment    "Unable to swallow"  . Transfusion history    last 1-2 yrs ago with chemotherapy  . Tremor 04/23/2014   Right hand    Past Surgical History:  Procedure Laterality Date  . BILATERAL EAR DRUM SURGERY  1960'S  . BILE DUCT STENT PLACEMENT    . CATARACT EXTRACTION Bilateral   . ERCP N/A 08/09/2014   Procedure: ENDOSCOPIC RETROGRADE CHOLANGIOPANCREATOGRAPHY (  ERCP);  Surgeon: Carol Ada, MD;  Location: [facility] ENDOSCOPY;  Service: Endoscopy;  Laterality: N/A;  . ERCP N/A 05/23/2015   Procedure: ENDOSCOPIC RETROGRADE CHOLANGIOPANCREATOGRAPHY (ERCP);  Surgeon: Carol Ada, MD;  Location: [facility] ENDOSCOPY;  Service: Endoscopy;   Laterality: N/A;  . ERCP N/A 05/30/2015   Procedure: ENDOSCOPIC RETROGRADE CHOLANGIOPANCREATOGRAPHY (ERCP);  Surgeon: Carol Ada, MD;  Location: [facility];  Service: Endoscopy;  Laterality: N/A;  . ERCP N/A 02/18/2016   Procedure: ENDOSCOPIC RETROGRADE CHOLANGIOPANCREATOGRAPHY (ERCP);  Surgeon: Carol Ada, MD;  Location: [facility];  Service: Endoscopy;  Laterality: N/A;  . ESOPHAGOGASTRODUODENOSCOPY (EGD) WITH PROPOFOL N/A 08/09/2014   Procedure: ESOPHAGOGASTRODUODENOSCOPY (EGD) WITH PROPOFOL;  Surgeon: Carol Ada, MD;  Location: [facility];  Service: Endoscopy;  Laterality: N/A;  . EUS N/A 08/09/2014   Procedure: UPPER ENDOSCOPIC ULTRASOUND (EUS) RADIAL;  Surgeon: Carol Ada, MD;  Location: [facility] END[facility];  Service: Endoscopy;  Laterality: N/A;  . FINE NEEDLE ASPIRATION N/A 08/09/2014   Procedure: FINE NEEDLE ASPIRATION (FNA) LINEAR;  Surgeon: Carol Ada, MD;  Location: [facility] ENDOS[facility];  Service: Endoscopy;  Laterality: N/A;  . GASTROSTOMY TUBE PLACEMENT  10/2014  . IR GENERIC HISTORICAL  03/02/2016   IR FLUORO RM 30-60 MIN 03/02/2016 Nathan Mckusick, DO [facility]  . KNEE ARTHROSCOPY  12/21/2011   Procedure: ARTHROSCOPY KNEE;  Surgeon: Tobi Bastos, MD;  Location: [facility];  Service: Orthopedics;  Laterality: Right;  WITH MEDIAL MENISECTOMY  . LARYNGOSCOPY  01/2015 and 04/21/15  . ORIF LEFT RING FINGER  AND REVASCULARIZATION OF RADIAL SIDE  02-16-2007   DEGLOVING INJURY  . [place] cath    . PORTACATH PLACEMENT  Removed 06/20/3015   as of 05-27-15 remains right chest. 02-16-16 Port has been removed now.  Marland Kitchen ROTATOR CUFF REPAIR  2008   LEFT SHOULDER  . SHOULDER OPEN ROTATOR CUFF REPAIR Right 06/05/2014   Procedure: RIGHT ROTATOR CUFF REPAIR SHOULDER OPEN;  Surgeon: Latanya Maudlin, MD;  Location: [facility];  Service: Orthopedics;  Laterality: Right;  . TONSILLECTOMY     as child    Allergies: Augmentin [amoxicillin-pot clavulanate]; Fentanyl; Percocet  [oxycodone-acetaminophen]; Scopolamine; Zolpidem tartrate; Carboplatin; and Levaquin [levofloxacin]  Medications: Prior to Admission medications   Medication Sig Start Date End Date Taking? Authorizing Provider  aspirin 325 MG tablet Take 650 mg by mouth daily as needed for mild pain.    Historical Provider, MD  b complex vitamins tablet Take 1 tablet by mouth daily.    Historical Provider, MD  dicyclomine (BENTYL) 20 MG tablet Take 20 mg by mouth every 6 (six) hours as needed (for abdominal cramps).    Historical Provider, MD  diphenhydramine-acetaminophen (TYLENOL PM) 25-500 MG TABS tablet Take 1 tablet by mouth at bedtime as needed (for sleep).     Historical Provider, MD  HYDROmorphone (DILAUDID) 2 MG tablet Take 2-4 mg by mouth every 4 (four) hours as needed for severe pain.    Historical Provider, MD  hydrOXYzine (ATARAX/VISTARIL) 25 MG tablet Take 25 mg by mouth 3 (three) times daily as needed for itching.    Historical Provider, MD  LORazepam (ATIVAN) 1 MG tablet Take 0.5-1 tablets (0.5-1 mg total) by mouth every 4 (four) hours as needed for anxiety (and/or nausea). 03/05/16   [name], MD  midodrine (PROAMATINE) 5 MG tablet Take 5 mg by mouth 3 (three) times daily with meals.    Historical Provider, MD  Multiple Vitamin (MULTIVITAMIN WITH MINERALS) TABS tablet Take 1 tablet by mouth daily.    Historical Provider,  MD  mupirocin ointment (BACTROBAN) 2 % Apply 1 application topically 3 (three) times daily. Pt applies around tube area.    Historical Provider, MD  naproxen sodium (ANAPROX) 220 MG tablet Take 220-440 mg by mouth 2 (two) times daily as needed (for pain).     Historical Provider, MD  Nutritional Supplements (FEEDING SUPPLEMENT, OSMOLITE 1.5 CAL,) LIQD Place 237 mLs into feeding tube 3 (three) times daily.     Historical Provider, MD  Nutritional Supplements (TWOCAL HN) LIQD Place 237 mLs into feeding tube 3 (three) times daily.    Historical Provider, MD  omeprazole  (PRILOSEC) 40 MG capsule Take 40 mg by mouth at bedtime.     Historical Provider, MD  ondansetron (ZOFRAN) 8 MG tablet Take 8 mg by mouth every 8 (eight) hours as needed. 02/25/16   Historical Provider, MD  prednisoLONE acetate (PRED FORTE) 1 % ophthalmic suspension Place 1 drop into the right eye 4 (four) times daily. 02/11/16   Historical Provider, MD  promethazine (PHENERGAN) 25 MG tablet Take 25 mg by mouth every 6 (six) hours as needed for nausea or vomiting.    Historical Provider, MD  senna (SENOKOT) 8.6 MG tablet Take 1 tablet by mouth daily as needed for constipation.     Historical Provider, MD  vitamin C (ASCORBIC ACID) 500 MG tablet Take 500 mg by mouth daily.    Historical Provider, MD     Family History  Problem Relation Age of Onset  . Congestive Heart Failure Mother   . Cancer Sister   . Cancer Maternal Grandfather   . Colon cancer Father   . Heart attack Brother   . Colon cancer    . Heart attack    . Cancer    . Hypertension    . Hyperlipidemia      Social History   Social History  . Marital status: Married    Spouse name: N/A  . Number of children: 3  . Years of education: college   Occupational History  . 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 Smoking status: Former Smoker    Packs/day: 2.00    Years: 20.00    Types: Cigarettes    Quit date: 03/17/1981  . Smokeless tobacco: Never Used  . Alcohol use 4.8 oz/week    1 Cans of beer, 7 Standard drinks or equivalent per week     Comment: beer occ. monthly  . Drug use: No  . Sexual activity: Not Currently   Other Topics Concern  . None   Social History Narrative   Lives with his wife, [name]   Patient is right handed.   Patient drinks 1-2 cups caffeine daily.    ECOG Status: 1 - Symptomatic but completely ambulatory  Review of Systems: A 12 point ROS discussed and pertinent positives are indicated in the HPI above.  All other systems are  negative.  Review of Systems  Vital Signs: BP 119/62 (BP Location: Left Arm, Patient Position: Sitting, Cuff Size: Normal)   Pulse 84   Temp 97.8 F (36.6 C)   SpO2 98%   Physical Exam  Atraumatic, normocephalic. Mucous membranes moist pink. Mild scleral icterus. No scleral injection. Skin is mildly jaundiced. Alert and oriented to person place and time. Moving all 4 extremities equally and grossly with gross motor or gross sensory intact. He is using a cane for ambulation. Symmetric chest excursion on inspiration and expiration with no labored breathing.  Gastrostomy tube in place on the abdomen.  Mallampati Score:  2  Imaging: Ir Fluoro Rm 30-60 Min  Result Date: 03/02/2016 INDICATION: 70 year old male with a history of percutaneous gastrostomy tube placement with a retained T tack on CT dated 01/20/2016. EXAM: IR FLOURO RM 0-60 MIN COMPARISON:  CT 01/20/2016 MEDICATIONS: None ANESTHESIA/SEDATION: None FLUOROSCOPY TIME:  Fluoroscopy Time: 0 minutes 42 seconds (6 mGy). COMPLICATIONS: None TECHNIQUE: Informed written consent was obtained from the patient after a thorough discussion of the procedural risks, benefits and alternatives. All questions were addressed. Maximal Sterile Barrier Technique was utilized including caps, mask, sterile gowns, sterile gloves, sterile drape, hand hygiene and skin antiseptic. A timeout was performed prior to the initiation of the procedure. Patient is position in the supine position on the fluoroscopy table. The region surrounding G-tube was prepped and draped in the usual sterile fashion. The skin and subcutaneous tissues at the ostomy site were generously infiltrated 1% lidocaine for local anesthesia. Fluoroscopic survey was then performed at the site of the ostomy, searching for the retained foreign body (retained T tack from prior gastrostomy). This was not present at the ostomy site, and so the rest of the abdomen was surveyed. No evidence of the T  tack on the fluoroscopic survey. At this time, the granulation tissue at the ostomy site was treated with silver nitrate. Dressing was placed. Patient tolerated the procedure well and remained hemodynamically stable throughout. No complications were encountered and no significant blood loss encountered. FINDINGS: Fluoroscopic survey demonstrates no evidence of retained T tack, which seems to have passed through the GI system. Silver nitrate treatment of granulation tissue. IMPRESSION: Status post fluoroscopic survey of the abdomen demonstrating no evidence of retained foreign body on the current study, which seems to have passed through the GI tract. Local treatment of granulation tissue at the ostomy site with silver nitrate. Should this recur, topical treatment with repeat silver nitrate or alternatively a prescription for Kenalog cream, either 0.25% or 1% may be considered. Signed, Nathan Fanny. [name], DO [facility] Electronically Signed   By: [name] D.O.   On: 03/02/2016 16:56   Dg Ercp  Result Date: 02/18/2016 CLINICAL DATA:  70 year old male with prior CBD stent EXAM: ERCP TECHNIQUE: Multiple spot images obtained with the fluoroscopic device and submitted for interpretation post-procedure. FLUOROSCOPY TIME:  3 minutes 7 seconds COMPARISON:  CT 01/20/2016 FINDINGS: Limited fluoroscopic images during ERCP. Initial image demonstrates endoscope within the upper abdomen with cannulation of the ampulla. Partial opacification of the extrahepatic biliary system. Final image demonstrates stent exchange, with placement of a plastic stent into the common bile duct. IMPRESSION: Limited images during ERCP demonstrate evidence of CBD stent exchange with placement of a plastic biliary stent. Please refer to the dictated operative report for full details of intraoperative findings and procedure. Signed, Nathan Fanny. [name], DO [facility] Rad[facility] Electronically Signed   By: [name] D.O.   On: 02/18/2016 14:00    Labs:  CBC:  Recent Labs  01/01/16 1022 01/22/16 1010 02/12/16 1115 03/04/16 1020  WBC 7.2 7.5 5.9 8.2  HGB 13.0 13.1 12.6* 11.8*  HCT 37.5* 37.8* 37.6* 35.2*  PLT 102* 124* 115* 130*    COAGS:  Recent Labs  06/20/15 0735 09/08/15 0901  INR 1.10 1.22  APTT 30 25    BMP:  Recent Labs  05/23/15 0813 06/20/15 0735  10/21/15 0922 10/26/15 1715  01/01/16 1022 01/22/16 1010 02/12/16  1115 03/04/16 1020  NA 138 139  < > 136 135  < > 139 138 139 139  K 4.5 4.7  < > 4.1 4.3  < > 4.9 4.8 4.6 4.3  CL 102 102  --  107 104  --   --   --   --   --   CO2 23 26  < > 21* 21*  < > 21* 21* 19* 22  GLUCOSE 109* 117*  < > 138* 108*  < > 105 103 120 117  BUN 28* 31*  < > 33* 34*  < > 37.6* 34.3* 33.7* 31.8*  CALCIUM 10.2 10.5*  < > 9.0 9.4  < > 9.6 9.5 9.4 9.3  CREATININE 0.96 1.13  < > 1.06 1.19  < > 1.4* 1.2 1.1 1.2  GFRNONAA >60 >60  --  >60 >60  --   --   --   --   --   GFRAA >60 >60  --  >60 >60  --   --   --   --   --   < > = values in this interval not displayed.  LIVER FUNCTION TESTS:  Recent Labs  01/01/16 1022 01/22/16 1010 02/12/16 1115 03/04/16 1020  BILITOT 2.25* 3.04* 5.12* 8.14 Repeated and Verified*  AST 69* 84* 85* 123*  ALT 95* 121* 119* 136*  ALKPHOS 360* 450* 456* 408*  PROT 6.9 7.0 6.5 6.8  ALBUMIN 3.4* 3.3* 3.1* 3.0*    TUMOR MARKERS: No results for input(s): AFPTM, CEA, CA199, CHROMGRNA in the last 8760 hours.  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 The stricture is most likely related to radiotherapy for his treated metastatic lymphadenopathy.  He has had a prior internal/external biliary drain, which I think may be indicated at this point. We are awaiting results of CT scan to confirm bile duct  obstruction.  He has an appointment tomorrow to see [name] regarding his therapy.  I had a lengthy discussion regarding PTCA and drainage with the patient and his wife. Specific Risks and Benefits discussed with the patient including bleeding, infection, damage to adjacent structures, bowel perforation/fistula connection, sepsis, need for hospitalization, cardiopulmonary collapse, death.. All of the patient's questions were answered, patient is agreeable to proceed. Consent signed and in chart.  Plan: Await results of pending CT study.  Schedule for PTC and drainage, with the indication of biliary obstruction (posttreatment common bile duct stricture).He does prefer Dr. Earleen [name], although Dr. Kathlene Cote and Dr. Pascal Lux have both been involved with his care.   Preoperative lab should include INR.  Thank you for this interesting consult.  I greatly enjoyed meeting Tramel Westbrook and look forward to participating in their care.  A copy of this report was sent to the requesting provider on this date.  Electronically Signed: Corrie [name] 03/09/2016, 9:38 AM   I spent a total of  40 Minutes   in face to face in clinical consultation, greater than 50% of which was counseling/coordinating care for biliary obstruction, jaundice/icterus, possible PTCA and drainage.

## 2016-03-09 NOTE — Telephone Encounter (Signed)
Wife called for dilaudid refill. She will be coming later to pick up contrast and would like to pick up rx. I told her no later than 4 pm.

## 2016-03-10 ENCOUNTER — Ambulatory Visit: Payer: Medicare Other

## 2016-03-10 ENCOUNTER — Ambulatory Visit (HOSPITAL_BASED_OUTPATIENT_CLINIC_OR_DEPARTMENT_OTHER): Payer: Medicare Other | Admitting: Internal Medicine

## 2016-03-10 ENCOUNTER — Other Ambulatory Visit: Payer: Self-pay | Admitting: Internal Medicine

## 2016-03-10 ENCOUNTER — Encounter: Payer: Self-pay | Admitting: Internal Medicine

## 2016-03-10 ENCOUNTER — Telehealth: Payer: Self-pay | Admitting: Internal Medicine

## 2016-03-10 ENCOUNTER — Other Ambulatory Visit (HOSPITAL_BASED_OUTPATIENT_CLINIC_OR_DEPARTMENT_OTHER): Payer: Medicare Other

## 2016-03-10 ENCOUNTER — Ambulatory Visit (HOSPITAL_COMMUNITY)
Admission: RE | Admit: 2016-03-10 | Discharge: 2016-03-10 | Disposition: A | Discharge status: Home / Self Care | Payer: Medicare Other | Source: Ambulatory Visit | Attending: Internal Medicine | Admitting: Internal Medicine

## 2016-03-10 DIAGNOSIS — K831 Obstruction of bile duct: Secondary | ICD-10-CM

## 2016-03-10 DIAGNOSIS — C801 Malignant (primary) neoplasm, unspecified: Secondary | ICD-10-CM

## 2016-03-10 DIAGNOSIS — E46 Unspecified protein-calorie malnutrition: Secondary | ICD-10-CM | POA: Diagnosis not present

## 2016-03-10 DIAGNOSIS — C3490 Malignant neoplasm of unspecified part of unspecified bronchus or lung: Secondary | ICD-10-CM

## 2016-03-10 DIAGNOSIS — K8020 Calculus of gallbladder without cholecystitis without obstruction: Secondary | ICD-10-CM | POA: Diagnosis not present

## 2016-03-10 DIAGNOSIS — Z5112 Encounter for antineoplastic immunotherapy: Secondary | ICD-10-CM

## 2016-03-10 DIAGNOSIS — R17 Unspecified jaundice: Secondary | ICD-10-CM | POA: Insufficient documentation

## 2016-03-10 DIAGNOSIS — C3492 Malignant neoplasm of unspecified part of left bronchus or lung: Secondary | ICD-10-CM

## 2016-03-10 DIAGNOSIS — I700 Atherosclerosis of aorta: Secondary | ICD-10-CM | POA: Diagnosis not present

## 2016-03-10 DIAGNOSIS — I77811 Abdominal aortic ectasia: Secondary | ICD-10-CM | POA: Insufficient documentation

## 2016-03-10 DIAGNOSIS — R5382 Chronic fatigue, unspecified: Secondary | ICD-10-CM

## 2016-03-10 DIAGNOSIS — I2510 Atherosclerotic heart disease of native coronary artery without angina pectoris: Secondary | ICD-10-CM | POA: Insufficient documentation

## 2016-03-10 DIAGNOSIS — E43 Unspecified severe protein-calorie malnutrition: Secondary | ICD-10-CM

## 2016-03-10 LAB — CBC WITH DIFFERENTIAL/PLATELET
BASO%: 0.4 % (ref 0.0–2.0)
Basophils Absolute: 0 10*3/uL (ref 0.0–0.1)
EOS%: 2.8 % (ref 0.0–7.0)
Eosinophils Absolute: 0.2 10*3/uL (ref 0.0–0.5)
HCT: 35.9 % — ABNORMAL LOW (ref 38.4–49.9)
HGB: 12 g/dL — ABNORMAL LOW (ref 13.0–17.1)
LYMPH%: 7.7 % — AB (ref 14.0–49.0)
MCH: 35.4 pg — ABNORMAL HIGH (ref 27.2–33.4)
MCHC: 33.4 g/dL (ref 32.0–36.0)
MCV: 106.1 fL — AB (ref 79.3–98.0)
MONO#: 1 10*3/uL — AB (ref 0.1–0.9)
MONO%: 11.4 % (ref 0.0–14.0)
NEUT#: 6.6 10*3/uL — ABNORMAL HIGH (ref 1.5–6.5)
NEUT%: 77.7 % — ABNORMAL HIGH (ref 39.0–75.0)
PLATELETS: 132 10*3/uL — AB (ref 140–400)
RBC: 3.38 10*6/uL — AB (ref 4.20–5.82)
RDW: 14.3 % (ref 11.0–14.6)
WBC: 8.5 10*3/uL (ref 4.0–10.3)
lymph#: 0.7 10*3/uL — ABNORMAL LOW (ref 0.9–3.3)

## 2016-03-10 LAB — COMPREHENSIVE METABOLIC PANEL
ALT: 121 U/L — AB (ref 0–55)
ANION GAP: 11 meq/L (ref 3–11)
AST: 105 U/L — ABNORMAL HIGH (ref 5–34)
Albumin: 3.1 g/dL — ABNORMAL LOW (ref 3.5–5.0)
Alkaline Phosphatase: 388 U/L — ABNORMAL HIGH (ref 40–150)
BUN: 31.8 mg/dL — ABNORMAL HIGH (ref 7.0–26.0)
CHLORIDE: 103 meq/L (ref 98–109)
CO2: 22 meq/L (ref 22–29)
Calcium: 9.3 mg/dL (ref 8.4–10.4)
Creatinine: 1.1 mg/dL (ref 0.7–1.3)
EGFR: 65 mL/min/{1.73_m2} — ABNORMAL LOW (ref >90)
Glucose: 101 mg/dl (ref 70–140)
Potassium: 4.2 mEq/L (ref 3.5–5.1)
SODIUM: 136 meq/L (ref 136–145)
Total Bilirubin: 8.17 mg/dL (ref 0.20–1.20)
Total Protein: 6.6 g/dL (ref 6.4–8.3)

## 2016-03-10 MED ORDER — IOPAMIDOL (ISOVUE-300) INJECTION 61%
100.0000 mL | Freq: Once | INTRAVENOUS | Status: AC | PRN
  Administered 2016-03-10: 100 mL via INTRAVENOUS

## 2016-03-10 NOTE — Progress Notes (Signed)
[place] Telephone:([phone]) [phone]   Fax:([phone]) Yoe, MD [address] [place] [address]  DIAGNOSIS: Metastatic non-small cell lung cancer, adenocarcinoma diagnosed in September 2010. PDL 1 expression 50%.   PRIOR THERAPY: #1 status post 6 cycles of systemic chemotherapy with carboplatin, Alimta and Avastin given every 3 weeks last dose given 04/23/2009 with disease stabilization.  #2 status post palliative radiotherapy to the mediastinum under the care of Dr. Pablo Ledger. The patient received a total dose of 3000 cGY and 12 fractions completed 01/21/2009.  #3 Maintenance chemotherapy with Alimta at 500 mg per meter squared and Avastin at 15 mg per kilogram given every 3 weeks status post 35 cycles.  #4 Maintenance chemotherapy with Alimta 500 mg/M2 and Avastin 15 mg/kg every 4 weeks,status post 17 cycles, discontinued secondary to disease progression.  #5 Systemic chemotherapy with carboplatin for AUC of 5, Alimta 500 mg/M2 and Avastin 15 mg/kg every 3 weeks, status post 3 cycles, last cycle was given 12/28/2012. Carboplatin was discontinued starting cycle #2 secondary to hypersensitivity reaction. #6 Maintenance chemotherapy again with Alimta 500 mg/M2 and Avastin 15 mg/kg every 3 weeks, first cycle 01/23/2013. Status post 6 cycles. #7 palliative radiotherapy to the large abdominal mass under the care of Dr. Pablo Ledger. #8 Immunotherapy with Nivolumab 3 MG/KG every 2 weeks. First dose 10/04/2014. Status post 3 cycles discontinued secondary to disease progression. #9. Status post palliative radiotherapy to the progressive retroperitoneal lymph nodes under the care of Dr. Sondra Come.  CURRENT THERAPY: Ketruda 200 mg IV every 3 weeks, status post 4 cycles. First dose was given 11/20/2015. Treatment is currently on hold secondary to hyper bilirubinemia.  INTERVAL HISTORY: [name] 70 y.o. male returns to the  clinic today for follow-up visit accompanied by his wife. He was supposed to start cycle #5 of his treatment with [place] (pembrolizumab) last week but unfortunately his total bilirubin was further elevated to 8.14. His treatment is currently on hold secondary to hyperbilirubinemia. He was referred to interventional radiology for evaluation and consideration of percutaneous stent placement. He was seen by Dr. [name] and had fluoroscopic survey of the abdomen demonstrating no evidence of retained foreign body on the current study which seems to have passed through the GI tract. He also had CT of the abdomen with and without contrast today. He is here today for evaluation and discussion of his treatment options. He continues to have fatigue and weight loss. He denied having any chest pain but has shortness of breath with exertion with no cough or hemoptysis. He has no fever or chills.  MEDICAL HISTORY: Past Medical History:  Diagnosis Date  . Abnormality of gait 03/12/2015  . Acute meniscal tear of knee RIGHT KNEE  . Antineoplastic chemotherapy induced anemia 03/04/2016  . ARF (acute renal failure) ([place]) 03/2015   pre-renal patient was septic. is improved now.  . Arthritis RIGHT SHOULDER  . BPH (benign prostatic hypertrophy)   . Dizziness and giddiness 04/23/2014  . Gastrostomy tube dependent ([place])    Continous feedings per Gordy Councilman tube"unable to swallow"- Infusion pump for Nutrition  . HOH (hard of hearing)    bilateral hearing aids  . Hypertension    no longer an issue. Now runs usually low- tekes med to increase blood pressure  . Immature cataract BILATERAL  . Non-small cell lung cancer ([place]) DX SEPT 2010  W/ CHEMORADIATION AT THAT TIME -- NOW  W/ METS--  CURRENTLY ON  MAINTENANCE CHEMO TX  EVERY 30 DAYS   ONCOLOGIST- [facility]- lung cancer with metastasis.  . Orthostatic hypotension    B/P tends to run 110/70 or less -using med to raise B/P.  Marland Kitchen Pneumonia   . Radiation 01/08/09-01/21/09    Mediastinum 30 Gy x 12 fractions  . Radiation 08/19/14-09/04/14   Palliative RT Porta hepatic LN 30 Gy  . Sepsis ([place]) 03/2015  . Shortness of breath dyspnea    with exertion  . Swallowing impairment    "Unable to swallow"  . Transfusion history    last 1-2 yrs ago with chemotherapy  . Tremor 04/23/2014   Right hand    ALLERGIES:  is allergic to augmentin [amoxicillin-pot clavulanate]; fentanyl; percocet [oxycodone-acetaminophen]; scopolamine; zolpidem tartrate; carboplatin; and levaquin [levofloxacin].  MEDICATIONS:  Current Outpatient Prescriptions  Medication Sig Dispense Refill  . aspirin 325 MG tablet Take 650 mg by mouth daily as needed for mild pain.    Marland Kitchen b complex vitamins tablet Take 1 tablet by mouth daily.    Marland Kitchen dicyclomine (BENTYL) 20 MG tablet Take 20 mg by mouth every 6 (six) hours as needed (for abdominal cramps).    . diphenhydramine-acetaminophen (TYLENOL PM) 25-500 MG TABS tablet Take 1 tablet by mouth at bedtime as needed (for sleep).     Marland Kitchen HYDROmorphone (DILAUDID) 2 MG tablet Take 1-2 tablets (2-4 mg total) by mouth every 4 (four) hours as needed for severe pain. 30 tablet 0  . hydrOXYzine (ATARAX/VISTARIL) 25 MG tablet Take 25 mg by mouth 3 (three) times daily as needed for itching.    Marland Kitchen LORazepam (ATIVAN) 1 MG tablet Take 0.5-1 tablets (0.5-1 mg total) by mouth every 4 (four) hours as needed for anxiety (and/or nausea). 30 tablet 0  . midodrine (PROAMATINE) 5 MG tablet Take 5 mg by mouth 3 (three) times daily with meals.    . Multiple Vitamin (MULTIVITAMIN WITH MINERALS) TABS tablet Take 1 tablet by mouth daily.    . mupirocin ointment (BACTROBAN) 2 % Apply 1 application topically 3 (three) times daily. Pt applies around tube area.  0  . naproxen sodium (ANAPROX) 220 MG tablet Take 220-440 mg by mouth 2 (two) times daily as needed (for pain).     . Nutritional Supplements (FEEDING SUPPLEMENT, OSMOLITE 1.5 CAL,) LIQD Place 237 mLs into feeding tube 3 (three) times daily.      . Nutritional Supplements (TWOCAL HN) LIQD Place 237 mLs into feeding tube 3 (three) times daily.    Marland Kitchen omeprazole (PRILOSEC) 40 MG capsule Take 40 mg by mouth at bedtime.     . ondansetron (ZOFRAN) 8 MG tablet Take 8 mg by mouth every 8 (eight) hours as needed.    . prednisoLONE acetate (PRED FORTE) 1 % ophthalmic suspension Place 1 drop into the right eye 4 (four) times daily.    . promethazine (PHENERGAN) 25 MG tablet Take 25 mg by mouth every 6 (six) hours as needed for nausea or vomiting.    . senna (SENOKOT) 8.6 MG tablet Take 1 tablet by mouth daily as needed for constipation.     . vitamin C (ASCORBIC ACID) 500 MG tablet Take 500 mg by mouth daily.     No current facility-administered medications for this visit.     SURGICAL HISTORY:  Past Surgical History:  Procedure Laterality Date  . BILATERAL EAR DRUM SURGERY  1960'S  . BILE DUCT STENT PLACEMENT    . CATARACT EXTRACTION Bilateral   . ERCP N/A 08/09/2014   Procedure:  ENDOSCOPIC RETROGRADE CHOLANGIOPANCREATOGRAPHY (ERCP);  Surgeon: Carol Ada, MD;  Location: [facility];  Service: Endoscopy;  Laterality: N/A;  . ERCP N/A 05/23/2015   Procedure: ENDOSCOPIC RETROGRADE CHOLANGIOPANCREATOGRAPHY (ERCP);  Surgeon: Carol Ada, MD;  Location: [facility] ENDOSCOPY;  Service: Endoscopy;  Laterality: N/A;  . ERCP N/A 05/30/2015   Procedure: ENDOSCOPIC RETROGRADE CHOLANGIOPANCREATOGRAPHY (ERCP);  Surgeon: Carol Ada, MD;  Location: [facility];  Service: Endoscopy;  Laterality: N/A;  . ERCP N/A 02/18/2016   Procedure: ENDOSCOPIC RETROGRADE CHOLANGIOPANCREATOGRAPHY (ERCP);  Surgeon: Carol Ada, MD;  Location: [facility];  Service: Endoscopy;  Laterality: N/A;  . ESOPHAGOGASTRODUODENOSCOPY (EGD) WITH PROPOFOL N/A 08/09/2014   Procedure: ESOPHAGOGASTRODUODENOSCOPY (EGD) WITH PROPOFOL;  Surgeon: Carol Ada, MD;  Location: [facility] ENDOSCOPY;  Service: Endoscopy;  Laterality: N/A;  . EUS N/A 08/09/2014   Procedure: UPPER ENDOSCOPIC ULTRASOUND (EUS)  RADIAL;  Surgeon: Carol Ada, MD;  Location: [facility] ENDOSCOPY;  Service: Endoscopy;  Laterality: N/A;  . FINE NEEDLE ASPIRATION N/A 08/09/2014   Procedure: FINE NEEDLE ASPIRATION (FNA) LINEAR;  Surgeon: Carol Ada, MD;  Location: [facility] ENDOS[facility];  Service: Endoscopy;  Laterality: N/A;  . GASTROSTOMY TUBE PLACEMENT  10/2014  . IR GENERIC HISTORICAL  03/02/2016   IR FLUORO RM 30-60 MIN 03/02/2016 Corrie Mckusick, DO [facility]-INTERV RAD  . KNEE ARTHROSCOPY  12/21/2011   Procedure: ARTHROSCOPY KNEE;  Surgeon: Tobi Bastos, MD;  Location: [facility];  Service: Orthopedics;  Laterality: Right;  WITH MEDIAL MENISECTOMY  . LARYNGOSCOPY  01/2015 and 04/21/15  . ORIF LEFT RING FINGER  AND REVASCULARIZATION OF RADIAL SIDE  02-16-2007   DEGLOVING INJURY  . [place] cath    . PORTACATH PLACEMENT  Removed 06/20/3015   as of 05-27-15 remains right chest. 02-16-16 Port has been removed now.  Marland Kitchen ROTATOR CUFF REPAIR  2008   LEFT SHOULDER  . SHOULDER OPEN ROTATOR CUFF REPAIR Right 06/05/2014   Procedure: RIGHT ROTATOR CUFF REPAIR SHOULDER OPEN;  Surgeon: Latanya Maudlin, MD;  Location: [facility];  Service: Orthopedics;  Laterality: Right;  . TONSILLECTOMY     as child    REVIEW OF SYSTEMS:  Constitutional: positive for fatigue and weight loss Eyes: positive for icterus Ears, nose, mouth, throat, and face: negative Respiratory: positive for dyspnea on exertion Cardiovascular: negative Gastrointestinal: negative Genitourinary:negative Integument/breast: negative Hematologic/lymphatic: negative Musculoskeletal:positive for muscle weakness Neurological: negative Behavioral/Psych: negative Endocrine: negative Allergic/Immunologic: negative   PHYSICAL EXAMINATION: General appearance: alert, cooperative, fatigued, icteric and no distress Head: Normocephalic, without obvious abnormality, atraumatic Neck: no adenopathy, no JVD, supple, symmetrical, trachea midline and thyroid not enlarged, symmetric, no  tenderness/mass/nodules Lymph nodes: Cervical, supraclavicular, and axillary nodes normal. Resp: clear to auscultation bilaterally Back: symmetric, no curvature. ROM normal. No CVA tenderness. Cardio: regular rate and rhythm, S1, S2 normal, no murmur, click, rub or gallop GI: soft, non-tender; bowel sounds normal; no masses,  no organomegaly Extremities: extremities normal, atraumatic, no cyanosis or edema Neurologic: Alert and oriented X 3, normal strength and tone. Normal symmetric reflexes. Normal coordination and gait  ECOG PERFORMANCE STATUS: 1 - Symptomatic but completely ambulatory  There were no vitals taken for this visit.  LABORATORY DATA: Lab Results  Component Value Date   WBC 8.5 03/10/2016   HGB 12.0 (L) 03/10/2016   HCT 35.9 (L) 03/10/2016   MCV 106.1 (H) 03/10/2016   PLT 132 (L) 03/10/2016      Chemistry      Component Value Date/Time   NA 136 03/10/2016 1351   K 4.2 03/10/2016 1351   CL  104 10/26/2015 1715   CL 109 (H) 08/29/2012 0856   CO2 22 03/10/2016 1351   BUN 31.8 (H) 03/10/2016 1351   CREATININE 1.1 03/10/2016 1351      Component Value Date/Time   CALCIUM 9.3 03/10/2016 1351   ALKPHOS 388 (H) 03/10/2016 1351   AST 105 (H) 03/10/2016 1351   ALT 121 (H) 03/10/2016 1351   BILITOT 8.17 (HH) 03/10/2016 1351       RADIOGRAPHIC STUDIES: Ir Fluoro Rm 30-60 Min  Result Date: 03/02/2016 INDICATION: 70 year old male with a history of percutaneous gastrostomy tube placement with a retained T tack on CT dated 01/20/2016. EXAM: IR FLOURO RM 0-60 MIN COMPARISON:  CT 01/20/2016 MEDICATIONS: None ANESTHESIA/SEDATION: None FLUOROSCOPY TIME:  Fluoroscopy Time: 0 minutes 42 seconds (6 mGy). COMPLICATIONS: None TECHNIQUE: Informed written consent was obtained from the patient after a thorough discussion of the procedural risks, benefits and alternatives. All questions were addressed. Maximal Sterile Barrier Technique was utilized including caps, mask, sterile gowns,  sterile gloves, sterile drape, hand hygiene and skin antiseptic. A timeout was performed prior to the initiation of the procedure. Patient is position in the supine position on the fluoroscopy table. The region surrounding G-tube was prepped and draped in the usual sterile fashion. The skin and subcutaneous tissues at the ostomy site were generously infiltrated 1% lidocaine for local anesthesia. Fluoroscopic survey was then performed at the site of the ostomy, searching for the retained foreign body (retained T tack from prior gastrostomy). This was not present at the ostomy site, and so the rest of the abdomen was surveyed. No evidence of the T tack on the fluoroscopic survey. At this time, the granulation tissue at the ostomy site was treated with silver nitrate. Dressing was placed. Patient tolerated the procedure well and remained hemodynamically stable throughout. No complications were encountered and no significant blood loss encountered. FINDINGS: Fluoroscopic survey demonstrates no evidence of retained T tack, which seems to have passed through the GI system. Silver nitrate treatment of granulation tissue. IMPRESSION: Status post fluoroscopic survey of the abdomen demonstrating no evidence of retained foreign body on the current study, which seems to have passed through the GI tract. Local treatment of granulation tissue at the ostomy site with silver nitrate. Should this recur, topical treatment with repeat silver nitrate or alternatively a prescription for Kenalog cream, either 0.25% or 1% may be considered. Signed, Dulcy Fanny. [name], DO [facility] Radiology Electronically Signed   By: Corrie Mckusick D.O.   On: 03/02/2016 16:56   Ct Abdomen W Wo Contrast  Result Date: 03/10/2016 CLINICAL DATA:  Non-small cell lung cancer, metastatic, treated with radiotherapy and chemotherapy. Elevated bilirubin. EXAM: CT ABDOMEN WITHOUT AND WITH CONTRAST TECHNIQUE:  Multidetector CT imaging of the abdomen was performed following the standard protocol before and following the bolus administration of intravenous contrast. CONTRAST:  140m ISOVUE-300 IOPAMIDOL (ISOVUE-300) INJECTION 61% COMPARISON:  01/20/2016. FINDINGS: Lower chest: Lung bases show volume loss in the right lower lobe with associated pleural thickening. Heart size normal. Coronary artery calcification. No pericardial or pleural effusion. Hepatobiliary: Mild pneumobilia with a common bile duct stent in place. Liver is otherwise unremarkable. Stones and air are seen in the gallbladder. No biliary ductal dilatation. Pancreas: Negative. Spleen: Negative. Adrenals/Urinary Tract: Adrenal glands and right kidney are unremarkable. A 1.5 cm cyst is seen in the left kidney. Kidneys are otherwise unremarkable. Stomach/Bowel: Percutaneous gastrostomy. Stomach and visualized portions of the small bowel and colon are otherwise unremarkable. Vascular/Lymphatic:  Atherosclerotic calcification of the arterial vasculature. Infrarenal aorta measures up to 2.7 cm. No pathologically enlarged lymph nodes. Other: No free fluid. Mesenteries and peritoneum are unremarkable. Fluid density structure adjacent to the caudate lobe is unchanged. Musculoskeletal: No worrisome lytic or sclerotic lesions. IMPRESSION: 1. No acute findings to explain the patient's elevated bilirubin. Common bile duct stent is in place. 2. Cholelithiasis. 3. Aortic atherosclerosis (ICD10-170.0). Coronary artery calcification. 4. Ectatic abdominal aorta at risk for aneurysm development. Recommend followup by ultrasound in 5 years. This recommendation follows ACR consensus guidelines: White Paper of the ACR Incidental Findings Committee II on Vascular Findings. J Am Coll Radiol 2013; 10:789-794. Electronically Signed   By: Lorin Picket M.D.   On: 03/10/2016 13:50   Dg Ercp  Result Date: 02/18/2016 CLINICAL DATA:  70 year old male with prior CBD stent EXAM: ERCP  TECHNIQUE: Multiple spot images obtained with the fluoroscopic device and submitted for interpretation post-procedure. FLUOROSCOPY TIME:  3 minutes 7 seconds COMPARISON:  CT 01/20/2016 FINDINGS: Limited fluoroscopic images during ERCP. Initial image demonstrates endoscope within the upper abdomen with cannulation of the ampulla. Partial opacification of the extrahepatic biliary system. Final image demonstrates stent exchange, with placement of a plastic stent into the common bile duct. IMPRESSION: Limited images during ERCP demonstrate evidence of CBD stent exchange with placement of a plastic biliary stent. Please refer to the dictated operative report for full details of intraoperative findings and procedure. Signed, Dulcy Fanny. [name], DO [facility] Radiology Electronically Signed   By: Corrie Mckusick D.O.   On: 02/18/2016 14:00    ASSESSMENT AND PLAN: This is a very pleasant 70 years old white male with the following medical problems: 1) stage IV non-small cell lung cancer, adenocarcinoma with a very complicated course, status post several chemotherapy regimens and currently on treatment with immunotherapy with Ketruda (pembrolizumab) status post 4 cycles. He was supposed to start his treatment with [place] (pembrolizumab) last week but unfortunately the patient continues to have persistent hyperbilirubinemia. I advised the patient to continue to hold his treatment with [place] (pembrolizumab) for now until improvement of his serum bilirubin. The recent CT scan of the abdomen showed no clear evidence for disease progression or obstruction. I discussed the scan results with the patient and his wife. 2) hyperbilirubinemia: Likely secondary to biliary obstruction but this could be also an adverse effect from treatment with [place] (pembrolizumab). I had a lengthy discussion with the patient and his wife today about his condition. I also spoke to Dr. [name]. He  would consider him for evaluation for any blockage that can be corrected. If there is no clear biliary obstruction, I would consider the patient for high-dose prednisone be tapered over several weeks for questionable immunotherapy mediated hepatitis. I will repeat his comprehensive metabolic panel week for reevaluation of his serum bilirubin. 3) malnutrition: The patient will continue his current PEG tube feeding. The patient would come back for follow-up visit in 3 weeks for evaluation before resuming his treatment with immunotherapy. He was advised to call immediately if he has any concerning symptoms in the interval. The patient voices understanding of current disease status and treatment options and is in agreement with the current care plan.  All questions were answered. The patient knows to call the clinic with any problems, questions or concerns. We can certainly see the patient much sooner if necessary.  Disclaimer: This note was dictated with voice recognition software. Similar sounding words can inadvertently be transcribed and may not be  corrected upon review.

## 2016-03-10 NOTE — Progress Notes (Signed)
Treatment held on 12/7 due to elevated bilirubin.  Bili 8.17 today, reviewed with Dr. [name]. Per MD, pt to be assessed on collab side/ no treatment today.  Pt and wife escorted to exam room on collab side.

## 2016-03-10 NOTE — Telephone Encounter (Signed)
Gave patient avs report and appointments for December and January  °

## 2016-03-11 ENCOUNTER — Other Ambulatory Visit: Payer: Self-pay | Admitting: General Surgery

## 2016-03-11 ENCOUNTER — Other Ambulatory Visit (HOSPITAL_COMMUNITY): Payer: Self-pay | Admitting: Interventional Radiology

## 2016-03-11 DIAGNOSIS — K831 Obstruction of bile duct: Secondary | ICD-10-CM

## 2016-03-12 ENCOUNTER — Encounter (HOSPITAL_COMMUNITY): Payer: Self-pay

## 2016-03-12 ENCOUNTER — Other Ambulatory Visit (HOSPITAL_COMMUNITY): Payer: Self-pay | Admitting: Interventional Radiology

## 2016-03-12 ENCOUNTER — Ambulatory Visit (HOSPITAL_COMMUNITY)
Admission: RE | Admit: 2016-03-12 | Discharge: 2016-03-12 | Disposition: A | Discharge status: Home / Self Care | Payer: Medicare Other | Source: Ambulatory Visit | Attending: Interventional Radiology | Admitting: Interventional Radiology

## 2016-03-12 DIAGNOSIS — Z4682 Encounter for fitting and adjustment of non-vascular catheter: Secondary | ICD-10-CM | POA: Diagnosis present

## 2016-03-12 DIAGNOSIS — K8021 Calculus of gallbladder without cholecystitis with obstruction: Secondary | ICD-10-CM | POA: Insufficient documentation

## 2016-03-12 DIAGNOSIS — Z7982 Long term (current) use of aspirin: Secondary | ICD-10-CM | POA: Diagnosis not present

## 2016-03-12 DIAGNOSIS — Z931 Gastrostomy status: Secondary | ICD-10-CM | POA: Diagnosis not present

## 2016-03-12 DIAGNOSIS — I10 Essential (primary) hypertension: Secondary | ICD-10-CM | POA: Diagnosis not present

## 2016-03-12 DIAGNOSIS — I700 Atherosclerosis of aorta: Secondary | ICD-10-CM | POA: Diagnosis not present

## 2016-03-12 DIAGNOSIS — Z87891 Personal history of nicotine dependence: Secondary | ICD-10-CM | POA: Diagnosis not present

## 2016-03-12 DIAGNOSIS — Z800 Family history of malignant neoplasm of digestive organs: Secondary | ICD-10-CM | POA: Insufficient documentation

## 2016-03-12 DIAGNOSIS — C3490 Malignant neoplasm of unspecified part of unspecified bronchus or lung: Secondary | ICD-10-CM | POA: Insufficient documentation

## 2016-03-12 DIAGNOSIS — N400 Enlarged prostate without lower urinary tract symptoms: Secondary | ICD-10-CM | POA: Insufficient documentation

## 2016-03-12 DIAGNOSIS — Z923 Personal history of irradiation: Secondary | ICD-10-CM | POA: Diagnosis not present

## 2016-03-12 DIAGNOSIS — Z9221 Personal history of antineoplastic chemotherapy: Secondary | ICD-10-CM | POA: Diagnosis not present

## 2016-03-12 DIAGNOSIS — K831 Obstruction of bile duct: Secondary | ICD-10-CM

## 2016-03-12 DIAGNOSIS — Z8249 Family history of ischemic heart disease and other diseases of the circulatory system: Secondary | ICD-10-CM | POA: Insufficient documentation

## 2016-03-12 DIAGNOSIS — Z880 Allergy status to penicillin: Secondary | ICD-10-CM | POA: Insufficient documentation

## 2016-03-12 HISTORY — PX: IR GENERIC HISTORICAL: IMG1180011

## 2016-03-12 LAB — PROTIME-INR
INR: 1.28
Prothrombin Time: 16.1 seconds — ABNORMAL HIGH (ref 11.4–15.2)

## 2016-03-12 LAB — APTT: aPTT: 33 seconds (ref 24–36)

## 2016-03-12 MED ORDER — HYDROMORPHONE HCL 1 MG/ML IJ SOLN
INTRAMUSCULAR | Status: AC
  Administered 2016-03-12: 1 mg via INTRAVENOUS
  Filled 2016-03-12: qty 1

## 2016-03-12 MED ORDER — HYDROMORPHONE HCL 1 MG/ML IJ SOLN
INTRAMUSCULAR | Status: AC | PRN
  Administered 2016-03-12 (×4): 0.5 mg via INTRAVENOUS

## 2016-03-12 MED ORDER — HYDROMORPHONE HCL 1 MG/ML IJ SOLN
1.0000 mg | Freq: Once | INTRAMUSCULAR | Status: AC
  Administered 2016-03-12 (×2): 1 mg via INTRAVENOUS

## 2016-03-12 MED ORDER — HYDROMORPHONE HCL 1 MG/ML IJ SOLN
INTRAMUSCULAR | Status: AC
  Filled 2016-03-12: qty 2

## 2016-03-12 MED ORDER — MIDAZOLAM HCL 2 MG/2ML IJ SOLN
INTRAMUSCULAR | Status: AC | PRN
  Administered 2016-03-12 (×2): 1 mg via INTRAVENOUS
  Administered 2016-03-12: 0.5 mg via INTRAVENOUS
  Administered 2016-03-12: 1 mg via INTRAVENOUS
  Administered 2016-03-12: 0.5 mg via INTRAVENOUS

## 2016-03-12 MED ORDER — DIPHENHYDRAMINE HCL 50 MG/ML IJ SOLN: 12.5000 mg | Freq: Once | INTRAMUSCULAR | Status: DC

## 2016-03-12 MED ORDER — MIDAZOLAM HCL 2 MG/2ML IJ SOLN
INTRAMUSCULAR | Status: AC
  Filled 2016-03-12: qty 2

## 2016-03-12 MED ORDER — SODIUM CHLORIDE 0.9 % IV SOLN
INTRAVENOUS | Status: DC
  Administered 2016-03-12: 13:00:00 via INTRAVENOUS

## 2016-03-12 MED ORDER — KETOROLAC TROMETHAMINE 30 MG/ML IJ SOLN
INTRAMUSCULAR | Status: AC
  Filled 2016-03-12: qty 1

## 2016-03-12 MED ORDER — KETOROLAC TROMETHAMINE 30 MG/ML IJ SOLN
30.0000 mg | Freq: Once | INTRAMUSCULAR | Status: AC
  Administered 2016-03-12: 30 mg via INTRAVENOUS

## 2016-03-12 MED ORDER — LIDOCAINE HCL 1 % IJ SOLN
INTRAMUSCULAR | Status: AC
  Filled 2016-03-12: qty 20

## 2016-03-12 MED ORDER — CLINDAMYCIN PHOSPHATE 600 MG/50ML IV SOLN
600.0000 mg | INTRAVENOUS | Status: AC
  Administered 2016-03-12: 600 mg via INTRAVENOUS
  Filled 2016-03-12: qty 50

## 2016-03-12 MED ORDER — IOPAMIDOL (ISOVUE-300) INJECTION 61%
INTRAVENOUS | Status: AC
  Administered 2016-03-12: 25 mL
  Filled 2016-03-12: qty 50

## 2016-03-12 MED ORDER — LIDOCAINE HCL 1 % IJ SOLN
INTRAMUSCULAR | Status: AC | PRN
  Administered 2016-03-12: 20 mL

## 2016-03-12 MED ORDER — SODIUM CHLORIDE 0.9 % IV SOLN: INTRAVENOUS | Status: AC

## 2016-03-12 NOTE — Sedation Documentation (Signed)
Patient is resting comfortably. 

## 2016-03-12 NOTE — Discharge Instructions (Addendum)
FLUSH CATHETER WITH 10CC SALINE SYRINGE ONCE A DAY  CALL [phone] IF ANY PROBLEMS,QUESTIONS, OR CONCERNS   Biliary Drainage Catheter Placement, Care After Refer to this sheet in the next few weeks. These instructions provide you with information on caring for yourself after your procedure. Your health care provider may also give you more specific instructions. Your treatment has been planned according to current medical practices, but problems sometimes occur. Call your health care provider if you have any problems or questions after your procedure. WHAT TO EXPECT AFTER THE PROCEDURE After your procedure, it is typical to have the following:  Pain or soreness at the catheter insertion site.  Drowsiness for several hours after the procedure.  Some bruising at the catheter insertion site.  Drainage into the collection bag on the outside of your body, if you have an external drainage catheter. You might see bloody discharge in the bag for the first day or two. This should turn a yellow-green color soon afterward. HOME CARE INSTRUCTIONS   Do not use machinery, drive, or make legal decisions for 24 hours after your procedure.  Have someone drive you home.  Resume your usual diet. Avoid alcoholic beverages for 24 hours after your procedure.  Rest for the remainder of the day.  Only take over-the-counter or prescription medicines for pain, discomfort, or fever as directed by your health care provider. Do not take aspirin or blood thinners unless directed otherwise. This can make bleeding worse.  Clean the tube insertion site as directed by your health care provider.  Take showers, not baths. Avoid pools and hot tubs. Before showering, cover the area with plastic wrap and tape the edges of the plastic wrap to your skin. This is done to keep your skin dry.  Keep the skin around the insertion site dry. If the area gets wet, dry the skin completely.  Keep all follow-up appointments. SEEK  MEDICAL CARE IF:   Your pain gets worse and is not relieved with pain medicines after an initial improvement.  You have any questions about your tube.  Your skin breaks down around the tube.  You have a fever.  You have chills. SEEK IMMEDIATE MEDICAL CARE IF:   Your redness, soreness, or swelling at the tube insertion site gets worse despite good cleaning.  You have leakage of bile around the tube.  Your tube becomes blocked or clogged.  Your catheter is dislodged or comes out. This information is not intended to replace advice given to you by your health care provider. Make sure you discuss any questions you have with your health care provider. Document Released: 10/28/2003 Document Revised: 03/20/2013 Document Reviewed: 11/20/2012 Elsevier Interactive Patient Education  2017 [place]. Moderate Conscious Sedation, Adult, Care After These instructions provide you with information about caring for yourself after your procedure. Your health care provider may also give you more specific instructions. Your treatment has been planned according to current medical practices, but problems sometimes occur. Call your health care provider if you have any problems or questions after your procedure. What can I expect after the procedure? After your procedure, it is common:  To feel sleepy for several hours.  To feel clumsy and have poor balance for several hours.  To have poor judgment for several hours.  To vomit if you eat too soon. Follow these instructions at home: For at least 24 hours after the procedure:   Do not:  Participate in activities where you could fall or become injured.  Drive.  Use heavy machinery.  Drink alcohol.  Take sleeping pills or medicines that cause drowsiness.  Make important decisions or sign legal documents.  Take care of children on your own.  Rest. Eating and drinking  Follow the diet recommended by your health care provider.  If you  vomit:  Drink water, juice, or soup when you can drink without vomiting.  Make sure you have little or no nausea before eating solid foods. General instructions  Have a responsible adult stay with you until you are awake and alert.  Take over-the-counter and prescription medicines only as told by your health care provider.  If you smoke, do not smoke without supervision.  Keep all follow-up visits as told by your health care provider. This is important. Contact a health care provider if:  You keep feeling nauseous or you keep vomiting.  You feel light-headed.  You develop a rash.  You have a fever. Get help right away if:  You have trouble breathing. This information is not intended to replace advice given to you by your health care provider. Make sure you discuss any questions you have with your health care provider. Document Released: 01/03/2013 Document Revised: 08/18/2015 Document Reviewed: 07/05/2015 Elsevier Interactive Patient Education  2017 [employer].

## 2016-03-12 NOTE — H&P (Signed)
Chief Complaint: Patient was seen in consultation today for percutaneous transhepatic cholangiogram with drain placement at the request of Dr [name]  Referring Physician(s): Dr [name]  Supervising Physician: [name]  Patient Status: [facility] - Out-pt  History of Present Illness: [name] is a 70 y.o. male   Hx Metastatic [place] lung Ca Biliary obstruction first noted 07/2014 requiring PTC and drain placed in IR ERCP stent placement followed Has had plastic and metal stents throughout his illness. 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 Most recent metal stent placed 02/18/2016 - ERCP Pt still with rising Bilirubin labs; pruritis; and jaundice Consulted with Dr [name] 03/09/16  CT 03/10/16: IMPRESSION: 1. No acute findings to explain the patient's elevated bilirubin. Common bile duct stent is in place. 2. Cholelithiasis. 3. Aortic atherosclerosis (ICD10-170.0). Coronary artery calcification. 4. Ectatic abdominal aorta at risk for aneurysm development.  Consult note - Dr [name]: Assessment and Plan:  [name] is a 70 year old gentleman likely with recurrent obstruction of the common bile duct and the plastic stent that was placed 02/18/2016. The stricture is most likely related to radiotherapy for his treated metastatic lymphadenopathy. He has had a prior internal/external biliary drain, which I think may be indicated at this point. We are awaiting results of CT scan to confirm bile duct obstruction. He has an appointment tomorrow to see Dr. Earlie [name] regarding his therapy. I had a lengthy discussion regarding PTCA and drainage with the patient and his wife. Specific Risks and Benefits discussed with the patient including bleeding, infection, damage to adjacent structures, bowel perforation/fistula connection, sepsis, need for hospitalization, cardiopulmonary collapse, death.. All of the patient's questions were answered,  patient is agreeable to proceed. Consent signed and in chart. Plan: Await results of pending CT study. Schedule for PTC and drainage, with the indication of biliary obstruction (posttreatment common bile duct stricture).He does prefer Dr. Earleen [name], although Dr. Kathlene [name] and Dr. Pascal [name] have both been involved with his care.     Past Medical History:  Diagnosis Date  . Abnormality of gait 03/12/2015  . Acute meniscal tear of knee RIGHT KNEE  . Antineoplastic chemotherapy induced anemia 03/04/2016  . ARF (acute renal failure) ([place]) 03/2015   pre-renal patient was septic. is improved now.  . Arthritis RIGHT SHOULDER  . BPH (benign prostatic hypertrophy)   . Dizziness and giddiness 04/23/2014  . Gastrostomy tube dependent ([place])    Continous feedings per [name] tube"unable to swallow"- Infusion pump for Nutrition  . HOH (hard of hearing)    bilateral hearing aids  . Hypertension    no longer an issue. Now runs usually low- tekes med to increase blood pressure  . Immature cataract BILATERAL  . Non-small cell lung cancer ([place]) DX SEPT 2010  W/ CHEMORADIATION AT THAT TIME -- NOW  W/ METS--  CURRENTLY ON MAINTENANCE CHEMO TX  EVERY 28 DAYS   ONCOLOGIST- DR [name]- lung cancer with metastasis.  . Orthostatic hypotension    B/P tends to run 110/70 or less -using med to raise B/P.  Marland Kitchen Pneumonia   . Radiation 01/08/09-01/21/09   Mediastinum 30 Gy x 12 fractions  . Radiation 08/19/14-09/04/14   Palliative RT Porta hepatic LN 30 Gy  . Sepsis ([place]) 03/2015  . Shortness of breath dyspnea    with exertion  . Swallowing impairment    "Unable to swallow"  . Transfusion history    last 1-2 yrs ago with chemotherapy  . Tremor 04/23/2014   Right hand  Past Surgical History:  Procedure Laterality Date  . BILATERAL EAR DRUM SURGERY  1960'S  . BILE DUCT STENT PLACEMENT    . CATARACT EXTRACTION Bilateral   . ERCP N/A 08/09/2014   Procedure: ENDOSCOPIC RETROGRADE CHOLANGIOPANCREATOGRAPHY (ERCP);   Surgeon: Carol Ada, MD;  Location: [facility];  Service: Endoscopy;  Laterality: N/A;  . ERCP N/A 05/23/2015   Procedure: ENDOSCOPIC RETROGRADE CHOLANGIOPANCREATOGRAPHY (ERCP);  Surgeon: Carol Ada, MD;  Location: [facility] ENDOSCOPY;  Service: Endoscopy;  Laterality: N/A;  . ERCP N/A 05/30/2015   Procedure: ENDOSCOPIC RETROGRADE CHOLANGIOPANCREATOGRAPHY (ERCP);  Surgeon: Carol Ada, MD;  Location: [facility];  Service: Endoscopy;  Laterality: N/A;  . ERCP N/A 02/18/2016   Procedure: ENDOSCOPIC RETROGRADE CHOLANGIOPANCREATOGRAPHY (ERCP);  Surgeon: Carol Ada, MD;  Location: [facility];  Service: Endoscopy;  Laterality: N/A;  . ESOPHAGOGASTRODUODENOSCOPY (EGD) WITH PROPOFOL N/A 08/09/2014   Procedure: ESOPHAGOGASTRODUODENOSCOPY (EGD) WITH PROPOFOL;  Surgeon: Carol Ada, MD;  Location: [facility] ENDOSCOPY;  Service: Endoscopy;  Laterality: N/A;  . EUS N/A 08/09/2014   Procedure: UPPER ENDOSCOPIC ULTRASOUND (EUS) RADIAL;  Surgeon: Carol Ada, MD;  Location: [facility] ENDOSCOPY;  Service: Endoscopy;  Laterality: N/A;  . FINE NEEDLE ASPIRATION N/A 08/09/2014   Procedure: FINE NEEDLE ASPIRATION (FNA) LINEAR;  Surgeon: Carol Ada, MD;  Location: [facility] ENDOSCOPY;  Service: Endoscopy;  Laterality: N/A;  . GASTROSTOMY TUBE PLACEMENT  10/2014  . IR GENERIC HISTORICAL  03/02/2016   IR FLUORO RM 30-60 MIN 03/02/2016 Nathan Mckusick, DO [facility]-INTERV RAD  . KNEE ARTHROSCOPY  12/21/2011   Procedure: ARTHROSCOPY KNEE;  Surgeon: Tobi Bastos, MD;  Location: [facility];  Service: Orthopedics;  Laterality: Right;  WITH MEDIAL MENISECTOMY  . LARYNGOSCOPY  01/2015 and 04/21/15  . ORIF LEFT RING FINGER  AND REVASCULARIZATION OF RADIAL SIDE  02-16-2007   DEGLOVING INJURY  . [place] cath    . PORTACATH PLACEMENT  Removed 06/20/3015   as of 05-27-15 remains right chest. 02-16-16 Port has been removed now.  Marland Kitchen ROTATOR CUFF REPAIR  2008   LEFT SHOULDER  . SHOULDER OPEN ROTATOR CUFF REPAIR Right 06/05/2014   Procedure: RIGHT  ROTATOR CUFF REPAIR SHOULDER OPEN;  Surgeon: Latanya Maudlin, MD;  Location: [facility] ORS;  Service: Orthopedics;  Laterality: Right;  . TONSILLECTOMY     as child    Allergies: Augmentin [amoxicillin-pot clavulanate]; Fentanyl; Percocet [oxycodone-acetaminophen]; Scopolamine; Zolpidem tartrate; Carboplatin; and Levaquin [levofloxacin]  Medications: Prior to Admission medications   Medication Sig Start Date End Date Taking? Authorizing Provider  aspirin 325 MG tablet Take 650 mg by mouth daily as needed for mild pain.   Yes Historical Provider, MD  b complex vitamins tablet Take 1 tablet by mouth daily.   Yes Historical Provider, MD  dicyclomine (BENTYL) 20 MG tablet Take 20 mg by mouth every 6 (six) hours as needed (for abdominal cramps).   Yes Historical Provider, MD  diphenhydramine-acetaminophen (TYLENOL PM) 25-500 MG TABS tablet Take 1 tablet by mouth at bedtime as needed (for sleep).    Yes Historical Provider, MD  HYDROmorphone (DILAUDID) 2 MG tablet Take 1-2 tablets (2-4 mg total) by mouth every 4 (four) hours as needed for severe pain. 03/09/16  Yes Nathan Bears, MD  hydrOXYzine (ATARAX/VISTARIL) 25 MG tablet Take 25 mg by mouth 3 (three) times daily as needed for itching.   Yes Historical Provider, MD  LORazepam (ATIVAN) 1 MG tablet Take 0.5-1 tablets (0.5-1 mg total) by mouth every 4 (four) hours as needed for anxiety (and/or nausea). 03/05/16  Yes Nathan Bears,  MD  midodrine (PROAMATINE) 5 MG tablet Take 5 mg by mouth 3 (three) times daily with meals.   Yes Historical Provider, MD  Multiple Vitamin (MULTIVITAMIN WITH MINERALS) TABS tablet Take 1 tablet by mouth daily.   Yes Historical Provider, MD  mupirocin ointment (BACTROBAN) 2 % Apply 1 application topically 3 (three) times daily as needed. Pt applies around tube area.   Yes Historical Provider, MD  Nutritional Supplements (FEEDING SUPPLEMENT, OSMOLITE 1.5 CAL,) LIQD Place 237 mLs into feeding tube 3 (three) times daily.    Yes  Historical Provider, MD  Nutritional Supplements (TWOCAL HN) LIQD Place 237 mLs into feeding tube 3 (three) times daily.   Yes Historical Provider, MD  omeprazole (PRILOSEC) 40 MG capsule Take 40 mg by mouth daily as needed.    Yes Historical Provider, MD  ondansetron (ZOFRAN) 8 MG tablet Take 8 mg by mouth every 8 (eight) hours as needed. 02/25/16  Yes Historical Provider, MD  promethazine (PHENERGAN) 25 MG tablet Take 25 mg by mouth every 6 (six) hours as needed for nausea or vomiting.   Yes Historical Provider, MD  senna (SENOKOT) 8.6 MG tablet Take 1 tablet by mouth daily as needed for constipation.    Yes Historical Provider, MD  vitamin C (ASCORBIC ACID) 500 MG tablet Take 500 mg by mouth daily.   Yes Historical Provider, MD     Family History  Problem Relation Age of Onset  . Congestive Heart Failure Mother   . Cancer Sister   . Cancer Maternal Grandfather   . Colon cancer Father   . Heart attack Brother   . Colon cancer    . Heart attack    . Cancer    . Hypertension    . Hyperlipidemia      Social History   Social History  . Marital status: Married    Spouse name: N/A  . Number of children: 3  . Years of education: college   Occupational History  . 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 Smoking status: Former Smoker    Packs/day: 2.00    Years: 20.00    Types: Cigarettes    Quit date: 03/17/1981  . Smokeless tobacco: Never Used  . Alcohol use 4.8 oz/week    1 Cans of beer, 7 Standard drinks or equivalent per week     Comment: beer occ. monthly  . Drug use: No  . Sexual activity: Not Currently   Other Topics Concern  . None   Social History Narrative   Lives with his wife, [name]   Patient is right handed.   Patient drinks 1-2 cups caffeine daily.    Review of Systems: A 12 point ROS discussed and pertinent positives are indicated in the HPI above.  All other systems are negative.  Review of Systems    Constitutional: Positive for activity change, appetite change, fatigue and unexpected weight change. Negative for fever.  Respiratory: Negative for shortness of breath.   Cardiovascular: Negative for chest pain.  Gastrointestinal: Positive for abdominal pain and nausea.  Neurological: Positive for weakness.  Psychiatric/Behavioral: Negative for behavioral problems and confusion.    Vital Signs: BP 136/76   Pulse 84   Temp 97.5 F (36.4 C)   Resp 20   Ht '5\' 8"'$  (1.727 m)   Wt 137 lb (62.1 kg)   SpO2 100%   BMI 20.83 kg/m   Physical Exam  Constitutional: He is oriented to  person, place, and time.  Eyes: Scleral icterus is present.  Cardiovascular: Normal rate and regular rhythm.   Pulmonary/Chest: Effort normal and breath sounds normal.  Abdominal: Soft. There is tenderness.  Musculoskeletal: Normal range of motion.  Neurological: He is alert and oriented to person, place, and time.  Skin: Skin is warm and dry.  jaundice  Psychiatric: He has a normal mood and affect. His behavior is normal. Judgment and thought content normal.  Nursing note and vitals reviewed.   Mallampati Score:  MD Evaluation Airway: WNL Heart: WNL Abdomen: WNL Chest/ Lungs: WNL ASA  Classification: 3 Mallampati/Airway Score: One  Imaging: Ir Fluoro Rm 30-60 Min  Result Date: 03/02/2016 INDICATION: 70 year old male with a history of percutaneous gastrostomy tube placement with a retained T tack on CT dated 01/20/2016. EXAM: IR FLOURO RM 0-60 MIN COMPARISON:  CT 01/20/2016 MEDICATIONS: None ANESTHESIA/SEDATION: None FLUOROSCOPY TIME:  Fluoroscopy Time: 0 minutes 42 seconds (6 mGy). COMPLICATIONS: None TECHNIQUE: Informed written consent was obtained from the patient after a thorough discussion of the procedural risks, benefits and alternatives. All questions were addressed. Maximal Sterile Barrier Technique was utilized including caps, mask, sterile gowns, sterile gloves, sterile drape, hand hygiene  and skin antiseptic. A timeout was performed prior to the initiation of the procedure. Patient is position in the supine position on the fluoroscopy table. The region surrounding G-tube was prepped and draped in the usual sterile fashion. The skin and subcutaneous tissues at the ostomy site were generously infiltrated 1% lidocaine for local anesthesia. Fluoroscopic survey was then performed at the site of the ostomy, searching for the retained foreign body (retained T tack from prior gastrostomy). This was not present at the ostomy site, and so the rest of the abdomen was surveyed. No evidence of the T tack on the fluoroscopic survey. At this time, the granulation tissue at the ostomy site was treated with silver nitrate. Dressing was placed. Patient tolerated the procedure well and remained hemodynamically stable throughout. No complications were encountered and no significant blood loss encountered. FINDINGS: Fluoroscopic survey demonstrates no evidence of retained T tack, which seems to have passed through the GI system. Silver nitrate treatment of granulation tissue. IMPRESSION: Status post fluoroscopic survey of the abdomen demonstrating no evidence of retained foreign body on the current study, which seems to have passed through the GI tract. Local treatment of granulation tissue at the ostomy site with silver nitrate. Should this recur, topical treatment with repeat silver nitrate or alternatively a prescription for Kenalog cream, either 0.25% or 1% may be considered. Signed, Dulcy Fanny. Nathan Newport, DO [facility] Electronically Signed   By: [name] D.O.   On: 03/02/2016 16:56   Ct Abdomen W Wo Contrast  Result Date: 03/10/2016 CLINICAL DATA:  Non-small cell lung cancer, metastatic, treated with radiotherapy and chemotherapy. Elevated bilirubin. EXAM: CT ABDOMEN WITHOUT AND WITH CONTRAST TECHNIQUE: Multidetector CT imaging of the abdomen was  performed following the standard protocol before and following the bolus administration of intravenous contrast. CONTRAST:  113m ISOVUE-300 IOPAMIDOL (ISOVUE-300) INJECTION 61% COMPARISON:  01/20/2016. FINDINGS: Lower chest: Lung bases show volume loss in the right lower lobe with associated pleural thickening. Heart size normal. Coronary artery calcification. No pericardial or pleural effusion. Hepatobiliary: Mild pneumobilia with a common bile duct stent in place. Liver is otherwise unremarkable. Stones and air are seen in the gallbladder. No biliary ductal dilatation. Pancreas: Negative. Spleen: Negative. Adrenals/Urinary Tract: Adrenal glands and right kidney are unremarkable. A 1.5  cm cyst is seen in the left kidney. Kidneys are otherwise unremarkable. Stomach/Bowel: Percutaneous gastrostomy. Stomach and visualized portions of the small bowel and colon are otherwise unremarkable. Vascular/Lymphatic: Atherosclerotic calcification of the arterial vasculature. Infrarenal aorta measures up to 2.7 cm. No pathologically enlarged lymph nodes. Other: No free fluid. Mesenteries and peritoneum are unremarkable. Fluid density structure adjacent to the caudate lobe is unchanged. Musculoskeletal: No worrisome lytic or sclerotic lesions. IMPRESSION: 1. No acute findings to explain the patient's elevated bilirubin. Common bile duct stent is in place. 2. Cholelithiasis. 3. Aortic atherosclerosis (ICD10-170.0). Coronary artery calcification. 4. Ectatic abdominal aorta at risk for aneurysm development. Recommend followup by ultrasound in 5 years. This recommendation follows ACR consensus guidelines: White Paper of the ACR Incidental Findings Committee II on Vascular Findings. J Am Coll Radiol 2013; 10:789-794. Electronically Signed   By: Lorin Picket M.D.   On: 03/10/2016 13:50   Dg Ercp  Result Date: 02/18/2016 CLINICAL DATA:  70 year old male with prior CBD stent EXAM: ERCP TECHNIQUE: Multiple spot images obtained  with the fluoroscopic device and submitted for interpretation post-procedure. FLUOROSCOPY TIME:  3 minutes 7 seconds COMPARISON:  CT 01/20/2016 FINDINGS: Limited fluoroscopic images during ERCP. Initial image demonstrates endoscope within the upper abdomen with cannulation of the ampulla. Partial opacification of the extrahepatic biliary system. Final image demonstrates stent exchange, with placement of a plastic stent into the common bile duct. IMPRESSION: Limited images during ERCP demonstrate evidence of CBD stent exchange with placement of a plastic biliary stent. Please refer to the dictated operative report for full details of intraoperative findings and procedure. Signed, Dulcy Fanny. Nathan Newport, DO [name] Electronically Signed   By: [name] D.O.   On: 02/18/2016 14:00    Labs:  CBC:  Recent Labs  01/22/16 1010 02/12/16 1115 03/04/16 1020 03/10/16 1351  WBC 7.5 5.9 8.2 8.5  HGB 13.1 12.6* 11.8* 12.0*  HCT 37.8* 37.6* 35.2* 35.9*  PLT 124* 115* 130* 132*    COAGS:  Recent Labs  06/20/15 0735 09/08/15 0901  INR 1.10 1.22  APTT 30 25    BMP:  Recent Labs  05/23/15 0813 06/20/15 0735  10/21/15 0922 10/26/15 1715  01/22/16 1010 02/12/16 1115 03/04/16 1020 03/10/16 1351  NA 138 139  < > 136 135  < > 138 139 139 136  K 4.5 4.7  < > 4.1 4.3  < > 4.8 4.6 4.3 4.2  CL 102 102  --  107 104  --   --   --   --   --   CO2 23 26  < > 21* 21*  < > 21* 19* 22 22  GLUCOSE 109* 117*  < > 138* 108*  < > 103 120 117 101  BUN 28* 31*  < > 33* 34*  < > 34.3* 33.7* 31.8* 31.8*  CALCIUM 10.2 10.5*  < > 9.0 9.4  < > 9.5 9.4 9.3 9.3  CREATININE 0.96 1.13  < > 1.06 1.19  < > 1.2 1.1 1.2 1.1  GFRNONAA >60 >60  --  >60 >60  --   --   --   --   --   GFRAA >60 >60  --  >60 >60  --   --   --   --   --   < > = values in this interval not displayed.  LIVER FUNCTION TESTS:  Recent Labs  01/22/16 1010 02/12/16 1115  03/04/16 1020 03/10/16 1351  BILITOT 3.04* 5.12* 8.14 Repeated and Verified* 8.17*  AST 84* 85* 123* 105*  ALT 121* 119* 136* 121*  ALKPHOS 450* 456* 408* 388*  PROT 7.0 6.5 6.8 6.6  ALBUMIN 3.3* 3.1* 3.0* 3.1*    TUMOR MARKERS: No results for input(s): AFPTM, CEA, CA199, CHROMGRNA in the last 8760 hours.  Assessment and Plan:  Metastatic [place] Lung Ca Biliary obstruction Rising bilirubin Scheduled for percutaneous transhepatic cholangiogram with drain placement Risks and Benefits discussed with the patient including bleeding, infection, damage to adjacent structures,  and sepsis. All of the patient's questions were answered, patient is agreeable to proceed. Consent signed and in chart.   Thank you for this interesting consult.  I greatly enjoyed meeting [name] and look forward to participating in their care.  A copy of this report was sent to the requesting provider on this date.  Electronically Signed: Monia Sabal A 03/12/2016, 12:34 PM   I spent a total of  30 Minutes   in face to face in clinical consultation, greater than 50% of which was counseling/coordinating care for [name] with biliary drain placement

## 2016-03-12 NOTE — Progress Notes (Signed)
Dr [name] notified of client c/o abdominal pain 9/10 and order noted

## 2016-03-12 NOTE — Sedation Documentation (Signed)
remedicated for pain, grimacing

## 2016-03-12 NOTE — Progress Notes (Signed)
Dr Kathlene Cote and Dr [name] notified client more awake now and states pain has decreased to 5/10 and states feels like going home and ok to D/C home per Dr Kathlene Cote and Dr [name] and per Dr Kathlene Cote client's wife instructed to flush biliary catheter once a day and cap and client's wife voiced understanding

## 2016-03-12 NOTE — Sedation Documentation (Signed)
Medicated for pain, pt grimacing

## 2016-03-12 NOTE — Procedures (Signed)
Interventional Radiology Procedure Note  Procedure:  PTC with int/ext biliary drain.  76F drain.    Findings:  Plastic drain is patent and operable.  New 76F int/ext drain into the duodenum.    Complications: None Recommendations:  - OK to cap tube.  Home with spare bag for incidental.  - 2 hour obs.  DC at 6:20pm. - Follow up with physicians as scheduled - Will plan on exchanging the int/ext drain in 2-3 months at [facility] or [facility].  Attempt for Foreign body retrieval at that time to attempt to remove the biliary plastic stent.   - Routine care   Signed,  Dulcy Fanny. [name], DO

## 2016-03-15 ENCOUNTER — Telehealth: Payer: Self-pay | Admitting: Medical Oncology

## 2016-03-15 ENCOUNTER — Telehealth: Payer: Self-pay | Admitting: Radiology

## 2016-03-15 NOTE — Telephone Encounter (Signed)
OK 

## 2016-03-15 NOTE — Telephone Encounter (Signed)
Pt has new biliary drain [name] and [place]) . They asked wife to request ov appt/[facility]   after labs on wed - he had some issues over the weekend with diarrhea ( better now )  and [place] would like for him to be seen wed to see how he is doing.

## 2016-03-15 NOTE — Progress Notes (Signed)
  Hx Metastatic [place] lung Ca Biliary obstruction first noted 07/2014 requiring PTC and drain placed in IR ERCP stent placement followed Has had plastic and metal stents throughout his illness. 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 Most recent metal stent placed 02/18/2016 - ERCP Pt still with rising Bilirubin labs; pruritis; and jaundice Consulted with Dr Rolla Plate 03/09/16  Schedule for PTC and drainage, with the indication of biliary obstruction (posttreatment common bile duct stricture  03/12/16: Status post percutaneous transhepatic cholangiogram with placement of internal/ external biliary drain with a modified 8 [place] biliary Drain.  Has called and talked with Dr Kathlene Cote this last weekend. Pt was having a good amt of pain with drain Nausea but no vomit  Plan was to place bag to gravity drainage instead of capped.  Wife has done so and pt is some better. Still with Nausea Pain is less No fever. Site is clean and dry. Output is great.  Plan to see [facility] Wed 12/20.  Asked pts wife to have pt see MD while at [facility] for evaluation and just check in .  She is agreeable to plan

## 2016-03-16 ENCOUNTER — Telehealth: Payer: Self-pay

## 2016-03-16 ENCOUNTER — Ambulatory Visit (HOSPITAL_BASED_OUTPATIENT_CLINIC_OR_DEPARTMENT_OTHER): Payer: Medicare Other | Admitting: Nurse Practitioner

## 2016-03-16 ENCOUNTER — Ambulatory Visit (HOSPITAL_BASED_OUTPATIENT_CLINIC_OR_DEPARTMENT_OTHER): Payer: Medicare Other

## 2016-03-16 ENCOUNTER — Other Ambulatory Visit: Payer: Self-pay | Admitting: Radiology

## 2016-03-16 VITALS — BP 112/78 | HR 88 | Temp 98.1°F | Resp 17 | Ht 68.0 in

## 2016-03-16 DIAGNOSIS — C3492 Malignant neoplasm of unspecified part of left bronchus or lung: Secondary | ICD-10-CM

## 2016-03-16 DIAGNOSIS — E46 Unspecified protein-calorie malnutrition: Secondary | ICD-10-CM

## 2016-03-16 DIAGNOSIS — R770 Abnormality of albumin: Secondary | ICD-10-CM

## 2016-03-16 DIAGNOSIS — C801 Malignant (primary) neoplasm, unspecified: Secondary | ICD-10-CM

## 2016-03-16 DIAGNOSIS — E43 Unspecified severe protein-calorie malnutrition: Secondary | ICD-10-CM

## 2016-03-16 DIAGNOSIS — K831 Obstruction of bile duct: Secondary | ICD-10-CM

## 2016-03-16 DIAGNOSIS — R5382 Chronic fatigue, unspecified: Secondary | ICD-10-CM

## 2016-03-16 DIAGNOSIS — R740 Nonspecific elevation of levels of transaminase and lactic acid dehydrogenase [LDH]: Secondary | ICD-10-CM

## 2016-03-16 DIAGNOSIS — R7401 Elevation of levels of liver transaminase levels: Secondary | ICD-10-CM

## 2016-03-16 DIAGNOSIS — R197 Diarrhea, unspecified: Secondary | ICD-10-CM | POA: Diagnosis not present

## 2016-03-16 DIAGNOSIS — E860 Dehydration: Secondary | ICD-10-CM

## 2016-03-16 DIAGNOSIS — Z5112 Encounter for antineoplastic immunotherapy: Secondary | ICD-10-CM

## 2016-03-16 LAB — CBC WITH DIFFERENTIAL/PLATELET
BASO%: 0.5 % (ref 0.0–2.0)
BASOS ABS: 0 10*3/uL (ref 0.0–0.1)
EOS ABS: 0.1 10*3/uL (ref 0.0–0.5)
EOS%: 2 % (ref 0.0–7.0)
HCT: 34.8 % — ABNORMAL LOW (ref 38.4–49.9)
HEMOGLOBIN: 11.6 g/dL — AB (ref 13.0–17.1)
LYMPH%: 8 % — AB (ref 14.0–49.0)
MCH: 35.4 pg — AB (ref 27.2–33.4)
MCHC: 33.4 g/dL (ref 32.0–36.0)
MCV: 105.9 fL — AB (ref 79.3–98.0)
MONO#: 0.8 10*3/uL (ref 0.1–0.9)
MONO%: 10.6 % (ref 0.0–14.0)
NEUT#: 5.7 10*3/uL (ref 1.5–6.5)
NEUT%: 78.9 % — ABNORMAL HIGH (ref 39.0–75.0)
Platelets: 134 10*3/uL — ABNORMAL LOW (ref 140–400)
RBC: 3.29 10*6/uL — AB (ref 4.20–5.82)
RDW: 14.4 % (ref 11.0–14.6)
WBC: 7.2 10*3/uL (ref 4.0–10.3)
lymph#: 0.6 10*3/uL — ABNORMAL LOW (ref 0.9–3.3)

## 2016-03-16 LAB — COMPREHENSIVE METABOLIC PANEL
ALBUMIN: 2.5 g/dL — AB (ref 3.5–5.0)
ALK PHOS: 239 U/L — AB (ref 40–150)
ALT: 96 U/L — ABNORMAL HIGH (ref 0–55)
ANION GAP: 7 meq/L (ref 3–11)
AST: 67 U/L — ABNORMAL HIGH (ref 5–34)
BUN: 30.5 mg/dL — ABNORMAL HIGH (ref 7.0–26.0)
CO2: 22 mEq/L (ref 22–29)
Calcium: 9.5 mg/dL (ref 8.4–10.4)
Chloride: 109 mEq/L (ref 98–109)
Creatinine: 1.1 mg/dL (ref 0.7–1.3)
EGFR: 67 mL/min/{1.73_m2} — AB (ref >90)
GLUCOSE: 128 mg/dL (ref 70–140)
POTASSIUM: 4.8 meq/L (ref 3.5–5.1)
SODIUM: 138 meq/L (ref 136–145)
Total Bilirubin: 8.13 mg/dL (ref 0.20–1.20)
Total Protein: 6.6 g/dL (ref 6.4–8.3)

## 2016-03-16 MED ORDER — SODIUM CHLORIDE 0.9 % IV SOLN: 8.0000 mg | Freq: Once | INTRAVENOUS | Status: DC

## 2016-03-16 MED ORDER — MORPHINE SULFATE 15 MG PO TABS: 15.0000 mg | ORAL_TABLET | ORAL | 0 refills | Status: DC | PRN

## 2016-03-16 MED ORDER — SODIUM CHLORIDE 0.9 % IV SOLN
Freq: Once | INTRAVENOUS | Status: AC
  Administered 2016-03-16: 13:00:00 via INTRAVENOUS

## 2016-03-16 MED ORDER — MORPHINE SULFATE (PF) 4 MG/ML IV SOLN
INTRAVENOUS | Status: AC
  Filled 2016-03-16: qty 1

## 2016-03-16 MED ORDER — CEPHALEXIN 500 MG PO CAPS: 500.0000 mg | ORAL_CAPSULE | Freq: Four times a day (QID) | ORAL | 0 refills | Status: DC

## 2016-03-16 MED ORDER — ONDANSETRON HCL 4 MG/2ML IJ SOLN
INTRAMUSCULAR | Status: AC
  Filled 2016-03-16: qty 4

## 2016-03-16 MED ORDER — ONDANSETRON HCL 4 MG/2ML IJ SOLN
8.0000 mg | Freq: Once | INTRAMUSCULAR | Status: AC
  Administered 2016-03-16: 8 mg via INTRAVENOUS

## 2016-03-16 MED ORDER — DIPHENOXYLATE-ATROPINE 2.5-0.025 MG PO TABS: 2.0000 | ORAL_TABLET | Freq: Four times a day (QID) | ORAL | 0 refills | Status: DC | PRN

## 2016-03-16 MED ORDER — MORPHINE SULFATE 4 MG/ML IJ SOLN
2.0000 mg | Freq: Once | INTRAMUSCULAR | Status: AC
  Administered 2016-03-16: 2 mg via INTRAVENOUS
  Filled 2016-03-16: qty 1

## 2016-03-16 NOTE — Telephone Encounter (Signed)
Wife called stating pt has had diarrhea x24 hours and cannot get it under control. He had biliary drains placed Friday.  Wanting to be seen today with possible fluids rather than tomorrow. He has lab and sx management tomorrow.  sw Dr [name] and called wife back. Will move appt to today. Pt will arrive about 11 am. inbasket sent.

## 2016-03-16 NOTE — Patient Instructions (Signed)
Dehydration, Adult Dehydration is a condition in which there is not enough fluid or water in the body. This happens when you lose more fluids than you take in. Important organs, such as the kidneys, brain, and heart, cannot function without a proper amount of fluids. Any loss of fluids from the body can lead to dehydration. Dehydration can range from mild to severe. This condition should be treated right away to prevent it from becoming severe. What are the causes? This condition may be caused by:  Vomiting.  Diarrhea.  Excessive sweating, such as from heat exposure or exercise.  Not drinking enough fluid, especially:  When ill.  While doing activity that requires a lot of energy.  Excessive urination.  Fever.  Infection.  Certain medicines, such as medicines that cause the body to lose excess fluid (diuretics).  Inability to access safe drinking water.  Reduced physical ability to get adequate water and food. What increases the risk? This condition is more likely to develop in people:  Who have a poorly controlled long-term (chronic) illness, such as diabetes, heart disease, or kidney disease.  Who are age 65 or older.  Who are disabled.  Who live in a place with high altitude.  Who play endurance sports. What are the signs or symptoms? Symptoms of mild dehydration may include:   Thirst.  Dry lips.  Slightly dry mouth.  Dry, warm skin.  Dizziness. Symptoms of moderate dehydration may include:   Very dry mouth.  Muscle cramps.  Dark urine. Urine may be the color of tea.  Decreased urine production.  Decreased tear production.  Heartbeat that is irregular or faster than normal (palpitations).  Headache.  Light-headedness, especially when you stand up from a sitting position.  Fainting (syncope). Symptoms of severe dehydration may include:   Changes in skin, such as:  Cold and clammy skin.  Blotchy (mottled) or pale skin.  Skin that does  not quickly return to normal after being lightly pinched and released (poor skin turgor).  Changes in body fluids, such as:  Extreme thirst.  No tear production.  Inability to sweat when body temperature is high, such as in hot weather.  Very little urine production.  Changes in vital signs, such as:  Weak pulse.  Pulse that is more than 100 beats a minute when sitting still.  Rapid breathing.  Low blood pressure.  Other changes, such as:  Sunken eyes.  Cold hands and feet.  Confusion.  Lack of energy (lethargy).  Difficulty waking up from sleep.  Short-term weight loss.  Unconsciousness. How is this diagnosed? This condition is diagnosed based on your symptoms and a physical exam. Blood and urine tests may be done to help confirm the diagnosis. How is this treated? Treatment for this condition depends on the severity. Mild or moderate dehydration can often be treated at home. Treatment should be started right away. Do not wait until dehydration becomes severe. Severe dehydration is an emergency and it needs to be treated in a hospital. Treatment for mild dehydration may include:   Drinking more fluids.  Replacing salts and minerals in your blood (electrolytes) that you may have lost. Treatment for moderate dehydration may include:   Drinking an oral rehydration solution (ORS). This is a drink that helps you replace fluids and electrolytes (rehydrate). It can be found at pharmacies and retail stores. Treatment for severe dehydration may include:   Receiving fluids through an IV tube.  Receiving an electrolyte solution through a feeding tube that is   passed through your nose and into your stomach (nasogastric tube, or NG tube).  Correcting any abnormalities in electrolytes.  Treating the underlying cause of dehydration. Follow these instructions at home:  If directed by your health care provider, drink an ORS:  Make an ORS by following instructions on the  package.  Start by drinking small amounts, about  cup (120 mL) every 5-10 minutes.  Slowly increase how much you drink until you have taken the amount recommended by your health care provider.  Drink enough clear fluid to keep your urine clear or pale yellow. If you were told to drink an ORS, finish the ORS first, then start slowly drinking other clear fluids. Drink fluids such as:  Water. Do not drink only water. Doing that can lead to having too little salt (sodium) in the body (hyponatremia).  Ice chips.  Fruit juice that you have added water to (diluted fruit juice).  Low-calorie sports drinks.  Avoid:  Alcohol.  Drinks that contain a lot of sugar. These include high-calorie sports drinks, fruit juice that is not diluted, and soda.  Caffeine.  Foods that are greasy or contain a lot of fat or sugar.  Take over-the-counter and prescription medicines only as told by your health care provider.  Do not take sodium tablets. This can lead to having too much sodium in the body (hypernatremia).  Eat foods that contain a healthy balance of electrolytes, such as bananas, oranges, potatoes, tomatoes, and spinach.  Keep all follow-up visits as told by your health care provider. This is important. Contact a health care provider if:  You have abdominal pain that:  Gets worse.  Stays in one area (localizes).  You have a rash.  You have a stiff neck.  You are more irritable than usual.  You are sleepier or more difficult to wake up than usual.  You feel weak or dizzy.  You feel very thirsty.  You have urinated only a small amount of very dark urine over 6-8 hours. Get help right away if:  You have symptoms of severe dehydration.  You cannot drink fluids without vomiting.  Your symptoms get worse with treatment.  You have a fever.  You have a severe headache.  You have vomiting or diarrhea that:  Gets worse.  Does not go away.  You have blood or green matter  (bile) in your vomit.  You have blood in your stool. This may cause stool to look black and tarry.  You have not urinated in 6-8 hours.  You faint.  Your heart rate while sitting still is over 100 beats a minute.  You have trouble breathing. This information is not intended to replace advice given to you by your health care provider. Make sure you discuss any questions you have with your health care provider. Document Released: 03/15/2005 Document Revised: 10/10/2015 Document Reviewed: 05/09/2015 Elsevier Interactive Patient Education  2017 [employer]  

## 2016-03-17 ENCOUNTER — Encounter: Payer: Medicare Other | Admitting: Nurse Practitioner

## 2016-03-17 ENCOUNTER — Encounter: Payer: Self-pay | Admitting: Interventional Radiology

## 2016-03-17 ENCOUNTER — Other Ambulatory Visit: Payer: Medicare Other

## 2016-03-17 ENCOUNTER — Telehealth: Payer: Self-pay | Admitting: *Deleted

## 2016-03-17 NOTE — Telephone Encounter (Signed)
TC from pt's wife requesting an appt on Friday, 03/19/16 for additional IV fluids. LOS sent for IV Fluids in [facility] @ 10 am. Also advised wife that a new home health referral was made yesterday for managing his external biliary stent.

## 2016-03-18 ENCOUNTER — Ambulatory Visit: Payer: Medicare Other | Admitting: Nurse Practitioner

## 2016-03-18 ENCOUNTER — Encounter: Payer: Self-pay | Admitting: Nurse Practitioner

## 2016-03-18 ENCOUNTER — Other Ambulatory Visit: Payer: Self-pay | Admitting: Nurse Practitioner

## 2016-03-18 ENCOUNTER — Telehealth: Payer: Self-pay | Admitting: Nurse Practitioner

## 2016-03-18 DIAGNOSIS — R197 Diarrhea, unspecified: Secondary | ICD-10-CM | POA: Insufficient documentation

## 2016-03-18 DIAGNOSIS — E860 Dehydration: Secondary | ICD-10-CM

## 2016-03-18 DIAGNOSIS — C3492 Malignant neoplasm of unspecified part of left bronchus or lung: Secondary | ICD-10-CM

## 2016-03-18 NOTE — Assessment & Plan Note (Signed)
Patient states he's been experiencing frequent diarrhea for the past few days.  Has only occasionally try to Imodium.  Patient appears dehydrated today.  He was advised to take Imodium over-the-counter and to alternate with Lomotil. Lomotil prescription was given to the patient today.  He will receive IV fluid rehydration today.

## 2016-03-18 NOTE — Assessment & Plan Note (Signed)
Patient received his last Keytruda on 01/22/2016.  All further immunotherapy has been held secondary to his increased bilirubin and biliary drain procedures.  Patient has plans to return for IV fluid rehydration on Friday, 03/19/2016.  Will also add labs and a symptom management clinic visit for next Wednesday, 03/24/2016.  He is scheduled for labs, follow up visit, and his next cycle of immunotherapy on 04/01/2016.

## 2016-03-18 NOTE — Telephone Encounter (Signed)
sw pt to confirm Dec appt date/times per LOS °

## 2016-03-18 NOTE — Assessment & Plan Note (Signed)
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  Patient now has both an internal biliary drain and an external drain that his wife has been managing.  Wife states that she flushes the external drain once daily.  She is requesting assistance in managing the external drain at home.  She also states that she needs some additional saline syringes as well.  She states that she has been in touch with the interventional radiology physicians regarding the patient's stent over this past weekend.  Patient has been suffering with chronic diarrhea for the past several days; and also has some nausea.  He states he's had no vomiting.  He also denies any recent fevers or chills.  He does complain of some mild tenderness surrounding the right upper quadrant biliary stent area.  Exam today reveals abdomen soft with bowel sounds positive in all 4 quadrants.  There is some tenderness surrounding the external biliary drain site.  There is also some mild erythema and trace edema.  Dr. [name] in to examine patient briefly as well; and he recommended that patient initiated Keflex antibiotics in case there is any mild infection secondary to the site.  Labs obtained today revealed that bilirubin remains essentially the same.  Bilirubin has only slightly improved from 8.17, down to 8.13.  Patient remains jaundiced.  This provider will order home health to manage the biliary drain and to provide saline flushes in the future.

## 2016-03-18 NOTE — Assessment & Plan Note (Signed)
Patient's liver enzymes have slightly improved.  AST has improved from 105 down to 67; and ALT has increased from 121 down to 96.  Patient underwent a revision of his biliary stent this past Friday, 03/12/2016; and continues to appear fairly jaundiced.  See further notes for details.

## 2016-03-18 NOTE — Assessment & Plan Note (Signed)
Patient continues to appear fairly fatigued, weak, and dehydrated.  He [facility] fluid [facility] today.  He has plans to return this coming Friday, 03/19/2016 for additional IV fluid rehydration.

## 2016-03-18 NOTE — Assessment & Plan Note (Signed)
Albumin has decreased from 3.1 down to 2.5.  Patient was encouraged to push protein in his diet is much as possible.

## 2016-03-18 NOTE — Progress Notes (Signed)
SYMPTOM MANAGEMENT CLINIC    Chief Complaint: Diarrhea, dehydration  HPI:  [name] 70 y.o. male diagnosed with lung cancer.  Currently undergoing Keytruda immunotherapy.    No history exists.    Review of Systems  Constitutional: Positive for malaise/fatigue and weight loss.  Gastrointestinal: Positive for diarrhea and nausea.  Neurological: Positive for weakness.  All other systems reviewed and are negative.   Past Medical History:  Diagnosis Date  . Abnormality of gait 03/12/2015  . Acute meniscal tear of knee RIGHT KNEE  . Antineoplastic chemotherapy induced anemia 03/04/2016  . ARF (acute renal failure) ([place]) 03/2015   pre-renal patient was septic. is improved now.  . Arthritis RIGHT SHOULDER  . BPH (benign prostatic hypertrophy)   . Dizziness and giddiness 04/23/2014  . Gastrostomy tube dependent ([place])    Continous feedings per Gordy Councilman tube"unable to swallow"- Infusion pump for Nutrition  . HOH (hard of hearing)    bilateral hearing aids  . Hypertension    no longer an issue. Now runs usually low- tekes med to increase blood pressure  . Immature cataract BILATERAL  . Non-small cell lung cancer ([place]) DX SEPT 2010  W/ CHEMORADIATION AT THAT TIME -- NOW  W/ METS--  CURRENTLY ON MAINTENANCE CHEMO TX  EVERY 28 DAYS   ONCOLOGIST- [facility]- lung cancer with metastasis.  . Orthostatic hypotension    B/P tends to run 110/70 or less -using med to raise B/P.  Marland Kitchen Pneumonia   . Radiation 01/08/09-01/21/09   Mediastinum 30 Gy x 12 fractions  . Radiation 08/19/14-09/04/14   Palliative RT Porta hepatic LN 30 Gy  . Sepsis ([place]) 03/2015  . Shortness of breath dyspnea    with exertion  . Swallowing impairment    "Unable to swallow"  . Transfusion history    last 1-2 yrs ago with chemotherapy  . Tremor 04/23/2014   Right hand    Past Surgical History:  Procedure Laterality Date  . BILATERAL EAR DRUM SURGERY  1960'S  . BILE DUCT STENT PLACEMENT    . CATARACT  EXTRACTION Bilateral   . ERCP N/A 08/09/2014   Procedure: ENDOSCOPIC RETROGRADE CHOLANGIOPANCREATOGRAPHY (ERCP);  Surgeon: Carol Ada, MD;  Location: [facility];  Service: Endoscopy;  Laterality: N/A;  . ERCP N/A 05/23/2015   Procedure: ENDOSCOPIC RETROGRADE CHOLANGIOPANCREATOGRAPHY (ERCP);  Surgeon: Carol Ada, MD;  Location: [facility] ENDOSCOPY;  Service: Endoscopy;  Laterality: N/A;  . ERCP N/A 05/30/2015   Procedure: ENDOSCOPIC RETROGRADE CHOLANGIOPANCREATOGRAPHY (ERCP);  Surgeon: Carol Ada, MD;  Location: [facility];  Service: Endoscopy;  Laterality: N/A;  . ERCP N/A 02/18/2016   Procedure: ENDOSCOPIC RETROGRADE CHOLANGIOPANCREATOGRAPHY (ERCP);  Surgeon: Carol Ada, MD;  Location: [facility];  Service: Endoscopy;  Laterality: N/A;  . ESOPHAGOGASTRODUODENOSCOPY (EGD) WITH PROPOFOL N/A 08/09/2014   Procedure: ESOPHAGOGASTRODUODENOSCOPY (EGD) WITH PROPOFOL;  Surgeon: Carol Ada, MD;  Location: [facility] ENDOSCOPY;  Service: Endoscopy;  Laterality: N/A;  . EUS N/A 08/09/2014   Procedure: UPPER ENDOSCOPIC ULTRASOUND (EUS) RADIAL;  Surgeon: Carol Ada, MD;  Location: [facility] ENDOSCOPY;  Service: Endoscopy;  Laterality: N/A;  . FINE NEEDLE ASPIRATION N/A 08/09/2014   Procedure: FINE NEEDLE ASPIRATION (FNA) LINEAR;  Surgeon: Carol Ada, MD;  Location: [facility] ENDOSCOPY;  Service: Endoscopy;  Laterality: N/A;  . GASTROSTOMY TUBE PLACEMENT  10/2014  . IR GENERIC HISTORICAL  03/02/2016   IR FLUORO RM 30-60 MIN 03/02/2016 Corrie Mckusick, DO [facility]-INTERV RAD  . IR GENERIC HISTORICAL  03/12/2016   IR INT EXT BILIARY DRAIN WITH CHOLANGIOGRAM 03/12/2016 Corrie Mckusick,  DO [facility]INTER[facility]  . IR GENERIC HISTORICAL  02/25/2016   IR RADIOLOGIST EVAL & MGMT 02/25/2016 Corrie Mckusick, DO [facility] INTER[facility]  . KNEE ARTHROSCOPY  12/21/2011   Procedure: ARTHROSCOPY KNEE;  Surgeon: Tobi Bastos, MD;  Location: [facility];  Service: Orthopedics;  Laterality: Right;  WITH MEDIAL MENISECTOMY  . LARYNGOSCOPY  01/2015 and  04/21/15  . ORIF LEFT RING FINGER  AND REVASCULARIZATION OF RADIAL SIDE  02-16-2007   DEGLOVING INJURY  . [place] cath    . PORTACATH PLACEMENT  Removed 06/20/3015   as of 05-27-15 remains right chest. 02-16-16 Port has been removed now.  Marland Kitchen ROTATOR CUFF REPAIR  2008   LEFT SHOULDER  . SHOULDER OPEN ROTATOR CUFF REPAIR Right 06/05/2014   Procedure: RIGHT ROTATOR CUFF REPAIR SHOULDER OPEN;  Surgeon: Latanya Maudlin, MD;  Location: [facility];  Service: Orthopedics;  Laterality: Right;  . TONSILLECTOMY     as child    has Bronchogenic cancer of left lung ([place]); ARTHRITIS; Dyspnea; Osteoarthritis of right knee; Meniscus, medial, posterior horn derangement; Acute on chronic renal failure ([place]); Pancytopenia ([place]); Dizziness and giddiness; Tremor; Rotator cuff tear, non-traumatic; Obstructive jaundice due to cancer [facility]); CRF (chronic renal failure); Obstructive jaundice; Malnutrition of moderate degree ([place]); Abdominal mass; Fatigue; Biliary obstruction; Cancer of intra-abdominal lymph nodes, secondary ([place]); Non-small cell lung cancer ([place]); Encounter for antineoplastic immunotherapy; Dehydration; Central line complication; Anorexia; Weight loss; Transaminitis; Hyperphosphatemia; Hypoalbuminemia due to protein-calorie malnutrition ([place]); Leukocytosis; Dysphagia; Protein-calorie malnutrition, severe ([place]); Abnormality of gait; Septic shock ([place]); Sepsis ([place]); Acute kidney injury ([place]); Gram-negative bacteremia; Bacteremia due to Klebsiella pneumoniae; Infection due to Enterobacter cloacae; Chronic fatigue; Rash; Neck pain; Antineoplastic chemotherapy induced anemia; and Diarrhea on his problem list.    is allergic to augmentin [amoxicillin-pot clavulanate]; fentanyl; percocet [oxycodone-acetaminophen]; scopolamine; zolpidem tartrate; carboplatin; and levaquin [levofloxacin].  Allergies as of 03/16/2016      Reactions   Augmentin [amoxicillin-pot Clavulanate] Diarrhea, Other (See Comments)   .Has patient had a  PCN reaction causing immediate rash, facial/tongue/throat swelling, SOB or lightheadedness with hypotension: No Has patient had a PCN reaction causing severe rash involving mucus membranes or skin necrosis: No Has patient had a PCN reaction that required hospitalization No Has patient had a PCN reaction occurring within the last 10 years: Yes If all of the above answers are "NO", then may proceed with Cephalosporin use.   Fentanyl Other (See Comments)   Reaction:  Hallucinations    Percocet [oxycodone-acetaminophen] Nausea And Vomiting   Scopolamine Other (See Comments)   Reaction:  Hallucinations    Zolpidem Tartrate Diarrhea   Carboplatin Itching, Rash   Levaquin [levofloxacin] Other (See Comments)   Reaction:  Knee pain       Medication List       Accurate as of 03/16/16 11:59 PM. Always use your most recent med list.          aspirin 325 MG tablet Take 650 mg by mouth daily as needed for mild pain.   b complex vitamins tablet Take 1 tablet by mouth daily.   cephALEXin 500 MG capsule Commonly known as:  KEFLEX Take 1 capsule (500 mg total) by mouth 4 (four) times daily.   dicyclomine 20 MG tablet Commonly known as:  BENTYL Take 20 mg by mouth every 6 (six) hours as needed (for abdominal cramps).   diphenhydramine-acetaminophen 25-500 MG Tabs tablet Commonly known as:  TYLENOL PM Take 1 tablet by mouth at bedtime as needed (for sleep).  diphenoxylate-atropine 2.5-0.025 MG tablet Commonly known as:  LOMOTIL Take 2 tablets by mouth 4 (four) times daily as needed for diarrhea or loose stools.   feeding supplement (OSMOLITE 1.5 CAL) Liqd Place 237 mLs into feeding tube 3 (three) times daily.   TWOCAL HN Liqd Place 237 mLs into feeding tube 3 (three) times daily.   HYDROmorphone 2 MG tablet Commonly known as:  DILAUDID Take 1-2 tablets (2-4 mg total) by mouth every 4 (four) hours as needed for severe pain.   hydrOXYzine 25 MG tablet Commonly known as:   ATARAX/VISTARIL Take 25 mg by mouth 3 (three) times daily as needed for itching.   LORazepam 1 MG tablet Commonly known as:  ATIVAN Take 0.5-1 tablets (0.5-1 mg total) by mouth every 4 (four) hours as needed for anxiety (and/or nausea).   midodrine 5 MG tablet Commonly known as:  PROAMATINE Take 5 mg by mouth 3 (three) times daily with meals.   morphine 15 MG tablet Commonly known as:  MSIR Take 1 tablet (15 mg total) by mouth every 4 (four) hours as needed for severe pain.   multivitamin with minerals Tabs tablet Take 1 tablet by mouth daily.   mupirocin ointment 2 % Commonly known as:  BACTROBAN Apply 1 application topically 3 (three) times daily as needed. Pt applies around tube area.   omeprazole 40 MG capsule Commonly known as:  PRILOSEC Take 40 mg by mouth daily as needed.   ondansetron 8 MG tablet Commonly known as:  ZOFRAN Take 8 mg by mouth every 8 (eight) hours as needed.   promethazine 25 MG tablet Commonly known as:  PHENERGAN Take 25 mg by mouth every 6 (six) hours as needed for nausea or vomiting.   senna 8.6 MG tablet Commonly known as:  SENOKOT Take 1 tablet by mouth daily as needed for constipation.   vitamin C 500 MG tablet Commonly known as:  ASCORBIC ACID Take 500 mg by mouth daily.        PHYSICAL EXAMINATION  Oncology Vitals 03/16/2016 03/16/2016  Height - 173 cm  Weight - (No Data)  Weight (lbs) - (No Data)  BMI (kg/m2) - -  Temp 98.1 97.8  Pulse 88 88  Resp 17 18  Resp (Historical as of 10/28/11) - -  SpO2 100 100  BSA (m2) - -   BP Readings from Last 2 Encounters:  03/16/16 112/78  03/12/16 105/60    Physical Exam  Constitutional: He is oriented to person, place, and time. He appears malnourished, dehydrated and jaundiced. He appears unhealthy. He appears cachectic.  HENT:  Head: Normocephalic and atraumatic.  Mouth/Throat: Oropharynx is clear and moist.  Eyes: Conjunctivae and EOM are normal. Pupils are equal, round, and  reactive to light. Right eye exhibits no discharge. Left eye exhibits no discharge. No scleral icterus.  Neck: Normal range of motion. Neck supple. No JVD present. No tracheal deviation present. No thyromegaly present.  Cardiovascular: Normal rate, regular rhythm, normal heart sounds and intact distal pulses.   Pulmonary/Chest: Effort normal and breath sounds normal. No respiratory distress. He has no wheezes. He has no rales. He exhibits no tenderness.  Abdominal: Soft. Bowel sounds are normal. He exhibits no distension and no mass. There is tenderness. There is no rebound and no guarding.  Bowel sounds positive in all 4 quads.  Abdomen was soft.  There is some mild tenderness only to the right upper quadrant area at the site of the biliary drain.  Musculoskeletal: Normal range of  motion. He exhibits no edema or deformity.  Lymphadenopathy:    He has no cervical adenopathy.  Neurological: He is alert and oriented to person, place, and time. Gait normal.  Skin: Skin is warm and dry. No rash noted. No erythema. There is pallor.  Psychiatric: Affect normal.  Nursing note and vitals reviewed.   LABORATORY DATA:. Appointment on 03/16/2016  Component Date Value Ref Range Status  . WBC 03/16/2016 7.2  4.0 - 10.3 10e3/uL Final  . NEUT# 03/16/2016 5.7  1.5 - 6.5 10e3/uL Final  . HGB 03/16/2016 11.6* 13.0 - 17.1 g/dL Final  . HCT 03/16/2016 34.8* 38.4 - 49.9 % Final  . Platelets 03/16/2016 134* 140 - 400 10e3/uL Final  . MCV 03/16/2016 105.9* 79.3 - 98.0 fL Final  . MCH 03/16/2016 35.4* 27.2 - 33.4 pg Final  . MCHC 03/16/2016 33.4  32.0 - 36.0 g/dL Final  . RBC 03/16/2016 3.29* 4.20 - 5.82 10e6/uL Final  . RDW 03/16/2016 14.4  11.0 - 14.6 % Final  . lymph# 03/16/2016 0.6* 0.9 - 3.3 10e3/uL Final  . MONO# 03/16/2016 0.8  0.1 - 0.9 10e3/uL Final  . Eosinophils Absolute 03/16/2016 0.1  0.0 - 0.5 10e3/uL Final  . Basophils Absolute 03/16/2016 0.0  0.0 - 0.1 10e3/uL Final  . NEUT% 03/16/2016 78.9*  39.0 - 75.0 % Final  . LYMPH% 03/16/2016 8.0* 14.0 - 49.0 % Final  . MONO% 03/16/2016 10.6  0.0 - 14.0 % Final  . EOS% 03/16/2016 2.0  0.0 - 7.0 % Final  . BASO% 03/16/2016 0.5  0.0 - 2.0 % Final  . Sodium 03/16/2016 138  136 - 145 mEq/L Final  . Potassium 03/16/2016 4.8  3.5 - 5.1 mEq/L Final  . Chloride 03/16/2016 109  98 - 109 mEq/L Final  . CO2 03/16/2016 22  22 - 29 mEq/L Final  . Glucose 03/16/2016 128  70 - 140 mg/dl Final  . BUN 03/16/2016 30.5* 7.0 - 26.0 mg/dL Final  . Creatinine 03/16/2016 1.1  0.7 - 1.3 mg/dL Final  . Total Bilirubin 03/16/2016 8.13* 0.20 - 1.20 mg/dL Final  . Alkaline Phosphatase 03/16/2016 239* 40 - 150 U/L Final  . AST 03/16/2016 67* 5 - 34 U/L Final  . ALT 03/16/2016 96* 0 - 55 U/L Final  . Total Protein 03/16/2016 6.6  6.4 - 8.3 g/dL Final  . Albumin 03/16/2016 2.5* 3.5 - 5.0 g/dL Final  . Calcium 03/16/2016 9.5  8.4 - 10.4 mg/dL Final  . Anion Gap 03/16/2016 7  3 - 11 mEq/L Final  . EGFR 03/16/2016 67* >90 ml/min/1.73 m2 Final    RADIOGRAPHIC STUDIES: No results found.  ASSESSMENT/PLAN:    Transaminitis Patient's liver enzymes have slightly improved.  AST has improved from 105 down to 67; and ALT has increased from 121 down to 96.  Patient underwent a revision of his biliary stent this past Friday, 03/12/2016; and continues to appear fairly jaundiced.  See further notes for details.  Hypoalbuminemia due to protein-calorie malnutrition (HCC) Albumin has decreased from 3.1 down to 2.5.  Patient was encouraged to push protein in his diet is much as possible.  Hyperphosphatemia Alkaline phosphatase remains elevated.  Most likely, secondary to patient's recent biliary obstruction.  Will continue to monitor closely.  Bronchogenic cancer of left lung [facility]) Patient received his last Keytruda on 01/22/2016.  All further immunotherapy has been held secondary to his increased bilirubin and biliary drain procedures.  Patient has plans to return for IV  fluid rehydration on  Friday, 03/19/2016.  Will also add labs and a symptom management clinic visit for next Wednesday, 03/24/2016.  He is scheduled for labs, follow up visit, and his next cycle of immunotherapy on 04/01/2016.   Biliary obstruction Patient underwent a revision of his biliary stent/drain on Friday, 03/12/2016.  Patient now has both an internal biliary drain and an external drain that his wife has been managing.  Wife states that she flushes the external drain once daily.  She is requesting assistance in managing the external drain at home.  She also states that she needs some additional saline syringes as well.  She states that she has been in touch with the interventional radiology physicians regarding the patient's stent over this past weekend.  Patient has been suffering with chronic diarrhea for the past several days; and also has some nausea.  He states he's had no vomiting.  He also denies any recent fevers or chills.  He does complain of some mild tenderness surrounding the right upper quadrant biliary stent area.  Exam today reveals abdomen soft with bowel sounds positive in all 4 quadrants.  There is some tenderness surrounding the external biliary drain site.  There is also some mild erythema and trace edema.  Dr. [name] in to examine patient briefly as well; and he recommended that patient initiated Keflex antibiotics in case there is any mild infection secondary to the site.  Labs obtained today revealed that bilirubin remains essentially the same.  Bilirubin has only slightly improved from 8.17, down to 8.13.  Patient remains jaundiced.  This provider will order home health to manage the biliary drain and to provide saline flushes in the future.  Dehydration Patient continues to appear fairly fatigued, weak, and dehydrated.  He received IV fluid rehydration while at the cancer Center today.  He has plans to return this coming Friday, 03/19/2016 for additional IV fluid  rehydration.  Diarrhea Patient states he's been experiencing frequent diarrhea for the past few days.  Has only occasionally try to Imodium.  Patient appears dehydrated today.  He was advised to take Imodium over-the-counter and to alternate with Lomotil. Lomotil prescription was given to the patient today.  He will receive IV fluid rehydration today.   Patient stated understanding of all instructions; and was in agreement with this plan of care. The patient knows to call the clinic with any problems, questions or concerns.   Total time spent with patient was 40 minutes;  with greater than 75 percent of that time spent in face to face counseling regarding patient's symptoms,  and coordination of care and follow up.  Disclaimer:This dictation was prepared with Dragon/digital dictation along with [employer]. Any transcriptional errors that result from this process are unintentional.  Drue Second, NP 03/18/2016   ADDENDUM: Hematology/Oncology Attending: I had a face to face encounter with the patient. I recommended his care plan. This is a very pleasant 70 years old white male with stage IV non-small cell lung cancer status post several chemotherapy regimens and currently undergoing treatment with immunotherapy with Ketruda (pembrolizumab) status post 4 cycles. His treatment is currently on hold because of significant elevation of his serum bilirubin. He underwent stent replacement by his gastroenterologist with no improvement in his condition. He was also seen by interventional radiology and underwent percutaneous transhepatic cholangiogram with placement of internal/external biliary drain. There is improvement in his other liver enzyme but serum bilirubin is still elevated. He came to the clinic today complaining of persistent diarrhea for several days.  His by mouth intake is decreased and the patient is dehydrated. We will arrange for him to receive IV hydration with normal saline  today. For the diarrhea, he was advised to take Imodium alternating with Lomotil. I will continue to hold his treatment with [place] (pembrolizumab) for now until improvement of serum bilirubin. He would have repeat comprehensive metabolic panel week. The patient would come back for follow-up visit as previously scheduled. He was advised to call immediately if he has any concerning symptoms in the interval.  Disclaimer: This note was dictated with voice recognition software. Similar sounding words can inadvertently be transcribed and may be missed upon review. Eilleen Kempf., MD 03/19/16

## 2016-03-18 NOTE — Assessment & Plan Note (Signed)
Alkaline phosphatase remains elevated.  Most likely, secondary to patient's recent biliary obstruction.  Will continue to monitor closely.

## 2016-03-19 ENCOUNTER — Encounter (HOSPITAL_COMMUNITY): Payer: Self-pay | Admitting: Interventional Radiology

## 2016-03-19 ENCOUNTER — Ambulatory Visit (HOSPITAL_BASED_OUTPATIENT_CLINIC_OR_DEPARTMENT_OTHER): Payer: Medicare Other | Admitting: Nurse Practitioner

## 2016-03-19 ENCOUNTER — Other Ambulatory Visit: Payer: Self-pay | Admitting: Nurse Practitioner

## 2016-03-19 ENCOUNTER — Ambulatory Visit (HOSPITAL_COMMUNITY)
Admission: RE | Admit: 2016-03-19 | Discharge: 2016-03-19 | Disposition: A | Discharge status: Home / Self Care | Payer: Medicare Other | Source: Ambulatory Visit | Attending: Interventional Radiology | Admitting: Interventional Radiology

## 2016-03-19 ENCOUNTER — Other Ambulatory Visit (HOSPITAL_COMMUNITY): Payer: Self-pay | Admitting: Interventional Radiology

## 2016-03-19 ENCOUNTER — Emergency Department (HOSPITAL_COMMUNITY): Payer: Medicare Other

## 2016-03-19 ENCOUNTER — Encounter (HOSPITAL_COMMUNITY): Payer: Self-pay | Admitting: Internal Medicine

## 2016-03-19 ENCOUNTER — Ambulatory Visit: Payer: Medicare Other | Admitting: Nurse Practitioner

## 2016-03-19 ENCOUNTER — Inpatient Hospital Stay (HOSPITAL_COMMUNITY)
Admission: EM | Admit: 2016-03-19 | Discharge: 2016-03-21 | DRG: 907 | Disposition: A | Discharge status: Hospice | Payer: Medicare Other | Attending: Internal Medicine | Admitting: Internal Medicine

## 2016-03-19 ENCOUNTER — Encounter: Payer: Self-pay | Admitting: Nurse Practitioner

## 2016-03-19 ENCOUNTER — Ambulatory Visit (HOSPITAL_BASED_OUTPATIENT_CLINIC_OR_DEPARTMENT_OTHER): Payer: Medicare Other

## 2016-03-19 VITALS — BP 102/64 | HR 87 | Temp 97.6°F | Resp 16 | Ht 68.0 in | Wt 137.4 lb

## 2016-03-19 DIAGNOSIS — R300 Dysuria: Secondary | ICD-10-CM

## 2016-03-19 DIAGNOSIS — R197 Diarrhea, unspecified: Secondary | ICD-10-CM

## 2016-03-19 DIAGNOSIS — K830 Cholangitis: Secondary | ICD-10-CM | POA: Diagnosis not present

## 2016-03-19 DIAGNOSIS — K831 Obstruction of bile duct: Secondary | ICD-10-CM | POA: Diagnosis present

## 2016-03-19 DIAGNOSIS — Z885 Allergy status to narcotic agent status: Secondary | ICD-10-CM

## 2016-03-19 DIAGNOSIS — R740 Nonspecific elevation of levels of transaminase and lactic acid dehydrogenase [LDH]: Secondary | ICD-10-CM | POA: Diagnosis present

## 2016-03-19 DIAGNOSIS — Z66 Do not resuscitate: Secondary | ICD-10-CM | POA: Diagnosis present

## 2016-03-19 DIAGNOSIS — Z8249 Family history of ischemic heart disease and other diseases of the circulatory system: Secondary | ICD-10-CM

## 2016-03-19 DIAGNOSIS — Z515 Encounter for palliative care: Secondary | ICD-10-CM | POA: Diagnosis not present

## 2016-03-19 DIAGNOSIS — Z974 Presence of external hearing-aid: Secondary | ICD-10-CM

## 2016-03-19 DIAGNOSIS — E440 Moderate protein-calorie malnutrition: Secondary | ICD-10-CM | POA: Diagnosis present

## 2016-03-19 DIAGNOSIS — Z923 Personal history of irradiation: Secondary | ICD-10-CM

## 2016-03-19 DIAGNOSIS — C3492 Malignant neoplasm of unspecified part of left bronchus or lung: Secondary | ICD-10-CM | POA: Diagnosis not present

## 2016-03-19 DIAGNOSIS — Z79899 Other long term (current) drug therapy: Secondary | ICD-10-CM

## 2016-03-19 DIAGNOSIS — R17 Unspecified jaundice: Secondary | ICD-10-CM

## 2016-03-19 DIAGNOSIS — E860 Dehydration: Secondary | ICD-10-CM

## 2016-03-19 DIAGNOSIS — N400 Enlarged prostate without lower urinary tract symptoms: Secondary | ICD-10-CM | POA: Diagnosis present

## 2016-03-19 DIAGNOSIS — D539 Nutritional anemia, unspecified: Secondary | ICD-10-CM | POA: Diagnosis present

## 2016-03-19 DIAGNOSIS — Z931 Gastrostomy status: Secondary | ICD-10-CM

## 2016-03-19 DIAGNOSIS — Z801 Family history of malignant neoplasm of trachea, bronchus and lung: Secondary | ICD-10-CM | POA: Diagnosis not present

## 2016-03-19 DIAGNOSIS — Z87891 Personal history of nicotine dependence: Secondary | ICD-10-CM

## 2016-03-19 DIAGNOSIS — K8309 Other cholangitis: Secondary | ICD-10-CM

## 2016-03-19 DIAGNOSIS — D72829 Elevated white blood cell count, unspecified: Secondary | ICD-10-CM | POA: Diagnosis present

## 2016-03-19 DIAGNOSIS — Z7982 Long term (current) use of aspirin: Secondary | ICD-10-CM

## 2016-03-19 DIAGNOSIS — Z85118 Personal history of other malignant neoplasm of bronchus and lung: Secondary | ICD-10-CM

## 2016-03-19 DIAGNOSIS — Z9221 Personal history of antineoplastic chemotherapy: Secondary | ICD-10-CM

## 2016-03-19 DIAGNOSIS — C3490 Malignant neoplasm of unspecified part of unspecified bronchus or lung: Secondary | ICD-10-CM | POA: Diagnosis present

## 2016-03-19 DIAGNOSIS — Z881 Allergy status to other antibiotic agents status: Secondary | ICD-10-CM

## 2016-03-19 DIAGNOSIS — N179 Acute kidney failure, unspecified: Secondary | ICD-10-CM | POA: Diagnosis present

## 2016-03-19 DIAGNOSIS — L03311 Cellulitis of abdominal wall: Secondary | ICD-10-CM | POA: Diagnosis present

## 2016-03-19 DIAGNOSIS — H9193 Unspecified hearing loss, bilateral: Secondary | ICD-10-CM | POA: Diagnosis present

## 2016-03-19 DIAGNOSIS — Y838 Other surgical procedures as the cause of abnormal reaction of the patient, or of later complication, without mention of misadventure at the time of the procedure: Secondary | ICD-10-CM | POA: Diagnosis present

## 2016-03-19 DIAGNOSIS — R0603 Acute respiratory distress: Secondary | ICD-10-CM | POA: Diagnosis present

## 2016-03-19 DIAGNOSIS — R7401 Elevation of levels of liver transaminase levels: Secondary | ICD-10-CM

## 2016-03-19 DIAGNOSIS — Z6821 Body mass index (BMI) 21.0-21.9, adult: Secondary | ICD-10-CM

## 2016-03-19 DIAGNOSIS — T85590A Other mechanical complication of bile duct prosthesis, initial encounter: Secondary | ICD-10-CM | POA: Diagnosis present

## 2016-03-19 DIAGNOSIS — R0600 Dyspnea, unspecified: Secondary | ICD-10-CM | POA: Diagnosis present

## 2016-03-19 DIAGNOSIS — C801 Malignant (primary) neoplasm, unspecified: Secondary | ICD-10-CM | POA: Diagnosis present

## 2016-03-19 DIAGNOSIS — R140 Abdominal distension (gaseous): Secondary | ICD-10-CM

## 2016-03-19 DIAGNOSIS — R5382 Chronic fatigue, unspecified: Secondary | ICD-10-CM

## 2016-03-19 DIAGNOSIS — C799 Secondary malignant neoplasm of unspecified site: Secondary | ICD-10-CM

## 2016-03-19 DIAGNOSIS — Z888 Allergy status to other drugs, medicaments and biological substances status: Secondary | ICD-10-CM

## 2016-03-19 HISTORY — PX: IR GENERIC HISTORICAL: IMG1180011

## 2016-03-19 LAB — URINALYSIS, ROUTINE W REFLEX MICROSCOPIC
Bilirubin Urine: NEGATIVE
GLUCOSE, UA: NEGATIVE mg/dL
HGB URINE DIPSTICK: NEGATIVE
Ketones, ur: NEGATIVE mg/dL
Leukocytes, UA: NEGATIVE
Nitrite: NEGATIVE
PROTEIN: NEGATIVE mg/dL
Specific Gravity, Urine: 1.01 (ref 1.005–1.030)
pH: 6 (ref 5.0–8.0)

## 2016-03-19 LAB — CBC WITH DIFFERENTIAL/PLATELET
BASO%: 0.2 % (ref 0.0–2.0)
Basophils Absolute: 0 10*3/uL (ref 0.0–0.1)
EOS%: 1.7 % (ref 0.0–7.0)
Eosinophils Absolute: 0.2 10*3/uL (ref 0.0–0.5)
HEMATOCRIT: 31.4 % — AB (ref 38.4–49.9)
HEMOGLOBIN: 11 g/dL — AB (ref 13.0–17.1)
LYMPH#: 0.8 10*3/uL — AB (ref 0.9–3.3)
LYMPH%: 6.5 % — ABNORMAL LOW (ref 14.0–49.0)
MCH: 35.7 pg — ABNORMAL HIGH (ref 27.2–33.4)
MCHC: 35 g/dL (ref 32.0–36.0)
MCV: 101.9 fL — ABNORMAL HIGH (ref 79.3–98.0)
MONO#: 1.4 10*3/uL — AB (ref 0.1–0.9)
MONO%: 10.9 % (ref 0.0–14.0)
NEUT%: 80.7 % — ABNORMAL HIGH (ref 39.0–75.0)
NEUTROS ABS: 10.2 10*3/uL — AB (ref 1.5–6.5)
PLATELETS: 149 10*3/uL (ref 140–400)
RBC: 3.08 10*6/uL — ABNORMAL LOW (ref 4.20–5.82)
RDW: 14.3 % (ref 11.0–14.6)
WBC: 12.6 10*3/uL — AB (ref 4.0–10.3)

## 2016-03-19 LAB — COMPREHENSIVE METABOLIC PANEL
ALBUMIN: 2.7 g/dL — AB (ref 3.5–5.0)
ALT: 100 U/L — AB (ref 0–55)
ANION GAP: 10 meq/L (ref 3–11)
AST: 97 U/L — ABNORMAL HIGH (ref 5–34)
Alkaline Phosphatase: 266 U/L — ABNORMAL HIGH (ref 40–150)
BILIRUBIN TOTAL: 9.77 mg/dL — AB (ref 0.20–1.20)
BUN: 50.1 mg/dL — ABNORMAL HIGH (ref 7.0–26.0)
CALCIUM: 9.1 mg/dL (ref 8.4–10.4)
CO2: 20 meq/L — AB (ref 22–29)
CREATININE: 1.5 mg/dL — AB (ref 0.7–1.3)
Chloride: 105 mEq/L (ref 98–109)
EGFR: 47 mL/min/{1.73_m2} — ABNORMAL LOW (ref >90)
Glucose: 100 mg/dl (ref 70–140)
Potassium: 4.3 mEq/L (ref 3.5–5.1)
Sodium: 135 mEq/L — ABNORMAL LOW (ref 136–145)
TOTAL PROTEIN: 6.4 g/dL (ref 6.4–8.3)

## 2016-03-19 LAB — TSH: TSH: 2.964 m(IU)/L (ref 0.320–4.118)

## 2016-03-19 LAB — BILIRUBIN, DIRECT: BILIRUBIN DIRECT: 6.8 mg/dL — AB (ref 0.1–0.5)

## 2016-03-19 LAB — I-STAT CG4 LACTIC ACID, ED: LACTIC ACID, VENOUS: 1.8 mmol/L (ref 0.5–1.9)

## 2016-03-19 LAB — LIPASE, BLOOD: Lipase: 13 U/L (ref 11–51)

## 2016-03-19 MED ORDER — DIPHENOXYLATE-ATROPINE 2.5-0.025 MG PO TABS: 2.0000 | ORAL_TABLET | Freq: Four times a day (QID) | ORAL | Status: DC | PRN

## 2016-03-19 MED ORDER — SODIUM CHLORIDE 0.9 % IV SOLN
Freq: Once | INTRAVENOUS | Status: AC
  Administered 2016-03-19: 14:00:00 via INTRAVENOUS

## 2016-03-19 MED ORDER — SODIUM CHLORIDE 0.9 % IV SOLN
INTRAVENOUS | Status: DC
  Administered 2016-03-20: 01:00:00 via INTRAVENOUS

## 2016-03-19 MED ORDER — IOPAMIDOL (ISOVUE-300) INJECTION 61%
INTRAVENOUS | Status: AC
  Administered 2016-03-19: 18 mL
  Filled 2016-03-19: qty 50

## 2016-03-19 MED ORDER — PANTOPRAZOLE SODIUM 40 MG PO TBEC
80.0000 mg | DELAYED_RELEASE_TABLET | Freq: Every day | ORAL | Status: DC
Start: 2016-03-20 — End: 2016-03-21
  Administered 2016-03-20 – 2016-03-21 (×2): 80 mg via ORAL
  Filled 2016-03-19 (×2): qty 2

## 2016-03-19 MED ORDER — SODIUM CHLORIDE 0.9 % IV BOLUS (SEPSIS)
1000.0000 mL | Freq: Once | INTRAVENOUS | Status: AC
  Administered 2016-03-19: 1000 mL via INTRAVENOUS

## 2016-03-19 MED ORDER — TRIAMCINOLONE ACETONIDE 0.1 % EX OINT
1.0000 "application " | TOPICAL_OINTMENT | Freq: Two times a day (BID) | CUTANEOUS | Status: DC
  Administered 2016-03-20 – 2016-03-21 (×4): 1 via TOPICAL
  Filled 2016-03-19 (×2): qty 15

## 2016-03-19 MED ORDER — TWOCAL HN PO LIQD
237.0000 mL | Freq: Three times a day (TID) | ORAL | Status: DC
  Administered 2016-03-20 (×4): 237 mL
  Filled 2016-03-19 (×7): qty 237

## 2016-03-19 MED ORDER — PIPERACILLIN-TAZOBACTAM 3.375 G IVPB 30 MIN
3.3750 g | Freq: Once | INTRAVENOUS | Status: AC
  Administered 2016-03-19: 3.375 g via INTRAVENOUS
  Filled 2016-03-19: qty 50

## 2016-03-19 MED ORDER — VANCOMYCIN HCL IN DEXTROSE 1-5 GM/200ML-% IV SOLN
1000.0000 mg | Freq: Once | INTRAVENOUS | Status: AC
  Administered 2016-03-20: 1000 mg via INTRAVENOUS
  Filled 2016-03-19: qty 200

## 2016-03-19 MED ORDER — MIDODRINE HCL 5 MG PO TABS
5.0000 mg | ORAL_TABLET | Freq: Three times a day (TID) | ORAL | Status: DC
  Administered 2016-03-20 – 2016-03-21 (×5): 5 mg
  Filled 2016-03-19 (×7): qty 1

## 2016-03-19 MED ORDER — LACTATED RINGERS IV SOLN
INTRAVENOUS | Status: DC
  Administered 2016-03-20 – 2016-03-21 (×3): via INTRAVENOUS

## 2016-03-19 MED ORDER — ONDANSETRON HCL 4 MG PO TABS
4.0000 mg | ORAL_TABLET | Freq: Four times a day (QID) | ORAL | Status: DC | PRN
  Filled 2016-03-19: qty 1

## 2016-03-19 MED ORDER — ACETAMINOPHEN 325 MG PO TABS
650.0000 mg | ORAL_TABLET | Freq: Four times a day (QID) | ORAL | Status: DC | PRN
Start: 2016-03-19 — End: 2016-03-21

## 2016-03-19 MED ORDER — LIDOCAINE HCL 1 % IJ SOLN
INTRAMUSCULAR | Status: AC
  Filled 2016-03-19: qty 20

## 2016-03-19 MED ORDER — LIDOCAINE HCL 1 % IJ SOLN
INTRAMUSCULAR | Status: DC | PRN
  Administered 2016-03-19: 5 mL

## 2016-03-19 MED ORDER — OXYCODONE-ACETAMINOPHEN 5-325 MG PO TABS
ORAL_TABLET | ORAL | Status: AC
  Filled 2016-03-19: qty 1

## 2016-03-19 MED ORDER — ENOXAPARIN SODIUM 40 MG/0.4ML ~~LOC~~ SOLN
40.0000 mg | SUBCUTANEOUS | Status: DC
  Administered 2016-03-20 (×2): 40 mg via SUBCUTANEOUS
  Filled 2016-03-19 (×2): qty 0.4

## 2016-03-19 MED ORDER — DICYCLOMINE HCL 20 MG PO TABS
20.0000 mg | ORAL_TABLET | Freq: Four times a day (QID) | ORAL | Status: DC | PRN
  Filled 2016-03-19: qty 1

## 2016-03-19 MED ORDER — ONDANSETRON HCL 4 MG/2ML IJ SOLN: 4.0000 mg | Freq: Four times a day (QID) | INTRAMUSCULAR | Status: DC | PRN

## 2016-03-19 MED ORDER — OXYCODONE HCL 5 MG PO TABS
10.0000 mg | ORAL_TABLET | ORAL | Status: DC
  Administered 2016-03-20 – 2016-03-21 (×10): 10 mg
  Filled 2016-03-19 (×10): qty 2

## 2016-03-19 MED ORDER — PIPERACILLIN-TAZOBACTAM 3.375 G IVPB
3.3750 g | Freq: Three times a day (TID) | INTRAVENOUS | Status: DC
  Administered 2016-03-20 – 2016-03-21 (×5): 3.375 g via INTRAVENOUS
  Filled 2016-03-19 (×5): qty 50

## 2016-03-19 MED ORDER — MUPIROCIN 2 % EX OINT
1.0000 "application " | TOPICAL_OINTMENT | Freq: Two times a day (BID) | CUTANEOUS | Status: DC
  Administered 2016-03-20 – 2016-03-21 (×3): 1 via TOPICAL
  Filled 2016-03-19: qty 22

## 2016-03-19 MED ORDER — ACETAMINOPHEN 650 MG RE SUPP: 650.0000 mg | Freq: Four times a day (QID) | RECTAL | Status: DC | PRN

## 2016-03-19 MED ORDER — SENNA 8.6 MG PO TABS: 1.0000 | ORAL_TABLET | Freq: Every day | ORAL | Status: DC | PRN

## 2016-03-19 MED ORDER — OSMOLITE 1.5 CAL PO LIQD
237.0000 mL | Freq: Three times a day (TID) | ORAL | Status: DC
  Administered 2016-03-20 (×4): 237 mL
  Filled 2016-03-19 (×6): qty 237

## 2016-03-19 MED ORDER — OXYCODONE-ACETAMINOPHEN 5-325 MG PO TABS
1.0000 | ORAL_TABLET | Freq: Once | ORAL | Status: AC
Start: 2016-03-19 — End: 2016-03-19
  Administered 2016-03-19: 1 via ORAL

## 2016-03-19 MED ORDER — LORAZEPAM 0.5 MG PO TABS
0.5000 mg | ORAL_TABLET | ORAL | Status: DC | PRN
  Administered 2016-03-21: 1 mg via ORAL
  Filled 2016-03-19: qty 2

## 2016-03-19 MED ORDER — DIPHENHYDRAMINE HCL 50 MG/ML IJ SOLN: 12.5000 mg | INTRAMUSCULAR | Status: DC | PRN

## 2016-03-19 NOTE — ED Notes (Signed)
ED Provider at bedside. 

## 2016-03-19 NOTE — ED Triage Notes (Addendum)
Per [place] pt sent for evaluation for jaundice and dehydration  Pt recent IR for R abd biliary drain.

## 2016-03-19 NOTE — ED Notes (Signed)
RN drawing labs 

## 2016-03-19 NOTE — ED Notes (Signed)
Bed: ZD63 Expected date:  Expected time:  Means of arrival:  Comments: EMS CA patient w cellulitis

## 2016-03-19 NOTE — Assessment & Plan Note (Signed)
Patient received his last Keytruda on 01/22/2016.  All further immunotherapy has been held secondary to his increased bilirubin and biliary drain procedures.  Patient has plans to return for IV fluid rehydration on Friday, 03/19/2016.  Will also add labs and a symptom management clinic visit for next Wednesday, 03/24/2016.  He is scheduled for labs, follow up visit, and his next cycle of immunotherapy on 04/01/2016.  ________________________________  Update: Patient presented back to the [facility] today to receive IV fluid rehydration.  See further notes for details of today's visit.  Patient was transported to the emergency department this afternoon for further evaluation of possible pending sepsis.

## 2016-03-19 NOTE — ED Notes (Signed)
Primary nurse aware of pt status and complaint.

## 2016-03-19 NOTE — Progress Notes (Signed)
Pharmacy Antibiotic Note  [name] is a 70 y.o. male with PMH metastatic lung Ca on Keytruda admitted on 03/19/2016 with suspected intra-abdominal infection. S/p bile duct stent placement and replacement in Nov 2017. Pt continued to have issues with stent and internal/external drains placed by IR. Now with chills, vomiting, diarrhea. Patient had been on keflex per tube, but now Pharmacy consulted for Zosyn dosing.   Plan:  Zosyn 3.375 g IV given once over 30 minutes, then every 8 hrs by 4-hr infusion  Height: '5\' 7"'$  (170.2 cm) Weight: 134 lb 6.4 oz (61 kg) IBW/kg (Calculated) : 66.1  Temp (24hrs), Avg:98.1 F (36.7 C), Min:97.6 F (36.4 C), Max:98.5 F (36.9 C)   Recent Labs Lab 03/16/16 1114 03/19/16 1232 03/19/16 1816  WBC 7.2 12.6*  --   CREATININE 1.1 1.5*  --   LATICACIDVEN  --   --  1.80    Estimated Creatinine Clearance: 39 mL/min (by C-G formula based on SCr of 1.5 mg/dL (H)).    Allergies  Allergen Reactions  . Augmentin [Amoxicillin-Pot Clavulanate] Diarrhea and Other (See Comments)    .Has patient had a PCN reaction causing immediate rash, facial/tongue/throat swelling, SOB or lightheadedness with hypotension: No Has patient had a PCN reaction causing severe rash involving mucus membranes or skin necrosis: No Has patient had a PCN reaction that required hospitalization No Has patient had a PCN reaction occurring within the last 10 years: Yes If all of the above answers are "NO", then may proceed with Cephalosporin use.   . Fentanyl Other (See Comments)    Reaction:  Hallucinations   . Scopolamine Other (See Comments)    Reaction:  Hallucinations   . Zolpidem Tartrate Diarrhea  . Morphine And Related Itching  . Carboplatin Itching and Rash  . Levaquin [Levofloxacin] Other (See Comments)    Reaction:  Knee pain      Thank you for allowing pharmacy to be a part of this patient's care.  Reuel Boom, PharmD, BCPS Pager: [phone] 03/19/2016, 8:30  PM

## 2016-03-19 NOTE — ED Provider Notes (Signed)
[facility] DEPT Provider Note   CSN: [id number] Arrival date & time: 03/19/16  1544     History   Chief Complaint Chief Complaint  Patient presents with  . Cancer  . Jaundice    HPI [name] is a 70 y.o. male.  The history is provided by the patient.  CC: chills  Onset/Duration: 2 days Timing: intermittent Location: generalized Quality: chills Severity: moderate Modifying Factors:  Improved by: nothing  Worsened by: nothing Associated Signs/Symptoms:  Pertinent (+): jaundice, diarrhea, mild abd pain around drain, rhinorrhea  Pertinent (-): fevers, N/V, CP, sob, cough, urinary sx Context: h/o metastatic lung cancer with abd LAD resulting in biliary obstruction s/p drain. Pt currently on Keflex for empiric Abx. Had drain changed today. Seen at Oncologist office and sent for admission due to Biliary obstruction labs trending up and possible infection.  Oncologist: Dr. [name]  Past Medical History:  Diagnosis Date  . Abnormality of gait 03/12/2015  . Acute meniscal tear of knee RIGHT KNEE  . Antineoplastic chemotherapy induced anemia 03/04/2016  . ARF (acute renal failure) ([place]) 03/2015   pre-renal patient was septic. is improved now.  . Arthritis RIGHT SHOULDER  . BPH (benign prostatic hypertrophy)   . Dizziness and giddiness 04/23/2014  . Gastrostomy tube dependent ([place])    Continous feedings per Gordy Councilman tube"unable to swallow"- Infusion pump for Nutrition  . HOH (hard of hearing)    bilateral hearing aids  . Hypertension    no longer an issue. Now runs usually low- tekes med to increase blood pressure  . Immature cataract BILATERAL  . Non-small cell lung cancer ([place]) DX SEPT 2010  W/ CHEMORADIATION AT THAT TIME -- NOW  W/ METS--  CURRENTLY ON MAINTENANCE CHEMO TX  EVERY 33 DAYS   ONCOLOGIST- DR [facility]- lung cancer with metastasis.  . Orthostatic hypotension    B/P tends to run 110/70 or less -using med to raise B/P.  Marland Kitchen Pneumonia   . Radiation  01/08/09-01/21/09   Mediastinum 30 Gy x 12 fractions  . Radiation 08/19/14-09/04/14   Palliative RT Porta hepatic LN 30 Gy  . Sepsis ([place]) 03/2015  . Shortness of breath dyspnea    with exertion  . Swallowing impairment    "Unable to swallow"  . Transfusion history    last 1-2 yrs ago with chemotherapy  . Tremor 04/23/2014   Right hand    Patient Active Problem List   Diagnosis Date Noted  . Diarrhea 03/18/2016  . Antineoplastic chemotherapy induced anemia 03/04/2016  . Rash 12/19/2015  . Neck pain 12/19/2015  . Chronic fatigue 11/10/2015  . Bacteremia due to Klebsiella pneumoniae 04/29/2015  . Infection due to Enterobacter cloacae 04/29/2015  . Gram-negative bacteremia 04/25/2015  . Septic shock ([place]) 04/23/2015  . Sepsis ([place])   . Acute kidney injury ([place])   . Abnormality of gait 03/12/2015  . Protein-calorie malnutrition, severe ([place]) 11/26/2014  . Hypoalbuminemia due to protein-calorie malnutrition ([place]) 11/18/2014  . Leukocytosis 11/18/2014  . Dysphagia 11/18/2014  . Dehydration 10/03/2014  . Central line complication 24/26/8341  . Anorexia 10/03/2014  . Weight loss 10/03/2014  . Transaminitis 10/03/2014  . Hyperphosphatemia 10/03/2014  . Encounter for antineoplastic immunotherapy 09/28/2014  . Non-small cell lung cancer ([place]) 09/21/2014  . 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 Biliary obstruction   . Fatigue 08/27/2014  . Abdominal mass   . CRF (chronic renal failure) 08/07/2014  . Obstructive jaundice 08/07/2014  . Malnutrition of moderate degree ([place]) 08/07/2014  .  Obstructive jaundice due to cancer ([place]) 08/06/2014  . Rotator cuff tear, non-traumatic 06/05/2014  . Dizziness and giddiness 04/23/2014  . Tremor 04/23/2014  . Acute on chronic renal failure ([place]) 05/24/2013  . Pancytopenia ([place]) 05/24/2013  . Osteoarthritis of right knee 12/21/2011  . Meniscus, medial, posterior horn derangement 12/21/2011  . Dyspnea 12/04/2010  .  Bronchogenic cancer of left lung ([place]) 12/11/2008  . ARTHRITIS 12/10/2008    Past Surgical History:  Procedure Laterality Date  . BILATERAL EAR DRUM SURGERY  1960'S  . BILE DUCT STENT PLACEMENT    . CATARACT EXTRACTION Bilateral   . ERCP N/A 08/09/2014   Procedure: ENDOSCOPIC RETROGRADE CHOLANGIOPANCREATOGRAPHY (ERCP);  Surgeon: Carol Ada, MD;  Location: [facility];  Service: Endoscopy;  Laterality: N/A;  . ERCP N/A 05/23/2015   Procedure: ENDOSCOPIC RETROGRADE CHOLANGIOPANCREATOGRAPHY (ERCP);  Surgeon: Carol Ada, MD;  Location: [facility] ENDOSCOPY;  Service: Endoscopy;  Laterality: N/A;  . ERCP N/A 05/30/2015   Procedure: ENDOSCOPIC RETROGRADE CHOLANGIOPANCREATOGRAPHY (ERCP);  Surgeon: Carol Ada, MD;  Location: [facility];  Service: Endoscopy;  Laterality: N/A;  . ERCP N/A 02/18/2016   Procedure: ENDOSCOPIC RETROGRADE CHOLANGIOPANCREATOGRAPHY (ERCP);  Surgeon: Carol Ada, MD;  Location: [facility];  Service: Endoscopy;  Laterality: N/A;  . ESOPHAGOGASTRODUODENOSCOPY (EGD) WITH PROPOFOL N/A 08/09/2014   Procedure: ESOPHAGOGASTRODUODENOSCOPY (EGD) WITH PROPOFOL;  Surgeon: Carol Ada, MD;  Location: [facility] ENDOSCOPY;  Service: Endoscopy;  Laterality: N/A;  . EUS N/A 08/09/2014   Procedure: UPPER ENDOSCOPIC ULTRASOUND (EUS) RADIAL;  Surgeon: Carol Ada, MD;  Location: [facility] ENDOSCOPY;  Service: Endoscopy;  Laterality: N/A;  . FINE NEEDLE ASPIRATION N/A 08/09/2014   Procedure: FINE NEEDLE ASPIRATION (FNA) LINEAR;  Surgeon: Carol Ada, MD;  Location: [facility] ENDOSCOPY;  Service: Endoscopy;  Laterality: N/A;  . GASTROSTOMY TUBE PLACEMENT  10/2014  . IR GENERIC HISTORICAL  03/02/2016   IR FLUORO RM 30-60 MIN 03/02/2016 Corrie Mckusick, DO [facility]-INTERV RAD  . IR GENERIC HISTORICAL  03/12/2016   IR INT EXT BILIARY DRAIN WITH CHOLANGIOGRAM 03/12/2016 Corrie Mckusick, DO [facility]INTER[facility]  . IR GENERIC HISTORICAL  02/25/2016   IR RADIOLOGIST EVAL & MGMT 02/25/2016 Corrie Mckusick, DO [facility] INTER[facility]  . IR GENERIC  HISTORICAL  03/19/2016   IR EXCHANGE BILIARY DRAIN 03/19/2016 Greggory Keen, MD [facility]  . KNEE ARTHROSCOPY  12/21/2011   Procedure: ARTHROSCOPY KNEE;  Surgeon: Tobi Bastos, MD;  [facility];  Service: Orthopedics;  Laterality: Right;  WITH MEDIAL MENISECTOMY  . LARYNGOSCOPY  01/2015 and 04/21/15  . ORIF LEFT RING FINGER  AND REVASCULARIZATION OF RADIAL SIDE  02-16-2007   DEGLOVING INJURY  . [place] cath    . PORTACATH PLACEMENT  Removed 06/20/3015   as of 05-27-15 remains right chest. 02-16-16 Port has been removed now.  Marland Kitchen ROTATOR CUFF REPAIR  2008   LEFT SHOULDER  . SHOULDER OPEN ROTATOR CUFF REPAIR Right 06/05/2014   Procedure: RIGHT ROTATOR CUFF REPAIR SHOULDER OPEN;  Surgeon: Latanya Maudlin, MD;  Location: [facility] ORS;  Service: Orthopedics;  Laterality: Right;  . TONSILLECTOMY     as child       Home Medications    Prior to Admission medications   Medication Sig Start Date End Date Taking? Authorizing Provider  aspirin 325 MG tablet Place 650 mg into feeding tube every 6 (six) hours as needed for mild pain.    Yes Historical Provider, MD  b complex vitamins tablet Place 1 tablet into feeding tube daily.    Yes Historical Provider, MD  dicyclomine (BENTYL) 20 MG tablet Place 20 mg into feeding tube every 6 (six) hours as needed (for abdominal cramps).    Yes Historical Provider, MD  diphenhydramine-acetaminophen (TYLENOL PM) 25-500 MG TABS tablet Place 1 tablet into feeding tube at bedtime as needed (for sleep).    Yes Historical Provider, MD  diphenoxylate-atropine (LOMOTIL) 2.5-0.025 MG tablet Place 2 tablets into feeding tube 4 (four) times daily as needed for diarrhea or loose stools.   Yes Historical Provider, MD  hydrOXYzine (ATARAX/VISTARIL) 25 MG tablet Place 25 mg into feeding tube 3 (three) times daily as needed for itching.    Yes Historical Provider, MD  LORazepam (ATIVAN) 1 MG tablet Take 0.5-1 mg by mouth every 4 (four) hours as needed for  anxiety.   Yes Historical Provider, MD  midodrine (PROAMATINE) 5 MG tablet Place 5 mg into feeding tube 3 (three) times daily with meals.    Yes Historical Provider, MD  Multiple Vitamin (MULTIVITAMIN WITH MINERALS) TABS tablet Place 1 tablet into feeding tube daily.    Yes Historical Provider, MD  mupirocin ointment (BACTROBAN) 2 % Apply 1 application topically 2 (two) times daily.    Yes Historical Provider, MD  Nutritional Supplements (FEEDING SUPPLEMENT, OSMOLITE 1.5 CAL,) LIQD Place 237 mLs into feeding tube 3 (three) times daily.    Yes Historical Provider, MD  Nutritional Supplements (TWOCAL HN) LIQD Place 237 mLs into feeding tube 3 (three) times daily.   Yes Historical Provider, MD  omeprazole (PRILOSEC) 40 MG capsule 40 mg daily as needed (per tube for acid reflux).    Yes Historical Provider, MD  ondansetron (ZOFRAN) 8 MG tablet Place 8 mg into feeding tube every 8 (eight) hours as needed for nausea or vomiting.    Yes Historical Provider, MD  promethazine (PHENERGAN) 25 MG tablet Place 25 mg into feeding tube every 6 (six) hours as needed for nausea or vomiting.    Yes Historical Provider, MD  senna (SENOKOT) 8.6 MG tablet Place 1 tablet into feeding tube daily as needed for constipation.    Yes Historical Provider, MD  triamcinolone ointment (KENALOG) 0.1 % Apply 1 application topically 2 (two) times daily. Pt applies to both feet.   Yes Historical Provider, MD  vitamin C (ASCORBIC ACID) 500 MG tablet Place 500 mg into feeding tube daily.    Yes Historical Provider, MD    Family History Family History  Problem Relation Age of Onset  . Congestive Heart Failure Mother   . Cancer Sister     colon cancer  . Cancer Maternal Grandfather   . Colon cancer Father 76  . Lung cancer Father   . Heart attack Brother   . Colon cancer    . Heart attack    . Cancer    . Hypertension    . Hyperlipidemia      Social History Social History  Substance Use Topics  . Smoking status: Former  Smoker    Packs/day: 2.00    Years: 20.00    Types: Cigarettes    Quit date: 03/17/1981  . Smokeless tobacco: Never Used  . Alcohol use 4.8 oz/week    1 Cans of beer, 7 Standard drinks or equivalent per week     Comment: rare     Allergies   Augmentin [amoxicillin-pot clavulanate]; Fentanyl; Scopolamine; Zolpidem tartrate; Morphine and related; Carboplatin; and Levaquin [levofloxacin]   Review of Systems Review of Systems Ten systems are reviewed and are negative for acute change except as noted in the  HPI   Physical Exam Updated Vital Signs BP 118/68 (BP Location: Left Arm)   Pulse 80   Temp 98.2 F (36.8 C) (Oral)   Resp 16   Ht '5\' 7"'$  (1.702 m)   Wt 137 lb (62.1 kg)   SpO2 97%   BMI 21.46 kg/m   Physical Exam  Constitutional: He is oriented to person, place, and time. He appears well-developed and well-nourished. No distress.  HENT:  Head: Normocephalic and atraumatic.  Nose: Nose normal.  Eyes: Conjunctivae and EOM are normal. Pupils are equal, round, and reactive to light. Right eye exhibits no discharge. Left eye exhibits no discharge. No scleral icterus.  Neck: Normal range of motion. Neck supple.  Cardiovascular: Normal rate and regular rhythm.  Exam reveals no gallop and no friction rub.   No murmur heard. Pulmonary/Chest: Effort normal and breath sounds normal. No stridor. No respiratory distress. He has no rales.  Abdominal: Soft. He exhibits no distension. There is tenderness (mild discomfort) in the periumbilical area. There is no rigidity, no rebound and no CVA tenderness.    Musculoskeletal: He exhibits no edema or tenderness.  Neurological: He is alert and oriented to person, place, and time.  Skin: Skin is warm and dry. No rash noted. He is not diaphoretic. No erythema.  jaundice  Psychiatric: He has a normal mood and affect.  Vitals reviewed.    ED Treatments / Results  Labs (all labs ordered are listed, but only abnormal results are  displayed) Labs Reviewed  URINALYSIS, ROUTINE W REFLEX MICROSCOPIC - Abnormal; Notable for the following:       Result Value   Color, Urine AMBER (*)    All other components within normal limits  BILIRUBIN, DIRECT - Abnormal; Notable for the following:    Bilirubin, Direct 6.8 (*)    All other components within normal limits  URINE CULTURE  GASTROINTESTINAL PANEL BY PCR, STOOL (REPLACES STOOL CULTURE)  LIPASE, BLOOD  CBC  COMPREHENSIVE METABOLIC PANEL  I-STAT CG4 LACTIC ACID, ED    EKG  EKG Interpretation None       Radiology Dg Chest 2 View  Result Date: 03/19/2016 CLINICAL DATA:  Chills jaundice and dehydration EXAM: CHEST  2 VIEW COMPARISON:  10/21/2015, 01/20/2016 . FINDINGS: No acute consolidation or pleural effusion is visualized. Volume loss in the right upper lobe with paramediastinal fibrosis, similar compared to previous CT. Stable heart size. Atherosclerosis. No pneumothorax. Drain in the right upper quadrant IMPRESSION: 1. No acute infiltrate or consolidation 2. Stable right upper lobe paramediastinal opacity compatible with scarring and volume loss Electronically Signed   By: Donavan Foil M.D.   On: 03/19/2016 17:38   Ir Exchange Biliary Drain  Result Date: 03/19/2016 INDICATION: Occluded retracted right internal external biliary drain EXAM: IR EXCHANGE BILARY DRAIN MEDICATIONS: Patient is already on oral antibiotics. ANESTHESIA/SEDATION: None. FLUOROSCOPY TIME:  Fluoroscopy Time: 4 minutes 6 seconds (61 mGy). COMPLICATIONS: None immediate. PROCEDURE: Informed written consent was obtained from the patient after a thorough discussion of the procedural risks, benefits and alternatives. All questions were addressed. Maximal Sterile Barrier Technique was utilized including caps, mask, sterile gowns, sterile gloves, sterile drape, hand hygiene and skin antiseptic. A timeout was performed prior to the initiation of the procedure. Initial fluoroscopic imaging and injection of  the right internal external biliary drain demonstrates catheter retraction into the common bile duct. 8 [place] biliary drain is also occluded. Biliary tree is not opacified. Under sterile conditions and local anesthesia, the existing 8 [place]  biliary drain was removed over a Glidewire. Access was readvanced into the duodenum. A new 8 [place] biliary drain was advanced with the retention loop formed in the duodenum. Contrast injection confirms appropriate position and drainage of the biliary system. Catheter secured with a Prolene suture and a sterile dressing. No immediate complication. Images obtained for documentation. Patient tolerated the procedure well. IMPRESSION: Successful fluoroscopic exchange and reposition of the 8 [place] right external internal biliary drain. Electronically Signed   By: Jerilynn Mages.  Shick M.D.   On: 03/19/2016 12:35    Procedures Procedures (including critical care time)  Medications Ordered in ED Medications  piperacillin-tazobactam (ZOSYN) IVPB 3.375 g (0 g Intravenous Stopped 03/19/16 2151)    Followed by  piperacillin-tazobactam (ZOSYN) IVPB 3.375 g (not administered)  diphenoxylate-atropine (LOMOTIL) 2.5-0.025 MG per tablet 2 tablet (not administered)  LORazepam (ATIVAN) tablet 0.5-1 mg (not administered)  triamcinolone ointment (KENALOG) 0.1 % 1 application (1 application Topical Given 03/20/16 0103)  senna (SENOKOT) tablet 8.6 mg (not administered)  dicyclomine (BENTYL) tablet 20 mg (not administered)  feeding supplement (OSMOLITE 1.5 CAL) liquid 237 mL (237 mLs Per Tube New Bag/Given 03/20/16 0106)  TWOCAL HN liquid 237 mL (237 mLs Per Tube Given 03/20/16 0107)  pantoprazole (PROTONIX) EC tablet 80 mg (not administered)  mupirocin ointment (BACTROBAN) 2 % 1 application (not administered)  midodrine (PROAMATINE) tablet 5 mg (not administered)  enoxaparin (LOVENOX) injection 40 mg (40 mg Subcutaneous Given 03/20/16 0109)  acetaminophen (TYLENOL) tablet 650 mg (not  administered)    Or  acetaminophen (TYLENOL) suppository 650 mg (not administered)  ondansetron (ZOFRAN) tablet 4 mg (not administered)    Or  ondansetron (ZOFRAN) injection 4 mg (not administered)  lactated ringers infusion ( Intravenous New Bag/Given 03/20/16 0041)  oxyCODONE (Oxy IR/ROXICODONE) immediate release tablet 10 mg (10 mg Per Tube Given 03/20/16 0108)  diphenhydrAMINE (BENADRYL) injection 12.5 mg (not administered)  0.9 %  sodium chloride infusion ( Intravenous New Bag/Given 03/20/16 0040)  sodium chloride 0.9 % bolus 1,000 mL (0 mLs Intravenous Stopped 03/19/16 2006)  sodium chloride 0.9 % bolus 1,000 mL (0 mLs Intravenous Stopped 03/19/16 2124)  vancomycin (VANCOCIN) IVPB 1000 mg/200 mL premix (1,000 mg Intravenous Given 03/20/16 0102)     Initial Impression / Assessment and Plan / ED Course  I have reviewed the triage vital signs and the nursing notes.  Pertinent labs & imaging results that were available during my care of the patient were reviewed by me and considered in my medical decision making (see chart for details).  Clinical Course     Started on empiric Zosyn for possible intra-abdominal infection due to recent biliary stenting. Also will need to assess for possible C. difficile given the diarrhea. Discussed case with oncology who agreed with admission. Patient admitted to hospitalist for continued workup and management.  Final Clinical Impressions(s) / ED Diagnoses   Final diagnoses:  Jaundice  Dehydration      Fatima Blank, MD 03/20/16 [phone]

## 2016-03-19 NOTE — Assessment & Plan Note (Signed)
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  Patient now has both an internal biliary drain and an external drain that his wife has been managing.  Wife states that she flushes the external drain once daily.  She is requesting assistance in managing the external drain at home.  She also states that she needs some additional saline syringes as well.  She states that she has been in touch with the interventional radiology physicians regarding the patient's stent over this past weekend.  Patient has been suffering with chronic diarrhea for the past several days; and also has some nausea.  He states he's had no vomiting.  He also denies any recent fevers or chills.  He does complain of some mild tenderness surrounding the right upper quadrant biliary stent area.  Exam today reveals abdomen soft with bowel sounds positive in all 4 quadrants.  There is some tenderness surrounding the external biliary drain site.  There is also some mild erythema and trace edema.  Dr. [name] in to examine patient briefly as well; and he recommended that patient initiated Keflex antibiotics in case there is any mild infection secondary to the site.  Labs obtained today revealed that bilirubin remains essentially the same.  Bilirubin has only slightly improved from 8.17, down to 8.13.  Patient remains jaundiced.  This provider will order home health to manage the biliary drain and to provide saline flushes in the future. _______________________________________  Update: Patient called the cancer Center this morning stating that he apparently dislodged his external biliary drain somewhat while sleeping overnight.  He experienced some intense, sharp pain at the biliary drain insertion site; and his external bag was draining gross blood.  Patient's wife states that there were blood clots in the bag as well.  Patient also reports that he has become increasingly fatigued and weak.  He continues to  feel dehydrated; even though he has a PEG tube for nutrition and water intake.  He requires assistance with any ambulation whatsoever.  Patient's wife reports that patient has had the shaking chills off and on for the past 2 days.  She denies noting any fevers.  Of note-patient was initiated on Keflex antibiotics on 03/16/2016 for questionable mild cellulitis/infection at the right upper quadrant biliary drain insertion site.  This provider had ordered home health for biliary drain care; and they were scheduled to start today.  Patient was seen earlier this morning by interventional radiology and the biliary drain was replaced.  Patient complains of pain to the insertion site.  At this time.  Labs obtained today revealed a bilirubin has increased from 8.13 up to 9.77.  Patient also continues dehydrated with sodium now 135 and creatinine increased to 1.5.  Liver enzymes are elevated more as well.  Patient was observed with the shaking chills during exam; but, temperature when checked was 97.8.  Also, patient's white count has increased from 7.2 up to 12.6.  Since last labs obtained on Tuesday this week.  Patient will be transported to the emergency department today for further evaluation of possible impending sepsis.  Brief history.  Report were called to the emergency department charge nurse; prior to patient being transported to the emergency department via wheelchair.  Per the cancer Center nurse.  CODE STATUS: Patient should be considered a full code; since he has no advance directives in the patient's chart.

## 2016-03-19 NOTE — Progress Notes (Signed)
Received patient from Interventional Radiology s/p biliary drain replacement. Pt had inadvertently dislodged external drain, experienced bleeding and pain. Drain became clogged and was changed in IR.  Peripheral IV access obtained, Normal saline fluids initiated.  Given percocet for pain.  Pt being admitted to hospital through the ED. Report called to RN in ED per Selena Lesser, NP Transported to ED via w/c per Ailene Ravel, RN. Wife in attendance.

## 2016-03-19 NOTE — ED Notes (Signed)
Report to Al, RN on 3W

## 2016-03-19 NOTE — Progress Notes (Signed)
SYMPTOM MANAGEMENT CLINIC    Chief Complaint: Diarrhea, dehydration  HPI:  [name] 70 y.o. male diagnosed with lung cancer.  Currently undergoing Keytruda immunotherapy.    No history exists.    Review of Systems  Constitutional: Positive for malaise/fatigue and weight loss.  Gastrointestinal: Positive for diarrhea and nausea.  Neurological: Positive for weakness.  All other systems reviewed and are negative.   Past Medical History:  Diagnosis Date  . Abnormality of gait 03/12/2015  . Acute meniscal tear of knee RIGHT KNEE  . Antineoplastic chemotherapy induced anemia 03/04/2016  . ARF (acute renal failure) ([place]) 03/2015   pre-renal patient was septic. is improved now.  . Arthritis RIGHT SHOULDER  . BPH (benign prostatic hypertrophy)   . Dizziness and giddiness 04/23/2014  . Gastrostomy tube dependent ([place])    Continous feedings per Gordy Councilman tube"unable to swallow"- Infusion pump for Nutrition  . HOH (hard of hearing)    bilateral hearing aids  . Hypertension    no longer an issue. Now runs usually low- tekes med to increase blood pressure  . Immature cataract BILATERAL  . Non-small cell lung cancer ([place]) DX SEPT 2010  W/ CHEMORADIATION AT THAT TIME -- NOW  W/ METS--  CURRENTLY ON MAINTENANCE CHEMO TX  EVERY 28 DAYS   ONCOLOGIST- DR [facility]- lung cancer with metastasis.  . Orthostatic hypotension    B/P tends to run 110/70 or less -using med to raise B/P.  Marland Kitchen Pneumonia   . Radiation 01/08/09-01/21/09   Mediastinum 30 Gy x 12 fractions  . Radiation 08/19/14-09/04/14   Palliative RT Porta hepatic LN 30 Gy  . Sepsis ([place]) 03/2015  . Shortness of breath dyspnea    with exertion  . Swallowing impairment    "Unable to swallow"  . Transfusion history    last 1-2 yrs ago with chemotherapy  . Tremor 04/23/2014   Right hand    Past Surgical History:  Procedure Laterality Date  . BILATERAL EAR DRUM SURGERY  1960'S  . BILE DUCT STENT PLACEMENT    . CATARACT  EXTRACTION Bilateral   . ERCP N/A 08/09/2014   Procedure: ENDOSCOPIC RETROGRADE CHOLANGIOPANCREATOGRAPHY (ERCP);  Surgeon: Carol Ada, MD;  Location: [facility];  Service: Endoscopy;  Laterality: N/A;  . ERCP N/A 05/23/2015   Procedure: ENDOSCOPIC RETROGRADE CHOLANGIOPANCREATOGRAPHY (ERCP);  Surgeon: Carol Ada, MD;  Location: [facility] ENDOSCOPY;  Service: Endoscopy;  Laterality: N/A;  . ERCP N/A 05/30/2015   Procedure: ENDOSCOPIC RETROGRADE CHOLANGIOPANCREATOGRAPHY (ERCP);  Surgeon: Carol Ada, MD;  Location: [facility];  Service: Endoscopy;  Laterality: N/A;  . ERCP N/A 02/18/2016   Procedure: ENDOSCOPIC RETROGRADE CHOLANGIOPANCREATOGRAPHY (ERCP);  Surgeon: Carol Ada, MD;  Location: [facility];  Service: Endoscopy;  Laterality: N/A;  . ESOPHAGOGASTRODUODENOSCOPY (EGD) WITH PROPOFOL N/A 08/09/2014   Procedure: ESOPHAGOGASTRODUODENOSCOPY (EGD) WITH PROPOFOL;  Surgeon: Carol Ada, MD;  Location: [facility] ENDOSCOPY;  Service: Endoscopy;  Laterality: N/A;  . EUS N/A 08/09/2014   Procedure: UPPER ENDOSCOPIC ULTRASOUND (EUS) RADIAL;  Surgeon: Carol Ada, MD;  Location: [facility] ENDOSCOPY;  Service: Endoscopy;  Laterality: N/A;  . FINE NEEDLE ASPIRATION N/A 08/09/2014   Procedure: FINE NEEDLE ASPIRATION (FNA) LINEAR;  Surgeon: Carol Ada, MD;  Location: [facility] ENDOSCOPY;  Service: Endoscopy;  Laterality: N/A;  . GASTROSTOMY TUBE PLACEMENT  10/2014  . IR GENERIC HISTORICAL  03/02/2016   IR FLUORO RM 30-60 MIN 03/02/2016 Corrie Mckusick, DO [facility]INTER[facility]  . IR GENERIC HISTORICAL  03/12/2016   IR INT EXT BILIARY DRAIN WITH CHOLANGIOGRAM 03/12/2016 Corrie Mckusick,  DO [facility]  . IR GENERIC HISTORICAL  02/25/2016   IR RADIOLOGIST EVAL & MGMT 02/25/2016 Corrie Mckusick, DO [facility]  . IR GENERIC HISTORICAL  03/19/2016   IR EXCHANGE BILIARY DRAIN 03/19/2016 Greggory Keen, MD [facility]  . KNEE ARTHROSCOPY  12/21/2011   Procedure: ARTHROSCOPY KNEE;  Surgeon: Tobi Bastos, MD;  [facility];  Service: Orthopedics;  Laterality: Right;  WITH MEDIAL MENISECTOMY  . LARYNGOSCOPY  01/2015 and 04/21/15  . ORIF LEFT RING FINGER  AND REVASCULARIZATION OF RADIAL SIDE  02-16-2007   DEGLOVING INJURY  . [place] cath    . PORTACATH PLACEMENT  Removed 06/20/3015   as of 05-27-15 remains right chest. 02-16-16 Port has been removed now.  Marland Kitchen ROTATOR CUFF REPAIR  2008   LEFT SHOULDER  . SHOULDER OPEN ROTATOR CUFF REPAIR Right 06/05/2014   Procedure: RIGHT ROTATOR CUFF REPAIR SHOULDER OPEN;  Surgeon: Latanya Maudlin, MD;  Location: [facility];  Service: Orthopedics;  Laterality: Right;  . TONSILLECTOMY     as child    has Bronchogenic cancer of left lung ([place]); ARTHRITIS; Dyspnea; Osteoarthritis of right knee; Meniscus, medial, posterior horn derangement; Acute on chronic renal failure ([place]); Pancytopenia ([place]); Dizziness and giddiness; Tremor; Rotator cuff tear, non-traumatic; Obstructive jaundice due to cancer [facility]); CRF (chronic renal failure); Obstructive jaundice; Malnutrition of moderate degree ([place]); Abdominal mass; Fatigue; Biliary obstruction; Cancer of intra-abdominal lymph nodes, secondary ([place]); Non-small cell lung cancer ([place]); Encounter for antineoplastic immunotherapy; Dehydration; Central line complication; Anorexia; Weight loss; Transaminitis; Hyperphosphatemia; Hypoalbuminemia due to protein-calorie malnutrition ([place]); Leukocytosis; Dysphagia; Protein-calorie malnutrition, severe ([place]); Abnormality of gait; Septic shock ([place]); Sepsis ([place]); Acute kidney injury ([place]); Gram-negative bacteremia; Bacteremia due to Klebsiella pneumoniae; Infection due to Enterobacter cloacae; Chronic fatigue; Rash; Neck pain; Antineoplastic chemotherapy induced anemia; and Diarrhea on his problem list.    is allergic to augmentin [amoxicillin-pot clavulanate]; fentanyl; scopolamine; zolpidem tartrate; morphine and related; carboplatin; and levaquin [levofloxacin].  Allergies as of 03/19/2016       Reactions   Augmentin [amoxicillin-pot Clavulanate] Diarrhea, Other (See Comments)   .Has patient had a PCN reaction causing immediate rash, facial/tongue/throat swelling, SOB or lightheadedness with hypotension: No Has patient had a PCN reaction causing severe rash involving mucus membranes or skin necrosis: No Has patient had a PCN reaction that required hospitalization No Has patient had a PCN reaction occurring within the last 10 years: Yes If all of the above answers are "NO", then may proceed with Cephalosporin use.   Fentanyl Other (See Comments)   Reaction:  Hallucinations    Scopolamine Other (See Comments)   Reaction:  Hallucinations    Zolpidem Tartrate Diarrhea   Morphine And Related Itching   Carboplatin Itching, Rash   Levaquin [levofloxacin] Other (See Comments)   Reaction:  Knee pain       Medication List       Accurate as of 03/19/16  5:45 PM. Always use your most recent med list.          aspirin 325 MG tablet Place 650 mg into feeding tube every 6 (six) hours as needed for mild pain.   b complex vitamins tablet Place 1 tablet into feeding tube daily.   cephALEXin 500 MG capsule Commonly known as:  KEFLEX Place 500 mg into feeding tube 4 (four) times daily.   dicyclomine 20 MG tablet Commonly known as:  BENTYL Place 20 mg into feeding tube every 6 (six) hours as needed (for abdominal cramps).  diphenhydramine-acetaminophen 25-500 MG Tabs tablet Commonly known as:  TYLENOL PM Place 1 tablet into feeding tube at bedtime as needed (for sleep).   diphenoxylate-atropine 2.5-0.025 MG tablet Commonly known as:  LOMOTIL Place 2 tablets into feeding tube 4 (four) times daily as needed for diarrhea or loose stools.   feeding supplement (OSMOLITE 1.5 CAL) Liqd Place 237 mLs into feeding tube 3 (three) times daily.   TWOCAL HN Liqd Place 237 mLs into feeding tube 3 (three) times daily.   hydrOXYzine 25 MG tablet Commonly known as:  ATARAX/VISTARIL Place  25 mg into feeding tube 3 (three) times daily as needed for itching.   LORazepam 1 MG tablet Commonly known as:  ATIVAN Take 0.5-1 mg by mouth every 4 (four) hours as needed for anxiety.   midodrine 5 MG tablet Commonly known as:  PROAMATINE Place 5 mg into feeding tube 3 (three) times daily with meals.   multivitamin with minerals Tabs tablet Place 1 tablet into feeding tube daily.   mupirocin ointment 2 % Commonly known as:  BACTROBAN Apply 1 application topically 2 (two) times daily.   omeprazole 40 MG capsule Commonly known as:  PRILOSEC 40 mg daily as needed (per tube for acid reflux).   ondansetron 8 MG tablet Commonly known as:  ZOFRAN Place 8 mg into feeding tube every 8 (eight) hours as needed for nausea or vomiting.   promethazine 25 MG tablet Commonly known as:  PHENERGAN Place 25 mg into feeding tube every 6 (six) hours as needed for nausea or vomiting.   senna 8.6 MG tablet Commonly known as:  SENOKOT Place 1 tablet into feeding tube daily as needed for constipation.   triamcinolone ointment 0.1 % Commonly known as:  KENALOG Apply 1 application topically 2 (two) times daily. Pt applies to both feet.   vitamin C 500 MG tablet Commonly known as:  ASCORBIC ACID Place 500 mg into feeding tube daily.        PHYSICAL EXAMINATION  Oncology Vitals 03/19/2016 03/19/2016  Height 170 cm 173 cm  Weight 62.143 kg 62.324 kg  Weight (lbs) 137 lbs 137 lbs 6 oz  BMI (kg/m2) 21.46 kg/m2 20.89 kg/m2  Temp 98.2 97.6  Pulse 80 87  Resp 16 16  Resp (Historical as of 10/28/11) - -  SpO2 97 98  BSA (m2) 1.71 m2 1.73 m2   BP Readings from Last 2 Encounters:  03/19/16 118/68  03/19/16 102/64    Physical Exam  Constitutional: He is oriented to person, place, and time. He appears malnourished, dehydrated and jaundiced. He appears unhealthy. He appears cachectic.  Patient appears increasingly fatigued and weak.  HENT:  Head: Normocephalic and atraumatic.    Mouth/Throat: Oropharynx is clear and moist.  Eyes: Conjunctivae and EOM are normal. Pupils are equal, round, and reactive to light. Right eye exhibits no discharge. Left eye exhibits no discharge. Scleral icterus is present.  Increased scleral icterus and generalized jaundice.  Neck: Normal range of motion. Neck supple. No JVD present. No tracheal deviation present. No thyromegaly present.  Cardiovascular: Normal rate, regular rhythm, normal heart sounds and intact distal pulses.   Pulmonary/Chest: Effort normal and breath sounds normal. No respiratory distress. He has no wheezes. He has no rales. He exhibits no tenderness.  Abdominal: Soft. Bowel sounds are normal. He exhibits no distension and no mass. There is tenderness. There is no rebound and no guarding.  Bowel sounds positive in all 4 quads.  Abdomen was soft.  There is some mild  tenderness only to the right upper quadrant area at the site of the biliary drain.  Musculoskeletal: Normal range of motion. He exhibits no edema or deformity.  Lymphadenopathy:    He has no cervical adenopathy.  Neurological: He is alert and oriented to person, place, and time. Gait normal.  Skin: Skin is warm and dry. No rash noted. No erythema. There is pallor.  Psychiatric: Affect normal.  Nursing note and vitals reviewed.   LABORATORY DATA:. Appointment on 03/19/2016  Component Date Value Ref Range Status  . WBC 03/19/2016 12.6* 4.0 - 10.3 10e3/uL Final  . NEUT# 03/19/2016 10.2* 1.5 - 6.5 10e3/uL Final  . HGB 03/19/2016 11.0* 13.0 - 17.1 g/dL Final  . HCT 03/19/2016 31.4* 38.4 - 49.9 % Final  . Platelets 03/19/2016 149  140 - 400 10e3/uL Final  . MCV 03/19/2016 101.9* 79.3 - 98.0 fL Final  . MCH 03/19/2016 35.7* 27.2 - 33.4 pg Final  . MCHC 03/19/2016 35.0  32.0 - 36.0 g/dL Final  . RBC 03/19/2016 3.08* 4.20 - 5.82 10e6/uL Final  . RDW 03/19/2016 14.3  11.0 - 14.6 % Final  . lymph# 03/19/2016 0.8* 0.9 - 3.3 10e3/uL Final  . MONO# 03/19/2016  1.4* 0.1 - 0.9 10e3/uL Final  . Eosinophils Absolute 03/19/2016 0.2  0.0 - 0.5 10e3/uL Final  . Basophils Absolute 03/19/2016 0.0  0.0 - 0.1 10e3/uL Final  . NEUT% 03/19/2016 80.7* 39.0 - 75.0 % Final  . LYMPH% 03/19/2016 6.5* 14.0 - 49.0 % Final  . MONO% 03/19/2016 10.9  0.0 - 14.0 % Final  . EOS% 03/19/2016 1.7  0.0 - 7.0 % Final  . BASO% 03/19/2016 0.2  0.0 - 2.0 % Final  . Sodium 03/19/2016 135* 136 - 145 mEq/L Final  . Potassium 03/19/2016 4.3  3.5 - 5.1 mEq/L Final  . Chloride 03/19/2016 105  98 - 109 mEq/L Final  . CO2 03/19/2016 20* 22 - 29 mEq/L Final  . Glucose 03/19/2016 100  70 - 140 mg/dl Final  . BUN 03/19/2016 50.1* 7.0 - 26.0 mg/dL Final  . Creatinine 03/19/2016 1.5* 0.7 - 1.3 mg/dL Final  . Total Bilirubin 03/19/2016 9.77* 0.20 - 1.20 mg/dL Final  . Alkaline Phosphatase 03/19/2016 266* 40 - 150 U/L Final  . AST 03/19/2016 97* 5 - 34 U/L Final  . ALT 03/19/2016 100* 0 - 55 U/L Final  . Total Protein 03/19/2016 6.4  6.4 - 8.3 g/dL Final  . Albumin 03/19/2016 2.7* 3.5 - 5.0 g/dL Final  . Calcium 03/19/2016 9.1  8.4 - 10.4 mg/dL Final  . Anion Gap 03/19/2016 10  3 - 11 mEq/L Final  . EGFR 03/19/2016 47* >90 ml/min/1.73 m2 Final  . TSH 03/19/2016 2.964  0.320 - 4.118 m(IU)/L Final    RADIOGRAPHIC STUDIES: Dg Chest 2 View  Result Date: 03/19/2016 CLINICAL DATA:  Chills jaundice and dehydration EXAM: CHEST  2 VIEW COMPARISON:  10/21/2015, 01/20/2016 . FINDINGS: No acute consolidation or pleural effusion is visualized. Volume loss in the right upper lobe with paramediastinal fibrosis, similar compared to previous CT. Stable heart size. Atherosclerosis. No pneumothorax. Drain in the right upper quadrant IMPRESSION: 1. No acute infiltrate or consolidation 2. Stable right upper lobe paramediastinal opacity compatible with scarring and volume loss Electronically Signed   By: Donavan Foil M.D.   On: 03/19/2016 17:38   Ir Exchange Biliary Drain  Result Date:  03/19/2016 INDICATION: Occluded retracted right internal external biliary drain EXAM: IR EXCHANGE BILARY DRAIN MEDICATIONS: Patient is already on  oral antibiotics. ANESTHESIA/SEDATION: None. FLUOROSCOPY TIME:  Fluoroscopy Time: 4 minutes 6 seconds (61 mGy). COMPLICATIONS: None immediate. PROCEDURE: Informed written consent was obtained from the patient after a thorough discussion of the procedural risks, benefits and alternatives. All questions were addressed. Maximal Sterile Barrier Technique was utilized including caps, mask, sterile gowns, sterile gloves, sterile drape, hand hygiene and skin antiseptic. A timeout was performed prior to the initiation of the procedure. Initial fluoroscopic imaging and injection of the right internal external biliary drain demonstrates catheter retraction into the common bile duct. 8 [place] biliary drain is also occluded. Biliary tree is not opacified. Under sterile conditions and local anesthesia, the existing 8 [place] biliary drain was removed over a Glidewire. Access was readvanced into the duodenum. A new 8 [place] biliary drain was advanced with the retention loop formed in the duodenum. Contrast injection confirms appropriate position and drainage of the biliary system. Catheter secured with a Prolene suture and a sterile dressing. No immediate complication. Images obtained for documentation. Patient tolerated the procedure well. IMPRESSION: Successful fluoroscopic exchange and reposition of the 8 [place] right external internal biliary drain. Electronically Signed   By: Jerilynn Mages.  Shick M.D.   On: 03/19/2016 12:35    ASSESSMENT/PLAN:    Dehydration Patient returned to the [facility] today to receive additional IV fluid rehydration.  He appears increasingly fatigued and weak today.  See further notes for details of today's visit.  Bronchogenic cancer of left lung [facility]) Patient received his last Keytruda on 01/22/2016.  All further immunotherapy has been held secondary to  his increased bilirubin and biliary drain procedures.  Patient has plans to return for IV fluid rehydration on Friday, 03/19/2016.  Will also add labs and a symptom management clinic visit for next Wednesday, 03/24/2016.  He is scheduled for labs, follow up visit, and his next cycle of immunotherapy on 04/01/2016.  ________________________________  Update: Patient presented back to the [facility] today to receive IV fluid rehydration.  See further notes for details of today's visit.  Patient was transported to the emergency department this afternoon for further evaluation of possible pending sepsis.  Biliary obstruction Patient underwent a revision of his biliary stent/drain on Friday, 03/12/2016.  Patient now has both an internal biliary drain and an external drain that his wife has been managing.  Wife states that she flushes the external drain once daily.  She is requesting assistance in managing the external drain at home.  She also states that she needs some additional saline syringes as well.  She states that she has been in touch with the interventional radiology physicians regarding the patient's stent over this past weekend.  Patient has been suffering with chronic diarrhea for the past several days; and also has some nausea.  He states he's had no vomiting.  He also denies any recent fevers or chills.  He does complain of some mild tenderness surrounding the right upper quadrant biliary stent area.  Exam today reveals abdomen soft with bowel sounds positive in all 4 quadrants.  There is some tenderness surrounding the external biliary drain site.  There is also some mild erythema and trace edema.  Dr. [name] in to examine patient briefly as well; and he recommended that patient initiated Keflex antibiotics in case there is any mild infection secondary to the site.  Labs obtained today revealed that bilirubin remains essentially the same.  Bilirubin has only slightly improved from  8.17, down to 8.13.  Patient remains jaundiced.  This provider will order  home health to manage the biliary drain and to provide saline flushes in the future. _______________________________________  Update: Patient called the cancer Center this morning stating that he apparently dislodged his external biliary drain somewhat while sleeping overnight.  He experienced some intense, sharp pain at the biliary drain insertion site; and his external bag was draining gross blood.  Patient's wife states that there were blood clots in the bag as well.  Patient also reports that he has become increasingly fatigued and weak.  He continues to feel dehydrated; even though he has a PEG tube for nutrition and water intake.  He requires assistance with any ambulation whatsoever.  Patient's wife reports that patient has had the shaking chills off and on for the past 2 days.  She denies noting any fevers.  Of note-patient was initiated on Keflex antibiotics on 03/16/2016 for questionable mild cellulitis/infection at the right upper quadrant biliary drain insertion site.  This provider had ordered home health for biliary drain care; and they were scheduled to start today.  Patient was seen earlier this morning by interventional radiology and the biliary drain was replaced.  Patient complains of pain to the insertion site.  At this time.  Labs obtained today revealed a bilirubin has increased from 8.13 up to 9.77.  Patient also continues dehydrated with sodium now 135 and creatinine increased to 1.5.  Liver enzymes are elevated more as well.  Patient was observed with the shaking chills during exam; but, temperature when checked was 97.8.  Also, patient's white count has increased from 7.2 up to 12.6.  Since last labs obtained on Tuesday this week.  Patient will be transported to the emergency department today for further evaluation of possible impending sepsis.  Brief history.  Report were called to the emergency  department charge nurse; prior to patient being transported to the emergency department via wheelchair.  Per the cancer Center nurse.  CODE STATUS: Patient should be considered a full code; since he has no advance directives in the patient's chart.     Patient stated understanding of all instructions; and was in agreement with this plan of care. The patient knows to call the clinic with any problems, questions or concerns.   Total time spent with patient was 40 minutes;  with greater than 75 percent of that time spent in face to face counseling regarding patient's symptoms,  and coordination of care and follow up.  Disclaimer:This dictation was prepared with Dragon/digital dictation along with [employer]. Any transcriptional errors that result from this process are unintentional.  Drue Second, NP 03/19/2016    Disclaimer: This note was dictated with voice recognition software. Similar sounding words can inadvertently be transcribed and may be missed upon review. Drue Second, NP 03/19/16

## 2016-03-19 NOTE — Assessment & Plan Note (Signed)
Patient returned to the [facility] today to receive additional IV fluid rehydration.  He appears increasingly fatigued and weak today.  See further notes for details of today's visit.

## 2016-03-19 NOTE — Procedures (Signed)
S/P EXCHG AND REPOSITION OF THE RT BILIARY DRAIN  NO comp Stable Keep to gravity bag during the day, may cap at night EBL 0

## 2016-03-19 NOTE — H&P (Signed)
History and Physical    [name] YIR:[id number] DOB: 1945/11/14 DOA: 03/19/2016  PCP: [name], MD Consultants:  [name] - oncology; GI - Collene Mares; IR - Yamagata/Wagner Patient coming from: home - lives with wife: NOK: patient currently names his wife, [phone]  Chief Complaint: worsening jaundice  HPI: [name] is a 70 y.o. 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  He has had worsening jaundice and pruritis with refractory bilirubin elevation despite stent placement.   Has was started on Keytruda on 11/20/15 but his last dose was 01/22/16 due to progressive disease, bilirubin was increasing.  Has bile duct stent which was dislodged. Stent replaced on 11/22.  Bilirubin continued to climb, looked again and stent still wasn't functioning.  IR was involved. 12/15 - internal and external drains placed by IR.  12/16 - chills, vomiting, diarrhea.  Talked to Dr. Kathlene Cote and were instructed to hook up external drain.  Stent was patent but maybe not draining enough.  Semed to improve but by Monday 12/18, he was getting dehydrated.  Went in to [facility] on Tuesday 12/19, labs abnormal, dehydrated, given IVF.  Bili unchanged but LFTs better.  Started on Keflex for skin infection at site of the new drain.  Also anti-diarrhea medication.  Wednesday, he felt great. Drank fluids, regular feedings, had ongoing diarrhea but improved.  Felt good yesterday but about 5:20pm he redeveloped shaking and chills, no fever, BP ok.  Finally better after about 40 minutes, wife giving lots of fluids through PEG tube - maybe 2L overnight.  Called and scheduled an appt for IVF this AM.  Last night with increased urination.  About 0230, he bumped into something and knocked the drain site.   This AM about 0800, there was a lot of gross blood in the bag and the tube was clogged with blood from the drain.  Ongoing diarrhea overnight and through the day today.  Scheduled to come to [place] at 10, but told to go to IR first.  He had dislodged the external drain and it was plugged.  They replaced the drain.  Went to the [facility] about 1230, repeat labs showed elevated WBC count, more dehydrated, they gave 3/4L IVF and oxycodone for pain and recommended coming to the ER for sepsis (has h/o septic shock).   ED Course: 1L NS bolus x 2; Zosyn; Oncology consult  Review of Systems: As per HPI; otherwise 10 point review of systems reviewed and negative.   Ambulatory Status:  Ambulates with cane - balance is a little off, he is a little weak  Past Medical History:  Diagnosis Date  . Abnormality of gait 03/12/2015  . Acute meniscal tear of knee RIGHT KNEE  . Antineoplastic chemotherapy induced anemia 03/04/2016  . ARF (acute renal failure) ([place]) 03/2015   pre-renal patient was septic. is improved now.  . Arthritis RIGHT SHOULDER  . BPH (benign prostatic hypertrophy)   . Dizziness and giddiness 04/23/2014  . Gastrostomy tube dependent ([place])    Continous feedings per Gordy Councilman tube"unable to swallow"- Infusion pump for Nutrition  . HOH (hard of hearing)    bilateral hearing aids  . Hypertension    no longer an issue. Now runs usually low- tekes med to increase blood pressure  . Immature cataract BILATERAL  . Non-small  cell lung cancer ([place]) DX SEPT 2010  W/ CHEMORADIATION AT THAT TIME -- NOW  W/ METS--  CURRENTLY ON MAINTENANCE CHEMO TX  EVERY 77 DAYS   ONCOLOGIST- [facility]- lung cancer with metastasis.  . Orthostatic hypotension    B/P tends to run 110/70 or less -using med to raise B/P.  Marland Kitchen Pneumonia   . Radiation 01/08/09-01/21/09   Mediastinum 30 Gy x 12 fractions  . Radiation 08/19/14-09/04/14   Palliative RT Porta hepatic LN 30 Gy  . Sepsis ([place]) 03/2015  . Shortness of  breath dyspnea    with exertion  . Swallowing impairment    "Unable to swallow"  . Transfusion history    last 1-2 yrs ago with chemotherapy  . Tremor 04/23/2014   Right hand    Past Surgical History:  Procedure Laterality Date  . BILATERAL EAR DRUM SURGERY  1960'S  . BILE DUCT STENT PLACEMENT    . CATARACT EXTRACTION Bilateral   . ERCP N/A 08/09/2014   Procedure: ENDOSCOPIC RETROGRADE CHOLANGIOPANCREATOGRAPHY (ERCP);  Surgeon: Carol Ada, MD;  Location: [facility];  Service: Endoscopy;  Laterality: N/A;  . ERCP N/A 05/23/2015   Procedure: ENDOSCOPIC RETROGRADE CHOLANGIOPANCREATOGRAPHY (ERCP);  Surgeon: Carol Ada, MD;  Location: [facility] ENDOSCOPY;  Service: Endoscopy;  Laterality: N/A;  . ERCP N/A 05/30/2015   Procedure: ENDOSCOPIC RETROGRADE CHOLANGIOPANCREATOGRAPHY (ERCP);  Surgeon: Carol Ada, MD;  Location: [facility];  Service: Endoscopy;  Laterality: N/A;  . ERCP N/A 02/18/2016   Procedure: ENDOSCOPIC RETROGRADE CHOLANGIOPANCREATOGRAPHY (ERCP);  Surgeon: Carol Ada, MD;  Location: [facility];  Service: Endoscopy;  Laterality: N/A;  . ESOPHAGOGASTRODUODENOSCOPY (EGD) WITH PROPOFOL N/A 08/09/2014   Procedure: ESOPHAGOGASTRODUODENOSCOPY (EGD) WITH PROPOFOL;  Surgeon: Carol Ada, MD;  Location: [facility] ENDOSCOPY;  Service: Endoscopy;  Laterality: N/A;  . EUS N/A 08/09/2014   Procedure: UPPER ENDOSCOPIC ULTRASOUND (EUS) RADIAL;  Surgeon: Carol Ada, MD;  Location: [facility] ENDOSCOPY;  Service: Endoscopy;  Laterality: N/A;  . FINE NEEDLE ASPIRATION N/A 08/09/2014   Procedure: FINE NEEDLE ASPIRATION (FNA) LINEAR;  Surgeon: Carol Ada, MD;  Location: [facility] ENDOSCOPY;  Service: Endoscopy;  Laterality: N/A;  . GASTROSTOMY TUBE PLACEMENT  10/2014  . IR GENERIC HISTORICAL  03/02/2016   IR FLUORO RM 30-60 MIN 03/02/2016 Corrie Mckusick, DO [facility]INTER[facility]  . IR GENERIC HISTORICAL  03/12/2016   IR INT EXT BILIARY DRAIN WITH CHOLANGIOGRAM 03/12/2016 Corrie Mckusick, DO [facility]  . IR GENERIC HISTORICAL   02/25/2016   IR RADIOLOGIST EVAL & MGMT 02/25/2016 Corrie Mckusick, DO [facility]  . IR GENERIC HISTORICAL  03/19/2016   IR EXCHANGE BILIARY DRAIN 03/19/2016 Greggory Keen, MD [facility]  . KNEE ARTHROSCOPY  12/21/2011   Procedure: ARTHROSCOPY KNEE;  Surgeon: Tobi Bastos, MD;  Location: [facility];  Service: Orthopedics;  Laterality: Right;  WITH MEDIAL MENISECTOMY  . LARYNGOSCOPY  01/2015 and 04/21/15  . ORIF LEFT RING FINGER  AND REVASCULARIZATION OF RADIAL SIDE  02-16-2007   DEGLOVING INJURY  . Rodriguez Hevia cath    . PORTACATH PLACEMENT  Removed 06/20/3015   as of 05-27-15 remains right chest. 02-16-16 Port has been removed now.  Marland Kitchen ROTATOR CUFF REPAIR  2008   LEFT SHOULDER  . SHOULDER OPEN ROTATOR CUFF REPAIR Right 06/05/2014   Procedure: RIGHT ROTATOR CUFF REPAIR SHOULDER OPEN;  Surgeon: Latanya Maudlin, MD;  Location: [facility];  Service: Orthopedics;  Laterality: Right;  . TONSILLECTOMY     as child    Social History   Social History  .  Marital status: Married    Spouse name: N/A  . Number of children: 3  . Years of education: college   Occupational History  . 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 Smoking status: Former Smoker    Packs/day: 2.00    Years: 20.00    Types: Cigarettes    Quit date: 03/17/1981  . Smokeless tobacco: Never Used  . Alcohol use 4.8 oz/week    1 Cans of beer, 7 Standard drinks or equivalent per week     Comment: rare  . Drug use: No  . Sexual activity: Not Currently   Other Topics Concern  . Not on file   Social History Narrative   Lives with his wife, [name]   Patient is right handed.   Patient drinks 1-2 cups caffeine daily.    Allergies  Allergen Reactions  . Augmentin [Amoxicillin-Pot Clavulanate] Diarrhea and Other (See Comments)    .Has patient had a PCN reaction causing immediate rash, facial/tongue/throat swelling, SOB or lightheadedness with hypotension: No Has patient  had a PCN reaction causing severe rash involving mucus membranes or skin necrosis: No Has patient had a PCN reaction that required hospitalization No Has patient had a PCN reaction occurring within the last 10 years: Yes If all of the above answers are "NO", then may proceed with Cephalosporin use.   . Fentanyl Other (See Comments)    Reaction:  Hallucinations   . Scopolamine Other (See Comments)    Reaction:  Hallucinations   . Zolpidem Tartrate Diarrhea  . Morphine And Related Itching  . Carboplatin Itching and Rash  . Levaquin [Levofloxacin] Other (See Comments)    Reaction:  Knee pain     Family History  Problem Relation Age of Onset  . Congestive Heart Failure Mother   . Cancer Sister     colon cancer  . Cancer Maternal Grandfather   . Colon cancer Father 52  . Lung cancer Father   . Heart attack Brother   . Colon cancer    . Heart attack    . Cancer    . Hypertension    . Hyperlipidemia      Prior to Admission medications   Medication Sig Start Date End Date Taking? Authorizing Provider  aspirin 325 MG tablet Place 650 mg into feeding tube every 6 (six) hours as needed for mild pain.    Yes Historical Provider, MD  b complex vitamins tablet Place 1 tablet into feeding tube daily.    Yes Historical Provider, MD  cephALEXin (KEFLEX) 500 MG capsule Place 500 mg into feeding tube 4 (four) times daily. 03/16/16 03/26/16 Yes Historical Provider, MD  dicyclomine (BENTYL) 20 MG tablet Place 20 mg into feeding tube every 6 (six) hours as needed (for abdominal cramps).    Yes Historical Provider, MD  diphenhydramine-acetaminophen (TYLENOL PM) 25-500 MG TABS tablet Place 1 tablet into feeding tube at bedtime as needed (for sleep).    Yes Historical Provider, MD  diphenoxylate-atropine (LOMOTIL) 2.5-0.025 MG tablet Place 2 tablets into feeding tube 4 (four) times daily as needed for diarrhea or loose stools.   Yes Historical Provider, MD  hydrOXYzine (ATARAX/VISTARIL) 25 MG tablet  Place 25 mg into feeding tube 3 (three) times daily as needed for itching.    Yes Historical Provider, MD  LORazepam (ATIVAN) 1 MG tablet Take 0.5-1 mg by mouth every 4 (four) hours as needed for anxiety.   Yes Historical Provider, MD  midodrine (PROAMATINE) 5 MG tablet Place 5 mg into feeding tube 3 (three) times daily with meals.    Yes Historical Provider, MD  Multiple Vitamin (MULTIVITAMIN WITH MINERALS) TABS tablet Place 1 tablet into feeding tube daily.    Yes Historical Provider, MD  mupirocin ointment (BACTROBAN) 2 % Apply 1 application topically 2 (two) times daily.    Yes Historical Provider, MD  Nutritional Supplements (FEEDING SUPPLEMENT, OSMOLITE 1.5 CAL,) LIQD Place 237 mLs into feeding tube 3 (three) times daily.    Yes Historical Provider, MD  Nutritional Supplements (TWOCAL HN) LIQD Place 237 mLs into feeding tube 3 (three) times daily.   Yes Historical Provider, MD  omeprazole (PRILOSEC) 40 MG capsule 40 mg daily as needed (per tube for acid reflux).    Yes Historical Provider, MD  ondansetron (ZOFRAN) 8 MG tablet Place 8 mg into feeding tube every 8 (eight) hours as needed for nausea or vomiting.    Yes Historical Provider, MD  promethazine (PHENERGAN) 25 MG tablet Place 25 mg into feeding tube every 6 (six) hours as needed for nausea or vomiting.    Yes Historical Provider, MD  senna (SENOKOT) 8.6 MG tablet Place 1 tablet into feeding tube daily as needed for constipation.    Yes Historical Provider, MD  triamcinolone ointment (KENALOG) 0.1 % Apply 1 application topically 2 (two) times daily. Pt applies to both feet.   Yes Historical Provider, MD  vitamin C (ASCORBIC ACID) 500 MG tablet Place 500 mg into feeding tube daily.    Yes Historical Provider, MD    Physical Exam: Vitals:   03/19/16 2000 03/19/16 2001 03/19/16 2030 03/19/16 2200  BP: 108/65 108/65 114/69 119/66  Pulse: 86 89 85 91  Resp: (!) 29 18 11 18   Temp:  98.5 F (36.9 C)  98.1 F (36.7 C)  TempSrc:  Oral   Oral  SpO2: 98% 98% 99% 100%  Weight:    62.3 kg (137 lb 6.4 oz)  Height:    5' 7"  (1.702 m)     General: Appears quite jaundiced, fatigued, chronically ill Eyes:  PERRL, EOMI, normal lids, iris; +marked scleral icterus ENT:  grossly normal hearing, lips & tongue, mmm Neck:  no LAD, masses or thyromegaly Cardiovascular:  RRR, no m/r/g. No LE edema.  Respiratory:  CTA bilaterally, no w/r/r. Normal respiratory effort. Abdomen:  Distended, PEG tube with granulation tissue and some purulence at the base with mild surrounding erythema; biliary drain is in place in right abdomen, dressings in place but ER physician notes mild surrounding hyperemia.  Patient with significant TTP with light touch to the area. Skin:  no rash or induration seen on limited exam other than as above; diffuse jaundice Musculoskeletal: grossly normal tone BUE/BLE, good ROM, no bony abnormality Psychiatric: blunted affect, speech fluent and appropriate, AOx3 Neurologic:  CN 2-12 grossly intact, moves all extremities in coordinated fashion, sensation intact  Labs on Admission: I have personally reviewed following labs and imaging studies  CBC:  Recent Labs Lab 03/16/16 1114 03/19/16 1232  WBC 7.2 12.6*  NEUTROABS 5.7 10.2*  HGB 11.6* 11.0*  HCT 34.8* 31.4*  MCV 105.9* 101.9*  PLT 134* 341   Basic Metabolic Panel:  Recent Labs Lab 03/16/16 1114 03/19/16 1232  NA 138 135*  K 4.8 4.3  CO2 22 20*  GLUCOSE 128 100  BUN 30.5* 50.1*  CREATININE 1.1 1.5*  CALCIUM 9.5 9.1   GFR: Estimated Creatinine Clearance: 39.8 mL/min (by C-G formula based on SCr of  1.5 mg/dL (H)). Liver Function Tests:  Recent Labs Lab 03/16/16 1114 03/19/16 1232  AST 67* 97*  ALT 96* 100*  ALKPHOS 239* 266*  BILITOT 8.13* 9.77*  PROT 6.6 6.4  ALBUMIN 2.5* 2.7*    Recent Labs Lab 03/19/16 1805  LIPASE 13   No results for input(s): AMMONIA in the last 168 hours. Coagulation Profile: No results for input(s): INR,  PROTIME in the last 168 hours. Cardiac Enzymes: No results for input(s): CKTOTAL, CKMB, CKMBINDEX, TROPONINI in the last 168 hours. BNP (last 3 results) No results for input(s): PROBNP in the last 8760 hours. HbA1C: No results for input(s): HGBA1C in the last 72 hours. CBG: No results for input(s): GLUCAP in the last 168 hours. Lipid Profile: No results for input(s): CHOL, HDL, LDLCALC, TRIG, CHOLHDL, LDLDIRECT in the last 72 hours. Thyroid Function Tests:  Recent Labs  03/19/16 1233  TSH 2.964   Anemia Panel: No results for input(s): VITAMINB12, FOLATE, FERRITIN, TIBC, IRON, RETICCTPCT in the last 72 hours. Urine analysis:    Component Value Date/Time   COLORURINE AMBER (A) 03/19/2016 1747   APPEARANCEUR CLEAR 03/19/2016 1747   LABSPEC 1.010 03/19/2016 1747   PHURINE 6.0 03/19/2016 1747   GLUCOSEU NEGATIVE 03/19/2016 1747   HGBUR NEGATIVE 03/19/2016 1747   BILIRUBINUR NEGATIVE 03/19/2016 1747   KETONESUR NEGATIVE 03/19/2016 1747   PROTEINUR NEGATIVE 03/19/2016 1747   UROBILINOGEN 0.2 01/28/2015 1000   NITRITE NEGATIVE 03/19/2016 1747   LEUKOCYTESUR NEGATIVE 03/19/2016 1747    Creatinine Clearance: Estimated Creatinine Clearance: 39.8 mL/min (by C-G formula based on SCr of 1.5 mg/dL (H)).  Sepsis Labs: @LABRCNTIP (procalcitonin:4,lacticidven:4) )No results found for this or any previous visit (from the past 240 hour(s)).   Radiological Exams on Admission: Dg Chest 2 View  Result Date: 03/19/2016 CLINICAL DATA:  Chills jaundice and dehydration EXAM: CHEST  2 VIEW COMPARISON:  10/21/2015, 01/20/2016 . FINDINGS: No acute consolidation or pleural effusion is visualized. Volume loss in the right upper lobe with paramediastinal fibrosis, similar compared to previous CT. Stable heart size. Atherosclerosis. No pneumothorax. Drain in the right upper quadrant IMPRESSION: 1. No acute infiltrate or consolidation 2. Stable right upper lobe paramediastinal opacity compatible with  scarring and volume loss Electronically Signed   By: Donavan Foil M.D.   On: 03/19/2016 17:38   Ir Exchange Biliary Drain  Result Date: 03/19/2016 INDICATION: Occluded retracted right internal external biliary drain EXAM: IR EXCHANGE BILARY DRAIN MEDICATIONS: Patient is already on oral antibiotics. ANESTHESIA/SEDATION: None. FLUOROSCOPY TIME:  Fluoroscopy Time: 4 minutes 6 seconds (61 mGy). COMPLICATIONS: None immediate. PROCEDURE: Informed written consent was obtained from the patient after a thorough discussion of the procedural risks, benefits and alternatives. All questions were addressed. Maximal Sterile Barrier Technique was utilized including caps, mask, sterile gowns, sterile gloves, sterile drape, hand hygiene and skin antiseptic. A timeout was performed prior to the initiation of the procedure. Initial fluoroscopic imaging and injection of the right internal external biliary drain demonstrates catheter retraction into the common bile duct. 8 [place] biliary drain is also occluded. Biliary tree is not opacified. Under sterile conditions and local anesthesia, the existing 8 [place] biliary drain was removed over a Glidewire. Access was readvanced into the duodenum. A new 8 [place] biliary drain was advanced with the retention loop formed in the duodenum. Contrast injection confirms appropriate position and drainage of the biliary system. Catheter secured with a Prolene suture and a sterile dressing. No immediate complication. Images obtained for documentation. Patient tolerated the procedure well.  IMPRESSION: Successful fluoroscopic exchange and reposition of the 8 [place] right external internal biliary drain. Electronically Signed   By: Jerilynn Mages.  Shick M.D.   On: 03/19/2016 12:35    EKG: not done  Assessment/Plan Principal Problem:   Biliary obstruction Active Problems:   Bronchogenic cancer of left lung (HCC)   Obstructive jaundice due to cancer (HCC)   Malnutrition of moderate degree (HCC)    Leukocytosis   Acute kidney injury ([place])   Biliary obstruction resulting from complications of non-small cell lung cancer, metastatic -Patient has had ongoing difficulties with this issue -Despite placement of biliary drains multiple times now, his LFTs and bilirubin continue to climb:  Bili 9.77; 8.13 on 12/19; 8.17 on 12/13; 2.31 on 8/14  AP 266; 239 on 12/19; 388 on 12/13  AST 97; 67 on 12/19; 105 on 12/13  ALT 100; 96 on 12/19; 121 on 12/13 -He also has leukocytosis today.  This may be the result of a biliary drain infection - which would frankly be best-case scenario because then it might not be terminal. -Will treat with Zosyn and Vanc for now. -Wound care consult. -Will request IR and Onc consults tonight; suggest GI consultation tomorrow as well. 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 -Meanwhile, it seems more likely that his malignancy is not responding to treatment and that this is his terminal problem. -If he is not improving in the next 24-48 hours, would suggest serious consideration of hospice (inpatient vs. At-home), as he likely only has a few more weeks left (at best). -It appeared that he was at peace with this; however, his wife appears to be in denial about the inevitable outcome.  Malnutrition -Albumin 2.7; 2.5 on 12/19; 3.1 on 12/13 -This is despite tube feeding -will request nutrition consult  AKI -BUN 50; 30.5 on 12/19; 31.8 on 12/13 -Creatinine 1.5; 1.1 on 12/19 and 12/13 -Will rehydrate and also continue tube feeds  Anemia -Hgb 11; 11.6 on 12/19; 12.0 on 12/13 -Will trend    DVT prophylaxis:  Lovenox  Code Status: DNR - confirmed with patient/family Family Communication: Wife present throughout evaluation  Disposition Plan: To be  determined Consults called: Onc (by ER), IR (order placed); GI (day team to call)  Admission status: Admit - It is my clinical opinion that admission to INPATIENT is reasonable and necessary because this patient will require at least 2 midnights in the hospital to treat this condition based on the medical complexity of the problems presented.  Given the aforementioned information, the predictability of an adverse outcome is felt to be significant.    Karmen Bongo MD [facility]  If 7PM-7AM, please contact night-coverage [email or web address] Password [email or web address]  03/20/2016, 1:52 AM

## 2016-03-20 DIAGNOSIS — C3492 Malignant neoplasm of unspecified part of left bronchus or lung: Secondary | ICD-10-CM

## 2016-03-20 DIAGNOSIS — C801 Malignant (primary) neoplasm, unspecified: Secondary | ICD-10-CM

## 2016-03-20 DIAGNOSIS — E440 Moderate protein-calorie malnutrition: Secondary | ICD-10-CM

## 2016-03-20 DIAGNOSIS — N179 Acute kidney failure, unspecified: Secondary | ICD-10-CM

## 2016-03-20 LAB — GASTROINTESTINAL PANEL BY PCR, STOOL (REPLACES STOOL CULTURE)
Adenovirus F40/41: NOT DETECTED
Astrovirus: NOT DETECTED
CYCLOSPORA CAYETANENSIS: NOT DETECTED
Campylobacter species: NOT DETECTED
Cryptosporidium: NOT DETECTED
ENTAMOEBA HISTOLYTICA: NOT DETECTED
ENTEROAGGREGATIVE E COLI (EAEC): NOT DETECTED
Enteropathogenic E coli (EPEC): NOT DETECTED
Enterotoxigenic E coli (ETEC): NOT DETECTED
GIARDIA LAMBLIA: NOT DETECTED
Norovirus GI/GII: NOT DETECTED
Plesimonas shigelloides: NOT DETECTED
Rotavirus A: NOT DETECTED
SAPOVIRUS (I, II, IV, AND V): NOT DETECTED
SHIGA LIKE TOXIN PRODUCING E COLI (STEC): NOT DETECTED
SHIGELLA/ENTEROINVASIVE E COLI (EIEC): NOT DETECTED
Salmonella species: NOT DETECTED
VIBRIO CHOLERAE: NOT DETECTED
VIBRIO SPECIES: NOT DETECTED
YERSINIA ENTEROCOLITICA: NOT DETECTED

## 2016-03-20 LAB — COMPREHENSIVE METABOLIC PANEL
ALT: 83 U/L — ABNORMAL HIGH (ref 17–63)
AST: 93 U/L — AB (ref 15–41)
Albumin: 2.5 g/dL — ABNORMAL LOW (ref 3.5–5.0)
Alkaline Phosphatase: 236 U/L — ABNORMAL HIGH (ref 38–126)
Anion gap: 7 (ref 5–15)
BUN: 42 mg/dL — ABNORMAL HIGH (ref 6–20)
CHLORIDE: 116 mmol/L — AB (ref 101–111)
CO2: 18 mmol/L — ABNORMAL LOW (ref 22–32)
Calcium: 8.1 mg/dL — ABNORMAL LOW (ref 8.9–10.3)
Creatinine, Ser: 1.36 mg/dL — ABNORMAL HIGH (ref 0.61–1.24)
GFR calc Af Amer: 59 mL/min — ABNORMAL LOW (ref >60)
GFR, EST NON AFRICAN AMERICAN: 51 mL/min — AB (ref >60)
Glucose, Bld: 143 mg/dL — ABNORMAL HIGH (ref 65–99)
POTASSIUM: 3.9 mmol/L (ref 3.5–5.1)
Sodium: 141 mmol/L (ref 135–145)
Total Bilirubin: 10.1 mg/dL — ABNORMAL HIGH (ref 0.3–1.2)
Total Protein: 5 g/dL — ABNORMAL LOW (ref 6.5–8.1)

## 2016-03-20 LAB — CBC
HCT: 26 % — ABNORMAL LOW (ref 39.0–52.0)
Hemoglobin: 9.1 g/dL — ABNORMAL LOW (ref 13.0–17.0)
MCH: 35.5 pg — ABNORMAL HIGH (ref 26.0–34.0)
MCHC: 35 g/dL (ref 30.0–36.0)
MCV: 101.6 fL — AB (ref 78.0–100.0)
PLATELETS: 113 10*3/uL — AB (ref 150–400)
RBC: 2.56 MIL/uL — ABNORMAL LOW (ref 4.22–5.81)
RDW: 14.6 % (ref 11.5–15.5)
WBC: 6.8 10*3/uL (ref 4.0–10.5)

## 2016-03-20 MED ORDER — SODIUM CHLORIDE 0.9 % IV SOLN
500.0000 mg | Freq: Two times a day (BID) | INTRAVENOUS | Status: DC
  Administered 2016-03-20 – 2016-03-21 (×2): 500 mg via INTRAVENOUS
  Filled 2016-03-20 (×3): qty 500

## 2016-03-20 NOTE — Progress Notes (Signed)
PROGRESS NOTE  [name]  VHQ:[id number] DOB: 09-03-1945 DOA: 03/19/2016 PCP: [name], MD  Brief Narrative:   The patient is a 70 year old male with history of metastatic non-small cell lung cancer with biliary obstruction.  He was started on Keytruda in August 2017, but had progression of disease despite therapy.  Over the last two months, he has had rising bilirubin.  His common bile duct stent was replaced in November due to concern that it was dislodged, but despite exchange, his bilirubin continued to climb.  IR was consulted to place a percutaneous biliary drain on 12/15.  Since then, his bilirubin has continued to rise gradually.  He had a course of keflex for possible cellulitis surrounding his drain nad he has required IVF at the cancer center due to dehydration.  On the day of admission, he must have pulled his biliary drain during his sleep and he developed pain in the RUQ with blood in his drain.  His drain was exchanged at IR on 12/22 and he was given IVF at the cancer center but they were concerned for possible sepsis so he was directly admitted to the hospital for treatment.  He was started on broad-spectrum antibiotics.  Chest x-ray was unremarkable and urinalysis was negative.  Urine culture is pending. I have ordered a sample of his bile to be sent to microbiology for culture. Patient is clinically feeling better after IV fluids and antibiotics. Will await the results of cultures for 24 hours, however if tomorrow he is continuing to feel well and cultures are negative, will discharge him on a course of oral antibiotics with close outpatient follow-up at the cancer center for reevaluation in a week.     Assessment & Plan:   Principal Problem:   Biliary obstruction Active Problems:   Bronchogenic cancer of left lung (HCC)   Obstructive jaundice due to cancer [facility])   Malnutrition of moderate degree (HCC)   Leukocytosis   Acute kidney injury ([place])  Biliary  obstruction resulting from complications of non-small cell lung cancer, metastatic - Bilirubin rose slightly again today -  Case discussed with interventional radiology, Dr. Reesa Chew, he stated that because the patient does not have significant biliary dilation and because he suspects that the common bile duct drain is still somewhat patent, he will likely not have much drainage through his biliary drain and his bilirubin may not drop dramatically despite the exchange on 12/22. 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  Diarrhea, 5-7 times per day present for the last 3 weeks -  GI pathogen panel pending -  I suspect that his diarrhea is related to his common bile duct drain  Malnutrition, continue tube feeds -advance oral diet for comfort  AKI, improved but creatinine not back to baseline, baseline creatinine 1.1, trending down from 1.5 at on the date of admission - continue IVF  Leukocytosis, improved with IVF and antibiotics  Macrocytic anemia due to liver disease and due to hemorrhage from perc bili drain yesterday -  Hemoglobin dropped 2 g/dL over last 24 hours -  Repeat hemoglobin in a.m.  DVT prophylaxis:  Lovenox Code Status:  DO NOT RESUSCITATE Family Communication:  Patient alone Disposition Plan:  Anticipate home on 12/24 with close outpatient follow-up with oncology   Consultants:   Interventional radiology, assistance appreciated  Procedures:  Percutaneous biliary drain exchanged on 12/22  Antimicrobials:  Anti-infectives    Start     Dose/Rate Route Frequency Ordered Stop   03/20/16 1800  vancomycin (VANCOCIN) 500 mg in sodium chloride 0.9 % 100 mL IVPB     500 mg 100 mL/hr over 60 Minutes Intravenous Every 12 hours 03/20/16 0525     03/20/16 0200   piperacillin-tazobactam (ZOSYN) IVPB 3.375 g     3.375 g 12.5 mL/hr over 240 Minutes Intravenous Every 8 hours 03/19/16 2015     03/19/16 2245  vancomycin (VANCOCIN) IVPB 1000 mg/200 mL premix     1,000 mg 200 mL/hr over 60 Minutes Intravenous  Once 03/19/16 2240 03/20/16 0202   03/19/16 2030  piperacillin-tazobactam (ZOSYN) IVPB 3.375 g     3.375 g 100 mL/hr over 30 Minutes Intravenous  Once 03/19/16 2015 03/19/16 2151       Subjective:  Feeling much better, energy improved. Still having watery diarrhea about 5-7 times per day, small volume.  Mouth feels dry and he would like to try to eat something.  Objective: Vitals:   03/19/16 2001 03/19/16 2030 03/19/16 2200 03/20/16 0529  BP: 108/65 114/69 119/66 (!) 91/55  Pulse: 89 85 91 (!) 103  Resp: '18 11 18 17  '$ Temp: 98.5 F (36.9 C)  98.1 F (36.7 C) 98.5 F (36.9 C)  TempSrc: Oral  Oral Oral  SpO2: 98% 99% 100% 97%  Weight:   62.3 kg (137 lb 6.4 oz)   Height:   '5\' 7"'$  (1.702 m)     Intake/Output Summary (Last 24 hours) at 03/20/16 1314 Last data filed at 03/20/16 0419  Gross per 24 hour  Intake             3224 ml  Output                0 ml  Net             3224 ml   Filed Weights   03/19/16 1559 03/19/16 1747 03/19/16 2200  Weight: 62.1 kg (137 lb) 61 kg (134 lb 6.4 oz) 62.3 kg (137 lb 6.4 oz)    Examination:  General exam:  Adult Male.  No acute distress.  HEENT:  NCAT, MMM Respiratory system: Clear to auscultation bilaterally Cardiovascular system: Regular rate and rhythm, normal S1/S2. No murmurs, rubs, gallops or clicks.  Warm extremities Gastrointestinal system: Normal active bowel sounds, soft, moderately distended, tender to palpation in the right upper quadrant and epigastric area without rebound or guarding MSK:  Normal tone and bulk, no lower extremity edema Neuro:  Grossly intact    Data Reviewed: I have personally reviewed following labs and imaging studies  CBC:  Recent Labs Lab 03/16/16 1114  03/19/16 1232 03/20/16 0440  WBC 7.2 12.6* 6.8  NEUTROABS 5.7 10.2*  --   HGB 11.6* 11.0* 9.1*  HCT 34.8* 31.4* 26.0*  MCV 105.9* 101.9* 101.6*  PLT 134* 149 734*   Basic Metabolic Panel:  Recent Labs Lab 03/16/16 1114 03/19/16 1232 03/20/16 0440  NA 138 135* 141  K 4.8 4.3 3.9  CL  --   --  116*  CO2 22 20* 18*  GLUCOSE 128 100 143*  BUN 30.5* 50.1* 42*  CREATININE 1.1 1.5* 1.36*  CALCIUM 9.5 9.1 8.1*   GFR: Estimated Creatinine Clearance: 43.9 mL/min (by C-G formula based on SCr of 1.36 mg/dL (H)). Liver Function Tests:  Recent Labs Lab 03/16/16 1114 03/19/16 1232 03/20/16 0440  AST 67* 97*  93*  ALT 96* 100* 83*  ALKPHOS 239* 266* 236*  BILITOT 8.13* 9.77* 10.1*  PROT 6.6 6.4 5.0*  ALBUMIN 2.5* 2.7* 2.5*    Recent Labs Lab 03/19/16 1805  LIPASE 13   No results for input(s): AMMONIA in the last 168 hours. Coagulation Profile: No results for input(s): INR, PROTIME in the last 168 hours. Cardiac Enzymes: No results for input(s): CKTOTAL, CKMB, CKMBINDEX, TROPONINI in the last 168 hours. BNP (last 3 results) No results for input(s): PROBNP in the last 8760 hours. HbA1C: No results for input(s): HGBA1C in the last 72 hours. CBG: No results for input(s): GLUCAP in the last 168 hours. Lipid Profile: No results for input(s): CHOL, HDL, LDLCALC, TRIG, CHOLHDL, LDLDIRECT in the last 72 hours. Thyroid Function Tests:  Recent Labs  03/19/16 1233  TSH 2.964   Anemia Panel: No results for input(s): VITAMINB12, FOLATE, FERRITIN, TIBC, IRON, RETICCTPCT in the last 72 hours. Urine analysis:    Component Value Date/Time   COLORURINE AMBER (A) 03/19/2016 1747   APPEARANCEUR CLEAR 03/19/2016 1747   LABSPEC 1.010 03/19/2016 1747   PHURINE 6.0 03/19/2016 1747   GLUCOSEU NEGATIVE 03/19/2016 1747   HGBUR NEGATIVE 03/19/2016 1747   BILIRUBINUR NEGATIVE 03/19/2016 1747   KETONESUR NEGATIVE 03/19/2016 1747   PROTEINUR NEGATIVE 03/19/2016 1747   UROBILINOGEN  0.2 01/28/2015 1000   NITRITE NEGATIVE 03/19/2016 1747   LEUKOCYTESUR NEGATIVE 03/19/2016 1747   Sepsis Labs: '@LABRCNTIP'$ (procalcitonin:4,lacticidven:4)  )No results found for this or any previous visit (from the past 240 hour(s)).    Radiology Studies: Dg Chest 2 View  Result Date: 03/19/2016 CLINICAL DATA:  Chills jaundice and dehydration EXAM: CHEST  2 VIEW COMPARISON:  10/21/2015, 01/20/2016 . FINDINGS: No acute consolidation or pleural effusion is visualized. Volume loss in the right upper lobe with paramediastinal fibrosis, similar compared to previous CT. Stable heart size. Atherosclerosis. No pneumothorax. Drain in the right upper quadrant IMPRESSION: 1. No acute infiltrate or consolidation 2. Stable right upper lobe paramediastinal opacity compatible with scarring and volume loss Electronically Signed   By: Donavan Foil M.D.   On: 03/19/2016 17:38   Ir Exchange Biliary Drain  Result Date: 03/19/2016 INDICATION: Occluded retracted right internal external biliary drain EXAM: IR EXCHANGE BILARY DRAIN MEDICATIONS: Patient is already on oral antibiotics. ANESTHESIA/SEDATION: None. FLUOROSCOPY TIME:  Fluoroscopy Time: 4 minutes 6 seconds (61 mGy). COMPLICATIONS: None immediate. PROCEDURE: Informed written consent was obtained from the patient after a thorough discussion of the procedural risks, benefits and alternatives. All questions were addressed. Maximal Sterile Barrier Technique was utilized including caps, mask, sterile gowns, sterile gloves, sterile drape, hand hygiene and skin antiseptic. A timeout was performed prior to the initiation of the procedure. Initial fluoroscopic imaging and injection of the right internal external biliary drain demonstrates catheter retraction into the common bile duct. 8 [place] biliary drain is also occluded. Biliary tree is not opacified. Under sterile conditions and local anesthesia, the existing 8 [place] biliary drain was removed over a Glidewire. Access  was readvanced into the duodenum. A new 8 [place] biliary drain was advanced with the retention loop formed in the duodenum. Contrast injection confirms appropriate position and drainage of the biliary system. Catheter secured with a Prolene suture and a sterile dressing. No immediate complication. Images obtained for documentation. Patient tolerated the procedure well. IMPRESSION: Successful fluoroscopic exchange and reposition of the 8 [place] right external internal biliary drain. Electronically Signed   By: Jerilynn Mages.  Shick M.D.   On: 03/19/2016 12:35  Scheduled Meds: . enoxaparin (LOVENOX) injection  40 mg Subcutaneous Q24H  . feeding supplement (OSMOLITE 1.5 CAL)  237 mL Per Tube TID  . midodrine  5 mg Per Tube TID WC  . mupirocin ointment  1 application Topical BID  . oxyCODONE  10 mg Per Tube Q4H  . pantoprazole  80 mg Oral Daily  . piperacillin-tazobactam (ZOSYN)  IV  3.375 g Intravenous Q8H  . triamcinolone ointment  1 application Topical BID  . TWOCAL HN  237 mL Per Tube TID  . vancomycin  500 mg Intravenous Q12H   Continuous Infusions: . lactated ringers 75 mL/hr at 03/20/16 0419     LOS: 1 day    Time spent: 30 min    Janece Canterbury, MD [facility] Pager [phone]  If 7PM-7AM, please contact night-coverage [email or web address] Password [email or web address] 03/20/2016, 1:14 PM

## 2016-03-20 NOTE — Progress Notes (Signed)
Referring Physician(s): D M Short  Supervising Physician: Marybelle Killings  Patient Status:  [facility] - In-pt  Chief Complaint:  Biliary drain  Exchanged 03/20/16   Subjective:   Hx Metastatic [name] lung Ca Biliary obstruction first noted 07/2014 requiring PTC and drain placed in IR ERCP stent placement followed Has had plastic and metal stents throughout his illness. 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 Most recent metal stent placed 02/18/2016 - ERCP Pt still with rising Bilirubin labs; pruritis; and jaundice Consulted with Dr Rolla Plate 03/09/16  New I/E biliary drain placed 03/12/16 Exchanged secondary occlusion 03/19/16  Request for IR to evaluate pt for any further procedure/plan Continued rise in bilirubin 12/22 9.7; 12/23 10.1    Allergies: Augmentin [amoxicillin-pot clavulanate]; Fentanyl; Scopolamine; Zolpidem tartrate; Morphine and related; Carboplatin; and Levaquin [levofloxacin]  Medications: Prior to Admission medications   Medication Sig Start Date End Date Taking? Authorizing Provider  aspirin 325 MG tablet Place 650 mg into feeding tube every 6 (six) hours as needed for mild pain.    Yes Historical Provider, MD  b complex vitamins tablet Place 1 tablet into feeding tube daily.    Yes Historical Provider, MD  dicyclomine (BENTYL) 20 MG tablet Place 20 mg into feeding tube every 6 (six) hours as needed (for abdominal cramps).    Yes Historical Provider, MD  diphenhydramine-acetaminophen (TYLENOL PM) 25-500 MG TABS tablet Place 1 tablet into feeding tube at bedtime as needed (for sleep).    Yes Historical Provider, MD  diphenoxylate-atropine (LOMOTIL) 2.5-0.025 MG tablet Place 2 tablets into feeding tube 4 (four) times daily as needed for diarrhea or loose stools.   Yes Historical Provider, MD  hydrOXYzine (ATARAX/VISTARIL) 25 MG tablet Place 25 mg into feeding tube 3 (three) times daily as needed for itching.    Yes Historical Provider, MD    LORazepam (ATIVAN) 1 MG tablet Take 0.5-1 mg by mouth every 4 (four) hours as needed for anxiety.   Yes Historical Provider, MD  midodrine (PROAMATINE) 5 MG tablet Place 5 mg into feeding tube 3 (three) times daily with meals.    Yes Historical Provider, MD  Multiple Vitamin (MULTIVITAMIN WITH MINERALS) TABS tablet Place 1 tablet into feeding tube daily.    Yes Historical Provider, MD  mupirocin ointment (BACTROBAN) 2 % Apply 1 application topically 2 (two) times daily.    Yes Historical Provider, MD  Nutritional Supplements (FEEDING SUPPLEMENT, OSMOLITE 1.5 CAL,) LIQD Place 237 mLs into feeding tube 3 (three) times daily.    Yes Historical Provider, MD  Nutritional Supplements (TWOCAL HN) LIQD Place 237 mLs into feeding tube 3 (three) times daily.   Yes Historical Provider, MD  omeprazole (PRILOSEC) 40 MG capsule 40 mg daily as needed (per tube for acid reflux).    Yes Historical Provider, MD  ondansetron (ZOFRAN) 8 MG tablet Place 8 mg into feeding tube every 8 (eight) hours as needed for nausea or vomiting.    Yes Historical Provider, MD  promethazine (PHENERGAN) 25 MG tablet Place 25 mg into feeding tube every 6 (six) hours as needed for nausea or vomiting.    Yes Historical Provider, MD  senna (SENOKOT) 8.6 MG tablet Place 1 tablet into feeding tube daily as needed for constipation.    Yes Historical Provider, MD  triamcinolone ointment (KENALOG) 0.1 % Apply 1 application topically 2 (two) times daily. Pt applies to both feet.   Yes Historical Provider, MD  vitamin C (ASCORBIC ACID) 500 MG tablet Place 500 mg into  feeding tube daily.    Yes Historical Provider, MD     Vital Signs: BP (!) 91/55 (BP Location: Left Arm)   Pulse (!) 103   Temp 98.5 F (36.9 C) (Oral)   Resp 17   Ht 5' 7"  (1.702 m)   Wt 137 lb 6.4 oz (62.3 kg) Comment: per wife wt this Am at the cancer center. no bed scale   SpO2 97%   BMI 21.52 kg/m   Physical Exam  Pulmonary/Chest: Effort normal and breath sounds  normal.  Abdominal: Soft. Bowel sounds are normal.  Musculoskeletal: Normal range of motion.  Neurological: He is alert.  Skin: Skin is warm and dry.  Site of drain is clean and dry NT no bleeding No sign of infection Output 30 cc in bag- bilious fluid  Flushed with 20 cc sterile saline Flushes well; no pain Aspirated well   Psychiatric: He has a normal mood and affect. His behavior is normal.  Nursing note and vitals reviewed.   Imaging: Dg Chest 2 View  Result Date: 03/19/2016 CLINICAL DATA:  Chills jaundice and dehydration EXAM: CHEST  2 VIEW COMPARISON:  10/21/2015, 01/20/2016 . FINDINGS: No acute consolidation or pleural effusion is visualized. Volume loss in the right upper lobe with paramediastinal fibrosis, similar compared to previous CT. Stable heart size. Atherosclerosis. No pneumothorax. Drain in the right upper quadrant IMPRESSION: 1. No acute infiltrate or consolidation 2. Stable right upper lobe paramediastinal opacity compatible with scarring and volume loss Electronically Signed   By: Donavan Foil M.D.   On: 03/19/2016 17:38   Ir Exchange Biliary Drain  Result Date: 03/19/2016 INDICATION: Occluded retracted right internal external biliary drain EXAM: IR EXCHANGE BILARY DRAIN MEDICATIONS: Patient is already on oral antibiotics. ANESTHESIA/SEDATION: None. FLUOROSCOPY TIME:  Fluoroscopy Time: 4 minutes 6 seconds (61 mGy). COMPLICATIONS: None immediate. PROCEDURE: Informed written consent was obtained from the patient after a thorough discussion of the procedural risks, benefits and alternatives. All questions were addressed. Maximal Sterile Barrier Technique was utilized including caps, mask, sterile gowns, sterile gloves, sterile drape, hand hygiene and skin antiseptic. A timeout was performed prior to the initiation of the procedure. Initial fluoroscopic imaging and injection of the right internal external biliary drain demonstrates catheter retraction into the common bile  duct. 8 [place] biliary drain is also occluded. Biliary tree is not opacified. Under sterile conditions and local anesthesia, the existing 8 [place] biliary drain was removed over a Glidewire. Access was readvanced into the duodenum. A new 8 [place] biliary drain was advanced with the retention loop formed in the duodenum. Contrast injection confirms appropriate position and drainage of the biliary system. Catheter secured with a Prolene suture and a sterile dressing. No immediate complication. Images obtained for documentation. Patient tolerated the procedure well. IMPRESSION: Successful fluoroscopic exchange and reposition of the 8 [place] right external internal biliary drain. Electronically Signed   By: Jerilynn Mages.  Shick M.D.   On: 03/19/2016 12:35    Labs:  CBC:  Recent Labs  03/10/16 1351 03/16/16 1114 03/19/16 1232 03/20/16 0440  WBC 8.5 7.2 12.6* 6.8  HGB 12.0* 11.6* 11.0* 9.1*  HCT 35.9* 34.8* 31.4* 26.0*  PLT 132* 134* 149 113*    COAGS:  Recent Labs  06/20/15 0735 09/08/15 0901 03/12/16 1214  INR 1.10 1.22 1.28  APTT 30 25 33    BMP:  Recent Labs  06/20/15 0735  10/21/15 0922 10/26/15 1715  03/10/16 1351 03/16/16 1114 03/19/16 1232 03/20/16 0440  NA 139  < >  136 135  < > 136 138 135* 141  K 4.7  < > 4.1 4.3  < > 4.2 4.8 4.3 3.9  CL 102  --  107 104  --   --   --   --  116*  CO2 26  < > 21* 21*  < > 22 22 20* 18*  GLUCOSE 117*  < > 138* 108*  < > 101 128 100 143*  BUN 31*  < > 33* 34*  < > 31.8* 30.5* 50.1* 42*  CALCIUM 10.5*  < > 9.0 9.4  < > 9.3 9.5 9.1 8.1*  CREATININE 1.13  < > 1.06 1.19  < > 1.1 1.1 1.5* 1.36*  GFRNONAA >60  --  >60 >60  --   --   --   --  51*  GFRAA >60  --  >60 >60  --   --   --   --  59*  < > = values in this interval not displayed.  LIVER FUNCTION TESTS:  Recent Labs  03/10/16 1351 03/16/16 1114 03/19/16 1232 03/20/16 0440  BILITOT 8.17* 8.13* 9.77* 10.1*  AST 105* 67* 97* 93*  ALT 121* 96* 100* 83*  ALKPHOS 388* 239* 266* 236*    PROT 6.6 6.6 6.4 5.0*  ALBUMIN 3.1* 2.5* 2.7* 2.5*    Assessment and Plan:  Metastatic NSC lung ca  Biliary obstruction/jaundice Biliary drain placed 12/15- exchanged 12/22 Draining well; flushing well Discussed with Dr Barbie Banner No further plan for procedure in IR  Per Dr [name] note 12/5: PLAN: The patient will be observing his future appointments with Oncology. We will plan on exchanging the internal external drain in 2-3 months. At that time, it may be useful to attempt a foreign body retrieval through a sheath, with attempt at removal of the of the stent.   Electronically Signed: Monia Sabal A 03/20/2016, 11:13 AM   I spent a total of 25 Minutes at the the patient's bedside AND on the patient's hospital floor or unit, greater than 50% of which was counseling/coordinating care for biliary drain

## 2016-03-20 NOTE — Progress Notes (Signed)
Pharmacy Antibiotic Note  [name] is a 70 y.o. male admitted on 03/19/2016 with Wound infection/drain site/sepsis.  Pharmacy has been consulted for Vancomycin dosing.  Plan: Vancomycin '500mg'$  IV every 12 hours.  Goal trough 15-20 mcg/mL.  Height: '5\' 7"'$  (170.2 cm) Weight: 137 lb 6.4 oz (62.3 kg) (per wife wt this Am at the cancer center. no bed scale ) IBW/kg (Calculated) : 66.1  Temp (24hrs), Avg:98.1 F (36.7 C), Min:97.6 F (36.4 C), Max:98.5 F (36.9 C)   Recent Labs Lab 03/16/16 1114 03/19/16 1232 03/19/16 1816  WBC 7.2 12.6*  --   CREATININE 1.1 1.5*  --   LATICACIDVEN  --   --  1.80    Estimated Creatinine Clearance: 39.8 mL/min (by C-G formula based on SCr of 1.5 mg/dL (H)).    Allergies  Allergen Reactions  . Augmentin [Amoxicillin-Pot Clavulanate] Diarrhea and Other (See Comments)    .Has patient had a PCN reaction causing immediate rash, facial/tongue/throat swelling, SOB or lightheadedness with hypotension: No Has patient had a PCN reaction causing severe rash involving mucus membranes or skin necrosis: No Has patient had a PCN reaction that required hospitalization No Has patient had a PCN reaction occurring within the last 10 years: Yes If all of the above answers are "NO", then may proceed with Cephalosporin use.   . Fentanyl Other (See Comments)    Reaction:  Hallucinations   . Scopolamine Other (See Comments)    Reaction:  Hallucinations   . Zolpidem Tartrate Diarrhea  . Morphine And Related Itching  . Carboplatin Itching and Rash  . Levaquin [Levofloxacin] Other (See Comments)    Reaction:  Knee pain     Antimicrobials this admission: Vancomycin 03/20/2016 >> Zosyn 03/19/2016 >>   Dose adjustments this admission: -  Microbiology results: pending  Thank you for allowing pharmacy to be a part of this patient's care.  [name] 03/20/2016 5:29 AM

## 2016-03-21 ENCOUNTER — Inpatient Hospital Stay (HOSPITAL_COMMUNITY): Payer: Medicare Other

## 2016-03-21 DIAGNOSIS — K830 Cholangitis: Secondary | ICD-10-CM

## 2016-03-21 DIAGNOSIS — K8309 Other cholangitis: Secondary | ICD-10-CM

## 2016-03-21 LAB — URINE CULTURE: CULTURE: NO GROWTH

## 2016-03-21 LAB — COMPREHENSIVE METABOLIC PANEL
ALK PHOS: 328 U/L — AB (ref 38–126)
ALT: 100 U/L — ABNORMAL HIGH (ref 17–63)
ANION GAP: 8 (ref 5–15)
AST: 118 U/L — ABNORMAL HIGH (ref 15–41)
Albumin: 2.5 g/dL — ABNORMAL LOW (ref 3.5–5.0)
BUN: 31 mg/dL — ABNORMAL HIGH (ref 6–20)
CALCIUM: 8.1 mg/dL — AB (ref 8.9–10.3)
CO2: 19 mmol/L — ABNORMAL LOW (ref 22–32)
Chloride: 113 mmol/L — ABNORMAL HIGH (ref 101–111)
Creatinine, Ser: 1.06 mg/dL (ref 0.61–1.24)
GFR calc non Af Amer: >60 mL/min (ref >60)
Glucose, Bld: 154 mg/dL — ABNORMAL HIGH (ref 65–99)
POTASSIUM: 4.2 mmol/L (ref 3.5–5.1)
Sodium: 140 mmol/L (ref 135–145)
Total Bilirubin: 10.2 mg/dL — ABNORMAL HIGH (ref 0.3–1.2)
Total Protein: 5.2 g/dL — ABNORMAL LOW (ref 6.5–8.1)

## 2016-03-21 LAB — CBC
HCT: 27.2 % — ABNORMAL LOW (ref 39.0–52.0)
HEMOGLOBIN: 9.4 g/dL — AB (ref 13.0–17.0)
MCH: 35.2 pg — ABNORMAL HIGH (ref 26.0–34.0)
MCHC: 34.6 g/dL (ref 30.0–36.0)
MCV: 101.9 fL — ABNORMAL HIGH (ref 78.0–100.0)
Platelets: 117 10*3/uL — ABNORMAL LOW (ref 150–400)
RBC: 2.67 MIL/uL — AB (ref 4.22–5.81)
RDW: 14.9 % (ref 11.5–15.5)
WBC: 6.3 10*3/uL (ref 4.0–10.5)

## 2016-03-21 LAB — AMMONIA: Ammonia: <9 umol/L — ABNORMAL LOW (ref 9–35)

## 2016-03-21 MED ORDER — AMOXICILLIN-POT CLAVULANATE 875-125 MG PO TABS: 1.0000 | ORAL_TABLET | Freq: Two times a day (BID) | ORAL | 0 refills | Status: DC

## 2016-03-21 MED ORDER — OXYCODONE HCL 5 MG/5ML PO SOLN: 10.0000 mg | ORAL | 0 refills | Status: DC | PRN

## 2016-03-21 MED ORDER — SIMETHICONE 40 MG/0.6ML PO SUSP: 80.0000 mg | Freq: Four times a day (QID) | ORAL | 0 refills | Status: DC

## 2016-03-21 MED ORDER — METOCLOPRAMIDE HCL 5 MG/5ML PO SOLN: 5.0000 mg | Freq: Four times a day (QID) | ORAL | 0 refills | Status: DC | PRN

## 2016-03-21 MED ORDER — SIMETHICONE 40 MG/0.6ML PO SUSP
80.0000 mg | Freq: Four times a day (QID) | ORAL | Status: DC
  Filled 2016-03-21 (×2): qty 1.2

## 2016-03-21 MED ORDER — OSMOLITE 1.5 CAL PO LIQD
237.0000 mL | Freq: Three times a day (TID) | ORAL | Status: DC
  Filled 2016-03-21 (×2): qty 237

## 2016-03-21 MED ORDER — METOCLOPRAMIDE HCL 5 MG/5ML PO SOLN
5.0000 mg | Freq: Three times a day (TID) | ORAL | Status: DC
  Filled 2016-03-21: qty 5

## 2016-03-21 NOTE — Progress Notes (Signed)
Pharmacy Antibiotic Note  [name] is a 70 y.o. male with metastatic non-small cell lung cancer and biliary obstruction (s/p perc biliary drain on 12/15, drain exchanged on 12/22), presented to the ED on 12/22 after drain exchanged for suspected sepsis.  Patient was started on vancomycin and zosyn on admission.  Today, 03/21/2016: - afeb, wbc down wnl - scr trending down 1.06 (crcl~56)  Plan: - with renal function improving and GPC noted in bile fluid culture, will check vancomycin trough level with AM dose on 12/25 to assess clearance - continue with zosyn 3.375 gm IV q8h (infuse over 4 hrs) and vancomycin 500 mg IV q12h for now - monitor renal funct closely  ______________________________  Height: '5\' 7"'$  (170.2 cm) Weight: 137 lb 6.4 oz (62.3 kg) (per wife wt this Am at the cancer center. no bed scale ) IBW/kg (Calculated) : 66.1  Temp (24hrs), Avg:98.5 F (36.9 C), Min:98.2 F (36.8 C), Max:98.6 F (37 C)   Recent Labs Lab 03/16/16 1114 03/19/16 1232 03/19/16 1816 03/20/16 0440 03/21/16 0422  WBC 7.2 12.6*  --  6.8 6.3  CREATININE 1.1 1.5*  --  1.36* 1.06  LATICACIDVEN  --   --  1.80  --   --     Estimated Creatinine Clearance: 56.3 mL/min (by C-G formula based on SCr of 1.06 mg/dL).    Allergies  Allergen Reactions  . Augmentin [Amoxicillin-Pot Clavulanate] Diarrhea and Other (See Comments)    .Has patient had a PCN reaction causing immediate rash, facial/tongue/throat swelling, SOB or lightheadedness with hypotension: No Has patient had a PCN reaction causing severe rash involving mucus membranes or skin necrosis: No Has patient had a PCN reaction that required hospitalization No Has patient had a PCN reaction occurring within the last 10 years: Yes If all of the above answers are "NO", then may proceed with Cephalosporin use.   . Fentanyl Other (See Comments)    Reaction:  Hallucinations   . Scopolamine Other (See Comments)    Reaction:  Hallucinations    . Zolpidem Tartrate Diarrhea  . Morphine And Related Itching  . Carboplatin Itching and Rash  . Levaquin [Levofloxacin] Other (See Comments)    Reaction:  Knee pain     Antimicrobials this admission:  12/23 Vancomycin >> 12/22 Zosyn >>   Microbiology results:  12/22 UCx: neg FINAL 12/22 GI panel PCR:neg 12/23 bile fluid: moderate GPC in pairs, rare GNR 12/22 bcx x2:   Thank you for allowing pharmacy to be a part of this patient's care.  Lynelle Doctor 03/21/2016 1:15 PM

## 2016-03-21 NOTE — Care Management Note (Signed)
Case Management Note  Patient Details  Name: [name] MRN: [id number] Date of Birth: 08-17-1945  Subjective/Objective:                  metastatic non-small cell lung cancer Action/Plan: Discharge planning Expected Discharge Date:   (unknown)               Expected Discharge Plan:  [place]  In-House Referral:     Discharge planning Services  CM Consult  Post Acute Care Choice:  Durable Medical Equipment Choice offered to:  Spouse  DME Arranged:  Oxygen DME Agency:  [place] Arranged:  RN, PT, OT, Nurse's Aide, Social Work [employer] Agency:  [place]  Status of Service:  Completed, signed off  If discussed at [employer], dates discussed:    Additional Comments: CM spoke with pt's spouse for choice of home health agency. Family choose [facility] to render HHPT/OT/Aide/RN/SW (for purpose of possibly arranging home hospice with [facility]).  CM requested MD to place orders. Referral called to [facility] rep, Jermaine and for arranging home O2.  Cm notified [place] DME rep, Reggie to please deliver the home O2 tank to room so pt can discharge.  No other CM needs were communicated. Dellie Catholic, RN 03/21/2016, 2:46 PM

## 2016-03-21 NOTE — Progress Notes (Addendum)
Patient ID: [name], male   DOB: 1945/07/29, 70 y.o.   MRN: [id number]   Paracentesis requested.  Limited US abdomen performed No fluid noted in 4 quadrants No paracentesis performed

## 2016-03-21 NOTE — Discharge Summary (Addendum)
Physician Discharge Summary  [name] ZCH:[id number] DOB: 11/18/1945 DOA: 03/19/2016  PCP: [name], MD  Admit date: 03/19/2016 Discharge date: 03/21/2016  Admitted From: home  Disposition:  Home with hospice  Recommendations for Outpatient Follow-up:  1. Follow up with Dr. [name] in 1 weeks 2. Please obtain CMP/CBC in one week 3. F/u on bile culture 4. Radiology in 2-3 months for exchange of external/internal drain and attempt at foreign body retrieval (stent) through a sheath  Home Health:  Home with hospice  Equipment/Devices:  2L [place]  Discharge Condition:  Stable, improved CODE STATUS:  DNR  Diet recommendation:  regular   Brief/Interim Summary:  The patient is a 70 year old male with history of metastatic non-small cell lung cancer with biliary obstruction.  He was started on Keytruda in August 2017, but had progression of disease despite therapy.  Over the last two months, he has had rising bilirubin.  His common bile duct stent was replaced in November due to concern that it was dislodged, but despite exchange, his bilirubin continued to climb.  IR was consulted to place a percutaneous biliary drain on 12/15.  Since then, his bilirubin has continued to rise gradually.  He had a course of keflex for possible cellulitis surrounding his drain and he has required IVF at the cancer center due to dehydration.  On the day of admission, he must have pulled his biliary drain during his sleep and he developed pain in the RUQ with blood in his drain.  His drain was exchanged at IR on 12/22 and he was given IVF at the cancer center but they were concerned for possible sepsis so he was directly admitted to the hospital for treatment.  He was started on broad-spectrum antibiotics.  Chest x-ray was unremarkable and urinalysis was negative.  Urine culture was negative.  Bile was sent to microbiology for culture and gram stain demonstrated abundant gram negative rods with moderate gram  positive cocci in pairs. Patient initially felt better after IV fluids and antibiotics but developed abdominal bloating and distension.  Abd [place] was negative for ascites.  KUB demonstrated copious air.  He was started on simethicone.  Recommended that he increase free water through his gastrostomy tube to prevent dehydration.        Discharge Diagnoses:  Principal Problem:   Cholangitis Active Problems:   Bronchogenic cancer of left lung (HCC)   Dyspnea   Obstructive jaundice due to cancer [facility])   Malnutrition of moderate degree (HCC)   Biliary obstruction   Non-small cell lung cancer (HCC)   Dehydration   Transaminitis   Leukocytosis   Acute kidney injury ([place])  Biliary obstruction resulting from complications of non-small cell lung cancer, metastatic -  Bilirubin stable but LFTs rising -  Case discussed with interventional radiology, Dr. Reesa Chew, he stated that because the patient does not have significant biliary dilation and because he suspects that the common bile duct drain is still somewhat patent, he will likely not have much drainage through his biliary drain and his bilirubin may not drop dramatically despite the exchange on 12/22. -  Spoke with Oncology, Dr. Alen Blew, who did not feel that the patient has immunotherapy-mediated hepatitis and did NOT recommend steroids -  Spoke with GI, Dr. Benson Norway, who stated he felt that there were no further procedures he or IR could offer Mr. Ewing to alleviate his hepatic congestion and he recommended palliative care/hospice care.   -  Discussed with family who elected to go home as soon  as possible with comfort measures -  Prognosis is less than 6 months and more likely weeks based on rising liver enzymes and abdominal symptoms with frequent dehydration   Diarrhea, 5-7 times per day present for the last 3 weeks -  GI pathogen panel negative -  I suspect that his diarrhea is related to his common bile duct drain  Abdominal pain and  distension -  Start simethicone, reglan as trial  Acute respiratory distress due to abdominal distension, O2 for comfort   Malnutrition, continue tube feeds and increase free water to prevent dehydration -advance oral diet for comfort  AKI, due to dehydration, creatinine trended down from 1.5 to baseline of 1.1 with IVF.  Leukocytosis, improved with IVF and antibiotics  Macrocytic anemia due to liver disease and due to hemorrhage from perc bili drain and hemodilution -  Hemoglobin stabilized at 9.4 2 g/dL over last 24 hours  Discharge Instructions  Discharge Instructions    Call MD for:  difficulty breathing, headache or visual disturbances    Complete by:  As directed    Call MD for:  extreme fatigue    Complete by:  As directed    Call MD for:  hives    Complete by:  As directed    Call MD for:  persistant dizziness or light-headedness    Complete by:  As directed    Call MD for:  persistant nausea and vomiting    Complete by:  As directed    Call MD for:  redness, tenderness, or signs of infection (pain, swelling, redness, odor or green/yellow discharge around incision site)    Complete by:  As directed    Call MD for:  severe uncontrolled pain    Complete by:  As directed    Call MD for:  temperature >100.4    Complete by:  As directed    Diet general    Complete by:  As directed    Discharge instructions    Complete by:  As directed    Please flush your PEG tube with 250 mL of water at least 4 times per day.  Continue augmentin for the next 5 days.  I will call you if we need to change your antibiotic.  Follow up with Dr. [name] in a week to review symptoms and have blood work.   Increase activity slowly    Complete by:  As directed        Medication List    TAKE these medications   amoxicillin-clavulanate 875-125 MG tablet Commonly known as:  AUGMENTIN Take 1 tablet by mouth 2 (two) times daily.   aspirin 325 MG tablet Place 650 mg into feeding tube every  6 (six) hours as needed for mild pain.   b complex vitamins tablet Place 1 tablet into feeding tube daily.   dicyclomine 20 MG tablet Commonly known as:  BENTYL Place 20 mg into feeding tube every 6 (six) hours as needed (for abdominal cramps).   diphenhydramine-acetaminophen 25-500 MG Tabs tablet Commonly known as:  TYLENOL PM Place 1 tablet into feeding tube at bedtime as needed (for sleep).   diphenoxylate-atropine 2.5-0.025 MG tablet Commonly known as:  LOMOTIL Place 2 tablets into feeding tube 4 (four) times daily as needed for diarrhea or loose stools.   feeding supplement (OSMOLITE 1.5 CAL) Liqd Place 237 mLs into feeding tube 3 (three) times daily.   TWOCAL HN Liqd Place 237 mLs into feeding tube 3 (three) times daily.   hydrOXYzine 25  MG tablet Commonly known as:  ATARAX/VISTARIL Place 25 mg into feeding tube 3 (three) times daily as needed for itching.   LORazepam 1 MG tablet Commonly known as:  ATIVAN Take 0.5-1 mg by mouth every 4 (four) hours as needed for anxiety.   metoCLOPramide 5 MG/5ML solution Commonly known as:  REGLAN Place 5 mLs (5 mg total) into feeding tube every 6 (six) hours as needed for nausea or vomiting (abdominal distension).   midodrine 5 MG tablet Commonly known as:  PROAMATINE Place 5 mg into feeding tube 3 (three) times daily with meals.   multivitamin with minerals Tabs tablet Place 1 tablet into feeding tube daily.   mupirocin ointment 2 % Commonly known as:  BACTROBAN Apply 1 application topically 2 (two) times daily.   omeprazole 40 MG capsule Commonly known as:  PRILOSEC 40 mg daily as needed (per tube for acid reflux).   ondansetron 8 MG tablet Commonly known as:  ZOFRAN Place 8 mg into feeding tube every 8 (eight) hours as needed for nausea or vomiting.   oxyCODONE 5 MG/5ML solution Commonly known as:  ROXICODONE Take 10 mLs (10 mg total) by mouth every 4 (four) hours as needed for moderate pain or severe pain.    promethazine 25 MG tablet Commonly known as:  PHENERGAN Place 25 mg into feeding tube every 6 (six) hours as needed for nausea or vomiting.   senna 8.6 MG tablet Commonly known as:  SENOKOT Place 1 tablet into feeding tube daily as needed for constipation.   simethicone 40 MG/0.6ML drops Commonly known as:  MYLICON Place 1.2 mLs (80 mg total) into feeding tube 4 (four) times daily.   triamcinolone ointment 0.1 % Commonly known as:  KENALOG Apply 1 application topically 2 (two) times daily. Pt applies to both feet.   vitamin C 500 MG tablet Commonly known as:  ASCORBIC ACID Place 500 mg into feeding tube daily.      Follow-up Information    Lajuana Matte., MD. Schedule an appointment as soon as possible for a visit in 1 week(s).   Specialty:  Oncology Why:  Repeat blood work Contact information: [address] [place] [address] [phone]        [facility] Follow up.   Why:  drain clinic in 2 months to discuss removal of stent and exchange of percutaneous biliary drain       MEYERS, STEPHEN, MD Follow up.   Specialty:  Family Medicine Why:  As needed Contact information: [address] [place] [address] [phone]          Allergies  Allergen Reactions  . Augmentin [Amoxicillin-Pot Clavulanate] Diarrhea and Other (See Comments)    .Has patient had a PCN reaction causing immediate rash, facial/tongue/throat swelling, SOB or lightheadedness with hypotension: No Has patient had a PCN reaction causing severe rash involving mucus membranes or skin necrosis: No Has patient had a PCN reaction that required hospitalization No Has patient had a PCN reaction occurring within the last 10 years: Yes If all of the above answers are "NO", then may proceed with Cephalosporin use.   . Fentanyl Other (See Comments)    Reaction:  Hallucinations   . Scopolamine Other (See Comments)    Reaction:  Hallucinations   . Zolpidem Tartrate  Diarrhea  . Morphine And Related Itching  . Carboplatin Itching and Rash  . Levaquin [Levofloxacin] Other (See Comments)    Reaction:  Knee pain     Consultations:  Interventional Radiology  Procedures/Studies: Dg Chest 2 View  Result Date: 03/19/2016 CLINICAL DATA:  Chills jaundice and dehydration EXAM: CHEST  2 VIEW COMPARISON:  10/21/2015, 01/20/2016 . FINDINGS: No acute consolidation or pleural effusion is visualized. Volume loss in the right upper lobe with paramediastinal fibrosis, similar compared to previous CT. Stable heart size. Atherosclerosis. No pneumothorax. Drain in the right upper quadrant IMPRESSION: 1. No acute infiltrate or consolidation 2. Stable right upper lobe paramediastinal opacity compatible with scarring and volume loss Electronically Signed   By: Donavan Foil M.D.   On: 03/19/2016 17:38   Ir Fluoro Rm 30-60 Min  Result Date: 03/02/2016 INDICATION: 70 year old male with a history of percutaneous gastrostomy tube placement with a retained T tack on CT dated 01/20/2016. EXAM: IR FLOURO RM 0-60 MIN COMPARISON:  CT 01/20/2016 MEDICATIONS: None ANESTHESIA/SEDATION: None FLUOROSCOPY TIME:  Fluoroscopy Time: 0 minutes 42 seconds (6 mGy). COMPLICATIONS: None TECHNIQUE: Informed written consent was obtained from the patient after a thorough discussion of the procedural risks, benefits and alternatives. All questions were addressed. Maximal Sterile Barrier Technique was utilized including caps, mask, sterile gowns, sterile gloves, sterile drape, hand hygiene and skin antiseptic. A timeout was performed prior to the initiation of the procedure. Patient is position in the supine position on the fluoroscopy table. The region surrounding G-tube was prepped and draped in the usual sterile fashion. The skin and subcutaneous tissues at the ostomy site were generously infiltrated 1% lidocaine for local anesthesia. Fluoroscopic survey was then performed at the site of the ostomy, searching  for the retained foreign body (retained T tack from prior gastrostomy). This was not present at the ostomy site, and so the rest of the abdomen was surveyed. No evidence of the T tack on the fluoroscopic survey. At this time, the granulation tissue at the ostomy site was treated with silver nitrate. Dressing was placed. Patient tolerated the procedure well and remained hemodynamically stable throughout. No complications were encountered and no significant blood loss encountered. FINDINGS: Fluoroscopic survey demonstrates no evidence of retained T tack, which seems to have passed through the GI system. Silver nitrate treatment of granulation tissue. IMPRESSION: Status post fluoroscopic survey of the abdomen demonstrating no evidence of retained foreign body on the current study, which seems to have passed through the GI tract. Local treatment of granulation tissue at the ostomy site with silver nitrate. Should this recur, topical treatment with repeat silver nitrate or alternatively a prescription for Kenalog cream, either 0.25% or 1% may be considered. Signed, Dulcy Fanny. [name], DO [facility] Radiology Electronically Signed   By: Corrie Mckusick D.O.   On: 03/02/2016 16:56   Ct Abdomen W Wo Contrast  Result Date: 03/10/2016 CLINICAL DATA:  Non-small cell lung cancer, metastatic, treated with radiotherapy and chemotherapy. Elevated bilirubin. EXAM: CT ABDOMEN WITHOUT AND WITH CONTRAST TECHNIQUE: Multidetector CT imaging of the abdomen was performed following the standard protocol before and following the bolus administration of intravenous contrast. CONTRAST:  158m ISOVUE-300 IOPAMIDOL (ISOVUE-300) INJECTION 61% COMPARISON:  01/20/2016. FINDINGS: Lower chest: Lung bases show volume loss in the right lower lobe with associated pleural thickening. Heart size normal. Coronary artery calcification. No pericardial or pleural effusion. Hepatobiliary: Mild pneumobilia with  a common bile duct stent in place. Liver is otherwise unremarkable. Stones and air are seen in the gallbladder. No biliary ductal dilatation. Pancreas: Negative. Spleen: Negative. Adrenals/Urinary Tract: Adrenal glands and right kidney are unremarkable. A 1.5 cm cyst is seen in the left  kidney. Kidneys are otherwise unremarkable. Stomach/Bowel: Percutaneous gastrostomy. Stomach and visualized portions of the small bowel and colon are otherwise unremarkable. Vascular/Lymphatic: Atherosclerotic calcification of the arterial vasculature. Infrarenal aorta measures up to 2.7 cm. No pathologically enlarged lymph nodes. Other: No free fluid. Mesenteries and peritoneum are unremarkable. Fluid density structure adjacent to the caudate lobe is unchanged. Musculoskeletal: No worrisome lytic or sclerotic lesions. IMPRESSION: 1. No acute findings to explain the patient's elevated bilirubin. Common bile duct stent is in place. 2. Cholelithiasis. 3. Aortic atherosclerosis (ICD10-170.0). Coronary artery calcification. 4. Ectatic abdominal aorta at risk for aneurysm development. Recommend followup by ultrasound in 5 years. This recommendation follows ACR consensus guidelines: White Paper of the ACR Incidental Findings Committee II on Vascular Findings. J Am Coll Radiol 2013; 10:789-794. Electronically Signed   By: Lorin Picket M.D.   On: 03/10/2016 13:50   Dg Abd Portable 1v  Result Date: 03/21/2016 CLINICAL DATA:  Abdominal distention. EXAM: PORTABLE ABDOMEN - 1 VIEW COMPARISON:  CT 03/10/2016 FINDINGS: Percutaneous gastrostomy tube over the stomach in the left upper quadrant. Biliary stent over the right upper quadrant unchanged. Interval placement of pigtail drainage catheter over the right upper quadrant which pigtail adjacent the distal end of the biliary stent. Bowel gas pattern demonstrates several air-filled nondilated large and small bowel loops. No free peritoneal air. There are degenerative changes of the spine  and hips. IMPRESSION: Nonobstructive bowel gas pattern. Tubes and lines as described. Electronically Signed   By: Marin Olp M.D.   On: 03/21/2016 11:33   Ir Int Lianne Cure Biliary Drain With Cholangiogram  Result Date: 03/12/2016 INDICATION: 70 year old male with a history of hyperbilirubinemia. Potentially the lab abnormality is secondary to medical liver disease. Treatment would defer or if this was secondary to medical liver disease and not obstructive. He has been referred for diagnostic PTC and possible drainage EXAM: IR INT-EXT BILIARY DRAIN W/ CHOLANGIOGRAM MEDICATIONS: Clindamycin; The antibiotic was administered within an appropriate time frame prior to the initiation of the procedure. ANESTHESIA/SEDATION: Moderate (conscious) sedation was employed during this procedure. A total of Versed 4.0 mg and 2.0 mg Dilaudid mcg was administered intravenously. Moderate Sedation Time: 49 minutes. The patient's level of consciousness and vital signs were monitored continuously by radiology nursing throughout the procedure under my direct supervision. FLUOROSCOPY TIME:  Fluoroscopy Time: 5 minutes 0 seconds COMPLICATIONS: None PROCEDURE: The procedure, risks, benefits, and alternatives were explained to the patient and the patient's family. A complete informed consent was performed, with risk benefit analysis. Specific risks that were discussed for the procedure include bleeding, infection, biliary sepsis, IC use day, organ injury, need for further procedure, need for further surgery, long-term drain placement, cardiopulmonary collapse, death. Questions regarding the procedure were encouraged and answered. The patient understands and consents to the procedure. Patient is position in supine position on the fluoroscopy table, and the upper abdomen was prepped and draped in the usual sterile fashion. Maximum barrier sterile technique with sterile gowns and gloves were used for the procedure. A timeout was performed prior  to the initiation of the procedure. Local anesthesia was provided with 1% lidocaine with epinephrine. 1% lidocaine was used for local anesthesia, with generous infiltration of the skin and subcutaneous tissues in and inter left costal location. A Chiba needle was advanced under fluoroscopic guidance into the right liver lobe. Upon withdrawal, contrast was infused identifying biliary system. Once the tip of the needle was confirmed within the biliary system by injecting small aliquots of contrast, images were stored  of the biliary system after partially opacifying the biliary tree via the needle. A second access was required for access into the biliary system, from intercostal location. Once the tip of this needle was confirmed within the biliary system, an 018 wire was advanced centrally. The needle was removed, a small incision was made with an 11 blade scalpel, and then a triaxial Accustick system was advanced into the biliary system. The metal stiffener and dilator were removed, we confirmed placement with contrast infusion. An 035 wire was then placed through the Accustick sheath, parallel to the indwelling plastic stent. Eight [place] biliary drain was then placed over the wire. A single side hole was added approximately 1 cm beyond the radiopaque marker. Contrast injection confirmed location. The patient tolerated the procedure well and remained hemodynamically stable throughout. No complications were encountered and no significant blood loss was encountered. IMPRESSION: Status post percutaneous transhepatic cholangiogram with placement of internal/ external biliary drain with a modified 8 [place] biliary drain. Signed, Dulcy Fanny. [name] DO [facility] Radiology PLAN: The patient will be observing his future appointments with Oncology. We will plan on exchanging the internal external drain in 2-3 months. At that time, it may be useful to attempt a foreign body  retrieval through a sheath, with attempt at removal of the of the stent. Electronically Signed   By: Corrie Mckusick D.O.   On: 03/12/2016 16:42   Ir Exchange Biliary Drain  Result Date: 03/19/2016 INDICATION: Occluded retracted right internal external biliary drain EXAM: IR EXCHANGE BILARY DRAIN MEDICATIONS: Patient is already on oral antibiotics. ANESTHESIA/SEDATION: None. FLUOROSCOPY TIME:  Fluoroscopy Time: 4 minutes 6 seconds (61 mGy). COMPLICATIONS: None immediate. PROCEDURE: Informed written consent was obtained from the patient after a thorough discussion of the procedural risks, benefits and alternatives. All questions were addressed. Maximal Sterile Barrier Technique was utilized including caps, mask, sterile gowns, sterile gloves, sterile drape, hand hygiene and skin antiseptic. A timeout was performed prior to the initiation of the procedure. Initial fluoroscopic imaging and injection of the right internal external biliary drain demonstrates catheter retraction into the common bile duct. 8 [place] biliary drain is also occluded. Biliary tree is not opacified. Under sterile conditions and local anesthesia, the existing 8 [place] biliary drain was removed over a Glidewire. Access was readvanced into the duodenum. A new 8 [place] biliary drain was advanced with the retention loop formed in the duodenum. Contrast injection confirms appropriate position and drainage of the biliary system. Catheter secured with a Prolene suture and a sterile dressing. No immediate complication. Images obtained for documentation. Patient tolerated the procedure well. IMPRESSION: Successful fluoroscopic exchange and reposition of the 8 [place] right external internal biliary drain. Electronically Signed   By: Jerilynn Mages.  Shick M.D.   On: 03/19/2016 12:35   Ir Radiologist Eval & Mgmt  Result Date: 03/17/2016 Please refer to notes tab for details about interventional procedure. (Op Note)   Subjective:  Feels Lira Stephen of breath  secondary to abdominal distension.  Ongoing small volume watery stools and passed flatus during BMs.    Discharge Exam: Vitals:   03/21/16 0625 03/21/16 1351  BP: 123/73 115/75  Pulse: (!) 101 99  Resp: 18 18  Temp: 98.6 F (37 C) 98.3 F (36.8 C)   Vitals:   03/20/16 1412 03/20/16 2035 03/21/16 0625 03/21/16 1351  BP: 101/67 122/79 123/73 115/75  Pulse: 68 88 (!) 101 99  Resp: _0 Temp: 98.6 F (37 C) 98.2 F (  36.8 C) 98.6 F (37 C) 98.3 F (36.8 C)  TempSrc: Oral Oral Oral Oral  SpO2: 94% 97% 93% 94%  Weight:      Height:        General exam:  Cachectic appearing adult Male.  mild respiratory distress with SCM retractions HEENT:  NCAT, MMM Respiratory system: Clear to auscultation bilaterally Cardiovascular system: Regular rate and rhythm, normal S1/S2. No murmurs, rubs, gallops or clicks.  Warm extremities Gastrointestinal system: Normal active bowel sounds, soft, moderately distended, tender to palpation in the right upper quadrant and epigastric area without rebound or guarding MSK:  Normal tone and bulk, no lower extremity edema Neuro:  Grossly intact    The results of significant diagnostics from this hospitalization (including imaging, microbiology, ancillary and laboratory) are listed below for reference.     Microbiology: Recent Results (from the past 240 hour(s))  Culture, Blood     Status: None (Preliminary result)   Collection Time: 03/19/16 12:33 PM  Result Value Ref Range Status   BLOOD CULTURE, ROUTINE Preliminary report  Preliminary   RESULT 1 Comment  Preliminary    Comment: No growth detected at this time.  Culture, blood (single) w Reflex to ID Panel     Status: None (Preliminary result)   Collection Time: 03/19/16 12:38 PM  Result Value Ref Range Status   BLOOD CULTURE, ROUTINE Preliminary report  Preliminary   RESULT 1 Comment  Preliminary    Comment: No growth detected at this time.  Urine culture     Status: None   Collection  Time: 03/19/16  5:47 PM  Result Value Ref Range Status   Specimen Description URINE, RANDOM  Final   Special Requests NONE  Final   Culture NO GROWTH Performed at [facility]   Final   Report Status 03/21/2016 FINAL  Final  Gastrointestinal Panel by PCR , Stool     Status: None   Collection Time: 03/19/16  6:12 PM  Result Value Ref Range Status   Campylobacter species NOT DETECTED NOT DETECTED Final   Plesimonas shigelloides NOT DETECTED NOT DETECTED Final   Salmonella species NOT DETECTED NOT DETECTED Final   Yersinia enterocolitica NOT DETECTED NOT DETECTED Final   Vibrio species NOT DETECTED NOT DETECTED Final   Vibrio cholerae NOT DETECTED NOT DETECTED Final   Enteroaggregative E coli (EAEC) NOT DETECTED NOT DETECTED Final   Enteropathogenic E coli (EPEC) NOT DETECTED NOT DETECTED Final   Enterotoxigenic E coli (ETEC) NOT DETECTED NOT DETECTED Final   Shiga like toxin producing E coli (STEC) NOT DETECTED NOT DETECTED Final   Shigella/Enteroinvasive E coli (EIEC) NOT DETECTED NOT DETECTED Final   Cryptosporidium NOT DETECTED NOT DETECTED Final   Cyclospora cayetanensis NOT DETECTED NOT DETECTED Final   Entamoeba histolytica NOT DETECTED NOT DETECTED Final   Giardia lamblia NOT DETECTED NOT DETECTED Final   Adenovirus F40/41 NOT DETECTED NOT DETECTED Final   Astrovirus NOT DETECTED NOT DETECTED Final   Norovirus GI/GII NOT DETECTED NOT DETECTED Final   Rotavirus A NOT DETECTED NOT DETECTED Final   Sapovirus (I, II, IV, and V) NOT DETECTED NOT DETECTED Final  Body fluid culture     Status: None (Preliminary result)   Collection Time: 03/20/16  1:50 PM  Result Value Ref Range Status   Specimen Description BILE  Final   Special Requests Normal  Final   Gram Stain   Final    NO WBC SEEN ABUNDANT GRAM NEGATIVE RODS MODERATE GRAM POSITIVE COCCI IN  PAIRS RARE GRAM POSITIVE RODS    Culture   Final    CULTURE REINCUBATED FOR BETTER GROWTH Performed at [facility]    Report Status PENDING  Incomplete     Labs: BNP (last 3 results) No results for input(s): BNP in the last 8760 hours. Basic Metabolic Panel:  Recent Labs Lab 03/16/16 1114 03/19/16 1232 03/20/16 0440 03/21/16 0422  NA 138 135* 141 140  K 4.8 4.3 3.9 4.2  CL  --   --  116* 113*  CO2 22 20* 18* 19*  GLUCOSE 128 100 143* 154*  BUN 30.5* 50.1* 42* 31*  CREATININE 1.1 1.5* 1.36* 1.06  CALCIUM 9.5 9.1 8.1* 8.1*   Liver Function Tests:  Recent Labs Lab 03/16/16 1114 03/19/16 1232 03/20/16 0440 03/21/16 0422  AST 67* 97* 93* 118*  ALT 96* 100* 83* 100*  ALKPHOS 239* 266* 236* 328*  BILITOT 8.13* 9.77* 10.1* 10.2*  PROT 6.6 6.4 5.0* 5.2*  ALBUMIN 2.5* 2.7* 2.5* 2.5*    Recent Labs Lab 03/19/16 1805  LIPASE 13    Recent Labs Lab 03/21/16 0917  AMMONIA <9*   CBC:  Recent Labs Lab 03/16/16 1114 03/19/16 1232 03/20/16 0440 03/21/16 0422  WBC 7.2 12.6* 6.8 6.3  NEUTROABS 5.7 10.2*  --   --   HGB 11.6* 11.0* 9.1* 9.4*  HCT 34.8* 31.4* 26.0* 27.2*  MCV 105.9* 101.9* 101.6* 101.9*  PLT 134* 149 113* 117*   Cardiac Enzymes: No results for input(s): CKTOTAL, CKMB, CKMBINDEX, TROPONINI in the last 168 hours. BNP: Invalid input(s): POCBNP CBG: No results for input(s): GLUCAP in the last 168 hours. D-Dimer No results for input(s): DDIMER in the last 72 hours. Hgb A1c No results for input(s): HGBA1C in the last 72 hours. Lipid Profile No results for input(s): CHOL, HDL, LDLCALC, TRIG, CHOLHDL, LDLDIRECT in the last 72 hours. Thyroid function studies  Recent Labs  03/19/16 1233  TSH 2.964   Anemia work up No results for input(s): VITAMINB12, FOLATE, FERRITIN, TIBC, IRON, RETICCTPCT in the last 72 hours. Urinalysis    Component Value Date/Time   COLORURINE AMBER (A) 03/19/2016 1747   APPEARANCEUR CLEAR 03/19/2016 1747   LABSPEC 1.010 03/19/2016 1747   PHURINE 6.0 03/19/2016 1747   GLUCOSEU NEGATIVE 03/19/2016 1747   HGBUR NEGATIVE  03/19/2016 1747   BILIRUBINUR NEGATIVE 03/19/2016 1747   KETONESUR NEGATIVE 03/19/2016 1747   PROTEINUR NEGATIVE 03/19/2016 1747   UROBILINOGEN 0.2 01/28/2015 1000   NITRITE NEGATIVE 03/19/2016 1747   LEUKOCYTESUR NEGATIVE 03/19/2016 1747   Sepsis Labs Invalid input(s): PROCALCITONIN,  WBC,  LACTICIDVEN   Time coordinating discharge: Over 30 minutes  SIGNED:   Janece Canterbury, MD  [facility] 03/21/2016, 2:18 PM Pager   If 7PM-7AM, please contact night-coverage [email or web address] Password [email or web address]

## 2016-03-21 NOTE — Progress Notes (Signed)
SATURATION QUALIFICATIONS: (This note is used to comply with regulatory documentation for home oxygen)  Patient Saturations on Room Air at Rest = 84%  Patient Saturations on Room Air while Ambulating = 83%  Patient Saturations on 2 Liters of oxygen while Ambulating = 95%  Please briefly explain why patient needs home oxygen: to maintain adequate oxygenation

## 2016-03-21 NOTE — Progress Notes (Signed)
Initial Nutrition Assessment  INTERVENTION:   If medically feasible, resume home TF regimen of 3 cans of Osmolite 1.5 + 3 cans of TwoCal HN via continuous feeds. This provides 2490 kcal (100% of needs), 105g protein, and 1041 ml H2O.  RD will continue to follow.  NUTRITION DIAGNOSIS:   Inadequate oral intake related to poor appetite, inability to eat as evidenced by NPO status.  GOAL:   Patient will meet greater than or equal to 90% of their needs  MONITOR:   Labs, Weight trends, TF tolerance, I & O's, GOC  REASON FOR ASSESSMENT:   Consult Assessment of nutrition requirement/status  ASSESSMENT:    70 year old male with history of metastatic non-small cell lung cancer with biliary obstruction.  He was started on Keytruda in August 2017, but had progression of disease despite therapy.  Over the last two months, he has had rising bilirubin.  His common bile duct stent was replaced in November due to concern that it was dislodged, but despite exchange, his bilirubin continued to climb.  IR was consulted to place a percutaneous biliary drain on 12/15.  Since then, his bilirubin has continued to rise gradually.  He had a course of keflex for possible cellulitis surrounding his drain nad he has required IVF at the cancer center due to dehydration.  On the day of admission, he must have pulled his biliary drain during his sleep and he developed pain in the RUQ with blood in his drain.  His drain was exchanged at IR on 12/22 and he was given IVF at the cancer center but they were concerned for possible sepsis so he was directly admitted to the hospital for treatment.  Patient not in room at time of visit, patient was having ultrasound per nurse tech. Per nurse tech, pt has eaten very little PO despite having regular diet ordered. Now NPO for ultrasound and for abdominal distention. Spoke with RN, who states MD does not want patient to receive TF at this time until paracentesis has been  attempted. Per radiology note, paracentesis was unable to be performed d/t no fluid found.  Pt's home TF regimen via PEG: 3 cans of Osmolite 1.5 + 3 cans of TwoCal HN administered via pump for continuous feedings. Pt has been having 5-7 episodes of diarrhea PTA, per MD note mostly likely related to bile duct drain.  Per chart review, pt has not lost any weight recently. Unable to perform NFPE as pt was not in the room.  Labs reviewed. Medications reviewed.  Diet Order:  Diet NPO time specified  Skin:  Reviewed, no issues  Last BM:  12/23  Height:   Ht Readings from Last 1 Encounters:  03/19/16 '5\' 7"'$  (1.702 m)    Weight:   Wt Readings from Last 1 Encounters:  03/19/16 137 lb 6.4 oz (62.3 kg)    Ideal Body Weight:  67.2 kg  BMI:  Body mass index is 21.52 kg/m.  Estimated Nutritional Needs:   Kcal:  2200-2500  Protein:  95-105g  Fluid:  1.9-2L/day  EDUCATION NEEDS:   No education needs identified at this time  Clayton Bibles, MS, RD, LDN Pager: [phone] After Hours Pager: [phone]

## 2016-03-21 NOTE — ED Provider Notes (Signed)
Patient's walking saturation without O2 was 83% and with O2 was between 95% to 97%.

## 2016-03-22 ENCOUNTER — Inpatient Hospital Stay (HOSPITAL_COMMUNITY)
Admission: EM | Admit: 2016-03-22 | Discharge: 2016-03-25 | DRG: 871 | Disposition: A | Discharge status: Hospice | Payer: Medicare Other | Attending: Family Medicine | Admitting: Family Medicine

## 2016-03-22 ENCOUNTER — Other Ambulatory Visit (HOSPITAL_COMMUNITY): Payer: Medicare Other

## 2016-03-22 ENCOUNTER — Other Ambulatory Visit: Payer: Self-pay

## 2016-03-22 ENCOUNTER — Emergency Department (HOSPITAL_COMMUNITY): Payer: Medicare Other

## 2016-03-22 ENCOUNTER — Encounter (HOSPITAL_COMMUNITY): Payer: Self-pay | Admitting: Emergency Medicine

## 2016-03-22 DIAGNOSIS — B9689 Other specified bacterial agents as the cause of diseases classified elsewhere: Secondary | ICD-10-CM | POA: Diagnosis present

## 2016-03-22 DIAGNOSIS — F411 Generalized anxiety disorder: Secondary | ICD-10-CM

## 2016-03-22 DIAGNOSIS — Z515 Encounter for palliative care: Secondary | ICD-10-CM | POA: Diagnosis not present

## 2016-03-22 DIAGNOSIS — Z800 Family history of malignant neoplasm of digestive organs: Secondary | ICD-10-CM | POA: Diagnosis not present

## 2016-03-22 DIAGNOSIS — E870 Hyperosmolality and hypernatremia: Secondary | ICD-10-CM | POA: Diagnosis present

## 2016-03-22 DIAGNOSIS — K830 Cholangitis: Secondary | ICD-10-CM | POA: Diagnosis present

## 2016-03-22 DIAGNOSIS — A419 Sepsis, unspecified organism: Secondary | ICD-10-CM | POA: Diagnosis present

## 2016-03-22 DIAGNOSIS — Z885 Allergy status to narcotic agent status: Secondary | ICD-10-CM

## 2016-03-22 DIAGNOSIS — Z923 Personal history of irradiation: Secondary | ICD-10-CM

## 2016-03-22 DIAGNOSIS — K831 Obstruction of bile duct: Secondary | ICD-10-CM | POA: Diagnosis not present

## 2016-03-22 DIAGNOSIS — Z881 Allergy status to other antibiotic agents status: Secondary | ICD-10-CM | POA: Diagnosis not present

## 2016-03-22 DIAGNOSIS — Z8249 Family history of ischemic heart disease and other diseases of the circulatory system: Secondary | ICD-10-CM

## 2016-03-22 DIAGNOSIS — J189 Pneumonia, unspecified organism: Secondary | ICD-10-CM | POA: Diagnosis present

## 2016-03-22 DIAGNOSIS — Z801 Family history of malignant neoplasm of trachea, bronchus and lung: Secondary | ICD-10-CM | POA: Diagnosis not present

## 2016-03-22 DIAGNOSIS — N400 Enlarged prostate without lower urinary tract symptoms: Secondary | ICD-10-CM | POA: Diagnosis present

## 2016-03-22 DIAGNOSIS — Z792 Long term (current) use of antibiotics: Secondary | ICD-10-CM | POA: Diagnosis not present

## 2016-03-22 DIAGNOSIS — K7290 Hepatic failure, unspecified without coma: Secondary | ICD-10-CM

## 2016-03-22 DIAGNOSIS — C3490 Malignant neoplasm of unspecified part of unspecified bronchus or lung: Secondary | ICD-10-CM | POA: Diagnosis not present

## 2016-03-22 DIAGNOSIS — J80 Acute respiratory distress syndrome: Secondary | ICD-10-CM | POA: Diagnosis present

## 2016-03-22 DIAGNOSIS — Z888 Allergy status to other drugs, medicaments and biological substances status: Secondary | ICD-10-CM

## 2016-03-22 DIAGNOSIS — R000 Tachycardia, unspecified: Secondary | ICD-10-CM | POA: Diagnosis present

## 2016-03-22 DIAGNOSIS — C7989 Secondary malignant neoplasm of other specified sites: Secondary | ICD-10-CM | POA: Diagnosis present

## 2016-03-22 DIAGNOSIS — R7881 Bacteremia: Secondary | ICD-10-CM | POA: Diagnosis not present

## 2016-03-22 DIAGNOSIS — Z7189 Other specified counseling: Secondary | ICD-10-CM

## 2016-03-22 DIAGNOSIS — R0600 Dyspnea, unspecified: Secondary | ICD-10-CM

## 2016-03-22 DIAGNOSIS — T451X5A Adverse effect of antineoplastic and immunosuppressive drugs, initial encounter: Secondary | ICD-10-CM | POA: Diagnosis present

## 2016-03-22 DIAGNOSIS — R0602 Shortness of breath: Secondary | ICD-10-CM

## 2016-03-22 DIAGNOSIS — Z79899 Other long term (current) drug therapy: Secondary | ICD-10-CM

## 2016-03-22 DIAGNOSIS — Y95 Nosocomial condition: Secondary | ICD-10-CM | POA: Diagnosis present

## 2016-03-22 DIAGNOSIS — Z66 Do not resuscitate: Secondary | ICD-10-CM | POA: Diagnosis present

## 2016-03-22 DIAGNOSIS — Z7982 Long term (current) use of aspirin: Secondary | ICD-10-CM

## 2016-03-22 DIAGNOSIS — C786 Secondary malignant neoplasm of retroperitoneum and peritoneum: Secondary | ICD-10-CM | POA: Diagnosis present

## 2016-03-22 DIAGNOSIS — J690 Pneumonitis due to inhalation of food and vomit: Secondary | ICD-10-CM | POA: Diagnosis present

## 2016-03-22 DIAGNOSIS — C3492 Malignant neoplasm of unspecified part of left bronchus or lung: Secondary | ICD-10-CM | POA: Diagnosis present

## 2016-03-22 DIAGNOSIS — R64 Cachexia: Secondary | ICD-10-CM | POA: Diagnosis present

## 2016-03-22 DIAGNOSIS — K7200 Acute and subacute hepatic failure without coma: Secondary | ICD-10-CM

## 2016-03-22 DIAGNOSIS — Z85118 Personal history of other malignant neoplasm of bronchus and lung: Secondary | ICD-10-CM

## 2016-03-22 DIAGNOSIS — J9601 Acute respiratory failure with hypoxia: Secondary | ICD-10-CM | POA: Diagnosis not present

## 2016-03-22 DIAGNOSIS — Z931 Gastrostomy status: Secondary | ICD-10-CM

## 2016-03-22 DIAGNOSIS — R197 Diarrhea, unspecified: Secondary | ICD-10-CM | POA: Diagnosis not present

## 2016-03-22 DIAGNOSIS — D539 Nutritional anemia, unspecified: Secondary | ICD-10-CM | POA: Diagnosis present

## 2016-03-22 DIAGNOSIS — C7800 Secondary malignant neoplasm of unspecified lung: Secondary | ICD-10-CM | POA: Diagnosis present

## 2016-03-22 DIAGNOSIS — H9190 Unspecified hearing loss, unspecified ear: Secondary | ICD-10-CM | POA: Diagnosis present

## 2016-03-22 DIAGNOSIS — E46 Unspecified protein-calorie malnutrition: Secondary | ICD-10-CM | POA: Diagnosis present

## 2016-03-22 DIAGNOSIS — K529 Noninfective gastroenteritis and colitis, unspecified: Secondary | ICD-10-CM | POA: Diagnosis present

## 2016-03-22 DIAGNOSIS — Z87891 Personal history of nicotine dependence: Secondary | ICD-10-CM

## 2016-03-22 DIAGNOSIS — Z9221 Personal history of antineoplastic chemotherapy: Secondary | ICD-10-CM

## 2016-03-22 DIAGNOSIS — Z6822 Body mass index (BMI) 22.0-22.9, adult: Secondary | ICD-10-CM

## 2016-03-22 DIAGNOSIS — R7989 Other specified abnormal findings of blood chemistry: Secondary | ICD-10-CM

## 2016-03-22 LAB — BLOOD GAS, ARTERIAL
ACID-BASE DEFICIT: 4.6 mmol/L — AB (ref 0.0–2.0)
BICARBONATE: 18.6 mmol/L — AB (ref 20.0–28.0)
DRAWN BY: 331471
Delivery systems: POSITIVE
Expiratory PAP: 6
FIO2: 40
Inspiratory PAP: 12
O2 SAT: 94.6 %
PATIENT TEMPERATURE: 98.6
PO2 ART: 74.8 mmHg — AB (ref 83.0–108.0)
pCO2 arterial: 30.2 mmHg — ABNORMAL LOW (ref 32.0–48.0)
pH, Arterial: 7.406 (ref 7.350–7.450)

## 2016-03-22 LAB — COMPREHENSIVE METABOLIC PANEL
ALK PHOS: 373 U/L — AB (ref 38–126)
ALT: 120 U/L — AB (ref 17–63)
AST: 127 U/L — ABNORMAL HIGH (ref 15–41)
Albumin: 2.6 g/dL — ABNORMAL LOW (ref 3.5–5.0)
Anion gap: 10 (ref 5–15)
BILIRUBIN TOTAL: 13.8 mg/dL — AB (ref 0.3–1.2)
BUN: 32 mg/dL — ABNORMAL HIGH (ref 6–20)
CALCIUM: 8.7 mg/dL — AB (ref 8.9–10.3)
CO2: 20 mmol/L — ABNORMAL LOW (ref 22–32)
CREATININE: 1.27 mg/dL — AB (ref 0.61–1.24)
Chloride: 113 mmol/L — ABNORMAL HIGH (ref 101–111)
GFR calc Af Amer: >60 mL/min (ref >60)
GFR calc non Af Amer: 55 mL/min — ABNORMAL LOW (ref >60)
GLUCOSE: 142 mg/dL — AB (ref 65–99)
Potassium: 4.2 mmol/L (ref 3.5–5.1)
SODIUM: 143 mmol/L (ref 135–145)
Total Protein: 6 g/dL — ABNORMAL LOW (ref 6.5–8.1)

## 2016-03-22 LAB — BODY FLUID CULTURE
GRAM STAIN: NONE SEEN
SPECIAL REQUESTS: NORMAL

## 2016-03-22 LAB — CBC WITH DIFFERENTIAL/PLATELET
BASOS PCT: 0 %
Basophils Absolute: 0 10*3/uL (ref 0.0–0.1)
EOS PCT: 1 %
Eosinophils Absolute: 0.2 10*3/uL (ref 0.0–0.7)
HEMATOCRIT: 30.1 % — AB (ref 39.0–52.0)
HEMOGLOBIN: 10.6 g/dL — AB (ref 13.0–17.0)
LYMPHS PCT: 5 %
Lymphs Abs: 0.8 10*3/uL (ref 0.7–4.0)
MCH: 36.1 pg — AB (ref 26.0–34.0)
MCHC: 35.2 g/dL (ref 30.0–36.0)
MCV: 102.4 fL — AB (ref 78.0–100.0)
MONOS PCT: 6 %
Monocytes Absolute: 1 10*3/uL (ref 0.1–1.0)
Neutro Abs: 14.9 10*3/uL — ABNORMAL HIGH (ref 1.7–7.7)
Neutrophils Relative %: 88 %
Platelets: 152 10*3/uL (ref 150–400)
RBC: 2.94 MIL/uL — ABNORMAL LOW (ref 4.22–5.81)
RDW: 14.9 % (ref 11.5–15.5)
WBC: 16.9 10*3/uL — ABNORMAL HIGH (ref 4.0–10.5)

## 2016-03-22 LAB — URINALYSIS, ROUTINE W REFLEX MICROSCOPIC
Bacteria, UA: NONE SEEN
GLUCOSE, UA: NEGATIVE mg/dL
HGB URINE DIPSTICK: NEGATIVE
KETONES UR: NEGATIVE mg/dL
LEUKOCYTES UA: NEGATIVE
Nitrite: NEGATIVE
PROTEIN: 30 mg/dL — AB
Specific Gravity, Urine: 1.012 (ref 1.005–1.030)
Squamous Epithelial / LPF: NONE SEEN
pH: 6 (ref 5.0–8.0)

## 2016-03-22 LAB — TROPONIN I: TROPONIN I: 0.04 ng/mL — AB (ref <0.03)

## 2016-03-22 LAB — PROCALCITONIN: Procalcitonin: 12.77 ng/mL

## 2016-03-22 LAB — I-STAT CG4 LACTIC ACID, ED
Lactic Acid, Venous: 3.72 mmol/L (ref 0.5–1.9)
Lactic Acid, Venous: 0.8 mmol/L (ref 0.5–1.9)

## 2016-03-22 LAB — PROTIME-INR
INR: 2.41
Prothrombin Time: 26.7 seconds — ABNORMAL HIGH (ref 11.4–15.2)

## 2016-03-22 LAB — EXPECTORATED SPUTUM ASSESSMENT W GRAM STAIN, RFLX TO RESP C: Special Requests: NORMAL

## 2016-03-22 LAB — LACTIC ACID, PLASMA: Lactic Acid, Venous: 1 mmol/L (ref 0.5–1.9)

## 2016-03-22 LAB — FIBRINOGEN: FIBRINOGEN: 688 mg/dL — AB (ref 210–475)

## 2016-03-22 LAB — MRSA PCR SCREENING: MRSA by PCR: NEGATIVE

## 2016-03-22 LAB — EXPECTORATED SPUTUM ASSESSMENT W REFEX TO RESP CULTURE

## 2016-03-22 LAB — BRAIN NATRIURETIC PEPTIDE: B Natriuretic Peptide: 298.3 pg/mL — ABNORMAL HIGH (ref 0.0–100.0)

## 2016-03-22 MED ORDER — LORAZEPAM 2 MG/ML IJ SOLN
0.5000 mg | INTRAMUSCULAR | Status: DC | PRN
  Administered 2016-03-23 (×2): 0.5 mg via INTRAVENOUS
  Filled 2016-03-22 (×2): qty 1

## 2016-03-22 MED ORDER — SODIUM CHLORIDE 0.9 % IV SOLN
INTRAVENOUS | Status: DC
  Administered 2016-03-22 (×2): via INTRAVENOUS

## 2016-03-22 MED ORDER — DIPHENHYDRAMINE HCL 50 MG/ML IJ SOLN
12.5000 mg | Freq: Four times a day (QID) | INTRAMUSCULAR | Status: DC | PRN
  Administered 2016-03-23 – 2016-03-24 (×4): 12.5 mg via INTRAVENOUS
  Filled 2016-03-22 (×4): qty 1

## 2016-03-22 MED ORDER — MUPIROCIN 2 % EX OINT
1.0000 "application " | TOPICAL_OINTMENT | Freq: Two times a day (BID) | CUTANEOUS | Status: DC
  Administered 2016-03-22 – 2016-03-24 (×3): 1 via TOPICAL
  Filled 2016-03-22: qty 22

## 2016-03-22 MED ORDER — DICYCLOMINE HCL 20 MG PO TABS
20.0000 mg | ORAL_TABLET | Freq: Four times a day (QID) | ORAL | Status: DC | PRN
  Filled 2016-03-22: qty 1

## 2016-03-22 MED ORDER — ORAL CARE MOUTH RINSE
15.0000 mL | Freq: Two times a day (BID) | OROMUCOSAL | Status: DC
  Administered 2016-03-24: 15 mL via OROMUCOSAL

## 2016-03-22 MED ORDER — OSMOLITE 1.5 CAL PO LIQD
237.0000 mL | Freq: Three times a day (TID) | ORAL | Status: DC
  Administered 2016-03-22 – 2016-03-23 (×3): 237 mL
  Filled 2016-03-22 (×3): qty 237

## 2016-03-22 MED ORDER — VANCOMYCIN HCL IN DEXTROSE 1-5 GM/200ML-% IV SOLN
1000.0000 mg | Freq: Once | INTRAVENOUS | Status: AC
  Administered 2016-03-22: 1000 mg via INTRAVENOUS
  Filled 2016-03-22: qty 200

## 2016-03-22 MED ORDER — SIMETHICONE 40 MG/0.6ML PO SUSP
80.0000 mg | Freq: Four times a day (QID) | ORAL | Status: DC
  Administered 2016-03-22 – 2016-03-24 (×8): 80 mg
  Filled 2016-03-22 (×9): qty 1.2

## 2016-03-22 MED ORDER — IPRATROPIUM-ALBUTEROL 0.5-2.5 (3) MG/3ML IN SOLN
3.0000 mL | Freq: Once | RESPIRATORY_TRACT | Status: AC
  Administered 2016-03-22: 3 mL via RESPIRATORY_TRACT
  Filled 2016-03-22: qty 3

## 2016-03-22 MED ORDER — SODIUM CHLORIDE 0.9 % IV BOLUS (SEPSIS)
1000.0000 mL | Freq: Once | INTRAVENOUS | Status: AC
  Administered 2016-03-22: 1000 mL via INTRAVENOUS

## 2016-03-22 MED ORDER — MIDODRINE HCL 5 MG PO TABS
5.0000 mg | ORAL_TABLET | Freq: Three times a day (TID) | ORAL | Status: DC
  Administered 2016-03-22 – 2016-03-24 (×3): 5 mg
  Filled 2016-03-22 (×5): qty 1

## 2016-03-22 MED ORDER — ENOXAPARIN SODIUM 40 MG/0.4ML ~~LOC~~ SOLN
40.0000 mg | SUBCUTANEOUS | Status: DC
  Administered 2016-03-22: 40 mg via SUBCUTANEOUS
  Filled 2016-03-22: qty 0.4

## 2016-03-22 MED ORDER — CHLORHEXIDINE GLUCONATE 0.12 % MT SOLN
15.0000 mL | Freq: Two times a day (BID) | OROMUCOSAL | Status: DC
  Administered 2016-03-22 – 2016-03-24 (×3): 15 mL via OROMUCOSAL
  Filled 2016-03-22 (×3): qty 15

## 2016-03-22 MED ORDER — PIPERACILLIN-TAZOBACTAM 3.375 G IVPB
3.3750 g | Freq: Three times a day (TID) | INTRAVENOUS | Status: DC
  Administered 2016-03-22 – 2016-03-24 (×7): 3.375 g via INTRAVENOUS
  Filled 2016-03-22 (×7): qty 50

## 2016-03-22 MED ORDER — HYDROMORPHONE HCL 2 MG/ML IJ SOLN: 0.2500 mg | INTRAMUSCULAR | Status: DC | PRN

## 2016-03-22 MED ORDER — DIPHENOXYLATE-ATROPINE 2.5-0.025 MG PO TABS: 2.0000 | ORAL_TABLET | Freq: Four times a day (QID) | ORAL | Status: DC | PRN

## 2016-03-22 MED ORDER — DEXTROSE 5 % IV SOLN
2.0000 g | Freq: Once | INTRAVENOUS | Status: AC
  Administered 2016-03-22: 2 g via INTRAVENOUS
  Filled 2016-03-22: qty 2

## 2016-03-22 MED ORDER — PHYTONADIONE 5 MG PO TABS
5.0000 mg | ORAL_TABLET | Freq: Once | ORAL | Status: AC
  Administered 2016-03-22: 5 mg
  Filled 2016-03-22: qty 1

## 2016-03-22 MED ORDER — SODIUM CHLORIDE 0.9 % IV SOLN: INTRAVENOUS | Status: DC

## 2016-03-22 MED ORDER — VANCOMYCIN HCL 500 MG IV SOLR
500.0000 mg | Freq: Two times a day (BID) | INTRAVENOUS | Status: DC
  Administered 2016-03-22 – 2016-03-24 (×4): 500 mg via INTRAVENOUS
  Filled 2016-03-22 (×5): qty 500

## 2016-03-22 MED ORDER — METHYLPREDNISOLONE SODIUM SUCC 40 MG IJ SOLR
40.0000 mg | Freq: Every day | INTRAMUSCULAR | Status: DC
  Administered 2016-03-22 – 2016-03-24 (×3): 40 mg via INTRAVENOUS
  Filled 2016-03-22 (×3): qty 1

## 2016-03-22 MED ORDER — TRIAMCINOLONE ACETONIDE 0.1 % EX OINT
1.0000 "application " | TOPICAL_OINTMENT | Freq: Two times a day (BID) | CUTANEOUS | Status: DC
  Administered 2016-03-22 – 2016-03-24 (×4): 1 via TOPICAL
  Filled 2016-03-22: qty 15

## 2016-03-22 MED ORDER — HYDROXYZINE HCL 25 MG PO TABS
25.0000 mg | ORAL_TABLET | Freq: Three times a day (TID) | ORAL | Status: DC | PRN
  Administered 2016-03-24: 25 mg
  Filled 2016-03-22 (×2): qty 1

## 2016-03-22 MED ORDER — DEXTROSE 5 % IV SOLN: 2.0000 g | Freq: Two times a day (BID) | INTRAVENOUS | Status: DC

## 2016-03-22 NOTE — ED Notes (Signed)
Patient denies pain and is resting comfortably.  

## 2016-03-22 NOTE — Progress Notes (Signed)
Pharmacy Antibiotic Note  [name] is a 70 y.o. male admitted on 03/22/2016 with pneumonia.  Pharmacy consulted for Vancomycin, Cefepime for HCAP. Cefepime changed to Zosyn for aspiration PNA  Plan: Vancomycin '500mg'$  IV every 12 hours.  Goal trough 15-20 mcg/mL.  Zosyn 3.375gm IV q8hr - 4 hr infusion  Height: '5\' 7"'$  (170.2 cm) Weight: 137 lb (62.1 kg) IBW/kg (Calculated) : 66.1  Temp (24hrs), Avg:98 F (36.7 C), Min:97.7 F (36.5 C), Max:98.3 F (36.8 C)   Recent Labs Lab 03/16/16 1114 03/19/16 1232 03/19/16 1816 03/20/16 0440 03/21/16 0422 03/22/16 0511 03/22/16 0521  WBC 7.2 12.6*  --  6.8 6.3 16.9*  --   CREATININE 1.1 1.5*  --  1.36* 1.06 1.27*  --   LATICACIDVEN  --   --  1.80  --   --   --  3.72*    Estimated Creatinine Clearance: 46.9 mL/min (by C-G formula based on SCr of 1.27 mg/dL (H)).    Allergies  Allergen Reactions  . Augmentin [Amoxicillin-Pot Clavulanate] Diarrhea and Other (See Comments)    .Has patient had a PCN reaction causing immediate rash, facial/tongue/throat swelling, SOB or lightheadedness with hypotension: No Has patient had a PCN reaction causing severe rash involving mucus membranes or skin necrosis: No Has patient had a PCN reaction that required hospitalization No Has patient had a PCN reaction occurring within the last 10 years: Yes If all of the above answers are "NO", then may proceed with Cephalosporin use.   . Fentanyl Other (See Comments)    Reaction:  Hallucinations   . Scopolamine Other (See Comments)    Reaction:  Hallucinations   . Zolpidem Tartrate Diarrhea  . Morphine And Related Itching  . Carboplatin Itching and Rash  . Levaquin [Levofloxacin] Other (See Comments)    Reaction:  Knee pain    Antimicrobials this admission: Vancomycin 12/25 >> Cefepime 12/25 >> 12/25 Zosyn 12/25 >>  Dose adjustments this admission:  Microbiology results: 12/25 BCx: sent 12/25 UCx: sent  12/23 prev admit Bile fluid: abundant  GNR, mod GPC  12/22 prev admit BCx: 1/2 GNR - no organism reported  Thank you for allowing pharmacy to be a part of this patient's care.  Minda Ditto PharmD Pager [phone] 03/22/2016, 8:53 AM

## 2016-03-22 NOTE — Progress Notes (Signed)
Pt remains on BIPAP/NIV at this time due to WOB.

## 2016-03-22 NOTE — H&P (Addendum)
History and Physical    [name] JIR:[id number] DOB: Nov 11, 1945 DOA: 03/22/2016  Referring MD/NP/PA:  Delora Fuel PCP: [name], MD  Outpatient Specialists: [facility]  Patient coming from: home  Chief Complaint:  SOB  HPI: [name] is a 70 y.o. 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 Despite a common bile duct drain and a percutaneous biliary drain he has had rising liver function tests and bilirubin over the last 2 months. He is also have problems with chronic diarrhea, abdominal distention resulting in some mild dyspnea. He was hospitalized from 12/22-12/24 due to sepsis from probable cholangitis and discharged home with home health services with anticipation of setting up hospice after the holiday. He was DO NOT RESUSCITATE/DO NOT INTUBATE.  He was doing well after he returned home last night, however he set up his tube feeds so that they would run while he was asleep. He does use a low wedge while running his tube feeds. He woke up suddenly in the middle the night [name] of breath with a very wet sounding cough. His wife encouraged him to come back to the emergency department for reevaluation.  He denied fevers, chills, vomiting.  He has been coughing up thick brown mucus ever since this event happened.  ED Course:  Afebrile, respirations 29 with respiratory distress, O2 sat 87% on room air. Blood pressure stable, mild tachycardia to the low 100s. His white count was 6.3 yesterday and 16.9 this morning.  His lactate was 3.72. CXR demonstrates groundglass opacities in the right perihilar lung and left upper lobe consistent with aspiration pneumonia, new from previous chest x-ray.  He was started on BiPAP in the emergency department and is being admitted to the stepdown unit for further treatment.  He was given a dose of vancomycin and cefepime in the ER.  Since yesterday, the anaerobic blood culture from 12/22 has  been reported as growing gram-negative rods. Bile culture is still pending.  Review of Systems:  General:  Denies fevers, chills, weight loss or gain HEENT:  Denies changes to hearing and vision, rhinorrhea, sinus congestion, sore throat CV:  Denies chest pain and palpitations, lower extremity edema.  PULM:  Positive SOB, wheezing, cough.   GI:  Denies nausea, vomiting, constipation, ongoing watery small-volume frequent diarrhea.  Having intermittent mild cramping left periumbilical abdominal pain GU:  Denies dysuria, frequency, urgency ENDO:  Denies polyuria, polydipsia.   HEME:  Denies hematemesis, blood in stools, melena, abnormal bruising or bleeding.  LYMPH:  Denies lymphadenopathy.   MSK:  Denies arthralgias, myalgias.   DERM:  Denies skin rash or ulcer.   NEURO:  Denies focal numbness, weakness, slurred speech, confusion, facial droop.  PSYCH:  Denies anxiety and depression.    Past Medical History:  Diagnosis Date  . Abnormality of gait 03/12/2015  . Acute meniscal tear of knee RIGHT KNEE  . Antineoplastic chemotherapy induced anemia 03/04/2016  . ARF (acute renal failure) ([place]) 03/2015   pre-renal patient was septic. is improved now.  . Arthritis RIGHT SHOULDER  . BPH (benign prostatic hypertrophy)   . Dizziness and giddiness 04/23/2014  . Gastrostomy tube dependent ([place])    Continous feedings per Gordy Councilman tube"unable to swallow"- Infusion pump for Nutrition  . HOH (hard of hearing)    bilateral hearing aids  . Hypertension    no longer an issue. Now runs usually low- tekes med to increase blood pressure  . Immature cataract BILATERAL  . Non-small cell lung  cancer ([place]) DX SEPT 2010  W/ CHEMORADIATION AT THAT TIME -- NOW  W/ METS--  CURRENTLY ON MAINTENANCE CHEMO TX  EVERY 65 DAYS   ONCOLOGIST- [facility]- lung cancer with metastasis.  . Orthostatic hypotension    B/P tends to run 110/70 or less -using med to raise B/P.  Marland Kitchen Pneumonia   . Radiation 01/08/09-01/21/09    Mediastinum 30 Gy x 12 fractions  . Radiation 08/19/14-09/04/14   Palliative RT Porta hepatic LN 30 Gy  . Sepsis ([place]) 03/2015  . Shortness of breath dyspnea    with exertion  . Swallowing impairment    "Unable to swallow"  . Transfusion history    last 1-2 yrs ago with chemotherapy  . Tremor 04/23/2014   Right hand    Past Surgical History:  Procedure Laterality Date  . BILATERAL EAR DRUM SURGERY  1960'S  . BILE DUCT STENT PLACEMENT    . CATARACT EXTRACTION Bilateral   . ERCP N/A 08/09/2014   Procedure: ENDOSCOPIC RETROGRADE CHOLANGIOPANCREATOGRAPHY (ERCP);  Surgeon: Carol Ada, MD;  Location: [facility];  Service: Endoscopy;  Laterality: N/A;  . ERCP N/A 05/23/2015   Procedure: ENDOSCOPIC RETROGRADE CHOLANGIOPANCREATOGRAPHY (ERCP);  Surgeon: Carol Ada, MD;  Location: [facility] ENDOSCOPY;  Service: Endoscopy;  Laterality: N/A;  . ERCP N/A 05/30/2015   Procedure: ENDOSCOPIC RETROGRADE CHOLANGIOPANCREATOGRAPHY (ERCP);  Surgeon: Carol Ada, MD;  Location: [facility];  Service: Endoscopy;  Laterality: N/A;  . ERCP N/A 02/18/2016   Procedure: ENDOSCOPIC RETROGRADE CHOLANGIOPANCREATOGRAPHY (ERCP);  Surgeon: Carol Ada, MD;  Location: [facility];  Service: Endoscopy;  Laterality: N/A;  . ESOPHAGOGASTRODUODENOSCOPY (EGD) WITH PROPOFOL N/A 08/09/2014   Procedure: ESOPHAGOGASTRODUODENOSCOPY (EGD) WITH PROPOFOL;  Surgeon: Carol Ada, MD;  Location: [facility] ENDOSCOPY;  Service: Endoscopy;  Laterality: N/A;  . EUS N/A 08/09/2014   Procedure: UPPER ENDOSCOPIC ULTRASOUND (EUS) RADIAL;  Surgeon: Carol Ada, MD;  Location: [facility] ENDOSCOPY;  Service: Endoscopy;  Laterality: N/A;  . FINE NEEDLE ASPIRATION N/A 08/09/2014   Procedure: FINE NEEDLE ASPIRATION (FNA) LINEAR;  Surgeon: Carol Ada, MD;  Location: [facility] ENDOSCOPY;  Service: Endoscopy;  Laterality: N/A;  . GASTROSTOMY TUBE PLACEMENT  10/2014  . IR GENERIC HISTORICAL  03/02/2016   IR FLUORO RM 30-60 MIN 03/02/2016 Corrie Mckusick, DO [facility]INTER[facility]  . IR  GENERIC HISTORICAL  03/12/2016   IR INT EXT BILIARY DRAIN WITH CHOLANGIOGRAM 03/12/2016 Corrie Mckusick, DO [facility]  . IR GENERIC HISTORICAL  02/25/2016   IR RADIOLOGIST EVAL & MGMT 02/25/2016 Corrie Mckusick, DO [facility]  . IR GENERIC HISTORICAL  03/19/2016   IR EXCHANGE BILIARY DRAIN 03/19/2016 Greggory Keen, MD [facility]  . KNEE ARTHROSCOPY  12/21/2011   Procedure: ARTHROSCOPY KNEE;  Surgeon: Tobi Bastos, MD;  Location: [facility];  Service: Orthopedics;  Laterality: Right;  WITH MEDIAL MENISECTOMY  . LARYNGOSCOPY  01/2015 and 04/21/15  . ORIF LEFT RING FINGER  AND REVASCULARIZATION OF RADIAL SIDE  02-16-2007   DEGLOVING INJURY  . [place] cath    . PORTACATH PLACEMENT  Removed 06/20/3015   as of 05-27-15 remains right chest. 02-16-16 Port has been removed now.  Marland Kitchen ROTATOR CUFF REPAIR  2008   LEFT SHOULDER  . SHOULDER OPEN ROTATOR CUFF REPAIR Right 06/05/2014   Procedure: RIGHT ROTATOR CUFF REPAIR SHOULDER OPEN;  Surgeon: Latanya Maudlin, MD;  Location: [facility];  Service: Orthopedics;  Laterality: Right;  . TONSILLECTOMY     as child     reports that he quit smoking about 35 years ago.  His smoking use included Cigarettes. He has a 40.00 pack-year smoking history. He has never used smokeless tobacco. He reports that he drinks about 4.8 oz of alcohol per week . He reports that he does not use drugs.  Allergies  Allergen Reactions  . Augmentin [Amoxicillin-Pot Clavulanate] Diarrhea and Other (See Comments)    .Has patient had a PCN reaction causing immediate rash, facial/tongue/throat swelling, SOB or lightheadedness with hypotension: No Has patient had a PCN reaction causing severe rash involving mucus membranes or skin necrosis: No Has patient had a PCN reaction that required hospitalization No Has patient had a PCN reaction occurring within the last 10 years: Yes If all of the above answers are "NO", then may proceed with Cephalosporin use.   . Fentanyl Other  (See Comments)    Reaction:  Hallucinations   . Scopolamine Other (See Comments)    Reaction:  Hallucinations   . Zolpidem Tartrate Diarrhea  . Morphine And Related Itching  . Carboplatin Itching and Rash  . Levaquin [Levofloxacin] Other (See Comments)    Reaction:  Knee pain     Family History  Problem Relation Age of Onset  . Congestive Heart Failure Mother   . Cancer Sister     colon cancer  . Cancer Maternal Grandfather   . Colon cancer Father 88  . Lung cancer Father   . Heart attack Brother   . Colon cancer    . Heart attack    . Cancer    . Hypertension    . Hyperlipidemia      Prior to Admission medications   Medication Sig Start Date End Date Taking? Authorizing Provider  amoxicillin-clavulanate (AUGMENTIN) 875-125 MG tablet Take 1 tablet by mouth 2 (two) times daily. 03/21/16 03/27/16 Yes Janece Canterbury, MD  aspirin 325 MG tablet Place 650 mg into feeding tube every 6 (six) hours as needed for mild pain.    Yes Historical Provider, MD  b complex vitamins tablet Place 1 tablet into feeding tube daily.    Yes Historical Provider, MD  dicyclomine (BENTYL) 20 MG tablet Place 20 mg into feeding tube every 6 (six) hours as needed (for abdominal cramps).    Yes Historical Provider, MD  diphenhydramine-acetaminophen (TYLENOL PM) 25-500 MG TABS tablet Place 1 tablet into feeding tube at bedtime as needed (for sleep).    Yes Historical Provider, MD  diphenoxylate-atropine (LOMOTIL) 2.5-0.025 MG tablet Place 2 tablets into feeding tube 4 (four) times daily as needed for diarrhea or loose stools.   Yes Historical Provider, MD  hydrOXYzine (ATARAX/VISTARIL) 25 MG tablet Place 25 mg into feeding tube 3 (three) times daily as needed for itching.    Yes Historical Provider, MD  LORazepam (ATIVAN) 1 MG tablet Take 0.5-1 mg by mouth every 4 (four) hours as needed for anxiety.   Yes Historical Provider, MD  metoCLOPramide (REGLAN) 5 MG/5ML solution Place 5 mLs (5 mg total) into feeding  tube every 6 (six) hours as needed for nausea or vomiting (abdominal distension). 03/21/16  Yes Janece Canterbury, MD  midodrine (PROAMATINE) 5 MG tablet Place 5 mg into feeding tube 3 (three) times daily with meals.    Yes Historical Provider, MD  Multiple Vitamin (MULTIVITAMIN WITH MINERALS) TABS tablet Place 1 tablet into feeding tube daily.    Yes Historical Provider, MD  mupirocin ointment (BACTROBAN) 2 % Apply 1 application topically 2 (two) times daily.    Yes Historical Provider, MD  Nutritional Supplements (FEEDING SUPPLEMENT, OSMOLITE 1.5 CAL,) LIQD Place 237  mLs into feeding tube 3 (three) times daily.    Yes Historical Provider, MD  Nutritional Supplements (TWOCAL HN) LIQD Place 237 mLs into feeding tube 3 (three) times daily.   Yes Historical Provider, MD  omeprazole (PRILOSEC) 40 MG capsule 40 mg daily as needed (per tube for acid reflux).    Yes Historical Provider, MD  ondansetron (ZOFRAN) 8 MG tablet Place 8 mg into feeding tube every 8 (eight) hours as needed for nausea or vomiting.    Yes Historical Provider, MD  oxyCODONE (ROXICODONE) 5 MG/5ML solution Take 10 mLs (10 mg total) by mouth every 4 (four) hours as needed for moderate pain or severe pain. 03/21/16  Yes Janece Canterbury, MD  promethazine (PHENERGAN) 25 MG tablet Place 25 mg into feeding tube every 6 (six) hours as needed for nausea or vomiting.    Yes Historical Provider, MD  senna (SENOKOT) 8.6 MG tablet Place 1 tablet into feeding tube daily as needed for constipation.    Yes Historical Provider, MD  simethicone (MYLICON) 40 JK/0.9FG drops Place 1.2 mLs (80 mg total) into feeding tube 4 (four) times daily. 03/21/16  Yes Janece Canterbury, MD  triamcinolone ointment (KENALOG) 0.1 % Apply 1 application topically 2 (two) times daily. Pt applies to both feet.   Yes Historical Provider, MD  vitamin C (ASCORBIC ACID) 500 MG tablet Place 500 mg into feeding tube daily.    Yes Historical Provider, MD    Physical Exam: Vitals:    03/22/16 0508 03/22/16 0612 03/22/16 0630 03/22/16 0732  BP:  110/70 122/85 116/78  Pulse:  94 95 99  Resp:  23 (!) 31 (!) 29  Temp:      TempSrc:      SpO2:  94% 95% 92%  Weight: 62.1 kg (137 lb)     Height: '5\' 7"'$  (1.702 m)       Constitutional: Jaundiced cachectic male,  persistent SCM retractions despite BiPAP Eyes: PERRL, lids normal, scleral icterus ENMT: Normal cephalic atraumatic, OP exam Deferred while patient on BiPAP with ongoing respiratory distress  Neck: normal, supple, no masses, no thyromegaly Respiratory: Coarse rales and rhonchi bilateral bases and at the left apex, no wheezes heard well on BiPAP Cardiovascular: Mild tachycardia, regular rhythm no murmurs / rubs / gallops. No extremity edema. 2+ pedal pulses. No carotid bruits.  Abdomen: Hyperactive bowel sounds, moderately distended but not tense, mild tenderness to palpation just to the left of the umbilicus without rebound or guarding Musculoskeletal: no clubbing / cyanosis. No joint deformity upper and lower extremities. Good ROM, no contractures. Normal muscle tone.  Skin: no rashes, lesions, ulcers. No induration Neurologic: CN 2-12 grossly intact. Sensation intact, DTR normal. Strength 5/5 in all 4.  Psychiatric: Normal judgment and insight. Alert and oriented x 3. Normal mood.   Labs on Admission: I have personally reviewed following labs and imaging studies  CBC:  Recent Labs Lab 03/16/16 1114 03/19/16 1232 03/20/16 0440 03/21/16 0422 03/22/16 0511  WBC 7.2 12.6* 6.8 6.3 16.9*  NEUTROABS 5.7 10.2*  --   --  14.9*  HGB 11.6* 11.0* 9.1* 9.4* 10.6*  HCT 34.8* 31.4* 26.0* 27.2* 30.1*  MCV 105.9* 101.9* 101.6* 101.9* 102.4*  PLT 134* 149 113* 117* 182   Basic Metabolic Panel:  Recent Labs Lab 03/16/16 1114 03/19/16 1232 03/20/16 0440 03/21/16 0422 03/22/16 0511  NA 138 135* 141 140 143  K 4.8 4.3 3.9 4.2 4.2  CL  --   --  116* 113* 113*  CO2  22 20* 18* 19* 20*  GLUCOSE 128 100 143* 154* 142*    BUN 30.5* 50.1* 42* 31* 32*  CREATININE 1.1 1.5* 1.36* 1.06 1.27*  CALCIUM 9.5 9.1 8.1* 8.1* 8.7*   GFR: Estimated Creatinine Clearance: 46.9 mL/min (by C-G formula based on SCr of 1.27 mg/dL (H)). Liver Function Tests:  Recent Labs Lab 03/16/16 1114 03/19/16 1232 03/20/16 0440 03/21/16 0422 03/22/16 0511  AST 67* 97* 93* 118* 127*  ALT 96* 100* 83* 100* 120*  ALKPHOS 239* 266* 236* 328* 373*  BILITOT 8.13* 9.77* 10.1* 10.2* 13.8*  PROT 6.6 6.4 5.0* 5.2* 6.0*  ALBUMIN 2.5* 2.7* 2.5* 2.5* 2.6*    Recent Labs Lab 03/19/16 1805  LIPASE 13    Recent Labs Lab 03/21/16 0917  AMMONIA <9*   Coagulation Profile:  Recent Labs Lab 03/22/16 0511  INR 2.41   Cardiac Enzymes:  Recent Labs Lab 03/22/16 0511  TROPONINI 0.04*   BNP (last 3 results) No results for input(s): PROBNP in the last 8760 hours. HbA1C: No results for input(s): HGBA1C in the last 72 hours. CBG: No results for input(s): GLUCAP in the last 168 hours. Lipid Profile: No results for input(s): CHOL, HDL, LDLCALC, TRIG, CHOLHDL, LDLDIRECT in the last 72 hours. Thyroid Function Tests:  Recent Labs  03/19/16 1233  TSH 2.964   Anemia Panel: No results for input(s): VITAMINB12, FOLATE, FERRITIN, TIBC, IRON, RETICCTPCT in the last 72 hours. Urine analysis:    Component Value Date/Time   COLORURINE AMBER (A) 03/22/2016 0707   APPEARANCEUR CLEAR 03/22/2016 0707   LABSPEC 1.012 03/22/2016 0707   PHURINE 6.0 03/22/2016 0707   GLUCOSEU NEGATIVE 03/22/2016 0707   HGBUR NEGATIVE 03/22/2016 0707   BILIRUBINUR SMALL (A) 03/22/2016 0707   KETONESUR NEGATIVE 03/22/2016 0707   PROTEINUR 30 (A) 03/22/2016 0707   UROBILINOGEN 0.2 01/28/2015 1000   NITRITE NEGATIVE 03/22/2016 0707   LEUKOCYTESUR NEGATIVE 03/22/2016 0707   Sepsis Labs: '@LABRCNTIP'$ (procalcitonin:4,lacticidven:4) ) Recent Results (from the past 240 hour(s))  Culture, Blood     Status: None (Preliminary result)   Collection Time:  03/19/16 12:33 PM  Result Value Ref Range Status   BLOOD CULTURE, ROUTINE Preliminary report  Preliminary   RESULT 1 Comment  Preliminary    Comment: No growth in 36 - 48 hours.  Culture, blood (single) w Reflex to ID Panel     Status: Abnormal (Preliminary result)   Collection Time: 03/19/16 12:38 PM  Result Value Ref Range Status   BLOOD CULTURE, ROUTINE Preliminary report (A)  Preliminary   RESULT 1 Gram negative rods (A)  Preliminary    Comment: Recovered from anaerobic bottle only.   RESULT 2 Comment  Preliminary    Comment: No growth detected at this time. Recovered from aerobic bottle only.   Urine culture     Status: None   Collection Time: 03/19/16  5:47 PM  Result Value Ref Range Status   Specimen Description URINE, RANDOM  Final   Special Requests NONE  Final   Culture NO GROWTH Performed at [facility]   Final   Report Status 03/21/2016 FINAL  Final  Gastrointestinal Panel by PCR , Stool     Status: None   Collection Time: 03/19/16  6:12 PM  Result Value Ref Range Status   Campylobacter species NOT DETECTED NOT DETECTED Final   Plesimonas shigelloides NOT DETECTED NOT DETECTED Final   Salmonella species NOT DETECTED NOT DETECTED Final   Yersinia enterocolitica NOT DETECTED NOT DETECTED Final  Vibrio species NOT DETECTED NOT DETECTED Final   Vibrio cholerae NOT DETECTED NOT DETECTED Final   Enteroaggregative E coli (EAEC) NOT DETECTED NOT DETECTED Final   Enteropathogenic E coli (EPEC) NOT DETECTED NOT DETECTED Final   Enterotoxigenic E coli (ETEC) NOT DETECTED NOT DETECTED Final   Shiga like toxin producing E coli (STEC) NOT DETECTED NOT DETECTED Final   Shigella/Enteroinvasive E coli (EIEC) NOT DETECTED NOT DETECTED Final   Cryptosporidium NOT DETECTED NOT DETECTED Final   Cyclospora cayetanensis NOT DETECTED NOT DETECTED Final   Entamoeba histolytica NOT DETECTED NOT DETECTED Final   Giardia lamblia NOT DETECTED NOT DETECTED Final   Adenovirus F40/41  NOT DETECTED NOT DETECTED Final   Astrovirus NOT DETECTED NOT DETECTED Final   Norovirus GI/GII NOT DETECTED NOT DETECTED Final   Rotavirus A NOT DETECTED NOT DETECTED Final   Sapovirus (I, II, IV, and V) NOT DETECTED NOT DETECTED Final  Body fluid culture     Status: None (Preliminary result)   Collection Time: 03/20/16  1:50 PM  Result Value Ref Range Status   Specimen Description BILE  Final   Special Requests Normal  Final   Gram Stain   Final    NO WBC SEEN ABUNDANT GRAM NEGATIVE RODS MODERATE GRAM POSITIVE COCCI IN PAIRS RARE GRAM POSITIVE RODS    Culture   Final    CULTURE REINCUBATED FOR BETTER GROWTH Performed at [facility]    Report Status PENDING  Incomplete     Radiological Exams on Admission: US Abdomen Limited  Result Date: 03/21/2016 CLINICAL DATA:  Elevated liver function tests. EXAM: LIMITED ABDOMEN ULTRASOUND FOR ASCITES TECHNIQUE: Limited ultrasound survey for ascites was performed in all four abdominal quadrants. COMPARISON:  CT of 03/10/2016 FINDINGS: Trace ascites identified, primarily in the right lower quadrant. IMPRESSION: Trace ascites, insufficient for paracentesis. Electronically Signed   By: Abigail Miyamoto M.D.   On: 03/21/2016 16:06   Dg Chest Port 1 View  Result Date: 03/22/2016 CLINICAL DATA:  Shortness of breath.  Awoke with cough. EXAM: PORTABLE CHEST 1 VIEW COMPARISON:  Radiographs 03/19/2016 FINDINGS: New from prior exam patchy and ground-glass opacities in the left upper lung zone and right perihilar lung. There is probable pleural thickening or fluid laterally in the lower right lung. Chronic changes in the right paramediastinal region are again seen. No evidence of pneumothorax. Normal heart size. Stable osseous structures. IMPRESSION: Patchy and ground-glass opacities in the right perihilar lung and left upper lobe, may be pneumonia or aspiration, new from exam 3 days prior. Electronically Signed   By: Jeb Levering M.D.   On:  03/22/2016 05:55   Dg Abd Portable 1v  Result Date: 03/21/2016 CLINICAL DATA:  Abdominal distention. EXAM: PORTABLE ABDOMEN - 1 VIEW COMPARISON:  CT 03/10/2016 FINDINGS: Percutaneous gastrostomy tube over the stomach in the left upper quadrant. Biliary stent over the right upper quadrant unchanged. Interval placement of pigtail drainage catheter over the right upper quadrant which pigtail adjacent the distal end of the biliary stent. Bowel gas pattern demonstrates several air-filled nondilated large and small bowel loops. No free peritoneal air. There are degenerative changes of the spine and hips. IMPRESSION: Nonobstructive bowel gas pattern. Tubes and lines as described. Electronically Signed   By: Marin Olp M.D.   On: 03/21/2016 11:33   US Abdomen Limited Ruq  Result Date: 03/21/2016 CLINICAL DATA:  Elevated LFTs/bilirubin EXAM: US ABDOMEN LIMITED - RIGHT UPPER QUADRANT COMPARISON:  CT abdomen/ pelvis dated 03/10/2016 FINDINGS: Gallbladder: Gallbladder is  poorly visualized with suspected gallstones and dirty shadowing related to gas within the gallbladder lumen. This is better visualized on CT. Common bile duct: Diameter: 4 mm Liver: Coarse hepatic echotexture.  Pneumobilia. IMPRESSION: Pneumobilia with gas within the gallbladder lumen, likely related to the patient's known indwelling bile duct stent. Cholelithiasis. No gallbladder distention or additional findings specific for acute cholecystitis. Electronically Signed   By: Julian Hy M.D.   On: 03/21/2016 14:56    EKG: Independently reviewed. Low voltage sinus tachycardia, similar to prior, no acute ST segment changes  Assessment/Plan Principal Problem:   Aspiration pneumonia (HCC) Active Problems:   Bronchogenic cancer of left lung (HCC)   Biliary obstruction   Sepsis (HCC)   Gram-negative bacteremia   Diarrhea   HCAP (healthcare-associated pneumonia)   Acute respiratory failure with hypoxia (HCC)   Acute respiratory  failure with hypoxia and sepsis (tachycardic, tachypnea, leukocytosis, elevated lactic acid) due to Aspiration pneumonia as suggested by patient history.   -  Check MRSA PCR and d/c vancomycin if negative -  Change cefepime to zosyn -  Start steroids (both for liver and for pna) solumedrol '40mg'$  IV daily -  D/c PPI -  IVF  -  Repeat lactic acid in 3 hours -  F/u blood cultures from 12/25  -  F/u speciation and sensitivities from blood culture from 12/22 -  Aspiration precautions -  Stepdown for bipap -  Check ABG due to ongoing respiratory distress and again in 1 hour  GNR bacteremia and bile duct infection/cholangitis -  abx as above -  F/u sensitivities and speciation from cultures on 12/22 and 12/23  Biliary obstruction resulting from complications of non-small cell lung cancer, metastatic, IR and GI have nothing further to offer at this time.  -  LFTs and bilirubin continue to rise -  Repeat CMP in AM  Elevated INR: likely due to progressive liver failure but may also be sign of early DIC -  Check fibrinogen -  Vitamin K '5mg'$  per tube once -  Repeat INR in AM  Non-small cell lung cancer, metastatic and awaiting enrollment in hospice.   -  Dr. 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    DVT prophylaxis: lovenox  Code Status: DNR Family Communication: patient and wife  Disposition Plan:  Unclear at this point due to severity of underlying illness.  May go home with hospice in several days in best case  scenario vs. In hospital death.    Consults called: Dr. [name], Oncology, aware of admission  Admission status: inpatient, stepdown unit   Janece Canterbury MD [facility] Pager [phone]  If 7PM-7AM, please contact night-coverage [email or web address] Password [email or web address]  03/22/2016, 9:06 AM

## 2016-03-22 NOTE — Progress Notes (Signed)
DIAGNOSIS: Metastatic non-small cell lung cancer, adenocarcinoma diagnosed in September 2010. PDL 1 expression 50%.   PRIOR THERAPY: #1 status post 6 cycles of systemic chemotherapy with carboplatin, Alimta and Avastin given every 3 weeks last dose given 04/23/2009 with disease stabilization.  #2 status post palliative radiotherapy to the mediastinum under the care of Dr. Pablo Ledger. The patient received a total dose of 3000 cGY and 12 fractions completed 01/21/2009.  #3 Maintenance chemotherapy with Alimta at 500 mg per meter squared and Avastin at 15 mg per kilogram given every 3 weeks status post 35 cycles.  #4 Maintenance chemotherapy with Alimta 500 mg/M2 and Avastin 15 mg/kg every 4 weeks,status post 17 cycles, discontinued secondary to disease progression.  #5 Systemic chemotherapy with carboplatin for AUC of 5, Alimta 500 mg/M2 and Avastin 15 mg/kg every 3 weeks, status post 3 cycles, last cycle was given 12/28/2012. Carboplatin was discontinued starting cycle #2 secondary to hypersensitivity reaction. #6 Maintenance chemotherapy again with Alimta 500 mg/M2 and Avastin 15 mg/kg every 3 weeks, first cycle 01/23/2013. Status post 6 cycles. #7 palliative radiotherapy to the large abdominal mass under the care of Dr. Pablo Ledger. #8 Immunotherapy with Nivolumab 3 MG/KG every 2 weeks. First dose 10/04/2014. Status post 3 cycles discontinued secondary to disease progression. #9. Status post palliative radiotherapy to the progressive retroperitoneal lymph nodes under the care of Dr. Sondra Come.  CURRENT THERAPY: Ketruda 200 mg IV every 3 weeks, status post 4 cycles. First dose was given 11/20/2015. Treatment is currently on hold secondary to hyper bilirubinemia.  Subjective: The patient is seen and examined today at the emergency department. He came today complaining of increasing fatigue and weakness as well as shortness of breath started last night. There is concern that the patient may have  aspirated from his PEG tube feeding. He was discharged from the hospital yesterday. He was started on treatment with [facility] for 4 cycles and has been tolerating his treatment well except recently when his serum bilirubin has increased significantly over the last few weeks. He has a stent replacement done by gastroenterology as well as interventional radiology was initial improvement and worsening of his condition. He also continues to have persistent diarrhea. He has no current fever or chills.  Objective: Vital signs in last 24 hours: Temp:  [97.7 F (36.5 C)-98.3 F (36.8 C)] 97.7 F (36.5 C) (12/25 0507) Pulse Rate:  [94-109] 99 (12/25 0915) Resp:  [17-31] 22 (12/25 1100) BP: (99-122)/(67-85) 99/67 (12/25 1100) SpO2:  [87 %-95 %] 94 % (12/25 0915) FiO2 (%):  [40 %] 40 % (12/25 0855) Weight:  [137 lb (62.1 kg)] 137 lb (62.1 kg) (12/25 0508)  Intake/Output from previous day: No intake/output data recorded. Intake/Output this shift: Total I/O In: 1200 [IV Piggyback:1200] Out: -   General appearance: alert, cooperative, fatigued and mild distress Resp: rales bilaterally Cardio: regular rate and rhythm, S1, S2 normal, no murmur, click, rub or gallop GI: soft, non-tender; bowel sounds normal; no masses,  no organomegaly Extremities: extremities normal, atraumatic, no cyanosis or edema  Lab Results:   Recent Labs  03/21/16 0422 03/22/16 0511  WBC 6.3 16.9*  HGB 9.4* 10.6*  HCT 27.2* 30.1*  PLT 117* 152   BMET  Recent Labs  03/21/16 0422 03/22/16 0511  NA 140 143  K 4.2 4.2  CL 113* 113*  CO2 19* 20*  GLUCOSE 154* 142*  BUN 31* 32*  CREATININE 1.06 1.27*  CALCIUM 8.1* 8.7*    Studies/Results: US Abdomen Limited  Result Date:  03/21/2016 CLINICAL DATA:  Elevated liver function tests. EXAM: LIMITED ABDOMEN ULTRASOUND FOR ASCITES TECHNIQUE: Limited ultrasound survey for ascites was performed in all four abdominal quadrants. COMPARISON:  CT of 03/10/2016 FINDINGS:  Trace ascites identified, primarily in the right lower quadrant. IMPRESSION: Trace ascites, insufficient for paracentesis. Electronically Signed   By: Abigail Miyamoto M.D.   On: 03/21/2016 16:06   Dg Chest Port 1 View  Result Date: 03/22/2016 CLINICAL DATA:  Shortness of breath.  Awoke with cough. EXAM: PORTABLE CHEST 1 VIEW COMPARISON:  Radiographs 03/19/2016 FINDINGS: New from prior exam patchy and ground-glass opacities in the left upper lung zone and right perihilar lung. There is probable pleural thickening or fluid laterally in the lower right lung. Chronic changes in the right paramediastinal region are again seen. No evidence of pneumothorax. Normal heart size. Stable osseous structures. IMPRESSION: Patchy and ground-glass opacities in the right perihilar lung and left upper lobe, may be pneumonia or aspiration, new from exam 3 days prior. Electronically Signed   By: Jeb Levering M.D.   On: 03/22/2016 05:55   Dg Abd Portable 1v  Result Date: 03/21/2016 CLINICAL DATA:  Abdominal distention. EXAM: PORTABLE ABDOMEN - 1 VIEW COMPARISON:  CT 03/10/2016 FINDINGS: Percutaneous gastrostomy tube over the stomach in the left upper quadrant. Biliary stent over the right upper quadrant unchanged. Interval placement of pigtail drainage catheter over the right upper quadrant which pigtail adjacent the distal end of the biliary stent. Bowel gas pattern demonstrates several air-filled nondilated large and small bowel loops. No free peritoneal air. There are degenerative changes of the spine and hips. IMPRESSION: Nonobstructive bowel gas pattern. Tubes and lines as described. Electronically Signed   By: Marin Olp M.D.   On: 03/21/2016 11:33   US Abdomen Limited Ruq  Result Date: 03/21/2016 CLINICAL DATA:  Elevated LFTs/bilirubin EXAM: US ABDOMEN LIMITED - RIGHT UPPER QUADRANT COMPARISON:  CT abdomen/ pelvis dated 03/10/2016 FINDINGS: Gallbladder: Gallbladder is poorly visualized with suspected gallstones  and dirty shadowing related to gas within the gallbladder lumen. This is better visualized on CT. Common bile duct: Diameter: 4 mm Liver: Coarse hepatic echotexture.  Pneumobilia. IMPRESSION: Pneumobilia with gas within the gallbladder lumen, likely related to the patient's known indwelling bile duct stent. Cholelithiasis. No gallbladder distention or additional findings specific for acute cholecystitis. Electronically Signed   By: Julian Hy M.D.   On: 03/21/2016 14:56    Medications: I have reviewed the patient's current medications.  Assessment/Plan: 1) metastatic non-small cell lung cancer, adenocarcinoma status post several chemotherapy regimens and radiation and most recently treated with immunotherapy with Nat Math (pembrolizumab) status post 4 cycles. His treatment is current on hold secondary to persistent diarrhea as well as significant elevation of his liver enzymes. This is likely secondary to disease progression and biliary obstruction but immunotherapy induced hepatitis cannot be excluded at this point. I recommended for the patient to start treatment with steroids equivalent to prednisone 1 mg/kg on daily basis. We will continue on this treatment for at least 2 weeks before consideration of tapering his dose if his condition improves. 2) shortness of breath: Secondary to aspiration pneumonia. I agree with starting the patient on Zosyn and vancomycin. 3) persistent diarrhea: This could be also immunotherapy induced colitis. He will start high-dose steroid treatment today. 4) prognosis: Unfortunately poor at this point. Thank you so much for taking good care of Mr. Genet, I will continue to follow up the patient with you and assist in his management on as-needed basis.  LOS: 0 days    Whitlee Sluder K. 03/22/2016

## 2016-03-22 NOTE — ED Notes (Signed)
Hospitalist at bedside 

## 2016-03-22 NOTE — ED Provider Notes (Signed)
[facility] DEPT Provider Note   CSN: [id number] Arrival date & time: 03/22/16  0459     History   Chief Complaint Chief Complaint  Patient presents with  . Shortness of Breath    HPI [name] is a 70 y.o. male.  He is currently being treated for metastatic lung cancer with liver failure and was discharged from hospital yesterday on hospice care. At about 1:30 AM, his wife noted that he was having gurgling respirations. He is complaining of dyspnea. Denies any chest pain. There has been a slight cough which is nonproductive. There is been no vomiting or diarrhea. His wife confirms that he is DO NOT RESUSCITATE. He is fed through a PEG tube and also has an external biliary drain.   The history is provided by the patient and the spouse.  Shortness of Breath     Past Medical History:  Diagnosis Date  . Abnormality of gait 03/12/2015  . Acute meniscal tear of knee RIGHT KNEE  . Antineoplastic chemotherapy induced anemia 03/04/2016  . ARF (acute renal failure) ([place]) 03/2015   pre-renal patient was septic. is improved now.  . Arthritis RIGHT SHOULDER  . BPH (benign prostatic hypertrophy)   . Dizziness and giddiness 04/23/2014  . Gastrostomy tube dependent ([place])    Continous feedings per Gordy Councilman tube"unable to swallow"- Infusion pump for Nutrition  . HOH (hard of hearing)    bilateral hearing aids  . Hypertension    no longer an issue. Now runs usually low- tekes med to increase blood pressure  . Immature cataract BILATERAL  . Non-small cell lung cancer ([place]) DX SEPT 2010  W/ CHEMORADIATION AT THAT TIME -- NOW  W/ METS--  CURRENTLY ON MAINTENANCE CHEMO TX  EVERY 7 DAYS   ONCOLOGIST- DR [facility]- lung cancer with metastasis.  . Orthostatic hypotension    B/P tends to run 110/70 or less -using med to raise B/P.  Marland Kitchen Pneumonia   . Radiation 01/08/09-01/21/09   Mediastinum 30 Gy x 12 fractions  . Radiation 08/19/14-09/04/14   Palliative RT Porta hepatic LN 30 Gy  .  Sepsis ([place]) 03/2015  . Shortness of breath dyspnea    with exertion  . Swallowing impairment    "Unable to swallow"  . Transfusion history    last 1-2 yrs ago with chemotherapy  . Tremor 04/23/2014   Right hand    Patient Active Problem List   Diagnosis Date Noted  . Cholangitis 03/21/2016  . Diarrhea 03/18/2016  . Antineoplastic chemotherapy induced anemia 03/04/2016  . Rash 12/19/2015  . Neck pain 12/19/2015  . Chronic fatigue 11/10/2015  . Bacteremia due to Klebsiella pneumoniae 04/29/2015  . Infection due to Enterobacter cloacae 04/29/2015  . Gram-negative bacteremia 04/25/2015  . Septic shock ([place]) 04/23/2015  . Sepsis ([place])   . Acute kidney injury ([place])   . Abnormality of gait 03/12/2015  . Protein-calorie malnutrition, severe ([place]) 11/26/2014  . Hypoalbuminemia due to protein-calorie malnutrition ([place]) 11/18/2014  . Leukocytosis 11/18/2014  . Dysphagia 11/18/2014  . Dehydration 10/03/2014  . Central line complication 74/02/8785  . Anorexia 10/03/2014  . Weight loss 10/03/2014  . Transaminitis 10/03/2014  . Hyperphosphatemia 10/03/2014  . Encounter for antineoplastic immunotherapy 09/28/2014  . Non-small cell lung cancer ([place]) 09/21/2014  . 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 Biliary obstruction   . Fatigue 08/27/2014  . Abdominal mass   . CRF (chronic renal failure) 08/07/2014  . Obstructive jaundice 08/07/2014  . Malnutrition of moderate degree ([place])  08/07/2014  . Obstructive jaundice due to cancer ([place]) 08/06/2014  . Rotator cuff tear, non-traumatic 06/05/2014  . Dizziness and giddiness 04/23/2014  . Tremor 04/23/2014  . Acute on chronic renal failure ([place]) 05/24/2013  . Pancytopenia ([place]) 05/24/2013  . Osteoarthritis of right knee 12/21/2011  . Meniscus, medial, posterior horn derangement 12/21/2011  . Dyspnea 12/04/2010  . Bronchogenic cancer of left lung ([place]) 12/11/2008  . ARTHRITIS 12/10/2008    Past Surgical  History:  Procedure Laterality Date  . BILATERAL EAR DRUM SURGERY  1960'S  . BILE DUCT STENT PLACEMENT    . CATARACT EXTRACTION Bilateral   . ERCP N/A 08/09/2014   Procedure: ENDOSCOPIC RETROGRADE CHOLANGIOPANCREATOGRAPHY (ERCP);  Surgeon: Carol Ada, MD;  Location: [facility];  Service: Endoscopy;  Laterality: N/A;  . ERCP N/A 05/23/2015   Procedure: ENDOSCOPIC RETROGRADE CHOLANGIOPANCREATOGRAPHY (ERCP);  Surgeon: Carol Ada, MD;  Location: [facility] ENDOSCOPY;  Service: Endoscopy;  Laterality: N/A;  . ERCP N/A 05/30/2015   Procedure: ENDOSCOPIC RETROGRADE CHOLANGIOPANCREATOGRAPHY (ERCP);  Surgeon: Carol Ada, MD;  Location: [facility];  Service: Endoscopy;  Laterality: N/A;  . ERCP N/A 02/18/2016   Procedure: ENDOSCOPIC RETROGRADE CHOLANGIOPANCREATOGRAPHY (ERCP);  Surgeon: Carol Ada, MD;  Location: [facility];  Service: Endoscopy;  Laterality: N/A;  . ESOPHAGOGASTRODUODENOSCOPY (EGD) WITH PROPOFOL N/A 08/09/2014   Procedure: ESOPHAGOGASTRODUODENOSCOPY (EGD) WITH PROPOFOL;  Surgeon: Carol Ada, MD;  Location: [facility] ENDOSCOPY;  Service: Endoscopy;  Laterality: N/A;  . EUS N/A 08/09/2014   Procedure: UPPER ENDOSCOPIC ULTRASOUND (EUS) RADIAL;  Surgeon: Carol Ada, MD;  Location: [facility] ENDOSCOPY;  Service: Endoscopy;  Laterality: N/A;  . FINE NEEDLE ASPIRATION N/A 08/09/2014   Procedure: FINE NEEDLE ASPIRATION (FNA) LINEAR;  Surgeon: Carol Ada, MD;  Location: [facility] ENDOSCOPY;  Service: Endoscopy;  Laterality: N/A;  . GASTROSTOMY TUBE PLACEMENT  10/2014  . IR GENERIC HISTORICAL  03/02/2016   IR FLUORO RM 30-60 MIN 03/02/2016 Corrie Mckusick, DO [facility]INTER[facility]  . IR GENERIC HISTORICAL  03/12/2016   IR INT EXT BILIARY DRAIN WITH CHOLANGIOGRAM 03/12/2016 Corrie Mckusick, DO [facility]  . IR GENERIC HISTORICAL  02/25/2016   IR RADIOLOGIST EVAL & MGMT 02/25/2016 Corrie Mckusick, DO [facility]  . IR GENERIC HISTORICAL  03/19/2016   IR EXCHANGE BILIARY DRAIN 03/19/2016 Greggory Keen, MD [facility]  .  KNEE ARTHROSCOPY  12/21/2011   Procedure: ARTHROSCOPY KNEE;  Surgeon: Tobi Bastos, MD;  Location: [facility];  Service: Orthopedics;  Laterality: Right;  WITH MEDIAL MENISECTOMY  . LARYNGOSCOPY  01/2015 and 04/21/15  . ORIF LEFT RING FINGER  AND REVASCULARIZATION OF RADIAL SIDE  02-16-2007   DEGLOVING INJURY  . [place] cath    . PORTACATH PLACEMENT  Removed 06/20/3015   as of 05-27-15 remains right chest. 02-16-16 Port has been removed now.  Marland Kitchen ROTATOR CUFF REPAIR  2008   LEFT SHOULDER  . SHOULDER OPEN ROTATOR CUFF REPAIR Right 06/05/2014   Procedure: RIGHT ROTATOR CUFF REPAIR SHOULDER OPEN;  Surgeon: Latanya Maudlin, MD;  Location: [facility] ORS;  Service: Orthopedics;  Laterality: Right;  . TONSILLECTOMY     as child       Home Medications    Prior to Admission medications   Medication Sig Start Date End Date Taking? Authorizing Provider  amoxicillin-clavulanate (AUGMENTIN) 875-125 MG tablet Take 1 tablet by mouth 2 (two) times daily. 03/21/16 03/27/16 Yes Janece Canterbury, MD  aspirin 325 MG tablet Place 650 mg into feeding tube every 6 (six) hours as needed for mild pain.  Yes Historical Provider, MD  b complex vitamins tablet Place 1 tablet into feeding tube daily.    Yes Historical Provider, MD  dicyclomine (BENTYL) 20 MG tablet Place 20 mg into feeding tube every 6 (six) hours as needed (for abdominal cramps).    Yes Historical Provider, MD  diphenhydramine-acetaminophen (TYLENOL PM) 25-500 MG TABS tablet Place 1 tablet into feeding tube at bedtime as needed (for sleep).    Yes Historical Provider, MD  diphenoxylate-atropine (LOMOTIL) 2.5-0.025 MG tablet Place 2 tablets into feeding tube 4 (four) times daily as needed for diarrhea or loose stools.   Yes Historical Provider, MD  hydrOXYzine (ATARAX/VISTARIL) 25 MG tablet Place 25 mg into feeding tube 3 (three) times daily as needed for itching.    Yes Historical Provider, MD  LORazepam (ATIVAN) 1 MG tablet Take 0.5-1 mg by  mouth every 4 (four) hours as needed for anxiety.   Yes Historical Provider, MD  metoCLOPramide (REGLAN) 5 MG/5ML solution Place 5 mLs (5 mg total) into feeding tube every 6 (six) hours as needed for nausea or vomiting (abdominal distension). 03/21/16  Yes Janece Canterbury, MD  midodrine (PROAMATINE) 5 MG tablet Place 5 mg into feeding tube 3 (three) times daily with meals.    Yes Historical Provider, MD  Multiple Vitamin (MULTIVITAMIN WITH MINERALS) TABS tablet Place 1 tablet into feeding tube daily.    Yes Historical Provider, MD  mupirocin ointment (BACTROBAN) 2 % Apply 1 application topically 2 (two) times daily.    Yes Historical Provider, MD  Nutritional Supplements (FEEDING SUPPLEMENT, OSMOLITE 1.5 CAL,) LIQD Place 237 mLs into feeding tube 3 (three) times daily.    Yes Historical Provider, MD  Nutritional Supplements (TWOCAL HN) LIQD Place 237 mLs into feeding tube 3 (three) times daily.   Yes Historical Provider, MD  omeprazole (PRILOSEC) 40 MG capsule 40 mg daily as needed (per tube for acid reflux).    Yes Historical Provider, MD  ondansetron (ZOFRAN) 8 MG tablet Place 8 mg into feeding tube every 8 (eight) hours as needed for nausea or vomiting.    Yes Historical Provider, MD  oxyCODONE (ROXICODONE) 5 MG/5ML solution Take 10 mLs (10 mg total) by mouth every 4 (four) hours as needed for moderate pain or severe pain. 03/21/16  Yes Janece Canterbury, MD  promethazine (PHENERGAN) 25 MG tablet Place 25 mg into feeding tube every 6 (six) hours as needed for nausea or vomiting.    Yes Historical Provider, MD  senna (SENOKOT) 8.6 MG tablet Place 1 tablet into feeding tube daily as needed for constipation.    Yes Historical Provider, MD  simethicone (MYLICON) 40 JA/2.5KN drops Place 1.2 mLs (80 mg total) into feeding tube 4 (four) times daily. 03/21/16  Yes Janece Canterbury, MD  triamcinolone ointment (KENALOG) 0.1 % Apply 1 application topically 2 (two) times daily. Pt applies to both feet.   Yes  Historical Provider, MD  vitamin C (ASCORBIC ACID) 500 MG tablet Place 500 mg into feeding tube daily.    Yes Historical Provider, MD    Family History Family History  Problem Relation Age of Onset  . Congestive Heart Failure Mother   . Cancer Sister     colon cancer  . Cancer Maternal Grandfather   . Colon cancer Father 38  . Lung cancer Father   . Heart attack Brother   . Colon cancer    . Heart attack    . Cancer    . Hypertension    . Hyperlipidemia  Social History Social History  Substance Use Topics  . Smoking status: Former Smoker    Packs/day: 2.00    Years: 20.00    Types: Cigarettes    Quit date: 03/17/1981  . Smokeless tobacco: Never Used  . Alcohol use 4.8 oz/week    1 Cans of beer, 7 Standard drinks or equivalent per week     Comment: rare     Allergies   Augmentin [amoxicillin-pot clavulanate]; Fentanyl; Scopolamine; Zolpidem tartrate; Morphine and related; Carboplatin; and Levaquin [levofloxacin]   Review of Systems Review of Systems  Respiratory: Positive for shortness of breath.   All other systems reviewed and are negative.    Physical Exam Updated Vital Signs BP 120/72 (BP Location: Right Arm)   Pulse 109   Temp 97.7 F (36.5 C) (Oral)   Resp 17   Ht '5\' 7"'$  (1.702 m)   Wt 137 lb (62.1 kg)   SpO2 (!) 87%   BMI 21.46 kg/m   Physical Exam  Nursing note and vitals reviewed.  70 year old male, In mild to moderate respiratory distress. Vital signs are significant for tachycardia. Oxygen saturation is 87%, which is hypoxic. He is noted to be using accessory muscles of respiration and appears to be tiring. Head is normocephalic and atraumatic. PERRLA, EOMI. sclerae are anicteric. Oropharynx is clear. Neck is nontender and supple without adenopathy or JVD. Back is nontender and there is no CVA tenderness. Lungs have coarse expiratory wheezes. No rales are appreciated. Chest is nontender. Heart has regular rate and rhythm without  murmur. Abdomen is soft, flat, nontender without masses or hepatosplenomegaly and peristalsis is normoactive. Feeding tube is present in the epigastric area, biliary drain is present in the right upper quadrant. Extremities have no cyanosis or edema, full range of motion is present. Skin is warm and dry without rash. Neurologic: He is awake and alert and able to answer questions, cranial nerves are intact, there are no motor or sensory deficits.  ED Treatments / Results  Labs (all labs ordered are listed, but only abnormal results are displayed) Labs Reviewed  CBC WITH DIFFERENTIAL/PLATELET - Abnormal; Notable for the following:       Result Value   WBC 16.9 (*)    RBC 2.94 (*)    Hemoglobin 10.6 (*)    HCT 30.1 (*)    MCV 102.4 (*)    MCH 36.1 (*)    Neutro Abs 14.9 (*)    All other components within normal limits  COMPREHENSIVE METABOLIC PANEL - Abnormal; Notable for the following:    Chloride 113 (*)    CO2 20 (*)    Glucose, Bld 142 (*)    BUN 32 (*)    Creatinine, Ser 1.27 (*)    Calcium 8.7 (*)    Total Protein 6.0 (*)    Albumin 2.6 (*)    AST 127 (*)    ALT 120 (*)    Alkaline Phosphatase 373 (*)    Total Bilirubin 13.8 (*)    GFR calc non Af Amer 55 (*)    All other components within normal limits  PROTIME-INR - Abnormal; Notable for the following:    Prothrombin Time 26.7 (*)    All other components within normal limits  BRAIN NATRIURETIC PEPTIDE - Abnormal; Notable for the following:    B Natriuretic Peptide 298.3 (*)    All other components within normal limits  TROPONIN I - Abnormal; Notable for the following:    Troponin I 0.04 (*)  All other components within normal limits  URINALYSIS, ROUTINE W REFLEX MICROSCOPIC - Abnormal; Notable for the following:    Color, Urine AMBER (*)    Bilirubin Urine SMALL (*)    Protein, ur 30 (*)    All other components within normal limits  I-STAT CG4 LACTIC ACID, ED - Abnormal; Notable for the following:    Lactic  Acid, Venous 3.72 (*)    All other components within normal limits  CULTURE, BLOOD (ROUTINE X 2)  CULTURE, BLOOD (ROUTINE X 2)  URINE CULTURE    EKG  EKG Interpretation  Date/Time:  Monday March 22 2016 05:11:28 EST Ventricular Rate:  102 PR Interval:    QRS Duration: 77 QT Interval:  312 QTC Calculation: 407 R Axis:   57 Text Interpretation:  Sinus tachycardia Atrial premature complex Probable left atrial enlargement Low voltage, extremity and precordial leads When compared with ECG of 10/26/2015, No significant change was found Confirmed by [facility]  MD, Shalicia Craghead ([id number]) on 03/22/2016 5:43:30 AM       Radiology US Abdomen Limited  Result Date: 03/21/2016 CLINICAL DATA:  Elevated liver function tests. EXAM: LIMITED ABDOMEN ULTRASOUND FOR ASCITES TECHNIQUE: Limited ultrasound survey for ascites was performed in all four abdominal quadrants. COMPARISON:  CT of 03/10/2016 FINDINGS: Trace ascites identified, primarily in the right lower quadrant. IMPRESSION: Trace ascites, insufficient for paracentesis. Electronically Signed   By: Abigail Miyamoto M.D.   On: 03/21/2016 16:06   Dg Chest Port 1 View  Result Date: 03/22/2016 CLINICAL DATA:  Shortness of breath.  Awoke with cough. EXAM: PORTABLE CHEST 1 VIEW COMPARISON:  Radiographs 03/19/2016 FINDINGS: New from prior exam patchy and ground-glass opacities in the left upper lung zone and right perihilar lung. There is probable pleural thickening or fluid laterally in the lower right lung. Chronic changes in the right paramediastinal region are again seen. No evidence of pneumothorax. Normal heart size. Stable osseous structures. IMPRESSION: Patchy and ground-glass opacities in the right perihilar lung and left upper lobe, may be pneumonia or aspiration, new from exam 3 days prior. Electronically Signed   By: Jeb Levering M.D.   On: 03/22/2016 05:55   Dg Abd Portable 1v  Result Date: 03/21/2016 CLINICAL DATA:  Abdominal distention. EXAM:  PORTABLE ABDOMEN - 1 VIEW COMPARISON:  CT 03/10/2016 FINDINGS: Percutaneous gastrostomy tube over the stomach in the left upper quadrant. Biliary stent over the right upper quadrant unchanged. Interval placement of pigtail drainage catheter over the right upper quadrant which pigtail adjacent the distal end of the biliary stent. Bowel gas pattern demonstrates several air-filled nondilated large and small bowel loops. No free peritoneal air. There are degenerative changes of the spine and hips. IMPRESSION: Nonobstructive bowel gas pattern. Tubes and lines as described. Electronically Signed   By: Marin Olp M.D.   On: 03/21/2016 11:33   US Abdomen Limited Ruq  Result Date: 03/21/2016 CLINICAL DATA:  Elevated LFTs/bilirubin EXAM: US ABDOMEN LIMITED - RIGHT UPPER QUADRANT COMPARISON:  CT abdomen/ pelvis dated 03/10/2016 FINDINGS: Gallbladder: Gallbladder is poorly visualized with suspected gallstones and dirty shadowing related to gas within the gallbladder lumen. This is better visualized on CT. Common bile duct: Diameter: 4 mm Liver: Coarse hepatic echotexture.  Pneumobilia. IMPRESSION: Pneumobilia with gas within the gallbladder lumen, likely related to the patient's known indwelling bile duct stent. Cholelithiasis. No gallbladder distention or additional findings specific for acute cholecystitis. Electronically Signed   By: Julian Hy M.D.   On: 03/21/2016 14:56    Procedures Procedures (  including critical care time) CRITICAL CARE Performed by: GNFAO,[email or web address] Total critical care time: 50 minutes Critical care time was exclusive of separately billable procedures and treating other patients. Critical care was necessary to treat or prevent imminent or life-threatening deterioration. Critical care was time spent personally by me on the following activities: development of treatment plan with patient and/or surrogate as well as nursing, discussions with consultants, evaluation of patient's response  to treatment, examination of patient, obtaining history from patient or surrogate, ordering and performing treatments and interventions, ordering and review of laboratory studies, ordering and review of radiographic studies, pulse oximetry and re-evaluation of patient's condition.  Medications Ordered in ED Medications  ipratropium-albuterol (DUONEB) 0.5-2.5 (3) MG/3ML nebulizer solution 3 mL (3 mLs Nebulization Given 03/22/16 0529)     Initial Impression / Assessment and Plan / ED Course  I have reviewed the triage vital signs and the nursing notes.  Pertinent labs & imaging results that were available during my care of the patient were reviewed by me and considered in my medical decision making (see chart for details).  Clinical Course    Respiratory distress. Other vital signs are normal and he is afebrile. However, lactic acid level has come back elevated so they started on sepsis protocol. Chest x-ray shows pneumonia which is probably aspiration. Old records are reviewed confirming discharge in the hospital yesterday following management of possible infected biliary drain. Since he seemed to be tiring, he was placed on BiPAP with significant improvement. He was obtaining adequate oxygenation and felt more comfortable. He is started on antibiotics for healthcare associated pneumonia. Of note, he has listed allergy to Augmentin, but this is diarrhea which is a side effect and not in allergy. He is able to take cephalosporins without difficulty.  He continued to be comfortable on BiPAP. Laboratory workup shows worsening hepatic failure. Case is discussed with Dr. Sheran Fava of [facility] hospitalists who agrees to admit the patient.  Final Clinical Impressions(s) / ED Diagnoses   Final diagnoses:  HCAP (healthcare-associated pneumonia)  Subacute liver failure without hepatic coma  Elevated lactic acid level  Macrocytic anemia    New Prescriptions New Prescriptions   No medications on file       Delora Fuel, MD [id number]

## 2016-03-22 NOTE — Progress Notes (Signed)
Pharmacy Antibiotic Note  [name] is a 70 y.o. male admitted on 03/22/2016 with pneumonia.  Pharmacy has been consulted for Vancomycin, cefepime  dosing.  Plan: Vancomycin '500mg'$  IV every 12 hours.  Goal trough 15-20 mcg/mL.  Cefepime 2gm iv q12hr  Height: '5\' 7"'$  (170.2 cm) Weight: 137 lb (62.1 kg) IBW/kg (Calculated) : 66.1  Temp (24hrs), Avg:98.2 F (36.8 C), Min:97.7 F (36.5 C), Max:98.6 F (37 C)   Recent Labs Lab 03/16/16 1114 03/19/16 1232 03/19/16 1816 03/20/16 0440 03/21/16 0422 03/22/16 0511 03/22/16 0521  WBC 7.2 12.6*  --  6.8 6.3 16.9*  --   CREATININE 1.1 1.5*  --  1.36* 1.06  --   --   LATICACIDVEN  --   --  1.80  --   --   --  3.72*    Estimated Creatinine Clearance: 56.1 mL/min (by C-G formula based on SCr of 1.06 mg/dL).    Allergies  Allergen Reactions  . Augmentin [Amoxicillin-Pot Clavulanate] Diarrhea and Other (See Comments)    .Has patient had a PCN reaction causing immediate rash, facial/tongue/throat swelling, SOB or lightheadedness with hypotension: No Has patient had a PCN reaction causing severe rash involving mucus membranes or skin necrosis: No Has patient had a PCN reaction that required hospitalization No Has patient had a PCN reaction occurring within the last 10 years: Yes If all of the above answers are "NO", then may proceed with Cephalosporin use.   . Fentanyl Other (See Comments)    Reaction:  Hallucinations   . Scopolamine Other (See Comments)    Reaction:  Hallucinations   . Zolpidem Tartrate Diarrhea  . Morphine And Related Itching  . Carboplatin Itching and Rash  . Levaquin [Levofloxacin] Other (See Comments)    Reaction:  Knee pain     Antimicrobials this admission: Vancomycin 03/22/2016 >> Cefepime 03/22/2016 >>   Dose adjustments this admission: -  Microbiology results: pending  Thank you for allowing pharmacy to be a part of this patient's care.  [name] 03/22/2016 5:53 AM

## 2016-03-22 NOTE — ED Notes (Signed)
Radio broadcast assistant to give 20 min timer.  Was told that not able to start timer due to no room available at this time.

## 2016-03-22 NOTE — ED Triage Notes (Signed)
Patient wife states patient is a cancer patient that woke up coughing. Wife states that husband was coughing up rusty brown particles. Patients wife think that patient has aspirated. Patient is having a continous cough. O2 Sats 74% on 4 Litters of Oxygen. Patient taken to recess, md called to room, and respiratory called to bedside.

## 2016-03-22 NOTE — Progress Notes (Signed)
Pt remains on BIPAP/NIV at this time.

## 2016-03-23 ENCOUNTER — Inpatient Hospital Stay (HOSPITAL_COMMUNITY): Payer: Medicare Other

## 2016-03-23 DIAGNOSIS — F411 Generalized anxiety disorder: Secondary | ICD-10-CM

## 2016-03-23 DIAGNOSIS — C3490 Malignant neoplasm of unspecified part of unspecified bronchus or lung: Secondary | ICD-10-CM

## 2016-03-23 DIAGNOSIS — Z7189 Other specified counseling: Secondary | ICD-10-CM

## 2016-03-23 DIAGNOSIS — R0602 Shortness of breath: Secondary | ICD-10-CM

## 2016-03-23 DIAGNOSIS — C7989 Secondary malignant neoplasm of other specified sites: Secondary | ICD-10-CM

## 2016-03-23 DIAGNOSIS — Z515 Encounter for palliative care: Secondary | ICD-10-CM

## 2016-03-23 LAB — CBC
HCT: 28.3 % — ABNORMAL LOW (ref 39.0–52.0)
Hemoglobin: 9.8 g/dL — ABNORMAL LOW (ref 13.0–17.0)
MCH: 35.8 pg — AB (ref 26.0–34.0)
MCHC: 34.6 g/dL (ref 30.0–36.0)
MCV: 103.3 fL — ABNORMAL HIGH (ref 78.0–100.0)
Platelets: 142 10*3/uL — ABNORMAL LOW (ref 150–400)
RBC: 2.74 MIL/uL — AB (ref 4.22–5.81)
RDW: 15.2 % (ref 11.5–15.5)
WBC: 15.5 10*3/uL — ABNORMAL HIGH (ref 4.0–10.5)

## 2016-03-23 LAB — COMPREHENSIVE METABOLIC PANEL
ALK PHOS: 291 U/L — AB (ref 38–126)
ALT: 116 U/L — AB (ref 17–63)
AST: 112 U/L — ABNORMAL HIGH (ref 15–41)
Albumin: 2.4 g/dL — ABNORMAL LOW (ref 3.5–5.0)
Anion gap: 9 (ref 5–15)
BUN: 32 mg/dL — ABNORMAL HIGH (ref 6–20)
CALCIUM: 8.3 mg/dL — AB (ref 8.9–10.3)
CO2: 17 mmol/L — AB (ref 22–32)
CREATININE: 1.11 mg/dL (ref 0.61–1.24)
Chloride: 120 mmol/L — ABNORMAL HIGH (ref 101–111)
Glucose, Bld: 173 mg/dL — ABNORMAL HIGH (ref 65–99)
Potassium: 3.5 mmol/L (ref 3.5–5.1)
Sodium: 146 mmol/L — ABNORMAL HIGH (ref 135–145)
Total Bilirubin: 11.4 mg/dL — ABNORMAL HIGH (ref 0.3–1.2)
Total Protein: 5.4 g/dL — ABNORMAL LOW (ref 6.5–8.1)

## 2016-03-23 LAB — URINE CULTURE: Culture: NO GROWTH

## 2016-03-23 LAB — PROTIME-INR
INR: 2.58
PROTHROMBIN TIME: 28.2 s — AB (ref 11.4–15.2)

## 2016-03-23 MED ORDER — METOCLOPRAMIDE HCL 5 MG/ML IJ SOLN
10.0000 mg | Freq: Four times a day (QID) | INTRAMUSCULAR | Status: DC
  Administered 2016-03-23 – 2016-03-24 (×5): 10 mg via INTRAVENOUS
  Filled 2016-03-23 (×5): qty 2

## 2016-03-23 MED ORDER — DEXTROSE-NACL 5-0.45 % IV SOLN
INTRAVENOUS | Status: DC
  Administered 2016-03-23: 05:00:00 via INTRAVENOUS

## 2016-03-23 MED ORDER — MORPHINE SULFATE (PF) 2 MG/ML IV SOLN: 0.5000 mg | INTRAVENOUS | Status: DC | PRN

## 2016-03-23 MED ORDER — OSMOLITE 1.5 CAL PO LIQD
1000.0000 mL | ORAL | Status: DC
  Administered 2016-03-23: 1000 mL
  Filled 2016-03-23 (×2): qty 1000

## 2016-03-23 MED ORDER — METOCLOPRAMIDE HCL 5 MG/5ML PO SOLN
10.0000 mg | Freq: Three times a day (TID) | ORAL | Status: DC
  Administered 2016-03-23: 10 mg via ORAL
  Filled 2016-03-23: qty 10

## 2016-03-23 MED ORDER — LORAZEPAM 2 MG/ML IJ SOLN
1.0000 mg | INTRAMUSCULAR | Status: DC | PRN
  Administered 2016-03-23 – 2016-03-24 (×4): 1 mg via INTRAVENOUS
  Filled 2016-03-23 (×5): qty 1

## 2016-03-23 MED ORDER — HYDROMORPHONE HCL 1 MG/ML IJ SOLN
1.0000 mg | INTRAMUSCULAR | Status: DC | PRN
  Administered 2016-03-23: 1 mg via INTRAVENOUS
  Filled 2016-03-23 (×2): qty 1

## 2016-03-23 MED ORDER — HYDROMORPHONE HCL 1 MG/ML IJ SOLN
1.0000 mg | INTRAMUSCULAR | Status: DC | PRN
  Administered 2016-03-23 – 2016-03-24 (×9): 1 mg via INTRAVENOUS
  Filled 2016-03-23 (×9): qty 1

## 2016-03-23 MED ORDER — POTASSIUM CL IN DEXTROSE 5% 20 MEQ/L IV SOLN
20.0000 meq | INTRAVENOUS | Status: DC
  Administered 2016-03-23: 20 meq via INTRAVENOUS
  Filled 2016-03-23: qty 1000

## 2016-03-23 MED ORDER — POTASSIUM CL IN DEXTROSE 5% 20 MEQ/L IV SOLN
20.0000 meq | INTRAVENOUS | Status: DC
  Administered 2016-03-24: 20 meq via INTRAVENOUS
  Filled 2016-03-23 (×3): qty 1000

## 2016-03-23 MED ORDER — HYDROMORPHONE HCL 1 MG/ML IJ SOLN
0.2500 mg | INTRAMUSCULAR | Status: DC | PRN
Start: 2016-03-23 — End: 2016-03-23
  Administered 2016-03-23 (×2): 0.5 mg via INTRAVENOUS
  Filled 2016-03-23 (×2): qty 0.5

## 2016-03-23 NOTE — Progress Notes (Signed)
PROGRESS NOTE  [name]  FYB:[id number] DOB: Mar 14, 1946 DOA: 03/22/2016 PCP: [name], MD  Brief Narrative:   [name] is a 70 y.o. 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  Despite a common bile duct drain and a percutaneous biliary drain he has had rising liver function tests and bilirubin over the last 2 months.  He is also have problems with chronic diarrhea and abdominal distention resulting in some mild dyspnea. He was hospitalized from 12/22-12/24 due to sepsis from probable cholangitis and discharged home with home health services with anticipation of setting up hospice after the holiday. He was DO NOT RESUSCITATE/DO NOT INTUBATE.  He was doing well after he returned home but woke up suddenly in the middle the night [name] of breath with a wet cough.  He had been running tube feeds at the time.  CXR confirmed aspiration pneumonia and he was admitted to stepdown on bipap and broad sprectrum antibiotics.  After readmission, blood culture from his previous admission grew Enterobacter.  Repeat blood cultures from this admission are pending.  He has required continuous bipap and his CXR is looking increasingly like ARDS.  His prognosis is guarded.  Palliative care has been consulted to help with symptom management but I suspect he will have an in-hospital death unless he turns around on bipap.    Assessment & Plan:   Principal Problem:   Aspiration pneumonia ([place]) Active Problems:   Bronchogenic cancer of left lung (HCC)   Biliary obstruction   Sepsis ([place])   Gram-negative bacteremia   Diarrhea   HCAP (healthcare-associated pneumonia)   Acute respiratory failure with hypoxia (HCC)  Acute respiratory failure with hypoxia and sepsis (tachycardic, tachypnea, leukocytosis, elevated lactic acid) due to Aspiration pneumonia as suggested by patient history.  Persistent tachypnea and respiratory distress despite  bipap.   -  Cont zosyn -  F/u blood cultures from 12/25  -  F/u speciation and sensitivities from blood culture from 12/22 -  Aspiration precautions -  Continue bipap with HFNC for breaks   Enterobacter bacteremia and bile duct infection/cholangitis dx from previous admission -  abx as above -  Repeat blood cultures so far NGTD  Biliary obstruction resulting from complications of non-small cell lung cancer, metastatic, IR and GI (spoke with Dr. Benson Norway) have nothing further to offer at this time.  Spoke with Dr. Annamaria Boots during previous admission and per family request, I will speak again with Interventional Radiology to make sure new drain is in good position.   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  Fibrinogen normal.   -  Vitamin K '5mg'$  per tube once on 12/15  Non-small cell lung cancer, metastatic  -  Dr. 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    Hypernatremia, iatrogenic -  Change to D5W  fluids  DVT prophylaxis: lovenox  Code Status: DNR Family Communication: patient and wife  Disposition Plan:  Unclear at this point due to severity of underlying illness.  Suspect in hospital death unless he shows signs of improvement in the next 24-48 hours.       Consultants:   Oncology  Palliative  care  Procedures:  none  Antimicrobials:  Anti-infectives    Start     Dose/Rate Route Frequency Ordered Stop   03/22/16 2200  vancomycin (VANCOCIN) 500 mg in sodium chloride 0.9 % 100 mL IVPB     500 mg 100 mL/hr over 60 Minutes Intravenous Every 12 hours 03/22/16 0551     03/22/16 1800  ceFEPIme (MAXIPIME) 2 g in dextrose 5 % 50 mL IVPB  Status:  Discontinued     2 g 100 mL/hr over 30 Minutes Intravenous Every 12 hours 03/22/16 0551 03/22/16 0839   03/22/16 1400  piperacillin-tazobactam (ZOSYN) IVPB 3.375 g     3.375 g 12.5 mL/hr over 240 Minutes Intravenous Every 8 hours 03/22/16 0845     03/22/16 0545  ceFEPIme (MAXIPIME) 2 g in dextrose 5 % 50 mL IVPB     2 g 100 mL/hr over 30 Minutes Intravenous  Once 03/22/16 0540 03/22/16 0636   03/22/16 0545  vancomycin (VANCOCIN) IVPB 1000 mg/200 mL premix     1,000 mg 200 mL/hr over 60 Minutes Intravenous  Once 03/22/16 0540 03/22/16 0759       Subjective:  Feeling a little better on bipap early this morning, but then developed nausea that resulted in needing to change to high flow nasal canula.  Since transition, he has been increasingly [name] of breath.    Objective: Vitals:   03/23/16 0600 03/23/16 0700 03/23/16 1000 03/23/16 1200  BP: 125/73 110/76 (!) 153/99   Pulse:  78 82   Resp:  (!) 36 (!) 37   Temp:  98.1 F (36.7 C)  98.2 F (36.8 C)  TempSrc:      SpO2:  95% 97%   Weight:      Height:        Intake/Output Summary (Last 24 hours) at 03/23/16 1218 Last data filed at 03/23/16 0600  Gross per 24 hour  Intake          2636.58 ml  Output              975 ml  Net          1661.58 ml   Filed Weights   03/22/16 0508 03/22/16 1532  Weight: 62.1 kg (137 lb) 66.4 kg (146 lb 6.2 oz)    Examination:  General exam:  Adult male, jaundiced and cachectic appearing, mild to moderate respiratory distress and tachypnea on high flow nasal cannula  HEENT:  NCAT, MMM Respiratory system: Rhonchorous with rales and  diminished bilaterally, particularly at the bases, no wheezing Cardiovascular system: Regular rate and rhythm, normal S1/S2. No murmurs, rubs, gallops or clicks.  Warm extremities Gastrointestinal system: Normal active bowel sounds, soft, moderately distended, minimally TTP in the RUQ without rebound or guarding MSK:  Normal tone and bulk, no lower extremity edema Neuro:  Grossly moves all extremities    Data Reviewed: I have personally reviewed following labs and imaging studies  CBC:  Recent Labs Lab 03/19/16 1232 03/20/16 0440 03/21/16 0422 03/22/16 0511 03/23/16 0307  WBC 12.6* 6.8 6.3 16.9* 15.5*  NEUTROABS 10.2*  --   --  14.9*  --   HGB 11.0* 9.1* 9.4*  10.6* 9.8*  HCT 31.4* 26.0* 27.2* 30.1* 28.3*  MCV 101.9* 101.6* 101.9* 102.4* 103.3*  PLT 149 113* 117* 152 626*   Basic Metabolic Panel:  Recent Labs Lab 03/19/16 1232 03/20/16 0440 03/21/16 0422 03/22/16 0511 03/23/16 0307  NA 135* 141 140 143 146*  K 4.3 3.9 4.2 4.2 3.5  CL  --  116* 113* 113* 120*  CO2 20* 18* 19* 20* 17*  GLUCOSE 100 143* 154* 142* 173*  BUN 50.1* 42* 31* 32* 32*  CREATININE 1.5* 1.36* 1.06 1.27* 1.11  CALCIUM 9.1 8.1* 8.1* 8.7* 8.3*   GFR: Estimated Creatinine Clearance: 57.1 mL/min (by C-G formula based on SCr of 1.11 mg/dL). Liver Function Tests:  Recent Labs Lab 03/19/16 1232 03/20/16 0440 03/21/16 0422 03/22/16 0511 03/23/16 0307  AST 97* 93* 118* 127* 112*  ALT 100* 83* 100* 120* 116*  ALKPHOS 266* 236* 328* 373* 291*  BILITOT 9.77* 10.1* 10.2* 13.8* 11.4*  PROT 6.4 5.0* 5.2* 6.0* 5.4*  ALBUMIN 2.7* 2.5* 2.5* 2.6* 2.4*    Recent Labs Lab 03/19/16 1805  LIPASE 13    Recent Labs Lab 03/21/16 0917  AMMONIA <9*   Coagulation Profile:  Recent Labs Lab 03/22/16 0511 03/23/16 0307  INR 2.41 2.58   Cardiac Enzymes:  Recent Labs Lab 03/22/16 0511  TROPONINI 0.04*   BNP (last 3 results) No results for input(s): PROBNP in the last 8760 hours. HbA1C: No  results for input(s): HGBA1C in the last 72 hours. CBG: No results for input(s): GLUCAP in the last 168 hours. Lipid Profile: No results for input(s): CHOL, HDL, LDLCALC, TRIG, CHOLHDL, LDLDIRECT in the last 72 hours. Thyroid Function Tests: No results for input(s): TSH, T4TOTAL, FREET4, T3FREE, THYROIDAB in the last 72 hours. Anemia Panel: No results for input(s): VITAMINB12, FOLATE, FERRITIN, TIBC, IRON, RETICCTPCT in the last 72 hours. Urine analysis:    Component Value Date/Time   COLORURINE AMBER (A) 03/22/2016 0707   APPEARANCEUR CLEAR 03/22/2016 0707   LABSPEC 1.012 03/22/2016 0707   PHURINE 6.0 03/22/2016 0707   GLUCOSEU NEGATIVE 03/22/2016 0707   HGBUR NEGATIVE 03/22/2016 0707   BILIRUBINUR SMALL (A) 03/22/2016 0707   KETONESUR NEGATIVE 03/22/2016 0707   PROTEINUR 30 (A) 03/22/2016 0707   UROBILINOGEN 0.2 01/28/2015 1000   NITRITE NEGATIVE 03/22/2016 0707   LEUKOCYTESUR NEGATIVE 03/22/2016 0707   Sepsis Labs: '@LABRCNTIP'$ (procalcitonin:4,lacticidven:4)  ) Recent Results (from the past 240 hour(s))  Culture, Blood     Status: None (Preliminary result)   Collection Time: 03/19/16 12:33 PM  Result Value Ref Range Status   BLOOD CULTURE, ROUTINE Preliminary report  Preliminary   RESULT 1 Comment  Preliminary    Comment: No growth in 36 - 48 hours.  Culture, blood (single) w Reflex to ID Panel     Status: Abnormal (Preliminary result)   Collection Time: 03/19/16 12:38 PM  Result Value Ref Range Status   BLOOD CULTURE, ROUTINE Preliminary report (A)  Preliminary   RESULT 1 Comment (A)  Preliminary    Comment: Enterobacter cloacae complex Recovered from anaerobic bottle only.    RESULT 2 Comment  Preliminary    Comment: No growth in 36 - 48 hours. Recovered from aerobic bottle only.   Urine culture     Status: None   Collection Time: 03/19/16  5:47 PM  Result Value Ref Range Status   Specimen Description URINE, RANDOM  Final   Special Requests NONE  Final    Culture NO GROWTH Performed at [facility]  Final   Report Status 03/21/2016 FINAL  Final  Gastrointestinal Panel by PCR , Stool     Status: None   Collection Time: 03/19/16  6:12 PM  Result Value Ref Range Status   Campylobacter species NOT DETECTED NOT DETECTED Final   Plesimonas shigelloides NOT DETECTED NOT DETECTED Final   Salmonella species NOT DETECTED NOT DETECTED Final   Yersinia enterocolitica NOT DETECTED NOT DETECTED Final   Vibrio species NOT DETECTED NOT DETECTED Final   Vibrio cholerae NOT DETECTED NOT DETECTED Final   Enteroaggregative E coli (EAEC) NOT DETECTED NOT DETECTED Final   Enteropathogenic E coli (EPEC) NOT DETECTED NOT DETECTED Final   Enterotoxigenic E coli (ETEC) NOT DETECTED NOT DETECTED Final   Shiga like toxin producing E coli (STEC) NOT DETECTED NOT DETECTED Final   Shigella/Enteroinvasive E coli (EIEC) NOT DETECTED NOT DETECTED Final   Cryptosporidium NOT DETECTED NOT DETECTED Final   Cyclospora cayetanensis NOT DETECTED NOT DETECTED Final   Entamoeba histolytica NOT DETECTED NOT DETECTED Final   Giardia lamblia NOT DETECTED NOT DETECTED Final   Adenovirus F40/41 NOT DETECTED NOT DETECTED Final   Astrovirus NOT DETECTED NOT DETECTED Final   Norovirus GI/GII NOT DETECTED NOT DETECTED Final   Rotavirus A NOT DETECTED NOT DETECTED Final   Sapovirus (I, II, IV, and V) NOT DETECTED NOT DETECTED Final  Body fluid culture     Status: None   Collection Time: 03/20/16  1:50 PM  Result Value Ref Range Status   Specimen Description BILE  Final   Special Requests Normal  Final   Gram Stain   Final    NO WBC SEEN ABUNDANT GRAM NEGATIVE RODS MODERATE GRAM POSITIVE COCCI IN PAIRS RARE GRAM POSITIVE RODS Performed at [facility]    Culture MULTIPLE ORGANISMS PRESENT, NONE PREDOMINANT  Final   Report Status 03/22/2016 FINAL  Final  Blood Culture (routine x 2)     Status: None (Preliminary result)   Collection Time: 03/22/16  5:58 AM  Result  Value Ref Range Status   Specimen Description BLOOD LEFT ANTECUBITAL  Final   Special Requests IN PEDIATRIC BOTTLE 4 CC  Final   Culture   Final    NO GROWTH 1 DAY Performed at [facility]    Report Status PENDING  Incomplete  Blood Culture (routine x 2)     Status: None (Preliminary result)   Collection Time: 03/22/16  5:58 AM  Result Value Ref Range Status   Specimen Description BLOOD LEFT HAND  Final   Special Requests BOTTLES DRAWN AEROBIC AND ANAEROBIC 5 CC  Final   Culture   Final    NO GROWTH 1 DAY Performed at [facility]    Report Status PENDING  Incomplete  Urine culture     Status: None   Collection Time: 03/22/16  7:06 AM  Result Value Ref Range Status   Specimen Description URINE, CLEAN CATCH  Final   Special Requests NONE  Final   Culture NO GROWTH Performed at [facility]   Final   Report Status 03/23/2016 FINAL  Final  MRSA PCR Screening     Status: None   Collection Time: 03/22/16  3:22 PM  Result Value Ref Range Status   MRSA by PCR NEGATIVE NEGATIVE Final    Comment:        The GeneXpert MRSA Assay (FDA approved for NASAL specimens only), is one component of a comprehensive MRSA colonization surveillance program. It is not intended to diagnose  MRSA infection nor to guide or monitor treatment for MRSA infections.   Culture, sputum-assessment     Status: None   Collection Time: 03/22/16  5:11 PM  Result Value Ref Range Status   Specimen Description SPUTUM  Final   Special Requests Normal  Final   Sputum evaluation THIS SPECIMEN IS ACCEPTABLE FOR SPUTUM CULTURE  Final   Report Status 03/22/2016 FINAL  Final  Culture, respiratory (NON-Expectorated)     Status: None (Preliminary result)   Collection Time: 03/22/16  5:11 PM  Result Value Ref Range Status   Specimen Description SPUTUM  Final   Special Requests Normal Reflexed from [id number]  Final   Gram Stain   Final    FEW WBC PRESENT,BOTH PMN AND MONONUCLEAR MODERATE SQUAMOUS  EPITHELIAL CELLS PRESENT NO ORGANISMS SEEN Performed at [facility]    Culture PENDING  Incomplete   Report Status PENDING  Incomplete      Radiology Studies: Dg Chest Port 1 View  Result Date: 03/23/2016 CLINICAL DATA:  Shortness of Breath EXAM: PORTABLE CHEST 1 VIEW COMPARISON:  03/22/2016 FINDINGS: Cardiac shadow is stable. Aortic calcifications are again identified. Patchy ground-glass infiltrate is noted bilaterally worse on the left than the right. This is increased particularly in the left base when compared with the prior exam. Right upper quadrant biliary catheter is noted. IMPRESSION: Increasing ground-glass infiltrate particularly in the left lung base. Electronically Signed   By: Inez Catalina M.D.   On: 03/23/2016 08:29   Dg Chest Port 1 View  Result Date: 03/22/2016 CLINICAL DATA:  Shortness of breath.  Awoke with cough. EXAM: PORTABLE CHEST 1 VIEW COMPARISON:  Radiographs 03/19/2016 FINDINGS: New from prior exam patchy and ground-glass opacities in the left upper lung zone and right perihilar lung. There is probable pleural thickening or fluid laterally in the lower right lung. Chronic changes in the right paramediastinal region are again seen. No evidence of pneumothorax. Normal heart size. Stable osseous structures. IMPRESSION: Patchy and ground-glass opacities in the right perihilar lung and left upper lobe, may be pneumonia or aspiration, new from exam 3 days prior. Electronically Signed   By: Jeb Levering M.D.   On: 03/22/2016 05:55   US Abdomen Limited Ruq  Result Date: 03/21/2016 CLINICAL DATA:  Elevated LFTs/bilirubin EXAM: US ABDOMEN LIMITED - RIGHT UPPER QUADRANT COMPARISON:  CT abdomen/ pelvis dated 03/10/2016 FINDINGS: Gallbladder: Gallbladder is poorly visualized with suspected gallstones and dirty shadowing related to gas within the gallbladder lumen. This is better visualized on CT. Common bile duct: Diameter: 4 mm Liver: Coarse hepatic echotexture.   Pneumobilia. IMPRESSION: Pneumobilia with gas within the gallbladder lumen, likely related to the patient's known indwelling bile duct stent. Cholelithiasis. No gallbladder distention or additional findings specific for acute cholecystitis. Electronically Signed   By: Julian Hy M.D.   On: 03/21/2016 14:56     Scheduled Meds: . chlorhexidine  15 mL Mouth Rinse BID  . enoxaparin (LOVENOX) injection  40 mg Subcutaneous Q24H  . feeding supplement (OSMOLITE 1.5 CAL)  1,000 mL Per Tube Q24H  . mouth rinse  15 mL Mouth Rinse q12n4p  . methylPREDNISolone (SOLU-MEDROL) injection  40 mg Intravenous Daily  . metoCLOPramide (REGLAN) injection  10 mg Intravenous Q6H  . midodrine  5 mg Per Tube TID WC  . mupirocin ointment  1 application Topical BID  . piperacillin-tazobactam (ZOSYN)  IV  3.375 g Intravenous Q8H  . simethicone  80 mg Per Tube QID  . triamcinolone ointment  1 application  Topical BID  . vancomycin  500 mg Intravenous Q12H   Continuous Infusions: . dextrose 5 % with KCl 20 mEq / L 20 mEq (03/23/16 1015)     LOS: 1 day    Time spent: 30 min    Janece Canterbury, MD [facility] Pager [phone]  If 7PM-7AM, please contact night-coverage [email or web address] Password [facility] 03/23/2016, 12:18 PM

## 2016-03-23 NOTE — Consult Note (Signed)
Consultation Note Date: 03/23/2016   Patient Name: [name]  DOB: 10/04/1945  MRN: [id number]  Age / Sex: 70 y.o., male  PCP: Nathan Melter, MD Referring Physician: Janece Canterbury, MD  Reason for Consultation: Establishing goals of care  HPI/Patient Profile: 70 y.o. 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 Palliative medicine consulted for symptom management and GOC.   Clinical Assessment and Goals of Care: Met with patient's wife, [name]. They have been married for 30 years, relocated her from [place], [place]. Patient has been undergoing treatments for cancer for 7 years. [name] tells me they have been told quite often that patient was at end of life, however, patient has been very resilient and continued to live with good quality of life. Patient was under care of [facility] from November 2016 to January 2017, however, upon improving his strength and functional status he revoked hospice and restarted chemotherapy treatments. She Is aware that we may be in a different place now but she is committed to continuing antibiotic treatment and hopes he may improve over the next day or two. She does not want him to suffer, and does want to continue DNR status. We discussed what comfort care would look like and the possibility of transfer to residential hospice if she chooses that as an option. Discussed with [name] my concern that [name] is dying and distressed. Although he was done well in the past and has been able to overcome many obstacles, I am concerned that with the multiple insults (biliary infection, progressing lung ca, pneumonia) low nutritional status  (albumin 2.7, cachexia w/ tube feedings), and hepatic failure that this is an end of life situation. [name] is receptive to this. She wishes to speak with [name] and desires reconsult with PMT tomorrow.     Primary Decision Maker NEXT OF KIN- spouse- [name]    SUMMARY OF RECOMMENDATIONS  -Increase dilaudid to 84m q30 minutes prn SOB or resp distress -Increase lorazepam to 121mq4hrs prn anxiety -Continue current level of care -DNR -PMT will continue [place], will monitor patient's progression    Code Status/Advance Care Planning:  DNR    Symptom Management:   As above  Palliative Prophylaxis:   Delirium Protocol  Psycho-social/Spiritual:   Desire for further Chaplaincy support:no  Additional Recommendations: Education on Hospice  Prognosis:    Hours - Days   Discharge Planning: Anticipated Hospital Death  Primary Diagnoses: Present on Admission: . HCAP (healthcare-associated pneumonia) . Biliary obstruction . Bronchogenic cancer of left lung ([facility]. Diarrhea . Gram-negative bacteremia . Sepsis ([facility]. Aspiration pneumonia ([facility]  I have reviewed the medical record, interviewed the patient and family, and examined the patient. The following aspects are pertinent.  Past Medical History:  Diagnosis Date  . Abnormality of gait 03/12/2015  . Acute meniscal tear of knee RIGHT KNEE  .  Antineoplastic chemotherapy induced anemia 03/04/2016  . ARF (acute renal failure) ([place]) 03/2015   pre-renal patient was septic. is improved now.  . Arthritis RIGHT SHOULDER  . BPH (benign prostatic hypertrophy)   . Dizziness and giddiness 04/23/2014  . Gastrostomy tube dependent ([place])    Continous feedings per [name] tube"unable to swallow"- Infusion pump for Nutrition  . HOH (hard of hearing)    bilateral hearing aids  . Hypertension    no longer an issue. Now runs usually low- tekes med to increase blood pressure  . Immature cataract BILATERAL  . Non-small  cell lung cancer ([place]) DX SEPT 2010  W/ CHEMORADIATION AT THAT TIME -- NOW  W/ METS--  CURRENTLY ON MAINTENANCE CHEMO TX  EVERY 30 DAYS   ONCOLOGIST- [name]- lung cancer with metastasis.  . Orthostatic hypotension    B/P tends to run 110/70 or less -using med to raise B/P.  Marland Kitchen Pneumonia   . Radiation 01/08/09-01/21/09   Mediastinum 30 Gy x 12 fractions  . Radiation 08/19/14-09/04/14   Palliative RT Porta hepatic LN 30 Gy  . Sepsis ([place]) 03/2015  . Shortness of breath dyspnea    with exertion  . Swallowing impairment    "Unable to swallow"  . Transfusion history    last 1-2 yrs ago with chemotherapy  . Tremor 04/23/2014   Right hand   Social History   Social History  . Marital status: Married    Spouse name: N/A  . Number of children: 3  . Years of education: college   Occupational History  . 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 Smoking status: Former Smoker    Packs/day: 2.00    Years: 20.00    Types: Cigarettes    Quit date: 03/17/1981  . Smokeless tobacco: Never Used  . Alcohol use 4.8 oz/week    1 Cans of beer, 7 Standard drinks or equivalent per week     Comment: rare  . Drug use: No  . Sexual activity: Not Currently   Other Topics Concern  . None   Social History Narrative   Lives with his wife, [name]   Patient is right handed.   Patient drinks 1-2 cups caffeine daily.   Family History  Problem Relation Age of Onset  . Congestive Heart Failure Mother   . Cancer Sister     colon cancer  . Cancer Maternal Grandfather   . Colon cancer Father 83  . Lung cancer Father   . Heart attack Brother   . Colon cancer    . Heart attack    . Cancer    . Hypertension    . Hyperlipidemia     Scheduled Meds: . chlorhexidine  15 mL Mouth Rinse BID  . enoxaparin (LOVENOX) injection  40 mg Subcutaneous Q24H  . feeding supplement (OSMOLITE 1.5 CAL)  1,000 mL Per Tube Q24H  . mouth rinse  15 mL Mouth Rinse q12n4p  .  methylPREDNISolone (SOLU-MEDROL) injection  40 mg Intravenous Daily  . metoCLOPramide (REGLAN) injection  10 mg Intravenous Q6H  . midodrine  5 mg Per Tube TID WC  . mupirocin ointment  1 application Topical BID  . piperacillin-tazobactam (ZOSYN)  IV  3.375 g Intravenous Q8H  . simethicone  80 mg Per Tube QID  . triamcinolone ointment  1 application Topical BID  . vancomycin  500 mg Intravenous Q12H   Continuous Infusions: . dextrose 5 % with KCl 20  mEq / L 20 mEq (03/23/16 1015)   PRN Meds:.dicyclomine, diphenhydrAMINE, diphenoxylate-atropine, HYDROmorphone (DILAUDID) injection, hydrOXYzine, LORazepam Medications Prior to Admission:  Prior to Admission medications   Medication Sig Start Date End Date Taking? Authorizing Provider  amoxicillin-clavulanate (AUGMENTIN) 875-125 MG tablet Take 1 tablet by mouth 2 (two) times daily. 03/21/16 03/27/16 Yes Nathan Canterbury, MD  aspirin 325 MG tablet Place 650 mg into feeding tube every 6 (six) hours as needed for mild pain.    Yes Historical Provider, MD  b complex vitamins tablet Place 1 tablet into feeding tube daily.    Yes Historical Provider, MD  dicyclomine (BENTYL) 20 MG tablet Place 20 mg into feeding tube every 6 (six) hours as needed (for abdominal cramps).    Yes Historical Provider, MD  diphenhydramine-acetaminophen (TYLENOL PM) 25-500 MG TABS tablet Place 1 tablet into feeding tube at bedtime as needed (for sleep).    Yes Historical Provider, MD  diphenoxylate-atropine (LOMOTIL) 2.5-0.025 MG tablet Place 2 tablets into feeding tube 4 (four) times daily as needed for diarrhea or loose stools.   Yes Historical Provider, MD  hydrOXYzine (ATARAX/VISTARIL) 25 MG tablet Place 25 mg into feeding tube 3 (three) times daily as needed for itching.    Yes Historical Provider, MD  LORazepam (ATIVAN) 1 MG tablet Take 0.5-1 mg by mouth every 4 (four) hours as needed for anxiety.   Yes Historical Provider, MD  metoCLOPramide (REGLAN) 5 MG/5ML solution  Place 5 mLs (5 mg total) into feeding tube every 6 (six) hours as needed for nausea or vomiting (abdominal distension). 03/21/16  Yes Nathan Canterbury, MD  midodrine (PROAMATINE) 5 MG tablet Place 5 mg into feeding tube 3 (three) times daily with meals.    Yes Historical Provider, MD  Multiple Vitamin (MULTIVITAMIN WITH MINERALS) TABS tablet Place 1 tablet into feeding tube daily.    Yes Historical Provider, MD  mupirocin ointment (BACTROBAN) 2 % Apply 1 application topically 2 (two) times daily.    Yes Historical Provider, MD  Nutritional Supplements (FEEDING SUPPLEMENT, OSMOLITE 1.5 CAL,) LIQD Place 237 mLs into feeding tube 3 (three) times daily.    Yes Historical Provider, MD  Nutritional Supplements (TWOCAL HN) LIQD Place 237 mLs into feeding tube 3 (three) times daily.   Yes Historical Provider, MD  omeprazole (PRILOSEC) 40 MG capsule 40 mg daily as needed (per tube for acid reflux).    Yes Historical Provider, MD  ondansetron (ZOFRAN) 8 MG tablet Place 8 mg into feeding tube every 8 (eight) hours as needed for nausea or vomiting.    Yes Historical Provider, MD  oxyCODONE (ROXICODONE) 5 MG/5ML solution Take 10 mLs (10 mg total) by mouth every 4 (four) hours as needed for moderate pain or severe pain. 03/21/16  Yes Nathan Canterbury, MD  promethazine (PHENERGAN) 25 MG tablet Place 25 mg into feeding tube every 6 (six) hours as needed for nausea or vomiting.    Yes Historical Provider, MD  senna (SENOKOT) 8.6 MG tablet Place 1 tablet into feeding tube daily as needed for constipation.    Yes Historical Provider, MD  simethicone (MYLICON) 40 FT/7.3UK drops Place 1.2 mLs (80 mg total) into feeding tube 4 (four) times daily. 03/21/16  Yes Nathan Canterbury, MD  triamcinolone ointment (KENALOG) 0.1 % Apply 1 application topically 2 (two) times daily. Pt applies to both feet.   Yes Historical Provider, MD  vitamin C (ASCORBIC ACID) 500 MG tablet Place 500 mg into feeding tube daily.    Yes Historical  Provider,  MD   Allergies  Allergen Reactions  . Augmentin [Amoxicillin-Pot Clavulanate] Diarrhea and Other (See Comments)    .Has patient had a PCN reaction causing immediate rash, facial/tongue/throat swelling, SOB or lightheadedness with hypotension: No Has patient had a PCN reaction causing severe rash involving mucus membranes or skin necrosis: No Has patient had a PCN reaction that required hospitalization No Has patient had a PCN reaction occurring within the last 10 years: Yes If all of the above answers are "NO", then may proceed with Cephalosporin use.   . Fentanyl Other (See Comments)    Reaction:  Hallucinations   . Scopolamine Other (See Comments)    Reaction:  Hallucinations   . Zolpidem Tartrate Diarrhea  . Morphine And Related Itching  . Carboplatin Itching and Rash  . Levaquin [Levofloxacin] Other (See Comments)    Reaction:  Knee pain    Review of Systems  Unable to perform ROS   Physical Exam  Constitutional: He appears distressed.  cachetic  HENT:  Head: Normocephalic and atraumatic.  Eyes: Scleral icterus is present.  Cardiovascular:  Tachycardic, irregular  Pulmonary/Chest: He is in respiratory distress. He has wheezes. He has rales.  Abdominal: Soft. Bowel sounds are normal.  Musculoskeletal: He exhibits edema. He exhibits no tenderness or deformity.  Neurological:  Somnolent, confused, follows commands  Skin: Skin is warm and dry. Capillary refill takes 2 to 3 seconds.  jaundice  Psychiatric:  Agitated at times  Nursing note and vitals reviewed.   Vital Signs: BP 93/60   Pulse 83   Temp 98 F (36.7 C)   Resp (!) 30   Ht 5' 7"  (1.702 m)   Wt 66.4 kg (146 lb 6.2 oz)   SpO2 93%   BMI 22.93 kg/m  Pain Assessment: No/denies pain   Pain Score: Asleep   SpO2: SpO2: 93 % O2 Device:SpO2: 93 % O2 Flow Rate: .O2 Flow Rate (L/min): 15 L/min  IO: Intake/output summary:  Intake/Output Summary (Last 24 hours) at 03/23/16 1658 Last data filed  at 03/23/16 1500  Gross per 24 hour  Intake          2534.08 ml  Output              800 ml  Net          1734.08 ml    LBM: Last BM Date: 03/23/16 Baseline Weight: Weight: 62.1 kg (137 lb) Most recent weight: Weight: 66.4 kg (146 lb 6.2 oz)     Palliative Assessment/Data: PPS: 10%     Thank you for this consult. Palliative medicine will continue to follow and assist as needed.   Time In:1530 Time Out: 1655 Time Total: 85 minutes Greater than 50%  of this time was spent counseling and coordinating care related to the above assessment and plan.  Signed by: Mariana Kaufman, AGNP-C Palliative Medicine    Please contact Palliative Medicine Team phone at [phone] for questions and concerns.  For individual provider: See Shea Evans

## 2016-03-23 NOTE — Progress Notes (Signed)
Subjective: The patient is seen and examined today. His wife was at the bedside. He is in significant respiratory distress and currently on BiPAP. He was admitted with questionable aspiration pneumonia. He is currently on treatment with Zosyn and vancomycin with no significant improvement in his condition. He has no current fever or chills. His respiratory rate was over 40 when seen today. Blood pressure is also low.  Objective: Vital signs in last 24 hours: Temp:  [97.5 F (36.4 C)-98.2 F (36.8 C)] 98 F (36.7 C) (12/26 1600) Pulse Rate:  [72-104] 83 (12/26 1654) Resp:  [21-48] 30 (12/26 1654) BP: (85-179)/(49-99) 93/60 (12/26 1600) SpO2:  [86 %-100 %] 93 % (12/26 1654) FiO2 (%):  [40 %] 40 % (12/26 1238)  Intake/Output from previous day: 12/25 0701 - 12/26 0700 In: 3836.6 [I.V.:1959.6; NG/GT:477; IV Piggyback:1400] Out: 975 [Urine:975] Intake/Output this shift: Total I/O In: 437.5 [I.V.:237.5; IV Piggyback:200] Out: -   General appearance: fatigued, icteric, moderate distress and uncooperative Resp: rales bilaterally Cardio: Tachycardic and tachypneic GI: Moderately distended Extremitie1+edema 1+  Lab Results:   Recent Labs  03/22/16 0511 03/23/16 0307  WBC 16.9* 15.5*  HGB 10.6* 9.8*  HCT 30.1* 28.3*  PLT 152 142*   BMET  Recent Labs  03/22/16 0511 03/23/16 0307  NA 143 146*  K 4.2 3.5  CL 113* 120*  CO2 20* 17*  GLUCOSE 142* 173*  BUN 32* 32*  CREATININE 1.27* 1.11  CALCIUM 8.7* 8.3*    Studies/Results: Dg Chest Port 1 View  Result Date: 03/23/2016 CLINICAL DATA:  Shortness of Breath EXAM: PORTABLE CHEST 1 VIEW COMPARISON:  03/22/2016 FINDINGS: Cardiac shadow is stable. Aortic calcifications are again identified. Patchy ground-glass infiltrate is noted bilaterally worse on the left than the right. This is increased particularly in the left base when compared with the prior exam. Right upper quadrant biliary catheter is noted. IMPRESSION: Increasing  ground-glass infiltrate particularly in the left lung base. Electronically Signed   By: [name] M.D.   On: 03/23/2016 08:29   Dg Chest Port 1 View  Result Date: 03/22/2016 CLINICAL DATA:  Shortness of breath.  Awoke with cough. EXAM: PORTABLE CHEST 1 VIEW COMPARISON:  Radiographs 03/19/2016 FINDINGS: New from prior exam patchy and ground-glass opacities in the left upper lung zone and right perihilar lung. There is probable pleural thickening or fluid laterally in the lower right lung. Chronic changes in the right paramediastinal region are again seen. No evidence of pneumothorax. Normal heart size. Stable osseous structures. IMPRESSION: Patchy and ground-glass opacities in the right perihilar lung and left upper lobe, may be pneumonia or aspiration, new from exam 3 days prior. Electronically Signed   By: [name] M.D.   On: 03/22/2016 05:55    Medications: I have reviewed the patient's current medications.  CODE STATUS: No CODE BLUE  Assessment/Plan: 1) metastatic non-small cell lung cancer diagnosed more than 7 years ago status post several chemotherapy regimens and most recently treated with immunotherapy with [place] (pembrolizumab). This was discontinued secondary to significant liver dysfunction. Unfortunately the patient has very poor prognosis and his current condition is declining rapidly. I had a lengthy discussion with the wife today about his condition and I strongly encouraged her to consider cough or care at this point. 2) acute respiratory failure: Likely secondary to aspiration pneumonia and sepsis. Continue current supportive care with intravenous Zosyn and vancomycin. Continue supportive oxygenation. 3) hepatic dysfunction: Secondary to disease progression was biliary obstruction but also could be secondary to immunotherapy  mediated hepatitis secondary to treatment with [place] (pembrolizumab). We will continue with the high-dose steroids. 4) prognosis: Very poor. I  had a lengthy discussion with the wife about the goals of care and she would be interested in comfort care if no improvement in the next day or 2. Thank you so much for taking good care of [name], I will continue to follow up the patient with you and assist in his management on as-needed basis.   LOS: 1 day    [name] K. 03/23/2016

## 2016-03-23 NOTE — Progress Notes (Signed)
Pt feeling nauseous so BiPaP removed. RT and RN in room and pt switched to HFNC at 14L.

## 2016-03-23 NOTE — Progress Notes (Signed)
Initial Nutrition Assessment  INTERVENTION:   Tube Feeding recommendations: Continuous infusion of Osmolite 1.5 @ 30 ml/hr, advance by 10 ml every 12 hours to goal rate of 65 ml/hr. Recommend free water infusion at 50 ml/hr (1200 ml daily). This tube feeding regimen would provide 2340 kcal (100% of needs), 97g protein, and 2388 ml H2O.   RD will continue to monitor   NUTRITION DIAGNOSIS:   Inadequate oral intake related to dysphagia, inability to eat as evidenced by other (see comment) (CLD, PEG ).  GOAL:   Patient will meet greater than or equal to 90% of their needs  MONITOR:   Labs, Weight trends, TF tolerance, I & O's  REASON FOR ASSESSMENT:   Malnutrition Screening Tool    ASSESSMENT:    70 y.o. 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 Despite a common bile duct drain and a percutaneous biliary drain he has had rising liver function tests and bilirubin over the last 2 months. He is also have problems with chronic diarrhea, abdominal distention resulting in some mild dyspnea. He was hospitalized from 12/22-12/24 due to sepsis from probable cholangitis and discharged home with home health services with anticipation of setting up hospice after the holiday.  Patient in room with wife at bedside. Pt recently discharged then readmitted over the weekend. Pt's wife reports he has been receiving bolus feeds of Osmolite 1.5 since admission and he has been unable to tolerate the volume and has been aspirating. He has been experiencing some bloating with feeds, he has been ordered IV Reglan. Pt's wife reports he receives 24 hour continuous feeds with 3 cans of Osmolite 1.5 and 3 cans of TwoCal HN. He also receives free water of 50 ml/hr over 24 hours (provides 1200 ml daily). Pt's wife is wondering if he should just receive one formula instead of two. RD recommended one formula at a lower rate which MD has already ordered in the  system. Will monitor for tolerance and ability to advance. Osmolite 1.5 will run at 30 ml/hr per MD.  Pt's wife reports he has lost 40 lb since his illness but he has been able to gain weight over the past year. UBW: 137 lb. Weight is up and this is most likely d/t fluid. Unable to perform NFPE d/t MD visit. Noted depletion in clavicle and temporal regions but unable to determine severity.  Medications: IV Reglan every 6 hours, D5 w/ KCl infusion at 50 ml/hr -provides 204 kcal Labs reviewed: Elevated Na  Diet Order:  Diet clear liquid Room service appropriate? Yes; Fluid consistency: Thin  Skin:  Reviewed, no issues  Last BM:  12/26  Height:   Ht Readings from Last 1 Encounters:  03/22/16 '5\' 7"'$  (1.702 m)    Weight:   Wt Readings from Last 1 Encounters:  03/22/16 146 lb 6.2 oz (66.4 kg)    Ideal Body Weight:  67.2 kg  BMI:  Body mass index is 22.93 kg/m.  Estimated Nutritional Needs:   Kcal:  2200-2500  Protein:  95-105g  Fluid:  1.9-2L/day  EDUCATION NEEDS:   No education needs identified at this time  Clayton Bibles, MS, RD, LDN Pager: [phone] After Hours Pager: [phone]

## 2016-03-24 ENCOUNTER — Encounter: Payer: Medicare Other | Admitting: Nurse Practitioner

## 2016-03-24 ENCOUNTER — Other Ambulatory Visit: Payer: Medicare Other

## 2016-03-24 DIAGNOSIS — K831 Obstruction of bile duct: Secondary | ICD-10-CM

## 2016-03-24 DIAGNOSIS — J690 Pneumonitis due to inhalation of food and vomit: Secondary | ICD-10-CM

## 2016-03-24 DIAGNOSIS — K7290 Hepatic failure, unspecified without coma: Secondary | ICD-10-CM

## 2016-03-24 DIAGNOSIS — C3492 Malignant neoplasm of unspecified part of left bronchus or lung: Secondary | ICD-10-CM

## 2016-03-24 DIAGNOSIS — R197 Diarrhea, unspecified: Secondary | ICD-10-CM

## 2016-03-24 DIAGNOSIS — Z515 Encounter for palliative care: Secondary | ICD-10-CM

## 2016-03-24 DIAGNOSIS — J9601 Acute respiratory failure with hypoxia: Secondary | ICD-10-CM

## 2016-03-24 LAB — PROTIME-INR
INR: 1.83
PROTHROMBIN TIME: 21.4 s — AB (ref 11.4–15.2)

## 2016-03-24 LAB — COMPREHENSIVE METABOLIC PANEL
ALK PHOS: 299 U/L — AB (ref 38–126)
ALT: 152 U/L — AB (ref 17–63)
ANION GAP: 8 (ref 5–15)
AST: 135 U/L — ABNORMAL HIGH (ref 15–41)
Albumin: 2.7 g/dL — ABNORMAL LOW (ref 3.5–5.0)
BILIRUBIN TOTAL: 12.9 mg/dL — AB (ref 0.3–1.2)
BUN: 35 mg/dL — ABNORMAL HIGH (ref 6–20)
CALCIUM: 8.6 mg/dL — AB (ref 8.9–10.3)
CO2: 17 mmol/L — AB (ref 22–32)
CREATININE: 0.89 mg/dL (ref 0.61–1.24)
Chloride: 120 mmol/L — ABNORMAL HIGH (ref 101–111)
GFR calc Af Amer: >60 mL/min (ref >60)
Glucose, Bld: 154 mg/dL — ABNORMAL HIGH (ref 65–99)
Potassium: 4.1 mmol/L (ref 3.5–5.1)
SODIUM: 145 mmol/L (ref 135–145)
TOTAL PROTEIN: 6.1 g/dL — AB (ref 6.5–8.1)

## 2016-03-24 LAB — CBC
HCT: 35.4 % — ABNORMAL LOW (ref 39.0–52.0)
HEMOGLOBIN: 11.7 g/dL — AB (ref 13.0–17.0)
MCH: 34.9 pg — ABNORMAL HIGH (ref 26.0–34.0)
MCHC: 33.1 g/dL (ref 30.0–36.0)
MCV: 105.7 fL — ABNORMAL HIGH (ref 78.0–100.0)
PLATELETS: 196 10*3/uL (ref 150–400)
RBC: 3.35 MIL/uL — AB (ref 4.22–5.81)
RDW: 15.8 % — ABNORMAL HIGH (ref 11.5–15.5)
WBC: 21.7 10*3/uL — AB (ref 4.0–10.5)

## 2016-03-24 MED ORDER — ACETAMINOPHEN 325 MG PO TABS: 650.0000 mg | ORAL_TABLET | Freq: Four times a day (QID) | ORAL | Status: DC | PRN

## 2016-03-24 MED ORDER — DIPHENHYDRAMINE HCL 50 MG/ML IJ SOLN
12.5000 mg | Freq: Once | INTRAMUSCULAR | Status: AC
  Administered 2016-03-24: 12.5 mg via INTRAVENOUS
  Filled 2016-03-24: qty 1

## 2016-03-24 MED ORDER — ONDANSETRON 4 MG PO TBDP: 4.0000 mg | ORAL_TABLET | Freq: Four times a day (QID) | ORAL | Status: DC | PRN

## 2016-03-24 MED ORDER — TRAZODONE HCL 50 MG PO TABS: 25.0000 mg | ORAL_TABLET | Freq: Every evening | ORAL | Status: DC | PRN

## 2016-03-24 MED ORDER — GLYCOPYRROLATE 1 MG PO TABS
1.0000 mg | ORAL_TABLET | ORAL | Status: DC | PRN
  Filled 2016-03-24: qty 1

## 2016-03-24 MED ORDER — ONDANSETRON HCL 4 MG/2ML IJ SOLN: 4.0000 mg | Freq: Four times a day (QID) | INTRAMUSCULAR | Status: DC | PRN

## 2016-03-24 MED ORDER — ACETAMINOPHEN 650 MG RE SUPP: 650.0000 mg | Freq: Four times a day (QID) | RECTAL | Status: DC | PRN

## 2016-03-24 MED ORDER — HYDROMORPHONE BOLUS VIA INFUSION
1.0000 mg | INTRAVENOUS | Status: DC | PRN
  Administered 2016-03-24 – 2016-03-25 (×5): 1 mg via INTRAVENOUS
  Filled 2016-03-24: qty 1

## 2016-03-24 MED ORDER — HALOPERIDOL LACTATE 5 MG/ML IJ SOLN: 1.0000 mg | INTRAMUSCULAR | Status: DC

## 2016-03-24 MED ORDER — LORAZEPAM 2 MG/ML IJ SOLN
2.0000 mg | INTRAMUSCULAR | Status: DC | PRN
  Administered 2016-03-24 – 2016-03-25 (×5): 2 mg via INTRAVENOUS
  Filled 2016-03-24 (×5): qty 1

## 2016-03-24 MED ORDER — SODIUM CHLORIDE 0.9% FLUSH: 3.0000 mL | INTRAVENOUS | Status: DC | PRN

## 2016-03-24 MED ORDER — GLYCOPYRROLATE 0.2 MG/ML IJ SOLN
0.2000 mg | INTRAMUSCULAR | Status: DC | PRN
  Filled 2016-03-24: qty 1

## 2016-03-24 MED ORDER — SODIUM CHLORIDE 0.9 % IV SOLN: 250.0000 mL | INTRAVENOUS | Status: DC | PRN

## 2016-03-24 MED ORDER — SODIUM CHLORIDE 0.9 % IV SOLN
0.5000 mg/h | INTRAVENOUS | Status: DC
  Administered 2016-03-24: 0.5 mg/h via INTRAVENOUS
  Filled 2016-03-24: qty 5

## 2016-03-24 MED ORDER — HALOPERIDOL LACTATE 5 MG/ML IJ SOLN: 1.0000 mg | Freq: Four times a day (QID) | INTRAMUSCULAR | Status: DC | PRN

## 2016-03-24 MED ORDER — SODIUM CHLORIDE 0.9% FLUSH: 3.0000 mL | Freq: Two times a day (BID) | INTRAVENOUS | Status: DC

## 2016-03-24 MED ORDER — HALOPERIDOL LACTATE 5 MG/ML IJ SOLN: 1.0000 mg | INTRAMUSCULAR | Status: DC | PRN

## 2016-03-24 NOTE — Progress Notes (Signed)
Chaplain visit in response to a [place] in the Pt Record. [name] was asleep when the chaplain visited at 2024. Please page chaplain if [name] needs or requests spiritual care.   Sallee Lange. [name], DMin Night Chaplain

## 2016-03-24 NOTE — Progress Notes (Signed)
RT note:  Patient appears comfortable off Bipap at this time.  SpO2 and RR stable.  RT will continue to monitor.

## 2016-03-24 NOTE — Progress Notes (Signed)
Brief note- full note to follow.  Patient seen in severe resp distress. Lorazepam '2mg'$  IV given with relief. Order added for haldol '1mg'$  q4hr prn. My concern is this patient is dying no matter what interventions are provided. Spouse was not at bedside. Spoke to her via telephone. Patient's nephew is coming this afternoon at 2pm and wife would like to meet with PMT with him present. Meeting planned for this afternoon.  [name], AGNP-C Palliative Medicine  Please call Palliative Medicine team phone with any questions [phone]. For individual providers please see AMION.

## 2016-03-24 NOTE — Progress Notes (Signed)
[facility]  PROGRESS NOTE  Riel Hirschman SJG:[id number] DOB: [birth year] DOA: 03/22/2016 PCP: [name], MD   Brief HPI:   70 y.o.malewith medical history significant of metastatic non-small cell lung cancer with biliary obstruction.  Despite a common bile duct drain and a percutaneous biliary drain he has had rising liver function tests and bilirubin over the last 2 months.  He is also have problems with chronic diarrhea and abdominal distention resulting in some mild dyspnea. He was hospitalized from 12/22-12/24 due to sepsis from probable cholangitis and discharged home with home health services with anticipation of setting up hospice after the holiday. He was DO NOT RESUSCITATE/DO NOT INTUBATE. He was doing well after he returned home but woke up suddenly in the middle the night short of breath with a wet cough.  He had been running tube feeds at the time.  CXR confirmed aspiration pneumonia and he was admitted to stepdown on bipap and broad sprectrum antibiotics.  After readmission, blood culture from his previous admission grew Enterobacter.  Repeat blood cultures from this admission are pending.  He has required continuous bipap and his CXR is looking increasingly like ARDS.  His prognosis is guarded.  Palliative care has been consulted to help with symptom management but I suspect he will have an in-hospital death unless he turns around on bipap.       Subjective   Patient continues to have shortness of breath, currently on BiPAP.   Assessment/Plan:     1. Acute respiratory failure with hypoxia- secondary to aspiration pneumonia, patient has persistent tachypnea and respiratory distress despite being on BiPAP. Continue Zosyn. Dilaudid 1 mg every 30 minutes when necessary. Ativan 2 mg IV every 4 hours when necessary. Palliative care follow-up for symptom management. Sputum culture and blood cultures showed no growth to date. 2. Enterobacter bacteremia and bile duct  infection/cholangitis- it was diagnosed from previous admission, repeat blood cultures are negative to date. Continue Zosyn as above. 3. Biliary obstruction resulting from complications of non-small cell lung cancer- INR and GI have nothing further to offer at this time. Patient started on saline Medrol for chemotherapy-induced hepatitis and possibly for colitis induced diarrhea on 1225. LFTs show an upward trend today AST 135, ALP 152, total bili 12.9. 4. Elevated INR- INR is 1.83, improved, likely from liver failure, patient was given vitamin K 5 mg on 03/12/2016. 5. Diarrhea/colitis- patient started on steroids for possible colitis, continue simethicone, IV Reglan. 6. 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 Leukocytosis-likely from steroids, and underlying aspiration pneumonia     DVT prophylaxis: Lovenox  Code Status: DO NOT RESUSCITATE  Family Communication: No family at bedside   Disposition Plan: To be decided based on patient's condition   Consultants:  Oncology  Palliative care  Procedures:  None   Continuous infusions . dextrose 5 % with KCl 20 mEq / L 20 mEq (03/24/16 1200)      Antibiotics:   Anti-infectives    Start     Dose/Rate Route Frequency Ordered Stop   03/22/16 2200  vancomycin (VANCOCIN) 500 mg in sodium chloride 0.9 % 100 mL IVPB     500 mg 100 mL/hr over 60 Minutes Intravenous Every 12 hours 03/22/16 0551     03/22/16 1800  ceFEPIme (MAXIPIME) 2 g in dextrose 5 % 50 mL IVPB  Status:  Discontinued     2 g 100 mL/hr over 30 Minutes Intravenous Every 12 hours 03/22/16 0551 03/22/16 0839  03/22/16 1400  piperacillin-tazobactam (ZOSYN) IVPB 3.375 g     3.375 g 12.5 mL/hr over 240 Minutes Intravenous Every 8 hours 03/22/16 0845     03/22/16 0545  ceFEPIme (MAXIPIME) 2 g in dextrose 5 % 50 mL IVPB     2 g 100 mL/hr over 30 Minutes Intravenous  Once 03/22/16 0540 03/22/16 0636   03/22/16 0545  vancomycin  (VANCOCIN) IVPB 1000 mg/200 mL premix     1,000 mg 200 mL/hr over 60 Minutes Intravenous  Once 03/22/16 0540 03/22/16 0759       Objective   Vitals:   03/24/16 1000 03/24/16 1017 03/24/16 1100 03/24/16 1200  BP: 139/66   (!) 102/56  Pulse: 99  82 77  Resp: (!) '29  20 19  '$ Temp:      TempSrc:      SpO2: 97% 93% 93% 98%  Weight:      Height:        Intake/Output Summary (Last 24 hours) at 03/24/16 1234 Last data filed at 03/24/16 1200  Gross per 24 hour  Intake           2377.5 ml  Output              125 ml  Net           2252.5 ml   Filed Weights   03/22/16 0508 03/22/16 1532  Weight: 62.1 kg (137 lb) 66.4 kg (146 lb 6.2 oz)     Physical Examination:  General exam: In respiratory distress, on BiPAP Respiratory system: Tachypnea, decreased breath sounds bilaterally Cardiovascular system:  RRR. No  murmurs, rubs, gallops. No pedal edema. GI system: Abdomen is nondistended, soft and nontender. No organomegaly.  Central nervous system. No focal neurological deficits. 5 x 5 power in all extremities. Skin: No rashes, lesions or ulcers.    Data Reviewed: I have personally reviewed following labs and imaging studies  CBG: No results for input(s): GLUCAP in the last 168 hours.  CBC:  Recent Labs Lab 03/19/16 1232 03/20/16 0440 03/21/16 0422 03/22/16 0511 03/23/16 0307 03/24/16 0346  WBC 12.6* 6.8 6.3 16.9* 15.5* 21.7*  NEUTROABS 10.2*  --   --  14.9*  --   --   HGB 11.0* 9.1* 9.4* 10.6* 9.8* 11.7*  HCT 31.4* 26.0* 27.2* 30.1* 28.3* 35.4*  MCV 101.9* 101.6* 101.9* 102.4* 103.3* 105.7*  PLT 149 113* 117* 152 142* 829    Basic Metabolic Panel:  Recent Labs Lab 03/20/16 0440 03/21/16 0422 03/22/16 0511 03/23/16 0307 03/24/16 0346  NA 141 140 143 146* 145  K 3.9 4.2 4.2 3.5 4.1  CL 116* 113* 113* 120* 120*  CO2 18* 19* 20* 17* 17*  GLUCOSE 143* 154* 142* 173* 154*  BUN 42* 31* 32* 32* 35*  CREATININE 1.36* 1.06 1.27* 1.11 0.89  CALCIUM 8.1* 8.1*  8.7* 8.3* 8.6*    Recent Results (from the past 240 hour(s))  Culture, Blood     Status: None (Preliminary result)   Collection Time: 03/19/16 12:33 PM  Result Value Ref Range Status   BLOOD CULTURE, ROUTINE Preliminary report  Preliminary   RESULT 1 Comment  Preliminary    Comment: No growth in 36 - 48 hours.  Culture, blood (single) w Reflex to ID Panel     Status: Abnormal (Preliminary result)   Collection Time: 03/19/16 12:38 PM  Result Value Ref Range Status   BLOOD CULTURE, ROUTINE Preliminary report (A)  Preliminary   RESULT 1 Comment (A)  Preliminary    Comment: Enterobacter cloacae complex Recovered from anaerobic bottle only.    RESULT 2 Comment  Preliminary    Comment: No growth in 36 - 48 hours. Recovered from aerobic bottle only.   Urine culture     Status: None   Collection Time: 03/19/16  5:47 PM  Result Value Ref Range Status   Specimen Description URINE, RANDOM  Final   Special Requests NONE  Final   Culture NO GROWTH Performed at [facility]   Final   Report Status 03/21/2016 FINAL  Final  Gastrointestinal Panel by PCR , Stool     Status: None   Collection Time: 03/19/16  6:12 PM  Result Value Ref Range Status   Campylobacter species NOT DETECTED NOT DETECTED Final   Plesimonas shigelloides NOT DETECTED NOT DETECTED Final   Salmonella species NOT DETECTED NOT DETECTED Final   Yersinia enterocolitica NOT DETECTED NOT DETECTED Final   Vibrio species NOT DETECTED NOT DETECTED Final   Vibrio cholerae NOT DETECTED NOT DETECTED Final   Enteroaggregative E coli (EAEC) NOT DETECTED NOT DETECTED Final   Enteropathogenic E coli (EPEC) NOT DETECTED NOT DETECTED Final   Enterotoxigenic E coli (ETEC) NOT DETECTED NOT DETECTED Final   Shiga like toxin producing E coli (STEC) NOT DETECTED NOT DETECTED Final   Shigella/Enteroinvasive E coli (EIEC) NOT DETECTED NOT DETECTED Final   Cryptosporidium NOT DETECTED NOT DETECTED Final   Cyclospora cayetanensis NOT  DETECTED NOT DETECTED Final   Entamoeba histolytica NOT DETECTED NOT DETECTED Final   Giardia lamblia NOT DETECTED NOT DETECTED Final   Adenovirus F40/41 NOT DETECTED NOT DETECTED Final   Astrovirus NOT DETECTED NOT DETECTED Final   Norovirus GI/GII NOT DETECTED NOT DETECTED Final   Rotavirus A NOT DETECTED NOT DETECTED Final   Sapovirus (I, II, IV, and V) NOT DETECTED NOT DETECTED Final  Body fluid culture     Status: None   Collection Time: 03/20/16  1:50 PM  Result Value Ref Range Status   Specimen Description BILE  Final   Special Requests Normal  Final   Gram Stain   Final    NO WBC SEEN ABUNDANT GRAM NEGATIVE RODS MODERATE GRAM POSITIVE COCCI IN PAIRS RARE GRAM POSITIVE RODS Performed at [facility]    Culture MULTIPLE ORGANISMS PRESENT, NONE PREDOMINANT  Final   Report Status 03/22/2016 FINAL  Final  Blood Culture (routine x 2)     Status: None (Preliminary result)   Collection Time: 03/22/16  5:58 AM  Result Value Ref Range Status   Specimen Description BLOOD LEFT ANTECUBITAL  Final   Special Requests IN PEDIATRIC BOTTLE 4 CC  Final   Culture   Final    NO GROWTH 1 DAY Performed at [facility]    Report Status PENDING  Incomplete  Blood Culture (routine x 2)     Status: None (Preliminary result)   Collection Time: 03/22/16  5:58 AM  Result Value Ref Range Status   Specimen Description BLOOD LEFT HAND  Final   Special Requests BOTTLES DRAWN AEROBIC AND ANAEROBIC 5 CC  Final   Culture   Final    NO GROWTH 1 DAY Performed at [facility]    Report Status PENDING  Incomplete  Urine culture     Status: None   Collection Time: 03/22/16  7:06 AM  Result Value Ref Range Status   Specimen Description URINE, CLEAN CATCH  Final   Special Requests NONE  Final  Culture NO GROWTH Performed at [facility]   Final   Report Status 03/23/2016 FINAL  Final  MRSA PCR Screening     Status: None   Collection Time: 03/22/16  3:22 PM  Result Value  Ref Range Status   MRSA by PCR NEGATIVE NEGATIVE Final    Comment:        The GeneXpert MRSA Assay (FDA approved for NASAL specimens only), is one component of a comprehensive MRSA colonization surveillance program. It is not intended to diagnose MRSA infection nor to guide or monitor treatment for MRSA infections.   Culture, sputum-assessment     Status: None   Collection Time: 03/22/16  5:11 PM  Result Value Ref Range Status   Specimen Description SPUTUM  Final   Special Requests Normal  Final   Sputum evaluation THIS SPECIMEN IS ACCEPTABLE FOR SPUTUM CULTURE  Final   Report Status 03/22/2016 FINAL  Final  Culture, respiratory (NON-Expectorated)     Status: None (Preliminary result)   Collection Time: 03/22/16  5:11 PM  Result Value Ref Range Status   Specimen Description SPUTUM  Final   Special Requests Normal Reflexed from [id number]  Final   Gram Stain   Final    FEW WBC PRESENT,BOTH PMN AND MONONUCLEAR MODERATE SQUAMOUS EPITHELIAL CELLS PRESENT NO ORGANISMS SEEN    Culture   Final    CULTURE REINCUBATED FOR BETTER GROWTH Performed at [facility]    Report Status PENDING  Incomplete     Liver Function Tests:  Recent Labs Lab 03/20/16 0440 03/21/16 0422 03/22/16 0511 03/23/16 0307 03/24/16 0346  AST 93* 118* 127* 112* 135*  ALT 83* 100* 120* 116* 152*  ALKPHOS 236* 328* 373* 291* 299*  BILITOT 10.1* 10.2* 13.8* 11.4* 12.9*  PROT 5.0* 5.2* 6.0* 5.4* 6.1*  ALBUMIN 2.5* 2.5* 2.6* 2.4* 2.7*    Recent Labs Lab 03/19/16 1805  LIPASE 13    Recent Labs Lab 03/21/16 0917  AMMONIA <9*    Cardiac Enzymes:  Recent Labs Lab 03/22/16 0511  TROPONINI 0.04*   BNP (last 3 results)  Recent Labs  03/22/16 0511  BNP 298.3*    ProBNP (last 3 results) No results for input(s): PROBNP in the last 8760 hours.    Studies: Dg Chest Port 1 View  Result Date: 03/23/2016 CLINICAL DATA:  Shortness of Breath EXAM: PORTABLE CHEST 1 VIEW COMPARISON:   03/22/2016 FINDINGS: Cardiac shadow is stable. Aortic calcifications are again identified. Patchy ground-glass infiltrate is noted bilaterally worse on the left than the right. This is increased particularly in the left base when compared with the prior exam. Right upper quadrant biliary catheter is noted. IMPRESSION: Increasing ground-glass infiltrate particularly in the left lung base. Electronically Signed   By: Inez Catalina M.D.   On: 03/23/2016 08:29    Scheduled Meds: . chlorhexidine  15 mL Mouth Rinse BID  . enoxaparin (LOVENOX) injection  40 mg Subcutaneous Q24H  . feeding supplement (OSMOLITE 1.5 CAL)  1,000 mL Per Tube Q24H  . mouth rinse  15 mL Mouth Rinse q12n4p  . methylPREDNISolone (SOLU-MEDROL) injection  40 mg Intravenous Daily  . metoCLOPramide (REGLAN) injection  10 mg Intravenous Q6H  . midodrine  5 mg Per Tube TID WC  . mupirocin ointment  1 application Topical BID  . piperacillin-tazobactam (ZOSYN)  IV  3.375 g Intravenous Q8H  . simethicone  80 mg Per Tube QID  . triamcinolone ointment  1 application Topical BID  . vancomycin  500  mg Intravenous Q12H      Time spent: 25 min  [facility] Pager [phone]. If 7PM-7AM, please contact night-coverage at [email or web address], Office  [phone]  password [email or web address] 03/24/2016, 12:34 PM  LOS: 2 days

## 2016-03-24 NOTE — Progress Notes (Signed)
Daily Progress Note   Patient Name: [name]       Date: 03/24/2016 DOB: [birth year]  Age: 70 y.o. MRN#: [id number] Attending Physician: Oswald Hillock, MD Primary Care Physician: [name], MD Admit Date: 03/22/2016  Reason for Consultation/Follow-up: Establishing goals of care and Terminal Care  Subjective: Patient seen in bed, appears comfortable for the moment. Met with patient's wife and nephew. Discussed patient's current status and overall trajectory and prognosis. Noted patient's respiratory distress requiring several doses of medication for comfort. Discussed that even if ARDS and pneumonia is overcome, this does not change overarching problem of progressing lung cancer and liver failure. Wife and nephew agreed they would like to transition to comfort care and seek residential hospice placement.   Review of Systems  Unable to perform ROS: Acuity of condition    Length of Stay: 2  Current Medications: Scheduled Meds:  . sodium chloride flush  3 mL Intravenous Q12H    Continuous Infusions: . HYDROmorphone      PRN Meds: sodium chloride, acetaminophen **OR** acetaminophen, diphenhydrAMINE, glycopyrrolate **OR** glycopyrrolate **OR** glycopyrrolate, haloperidol lactate, HYDROmorphone, HYDROmorphone (DILAUDID) injection, hydrOXYzine, LORazepam, ondansetron **OR** ondansetron (ZOFRAN) IV, sodium chloride flush, traZODone  Physical Exam  Constitutional: No distress.  cachexic  Eyes: Scleral icterus is present.  Cardiovascular: Normal rate.   Distant heart sounds, peripheral pulses weak  Abdominal: He exhibits distension.  hypoactive  Musculoskeletal: He exhibits edema.  Neurological:  sedated  Skin:  + jaundice  Nursing note and vitals reviewed.            Vital Signs: BP (!) 108/54   Pulse 83   Temp 97.3 F (36.3 C) (Axillary)   Resp 20   Ht 5' 7"  (1.702 m)   Wt 66.4 kg (146 lb 6.2 oz)   SpO2 98%   BMI 22.93 kg/m  SpO2: SpO2: 98 % O2 Device: O2 Device: Bi-PAP O2 Flow Rate: O2 Flow Rate (L/min): 15 L/min  Intake/output summary:  Intake/Output Summary (Last 24 hours) at 03/24/16 1554 Last data filed at 03/24/16 1200  Gross per 24 hour  Intake             1940 ml  Output              125 ml  Net  1815 ml   LBM: Last BM Date: 03/23/16 Baseline Weight: Weight: 62.1 kg (137 lb) Most recent weight: Weight: 66.4 kg (146 lb 6.2 oz)       Palliative Assessment/Data: PPS: 10%      Patient Active Problem List   Diagnosis Date Noted  . Non-small cell carcinoma of lung metastatic to abdomen ([place])   . Advance care planning   . Goals of care, counseling/discussion   . Palliative care by specialist   . Anxiety state   . Shortness of breath   . HCAP (healthcare-associated pneumonia) 03/22/2016  . Acute respiratory failure with hypoxia ([place]) 03/22/2016  . Cholangitis 03/21/2016  . Diarrhea 03/18/2016  . Antineoplastic chemotherapy induced anemia 03/04/2016  . Rash 12/19/2015  . Neck pain 12/19/2015  . Chronic fatigue 11/10/2015  . Bacteremia due to Klebsiella pneumoniae 04/29/2015  . Infection due to Enterobacter cloacae 04/29/2015  . Gram-negative bacteremia 04/25/2015  . Septic shock ([place]) 04/23/2015  . Sepsis ([place])   . Acute kidney injury ([name])   . Abnormality of gait 03/12/2015  . Protein-calorie malnutrition, severe ([place]) 11/26/2014  . Hypoalbuminemia due to protein-calorie malnutrition ([place]) 11/18/2014  . Leukocytosis 11/18/2014  . Dysphagia 11/18/2014  . Dehydration 10/03/2014  . Central line complication 82/99/3716  . Anorexia 10/03/2014  . Weight loss 10/03/2014  . Transaminitis 10/03/2014  . Hyperphosphatemia 10/03/2014  . Encounter for antineoplastic immunotherapy 09/28/2014  . Aspiration  pneumonia ([place]) 09/21/2014  . Non-small cell lung cancer ([place]) 09/21/2014  . 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 Biliary obstruction   . Fatigue 08/27/2014  . Abdominal mass   . CRF (chronic renal failure) 08/07/2014  . Obstructive jaundice 08/07/2014  . Malnutrition of moderate degree ([place]) 08/07/2014  . Obstructive jaundice due to cancer ([place]) 08/06/2014  . Rotator cuff tear, non-traumatic 06/05/2014  . Dizziness and giddiness 04/23/2014  . Tremor 04/23/2014  . Acute on chronic renal failure ([place]) 05/24/2013  . Pancytopenia ([place]) 05/24/2013  . Osteoarthritis of right knee 12/21/2011  . Meniscus, medial, posterior horn derangement 12/21/2011  . Dyspnea 12/04/2010  . Bronchogenic cancer of left lung ([place]) 12/11/2008  . ARTHRITIS 12/10/2008    Palliative Care Assessment & Plan   Patient Profile: 70 y.o. 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 Palliative medicine consulted for symptom management and GOC.   Assessment/Recommendations/Plan   Begin transition to comfort care  Spouse wishes to stop tube feeding, IV fluids, IV antibiotics, but requests Bipap stay in place to give family time to arrive and until transition to BP if bed available  Consult to SW for residential Hospice placement if bed available  Hydromorphone continuous infusion 5m/hr; 145mbolus q30 min prn SOB  Lorazepam 56m31mV q4hr prn agitation, anxiety  Robinul 1mg41m or .56mg 656mq or IV q 4 hours prn terminal secretions  D/C tube feedings and other medications that do not have comfort intent  Goals of Care and Additional Recommendations:  Limitations on Scope of Treatment: Full Comfort Care  Code Status:  DNR  Prognosis:   Hours - Days d/t  ARDS, pneumonia in setting of advanced lung cancer and liver failure- planned transition to comfort care  Discharge Planning:  Hospice facility pending bed availability  Care plan was discussed with patient's spouse and nephew.   Thank you for allowing  the Palliative Medicine Team to assist in the care of this patient.   Time In: 1500 Time Out: 1605 Total Time 65 mins Prolonged Time Billed No      Greater than 50%  of this time was spent counseling and coordinating care related to the above assessment and plan.  Mariana Kaufman, AGNP-C Palliative Medicine   Please contact Palliative Medicine Team phone at [phone] for questions and concerns.

## 2016-03-25 ENCOUNTER — Encounter: Payer: Medicare Other | Admitting: Nutrition

## 2016-03-25 ENCOUNTER — Ambulatory Visit: Payer: Medicare Other

## 2016-03-25 ENCOUNTER — Other Ambulatory Visit: Payer: Medicare Other

## 2016-03-25 ENCOUNTER — Ambulatory Visit: Payer: Medicare Other | Admitting: Internal Medicine

## 2016-03-25 LAB — CULTURE, BLOOD (SINGLE)

## 2016-03-25 LAB — CULTURE, RESPIRATORY W GRAM STAIN: Culture: NORMAL

## 2016-03-25 LAB — CULTURE, RESPIRATORY: SPECIAL REQUESTS: NORMAL

## 2016-03-25 MED ORDER — GLYCOPYRROLATE 1 MG PO TABS: 1.0000 mg | ORAL_TABLET | ORAL | Status: AC | PRN

## 2016-03-25 MED ORDER — ACETAMINOPHEN 650 MG RE SUPP: 650.0000 mg | Freq: Four times a day (QID) | RECTAL | 0 refills | Status: AC | PRN

## 2016-03-25 MED ORDER — SODIUM CHLORIDE 0.9 % IV SOLN: 0.5000 mg/h | INTRAVENOUS | Status: AC

## 2016-03-25 MED ORDER — ACETAMINOPHEN 325 MG PO TABS: 650.0000 mg | ORAL_TABLET | Freq: Four times a day (QID) | ORAL | Status: AC | PRN

## 2016-03-25 MED ORDER — HYDROMORPHONE BOLUS VIA INFUSION: 1.0000 mg | INTRAVENOUS | 0 refills | Status: AC | PRN

## 2016-03-25 MED ORDER — ONDANSETRON 4 MG PO TBDP: 4.0000 mg | ORAL_TABLET | Freq: Four times a day (QID) | ORAL | 0 refills | Status: AC | PRN

## 2016-03-25 NOTE — Progress Notes (Signed)
   03/25/16 1000  Clinical Encounter Type  Visited With Health care provider;Patient  Visit Type Spiritual support;Initial;Critical Care  Referral From Nurse  Consult/Referral To Chaplain  Spiritual Encounters  Spiritual Needs Emotional;Other (Comment) (Pastoral Conversation/Support)  Stress Factors  Patient Stress Factors Not reviewed  Family Stress Factors Not reviewed   I rounded on the patient per Spiritual Care consult. The patient was not awake and was not able to talk.  I spoke with the nurse and she said that if the patient's wife needs Spiritual Care that she would page [place].    Please, contact Spiritual Care for further assistance.   [place] M.Div.

## 2016-03-25 NOTE — Progress Notes (Signed)
[facility]  PROGRESS NOTE  [name] RKY:[id number] DOB: 04-06-1945 DOA: 03/22/2016 PCP: [name], MD   Brief HPI:   70 y.o.malewith medical history significant of metastatic non-small cell lung cancer with biliary obstruction.  Despite a common bile duct drain and a percutaneous biliary drain he has had rising liver function tests and bilirubin over the last 2 months.  He is also have problems with chronic diarrhea and abdominal distention resulting in some mild dyspnea. He was hospitalized from 12/22-12/24 due to sepsis from probable cholangitis and discharged home with home health services with anticipation of setting up hospice after the holiday. He was DO NOT RESUSCITATE/DO NOT INTUBATE. He was doing well after he returned home but woke up suddenly in the middle the night short of breath with a wet cough.  He had been running tube feeds at the time.  CXR confirmed aspiration pneumonia and he was admitted to stepdown on bipap and broad sprectrum antibiotics.  After readmission, blood culture from his previous admission grew Enterobacter.  Repeat blood cultures from this admission are pending.  He has required continuous bipap and his CXR is looking increasingly like ARDS.  His prognosis is guarded.  Palliative care has been consulted to help with symptom management but I suspect he will have an in-hospital death unless he turns around on bipap.       Subjective   Patient started on Dilaudid drip, comfortable.   Assessment/Plan:     1. Acute respiratory failure with hypoxia- secondary to aspiration pneumonia, patient has persistent tachypnea and respiratory distress despite being on BiPAP. Continue Zosyn. Dilaudid 0.5 mg/hr infusion. Ativan 2 mg IV every 4 hours when necessary. Palliative care follow-up for symptom management. Sputum culture and blood cultures showed no growth to date. 2. Enterobacter bacteremia and bile duct infection/cholangitis- it was diagnosed  from previous admission, repeat blood cultures are negative to date. Zosyn discontinued. 3. Biliary obstruction resulting from complications of non-small cell lung cancer- INR and GI have nothing further to offer at this time. Patient started on saline Medrol for chemotherapy-induced hepatitis and possibly for colitis induced diarrhea on 1225. LFTs show an upward trend today AST 135, ALP 152, total bili 12.9. 4. Elevated INR- INR is 1.83, improved, likely from liver failure, patient was given vitamin K 5 mg on 03/12/2016. 5. Diarrhea/colitis- patient started on steroids for possible colitis, continue simethicone, IV Reglan. 6. 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 Leukocytosis-likely from steroids, and underlying aspiration pneumonia     DVT prophylaxis: Lovenox  Code Status: DO NOT RESUSCITATE  Family Communication: No family at bedside   Disposition Plan: To be decided based on patient's condition   Consultants:  Oncology  Palliative care  Procedures:  None   Continuous infusions . HYDROmorphone 0.5 mg/hr (03/25/16 1100)      Antibiotics:   Anti-infectives    Start     Dose/Rate Route Frequency Ordered Stop   03/22/16 2200  vancomycin (VANCOCIN) 500 mg in sodium chloride 0.9 % 100 mL IVPB  Status:  Discontinued     500 mg 100 mL/hr over 60 Minutes Intravenous Every 12 hours 03/22/16 0551 03/24/16 1547   03/22/16 1800  ceFEPIme (MAXIPIME) 2 g in dextrose 5 % 50 mL IVPB  Status:  Discontinued     2 g 100 mL/hr over 30 Minutes Intravenous Every 12 hours 03/22/16 0551 03/22/16 0839   03/22/16 1400  piperacillin-tazobactam (ZOSYN) IVPB 3.375 g  Status:  Discontinued  3.375 g 12.5 mL/hr over 240 Minutes Intravenous Every 8 hours 03/22/16 0845 03/24/16 1547   03/22/16 0545  ceFEPIme (MAXIPIME) 2 g in dextrose 5 % 50 mL IVPB     2 g 100 mL/hr over 30 Minutes Intravenous  Once 03/22/16 0540 03/22/16 0636   03/22/16 0545  vancomycin  (VANCOCIN) IVPB 1000 mg/200 mL premix     1,000 mg 200 mL/hr over 60 Minutes Intravenous  Once 03/22/16 0540 03/22/16 0759       Objective   Vitals:   03/25/16 0800 03/25/16 0900 03/25/16 1000 03/25/16 1100  BP: 124/70     Pulse: 91 88 94 98  Resp: (!) 8 (!) 8 10 (!) 6  Temp:      TempSrc:      SpO2: 98% 97% 96% 96%  Weight:      Height:        Intake/Output Summary (Last 24 hours) at 03/25/16 1357 Last data filed at 03/25/16 1100  Gross per 24 hour  Intake             18.5 ml  Output              200 ml  Net           -181.5 ml   Filed Weights   03/22/16 0508 03/22/16 1532  Weight: 62.1 kg (137 lb) 66.4 kg (146 lb 6.2 oz)     Physical Examination:  General exam: comfortable Respiratory system: clear bilaterally Cardiovascular system:  RRR. No  murmurs, rubs, gallops. No pedal edema. GI system: Abdomen is nondistended, soft and nontender. No organomegaly.  Central nervous system. No focal neurological deficits. 5 x 5 power in all extremities. Skin: No rashes, lesions or ulcers.    Data Reviewed: I have personally reviewed following labs and imaging studies  CBG: No results for input(s): GLUCAP in the last 168 hours.  CBC:  Recent Labs Lab 03/19/16 1232 03/20/16 0440 03/21/16 0422 03/22/16 0511 03/23/16 0307 03/24/16 0346  WBC 12.6* 6.8 6.3 16.9* 15.5* 21.7*  NEUTROABS 10.2*  --   --  14.9*  --   --   HGB 11.0* 9.1* 9.4* 10.6* 9.8* 11.7*  HCT 31.4* 26.0* 27.2* 30.1* 28.3* 35.4*  MCV 101.9* 101.6* 101.9* 102.4* 103.3* 105.7*  PLT 149 113* 117* 152 142* 834    Basic Metabolic Panel:  Recent Labs Lab 03/20/16 0440 03/21/16 0422 03/22/16 0511 03/23/16 0307 03/24/16 0346  NA 141 140 143 146* 145  K 3.9 4.2 4.2 3.5 4.1  CL 116* 113* 113* 120* 120*  CO2 18* 19* 20* 17* 17*  GLUCOSE 143* 154* 142* 173* 154*  BUN 42* 31* 32* 32* 35*  CREATININE 1.36* 1.06 1.27* 1.11 0.89  CALCIUM 8.1* 8.1* 8.7* 8.3* 8.6*    Recent Results (from the past 240  hour(s))  Culture, Blood     Status: None   Collection Time: 03/19/16 12:33 PM  Result Value Ref Range Status   BLOOD CULTURE, ROUTINE Final report  Final   RESULT 1 Comment  Final    Comment: No aerobic or anaerobic growth in five days.  Culture, blood (single) w Reflex to ID Panel     Status: Abnormal (Preliminary result)   Collection Time: 03/19/16 12:38 PM  Result Value Ref Range Status   BLOOD CULTURE, ROUTINE Preliminary report (A)  Preliminary   RESULT 1 Comment (A)  Preliminary    Comment: Enterobacter cloacae complex Recovered from anaerobic bottle only.    RESULT  2 Comment  Preliminary    Comment: No growth in 36 - 48 hours. Recovered from aerobic bottle only.   Urine culture     Status: None   Collection Time: 03/19/16  5:47 PM  Result Value Ref Range Status   Specimen Description URINE, RANDOM  Final   Special Requests NONE  Final   Culture NO GROWTH Performed at [facility]   Final   Report Status 03/21/2016 FINAL  Final  Gastrointestinal Panel by PCR , Stool     Status: None   Collection Time: 03/19/16  6:12 PM  Result Value Ref Range Status   Campylobacter species NOT DETECTED NOT DETECTED Final   Plesimonas shigelloides NOT DETECTED NOT DETECTED Final   Salmonella species NOT DETECTED NOT DETECTED Final   Yersinia enterocolitica NOT DETECTED NOT DETECTED Final   Vibrio species NOT DETECTED NOT DETECTED Final   Vibrio cholerae NOT DETECTED NOT DETECTED Final   Enteroaggregative E coli (EAEC) NOT DETECTED NOT DETECTED Final   Enteropathogenic E coli (EPEC) NOT DETECTED NOT DETECTED Final   Enterotoxigenic E coli (ETEC) NOT DETECTED NOT DETECTED Final   Shiga like toxin producing E coli (STEC) NOT DETECTED NOT DETECTED Final   Shigella/Enteroinvasive E coli (EIEC) NOT DETECTED NOT DETECTED Final   Cryptosporidium NOT DETECTED NOT DETECTED Final   Cyclospora cayetanensis NOT DETECTED NOT DETECTED Final   Entamoeba histolytica NOT DETECTED NOT DETECTED  Final   Giardia lamblia NOT DETECTED NOT DETECTED Final   Adenovirus F40/41 NOT DETECTED NOT DETECTED Final   Astrovirus NOT DETECTED NOT DETECTED Final   Norovirus GI/GII NOT DETECTED NOT DETECTED Final   Rotavirus A NOT DETECTED NOT DETECTED Final   Sapovirus (I, II, IV, and V) NOT DETECTED NOT DETECTED Final  Body fluid culture     Status: None   Collection Time: 03/20/16  1:50 PM  Result Value Ref Range Status   Specimen Description BILE  Final   Special Requests Normal  Final   Gram Stain   Final    NO WBC SEEN ABUNDANT GRAM NEGATIVE RODS MODERATE GRAM POSITIVE COCCI IN PAIRS RARE GRAM POSITIVE RODS Performed at [facility]    Culture MULTIPLE ORGANISMS PRESENT, NONE PREDOMINANT  Final   Report Status 03/22/2016 FINAL  Final  Blood Culture (routine x 2)     Status: None (Preliminary result)   Collection Time: 03/22/16  5:58 AM  Result Value Ref Range Status   Specimen Description BLOOD LEFT ANTECUBITAL  Final   Special Requests IN PEDIATRIC BOTTLE 4 CC  Final   Culture   Final    NO GROWTH 3 DAYS Performed at [facility]    Report Status PENDING  Incomplete  Blood Culture (routine x 2)     Status: None (Preliminary result)   Collection Time: 03/22/16  5:58 AM  Result Value Ref Range Status   Specimen Description BLOOD LEFT HAND  Final   Special Requests BOTTLES DRAWN AEROBIC AND ANAEROBIC 5 CC  Final   Culture   Final    NO GROWTH 3 DAYS Performed at [facility]    Report Status PENDING  Incomplete  Urine culture     Status: None   Collection Time: 03/22/16  7:06 AM  Result Value Ref Range Status   Specimen Description URINE, CLEAN CATCH  Final   Special Requests NONE  Final   Culture NO GROWTH Performed at [facility]   Final   Report Status 03/23/2016 FINAL  Final  MRSA PCR Screening     Status: None   Collection Time: 03/22/16  3:22 PM  Result Value Ref Range Status   MRSA by PCR NEGATIVE NEGATIVE Final    Comment:         The GeneXpert MRSA Assay (FDA approved for NASAL specimens only), is one component of a comprehensive MRSA colonization surveillance program. It is not intended to diagnose MRSA infection nor to guide or monitor treatment for MRSA infections.   Culture, sputum-assessment     Status: None   Collection Time: 03/22/16  5:11 PM  Result Value Ref Range Status   Specimen Description SPUTUM  Final   Special Requests Normal  Final   Sputum evaluation THIS SPECIMEN IS ACCEPTABLE FOR SPUTUM CULTURE  Final   Report Status 03/22/2016 FINAL  Final  Culture, respiratory (NON-Expectorated)     Status: None   Collection Time: 03/22/16  5:11 PM  Result Value Ref Range Status   Specimen Description SPUTUM  Final   Special Requests Normal Reflexed from [id number]  Final   Gram Stain   Final    FEW WBC PRESENT,BOTH PMN AND MONONUCLEAR MODERATE SQUAMOUS EPITHELIAL CELLS PRESENT NO ORGANISMS SEEN    Culture   Final    Consistent with normal respiratory flora. Performed at [facility]    Report Status 03/25/2016 FINAL  Final     Liver Function Tests:  Recent Labs Lab 03/20/16 0440 03/21/16 0422 03/22/16 0511 03/23/16 0307 03/24/16 0346  AST 93* 118* 127* 112* 135*  ALT 83* 100* 120* 116* 152*  ALKPHOS 236* 328* 373* 291* 299*  BILITOT 10.1* 10.2* 13.8* 11.4* 12.9*  PROT 5.0* 5.2* 6.0* 5.4* 6.1*  ALBUMIN 2.5* 2.5* 2.6* 2.4* 2.7*    Recent Labs Lab 03/19/16 1805  LIPASE 13    Recent Labs Lab 03/21/16 0917  AMMONIA <9*    Cardiac Enzymes:  Recent Labs Lab 03/22/16 0511  TROPONINI 0.04*   BNP (last 3 results)  Recent Labs  03/22/16 0511  BNP 298.3*    ProBNP (last 3 results) No results for input(s): PROBNP in the last 8760 hours.    Studies: No results found.  Scheduled Meds: . sodium chloride flush  3 mL Intravenous Q12H      Time spent: 25 min  [facility] Pager [phone]. If 7PM-7AM, please contact night-coverage at  [email or web address], Office  [phone]  password [email or web address] 03/25/2016, 1:57 PM  LOS: 3 days

## 2016-03-25 NOTE — Progress Notes (Signed)
Nutrition Brief Note  Chart reviewed. Pt now transitioning to comfort care.  No further nutrition interventions warranted at this time.  Please consult as needed.   Clayton Bibles, MS, RD, LDN Pager: [phone] After Hours Pager: [phone]

## 2016-03-25 NOTE — Progress Notes (Signed)
[facility] Liaison note  Received request from [facility] for family interest in [facility].  Chart reviewed.  Patient's wife is agreeable to transfer today.  CSW and bedside RN aware.  Met with patient's wife to confirm interest and explain services.    Registration paperwork completed.  Dr [name] to assume care, per family request.    Please fax discharge summary to:  [phone]. RN:  Please call report to [facility] at [phone].  Please arrange transport for patient to arrive as soon as possible.  Spoke with Alyse Low, RN, to request patient be medicated prior to transfer.    Thank you for all assistance. Efrain Sella, RN, BSN [facility] Liaisons are now on AMION.  I can be reached before 5 today at [phone].  [facility] 24/7 number is [phone]

## 2016-03-25 NOTE — Consult Note (Signed)
   [facility] CM Inpatient Consult   03/25/2016  Shayon Trompeter 09-Jun-1945 [id number]   Patient screened for [facility] services. Noted current disposition plan is for residential hospice. [facility] Care Management not appropriate at this time.    Marthenia Rolling, MSN-Ed, RN,BSN [facility] Liaison [phone]

## 2016-03-25 NOTE — Progress Notes (Signed)
Daily Progress Note   Patient Name: [name]       Date: 03/25/2016 DOB: 02-05-1946  Age: 70 y.o. MRN#: [id number] Attending Physician: Oswald Hillock, MD Primary Care Physician: [name], MD Admit Date: 03/22/2016  Reason for Consultation/Follow-up: Establishing goals of care and Terminal Care  Subjective: Evaluated patient in bed. Appears actively dying, but comfortable. Off Bipap. Bedside RN reports distress well managed with hydromorphone drip, prn boluses and prn lorazepam. Awaiting residential hospice consult.    Review of Systems  Unable to perform ROS: Acuity of condition    Length of Stay: 3  Current Medications: Scheduled Meds:  . sodium chloride flush  3 mL Intravenous Q12H    Continuous Infusions: . HYDROmorphone 0.5 mg/hr (03/25/16 1000)    PRN Meds: sodium chloride, acetaminophen **OR** acetaminophen, diphenhydrAMINE, glycopyrrolate **OR** glycopyrrolate **OR** glycopyrrolate, haloperidol lactate, HYDROmorphone, HYDROmorphone (DILAUDID) injection, hydrOXYzine, LORazepam, ondansetron **OR** ondansetron (ZOFRAN) IV, sodium chloride flush, traZODone  Physical Exam  Constitutional: No distress.  cachexic  Eyes: Scleral icterus is present.  Cardiovascular: Normal rate and regular rhythm.   Murmur heard. Distant heart sounds, peripheral pulses weak  Pulmonary/Chest: No respiratory distress. He has rales.  Agonal with periods of apnea  Abdominal: He exhibits distension.  hypoactive  Musculoskeletal: He exhibits edema.  Neurological:  sedated  Skin:  + jaundice  Nursing note and vitals reviewed.           Vital Signs: BP 124/70 (BP Location: Right Arm)   Pulse 88   Temp 97.7 F (36.5 C) (Axillary)   Resp (!) 8   Ht '5\' 7"'$  (1.702 m)   Wt 66.4  kg (146 lb 6.2 oz)   SpO2 97%   BMI 22.93 kg/m  SpO2: SpO2: 97 % O2 Device: O2 Device: High Flow Nasal Cannula O2 Flow Rate: O2 Flow Rate (L/min): 15 L/min  Intake/output summary:   Intake/Output Summary (Last 24 hours) at 03/25/16 1051 Last data filed at 03/25/16 1000  Gross per 24 hour  Intake            257.5 ml  Output              200 ml  Net             57.5 ml   LBM: Last BM Date: 03/23/16 Baseline Weight:  Weight: 62.1 kg (137 lb) Most recent weight: Weight: 66.4 kg (146 lb 6.2 oz)       Palliative Assessment/Data: PPS: 10%      Patient Active Problem List   Diagnosis Date Noted  . Terminal care   . Liver failure without hepatic coma ([place])   . Non-small cell carcinoma of lung metastatic to abdomen ([place])   . Advance care planning   . Goals of care, counseling/discussion   . Palliative care by specialist   . Anxiety state   . Shortness of breath   . HCAP (healthcare-associated pneumonia) 03/22/2016  . Acute respiratory failure with hypoxia ([place]) 03/22/2016  . Cholangitis 03/21/2016  . Diarrhea 03/18/2016  . Antineoplastic chemotherapy induced anemia 03/04/2016  . Rash 12/19/2015  . Neck pain 12/19/2015  . Chronic fatigue 11/10/2015  . Bacteremia due to Klebsiella pneumoniae 04/29/2015  . Infection due to Enterobacter cloacae 04/29/2015  . Gram-negative bacteremia 04/25/2015  . Septic shock ([place]) 04/23/2015  . Sepsis ([place])   . Acute kidney injury ([place])   . Abnormality of gait 03/12/2015  . Protein-calorie malnutrition, severe ([place]) 11/26/2014  . Hypoalbuminemia due to protein-calorie malnutrition ([place]) 11/18/2014  . Leukocytosis 11/18/2014  . Dysphagia 11/18/2014  . Dehydration 10/03/2014  . Central line complication 35/00/9381  . Anorexia 10/03/2014  . Weight loss 10/03/2014  . Transaminitis 10/03/2014  . Hyperphosphatemia 10/03/2014  . Encounter for antineoplastic immunotherapy 09/28/2014  . Aspiration pneumonia ([place]) 09/21/2014  . Non-small cell  lung cancer ([place]) 09/21/2014  . 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 Biliary obstruction   . Fatigue 08/27/2014  . Abdominal mass   . CRF (chronic renal failure) 08/07/2014  . Obstructive jaundice 08/07/2014  . Malnutrition of moderate degree ([place]) 08/07/2014  . Obstructive jaundice due to cancer ([place]) 08/06/2014  . Rotator cuff tear, non-traumatic 06/05/2014  . Dizziness and giddiness 04/23/2014  . Tremor 04/23/2014  . Acute on chronic renal failure ([place]) 05/24/2013  . Pancytopenia ([place]) 05/24/2013  . Osteoarthritis of right knee 12/21/2011  . Meniscus, medial, posterior horn derangement 12/21/2011  . Dyspnea 12/04/2010  . Bronchogenic cancer of left lung ([place]) 12/11/2008  . ARTHRITIS 12/10/2008    Palliative Care Assessment & Plan   Patient Profile: 70 y.o. 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 Experienced significant respiratory distress and agitation that is better controlled now. Family requested transition to comfort care and residential hospice placement.   Assessment/Recommendations/Plan   Continue comfort measures  Awaiting residential hospice consult  Goals of Care and Additional Recommendations:  Limitations on Scope of Treatment: Full Comfort Care  Code Status:  DNR  Prognosis:   Hours - Days d/t ARDS, pneumonia in setting of advanced lung cancer and liver failure- planned transition to comfort care  Discharge Planning:  Hospice facility pending bed availability  Care plan was discussed with patient's spouse and nephew.   Thank you for allowing the Palliative Medicine Team to assist in the care of this patient.   Time In: 1000 Time Out: 1030 Total Time 30  mins Prolonged Time Billed No      Greater than 50%  of this time was spent counseling and coordinating care related to the above assessment and plan.  Mariana Kaufman, AGNP-C Palliative Medicine   Please contact Palliative Medicine  Team phone at [phone] for questions and concerns.

## 2016-03-25 NOTE — Progress Notes (Signed)
CSW consulted to assist with Residential Hospice Home placement. CSW met with spouse at bedside. Pt is unable to participate in dc planning. Spouse has requested [facility] for hospice placement. [facility] has been contacted and will contact CSW once a decision has been made.    LCSW [phone] 

## 2016-03-25 NOTE — Discharge Summary (Signed)
Physician Discharge Summary  [name] TTS:[id number] DOB: 12/30/1945 DOA: 03/22/2016  PCP: [name], MD  Admit date: 03/22/2016 Discharge date: 03/25/2016  Time spent: 25* minutes  Recommendations for Outpatient Follow-up:  1. 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 Despite a common bile duct drain and a percutaneous biliary drain he has had rising liver function tests and bilirubin over the last 2 months. He is also have problems with chronic diarrhea andabdominal distention resulting in some mild dyspnea. He was hospitalized from 12/22-12/24 due to sepsis from probable cholangitis and discharged home with home health services with anticipation of setting up hospice after the holiday. He was DO NOT RESUSCITATE/DO NOT INTUBATE. He was doing well after he returned home but woke up suddenly in the middle the night short of breath with a wet cough. He had been running tube feeds at the time. CXR confirmed aspiration pneumonia and he was admitted to stepdown on bipap and broad sprectrum  antibiotics. After readmission, blood culture from his previous admission grew Enterobacter. Repeat blood cultures from this admission are pending. He has required continuous bipap and his CXR is looking increasingly like ARDS. His prognosis is guarded. Palliative care has been consulted to help with symptom management but I suspect he will have an in-hospital death unless he turns around on bipap.   Hospital Course:  1. Acute respiratory failure with hypoxia- secondary to aspiration pneumonia, patient has persistent tachypnea and respiratory distress despite being on BiPAP. Continue Zosyn. Dilaudid 0.5 mg/hr infusion. Ativan 2 mg IV every 4 hours when necessary. Patient will be transferred to residential hospice, [facility]  place today. 2. Enterobacter bacteremia and bile duct infection/cholangitis- it was diagnosed from previous admission, repeat blood cultures are negative to date. Zosyn discontinued. 3. Biliary obstruction resulting from complications of non-small cell lung cancer- INR and GI have nothing further to offer at this time. Patient started on saline Medrol for chemotherapy-induced hepatitis and possibly for colitis induced diarrhea on 1225. LFTs show an upward trend today AST 135, ALP 152, total bili 12.9. 4. Elevated INR- INR is 1.83, improved, likely from liver failure, patient was given vitamin K 5 mg on 03/12/2016. 5. Diarrhea/colitis- patient was started on steroids for possible colitis 6. 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 Leukocytosis-likely from steroids, and underlying aspiration pneumonia  Procedures:  None   Consultations:  none  Discharge Exam: Vitals:   03/25/16 1000 03/25/16 1100  BP:    Pulse: 94 98  Resp: 10 (!) 6    General: Appears in no acute distress  Cardiovascular:  RRR, S1-S2  Respiratory:  clear bilaterally  Discharge Instructions   Discharge Instructions    Diet - low sodium heart healthy     Complete by:  As directed    Increase activity slowly    Complete by:  As directed      Current Discharge Medication List    START taking these medications   Details  acetaminophen (TYLENOL) 325 MG tablet Take 2 tablets (650 mg total) by mouth every 6 (six) hours as needed for mild pain (or Fever >/= 101).    acetaminophen (TYLENOL) 650 MG suppository Place 1 suppository (650 mg total) rectally every 6 (six) hours as needed for mild pain (or Fever >/= 101). Qty: 12 suppository, Refills: 0    glycopyrrolate (ROBINUL) 1 MG tablet Take 1 tablet (1 mg total) by mouth every 4 (four) hours as needed (excessive secretions).    HYDROmorphone (DILAUDID) 2 mg/mL SOLN Inject 1 mg into the vein every 15 (fifteen) minutes as needed (uncontrolled pain, distress or if respiratory rate is greater than 25). Refills: 0    HYDROmorphone 50 mg in sodium chloride 0.9 % 95 mL Inject 0.5 mg/hr into the vein continuous.    ondansetron (ZOFRAN-ODT) 4 MG disintegrating tablet Take 1 tablet (4 mg total) by mouth every 6 (six) hours as needed for nausea. Qty: 20 tablet, Refills: 0      CONTINUE these medications which have NOT CHANGED   Details  hydrOXYzine (ATARAX/VISTARIL) 25 MG tablet Place 25 mg into feeding tube 3 (three) times daily as needed for itching.     LORazepam (ATIVAN) 1 MG tablet Take 0.5-1 mg by mouth every 4 (four) hours as needed for anxiety.      STOP taking these medications     amoxicillin-clavulanate (AUGMENTIN) 875-125 MG tablet      aspirin 325 MG tablet      b complex vitamins tablet      dicyclomine (BENTYL) 20 MG tablet      diphenhydramine-acetaminophen (TYLENOL PM) 25-500 MG TABS tablet      diphenoxylate-atropine (LOMOTIL) 2.5-0.025 MG tablet      metoCLOPramide (REGLAN) 5 MG/5ML solution      midodrine (PROAMATINE) 5 MG tablet      Multiple Vitamin (MULTIVITAMIN WITH MINERALS) TABS tablet      mupirocin ointment (BACTROBAN) 2 %      Nutritional Supplements  (FEEDING SUPPLEMENT, OSMOLITE 1.5 CAL,) LIQD      Nutritional Supplements (TWOCAL HN) LIQD      omeprazole (PRILOSEC) 40 MG capsule      ondansetron (ZOFRAN) 8 MG tablet      oxyCODONE (ROXICODONE) 5 MG/5ML solution      promethazine (PHENERGAN) 25 MG tablet      senna (SENOKOT) 8.6 MG tablet      simethicone (MYLICON) 40 BW/3.8LH drops      triamcinolone ointment (KENALOG) 0.1 %      vitamin C (ASCORBIC ACID) 500 MG tablet        Allergies  Allergen Reactions  . Augmentin [Amoxicillin-Pot Clavulanate] Diarrhea and Other (See Comments)    .Has patient had a PCN reaction causing immediate rash, facial/tongue/throat swelling, SOB or lightheadedness with hypotension: No Has patient had a PCN reaction causing severe rash involving mucus membranes or skin necrosis: No Has patient had a PCN reaction that required hospitalization No Has patient had a PCN reaction occurring within the last 10 years: Yes If all of the above answers are "NO", then may proceed with Cephalosporin use.   Marland Kitchen  Fentanyl Other (See Comments)    Reaction:  Hallucinations   . Scopolamine Other (See Comments)    Reaction:  Hallucinations   . Zolpidem Tartrate Diarrhea  . Morphine And Related Itching  . Carboplatin Itching and Rash  . Levaquin [Levofloxacin] Other (See Comments)    Reaction:  Knee pain       The results of significant diagnostics from this hospitalization (including imaging, microbiology, ancillary and laboratory) are listed below for reference.    Significant Diagnostic Studies: Dg Chest 2 View  Result Date: 03/19/2016 CLINICAL DATA:  Chills jaundice and dehydration EXAM: CHEST  2 VIEW COMPARISON:  10/21/2015, 01/20/2016 . FINDINGS: No acute consolidation or pleural effusion is visualized. Volume loss in the right upper lobe with paramediastinal fibrosis, similar compared to previous CT. Stable heart size. Atherosclerosis. No pneumothorax. Drain in the right upper quadrant IMPRESSION: 1.  No acute infiltrate or consolidation 2. Stable right upper lobe paramediastinal opacity compatible with scarring and volume loss Electronically Signed   By: Donavan Foil M.D.   On: 03/19/2016 17:38   US Abdomen Limited  Result Date: 03/21/2016 CLINICAL DATA:  Elevated liver function tests. EXAM: LIMITED ABDOMEN ULTRASOUND FOR ASCITES TECHNIQUE: Limited ultrasound survey for ascites was performed in all four abdominal quadrants. COMPARISON:  CT of 03/10/2016 FINDINGS: Trace ascites identified, primarily in the right lower quadrant. IMPRESSION: Trace ascites, insufficient for paracentesis. Electronically Signed   By: Abigail Miyamoto M.D.   On: 03/21/2016 16:06   Ir Fluoro Rm 30-60 Min  Result Date: 03/02/2016 INDICATION: 70 year old male with a history of percutaneous gastrostomy tube placement with a retained T tack on CT dated 01/20/2016. EXAM: IR FLOURO RM 0-60 MIN COMPARISON:  CT 01/20/2016 MEDICATIONS: None ANESTHESIA/SEDATION: None FLUOROSCOPY TIME:  Fluoroscopy Time: 0 minutes 42 seconds (6 mGy). COMPLICATIONS: None TECHNIQUE: Informed written consent was obtained from the patient after a thorough discussion of the procedural risks, benefits and alternatives. All questions were addressed. Maximal Sterile Barrier Technique was utilized including caps, mask, sterile gowns, sterile gloves, sterile drape, hand hygiene and skin antiseptic. A timeout was performed prior to the initiation of the procedure. Patient is position in the supine position on the fluoroscopy table. The region surrounding G-tube was prepped and draped in the usual sterile fashion. The skin and subcutaneous tissues at the ostomy site were generously infiltrated 1% lidocaine for local anesthesia. Fluoroscopic survey was then performed at the site of the ostomy, searching for the retained foreign body (retained T tack from prior gastrostomy). This was not present at the ostomy site, and so the rest of the abdomen was surveyed. No evidence  of the T tack on the fluoroscopic survey. At this time, the granulation tissue at the ostomy site was treated with silver nitrate. Dressing was placed. Patient tolerated the procedure well and remained hemodynamically stable throughout. No complications were encountered and no significant blood loss encountered. FINDINGS: Fluoroscopic survey demonstrates no evidence of retained T tack, which seems to have passed through the GI system. Silver nitrate treatment of granulation tissue. IMPRESSION: Status post fluoroscopic survey of the abdomen demonstrating no evidence of retained foreign body on the current study, which seems to have passed through the GI tract. Local treatment of granulation tissue at the ostomy site with silver nitrate. Should this recur, topical treatment with repeat silver nitrate or alternatively a prescription for Kenalog cream, either 0.25% or 1% may be considered. Signed, Dulcy Fanny. [name], DO [facility] Radiology Electronically Signed   By:  Corrie Mckusick D.O.   On: 03/02/2016 16:56   Ct Abdomen W Wo Contrast  Result Date: 03/10/2016 CLINICAL DATA:  Non-small cell lung cancer, metastatic, treated with radiotherapy and chemotherapy. Elevated bilirubin. EXAM: CT ABDOMEN WITHOUT AND WITH CONTRAST TECHNIQUE: Multidetector CT imaging of the abdomen was performed following the standard protocol before and following the bolus administration of intravenous contrast. CONTRAST:  162m ISOVUE-300 IOPAMIDOL (ISOVUE-300) INJECTION 61% COMPARISON:  01/20/2016. FINDINGS: Lower chest: Lung bases show volume loss in the right lower lobe with associated pleural thickening. Heart size normal. Coronary artery calcification. No pericardial or pleural effusion. Hepatobiliary: Mild pneumobilia with a common bile duct stent in place. Liver is otherwise unremarkable. Stones and air are seen in the gallbladder. No biliary ductal dilatation. Pancreas: Negative. Spleen:  Negative. Adrenals/Urinary Tract: Adrenal glands and right kidney are unremarkable. A 1.5 cm cyst is seen in the left kidney. Kidneys are otherwise unremarkable. Stomach/Bowel: Percutaneous gastrostomy. Stomach and visualized portions of the small bowel and colon are otherwise unremarkable. Vascular/Lymphatic: Atherosclerotic calcification of the arterial vasculature. Infrarenal aorta measures up to 2.7 cm. No pathologically enlarged lymph nodes. Other: No free fluid. Mesenteries and peritoneum are unremarkable. Fluid density structure adjacent to the caudate lobe is unchanged. Musculoskeletal: No worrisome lytic or sclerotic lesions. IMPRESSION: 1. No acute findings to explain the patient's elevated bilirubin. Common bile duct stent is in place. 2. Cholelithiasis. 3. Aortic atherosclerosis (ICD10-170.0). Coronary artery calcification. 4. Ectatic abdominal aorta at risk for aneurysm development. Recommend followup by ultrasound in 5 years. This recommendation follows ACR consensus guidelines: White Paper of the ACR Incidental Findings Committee II on Vascular Findings. J Am Coll Radiol 2013; 10:789-794. Electronically Signed   By: MLorin PicketM.D.   On: 03/10/2016 13:50   Dg Chest Port 1 View  Result Date: 03/23/2016 CLINICAL DATA:  Shortness of Breath EXAM: PORTABLE CHEST 1 VIEW COMPARISON:  03/22/2016 FINDINGS: Cardiac shadow is stable. Aortic calcifications are again identified. Patchy ground-glass infiltrate is noted bilaterally worse on the left than the right. This is increased particularly in the left base when compared with the prior exam. Right upper quadrant biliary catheter is noted. IMPRESSION: Increasing ground-glass infiltrate particularly in the left lung base. Electronically Signed   By: MInez CatalinaM.D.   On: 03/23/2016 08:29   Dg Chest Port 1 View  Result Date: 03/22/2016 CLINICAL DATA:  Shortness of breath.  Awoke with cough. EXAM: PORTABLE CHEST 1 VIEW COMPARISON:  Radiographs  03/19/2016 FINDINGS: New from prior exam patchy and ground-glass opacities in the left upper lung zone and right perihilar lung. There is probable pleural thickening or fluid laterally in the lower right lung. Chronic changes in the right paramediastinal region are again seen. No evidence of pneumothorax. Normal heart size. Stable osseous structures. IMPRESSION: Patchy and ground-glass opacities in the right perihilar lung and left upper lobe, may be pneumonia or aspiration, new from exam 3 days prior. Electronically Signed   By: MJeb LeveringM.D.   On: 03/22/2016 05:55   Dg Abd Portable 1v  Result Date: 03/21/2016 CLINICAL DATA:  Abdominal distention. EXAM: PORTABLE ABDOMEN - 1 VIEW COMPARISON:  CT 03/10/2016 FINDINGS: Percutaneous gastrostomy tube over the stomach in the left upper quadrant. Biliary stent over the right upper quadrant unchanged. Interval placement of pigtail drainage catheter over the right upper quadrant which pigtail adjacent the distal end of the biliary stent. Bowel gas pattern demonstrates several air-filled nondilated large and small bowel loops. No free peritoneal air. There are degenerative  changes of the spine and hips. IMPRESSION: Nonobstructive bowel gas pattern. Tubes and lines as described. Electronically Signed   By: Marin Olp M.D.   On: 03/21/2016 11:33   Ir Int Lianne Cure Biliary Drain With Cholangiogram  Result Date: 03/12/2016 INDICATION: 70 year old male with a history of hyperbilirubinemia. Potentially the lab abnormality is secondary to medical liver disease. Treatment would defer or if this was secondary to medical liver disease and not obstructive. He has been referred for diagnostic PTC and possible drainage EXAM: IR INT-EXT BILIARY DRAIN W/ CHOLANGIOGRAM MEDICATIONS: Clindamycin; The antibiotic was administered within an appropriate time frame prior to the initiation of the procedure. ANESTHESIA/SEDATION: Moderate (conscious) sedation was employed during this  procedure. A total of Versed 4.0 mg and 2.0 mg Dilaudid mcg was administered intravenously. Moderate Sedation Time: 49 minutes. The patient's level of consciousness and vital signs were monitored continuously by radiology nursing throughout the procedure under my direct supervision. FLUOROSCOPY TIME:  Fluoroscopy Time: 5 minutes 0 seconds COMPLICATIONS: None PROCEDURE: The procedure, risks, benefits, and alternatives were explained to the patient and the patient's family. A complete informed consent was performed, with risk benefit analysis. Specific risks that were discussed for the procedure include bleeding, infection, biliary sepsis, IC use day, organ injury, need for further procedure, need for further surgery, long-term drain placement, cardiopulmonary collapse, death. Questions regarding the procedure were encouraged and answered. The patient understands and consents to the procedure. Patient is position in supine position on the fluoroscopy table, and the upper abdomen was prepped and draped in the usual sterile fashion. Maximum barrier sterile technique with sterile gowns and gloves were used for the procedure. A timeout was performed prior to the initiation of the procedure. Local anesthesia was provided with 1% lidocaine with epinephrine. 1% lidocaine was used for local anesthesia, with generous infiltration of the skin and subcutaneous tissues in and inter left costal location. A Chiba needle was advanced under fluoroscopic guidance into the right liver lobe. Upon withdrawal, contrast was infused identifying biliary system. Once the tip of the needle was confirmed within the biliary system by injecting small aliquots of contrast, images were stored of the biliary system after partially opacifying the biliary tree via the needle. A second access was required for access into the biliary system, from intercostal location. Once the tip of this needle was confirmed within the biliary system, an 018 wire was  advanced centrally. The needle was removed, a small incision was made with an 11 blade scalpel, and then a triaxial Accustick system was advanced into the biliary system. The metal stiffener and dilator were removed, we confirmed placement with contrast infusion. An 035 wire was then placed through the Accustick sheath, parallel to the indwelling plastic stent. Eight [place] biliary drain was then placed over the wire. A single side hole was added approximately 1 cm beyond the radiopaque marker. Contrast injection confirmed location. The patient tolerated the procedure well and remained hemodynamically stable throughout. No complications were encountered and no significant blood loss was encountered. IMPRESSION: Status post percutaneous transhepatic cholangiogram with placement of internal/ external biliary drain with a modified 8 [place] biliary drain. Signed, Dulcy Fanny. [name] DO [facility] Radiology PLAN: The patient will be observing his future appointments with Oncology. We will plan on exchanging the internal external drain in 2-3 months. At that time, it may be useful to attempt a foreign body retrieval through a sheath, with attempt at removal of the of the stent. Electronically Signed  By: Corrie Mckusick D.O.   On: 03/12/2016 16:42   Ir Exchange Biliary Drain  Result Date: 03/19/2016 INDICATION: Occluded retracted right internal external biliary drain EXAM: IR EXCHANGE BILARY DRAIN MEDICATIONS: Patient is already on oral antibiotics. ANESTHESIA/SEDATION: None. FLUOROSCOPY TIME:  Fluoroscopy Time: 4 minutes 6 seconds (61 mGy). COMPLICATIONS: None immediate. PROCEDURE: Informed written consent was obtained from the patient after a thorough discussion of the procedural risks, benefits and alternatives. All questions were addressed. Maximal Sterile Barrier Technique was utilized including caps, mask, sterile gowns, sterile gloves, sterile drape, hand hygiene  and skin antiseptic. A timeout was performed prior to the initiation of the procedure. Initial fluoroscopic imaging and injection of the right internal external biliary drain demonstrates catheter retraction into the common bile duct. 8 [place] biliary drain is also occluded. Biliary tree is not opacified. Under sterile conditions and local anesthesia, the existing 8 [place] biliary drain was removed over a Glidewire. Access was readvanced into the duodenum. A new 8 [place] biliary drain was advanced with the retention loop formed in the duodenum. Contrast injection confirms appropriate position and drainage of the biliary system. Catheter secured with a Prolene suture and a sterile dressing. No immediate complication. Images obtained for documentation. Patient tolerated the procedure well. IMPRESSION: Successful fluoroscopic exchange and reposition of the 8 [place] right external internal biliary drain. Electronically Signed   By: Jerilynn Mages.  Shick M.D.   On: 03/19/2016 12:35   Ir Radiologist Eval & Mgmt  Result Date: 03/17/2016 Please refer to notes tab for details about interventional procedure. (Op Note)  US Abdomen Limited Ruq  Result Date: 03/21/2016 CLINICAL DATA:  Elevated LFTs/bilirubin EXAM: US ABDOMEN LIMITED - RIGHT UPPER QUADRANT COMPARISON:  CT abdomen/ pelvis dated 03/10/2016 FINDINGS: Gallbladder: Gallbladder is poorly visualized with suspected gallstones and dirty shadowing related to gas within the gallbladder lumen. This is better visualized on CT. Common bile duct: Diameter: 4 mm Liver: Coarse hepatic echotexture.  Pneumobilia. IMPRESSION: Pneumobilia with gas within the gallbladder lumen, likely related to the patient's known indwelling bile duct stent. Cholelithiasis. No gallbladder distention or additional findings specific for acute cholecystitis. Electronically Signed   By: Julian Hy M.D.   On: 03/21/2016 14:56    Microbiology: Recent Results (from the past 240 hour(s))  Culture,  Blood     Status: None   Collection Time: 03/19/16 12:33 PM  Result Value Ref Range Status   BLOOD CULTURE, ROUTINE Final report  Final   RESULT 1 Comment  Final    Comment: No aerobic or anaerobic growth in five days.  Culture, blood (single) w Reflex to ID Panel     Status: Abnormal   Collection Time: 03/19/16 12:38 PM  Result Value Ref Range Status   BLOOD CULTURE, ROUTINE Final report (A)  Final   RESULT 1 Comment (A)  Final    Comment: Enterobacter cloacae complex Recovered from anaerobic bottle only. Ertapenem Nonsusceptible    RESULT 2 Comment  Final    Comment: No aerobic or anaerobic growth in five days. Recovered from aerobic bottle only.    ANTIMICROBIAL SUSCEPTIBILITY Comment  Final    Comment:       ** S = Susceptible; I = Intermediate; R = Resistant **                    P = Positive; N = Negative             MICS are expressed in micrograms per mL  Antibiotic                 RSLT#1    RSLT#2    RSLT#3    RSLT#4 Amoxicillin/Clavulanic Acid    R Cefazolin                      R Cefepime                       R Ceftriaxone                    R Cefuroxime                     R Ciprofloxacin                  S Gentamicin                     S Imipenem                       S Levofloxacin                   S Piperacillin                   R Tetracycline                   R Tobramycin                     S Trimethoprim/Sulfa             S   Urine culture     Status: None   Collection Time: 03/19/16  5:47 PM  Result Value Ref Range Status   Specimen Description URINE, RANDOM  Final   Special Requests NONE  Final   Culture NO GROWTH Performed at [facility]   Final   Report Status 03/21/2016 FINAL  Final  Gastrointestinal Panel by PCR , Stool     Status: None   Collection Time: 03/19/16  6:12 PM  Result Value Ref Range Status   Campylobacter species NOT DETECTED NOT DETECTED Final   Plesimonas shigelloides NOT DETECTED NOT DETECTED Final    Salmonella species NOT DETECTED NOT DETECTED Final   Yersinia enterocolitica NOT DETECTED NOT DETECTED Final   Vibrio species NOT DETECTED NOT DETECTED Final   Vibrio cholerae NOT DETECTED NOT DETECTED Final   Enteroaggregative E coli (EAEC) NOT DETECTED NOT DETECTED Final   Enteropathogenic E coli (EPEC) NOT DETECTED NOT DETECTED Final   Enterotoxigenic E coli (ETEC) NOT DETECTED NOT DETECTED Final   Shiga like toxin producing E coli (STEC) NOT DETECTED NOT DETECTED Final   Shigella/Enteroinvasive E coli (EIEC) NOT DETECTED NOT DETECTED Final   Cryptosporidium NOT DETECTED NOT DETECTED Final   Cyclospora cayetanensis NOT DETECTED NOT DETECTED Final   Entamoeba histolytica NOT DETECTED NOT DETECTED Final   Giardia lamblia NOT DETECTED NOT DETECTED Final   Adenovirus F40/41 NOT DETECTED NOT DETECTED Final   Astrovirus NOT DETECTED NOT DETECTED Final   Norovirus GI/GII NOT DETECTED NOT DETECTED Final   Rotavirus A NOT DETECTED NOT DETECTED Final   Sapovirus (I, II, IV, and V) NOT DETECTED NOT DETECTED Final  Body fluid culture     Status: None   Collection Time: 03/20/16  1:50 PM  Result Value Ref Range Status   Specimen Description BILE  Final   Special Requests Normal  Final   Gram Stain   Final    NO WBC SEEN ABUNDANT GRAM NEGATIVE RODS MODERATE GRAM POSITIVE COCCI IN PAIRS RARE GRAM POSITIVE RODS Performed at [facility]    Culture MULTIPLE ORGANISMS PRESENT, NONE PREDOMINANT  Final   Report Status 03/22/2016 FINAL  Final  Blood Culture (routine x 2)     Status: None (Preliminary result)   Collection Time: 03/22/16  5:58 AM  Result Value Ref Range Status   Specimen Description BLOOD LEFT ANTECUBITAL  Final   Special Requests IN PEDIATRIC BOTTLE 4 CC  Final   Culture   Final    NO GROWTH 3 DAYS Performed at [facility]    Report Status PENDING  Incomplete  Blood Culture (routine x 2)     Status: None (Preliminary result)   Collection Time: 03/22/16  5:58 AM   Result Value Ref Range Status   Specimen Description BLOOD LEFT HAND  Final   Special Requests BOTTLES DRAWN AEROBIC AND ANAEROBIC 5 CC  Final   Culture   Final    NO GROWTH 3 DAYS Performed at [facility]    Report Status PENDING  Incomplete  Urine culture     Status: None   Collection Time: 03/22/16  7:06 AM  Result Value Ref Range Status   Specimen Description URINE, CLEAN CATCH  Final   Special Requests NONE  Final   Culture NO GROWTH Performed at [facility]   Final   Report Status 03/23/2016 FINAL  Final  MRSA PCR Screening     Status: None   Collection Time: 03/22/16  3:22 PM  Result Value Ref Range Status   MRSA by PCR NEGATIVE NEGATIVE Final    Comment:        The GeneXpert MRSA Assay (FDA approved for NASAL specimens only), is one component of a comprehensive MRSA colonization surveillance program. It is not intended to diagnose MRSA infection nor to guide or monitor treatment for MRSA infections.   Culture, sputum-assessment     Status: None   Collection Time: 03/22/16  5:11 PM  Result Value Ref Range Status   Specimen Description SPUTUM  Final   Special Requests Normal  Final   Sputum evaluation THIS SPECIMEN IS ACCEPTABLE FOR SPUTUM CULTURE  Final   Report Status 03/22/2016 FINAL  Final  Culture, respiratory (NON-Expectorated)     Status: None   Collection Time: 03/22/16  5:11 PM  Result Value Ref Range Status   Specimen Description SPUTUM  Final   Special Requests Normal Reflexed from [id number]  Final   Gram Stain   Final    FEW WBC PRESENT,BOTH PMN AND MONONUCLEAR MODERATE SQUAMOUS EPITHELIAL CELLS PRESENT NO ORGANISMS SEEN    Culture   Final    Consistent with normal respiratory flora. Performed at [facility]    Report Status 03/25/2016 FINAL  Final     Labs: Basic Metabolic Panel:  Recent Labs Lab 03/20/16 0440 03/21/16 0422 03/22/16 0511 03/23/16 0307 03/24/16 0346  NA 141 140 143 146* 145  K 3.9 4.2 4.2 3.5  4.1  CL 116* 113* 113* 120* 120*  CO2 18* 19* 20* 17* 17*  GLUCOSE 143* 154* 142* 173* 154*  BUN 42* 31* 32* 32* 35*  CREATININE 1.36* 1.06 1.27* 1.11 0.89  CALCIUM 8.1* 8.1* 8.7* 8.3* 8.6*   Liver Function Tests:  Recent Labs Lab 03/20/16 0440 03/21/16 0422 03/22/16 0511 03/23/16 0307 03/24/16  0346  AST 93* 118* 127* 112* 135*  ALT 83* 100* 120* 116* 152*  ALKPHOS 236* 328* 373* 291* 299*  BILITOT 10.1* 10.2* 13.8* 11.4* 12.9*  PROT 5.0* 5.2* 6.0* 5.4* 6.1*  ALBUMIN 2.5* 2.5* 2.6* 2.4* 2.7*    Recent Labs Lab 03/19/16 1805  LIPASE 13    Recent Labs Lab 03/21/16 0917  AMMONIA <9*   CBC:  Recent Labs Lab 03/19/16 1232 03/20/16 0440 03/21/16 0422 03/22/16 0511 03/23/16 0307 03/24/16 0346  WBC 12.6* 6.8 6.3 16.9* 15.5* 21.7*  NEUTROABS 10.2*  --   --  14.9*  --   --   HGB 11.0* 9.1* 9.4* 10.6* 9.8* 11.7*  HCT 31.4* 26.0* 27.2* 30.1* 28.3* 35.4*  MCV 101.9* 101.6* 101.9* 102.4* 103.3* 105.7*  PLT 149 113* 117* 152 142* 196   Cardiac Enzymes:  Recent Labs Lab 03/22/16 0511  TROPONINI 0.04*   BNP: BNP (last 3 results)  Recent Labs  03/22/16 0511  BNP 298.3*        Signed:  Oswald Hillock MD.  [facility] 03/25/2016, 3:48 PM

## 2016-03-25 NOTE — Progress Notes (Signed)
Pt was on Dilaudid drip before being transferred to [facility]. Wasted 30 ML in sink with Zoe Suggs.

## 2016-03-27 LAB — CULTURE, BLOOD (ROUTINE X 2)
Culture: NO GROWTH
Culture: NO GROWTH

## 2016-03-30 ENCOUNTER — Telehealth: Payer: Self-pay | Admitting: Internal Medicine

## 2016-03-30 NOTE — Telephone Encounter (Signed)
Family member called to cxl pt appts due to pt passing away

## 2016-04-01 ENCOUNTER — Ambulatory Visit: Payer: Medicare Other

## 2016-04-01 ENCOUNTER — Other Ambulatory Visit: Payer: Medicare Other

## 2016-04-01 ENCOUNTER — Encounter: Payer: Medicare Other | Admitting: Nutrition

## 2016-04-01 ENCOUNTER — Ambulatory Visit: Payer: Medicare Other | Admitting: Internal Medicine

## 2016-04-02 ENCOUNTER — Encounter: Payer: Self-pay | Admitting: Interventional Radiology

## 2016-04-16 ENCOUNTER — Telehealth: Payer: Self-pay | Admitting: Medical Oncology

## 2016-04-16 NOTE — Telephone Encounter (Signed)
Spoke to wife and expressed my sympathy .

## 2016-04-22 ENCOUNTER — Ambulatory Visit: Payer: Medicare Other | Admitting: Internal Medicine

## 2016-04-22 ENCOUNTER — Ambulatory Visit: Payer: Medicare Other

## 2016-04-22 ENCOUNTER — Other Ambulatory Visit: Payer: Medicare Other

## 2016-04-29 DEATH — deceased

## 2016-05-05 ENCOUNTER — Other Ambulatory Visit (HOSPITAL_COMMUNITY): Payer: Self-pay | Admitting: Interventional Radiology

## 2016-09-04 IMAGING — CT CT CHEST W/O CM
2 of 4 series · 16 of 46 positions shown, 18 images · non-contrast
Comparison: 10/30/2013

CLINICAL DATA: Right-sided lung cancer diagnosed in 7494. Less
chemotherapy earlier this year. Restaging.Malignant neoplasm of
upper lobe of lung E05.68 ([id number])

EXAM:
CT CHEST, ABDOMEN AND PELVIS WITHOUT CONTRAST
TECHNIQUE: Multidetector CT imaging of the chest, abdomen and pelvis was
performed following the standard protocol without IV contrast.

[Series 2: cap w/o w/o st · axial · non-contrast · 0.80mm/px · z∈[+662,+1242]mm · 13 of 130 slices shown, 15 images]
[im 7/130  soft-tissue]
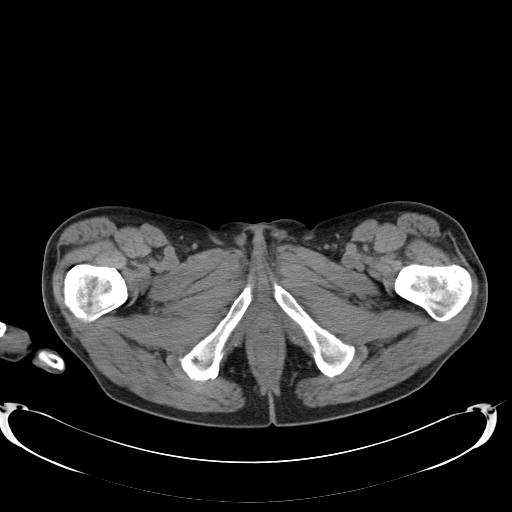
[im 7/130  bone]
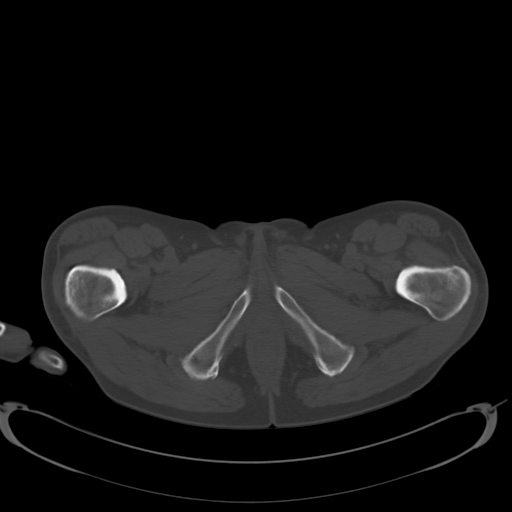
[im 20/130  soft-tissue]
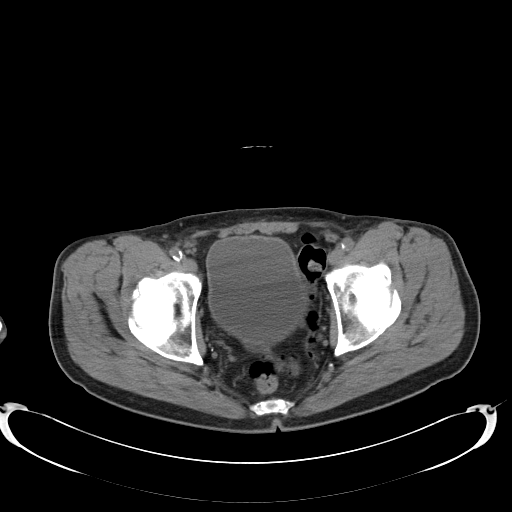
[im 26/130  soft-tissue]
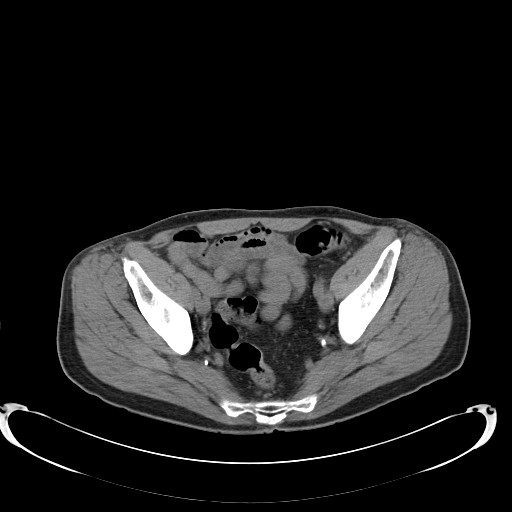
[im 39/130  soft-tissue]
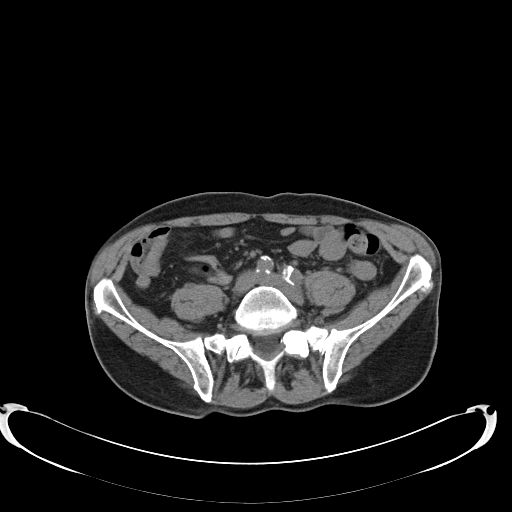
[im 46/130  soft-tissue]
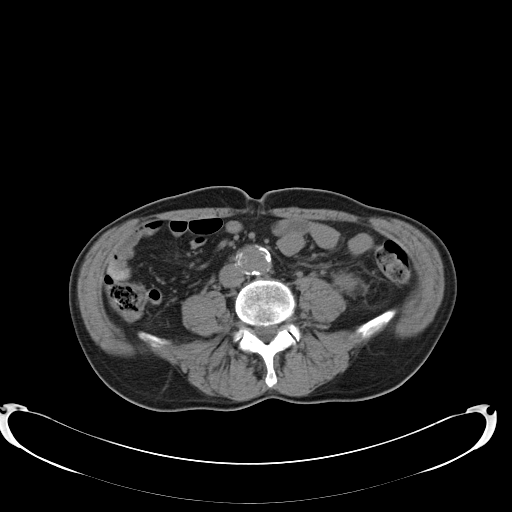
[im 59/130  soft-tissue]
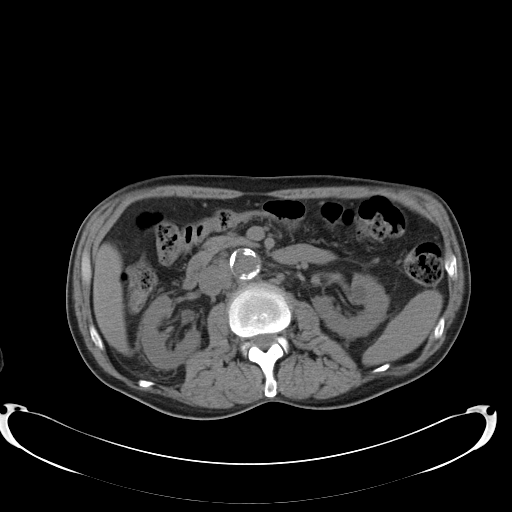
[im 65/130  soft-tissue]
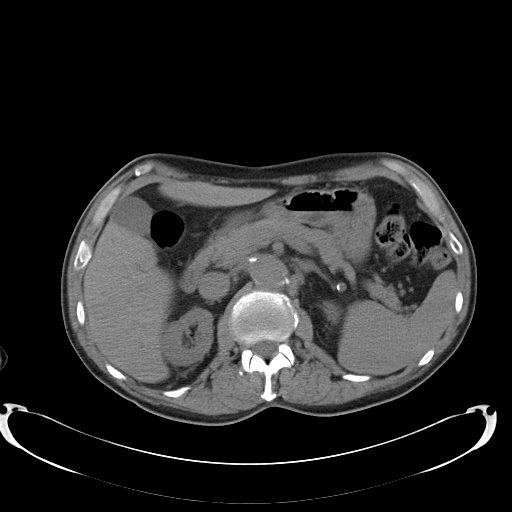
[im 71/130  soft-tissue]
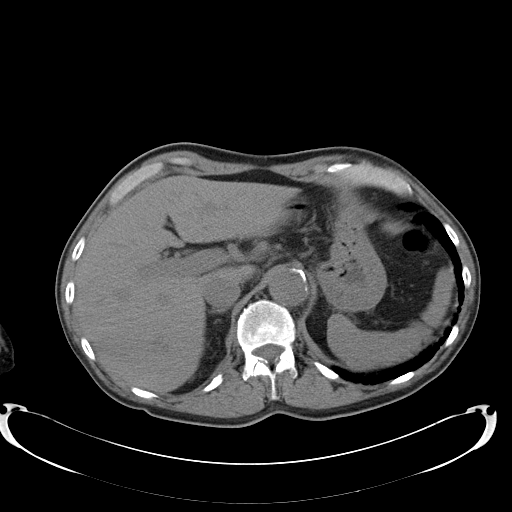
[im 84/130  soft-tissue]
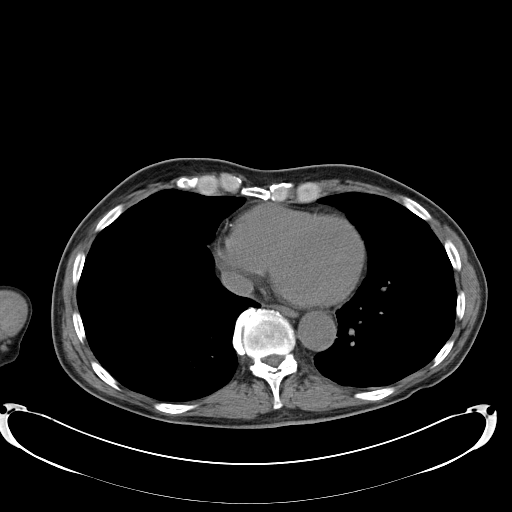
[im 84/130  bone]
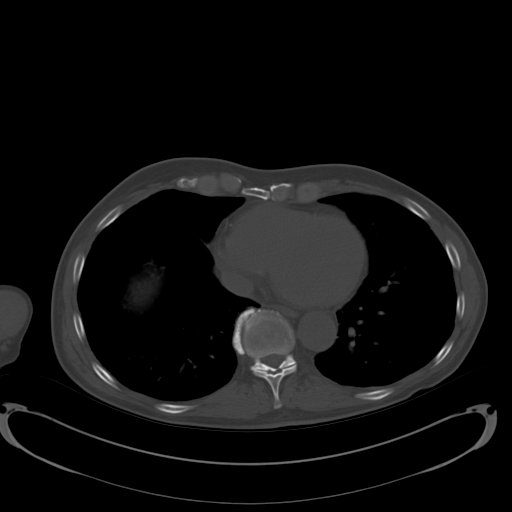
[im 91/130  soft-tissue]
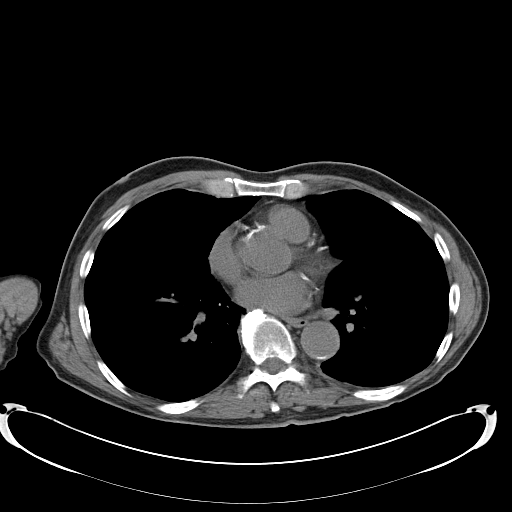
[im 104/130  soft-tissue]
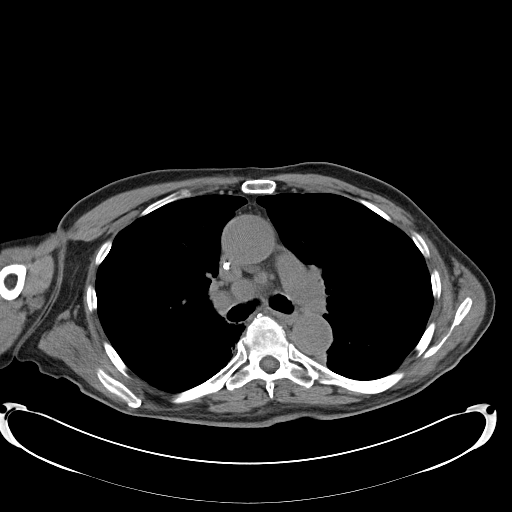
[im 110/130  soft-tissue]
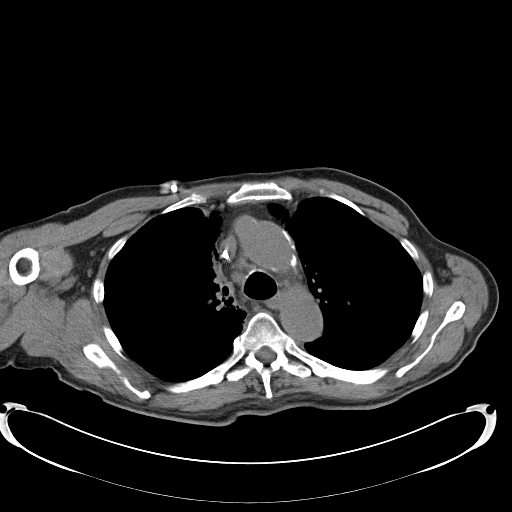
[im 123/130  soft-tissue]
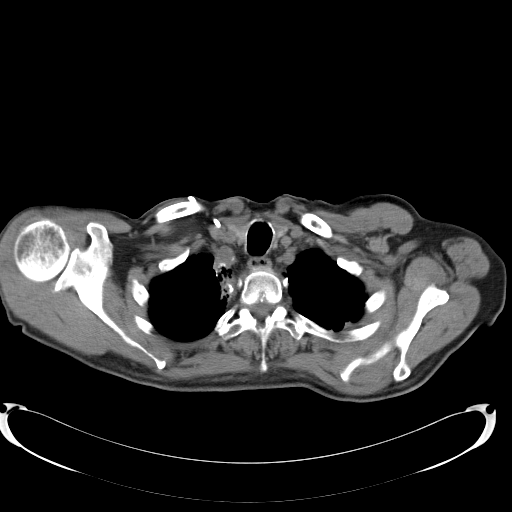

[Series 602: <mpr thick range> · coronal · 1.26mm/px · 3 of 71 slices shown]
[im 32/71  soft-tissue]
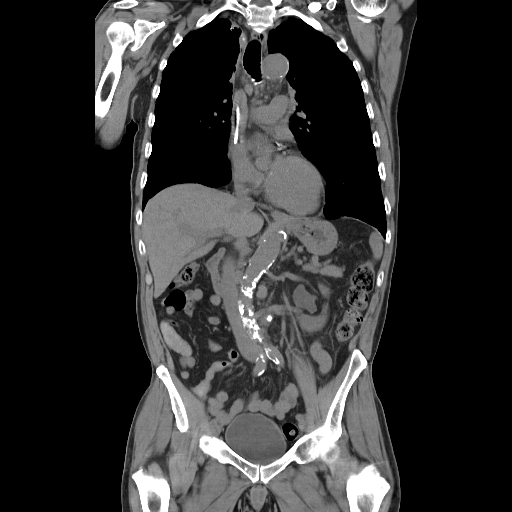
[im 39/71  soft-tissue]
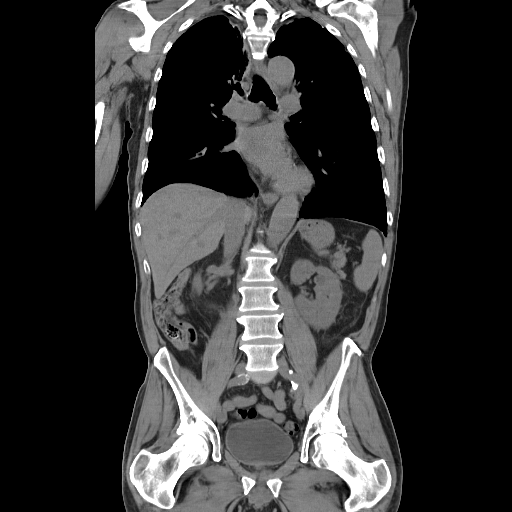
[im 47/71  soft-tissue]
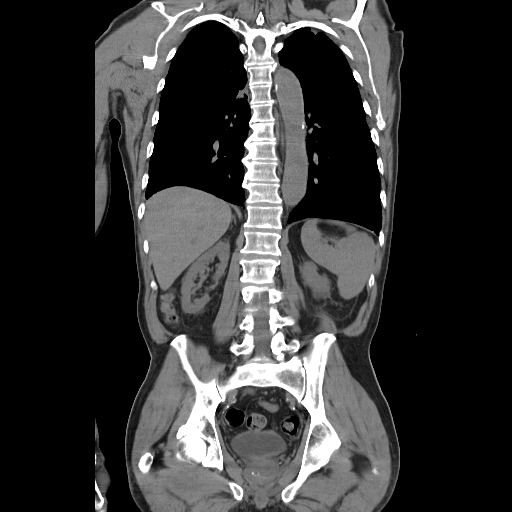

[16 of 46 positions shown; findings below may reference images not displayed]

FINDINGS: CT CHEST FINDINGS

Lungs/Pleura: Mild paraseptal and centrilobular emphysema.

Left apical pleural parenchymal scarring.

Calcified posterior left lower lobe granuloma on image 49.

Right paramediastinal radiation fibrosis which is not significantly
changed.

No pleural fluid.

Heart/Mediastinum: A left supraclavicular node measures 7 mm and is
unchanged on image 7. Not pathologic by size criteria. Right-sided
Port-A-Cath which terminates at the low SVC versus cavoatrial
junction.

Aortic and branch vessel atherosclerosis. Normal heart size, without
pericardial effusion. Multivessel coronary artery atherosclerosis.
No change in a 1.0 cm right paratracheal node on image 19.

Hilar regions poorly evaluated without intravenous contrast.

CT ABDOMEN AND PELVIS FINDINGS

Hepatobiliary: Normal liver and gallbladder, without biliary ductal
dilatation.

Pancreas: Normal, without mass or pancreatic ductal dilatation.

Spleen: Normal

Adrenals/Urinary tract: Normal adrenal glands. Mild renal cortical
thinning. An interpolar left renal cyst. No hydronephrosis. No
bladder calculi.

Stomach/Bowel: Normal stomach, without wall thickening. Normal colon
and terminal ileum. Normal small bowel without abdominal ascites.

Vascular/Lymphatic: Advanced aortic and branch vessel
atherosclerosis. Left periaortic node measures 1.3 cm on image 82
versus 1.1 cm on the prior.

A retrocaval node measures 8 mm on image 76 versus similar on the
prior.

More cephalad left periaortic node measures 1.2 cm on image 75 and
is newly enlarged since the prior exam. Prominent aortic caval node
on image 71.

No pelvic adenopathy.

Reproductive: Mild prostatomegaly.

Other:  No significant free fluid.

Musculoskeletal: No focal osseous abnormality.
IMPRESSION: CT CHEST IMPRESSION

1. No significant change in right paramediastinal radiation
fibrosis.
2. Similar borderline mediastinal adenopathy. Indeterminate.
Recommend attention on follow-up.
3.  Atherosclerosis, including within the coronary arteries.

CT ABDOMEN AND PELVIS IMPRESSION

1. Progression of retroperitoneal adenopathy. As per the 04/02/2013
report, this was hypermetabolic on prior PET. This is therefore
suspicious for metastatic disease. A lymphoproliferative process
could look similar.
2. No other areas of metastatic disease identified within the
abdomen or pelvis.
3. Advanced atherosclerosis.
4. Mild prostatomegaly.

## 2017-11-11 ENCOUNTER — Other Ambulatory Visit: Payer: Self-pay | Admitting: Nurse Practitioner
